# Patient Record
Sex: Female | Born: 1965
Health system: Southern US, Community
[De-identification: ages and names within clinical notes are randomized; demographics above are authoritative.]

## PROBLEM LIST (undated history)

## (undated) DIAGNOSIS — K909 Intestinal malabsorption, unspecified: Secondary | ICD-10-CM

## (undated) DIAGNOSIS — J189 Pneumonia, unspecified organism: Secondary | ICD-10-CM

## (undated) DIAGNOSIS — Z923 Personal history of irradiation: Secondary | ICD-10-CM

## (undated) DIAGNOSIS — K219 Gastro-esophageal reflux disease without esophagitis: Secondary | ICD-10-CM

## (undated) DIAGNOSIS — N83209 Unspecified ovarian cyst, unspecified side: Secondary | ICD-10-CM

## (undated) DIAGNOSIS — I10 Essential (primary) hypertension: Secondary | ICD-10-CM

## (undated) DIAGNOSIS — C169 Malignant neoplasm of stomach, unspecified: Secondary | ICD-10-CM

## (undated) DIAGNOSIS — Z7969 Long term (current) use of other immunomodulators and immunosuppressants: Secondary | ICD-10-CM

## (undated) DIAGNOSIS — Z8719 Personal history of other diseases of the digestive system: Secondary | ICD-10-CM

## (undated) DIAGNOSIS — Z79899 Other long term (current) drug therapy: Secondary | ICD-10-CM

## (undated) DIAGNOSIS — Z972 Presence of dental prosthetic device (complete) (partial): Secondary | ICD-10-CM

## (undated) DIAGNOSIS — D500 Iron deficiency anemia secondary to blood loss (chronic): Secondary | ICD-10-CM

## (undated) DIAGNOSIS — K2270 Barrett's esophagus without dysplasia: Secondary | ICD-10-CM

## (undated) DIAGNOSIS — F419 Anxiety disorder, unspecified: Secondary | ICD-10-CM

## (undated) DIAGNOSIS — M940 Chondrocostal junction syndrome [Tietze]: Secondary | ICD-10-CM

## (undated) DIAGNOSIS — A159 Respiratory tuberculosis unspecified: Secondary | ICD-10-CM

## (undated) DIAGNOSIS — Z809 Family history of malignant neoplasm, unspecified: Secondary | ICD-10-CM

## (undated) DIAGNOSIS — R011 Cardiac murmur, unspecified: Secondary | ICD-10-CM

## (undated) DIAGNOSIS — R51 Headache: Secondary | ICD-10-CM

## (undated) DIAGNOSIS — I2699 Other pulmonary embolism without acute cor pulmonale: Secondary | ICD-10-CM

## (undated) DIAGNOSIS — M1990 Unspecified osteoarthritis, unspecified site: Secondary | ICD-10-CM

## (undated) HISTORY — PX: GASTROSTOMY: SHX151

## (undated) HISTORY — DX: Unspecified ovarian cyst, unspecified side: N83.209

## (undated) HISTORY — DX: Family history of malignant neoplasm, unspecified: Z80.9

## (undated) HISTORY — DX: Chondrocostal junction syndrome (tietze): M94.0

## (undated) HISTORY — PX: TONSILLECTOMY: SUR1361

## (undated) HISTORY — DX: Barrett's esophagus without dysplasia: K22.70

## (undated) HISTORY — PX: UPPER GASTROINTESTINAL ENDOSCOPY: SHX188

## (undated) HISTORY — PX: ABDOMINAL HYSTERECTOMY: SHX81

## (undated) HISTORY — DX: Essential (primary) hypertension: I10

## (undated) HISTORY — PX: OOPHORECTOMY: SHX86

## (undated) HISTORY — DX: Gastro-esophageal reflux disease without esophagitis: K21.9

## (undated) HISTORY — DX: Headache: R51

## (undated) HISTORY — PX: TUBAL LIGATION: SHX77

## (undated) HISTORY — DX: Anxiety disorder, unspecified: F41.9

## (undated) HISTORY — DX: Intestinal malabsorption, unspecified: K90.9

## (undated) HISTORY — DX: Iron deficiency anemia secondary to blood loss (chronic): D50.0

---

## 1998-04-23 ENCOUNTER — Emergency Department (HOSPITAL_COMMUNITY): Admission: EM | Admit: 1998-04-23 | Discharge: 1998-04-23 | Payer: Self-pay | Admitting: Emergency Medicine

## 1998-05-18 ENCOUNTER — Ambulatory Visit: Admission: RE | Admit: 1998-05-18 | Discharge: 1998-05-18 | Payer: Self-pay | Admitting: *Deleted

## 1998-05-18 ENCOUNTER — Encounter: Payer: Self-pay | Admitting: *Deleted

## 2009-02-21 DIAGNOSIS — R51 Headache: Secondary | ICD-10-CM

## 2009-02-21 HISTORY — DX: Headache: R51

## 2011-12-27 ENCOUNTER — Encounter: Payer: Self-pay | Admitting: Cardiology

## 2012-01-20 ENCOUNTER — Encounter: Payer: Self-pay | Admitting: *Deleted

## 2012-01-23 ENCOUNTER — Encounter: Payer: Self-pay | Admitting: Cardiology

## 2012-01-23 ENCOUNTER — Ambulatory Visit (INDEPENDENT_AMBULATORY_CARE_PROVIDER_SITE_OTHER): Payer: Self-pay | Admitting: Cardiology

## 2012-01-23 VITALS — BP 118/72 | HR 66 | Ht 60.0 in | Wt 161.0 lb

## 2012-01-23 DIAGNOSIS — R011 Cardiac murmur, unspecified: Secondary | ICD-10-CM

## 2012-01-23 DIAGNOSIS — I10 Essential (primary) hypertension: Secondary | ICD-10-CM

## 2012-01-23 DIAGNOSIS — R079 Chest pain, unspecified: Secondary | ICD-10-CM

## 2012-01-23 NOTE — Assessment & Plan Note (Signed)
Symptoms atypical and not consistent with cardiac pain. No further ischemia evaluation. We will obtain records from [facility] concerning recent admission and stress test. Symptoms are most consistent with costochondritis. I have recommended a short course of anti-inflammatory and followup with her primary care.

## 2012-01-23 NOTE — Patient Instructions (Addendum)
Your physician recommends that you schedule a follow-up appointment in: AS NEEDED PENDING TEST RESULTS  Your physician has requested that you have an echocardiogram. Echocardiography is a painless test that uses sound waves to create images of your heart. It provides your doctor with information about the size and shape of your heart and how well your heart's chambers and valves are working. This procedure takes approximately one hour. There are no restrictions for this procedure.    

## 2012-01-23 NOTE — Assessment & Plan Note (Signed)
Blood pressure controlled. Continue present medications. 

## 2012-01-23 NOTE — Assessment & Plan Note (Signed)
Probable flow murmur. Schedule echo.

## 2012-01-23 NOTE — Progress Notes (Signed)
HPI: 46 year old female with no prior cardiac history for evaluation of chest pain. Patient has had occasional chest pain since 1999. Previous episodes were attributed to anxiety by her report. In October she was admitted to [facility] with chest pain. Patient apparently ruled out and had a negative stress test but records are not available. Her pain is in the left upper chest with radiation to her axillary area bilaterally. It increases when she wears a tight fitting bra. It improves with pain medications. He can last an hour at a time. It is not related to position or exertion. She has some dyspnea on exertion. Because of the above we were asked to evaluate.  Current Outpatient Prescriptions  Medication Sig Dispense Refill  . acetaminophen (TYLENOL) 325 MG tablet Take 650 mg by mouth every 6 (six) hours as needed.      Marland Kitchen aspirin 81 MG tablet Take 81 mg by mouth daily.      . carvedilol (COREG) 6.25 MG tablet Take 6.25 mg by mouth 2 (two) times daily with a meal.      . naproxen (NAPROSYN) 500 MG tablet Take 500 mg by mouth 2 (two) times daily with a meal.      . omeprazole (PRILOSEC) 40 MG capsule Take 40 mg by mouth daily.      . SUMAtriptan Succinate (SUMAVEL DOSEPRO) 6 MG/0.5ML DEVI Inject into the skin.        No Known Allergies  Past Medical History  Diagnosis Date  . Headache 2011    Migraines  . Esophageal reflux     On omeprazole  . Costochondritis   . Hypertension     started around 2011    Past Surgical History  Procedure Date  . Oophorectomy     Around 1985 left ovary removal  . Tonsillectomy     Around 1994  . Tubal ligation     History   Social History  . Marital Status: Married    Spouse Name: N/A    Number of Children: 2  . Years of Education: N/A   Occupational History  .  [employer]   Social History Main Topics  . Smoking status: Former Games developer  . Smokeless tobacco: Not on file  . Alcohol Use: Yes     Comment: Occasional  .  Drug Use: Not on file  . Sexually Active: Not on file   Other Topics Concern  . Not on file   Social History Narrative  . No narrative on file    Family History  Problem Relation Age of Onset  . Hypertension Mother   . Diabetes Mellitus II Mother   . Hypertension Sister   . Hypertension Brother   . Heart failure Maternal Grandmother   . Hypertension Maternal Grandfather     ROS: no fevers or chills, productive cough, hemoptysis, dysphasia, odynophagia, melena, hematochezia, dysuria, hematuria, rash, seizure activity, orthopnea, PND, pedal edema, claudication. Remaining systems are negative.  Physical Exam:   Blood pressure 118/72, pulse 66, height 5' (1.524 m), weight 161 lb (73.029 kg), SpO2 99.00%.  General:  Well developed/well nourished in NAD Skin warm/dry Patient not depressed No peripheral clubbing Back-normal HEENT-normal/normal eyelids Neck supple/normal carotid upstroke bilaterally; no bruits; no JVD; no thyromegaly chest - CTA/ normal expansion CV - RRR/normal S1 and S2; no rubs or gallops;  PMI nondisplaced, 1/6 systolic ejection murmur Abdomen -NT/ND, no HSM, no mass, + bowel sounds, no bruit 2+ femoral pulses, no bruits Ext-no edema, chords, 2+  DP Neuro-grossly nonfocal  ECG sinus rhythm at a rate of 64. Left ventricular hypertrophy. No ST changes.

## 2012-02-01 ENCOUNTER — Ambulatory Visit (HOSPITAL_COMMUNITY): Payer: No Typology Code available for payment source | Attending: Cardiovascular Disease | Admitting: Radiology

## 2012-02-01 DIAGNOSIS — I10 Essential (primary) hypertension: Secondary | ICD-10-CM | POA: Insufficient documentation

## 2012-02-01 DIAGNOSIS — I059 Rheumatic mitral valve disease, unspecified: Secondary | ICD-10-CM | POA: Insufficient documentation

## 2012-02-01 DIAGNOSIS — R0989 Other specified symptoms and signs involving the circulatory and respiratory systems: Secondary | ICD-10-CM | POA: Insufficient documentation

## 2012-02-01 DIAGNOSIS — R011 Cardiac murmur, unspecified: Secondary | ICD-10-CM

## 2012-02-01 DIAGNOSIS — R0609 Other forms of dyspnea: Secondary | ICD-10-CM | POA: Insufficient documentation

## 2012-02-01 DIAGNOSIS — I079 Rheumatic tricuspid valve disease, unspecified: Secondary | ICD-10-CM | POA: Insufficient documentation

## 2012-02-01 NOTE — Progress Notes (Signed)
Echocardiogram performed.  

## 2012-05-31 ENCOUNTER — Encounter: Payer: Self-pay | Admitting: Obstetrics and Gynecology

## 2012-05-31 ENCOUNTER — Ambulatory Visit (INDEPENDENT_AMBULATORY_CARE_PROVIDER_SITE_OTHER): Payer: No Typology Code available for payment source | Admitting: Obstetrics and Gynecology

## 2012-05-31 VITALS — BP 124/85 | HR 78 | Temp 99.5°F | Ht 60.0 in | Wt 159.7 lb

## 2012-05-31 DIAGNOSIS — N83202 Unspecified ovarian cyst, left side: Secondary | ICD-10-CM

## 2012-05-31 DIAGNOSIS — N83209 Unspecified ovarian cyst, unspecified side: Secondary | ICD-10-CM

## 2012-05-31 NOTE — Progress Notes (Signed)
Patient ID: Paula [name], female   DOB: [birth year], 47 y.o.   MRN: [id number] 47 yo G3P2012 with LMP 05/28/2012 [facility] for evaluation of 55 mm left ovarian cystic lesion seen on CT scan in March 2014. Patient reports that at the time of CT she was having generalized pain. She is currently not complaining of any pain other than cramping pain associated with her current cycle. Patient would like this cyst to be evaluated in order to rule out cancer.   Past Medical History  Diagnosis Date  . Headache 2011    Migraines  . Esophageal reflux     On omeprazole  . Costochondritis   . Hypertension     started around 2011  . Ovarian cyst    Past Surgical History  Procedure Laterality Date  . Oophorectomy      Around 1985 left ovary removal  . Tonsillectomy      Around 1994  . Tubal ligation     Family History  Problem Relation Age of Onset  . Hypertension Mother   . Diabetes Mellitus II Mother   . Hypertension Sister   . Hypertension Brother   . Heart failure Maternal Grandmother   . Hypertension Maternal Grandfather    History  Substance Use Topics  . Smoking status: Former Games developer  . Smokeless tobacco: Never Used     Comment: Stopped 2004  . Alcohol Use: Yes     Comment: Occasional red wine    GENERAL: Well-developed, well-nourished female in no acute distress.  ABDOMEN: Soft, nontender, nondistended. No organomegaly. PELVIC: Declined by patient as she is on her cycle EXTREMITIES: No cyanosis, clubbing, or edema, 2+ distal pulses.  A/P 47 yo with left ovarian cyst - Will order pelvic ultrasound for better evaluation of adnexa - patient will be contacted with any abnormal results  - Follow up in 2 weeks for further management if indicated

## 2012-05-31 NOTE — Patient Instructions (Signed)
Ovarian Cyst An ovarian cyst is a sac filled with fluid or blood. This sac is attached to the ovary. Some cysts go away on their own. Other cysts need treatment.  HOME CARE   Only take medicine as told by your doctor.  Follow up with your doctor as told. GET HELP RIGHT AWAY IF:   You develop sudden pain.  Your belly (abdomen) becomes large or puffy (swollen).  You have a hard time peeing (totally emptying your bladder).  You feel sick most of the time.  You have a temperature by mouth above 100.4 F (38 C), not controlled by medicine.  Your periods are late, not regular, or painful.  Your belly or pelvic pain does not go away.  You have pressure on your bladder.  You have pain during sex.  You feel fullness, pressure, or discomfort in your belly.  You lose weight for no reason. MAKE SURE YOU:   Understand these instructions.  Will watch your condition.  Will get help right away if you are not doing well or get worse. Document Released: 07/27/2007 Document Revised: 05/02/2011 Document Reviewed: 01/09/2009 [facility] Patient Information 2013 [place], [place].

## 2012-06-05 ENCOUNTER — Ambulatory Visit (HOSPITAL_COMMUNITY)
Admission: RE | Admit: 2012-06-05 | Discharge: 2012-06-05 | Disposition: A | Discharge status: Home / Self Care | Payer: No Typology Code available for payment source | Source: Ambulatory Visit | Attending: Obstetrics and Gynecology | Admitting: Obstetrics and Gynecology

## 2012-06-05 DIAGNOSIS — N83202 Unspecified ovarian cyst, left side: Secondary | ICD-10-CM

## 2012-06-05 DIAGNOSIS — N9489 Other specified conditions associated with female genital organs and menstrual cycle: Secondary | ICD-10-CM | POA: Insufficient documentation

## 2012-06-05 DIAGNOSIS — N854 Malposition of uterus: Secondary | ICD-10-CM | POA: Insufficient documentation

## 2012-06-05 DIAGNOSIS — D251 Intramural leiomyoma of uterus: Secondary | ICD-10-CM | POA: Insufficient documentation

## 2012-06-06 ENCOUNTER — Telehealth: Payer: Self-pay | Admitting: Obstetrics and Gynecology

## 2012-06-06 NOTE — Telephone Encounter (Addendum)
Message copied by Toula Moos on Wed Jun 06, 2012 12:47 PM   ------ Called patient; no answer; left message to call clinic back.       Message from: CONSTANT, PEGGY      Created: Tue Jun 05, 2012  3:12 PM       Please inform patient of ultrasound result which does not demonstrate the presence of a cyst. Her uterus is of normal size with a small 2 cm fibroid for which no intervention is necessary.            Peggy ------

## 2012-06-06 NOTE — Telephone Encounter (Signed)
Patient returned call. Pt informed of results.

## 2013-01-24 ENCOUNTER — Ambulatory Visit: Payer: Self-pay | Admitting: Cardiology

## 2013-02-15 ENCOUNTER — Encounter: Payer: Self-pay | Admitting: Cardiology

## 2013-02-15 ENCOUNTER — Encounter (INDEPENDENT_AMBULATORY_CARE_PROVIDER_SITE_OTHER): Payer: Self-pay

## 2013-02-15 ENCOUNTER — Ambulatory Visit (INDEPENDENT_AMBULATORY_CARE_PROVIDER_SITE_OTHER): Payer: No Typology Code available for payment source | Admitting: Cardiology

## 2013-02-15 VITALS — BP 122/72 | HR 68 | Ht 60.0 in | Wt 168.0 lb

## 2013-02-15 DIAGNOSIS — I10 Essential (primary) hypertension: Secondary | ICD-10-CM

## 2013-02-15 DIAGNOSIS — I059 Rheumatic mitral valve disease, unspecified: Secondary | ICD-10-CM

## 2013-02-15 DIAGNOSIS — R079 Chest pain, unspecified: Secondary | ICD-10-CM

## 2013-02-15 DIAGNOSIS — I340 Nonrheumatic mitral (valve) insufficiency: Secondary | ICD-10-CM | POA: Insufficient documentation

## 2013-02-15 DIAGNOSIS — R011 Cardiac murmur, unspecified: Secondary | ICD-10-CM

## 2013-02-15 NOTE — Assessment & Plan Note (Signed)
Plan repeat echocardiogram. 

## 2013-02-15 NOTE — Assessment & Plan Note (Signed)
No further symptoms. Previous nuclear study negative. 

## 2013-02-15 NOTE — Assessment & Plan Note (Signed)
Blood pressure controlled. Continue present medications. 

## 2013-02-15 NOTE — Patient Instructions (Signed)

## 2013-02-15 NOTE — Progress Notes (Signed)
      HPI: FU chest pain. Patient has had occasional chest pain since 1999. Previous episodes were attributed to anxiety by her report. Nuclear study in October 2013 showed an ejection fraction of 74% and normal perfusion. Echocardiogram in December of 2013 showed normal LV function and moderate mitral regurgitation. Since I last saw her the patient denies any dyspnea on exertion, orthopnea, PND, pedal edema, palpitations, syncope or chest pain.    Current Outpatient Prescriptions  Medication Sig Dispense Refill  . aspirin 81 MG tablet Take 81 mg by mouth daily.      . carvedilol (COREG) 6.25 MG tablet Take 6.25 mg by mouth 2 (two) times daily with a meal.      . omeprazole (PRILOSEC) 40 MG capsule Take 40 mg by mouth daily.      . SUMAtriptan Succinate (SUMAVEL DOSEPRO) 6 MG/0.5ML DEVI Inject into the skin.       No current facility-administered medications for this visit.     Past Medical History  Diagnosis Date  . Headache(784.0) 2011    Migraines  . Esophageal reflux     On omeprazole  . Costochondritis   . Hypertension     started around 2011  . Ovarian cyst     Past Surgical History  Procedure Laterality Date  . Oophorectomy      Around 1985 left ovary removal  . Tonsillectomy      Around 1994  . Tubal ligation      History   Social History  . Marital Status: Married    Spouse Name: N/A    Number of Children: 2  . Years of Education: N/A   Occupational History  .  [employer]   Social History Main Topics  . Smoking status: Former Games developer  . Smokeless tobacco: Never Used     Comment: Stopped 2004  . Alcohol Use: Yes     Comment: Occasional red wine  . Drug Use: No  . Sexual Activity: Yes    Birth Control/ Protection: Surgical   Other Topics Concern  . Not on file   Social History Narrative  . No narrative on file    ROS: no fevers or chills, productive cough, hemoptysis, dysphasia, odynophagia, melena, hematochezia, dysuria, hematuria,  rash, seizure activity, orthopnea, PND, pedal edema, claudication. Remaining systems are negative.  Physical Exam: Well-developed well-nourished in no acute distress.  Skin is warm and dry.  HEENT is normal.  Neck is supple.  Chest is clear to auscultation with normal expansion.  Cardiovascular exam is regular rate and rhythm.  Abdominal exam nontender or distended. No masses palpated. Extremities show no edema. neuro grossly intact  ECG sinus rhythm at a rate of 68. Nonspecific ST changes.

## 2013-02-21 DIAGNOSIS — I2699 Other pulmonary embolism without acute cor pulmonale: Secondary | ICD-10-CM

## 2013-02-21 HISTORY — DX: Other pulmonary embolism without acute cor pulmonale: I26.99

## 2013-03-05 ENCOUNTER — Encounter: Payer: Self-pay | Admitting: Cardiovascular Disease

## 2013-03-05 ENCOUNTER — Ambulatory Visit (HOSPITAL_COMMUNITY): Payer: No Typology Code available for payment source | Attending: Cardiology | Admitting: Radiology

## 2013-03-05 DIAGNOSIS — I340 Nonrheumatic mitral (valve) insufficiency: Secondary | ICD-10-CM

## 2013-03-05 DIAGNOSIS — E669 Obesity, unspecified: Secondary | ICD-10-CM | POA: Insufficient documentation

## 2013-03-05 DIAGNOSIS — Z6833 Body mass index (BMI) 33.0-33.9, adult: Secondary | ICD-10-CM | POA: Insufficient documentation

## 2013-03-05 DIAGNOSIS — I059 Rheumatic mitral valve disease, unspecified: Secondary | ICD-10-CM | POA: Insufficient documentation

## 2013-03-05 DIAGNOSIS — I079 Rheumatic tricuspid valve disease, unspecified: Secondary | ICD-10-CM | POA: Insufficient documentation

## 2013-03-05 DIAGNOSIS — R011 Cardiac murmur, unspecified: Secondary | ICD-10-CM

## 2013-03-05 DIAGNOSIS — I10 Essential (primary) hypertension: Secondary | ICD-10-CM | POA: Insufficient documentation

## 2013-03-05 DIAGNOSIS — Z87891 Personal history of nicotine dependence: Secondary | ICD-10-CM | POA: Insufficient documentation

## 2013-03-05 DIAGNOSIS — R079 Chest pain, unspecified: Secondary | ICD-10-CM

## 2013-03-05 NOTE — Progress Notes (Signed)
Echocardiogram performed.  

## 2013-07-23 ENCOUNTER — Emergency Department (HOSPITAL_BASED_OUTPATIENT_CLINIC_OR_DEPARTMENT_OTHER)
Admission: EM | Admit: 2013-07-23 | Discharge: 2013-07-24 | Disposition: A | Discharge status: Home / Self Care | Payer: No Typology Code available for payment source | Attending: Emergency Medicine | Admitting: Emergency Medicine

## 2013-07-23 ENCOUNTER — Emergency Department (HOSPITAL_BASED_OUTPATIENT_CLINIC_OR_DEPARTMENT_OTHER): Payer: No Typology Code available for payment source

## 2013-07-23 ENCOUNTER — Encounter (HOSPITAL_BASED_OUTPATIENT_CLINIC_OR_DEPARTMENT_OTHER): Payer: Self-pay | Admitting: Emergency Medicine

## 2013-07-23 DIAGNOSIS — I10 Essential (primary) hypertension: Secondary | ICD-10-CM | POA: Diagnosis not present

## 2013-07-23 DIAGNOSIS — M25559 Pain in unspecified hip: Secondary | ICD-10-CM | POA: Insufficient documentation

## 2013-07-23 DIAGNOSIS — G8911 Acute pain due to trauma: Secondary | ICD-10-CM | POA: Diagnosis not present

## 2013-07-23 DIAGNOSIS — Z8742 Personal history of other diseases of the female genital tract: Secondary | ICD-10-CM | POA: Insufficient documentation

## 2013-07-23 DIAGNOSIS — Z87891 Personal history of nicotine dependence: Secondary | ICD-10-CM | POA: Diagnosis not present

## 2013-07-23 DIAGNOSIS — M79609 Pain in unspecified limb: Secondary | ICD-10-CM | POA: Diagnosis present

## 2013-07-23 DIAGNOSIS — G43909 Migraine, unspecified, not intractable, without status migrainosus: Secondary | ICD-10-CM | POA: Insufficient documentation

## 2013-07-23 DIAGNOSIS — K219 Gastro-esophageal reflux disease without esophagitis: Secondary | ICD-10-CM | POA: Insufficient documentation

## 2013-07-23 DIAGNOSIS — M545 Low back pain, unspecified: Secondary | ICD-10-CM | POA: Diagnosis not present

## 2013-07-23 DIAGNOSIS — M25469 Effusion, unspecified knee: Secondary | ICD-10-CM | POA: Diagnosis not present

## 2013-07-23 DIAGNOSIS — M25569 Pain in unspecified knee: Secondary | ICD-10-CM | POA: Insufficient documentation

## 2013-07-23 DIAGNOSIS — Z7982 Long term (current) use of aspirin: Secondary | ICD-10-CM | POA: Diagnosis not present

## 2013-07-23 DIAGNOSIS — M546 Pain in thoracic spine: Secondary | ICD-10-CM | POA: Diagnosis not present

## 2013-07-23 DIAGNOSIS — M25562 Pain in left knee: Secondary | ICD-10-CM

## 2013-07-23 DIAGNOSIS — M25462 Effusion, left knee: Secondary | ICD-10-CM

## 2013-07-23 MED ORDER — HYDROCODONE-ACETAMINOPHEN 5-325 MG PO TABS
2.0000 | ORAL_TABLET | Freq: Once | ORAL | Status: AC
Start: 2013-07-23 — End: 2013-07-23
  Administered 2013-07-23: 2 via ORAL
  Filled 2013-07-23: qty 2

## 2013-07-23 MED ORDER — IBUPROFEN 800 MG PO TABS
800.0000 mg | ORAL_TABLET | Freq: Once | ORAL | Status: AC
  Administered 2013-07-23: 800 mg via ORAL
  Filled 2013-07-23: qty 1

## 2013-07-23 MED ORDER — HYDROCODONE-ACETAMINOPHEN 5-325 MG PO TABS: 1.0000 | ORAL_TABLET | ORAL | Status: DC | PRN

## 2013-07-23 NOTE — Discharge Instructions (Signed)
Take ibuprofen every 6 hours as needed for pain and use ice 3-4 times a day. Elevate your legs regularly. See a physician if you develop pain posteriorly in the back of your calf other than where your lacerations were, shortness of breath or worsening symptoms. For severe pain take norco or vicodin however realize they have the potential for addiction and it can make you sleepy and has tylenol in it.  No operating machinery while taking.  If you were given medicines take as directed.  If you are on coumadin or contraceptives realize their levels and effectiveness is altered by many different medicines.  If you have any reaction (rash, tongues swelling, other) to the medicines stop taking and see a physician.   Please follow up as directed and return to the ER or see a physician for new or worsening symptoms.  Thank you. Filed Vitals:   07/23/13 2153  BP: 168/91  Pulse: 89  Temp: 98.8 F (37.1 C)  TempSrc: Oral  Resp: 20  Height: 5' (1.524 m)  Weight: 175 lb (79.379 kg)  SpO2: 100%
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
## 2013-07-23 NOTE — ED Notes (Signed)
MD at bedside. 
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
## 2013-07-23 NOTE — ED Notes (Signed)
Notes from [facility] ed received given to MD

## 2013-07-23 NOTE — ED Notes (Signed)
Patient transported to CT 
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
## 2013-07-25 ENCOUNTER — Encounter: Payer: Self-pay | Admitting: Family Medicine

## 2013-07-25 ENCOUNTER — Ambulatory Visit (INDEPENDENT_AMBULATORY_CARE_PROVIDER_SITE_OTHER): Payer: No Typology Code available for payment source | Admitting: Family Medicine

## 2013-07-25 VITALS — BP 145/83 | HR 75 | Ht 61.0 in | Wt 170.0 lb

## 2013-07-25 DIAGNOSIS — M545 Low back pain, unspecified: Secondary | ICD-10-CM

## 2013-07-25 DIAGNOSIS — S8990XA Unspecified injury of unspecified lower leg, initial encounter: Secondary | ICD-10-CM

## 2013-07-25 DIAGNOSIS — S99919A Unspecified injury of unspecified ankle, initial encounter: Secondary | ICD-10-CM

## 2013-07-25 DIAGNOSIS — S99929A Unspecified injury of unspecified foot, initial encounter: Secondary | ICD-10-CM

## 2013-07-25 DIAGNOSIS — S8992XA Unspecified injury of left lower leg, initial encounter: Secondary | ICD-10-CM

## 2013-07-25 MED ORDER — CYCLOBENZAPRINE HCL 10 MG PO TABS: 10.0000 mg | ORAL_TABLET | Freq: Three times a day (TID) | ORAL | Status: DC | PRN

## 2013-07-25 MED ORDER — IBUPROFEN 600 MG PO TABS: 600.0000 mg | ORAL_TABLET | Freq: Three times a day (TID) | ORAL | Status: DC | PRN

## 2013-07-25 MED ORDER — HYDROCODONE-ACETAMINOPHEN 5-325 MG PO TABS: 1.0000 | ORAL_TABLET | Freq: Four times a day (QID) | ORAL | Status: DC | PRN

## 2013-07-25 NOTE — Patient Instructions (Signed)
Take ibuprofen 600mg  three times a day regularly with food for pain and inflammation. Ice knee 15 minutes at a time 3-4 times a day. Prop leg up above level of your heart as much as possible. When you can start using ACE wrap for compression. Crutches as needed. Norco as needed for severe pain (no driving on this). Flexeril as needed for muscle spasms (this can make you drowsy - if so, don't drive on this medication either). Follow up with me in 2 weeks for reevaluation.

## 2013-07-30 ENCOUNTER — Encounter: Payer: Self-pay | Admitting: Family Medicine

## 2013-07-30 DIAGNOSIS — S8992XA Unspecified injury of left lower leg, initial encounter: Secondary | ICD-10-CM | POA: Insufficient documentation

## 2013-07-30 DIAGNOSIS — M545 Low back pain, unspecified: Secondary | ICD-10-CM | POA: Insufficient documentation

## 2013-07-30 NOTE — Assessment & Plan Note (Signed)
2/2 lumbar strain.  Norco, flexeril, ibuprofen, ice or heat.  Anticipate starting physical therapy for this at follow-up.  F/u in 2 weeks.
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
## 2013-07-30 NOTE — Assessment & Plan Note (Signed)
imaging including radiographs and CT negative.  Advised we start with conservative treatment, repeat evaluation in 2 weeks.  Icing, elevation, ACE wrap when pain starts to resolve.  Crutches.  Norco, flexeril as needed.

## 2013-07-31 ENCOUNTER — Emergency Department (HOSPITAL_BASED_OUTPATIENT_CLINIC_OR_DEPARTMENT_OTHER)
Admission: EM | Admit: 2013-07-31 | Discharge: 2013-07-31 | Disposition: A | Discharge status: Home / Self Care | Payer: No Typology Code available for payment source | Attending: Emergency Medicine | Admitting: Emergency Medicine

## 2013-07-31 ENCOUNTER — Encounter (HOSPITAL_BASED_OUTPATIENT_CLINIC_OR_DEPARTMENT_OTHER): Payer: Self-pay | Admitting: Emergency Medicine

## 2013-07-31 DIAGNOSIS — Z8742 Personal history of other diseases of the female genital tract: Secondary | ICD-10-CM | POA: Insufficient documentation

## 2013-07-31 DIAGNOSIS — Z87891 Personal history of nicotine dependence: Secondary | ICD-10-CM | POA: Diagnosis not present

## 2013-07-31 DIAGNOSIS — M940 Chondrocostal junction syndrome [Tietze]: Secondary | ICD-10-CM | POA: Diagnosis not present

## 2013-07-31 DIAGNOSIS — IMO0001 Reserved for inherently not codable concepts without codable children: Secondary | ICD-10-CM | POA: Diagnosis not present

## 2013-07-31 DIAGNOSIS — M25469 Effusion, unspecified knee: Secondary | ICD-10-CM | POA: Insufficient documentation

## 2013-07-31 DIAGNOSIS — Z7982 Long term (current) use of aspirin: Secondary | ICD-10-CM | POA: Insufficient documentation

## 2013-07-31 DIAGNOSIS — Z4802 Encounter for removal of sutures: Secondary | ICD-10-CM | POA: Diagnosis not present

## 2013-07-31 DIAGNOSIS — I10 Essential (primary) hypertension: Secondary | ICD-10-CM | POA: Insufficient documentation

## 2013-07-31 DIAGNOSIS — Z79899 Other long term (current) drug therapy: Secondary | ICD-10-CM | POA: Diagnosis not present

## 2013-07-31 DIAGNOSIS — K219 Gastro-esophageal reflux disease without esophagitis: Secondary | ICD-10-CM | POA: Diagnosis not present

## 2013-07-31 DIAGNOSIS — G43909 Migraine, unspecified, not intractable, without status migrainosus: Secondary | ICD-10-CM | POA: Diagnosis not present

## 2013-07-31 LAB — CBG MONITORING, ED: Glucose-Capillary: 356 mg/dL — ABNORMAL HIGH (ref 70–99)

## 2013-07-31 MED ORDER — LIDOCAINE-EPINEPHRINE-TETRACAINE (LET) SOLUTION
3.0000 mL | Freq: Once | NASAL | Status: AC
  Administered 2013-07-31: 3 mL via TOPICAL
  Filled 2013-07-31: qty 3

## 2013-07-31 NOTE — ED Provider Notes (Signed)
CSN: [id number]     Arrival date & time 07/31/13  1949 History   First MD Initiated Contact with Patient 07/31/13 1956     Chief Complaint  Patient presents with  . Suture / Staple Removal     (Consider location/radiation/quality/duration/timing/severity/associated sxs/prior Treatment) Patient is a 48 y.o. female presenting with suture removal. The history is provided by the patient. No language interpreter was used.  Suture / Staple Removal This is a new problem. The current episode started today. The problem occurs constantly. The problem has been unchanged. Associated symptoms include joint swelling and myalgias. Nothing aggravates the symptoms. She has tried nothing for the symptoms. The treatment provided moderate relief.   Pt seen by Dr. Barbaraann Barthel for knee swelling.  Pt has had sutures for 14 days.   Pt request suture removal. Past Medical History  Diagnosis Date  . Headache(784.0) 2011    Migraines  . Esophageal reflux     On omeprazole  . Costochondritis   . Hypertension     started around 2011  . Ovarian cyst    Past Surgical History  Procedure Laterality Date  . Oophorectomy      Around 1985 left ovary removal  . Tonsillectomy      Around 1994  . Tubal ligation     Family History  Problem Relation Age of Onset  . Hypertension Mother   . Diabetes Mellitus II Mother   . Hypertension Sister   . Hypertension Brother   . Heart failure Maternal Grandmother   . Hypertension Maternal Grandfather    History  Substance Use Topics  . Smoking status: Former Research scientist (life sciences)  . Smokeless tobacco: Never Used     Comment: Stopped 2004  . Alcohol Use: Yes     Comment: occ   OB History   Grav Para Term Preterm Abortions TAB SAB Ect Mult Living                 Review of Systems  Musculoskeletal: Positive for joint swelling and myalgias.  Skin: Positive for wound.  All other systems reviewed and are negative.     Allergies  Review of patient's allergies indicates no known  allergies.  Home Medications   Prior to Admission medications   Medication Sig Start Date End Date Taking? Authorizing Provider  aspirin 81 MG tablet Take 81 mg by mouth daily.    Historical Provider, MD  carvedilol (COREG) 6.25 MG tablet Take 6.25 mg by mouth 2 (two) times daily with a meal.    Historical Provider, MD  cephALEXin (KEFLEX) 500 MG capsule  07/19/13   Historical Provider, MD  cyclobenzaprine (FLEXERIL) 10 MG tablet Take 1 tablet (10 mg total) by mouth every 8 (eight) hours as needed for muscle spasms. 07/25/13   Dene Gentry, MD  HYDROcodone-acetaminophen (NORCO) 5-325 MG per tablet Take 1 tablet by mouth every 6 (six) hours as needed. 07/25/13   Dene Gentry, MD  ibuprofen (ADVIL,MOTRIN) 600 MG tablet Take 1 tablet (600 mg total) by mouth every 8 (eight) hours as needed. 07/25/13   Dene Gentry, MD  omeprazole (PRILOSEC) 40 MG capsule Take 40 mg by mouth daily.    Historical Provider, MD  SUMAtriptan Succinate (SUMAVEL DOSEPRO) 6 MG/0.5ML DEVI Inject into the skin.    Historical Provider, MD   LMP 07/15/2013 Physical Exam  Nursing note and vitals reviewed. Constitutional: She is oriented to person, place, and time. She appears well-developed and well-nourished.  Musculoskeletal:  Laceration left  lower  leg, well healed,  No infection,   Effusion left knee  Neurological: She is alert and oriented to person, place, and time. She has normal reflexes.  Skin: Skin is warm.  Psychiatric: She has a normal mood and affect.    ED Course  Procedures (including critical care time) Labs Review Labs Reviewed  CBG MONITORING, ED - Abnormal; Notable for the following:    Glucose-Capillary 356 (*)    All other components within normal limits    Imaging Review No results found.   EKG Interpretation None      MDM   Final diagnoses:  Visit for suture removal    Sutures difficult to remove,  Let to area,  Sutures removed with 11 blade    Fransico Meadow,  PA-C 07/31/13 2136  [place], PA-C 07/31/13 2136
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
## 2013-07-31 NOTE — ED Notes (Signed)
Pt here for  Suture removal

## 2013-07-31 NOTE — Discharge Instructions (Signed)
Suture Removal, Care After Refer to this sheet in the next few weeks. These instructions provide you with information on caring for yourself after your procedure. Your health care provider may also give you more specific instructions. Your treatment has been planned according to current medical practices, but problems sometimes occur. Call your health care provider if you have any problems or questions after your procedure. WHAT TO EXPECT AFTER THE PROCEDURE After your stitches (sutures) are removed, it is typical to have the following:  Some discomfort and swelling in the wound area.  Slight redness in the area. HOME CARE INSTRUCTIONS   If you have skin adhesive strips over the wound area, do not take the strips off. They will fall off on their own in a few days. If the strips remain in place after 14 days, you may remove them.  Change any bandages (dressings) at least once a day or as directed by your health care provider. If the bandage sticks, soak it off with warm, soapy water.  Apply cream or ointment only as directed by your health care provider. If using cream or ointment, wash the area with soap and water 2 times a day to remove all the cream or ointment. Rinse off the soap and pat the area dry with a clean towel.  Keep the wound area dry and clean. If the bandage becomes wet or dirty, or if it develops a bad smell, change it as soon as possible.  Continue to protect the wound from injury.  Use sunscreen when out in the sun. New scars become sunburned easily. SEEK MEDICAL CARE IF:  You have increasing redness, swelling, or pain in the wound.  You see pus coming from the wound.  You have a fever.  You notice a bad smell coming from the wound or dressing.  Your wound breaks open (edges not staying together). Document Released: 11/02/2000 Document Revised: 11/28/2012 Document Reviewed: 09/19/2012 Exit[employer] Patient Information 2014 [employer].  

## 2013-08-01 NOTE — ED Provider Notes (Signed)
Medical screening examination/treatment/procedure(s) were performed by non-physician practitioner and as supervising physician I was immediately available for consultation/collaboration.   EKG Interpretation None        Osvaldo Shipper, MD 08/01/13 0045

## 2013-08-08 ENCOUNTER — Ambulatory Visit (INDEPENDENT_AMBULATORY_CARE_PROVIDER_SITE_OTHER): Payer: No Typology Code available for payment source | Admitting: Family Medicine

## 2013-08-08 ENCOUNTER — Encounter: Payer: Self-pay | Admitting: Family Medicine

## 2013-08-08 VITALS — BP 135/82 | HR 73 | Ht 60.0 in | Wt 158.0 lb

## 2013-08-08 DIAGNOSIS — S8992XD Unspecified injury of left lower leg, subsequent encounter: Secondary | ICD-10-CM

## 2013-08-08 DIAGNOSIS — M545 Low back pain, unspecified: Secondary | ICD-10-CM

## 2013-08-08 DIAGNOSIS — S8012XD Contusion of left lower leg, subsequent encounter: Secondary | ICD-10-CM

## 2013-08-08 DIAGNOSIS — S99919A Unspecified injury of unspecified ankle, initial encounter: Secondary | ICD-10-CM

## 2013-08-08 DIAGNOSIS — S8010XA Contusion of unspecified lower leg, initial encounter: Secondary | ICD-10-CM

## 2013-08-08 DIAGNOSIS — S8990XA Unspecified injury of unspecified lower leg, initial encounter: Secondary | ICD-10-CM

## 2013-08-08 DIAGNOSIS — Z5189 Encounter for other specified aftercare: Secondary | ICD-10-CM

## 2013-08-08 DIAGNOSIS — S99929A Unspecified injury of unspecified foot, initial encounter: Secondary | ICD-10-CM

## 2013-08-08 NOTE — Patient Instructions (Signed)
Take ibuprofen 600mg  three times a day regularly with food for pain and inflammation. Ice knee 15 minutes at a time 3-4 times a day. ACE wrap for compression as much as possible. Prop leg up above level of your heart as much as possible. Crutches as needed. Norco as needed for severe pain (no driving on this). Start physical therapy in 5-7 days to help regain motion and strength in this knee. Follow up with me in 4 weeks for reevaluation.

## 2013-08-12 ENCOUNTER — Encounter: Payer: Self-pay | Admitting: Family Medicine

## 2013-08-12 NOTE — Assessment & Plan Note (Signed)
2/2 lumbar strain.  Improved.  Norco, flexeril, ibuprofen, ice or heat.  Start physical therapy.  F/u in 4 weeks for both issues.

## 2013-08-12 NOTE — Assessment & Plan Note (Signed)
imaging including radiographs and CT negative.  Ultrasound showed she still has large hematoma and most of her pain and tenderness is here.  Now that she is more than a week out we discussed possibility of draining this (less likely to bleed further and have this recollect fully at this point) - she wants to go ahead with this.  Continue with icing, elevation.  ACE wrap for compression following today's aspiration.  Norco as needed.  Start physical therapy in 1 week to regain motion and strength.  After informed written consent patient was lying supine on exam table.  Left lower leg over hematoma prepped with alcohol swab.  30mL marcaine used for local anesthesia.  Then with ultrasound guidance using an 18g needle on two 60cc syringes, 97 mL of sanguinous fluid was aspirated from hematoma.  Patient tolerated procedure well without immediate complications.  Symptomatically improved following procedure.
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
## 2013-08-15 ENCOUNTER — Encounter: Payer: Self-pay | Admitting: Family Medicine

## 2013-08-15 ENCOUNTER — Ambulatory Visit (INDEPENDENT_AMBULATORY_CARE_PROVIDER_SITE_OTHER): Payer: No Typology Code available for payment source | Admitting: Family Medicine

## 2013-08-15 VITALS — BP 138/84 | HR 80 | Ht 60.0 in | Wt 157.8 lb

## 2013-08-15 DIAGNOSIS — M545 Low back pain, unspecified: Secondary | ICD-10-CM

## 2013-08-15 DIAGNOSIS — F4310 Post-traumatic stress disorder, unspecified: Secondary | ICD-10-CM

## 2013-08-15 DIAGNOSIS — S8992XD Unspecified injury of left lower leg, subsequent encounter: Secondary | ICD-10-CM

## 2013-08-15 DIAGNOSIS — Z5189 Encounter for other specified aftercare: Secondary | ICD-10-CM

## 2013-08-15 DIAGNOSIS — S99919A Unspecified injury of unspecified ankle, initial encounter: Secondary | ICD-10-CM

## 2013-08-15 DIAGNOSIS — S99929A Unspecified injury of unspecified foot, initial encounter: Secondary | ICD-10-CM

## 2013-08-15 DIAGNOSIS — S8990XA Unspecified injury of unspecified lower leg, initial encounter: Secondary | ICD-10-CM

## 2013-08-15 MED ORDER — FLUOXETINE HCL 20 MG PO CAPS: 20.0000 mg | ORAL_CAPSULE | Freq: Every day | ORAL | Status: DC

## 2013-08-15 NOTE — Patient Instructions (Signed)
Take prozac 20mg  daily. If you have any thoughts of suicide, call me immediately (this is unlikely). Take ibuprofen 600mg  three times a day regularly with food for pain and inflammation. Ice knee 15 minutes at a time 3-4 times a day. ACE wrap for compression as much as possible. Prop leg up above level of your heart as much as possible. Norco as needed for severe pain (no driving on this). Start physical therapy. Follow up with me in 4-6 weeks for reevaluation.

## 2013-08-19 ENCOUNTER — Encounter: Payer: Self-pay | Admitting: Family Medicine

## 2013-08-19 DIAGNOSIS — F4310 Post-traumatic stress disorder, unspecified: Secondary | ICD-10-CM | POA: Insufficient documentation
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
## 2013-08-19 NOTE — Assessment & Plan Note (Signed)
imaging including radiographs and CT negative.  Unfortunately most of hematoma recurred.  She can't tolerate compression yet to use ACE wrap, sleeve.  Advised I wouldn't repeat drainage of this and a cortisone injection would not help.  Continue with icing, elevation.  Norco as needed.  Start physical therapy.  MRI is a consideration if she continues to struggle though most of her pain is below the level of the knee joint.  F/u in 4-6 weeks.

## 2013-08-19 NOTE — Assessment & Plan Note (Signed)
2/2 lumbar strain.  Improved.  Norco, flexeril, ibuprofen, ice or heat.  Start physical therapy.

## 2013-08-19 NOTE — Assessment & Plan Note (Signed)
continue seeing psychologist.  Will add prozac daily.  Discussed risks of suicidal ideation - denies this.

## 2013-08-26 ENCOUNTER — Ambulatory Visit: Payer: No Typology Code available for payment source | Attending: Family Medicine | Admitting: Physical Therapy

## 2013-08-26 DIAGNOSIS — M25569 Pain in unspecified knee: Secondary | ICD-10-CM | POA: Insufficient documentation

## 2013-08-26 DIAGNOSIS — R011 Cardiac murmur, unspecified: Secondary | ICD-10-CM | POA: Insufficient documentation

## 2013-08-26 DIAGNOSIS — M545 Low back pain, unspecified: Secondary | ICD-10-CM | POA: Insufficient documentation

## 2013-08-26 DIAGNOSIS — IMO0001 Reserved for inherently not codable concepts without codable children: Secondary | ICD-10-CM | POA: Insufficient documentation

## 2013-08-26 DIAGNOSIS — F4310 Post-traumatic stress disorder, unspecified: Secondary | ICD-10-CM | POA: Insufficient documentation

## 2013-08-26 DIAGNOSIS — I10 Essential (primary) hypertension: Secondary | ICD-10-CM | POA: Insufficient documentation

## 2013-09-03 ENCOUNTER — Ambulatory Visit: Payer: No Typology Code available for payment source | Admitting: Rehabilitation

## 2013-09-03 DIAGNOSIS — M545 Low back pain, unspecified: Secondary | ICD-10-CM | POA: Diagnosis not present

## 2013-09-03 DIAGNOSIS — I10 Essential (primary) hypertension: Secondary | ICD-10-CM | POA: Diagnosis not present

## 2013-09-03 DIAGNOSIS — F4310 Post-traumatic stress disorder, unspecified: Secondary | ICD-10-CM | POA: Diagnosis not present

## 2013-09-03 DIAGNOSIS — R011 Cardiac murmur, unspecified: Secondary | ICD-10-CM | POA: Diagnosis not present

## 2013-09-03 DIAGNOSIS — M25569 Pain in unspecified knee: Secondary | ICD-10-CM | POA: Diagnosis not present

## 2013-09-03 DIAGNOSIS — IMO0001 Reserved for inherently not codable concepts without codable children: Secondary | ICD-10-CM | POA: Diagnosis not present

## 2013-09-05 ENCOUNTER — Ambulatory Visit: Payer: Self-pay | Admitting: Family Medicine

## 2013-09-09 ENCOUNTER — Ambulatory Visit: Payer: No Typology Code available for payment source | Admitting: Rehabilitation

## 2013-09-09 DIAGNOSIS — IMO0001 Reserved for inherently not codable concepts without codable children: Secondary | ICD-10-CM | POA: Diagnosis not present

## 2013-09-11 ENCOUNTER — Encounter (HOSPITAL_BASED_OUTPATIENT_CLINIC_OR_DEPARTMENT_OTHER): Payer: Self-pay | Admitting: Emergency Medicine

## 2013-09-11 ENCOUNTER — Emergency Department (HOSPITAL_BASED_OUTPATIENT_CLINIC_OR_DEPARTMENT_OTHER): Payer: No Typology Code available for payment source

## 2013-09-11 ENCOUNTER — Emergency Department (HOSPITAL_BASED_OUTPATIENT_CLINIC_OR_DEPARTMENT_OTHER)
Admission: EM | Admit: 2013-09-11 | Discharge: 2013-09-11 | Disposition: A | Discharge status: Home / Self Care | Payer: No Typology Code available for payment source | Attending: Emergency Medicine | Admitting: Emergency Medicine

## 2013-09-11 DIAGNOSIS — R112 Nausea with vomiting, unspecified: Secondary | ICD-10-CM | POA: Insufficient documentation

## 2013-09-11 DIAGNOSIS — Z79899 Other long term (current) drug therapy: Secondary | ICD-10-CM | POA: Insufficient documentation

## 2013-09-11 DIAGNOSIS — Z3202 Encounter for pregnancy test, result negative: Secondary | ICD-10-CM | POA: Insufficient documentation

## 2013-09-11 DIAGNOSIS — M940 Chondrocostal junction syndrome [Tietze]: Secondary | ICD-10-CM | POA: Insufficient documentation

## 2013-09-11 DIAGNOSIS — Z8742 Personal history of other diseases of the female genital tract: Secondary | ICD-10-CM | POA: Insufficient documentation

## 2013-09-11 DIAGNOSIS — Z87891 Personal history of nicotine dependence: Secondary | ICD-10-CM | POA: Insufficient documentation

## 2013-09-11 DIAGNOSIS — I10 Essential (primary) hypertension: Secondary | ICD-10-CM | POA: Insufficient documentation

## 2013-09-11 DIAGNOSIS — Z7982 Long term (current) use of aspirin: Secondary | ICD-10-CM | POA: Insufficient documentation

## 2013-09-11 DIAGNOSIS — R109 Unspecified abdominal pain: Secondary | ICD-10-CM | POA: Insufficient documentation

## 2013-09-11 DIAGNOSIS — K219 Gastro-esophageal reflux disease without esophagitis: Secondary | ICD-10-CM | POA: Insufficient documentation

## 2013-09-11 LAB — COMPREHENSIVE METABOLIC PANEL
ALK PHOS: 73 U/L (ref 39–117)
ALT: 8 U/L (ref 0–35)
ANION GAP: 13 (ref 5–15)
AST: 15 U/L (ref 0–37)
Albumin: 4.4 g/dL (ref 3.5–5.2)
BILIRUBIN TOTAL: 0.4 mg/dL (ref 0.3–1.2)
BUN: 13 mg/dL (ref 6–23)
CHLORIDE: 99 meq/L (ref 96–112)
CO2: 28 meq/L (ref 19–32)
CREATININE: 1 mg/dL (ref 0.50–1.10)
Calcium: 9.9 mg/dL (ref 8.4–10.5)
GFR calc Af Amer: 77 mL/min — ABNORMAL LOW (ref >90)
GFR, EST NON AFRICAN AMERICAN: 66 mL/min — AB (ref >90)
Glucose, Bld: 113 mg/dL — ABNORMAL HIGH (ref 70–99)
Potassium: 3.8 mEq/L (ref 3.7–5.3)
Sodium: 140 mEq/L (ref 137–147)
Total Protein: 8.3 g/dL (ref 6.0–8.3)

## 2013-09-11 LAB — CBC
HEMATOCRIT: 37.1 % (ref 36.0–46.0)
Hemoglobin: 11.9 g/dL — ABNORMAL LOW (ref 12.0–15.0)
MCH: 26.6 pg (ref 26.0–34.0)
MCHC: 32.1 g/dL (ref 30.0–36.0)
MCV: 82.8 fL (ref 78.0–100.0)
Platelets: 376 10*3/uL (ref 150–400)
RBC: 4.48 MIL/uL (ref 3.87–5.11)
RDW: 15 % (ref 11.5–15.5)
WBC: 7.1 10*3/uL (ref 4.0–10.5)

## 2013-09-11 LAB — PREGNANCY, URINE: Preg Test, Ur: NEGATIVE

## 2013-09-11 LAB — LIPASE, BLOOD: LIPASE: 39 U/L (ref 11–59)

## 2013-09-11 LAB — I-STAT CG4 LACTIC ACID, ED: Lactic Acid, Venous: 0.95 mmol/L (ref 0.5–2.2)

## 2013-09-11 MED ORDER — SODIUM CHLORIDE 0.9 % IV BOLUS (SEPSIS)
1000.0000 mL | Freq: Once | INTRAVENOUS | Status: AC
  Administered 2013-09-11: 1000 mL via INTRAVENOUS

## 2013-09-11 MED ORDER — IOHEXOL 300 MG/ML  SOLN
50.0000 mL | Freq: Once | INTRAMUSCULAR | Status: AC | PRN
  Administered 2013-09-11: 50 mL via ORAL

## 2013-09-11 MED ORDER — IOHEXOL 300 MG/ML  SOLN
100.0000 mL | Freq: Once | INTRAMUSCULAR | Status: AC | PRN
  Administered 2013-09-11: 100 mL via INTRAVENOUS

## 2013-09-11 MED ORDER — ONDANSETRON HCL 4 MG/2ML IJ SOLN
4.0000 mg | Freq: Once | INTRAMUSCULAR | Status: AC
  Administered 2013-09-11: 4 mg via INTRAVENOUS
  Filled 2013-09-11: qty 2

## 2013-09-11 MED ORDER — ONDANSETRON 4 MG PO TBDP: 4.0000 mg | ORAL_TABLET | Freq: Three times a day (TID) | ORAL | Status: DC | PRN

## 2013-09-11 MED ORDER — HYDROMORPHONE HCL PF 1 MG/ML IJ SOLN
1.0000 mg | Freq: Once | INTRAMUSCULAR | Status: AC
Start: 2013-09-11 — End: 2013-09-11
  Administered 2013-09-11: 1 mg via INTRAVENOUS
  Filled 2013-09-11: qty 1

## 2013-09-11 NOTE — ED Notes (Signed)
Pt c/o vomiting x 2 days with Cp that started x 4 hrs ago

## 2013-09-11 NOTE — ED Notes (Signed)
Patient transported to CT 

## 2013-09-11 NOTE — ED Notes (Signed)
MD at bedside. 

## 2013-09-11 NOTE — Discharge Instructions (Signed)

## 2013-09-11 NOTE — ED Provider Notes (Signed)
CSN: [id number]     Arrival date & time 09/11/13  1823 History   First MD Initiated Contact with Patient 09/11/13 1832     Chief Complaint  Patient presents with  . Emesis     (Consider location/radiation/quality/duration/timing/severity/associated sxs/prior Treatment) Patient is a 48 y.o. female presenting with vomiting.  Emesis Severity:  Moderate Duration:  4 hours Timing:  Constant Quality:  Stomach contents Able to tolerate:  Liquids and solids Progression:  Worsening Chronicity:  Recurrent Recent urination:  Normal Context: not post-tussive and not self-induced   Relieved by:  None tried Worsened by:  Nothing tried Ineffective treatments:  None tried Associated symptoms: abdominal pain   Associated symptoms: no chills, no diarrhea, no fever and no headaches     Past Medical History  Diagnosis Date  . Headache(784.0) 2011    Migraines  . Esophageal reflux     On omeprazole  . Costochondritis   . Hypertension     started around 2011  . Ovarian cyst    Past Surgical History  Procedure Laterality Date  . Oophorectomy      Around 1985 left ovary removal  . Tonsillectomy      Around 1994  . Tubal ligation     Family History  Problem Relation Age of Onset  . Hypertension Mother   . Diabetes Mellitus II Mother   . Hypertension Sister   . Hypertension Brother   . Heart failure Maternal Grandmother   . Hypertension Maternal Grandfather    History  Substance Use Topics  . Smoking status: Former Research scientist (life sciences)  . Smokeless tobacco: Never Used     Comment: Stopped 2004  . Alcohol Use: Yes     Comment: occ   OB History   Grav Para Term Preterm Abortions TAB SAB Ect Mult Living                 Review of Systems  Constitutional: Negative for fever and chills.  Cardiovascular: Positive for chest pain.  Gastrointestinal: Positive for vomiting and abdominal pain. Negative for diarrhea and constipation.  Neurological: Negative for syncope, light-headedness and  headaches.  All other systems reviewed and are negative.     Allergies  Review of patient's allergies indicates no known allergies.  Home Medications   Prior to Admission medications   Medication Sig Start Date End Date Taking? Authorizing Provider  aspirin 81 MG tablet Take 81 mg by mouth daily.    Historical Provider, MD  carvedilol (COREG) 6.25 MG tablet Take 6.25 mg by mouth 2 (two) times daily with a meal.    Historical Provider, MD  cephALEXin (KEFLEX) 500 MG capsule  07/19/13   Historical Provider, MD  cyclobenzaprine (FLEXERIL) 10 MG tablet Take 1 tablet (10 mg total) by mouth every 8 (eight) hours as needed for muscle spasms. 07/25/13   Dene Gentry, MD  FLUoxetine (PROZAC) 20 MG capsule Take 1 capsule (20 mg total) by mouth daily. 08/15/13   Dene Gentry, MD  HYDROcodone-acetaminophen (NORCO) 5-325 MG per tablet Take 1 tablet by mouth every 6 (six) hours as needed. 07/25/13   Dene Gentry, MD  ibuprofen (ADVIL,MOTRIN) 600 MG tablet Take 1 tablet (600 mg total) by mouth every 8 (eight) hours as needed. 07/25/13   Dene Gentry, MD  omeprazole (PRILOSEC) 40 MG capsule Take 40 mg by mouth daily.    Historical Provider, MD  ondansetron (ZOFRAN ODT) 4 MG disintegrating tablet Take 1 tablet (4 mg total) by mouth every 8 (  eight) hours as needed for nausea or vomiting. 09/11/13   Cordelia Poche, MD  SUMAtriptan Succinate (SUMAVEL DOSEPRO) 6 MG/0.5ML DEVI Inject into the skin.    Historical Provider, MD   BP 134/76  Pulse 76  Temp(Src) 98.9 F (37.2 C) (Oral)  Resp 16  Ht 5' (1.524 m)  Wt 150 lb (68.04 kg)  BMI 29.30 kg/m2  SpO2 99%  LMP 09/10/2013  Physical Exam  Constitutional: She is oriented to person, place, and time. She appears well-developed and well-nourished.  HENT:  Head: Normocephalic and atraumatic.  Eyes: Conjunctivae are normal. Pupils are equal, round, and reactive to light.  Cardiovascular: Normal rate, regular rhythm and normal pulses.   No murmur  heard. Pulmonary/Chest: Effort normal. She has no decreased breath sounds. She has no wheezes.  Abdominal: Soft. Normal appearance and bowel sounds are normal. There is generalized tenderness. There is no rigidity, no rebound, no guarding and no CVA tenderness.  Neurological: She is alert and oriented to person, place, and time.  Skin: Skin is warm and dry.    ED Course  Procedures (including critical care time) Labs Review Labs Reviewed  CBC - Abnormal; Notable for the following:    Hemoglobin 11.9 (*)    All other components within normal limits  COMPREHENSIVE METABOLIC PANEL - Abnormal; Notable for the following:    Glucose, Bld 113 (*)    GFR calc non Af Amer 66 (*)    GFR calc Af Amer 77 (*)    All other components within normal limits  PREGNANCY, URINE  LIPASE, BLOOD  I-STAT CG4 LACTIC ACID, ED    Imaging Review Ct Abdomen Pelvis W Contrast  09/11/2013   CLINICAL DATA:  Nausea and vomiting.  Abdominal pain.  EXAM: CT ABDOMEN AND PELVIS WITH CONTRAST  TECHNIQUE: Multidetector CT imaging of the abdomen and pelvis was performed using the standard protocol following bolus administration of intravenous contrast.  CONTRAST:  50mL OMNIPAQUE IOHEXOL 300 MG/ML SOLN, 164mL OMNIPAQUE IOHEXOL 300 MG/ML SOLN  COMPARISON:  None.  FINDINGS: Bones: No aggressive osseous lesions. Lumbosacral transitional vertebra.  Lung Bases: Scarring is present in the RIGHT lower lobe extending to the lateral pleural surface. This extends to the RIGHT middle lobe as well. No pulmonary mass lesion.  Liver:  Normal.  Spleen:  Normal.  Gallbladder:  Normal.  No calcified gallstones.  Common bile duct:  Normal.  Pancreas:  Normal.  Adrenal glands:  Normal bilaterally.  Kidneys: Normal enhancement. There is early excretion of contrast from both kidneys. Tiny subcentimeter RIGHT interpolar low-density renal lesion likely represents a cyst. Both ureters appear within normal limits. Normal delayed excretion of contrast.   Stomach:  Normal.  Small bowel: Normal. No mesenteric adenopathy. No bowel obstruction.  Colon: Metallic density is present throughout the colon, compatible with ingested bismuth salts. No colonic obstruction or inflammatory changes.  Pelvic Genitourinary: Uterus appears within normal limits. Ovaries normal. Bladder collapsed.  Vasculature: Normal.  Body Wall: Normal.  IMPRESSION: No acute abnormality. RIGHT middle and RIGHT lower lobe pulmonary parenchymal scarring.   Electronically Signed   By: Dereck Ligas M.D.   On: 09/11/2013 20:53     EKG Interpretation   Date/Time:  Wednesday September 11 2013 21:24:03 EDT Ventricular Rate:  69 PR Interval:  130 QRS Duration: 84 QT Interval:  386 QTC Calculation: 413 R Axis:   40 Text Interpretation:  Normal sinus rhythm Minimal voltage criteria for  LVH, may be normal variant Borderline ECG No prior Confirmed by  Mingo Amber   MD, Benjie Karvonen [phone]) on 09/11/2013 9:40:56 PM      MDM   Final diagnoses:  Nausea and vomiting, vomiting of unspecified type  Costochondritis   Patient's nausea and vomiting improved with zofran IV. Patient given fluids and dilaudid for abdominal and chest pain. EKG with NSR. Chest pain reproducible and patient has history of costochondritis. Do not suspect ACS. Abdominal CT unremarkable for intraabdominal process. Patient okay to go home with zofran. Patient understands and agrees with plan. Stable for discharge home.  Cordelia Poche, MD 09/12/13 [phone]

## 2013-09-12 ENCOUNTER — Emergency Department (HOSPITAL_BASED_OUTPATIENT_CLINIC_OR_DEPARTMENT_OTHER)
Admission: EM | Admit: 2013-09-12 | Discharge: 2013-09-12 | Disposition: A | Discharge status: Home / Self Care | Payer: No Typology Code available for payment source | Attending: Emergency Medicine | Admitting: Emergency Medicine

## 2013-09-12 ENCOUNTER — Encounter (HOSPITAL_BASED_OUTPATIENT_CLINIC_OR_DEPARTMENT_OTHER): Payer: Self-pay | Admitting: Emergency Medicine

## 2013-09-12 ENCOUNTER — Emergency Department (HOSPITAL_BASED_OUTPATIENT_CLINIC_OR_DEPARTMENT_OTHER): Payer: No Typology Code available for payment source

## 2013-09-12 ENCOUNTER — Ambulatory Visit: Payer: No Typology Code available for payment source | Admitting: Rehabilitation

## 2013-09-12 DIAGNOSIS — K219 Gastro-esophageal reflux disease without esophagitis: Secondary | ICD-10-CM | POA: Insufficient documentation

## 2013-09-12 DIAGNOSIS — R197 Diarrhea, unspecified: Secondary | ICD-10-CM | POA: Insufficient documentation

## 2013-09-12 DIAGNOSIS — R0789 Other chest pain: Secondary | ICD-10-CM | POA: Insufficient documentation

## 2013-09-12 DIAGNOSIS — Z7982 Long term (current) use of aspirin: Secondary | ICD-10-CM | POA: Insufficient documentation

## 2013-09-12 DIAGNOSIS — I10 Essential (primary) hypertension: Secondary | ICD-10-CM | POA: Insufficient documentation

## 2013-09-12 DIAGNOSIS — R1084 Generalized abdominal pain: Secondary | ICD-10-CM | POA: Insufficient documentation

## 2013-09-12 DIAGNOSIS — Z87891 Personal history of nicotine dependence: Secondary | ICD-10-CM | POA: Insufficient documentation

## 2013-09-12 DIAGNOSIS — R112 Nausea with vomiting, unspecified: Secondary | ICD-10-CM | POA: Insufficient documentation

## 2013-09-12 DIAGNOSIS — Z79899 Other long term (current) drug therapy: Secondary | ICD-10-CM | POA: Insufficient documentation

## 2013-09-12 DIAGNOSIS — Z8742 Personal history of other diseases of the female genital tract: Secondary | ICD-10-CM | POA: Insufficient documentation

## 2013-09-12 LAB — URINALYSIS, ROUTINE W REFLEX MICROSCOPIC
Bilirubin Urine: NEGATIVE
Glucose, UA: NEGATIVE mg/dL
Ketones, ur: 40 mg/dL — AB
LEUKOCYTES UA: NEGATIVE
Nitrite: NEGATIVE
PROTEIN: 30 mg/dL — AB
Specific Gravity, Urine: 1.024 (ref 1.005–1.030)
UROBILINOGEN UA: 0.2 mg/dL (ref 0.0–1.0)
pH: 7 (ref 5.0–8.0)

## 2013-09-12 LAB — CBC WITH DIFFERENTIAL/PLATELET
Basophils Absolute: 0 10*3/uL (ref 0.0–0.1)
Basophils Relative: 0 % (ref 0–1)
EOS ABS: 0 10*3/uL (ref 0.0–0.7)
EOS PCT: 0 % (ref 0–5)
HCT: 34.7 % — ABNORMAL LOW (ref 36.0–46.0)
Hemoglobin: 11.1 g/dL — ABNORMAL LOW (ref 12.0–15.0)
LYMPHS ABS: 1.5 10*3/uL (ref 0.7–4.0)
Lymphocytes Relative: 22 % (ref 12–46)
MCH: 26.6 pg (ref 26.0–34.0)
MCHC: 32 g/dL (ref 30.0–36.0)
MCV: 83.2 fL (ref 78.0–100.0)
Monocytes Absolute: 0.4 10*3/uL (ref 0.1–1.0)
Monocytes Relative: 6 % (ref 3–12)
Neutro Abs: 4.9 10*3/uL (ref 1.7–7.7)
Neutrophils Relative %: 71 % (ref 43–77)
PLATELETS: 370 10*3/uL (ref 150–400)
RBC: 4.17 MIL/uL (ref 3.87–5.11)
RDW: 15 % (ref 11.5–15.5)
WBC: 6.9 10*3/uL (ref 4.0–10.5)

## 2013-09-12 LAB — COMPREHENSIVE METABOLIC PANEL
ALT: 9 U/L (ref 0–35)
ANION GAP: 13 (ref 5–15)
AST: 15 U/L (ref 0–37)
Albumin: 4 g/dL (ref 3.5–5.2)
Alkaline Phosphatase: 63 U/L (ref 39–117)
BUN: 10 mg/dL (ref 6–23)
CO2: 27 mEq/L (ref 19–32)
Calcium: 9.1 mg/dL (ref 8.4–10.5)
Chloride: 101 mEq/L (ref 96–112)
Creatinine, Ser: 1 mg/dL (ref 0.50–1.10)
GFR calc non Af Amer: 66 mL/min — ABNORMAL LOW (ref >90)
GFR, EST AFRICAN AMERICAN: 77 mL/min — AB (ref >90)
GLUCOSE: 131 mg/dL — AB (ref 70–99)
Potassium: 3.9 mEq/L (ref 3.7–5.3)
SODIUM: 141 meq/L (ref 137–147)
TOTAL PROTEIN: 7.4 g/dL (ref 6.0–8.3)
Total Bilirubin: 0.4 mg/dL (ref 0.3–1.2)

## 2013-09-12 LAB — URINE MICROSCOPIC-ADD ON

## 2013-09-12 LAB — TROPONIN I: Troponin I: <0.3 ng/mL (ref <0.30)

## 2013-09-12 MED ORDER — HYDROMORPHONE HCL PF 1 MG/ML IJ SOLN
1.0000 mg | Freq: Once | INTRAMUSCULAR | Status: AC
  Administered 2013-09-12: 1 mg via INTRAVENOUS
  Filled 2013-09-12: qty 1

## 2013-09-12 MED ORDER — ONDANSETRON HCL 4 MG/2ML IJ SOLN
4.0000 mg | Freq: Once | INTRAMUSCULAR | Status: AC
  Administered 2013-09-12: 4 mg via INTRAVENOUS
  Filled 2013-09-12: qty 2

## 2013-09-12 MED ORDER — SODIUM CHLORIDE 0.9 % IV BOLUS (SEPSIS)
1000.0000 mL | INTRAVENOUS | Status: AC
  Administered 2013-09-12: 1000 mL via INTRAVENOUS

## 2013-09-12 MED ORDER — CEPHALEXIN 500 MG PO CAPS: 500.0000 mg | ORAL_CAPSULE | Freq: Four times a day (QID) | ORAL | Status: DC

## 2013-09-12 MED ORDER — OXYCODONE-ACETAMINOPHEN 5-325 MG PO TABS: 1.0000 | ORAL_TABLET | Freq: Four times a day (QID) | ORAL | Status: DC | PRN

## 2013-09-12 NOTE — Discharge Instructions (Signed)
Abdominal Pain, Women °Abdominal (stomach, pelvic, or belly) pain can be caused by many things. It is important to tell your doctor: °· The location of the pain. °· Does it come and go or is it present all the time? °· Are there things that start the pain (eating certain foods, exercise)? °· Are there other symptoms associated with the pain (fever, nausea, vomiting, diarrhea)? °All of this is helpful to know when trying to find the cause of the pain. °CAUSES  °· Stomach: virus or bacteria infection, or ulcer. °· Intestine: appendicitis (inflamed appendix), regional ileitis (Crohn's disease), ulcerative colitis (inflamed colon), irritable bowel syndrome, diverticulitis (inflamed diverticulum of the colon), or cancer of the stomach or intestine. °· Gallbladder disease or stones in the gallbladder. °· Kidney disease, kidney stones, or infection. °· Pancreas infection or cancer. °· Fibromyalgia (pain disorder). °· Diseases of the female organs: °¨ Uterus: fibroid (non-cancerous) tumors or infection. °¨ Fallopian tubes: infection or tubal pregnancy. °¨ Ovary: cysts or tumors. °¨ Pelvic adhesions (scar tissue). °¨ Endometriosis (uterus lining tissue growing in the pelvis and on the pelvic organs). °¨ Pelvic congestion syndrome (female organs filling up with blood just before the menstrual period). °¨ Pain with the menstrual period. °¨ Pain with ovulation (producing an egg). °¨ Pain with an IUD (intrauterine device, birth control) in the uterus. °¨ Cancer of the female organs. °· Functional pain (pain not caused by a disease, may improve without treatment). °· Psychological pain. °· Depression. °DIAGNOSIS  °Your doctor will decide the seriousness of your pain by doing an examination. °· Blood tests. °· X-rays. °· Ultrasound. °· CT scan (computed tomography, special type of X-ray). °· MRI (magnetic resonance imaging). °· Cultures, for infection. °· Barium enema (dye inserted in the large intestine, to better view it with  X-rays). °· Colonoscopy (looking in intestine with a lighted tube). °· Laparoscopy (minor surgery, looking in abdomen with a lighted tube). °· Major abdominal exploratory surgery (looking in abdomen with a large incision). °TREATMENT  °The treatment will depend on the cause of the pain.  °· Many cases can be observed and treated at home. °· Over-the-counter medicines recommended by your caregiver. °· Prescription medicine. °· Antibiotics, for infection. °· Birth control pills, for painful periods or for ovulation pain. °· Hormone treatment, for endometriosis. °· Nerve blocking injections. °· Physical therapy. °· Antidepressants. °· Counseling with a psychologist or psychiatrist. °· Minor or major surgery. °HOME CARE INSTRUCTIONS  °· Do not take laxatives, unless directed by your caregiver. °· Take over-the-counter pain medicine only if ordered by your caregiver. Do not take aspirin because it can cause an upset stomach or bleeding. °· Try a clear liquid diet (broth or water) as ordered by your caregiver. Slowly move to a bland diet, as tolerated, if the pain is related to the stomach or intestine. °· Have a thermometer and take your temperature several times a day, and record it. °· Bed rest and sleep, if it helps the pain. °· Avoid sexual intercourse, if it causes pain. °· Avoid stressful situations. °· Keep your follow-up appointments and tests, as your caregiver orders. °· If the pain does not go away with medicine or surgery, you may try: °¨ Acupuncture. °¨ Relaxation exercises (yoga, meditation). °¨ Group therapy. °¨ Counseling. °SEEK MEDICAL CARE IF:  °· You notice certain foods cause stomach pain. °· Your home care treatment is not helping your pain. °· You need stronger pain medicine. °· You want your IUD removed. °· You feel faint or   lightheaded. °· You develop nausea and vomiting. °· You develop a rash. °· You are having side effects or an allergy to your medicine. °SEEK IMMEDIATE MEDICAL CARE IF:  °· Your  pain does not go away or gets worse. °· You have a fever. °· Your pain is felt only in portions of the abdomen. The right side could possibly be appendicitis. The left lower portion of the abdomen could be colitis or diverticulitis. °· You are passing blood in your stools (bright red or black tarry stools, with or without vomiting). °· You have blood in your urine. °· You develop chills, with or without a fever. °· You pass out. °MAKE SURE YOU:  °· Understand these instructions. °· Will watch your condition. °· Will get help right away if you are not doing well or get worse. °Document Released: 12/05/2006 Document Revised: 06/24/2013 Document Reviewed: 12/25/2008 °[employer]® Patient Information ©2015 [employer]. This information is not intended to replace advice given to you by your health care provider. Make sure you discuss any questions you have with your health care provider. ° °

## 2013-09-12 NOTE — ED Provider Notes (Signed)
CSN: [id number]     Arrival date & time 09/12/13  1416 History   First MD Initiated Contact with Patient 09/12/13 1451     Chief Complaint  Patient presents with  . Abdominal Pain     (Consider location/radiation/quality/duration/timing/severity/associated sxs/prior Treatment) Patient is a 48 y.o. female presenting with abdominal pain. The history is provided by the patient.  Abdominal Pain Pain location:  Generalized Pain quality: aching   Pain radiates to:  Does not radiate Pain severity:  Moderate Onset quality:  Gradual Duration:  2 days Timing:  Constant Progression:  Worsening Chronicity:  New Context comment:  After eating doritos last night the N/V came back Relieved by:  Nothing Worsened by:  Nothing tried Ineffective treatments: zofran at home. Associated symptoms: diarrhea, nausea and vomiting   Associated symptoms: no chest pain, no cough, no dysuria, no fatigue, no fever, no hematuria and no shortness of breath     Past Medical History  Diagnosis Date  . Headache(784.0) 2011    Migraines  . Esophageal reflux     On omeprazole  . Costochondritis   . Hypertension     started around 2011  . Ovarian cyst    Past Surgical History  Procedure Laterality Date  . Oophorectomy      Around 1985 left ovary removal  . Tonsillectomy      Around 1994  . Tubal ligation     Family History  Problem Relation Age of Onset  . Hypertension Mother   . Diabetes Mellitus II Mother   . Hypertension Sister   . Hypertension Brother   . Heart failure Maternal Grandmother   . Hypertension Maternal Grandfather    History  Substance Use Topics  . Smoking status: Former Research scientist (life sciences)  . Smokeless tobacco: Never Used     Comment: Stopped 2004  . Alcohol Use: Yes     Comment: occ   OB History   Grav Para Term Preterm Abortions TAB SAB Ect Mult Living                 Review of Systems  Constitutional: Negative for fever and fatigue.  HENT: Negative for congestion and  drooling.   Eyes: Negative for pain.  Respiratory: Positive for chest tightness. Negative for cough and shortness of breath.   Cardiovascular: Negative for chest pain.  Gastrointestinal: Positive for nausea, vomiting, abdominal pain and diarrhea.  Genitourinary: Negative for dysuria and hematuria.  Musculoskeletal: Negative for back pain, gait problem and neck pain.  Skin: Negative for color change.  Neurological: Negative for dizziness and headaches.  Hematological: Negative for adenopathy.  Psychiatric/Behavioral: Negative for behavioral problems.  All other systems reviewed and are negative.     Allergies  Review of patient's allergies indicates no known allergies.  Home Medications   Prior to Admission medications   Medication Sig Start Date End Date Taking? Authorizing Provider  aspirin 81 MG tablet Take 81 mg by mouth daily.    Historical Provider, MD  carvedilol (COREG) 6.25 MG tablet Take 6.25 mg by mouth 2 (two) times daily with a meal.    Historical Provider, MD  cephALEXin (KEFLEX) 500 MG capsule  07/19/13   Historical Provider, MD  cyclobenzaprine (FLEXERIL) 10 MG tablet Take 1 tablet (10 mg total) by mouth every 8 (eight) hours as needed for muscle spasms. 07/25/13   Dene Gentry, MD  FLUoxetine (PROZAC) 20 MG capsule Take 1 capsule (20 mg total) by mouth daily. 08/15/13   Dene Gentry, MD  HYDROcodone-acetaminophen (NORCO) 5-325 MG per tablet Take 1 tablet by mouth every 6 (six) hours as needed. 07/25/13   Dene Gentry, MD  ibuprofen (ADVIL,MOTRIN) 600 MG tablet Take 1 tablet (600 mg total) by mouth every 8 (eight) hours as needed. 07/25/13   Dene Gentry, MD  omeprazole (PRILOSEC) 40 MG capsule Take 40 mg by mouth daily.    Historical Provider, MD  ondansetron (ZOFRAN ODT) 4 MG disintegrating tablet Take 1 tablet (4 mg total) by mouth every 8 (eight) hours as needed for nausea or vomiting. 09/11/13   Cordelia Poche, MD  SUMAtriptan Succinate (SUMAVEL DOSEPRO) 6  MG/0.5ML DEVI Inject into the skin.    Historical Provider, MD   BP 176/108  Pulse 94  Temp(Src) 98.3 F (36.8 C) (Oral)  Resp 22  SpO2 100%  LMP 09/10/2013 Physical Exam  Nursing note and vitals reviewed. Constitutional: She is oriented to person, place, and time. She appears well-developed and well-nourished.  HENT:  Head: Normocephalic.  Mouth/Throat: Oropharynx is clear and moist. No oropharyngeal exudate.  Eyes: Conjunctivae and EOM are normal. Pupils are equal, round, and reactive to light.  Neck: Normal range of motion. Neck supple.  Cardiovascular: Normal rate, regular rhythm, normal heart sounds and intact distal pulses.  Exam reveals no gallop and no friction rub.   No murmur heard. Pulmonary/Chest: Effort normal and breath sounds normal. No respiratory distress. She has no wheezes. She exhibits tenderness (Anterior chest wall pain with palpation bilaterally.).  Abdominal: Soft. Bowel sounds are normal. There is tenderness (nonspecific diffuse tenderness to palpation of the abdomen.). There is no rebound and no guarding.  Musculoskeletal: Normal range of motion. She exhibits no edema and no tenderness.  Neurological: She is alert and oriented to person, place, and time.  Skin: Skin is warm and dry.  Psychiatric: She has a normal mood and affect. Her behavior is normal.    ED Course  Procedures (including critical care time) Labs Review Labs Reviewed  CBC WITH DIFFERENTIAL  COMPREHENSIVE METABOLIC PANEL  TROPONIN I  URINALYSIS, ROUTINE W REFLEX MICROSCOPIC    Imaging Review Ct Abdomen Pelvis W Contrast  09/11/2013   CLINICAL DATA:  Nausea and vomiting.  Abdominal pain.  EXAM: CT ABDOMEN AND PELVIS WITH CONTRAST  TECHNIQUE: Multidetector CT imaging of the abdomen and pelvis was performed using the standard protocol following bolus administration of intravenous contrast.  CONTRAST:  95mL OMNIPAQUE IOHEXOL 300 MG/ML SOLN, 165mL OMNIPAQUE IOHEXOL 300 MG/ML SOLN   COMPARISON:  None.  FINDINGS: Bones: No aggressive osseous lesions. Lumbosacral transitional vertebra.  Lung Bases: Scarring is present in the RIGHT lower lobe extending to the lateral pleural surface. This extends to the RIGHT middle lobe as well. No pulmonary mass lesion.  Liver:  Normal.  Spleen:  Normal.  Gallbladder:  Normal.  No calcified gallstones.  Common bile duct:  Normal.  Pancreas:  Normal.  Adrenal glands:  Normal bilaterally.  Kidneys: Normal enhancement. There is early excretion of contrast from both kidneys. Tiny subcentimeter RIGHT interpolar low-density renal lesion likely represents a cyst. Both ureters appear within normal limits. Normal delayed excretion of contrast.  Stomach:  Normal.  Small bowel: Normal. No mesenteric adenopathy. No bowel obstruction.  Colon: Metallic density is present throughout the colon, compatible with ingested bismuth salts. No colonic obstruction or inflammatory changes.  Pelvic Genitourinary: Uterus appears within normal limits. Ovaries normal. Bladder collapsed.  Vasculature: Normal.  Body Wall: Normal.  IMPRESSION: No acute abnormality. RIGHT middle and RIGHT lower lobe  pulmonary parenchymal scarring.   Electronically Signed   By: Dereck Ligas M.D.   On: 09/11/2013 20:53     EKG Interpretation None      MDM   Final diagnoses:  Nausea and vomiting, vomiting of unspecified type  Abdominal pain, generalized  Other chest pain    3:04 PM 48 y.o. female who presents with nausea, vomiting, abdominal pain, and chest pain. She states that her symptoms started 2 days ago. She states that she has had intermittent chest tightness lasting for minutes at a time and relieved by emesis since then. She was seen here yesterday and had noncontributory labwork, negative upreg, noncontributory CT of the abdomen and pelvis. She states that she ate some degree of his last night and her symptoms returned several hours later in the early morning. She now notes ongoing  nausea, vomiting, abdominal/chest pain. Will get screening labwork and imaging. Will treat symptomatically.  6:41 PM: Pt feeling better, tolerating po. Bact in UA, doubt UTI as she has no urinary sx, but given vomiting and abd discomfort w/ cover w/ keflex. Will send urine culture. Likely a viral syndrome. Pt is low risk for MACE per HEART score, cp likely related to vomiting. I have discussed the diagnosis/risks/treatment options with the patient and believe the pt to be eligible for discharge home to follow-up with pcp as needed. We also discussed returning to the ED immediately if new or worsening sx occur. We discussed the sx which are most concerning (e.g., worsening pain, not tolerating po) that necessitate immediate return. Medications administered to the patient during their visit and any new prescriptions provided to the patient are listed below.  Medications given during this visit Medications  sodium chloride 0.9 % bolus 1,000 mL (1,000 mLs Intravenous New Bag/Given 09/12/13 1531)  ondansetron (ZOFRAN) injection 4 mg (4 mg Intravenous Given 09/12/13 1531)  HYDROmorphone (DILAUDID) injection 1 mg (1 mg Intravenous Given 09/12/13 1531)  HYDROmorphone (DILAUDID) injection 1 mg (1 mg Intravenous Given 09/12/13 1823)  ondansetron (ZOFRAN) injection 4 mg (4 mg Intravenous Given 09/12/13 1823)    New Prescriptions   CEPHALEXIN (KEFLEX) 500 MG CAPSULE    Take 1 capsule (500 mg total) by mouth 4 (four) times daily.   OXYCODONE-ACETAMINOPHEN (PERCOCET) 5-325 MG PER TABLET    Take 1 tablet by mouth every 6 (six) hours as needed for moderate pain.      Blanchard Kelch, MD 09/12/13 [phone]

## 2013-09-12 NOTE — ED Notes (Signed)
Pt c/o abd pain, CP, vomiting-seen last night for same-states she has taken zofran w/o relief

## 2013-09-13 ENCOUNTER — Emergency Department (HOSPITAL_BASED_OUTPATIENT_CLINIC_OR_DEPARTMENT_OTHER)
Admission: EM | Admit: 2013-09-13 | Discharge: 2013-09-13 | Disposition: A | Discharge status: Home / Self Care | Payer: No Typology Code available for payment source | Attending: Emergency Medicine | Admitting: Emergency Medicine

## 2013-09-13 ENCOUNTER — Encounter (HOSPITAL_BASED_OUTPATIENT_CLINIC_OR_DEPARTMENT_OTHER): Payer: Self-pay | Admitting: Emergency Medicine

## 2013-09-13 ENCOUNTER — Emergency Department (HOSPITAL_BASED_OUTPATIENT_CLINIC_OR_DEPARTMENT_OTHER): Payer: No Typology Code available for payment source

## 2013-09-13 DIAGNOSIS — Z9851 Tubal ligation status: Secondary | ICD-10-CM | POA: Insufficient documentation

## 2013-09-13 DIAGNOSIS — R1084 Generalized abdominal pain: Secondary | ICD-10-CM | POA: Insufficient documentation

## 2013-09-13 DIAGNOSIS — I10 Essential (primary) hypertension: Secondary | ICD-10-CM | POA: Insufficient documentation

## 2013-09-13 DIAGNOSIS — Z8742 Personal history of other diseases of the female genital tract: Secondary | ICD-10-CM | POA: Insufficient documentation

## 2013-09-13 DIAGNOSIS — Z792 Long term (current) use of antibiotics: Secondary | ICD-10-CM | POA: Insufficient documentation

## 2013-09-13 DIAGNOSIS — Z7982 Long term (current) use of aspirin: Secondary | ICD-10-CM | POA: Insufficient documentation

## 2013-09-13 DIAGNOSIS — Z8739 Personal history of other diseases of the musculoskeletal system and connective tissue: Secondary | ICD-10-CM | POA: Insufficient documentation

## 2013-09-13 DIAGNOSIS — R1110 Vomiting, unspecified: Secondary | ICD-10-CM

## 2013-09-13 DIAGNOSIS — J189 Pneumonia, unspecified organism: Secondary | ICD-10-CM

## 2013-09-13 DIAGNOSIS — Z87891 Personal history of nicotine dependence: Secondary | ICD-10-CM | POA: Insufficient documentation

## 2013-09-13 DIAGNOSIS — Z79899 Other long term (current) drug therapy: Secondary | ICD-10-CM | POA: Insufficient documentation

## 2013-09-13 DIAGNOSIS — M546 Pain in thoracic spine: Secondary | ICD-10-CM | POA: Insufficient documentation

## 2013-09-13 DIAGNOSIS — R1013 Epigastric pain: Secondary | ICD-10-CM | POA: Insufficient documentation

## 2013-09-13 DIAGNOSIS — J159 Unspecified bacterial pneumonia: Secondary | ICD-10-CM | POA: Insufficient documentation

## 2013-09-13 DIAGNOSIS — R112 Nausea with vomiting, unspecified: Secondary | ICD-10-CM | POA: Insufficient documentation

## 2013-09-13 DIAGNOSIS — K219 Gastro-esophageal reflux disease without esophagitis: Secondary | ICD-10-CM | POA: Insufficient documentation

## 2013-09-13 DIAGNOSIS — Z9889 Other specified postprocedural states: Secondary | ICD-10-CM | POA: Insufficient documentation

## 2013-09-13 DIAGNOSIS — R072 Precordial pain: Secondary | ICD-10-CM | POA: Insufficient documentation

## 2013-09-13 DIAGNOSIS — R071 Chest pain on breathing: Secondary | ICD-10-CM | POA: Insufficient documentation

## 2013-09-13 LAB — COMPREHENSIVE METABOLIC PANEL
ALT: 10 U/L (ref 0–35)
AST: 18 U/L (ref 0–37)
Albumin: 4.5 g/dL (ref 3.5–5.2)
Alkaline Phosphatase: 68 U/L (ref 39–117)
Anion gap: 17 — ABNORMAL HIGH (ref 5–15)
BILIRUBIN TOTAL: 0.5 mg/dL (ref 0.3–1.2)
BUN: 9 mg/dL (ref 6–23)
CO2: 24 meq/L (ref 19–32)
CREATININE: 0.9 mg/dL (ref 0.50–1.10)
Calcium: 9.8 mg/dL (ref 8.4–10.5)
Chloride: 99 mEq/L (ref 96–112)
GFR calc Af Amer: 88 mL/min — ABNORMAL LOW (ref >90)
GFR calc non Af Amer: 76 mL/min — ABNORMAL LOW (ref >90)
Glucose, Bld: 140 mg/dL — ABNORMAL HIGH (ref 70–99)
Potassium: 3.8 mEq/L (ref 3.7–5.3)
Sodium: 140 mEq/L (ref 137–147)
Total Protein: 8.3 g/dL (ref 6.0–8.3)

## 2013-09-13 LAB — URINE CULTURE
CULTURE: NO GROWTH
Colony Count: NO GROWTH

## 2013-09-13 LAB — CBC WITH DIFFERENTIAL/PLATELET
BASOS ABS: 0 10*3/uL (ref 0.0–0.1)
BASOS PCT: 0 % (ref 0–1)
EOS PCT: 0 % (ref 0–5)
Eosinophils Absolute: 0 10*3/uL (ref 0.0–0.7)
HCT: 34.9 % — ABNORMAL LOW (ref 36.0–46.0)
Hemoglobin: 11.2 g/dL — ABNORMAL LOW (ref 12.0–15.0)
Lymphocytes Relative: 13 % (ref 12–46)
Lymphs Abs: 1 10*3/uL (ref 0.7–4.0)
MCH: 26.3 pg (ref 26.0–34.0)
MCHC: 32.1 g/dL (ref 30.0–36.0)
MCV: 81.9 fL (ref 78.0–100.0)
Monocytes Absolute: 0.3 10*3/uL (ref 0.1–1.0)
Monocytes Relative: 3 % (ref 3–12)
NEUTROS ABS: 6.3 10*3/uL (ref 1.7–7.7)
Neutrophils Relative %: 83 % — ABNORMAL HIGH (ref 43–77)
PLATELETS: 353 10*3/uL (ref 150–400)
RBC: 4.26 MIL/uL (ref 3.87–5.11)
RDW: 14.7 % (ref 11.5–15.5)
WBC: 7.5 10*3/uL (ref 4.0–10.5)

## 2013-09-13 LAB — LIPASE, BLOOD: Lipase: 21 U/L (ref 11–59)

## 2013-09-13 LAB — URINALYSIS, ROUTINE W REFLEX MICROSCOPIC
Bilirubin Urine: NEGATIVE
GLUCOSE, UA: NEGATIVE mg/dL
HGB URINE DIPSTICK: NEGATIVE
KETONES UR: 40 mg/dL — AB
Leukocytes, UA: NEGATIVE
Nitrite: NEGATIVE
PROTEIN: NEGATIVE mg/dL
Specific Gravity, Urine: 1.041 — ABNORMAL HIGH (ref 1.005–1.030)
UROBILINOGEN UA: 0.2 mg/dL (ref 0.0–1.0)
pH: 8 (ref 5.0–8.0)

## 2013-09-13 LAB — D-DIMER, QUANTITATIVE: D-Dimer, Quant: 0.97 ug/mL-FEU — ABNORMAL HIGH (ref 0.00–0.48)

## 2013-09-13 LAB — TROPONIN I: Troponin I: <0.3 ng/mL (ref <0.30)

## 2013-09-13 MED ORDER — OMEPRAZOLE 20 MG PO CPDR: 20.0000 mg | DELAYED_RELEASE_CAPSULE | Freq: Two times a day (BID) | ORAL | Status: DC

## 2013-09-13 MED ORDER — ONDANSETRON HCL 4 MG/2ML IJ SOLN
4.0000 mg | Freq: Once | INTRAMUSCULAR | Status: AC
  Administered 2013-09-13: 4 mg via INTRAVENOUS
  Filled 2013-09-13: qty 2

## 2013-09-13 MED ORDER — PROMETHAZINE HCL 25 MG RE SUPP: 25.0000 mg | Freq: Four times a day (QID) | RECTAL | Status: DC | PRN

## 2013-09-13 MED ORDER — HYDROMORPHONE HCL PF 1 MG/ML IJ SOLN
1.0000 mg | Freq: Once | INTRAMUSCULAR | Status: AC
  Administered 2013-09-13: 1 mg via INTRAVENOUS
  Filled 2013-09-13: qty 1

## 2013-09-13 MED ORDER — PANTOPRAZOLE SODIUM 40 MG PO TBEC
40.0000 mg | DELAYED_RELEASE_TABLET | Freq: Every day | ORAL | Status: DC
  Administered 2013-09-13: 40 mg via ORAL
  Filled 2013-09-13: qty 1

## 2013-09-13 MED ORDER — OXYCODONE-ACETAMINOPHEN 5-325 MG PO TABS
2.0000 | ORAL_TABLET | Freq: Once | ORAL | Status: AC
  Administered 2013-09-13: 2 via ORAL
  Filled 2013-09-13: qty 2

## 2013-09-13 MED ORDER — IOHEXOL 350 MG/ML SOLN
100.0000 mL | Freq: Once | INTRAVENOUS | Status: AC | PRN
  Administered 2013-09-13: 100 mL via INTRAVENOUS

## 2013-09-13 MED ORDER — AZITHROMYCIN 250 MG PO TABS: 250.0000 mg | ORAL_TABLET | Freq: Every day | ORAL | Status: DC

## 2013-09-13 NOTE — ED Provider Notes (Signed)
CSN: [id number]     Arrival date & time 09/13/13  1209 History   First MD Initiated Contact with Patient 09/13/13 1239     Chief Complaint  Patient presents with  . Chest Pain     (Consider location/radiation/quality/duration/timing/severity/associated sxs/prior Treatment) Patient is a 48 y.o. female presenting with chest pain.  Chest Pain Pain location:  Substernal area Pain quality: sharp   Pain radiates to:  Does not radiate Pain severity:  Severe Onset quality:  Gradual Duration:  3 days Timing:  Constant Progression:  Worsening Chronicity:  New Context comment:  Worse with vomiting Relieved by: improved with medications given in ED.  No relief from zofran PO at home.  "I can't keep it down" Associated symptoms: abdominal pain, back pain, cough, nausea, shortness of breath and vomiting   Associated symptoms: no fever (+sweats and chills)     Past Medical History  Diagnosis Date  . Headache(784.0) 2011    Migraines  . Esophageal reflux     On omeprazole  . Costochondritis   . Hypertension     started around 2011  . Ovarian cyst    Past Surgical History  Procedure Laterality Date  . Oophorectomy      Around 1985 left ovary removal  . Tonsillectomy      Around 1994  . Tubal ligation     Family History  Problem Relation Age of Onset  . Hypertension Mother   . Diabetes Mellitus II Mother   . Hypertension Sister   . Hypertension Brother   . Heart failure Maternal Grandmother   . Hypertension Maternal Grandfather    History  Substance Use Topics  . Smoking status: Former Research scientist (life sciences)  . Smokeless tobacco: Never Used     Comment: Stopped 2004  . Alcohol Use: Yes     Comment: occ   OB History   Grav Para Term Preterm Abortions TAB SAB Ect Mult Living                 Review of Systems  Constitutional: Negative for fever (+sweats and chills).  Respiratory: Positive for cough and shortness of breath.   Cardiovascular: Positive for chest pain.   Gastrointestinal: Positive for nausea, vomiting and abdominal pain.  Musculoskeletal: Positive for back pain.  All other systems reviewed and are negative.     Allergies  Review of patient's allergies indicates no known allergies.  Home Medications   Prior to Admission medications   Medication Sig Start Date End Date Taking? Authorizing Provider  aspirin 81 MG tablet Take 81 mg by mouth daily.   Yes Historical Provider, MD  carvedilol (COREG) 6.25 MG tablet Take 6.25 mg by mouth 2 (two) times daily with a meal.   Yes Historical Provider, MD  cephALEXin (KEFLEX) 500 MG capsule  07/19/13  Yes Historical Provider, MD  cephALEXin (KEFLEX) 500 MG capsule Take 1 capsule (500 mg total) by mouth 4 (four) times daily. 09/12/13  Yes Blanchard Kelch, MD  cyclobenzaprine (FLEXERIL) 10 MG tablet Take 1 tablet (10 mg total) by mouth every 8 (eight) hours as needed for muscle spasms. 07/25/13  Yes Dene Gentry, MD  FLUoxetine (PROZAC) 20 MG capsule Take 1 capsule (20 mg total) by mouth daily. 08/15/13  Yes Dene Gentry, MD  HYDROcodone-acetaminophen (NORCO) 5-325 MG per tablet Take 1 tablet by mouth every 6 (six) hours as needed. 07/25/13  Yes Dene Gentry, MD  ibuprofen (ADVIL,MOTRIN) 600 MG tablet Take 1 tablet (600 mg total) by  mouth every 8 (eight) hours as needed. 07/25/13  Yes Dene Gentry, MD  omeprazole (PRILOSEC) 40 MG capsule Take 40 mg by mouth daily.   Yes Historical Provider, MD  ondansetron (ZOFRAN ODT) 4 MG disintegrating tablet Take 1 tablet (4 mg total) by mouth every 8 (eight) hours as needed for nausea or vomiting. 09/11/13  Yes Cordelia Poche, MD  oxyCODONE-acetaminophen (PERCOCET) 5-325 MG per tablet Take 1 tablet by mouth every 6 (six) hours as needed for moderate pain. 09/12/13  Yes Blanchard Kelch, MD  SUMAtriptan Succinate (SUMAVEL DOSEPRO) 6 MG/0.5ML DEVI Inject into the skin.   Yes Historical Provider, MD   BP 150/50  Pulse 81  Temp(Src) 98.7 F (37.1 C) (Oral)  Resp  19  Ht 5' (1.524 m)  Wt 150 lb (68.04 kg)  BMI 29.30 kg/m2  SpO2 99%  LMP 09/10/2013 Physical Exam  Nursing note and vitals reviewed. Constitutional: She is oriented to person, place, and time. She appears well-developed and well-nourished.  Non-toxic appearance. She appears ill. No distress.  HENT:  Head: Normocephalic and atraumatic.  Mouth/Throat: Oropharynx is clear and moist.  Eyes: Conjunctivae are normal. Pupils are equal, round, and reactive to light. No scleral icterus.  Neck: Neck supple.  Cardiovascular: Normal rate, regular rhythm, normal heart sounds and intact distal pulses.   No murmur heard. Pulmonary/Chest: Effort normal and breath sounds normal. No stridor. No respiratory distress. She has no wheezes. She has no rales.  Chest wall TTP diffusely  Abdominal: Soft. Bowel sounds are normal. She exhibits no distension. There is generalized tenderness. There is no rigidity, no rebound and no guarding.  Musculoskeletal: Normal range of motion.  TTP of bilateral flanks and mid back  Neurological: She is alert and oriented to person, place, and time.  Skin: Skin is warm. No rash noted.  Psychiatric: She has a normal mood and affect. Her behavior is normal.    ED Course  Procedures (including critical care time) Labs Review Labs Reviewed  CBC WITH DIFFERENTIAL - Abnormal; Notable for the following:    Hemoglobin 11.2 (*)    HCT 34.9 (*)    Neutrophils Relative % 83 (*)    All other components within normal limits  COMPREHENSIVE METABOLIC PANEL - Abnormal; Notable for the following:    Glucose, Bld 140 (*)    GFR calc non Af Amer 76 (*)    GFR calc Af Amer 88 (*)    Anion gap 17 (*)    All other components within normal limits  D-DIMER, QUANTITATIVE - Abnormal; Notable for the following:    D-Dimer, Quant 0.97 (*)    All other components within normal limits  LIPASE, BLOOD  TROPONIN I  URINALYSIS, ROUTINE W REFLEX MICROSCOPIC    Imaging Review Ct Abdomen  Pelvis Wo Contrast  09/13/2013   CLINICAL DATA:  Flank pain.  EXAM: CT ABDOMEN AND PELVIS WITHOUT CONTRAST  TECHNIQUE: Multidetector CT imaging of the abdomen and pelvis was performed following the standard protocol without IV contrast.  COMPARISON:  CT 09/11/2013.  FINDINGS: Liver normal. Spleen normal. Pancreas normal. No biliary distention. Contrast is noted within the gallbladder, most likely from recent contrast study. No gallbladder distention. No pericholecystic fluid collection.  Adrenals are normal. Kidneys are normal. No hydronephrosis or obstructing ureteral stone. Bladder is nondistended. Uterus and adnexa unremarkable. Bilateral tubal ligations. No free pelvic fluid.  No evidence of adenopathy.  Abdominal aorta normal in caliber.  Appendix normal. No evidence of bowel obstruction. No free  air. No mesenteric mass. No bowel herniation.  Mild infiltrate right lower lobe suggesting pneumonia. Mild cardiomegaly. No acute bony abnormality.  IMPRESSION: 1. Again noted is a mild infiltrate right lower lobe consistent pneumonia. 2. No acute intra abdominal abnormality identified.   Electronically Signed   By: Marcello Moores  Register   On: 09/13/2013 14:37   Dg Chest 2 View  09/12/2013   CLINICAL DATA:  chest pain  EXAM: CHEST  2 VIEW  COMPARISON:  None.  FINDINGS: The cardiac and mediastinal silhouettes are stable in size and contour, and remain within normal limits.  The lungs are normally inflated. No airspace consolidation, pleural effusion, or pulmonary edema is identified. There is no pneumothorax.  No acute osseous abnormality identified.  IMPRESSION: No active cardiopulmonary disease.   Electronically Signed   By: Jeannine Boga M.D.   On: 09/12/2013 15:51   Ct Angio Chest Pe W/cm &/or Wo Cm  09/13/2013   CLINICAL DATA:  48 year old female with chest pain shortness of Breath. Initial encounter.  EXAM: CT ANGIOGRAPHY CHEST WITH CONTRAST  TECHNIQUE: Multidetector CT imaging of the chest was performed  using the standard protocol during bolus administration of intravenous contrast. Multiplanar CT image reconstructions and MIPs were obtained to evaluate the vascular anatomy.  CONTRAST:  158mL OMNIPAQUE IOHEXOL 350 MG/ML SOLN  COMPARISON:  Chest radiographs 09/12/2013.  FINDINGS: Good contrast bolus timing in the pulmonary arterial tree.  No focal filling defect identified in the pulmonary arterial tree to suggest the presence of acute pulmonary embolism.  Notably, the right lower lobe pulmonary artery is diminutive compared to that on the left, and there is prominence of right phrenic arteries subjacent to the right hemidiaphragm. These may give collateral flow to the right lower lobe pleura.  Negative visualized aorta. Bovine arch configuration. No pericardial effusion. No pleural effusion. No mediastinal or hilar lymphadenopathy. No axillary lymphadenopathy. Negative thoracic inlet.  Major airways are patent. There is chronic scarring in the right lower lobe, suspicious for sequelae of a process such as previous cavitary or necrotizing pneumonia. Elsewhere the right lung is clear. The left lung is clear. No pleural effusion.  Prominence of the right phrenic artery as described above. Otherwise negative visualized liver, spleen, adrenal glands, left renal upper pole, and gastric fundus.  No acute osseous abnormality identified.  Review of the MIP images confirms the above findings.  IMPRESSION: 1.  No evidence of acute pulmonary embolus. 2. No acute findings identified in the chest. 3. Chronic changes to the right lower lobe with scarring which may explain diminutive right lower lobe pulmonary artery on the basis of chronic hypoxic vaso constriction. There are right phrenic artery collaterals, possibly to the pleura.   Electronically Signed   By: Lars Pinks M.D.   On: 09/13/2013 14:51   Ct Abdomen Pelvis W Contrast  09/11/2013   CLINICAL DATA:  Nausea and vomiting.  Abdominal pain.  EXAM: CT ABDOMEN AND PELVIS  WITH CONTRAST  TECHNIQUE: Multidetector CT imaging of the abdomen and pelvis was performed using the standard protocol following bolus administration of intravenous contrast.  CONTRAST:  75mL OMNIPAQUE IOHEXOL 300 MG/ML SOLN, 160mL OMNIPAQUE IOHEXOL 300 MG/ML SOLN  COMPARISON:  None.  FINDINGS: Bones: No aggressive osseous lesions. Lumbosacral transitional vertebra.  Lung Bases: Scarring is present in the RIGHT lower lobe extending to the lateral pleural surface. This extends to the RIGHT middle lobe as well. No pulmonary mass lesion.  Liver:  Normal.  Spleen:  Normal.  Gallbladder:  Normal.  No calcified gallstones.  Common bile duct:  Normal.  Pancreas:  Normal.  Adrenal glands:  Normal bilaterally.  Kidneys: Normal enhancement. There is early excretion of contrast from both kidneys. Tiny subcentimeter RIGHT interpolar low-density renal lesion likely represents a cyst. Both ureters appear within normal limits. Normal delayed excretion of contrast.  Stomach:  Normal.  Small bowel: Normal. No mesenteric adenopathy. No bowel obstruction.  Colon: Metallic density is present throughout the colon, compatible with ingested bismuth salts. No colonic obstruction or inflammatory changes.  Pelvic Genitourinary: Uterus appears within normal limits. Ovaries normal. Bladder collapsed.  Vasculature: Normal.  Body Wall: Normal.  IMPRESSION: No acute abnormality. RIGHT middle and RIGHT lower lobe pulmonary parenchymal scarring.   Electronically Signed   By: Dereck Ligas M.D.   On: 09/11/2013 20:53  All radiology studies independently viewed by me.      EKG Interpretation None      MDM   Final diagnoses:  Intractable vomiting with nausea, vomiting of unspecified type  Epigastric pain  CAP (community acquired pneumonia)    48 yo female presenting with multiple complaints including chest pain, vomiting, and back pain.  She has been evaluated in the ED twice in the past two days for similar symptoms without  definitive cause identified.  She is a poor historian, making evaluation difficult.  Review of systems was pan-positive.    On prior visits, she had unremarkable labs, unremarkable CT abd/pel with contrast.  Both days, she improved with IV medications.    On arrival, she was ill appearing and nauseated appearing.  She appeared uncomfortable and continually changed positions while in bed.  I suspected that she may have a kidney stone (her prior CT performed with contrast, limiting sensitivity, and she had large blood in prior UA).  Given her poor appearance, plan to recheck labs and perform additional tests.  Given IV dilaudid and IV zofran for symptom control.    CT questions possible pneumonia.  On recheck, pt appeared much better and reported feeling better.  She does endorse cough with purulent sputum, but describes it as occurred mostly when vomiting.  She hasn't had fevers, but has had severe sweats and chills. No leukocytosis.  We discussed antibiotic therapy versus supportive management, and she preferred antibiotics.  Will treat with azithromycin.  I suspect that her symptoms are most likely GI related, however.  Will treat with omeprazole.  Will prescribed phenergan suppositories as her nausea has been uncontrolled at home with PO zofran.  Will refer to GI.  Given return precautions.   Houston Siren III, MD 09/13/13 [phone]

## 2013-09-13 NOTE — ED Notes (Signed)
Patient transported to CT 

## 2013-09-13 NOTE — Discharge Instructions (Signed)
Gastritis, Adult °Gastritis is soreness and swelling (inflammation) of the lining of the stomach. Gastritis can develop as a sudden onset (acute) or long-term (chronic) condition. If gastritis is not treated, it can lead to stomach bleeding and ulcers. °CAUSES  °Gastritis occurs when the stomach lining is weak or damaged. Digestive juices from the stomach then inflame the weakened stomach lining. The stomach lining may be weak or damaged due to viral or bacterial infections. One common bacterial infection is the Helicobacter pylori infection. Gastritis can also result from excessive alcohol consumption, taking certain medicines, or having too much acid in the stomach.  °SYMPTOMS  °In some cases, there are no symptoms. When symptoms are present, they may include: °· Pain or a burning sensation in the upper abdomen. °· Nausea. °· Vomiting. °· An uncomfortable feeling of fullness after eating. °DIAGNOSIS  °Your caregiver may suspect you have gastritis based on your symptoms and a physical exam. To determine the cause of your gastritis, your caregiver may perform the following: °· Blood or stool tests to check for the H pylori bacterium. °· Gastroscopy. A thin, flexible tube (endoscope) is passed down the esophagus and into the stomach. The endoscope has a light and camera on the end. Your caregiver uses the endoscope to view the inside of the stomach. °· Taking a tissue sample (biopsy) from the stomach to examine under a microscope. °TREATMENT  °Depending on the cause of your gastritis, medicines may be prescribed. If you have a bacterial infection, such as an H pylori infection, antibiotics may be given. If your gastritis is caused by too much acid in the stomach, H2 blockers or antacids may be given. Your caregiver may recommend that you stop taking aspirin, ibuprofen, or other nonsteroidal anti-inflammatory drugs (NSAIDs). °HOME CARE INSTRUCTIONS °· Only take over-the-counter or prescription medicines as directed by  your caregiver. °· If you were given antibiotic medicines, take them as directed. Finish them even if you start to feel better. °· Drink enough fluids to keep your urine clear or pale yellow. °· Avoid foods and drinks that make your symptoms worse, such as: °¨ Caffeine or alcoholic drinks. °¨ Chocolate. °¨ Peppermint or mint flavorings. °¨ Garlic and onions. °¨ Spicy foods. °¨ Citrus fruits, such as oranges, lemons, or limes. °¨ Tomato-based foods such as sauce, chili, salsa, and pizza. °¨ Fried and fatty foods. °· Eat small, frequent meals instead of large meals. °SEEK IMMEDIATE MEDICAL CARE IF:  °· You have black or dark red stools. °· You vomit blood or material that looks like coffee grounds. °· You are unable to keep fluids down. °· Your abdominal pain gets worse. °· You have a fever. °· You do not feel better after 1 week. °· You have any other questions or concerns. °MAKE SURE YOU: °· Understand these instructions. °· Will watch your condition. °· Will get help right away if you are not doing well or get worse. °Document Released: 02/01/2001 Document Revised: 08/09/2011 Document Reviewed: 03/23/2011 °[employer]® Patient Information ©2015 [employer]. This information is not intended to replace advice given to you by your health care provider. Make sure you discuss any questions you have with your health care provider. ° °Nausea and Vomiting °Nausea is a sick feeling that often comes before throwing up (vomiting). Vomiting is a reflex where stomach contents come out of your mouth. Vomiting can cause severe loss of body fluids (dehydration). Children and elderly adults can become dehydrated quickly, especially if they also have diarrhea. Nausea and vomiting are   symptoms of a condition or disease. It is important to find the cause of your symptoms. CAUSES   Direct irritation of the stomach lining. This irritation can result from increased acid production (gastroesophageal reflux disease), infection, food  poisoning, taking certain medicines (such as nonsteroidal anti-inflammatory drugs), alcohol use, or tobacco use.  Signals from the brain.These signals could be caused by a headache, heat exposure, an inner ear disturbance, increased pressure in the brain from injury, infection, a tumor, or a concussion, pain, emotional stimulus, or metabolic problems.  An obstruction in the gastrointestinal tract (bowel obstruction).  Illnesses such as diabetes, hepatitis, gallbladder problems, appendicitis, kidney problems, cancer, sepsis, atypical symptoms of a heart attack, or eating disorders.  Medical treatments such as chemotherapy and radiation.  Receiving medicine that makes you sleep (general anesthetic) during surgery. DIAGNOSIS Your caregiver may ask for tests to be done if the problems do not improve after a few days. Tests may also be done if symptoms are severe or if the reason for the nausea and vomiting is not clear. Tests may include:  Urine tests.  Blood tests.  Stool tests.  Cultures (to look for evidence of infection).  X-rays or other imaging studies. Test results can help your caregiver make decisions about treatment or the need for additional tests. TREATMENT You need to stay well hydrated. Drink frequently but in small amounts.You may wish to drink water, sports drinks, clear broth, or eat frozen ice pops or gelatin dessert to help stay hydrated.When you eat, eating slowly may help prevent nausea.There are also some antinausea medicines that may help prevent nausea. HOME CARE INSTRUCTIONS   Take all medicine as directed by your caregiver.  If you do not have an appetite, do not force yourself to eat. However, you must continue to drink fluids.  If you have an appetite, eat a normal diet unless your caregiver tells you differently.  Eat a variety of complex carbohydrates (rice, wheat, potatoes, bread), lean meats, yogurt, fruits, and vegetables.  Avoid high-fat foods  because they are more difficult to digest.  Drink enough water and fluids to keep your urine clear or pale yellow.  If you are dehydrated, ask your caregiver for specific rehydration instructions. Signs of dehydration may include:  Severe thirst.  Dry lips and mouth.  Dizziness.  Dark urine.  Decreasing urine frequency and amount.  Confusion.  Rapid breathing or pulse. SEEK IMMEDIATE MEDICAL CARE IF:   You have blood or brown flecks (like coffee grounds) in your vomit.  You have black or bloody stools.  You have a severe headache or stiff neck.  You are confused.  You have severe abdominal pain.  You have chest pain or trouble breathing.  You do not urinate at least once every 8 hours.  You develop cold or clammy skin.  You continue to vomit for longer than 24 to 48 hours.  You have a fever. MAKE SURE YOU:   Understand these instructions.  Will watch your condition.  Will get help right away if you are not doing well or get worse. Document Released: 02/07/2005 Document Revised: 05/02/2011 Document Reviewed: 07/07/2010 [facility] Patient Information 2015 [place], [place]. This information is not intended to replace advice given to you by your health care provider. Make sure you discuss any questions you have with your health care provider.  Pneumonia Pneumonia is an infection of the lungs.  CAUSES Pneumonia may be caused by bacteria or a virus. Usually, these infections are caused by breathing infectious particles  into the lungs (respiratory tract). SIGNS AND SYMPTOMS   Cough.  Fever.  Chest pain.  Increased rate of breathing.  Wheezing.  Mucus production. DIAGNOSIS  If you have the common symptoms of pneumonia, your health care provider will typically confirm the diagnosis with a chest X-ray. The X-ray will show an abnormality in the lung (pulmonary infiltrate) if you have pneumonia. Other tests of your blood, urine, or sputum may be done to find the  specific cause of your pneumonia. Your health care provider may also do tests (blood gases or pulse oximetry) to see how well your lungs are working. TREATMENT  Some forms of pneumonia may be spread to other people when you cough or sneeze. You may be asked to wear a mask before and during your exam. Pneumonia that is caused by bacteria is treated with antibiotic medicine. Pneumonia that is caused by the influenza virus may be treated with an antiviral medicine. Most other viral infections must run their course. These infections will not respond to antibiotics.  HOME CARE INSTRUCTIONS   Cough suppressants may be used if you are losing too much rest. However, coughing protects you by clearing your lungs. You should avoid using cough suppressants if you can.  Your health care provider may have prescribed medicine if he or she thinks your pneumonia is caused by bacteria or influenza. Finish your medicine even if you start to feel better.  Your health care provider may also prescribe an expectorant. This loosens the mucus to be coughed up.  Take medicines only as directed by your health care provider.  Do not smoke. Smoking is a common cause of bronchitis and can contribute to pneumonia. If you are a smoker and continue to smoke, your cough may last several weeks after your pneumonia has cleared.  A cold steam vaporizer or humidifier in your room or home may help loosen mucus.  Coughing is often worse at night. Sleeping in a semi-upright position in a recliner or using a couple pillows under your head will help with this.  Get rest as you feel it is needed. Your body will usually let you know when you need to rest. PREVENTION A pneumococcal shot (vaccine) is available to prevent a common bacterial cause of pneumonia. This is usually suggested for:  People over 58 years old.  Patients on chemotherapy.  People with chronic lung problems, such as bronchitis or emphysema.  People with immune  system problems. If you are over 65 or have a high risk condition, you may receive the pneumococcal vaccine if you have not received it before. In some countries, a routine influenza vaccine is also recommended. This vaccine can help prevent some cases of pneumonia.You may be offered the influenza vaccine as part of your care. If you smoke, it is time to quit. You may receive instructions on how to stop smoking. Your health care provider can provide medicines and counseling to help you quit. SEEK MEDICAL CARE IF: You have a fever. SEEK IMMEDIATE MEDICAL CARE IF:   Your illness becomes worse. This is especially true if you are elderly or weakened from any other disease.  You cannot control your cough with suppressants and are losing sleep.  You begin coughing up blood.  You develop pain which is getting worse or is uncontrolled with medicines.  Any of the symptoms which initially brought you in for treatment are getting worse rather than better.  You develop shortness of breath or chest pain. MAKE SURE YOU:  Understand these instructions.  Will watch your condition.  Will get help right away if you are not doing well or get worse. Document Released: 02/07/2005 Document Revised: 06/24/2013 Document Reviewed: 04/29/2010 [facility] Patient Information 2015 [place], [place]. This information is not intended to replace advice given to you by your health care provider. Make sure you discuss any questions you have with your health care provider.

## 2013-09-13 NOTE — ED Notes (Signed)
Pt reports chest pain since 7/20 - reports being seen at ED for the past 3 days for same s/s - c/o nausea, chest pain that worsened this morning. EKG obtained.

## 2013-09-13 NOTE — ED Notes (Signed)
Pt refused to take meds until she had crackers and sprite.  Now tolerating sprite and crackers

## 2013-09-13 NOTE — ED Provider Notes (Signed)
I saw and evaluated the patient, reviewed the resident's note and I agree with the findings and plan.   EKG Interpretation   Date/Time:  Wednesday September 11 2013 21:24:03 EDT Ventricular Rate:  69 PR Interval:  130 QRS Duration: 84 QT Interval:  386 QTC Calculation: 413 R Axis:   40 Text Interpretation:  Normal sinus rhythm Minimal voltage criteria for  LVH, may be normal variant Borderline ECG No prior Confirmed by Mingo Amber   MD, [place] ([id number]) on 09/11/2013 9:40:56 PM      Patient here with N/V, acute onset. Belly exam with generalized pain. No subcutaneous emphysema in her chest, lungs clear. EKG ok. CT ok. Labs ok. Feeling much better, stable for discharge.  Osvaldo Shipper, MD 09/13/13 2126

## 2013-09-14 ENCOUNTER — Inpatient Hospital Stay (HOSPITAL_COMMUNITY)
Admission: EM | Admit: 2013-09-14 | Discharge: 2013-10-13 | DRG: 326 | Disposition: A | Discharge status: Home Health | Payer: No Typology Code available for payment source | Attending: Internal Medicine | Admitting: Internal Medicine

## 2013-09-14 ENCOUNTER — Encounter (HOSPITAL_COMMUNITY): Payer: Self-pay | Admitting: Emergency Medicine

## 2013-09-14 DIAGNOSIS — S065X0A Traumatic subdural hemorrhage without loss of consciousness, initial encounter: Secondary | ICD-10-CM | POA: Diagnosis present

## 2013-09-14 DIAGNOSIS — R0789 Other chest pain: Secondary | ICD-10-CM | POA: Diagnosis not present

## 2013-09-14 DIAGNOSIS — F4310 Post-traumatic stress disorder, unspecified: Secondary | ICD-10-CM

## 2013-09-14 DIAGNOSIS — Z8249 Family history of ischemic heart disease and other diseases of the circulatory system: Secondary | ICD-10-CM

## 2013-09-14 DIAGNOSIS — I498 Other specified cardiac arrhythmias: Secondary | ICD-10-CM | POA: Diagnosis present

## 2013-09-14 DIAGNOSIS — C169 Malignant neoplasm of stomach, unspecified: Secondary | ICD-10-CM | POA: Diagnosis present

## 2013-09-14 DIAGNOSIS — K929 Disease of digestive system, unspecified: Secondary | ICD-10-CM | POA: Diagnosis not present

## 2013-09-14 DIAGNOSIS — Z9119 Patient's noncompliance with other medical treatment and regimen: Secondary | ICD-10-CM

## 2013-09-14 DIAGNOSIS — K567 Ileus, unspecified: Secondary | ICD-10-CM

## 2013-09-14 DIAGNOSIS — K259 Gastric ulcer, unspecified as acute or chronic, without hemorrhage or perforation: Secondary | ICD-10-CM | POA: Diagnosis present

## 2013-09-14 DIAGNOSIS — K253 Acute gastric ulcer without hemorrhage or perforation: Secondary | ICD-10-CM

## 2013-09-14 DIAGNOSIS — R112 Nausea with vomiting, unspecified: Secondary | ICD-10-CM

## 2013-09-14 DIAGNOSIS — R1115 Cyclical vomiting syndrome unrelated to migraine: Secondary | ICD-10-CM | POA: Diagnosis present

## 2013-09-14 DIAGNOSIS — Z91199 Patient's noncompliance with other medical treatment and regimen due to unspecified reason: Secondary | ICD-10-CM

## 2013-09-14 DIAGNOSIS — G43909 Migraine, unspecified, not intractable, without status migrainosus: Secondary | ICD-10-CM | POA: Diagnosis present

## 2013-09-14 DIAGNOSIS — K9189 Other postprocedural complications and disorders of digestive system: Secondary | ICD-10-CM

## 2013-09-14 DIAGNOSIS — Z87891 Personal history of nicotine dependence: Secondary | ICD-10-CM

## 2013-09-14 DIAGNOSIS — C772 Secondary and unspecified malignant neoplasm of intra-abdominal lymph nodes: Secondary | ICD-10-CM | POA: Diagnosis present

## 2013-09-14 DIAGNOSIS — K560 Paralytic ileus: Secondary | ICD-10-CM | POA: Diagnosis not present

## 2013-09-14 DIAGNOSIS — Z833 Family history of diabetes mellitus: Secondary | ICD-10-CM

## 2013-09-14 DIAGNOSIS — T7492XA Unspecified child maltreatment, confirmed, initial encounter: Secondary | ICD-10-CM | POA: Diagnosis present

## 2013-09-14 DIAGNOSIS — Z800 Family history of malignant neoplasm of digestive organs: Secondary | ICD-10-CM

## 2013-09-14 DIAGNOSIS — T7491XA Unspecified adult maltreatment, confirmed, initial encounter: Secondary | ICD-10-CM | POA: Diagnosis present

## 2013-09-14 DIAGNOSIS — R32 Unspecified urinary incontinence: Secondary | ICD-10-CM | POA: Diagnosis present

## 2013-09-14 DIAGNOSIS — E440 Moderate protein-calorie malnutrition: Secondary | ICD-10-CM | POA: Diagnosis present

## 2013-09-14 DIAGNOSIS — R1110 Vomiting, unspecified: Secondary | ICD-10-CM | POA: Diagnosis present

## 2013-09-14 DIAGNOSIS — Z803 Family history of malignant neoplasm of breast: Secondary | ICD-10-CM

## 2013-09-14 DIAGNOSIS — R079 Chest pain, unspecified: Secondary | ICD-10-CM | POA: Diagnosis present

## 2013-09-14 DIAGNOSIS — M545 Low back pain, unspecified: Secondary | ICD-10-CM | POA: Diagnosis present

## 2013-09-14 DIAGNOSIS — IMO0002 Reserved for concepts with insufficient information to code with codable children: Secondary | ICD-10-CM | POA: Diagnosis present

## 2013-09-14 DIAGNOSIS — D649 Anemia, unspecified: Secondary | ICD-10-CM | POA: Diagnosis present

## 2013-09-14 DIAGNOSIS — G43819 Other migraine, intractable, without status migrainosus: Secondary | ICD-10-CM

## 2013-09-14 DIAGNOSIS — E876 Hypokalemia: Secondary | ICD-10-CM | POA: Diagnosis not present

## 2013-09-14 DIAGNOSIS — Z7982 Long term (current) use of aspirin: Secondary | ICD-10-CM

## 2013-09-14 DIAGNOSIS — I609 Nontraumatic subarachnoid hemorrhage, unspecified: Secondary | ICD-10-CM

## 2013-09-14 DIAGNOSIS — M549 Dorsalgia, unspecified: Secondary | ICD-10-CM | POA: Diagnosis present

## 2013-09-14 DIAGNOSIS — C163 Malignant neoplasm of pyloric antrum: Principal | ICD-10-CM | POA: Diagnosis present

## 2013-09-14 DIAGNOSIS — Z79899 Other long term (current) drug therapy: Secondary | ICD-10-CM

## 2013-09-14 DIAGNOSIS — I10 Essential (primary) hypertension: Secondary | ICD-10-CM | POA: Diagnosis present

## 2013-09-14 DIAGNOSIS — Z6825 Body mass index (BMI) 25.0-25.9, adult: Secondary | ICD-10-CM

## 2013-09-14 DIAGNOSIS — K219 Gastro-esophageal reflux disease without esophagitis: Secondary | ICD-10-CM | POA: Diagnosis present

## 2013-09-14 LAB — CBC
HEMATOCRIT: 36.6 % (ref 36.0–46.0)
Hemoglobin: 11.6 g/dL — ABNORMAL LOW (ref 12.0–15.0)
MCH: 26 pg (ref 26.0–34.0)
MCHC: 31.7 g/dL (ref 30.0–36.0)
MCV: 82.1 fL (ref 78.0–100.0)
Platelets: 395 10*3/uL (ref 150–400)
RBC: 4.46 MIL/uL (ref 3.87–5.11)
RDW: 14.8 % (ref 11.5–15.5)
WBC: 10 10*3/uL (ref 4.0–10.5)

## 2013-09-14 LAB — COMPREHENSIVE METABOLIC PANEL
ALT: 10 U/L (ref 0–35)
ANION GAP: 16 — AB (ref 5–15)
AST: 18 U/L (ref 0–37)
Albumin: 4.5 g/dL (ref 3.5–5.2)
Alkaline Phosphatase: 70 U/L (ref 39–117)
BUN: 11 mg/dL (ref 6–23)
CO2: 25 meq/L (ref 19–32)
CREATININE: 1 mg/dL (ref 0.50–1.10)
Calcium: 9.4 mg/dL (ref 8.4–10.5)
Chloride: 96 mEq/L (ref 96–112)
GFR calc Af Amer: 77 mL/min — ABNORMAL LOW (ref >90)
GFR, EST NON AFRICAN AMERICAN: 66 mL/min — AB (ref >90)
Glucose, Bld: 138 mg/dL — ABNORMAL HIGH (ref 70–99)
Potassium: 3.6 mEq/L — ABNORMAL LOW (ref 3.7–5.3)
Sodium: 137 mEq/L (ref 137–147)
Total Bilirubin: 0.4 mg/dL (ref 0.3–1.2)
Total Protein: 8.4 g/dL — ABNORMAL HIGH (ref 6.0–8.3)

## 2013-09-14 LAB — I-STAT CG4 LACTIC ACID, ED: LACTIC ACID, VENOUS: 2.63 mmol/L — AB (ref 0.5–2.2)

## 2013-09-14 LAB — TROPONIN I: Troponin I: <0.3 ng/mL (ref <0.30)

## 2013-09-14 LAB — LIPASE, BLOOD: Lipase: 26 U/L (ref 11–59)

## 2013-09-14 MED ORDER — KETOROLAC TROMETHAMINE 30 MG/ML IJ SOLN
30.0000 mg | Freq: Three times a day (TID) | INTRAMUSCULAR | Status: DC
  Administered 2013-09-15 (×2): 30 mg via INTRAVENOUS
  Filled 2013-09-14 (×3): qty 1

## 2013-09-14 MED ORDER — ASPIRIN 325 MG PO TABS
325.0000 mg | ORAL_TABLET | Freq: Once | ORAL | Status: AC
  Administered 2013-09-14: 325 mg via ORAL
  Filled 2013-09-14: qty 1

## 2013-09-14 MED ORDER — OXYCODONE HCL 5 MG PO TABS
5.0000 mg | ORAL_TABLET | ORAL | Status: DC | PRN
  Administered 2013-09-15 – 2013-09-23 (×14): 5 mg via ORAL
  Filled 2013-09-14 (×14): qty 1

## 2013-09-14 MED ORDER — ALBUTEROL SULFATE (2.5 MG/3ML) 0.083% IN NEBU: 2.5000 mg | INHALATION_SOLUTION | RESPIRATORY_TRACT | Status: DC | PRN

## 2013-09-14 MED ORDER — ONDANSETRON HCL 4 MG/2ML IJ SOLN
4.0000 mg | Freq: Once | INTRAMUSCULAR | Status: AC
  Administered 2013-09-14: 4 mg via INTRAVENOUS
  Filled 2013-09-14: qty 2

## 2013-09-14 MED ORDER — MORPHINE SULFATE 2 MG/ML IJ SOLN
2.0000 mg | INTRAMUSCULAR | Status: DC | PRN
  Administered 2013-09-15 – 2013-09-19 (×14): 2 mg via INTRAVENOUS
  Filled 2013-09-14 (×16): qty 1

## 2013-09-14 MED ORDER — SODIUM CHLORIDE 0.9 % IV BOLUS (SEPSIS)
1000.0000 mL | Freq: Once | INTRAVENOUS | Status: AC
  Administered 2013-09-14: 1000 mL via INTRAVENOUS

## 2013-09-14 MED ORDER — ALPRAZOLAM 0.25 MG PO TABS
0.2500 mg | ORAL_TABLET | Freq: Three times a day (TID) | ORAL | Status: DC | PRN
  Administered 2013-09-15 – 2013-10-09 (×5): 0.25 mg via ORAL
  Filled 2013-09-14 (×7): qty 1

## 2013-09-14 MED ORDER — PROMETHAZINE HCL 25 MG/ML IJ SOLN
25.0000 mg | Freq: Four times a day (QID) | INTRAMUSCULAR | Status: DC | PRN
  Administered 2013-09-15: 25 mg via INTRAVENOUS
  Filled 2013-09-14: qty 1

## 2013-09-14 MED ORDER — CARVEDILOL 6.25 MG PO TABS
6.2500 mg | ORAL_TABLET | Freq: Two times a day (BID) | ORAL | Status: DC
  Administered 2013-09-15 – 2013-09-16 (×3): 6.25 mg via ORAL
  Filled 2013-09-14 (×5): qty 1

## 2013-09-14 MED ORDER — ACETAMINOPHEN 325 MG PO TABS
650.0000 mg | ORAL_TABLET | Freq: Four times a day (QID) | ORAL | Status: DC | PRN
  Administered 2013-09-15 – 2013-09-25 (×2): 650 mg via ORAL
  Filled 2013-09-14 (×2): qty 2

## 2013-09-14 MED ORDER — HYDROMORPHONE HCL PF 1 MG/ML IJ SOLN
1.0000 mg | Freq: Once | INTRAMUSCULAR | Status: AC
  Administered 2013-09-14: 1 mg via INTRAVENOUS
  Filled 2013-09-14: qty 1

## 2013-09-14 MED ORDER — ONDANSETRON HCL 4 MG/2ML IJ SOLN
4.0000 mg | Freq: Four times a day (QID) | INTRAMUSCULAR | Status: DC
  Administered 2013-09-15 – 2013-09-16 (×7): 4 mg via INTRAVENOUS
  Filled 2013-09-14 (×7): qty 2

## 2013-09-14 MED ORDER — NITROGLYCERIN 2 % TD OINT
1.0000 [in_us] | TOPICAL_OINTMENT | Freq: Once | TRANSDERMAL | Status: AC
  Administered 2013-09-14: 1 [in_us] via TOPICAL
  Filled 2013-09-14: qty 30

## 2013-09-14 MED ORDER — ENOXAPARIN SODIUM 40 MG/0.4ML ~~LOC~~ SOLN
40.0000 mg | Freq: Every day | SUBCUTANEOUS | Status: DC
  Administered 2013-09-15 (×2): 40 mg via SUBCUTANEOUS
  Filled 2013-09-14 (×3): qty 0.4

## 2013-09-14 MED ORDER — PANTOPRAZOLE SODIUM 40 MG IV SOLR
40.0000 mg | Freq: Two times a day (BID) | INTRAVENOUS | Status: DC
  Administered 2013-09-15 – 2013-10-01 (×34): 40 mg via INTRAVENOUS
  Filled 2013-09-14 (×36): qty 40

## 2013-09-14 MED ORDER — NITROGLYCERIN 0.4 MG SL SUBL
0.4000 mg | SUBLINGUAL_TABLET | SUBLINGUAL | Status: AC | PRN
  Administered 2013-09-14 (×3): 0.4 mg via SUBLINGUAL
  Filled 2013-09-14: qty 1

## 2013-09-14 MED ORDER — ACETAMINOPHEN 650 MG RE SUPP: 650.0000 mg | Freq: Four times a day (QID) | RECTAL | Status: DC | PRN

## 2013-09-14 MED ORDER — GUAIFENESIN-DM 100-10 MG/5ML PO SYRP: 5.0000 mL | ORAL_SOLUTION | ORAL | Status: DC | PRN

## 2013-09-14 MED ORDER — ASPIRIN EC 325 MG PO TBEC
325.0000 mg | DELAYED_RELEASE_TABLET | Freq: Every day | ORAL | Status: DC
  Administered 2013-09-15: 325 mg via ORAL
  Filled 2013-09-14 (×2): qty 1

## 2013-09-14 MED ORDER — SODIUM CHLORIDE 0.9 % IV SOLN
INTRAVENOUS | Status: DC
  Administered 2013-09-14 – 2013-09-20 (×15): via INTRAVENOUS

## 2013-09-14 NOTE — ED Notes (Signed)
Patient rated chest pain (2)

## 2013-09-14 NOTE — ED Provider Notes (Signed)
CSN: [id number]     Arrival date & time 09/14/13  2021 History   First MD Initiated Contact with Patient 09/14/13 2037     Chief Complaint  Patient presents with  . Emesis     (Consider location/radiation/quality/duration/timing/severity/associated sxs/prior Treatment) Patient is a 48 y.o. female presenting with vomiting. The history is provided by the patient.  Emesis Severity:  Moderate Duration:  4 days Timing:  Constant Quality:  Stomach contents Progression:  Worsening Chronicity:  Recurrent Recent urination:  Normal Context: not post-tussive   Relieved by:  Nothing Worsened by:  Nothing tried Associated symptoms: no abdominal pain, no chills, no fever, no headaches and no URI     Past Medical History  Diagnosis Date  . Headache(784.0) 2011    Migraines  . Esophageal reflux     On omeprazole  . Costochondritis   . Hypertension     started around 2011  . Ovarian cyst    Past Surgical History  Procedure Laterality Date  . Oophorectomy      Around 1985 left ovary removal  . Tonsillectomy      Around 1994  . Tubal ligation     Family History  Problem Relation Age of Onset  . Hypertension Mother   . Diabetes Mellitus II Mother   . Hypertension Sister   . Hypertension Brother   . Heart failure Maternal Grandmother   . Hypertension Maternal Grandfather    History  Substance Use Topics  . Smoking status: Former Research scientist (life sciences)  . Smokeless tobacco: Never Used     Comment: Stopped 2004  . Alcohol Use: Yes     Comment: occ   OB History   Grav Para Term Preterm Abortions TAB SAB Ect Mult Living                 Review of Systems  Constitutional: Negative for fever and chills.  Respiratory: Negative for cough and shortness of breath.   Cardiovascular: Negative for chest pain and leg swelling.  Gastrointestinal: Negative for vomiting and abdominal pain.  Neurological: Negative for headaches.  All other systems reviewed and are negative.     Allergies   Review of patient's allergies indicates no known allergies.  Home Medications   Prior to Admission medications   Medication Sig Start Date End Date Taking? Authorizing Provider  aspirin 81 MG tablet Take 81 mg by mouth daily.    Historical Provider, MD  azithromycin (ZITHROMAX) 250 MG tablet Take 1 tablet (250 mg total) by mouth daily. Take first 2 tablets together, then 1 every day until finished. 09/13/13   Houston Siren III, MD  carvedilol (COREG) 6.25 MG tablet Take 6.25 mg by mouth 2 (two) times daily with a meal.    Historical Provider, MD  cephALEXin (KEFLEX) 500 MG capsule  07/19/13   Historical Provider, MD  cephALEXin (KEFLEX) 500 MG capsule Take 1 capsule (500 mg total) by mouth 4 (four) times daily. 09/12/13   Blanchard Kelch, MD  cyclobenzaprine (FLEXERIL) 10 MG tablet Take 1 tablet (10 mg total) by mouth every 8 (eight) hours as needed for muscle spasms. 07/25/13   Dene Gentry, MD  FLUoxetine (PROZAC) 20 MG capsule Take 1 capsule (20 mg total) by mouth daily. 08/15/13   Dene Gentry, MD  HYDROcodone-acetaminophen (NORCO) 5-325 MG per tablet Take 1 tablet by mouth every 6 (six) hours as needed. 07/25/13   Dene Gentry, MD  ibuprofen (ADVIL,MOTRIN) 600 MG tablet Take 1 tablet (600 mg  total) by mouth every 8 (eight) hours as needed. 07/25/13   Dene Gentry, MD  omeprazole (PRILOSEC) 20 MG capsule Take 1 capsule (20 mg total) by mouth 2 (two) times daily. 09/13/13   Houston Siren III, MD  ondansetron (ZOFRAN ODT) 4 MG disintegrating tablet Take 1 tablet (4 mg total) by mouth every 8 (eight) hours as needed for nausea or vomiting. 09/11/13   Cordelia Poche, MD  oxyCODONE-acetaminophen (PERCOCET) 5-325 MG per tablet Take 1 tablet by mouth every 6 (six) hours as needed for moderate pain. 09/12/13   Blanchard Kelch, MD  promethazine (PHENERGAN) 25 MG suppository Place 1 suppository (25 mg total) rectally every 6 (six) hours as needed for nausea or vomiting. 09/13/13   Houston Siren III, MD  SUMAtriptan Succinate (SUMAVEL DOSEPRO) 6 MG/0.5ML DEVI Inject into the skin.    Historical Provider, MD   BP 159/88  Pulse 77  Temp(Src) 98.9 F (37.2 C) (Oral)  Resp 20  Wt 155 lb (70.308 kg)  SpO2 98%  LMP 09/10/2013 Physical Exam  Nursing note and vitals reviewed. Constitutional: She is oriented to person, place, and time. She appears well-developed and well-nourished. No distress.  HENT:  Head: Normocephalic and atraumatic.  Mouth/Throat: Oropharynx is clear and moist.  Eyes: EOM are normal. Pupils are equal, round, and reactive to light.  Neck: Normal range of motion. Neck supple.  Cardiovascular: Normal rate and regular rhythm.  Exam reveals no friction rub.   No murmur heard. Pulmonary/Chest: Effort normal and breath sounds normal. No respiratory distress. She has no wheezes. She has no rales.  Abdominal: Soft. She exhibits no distension. There is tenderness (epigastric pain). There is no rebound.  Musculoskeletal: Normal range of motion. She exhibits no edema.  Neurological: She is alert and oriented to person, place, and time.  Skin: No rash noted. She is not diaphoretic.    ED Course  Procedures (including critical care time) Labs Review Labs Reviewed  CBC  COMPREHENSIVE METABOLIC PANEL  LIPASE, BLOOD  TROPONIN I  I-STAT CG4 LACTIC ACID, ED    Imaging Review Ct Abdomen Pelvis Wo Contrast  09/13/2013   CLINICAL DATA:  Flank pain.  EXAM: CT ABDOMEN AND PELVIS WITHOUT CONTRAST  TECHNIQUE: Multidetector CT imaging of the abdomen and pelvis was performed following the standard protocol without IV contrast.  COMPARISON:  CT 09/11/2013.  FINDINGS: Liver normal. Spleen normal. Pancreas normal. No biliary distention. Contrast is noted within the gallbladder, most likely from recent contrast study. No gallbladder distention. No pericholecystic fluid collection.  Adrenals are normal. Kidneys are normal. No hydronephrosis or obstructing ureteral stone.  Bladder is nondistended. Uterus and adnexa unremarkable. Bilateral tubal ligations. No free pelvic fluid.  No evidence of adenopathy.  Abdominal aorta normal in caliber.  Appendix normal. No evidence of bowel obstruction. No free air. No mesenteric mass. No bowel herniation.  Mild infiltrate right lower lobe suggesting pneumonia. Mild cardiomegaly. No acute bony abnormality.  IMPRESSION: 1. Again noted is a mild infiltrate right lower lobe consistent pneumonia. 2. No acute intra abdominal abnormality identified.   Electronically Signed   By: Marcello Moores  Register   On: 09/13/2013 14:37   Ct Angio Chest Pe W/cm &/or Wo Cm  09/13/2013   CLINICAL DATA:  48 year old female with chest pain shortness of Breath. Initial encounter.  EXAM: CT ANGIOGRAPHY CHEST WITH CONTRAST  TECHNIQUE: Multidetector CT imaging of the chest was performed using the standard protocol during bolus administration of intravenous contrast. Multiplanar CT image  reconstructions and MIPs were obtained to evaluate the vascular anatomy.  CONTRAST:  173mL OMNIPAQUE IOHEXOL 350 MG/ML SOLN  COMPARISON:  Chest radiographs 09/12/2013.  FINDINGS: Good contrast bolus timing in the pulmonary arterial tree.  No focal filling defect identified in the pulmonary arterial tree to suggest the presence of acute pulmonary embolism.  Notably, the right lower lobe pulmonary artery is diminutive compared to that on the left, and there is prominence of right phrenic arteries subjacent to the right hemidiaphragm. These may give collateral flow to the right lower lobe pleura.  Negative visualized aorta. Bovine arch configuration. No pericardial effusion. No pleural effusion. No mediastinal or hilar lymphadenopathy. No axillary lymphadenopathy. Negative thoracic inlet.  Major airways are patent. There is chronic scarring in the right lower lobe, suspicious for sequelae of a process such as previous cavitary or necrotizing pneumonia. Elsewhere the right lung is clear. The left  lung is clear. No pleural effusion.  Prominence of the right phrenic artery as described above. Otherwise negative visualized liver, spleen, adrenal glands, left renal upper pole, and gastric fundus.  No acute osseous abnormality identified.  Review of the MIP images confirms the above findings.  IMPRESSION: 1.  No evidence of acute pulmonary embolus. 2. No acute findings identified in the chest. 3. Chronic changes to the right lower lobe with scarring which may explain diminutive right lower lobe pulmonary artery on the basis of chronic hypoxic vaso constriction. There are right phrenic artery collaterals, possibly to the pleura.   Electronically Signed   By: Lars Pinks M.D.   On: 09/13/2013 14:51     EKG Interpretation   Date/Time:  Saturday September 14 2013 21:01:21 EDT Ventricular Rate:  97 PR Interval:  122 QRS Duration: 75 QT Interval:  358 QTC Calculation: 455 R Axis:   64 Text Interpretation:  Sinus rhythm Probable LVH with secondary repol abnrm  Similar to prior Confirmed by Mingo Amber  MD, Benna Arno ([id number]) on 09/14/2013  9:10:49 PM      MDM   Final diagnoses:  Other chest pain  Non-intractable vomiting with nausea, vomiting of unspecified type    48 year old female presents with fourth visit E. date ED in 4 days for vomiting. She's had CT scan of her abdomen and pelvis, CT PE study, multiple laboratory workups or vomiting. She seems to get her symptom control in the ED but then will return home and continue vomiting. She has moderately relief with Phenergan suppositories but continues to vomit. She is also having some chest heaviness for the past few days. I saw her on her initial visit, she had a normal CT scan, EKG, labs at that time and was feeling better and ready to go home after medicines. Here epigastric pain, belly otherwise unremarkable. Lungs clear. EKG similar to prior. Will check labs. Plan for admission for continued ED visits. CP improving with NTG and Dilaudid. Admitted by Dr.  Sloan Leiter.   Osvaldo Shipper, MD 09/14/13 2216
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
## 2013-09-14 NOTE — ED Notes (Signed)
Patient wanted a password so that sons can call and get information on her when they are not here at the hospital. Patient says it is ok for them to get information on her. The password is Garret Reddish)

## 2013-09-14 NOTE — ED Notes (Signed)
Patient is rating chest pain (3)

## 2013-09-14 NOTE — ED Notes (Signed)
Patient is still having chest pain rating (3)

## 2013-09-14 NOTE — ED Notes (Signed)
Requested patient to urinate. 

## 2013-09-14 NOTE — ED Notes (Signed)
Pt c/o emesis x 4 days with chest discomfort. Pt has had work ups x 3 days at [facility], pt refused to go [facility] today. Pt requesting shot for pain in triage.

## 2013-09-14 NOTE — ED Notes (Signed)
Patient is currently laying back relaxing with eyes close.

## 2013-09-14 NOTE — ED Notes (Signed)
Patient chest pain is rated at 3 per patient

## 2013-09-15 ENCOUNTER — Observation Stay (HOSPITAL_COMMUNITY): Payer: No Typology Code available for payment source

## 2013-09-15 DIAGNOSIS — E876 Hypokalemia: Secondary | ICD-10-CM | POA: Diagnosis not present

## 2013-09-15 DIAGNOSIS — M545 Low back pain, unspecified: Secondary | ICD-10-CM | POA: Diagnosis present

## 2013-09-15 DIAGNOSIS — Z6825 Body mass index (BMI) 25.0-25.9, adult: Secondary | ICD-10-CM | POA: Diagnosis not present

## 2013-09-15 DIAGNOSIS — I498 Other specified cardiac arrhythmias: Secondary | ICD-10-CM | POA: Diagnosis present

## 2013-09-15 DIAGNOSIS — R32 Unspecified urinary incontinence: Secondary | ICD-10-CM | POA: Diagnosis present

## 2013-09-15 DIAGNOSIS — K219 Gastro-esophageal reflux disease without esophagitis: Secondary | ICD-10-CM | POA: Diagnosis present

## 2013-09-15 DIAGNOSIS — C772 Secondary and unspecified malignant neoplasm of intra-abdominal lymph nodes: Secondary | ICD-10-CM | POA: Diagnosis present

## 2013-09-15 DIAGNOSIS — G43909 Migraine, unspecified, not intractable, without status migrainosus: Secondary | ICD-10-CM | POA: Diagnosis present

## 2013-09-15 DIAGNOSIS — K560 Paralytic ileus: Secondary | ICD-10-CM | POA: Diagnosis not present

## 2013-09-15 DIAGNOSIS — Z91199 Patient's noncompliance with other medical treatment and regimen due to unspecified reason: Secondary | ICD-10-CM | POA: Diagnosis not present

## 2013-09-15 DIAGNOSIS — Z803 Family history of malignant neoplasm of breast: Secondary | ICD-10-CM | POA: Diagnosis not present

## 2013-09-15 DIAGNOSIS — K259 Gastric ulcer, unspecified as acute or chronic, without hemorrhage or perforation: Secondary | ICD-10-CM | POA: Diagnosis present

## 2013-09-15 DIAGNOSIS — Z800 Family history of malignant neoplasm of digestive organs: Secondary | ICD-10-CM | POA: Diagnosis not present

## 2013-09-15 DIAGNOSIS — Z833 Family history of diabetes mellitus: Secondary | ICD-10-CM | POA: Diagnosis not present

## 2013-09-15 DIAGNOSIS — Z7982 Long term (current) use of aspirin: Secondary | ICD-10-CM | POA: Diagnosis not present

## 2013-09-15 DIAGNOSIS — Z79899 Other long term (current) drug therapy: Secondary | ICD-10-CM | POA: Diagnosis not present

## 2013-09-15 DIAGNOSIS — S065X0A Traumatic subdural hemorrhage without loss of consciousness, initial encounter: Secondary | ICD-10-CM | POA: Diagnosis present

## 2013-09-15 DIAGNOSIS — I10 Essential (primary) hypertension: Secondary | ICD-10-CM | POA: Diagnosis present

## 2013-09-15 DIAGNOSIS — D649 Anemia, unspecified: Secondary | ICD-10-CM | POA: Diagnosis present

## 2013-09-15 DIAGNOSIS — Z8249 Family history of ischemic heart disease and other diseases of the circulatory system: Secondary | ICD-10-CM | POA: Diagnosis not present

## 2013-09-15 DIAGNOSIS — IMO0002 Reserved for concepts with insufficient information to code with codable children: Secondary | ICD-10-CM | POA: Diagnosis present

## 2013-09-15 DIAGNOSIS — C163 Malignant neoplasm of pyloric antrum: Secondary | ICD-10-CM | POA: Diagnosis present

## 2013-09-15 DIAGNOSIS — E440 Moderate protein-calorie malnutrition: Secondary | ICD-10-CM | POA: Diagnosis present

## 2013-09-15 DIAGNOSIS — R1115 Cyclical vomiting syndrome unrelated to migraine: Secondary | ICD-10-CM | POA: Diagnosis present

## 2013-09-15 DIAGNOSIS — R0789 Other chest pain: Secondary | ICD-10-CM | POA: Diagnosis present

## 2013-09-15 DIAGNOSIS — Z87891 Personal history of nicotine dependence: Secondary | ICD-10-CM | POA: Diagnosis not present

## 2013-09-15 DIAGNOSIS — K929 Disease of digestive system, unspecified: Secondary | ICD-10-CM | POA: Diagnosis not present

## 2013-09-15 LAB — RAPID URINE DRUG SCREEN, HOSP PERFORMED
AMPHETAMINES: NOT DETECTED
Barbiturates: NOT DETECTED
Benzodiazepines: NOT DETECTED
Cocaine: NOT DETECTED
OPIATES: POSITIVE — AB
TETRAHYDROCANNABINOL: NOT DETECTED

## 2013-09-15 LAB — BASIC METABOLIC PANEL
ANION GAP: 13 (ref 5–15)
BUN: 10 mg/dL (ref 6–23)
CALCIUM: 8.4 mg/dL (ref 8.4–10.5)
CHLORIDE: 101 meq/L (ref 96–112)
CO2: 23 meq/L (ref 19–32)
Creatinine, Ser: 0.89 mg/dL (ref 0.50–1.10)
GFR calc Af Amer: 89 mL/min — ABNORMAL LOW (ref >90)
GFR calc non Af Amer: 77 mL/min — ABNORMAL LOW (ref >90)
GLUCOSE: 130 mg/dL — AB (ref 70–99)
POTASSIUM: 3.9 meq/L (ref 3.7–5.3)
Sodium: 137 mEq/L (ref 137–147)

## 2013-09-15 LAB — URINALYSIS, ROUTINE W REFLEX MICROSCOPIC
BILIRUBIN URINE: NEGATIVE
GLUCOSE, UA: NEGATIVE mg/dL
Hgb urine dipstick: NEGATIVE
Ketones, ur: >80 mg/dL — AB
Leukocytes, UA: NEGATIVE
Nitrite: NEGATIVE
PROTEIN: NEGATIVE mg/dL
Specific Gravity, Urine: 1.022 (ref 1.005–1.030)
Urobilinogen, UA: 1 mg/dL (ref 0.0–1.0)
pH: 6.5 (ref 5.0–8.0)

## 2013-09-15 LAB — GLUCOSE, CAPILLARY: Glucose-Capillary: 135 mg/dL — ABNORMAL HIGH (ref 70–99)

## 2013-09-15 LAB — PREGNANCY, URINE: Preg Test, Ur: NEGATIVE

## 2013-09-15 LAB — CBC
HEMATOCRIT: 30.9 % — AB (ref 36.0–46.0)
HEMOGLOBIN: 9.9 g/dL — AB (ref 12.0–15.0)
MCH: 26.2 pg (ref 26.0–34.0)
MCHC: 32 g/dL (ref 30.0–36.0)
MCV: 81.7 fL (ref 78.0–100.0)
Platelets: 328 10*3/uL (ref 150–400)
RBC: 3.78 MIL/uL — AB (ref 3.87–5.11)
RDW: 14.8 % (ref 11.5–15.5)
WBC: 7.7 10*3/uL (ref 4.0–10.5)

## 2013-09-15 LAB — TROPONIN I
Troponin I: <0.3 ng/mL (ref <0.30)
Troponin I: <0.3 ng/mL (ref <0.30)

## 2013-09-15 MED ORDER — PROMETHAZINE HCL 25 MG/ML IJ SOLN
12.5000 mg | Freq: Four times a day (QID) | INTRAMUSCULAR | Status: DC | PRN
  Administered 2013-09-15 – 2013-09-16 (×2): 12.5 mg via INTRAVENOUS
  Filled 2013-09-15 (×2): qty 1

## 2013-09-15 MED ORDER — PROMETHAZINE HCL 25 MG/ML IJ SOLN: 12.5000 mg | Freq: Four times a day (QID) | INTRAMUSCULAR | Status: DC | PRN

## 2013-09-15 NOTE — Progress Notes (Signed)
Patient c/o heaviness in the chest and non-radiating chest pain 9/10. EKG obtained with results NSR. Troponin drawn x 3 and normal. Morphine and xanax given at 0218 and call placed to on call NP and made aware.  Patient currently denies any chest pain/heaviness. Will continue to monitor.

## 2013-09-15 NOTE — Consult Note (Signed)
Subjective:   HPI  The patient is a 48 year old female who we are asked to see in regards to persistent vomiting over the past 5 days. The patient states that she has been having intermittent vomiting for the past year but 5 days ago it became much worse. She presented to the hospital where she was admitted after repeatedly coming to the emergency room for ongoing vomiting several times over the past few days. She recalls having a ulcer when she was 48 years old. An abdominal ultrasound was done which showed sludge in the gallbladder. She does have some upper abdominal discomfort. She takes omeprazole for reflux. She thinks she might have vomited a little blood some of the times that she vomited. Denies melena. Labs were reviewed and she is not pregnant. LFTs normal. Lipase normal. She is not uremic.  Review of Systems She has had some chest discomfort at times. CT scan was done to evaluate this nothing serious was found.  Past Medical History  Diagnosis Date  . Headache(784.0) 2011    Migraines  . Esophageal reflux     On omeprazole  . Costochondritis   . Hypertension     started around 2011  . Ovarian cyst    Past Surgical History  Procedure Laterality Date  . Oophorectomy      Around 1985 left ovary removal  . Tonsillectomy      Around 1994  . Tubal ligation     History   Social History  . Marital Status: Married    Spouse Name: N/A    Number of Children: 2  . Years of Education: N/A   Occupational History  .  [place]   Social History Main Topics  . Smoking status: Former Research scientist (life sciences)  . Smokeless tobacco: Never Used     Comment: Stopped 2004  . Alcohol Use: Yes     Comment: occ  . Drug Use: No  . Sexual Activity: Not on file   Other Topics Concern  . Not on file   Social History Narrative  . No narrative on file   family history includes Diabetes Mellitus II in her mother; Heart failure in her maternal grandmother; Hypertension in her brother, maternal  grandfather, mother, and sister. Current facility-administered medications:0.9 %  sodium chloride infusion, , Intravenous, Continuous, Jonetta Osgood, MD, Last Rate: 125 mL/hr at 09/15/13 1027;  acetaminophen (TYLENOL) suppository 650 mg, 650 mg, Rectal, Q6H PRN, Jonetta Osgood, MD;  acetaminophen (TYLENOL) tablet 650 mg, 650 mg, Oral, Q6H PRN, Jonetta Osgood, MD, 650 mg at 09/15/13 1024 albuterol (PROVENTIL) (2.5 MG/3ML) 0.083% nebulizer solution 2.5 mg, 2.5 mg, Nebulization, Q2H PRN, Jonetta Osgood, MD;  ALPRAZolam Duanne Moron) tablet 0.25 mg, 0.25 mg, Oral, TID PRN, Jonetta Osgood, MD, 0.25 mg at 09/15/13 0218;  carvedilol (COREG) tablet 6.25 mg, 6.25 mg, Oral, BID WC, Jonetta Osgood, MD, 6.25 mg at 09/15/13 0742;  enoxaparin (LOVENOX) injection 40 mg, 40 mg, Subcutaneous, QHS, Jonetta Osgood, MD, 40 mg at 09/15/13 0004 guaiFENesin-dextromethorphan (ROBITUSSIN DM) 100-10 MG/5ML syrup 5 mL, 5 mL, Oral, Q4H PRN, Jonetta Osgood, MD;  morphine 2 MG/ML injection 2 mg, 2 mg, Intravenous, Q4H PRN, Jonetta Osgood, MD, 2 mg at 09/15/13 1438;  ondansetron (ZOFRAN) injection 4 mg, 4 mg, Intravenous, 4 times per day, Jonetta Osgood, MD, 4 mg at 09/15/13 1247;  oxyCODONE (Oxy IR/ROXICODONE) immediate release tablet 5 mg, 5 mg, Oral, Q4H PRN, Jonetta Osgood, MD pantoprazole (PROTONIX) injection  40 mg, 40 mg, Intravenous, Q12H, Jonetta Osgood, MD, 40 mg at 09/15/13 1024;  promethazine (PHENERGAN) injection 12.5 mg, 12.5 mg, Intravenous, Q6H PRN, Belkys A Regalado, MD No Known Allergies   Objective:     BP 148/73  Pulse 71  Temp(Src) 98.5 F (36.9 C) (Oral)  Resp 20  Ht 5\' 5"  (1.651 m)  Wt 69.627 kg (153 lb 8 oz)  BMI 25.54 kg/m2  SpO2 93%  LMP 09/10/2013  She is nonicteric  Heart regular rhythm no murmurs  Lungs clear  Abdomen: Bowel sounds present, soft, there is some tenderness in the epigastrium and right upper quadrant but no rebound or guarding  Laboratory No  components found with this basename: d1      Assessment:     Vomiting etiology unclear. Rule out peptic ulcer disease rule out biliary dyskinesia rule out gastric motility disorder as possibilities.      Plan:     PPI therapy. We will plan EGD to look for evidence of peptic ulcer. If this is unrevealing we will plan HIDA scan with ejection fraction. Lab Results  Component Value Date   HGB 9.9* 09/15/2013   HGB 11.6* 09/14/2013   HGB 11.2* 09/13/2013   HCT 30.9* 09/15/2013   HCT 36.6 09/14/2013   HCT 34.9* 09/13/2013   ALKPHOS 70 09/14/2013   ALKPHOS 68 09/13/2013   ALKPHOS 63 09/12/2013   AST 18 09/14/2013   AST 18 09/13/2013   AST 15 09/12/2013   ALT 10 09/14/2013   ALT 10 09/13/2013   ALT 9 09/12/2013

## 2013-09-15 NOTE — Progress Notes (Signed)
[facility] PROGRESS NOTE  Paula [name] ZOX:[id number] DOB: 12/02/1965 DOA: 09/14/2013 PCP: Arnoldo Morale, MD  Assessment/Plan: Chest pain  - Clearly atypical, easily reproducible. Suspect musculoskeletal etiology from persistent vomiting and retching, vs esophagitis, PUD.  -Continue with protonix.  - Has had a recent echocardiogram, suspect does not need any further workup at this time. For now, follow clinical course.  -Anemia, follow trend.   . Intractable Vomiting  -IV fluids.  - CT scan of the abdomen is negative for acute abnormalities, she denies any marijuana use. Abdomen is very soft on exam.  -RUQ [place] pending, depending on results will consult GI for evaluation for endoscopy.   . Essential hypertension  - Continue Coreg   . Migraine headache  - headaches better.    Code Status: full code.  Family Communication: care discussed with Patient.  Disposition Plan: Remain inpatient.    Consultants:  none  Procedures:  RUQ [place];   Antibiotics:  none  HPI/Subjective: Relates chest pain on and off, nausea and vomiting.  Pain worse with food.   Objective: Filed Vitals:   09/15/13 0742  BP: 148/73  Pulse: 71  Temp:   Resp:     Intake/Output Summary (Last 24 hours) at 09/15/13 0853 Last data filed at 09/15/13 0600  Gross per 24 hour  Intake 1783.33 ml  Output    200 ml  Net 1583.33 ml   Filed Weights   09/14/13 2023 09/14/13 2342  Weight: 70.308 kg (155 lb) 69.627 kg (153 lb 8 oz)    Exam:   General:  No distress, sleepy.   Cardiovascular: S 1, S 2 RRR  Respiratory: CTA  Abdomen: epigastric tenderness, RUQ tenderness.   Musculoskeletal: no edema.   Data Reviewed: Basic Metabolic Panel:  Recent Labs Lab 09/11/13 1905 09/12/13 1600 09/13/13 1245 09/14/13 2103 09/15/13 0700  NA 140 141 140 137 137  K 3.8 3.9 3.8 3.6* 3.9  CL 99 101 99 96 101  CO2 28 27 24 25 23   GLUCOSE 113* 131* 140* 138* 130*  BUN 13 10 9 11 10    CREATININE 1.00 1.00 0.90 1.00 0.89  CALCIUM 9.9 9.1 9.8 9.4 8.4   Liver Function Tests:  Recent Labs Lab 09/11/13 1905 09/12/13 1600 09/13/13 1245 09/14/13 2103  AST 15 15 18 18   ALT 8 9 10 10   ALKPHOS 73 63 68 70  BILITOT 0.4 0.4 0.5 0.4  PROT 8.3 7.4 8.3 8.4*  ALBUMIN 4.4 4.0 4.5 4.5    Recent Labs Lab 09/11/13 1905 09/13/13 1245 09/14/13 2103  LIPASE 39 21 26   No results found for this basename: AMMONIA,  in the last 168 hours CBC:  Recent Labs Lab 09/11/13 1905 09/12/13 1525 09/13/13 1245 09/14/13 2103 09/15/13 0700  WBC 7.1 6.9 7.5 10.0 7.7  NEUTROABS  --  4.9 6.3  --   --   HGB 11.9* 11.1* 11.2* 11.6* 9.9*  HCT 37.1 34.7* 34.9* 36.6 30.9*  MCV 82.8 83.2 81.9 82.1 81.7  PLT 376 370 353 395 328   Cardiac Enzymes:  Recent Labs Lab 09/12/13 1600 09/13/13 1245 09/14/13 2103 09/15/13 0050 09/15/13 0700  TROPONINI <0.30 <0.30 <0.30 <0.30 <0.30   BNP (last 3 results) No results found for this basename: PROBNP,  in the last 8760 hours CBG: No results found for this basename: GLUCAP,  in the last 168 hours  Recent Results (from the past 240 hour(s))  URINE CULTURE     Status: None   Collection Time  09/12/13  4:45 PM      Result Value Ref Range Status   Specimen Description URINE, CLEAN CATCH   Final   Special Requests NONE   Final   Culture  Setup Time     Final   Value: 09/13/2013 00:33     Performed at [facility]     Final   Value: NO GROWTH     Performed at [facility]   Culture     Final   Value: NO GROWTH     Performed at [facility]   Report Status 09/13/2013 FINAL   Final     Studies: Ct Abdomen Pelvis Wo Contrast  09/13/2013   CLINICAL DATA:  Flank pain.  EXAM: CT ABDOMEN AND PELVIS WITHOUT CONTRAST  TECHNIQUE: Multidetector CT imaging of the abdomen and pelvis was performed following the standard protocol without IV contrast.  COMPARISON:  CT 09/11/2013.  FINDINGS: Liver normal.  Spleen normal. Pancreas normal. No biliary distention. Contrast is noted within the gallbladder, most likely from recent contrast study. No gallbladder distention. No pericholecystic fluid collection.  Adrenals are normal. Kidneys are normal. No hydronephrosis or obstructing ureteral stone. Bladder is nondistended. Uterus and adnexa unremarkable. Bilateral tubal ligations. No free pelvic fluid.  No evidence of adenopathy.  Abdominal aorta normal in caliber.  Appendix normal. No evidence of bowel obstruction. No free air. No mesenteric mass. No bowel herniation.  Mild infiltrate right lower lobe suggesting pneumonia. Mild cardiomegaly. No acute bony abnormality.  IMPRESSION: 1. Again noted is a mild infiltrate right lower lobe consistent pneumonia. 2. No acute intra abdominal abnormality identified.   Electronically Signed   By: Marcello Moores  Register   On: 09/13/2013 14:37   Ct Angio Chest Pe W/cm &/or Wo Cm  09/13/2013   CLINICAL DATA:  48 year old female with chest pain shortness of Breath. Initial encounter.  EXAM: CT ANGIOGRAPHY CHEST WITH CONTRAST  TECHNIQUE: Multidetector CT imaging of the chest was performed using the standard protocol during bolus administration of intravenous contrast. Multiplanar CT image reconstructions and MIPs were obtained to evaluate the vascular anatomy.  CONTRAST:  12mL OMNIPAQUE IOHEXOL 350 MG/ML SOLN  COMPARISON:  Chest radiographs 09/12/2013.  FINDINGS: Good contrast bolus timing in the pulmonary arterial tree.  No focal filling defect identified in the pulmonary arterial tree to suggest the presence of acute pulmonary embolism.  Notably, the right lower lobe pulmonary artery is diminutive compared to that on the left, and there is prominence of right phrenic arteries subjacent to the right hemidiaphragm. These may give collateral flow to the right lower lobe pleura.  Negative visualized aorta. Bovine arch configuration. No pericardial effusion. No pleural effusion. No mediastinal  or hilar lymphadenopathy. No axillary lymphadenopathy. Negative thoracic inlet.  Major airways are patent. There is chronic scarring in the right lower lobe, suspicious for sequelae of a process such as previous cavitary or necrotizing pneumonia. Elsewhere the right lung is clear. The left lung is clear. No pleural effusion.  Prominence of the right phrenic artery as described above. Otherwise negative visualized liver, spleen, adrenal glands, left renal upper pole, and gastric fundus.  No acute osseous abnormality identified.  Review of the MIP images confirms the above findings.  IMPRESSION: 1.  No evidence of acute pulmonary embolus. 2. No acute findings identified in the chest. 3. Chronic changes to the right lower lobe with scarring which may explain diminutive right lower lobe pulmonary artery on the basis of chronic hypoxic vaso  constriction. There are right phrenic artery collaterals, possibly to the pleura.   Electronically Signed   By: Lars Pinks M.D.   On: 09/13/2013 14:51    Scheduled Meds: . aspirin EC  325 mg Oral Daily  . carvedilol  6.25 mg Oral BID WC  . enoxaparin (LOVENOX) injection  40 mg Subcutaneous QHS  . ketorolac  30 mg Intravenous 3 times per day  . ondansetron (ZOFRAN) IV  4 mg Intravenous 4 times per day  . pantoprazole (PROTONIX) IV  40 mg Intravenous Q12H   Continuous Infusions: . sodium chloride 125 mL/hr at 09/14/13 2344    Principal Problem:   Chest pain Active Problems:   Essential hypertension   Migraine headache   Intractable vomiting    Time spent: 35 minutes.     Niel Hummer A  [facility] Pager [phone]. If 7PM-7AM, please contact night-coverage at [email or web address], password [facility] 09/15/2013, 8:53 AM  LOS: 1 day

## 2013-09-16 ENCOUNTER — Encounter (HOSPITAL_COMMUNITY): Payer: Self-pay

## 2013-09-16 ENCOUNTER — Encounter (HOSPITAL_COMMUNITY)
Admission: EM | Disposition: A | Discharge status: Home Health | Payer: Self-pay | Source: Home / Self Care | Attending: Internal Medicine

## 2013-09-16 DIAGNOSIS — K259 Gastric ulcer, unspecified as acute or chronic, without hemorrhage or perforation: Secondary | ICD-10-CM | POA: Diagnosis present

## 2013-09-16 HISTORY — PX: ESOPHAGOGASTRODUODENOSCOPY: SHX5428

## 2013-09-16 SURGERY — EGD (ESOPHAGOGASTRODUODENOSCOPY)
Anesthesia: Moderate Sedation

## 2013-09-16 MED ORDER — PROMETHAZINE HCL 25 MG/ML IJ SOLN
25.0000 mg | Freq: Four times a day (QID) | INTRAMUSCULAR | Status: DC | PRN
  Administered 2013-09-16: 12.5 mg via INTRAVENOUS
  Administered 2013-09-17 – 2013-10-01 (×17): 25 mg via INTRAVENOUS
  Filled 2013-09-16 (×18): qty 1

## 2013-09-16 MED ORDER — MIDAZOLAM HCL 10 MG/2ML IJ SOLN
INTRAMUSCULAR | Status: AC
  Filled 2013-09-16: qty 4

## 2013-09-16 MED ORDER — ONDANSETRON HCL 4 MG/2ML IJ SOLN: 4.0000 mg | Freq: Four times a day (QID) | INTRAMUSCULAR | Status: DC | PRN

## 2013-09-16 MED ORDER — ENSURE COMPLETE PO LIQD: 237.0000 mL | Freq: Two times a day (BID) | ORAL | Status: DC | PRN

## 2013-09-16 MED ORDER — ONDANSETRON HCL 4 MG/2ML IJ SOLN
INTRAMUSCULAR | Status: AC
  Filled 2013-09-16: qty 2

## 2013-09-16 MED ORDER — SODIUM CHLORIDE 0.9 % IV SOLN
INTRAVENOUS | Status: DC
  Administered 2013-09-16: 10:00:00 via INTRAVENOUS

## 2013-09-16 MED ORDER — FENTANYL CITRATE 0.05 MG/ML IJ SOLN
INTRAMUSCULAR | Status: DC | PRN
  Administered 2013-09-16 (×2): 25 ug via INTRAVENOUS

## 2013-09-16 MED ORDER — MORPHINE SULFATE 2 MG/ML IJ SOLN
2.0000 mg | Freq: Once | INTRAMUSCULAR | Status: AC
  Administered 2013-09-16: 2 mg via INTRAVENOUS
  Filled 2013-09-16: qty 1

## 2013-09-16 MED ORDER — FENTANYL CITRATE 0.05 MG/ML IJ SOLN
INTRAMUSCULAR | Status: AC
  Filled 2013-09-16: qty 4

## 2013-09-16 MED ORDER — METOPROLOL TARTRATE 1 MG/ML IV SOLN
5.0000 mg | Freq: Two times a day (BID) | INTRAVENOUS | Status: AC
  Administered 2013-09-16 – 2013-09-20 (×9): 5 mg via INTRAVENOUS
  Filled 2013-09-16 (×10): qty 5

## 2013-09-16 MED ORDER — DIPHENHYDRAMINE HCL 50 MG/ML IJ SOLN
INTRAMUSCULAR | Status: AC
Start: 2013-09-16 — End: 2013-09-16
  Filled 2013-09-16: qty 1

## 2013-09-16 MED ORDER — MIDAZOLAM HCL 10 MG/2ML IJ SOLN: INTRAMUSCULAR | Status: DC | PRN

## 2013-09-16 MED ORDER — ONDANSETRON HCL 4 MG/2ML IJ SOLN
INTRAMUSCULAR | Status: DC | PRN
Start: 2013-09-16 — End: 2013-09-16
  Administered 2013-09-16: 4 mg via INTRAVENOUS

## 2013-09-16 MED ORDER — BUTAMBEN-TETRACAINE-BENZOCAINE 2-2-14 % EX AERO
INHALATION_SPRAY | CUTANEOUS | Status: DC | PRN
  Administered 2013-09-16: 1 via TOPICAL

## 2013-09-16 MED ORDER — ONDANSETRON HCL 4 MG/2ML IJ SOLN
4.0000 mg | INTRAMUSCULAR | Status: DC | PRN
  Administered 2013-09-16 – 2013-09-19 (×6): 4 mg via INTRAVENOUS
  Filled 2013-09-16 (×5): qty 2

## 2013-09-16 NOTE — Progress Notes (Signed)
Patient continuing to have chest pain and nausea and vomiting despite medication. Schorr, NP notified. Additional morphine 2 mg ordered for patient and zofran 4 mg IV changed to Q4. While rounding on patient, saw that she had fallen asleep before additional medicine given. Will continue to monitor patient and medicate and needed per order.

## 2013-09-16 NOTE — Care Management Note (Addendum)
    Page 1 of 2   10/08/2013     12:46:01 PM CARE MANAGEMENT NOTE 10/08/2013  Patient:  Paula [name], Paula [name]   Account Number:  [id number]  Date Initiated:  09/16/2013  Documentation initiated by:  Dessa Phi  Subjective/Objective Assessment:   48 Y/O F ADMITTED W/VHEST PAIN,N/V.     Action/Plan:   FROM HOME.   Anticipated DC Date:  09/18/2013   Anticipated DC Plan:  [place]  CM consult      Choice offered to / List presented to:  C-1 Patient   DME arranged  [place] arranged  HH-1 RN  [place].   Status of service:  Completed, signed off Medicare Important Message given?   (If response is "NO", the following Medicare IM given date fields will be blank) Date Medicare IM given:   Medicare IM given by:   Date Additional Medicare IM given:   Additional Medicare IM given by:    Discharge Disposition:  [place]  Per UR Regulation:  Reviewed for med. necessity/level of care/duration of stay  If discussed at [place] of Stay Meetings, dates discussed:    Comments:  10/08/13 12:30 CM met with pt in room to verify address and contact number.  Pt states she will be going to [address] [place] [address] and the best number is [phone].  CM called [facility] rep Kristen to add for HHPT/OT/RN/aide/SW.  CM called [facility] DME rep, Lucretia with address change.  Jeanella Anton will make arrangements with pt for DME delivery.  Request made of discharging MD to please note probable cause of emesis as this pt is high risk of returning; pt has been counseled as to the mechanics of her eating and small amounts, more frequently was encouraged. HHSW and HHRN in place to provide support and resources for pt post discharge.  No other CM needs were communicated. Mariane Masters, BSN, [facility] [phone].   10/04/13 10:15 CM  met with pt in room who has been refusing PT/OT eval; CM explained the importance in having that eval to see what disciplines she will need to go home and discharge is planned for this weekend.  Pt agrees to work with PT/OT today; confirms she wants [facility] to render ordered services and requests [name] 3n1, HHPT/OT/aide.  CM requests orders from MD for DME and HH. CM notified RN pt willing to work with PT/OT eval.  Waiting for orders.  Mariane Masters, BSN, [facility] [phone].  09/23/13 MMcGibboney, RN, BSN Pt selected [facility] for [facility] and DME.  Referral given to in house rep.   09/16/13 KATHY MAHABIR RN,BSN NCM [phone] EGD TODAY.

## 2013-09-16 NOTE — Progress Notes (Signed)
[facility] PROGRESS NOTE  Paula [name] AVW:[id number] DOB: Sep 28, 1965 DOA: 09/14/2013 PCP: Arnoldo Morale, MD  Assessment/Plan:  Antral Ulcer: continue with PPI, PRN zofran.   Chest pain  - related to antral Ulcer.  -Continue with protonix.  - Has had a recent echocardiogram, suspect does not need any further workup at this time. For now, follow clinical course.  -Anemia, follow trend.   . Intractable Vomiting likely related to Antral Ulcers.  -IV fluids.  - CT scan of the abdomen is negative for acute abnormalities, she denies any marijuana use. Abdomen is very soft on exam.  -RUQ US gallbladder sludge.  -Endoscopy: 3 ulcers present in the antrum of the stomach.  -Continue with Protonix.   . Essential hypertension  - Continue Coreg   . Migraine headache  - headaches better.    Code Status: full code.  Family Communication: care discussed with Patient.  Disposition Plan: Remain inpatient.    Consultants:  GI  Procedures:  RUQ [place]; gallbladder sludge.   Antibiotics:  none  HPI/Subjective: Abdominal pain better. Complaining back pain since she has been vomiting.  No more vomiting.   Objective: Filed Vitals:   09/16/13 1313  BP: 120/64  Pulse: 66  Temp: 98.1 F (36.7 C)  Resp: 16    Intake/Output Summary (Last 24 hours) at 09/16/13 1509 Last data filed at 09/16/13 1430  Gross per 24 hour  Intake   2115 ml  Output   2175 ml  Net    -60 ml   Filed Weights   09/14/13 2023 09/14/13 2342  Weight: 70.308 kg (155 lb) 69.627 kg (153 lb 8 oz)    Exam:   General:  No distress, sleepy.   Cardiovascular: S 1, S 2 RRR  Respiratory: CTA  Abdomen: mild tenderness, no rigidity.   Musculoskeletal: no edema. Mid back  Tenderness on palpation.   Data Reviewed: Basic Metabolic Panel:  Recent Labs Lab 09/11/13 1905 09/12/13 1600 09/13/13 1245 09/14/13 2103 09/15/13 0700  NA 140 141 140 137 137  K 3.8 3.9 3.8 3.6* 3.9  CL 99 101  99 96 101  CO2 28 27 24 25 23   GLUCOSE 113* 131* 140* 138* 130*  BUN 13 10 9 11 10   CREATININE 1.00 1.00 0.90 1.00 0.89  CALCIUM 9.9 9.1 9.8 9.4 8.4   Liver Function Tests:  Recent Labs Lab 09/11/13 1905 09/12/13 1600 09/13/13 1245 09/14/13 2103  AST 15 15 18 18   ALT 8 9 10 10   ALKPHOS 73 63 68 70  BILITOT 0.4 0.4 0.5 0.4  PROT 8.3 7.4 8.3 8.4*  ALBUMIN 4.4 4.0 4.5 4.5    Recent Labs Lab 09/11/13 1905 09/13/13 1245 09/14/13 2103  LIPASE 39 21 26   No results found for this basename: AMMONIA,  in the last 168 hours CBC:  Recent Labs Lab 09/11/13 1905 09/12/13 1525 09/13/13 1245 09/14/13 2103 09/15/13 0700  WBC 7.1 6.9 7.5 10.0 7.7  NEUTROABS  --  4.9 6.3  --   --   HGB 11.9* 11.1* 11.2* 11.6* 9.9*  HCT 37.1 34.7* 34.9* 36.6 30.9*  MCV 82.8 83.2 81.9 82.1 81.7  PLT 376 370 353 395 328   Cardiac Enzymes:  Recent Labs Lab 09/13/13 1245 09/14/13 2103 09/15/13 0050 09/15/13 0700 09/15/13 1146  TROPONINI <0.30 <0.30 <0.30 <0.30 <0.30   BNP (last 3 results) No results found for this basename: PROBNP,  in the last 8760 hours CBG:  Recent Labs Lab 09/15/13 [place]  135*    Recent Results (from the past 240 hour(s))  URINE CULTURE     Status: None   Collection Time    09/12/13  4:45 PM      Result Value Ref Range Status   Specimen Description URINE, CLEAN CATCH   Final   Special Requests NONE   Final   Culture  Setup Time     Final   Value: 09/13/2013 00:33     Performed at [place]     Final   Value: NO GROWTH     Performed at [facility]   Culture     Final   Value: NO GROWTH     Performed at [facility]   Report Status 09/13/2013 FINAL   Final     Studies: US Abdomen Complete  09/15/2013   CLINICAL DATA:  Abdominal pain and vomiting  EXAM: ULTRASOUND ABDOMEN COMPLETE  COMPARISON:  September 13, 2013.  FINDINGS: Gallbladder:  No gallstones or wall thickening visualized. There is no  pericholecystic fluid. There is sludge in the gallbladder.  No sonographic Murphy sign noted.  Common bile duct:  Diameter: 2 mm. There is no intrahepatic, common hepatic, or common bile duct dilatation.  Liver:  No focal lesion identified. Within normal limits in parenchymal echogenicity.  IVC:  No abnormality visualized.  Pancreas:  No mass or inflammatory focus.  Spleen:  Size and appearance within normal limits.  Right Kidney:  Length: 8.9 cm. Echogenicity within normal limits. No mass or hydronephrosis visualized.  Left Kidney:  Length: 9.1 cm. Echogenicity within normal limits. No mass or hydronephrosis visualized.  Abdominal aorta:  No aneurysm visualized.  Other findings:  No demonstrable ascites.  IMPRESSION: There is sludge in the gallbladder but no gallstones. There is no gallbladder wall thickening or pericholecystic fluid. Kidneys are borderline small bilaterally but otherwise appear normal. Study otherwise unremarkable.   Electronically Signed   By: Lowella Grip M.D.   On: 09/15/2013 13:25    Scheduled Meds: . carvedilol  6.25 mg Oral BID WC  . enoxaparin (LOVENOX) injection  40 mg Subcutaneous QHS  . ondansetron (ZOFRAN) IV  4 mg Intravenous 4 times per day  . pantoprazole (PROTONIX) IV  40 mg Intravenous Q12H   Continuous Infusions: . sodium chloride 125 mL/hr at 09/16/13 6283    Principal Problem:   Chest pain Active Problems:   Essential hypertension   Migraine headache   Intractable vomiting    Time spent: 35 minutes.     Niel Hummer A  [facility] Pager [phone]. If 7PM-7AM, please contact night-coverage at [email or web address], password [facility] 09/16/2013, 3:09 PM  LOS: 2 days

## 2013-09-16 NOTE — Progress Notes (Signed)
INITIAL NUTRITION ASSESSMENT  DOCUMENTATION CODES Per approved criteria  -Not Applicable   INTERVENTION: -Diet advancement per MD -Recommend Ensure Complete PRN -Will continue to monitor  NUTRITION DIAGNOSIS: Inadequate oral intake related to nausea/vomiting as evidenced by PO <75%.   Goal: Pt to meet >/= 90% of their estimated nutrition needs    Monitor:  Diet order, GI profile, labs, weights, total protein/energy intake  Reason for Assessment: MST  48 y.o. female  Admitting Dx: Chest pain  ASSESSMENT: Paula [name] is a 48 y.o. female with a Past Medical History of hypertension, migraine headaches, gastroesophageal reflux disease who presents today with the above noted complaint. The patient, since this past Tuesday, patient has had numerous episodes of intractable vomiting. Vomitus is mostly bilious and clear, without any blood. This has been associated with diffuse abdominal pain, bilateral chest pain and intermittent headaches- Vomiting, abdominal pain, bilateral chest pain, headache-for the past 4-5 days  -Pt s/p EGD during time of RD assessment, noted feeling weak and lethargic. Confirmed poor PO intake and vomiting pta; however was unable to obtain further food/nutrition hx as pt requested to rest at this time -Lunch tray w/0% PO intake in pt's room -MD noted confirmed pt with five day period of vomiting and abd pain -EGD indicates pt with three antral ulcers -Possible weight loss per previous medical records of 3-7 lbs (2.5-4.5% body weight, non-severe for time frame) -Will monitor PO intake and recommend Ensure to be available at pt's request for additional nutrient replenishment. Vomiting and abd pain will likely improve with the healing of ulcers.  Height: Ht Readings from Last 1 Encounters:  09/14/13 5\' 5"  (1.651 m)    Weight: Wt Readings from Last 1 Encounters:  09/14/13 153 lb 8 oz (69.627 kg)    Ideal Body Weight: 125 lbs  % Ideal Body Weight:  122%  Wt Readings from Last 10 Encounters:  09/14/13 153 lb 8 oz (69.627 kg)  09/14/13 153 lb 8 oz (69.627 kg)  09/13/13 150 lb (68.04 kg)  09/11/13 150 lb (68.04 kg)  08/15/13 157 lb 12.8 oz (71.578 kg)  08/08/13 158 lb (71.668 kg)  07/25/13 170 lb (77.111 kg)  07/23/13 175 lb (79.379 kg)  02/15/13 168 lb (76.204 kg)  05/31/12 159 lb 11.2 oz (72.439 kg)    Usual Body Weight: approximately 155-160 lbs per previous medical records  % Usual Body Weight: 97%  BMI:  Body mass index is 25.54 kg/(m^2).  Estimated Nutritional Needs: Kcal: 1856-3149  Protein: 70-85 gram Fluid: >/= 1800 ml/daily  Skin: WDL  Diet Order: Full Liquid  EDUCATION NEEDS: -Education not appropriate at this time   Intake/Output Summary (Last 24 hours) at 09/16/13 1519 Last data filed at 09/16/13 1430  Gross per 24 hour  Intake   2115 ml  Output   2175 ml  Net    -60 ml    Last BM: 7/25   Labs:   Recent Labs Lab 09/13/13 1245 09/14/13 2103 09/15/13 0700  NA 140 137 137  K 3.8 3.6* 3.9  CL 99 96 101  CO2 24 25 23   BUN 9 11 10   CREATININE 0.90 1.00 0.89  CALCIUM 9.8 9.4 8.4  GLUCOSE 140* 138* 130*    CBG (last 3)   Recent Labs  09/15/13 0937  GLUCAP 135*    Scheduled Meds: . carvedilol  6.25 mg Oral BID WC  . enoxaparin (LOVENOX) injection  40 mg Subcutaneous QHS  . ondansetron (ZOFRAN) IV  4 mg Intravenous  4 times per day  . pantoprazole (PROTONIX) IV  40 mg Intravenous Q12H    Continuous Infusions: . sodium chloride 125 mL/hr at 09/16/13 9935    Past Medical History  Diagnosis Date  . Headache(784.0) 2011    Migraines  . Esophageal reflux     On omeprazole  . Costochondritis   . Hypertension     started around 2011  . Ovarian cyst     Past Surgical History  Procedure Laterality Date  . Oophorectomy      Around 1985 left ovary removal  . Tonsillectomy      Around 1994  . Tubal ligation      [place] Clinical  Dietitian [phone]

## 2013-09-16 NOTE — Op Note (Signed)
[facility] [place], [address]   ENDOSCOPY PROCEDURE REPORT  PATIENT: Paula [name], Paula [name]  MR#: [id number] BIRTHDATE: May 24, 1965 , 47  yrs. old GENDER: Female ENDOSCOPIST: Acquanetta Sit, MD REFERRED BY: PROCEDURE DATE:  09/16/2013 PROCEDURE:   EGD with biopsy ASA CLASS: 2 INDICATIONS: vomiting MEDICATIONS: fentanyl 75 mcg IV, Versed 8 mg IV, Zofran 4 mg IV for nausea TOPICAL ANESTHETIC: Cetacaine spray  DESCRIPTION OF PROCEDURE:   After the risks benefits and alternatives of the procedure were thoroughly explained, informed consent was obtained.  The Pentax Gastroscope [id number]  endoscope was introduced through the mouth and advanced to the second portion of the duodenum      , limited by Without limitations.   The instrument was slowly withdrawn as the mucosa was fully examined.      FINDINGS:  Esophagus: Normal  Stomach: 3 ulcers present in the antrum of the stomach see image 003. Biopsies obtained. Ulcer is ranged in size from 5-7 mm. No visible vessel or active bleeding.  Duodenum: Normal  COMPLICATIONS:none  ENDOSCOPIC IMPRESSION:antral ulcers   RECOMMENDATIONS: PPI therapy. Anti-emetics when necessary      _______________________________ Carrie Mew, MD 09/16/2013 11:46 AM

## 2013-09-16 NOTE — Progress Notes (Signed)
Patient having increased nausea and vomiting this afternoon after being placed on full liquid diet. Patient received zofran and phenergan with no relief. Patient also started experiencing chest pain and some tachycardia while vomiting. Dr. Tyrell Antonio made aware. EKG done, additional phenergan given per order, and morphine given to patient. Patient made NPO.

## 2013-09-17 ENCOUNTER — Encounter (HOSPITAL_COMMUNITY): Payer: Self-pay | Admitting: Gastroenterology

## 2013-09-17 DIAGNOSIS — K253 Acute gastric ulcer without hemorrhage or perforation: Secondary | ICD-10-CM

## 2013-09-17 LAB — CBC
HCT: 37.1 % (ref 36.0–46.0)
Hemoglobin: 11.8 g/dL — ABNORMAL LOW (ref 12.0–15.0)
MCH: 26 pg (ref 26.0–34.0)
MCHC: 31.8 g/dL (ref 30.0–36.0)
MCV: 81.7 fL (ref 78.0–100.0)
Platelets: 239 10*3/uL (ref 150–400)
RBC: 4.54 MIL/uL (ref 3.87–5.11)
RDW: 14.6 % (ref 11.5–15.5)
WBC: 8.3 10*3/uL (ref 4.0–10.5)

## 2013-09-17 LAB — BASIC METABOLIC PANEL
Anion gap: 18 — ABNORMAL HIGH (ref 5–15)
BUN: 7 mg/dL (ref 6–23)
CALCIUM: 8.9 mg/dL (ref 8.4–10.5)
CO2: 22 meq/L (ref 19–32)
CREATININE: 0.7 mg/dL (ref 0.50–1.10)
Chloride: 95 mEq/L — ABNORMAL LOW (ref 96–112)
GFR calc Af Amer: >90 mL/min (ref >90)
Glucose, Bld: 100 mg/dL — ABNORMAL HIGH (ref 70–99)
Potassium: 3.4 mEq/L — ABNORMAL LOW (ref 3.7–5.3)
SODIUM: 135 meq/L — AB (ref 137–147)

## 2013-09-17 MED ORDER — POTASSIUM CHLORIDE 10 MEQ/100ML IV SOLN
10.0000 meq | INTRAVENOUS | Status: AC
  Administered 2013-09-17 (×3): 10 meq via INTRAVENOUS
  Filled 2013-09-17 (×3): qty 100

## 2013-09-17 NOTE — Progress Notes (Signed)
[facility] PROGRESS NOTE  Paula [name] YSA:[id number] DOB: [birth year] DOA: 09/14/2013 PCP: Arnoldo Morale, MD  Assessment/Plan:  1-Antral Ulcer: continue with PPI, PRN Zofran, phenergan. 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 Start clear diet.   2-Chest pain  - related to antral Ulcer.  -Continue with protonix.  - Has had a recent echocardiogram.  -Anemia, follow trend.  -Ct angio 7-24 no evidence of PE.   3- Intractable Vomiting likely related to Antral Ulcers.  -IV fluids.  - CT scan of the abdomen is negative for acute abnormalities, she denies any marijuana use. Abdomen is very soft on exam.  -RUQ US gallbladder sludge.  -Endoscopy: 3 ulcers present in the antrum of the stomach.  -Continue with Protonix.   4-Essential hypertension  - coreg change to IV metoprolol.   5-Migraine headache  - headaches better.   6-Back pain: could be MSK form vomiting.     Code Status: full code.  Family Communication: care discussed with Patient.  Disposition Plan: Remain inpatient.    Consultants:  GI  Procedures:  RUQ [place]; gallbladder sludge.   Antibiotics:  none  HPI/Subjective: Was vomiting last night. Still with mild abdominal pain chest pain. Worry about pathology result.   Objective: Filed Vitals:   09/17/13 1453  BP: 154/76  Pulse: 87  Temp: 98.2 F (36.8 C)  Resp: 15    Intake/Output Summary (Last 24 hours) at 09/17/13 1647 Last data filed at 09/17/13 1300  Gross per 24 hour  Intake 414.17 ml  Output   1250 ml  Net -835.83 ml   Filed Weights   09/14/13 2023 09/14/13 2342  Weight: 70.308 kg (155 lb) 69.627 kg (153 lb 8 oz)    Exam:   General:  No distress, alert.   Cardiovascular: S 1, S 2 RRR  Respiratory: CTA  Abdomen: mild tenderness, no rigidity.   Musculoskeletal: no edema. Mid back  Tenderness on palpation.   Data Reviewed: Basic Metabolic Panel:  Recent Labs Lab 09/12/13 1600 09/13/13 1245  09/14/13 2103 09/15/13 0700 09/17/13 0512  NA 141 140 137 137 135*  K 3.9 3.8 3.6* 3.9 3.4*  CL 101 99 96 101 95*  CO2 27 24 25 23 22   GLUCOSE 131* 140* 138* 130* 100*  BUN 10 9 11 10 7   CREATININE 1.00 0.90 1.00 0.89 0.70  CALCIUM 9.1 9.8 9.4 8.4 8.9   Liver Function Tests:  Recent Labs Lab 09/11/13 1905 09/12/13 1600 09/13/13 1245 09/14/13 2103  AST 15 15 18 18   ALT 8 9 10 10   ALKPHOS 73 63 68 70  BILITOT 0.4 0.4 0.5 0.4  PROT 8.3 7.4 8.3 8.4*  ALBUMIN 4.4 4.0 4.5 4.5    Recent Labs Lab 09/11/13 1905 09/13/13 1245 09/14/13 2103  LIPASE 39 21 26   No results found for this basename: AMMONIA,  in the last 168 hours CBC:  Recent Labs Lab 09/11/13 1905 09/12/13 1525 09/13/13 1245 09/14/13 2103 09/15/13 0700 09/17/13 0512  WBC 7.1 6.9 7.5 10.0 7.7 8.3  NEUTROABS  --  4.9 6.3  --   --   --   HGB 11.9* 11.1* 11.2* 11.6* 9.9* 11.8*  HCT 37.1 34.7* 34.9* 36.6 30.9* 37.1  MCV 82.8 83.2 81.9 82.1 81.7 81.7  PLT 376 370 353 395 328 239   Cardiac Enzymes:  Recent Labs Lab 09/13/13 1245 09/14/13 2103 09/15/13 0050 09/15/13 0700 09/15/13 1146  TROPONINI <0.30 <0.30 <0.30 <0.30 <0.30   BNP (last 3 results)  No results found for this basename: PROBNP,  in the last 8760 hours CBG:  Recent Labs Lab 09/15/13 0937  GLUCAP 135*    Recent Results (from the past 240 hour(s))  URINE CULTURE     Status: None   Collection Time    09/12/13  4:45 PM      Result Value Ref Range Status   Specimen Description URINE, CLEAN CATCH   Final   Special Requests NONE   Final   Culture  Setup Time     Final   Value: 09/13/2013 00:33     Performed at [facility]     Final   Value: NO GROWTH     Performed at [facility]   Culture     Final   Value: NO GROWTH     Performed at [facility]   Report Status 09/13/2013 FINAL   Final     Studies: No results found.  Scheduled Meds: . metoprolol  5 mg Intravenous Q12H  .  pantoprazole (PROTONIX) IV  40 mg Intravenous Q12H   Continuous Infusions: . sodium chloride 125 mL/hr at 09/17/13 1829    Principal Problem:   Chest pain Active Problems:   Essential hypertension   Migraine headache   Intractable vomiting   Antral ulcer    Time spent: 35 minutes.     Niel Hummer A  [facility] Pager [phone]. If 7PM-7AM, please contact night-coverage at [email or web address], password [email or web address] 09/17/2013, 4:47 PM  LOS: 3 days

## 2013-09-17 NOTE — Progress Notes (Signed)
[facility] Progress Note  Subjective: She is still intermittently having nausea and vomiting. Dr. Truman Hayward from pathology called me and told me that the biopsies from the gastric ulcers show signet cells and she is concerned that this is malignancy. She is doing some additional stains and will have the final report tomorrow. I informed the patient about this.  Objective: Vital signs in last 24 hours: Temp:  [98.2 F (36.8 C)-98.6 F (37 C)] 98.2 F (36.8 C) (07/28 0519) Pulse Rate:  [77-89] 89 (07/28 0519) Resp:  [16] 16 (07/28 0519) BP: (162-175)/(74-95) 175/95 mmHg (07/28 0519) SpO2:  [100 %] 100 % (07/28 0519) Weight change:    PE:  She is in no distress  Abdomen is soft  Lab Results: Results for orders placed during the hospital encounter of 09/14/13 (from the past 24 hour(s))  CBC     Status: Abnormal   Collection Time    09/17/13  5:12 AM      Result Value Ref Range   WBC 8.3  4.0 - 10.5 K/uL   RBC 4.54  3.87 - 5.11 MIL/uL   Hemoglobin 11.8 (*) 12.0 - 15.0 g/dL   HCT 37.1  36.0 - 46.0 %   MCV 81.7  78.0 - 100.0 fL   MCH 26.0  26.0 - 34.0 pg   MCHC 31.8  30.0 - 36.0 g/dL   RDW 14.6  11.5 - 15.5 %   Platelets 239  150 - 400 K/uL  BASIC METABOLIC PANEL     Status: Abnormal   Collection Time    09/17/13  5:12 AM      Result Value Ref Range   Sodium 135 (*) 137 - 147 mEq/L   Potassium 3.4 (*) 3.7 - 5.3 mEq/L   Chloride 95 (*) 96 - 112 mEq/L   CO2 22  19 - 32 mEq/L   Glucose, Bld 100 (*) 70 - 99 mg/dL   BUN 7  6 - 23 mg/dL   Creatinine, Ser 0.70  0.50 - 1.10 mg/dL   Calcium 8.9  8.4 - 10.5 mg/dL   GFR calc non Af Amer >90  >90 mL/min   GFR calc Af Amer >90  >90 mL/min   Anion gap 18 (*) 5 - 15    Studies/Results: No results found.    Assessment: Antral ulcers. Awaiting pathology for confirmation of malignancy  Plan: Continue PPI therapy and supportive care with anti-emetics for nausea and vomiting. Await pathology report.    Wonda Horner 09/17/2013, 1:52 PM

## 2013-09-18 DIAGNOSIS — C169 Malignant neoplasm of stomach, unspecified: Secondary | ICD-10-CM

## 2013-09-18 DIAGNOSIS — K259 Gastric ulcer, unspecified as acute or chronic, without hemorrhage or perforation: Secondary | ICD-10-CM

## 2013-09-18 DIAGNOSIS — C163 Malignant neoplasm of pyloric antrum: Principal | ICD-10-CM

## 2013-09-18 DIAGNOSIS — J189 Pneumonia, unspecified organism: Secondary | ICD-10-CM

## 2013-09-18 DIAGNOSIS — R1110 Vomiting, unspecified: Secondary | ICD-10-CM

## 2013-09-18 LAB — BASIC METABOLIC PANEL
Anion gap: 18 — ABNORMAL HIGH (ref 5–15)
BUN: 8 mg/dL (ref 6–23)
CO2: 23 mEq/L (ref 19–32)
CREATININE: 0.74 mg/dL (ref 0.50–1.10)
Calcium: 9.1 mg/dL (ref 8.4–10.5)
Chloride: 92 mEq/L — ABNORMAL LOW (ref 96–112)
GFR calc Af Amer: >90 mL/min (ref >90)
GFR calc non Af Amer: >90 mL/min (ref >90)
GLUCOSE: 91 mg/dL (ref 70–99)
POTASSIUM: 3.6 meq/L — AB (ref 3.7–5.3)
Sodium: 133 mEq/L — ABNORMAL LOW (ref 137–147)

## 2013-09-18 LAB — LACTATE DEHYDROGENASE: LDH: 172 U/L (ref 94–250)

## 2013-09-18 MED ORDER — HYDRALAZINE HCL 20 MG/ML IJ SOLN
10.0000 mg | Freq: Three times a day (TID) | INTRAMUSCULAR | Status: DC | PRN
  Administered 2013-09-18: 10 mg via INTRAVENOUS
  Filled 2013-09-18: qty 1

## 2013-09-18 MED ORDER — METOCLOPRAMIDE HCL 5 MG/ML IJ SOLN
5.0000 mg | Freq: Three times a day (TID) | INTRAMUSCULAR | Status: DC | PRN
  Administered 2013-09-27 – 2013-09-30 (×8): 5 mg via INTRAVENOUS
  Filled 2013-09-18 (×8): qty 2

## 2013-09-18 MED ORDER — METOPROLOL TARTRATE 1 MG/ML IV SOLN
5.0000 mg | Freq: Four times a day (QID) | INTRAVENOUS | Status: DC | PRN
  Filled 2013-09-18: qty 5

## 2013-09-18 MED ORDER — ENOXAPARIN SODIUM 40 MG/0.4ML ~~LOC~~ SOLN
40.0000 mg | SUBCUTANEOUS | Status: DC
  Administered 2013-09-18: 40 mg via SUBCUTANEOUS
  Filled 2013-09-18 (×2): qty 0.4

## 2013-09-18 NOTE — Consult Note (Signed)
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  Will plan diagnostic laparoscopy and distal gastrectomy tentatively for Friday.  She may need a feeding tube.    Will further discuss with patient tomorrow.

## 2013-09-18 NOTE — Progress Notes (Signed)
Pt HR sustaining 150s. MD notified. Will continue to monitor.

## 2013-09-18 NOTE — Consult Note (Signed)
Reason for Consult: gastric adenocarcinoma  Referring Physician: Dr. Niel Hummer   HPI: Paula [name] is a 48 year old female with a history of GERD, remove gastric ulcers who was admitted on 09/13/13 with ongoing nausea, vomiting and epigastric abdominal pain.  Duration of symptoms is 1 year.  She reports she has been seen by PCP and ED several times and diagnosed with costochondritis.  Her symptoms were intermittent, but now persistent.   She denies any weight loss in recent months.  She has been able to tolerate oral intake and has had a good appetite with respect to the intermittent episodes she would have.  She denies melena or hematochezia.  Location of pain is in the epigastric region and the chest without radiation. No modifying factors.  Aggravated by palpation.  No alleviating factors.  She reports her entire abdomen is sore now due to ongoing vomiting.  She denies tobacco use.  She reports drinking 1 bottle of wine in the month of may and notes increased stressors at home.  Her family history includes an uncle who died in his 84-60s with gastric cancer.    A CTA of chest was negative for PE.  Abdominal US negative for cholecystitis.  She had a EGD by Dr. Penelope Coop which revealed ulcers which were biopsied.  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  We have therefore been asked to evaluate the patient for surgery.     Past Medical History  Diagnosis Date  . Headache(784.0) 2011    Migraines  . Esophageal reflux     On omeprazole  . Costochondritis   . Hypertension     started around 2011  . Ovarian cyst     Past Surgical History  Procedure Laterality Date  . Oophorectomy      Around 1985 left ovary removal  . Tonsillectomy      Around 1994  . Tubal  ligation    . Esophagogastroduodenoscopy N/A 09/16/2013    Procedure: ESOPHAGOGASTRODUODENOSCOPY (EGD);  Surgeon: Wonda Horner, MD;  Location: [facility];  Service: Endoscopy;  Laterality: N/A;    Family History  Problem Relation Age of Onset  . Hypertension Mother   . Diabetes Mellitus II Mother   . Hypertension Sister   . Hypertension Brother   . Heart failure Maternal Grandmother   . Hypertension Maternal Grandfather     Social History:  reports that she has quit smoking. She has never used smokeless tobacco. She reports that she drinks alcohol. She reports that she does not use illicit drugs.  Allergies: No Known Allergies  Medications:  Scheduled Meds: . enoxaparin (LOVENOX) injection  40 mg Subcutaneous Q24H  . metoprolol  5 mg Intravenous Q12H  . pantoprazole (PROTONIX) IV  40 mg Intravenous Q12H   Continuous Infusions: . sodium chloride 125 mL/hr at 09/18/13 0605   PRN Meds:.acetaminophen, acetaminophen, albuterol, ALPRAZolam, feeding supplement (ENSURE COMPLETE), guaiFENesin-dextromethorphan, hydrALAZINE, metoCLOPramide (REGLAN) injection, morphine injection, ondansetron (ZOFRAN) IV, oxyCODONE, promethazine   Results for orders placed during the hospital encounter of 09/14/13 (from the past 48 hour(s))  CBC     Status: Abnormal   Collection Time    09/17/13  5:12 AM      Result Value Ref Range   WBC 8.3  4.0 - 10.5 K/uL   Comment: REPEATED  TO VERIFY   RBC 4.54  3.87 - 5.11 MIL/uL   Hemoglobin 11.8 (*) 12.0 - 15.0 g/dL   HCT 37.1  36.0 - 46.0 %   MCV 81.7  78.0 - 100.0 fL   MCH 26.0  26.0 - 34.0 pg   MCHC 31.8  30.0 - 36.0 g/dL   RDW 14.6  11.5 - 15.5 %   Platelets 239  150 - 400 K/uL   Comment: DELTA CHECK NOTED     SPECIMEN CHECKED FOR CLOTS     REPEATED TO VERIFY     PLATELET COUNT CONFIRMED BY SMEAR  BASIC METABOLIC PANEL     Status: Abnormal   Collection Time    09/17/13  5:12 AM      Result Value Ref Range   Sodium 135 (*) 137 - 147 mEq/L    Potassium 3.4 (*) 3.7 - 5.3 mEq/L   Chloride 95 (*) 96 - 112 mEq/L   CO2 22  19 - 32 mEq/L   Glucose, Bld 100 (*) 70 - 99 mg/dL   BUN 7  6 - 23 mg/dL   Creatinine, Ser 0.70  0.50 - 1.10 mg/dL   Calcium 8.9  8.4 - 10.5 mg/dL   GFR calc non Af Amer >90  >90 mL/min   GFR calc Af Amer >90  >90 mL/min   Comment: (NOTE)     The eGFR has been calculated using the CKD EPI equation.     This calculation has not been validated in all clinical situations.     eGFR's persistently <90 mL/min signify possible Chronic Kidney     Disease.   Anion gap 18 (*) 5 - 15  LACTATE DEHYDROGENASE     Status: None   Collection Time    09/18/13  2:30 PM      Result Value Ref Range   LDH 172  94 - 250 U/L    No results found.  Review of Systems  All other systems reviewed and are negative.  Blood pressure 174/98, pulse 101, temperature 98.2 F (36.8 C), temperature source Oral, resp. rate 14, height 5' 5"  (1.651 m), weight 156 lb 1.4 oz (70.8 kg), last menstrual period 09/10/2013, SpO2 100.00%. Physical Exam  Constitutional: She appears well-developed and well-nourished. No distress.  HENT:  Head: Normocephalic and atraumatic.  Neck: Normal range of motion. Neck supple.  Cardiovascular: Normal rate, regular rhythm, normal heart sounds and intact distal pulses.  Exam reveals no gallop and no friction rub.   No murmur heard. Respiratory: Effort normal and breath sounds normal.  GI: Soft. Bowel sounds are normal. She exhibits no distension and no mass. There is no rebound and no guarding.  Mild diffuse tenderness without peritoneal signs  Lymphadenopathy:    She has no cervical adenopathy.  Skin: Skin is warm and dry. No rash noted. She is not diaphoretic. No erythema. No pallor.  Psychiatric: She has a normal mood and affect. Her behavior is normal. Judgment and thought content normal.    Assessment/Plan: Intractable vomiting  Gastric adenocarcinoma  we will clarify with oncology whether the patient  needs to have the genetic testing done.  If this is desired, then we would wait for the results before we proceed with surgery because if genetic testing is positive then the surgical recommendations change to a total gastrectomy.  Given her nausea and vomiting Im not sure the patient would be able to holding off on surgery.  Will check her pre-albumin.  CEA is pending.  We may need to initiate TPN if she is malnourished.  We discussed this in depth with the patient.  She verbalizes understanding.  Her son wishes to speak with [place] tomorrow and we will try to coordinate a time tomorrow.  Thank you for the consult.  Will follow.     Esti Demello ANP-BC 09/18/2013, 3:38 PM

## 2013-09-18 NOTE — Progress Notes (Addendum)
[facility] PROGRESS NOTE  Paula [name] FVC:[id number] DOB: 25-Oct-1965 DOA: 09/14/2013 PCP: Arnoldo Morale, MD  Assessment/Plan:  1-Poorly differentiated Adenocarcinoma Gastric. Antral Ulcer: continue with PPI, PRN Zofran, phenergan. 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 Oncology and surgery consulted.  Check HIV in am.  CEA pending.   2-Chest pain:  - related to antral Ulcer.  -Continue with protonix.  - Has had a recent echocardiogram.  -Anemia, follow trend.  -Ct angio 7-24 no evidence of PE.   3- Intractable Vomiting likely related to Antral Ulcers.  -IV fluids.  - CT scan of the abdomen is negative for acute abnormalities, she denies any marijuana use. Abdomen is very soft on exam.  -RUQ US gallbladder sludge.  -Endoscopy: 3 ulcers present in the antrum of the stomach.  -Continue with Protonix.  -Continue with phenergan and Zofran. Will order Reglan PRN.   4-Essential hypertension  - coreg change to IV metoprolol.   5-Migraine headache  - headaches better.  -Will get MRI brain due to recent diagnosis of gastric cancer and headaches to rule out metastasis diseases.   6-Back pain: could be MSK form vomiting. If persist might need further evaluation, imagine.   7-DVT prophylaxis; start Lovenox in setting of malignancy. Monitor for bleeding due to antral ulcer.   8-Hypokalemia; B-met pending.   Code Status: full code.  Family Communication: care discussed with Patient.  Disposition Plan: Remain inpatient.    Consultants:  GI  Oncology  Surgery.   Procedures:  RUQ [place]; gallbladder sludge.   Antibiotics:  none  HPI/Subjective: Still vomiting, last episode at 5 am. I inform her results of biopsy.  family at bedside. Support and plan of care was explained to the patient and family.   Objective: Filed Vitals:   09/18/13 0518  BP: 152/91  Pulse: 108  Temp: 98.8 F (37.1 C)  Resp: 18     Intake/Output Summary (Last 24 hours) at 09/18/13 1139 Last data filed at 09/18/13 0900  Gross per 24 hour  Intake    510 ml  Output   1350 ml  Net   -840 ml   Filed Weights   09/14/13 2023 09/14/13 2342 09/18/13 0518  Weight: 70.308 kg (155 lb) 69.627 kg (153 lb 8 oz) 70.8 kg (156 lb 1.4 oz)    Exam:   General:  Crying, anxious. Mild distress.   Cardiovascular: S 1, S 2 RRR  Respiratory: CTA  Abdomen: mild tenderness, no rigidity.   Musculoskeletal: no edema. Mid back  Tenderness on palpation.   Data Reviewed: Basic Metabolic Panel:  Recent Labs Lab 09/12/13 1600 09/13/13 1245 09/14/13 2103 09/15/13 0700 09/17/13 0512  NA 141 140 137 137 135*  K 3.9 3.8 3.6* 3.9 3.4*  CL 101 99 96 101 95*  CO2 _0 GLUCOSE 131* 140* 138* 130* 100*  BUN _1 CREATININE 1.00 0.90 1.00 0.89 0.70  CALCIUM 9.1 9.8 9.4 8.4 8.9   Liver Function Tests:  Recent Labs Lab 09/11/13 1905 09/12/13 1600 09/13/13 1245 09/14/13 2103  AST _2 ALT _3 ALKPHOS 73 63 68 70  BILITOT 0.4 0.4 0.5 0.4  PROT 8.3 7.4 8.3 8.4*  ALBUMIN 4.4 4.0 4.5 4.5    Recent Labs Lab 09/11/13 1905 09/13/13 1245 09/14/13 2103  LIPASE 39 21 26   No results found for this basename: AMMONIA,  in the last 168 hours CBC:  Recent Labs Lab 09/11/13 1905 09/12/13 1525 09/13/13 1245 09/14/13 2103 09/15/13 0700 09/17/13 0512  WBC 7.1 6.9 7.5 10.0 7.7 8.3  NEUTROABS  --  4.9 6.3  --   --   --   HGB 11.9* 11.1* 11.2* 11.6* 9.9* 11.8*  HCT 37.1 34.7* 34.9* 36.6 30.9* 37.1  MCV 82.8 83.2 81.9 82.1 81.7 81.7  PLT 376 370 353 395 328 239   Cardiac Enzymes:  Recent Labs Lab 09/13/13 1245 09/14/13 2103 09/15/13 0050 09/15/13 0700 09/15/13 1146  TROPONINI <0.30 <0.30 <0.30 <0.30 <0.30   BNP (last 3 results) No results found for this basename: PROBNP,  in the last 8760 hours CBG:  Recent Labs Lab 09/15/13 0937  GLUCAP 135*    Recent Results (from  the past 240 hour(s))  URINE CULTURE     Status: None   Collection Time    09/12/13  4:45 PM      Result Value Ref Range Status   Specimen Description URINE, CLEAN CATCH   Final   Special Requests NONE   Final   Culture  Setup Time     Final   Value: 09/13/2013 00:33     Performed at [facility]   Colony Count     Final   Value: NO GROWTH     Performed at [facility]   Culture     Final   Value: NO GROWTH     Performed at [facility]   Report Status 09/13/2013 FINAL   Final     Studies: No results found.  Scheduled Meds: . metoprolol  5 mg Intravenous Q12H  . pantoprazole (PROTONIX) IV  40 mg Intravenous Q12H   Continuous Infusions: . sodium chloride 125 mL/hr at 09/18/13 0605    Principal Problem:   Chest pain Active Problems:   Essential hypertension   Migraine headache   Intractable vomiting   Antral ulcer    Time spent: 30 minutes.     ,  A  [facility] Pager [phone]. If 7PM-7AM, please contact night-coverage at [email or web address], password [email or web address] 09/18/2013, 11:39 AM  LOS: 4 days             

## 2013-09-18 NOTE — Consult Note (Signed)
[name]  [phone]   HEMATOLOGY ONCOLOGY CONSULTATION   Paula [name]  DOB: Aug 19, 1965  MR#: [id number]  CSN#: [id number]    Requesting Physician: [facility]  Primary MD: Arnoldo Morale, MD  Reason for Consult: Gastric Cancer  History of present illness:   [name] is a pleasant 48 year old woman with a history of GERD and remote gastric ulcers, admitted on 7/24 with progressive nausea, abdominal pain, bilateral chest wall pain, and migraine headaches, complicated with non-bloody, bilious intractable vomiting on the day of presentation. These symptoms were initially present over a period of one year, but they were intermittent and self resolving. Last event prior to this admission was in May of 2015. She has been seen at the ED 4 times consecutively without clear explanation except for possible costochondritis, since 7/18. As symptoms were persistent, she was admitted for further workup.   A CT angio performed prior to admission on 7/24 was negative for pulmonary emboli, but possible R LL pneumonia. A CT of the abdomen and pelvis with contrast on 7/25 was negative for acute abnormalities as well. Abdominal ultrasound was done which showed sludge in the gallbladder. As symptoms were not resolved, GI evaluation was obtained on 7/26 (Dr. Penelope Coop) EGD was performed on 7/27 to evaluate for ulcers. 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   Positive family history of gastric cancer in paternal uncle. Denies risk factors for HIV or hepatitis. Has been consuming ETOH over the last 2 weeks, at 1 bottle of wine a day due to stress. About 3 lb weight loss since June 2015. Decreased appetite/  early satiation due to recent symptoms. Denies increased abdominal girth. Denies blood in the stools. No hematemesis.   We were kindly requested to evaluate  the patient with recommendations.    Past medical history:      Past Medical History  Diagnosis Date  . Headache(784.0) 2011    Migraines  . Esophageal reflux     On omeprazole  . Costochondritis   . Hypertension     started around 2011  . Ovarian cyst     Past surgical history:      Past Surgical History  Procedure Laterality Date  . Oophorectomy      Around 1985 left ovary removal  . Tonsillectomy      Around 1994  . Tubal ligation    . Esophagogastroduodenoscopy N/A 09/16/2013    Procedure: ESOPHAGOGASTRODUODENOSCOPY (EGD);  Surgeon: Wonda Horner, MD;  Location: [facility];  Service: Endoscopy;  Laterality: N/A;    Medications:  Prior to Admission:  Prescriptions prior to admission  Medication Sig Dispense Refill  . aspirin 81 MG tablet Take 81 mg by mouth daily.      . bisacodyl (DULCOLAX) 5 MG EC tablet Take 5 mg by mouth daily as needed for moderate constipation.      . carvedilol (COREG) 6.25 MG tablet Take 6.25 mg by mouth 2 (two) times daily with a meal.      . FLUoxetine (PROZAC) 20 MG capsule Take 1 capsule (20 mg total) by mouth daily.  30 capsule  3  . omeprazole (PRILOSEC) 20 MG capsule Take 1 capsule (20 mg total) by mouth 2 (two) times daily.  [place]  capsule  0  . ondansetron (ZOFRAN ODT) 4 MG disintegrating tablet Take 1 tablet (4 mg total) by mouth every 8 (eight) hours as needed for nausea or vomiting.  20 tablet  0  . oxyCODONE-acetaminophen (PERCOCET) 5-325 MG per tablet Take 1 tablet by mouth every 6 (six) hours as needed for moderate pain.  15 tablet  0  . promethazine (PHENERGAN) 25 MG suppository Place 1 suppository (25 mg total) rectally every 6 (six) hours as needed for nausea or vomiting.  12 each  0  . azithromycin (ZITHROMAX) 250 MG tablet Take 1 tablet (250 mg total) by mouth daily. Take  first 2 tablets together, then 1 every day until finished.  6 tablet  0    JJH:ERDEYCXKGYJEH, acetaminophen, albuterol, ALPRAZolam, feeding supplement (ENSURE COMPLETE), guaiFENesin-dextromethorphan, morphine injection, ondansetron (ZOFRAN) IV, oxyCODONE, promethazine  Allergies: No Known Allergies  Family history:  Remarkable for one uncle with stomach cancer, another with "some form of cancer" and one aunt with breast cancer.    Family History  Problem Relation Age of Onset  . Hypertension Mother   . Diabetes Mellitus II Mother   . Hypertension Sister   . Hypertension Brother   . Heart failure Maternal Grandmother   . Hypertension Maternal Grandfather     Social History:  reports that she has quit smoking. She has never used smokeless tobacco. She reports that she drinks a bottle a day of wine for the last 2 weeks, prior to that, she drank about 1-2 beers daily. She reports that she does not use illicit drugs. Patient has some psychosocial issues involving her marriage, husband imprisoned (please refer to progress notes from 6/22, primary MD).Patient has 2 children in good health.      ROS: Constitutional: Denies fevers, chills or abnormal night sweats Eyes: Denies blurriness of vision, double vision or watery eyes Ears, nose, mouth, throat, and face: Denies mucositis or sore throat Respiratory: Denies cough, dyspnea or wheezes Cardiovascular: Denies palpitation, cardiac chest discomfort or lower extremity swelling Gastrointestinal:  See HPI Skin: Denies abnormal skin rashes Lymphatics: Denies new lymphadenopathy or easy bruising Neurological:Denies numbness, tingling or new weaknesses Musculoskeletal: recent costochondritis manifested as chest wall pain Behavioral/Psych: Mood is stable, no new changes. Patient has a history of depression All other systems were reviewed with the patient and are negative.   Physical Exam    ECOG PERFORMANCE STATUS: 1  Filed Vitals:    09/18/13 0518  BP: 152/91  Pulse: 108  Temp: 98.8 F (37.1 C)  Resp: 18   Filed Weights   09/14/13 2023 09/14/13 2342 09/18/13 0518  Weight: 155 lb (70.308 kg) 153 lb 8 oz (69.627 kg) 156 lb 1.4 oz (70.8 kg)    GENERAL:alert,no distress and comfortable SKIN: skin color, texture, turgor are normal, no rashes or significant lesions EYES: normal, conjunctiva are pink and non-injected, sclera clear OROPHARYNX:no exudate, no erythema and lips, buccal mucosa, and tongue normal  NECK: supple, thyroid normal size, non-tender, without nodularity LYMPH:  no palpable lymphadenopathy in the cervical, axillary or inguinal LUNGS: clear to auscultation and percussion with normal breathing effort HEART: regular rate & rhythm ,1/6 systolic murmur and no lower extremity edema ABDOMEN:abdomen soft, mildly tender and normal bowel sounds Musculoskeletal:no cyanosis of digits and no clubbing. Mild chest wall and back tenderness to palpation PSYCH: alert & oriented x 3 with fluent speech NEURO: no focal motor/sensory deficits   Lab results:       CBC  Recent Labs Lab 09/11/13 1905 09/12/13  1525 09/13/13 1245 09/14/13 2103 09/15/13 0700 09/17/13 0512  WBC 7.1 6.9 7.5 10.0 7.7 8.3  HGB 11.9* 11.1* 11.2* 11.6* 9.9* 11.8*  HCT 37.1 34.7* 34.9* 36.6 30.9* 37.1  PLT 376 370 353 395 328 239  MCV 82.8 83.2 81.9 82.1 81.7 81.7  MCH 26.6 26.6 26.3 26.0 26.2 26.0  MCHC 32.1 32.0 32.1 31.7 32.0 31.8  RDW 15.0 15.0 14.7 14.8 14.8 14.6  LYMPHSABS  --  1.5 1.0  --   --   --   MONOABS  --  0.4 0.3  --   --   --   EOSABS  --  0.0 0.0  --   --   --   BASOSABS  --  0.0 0.0  --   --   --       Chemistries   Recent Labs Lab 09/12/13 1600 09/13/13 1245 09/14/13 2103 09/15/13 0700 09/17/13 0512  NA 141 140 137 137 135*  K 3.9 3.8 3.6* 3.9 3.4*  CL 101 99 96 101 95*  CO2 27 24 25 23 22   GLUCOSE 131* 140* 138* 130* 100*  BUN 10 9 11 10 7   CREATININE 1.00 0.90 1.00 0.89 0.70  CALCIUM 9.1 9.8 9.4  8.4 8.9     Studies:      Ct Abdomen Pelvis Wo Contrast  09/13/2013    COMPARISON:  CT 09/11/2013.  FINDINGS: Liver normal. Spleen normal. Pancreas normal. No biliary distention. Contrast is noted within the gallbladder, most likely from recent contrast study. No gallbladder distention. No pericholecystic fluid collection.  Adrenals are normal. Kidneys are normal. No hydronephrosis or obstructing ureteral stone. Bladder is nondistended. Uterus and adnexa unremarkable. Bilateral tubal ligations. No free pelvic fluid.  No evidence of adenopathy.  Abdominal aorta normal in caliber.  Appendix normal. No evidence of bowel obstruction. No free air. No mesenteric mass. No bowel herniation.  Mild infiltrate right lower lobe suggesting pneumonia. Mild cardiomegaly. No acute bony abnormality.  IMPRESSION: 1. Again noted is a mild infiltrate right lower lobe consistent pneumonia. 2. No acute intra abdominal abnormality identified.   Electronically Signed   By: Marcello Moores  Register   On: 09/13/2013 14:37   Dg Chest 2 View  09/12/2013    Comparison: none remain within normal limits.  The lungs are normally inflated. No airspace consolidation, pleural effusion, or pulmonary edema is identified. There is no pneumothorax.  No acute osseous abnormality identified.  IMPRESSION: No active cardiopulmonary disease.   Electronically Signed   By: Jeannine Boga M.D.   On: 09/12/2013 15:51   Ct Angio Chest Pe W/cm &/or Wo Cm  09/13/2013      COMPARISON:  Chest radiographs 09/12/2013.  FINDINGS: Good contrast bolus timing in the pulmonary arterial tree.  No focal filling defect identified in the pulmonary arterial tree to suggest the presence of acute pulmonary embolism.  Notably, the right lower lobe pulmonary artery is diminutive compared to that on the left, and there is prominence of right phrenic arteries subjacent to the right hemidiaphragm. These may give collateral flow to the right lower lobe pleura.  Negative  visualized aorta. Bovine arch configuration. No pericardial effusion. No pleural effusion. No mediastinal or hilar lymphadenopathy. No axillary lymphadenopathy. Negative thoracic inlet.  Major airways are patent. There is chronic scarring in the right lower lobe, suspicious for sequelae of a process such as previous cavitary or necrotizing pneumonia. Elsewhere the right lung is clear. The left lung is clear. No pleural effusion.  Prominence of  the right phrenic artery as described above. Otherwise negative visualized liver, spleen, adrenal glands, left renal upper pole, and gastric fundus.  No acute osseous abnormality identified.  Review of the MIP images confirms the above findings.  IMPRESSION: 1.  No evidence of acute pulmonary embolus. 2. No acute findings identified in the chest. 3. Chronic changes to the right lower lobe with scarring which may explain diminutive right lower lobe pulmonary artery on the basis of chronic hypoxic vaso constriction. There are right phrenic artery collaterals, possibly to the pleura.   Electronically Signed   By: Lars Pinks M.D.   On: 09/13/2013 14:51   US Abdomen Complete  09/15/2013     COMPARISON:  September 13, 2013.  FINDINGS: Gallbladder:  No gallstones or wall thickening visualized. There is no pericholecystic fluid. There is sludge in the gallbladder.  No sonographic Murphy sign noted.  Common bile duct:  Diameter: 2 mm. There is no intrahepatic, common hepatic, or common bile duct dilatation.  Liver:  No focal lesion identified. Within normal limits in parenchymal echogenicity.  IVC:  No abnormality visualized.  Pancreas:  No mass or inflammatory focus.  Spleen:  Size and appearance within normal limits.  Right Kidney:  Length: 8.9 cm. Echogenicity within normal limits. No mass or hydronephrosis visualized.  Left Kidney:  Length: 9.1 cm. Echogenicity within normal limits. No mass or hydronephrosis visualized.  Abdominal aorta:  No aneurysm visualized.  Other findings:  No  demonstrable ascites.  IMPRESSION: There is sludge in the gallbladder but no gallstones. There is no gallbladder wall thickening or pericholecystic fluid. Kidneys are borderline small bilaterally but otherwise appear normal. Study otherwise unremarkable.   Electronically Signed   By: Lowella Grip M.D.   On: 09/15/2013 13:25   Ct Abdomen Pelvis W Contrast  09/11/2013    COMPARISON:  None.  FINDINGS: Bones: No aggressive osseous lesions. Lumbosacral transitional vertebra.  Lung Bases: Scarring is present in the RIGHT lower lobe extending to the lateral pleural surface. This extends to the RIGHT middle lobe as well. No pulmonary mass lesion.  Liver:  Normal.  Spleen:  Normal.  Gallbladder:  Normal.  No calcified gallstones.  Common bile duct:  Normal.  Pancreas:  Normal.  Adrenal glands:  Normal bilaterally.  Kidneys: Normal enhancement. There is early excretion of contrast from both kidneys. Tiny subcentimeter RIGHT interpolar low-density renal lesion likely represents a cyst. Both ureters appear within normal limits. Normal delayed excretion of contrast.  Stomach:  Normal.  Small bowel: Normal. No mesenteric adenopathy. No bowel obstruction.  Colon: Metallic density is present throughout the colon, compatible with ingested bismuth salts. No colonic obstruction or inflammatory changes.  Pelvic Genitourinary: Uterus appears within normal limits. Ovaries normal. Bladder collapsed.  Vasculature: Normal.  Body Wall: Normal.  IMPRESSION: No acute abnormality. RIGHT middle and RIGHT lower lobe pulmonary parenchymal scarring.   Electronically Signed   By: Dereck Ligas M.D.   On: 09/11/2013 20:53    Diagnosis Stomach, biopsy, antral ulcer [id number] - POORLY DIFFERENTIATED ADENOCARCINOMA WITH SIGNET RING CELL FEATURES (DIFFUSE TYPE). Microscopic Comment Immunostains were performed and the tumor cells are strongly positive for CK7, CK20 and CDX 2, negative for ER with appropriate control. Her 2 neu study will  be performed and an addendum report will follow.  Aldona Bar MD Pathologist, Electronic Signature (Case signed 09/18/2013) Specimen Gross and Clinical Information   Assessmnent/Plan:47 y.o. African American female with   1. Gastric Adenocarcinoma She was admitted with intractable nausea and  vomiting.  Patient has a remote history of H. Pylori (age 58) CT of the abdomen and pelvis were essentially unremarkable. 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 Her 2 neu study performed, with results pending. If HER positive, targeted therapy may be considered. Patient may carry familial malignancy (one uncle with gastric cancer) Genetic testing will be needed. Patient has 2 children who may benefit from testing as well Check CEA, LDH.  Consider PET scan to rule out occult disease (bone, liver) Surgical evaluation to be considered if local disease proven, along with chemotherapy post op. She may need Oncology follow up at the [facility] for [place].  2. Chest pain In the setting of pneumonia and antral ulcer On Protonix 2D echo unremarkable  3. Intractable vomiting Secondary to #1 and gallbladder sludge seen in ultrasound On Protonix and antiemetics, IVF Better controlled. Consider antisecretory meds as salivary secretions are increasing in the setting of gastric malignancy.   4. Full Code  Other medical issues as per admitting team.   Rondel Jumbo, PA-C 09/18/2013  ADDENDUM: Hematology/Oncology Attending: The patient is seen and examined. I agree with the above note. This is a very pleasant 48 years old [place] American female with no significant past medical history except for hypertension and GERD. She also has a history of  alcohol abuse. For the last few months the patient has been complaining of progressive nausea as well as abdominal pain. This was intermittent initially but was getting worse in the last few weeks. She was seen at the emergency department several times previously. She was admitted on 09/13/2013 with shortness of breath as well as nausea and vomiting and abdominal pain. CT angiogram of the chest performed on 09/13/2013 was negative for pulmonary embolism but there was questionable right lower lobe pneumonia. She also had CT scan and ultrasound of the abdomen and pelvis that showed no significant abnormality except for slight and the gallbladder area. The patient was seen by Dr. Penelope Coop, gastroenterology on 09/15/2013 and on 09/16/2013 she underwent upper endoscopy which showed gastric ulcers present in the antrum of the stomach ranging in size from 5-7 mm with no visible vessels or active bleeding. 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 Immunostains were performed and the tumor cells are strongly positive for CK7, CK20 and CDX 2, negative for ER with appropriate control. Her 2 neu study will be performed.  I was asked to see the patient today for evaluation and to give recommendation regarding treatment of her condition. When seen today she continues to complain of increasing fatigue and weakness as well as abdominal pain. She has a family history of malignancy in several members including an uncle and a niece with stomach cancer.  Assessment and plan: This is a very pleasant 48 years old [place] American female recently diagnosed with a stage IA (T1, N0, M0) gastric poorly differentiated adenocarcinoma.  The recent CT scan of the chest, abdomen and pelvis showed no evidence for metastatic disease. I had a lengthy discussion with the patient today about her current disease status and  treatment options. I strongly recommended for the patient to get a surgical evaluation for resection. She will be seen later today by Dr. Barry Dienes for  consideration of distal gastric resection and laparoscopic evaluation to rule out any metastatic disease. If the final stage of her disease is consistent with a stage IA after surgical resection, the patient would continue on routine surveillance and close monitoring. There would be no role for adjuvant chemotherapy unless she has a higher disease stage. The patient has family members with gastric cancer and I'm not sure if her disease is hereditary but genetic counseling to rule out [facility] 1 gene mutation may be helpful to evaluate the risk of her children to develop gastric cancer as people with mutation have 80% chance of developing gastric cancer in their lifetime. I will arrange for the patient a followup appointment with me at the [facility] after her surgical resection for close monitoring of her disease. Thank you so much for allowing me to participate in the care of [name]. Please call if you have any questions.

## 2013-09-18 NOTE — Progress Notes (Signed)
Patient is aware of diagnosis of adenocarcinoma of the stomach. Oncology and surgical consult placed. Still with nausea and vomiting. Continue antiemetics. Continue PPI.

## 2013-09-19 ENCOUNTER — Ambulatory Visit: Payer: Self-pay | Admitting: Family Medicine

## 2013-09-19 ENCOUNTER — Inpatient Hospital Stay (HOSPITAL_COMMUNITY): Payer: No Typology Code available for payment source

## 2013-09-19 DIAGNOSIS — E440 Moderate protein-calorie malnutrition: Secondary | ICD-10-CM | POA: Diagnosis present

## 2013-09-19 DIAGNOSIS — C169 Malignant neoplasm of stomach, unspecified: Secondary | ICD-10-CM

## 2013-09-19 DIAGNOSIS — E46 Unspecified protein-calorie malnutrition: Secondary | ICD-10-CM

## 2013-09-19 DIAGNOSIS — I609 Nontraumatic subarachnoid hemorrhage, unspecified: Secondary | ICD-10-CM

## 2013-09-19 DIAGNOSIS — R079 Chest pain, unspecified: Secondary | ICD-10-CM

## 2013-09-19 LAB — BASIC METABOLIC PANEL
Anion gap: 16 — ABNORMAL HIGH (ref 5–15)
BUN: 8 mg/dL (ref 6–23)
CO2: 23 meq/L (ref 19–32)
Calcium: 8.5 mg/dL (ref 8.4–10.5)
Chloride: 94 mEq/L — ABNORMAL LOW (ref 96–112)
Creatinine, Ser: 0.65 mg/dL (ref 0.50–1.10)
GFR calc Af Amer: >90 mL/min (ref >90)
GFR calc non Af Amer: >90 mL/min (ref >90)
GLUCOSE: 86 mg/dL (ref 70–99)
POTASSIUM: 2.8 meq/L — AB (ref 3.7–5.3)
SODIUM: 133 meq/L — AB (ref 137–147)

## 2013-09-19 LAB — CBC
HEMATOCRIT: 36.1 % (ref 36.0–46.0)
HEMOGLOBIN: 11.8 g/dL — AB (ref 12.0–15.0)
MCH: 26.1 pg (ref 26.0–34.0)
MCHC: 32.7 g/dL (ref 30.0–36.0)
MCV: 79.9 fL (ref 78.0–100.0)
Platelets: 367 10*3/uL (ref 150–400)
RBC: 4.52 MIL/uL (ref 3.87–5.11)
RDW: 14.4 % (ref 11.5–15.5)
WBC: 8.2 10*3/uL (ref 4.0–10.5)

## 2013-09-19 LAB — HIV ANTIBODY (ROUTINE TESTING W REFLEX): HIV: NONREACTIVE

## 2013-09-19 LAB — CEA: CEA: 0.6 ng/mL (ref 0.0–5.0)

## 2013-09-19 LAB — PREALBUMIN: Prealbumin: 17.3 mg/dL — ABNORMAL LOW (ref 17.0–34.0)

## 2013-09-19 MED ORDER — SODIUM CHLORIDE 0.9 % IJ SOLN
10.0000 mL | INTRAMUSCULAR | Status: DC | PRN
  Administered 2013-09-19 – 2013-09-29 (×8): 10 mL
  Administered 2013-09-30: 30 mL
  Administered 2013-10-02: 10 mL

## 2013-09-19 MED ORDER — GADOBENATE DIMEGLUMINE 529 MG/ML IV SOLN
14.0000 mL | Freq: Once | INTRAVENOUS | Status: AC | PRN
  Administered 2013-09-19: 14 mL via INTRAVENOUS

## 2013-09-19 MED ORDER — TRACE MINERALS CR-CU-F-FE-I-MN-MO-SE-ZN IV SOLN
INTRAVENOUS | Status: AC
  Administered 2013-09-19: 20:00:00 via INTRAVENOUS
  Filled 2013-09-19: qty 1000

## 2013-09-19 MED ORDER — FAT EMULSION 20 % IV EMUL
250.0000 mL | INTRAVENOUS | Status: AC
  Administered 2013-09-19: 250 mL via INTRAVENOUS
  Filled 2013-09-19: qty 250

## 2013-09-19 MED ORDER — DEXTROSE 5 % IV SOLN
2.0000 g | INTRAVENOUS | Status: AC
  Administered 2013-09-20: 2 g via INTRAVENOUS
  Filled 2013-09-19: qty 2

## 2013-09-19 MED ORDER — HYDROMORPHONE HCL PF 1 MG/ML IJ SOLN
0.5000 mg | INTRAMUSCULAR | Status: DC | PRN
  Administered 2013-09-19 – 2013-09-21 (×4): 1 mg via INTRAVENOUS
  Filled 2013-09-19 (×5): qty 1

## 2013-09-19 MED ORDER — INSULIN ASPART 100 UNIT/ML ~~LOC~~ SOLN
0.0000 [IU] | Freq: Four times a day (QID) | SUBCUTANEOUS | Status: DC
  Administered 2013-09-20: 2 [IU] via SUBCUTANEOUS
  Administered 2013-09-20 – 2013-09-23 (×8): 1 [IU] via SUBCUTANEOUS

## 2013-09-19 MED ORDER — POTASSIUM CHLORIDE 10 MEQ/100ML IV SOLN
10.0000 meq | INTRAVENOUS | Status: AC
  Administered 2013-09-19 (×4): 10 meq via INTRAVENOUS
  Filled 2013-09-19 (×4): qty 100

## 2013-09-19 NOTE — Progress Notes (Signed)
Had discussion about surgery with patient and son about surgery.  Used diagrams about anatomy.
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
## 2013-09-19 NOTE — Progress Notes (Signed)
CRITICAL VALUE ALERT  Critical value received:  Potassium 2.8  Date of notification:  09/19/13  Time of notification:  0513  Critical value read back:Yes.    Nurse who received alert:  Sherryl Manges RN  MD notified (1st page):  K.Schorr NP  Time of first page:  0544  MD notified (2nd page):  Time of second page:  Responding MD:    Time MD responded:

## 2013-09-19 NOTE — Progress Notes (Signed)
PARENTERAL NUTRITION CONSULT NOTE - INITIAL  Pharmacy Consult for TNA Indication: massive bowel resection  No Known Allergies  Patient Measurements: Height: 5\' 5"  (165.1 cm) Weight: 156 lb 1.4 oz (70.8 kg) IBW/kg (Calculated) : 57 Adjusted Body Weight: 62 3-7 lb weight loss per RD notes  Vital Signs: Temp: 98.1 F (36.7 C) (07/30 0433) Temp src: Oral (07/30 0433) BP: 157/72 mmHg (07/30 0433) Pulse Rate: 87 (07/30 0433) Intake/Output from previous day: 07/29 0701 - 07/30 0700 In: 1774.6 [P.O.:360; I.V.:1414.6] Out: -  Intake/Output from this shift:    Labs:  Recent Labs  09/17/13 0512 09/19/13 0416  WBC 8.3 8.2  HGB 11.8* 11.8*  HCT 37.1 36.1  PLT 239 367     Recent Labs  09/17/13 0512 09/18/13 1430 09/19/13 0416  NA 135* 133* 133*  K 3.4* 3.6* 2.8*  CL 95* 92* 94*  CO2 22 23 23   GLUCOSE 100* 91 86  BUN 7 8 8   CREATININE 0.70 0.74 0.65  CALCIUM 8.9 9.1 8.5  PREALBUMIN  --  17.3*  --    Estimated Creatinine Clearance: 86.7 ml/min (by C-G formula based on Cr of 0.65).   No results found for this basename: GLUCAP,  in the last 72 hours  Medical History: Past Medical History  Diagnosis Date  . Headache(784.0) 2011    Migraines  . Esophageal reflux     On omeprazole  . Costochondritis   . Hypertension     started around 2011  . Ovarian cyst     Medications:  Scheduled:  . [START ON 09/20/2013] cefOXitin  2 g Intravenous On Call to OR  . enoxaparin (LOVENOX) injection  40 mg Subcutaneous Q24H  . metoprolol  5 mg Intravenous Q12H  . pantoprazole (PROTONIX) IV  40 mg Intravenous Q12H   Infusions:  . sodium chloride 125 mL/hr at 09/19/13 5631    Insulin Requirements in the past 24 hours:  n/a  Current Nutrition:  NPO starting midnight 7/31 in prep for surgery  Assessment: 48 yo female with newly diagnosed poorly differentiated gastric adenocarcinoma with plan per [facility] of massive bowel resection scheduled for 7/31. To place PICC line and go  ahead and start TNA per pharmacy dosing this evening if possible.  Lytes: Na low but appears stable, K 2.8 - replacement IV orders already ordered this AM Renal: stable, CrCl 87 ml/min Hepatic: pending CBGs: blood glucoses stable TGs: to draw in am Prealb: to draw in am IV access: to place PICC 7/30? TNA Day #: 1   Nutritional Goals:   Per RD assessment, 1750-1950 kCal,  70-85grams of protein per day  Clinimix 5/15 at goal rate of 70 ml/hr + lipid to provide 84g protein and 1672 kcal/day  Plan: after PICC line placed and at 1800 1) Start Clinimix E 5/15 at rate of 40 ml/hr and Lipids at rate of 10 ml/hr 2) Start q6h CBG/SSI after TNA started 3) Mon/Thurs TNA labs 4) Hold off on reducing IVFs since patient getting surgery tomorrow - follow up tomorrow   Adrian Saran, PharmD, BCPS Pager [phone] 09/19/2013 12:11 PM
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
## 2013-09-19 NOTE — Progress Notes (Signed)
Peripherally Inserted Central Catheter/Midline Placement  The IV Nurse has discussed with the patient and/or persons authorized to consent for the patient, the purpose of this procedure and the potential benefits and risks involved with this procedure.  The benefits include less needle sticks, lab draws from the catheter and patient may be discharged home with the catheter.  Risks include, but not limited to, infection, bleeding, blood clot (thrombus formation), and puncture of an artery; nerve damage and irregular heat beat.  Alternatives to this procedure were also discussed.  PICC/Midline Placement Documentation        Darlyn Read 09/19/2013, 6:27 PM
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
## 2013-09-19 NOTE — Progress Notes (Signed)
Patient ID: Paula [name], female   DOB: Jun 16, 1965, 48 y.o.   MRN: [id number]  Subjective: Spitting up, but taking in clears.  States her family DOES NOT have a history of gastric cancer instead it was prostate and lung cancer.   Objective:  Vital signs:  Filed Vitals:   09/18/13 1804 09/18/13 2159 09/19/13 0129 09/19/13 0433  BP: 166/97 172/76 157/75 157/72  Pulse: 98 95 84 87  Temp:  98.5 F (36.9 C)  98.1 F (36.7 C)  TempSrc:  Oral  Oral  Resp:    20  Height:      Weight:      SpO2:  95%  100%    Last BM Date: 09/14/13  Intake/Output   Yesterday:  07/29 0701 - 07/30 0700 In: 1774.6 [P.O.:360; I.V.:1414.6] Out: -  This shift:    I/O last 3 completed shifts: In: 1914.6 [P.O.:500; I.V.:1414.6] Out: 450 [Urine:450]    Physical Exam: General: Pt awake/alert/oriented x4 in no acute distress Chest: cta.  No chest wall pain w good excursion CV:  Pulses intact.  Regular rhythm MS: Normal AROM mjr joints.  No obvious deformity Abdomen: Soft.  Nondistended. ttp to epigastric region.   No evidence of peritonitis.  No incarcerated hernias. Ext:  SCDs BLE.  No mjr edema.  No cyanosis Skin: No petechiae / purpura   Problem List:   Principal Problem:   Chest pain Active Problems:   Essential hypertension   Migraine headache   Intractable vomiting   Antral ulcer   Gastric carcinoma    Results:   Labs: Results for orders placed during the hospital encounter of 09/14/13 (from the past 48 hour(s))  CEA     Status: None   Collection Time    09/18/13  2:30 PM      Result Value Ref Range   CEA 0.6  0.0 - 5.0 ng/mL   Comment: Performed at [place] DEHYDROGENASE     Status: None   Collection Time    09/18/13  2:30 PM      Result Value Ref Range   LDH 172  94 - 250 U/L  BASIC METABOLIC PANEL     Status: Abnormal   Collection Time    09/18/13  2:30 PM      Result Value Ref Range   Sodium 133 (*) 137 - 147 mEq/L   Potassium 3.6 (*)  3.7 - 5.3 mEq/L   Chloride 92 (*) 96 - 112 mEq/L   CO2 23  19 - 32 mEq/L   Glucose, Bld 91  70 - 99 mg/dL   BUN 8  6 - 23 mg/dL   Creatinine, Ser 0.74  0.50 - 1.10 mg/dL   Calcium 9.1  8.4 - 10.5 mg/dL   GFR calc non Af Amer >90  >90 mL/min   GFR calc Af Amer >90  >90 mL/min   Comment: (NOTE)     The eGFR has been calculated using the CKD EPI equation.     This calculation has not been validated in all clinical situations.     eGFR's persistently <90 mL/min signify possible Chronic Kidney     Disease.   Anion gap 18 (*) 5 - 15  PREALBUMIN     Status: Abnormal   Collection Time    09/18/13  2:30 PM      Result Value Ref Range   Prealbumin 17.3 (*) 17.0 - 34.0 mg/dL   Comment: Performed at [facility]  BASIC METABOLIC PANEL     Status: Abnormal   Collection Time    09/19/13  4:16 AM      Result Value Ref Range   Sodium 133 (*) 137 - 147 mEq/L   Potassium 2.8 (*) 3.7 - 5.3 mEq/L   Comment: CRITICAL RESULT CALLED TO, READ BACK BY AND VERIFIED WITH:     SPOKE WITH MBURU,J RN [phone] [id number] COV[place],N     DELTA CHECK NOTED     REPEATED TO VERIFY   Chloride 94 (*) 96 - 112 mEq/L   CO2 23  19 - 32 mEq/L   Glucose, Bld 86  70 - 99 mg/dL   BUN 8  6 - 23 mg/dL   Creatinine, Ser 0.65  0.50 - 1.10 mg/dL   Calcium 8.5  8.4 - 10.5 mg/dL   GFR calc non Af Amer >90  >90 mL/min   GFR calc Af Amer >90  >90 mL/min   Comment: (NOTE)     The eGFR has been calculated using the CKD EPI equation.     This calculation has not been validated in all clinical situations.     eGFR's persistently <90 mL/min signify possible Chronic Kidney     Disease.   Anion gap 16 (*) 5 - 15  CBC     Status: Abnormal   Collection Time    09/19/13  4:16 AM      Result Value Ref Range   WBC 8.2  4.0 - 10.5 K/uL   RBC 4.52  3.87 - 5.11 MIL/uL   Hemoglobin 11.8 (*) 12.0 - 15.0 g/dL   HCT 36.1  36.0 - 46.0 %   MCV 79.9  78.0 - 100.0 fL   MCH 26.1  26.0 - 34.0 pg   MCHC 32.7  30.0 - 36.0 g/dL   RDW 14.4   11.5 - 15.5 %   Platelets 367  150 - 400 K/uL   Comment: REPEATED TO VERIFY     DELTA CHECK NOTED    Imaging / Studies: No results found.  Scheduled Meds: . [START ON 09/20/2013] cefOXitin  2 g Intravenous On Call to OR  . enoxaparin (LOVENOX) injection  40 mg Subcutaneous Q24H  . metoprolol  5 mg Intravenous Q12H  . pantoprazole (PROTONIX) IV  40 mg Intravenous Q12H   Continuous Infusions: . sodium chloride 125 mL/hr at 09/19/13 0628   PRN Meds:.acetaminophen, acetaminophen, albuterol, ALPRAZolam, feeding supplement (ENSURE COMPLETE), guaiFENesin-dextromethorphan, hydrALAZINE, metoCLOPramide (REGLAN) injection, metoprolol, morphine injection, ondansetron (ZOFRAN) IV, oxyCODONE, promethazine   Antibiotics: Anti-infectives   None      Assessment/Plan Intractable vomiting  EGD-antral ulcers---Dr. Penelope Coop Gastric adenocarcinoma  -we will proceed with a diagnostic laparoscopy and distal gastrectomy and possible feeding tube placement on Friday -NPO after midnight -Cefoxitin on call to OR  -May give today's dose of lovenox, hold tomorrow -start TPN   Erby Pian, [facility] Surgery Pager [phone] Office [phone]  09/19/2013 11:21 AM

## 2013-09-20 ENCOUNTER — Inpatient Hospital Stay (HOSPITAL_COMMUNITY): Payer: No Typology Code available for payment source | Admitting: Anesthesiology

## 2013-09-20 ENCOUNTER — Encounter (HOSPITAL_COMMUNITY): Payer: Self-pay | Admitting: Certified Registered"

## 2013-09-20 ENCOUNTER — Encounter (HOSPITAL_COMMUNITY): Payer: No Typology Code available for payment source | Admitting: Anesthesiology

## 2013-09-20 ENCOUNTER — Encounter (HOSPITAL_COMMUNITY)
Admission: EM | Disposition: A | Discharge status: Home Health | Payer: Self-pay | Source: Home / Self Care | Attending: Internal Medicine

## 2013-09-20 HISTORY — PX: LAPAROSCOPY: SHX197

## 2013-09-20 LAB — COMPREHENSIVE METABOLIC PANEL
ALBUMIN: 3.3 g/dL — AB (ref 3.5–5.2)
ALT: 8 U/L (ref 0–35)
AST: 10 U/L (ref 0–37)
Alkaline Phosphatase: 51 U/L (ref 39–117)
Anion gap: 11 (ref 5–15)
BUN: 8 mg/dL (ref 6–23)
CALCIUM: 8.3 mg/dL — AB (ref 8.4–10.5)
CO2: 26 mEq/L (ref 19–32)
Chloride: 104 mEq/L (ref 96–112)
Creatinine, Ser: 0.68 mg/dL (ref 0.50–1.10)
GFR calc non Af Amer: >90 mL/min (ref >90)
GLUCOSE: 139 mg/dL — AB (ref 70–99)
Potassium: 3.1 mEq/L — ABNORMAL LOW (ref 3.7–5.3)
SODIUM: 141 meq/L (ref 137–147)
Total Bilirubin: 0.7 mg/dL (ref 0.3–1.2)
Total Protein: 6.1 g/dL (ref 6.0–8.3)

## 2013-09-20 LAB — GLUCOSE, CAPILLARY
GLUCOSE-CAPILLARY: 116 mg/dL — AB (ref 70–99)
Glucose-Capillary: 126 mg/dL — ABNORMAL HIGH (ref 70–99)
Glucose-Capillary: 122 mg/dL — ABNORMAL HIGH (ref 70–99)
Glucose-Capillary: 196 mg/dL — ABNORMAL HIGH (ref 70–99)

## 2013-09-20 LAB — DIFFERENTIAL
BASOS PCT: 0 % (ref 0–1)
Basophils Absolute: 0 10*3/uL (ref 0.0–0.1)
EOS ABS: 0.1 10*3/uL (ref 0.0–0.7)
Eosinophils Relative: 1 % (ref 0–5)
Lymphocytes Relative: 33 % (ref 12–46)
Lymphs Abs: 2.6 10*3/uL (ref 0.7–4.0)
Monocytes Absolute: 0.8 10*3/uL (ref 0.1–1.0)
Monocytes Relative: 10 % (ref 3–12)
NEUTROS PCT: 56 % (ref 43–77)
Neutro Abs: 4.4 10*3/uL (ref 1.7–7.7)

## 2013-09-20 LAB — PHOSPHORUS: Phosphorus: 2.2 mg/dL — ABNORMAL LOW (ref 2.3–4.6)

## 2013-09-20 LAB — CBC
HCT: 31.3 % — ABNORMAL LOW (ref 36.0–46.0)
Hemoglobin: 10.1 g/dL — ABNORMAL LOW (ref 12.0–15.0)
MCH: 26.4 pg (ref 26.0–34.0)
MCHC: 32.3 g/dL (ref 30.0–36.0)
MCV: 81.9 fL (ref 78.0–100.0)
Platelets: 289 10*3/uL (ref 150–400)
RBC: 3.82 MIL/uL — ABNORMAL LOW (ref 3.87–5.11)
RDW: 14.6 % (ref 11.5–15.5)
WBC: 7.9 10*3/uL (ref 4.0–10.5)

## 2013-09-20 LAB — SURGICAL PCR SCREEN
MRSA, PCR: NEGATIVE
STAPHYLOCOCCUS AUREUS: NEGATIVE

## 2013-09-20 LAB — TRIGLYCERIDES: TRIGLYCERIDES: 101 mg/dL (ref <150)

## 2013-09-20 LAB — PREALBUMIN: Prealbumin: 12.7 mg/dL — ABNORMAL LOW (ref 17.0–34.0)

## 2013-09-20 LAB — MAGNESIUM: Magnesium: 1.9 mg/dL (ref 1.5–2.5)

## 2013-09-20 SURGERY — LAPAROSCOPY, DIAGNOSTIC
Anesthesia: General

## 2013-09-20 MED ORDER — POTASSIUM CHLORIDE 10 MEQ/100ML IV SOLN
10.0000 meq | INTRAVENOUS | Status: AC
  Administered 2013-09-20 (×4): 10 meq via INTRAVENOUS
  Filled 2013-09-20 (×4): qty 100

## 2013-09-20 MED ORDER — SUCCINYLCHOLINE CHLORIDE 20 MG/ML IJ SOLN
INTRAMUSCULAR | Status: DC | PRN
  Administered 2013-09-20: 100 mg via INTRAVENOUS

## 2013-09-20 MED ORDER — PROMETHAZINE HCL 25 MG/ML IJ SOLN
INTRAMUSCULAR | Status: AC
Start: 2013-09-20 — End: 2013-09-21
  Filled 2013-09-20: qty 1

## 2013-09-20 MED ORDER — HYDROMORPHONE HCL PF 1 MG/ML IJ SOLN
0.2500 mg | INTRAMUSCULAR | Status: DC | PRN
  Administered 2013-09-20 (×4): 0.5 mg via INTRAVENOUS

## 2013-09-20 MED ORDER — METOCLOPRAMIDE HCL 5 MG/ML IJ SOLN
INTRAMUSCULAR | Status: AC
  Filled 2013-09-20: qty 2

## 2013-09-20 MED ORDER — DEXTROSE 5 % IV SOLN
1.0000 g | Freq: Four times a day (QID) | INTRAVENOUS | Status: AC
  Administered 2013-09-20 – 2013-09-21 (×3): 1 g via INTRAVENOUS
  Filled 2013-09-20 (×3): qty 1

## 2013-09-20 MED ORDER — LACTATED RINGERS IV SOLN
INTRAVENOUS | Status: DC
  Administered 2013-09-20 (×2): via INTRAVENOUS
  Administered 2013-09-20: 1000 mL via INTRAVENOUS

## 2013-09-20 MED ORDER — DEXAMETHASONE SODIUM PHOSPHATE 10 MG/ML IJ SOLN
INTRAMUSCULAR | Status: AC
  Filled 2013-09-20: qty 1

## 2013-09-20 MED ORDER — HYDROMORPHONE HCL PF 1 MG/ML IJ SOLN
INTRAMUSCULAR | Status: DC | PRN
  Administered 2013-09-20 (×2): 1 mg via INTRAVENOUS

## 2013-09-20 MED ORDER — DEXTROSE 5 % IV SOLN
INTRAVENOUS | Status: AC
  Filled 2013-09-20: qty 2

## 2013-09-20 MED ORDER — TRACE MINERALS CR-CU-F-FE-I-MN-MO-SE-ZN IV SOLN
INTRAVENOUS | Status: AC
  Administered 2013-09-20: 20:00:00 via INTRAVENOUS
  Filled 2013-09-20: qty 2000

## 2013-09-20 MED ORDER — NALOXONE HCL 0.4 MG/ML IJ SOLN: 0.4000 mg | INTRAMUSCULAR | Status: DC | PRN

## 2013-09-20 MED ORDER — ROCURONIUM BROMIDE 100 MG/10ML IV SOLN
INTRAVENOUS | Status: AC
  Filled 2013-09-20: qty 1

## 2013-09-20 MED ORDER — ONDANSETRON HCL 4 MG/2ML IJ SOLN
INTRAMUSCULAR | Status: DC | PRN
  Administered 2013-09-20: 4 mg via INTRAVENOUS

## 2013-09-20 MED ORDER — DEXAMETHASONE SODIUM PHOSPHATE 10 MG/ML IJ SOLN
INTRAMUSCULAR | Status: DC | PRN
  Administered 2013-09-20: 10 mg via INTRAVENOUS

## 2013-09-20 MED ORDER — ONDANSETRON HCL 4 MG/2ML IJ SOLN: 4.0000 mg | Freq: Four times a day (QID) | INTRAMUSCULAR | Status: DC | PRN

## 2013-09-20 MED ORDER — DIPHENHYDRAMINE HCL 12.5 MG/5ML PO ELIX: 12.5000 mg | ORAL_SOLUTION | Freq: Four times a day (QID) | ORAL | Status: DC | PRN

## 2013-09-20 MED ORDER — HYDROMORPHONE HCL PF 2 MG/ML IJ SOLN
INTRAMUSCULAR | Status: AC
  Filled 2013-09-20: qty 1

## 2013-09-20 MED ORDER — ONDANSETRON HCL 4 MG PO TABS: 4.0000 mg | ORAL_TABLET | Freq: Four times a day (QID) | ORAL | Status: DC | PRN

## 2013-09-20 MED ORDER — HYDROMORPHONE HCL PF 1 MG/ML IJ SOLN
INTRAMUSCULAR | Status: AC
  Filled 2013-09-20: qty 1

## 2013-09-20 MED ORDER — ROCURONIUM BROMIDE 100 MG/10ML IV SOLN
INTRAVENOUS | Status: DC | PRN
  Administered 2013-09-20: 30 mg via INTRAVENOUS
  Administered 2013-09-20 (×3): 10 mg via INTRAVENOUS

## 2013-09-20 MED ORDER — DIPHENHYDRAMINE HCL 50 MG/ML IJ SOLN: 12.5000 mg | Freq: Four times a day (QID) | INTRAMUSCULAR | Status: DC | PRN

## 2013-09-20 MED ORDER — PROPOFOL 10 MG/ML IV BOLUS
INTRAVENOUS | Status: AC
  Filled 2013-09-20: qty 20

## 2013-09-20 MED ORDER — MORPHINE SULFATE (PF) 1 MG/ML IV SOLN
INTRAVENOUS | Status: AC
  Filled 2013-09-20: qty 25

## 2013-09-20 MED ORDER — GLYCOPYRROLATE 0.2 MG/ML IJ SOLN
INTRAMUSCULAR | Status: DC | PRN
  Administered 2013-09-20: 0.4 mg via INTRAVENOUS

## 2013-09-20 MED ORDER — LIDOCAINE HCL (CARDIAC) 20 MG/ML IV SOLN
INTRAVENOUS | Status: DC | PRN
  Administered 2013-09-20: 100 mg via INTRAVENOUS

## 2013-09-20 MED ORDER — SODIUM CHLORIDE 0.9 % IJ SOLN: 9.0000 mL | INTRAMUSCULAR | Status: DC | PRN

## 2013-09-20 MED ORDER — NEOSTIGMINE METHYLSULFATE 10 MG/10ML IV SOLN
INTRAVENOUS | Status: DC | PRN
  Administered 2013-09-20: 3 mg via INTRAVENOUS

## 2013-09-20 MED ORDER — FENTANYL CITRATE 0.05 MG/ML IJ SOLN
INTRAMUSCULAR | Status: DC | PRN
  Administered 2013-09-20: 100 ug via INTRAVENOUS
  Administered 2013-09-20 (×3): 50 ug via INTRAVENOUS

## 2013-09-20 MED ORDER — STERILE WATER FOR IRRIGATION IR SOLN
Status: DC | PRN
  Administered 2013-09-20: 1500 mL

## 2013-09-20 MED ORDER — FENTANYL CITRATE 0.05 MG/ML IJ SOLN
INTRAMUSCULAR | Status: AC
  Filled 2013-09-20: qty 5

## 2013-09-20 MED ORDER — PROPOFOL 10 MG/ML IV BOLUS
INTRAVENOUS | Status: DC | PRN
  Administered 2013-09-20: 200 mg via INTRAVENOUS

## 2013-09-20 MED ORDER — SODIUM CHLORIDE 0.9 % IV SOLN
INTRAVENOUS | Status: AC
  Administered 2013-09-20: 19:00:00 via INTRAVENOUS

## 2013-09-20 MED ORDER — GLYCOPYRROLATE 0.2 MG/ML IJ SOLN
INTRAMUSCULAR | Status: AC
  Filled 2013-09-20: qty 2

## 2013-09-20 MED ORDER — BUPIVACAINE 0.25 % ON-Q PUMP DUAL CATH 300 ML
300.0000 mL | INJECTION | Status: DC
  Filled 2013-09-20: qty 300

## 2013-09-20 MED ORDER — ONDANSETRON HCL 4 MG/2ML IJ SOLN
INTRAMUSCULAR | Status: AC
  Filled 2013-09-20: qty 2

## 2013-09-20 MED ORDER — CARVEDILOL 6.25 MG PO TABS
6.2500 mg | ORAL_TABLET | Freq: Two times a day (BID) | ORAL | Status: DC
  Administered 2013-09-21 – 2013-09-23 (×5): 6.25 mg via ORAL
  Filled 2013-09-20 (×7): qty 1

## 2013-09-20 MED ORDER — ENOXAPARIN SODIUM 40 MG/0.4ML ~~LOC~~ SOLN
40.0000 mg | SUBCUTANEOUS | Status: DC
  Administered 2013-09-21 – 2013-10-11 (×17): 40 mg via SUBCUTANEOUS
  Filled 2013-09-20 (×23): qty 0.4

## 2013-09-20 MED ORDER — FAT EMULSION 20 % IV EMUL
250.0000 mL | INTRAVENOUS | Status: AC
Start: 2013-09-20 — End: 2013-09-21
  Administered 2013-09-20: 250 mL via INTRAVENOUS
  Filled 2013-09-20: qty 250

## 2013-09-20 MED ORDER — MIDAZOLAM HCL 2 MG/2ML IJ SOLN
INTRAMUSCULAR | Status: AC
  Filled 2013-09-20: qty 2

## 2013-09-20 MED ORDER — INSULIN ASPART 100 UNIT/ML ~~LOC~~ SOLN
SUBCUTANEOUS | Status: AC
Start: 2013-09-20 — End: 2013-09-21
  Filled 2013-09-20: qty 1

## 2013-09-20 MED ORDER — PROMETHAZINE HCL 25 MG/ML IJ SOLN
6.2500 mg | INTRAMUSCULAR | Status: DC | PRN
  Administered 2013-09-20: 6.25 mg via INTRAVENOUS

## 2013-09-20 MED ORDER — BUPIVACAINE 0.25 % ON-Q PUMP DUAL CATH 300 ML
INJECTION | Status: DC | PRN
  Administered 2013-09-20: 300 mL

## 2013-09-20 MED ORDER — 0.9 % SODIUM CHLORIDE (POUR BTL) OPTIME
TOPICAL | Status: DC | PRN
  Administered 2013-09-20: 1000 mL

## 2013-09-20 MED ORDER — MIDAZOLAM HCL 5 MG/5ML IJ SOLN
INTRAMUSCULAR | Status: DC | PRN
  Administered 2013-09-20: 2 mg via INTRAVENOUS

## 2013-09-20 MED ORDER — BUPIVACAINE ON-Q PAIN PUMP (FOR ORDER SET NO CHG)
INJECTION | Status: AC
  Filled 2013-09-20: qty 1

## 2013-09-20 MED ORDER — LIDOCAINE HCL (CARDIAC) 20 MG/ML IV SOLN
INTRAVENOUS | Status: AC
  Filled 2013-09-20: qty 5

## 2013-09-20 MED ORDER — METOCLOPRAMIDE HCL 5 MG/ML IJ SOLN
INTRAMUSCULAR | Status: DC | PRN
  Administered 2013-09-20: 10 mg via INTRAVENOUS

## 2013-09-20 MED ORDER — ONDANSETRON HCL 4 MG/2ML IJ SOLN
4.0000 mg | Freq: Four times a day (QID) | INTRAMUSCULAR | Status: DC | PRN
  Administered 2013-09-20 – 2013-09-21 (×2): 4 mg via INTRAVENOUS
  Filled 2013-09-20 (×2): qty 2

## 2013-09-20 MED ORDER — PHENYLEPHRINE HCL 10 MG/ML IJ SOLN
INTRAMUSCULAR | Status: DC | PRN
  Administered 2013-09-20 (×5): 80 ug via INTRAVENOUS

## 2013-09-20 MED ORDER — MORPHINE SULFATE (PF) 1 MG/ML IV SOLN
INTRAVENOUS | Status: DC
  Administered 2013-09-20: 17:00:00 via INTRAVENOUS
  Administered 2013-09-20: 16.09 mg via INTRAVENOUS
  Administered 2013-09-20: 1.5 mg via INTRAVENOUS

## 2013-09-20 SURGICAL SUPPLY — 56 items
BENZOIN TINCTURE PRP APPL 2/3 (GAUZE/BANDAGES/DRESSINGS) IMPLANT
CANISTER SUCTION 2500CC (MISCELLANEOUS) ×2 IMPLANT
CATH KIT ON-Q SILVERSOAK 5IN (CATHETERS) ×4 IMPLANT
CLIP TI MEDIUM 6 (CLIP) ×2 IMPLANT
DECANTER SPIKE VIAL GLASS SM (MISCELLANEOUS) IMPLANT
DERMABOND ADVANCED (GAUZE/BANDAGES/DRESSINGS)
DERMABOND ADVANCED .7 DNX12 (GAUZE/BANDAGES/DRESSINGS) IMPLANT
DRAIN CHANNEL RND F F (WOUND CARE) ×2 IMPLANT
DRAPE LAPAROSCOPIC ABDOMINAL (DRAPES) ×2 IMPLANT
DRAPE TOWEL STR TPT 18X26 WHT (DRAPES) ×4 IMPLANT
DRSG TELFA 4X10 ISLAND STR (GAUZE/BANDAGES/DRESSINGS) ×2 IMPLANT
ELECT REM PT RETURN 9FT ADLT (ELECTROSURGICAL) ×2
ELECTRODE REM PT RTRN 9FT ADLT (ELECTROSURGICAL) ×1 IMPLANT
EVACUATOR SILICONE 100CC (DRAIN) ×2 IMPLANT
GLOVE BIO SURGEON STRL SZ 6 (GLOVE) ×2 IMPLANT
GLOVE INDICATOR 6.5 STRL GRN (GLOVE) ×2 IMPLANT
GOWN STRL REIN 2XL LVL4 (GOWN DISPOSABLE) ×2 IMPLANT
GOWN STRL REUS W/ TWL XL LVL3 (GOWN DISPOSABLE) ×3 IMPLANT
GOWN STRL REUS W/TWL 2XL LVL3 (GOWN DISPOSABLE) ×2 IMPLANT
GOWN STRL REUS W/TWL XL LVL3 (GOWN DISPOSABLE) ×3
KIT BASIN OR (CUSTOM PROCEDURE TRAY) ×2 IMPLANT
PLUG CATH AND CAP STER (CATHETERS) ×2 IMPLANT
RELOAD PROXIMATE 75MM BLUE (ENDOMECHANICALS) ×6 IMPLANT
SET IRRIG TUBING LAPAROSCOPIC (IRRIGATION / IRRIGATOR) ×2 IMPLANT
SHEARS FOC LG CVD HARMONIC 17C (MISCELLANEOUS) ×2 IMPLANT
SHEARS HARMONIC ACE PLUS 36CM (ENDOMECHANICALS) IMPLANT
SOLUTION ANTI FOG 6CC (MISCELLANEOUS) ×2 IMPLANT
SPONGE DRAIN TRACH 4X4 STRL 2S (GAUZE/BANDAGES/DRESSINGS) ×2 IMPLANT
SPONGE LAP 18X18 X RAY DECT (DISPOSABLE) ×2 IMPLANT
STAPLER GUN LINEAR PROX 60 (STAPLE) ×2 IMPLANT
STAPLER PROXIMATE 75MM BLUE (STAPLE) ×2 IMPLANT
STAPLER VISISTAT 35W (STAPLE) ×2 IMPLANT
STRIP CLOSURE SKIN 1/2X4 (GAUZE/BANDAGES/DRESSINGS) IMPLANT
SUT ETHILON 2 0 PS N (SUTURE) ×6 IMPLANT
SUT PDS AB 1 TP1 96 (SUTURE) ×4 IMPLANT
SUT PDS AB 3-0 SH 27 (SUTURE) ×6 IMPLANT
SUT SILK 2 0 (SUTURE) ×1
SUT SILK 2 0 SH CR/8 (SUTURE) ×4 IMPLANT
SUT SILK 2-0 18XBRD TIE 12 (SUTURE) ×1 IMPLANT
SUT SILK 3 0 (SUTURE) ×1
SUT SILK 3 0 SH CR/8 (SUTURE) ×2 IMPLANT
SUT SILK 3-0 18XBRD TIE 12 (SUTURE) ×1 IMPLANT
SUT VIC AB 4-0 PS2 27 (SUTURE) ×2 IMPLANT
SYR BULB IRRIGATION 50ML (SYRINGE) ×2 IMPLANT
TOWEL OR 17X26 10 PK STRL BLUE (TOWEL DISPOSABLE) ×6 IMPLANT
TRAY FOLEY CATH 14FRSI W/METER (CATHETERS) ×2 IMPLANT
TRAY LAP CHOLE (CUSTOM PROCEDURE TRAY) ×2 IMPLANT
TROCAR BLADELESS OPT 5 75 (ENDOMECHANICALS) ×2 IMPLANT
TROCAR SLEEVE XCEL 5X75 (ENDOMECHANICALS) ×2 IMPLANT
TROCAR XCEL BLUNT TIP 100MML (ENDOMECHANICALS) IMPLANT
TROCAR XCEL NON-BLD 11X100MML (ENDOMECHANICALS) IMPLANT
TROCAR XCEL UNIV SLVE 11M 100M (ENDOMECHANICALS) IMPLANT
TUBE MOSS GAS 18FR (TUBING) ×4 IMPLANT
TUBING INSUFFLATION 10FT LAP (TUBING) ×2 IMPLANT
TUNNELER SHEATH ON-Q 16GX12 DP (PAIN MANAGEMENT) ×2 IMPLANT
WATER STERILE IRR 1500ML POUR (IV SOLUTION) ×2 IMPLANT

## 2013-09-20 NOTE — Op Note (Signed)
PRE-OPERATIVE DIAGNOSIS: Gastric adenocarcinoma  POST-OPERATIVE DIAGNOSIS:  Same  PROCEDURE:  Procedure(s): Diagnostic laparoscopy, distal gastrectomy with billroth II anastamosis and feeding gastrojejunostomy tube  SURGEON:  Surgeon(s): [name], MD  Assistant:  Alvira Monday, RNFA  ANESTHESIA:   general  DRAINS: (45 Fr) Blake drain(s) in the RUQ and a gastrojejunostomy tube.     LOCAL MEDICATIONS USED:  BUPIVICAINE   SPECIMEN:  Source of Specimen:  antrum, omentum, perigastric nodes, additional body of stomach  DISPOSITION OF SPECIMEN:  PATHOLOGY  COUNTS:  YES  DICTATION: .Dragon Dictation  PLAN OF CARE: Admit to inpatient   PATIENT DISPOSITION:  PACU - hemodynamically stable.   EBL: 100 mL  FINDINGS: no evidence of metastatic disease, small area of thickening in the antrum  PROCEDURE:    Patient was taken to the operating room where she was placed supine on the operating room table. General anesthesia was induced.  The patient's abdomen was prepped and draped in sterile fashion.  The timeout was performed according to surgical safety check list. When all was correct, we continued.    A 5 mm subcostal incision was placed on the left.  A 5 mm Optiview trocar was placed under direct visualization.  Pneumoperitoneum was achieved to a pressure of 15 mm Hg.  An additional trocar was placed in the midline.  No evidence of metastatic disease was found.  A midline incision was made with a #10 blade.  The subcutaneous tissues were divided with the cautery.  The fascia was divided with the cautery as well.  The falciform was taken down with the harmonic scalpel.  The bookwalter retractor was set up for assistance with visualization.    The omentum was taken off the colon.  The lesser sac was opened.   There was no palpable mass at the antrum.  The stomach was mobilized off the head of the pancreas.  The omentum was taken off to the splenic flexure and some of the short  gastrics were taken down with the harmonic scalpel.  The stomach was divided with the GIA at the antrum.  The harmonic was used to divide the perigastric soft tissue.  The duodenum was divided with the TA 60.  The lymph nodes were skeletonized off the gastric artery, the common hepatic artery, and the porta hepatis.  A partial omentectomy was performed. The stomach was then further mobilized and a moss GJ tube was pulled through the abdominal wall with a tonsil.  Two pursestring sutures were placed into the stomach.  The gastrotomy was made with the cautery and the tube inserted into the stomach.  The balloon was inflated in the stomach and the pursestring sutures were tied down.    A stapled retrocolic gastrojejunostomy was created with a GIA 75.  The GJ tube was pulled into the efferent limb.  The defect was closed with two 2-0 PDS sutures.  The apex of the GJ was reapproximated with 2-0 silks.  The stomach was pexed to the abdominal wall with 2-0 silk sutures.  A 19 Fr/ Blake drain was placed into the RUQ near the duodenal stump.  The OnQ tunnelers were placed into the pre peritoneal space.    The abdomen was irrigated.  The fascia was closed with running #1 looped PDS suture x2.  The skin was irrigated and closed with staples.  The catheters were advanced through the tunnelers and the tunnelers were removed. The patient was awakened from anesthesia and taken to the PACU in stable condition.  Needle, sponge, and instrument counts were correct x 2.

## 2013-09-20 NOTE — Anesthesia Preprocedure Evaluation (Addendum)
Anesthesia Evaluation  Patient identified by MRN, date of birth, ID band Patient awake    Reviewed: Allergy & Precautions, H&P , NPO status , Patient's Chart, lab work & pertinent test results, reviewed documented beta blocker date and time   Airway Mallampati: III TM Distance: >3 FB Neck ROM: Full    Dental  (+) Teeth Intact, Dental Advisory Given   Pulmonary former smoker,  breath sounds clear to auscultation        Cardiovascular hypertension, + Valvular Problems/Murmurs MR Rhythm:Regular Rate:Normal + Systolic murmurs    Neuro/Psych  Headaches, PSYCHIATRIC DISORDERS    GI/Hepatic Neg liver ROS, PUD, GERD-  ,Gastric cancer   Endo/Other    Renal/GU negative Renal ROS     Musculoskeletal negative musculoskeletal ROS (+)   Abdominal   Peds  Hematology  (+) anemia ,   Anesthesia Other Findings   Reproductive/Obstetrics negative OB ROS                          Anesthesia Physical Anesthesia Plan  ASA: III  Anesthesia Plan: General   Post-op Pain Management:    Induction: Intravenous  Airway Management Planned: Oral ETT  Additional Equipment: None  Intra-op Plan:   Post-operative Plan: Extubation in OR  Informed Consent: I have reviewed the patients History and Physical, chart, labs and discussed the procedure including the risks, benefits and alternatives for the proposed anesthesia with the patient or authorized representative who has indicated his/her understanding and acceptance.     Plan Discussed with: CRNA and Surgeon  Anesthesia Plan Comments:         Anesthesia Quick Evaluation

## 2013-09-20 NOTE — Progress Notes (Signed)
Agree with above. Surgery today.

## 2013-09-20 NOTE — Transfer of Care (Signed)
Immediate Anesthesia Transfer of Care Note  Patient: Paula [name]  Procedure(s) Performed: Procedure(s): DIAGNOSTIC LAPAROSCOPY,DISTAL GASTRECTOMY AND FEEDING GASTROJEJUNOSTOMY (N/A)  Patient Location: PACU  Anesthesia Type:General  Level of Consciousness: sedated  Airway & Oxygen Therapy: Patient Spontanous Breathing and Patient connected to face mask oxygen  Post-op Assessment: Report given to PACU RN and Post -op Vital signs reviewed and stable  Post vital signs: Reviewed and stable  Complications: No apparent anesthesia complications

## 2013-09-20 NOTE — Progress Notes (Signed)
PARENTERAL NUTRITION CONSULT NOTE - Follow Up  Pharmacy Consult for TNA Indication: for massive bowel resection 7/31  No Known Allergies  Patient Measurements: Height: 5\' 5"  (165.1 cm) Weight: 156 lb 1.4 oz (70.8 kg) IBW/kg (Calculated) : 57 Adjusted Body Weight: 62 3-7 lb weight loss per RD notes  Vital Signs: Temp: 97.8 F (36.6 C) (07/31 0634) Temp src: Oral (07/31 0634) BP: 111/70 mmHg (07/31 0634) Pulse Rate: 68 (07/31 0634) Intake/Output from previous day: 07/30 0701 - 07/31 0700 In: 3597.5 [P.O.:460; I.V.:3000; TPN:137.5] Out: -  Intake/Output from this shift: Total I/O In: 200 [IV Piggyback:200] Out: -   Labs:  Recent Labs  09/19/13 0416 09/20/13 0450  WBC 8.2 7.9  HGB 11.8* 10.1*  HCT 36.1 31.3*  PLT 367 289     Recent Labs  09/18/13 1430 09/19/13 0416 09/20/13 0450  NA 133* 133* 141  K 3.6* 2.8* 3.1*  CL 92* 94* 104  CO2 23 23 26   GLUCOSE 91 86 139*  BUN 8 8 8   CREATININE 0.74 0.65 0.68  CALCIUM 9.1 8.5 8.3*  MG  --   --  1.9  PHOS  --   --  2.2*  PROT  --   --  6.1  ALBUMIN  --   --  3.3*  AST  --   --  10  ALT  --   --  8  ALKPHOS  --   --  51  BILITOT  --   --  0.7  PREALBUMIN 17.3*  --   --   TRIG  --   --  101   Estimated Creatinine Clearance: 86.7 ml/min (by C-G formula based on Cr of 0.68).    Recent Labs  09/20/13 0018 09/20/13 0609  GLUCAP 116* 126*    Medical History: Past Medical History  Diagnosis Date  . Headache(784.0) 2011    Migraines  . Esophageal reflux     On omeprazole  . Costochondritis   . Hypertension     started around 2011  . Ovarian cyst     Medications:  Scheduled:  . cefOXitin  2 g Intravenous On Call to OR  . insulin aspart  0-9 Units Subcutaneous 4 times per day  . metoprolol  5 mg Intravenous Q12H  . pantoprazole (PROTONIX) IV  40 mg Intravenous Q12H  . potassium chloride  10 mEq Intravenous Q1 Hr x 4   Infusions:  . sodium chloride 125 mL/hr at 09/20/13 0607  . Marland KitchenTPN  (CLINIMIX-E) Adult 40 mL/hr at 09/19/13 2015   And  . fat emulsion 250 mL (09/19/13 2015)    Insulin Requirements in the past 24 hours:  1 unit  Current Nutrition:  NPO starting midnight 7/31 in prep for surgery  Assessment: 48 yo female with newly diagnosed poorly differentiated gastric adenocarcinoma with plan per [facility] of massive bowel resection scheduled for 7/31. To place PICC line and go ahead and start TNA per pharmacy dosing this evening if possible.  Lytes: K 3.1 - replacement IV orders already ordered this AM per Md. Phos 2.2 - just below normal limits Renal: stable, CrCl 87 ml/min Hepatic: WNL CBGs: stable TGs: 101 (7/31) Prealb: pending IV access: PICC placed 7/30 TNA Day #: 2  Nutritional Goals:   Per RD assessment, 1800-2000 kCal,  85-95 grams of protein per day  Clinimix 5/15 at goal rate of 80 ml/hr + lipid to provide 96g protein and 1843 kcal/day  Plan: at 1800: 1) Increase Clinimix E 5/15 to  rate of 60 ml/hr and continue Lipids at rate of 10 ml/hr 2) Continue q6h CBG/SS 3) Mon/Thurs TNA labs 4) Decrease IV fluids to 55 ml/hr when new TNA starts at 6pm 5) Phos just below normal limits - will recheck tomorrow AM   Adrian Saran, PharmD, BCPS Pager [phone] 09/20/2013 10:38 AM

## 2013-09-20 NOTE — Progress Notes (Signed)
Patient ID: Paula [name], female   DOB: 1965/11/08, 48 y.o.   MRN: [id number]  Subjective: Had a BM.  No vomiting. Afebrile.  VSS.  Objective:  Vital signs:  Filed Vitals:   09/19/13 0433 09/19/13 1325 09/19/13 2136 09/20/13 0634  BP: 157/72 146/80 138/77 111/70  Pulse: 87 69 101 68  Temp: 98.1 F (36.7 C) 97.6 F (36.4 C) 98.3 F (36.8 C) 97.8 F (36.6 C)  TempSrc: Oral Oral Oral Oral  Resp: _0 Height:      Weight:      SpO2: 100% 100% 100% 100%    Last BM Date: 09/14/13  Intake/Output   Yesterday:  07/30 0701 - 07/31 0700 In: 3597.5 [P.O.:460; I.V.:3000; TPN:137.5] Out: -  This shift:  Total I/O In: 200 [IV Piggyback:200] Out: -   Physical Exam: General: Pt awake/alert/oriented x4 in no acute distress Chest: cta.   No chest wall pain w good excursion CV:  Pulses intact.  Regular rhythm Abdomen: Soft.  Nondistended.  Non tender.  No evidence of peritonitis.  No incarcerated hernias. Ext:  SCDs BLE.  No mjr edema.  No cyanosis Skin: No petechiae / purpura   Problem List:   Principal Problem:   Chest pain Active Problems:   Essential hypertension   Migraine headache   Intractable vomiting   Antral ulcer   Gastric carcinoma   Malnutrition of moderate degree    Results:   Labs: Results for orders placed during the hospital encounter of 09/14/13 (from the past 48 hour(s))  CEA     Status: None   Collection Time    09/18/13  2:30 PM      Result Value Ref Range   CEA 0.6  0.0 - 5.0 ng/mL   Comment: Performed at [facility]     Status: None   Collection Time    09/18/13  2:30 PM      Result Value Ref Range   LDH 172  94 - 250 U/L  BASIC METABOLIC PANEL     Status: Abnormal   Collection Time    09/18/13  2:30 PM      Result Value Ref Range   Sodium 133 (*) 137 - 147 mEq/L   Potassium 3.6 (*) 3.7 - 5.3 mEq/L   Chloride 92 (*) 96 - 112 mEq/L   CO2 23  19 - 32 mEq/L   Glucose, Bld 91  70 - 99  mg/dL   BUN 8  6 - 23 mg/dL   Creatinine, Ser 0.74  0.50 - 1.10 mg/dL   Calcium 9.1  8.4 - 10.5 mg/dL   GFR calc non Af Amer >90  >90 mL/min   GFR calc Af Amer >90  >90 mL/min   Comment: (NOTE)     The eGFR has been calculated using the CKD EPI equation.     This calculation has not been validated in all clinical situations.     eGFR's persistently <90 mL/min signify possible Chronic Kidney     Disease.   Anion gap 18 (*) 5 - 15  PREALBUMIN     Status: Abnormal   Collection Time    09/18/13  2:30 PM      Result Value Ref Range   Prealbumin 17.3 (*) 17.0 - 34.0 mg/dL   Comment: Performed at [facility]     Status: Abnormal   Collection Time    09/19/13  4:16 AM  Result Value Ref Range   Sodium 133 (*) 137 - 147 mEq/L   Potassium 2.8 (*) 3.7 - 5.3 mEq/L   Comment: CRITICAL RESULT CALLED TO, READ BACK BY AND VERIFIED WITH:     SPOKE WITH MBURU,J RN [phone] [id number] COVINGTON,N     DELTA CHECK NOTED     REPEATED TO VERIFY   Chloride 94 (*) 96 - 112 mEq/L   CO2 23  19 - 32 mEq/L   Glucose, Bld 86  70 - 99 mg/dL   BUN 8  6 - 23 mg/dL   Creatinine, Ser 0.65  0.50 - 1.10 mg/dL   Calcium 8.5  8.4 - 10.5 mg/dL   GFR calc non Af Amer >90  >90 mL/min   GFR calc Af Amer >90  >90 mL/min   Comment: (NOTE)     The eGFR has been calculated using the CKD EPI equation.     This calculation has not been validated in all clinical situations.     eGFR's persistently <90 mL/min signify possible Chronic Kidney     Disease.   Anion gap 16 (*) 5 - 15  HIV ANTIBODY (ROUTINE TESTING)     Status: None   Collection Time    09/19/13  4:16 AM      Result Value Ref Range   HIV 1&2 Ab, 4th Generation NONREACTIVE  NONREACTIVE   Comment: (NOTE)     A NONREACTIVE HIV Ag/Ab result does not exclude HIV infection since     the time frame for seroconversion is variable. If acute HIV infection     is suspected, a HIV-1 RNA Qualitative TMA test is recommended.     HIV-1/2  Antibody Diff         Not indicated.     HIV-1 RNA, Qual TMA           Not indicated.     PLEASE NOTE: This information has been disclosed to you from records     whose confidentiality may be protected by state law. If your state     requires such protection, then the state law prohibits you from making     any further disclosure of the information without the specific written     consent of the person to whom it pertains, or as otherwise permitted     by law. A general authorization for the release of medical or other     information is NOT sufficient for this purpose.     The performance of this assay has not been clinically validated in     patients less than 22 years old.     Performed at [facility]  CBC     Status: Abnormal   Collection Time    09/19/13  4:16 AM      Result Value Ref Range   WBC 8.2  4.0 - 10.5 K/uL   RBC 4.52  3.87 - 5.11 MIL/uL   Hemoglobin 11.8 (*) 12.0 - 15.0 g/dL   HCT 36.1  36.0 - 46.0 %   MCV 79.9  78.0 - 100.0 fL   MCH 26.1  26.0 - 34.0 pg   MCHC 32.7  30.0 - 36.0 g/dL   RDW 14.4  11.5 - 15.5 %   Platelets 367  150 - 400 K/uL   Comment: REPEATED TO VERIFY     DELTA CHECK NOTED  GLUCOSE, CAPILLARY     Status: Abnormal   Collection Time    09/20/13 12:18 AM  Result Value Ref Range   Glucose-Capillary 116 (*) 70 - 99 mg/dL   Comment 1 Documented in Chart     Comment 2 Notify RN    SURGICAL PCR SCREEN     Status: None   Collection Time    09/20/13  3:04 AM      Result Value Ref Range   MRSA, PCR NEGATIVE  NEGATIVE   Staphylococcus aureus NEGATIVE  NEGATIVE   Comment:            The Xpert SA Assay (FDA     approved for NASAL specimens     in patients over 52 years of age),     is one component of     a comprehensive surveillance     program.  Test performance has     been validated by [employer] for patients greater     than or equal to 44 year old.     It is not intended     to diagnose infection nor to     guide or  monitor treatment.  COMPREHENSIVE METABOLIC PANEL     Status: Abnormal   Collection Time    09/20/13  4:50 AM      Result Value Ref Range   Sodium 141  137 - 147 mEq/L   Comment: DELTA CHECK NOTED   Potassium 3.1 (*) 3.7 - 5.3 mEq/L   Chloride 104  96 - 112 mEq/L   Comment: DELTA CHECK NOTED   CO2 26  19 - 32 mEq/L   Glucose, Bld 139 (*) 70 - 99 mg/dL   BUN 8  6 - 23 mg/dL   Creatinine, Ser 0.68  0.50 - 1.10 mg/dL   Calcium 8.3 (*) 8.4 - 10.5 mg/dL   Total Protein 6.1  6.0 - 8.3 g/dL   Albumin 3.3 (*) 3.5 - 5.2 g/dL   AST 10  0 - 37 U/L   ALT 8  0 - 35 U/L   Alkaline Phosphatase 51  39 - 117 U/L   Total Bilirubin 0.7  0.3 - 1.2 mg/dL   GFR calc non Af Amer >90  >90 mL/min   GFR calc Af Amer >90  >90 mL/min   Comment: (NOTE)     The eGFR has been calculated using the CKD EPI equation.     This calculation has not been validated in all clinical situations.     eGFR's persistently <90 mL/min signify possible Chronic Kidney     Disease.   Anion gap 11  5 - 15  MAGNESIUM     Status: None   Collection Time    09/20/13  4:50 AM      Result Value Ref Range   Magnesium 1.9  1.5 - 2.5 mg/dL  PHOSPHORUS     Status: Abnormal   Collection Time    09/20/13  4:50 AM      Result Value Ref Range   Phosphorus 2.2 (*) 2.3 - 4.6 mg/dL  TRIGLYCERIDES     Status: None   Collection Time    09/20/13  4:50 AM      Result Value Ref Range   Triglycerides 101  <150 mg/dL   Comment: Performed at [facility]  CBC     Status: Abnormal   Collection Time    09/20/13  4:50 AM      Result Value Ref Range   WBC 7.9  4.0 - 10.5 K/uL   RBC 3.82 (*)  3.87 - 5.11 MIL/uL   Hemoglobin 10.1 (*) 12.0 - 15.0 g/dL   HCT 31.3 (*) 36.0 - 46.0 %   MCV 81.9  78.0 - 100.0 fL   MCH 26.4  26.0 - 34.0 pg   MCHC 32.3  30.0 - 36.0 g/dL   RDW 14.6  11.5 - 15.5 %   Platelets 289  150 - 400 K/uL  DIFFERENTIAL     Status: None   Collection Time    09/20/13  4:50 AM      Result Value Ref Range   Neutrophils  Relative % 56  43 - 77 %   Neutro Abs 4.4  1.7 - 7.7 K/uL   Lymphocytes Relative 33  12 - 46 %   Lymphs Abs 2.6  0.7 - 4.0 K/uL   Monocytes Relative 10  3 - 12 %   Monocytes Absolute 0.8  0.1 - 1.0 K/uL   Eosinophils Relative 1  0 - 5 %   Eosinophils Absolute 0.1  0.0 - 0.7 K/uL   Basophils Relative 0  0 - 1 %   Basophils Absolute 0.0  0.0 - 0.1 K/uL  GLUCOSE, CAPILLARY     Status: Abnormal   Collection Time    09/20/13  6:09 AM      Result Value Ref Range   Glucose-Capillary 126 (*) 70 - 99 mg/dL    Imaging / Studies: Mr [name] Contrast  September 29, 2013   CLINICAL DATA:  Headaches. Newly diagnosed gastric cancer. Head injury on 07/18/2013.  EXAM: MRI HEAD WITHOUT AND WITH CONTRAST  TECHNIQUE: Multiplanar, multiecho pulse sequences of the brain and surrounding structures were obtained without and with intravenous contrast.  CONTRAST:  44m MULTIHANCE GADOBENATE DIMEGLUMINE 529 MG/ML IV SOLN  COMPARISON:  None.  FINDINGS: There is a 6 mm heterogeneously T1 hyperintense subdural hematoma along the left cerebral convexity. There is mild, diffuse dural enhancement in the left cerebral hemisphere. No right-sided subdural collection or dural thickening/ enhancement is identified.  There is no acute infarct, mass, or midline shift. Brain parenchyma is normal in signal without abnormal enhancement. Ventricles are normal for age.  Orbits are unremarkable. Major intracranial vascular flow voids are preserved.  IMPRESSION: 1. Small subdural hematoma along the left cerebral convexity without significant mass effect. Smooth dural enhancement throughout the left cerebral hemisphere is most likely reactive, without masslike enhancement seen to suggest metastatic disease. This is most likely related to patient's recent trauma. 2. No evidence of parenchymal brain metastases. Critical Value/emergent results were called by telephone at the time of interpretation on 72015/08/09at 2:57 pm to Dr. VDillard Essex who verbally  acknowledged these results.   Electronically Signed   By: ALogan Bores  On: 0Aug 09, 201514:57    Scheduled Meds: . cefOXitin  2 g Intravenous On Call to OR  . insulin aspart  0-9 Units Subcutaneous 4 times per day  . metoprolol  5 mg Intravenous Q12H  . pantoprazole (PROTONIX) IV  40 mg Intravenous Q12H  . potassium chloride  10 mEq Intravenous Q1 Hr x 4   Continuous Infusions: . sodium chloride 125 mL/hr at 09/20/13 0607  . .Marland KitchenPN (CLINIMIX-E) Adult 40 mL/hr at 008-09-152015   And  . fat emulsion 250 mL (02015-08-092015)   PRN Meds:.acetaminophen, acetaminophen, albuterol, ALPRAZolam, guaiFENesin-dextromethorphan, hydrALAZINE, HYDROmorphone (DILAUDID) injection, metoCLOPramide (REGLAN) injection, metoprolol, ondansetron (ZOFRAN) IV, oxyCODONE, promethazine, sodium chloride   Antibiotics: Anti-infectives   Start     Dose/Rate Route Frequency Ordered Stop  09/20/13 0600  cefOXitin (MEFOXIN) 2 g in dextrose 5 % 50 mL IVPB    Comments:  Pharmacy may adjust dosing strength, interval, or rate of medication as needed for optimal therapy for the patient Send with patient on call to the OR.  Anesthesia to complete antibiotic administration <32mn [facility]   2 g 100 mL/hr over 30 Minutes Intravenous On call to O.R. 09/19/13 1119 09/21/13 0559      Assessment/Plan  Intractable vomiting  EGD-antral ulcers---Dr. GPenelope Coop Gastric adenocarcinoma  -To OR today at 12:45 for diagnostic laparoscopy and distal gastrectomy and possible feeding tube placement    Consent is signed, pt NPO, lovenox on hold.  K supplemented -cefoxitin on call to OR -continue with TPN EErby Pian [facility] Pager [phone] [phone] 09/20/2013 9:45 AM

## 2013-09-20 NOTE — Anesthesia Postprocedure Evaluation (Signed)
  Anesthesia Post-op Note  Patient: Paula [name]  Procedure(s) Performed: Procedure(s) (LRB): DIAGNOSTIC LAPAROSCOPY,DISTAL GASTRECTOMY AND FEEDING GASTROJEJUNOSTOMY (N/A)  Patient Location: PACU  Anesthesia Type: General  Level of Consciousness: awake and alert   Airway and Oxygen Therapy: Patient Spontanous Breathing  Post-op Pain: mild  Post-op Assessment: Post-op Vital signs reviewed, Patient's Cardiovascular Status Stable, Respiratory Function Stable, Patent Airway and No signs of Nausea or vomiting  Last Vitals:  Filed Vitals:   09/20/13 1649  BP:   Pulse:   Temp:   Resp: 18    Post-op Vital Signs: stable   Complications: No apparent anesthesia complications

## 2013-09-20 NOTE — Progress Notes (Signed)
[facility] PROGRESS NOTE  Edna A Smith-Golden YYT:[id number] DOB: 08-Dec-1965 DOA: 09/14/2013 PCP: Arnoldo Morale, MD  Assessment/Plan:  1-Poorly differentiated Adenocarcinoma Gastric. Antral Ulcer: continue with PPI, PRN Zofran, phenergan. 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 Oncology and surgery consulted.  -HIV non reactive. CEA 0.6.  -diagnostic laparoscopy and distal gastrectomy and possible feeding tube placement planned for today 7/31 per surgery -appreciate Onc input, Dr [name] to arrange for the patient a followup appointment with him at the [facility] after surgical resection for close monitoring of her disease  2-Chest pain:  - related to antral Ulcer.  -Continue with protonix.  - Has had a recent echocardiogram.  -Anemia, follow trend.  -Ct angio 7-24 no evidence of PE.   3- Intractable Vomiting likely related to Antral Ulcers.  -IV fluids.  - CT scan of the abdomen is negative for acute abnormalities, she denies any marijuana use. Abdomen is very soft on exam.  -RUQ US gallbladder sludge.  -Endoscopy: 3 ulcers present in the antrum of the stomach.  -Continue with Protonix.  -Continue with phenergan and Zofran. continue Reglan PRN.   4-Essential hypertension  - coreg change to IV metoprolol.   5-Migraine headache/Small resolving subdural Hematoma -MRI with 56mm subdural, but NEG for Mets - lovenox dc'ed on 7/30 -follow and discuss with neuro for further recs - headaches better.    6-Back pain: could be MSK form vomiting. If persist might need further evaluation, imagine.   7-DVT prophylaxis; Will d/c Lovenox given the subdural, change to SCDs for DVT prophylaxis   8-Hypokalemia;  -Again replete K. and follow  Code Status: full code.  Family Communication: care discussed with Patient.  Disposition Plan: Pending clinical course t.    Consultants:  GI  Oncology  Surgery.   Procedures:  RUQ  [place]; gallbladder sludge.   Antibiotics:  none  HPI/Subjective: staes she had difficulty sleeping last p.m., denies nausea vomiting.awaiting surgery today. Objective: Filed Vitals:   09/20/13 0634  BP: 111/70  Pulse: 68  Temp: 97.8 F (36.6 C)  Resp: 20    Intake/Output Summary (Last 24 hours) at 09/20/13 0911 Last data filed at 09/20/13 0600  Gross per 24 hour  Intake 3597.5 ml  Output      0 ml  Net 3597.5 ml   Filed Weights   09/14/13 2023 09/14/13 2342 09/18/13 0518  Weight: 70.308 kg (155 lb) 69.627 kg (153 lb 8 oz) 70.8 kg (156 lb 1.4 oz)    Exam:   General:  Alert and oriented x3, in no apparent distress   Cardiovascular: S 1, S 2 RRR  Respiratory: CTA  Abdomen: mild tenderness, no rigidity.   Musculoskeletal: no edema. No cyanosis   Data Reviewed: Basic Metabolic Panel:  Recent Labs Lab 09/15/13 0700 09/17/13 0512 09/18/13 1430 09/19/13 0416 09/20/13 0450  NA 137 135* 133* 133* 141  K 3.9 3.4* 3.6* 2.8* 3.1*  CL 101 95* 92* 94* 104  CO2 23 22 23 23 26   GLUCOSE 130* 100* 91 86 139*  BUN 10 7 8 8 8   CREATININE 0.89 0.70 0.74 0.65 0.68  CALCIUM 8.4 8.9 9.1 8.5 8.3*  MG  --   --   --   --  1.9  PHOS  --   --   --   --  2.2*   Liver Function Tests:  Recent Labs Lab 09/13/13 1245 09/14/13 2103 09/20/13 0450  AST 18 18 10   ALT 10 10 8   ALKPHOS  68 70 51  BILITOT 0.5 0.4 0.7  PROT 8.3 8.4* 6.1  ALBUMIN 4.5 4.5 3.3*    Recent Labs Lab 09/13/13 1245 09/14/13 2103  LIPASE 21 26   No results found for this basename: AMMONIA,  in the last 168 hours CBC:  Recent Labs Lab 09/13/13 1245 09/14/13 2103 09/15/13 0700 09/17/13 0512 Sep 30, 2013 0416 09/20/13 0450  WBC 7.5 10.0 7.7 8.3 8.2 7.9  NEUTROABS 6.3  --   --   --   --  4.4  HGB 11.2* 11.6* 9.9* 11.8* 11.8* 10.1*  HCT 34.9* 36.6 30.9* 37.1 36.1 31.3*  MCV 81.9 82.1 81.7 81.7 79.9 81.9  PLT 353 395 328 239 367 289   Cardiac Enzymes:  Recent Labs Lab 09/13/13 1245  09/14/13 2103 09/15/13 0050 09/15/13 0700 09/15/13 1146  TROPONINI <0.30 <0.30 <0.30 <0.30 <0.30   BNP (last 3 results) No results found for this basename: PROBNP,  in the last 8760 hours CBG:  Recent Labs Lab 09/15/13 0937 09/20/13 0018 09/20/13 0609  GLUCAP 135* 116* 126*    Recent Results (from the past 240 hour(s))  URINE CULTURE     Status: None   Collection Time    09/12/13  4:45 PM      Result Value Ref Range Status   Specimen Description URINE, CLEAN CATCH   Final   Special Requests NONE   Final   Culture  Setup Time     Final   Value: 09/13/2013 00:33     Performed at [facility]     Final   Value: NO GROWTH     Performed at [facility]   Culture     Final   Value: NO GROWTH     Performed at [facility]   Report Status 09/13/2013 FINAL   Final  SURGICAL PCR SCREEN     Status: None   Collection Time    09/20/13  3:04 AM      Result Value Ref Range Status   MRSA, PCR NEGATIVE  NEGATIVE Final   Staphylococcus aureus NEGATIVE  NEGATIVE Final   Comment:            The Xpert SA Assay (FDA     approved for NASAL specimens     in patients over 47 years of age),     is one component of     a comprehensive surveillance     program.  Test performance has     been validated by [employer] for patients greater     than or equal to 6 year old.     It is not intended     to diagnose infection nor to     guide or monitor treatment.     Studies: Mr [name] Contrast  09-30-13   CLINICAL DATA:  Headaches. Newly diagnosed gastric cancer. Head injury on 07/18/2013.  EXAM: MRI HEAD WITHOUT AND WITH CONTRAST  TECHNIQUE: Multiplanar, multiecho pulse sequences of the brain and surrounding structures were obtained without and with intravenous contrast.  CONTRAST:  57mL MULTIHANCE GADOBENATE DIMEGLUMINE 529 MG/ML IV SOLN  COMPARISON:  None.  FINDINGS: There is a 6 mm heterogeneously T1 hyperintense subdural hematoma along  the left cerebral convexity. There is mild, diffuse dural enhancement in the left cerebral hemisphere. No right-sided subdural collection or dural thickening/ enhancement is identified.  There is no acute infarct, mass, or midline shift. Brain parenchyma is normal  in signal without abnormal enhancement. Ventricles are normal for age.  Orbits are unremarkable. Major intracranial vascular flow voids are preserved.  IMPRESSION: 1. Small subdural hematoma along the left cerebral convexity without significant mass effect. Smooth dural enhancement throughout the left cerebral hemisphere is most likely reactive, without masslike enhancement seen to suggest metastatic disease. This is most likely related to patient's recent trauma. 2. No evidence of parenchymal brain metastases. Critical Value/emergent results were called by telephone at the time of interpretation on 09/19/2013 at 2:57 pm to Dr. Dillard Essex, who verbally acknowledged these results.   Electronically Signed   By: Logan Bores   On: 09/19/2013 14:57    Scheduled Meds: . cefOXitin  2 g Intravenous On Call to OR  . insulin aspart  0-9 Units Subcutaneous 4 times per day  . metoprolol  5 mg Intravenous Q12H  . pantoprazole (PROTONIX) IV  40 mg Intravenous Q12H  . potassium chloride  10 mEq Intravenous Q1 Hr x 4   Continuous Infusions: . sodium chloride 125 mL/hr at 09/20/13 0607  . Marland KitchenTPN (CLINIMIX-E) Adult 40 mL/hr at 09/19/13 2015   And  . fat emulsion 250 mL (09/19/13 2015)    Principal Problem:   Chest pain Active Problems:   Essential hypertension   Migraine headache   Intractable vomiting   Antral ulcer   Gastric carcinoma   Malnutrition of moderate degree    Time spent: 35 minutes.     [facility] Pager [phone]. If 7PM-7AM, please contact night-coverage at [email or web address], password [facility] 09/20/2013, 9:11 AM  LOS: 6 days

## 2013-09-20 NOTE — Progress Notes (Signed)
Rec'd pt from PACU condition stable. Midline surgical dressing intact with shadowing. JP intact, feeding tube in place. Pt lethargic but arousable  Explained to family not to push PCA button for patient. Explained use of PCA to pt again as well. Verbalized understanding. Pt positioned in bed resting quietly with eyes closed.  Cont with plan of care

## 2013-09-21 LAB — CBC
HCT: 34.2 % — ABNORMAL LOW (ref 36.0–46.0)
Hemoglobin: 10.7 g/dL — ABNORMAL LOW (ref 12.0–15.0)
MCH: 26.2 pg (ref 26.0–34.0)
MCHC: 31.3 g/dL (ref 30.0–36.0)
MCV: 83.8 fL (ref 78.0–100.0)
Platelets: 281 10*3/uL (ref 150–400)
RBC: 4.08 MIL/uL (ref 3.87–5.11)
RDW: 15 % (ref 11.5–15.5)
WBC: 14.2 10*3/uL — AB (ref 4.0–10.5)

## 2013-09-21 LAB — GLUCOSE, CAPILLARY
GLUCOSE-CAPILLARY: 218 mg/dL — AB (ref 70–99)
Glucose-Capillary: 145 mg/dL — ABNORMAL HIGH (ref 70–99)
Glucose-Capillary: 133 mg/dL — ABNORMAL HIGH (ref 70–99)
Glucose-Capillary: 120 mg/dL — ABNORMAL HIGH (ref 70–99)

## 2013-09-21 LAB — BASIC METABOLIC PANEL
Anion gap: 8 (ref 5–15)
BUN: 10 mg/dL (ref 6–23)
CO2: 29 meq/L (ref 19–32)
CREATININE: 0.64 mg/dL (ref 0.50–1.10)
Calcium: 8.6 mg/dL (ref 8.4–10.5)
Chloride: 100 mEq/L (ref 96–112)
GFR calc Af Amer: >90 mL/min (ref >90)
GLUCOSE: 158 mg/dL — AB (ref 70–99)
Potassium: 4 mEq/L (ref 3.7–5.3)
SODIUM: 137 meq/L (ref 137–147)

## 2013-09-21 LAB — MAGNESIUM: Magnesium: 1.8 mg/dL (ref 1.5–2.5)

## 2013-09-21 LAB — PHOSPHORUS: Phosphorus: 2.8 mg/dL (ref 2.3–4.6)

## 2013-09-21 MED ORDER — FAT EMULSION 20 % IV EMUL
250.0000 mL | INTRAVENOUS | Status: AC
  Administered 2013-09-21: 250 mL via INTRAVENOUS
  Filled 2013-09-21: qty 250

## 2013-09-21 MED ORDER — TRACE MINERALS CR-CU-F-FE-I-MN-MO-SE-ZN IV SOLN
INTRAVENOUS | Status: AC
  Administered 2013-09-21: 18:00:00 via INTRAVENOUS
  Filled 2013-09-21: qty 2000

## 2013-09-21 MED ORDER — SODIUM CHLORIDE 0.9 % IJ SOLN: 9.0000 mL | INTRAMUSCULAR | Status: DC | PRN

## 2013-09-21 MED ORDER — SODIUM CHLORIDE 0.9 % IV SOLN
INTRAVENOUS | Status: AC
  Administered 2013-09-21 – 2013-10-08 (×12): via INTRAVENOUS
  Administered 2013-10-09: 1000 mL via INTRAVENOUS
  Administered 2013-10-10 – 2013-10-11 (×2): via INTRAVENOUS

## 2013-09-21 MED ORDER — VITAMINS A & D EX OINT
TOPICAL_OINTMENT | CUTANEOUS | Status: AC
  Administered 2013-09-21: 14:00:00
  Filled 2013-09-21: qty 5

## 2013-09-21 MED ORDER — DIPHENHYDRAMINE HCL 50 MG/ML IJ SOLN: 12.5000 mg | Freq: Four times a day (QID) | INTRAMUSCULAR | Status: DC | PRN

## 2013-09-21 MED ORDER — ONDANSETRON HCL 4 MG/2ML IJ SOLN
4.0000 mg | Freq: Four times a day (QID) | INTRAMUSCULAR | Status: DC | PRN
  Administered 2013-09-21: 4 mg via INTRAVENOUS
  Filled 2013-09-21: qty 2

## 2013-09-21 MED ORDER — ONDANSETRON HCL 4 MG/2ML IJ SOLN: 4.0000 mg | Freq: Four times a day (QID) | INTRAMUSCULAR | Status: DC | PRN

## 2013-09-21 MED ORDER — DIPHENHYDRAMINE HCL 12.5 MG/5ML PO ELIX: 12.5000 mg | ORAL_SOLUTION | Freq: Four times a day (QID) | ORAL | Status: DC | PRN

## 2013-09-21 MED ORDER — VITAMINS A & D EX OINT
TOPICAL_OINTMENT | CUTANEOUS | Status: AC
  Administered 2013-09-21: 02:00:00
  Filled 2013-09-21: qty 5

## 2013-09-21 MED ORDER — HYDROMORPHONE 0.3 MG/ML IV SOLN
INTRAVENOUS | Status: DC
  Administered 2013-09-21: 1.2 mg via INTRAVENOUS
  Administered 2013-09-21: 1.5 mg via INTRAVENOUS
  Administered 2013-09-21: 2.1 mg via INTRAVENOUS
  Administered 2013-09-21 – 2013-09-22 (×2): via INTRAVENOUS
  Administered 2013-09-22: 2.5 mg via INTRAVENOUS
  Administered 2013-09-22: 3.35 mg via INTRAVENOUS
  Administered 2013-09-22 (×2): 2.1 mg via INTRAVENOUS
  Administered 2013-09-22: 1.08 mg via INTRAVENOUS
  Administered 2013-09-22: 13:00:00 via INTRAVENOUS
  Administered 2013-09-23: 3.18 mg via INTRAVENOUS
  Administered 2013-09-23: via INTRAVENOUS
  Administered 2013-09-23: 2.7 mg via INTRAVENOUS
  Administered 2013-09-23: 0.9 mg via INTRAVENOUS
  Filled 2013-09-21 (×4): qty 25

## 2013-09-21 MED ORDER — HYDROXYZINE HCL 25 MG PO TABS
25.0000 mg | ORAL_TABLET | Freq: Four times a day (QID) | ORAL | Status: DC | PRN
  Administered 2013-09-21 – 2013-09-23 (×2): 25 mg via ORAL
  Filled 2013-09-21 (×2): qty 1

## 2013-09-21 MED ORDER — HYDROMORPHONE 0.3 MG/ML IV SOLN: INTRAVENOUS | Status: DC

## 2013-09-21 MED ORDER — DIPHENHYDRAMINE HCL 12.5 MG/5ML PO ELIX
12.5000 mg | ORAL_SOLUTION | Freq: Four times a day (QID) | ORAL | Status: DC | PRN
  Administered 2013-09-22 (×3): 12.5 mg via ORAL
  Filled 2013-09-21 (×3): qty 5

## 2013-09-21 MED ORDER — NALOXONE HCL 0.4 MG/ML IJ SOLN: 0.4000 mg | INTRAMUSCULAR | Status: DC | PRN

## 2013-09-21 NOTE — Progress Notes (Signed)
Pt stood to side of bed to help with bath, HR up to 140s, quickly back to normal with rest.

## 2013-09-21 NOTE — Progress Notes (Signed)
PARENTERAL NUTRITION CONSULT NOTE - Follow Up  Pharmacy Consult for TNA Indication:  Adenocarcinoma -gastric, unable to tolerate PO intake  No Known Allergies  Patient Measurements: Height: 5\' 5"  (165.1 cm) Weight: 156 lb 1.4 oz (70.8 kg) IBW/kg (Calculated) : 57 Adjusted Body Weight: 62 3-7 lb weight loss per RD notes  Vital Signs: Temp: 98 F (36.7 C) (08/01 2878) Temp src: Oral (08/01 0633) BP: 145/93 mmHg (08/01 6767) Pulse Rate: 97 (08/01 0633) Intake/Output from previous day: 07/31 0701 - 08/01 0700 In: 2350 [I.V.:2000; IV Piggyback:350] Out: 2950 [Urine:2600; Drains:150; Blood:200] Intake/Output from this shift: Total I/O In: -  Out: 610 [Urine:600; Drains:10]  Labs:  Recent Labs  09/19/13 0416 09/20/13 0450 09/21/13 0500  WBC 8.2 7.9 14.2*  HGB 11.8* 10.1* 10.7*  HCT 36.1 31.3* 34.2*  PLT 367 289 281     Recent Labs  09/18/13 1430 09/19/13 0416 09/20/13 0450 09/21/13 0500  NA 133* 133* 141 137  K 3.6* 2.8* 3.1* 4.0  CL 92* 94* 104 100  CO2 23 23 26 29   GLUCOSE 91 86 139* 158*  BUN 8 8 8 10   CREATININE 0.74 0.65 0.68 0.64  CALCIUM 9.1 8.5 8.3* 8.6  MG  --   --  1.9 1.8  PHOS  --   --  2.2* 2.8  PROT  --   --  6.1  --   ALBUMIN  --   --  3.3*  --   AST  --   --  10  --   ALT  --   --  8  --   ALKPHOS  --   --  51  --   BILITOT  --   --  0.7  --   PREALBUMIN 17.3*  --  12.7*  --   TRIG  --   --  101  --    Estimated Creatinine Clearance: 86.7 ml/min (by C-G formula based on Cr of 0.64).    Recent Labs  09/20/13 1620 09/21/13 0053 09/21/13 0521  GLUCAP 196* 218* 145*    Medical History: Past Medical History  Diagnosis Date  . Headache(784.0) 2011    Migraines  . Esophageal reflux     On omeprazole  . Costochondritis   . Hypertension     started around 2011  . Ovarian cyst     Medications:  Scheduled:  . carvedilol  6.25 mg Oral BID WC  . enoxaparin (LOVENOX) injection  40 mg Subcutaneous Q24H  . HYDROmorphone PCA 0.3  mg/mL   Intravenous 6 times per day  . insulin aspart  0-9 Units Subcutaneous 4 times per day  . pantoprazole (PROTONIX) IV  40 mg Intravenous Q12H   Infusions:  . sodium chloride 55 mL/hr at 09/20/13 1908  . bupivacaine ON-Q pain pump    . Marland KitchenTPN (CLINIMIX-E) Adult 60 mL/hr at 09/20/13 2027   And  . fat emulsion 250 mL (09/20/13 2027)    Insulin Requirements in the past 24 hours:  4 unit, sens SSI  Current Nutrition:  NPO  Nutritional Goals:   Per RD assessment, 1800-2000 kCal,  85-95 grams of protein per day  Clinimix 5/15 at goal rate of 80 ml/hr + lipid to provide 96g protein and 1843 kcal/day  IVF:  NS @ 55 ml/hr  Lines:  PICC placed 7/30  Assessment: 48 yo female with newly diagnosed poorly differentiated gastric adenocarcinoma with plan per [facility] for massive bowel resection scheduled for 7/31. To place PICC line and go  ahead and start TNA per pharmacy dosing this evening if possible.  8/1: D#3 TNA.  POD1 for distal gastrectomy, feeding GJT placement.  Ileus continues post-op.   Lytes: WNL, Phos and Mg also WNL.  Renal: stable, CrCl 87 ml/min Hepatic: WNL CBGs: 122-196, received 1 dose dexamethasone 10mg  7/31 TGs: 101 (7/31) Prealb: 12.7 (7/31)  Plan:  At 1800 tonight: Increase Clinimix E5/15 to goal rate of 80 ml/hr and continue lipids at rate of 10 ml/hr Continue q6h CBG/sensitive SSI Mon/Thurs TNA labs Decrease IV fluids to 35 ml/hr at 6pm  Ralene Bathe, PharmD, BCPS 09/21/2013, 11:17 AM  Pager: [phone]
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
## 2013-09-21 NOTE — Progress Notes (Signed)
I stopped the morphine PCA since patient complained that it is not doing anything for her pain. She prefers the dilaudid IV bolus which gives good effect on her surgical pain.

## 2013-09-21 NOTE — Progress Notes (Signed)
Patient ID: Paula [name], female   DOB: 01-Dec-1965, 48 y.o.   MRN: [id number] [facility] Progress Note:   1 Day Post-Op  Subjective: Mental status is clear Objective: Vital signs in last 24 hours: Temp:  [97.6 F (36.4 C)-99 F (37.2 C)] 98 F (36.7 C) (08/01 5993) Pulse Rate:  [82-102] 97 (08/01 0633) Resp:  [6-19] 17 (08/01 0633) BP: (134-172)/(84-94) 145/93 mmHg (08/01 0633) SpO2:  [92 %-100 %] 100 % (08/01 5701)  Intake/Output from previous day: 07/31 0701 - 08/01 0700 In: 2350 [I.V.:2000; IV Piggyback:350] Out: 2950 [Urine:2600; Drains:150; Blood:200] Intake/Output this shift: Total I/O In: -  Out: 60 [Urine:600; Drains:10]  Physical Exam: Work of breathing is not labored at rest;  Incision covered.  Foley in place; pt not out of bed yet.    Lab Results:  Results for orders placed during the hospital encounter of 09/14/13 (from the past 48 hour(s))  GLUCOSE, CAPILLARY     Status: Abnormal   Collection Time    09/20/13 12:18 AM      Result Value Ref Range   Glucose-Capillary 116 (*) 70 - 99 mg/dL   Comment 1 Documented in Chart     Comment 2 Notify RN    SURGICAL PCR SCREEN     Status: None   Collection Time    09/20/13  3:04 AM      Result Value Ref Range   MRSA, PCR NEGATIVE  NEGATIVE   Staphylococcus aureus NEGATIVE  NEGATIVE   Comment:            The Xpert SA Assay (FDA     approved for NASAL specimens     in patients over 31 years of age),     is one component of     a comprehensive surveillance     program.  Test performance has     been validated by [employer] for patients greater     than or equal to 80 year old.     It is not intended     to diagnose infection nor to     guide or monitor treatment.  COMPREHENSIVE METABOLIC PANEL     Status: Abnormal   Collection Time    09/20/13  4:50 AM      Result Value Ref Range   Sodium 141  137 - 147 mEq/L   Comment: DELTA CHECK NOTED   Potassium 3.1 (*) 3.7 - 5.3 mEq/L    Chloride 104  96 - 112 mEq/L   Comment: DELTA CHECK NOTED   CO2 26  19 - 32 mEq/L   Glucose, Bld 139 (*) 70 - 99 mg/dL   BUN 8  6 - 23 mg/dL   Creatinine, Ser 0.68  0.50 - 1.10 mg/dL   Calcium 8.3 (*) 8.4 - 10.5 mg/dL   Total Protein 6.1  6.0 - 8.3 g/dL   Albumin 3.3 (*) 3.5 - 5.2 g/dL   AST 10  0 - 37 U/L   ALT 8  0 - 35 U/L   Alkaline Phosphatase 51  39 - 117 U/L   Total Bilirubin 0.7  0.3 - 1.2 mg/dL   GFR calc non Af Amer >90  >90 mL/min   GFR calc Af Amer >90  >90 mL/min   Comment: (NOTE)     The eGFR has been calculated using the CKD EPI equation.     This calculation has not been validated in all clinical situations.  eGFR's persistently <90 mL/min signify possible Chronic Kidney     Disease.   Anion gap 11  5 - 15  PREALBUMIN     Status: Abnormal   Collection Time    09/20/13  4:50 AM      Result Value Ref Range   Prealbumin 12.7 (*) 17.0 - 34.0 mg/dL   Comment: Performed at [facility]     Status: None   Collection Time    09/20/13  4:50 AM      Result Value Ref Range   Magnesium 1.9  1.5 - 2.5 mg/dL  PHOSPHORUS     Status: Abnormal   Collection Time    09/20/13  4:50 AM      Result Value Ref Range   Phosphorus 2.2 (*) 2.3 - 4.6 mg/dL  TRIGLYCERIDES     Status: None   Collection Time    09/20/13  4:50 AM      Result Value Ref Range   Triglycerides 101  <150 mg/dL   Comment: Performed at [facility]  CBC     Status: Abnormal   Collection Time    09/20/13  4:50 AM      Result Value Ref Range   WBC 7.9  4.0 - 10.5 K/uL   RBC 3.82 (*) 3.87 - 5.11 MIL/uL   Hemoglobin 10.1 (*) 12.0 - 15.0 g/dL   HCT 31.3 (*) 36.0 - 46.0 %   MCV 81.9  78.0 - 100.0 fL   MCH 26.4  26.0 - 34.0 pg   MCHC 32.3  30.0 - 36.0 g/dL   RDW 14.6  11.5 - 15.5 %   Platelets 289  150 - 400 K/uL  DIFFERENTIAL     Status: None   Collection Time    09/20/13  4:50 AM      Result Value Ref Range   Neutrophils Relative % 56  43 - 77 %   Neutro Abs 4.4  1.7 -  7.7 K/uL   Lymphocytes Relative 33  12 - 46 %   Lymphs Abs 2.6  0.7 - 4.0 K/uL   Monocytes Relative 10  3 - 12 %   Monocytes Absolute 0.8  0.1 - 1.0 K/uL   Eosinophils Relative 1  0 - 5 %   Eosinophils Absolute 0.1  0.0 - 0.7 K/uL   Basophils Relative 0  0 - 1 %   Basophils Absolute 0.0  0.0 - 0.1 K/uL  GLUCOSE, CAPILLARY     Status: Abnormal   Collection Time    09/20/13  6:09 AM      Result Value Ref Range   Glucose-Capillary 126 (*) 70 - 99 mg/dL  GLUCOSE, CAPILLARY     Status: Abnormal   Collection Time    09/20/13 11:29 AM      Result Value Ref Range   Glucose-Capillary 122 (*) 70 - 99 mg/dL  GLUCOSE, CAPILLARY     Status: Abnormal   Collection Time    09/20/13  4:20 PM      Result Value Ref Range   Glucose-Capillary 196 (*) 70 - 99 mg/dL   Comment 1 Documented in Chart     Comment 2 Notify RN    GLUCOSE, CAPILLARY     Status: Abnormal   Collection Time    09/21/13 12:53 AM      Result Value Ref Range   Glucose-Capillary 218 (*) 70 - 99 mg/dL  BASIC METABOLIC PANEL     Status: Abnormal  Collection Time    09/21/13  5:00 AM      Result Value Ref Range   Sodium 137  137 - 147 mEq/L   Potassium 4.0  3.7 - 5.3 mEq/L   Comment: REPEATED TO VERIFY     DELTA CHECK NOTED     NO VISIBLE HEMOLYSIS     HEMOLYSIS AT THIS LEVEL MAY AFFECT RESULT   Chloride 100  96 - 112 mEq/L   CO2 29  19 - 32 mEq/L   Glucose, Bld 158 (*) 70 - 99 mg/dL   BUN 10  6 - 23 mg/dL   Creatinine, Ser 0.64  0.50 - 1.10 mg/dL   Calcium 8.6  8.4 - 10.5 mg/dL   GFR calc non Af Amer >90  >90 mL/min   GFR calc Af Amer >90  >90 mL/min   Comment: (NOTE)     The eGFR has been calculated using the CKD EPI equation.     This calculation has not been validated in all clinical situations.     eGFR's persistently <90 mL/min signify possible Chronic Kidney     Disease.   Anion gap 8  5 - 15  PHOSPHORUS     Status: None   Collection Time    09/21/13  5:00 AM      Result Value Ref Range   Phosphorus 2.8   2.3 - 4.6 mg/dL  MAGNESIUM     Status: None   Collection Time    09/21/13  5:00 AM      Result Value Ref Range   Magnesium 1.8  1.5 - 2.5 mg/dL  CBC     Status: Abnormal   Collection Time    09/21/13  5:00 AM      Result Value Ref Range   WBC 14.2 (*) 4.0 - 10.5 K/uL   RBC 4.08  3.87 - 5.11 MIL/uL   Hemoglobin 10.7 (*) 12.0 - 15.0 g/dL   HCT 34.2 (*) 36.0 - 46.0 %   MCV 83.8  78.0 - 100.0 fL   MCH 26.2  26.0 - 34.0 pg   MCHC 31.3  30.0 - 36.0 g/dL   RDW 15.0  11.5 - 15.5 %   Platelets 281  150 - 400 K/uL  GLUCOSE, CAPILLARY     Status: Abnormal   Collection Time    09/21/13  5:21 AM      Result Value Ref Range   Glucose-Capillary 145 (*) 70 - 99 mg/dL    Radiology/Results: Mr [name] Contrast  09/19/2013   CLINICAL DATA:  Headaches. Newly diagnosed gastric cancer. Head injury on 07/18/2013.  EXAM: MRI HEAD WITHOUT AND WITH CONTRAST  TECHNIQUE: Multiplanar, multiecho pulse sequences of the brain and surrounding structures were obtained without and with intravenous contrast.  CONTRAST:  40m MULTIHANCE GADOBENATE DIMEGLUMINE 529 MG/ML IV SOLN  COMPARISON:  None.  FINDINGS: There is a 6 mm heterogeneously T1 hyperintense subdural hematoma along the left cerebral convexity. There is mild, diffuse dural enhancement in the left cerebral hemisphere. No right-sided subdural collection or dural thickening/ enhancement is identified.  There is no acute infarct, mass, or midline shift. Brain parenchyma is normal in signal without abnormal enhancement. Ventricles are normal for age.  Orbits are unremarkable. Major intracranial vascular flow voids are preserved.  IMPRESSION: 1. Small subdural hematoma along the left cerebral convexity without significant mass effect. Smooth dural enhancement throughout the left cerebral hemisphere is most likely reactive, without masslike enhancement seen to suggest metastatic disease. This  is most likely related to patient's recent trauma. 2. No evidence of  parenchymal brain metastases. Critical Value/emergent results were called by telephone at the time of interpretation on 09/19/2013 at 2:57 pm to Dr. Dillard Essex, who verbally acknowledged these results.   Electronically Signed   By: Logan Bores   On: 09/19/2013 14:57    Anti-infectives: Anti-infectives   Start     Dose/Rate Route Frequency Ordered Stop   09/20/13 1915  cefOXitin (MEFOXIN) 1 g in dextrose 5 % 50 mL IVPB     1 g 100 mL/hr over 30 Minutes Intravenous Every 6 hours 09/20/13 1903 09/21/13 0703   09/20/13 0600  [MAR Hold]  cefOXitin (MEFOXIN) 2 g in dextrose 5 % 50 mL IVPB     (On MAR Hold since 09/20/13 1215)  Comments:  Pharmacy may adjust dosing strength, interval, or rate of medication as needed for optimal therapy for the patient Send with patient on call to the OR.  Anesthesia to complete antibiotic administration <10mn [facility]   2 g 100 mL/hr over 30 Minutes Intravenous On call to O.R. 09/19/13 1119 09/20/13 1315      Assessment/Plan: Problem List: Patient Active Problem List   Diagnosis Date Noted  . Malnutrition of moderate degree 09/19/2013  . 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 Antral ulcer 09/16/2013  . Migraine headache 09/14/2013  . Intractable vomiting 09/14/2013  . PTSD (post-traumatic stress disorder) 08/19/2013  . Left knee injury 07/30/2013  . Low back pain 07/30/2013  . Mitral regurgitation 02/15/2013  . Chest pain 01/23/2012  . Essential hypertension 01/23/2012  . Murmur 01/23/2012    Requested dilaudid PCA for pain control.  Still wilth ileus from surgery.   1 Day Post-Op    LOS: 7 days   Matt B. MHassell Done MD, FAvera [facility] Hospital[facility], [facility] [phone] [phone] 09/21/2013 9:06 AM

## 2013-09-22 LAB — CBC
HEMATOCRIT: 28.8 % — AB (ref 36.0–46.0)
Hemoglobin: 9 g/dL — ABNORMAL LOW (ref 12.0–15.0)
MCH: 26.1 pg (ref 26.0–34.0)
MCHC: 31.3 g/dL (ref 30.0–36.0)
MCV: 83.5 fL (ref 78.0–100.0)
Platelets: 230 10*3/uL (ref 150–400)
RBC: 3.45 MIL/uL — ABNORMAL LOW (ref 3.87–5.11)
RDW: 15.2 % (ref 11.5–15.5)
WBC: 12.1 10*3/uL — ABNORMAL HIGH (ref 4.0–10.5)

## 2013-09-22 LAB — BASIC METABOLIC PANEL
Anion gap: 8 (ref 5–15)
BUN: 13 mg/dL (ref 6–23)
CO2: 32 mEq/L (ref 19–32)
CREATININE: 0.65 mg/dL (ref 0.50–1.10)
Calcium: 8.2 mg/dL — ABNORMAL LOW (ref 8.4–10.5)
Chloride: 96 mEq/L (ref 96–112)
GFR calc Af Amer: >90 mL/min (ref >90)
Glucose, Bld: 136 mg/dL — ABNORMAL HIGH (ref 70–99)
POTASSIUM: 3.4 meq/L — AB (ref 3.7–5.3)
Sodium: 136 mEq/L — ABNORMAL LOW (ref 137–147)

## 2013-09-22 LAB — GLUCOSE, CAPILLARY
GLUCOSE-CAPILLARY: 103 mg/dL — AB (ref 70–99)
GLUCOSE-CAPILLARY: 88 mg/dL (ref 70–99)
Glucose-Capillary: 144 mg/dL — ABNORMAL HIGH (ref 70–99)
Glucose-Capillary: 126 mg/dL — ABNORMAL HIGH (ref 70–99)

## 2013-09-22 MED ORDER — TRACE MINERALS CR-CU-F-FE-I-MN-MO-SE-ZN IV SOLN
INTRAVENOUS | Status: AC
  Administered 2013-09-22: 18:00:00 via INTRAVENOUS
  Filled 2013-09-22: qty 2000

## 2013-09-22 MED ORDER — POTASSIUM CHLORIDE CRYS ER 20 MEQ PO TBCR
40.0000 meq | EXTENDED_RELEASE_TABLET | Freq: Once | ORAL | Status: AC
  Administered 2013-09-22: 40 meq via ORAL
  Filled 2013-09-22 (×2): qty 2

## 2013-09-22 MED ORDER — FAT EMULSION 20 % IV EMUL
250.0000 mL | INTRAVENOUS | Status: AC
  Administered 2013-09-22: 250 mL via INTRAVENOUS
  Filled 2013-09-22: qty 250

## 2013-09-22 MED ORDER — ADULT MULTIVITAMIN W/MINERALS CH
1.0000 | ORAL_TABLET | Freq: Every day | ORAL | Status: DC
  Administered 2013-09-23 – 2013-10-01 (×6): 1 via ORAL
  Filled 2013-09-22 (×9): qty 1

## 2013-09-22 NOTE — Progress Notes (Signed)
PARENTERAL NUTRITION CONSULT NOTE - Follow Up  Pharmacy Consult for TNA Indication:  Adenocarcinoma -gastric, unable to tolerate PO intake  No Known Allergies  Patient Measurements: Height: 5\' 5"  (165.1 cm) Weight: 156 lb 1.4 oz (70.8 kg) IBW/kg (Calculated) : 57 Adjusted Body Weight: 62 3-7 lb weight loss per RD notes  Vital Signs: Temp: 98.9 F (37.2 C) (08/02 0710) Temp src: Oral (08/02 0710) BP: 114/72 mmHg (08/02 0710) Pulse Rate: 108 (08/02 0710) Intake/Output from previous day: 08/01 0701 - 08/02 0700 In: 2864.1 [I.V.:1019.1; TPN:1845] Out: 1880 [Urine:1800; Drains:80] Intake/Output from this shift: Total I/O In: -  Out: 300 [Urine:300]  Labs:  Recent Labs  09/20/13 0450 09/21/13 0500 09/22/13 0345  WBC 7.9 14.2* 12.1*  HGB 10.1* 10.7* 9.0*  HCT 31.3* 34.2* 28.8*  PLT 289 281 230     Recent Labs  09/20/13 0450 09/21/13 0500 09/22/13 0345  NA 141 137 136*  K 3.1* 4.0 3.4*  CL 104 100 96  CO2 26 29 32  GLUCOSE 139* 158* 136*  BUN 8 10 13   CREATININE 0.68 0.64 0.65  CALCIUM 8.3* 8.6 8.2*  MG 1.9 1.8  --   PHOS 2.2* 2.8  --   PROT 6.1  --   --   ALBUMIN 3.3*  --   --   AST 10  --   --   ALT 8  --   --   ALKPHOS 51  --   --   BILITOT 0.7  --   --   PREALBUMIN 12.7*  --   --   TRIG 101  --   --    Estimated Creatinine Clearance: 86.7 ml/min (by C-G formula based on Cr of 0.65).    Recent Labs  09/21/13 1819 09/22/13 0004 09/22/13 0649  GLUCAP 120* 103* 144*    Medical History: Past Medical History  Diagnosis Date  . Headache(784.0) 2011    Migraines  . Esophageal reflux     On omeprazole  . Costochondritis   . Hypertension     started around 2011  . Ovarian cyst     Medications:  Scheduled:  . carvedilol  6.25 mg Oral BID WC  . enoxaparin (LOVENOX) injection  40 mg Subcutaneous Q24H  . HYDROmorphone PCA 0.3 mg/mL   Intravenous 6 times per day  . insulin aspart  0-9 Units Subcutaneous 4 times per day  . pantoprazole  (PROTONIX) IV  40 mg Intravenous Q12H   Infusions:  . sodium chloride 35 mL/hr at 09/21/13 1825  . bupivacaine ON-Q pain pump    . Marland KitchenTPN (CLINIMIX-E) Adult 80 mL/hr at 09/21/13 1733   And  . fat emulsion 250 mL (09/21/13 1734)    Insulin Requirements in the past 24 hours:  2 unit, sens SSI  Current Nutrition:  NPO  Nutritional Goals:   Per RD assessment, 1800-2000 kCal,  85-95 grams of protein per day  Clinimix 5/15 at goal rate of 80 ml/hr + lipid to provide 96g protein and 1843 kcal/day  IVF:  NS @ 35 ml/hr  Lines:  PICC placed 7/30  Assessment: 48 yo female with newly diagnosed poorly differentiated gastric adenocarcinoma with plan per [facility] for massive bowel resection scheduled for 7/31. To place PICC line and go ahead and start TNA per pharmacy dosing this evening if possible.  8/2: D#4 TNA.  POD2 for distal gastrectomy, feeding GJT placement.  Ileus continues post-op.   Lytes: K and Na slightly low, Phos and Mg WNL (8/1). Unable  to adjust Na in premix clinimix.   Renal: stable, CrCl 87 ml/min Hepatic: WNL CBGs: 103-144 TGs: 101 (7/31) Prealb: 12.7 (7/31)  Plan:  At 1800 tonight: Continue Clinimix E5/15 at goal rate of 80 ml/hr and continue lipids at rate of 10 ml/hr with trace elements daily Change MVI to PO daily (first dose 8/3) Continue q6h CBG/sensitive SSI  Mon/Thurs TNA labs Continue IV fluids at 35 ml/hr Replace potassium with 64mEq PO x 1 F/u daily  Ralene Bathe, PharmD, BCPS 09/22/2013, 8:26 AM  Pager: [phone]

## 2013-09-22 NOTE — Progress Notes (Signed)
Patient ID: Paula [name], female   DOB: 02-21-1966, 48 y.o.   MRN: [id number] [facility] Progress Note:   2 Days Post-Op  Subjective: Mental status is clear.  Up in bathroom cleaning and grooming.   Objective: Vital signs in last 24 hours: Temp:  [97.7 F (36.5 C)-98.9 F (37.2 C)] 98.9 F (37.2 C) (08/02 0710) Pulse Rate:  [92-108] 108 (08/02 0710) Resp:  [11-21] 16 (08/02 0753) BP: (114-140)/(72-82) 114/72 mmHg (08/02 0710) SpO2:  [100 %] 100 % (08/02 0753)  Intake/Output from previous day: 08/01 0701 - 08/02 0700 In: 2864.1 [I.V.:1019.1; TPN:1845] Out: 1880 [Urine:1800; Drains:80] Intake/Output this shift: Total I/O In: -  Out: 300 [Urine:300]  Physical Exam: Work of breathing is normal.  VSS  Minimal pain  Lab Results:  Results for orders placed during the hospital encounter of 09/14/13 (from the past 48 hour(s))  GLUCOSE, CAPILLARY     Status: Abnormal   Collection Time    09/20/13 11:29 AM      Result Value Ref Range   Glucose-Capillary 122 (*) 70 - 99 mg/dL  GLUCOSE, CAPILLARY     Status: Abnormal   Collection Time    09/20/13  4:20 PM      Result Value Ref Range   Glucose-Capillary 196 (*) 70 - 99 mg/dL   Comment 1 Documented in Chart     Comment 2 Notify RN    GLUCOSE, CAPILLARY     Status: Abnormal   Collection Time    09/21/13 12:53 AM      Result Value Ref Range   Glucose-Capillary 218 (*) 70 - 99 mg/dL  BASIC METABOLIC PANEL     Status: Abnormal   Collection Time    09/21/13  5:00 AM      Result Value Ref Range   Sodium 137  137 - 147 mEq/L   Potassium 4.0  3.7 - 5.3 mEq/L   Comment: REPEATED TO VERIFY     DELTA CHECK NOTED     NO VISIBLE HEMOLYSIS     HEMOLYSIS AT THIS LEVEL MAY AFFECT RESULT   Chloride 100  96 - 112 mEq/L   CO2 29  19 - 32 mEq/L   Glucose, Bld 158 (*) 70 - 99 mg/dL   BUN 10  6 - 23 mg/dL   Creatinine, Ser 0.64  0.50 - 1.10 mg/dL   Calcium 8.6  8.4 - 10.5 mg/dL   GFR calc non Af Amer >90  >90 mL/min   GFR calc Af Amer >90  >90 mL/min   Comment: (NOTE)     The eGFR has been calculated using the CKD EPI equation.     This calculation has not been validated in all clinical situations.     eGFR's persistently <90 mL/min signify possible Chronic Kidney     Disease.   Anion gap 8  5 - 15  PHOSPHORUS     Status: None   Collection Time    09/21/13  5:00 AM      Result Value Ref Range   Phosphorus 2.8  2.3 - 4.6 mg/dL  MAGNESIUM     Status: None   Collection Time    09/21/13  5:00 AM      Result Value Ref Range   Magnesium 1.8  1.5 - 2.5 mg/dL  CBC     Status: Abnormal   Collection Time    09/21/13  5:00 AM      Result Value Ref Range   WBC 14.2 (*)  4.0 - 10.5 K/uL   RBC 4.08  3.87 - 5.11 MIL/uL   Hemoglobin 10.7 (*) 12.0 - 15.0 g/dL   HCT 34.2 (*) 36.0 - 46.0 %   MCV 83.8  78.0 - 100.0 fL   MCH 26.2  26.0 - 34.0 pg   MCHC 31.3  30.0 - 36.0 g/dL   RDW 15.0  11.5 - 15.5 %   Platelets 281  150 - 400 K/uL  GLUCOSE, CAPILLARY     Status: Abnormal   Collection Time    09/21/13  5:21 AM      Result Value Ref Range   Glucose-Capillary 145 (*) 70 - 99 mg/dL  GLUCOSE, CAPILLARY     Status: Abnormal   Collection Time    09/21/13 11:50 AM      Result Value Ref Range   Glucose-Capillary 133 (*) 70 - 99 mg/dL  GLUCOSE, CAPILLARY     Status: Abnormal   Collection Time    09/21/13  6:19 PM      Result Value Ref Range   Glucose-Capillary 120 (*) 70 - 99 mg/dL  GLUCOSE, CAPILLARY     Status: Abnormal   Collection Time    09/22/13 12:04 AM      Result Value Ref Range   Glucose-Capillary 103 (*) 70 - 99 mg/dL   Comment 1 Documented in Chart     Comment 2 Notify RN    CBC     Status: Abnormal   Collection Time    09/22/13  3:45 AM      Result Value Ref Range   WBC 12.1 (*) 4.0 - 10.5 K/uL   RBC 3.45 (*) 3.87 - 5.11 MIL/uL   Hemoglobin 9.0 (*) 12.0 - 15.0 g/dL   HCT 28.8 (*) 36.0 - 46.0 %   MCV 83.5  78.0 - 100.0 fL   MCH 26.1  26.0 - 34.0 pg   MCHC 31.3  30.0 - 36.0 g/dL   RDW  15.2  11.5 - 15.5 %   Platelets 230  150 - 400 K/uL  BASIC METABOLIC PANEL     Status: Abnormal   Collection Time    09/22/13  3:45 AM      Result Value Ref Range   Sodium 136 (*) 137 - 147 mEq/L   Potassium 3.4 (*) 3.7 - 5.3 mEq/L   Chloride 96  96 - 112 mEq/L   CO2 32  19 - 32 mEq/L   Glucose, Bld 136 (*) 70 - 99 mg/dL   BUN 13  6 - 23 mg/dL   Creatinine, Ser 0.65  0.50 - 1.10 mg/dL   Calcium 8.2 (*) 8.4 - 10.5 mg/dL   GFR calc non Af Amer >90  >90 mL/min   GFR calc Af Amer >90  >90 mL/min   Comment: (NOTE)     The eGFR has been calculated using the CKD EPI equation.     This calculation has not been validated in all clinical situations.     eGFR's persistently <90 mL/min signify possible Chronic Kidney     Disease.   Anion gap 8  5 - 15  GLUCOSE, CAPILLARY     Status: Abnormal   Collection Time    09/22/13  6:49 AM      Result Value Ref Range   Glucose-Capillary 144 (*) 70 - 99 mg/dL   Comment 1 Documented in Chart     Comment 2 Notify RN      Radiology/Results: No results found.  Anti-infectives:  Anti-infectives   Start     Dose/Rate Route Frequency Ordered Stop   09/20/13 1915  cefOXitin (MEFOXIN) 1 g in dextrose 5 % 50 mL IVPB     1 g 100 mL/hr over 30 Minutes Intravenous Every 6 hours 09/20/13 1903 09/21/13 0703   09/20/13 0600  [MAR Hold]  cefOXitin (MEFOXIN) 2 g in dextrose 5 % 50 mL IVPB     (On MAR Hold since 09/20/13 1215)  Comments:  Pharmacy may adjust dosing strength, interval, or rate of medication as needed for optimal therapy for the patient Send with patient on call to the OR.  Anesthesia to complete antibiotic administration <69mn [facility]   2 g 100 mL/hr over 30 Minutes Intravenous On call to O.R. 09/19/13 1119 09/20/13 1315      Assessment/Plan: Problem List: Patient Active Problem List   Diagnosis Date Noted  . Malnutrition of moderate degree 09/19/2013  . 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 Antral ulcer 09/16/2013  . Migraine headache 09/14/2013  . Intractable vomiting 09/14/2013  . PTSD (post-traumatic stress disorder) 08/19/2013  . Left knee injury 07/30/2013  . Low back pain 07/30/2013  . Mitral regurgitation 02/15/2013  . Chest pain 01/23/2012  . Essential hypertension 01/23/2012  . Murmur 01/23/2012    Will begin clear liquids pO.  Path pending.  2 Days Post-Op    LOS: 8 days   Matt B. MHassell Done MD, FSouthwest General [facility] Center[facility], P.[facility] [phone] [phone] 09/22/2013 9:29 AM
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
## 2013-09-22 NOTE — Evaluation (Addendum)
Physical Therapy Evaluation Patient Details Name: Paula [name] MRN: [id number] DOB: 1965-09-22 Today's Date: 09/22/2013   History of Present Illness       48 yo female adm 09/14/13 with Gastric carcinoma, s/p Distal gastrectomy 09/20/13; PMHx: costochondritis; -- MRI shows resolving SDH; CT neg for PE, small infiltrate positive for R sided pna          Clinical Impression  Pt will benefit from PT to address deficits below; HR better controlled  Today (as compared to yesterday with RN-with pt standing); HR 112-118 during PT eval;     Follow Up Recommendations Home health PT    Equipment Recommendations  Rolling walker with 5" wheels    Recommendations for Other Services       Precautions / Restrictions Precautions Precautions: Fall Precaution Comments: JP drain, pain pump      Mobility  Bed Mobility Overal bed mobility: Needs Assistance;+ 2 for safety/equipment Bed Mobility: Supine to Sit;Sit to Supine     Supine to sit: HOB elevated;Min assist Sit to supine: Min assist;Mod assist   General bed mobility comments: incr time and cues for technique, educated in rolling but felt unable to do so at this time due pain  Transfers Overall transfer level: Needs assistance Equipment used: Rolling walker (2 wheeled);None Transfers: Sit to/from Stand Sit to Stand: Min assist         General transfer comment: cues for hand placement and overall safety  Ambulation/Gait Ambulation/Gait assistance: +2 safety/equipment Ambulation Distance (Feet): 90 Feet Assistive device: Rolling walker (2 wheeled);None;1 person hand held assist Gait Pattern/deviations: Step-to pattern;Step-through pattern;Trunk flexed Gait velocity: decr Gait velocity interpretation: Below normal speed for age/gender General Gait Details: cues for posture, breathing and to keep RW safe distance from self; 4 standing rests due to pain and fatigue  Stairs            Wheelchair Mobility     Modified Rankin (Stroke Patients Only)       Balance Overall balance assessment: Needs assistance Sitting-balance support: No upper extremity supported;Feet supported Sitting balance-Leahy Scale: Fair     Standing balance support: Bilateral upper extremity supported;No upper extremity supported Standing balance-Leahy Scale: Fair                               Pertinent Vitals/Pain C/o abd pain not rated --using PCA    Home Living Family/patient expects to be discharged to:: Private residence Living Arrangements: Alone Available Help at Discharge: Family;Available 24 hours/day Type of Home: House           Additional Comments: may go to her mother's house, pt is unsure about her D/C plan at this time    Prior Function Level of Independence: Independent               Hand Dominance        Extremity/Trunk Assessment   Upper Extremity Assessment: Generalized weakness           Lower Extremity Assessment: Generalized weakness         Communication   Communication: No difficulties  Cognition Arousal/Alertness: Awake/alert Behavior During Therapy: WFL for tasks assessed/performed Overall Cognitive Status: Within Functional Limits for tasks assessed                      General Comments      Exercises        Assessment/Plan    PT  Assessment Patient needs continued PT services  PT Diagnosis Difficulty walking   PT Problem List Decreased strength;Decreased range of motion;Decreased activity tolerance;Decreased balance;Decreased mobility;Decreased knowledge of use of DME  PT Treatment Interventions DME instruction;Gait training;Functional mobility training;Therapeutic activities;Therapeutic exercise;Patient/family education   PT Goals (Current goals can be found in the Care Plan section)      Frequency Min 3X/week   Barriers to discharge        Co-evaluation               End of Session   Activity Tolerance:  Patient tolerated treatment well Patient left: with call bell/phone within reach;in bed Nurse Communication: Mobility status         Time: 1245-8099 PT Time Calculation (min): 30 min   Charges:   PT Evaluation $Initial PT Evaluation Tier I: 1 Procedure PT Treatments $Gait Training: 23-37 mins   PT G Codes:          Anda Sobotta 09-25-2013, 1:39 PM

## 2013-09-23 ENCOUNTER — Encounter (HOSPITAL_COMMUNITY): Payer: Self-pay | Admitting: General Surgery

## 2013-09-23 ENCOUNTER — Other Ambulatory Visit: Payer: Self-pay | Admitting: Urology

## 2013-09-23 DIAGNOSIS — K929 Disease of digestive system, unspecified: Secondary | ICD-10-CM

## 2013-09-23 DIAGNOSIS — K560 Paralytic ileus: Secondary | ICD-10-CM

## 2013-09-23 LAB — CBC
HCT: 27.9 % — ABNORMAL LOW (ref 36.0–46.0)
HEMOGLOBIN: 8.4 g/dL — AB (ref 12.0–15.0)
MCH: 26.8 pg (ref 26.0–34.0)
MCHC: 30.1 g/dL (ref 30.0–36.0)
MCV: 88.9 fL (ref 78.0–100.0)
Platelets: 200 10*3/uL (ref 150–400)
RBC: 3.14 MIL/uL — ABNORMAL LOW (ref 3.87–5.11)
RDW: 15.9 % — AB (ref 11.5–15.5)
WBC: 10.3 10*3/uL (ref 4.0–10.5)

## 2013-09-23 LAB — MAGNESIUM
MAGNESIUM: UNDETERMINED mg/dL (ref 1.5–2.5)
Magnesium: 1.8 mg/dL (ref 1.5–2.5)

## 2013-09-23 LAB — PREALBUMIN: PREALBUMIN: 9.3 mg/dL — AB (ref 17.0–34.0)

## 2013-09-23 LAB — GLUCOSE, CAPILLARY
Glucose-Capillary: 110 mg/dL — ABNORMAL HIGH (ref 70–99)
Glucose-Capillary: 116 mg/dL — ABNORMAL HIGH (ref 70–99)
Glucose-Capillary: 117 mg/dL — ABNORMAL HIGH (ref 70–99)
Glucose-Capillary: 136 mg/dL — ABNORMAL HIGH (ref 70–99)
Glucose-Capillary: 142 mg/dL — ABNORMAL HIGH (ref 70–99)

## 2013-09-23 LAB — DIFFERENTIAL
BASOS PCT: 0 % (ref 0–1)
Basophils Absolute: 0 10*3/uL (ref 0.0–0.1)
Eosinophils Absolute: 0.2 10*3/uL (ref 0.0–0.7)
Eosinophils Relative: 2 % (ref 0–5)
Lymphocytes Relative: 17 % (ref 12–46)
Lymphs Abs: 1.7 10*3/uL (ref 0.7–4.0)
Monocytes Absolute: 0.9 10*3/uL (ref 0.1–1.0)
Monocytes Relative: 9 % (ref 3–12)
NEUTROS ABS: 7.5 10*3/uL (ref 1.7–7.7)
NEUTROS PCT: 72 % (ref 43–77)

## 2013-09-23 LAB — TRIGLYCERIDES
TRIGLYCERIDES: UNDETERMINED mg/dL (ref <150)
Triglycerides: 69 mg/dL (ref <150)

## 2013-09-23 LAB — BASIC METABOLIC PANEL
Anion gap: 8 (ref 5–15)
BUN: 15 mg/dL (ref 6–23)
CO2: 31 mEq/L (ref 19–32)
CREATININE: 0.7 mg/dL (ref 0.50–1.10)
Calcium: 8.5 mg/dL (ref 8.4–10.5)
Chloride: 95 mEq/L — ABNORMAL LOW (ref 96–112)
GFR calc Af Amer: >90 mL/min (ref >90)
Glucose, Bld: 140 mg/dL — ABNORMAL HIGH (ref 70–99)
Potassium: 3.9 mEq/L (ref 3.7–5.3)
Sodium: 134 mEq/L — ABNORMAL LOW (ref 137–147)

## 2013-09-23 LAB — PHOSPHORUS
PHOSPHORUS: UNDETERMINED mg/dL (ref 2.3–4.6)
Phosphorus: 3.3 mg/dL (ref 2.3–4.6)

## 2013-09-23 MED ORDER — TRACE MINERALS CR-CU-F-FE-I-MN-MO-SE-ZN IV SOLN
INTRAVENOUS | Status: AC
  Administered 2013-09-23: 17:00:00 via INTRAVENOUS
  Filled 2013-09-23: qty 2000

## 2013-09-23 MED ORDER — BOOST / RESOURCE BREEZE PO LIQD
1.0000 | ORAL | Status: DC
  Administered 2013-09-24 – 2013-09-28 (×4): 1 via ORAL
  Administered 2013-10-01: 10:00:00 via ORAL
  Administered 2013-10-02 – 2013-10-03 (×2): 1 via ORAL

## 2013-09-23 MED ORDER — HYDROMORPHONE HCL PF 2 MG/ML IJ SOLN
3.0000 mg | INTRAMUSCULAR | Status: DC | PRN
  Administered 2013-09-23 – 2013-09-24 (×3): 3 mg via INTRAVENOUS
  Administered 2013-09-24: 3.5 mg via INTRAVENOUS
  Administered 2013-09-24 – 2013-09-28 (×17): 3 mg via INTRAVENOUS
  Filled 2013-09-23 (×19): qty 2

## 2013-09-23 MED ORDER — OXYCODONE HCL 5 MG PO TABS
5.0000 mg | ORAL_TABLET | ORAL | Status: DC | PRN
  Administered 2013-09-23 – 2013-09-25 (×8): 15 mg via ORAL
  Filled 2013-09-23 (×8): qty 3

## 2013-09-23 MED ORDER — METOPROLOL TARTRATE 12.5 MG HALF TABLET
12.5000 mg | ORAL_TABLET | Freq: Two times a day (BID) | ORAL | Status: DC
  Administered 2013-09-23 – 2013-09-24 (×3): 12.5 mg via ORAL
  Filled 2013-09-23 (×4): qty 1

## 2013-09-23 MED ORDER — HYDROMORPHONE HCL PF 2 MG/ML IJ SOLN: 3.0000 mg | INTRAMUSCULAR | Status: DC | PRN

## 2013-09-23 MED ORDER — FAT EMULSION 20 % IV EMUL
250.0000 mL | INTRAVENOUS | Status: AC
  Administered 2013-09-23: 250 mL via INTRAVENOUS
  Filled 2013-09-23: qty 250

## 2013-09-23 NOTE — Progress Notes (Signed)
PARENTERAL NUTRITION CONSULT NOTE - Follow Up  Pharmacy Consult for TNA Indication:  Adenocarcinoma -gastric, unable to tolerate PO intake  No Known Allergies  Patient Measurements: Height: 5\' 5"  (165.1 cm) Weight: 156 lb 1.4 oz (70.8 kg) IBW/kg (Calculated) : 57 Adjusted Body Weight: 62 3-7 lb weight loss per RD notes  Vital Signs: Temp: 98.3 F (36.8 C) (08/03 0531) Temp src: Oral (08/03 0531) BP: 103/64 mmHg (08/03 0531) Pulse Rate: 110 (08/03 0531) Intake/Output from previous day: 08/02 0701 - 08/03 0700 In: 3200 [P.O.:240; I.V.:840; TPN:2120] Out: 1575 [Urine:1550; Drains:25] Intake/Output from this shift: Total I/O In: -  Out: 40 [Drains:40]  Labs:  Recent Labs  09/21/13 0500 09/22/13 0345 09/23/13 0424  WBC 14.2* 12.1* 10.3  HGB 10.7* 9.0* 8.4*  HCT 34.2* 28.8* 27.9*  PLT 281 230 200     Recent Labs  09/21/13 0500 09/22/13 0345 09/23/13 0424 09/23/13 0600 09/23/13 0835  NA 137 136*  --  134*  --   K 4.0 3.4*  --  3.9  --   CL 100 96  --  95*  --   CO2 29 32  --  31  --   GLUCOSE 158* 136*  --  140*  --   BUN 10 13  --  15  --   CREATININE 0.64 0.65  --  0.70  --   CALCIUM 8.6 8.2*  --  8.5  --   MG 1.8  --  SPECIMEN CONTAMINATED, UNABLE TO PERFORM TEST(S).  --  1.8  PHOS 2.8  --  SPECIMEN CONTAMINATED, UNABLE TO PERFORM TEST(S).  --  3.3  TRIG  --   --  SPECIMEN CONTAMINATED, UNABLE TO PERFORM TEST(S).  --   --    Estimated Creatinine Clearance: 86.7 ml/min (by C-G formula based on Cr of 0.7).    Recent Labs  09/22/13 1715 09/23/13 0027 09/23/13 0527  GLUCAP 88 110* 136*    Medical History: Past Medical History  Diagnosis Date  . Headache(784.0) 2011    Migraines  . Esophageal reflux     On omeprazole  . Costochondritis   . Hypertension     started around 2011  . Ovarian cyst     Medications:  Scheduled:  . carvedilol  6.25 mg Oral BID WC  . enoxaparin (LOVENOX) injection  40 mg Subcutaneous Q24H  . HYDROmorphone PCA 0.3  mg/mL   Intravenous 6 times per day  . insulin aspart  0-9 Units Subcutaneous 4 times per day  . multivitamin with minerals  1 tablet Oral Daily  . pantoprazole (PROTONIX) IV  40 mg Intravenous Q12H   Infusions:  . sodium chloride 35 mL/hr at 09/21/13 1825  . bupivacaine ON-Q pain pump    . Marland KitchenTPN (CLINIMIX-E) Adult 80 mL/hr at 09/22/13 1732   And  . fat emulsion 250 mL (09/22/13 1733)    Insulin Requirements in the past 24 hours:  2 unit, sens SSI  Current Nutrition:  NPO  Nutritional Goals:   Per RD assessment, 1800-2000 kCal,  85-95 grams of protein per day  Clinimix 5/15 at goal rate of 80 ml/hr + lipid to provide 96g protein and 1843 kcal/day  IVF:  NS @ 35 ml/hr  Lines:  PICC placed 7/30  Assessment: 48 yo female with newly diagnosed poorly differentiated gastric adenocarcinoma with plan per [facility] for massive bowel resection scheduled for 7/31. To place PICC line and go ahead and start TNA per pharmacy dosing this evening if possible.  8/3: D#5 TNA.  POD3 for distal gastrectomy, feeding GJT placement.  Ileus continues post-op. Per [facility], plan continue TNA, clear liquid diet and await bowel function, cont JP drain  Lytes: Na slightly low but everything else WNL , phos was high with early AM labs so repeated lab and was WNL on second draw Renal: stable, CrCl 87 ml/min Hepatic: WNL CBGs: stable < 150 TGs: 101 (7/31), contaminated specimen 8/3 - will reorder for 8/4 Prealb: 12.7 (7/31), not drawn - will reorder  Plan:  At 1800 tonight: 1) Continue Clinimix E5/15 at goal rate of 80 ml/hr and continue lipids at rate of 10 ml/hr with trace elements daily 2) Receiving MVI as PO daily (first dose 8/3) - tolerated first dose well 3) Change CBG/sensitive SSI to with meals secondary to stable CBGs 4) Mon/Thurs TNA labs 5) Continue IV fluids at 35 ml/hr   Adrian Saran, PharmD, BCPS Pager [phone] 09/23/2013 10:39 AM
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
## 2013-09-23 NOTE — Evaluation (Signed)
Occupational Therapy Evaluation Patient Details Name: Paula [name] MRN: [id number] DOB: [birth year] Today's Date: 09/23/2013    History of Present Illness 48 yo female adm  09/14/13 with Gastric carcinoma, s/p Distal gastrectomy 09/20/13; PMHx: costochondritis;         -- MRI shows resolving SDH; CT neg for PE, small infiltrate positive for R sided pna   Clinical Impression   Pt states she was fatigued after up to bathroom to perform grooming at the sink. Helped into recliner to sit up. Educated on DME options for toileting. She will benefit from continued OT services to increase activity tolerance and improve independence with self care tasks.     Follow Up Recommendations  Home health OT;Supervision/Assistance - 24 hour    Equipment Recommendations  3 in 1 bedside comode    Recommendations for Other Services       Precautions / Restrictions Precautions Precautions: Fall Precaution Comments: JP drain Restrictions Weight Bearing Restrictions: No      Mobility Bed Mobility Overal bed mobility: Needs Assistance Bed Mobility: Supine to Sit     Supine to sit: HOB elevated;Min guard     General bed mobility comments: increased time.  pt sitting up elevated HOB  Transfers Overall transfer level: Needs assistance Equipment used: Rolling walker (2 wheeled) (second time standing) Transfers: Sit to/from Stand Sit to Stand: Min assist         General transfer comment: min assist with and without walker use. min to steady and verbal cues for hand placement.    Balance                                            ADL Overall ADL's : Needs assistance/impaired Eating/Feeding:  (clear liquid diet)   Grooming: Oral care;Standing;Minimal assistance   Upper Body Bathing: Minimal assitance;Sitting   Lower Body Bathing: Minimal assistance;Sit to/from stand   Upper Body Dressing : Minimal assistance;Sitting   Lower Body Dressing: Minimal  assistance;Sit to/from stand   Toilet Transfer: Minimal assistance;Ambulation;RW Toilet Transfer Details (indicate cue type and reason): used IV pole into bathroom and walker out of bathroom as pt unsteady. Toileting- Clothing Manipulation and Hygiene: Minimal assistance;Sit to/from stand         General ADL Comments: Discussed crossing LEs up for LB ADL and pt able to do so well. Also discussed 3in1 to raise commode up and have armrests to help with transitions and pt would like a 3in1. Pt sleepy toward end of session and nursing made aware.      Vision                     Perception     Praxis      Pertinent Vitals/Pain 7/10 at start of session; abdomen; down to 5/10 at end of session. Reposition.     Hand Dominance     Extremity/Trunk Assessment Upper Extremity Assessment Upper Extremity Assessment: Generalized weakness           Communication Communication Communication: No difficulties   Cognition Arousal/Alertness: Awake/alert (sleep toward the end of session) Behavior During Therapy: WFL for tasks assessed/performed Overall Cognitive Status: Within Functional Limits for tasks assessed                     General Comments       Exercises  Shoulder Instructions      Home Living Family/patient expects to be discharged to:: Private residence Living Arrangements: Alone Available Help at Discharge: Family;Available 24 hours/day Type of Home: House             Bathroom Shower/Tub: Teacher, early years/pre: Standard         Additional Comments: may go to her mother's house, pt is unsure about her D/C plan at this time      Prior Functioning/Environment Level of Independence: Independent             OT Diagnosis: Generalized weakness   OT Problem List: Decreased strength;Decreased knowledge of use of DME or AE   OT Treatment/Interventions: Self-care/ADL training;Patient/family education;Therapeutic  activities;DME and/or AE instruction    OT Goals(Current goals can be found in the care plan section) Acute Rehab OT Goals Patient Stated Goal: to be independent and do for herself.  OT Goal Formulation: With patient Time For Goal Achievement: 10/07/13 Potential to Achieve Goals: Good  OT Frequency: Min 2X/week   Barriers to D/C:            Co-evaluation              End of Session Equipment Utilized During Treatment: Rolling walker  Activity Tolerance: Patient limited by fatigue Patient left: in chair;with call bell/phone within reach   Time: 1053-1128 OT Time Calculation (min): 35 min Charges:  OT General Charges $OT Visit: 1 Procedure OT Evaluation $Initial OT Evaluation Tier I: 1 Procedure OT Treatments $Self Care/Home Management : 8-22 mins $Therapeutic Activity: 8-22 mins G-Codes:    Jules Schick [phone] 09/23/2013, 11:38 AM

## 2013-09-23 NOTE — Progress Notes (Signed)
CARE MANAGEMENT NOTE 09/23/2013  Patient:  Paula [name], Paula [name]   Account Number:  [id number]  Date Initiated:  09/16/2013  Documentation initiated by:  Dessa Phi  Subjective/Objective Assessment:   48 Y/O F ADMITTED W/VHEST PAIN,N/V.     Action/Plan:   FROM HOME.   Anticipated DC Date:  09/18/2013   Anticipated DC Plan:  [facility]  CM consult      Choice offered to / List presented to:  C-1 Patient   DME arranged  [place] arranged  HH-1 RN  [place].   Status of service:  In process, will continue to follow Medicare Important Message given?   (If response is "NO", the following Medicare IM given date fields will be blank) Date Medicare IM given:   Medicare IM given by:   Date Additional Medicare IM given:   Additional Medicare IM given by:    Discharge Disposition:    Per UR Regulation:  Reviewed for med. necessity/level of care/duration of stay  If discussed at [place] of Stay Meetings, dates discussed:    Comments:  09/23/13 MMcGibboney, RN, BSN Pt selected [facility] for [facility] and DME.  Referral given to in house rep.   09/16/13 KATHY MAHABIR RN,BSN NCM [phone] EGD TODAY.

## 2013-09-23 NOTE — Progress Notes (Signed)
Patient ID: Paula [name], female   DOB: [birth year], 48 y.o.   MRN: [id number]   Subjective: No flatus.  No BM.  Intermittent nausea.  C/o abd and low back pain.  Ambulated with therapies yesterday.  Had lots of questions, all answered.    Objective:  Vital signs:  Filed Vitals:   09/22/13 2223 09/23/13 0002 09/23/13 0400 09/23/13 0531  BP: 106/67   103/64  Pulse: 107   110  Temp: 99.3 F (37.4 C)   98.3 F (36.8 C)  TempSrc: Oral   Oral  Resp: _0 Height:      Weight:      SpO2: 99% 100% 100% 100%    Last BM Date: 09/19/13  Intake/Output   Yesterday:  08/02 0701 - 08/03 0700 In: 3200 [P.O.:240; I.V.:840; TPN:2120] Out: 0867 [Urine:1550; Drains:25] This shift:  Total I/O In: -  Out: 34 [Drains:40]  Bowel function:  Flatus: n  BM: n  Drain: 83m/24 yest, 424mthis AM  Physical Exam: General: Pt awake/alert/oriented x4 in no acute distress Abdomen: Soft.  Nondistended. Appropriately tender.  Midline incision-staples in place, edges approximated, no erythema or drainage.  RUQ with serosanguinous output.  GJ tube site without erythema.   No evidence of peritonitis.  No incarcerated hernias.   Problem List:   Principal Problem:   Gastric carcinoma s/p Distal gastrectomy/GJ ([facility]th II) 09/20/2013 Active Problems:   Chest pain   Essential hypertension   Migraine headache   Intractable vomiting   Antral ulcer   Malnutrition of moderate degree    Results:   Labs: Results for orders placed during the hospital encounter of 09/14/13 (from the past 48 hour(s))  GLUCOSE, CAPILLARY     Status: Abnormal   Collection Time    09/21/13 11:50 AM      Result Value Ref Range   Glucose-Capillary 133 (*) 70 - 99 mg/dL  GLUCOSE, CAPILLARY     Status: Abnormal   Collection Time    09/21/13  6:19 PM      Result Value Ref Range   Glucose-Capillary 120 (*) 70 - 99 mg/dL  GLUCOSE, CAPILLARY     Status: Abnormal   Collection Time    09/22/13 12:04 AM       Result Value Ref Range   Glucose-Capillary 103 (*) 70 - 99 mg/dL   Comment 1 Documented in Chart     Comment 2 Notify RN    CBC     Status: Abnormal   Collection Time    09/22/13  3:45 AM      Result Value Ref Range   WBC 12.1 (*) 4.0 - 10.5 K/uL   RBC 3.45 (*) 3.87 - 5.11 MIL/uL   Hemoglobin 9.0 (*) 12.0 - 15.0 g/dL   HCT 28.8 (*) 36.0 - 46.0 %   MCV 83.5  78.0 - 100.0 fL   MCH 26.1  26.0 - 34.0 pg   MCHC 31.3  30.0 - 36.0 g/dL   RDW 15.2  11.5 - 15.5 %   Platelets 230  150 - 400 K/uL  BASIC METABOLIC PANEL     Status: Abnormal   Collection Time    09/22/13  3:45 AM      Result Value Ref Range   Sodium 136 (*) 137 - 147 mEq/L   Potassium 3.4 (*) 3.7 - 5.3 mEq/L   Chloride 96  96 - 112 mEq/L   CO2 32  19 - 32 mEq/L   Glucose, Bld  136 (*) 70 - 99 mg/dL   BUN 13  6 - 23 mg/dL   Creatinine, Ser 0.65  0.50 - 1.10 mg/dL   Calcium 8.2 (*) 8.4 - 10.5 mg/dL   GFR calc non Af Amer >90  >90 mL/min   GFR calc Af Amer >90  >90 mL/min   Comment: (NOTE)     The eGFR has been calculated using the CKD EPI equation.     This calculation has not been validated in all clinical situations.     eGFR's persistently <90 mL/min signify possible Chronic Kidney     Disease.   Anion gap 8  5 - 15  GLUCOSE, CAPILLARY     Status: Abnormal   Collection Time    09/22/13  6:49 AM      Result Value Ref Range   Glucose-Capillary 144 (*) 70 - 99 mg/dL   Comment 1 Documented in Chart     Comment 2 Notify RN    GLUCOSE, CAPILLARY     Status: Abnormal   Collection Time    09/22/13 11:54 AM      Result Value Ref Range   Glucose-Capillary 126 (*) 70 - 99 mg/dL  GLUCOSE, CAPILLARY     Status: None   Collection Time    09/22/13  5:15 PM      Result Value Ref Range   Glucose-Capillary 88  70 - 99 mg/dL  GLUCOSE, CAPILLARY     Status: Abnormal   Collection Time    09/23/13 12:27 AM      Result Value Ref Range   Glucose-Capillary 110 (*) 70 - 99 mg/dL  MAGNESIUM     Status: None   Collection Time     09/23/13  4:24 AM      Result Value Ref Range   Magnesium 2.1  1.5 - 2.5 mg/dL  PHOSPHORUS     Status: Abnormal   Collection Time    09/23/13  4:24 AM      Result Value Ref Range   Phosphorus 6.2 (*) 2.3 - 4.6 mg/dL  CBC     Status: Abnormal   Collection Time    09/23/13  4:24 AM      Result Value Ref Range   WBC 10.3  4.0 - 10.5 K/uL   RBC 3.14 (*) 3.87 - 5.11 MIL/uL   Hemoglobin 8.4 (*) 12.0 - 15.0 g/dL   HCT 27.9 (*) 36.0 - 46.0 %   MCV 88.9  78.0 - 100.0 fL   MCH 26.8  26.0 - 34.0 pg   MCHC 30.1  30.0 - 36.0 g/dL   RDW 15.9 (*) 11.5 - 15.5 %   Platelets 200  150 - 400 K/uL  DIFFERENTIAL     Status: None   Collection Time    09/23/13  4:24 AM      Result Value Ref Range   Neutrophils Relative % 72  43 - 77 %   Neutro Abs 7.5  1.7 - 7.7 K/uL   Lymphocytes Relative 17  12 - 46 %   Lymphs Abs 1.7  0.7 - 4.0 K/uL   Monocytes Relative 9  3 - 12 %   Monocytes Absolute 0.9  0.1 - 1.0 K/uL   Eosinophils Relative 2  0 - 5 %   Eosinophils Absolute 0.2  0.0 - 0.7 K/uL   Basophils Relative 0  0 - 1 %   Basophils Absolute 0.0  0.0 - 0.1 K/uL  TRIGLYCERIDES     Status:  Abnormal   Collection Time    09/23/13  4:24 AM      Result Value Ref Range   Triglycerides 382 (*) <150 mg/dL   Comment: Performed at [facility], CAPILLARY     Status: Abnormal   Collection Time    09/23/13  5:27 AM      Result Value Ref Range   Glucose-Capillary 136 (*) 70 - 99 mg/dL  BASIC METABOLIC PANEL     Status: Abnormal   Collection Time    09/23/13  6:00 AM      Result Value Ref Range   Sodium 134 (*) 137 - 147 mEq/L   Potassium 3.9  3.7 - 5.3 mEq/L   Chloride 95 (*) 96 - 112 mEq/L   CO2 31  19 - 32 mEq/L   Glucose, Bld 140 (*) 70 - 99 mg/dL   BUN 15  6 - 23 mg/dL   Creatinine, Ser 0.70  0.50 - 1.10 mg/dL   Calcium 8.5  8.4 - 10.5 mg/dL   GFR calc non Af Amer >90  >90 mL/min   GFR calc Af Amer >90  >90 mL/min   Comment: (NOTE)     The eGFR has been calculated using the  CKD EPI equation.     This calculation has not been validated in all clinical situations.     eGFR's persistently <90 mL/min signify possible Chronic Kidney     Disease.   Anion gap 8  5 - 15    Imaging / Studies: No results found.  Scheduled Meds: . carvedilol  6.25 mg Oral BID WC  . enoxaparin (LOVENOX) injection  40 mg Subcutaneous Q24H  . HYDROmorphone PCA 0.3 mg/mL   Intravenous 6 times per day  . insulin aspart  0-9 Units Subcutaneous 4 times per day  . multivitamin with minerals  1 tablet Oral Daily  . pantoprazole (PROTONIX) IV  40 mg Intravenous Q12H   Continuous Infusions: . sodium chloride 35 mL/hr at 09/21/13 1825  . bupivacaine ON-Q pain pump    . Marland KitchenTPN (CLINIMIX-E) Adult 80 mL/hr at 09/22/13 1732   And  . fat emulsion 250 mL (09/22/13 1733)   PRN Meds:.acetaminophen, acetaminophen, albuterol, ALPRAZolam, diphenhydrAMINE, diphenhydrAMINE, guaiFENesin-dextromethorphan, hydrALAZINE, hydrOXYzine, metoCLOPramide (REGLAN) injection, metoprolol, naloxone, ondansetron (ZOFRAN) IV, ondansetron, oxyCODONE, promethazine, sodium chloride, sodium chloride   Antibiotics: Anti-infectives   Start     Dose/Rate Route Frequency Ordered Stop   09/20/13 1915  cefOXitin (MEFOXIN) 1 g in dextrose 5 % 50 mL IVPB     1 g 100 mL/hr over 30 Minutes Intravenous Every 6 hours 09/20/13 1903 09/21/13 0703   09/20/13 0600  [MAR Hold]  cefOXitin (MEFOXIN) 2 g in dextrose 5 % 50 mL IVPB     (On MAR Hold since 09/20/13 1215)  Comments:  Pharmacy may adjust dosing strength, interval, or rate of medication as needed for optimal therapy for the patient Send with patient on call to the OR.  Anesthesia to complete antibiotic administration <83mn [facility]   2 g 100 mL/hr over 30 Minutes Intravenous On call to O.R. 09/19/13 1119 09/20/13 1315      Assessment/Plan Gastric adenocarcinoma S/p diagnostic laparoscopy, distal gastrectomy with BII anastamosis and feeding GJ  tube---Dr. BBarry Dienes7/31/15 POD#3 -continue with clears and await bowel function -continue with TPN -will remove ON-Q this evening -increase oxycodone, will try to wean off dilaudid PCA, likely tomorrow since ON-Q is being removed -mobilize to help recovery -  GJ tube clamp -continue JP drain  Erby Pian, [facility] Pager [phone] Office [phone]  09/23/2013 8:53 AM
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
## 2013-09-23 NOTE — Progress Notes (Signed)
[facility] is providing the following services: RW and Hospital Bed  If patient discharges after hours, please call [phone].   Linward Headland 09/23/2013, 12:51 PM

## 2013-09-23 NOTE — Progress Notes (Signed)
PT Cancellation Note  Patient Details Name: Paula [name] MRN: [id number] DOB: 1965-03-16   Cancelled Treatment:    Reason Eval/Treat Not Completed: Pain limiting ability to participate Pt reporting pain and being very emotional, RN just in to medicate pt.  Will check back as schedule permits.   Dmonte Maher,KATHrine E 09/23/2013, 1:52 PM Carmelia Bake, PT, DPT 09/23/2013 Pager: [phone]

## 2013-09-23 NOTE — Progress Notes (Signed)
CRITICAL VALUE ALERT  Critical value received:  Glucose 820  Date of notification:  09/23/2013   Time of notification:  0520  Critical value read back:Yes.    Nurse who received alert:  Sabino Gasser, RN  MD notified (1st page):  Fredirick Maudlin, NP   Time of first page:  0530  MD notified (2nd page):  Time of second page:  Responding MD:  Fredirick Maudlin  Time MD responded:  0532  T. Rogue Bussing made aware rechecked with our glucometers with result of 136. New orders to redraw.

## 2013-09-24 DIAGNOSIS — E440 Moderate protein-calorie malnutrition: Secondary | ICD-10-CM

## 2013-09-24 DIAGNOSIS — R300 Dysuria: Secondary | ICD-10-CM

## 2013-09-24 LAB — URINALYSIS, ROUTINE W REFLEX MICROSCOPIC
BILIRUBIN URINE: NEGATIVE
GLUCOSE, UA: NEGATIVE mg/dL
Hgb urine dipstick: NEGATIVE
Ketones, ur: NEGATIVE mg/dL
Leukocytes, UA: NEGATIVE
Nitrite: NEGATIVE
PH: 5.5 (ref 5.0–8.0)
Protein, ur: NEGATIVE mg/dL
SPECIFIC GRAVITY, URINE: 1.012 (ref 1.005–1.030)
Urobilinogen, UA: 0.2 mg/dL (ref 0.0–1.0)

## 2013-09-24 LAB — BASIC METABOLIC PANEL
Anion gap: 5 (ref 5–15)
BUN: 13 mg/dL (ref 6–23)
CHLORIDE: 99 meq/L (ref 96–112)
CO2: 31 mEq/L (ref 19–32)
Calcium: 8.3 mg/dL — ABNORMAL LOW (ref 8.4–10.5)
Creatinine, Ser: 0.64 mg/dL (ref 0.50–1.10)
Glucose, Bld: 144 mg/dL — ABNORMAL HIGH (ref 70–99)
POTASSIUM: 4.4 meq/L (ref 3.7–5.3)
Sodium: 135 mEq/L — ABNORMAL LOW (ref 137–147)

## 2013-09-24 LAB — TRIGLYCERIDES: Triglycerides: 75 mg/dL (ref <150)

## 2013-09-24 LAB — GLUCOSE, CAPILLARY
GLUCOSE-CAPILLARY: 149 mg/dL — AB (ref 70–99)
Glucose-Capillary: 113 mg/dL — ABNORMAL HIGH (ref 70–99)
Glucose-Capillary: 145 mg/dL — ABNORMAL HIGH (ref 70–99)

## 2013-09-24 LAB — PREALBUMIN: Prealbumin: 8.8 mg/dL — ABNORMAL LOW (ref 17.0–34.0)

## 2013-09-24 LAB — PHOSPHORUS: PHOSPHORUS: 3.1 mg/dL (ref 2.3–4.6)

## 2013-09-24 LAB — MAGNESIUM: MAGNESIUM: 1.8 mg/dL (ref 1.5–2.5)

## 2013-09-24 MED ORDER — METHOCARBAMOL 500 MG PO TABS: 500.0000 mg | ORAL_TABLET | Freq: Three times a day (TID) | ORAL | Status: DC | PRN

## 2013-09-24 MED ORDER — TRACE MINERALS CR-CU-F-FE-I-MN-MO-SE-ZN IV SOLN
INTRAVENOUS | Status: AC
  Administered 2013-09-24: 17:00:00 via INTRAVENOUS
  Filled 2013-09-24: qty 2000

## 2013-09-24 MED ORDER — INSULIN ASPART 100 UNIT/ML ~~LOC~~ SOLN
0.0000 [IU] | Freq: Three times a day (TID) | SUBCUTANEOUS | Status: DC
  Administered 2013-09-24 (×2): 1 [IU] via SUBCUTANEOUS
  Administered 2013-09-25: 2 [IU] via SUBCUTANEOUS
  Administered 2013-09-25 – 2013-09-26 (×3): 1 [IU] via SUBCUTANEOUS
  Administered 2013-09-27: 2 [IU] via SUBCUTANEOUS
  Administered 2013-09-27 – 2013-09-28 (×3): 1 [IU] via SUBCUTANEOUS
  Administered 2013-09-29 (×3): 2 [IU] via SUBCUTANEOUS
  Administered 2013-09-30 – 2013-10-03 (×7): 1 [IU] via SUBCUTANEOUS

## 2013-09-24 MED ORDER — FAT EMULSION 20 % IV EMUL
250.0000 mL | INTRAVENOUS | Status: AC
  Administered 2013-09-24: 250 mL via INTRAVENOUS
  Filled 2013-09-24: qty 250

## 2013-09-24 MED ORDER — DIPHENHYDRAMINE HCL 25 MG PO CAPS
25.0000 mg | ORAL_CAPSULE | ORAL | Status: DC | PRN
  Administered 2013-09-24 – 2013-09-25 (×2): 25 mg via ORAL
  Filled 2013-09-24 (×2): qty 1

## 2013-09-24 MED ORDER — METOPROLOL TARTRATE 25 MG PO TABS
25.0000 mg | ORAL_TABLET | Freq: Two times a day (BID) | ORAL | Status: DC
  Administered 2013-09-24 – 2013-09-26 (×5): 25 mg via ORAL
  Filled 2013-09-24 (×7): qty 1

## 2013-09-24 NOTE — Progress Notes (Signed)
Physical Therapy Treatment Patient Details Name: Paula [name] MRN: [id number] DOB: 07-Apr-1965 Today's Date: 09/24/2013    History of Present Illness 48 yo female adm  09/14/13 with Gastric carcinoma, s/p Distal gastrectomy 09/20/13; PMHx: costochondritis;         -- MRI shows resolving SDH; CT neg for PE, small infiltrate positive for R sided pna    PT Comments    Assisted pt out of recliner to amb in hallway using RW.  Pt unable to stand erect 2nd ABD pain.  Assisted back to bed with increased time for comfort.    Follow Up Recommendations  Home health PT     Equipment Recommendations       Recommendations for Other Services       Precautions / Restrictions Precautions Precautions: Fall Precaution Comments: JP drain/liquid diet Restrictions Weight Bearing Restrictions: No    Mobility  Bed Mobility Overal bed mobility: Needs Assistance Bed Mobility: Sit to Supine       Sit to supine: Min assist;Mod assist   General bed mobility comments: assist to support B LE up onto bed and increased time to position to comfort  Transfers Overall transfer level: Needs assistance Equipment used: Rolling walker (2 wheeled) Transfers: Sit to/from Stand Sit to Stand: Min guard         General transfer comment: minguard for safety due to multiple lines/tubes  Ambulation/Gait Ambulation/Gait assistance: Min guard Ambulation Distance (Feet): 110 Feet Assistive device: Rolling walker (2 wheeled);None Gait Pattern/deviations: Step-to pattern;Step-through pattern;Trunk flexed Gait velocity: decreased   General Gait Details: 2 sitting rest breaks.  MAX c/o ABD "tightness" and "discomfort"   Stairs            Wheelchair Mobility    Modified Rankin (Stroke Patients Only)       Balance                                    Cognition Arousal/Alertness: Awake/alert Behavior During Therapy: WFL for tasks assessed/performed Overall Cognitive  Status: Within Functional Limits for tasks assessed                      Exercises      General Comments General comments (skin integrity, edema, etc.): min guard assist to pull up mesh underwear.      Pertinent Vitals/Pain C/o 5/10 ABD pain    Home Living                      Prior Function            PT Goals (current goals can now be found in the care plan section) Progress towards PT goals: Progressing toward goals    Frequency  Min 3X/week    PT Plan      Co-evaluation             End of Session Equipment Utilized During Treatment: Gait belt Activity Tolerance: Patient tolerated treatment well Patient left: in bed;with call bell/phone within reach;with family/visitor present     Time: 9470-9628 PT Time Calculation (min): 27 min  Charges:  $Gait Training: 8-22 mins $Therapeutic Activity: 8-22 mins                    G Codes:      Rica Koyanagi  PTA [facility]  Acute  Rehab Pager      [phone]

## 2013-09-24 NOTE — Progress Notes (Signed)
Patient ID: Paula [name], female   DOB: 02-Mar-1965, 48 y.o.   MRN: [id number]            [address] [place], [place] [address]       Phone: [phone] FAX: [phone]  Subjective: No flatus.  Still with intermittent nausea with clears, but no vomiting.  GJ tube clamped.  Walking in room, but not much more activity.  She complains of low back pain and urinary incontinence.  She has been afebrile.  VSS.    Objective:  Vital signs:  Filed Vitals:   09/23/13 1120 09/23/13 1500 09/23/13 2038 09/24/13 0538  BP:  109/71 129/74 141/81  Pulse: 125 109 123 129  Temp:  98.2 F (36.8 C) 98.7 F (37.1 C) 97.7 F (36.5 C)  TempSrc:  Oral Oral Oral  Resp:  _0 Height:      Weight:      SpO2: 98% 100% 100% 96%    Last BM Date: 09/19/13  Intake/Output   Yesterday:  08/03 0701 - 08/04 0700 In: 1165 [P.O.:360; I.V.:805] Out: 1595 [Urine:1500; Drains:95] This shift: I/O last 3 completed shifts: In: 13 [P.O.:360; I.V.:1260] Out: 2345 [Urine:2250; Drains:95]    Physical Exam: General: Pt awake/alert/oriented x4 in no acute distress Abdomen: Soft.  Nondistended. +bs.  appropriately tender.  RUQ drain with serosanguinous output.  ON-Q pump still in place.  Midline wound is c/d/i.  No evidence of peritonitis.  No incarcerated hernias.    Problem List:   Principal Problem:   Gastric carcinoma s/p Distal gastrectomy/GJ ([facility] II) 09/20/2013 Active Problems:   Chest pain   Essential hypertension   Migraine headache   Intractable vomiting   Antral ulcer   Malnutrition of moderate degree    Results:   Labs: Results for orders placed during the hospital encounter of 09/14/13 (from the past 48 hour(s))  GLUCOSE, CAPILLARY     Status: Abnormal   Collection Time    09/22/13 11:54 AM      Result Value Ref Range   Glucose-Capillary 126 (*) 70 - 99 mg/dL  GLUCOSE, CAPILLARY     Status: None    Collection Time    09/22/13  5:15 PM      Result Value Ref Range   Glucose-Capillary 88  70 - 99 mg/dL  GLUCOSE, CAPILLARY     Status: Abnormal   Collection Time    09/23/13 12:27 AM      Result Value Ref Range   Glucose-Capillary 110 (*) 70 - 99 mg/dL  MAGNESIUM     Status: None   Collection Time    09/23/13  4:24 AM      Result Value Ref Range   Magnesium SPECIMEN CONTAMINATED, UNABLE TO PERFORM TEST(S).  1.5 - 2.5 mg/dL   Comment: CORRECTED RESULTS CALLED TO:     PHILLIPS,C. RN AT 1696 09/23/13 BARFIELD,T     PLEASE DISREGARD ALL RESULTS     SEE RECOLLECT [id number]     CORRECTED ON 08/03 AT 1001: PREVIOUSLY REPORTED AS 2.1  PHOSPHORUS     Status: None   Collection Time    09/23/13  4:24 AM      Result Value Ref Range   Phosphorus SPECIMEN CONTAMINATED, UNABLE TO PERFORM TEST(S).  2.3 - 4.6 mg/dL   Comment: CORRECTED RESULTS CALLED TO:     PHILLIPS,C. RN AT 0175 09/23/13 BARFIELD,T  PLEASE DISREGARD ALL RESULTS     SEE RECOLLECT [id number]     CORRECTED ON 08/03 AT 1001: PREVIOUSLY REPORTED AS 6.2  CBC     Status: Abnormal   Collection Time    09/23/13  4:24 AM      Result Value Ref Range   WBC 10.3  4.0 - 10.5 K/uL   RBC 3.14 (*) 3.87 - 5.11 MIL/uL   Hemoglobin 8.4 (*) 12.0 - 15.0 g/dL   HCT 27.9 (*) 36.0 - 46.0 %   MCV 88.9  78.0 - 100.0 fL   MCH 26.8  26.0 - 34.0 pg   MCHC 30.1  30.0 - 36.0 g/dL   RDW 15.9 (*) 11.5 - 15.5 %   Platelets 200  150 - 400 K/uL  DIFFERENTIAL     Status: None   Collection Time    09/23/13  4:24 AM      Result Value Ref Range   Neutrophils Relative % 72  43 - 77 %   Neutro Abs 7.5  1.7 - 7.7 K/uL   Lymphocytes Relative 17  12 - 46 %   Lymphs Abs 1.7  0.7 - 4.0 K/uL   Monocytes Relative 9  3 - 12 %   Monocytes Absolute 0.9  0.1 - 1.0 K/uL   Eosinophils Relative 2  0 - 5 %   Eosinophils Absolute 0.2  0.0 - 0.7 K/uL   Basophils Relative 0  0 - 1 %   Basophils Absolute 0.0  0.0 - 0.1 K/uL  TRIGLYCERIDES     Status: None   Collection  Time    09/23/13  4:24 AM      Result Value Ref Range   Triglycerides SPECIMEN CONTAMINATED, UNABLE TO PERFORM TEST(S).  <150 mg/dL   Comment: CORRECTED RESULTS CALLED TO:     PHILLIPS,C. RN AT 0956 09/23/13 BARFIELD,T     PLEASE DISREGARD ALL RESULTS     CORRECTED ON 08/03 AT 0957: PREVIOUSLY REPORTED AS 382  GLUCOSE, CAPILLARY     Status: Abnormal   Collection Time    09/23/13  5:27 AM      Result Value Ref Range   Glucose-Capillary 136 (*) 70 - 99 mg/dL  BASIC METABOLIC PANEL     Status: Abnormal   Collection Time    09/23/13  6:00 AM      Result Value Ref Range   Sodium 134 (*) 137 - 147 mEq/L   Potassium 3.9  3.7 - 5.3 mEq/L   Chloride 95 (*) 96 - 112 mEq/L   CO2 31  19 - 32 mEq/L   Glucose, Bld 140 (*) 70 - 99 mg/dL   BUN 15  6 - 23 mg/dL   Creatinine, Ser 0.70  0.50 - 1.10 mg/dL   Calcium 8.5  8.4 - 10.5 mg/dL   GFR calc non Af Amer >90  >90 mL/min   GFR calc Af Amer >90  >90 mL/min   Comment: (NOTE)     The eGFR has been calculated using the CKD EPI equation.     This calculation has not been validated in all clinical situations.     eGFR's persistently <90 mL/min signify possible Chronic Kidney     Disease.   Anion gap 8  5 - 15  PHOSPHORUS     Status: None   Collection Time    09/23/13  8:35 AM      Result Value Ref Range   Phosphorus 3.3  2.3 - 4.6 mg/dL  Comment: RESULTS VERIFIED VIA RECOLLECT  MAGNESIUM     Status: None   Collection Time    09/23/13  8:35 AM      Result Value Ref Range   Magnesium 1.8  1.5 - 2.5 mg/dL   Comment: RESULTS VERIFIED VIA RECOLLECT  TRIGLYCERIDES     Status: None   Collection Time    09/23/13  8:35 AM      Result Value Ref Range   Triglycerides 69  <150 mg/dL   Comment: Performed at [facility]     Status: Abnormal   Collection Time    09/23/13 10:54 AM      Result Value Ref Range   Prealbumin 9.3 (*) 17.0 - 34.0 mg/dL   Comment: Performed at [place], CAPILLARY     Status:  Abnormal   Collection Time    09/23/13 12:11 PM      Result Value Ref Range   Glucose-Capillary 116 (*) 70 - 99 mg/dL  GLUCOSE, CAPILLARY     Status: Abnormal   Collection Time    09/23/13  6:33 PM      Result Value Ref Range   Glucose-Capillary 117 (*) 70 - 99 mg/dL  GLUCOSE, CAPILLARY     Status: Abnormal   Collection Time    09/23/13 11:43 PM      Result Value Ref Range   Glucose-Capillary 142 (*) 70 - 99 mg/dL  BASIC METABOLIC PANEL     Status: Abnormal   Collection Time    09/24/13  4:30 AM      Result Value Ref Range   Sodium 135 (*) 137 - 147 mEq/L   Potassium 4.4  3.7 - 5.3 mEq/L   Chloride 99  96 - 112 mEq/L   CO2 31  19 - 32 mEq/L   Glucose, Bld 144 (*) 70 - 99 mg/dL   BUN 13  6 - 23 mg/dL   Creatinine, Ser 0.64  0.50 - 1.10 mg/dL   Calcium 8.3 (*) 8.4 - 10.5 mg/dL   GFR calc non Af Amer >90  >90 mL/min   GFR calc Af Amer >90  >90 mL/min   Comment: (NOTE)     The eGFR has been calculated using the CKD EPI equation.     This calculation has not been validated in all clinical situations.     eGFR's persistently <90 mL/min signify possible Chronic Kidney     Disease.   Anion gap 5  5 - 15  PHOSPHORUS     Status: None   Collection Time    09/24/13  4:30 AM      Result Value Ref Range   Phosphorus 3.1  2.3 - 4.6 mg/dL  MAGNESIUM     Status: None   Collection Time    09/24/13  4:30 AM      Result Value Ref Range   Magnesium 1.8  1.5 - 2.5 mg/dL  GLUCOSE, CAPILLARY     Status: Abnormal   Collection Time    09/24/13  5:36 AM      Result Value Ref Range   Glucose-Capillary 113 (*) 70 - 99 mg/dL    Imaging / Studies: No results found.  Scheduled Meds: . enoxaparin (LOVENOX) injection  40 mg Subcutaneous Q24H  . feeding supplement (RESOURCE BREEZE)  1 Container Oral Q24H  . insulin aspart  0-9 Units Subcutaneous TID WC  . metoprolol tartrate  12.5 mg Oral BID  . multivitamin with minerals  1 tablet Oral Daily  . pantoprazole (PROTONIX) IV  40 mg Intravenous  Q12H   Continuous Infusions: . sodium chloride 35 mL/hr at 09/24/13 0218  . [place] KitchenTPN (CLINIMIX-E) Adult 80 mL/hr at 09/23/13 1722   And  . fat emulsion 250 mL (09/23/13 1721)  . [place] KitchenTPN (CLINIMIX-E) Adult     And  . fat emulsion     PRN Meds:.acetaminophen, acetaminophen, albuterol, ALPRAZolam, guaiFENesin-dextromethorphan, hydrALAZINE, HYDROmorphone (DILAUDID) injection, hydrOXYzine, metoCLOPramide (REGLAN) injection, metoprolol, ondansetron, oxyCODONE, promethazine, sodium chloride   Antibiotics: Anti-infectives   Start     Dose/Rate Route Frequency Ordered Stop   09/20/13 1915  cefOXitin (MEFOXIN) 1 g in dextrose 5 % 50 mL IVPB     1 g 100 mL/hr over 30 Minutes Intravenous Every 6 hours 09/20/13 1903 09/21/13 0703   09/20/13 0600  [MAR Hold]  cefOXitin (MEFOXIN) 2 g in dextrose 5 % 50 mL IVPB     (On MAR Hold since 09/20/13 1215)  Comments:  Pharmacy may adjust dosing strength, interval, or rate of medication as needed for optimal therapy for the patient Send with patient on call to the OR.  Anest[facility] to complete antibiotic administration <[facility]mn [facility]   2 g 100 mL/hr over 30 Minutes Intravenous On call to O.R. 09/19/13 1119 09/20/13 1315       Assessment/Plan  Gastric adenocarcinoma  S/p diagnostic laparoscopy, distal gastrectomy with BII anastamosis and feeding GJ tube---Dr. BBarry Dienes7/31/15  POD#4  -continue with clears and await bowel function  -continue with TPN  -will remove ON-Q -c/w oxycodone, may need increased range -mobilize to help recovery  -GJ tube clamp  -continue JP drain -check UA  EErby Pian AWashington [facility] IncSurgery Pager [phone] [phone] 09/24/2013 11:32 AM
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
## 2013-09-24 NOTE — Progress Notes (Signed)
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 Educated on Secondary school teacher use for activities PRN.    Follow Up Recommendations  Home health OT;Supervision/Assistance - 24 hour    Equipment Recommendations  3 in 1 bedside comode    Recommendations for Other Services      Precautions / Restrictions Precautions Precautions: Fall Precaution Comments: JP drain Restrictions Weight Bearing Restrictions: No       Mobility Bed Mobility               General bed mobility comments: in bathroom washing on 3in1. Nursing tech assisted pt to bathroom.  Transfers Overall transfer level: Needs assistance Equipment used: Rolling walker (2 wheeled) Transfers: Sit to/from Stand Sit to Stand: Min guard         General transfer comment: verbal cues for hand placement.    Balance                                   ADL                       Lower Body Dressing: Min guard;Sit to/from stand   Toilet Transfer: Minimal assistance;Comfort height toilet;Grab bars Toilet Transfer Details (indicate cue type and reason): pt stood from 3in1 at sink where she was bathing and  pivot around to comfort height commode with min assist. Pt states she feels 3in1 will be helpful to use armrests and not sit as low.  Toileting- Water quality scientist and Hygiene: Min guard;Sit to/from stand         General ADL Comments: Educated on reacher option for LB dressing though pt can cross Les up today to don mesh underwear and socks today well. Educated on how she can use  reacher to retrieve objects or adjust covers, etc. And explained where to obtain a reacher and coverage. Pt states she will get family to obtain one in gift shop. She used walker to transfer out of bathroom to recliner.       Vision                     Perception     Praxis      Cognition   Behavior During Therapy: WFL for tasks assessed/performed Overall Cognitive Status: Within Functional Limits for tasks assessed                       Extremity/Trunk Assessment               Exercises     Shoulder Instructions       General Comments      Pertinent Vitals/ Pain       310 abdominal; reposition, rest  Home Living  Prior Functioning/Environment              Frequency Min 2X/week     Progress Toward Goals  OT Goals(current goals can now be found in the care plan section)  Progress towards OT goals: Progressing toward goals     Plan Discharge plan remains appropriate    Co-evaluation                 End of Session Equipment Utilized During Treatment: Rolling walker   Activity Tolerance Patient tolerated treatment well   Patient Left in chair;with call bell/phone within reach   Nurse Communication          Time: [id number] OT Time Calculation (min): 32 min  Charges: OT General Charges $OT Visit: 1 Procedure OT Treatments $Self Care/Home Management : 8-22 mins $Therapeutic Activity: 8-22 mins  Jules Schick [phone] 09/24/2013, 10:18 AM

## 2013-09-24 NOTE — Progress Notes (Signed)
PARENTERAL NUTRITION CONSULT NOTE - Follow Up  Pharmacy Consult for TNA Indication:  Adenocarcinoma -gastric, unable to tolerate PO intake  No Known Allergies  Patient Measurements: Height: 5\' 5"  (165.1 cm) Weight: 156 lb 1.4 oz (70.8 kg) IBW/kg (Calculated) : 57 Adjusted Body Weight: 62 3-7 lb weight loss per RD notes  Vital Signs: Temp: 97.7 F (36.5 C) (08/04 0538) Temp src: Oral (08/04 0538) BP: 141/81 mmHg (08/04 0538) Pulse Rate: 129 (08/04 0538) Intake/Output from previous day: 08/03 0701 - 08/04 0700 In: 1165 [P.O.:360; I.V.:805] Out: 1595 [Urine:1500; Drains:95] Intake/Output from this shift:    Labs:  Recent Labs  09/22/13 0345 09/23/13 0424  WBC 12.1* 10.3  HGB 9.0* 8.4*  HCT 28.8* 27.9*  PLT 230 200     Recent Labs  09/22/13 0345 09/23/13 0424 09/23/13 0600 09/23/13 0835 09/23/13 1054 09/24/13 0430  NA 136*  --  134*  --   --  135*  K 3.4*  --  3.9  --   --  4.4  CL 96  --  95*  --   --  99  CO2 32  --  31  --   --  31  GLUCOSE 136*  --  140*  --   --  144*  BUN 13  --  15  --   --  13  CREATININE 0.65  --  0.70  --   --  0.64  CALCIUM 8.2*  --  8.5  --   --  8.3*  MG  --  SPECIMEN CONTAMINATED, UNABLE TO PERFORM TEST(S).  --  1.8  --  1.8  PHOS  --  SPECIMEN CONTAMINATED, UNABLE TO PERFORM TEST(S).  --  3.3  --  3.1  PREALBUMIN  --   --   --   --  9.3*  --   TRIG  --  SPECIMEN CONTAMINATED, UNABLE TO PERFORM TEST(S).  --  69  --  75   Estimated Creatinine Clearance: 86.7 ml/min (by C-G formula based on Cr of 0.64).    Recent Labs  09/23/13 1833 09/23/13 2343 09/24/13 0536  GLUCAP 117* 142* 113*    Medical History: Past Medical History  Diagnosis Date  . Headache(784.0) 2011    Migraines  . Esophageal reflux     On omeprazole  . Costochondritis   . Hypertension     started around 2011  . Ovarian cyst     Medications:  Scheduled:  . enoxaparin (LOVENOX) injection  40 mg Subcutaneous Q24H  . feeding supplement (RESOURCE  BREEZE)  1 Container Oral Q24H  . insulin aspart  0-9 Units Subcutaneous 4 times per day  . metoprolol tartrate  12.5 mg Oral BID  . multivitamin with minerals  1 tablet Oral Daily  . pantoprazole (PROTONIX) IV  40 mg Intravenous Q12H   Infusions:  . sodium chloride 35 mL/hr at 09/24/13 0218  . Marland KitchenTPN (CLINIMIX-E) Adult 80 mL/hr at 09/23/13 1722   And  . fat emulsion 250 mL (09/23/13 1721)    Insulin Requirements in the past 24 hours:  1 unit, sens SSI  Current Nutrition:  CL - not much intake  Nutritional Goals:   Per RD assessment, 1800-2000 kCal,  85-95 grams of protein per day  Clinimix 5/15 at goal rate of 80 ml/hr + lipid to provide 96g protein and 1843 kcal/day  IVF:  NS @ 35 ml/hr  Lines:  PICC placed 7/30  Assessment: 48 yo female with newly diagnosed poorly differentiated gastric  adenocarcinoma with plan per CCS for massive bowel resection scheduled for 7/31. To place PICC line and go ahead and start TNA per pharmacy dosing this evening if possible.  8/4: D#6 TNA.  POD4 for distal gastrectomy, feeding GJT placement.  Ileus continues post-op. Per [facility]CS, plan continue TNA, clear liquid diet and await bowel function, cont JP drain  Lytes: Na slightly low but everything else WNL , phos was high with early AM labs so repeated lab and was WNL on second draw Renal: stable, CrCl 87 ml/min Hepatic: WNL CBGs: stable < 150 TGs: 101 (7/31), 75 (8/4) Prealb: 12.7 (7/31), pending 8/4  Plan:  At 1800 tonight: 1) Continue Clinimix E5/15 at goal rate of 80 ml/hr and continue lipids at rate of 10 ml/hr with trace elements daily 2) Receiving MVI as PO daily (first dose 8/3) - tolerated first dose well 3) Mon/Thurs TNA labs 4) Continue IV fluids at 35 ml/hr 5) Await bowel function return   Adrian Saran, PharmD, BCPS Pager [phone] 09/24/2013 10:36 AM

## 2013-09-25 DIAGNOSIS — F4310 Post-traumatic stress disorder, unspecified: Secondary | ICD-10-CM

## 2013-09-25 LAB — GLUCOSE, CAPILLARY
GLUCOSE-CAPILLARY: 154 mg/dL — AB (ref 70–99)
GLUCOSE-CAPILLARY: 109 mg/dL — AB (ref 70–99)
Glucose-Capillary: 133 mg/dL — ABNORMAL HIGH (ref 70–99)

## 2013-09-25 LAB — CBC
HCT: 25.1 % — ABNORMAL LOW (ref 36.0–46.0)
HEMOGLOBIN: 7.8 g/dL — AB (ref 12.0–15.0)
MCH: 26.2 pg (ref 26.0–34.0)
MCHC: 31.1 g/dL (ref 30.0–36.0)
MCV: 84.2 fL (ref 78.0–100.0)
Platelets: 253 10*3/uL (ref 150–400)
RBC: 2.98 MIL/uL — AB (ref 3.87–5.11)
RDW: 15.1 % (ref 11.5–15.5)
WBC: 7.3 10*3/uL (ref 4.0–10.5)

## 2013-09-25 MED ORDER — OXYCODONE HCL 5 MG PO TABS
10.0000 mg | ORAL_TABLET | ORAL | Status: DC | PRN
  Administered 2013-09-25 – 2013-09-26 (×4): 20 mg via ORAL
  Administered 2013-09-29: 10 mg via ORAL
  Filled 2013-09-25 (×2): qty 4
  Filled 2013-09-25: qty 2
  Filled 2013-09-25 (×2): qty 4

## 2013-09-25 MED ORDER — TRACE MINERALS CR-CU-F-FE-I-MN-MO-SE-ZN IV SOLN
INTRAVENOUS | Status: AC
  Administered 2013-09-25: 18:00:00 via INTRAVENOUS
  Filled 2013-09-25: qty 2000

## 2013-09-25 MED ORDER — MAGNESIUM SULFATE 40 MG/ML IJ SOLN
2.0000 g | Freq: Once | INTRAMUSCULAR | Status: AC
  Administered 2013-09-25: 2 g via INTRAVENOUS
  Filled 2013-09-25: qty 50

## 2013-09-25 MED ORDER — FAT EMULSION 20 % IV EMUL
250.0000 mL | INTRAVENOUS | Status: AC
  Administered 2013-09-25: 250 mL via INTRAVENOUS
  Filled 2013-09-25: qty 250

## 2013-09-25 MED ORDER — TRAMADOL HCL 50 MG PO TABS
50.0000 mg | ORAL_TABLET | Freq: Four times a day (QID) | ORAL | Status: DC
  Administered 2013-09-25 – 2013-10-09 (×27): 50 mg via ORAL
  Filled 2013-09-25 (×30): qty 1

## 2013-09-25 NOTE — Progress Notes (Signed)
PRN benedryl given per patient request for itching.
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
## 2013-09-25 NOTE — Progress Notes (Signed)
PARENTERAL NUTRITION CONSULT NOTE - Follow Up  Pharmacy Consult for TNA Indication:  Adenocarcinoma -gastric, unable to tolerate PO intake  No Known Allergies  Patient Measurements: Height: 5\' 5"  (165.1 cm) Weight: 156 lb 1.4 oz (70.8 kg) IBW/kg (Calculated) : 57 Adjusted Body Weight: 62 3-7 lb weight loss per RD notes   Vital Signs: Temp: 98.1 F (36.7 C) (08/05 0629) Temp src: Oral (08/05 0629) BP: 97/65 mmHg (08/05 0629) Pulse Rate: 116 (08/05 0629) Intake/Output from previous day: 08/04 0701 - 08/05 0700 In: 3080.2 [P.O.:1650; I.V.:728.2; TPN:640] Out: 750 [Urine:750] Intake/Output from this shift: Total I/O In: -  Out: 800 [Urine:800]  Labs:  Recent Labs  09/23/13 0424 09/25/13 0410  WBC 10.3 7.3  HGB 8.4* 7.8*  HCT 27.9* 25.1*  PLT 200 253     Recent Labs  09/23/13 0424 09/23/13 0600 09/23/13 0835 09/23/13 1054 09/24/13 0430  NA  --  134*  --   --  135*  K  --  3.9  --   --  4.4  CL  --  95*  --   --  99  CO2  --  31  --   --  31  GLUCOSE  --  140*  --   --  144*  BUN  --  15  --   --  13  CREATININE  --  0.70  --   --  0.64  CALCIUM  --  8.5  --   --  8.3*  MG SPECIMEN CONTAMINATED, UNABLE TO PERFORM TEST(S).  --  1.8  --  1.8  PHOS SPECIMEN CONTAMINATED, UNABLE TO PERFORM TEST(S).  --  3.3  --  3.1  PREALBUMIN  --   --   --  9.3* 8.8*  TRIG SPECIMEN CONTAMINATED, UNABLE TO PERFORM TEST(S).  --  69  --  75   Estimated Creatinine Clearance: 86.7 ml/min (by C-G formula based on Cr of 0.64).    Recent Labs  09/24/13 1151 09/24/13 1716 09/25/13 0748  GLUCAP 149* 145* 154*    Medical History: Past Medical History  Diagnosis Date  . Headache(784.0) 2011    Migraines  . Esophageal reflux     On omeprazole  . Costochondritis   . Hypertension     started around 2011  . Ovarian cyst     Medications:  Scheduled:  . enoxaparin (LOVENOX) injection  40 mg Subcutaneous Q24H  . feeding supplement (RESOURCE BREEZE)  1 Container Oral Q24H   . insulin aspart  0-9 Units Subcutaneous TID WC  . metoprolol tartrate  25 mg Oral BID  . multivitamin with minerals  1 tablet Oral Daily  . pantoprazole (PROTONIX) IV  40 mg Intravenous Q12H  . traMADol  50 mg Oral 4 times per day   Infusions:  . sodium chloride 35 mL/hr at 09/24/13 0218  . Marland KitchenTPN (CLINIMIX-E) Adult 80 mL/hr at 09/24/13 1719   And  . fat emulsion 80 kcal (09/25/13 0600)    Insulin Requirements in the past 24 hours:  4 unit, sens SSI  Current Nutrition:  CL - intake improving  Nutritional Goals:   Per RD assessment, 1800-2000 kCal,  85-95 grams of protein per day  Clinimix 5/15 at goal rate of 80 ml/hr + lipid to provide 96g protein and 1843 kcal/day  IVF:  NS @ 35 ml/hr  Lines:  PICC placed 7/30  Assessment: 48 yo female with newly diagnosed poorly differentiated gastric adenocarcinoma, s/p distal gastrectomy/GJ 7/31.  8/5: D#7 TNA.  POD4 for distal gastrectomy, feeding GJT placement.  Ileus continues post-op. Per CCS, plan continue TNA, clear liquid diet and await bowel function, cont JP drain  Lytes: Na slightly low but everything else WNL(8/4), no new labs today Renal: stable, CrCl 87 ml/min Hepatic: WNL (8/4) CBGs: stable < 150 except one reading TGs: 101 (7/31), 75 (8/4) Prealb: 12.7 (7/31), 9.3 (8/3), 8.8 (8/4)  Plan:  At 1800 tonight: 1) Continue Clinimix E5/15 at goal rate of 80 ml/hr and continue lipids at rate of 10 ml/hr with trace elements daily 2) Receiving MVI as PO daily 3) Mon/Thurs TNA labs 4) Continue IV fluids at 35 ml/hr 5) Await bowel function return 6) Bmet in am   Kizzie Furnish, PharmD Pager: [phone] 09/25/2013 9:54 AM

## 2013-09-25 NOTE — Progress Notes (Signed)
Patient ID: Paula [name], female   DOB: 08-May-1965, 48 y.o.   MRN: [id number]     [facility]      [address] [place], [place] [address]    Phone: [phone] FAX: [phone]     Subjective: Pain remains an issue.  Unfortunately, she only walked once yesterday.  No flatus yet.  Still with "queeziness" feelnig.   Objective:  Vital signs:  Filed Vitals:   09/24/13 0538 09/24/13 1908 09/24/13 2127 09/25/13 0629  BP: 141/81 129/80 137/81 97/65  Pulse: 129 113 116 116  Temp: 97.7 F (36.5 C) 98.2 F (36.8 C) 98.4 F (36.9 C) 98.1 F (36.7 C)  TempSrc: Oral Oral Oral Oral  Resp: _0 Height:      Weight:      SpO2: 96% 92% 92% 99%    Last BM Date: 09/19/13  Intake/Output   Yesterday:  08/04 0701 - 08/05 0700 In: 3080.2 [P.O.:1650; I.V.:728.2; TPN:640] Out: 750 [Urine:750] This shift:  Total I/O In: -  Out: 800 [Urine:800]  Bowel function:  Flatus: none  BM: no  Drain: RUQ--no output recorded yesterday, approximately 25cc of serosanguinous output in bulb now.    Physical Exam:  General: Pt awake/alert/oriented x4 in no acute distress  CV: s1s2 RRR, tachy.  No edema.  +2 DP Abdomen: Soft. Nondistended. +bs. appropriately tender. RUQ drain with serosanguinous output. Midline wound is c/d/i. No evidence of peritonitis. No incarcerated hernias.   Problem List:   Principal Problem:   Gastric carcinoma s/p Distal gastrectomy/GJ (Billroth II) 09/20/2013 Active Problems:   Chest pain   Essential hypertension   Migraine headache   Intractable vomiting   Antral ulcer   Malnutrition of moderate degree    Results:   Labs: Results for orders placed during the hospital encounter of 09/14/13 (from the past 48 hour(s))  PHOSPHORUS     Status: None   Collection Time    09/23/13  8:35 AM      Result Value Ref Range   Phosphorus 3.3  2.3 - 4.6 mg/dL   Comment: RESULTS VERIFIED VIA RECOLLECT   MAGNESIUM     Status: None   Collection Time    09/23/13  8:35 AM      Result Value Ref Range   Magnesium 1.8  1.5 - 2.5 mg/dL   Comment: RESULTS VERIFIED VIA RECOLLECT  TRIGLYCERIDES     Status: None   Collection Time    09/23/13  8:35 AM      Result Value Ref Range   Triglycerides 69  <150 mg/dL   Comment: Performed at [facility]     Status: Abnormal   Collection Time    09/23/13 10:54 AM      Result Value Ref Range   Prealbumin 9.3 (*) 17.0 - 34.0 mg/dL   Comment: Performed at [place], CAPILLARY     Status: Abnormal   Collection Time    09/23/13 12:11 PM      Result Value Ref Range   Glucose-Capillary 116 (*) 70 - 99 mg/dL  GLUCOSE, CAPILLARY     Status: Abnormal   Collection Time    09/23/13  6:33 PM      Result Value Ref Range   Glucose-Capillary 117 (*) 70 - 99 mg/dL  GLUCOSE, CAPILLARY     Status: Abnormal   Collection Time    09/23/13 11:43 PM  Result Value Ref Range   Glucose-Capillary 142 (*) 70 - 99 mg/dL  TRIGLYCERIDES     Status: None   Collection Time    09/24/13  4:30 AM      Result Value Ref Range   Triglycerides 75  <150 mg/dL   Comment: Performed at [facility] PANEL     Status: Abnormal   Collection Time    09/24/13  4:30 AM      Result Value Ref Range   Sodium 135 (*) 137 - 147 mEq/L   Potassium 4.4  3.7 - 5.3 mEq/L   Chloride 99  96 - 112 mEq/L   CO2 31  19 - 32 mEq/L   Glucose, Bld 144 (*) 70 - 99 mg/dL   BUN 13  6 - 23 mg/dL   Creatinine, Ser 0.64  0.50 - 1.10 mg/dL   Calcium 8.3 (*) 8.4 - 10.5 mg/dL   GFR calc non Af Amer >90  >90 mL/min   GFR calc Af Amer >90  >90 mL/min   Comment: (NOTE)     The eGFR has been calculated using the CKD EPI equation.     This calculation has not been validated in all clinical situations.     eGFR's persistently <90 mL/min signify possible Chronic Kidney     Disease.   Anion gap 5  5 - 15  PHOSPHORUS     Status: None   Collection  Time    09/24/13  4:30 AM      Result Value Ref Range   Phosphorus 3.1  2.3 - 4.6 mg/dL  MAGNESIUM     Status: None   Collection Time    09/24/13  4:30 AM      Result Value Ref Range   Magnesium 1.8  1.5 - 2.5 mg/dL  PREALBUMIN     Status: Abnormal   Collection Time    09/24/13  4:30 AM      Result Value Ref Range   Prealbumin 8.8 (*) 17.0 - 34.0 mg/dL   Comment: Performed at [place], CAPILLARY     Status: Abnormal   Collection Time    09/24/13  5:36 AM      Result Value Ref Range   Glucose-Capillary 113 (*) 70 - 99 mg/dL  URINALYSIS, ROUTINE W REFLEX MICROSCOPIC     Status: None   Collection Time    09/24/13 10:30 AM      Result Value Ref Range   Color, Urine YELLOW  YELLOW   APPearance CLEAR  CLEAR   Specific Gravity, Urine 1.012  1.005 - 1.030   pH 5.5  5.0 - 8.0   Glucose, UA NEGATIVE  NEGATIVE mg/dL   Hgb urine dipstick NEGATIVE  NEGATIVE   Bilirubin Urine NEGATIVE  NEGATIVE   Ketones, ur NEGATIVE  NEGATIVE mg/dL   Protein, ur NEGATIVE  NEGATIVE mg/dL   Urobilinogen, UA 0.2  0.0 - 1.0 mg/dL   Nitrite NEGATIVE  NEGATIVE   Leukocytes, UA NEGATIVE  NEGATIVE   Comment: MICROSCOPIC NOT DONE ON URINES WITH NEGATIVE PROTEIN, BLOOD, LEUKOCYTES, NITRITE, OR GLUCOSE <1000 mg/dL.  GLUCOSE, CAPILLARY     Status: Abnormal   Collection Time    09/24/13 11:51 AM      Result Value Ref Range   Glucose-Capillary 149 (*) 70 - 99 mg/dL  GLUCOSE, CAPILLARY     Status: Abnormal   Collection Time    09/24/13  5:16 PM  Result Value Ref Range   Glucose-Capillary 145 (*) 70 - 99 mg/dL  CBC     Status: Abnormal   Collection Time    09/25/13  4:10 AM      Result Value Ref Range   WBC 7.3  4.0 - 10.5 K/uL   RBC 2.98 (*) 3.87 - 5.11 MIL/uL   Hemoglobin 7.8 (*) 12.0 - 15.0 g/dL   HCT 25.1 (*) 36.0 - 46.0 %   MCV 84.2  78.0 - 100.0 fL   MCH 26.2  26.0 - 34.0 pg   MCHC 31.1  30.0 - 36.0 g/dL   RDW 15.1  11.5 - 15.5 %   Platelets 253  150 - 400 K/uL  GLUCOSE,  CAPILLARY     Status: Abnormal   Collection Time    09/25/13  7:48 AM      Result Value Ref Range   Glucose-Capillary 154 (*) 70 - 99 mg/dL    Imaging / Studies: No results found.  Medications / Allergies:  Scheduled Meds: . enoxaparin (LOVENOX) injection  40 mg Subcutaneous Q24H  . feeding supplement (RESOURCE BREEZE)  1 Container Oral Q24H  . insulin aspart  0-9 Units Subcutaneous TID WC  . metoprolol tartrate  25 mg Oral BID  . multivitamin with minerals  1 tablet Oral Daily  . pantoprazole (PROTONIX) IV  40 mg Intravenous Q12H  . traMADol  50 mg Oral 4 times per day   Continuous Infusions: . sodium chloride 35 mL/hr at 09/24/13 0218  . Marland KitchenTPN (CLINIMIX-E) Adult 80 mL/hr at 09/24/13 1719   And  . fat emulsion 80 kcal (09/25/13 0600)   PRN Meds:.acetaminophen, acetaminophen, albuterol, ALPRAZolam, diphenhydrAMINE, guaiFENesin-dextromethorphan, hydrALAZINE, HYDROmorphone (DILAUDID) injection, methocarbamol, metoCLOPramide (REGLAN) injection, metoprolol, ondansetron, oxyCODONE, promethazine, sodium chloride  Antibiotics: Anti-infectives   Start     Dose/Rate Route Frequency Ordered Stop   09/20/13 1915  cefOXitin (MEFOXIN) 1 g in dextrose 5 % 50 mL IVPB     1 g 100 mL/hr over 30 Minutes Intravenous Every 6 hours 09/20/13 1903 09/21/13 0703   09/20/13 0600  [MAR Hold]  cefOXitin (MEFOXIN) 2 g in dextrose 5 % 50 mL IVPB     (On MAR Hold since 09/20/13 1215)  Comments:  Pharmacy may adjust dosing strength, interval, or rate of medication as needed for optimal therapy for the patient Send with patient on call to the OR.  Anesthesia to complete antibiotic administration <3mn prior [facility]   2 g 100 mL/hr over 30 Minutes Intravenous On call to O.R. 09/19/13 1119 09/20/13 1315       Assessment/Plan  Gastric adenocarcinoma  S/p diagnostic laparoscopy, distal gastrectomy with BII anastamosis and feeding GJ tube---Dr. BBarry Dienes7/31/15  POD#5  -continue with  clears and await bowel function  -continue with TPN  -c/w oxycodone, may need increased range  -mobilize to help recovery!!  -GJ tube clamp  -continue JP drain  -robaxin, add tramadol scheduled to help with pain.  Discussed goals of pain meds. -final pathology will be resulted tomorrow morning per lab   EErby Pian ASac[facility] CenterSurgery Pager [phone] [phone] 09/25/2013 8:17 AM

## 2013-09-26 ENCOUNTER — Inpatient Hospital Stay (HOSPITAL_COMMUNITY): Payer: No Typology Code available for payment source

## 2013-09-26 LAB — COMPREHENSIVE METABOLIC PANEL
ALT: 23 U/L (ref 0–35)
ANION GAP: 6 (ref 5–15)
AST: 19 U/L (ref 0–37)
Albumin: 2.7 g/dL — ABNORMAL LOW (ref 3.5–5.2)
Alkaline Phosphatase: 74 U/L (ref 39–117)
BUN: 14 mg/dL (ref 6–23)
CO2: 32 meq/L (ref 19–32)
CREATININE: 0.73 mg/dL (ref 0.50–1.10)
Calcium: 8.8 mg/dL (ref 8.4–10.5)
Chloride: 97 mEq/L (ref 96–112)
GFR calc Af Amer: >90 mL/min (ref >90)
Glucose, Bld: 128 mg/dL — ABNORMAL HIGH (ref 70–99)
Potassium: 4.6 mEq/L (ref 3.7–5.3)
Sodium: 135 mEq/L — ABNORMAL LOW (ref 137–147)
Total Bilirubin: <0.2 mg/dL — ABNORMAL LOW (ref 0.3–1.2)
Total Protein: 6.2 g/dL (ref 6.0–8.3)

## 2013-09-26 LAB — GLUCOSE, CAPILLARY
GLUCOSE-CAPILLARY: 129 mg/dL — AB (ref 70–99)
Glucose-Capillary: 126 mg/dL — ABNORMAL HIGH (ref 70–99)
Glucose-Capillary: 114 mg/dL — ABNORMAL HIGH (ref 70–99)

## 2013-09-26 LAB — CBC
HCT: 25.1 % — ABNORMAL LOW (ref 36.0–46.0)
Hemoglobin: 7.9 g/dL — ABNORMAL LOW (ref 12.0–15.0)
MCH: 26.5 pg (ref 26.0–34.0)
MCHC: 31.5 g/dL (ref 30.0–36.0)
MCV: 84.2 fL (ref 78.0–100.0)
PLATELETS: 281 10*3/uL (ref 150–400)
RBC: 2.98 MIL/uL — ABNORMAL LOW (ref 3.87–5.11)
RDW: 15.1 % (ref 11.5–15.5)
WBC: 7.8 10*3/uL (ref 4.0–10.5)

## 2013-09-26 LAB — PHOSPHORUS: Phosphorus: 4.2 mg/dL (ref 2.3–4.6)

## 2013-09-26 LAB — MAGNESIUM: MAGNESIUM: 2.2 mg/dL (ref 1.5–2.5)

## 2013-09-26 MED ORDER — FAT EMULSION 20 % IV EMUL
250.0000 mL | INTRAVENOUS | Status: AC
  Administered 2013-09-26: 250 mL via INTRAVENOUS
  Filled 2013-09-26: qty 250

## 2013-09-26 MED ORDER — POLYETHYLENE GLYCOL 3350 17 G PO PACK
17.0000 g | PACK | Freq: Every day | ORAL | Status: DC
  Administered 2013-09-26 – 2013-10-01 (×5): 17 g via ORAL
  Filled 2013-09-26 (×6): qty 1

## 2013-09-26 MED ORDER — CLINIMIX E/DEXTROSE (5/15) 5 % IV SOLN
INTRAVENOUS | Status: AC
  Administered 2013-09-26: 18:00:00 via INTRAVENOUS
  Filled 2013-09-26: qty 2000

## 2013-09-26 NOTE — Progress Notes (Signed)
UR COMPLETED  

## 2013-09-26 NOTE — Progress Notes (Signed)
I saw the patient briefly this evening and discussed with her the final pathology report. I explained to her that there is evidence of metastases to the lymph nodes and likely she has at least Stage III gastric cancer. She would definitely need discussion of Adjuvant treatment including Chemotherapy and XRT. I will arrange for her Follow up appointment with me at the [facility] 1-2 weeks after discharge for more detailed discussion of her treatment.

## 2013-09-26 NOTE — Progress Notes (Signed)
Was called into patient room, and noted patient very upset and talking with family on phone. She had just gotten word that her brother was killed in a motorcycle accident.  Patient given emotional support and called a chaplain to come in and speak with her. Will monitor patient and support her in what ever means necessary.

## 2013-09-26 NOTE — Progress Notes (Signed)
PARENTERAL NUTRITION CONSULT NOTE - Follow Up  Pharmacy Consult for TNA Indication:  Adenocarcinoma -gastric, unable to tolerate PO intake  No Known Allergies  Patient Measurements: Height: 5\' 5"  (165.1 cm) Weight: 156 lb 1.4 oz (70.8 kg) IBW/kg (Calculated) : 57 Adjusted Body Weight: 62 3-7 lb weight loss per RD notes   Vital Signs: Temp: 97.7 F (36.5 C) (08/06 0616) Temp src: Oral (08/06 0616) BP: 107/72 mmHg (08/06 0616) Pulse Rate: 110 (08/06 0616) Intake/Output from previous day: 08/05 0701 - 08/06 0700 In: 2330 [P.O.:900; I.V.:840; TPN:560] Out: 2550 [Urine:2550] Intake/Output from this shift: Total I/O In: 240 [P.O.:240] Out: 320 [Urine:300; Drains:20]  Labs:  Recent Labs  09/25/13 0410 09/26/13 0350  WBC 7.3 7.8  HGB 7.8* 7.9*  HCT 25.1* 25.1*  PLT 253 281     Recent Labs  09/23/13 1054 09/24/13 0430 09/26/13 0350  NA  --  135* 135*  K  --  4.4 4.6  CL  --  99 97  CO2  --  31 32  GLUCOSE  --  144* 128*  BUN  --  13 14  CREATININE  --  0.64 0.73  CALCIUM  --  8.3* 8.8  MG  --  1.8 2.2  PHOS  --  3.1 4.2  PROT  --   --  6.2  ALBUMIN  --   --  2.7*  AST  --   --  19  ALT  --   --  23  ALKPHOS  --   --  74  BILITOT  --   --  <0.2*  PREALBUMIN 9.3* 8.8*  --   TRIG  --  75  --    Estimated Creatinine Clearance: 86.7 ml/min (by C-G formula based on Cr of 0.73).    Recent Labs  09/25/13 1128 09/25/13 1649 09/26/13 0755  GLUCAP 133* 109* 129*    Medical History: Past Medical History  Diagnosis Date  . Headache(784.0) 2011    Migraines  . Esophageal reflux     On omeprazole  . Costochondritis   . Hypertension     started around 2011  . Ovarian cyst     Medications:  Scheduled:  . enoxaparin (LOVENOX) injection  40 mg Subcutaneous Q24H  . feeding supplement (RESOURCE BREEZE)  1 Container Oral Q24H  . insulin aspart  0-9 Units Subcutaneous TID WC  . metoprolol tartrate  25 mg Oral BID  . multivitamin with minerals  1 tablet  Oral Daily  . pantoprazole (PROTONIX) IV  40 mg Intravenous Q12H  . traMADol  50 mg Oral 4 times per day   Infusions:  . sodium chloride 35 mL/hr at 09/26/13 0609  . Marland KitchenTPN (CLINIMIX-E) Adult 80 mL/hr at 09/25/13 1738   And  . fat emulsion 250 mL (09/25/13 1739)    Insulin Requirements in the past 24 hours:  1 unit, sens SSI  Current Nutrition:  CL - poor intake  Nutritional Goals:   Per RD assessment, 1800-2000 kCal,  85-95 grams of protein per day  Clinimix 5/15 at goal rate of 80 ml/hr + lipid to provide 96g protein and 1843 kcal/day  IVF:  NS @ 35 ml/hr  Lines:  PICC placed 7/30  Assessment: 48 yo female with newly diagnosed poorly differentiated gastric adenocarcinoma, s/p distal gastrectomy/GJ 7/31.  8/6: D#8 TNA.  POD4 for distal gastrectomy, feeding GJT placement.  Ileus continues post-op. Per CCS, plan continue TNA, clear liquid diet and await bowel function, cont JP drain  Lytes: Na slightly low all other lytes WNL Renal: stable, CrCl 87 ml/min Hepatic: WNL CBGs: stable < 150  TGs: 101 (7/31), 75 (8/4) Prealb: 12.7 (7/31), 9.3 (8/3), 8.8 (8/4)  Plan:  At 1800 tonight: 1) Continue Clinimix E5/15 at goal rate of 80 ml/hr and continue lipids at rate of 10 ml/hr with trace elements daily 2) Receiving MVI and trace elements as PO daily 3) Mon/Thurs TNA labs 4) Continue IV fluids at 35 ml/hr 5) Await bowel function return   Kizzie Furnish, PharmD Pager: [phone] 09/26/2013 9:59 AM

## 2013-09-26 NOTE — Progress Notes (Signed)
Physical Therapy Treatment Patient Details Name: Paula [name] MRN: [id number] DOB: 09-Apr-1965 Today's Date: 09/26/2013    History of Present Illness 48 yo female adm  09/14/13 with Gastric carcinoma, s/p Distal gastrectomy 09/20/13; PMHx: costochondritis;         -- MRI shows resolving SDH; CT neg for PE, small infiltrate positive for R sided pna    PT Comments    Assited pt OOB to amb to BR then in hallway.  Increased c/o ABD pain this afternoon.  Emotionally upset as she just found out her brother was killed while ride his motorcycle in [place].  Pt agreed to walk as she did not want to be left alone.  Chaplain arrived just I we were finishing. Positioned in recliner.  Follow Up Recommendations  Home health PT     Equipment Recommendations  Rolling walker with 5" wheels    Recommendations for Other Services       Precautions / Restrictions Precautions Precautions: Fall Precaution Comments: JP drain/liquid diet Restrictions Weight Bearing Restrictions: No    Mobility  Bed Mobility Overal bed mobility: Needs Assistance       Supine to sit: HOB elevated;Min guard     General bed mobility comments: increasesd time due to ABD discomfort     Transfers Overall transfer level: Needs assistance Equipment used: Rolling walker (2 wheeled) Transfers: Sit to/from Stand Sit to Stand: Min guard;Supervision         General transfer comment: minguard for safety due to multiple lines/tubes plus increased time.  Ambulation/Gait Ambulation/Gait assistance: Min guard;Min assist Ambulation Distance (Feet): 450 Feet Assistive device: Rolling walker (2 wheeled);None Gait Pattern/deviations: Step-to pattern Gait velocity: decreased   General Gait Details: one sitting rest break.  Pt tolerated amb full unit with increased time.     Stairs            Wheelchair Mobility    Modified Rankin (Stroke Patients Only)       Balance                                    Cognition                            Exercises      General Comments        Pertinent Vitals/Pain      Home Living                      Prior Function            PT Goals (current goals can now be found in the care plan section) Progress towards PT goals: Progressing toward goals    Frequency  Min 3X/week    PT Plan      Co-evaluation             End of Session Equipment Utilized During Treatment: Gait belt Activity Tolerance: Patient limited by fatigue Patient left: in chair;with call bell/phone within reach     Time: 1430-1455 PT Time Calculation (min): 25 min  Charges:  $Gait Training: 8-22 mins $Therapeutic Activity: 8-22 mins                    G Codes:      Rica Koyanagi  PTA [facility]  Acute  Rehab Pager      [phone]

## 2013-09-26 NOTE — Progress Notes (Signed)
Was paged to pt room. Pt's brother was killed in motorcycle accident today. Also learned 20 years ago today another brother of pt was also killed in motorcycle accident, still another brother has a birthday on today. Pt said she feels a heaviness; pt said she is at a loss for words; pt said she didn't know her brother's condition spiritually but didn't know what he may have prayed this morning. Pt also shared her recent diagnosis with stomach cancer. Pt continued to say how she will miss her brother even though she knows God will take care of her. We talked about grief and it is a step in life that we can't skip when there is a life. We talked about the journey of beginning to live without her brother's physical presence. We closed our visit with prayer. Pt presented strong faith and reflected her life and health journey, even though she was obviously tired, she seemed to find comfort as she reflected. Ernest Haber Chaplain  09/26/13 1500  Clinical Encounter Type  Visited With Patient

## 2013-09-26 NOTE — Progress Notes (Signed)
6 Days Post-Op  Subjective: No flatus, No stool, she is walking and would really like to move on.  Asking about path and plans after that.  Objective: Vital signs in last 24 hours: Temp:  [97.7 F (36.5 C)-98.3 F (36.8 C)] 97.7 F (36.5 C) (08/06 0616) Pulse Rate:  [106-116] 110 (08/06 0616) Resp:  [16-20] 16 (08/06 0616) BP: (107-127)/(72-82) 107/72 mmHg (08/06 0616) SpO2:  [97 %-100 %] 97 % (08/06 0616) Last BM Date: 09/19/13  900 PO recorded yesterday Diet:  Clears TNA Afebrile, tachycardic  HR in 100's Labs OK, anemia stable  Intake/Output from previous day: 08/05 0701 - 08/06 0700 In: 2330 [P.O.:900; I.V.:840; TPN:560] Out: 2550 [Urine:2550] Intake/Output this shift: Total I/O In: 240 [P.O.:240] Out: 320 [Urine:300; Drains:20]  General appearance: alert, cooperative and no distress Resp: clear to auscultation bilaterally GI: tender, mildly distended, it hurts for her to move around.  Sites and drains OK  Drainage from JP is serous.  No bowel sounds.  Lab Results:   Recent Labs  09/25/13 0410 09/26/13 0350  WBC 7.3 7.8  HGB 7.8* 7.9*  HCT 25.1* 25.1*  PLT 253 281    BMET  Recent Labs  09/24/13 0430 09/26/13 0350  NA 135* 135*  K 4.4 4.6  CL 99 97  CO2 31 32  GLUCOSE 144* 128*  BUN 13 14  CREATININE 0.64 0.73  CALCIUM 8.3* 8.8   PT/INR No results found for this basename: LABPROT, INR,  in the last 72 hours   Recent Labs Lab 09/20/13 0450 09/26/13 0350  AST 10 19  ALT 8 23  ALKPHOS 51 74  BILITOT 0.7 <0.2*  PROT 6.1 6.2  ALBUMIN 3.3* 2.7*     Lipase     Component Value Date/Time   LIPASE 26 09/14/2013 2103   Prior to Admission medications   Medication Sig Start Date End Date Taking? Authorizing Provider  aspirin 81 MG tablet Take 81 mg by mouth daily.   Yes Historical Provider, MD  bisacodyl (DULCOLAX) 5 MG EC tablet Take 5 mg by mouth daily as needed for moderate constipation.   Yes Historical Provider, MD  carvedilol (COREG)  6.25 MG tablet Take 6.25 mg by mouth 2 (two) times daily with a meal.   Yes Historical Provider, MD  FLUoxetine (PROZAC) 20 MG capsule Take 1 capsule (20 mg total) by mouth daily. 08/15/13  Yes Dene Gentry, MD  omeprazole (PRILOSEC) 20 MG capsule Take 1 capsule (20 mg total) by mouth 2 (two) times daily. 09/13/13  Yes Houston Siren III, MD  ondansetron (ZOFRAN ODT) 4 MG disintegrating tablet Take 1 tablet (4 mg total) by mouth every 8 (eight) hours as needed for nausea or vomiting. 09/11/13  Yes Cordelia Poche, MD  oxyCODONE-acetaminophen (PERCOCET) 5-325 MG per tablet Take 1 tablet by mouth every 6 (six) hours as needed for moderate pain. 09/12/13  Yes Pamella Pert, MD  promethazine (PHENERGAN) 25 MG suppository Place 1 suppository (25 mg total) rectally every 6 (six) hours as needed for nausea or vomiting. 09/13/13  Yes Houston Siren III, MD  azithromycin (ZITHROMAX) 250 MG tablet Take 1 tablet (250 mg total) by mouth daily. Take first 2 tablets together, then 1 every day until finished. 09/13/13   Houston Siren III, MD      No results found. . sodium chloride 35 mL/hr at 09/26/13 0609  . Marland KitchenTPN (CLINIMIX-E) Adult 80 mL/hr at 09/25/13 1738   And  . fat emulsion 250 mL (  09/25/13 1739)  . Marland KitchenTPN (CLINIMIX-E) Adult     And  . fat emulsion     Medications: . enoxaparin (LOVENOX) injection  40 mg Subcutaneous Q24H  . feeding supplement (RESOURCE BREEZE)  1 Container Oral Q24H  . insulin aspart  0-9 Units Subcutaneous TID WC  . metoprolol tartrate  25 mg Oral BID  . multivitamin with minerals  1 tablet Oral Daily  . pantoprazole (PROTONIX) IV  40 mg Intravenous Q12H  . traMADol  50 mg Oral 4 times per day  1. 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 - TUMOR INVADES LAMINA PROPRIA. - RESECTION MARGINS NEGATIVE FOR TUMOR. - METASTATIC CARCINOMA IN THREE OF FIVE LYMPH NODES (3/5). - CHRONIC ACTIVE GASTRITIS WITH  HELICOBACTER PYLORI. - FRAGMENT OF BENIGN PANCREATIC TISSUE. - SEE ONCOLOGY TABLE. 2. Lymph node, biopsy, peri-gastric - METASTATIC CARCINOMA IN ONE OF SIX LYMPH NODES (1/6). - LIPOGRANULOMATOUS LYMPHADENOPATHY. - SEE COMMENT. 3. Omentum, resection for tumor - FAT NECROSIS. - NO MALIGNANCY IDENTIFIED. - SEE COMMENT. pT1a, pN2, pMX  Assessment/Plan 1.  Gastric Carcinoma;  S/p Diagnostic laparoscopy, distal gastrectomy with billroth II anastamosis and feeding gastrojejunostomy tube, [name], MD, 09/20/13. 2.  Hypertension/tachycardia 3.  Hx of migraine/resolving subdural hematoma 4.  Back pain 5.  Incontinence 6.  PCM 7.  Anemia    Plan:  She is very anxious to know the next step.  She has no bowel sounds, but is doing ok with clears.  I told her I would let her have some apple sauce. It really hurts her to move, although she is moving. I am going to add some toradol and encouraged her to continue walking.  No constipation issues before surgery, so I think she just has not opened up yet. I sent path to Dr. [name] and ask him to try seeing her while she is here to help her understand the next step after surgery and recovery.    LOS: 12 days    Moiz Ryant 09/26/2013
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
## 2013-09-27 LAB — GLUCOSE, CAPILLARY
GLUCOSE-CAPILLARY: 165 mg/dL — AB (ref 70–99)
GLUCOSE-CAPILLARY: 109 mg/dL — AB (ref 70–99)
Glucose-Capillary: 124 mg/dL — ABNORMAL HIGH (ref 70–99)

## 2013-09-27 LAB — CREATININE, SERUM
Creatinine, Ser: 0.72 mg/dL (ref 0.50–1.10)
GFR calc Af Amer: >90 mL/min (ref >90)
GFR calc non Af Amer: >90 mL/min (ref >90)

## 2013-09-27 MED ORDER — FAT EMULSION 20 % IV EMUL
250.0000 mL | INTRAVENOUS | Status: AC
  Administered 2013-09-27: 250 mL via INTRAVENOUS
  Filled 2013-09-27: qty 250

## 2013-09-27 MED ORDER — CLINIMIX E/DEXTROSE (5/15) 5 % IV SOLN
INTRAVENOUS | Status: AC
  Administered 2013-09-27: 18:00:00 via INTRAVENOUS
  Filled 2013-09-27: qty 2000

## 2013-09-27 MED ORDER — METOPROLOL TARTRATE 50 MG PO TABS
50.0000 mg | ORAL_TABLET | Freq: Two times a day (BID) | ORAL | Status: DC
  Administered 2013-09-27 – 2013-10-09 (×21): 50 mg via ORAL
  Filled 2013-09-27 (×29): qty 1
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
## 2013-09-27 NOTE — Progress Notes (Signed)
Pt had a room filled with guest today. Will return later for a visit. Ernest Haber Chaplain  09/27/13 1300  Clinical Encounter Type  Visited With Patient

## 2013-09-27 NOTE — Progress Notes (Signed)
PARENTERAL NUTRITION CONSULT NOTE - Follow Up  Pharmacy Consult for TNA Indication:  Adenocarcinoma -gastric, unable to tolerate PO intake  No Known Allergies  Patient Measurements: Height: 5\' 5"  (165.1 cm) Weight: 156 lb 1.4 oz (70.8 kg) IBW/kg (Calculated) : 57 Adjusted Body Weight: 62 3-7 lb weight loss per RD notes   Vital Signs: Temp: 99.2 F (37.3 C) (08/07 0626) Temp src: Oral (08/07 0626) BP: 138/92 mmHg (08/07 0626) Pulse Rate: 125 (08/07 0626) Intake/Output from previous day: 08/06 0701 - 08/07 0700 In: 1355 [P.O.:480; I.V.:875] Out: 985 [Urine:950; Drains:35] Intake/Output from this shift: Total I/O In: -  Out: 5 [Drains:5]  Labs:  Recent Labs  09/25/13 0410 09/26/13 0350  WBC 7.3 7.8  HGB 7.8* 7.9*  HCT 25.1* 25.1*  PLT 253 281     Recent Labs  09/26/13 0350 09/27/13 0550  NA 135*  --   K 4.6  --   CL 97  --   CO2 32  --   GLUCOSE 128*  --   BUN 14  --   CREATININE 0.73 0.72  CALCIUM 8.8  --   MG 2.2  --   PHOS 4.2  --   PROT 6.2  --   ALBUMIN 2.7*  --   AST 19  --   ALT 23  --   ALKPHOS 74  --   BILITOT <0.2*  --    Estimated Creatinine Clearance: 86.7 ml/min (by C-G formula based on Cr of 0.72).    Recent Labs  09/26/13 1157 09/26/13 1657 09/27/13 0749  GLUCAP 126* 114* 165*    Medical History: Past Medical History  Diagnosis Date  . Headache(784.0) 2011    Migraines  . Esophageal reflux     On omeprazole  . Costochondritis   . Hypertension     started around 2011  . Ovarian cyst     Medications:  Scheduled:  . enoxaparin (LOVENOX) injection  40 mg Subcutaneous Q24H  . feeding supplement (RESOURCE BREEZE)  1 Container Oral Q24H  . insulin aspart  0-9 Units Subcutaneous TID WC  . metoprolol tartrate  25 mg Oral BID  . multivitamin with minerals  1 tablet Oral Daily  . pantoprazole (PROTONIX) IV  40 mg Intravenous Q12H  . polyethylene glycol  17 g Oral Daily  . traMADol  50 mg Oral 4 times per day    Infusions:  . sodium chloride 35 mL/hr at 09/26/13 0609  . Marland KitchenTPN (CLINIMIX-E) Adult 80 mL/hr at 09/26/13 1742   And  . fat emulsion 250 mL (09/26/13 1742)    Insulin Requirements in the past 24 hours:  3 unit, sens SSI  Current Nutrition:  CL - intake starting to improve  Nutritional Goals:   Per RD assessment, 1800-2000 kCal,  85-95 grams of protein per day  Clinimix 5/15 at goal rate of 80 ml/hr + lipid to provide 96g protein and 1843 kcal/day  IVF:  NS @ 35 ml/hr  Lines:  PICC placed 7/30  Assessment: 48 yo female with newly diagnosed poorly differentiated gastric adenocarcinoma, s/p distal gastrectomy/GJ 7/31.  8/7: D#9 TNA.  POD4 for distal gastrectomy, feeding GJT placement.  Ileus continues post-op. Per CCS, plan continue TNA, clear liquid diet and await bowel function, cont JP drain   Lytes: Na slightly low all other lytes WNL (8/6) no new labs today  Renal: stable, CrCl 87 ml/min (8/6)  Hepatic: WNL (8/6)  CBGs: stable < 150 except for one reading  TGs: 101 (7/31),  75 (8/4)  Prealb: 12.7 (7/31), 9.3 (8/3), 8.8 (8/4)  Plan:  At 1800 tonight: 1) Continue Clinimix E5/15 at goal rate of 80 ml/hr and continue lipids at rate of 10 ml/hr with trace elements daily 2) Receiving MVI and trace elements as PO daily 3) Mon/Thurs TNA labs 4) Continue IV fluids at 35 ml/hr 5) Await bowel function return   Kizzie Furnish, PharmD Pager: [phone] 09/27/2013 9:57 AM
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
## 2013-09-27 NOTE — Progress Notes (Signed)
Occupational Therapy Treatment Patient Details Name: Paula [name] MRN: [id number] DOB: 29-May-1965 Today's Date: 09/27/2013    History of present illness 48 yo female adm  09/14/13 with Gastric carcinoma, s/p Distal gastrectomy 09/20/13; PMHx: costochondritis;         -- MRI shows resolving SDH; CT neg for PE, small infiltrate positive for R sided pna   OT comments  Pt limited by pain and nausea this session. Nursing made aware. Pt only able to use walker to transfer into bathroom with much effort and nausea persisting. Pt states she didn't think she could stand and groom at the sink currently and declined toileting needs currently. Returned to recliner per pt request. Will continue to follow for acute OT.   Follow Up Recommendations  Home health OT;Supervision/Assistance - 24 hour    Equipment Recommendations  3 in 1 bedside comode    Recommendations for Other Services      Precautions / Restrictions Precautions Precautions: Fall Precaution Comments: JP drain/liquid diet Restrictions Weight Bearing Restrictions: No       Mobility Bed Mobility                  Transfers Overall transfer level: Needs assistance Equipment used: Rolling walker (2 wheeled) Transfers: Sit to/from Stand Sit to Stand: Min guard         General transfer comment: min guard assist for safety and verbal cues for hand placement. Pt with nausea and pain 8/10 this session    Balance                                   ADL                           Toilet Transfer: Minimal assistance;Ambulation;RW             General ADL Comments: Pt agreeable to OT but states she doesnt feel well today and reports nausea. She agreed to get up to the bathroom with walker and attempt grooming or toileting. She needed frequent verbal cues to stay close to her walker as she tended to push it foo far in front of her and had to stop several times and prop on walker due to  nausea. Once in bathroom, pt states she needed to come back to chair and wasnt able to attempt grooming or toileting practice. Nursing made aware of pain level at 8/10 also. Min assist for steadying with functional transfers.       Vision                     Perception     Praxis      Cognition   Behavior During Therapy: WFL for tasks assessed/performed Overall Cognitive Status: Within Functional Limits for tasks assessed                       Extremity/Trunk Assessment               Exercises     Shoulder Instructions       General Comments      Pertinent Vitals/ Pain       Pain Assessment: 0-10 Pain Score: 8  Pain Intervention(s): Patient requesting pain meds-RN notified;Repositioned  Home Living  Prior Functioning/Environment              Frequency Min 2X/week     Progress Toward Goals  OT Goals(current goals can now be found in the care plan section)  Progress towards OT goals: Not progressing toward goals - comment (limited by pain and nausa this session)     Plan Discharge plan remains appropriate    Co-evaluation                 End of Session Equipment Utilized During Treatment: Rolling walker   Activity Tolerance Patient limited by pain;Other (comment) (nausea)   Patient Left in chair;with call bell/phone within reach;with nursing/sitter in room   Nurse Communication          Time: 1140-1156 OT Time Calculation (min): 16 min  Charges: OT General Charges $OT Visit: 1 Procedure OT Treatments $Therapeutic Activity: 8-22 mins  Jules Schick [phone] 09/27/2013, 12:12 PM

## 2013-09-28 LAB — GLUCOSE, CAPILLARY
Glucose-Capillary: 122 mg/dL — ABNORMAL HIGH (ref 70–99)
Glucose-Capillary: 128 mg/dL — ABNORMAL HIGH (ref 70–99)
Glucose-Capillary: 89 mg/dL (ref 70–99)

## 2013-09-28 MED ORDER — FAT EMULSION 20 % IV EMUL
250.0000 mL | INTRAVENOUS | Status: AC
  Administered 2013-09-28: 250 mL via INTRAVENOUS
  Filled 2013-09-28: qty 250

## 2013-09-28 MED ORDER — MAGNESIUM HYDROXIDE 400 MG/5ML PO SUSP
30.0000 mL | Freq: Once | ORAL | Status: AC
  Administered 2013-09-28: 30 mL via ORAL
  Filled 2013-09-28: qty 30

## 2013-09-28 MED ORDER — HYDROMORPHONE HCL PF 1 MG/ML IJ SOLN
1.0000 mg | INTRAMUSCULAR | Status: DC | PRN
  Administered 2013-09-28 – 2013-09-30 (×9): 1 mg via INTRAVENOUS
  Filled 2013-09-28 (×9): qty 1

## 2013-09-28 MED ORDER — CLINIMIX E/DEXTROSE (5/15) 5 % IV SOLN
INTRAVENOUS | Status: AC
  Administered 2013-09-28: 18:00:00 via INTRAVENOUS
  Filled 2013-09-28: qty 2000

## 2013-09-28 NOTE — Progress Notes (Signed)
OT Cancellation Note  Patient Details Name: MELAINIE KRINSKY MRN: [id number] DOB: 1965-04-13   Cancelled Treatment:    Reason Eval/Treat Not Completed: Other (comment)  Pt up in bathroom, ambulating at supervision level and completing grooming at sink at this level.  ADL was already done.  Will check another day.  Briona Korpela 09/28/2013, 2:44 PM Lesle Chris, OTR/L [phone] 09/28/2013

## 2013-09-28 NOTE — Progress Notes (Signed)
8 Days Post-Op  Subjective: Tolerating clear liquids so advanced to full liquid diet.  Passing gas.  No BM.  Objective: Vital signs in last 24 hours: Temp:  [97.7 F (36.5 C)-98.3 F (36.8 C)] 98.2 F (36.8 C) (08/08 0539) Pulse Rate:  [100-116] 100 (08/08 0539) Resp:  [16-18] 16 (08/07 2117) BP: (120-139)/(78-83) 120/78 mmHg (08/08 0539) SpO2:  [100 %] 100 % (08/08 0539) Weight:  [158 lb 14.4 oz (72.077 kg)] 158 lb 14.4 oz (72.077 kg) (08/07 1442) Last BM Date: 09/19/13  Intake/Output from previous day: 08/07 0701 - 08/08 0700 In: 2190 [P.O.:360; I.V.:840; TPN:990] Out: 24 [Urine:2; Drains:22] Intake/Output this shift:    PE: General- In NAD Abdomen-soft, incision clean and intact, serous drain output, jejunostomy tube on left, few bowel sounds  Lab Results:   Recent Labs  09/26/13 0350  WBC 7.8  HGB 7.9*  HCT 25.1*  PLT 281   BMET  Recent Labs  09/26/13 0350 09/27/13 0550  NA 135*  --   K 4.6  --   CL 97  --   CO2 32  --   GLUCOSE 128*  --   BUN 14  --   CREATININE 0.73 0.72  CALCIUM 8.8  --    PT/INR No results found for this basename: LABPROT, INR,  in the last 72 hours Comprehensive Metabolic Panel:    Component Value Date/Time   NA 135* 09/26/2013 0350   NA 135* 09/24/2013 0430   K 4.6 09/26/2013 0350   K 4.4 09/24/2013 0430   CL 97 09/26/2013 0350   CL 99 09/24/2013 0430   CO2 32 09/26/2013 0350   CO2 31 09/24/2013 0430   BUN 14 09/26/2013 0350   BUN 13 09/24/2013 0430   CREATININE 0.72 09/27/2013 0550   CREATININE 0.73 09/26/2013 0350   GLUCOSE 128* 09/26/2013 0350   GLUCOSE 144* 09/24/2013 0430   CALCIUM 8.8 09/26/2013 0350   CALCIUM 8.3* 09/24/2013 0430   AST 19 09/26/2013 0350   AST 10 09/20/2013 0450   ALT 23 09/26/2013 0350   ALT 8 09/20/2013 0450   ALKPHOS 74 09/26/2013 0350   ALKPHOS 51 09/20/2013 0450   BILITOT <0.2* 09/26/2013 0350   BILITOT 0.7 09/20/2013 0450   PROT 6.2 09/26/2013 0350   PROT 6.1 09/20/2013 0450   ALBUMIN 2.7* 09/26/2013 0350   ALBUMIN 3.3*  09/20/2013 0450     Studies/Results: Dg Abd 1 View  09/26/2013   CLINICAL DATA:  Postop ileus.  EXAM: ABDOMEN - 1 VIEW  COMPARISON:  No prior.  FINDINGS: Soft tissue structures are unremarkable. Surgical tubing noted over the right abdomen. Surgical staples and clips noted in the abdomen. Gas pattern is nonspecific. No free air. No acute bony abnormality. Lung bases are clear.  IMPRESSION: Postsurgical changes.  No evidence of bowel distention.   Electronically Signed   By: Marcello Moores  Register   On: 09/26/2013 20:28    Anti-infectives: Anti-infectives   Start     Dose/Rate Route Frequency Ordered Stop   09/20/13 1915  cefOXitin (MEFOXIN) 1 g in dextrose 5 % 50 mL IVPB     1 g 100 mL/hr over 30 Minutes Intravenous Every 6 hours 09/20/13 1903 09/21/13 0703   09/20/13 0600  [MAR Hold]  cefOXitin (MEFOXIN) 2 g in dextrose 5 % 50 mL IVPB     (On MAR Hold since 09/20/13 1215)  Comments:  Pharmacy may adjust dosing strength, interval, or rate of medication as needed for optimal therapy for the  patient Send with patient on call to the OR.  Anesthesia to complete antibiotic administration <81min prior to incision per [facility].   2 g 100 mL/hr over 30 Minutes Intravenous On call to O.R. 09/19/13 1119 09/20/13 1315      Assessment 1. Gastric Carcinoma; S/p Diagnostic laparoscopy, distal gastrectomy with billroth II anastamosis and feeding gastrojejunostomy tube, [name], MD, 09/20/13-some delayed gastric emptying postop   LOS: 14 days   Plan: Try full liquids today.  Remove drain when she is on a solid diet.    Zabrina Brotherton J 09/28/2013

## 2013-09-28 NOTE — Progress Notes (Addendum)
PARENTERAL NUTRITION CONSULT NOTE - Follow Up  Pharmacy Consult for TNA Indication:  Adenocarcinoma -gastric, unable to tolerate PO intake  No Known Allergies  Patient Measurements: Height: 5\' 5"  (165.1 cm) Weight: 158 lb 14.4 oz (72.077 kg) IBW/kg (Calculated) : 57 Adjusted Body Weight: 62 3-7 lb weight loss per RD notes   Vital Signs: Temp: 98.2 F (36.8 C) (08/08 0539) Temp src: Oral (08/08 0539) BP: 120/78 mmHg (08/08 0539) Pulse Rate: 100 (08/08 0539) Intake/Output from previous day: 08/07 0701 - 08/08 0700 In: 2190 [P.O.:360; I.V.:840; TPN:990] Out: 24 [Urine:2; Drains:22] Intake/Output from this shift:    Labs:  Recent Labs  09/26/13 0350  WBC 7.8  HGB 7.9*  HCT 25.1*  PLT 281     Recent Labs  09/26/13 0350 09/27/13 0550  NA 135*  --   K 4.6  --   CL 97  --   CO2 32  --   GLUCOSE 128*  --   BUN 14  --   CREATININE 0.73 0.72  CALCIUM 8.8  --   MG 2.2  --   PHOS 4.2  --   PROT 6.2  --   ALBUMIN 2.7*  --   AST 19  --   ALT 23  --   ALKPHOS 74  --   BILITOT <0.2*  --    Estimated Creatinine Clearance: 87.4 ml/min (by C-G formula based on Cr of 0.72).    Recent Labs  09/27/13 1158 09/27/13 1635 09/28/13 0811  GLUCAP 124* 109* 122*    Medical History: Past Medical History  Diagnosis Date  . Headache(784.0) 2011    Migraines  . Esophageal reflux     On omeprazole  . Costochondritis   . Hypertension     started around 2011  . Ovarian cyst     Medications:  Scheduled:  . enoxaparin (LOVENOX) injection  40 mg Subcutaneous Q24H  . feeding supplement (RESOURCE BREEZE)  1 Container Oral Q24H  . insulin aspart  0-9 Units Subcutaneous TID WC  . metoprolol tartrate  50 mg Oral BID  . multivitamin with minerals  1 tablet Oral Daily  . pantoprazole (PROTONIX) IV  40 mg Intravenous Q12H  . polyethylene glycol  17 g Oral Daily  . traMADol  50 mg Oral 4 times per day   Infusions:  . sodium chloride 35 mL/hr at 09/27/13 1335  . Marland KitchenTPN  (CLINIMIX-E) Adult 80 mL/hr at 09/27/13 1756   And  . fat emulsion 250 mL (09/27/13 1758)    Insulin Requirements in the past 24 hours:  3 unit, sens SSI  Current Nutrition:  FL diet - advanced 8/8 - follow up toleration  Nutritional Goals:   Per RD assessment, 1800-2000 kCal,  85-95 grams of protein per day  Clinimix 5/15 at goal rate of 80 ml/hr + lipid to provide 96g protein and 1843 kcal/day  IVF:  NS @ 35 ml/hr  Lines:  PICC placed 7/30  Assessment: 48 yo female with newly diagnosed poorly differentiated gastric adenocarcinoma, s/p distal gastrectomy/GJ 7/31.  8/8: D#10 TNA.  POD8 for distal gastrectomy, feeding GJT placement.  Patient passing flatus now and tolerating CL diet so [facility] has advanced diet to [facility] today.    Lytes: Na slightly low all other lytes WNL (8/6) no new labs today  Renal: stable, CrCl 87 ml/min (8/6)  Hepatic: WNL (8/6)  CBGs: stable < 150 except for one reading  TGs: 101 (7/31), 75 (8/4)  Prealb: 12.7 (7/31), 9.3 (8/3), 8.8 (  8/4)  Plan:  At 1800 tonight: 1) Continue Clinimix E5/15 at goal rate of 80 ml/hr and continue lipids at rate of 10 ml/hr with trace elements daily 2) Receiving MVI and trace elements as PO daily 3) Mon/Thurs TNA labs 4) Continue IV fluids at 35 ml/hr 5) Await bowel function return 6) If tolerates FL diet starting today, hope to wean TNA soon in next 1-2 days   Adrian Saran, PharmD, BCPS Pager [phone] 09/28/2013 8:55 AM
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
## 2013-09-29 DIAGNOSIS — R112 Nausea with vomiting, unspecified: Secondary | ICD-10-CM

## 2013-09-29 LAB — BASIC METABOLIC PANEL
ANION GAP: 11 (ref 5–15)
BUN: 16 mg/dL (ref 6–23)
CO2: 26 mEq/L (ref 19–32)
Calcium: 8.8 mg/dL (ref 8.4–10.5)
Chloride: 93 mEq/L — ABNORMAL LOW (ref 96–112)
Creatinine, Ser: 0.65 mg/dL (ref 0.50–1.10)
GFR calc non Af Amer: >90 mL/min (ref >90)
Glucose, Bld: 192 mg/dL — ABNORMAL HIGH (ref 70–99)
POTASSIUM: 4.6 meq/L (ref 3.7–5.3)
SODIUM: 130 meq/L — AB (ref 137–147)

## 2013-09-29 LAB — CBC
HCT: 27.3 % — ABNORMAL LOW (ref 36.0–46.0)
Hemoglobin: 8.9 g/dL — ABNORMAL LOW (ref 12.0–15.0)
MCH: 26.6 pg (ref 26.0–34.0)
MCHC: 32.6 g/dL (ref 30.0–36.0)
MCV: 81.5 fL (ref 78.0–100.0)
PLATELETS: 461 10*3/uL — AB (ref 150–400)
RBC: 3.35 MIL/uL — AB (ref 3.87–5.11)
RDW: 14.7 % (ref 11.5–15.5)
WBC: 12.6 10*3/uL — AB (ref 4.0–10.5)

## 2013-09-29 LAB — GLUCOSE, CAPILLARY
GLUCOSE-CAPILLARY: 165 mg/dL — AB (ref 70–99)
Glucose-Capillary: 154 mg/dL — ABNORMAL HIGH (ref 70–99)
Glucose-Capillary: 170 mg/dL — ABNORMAL HIGH (ref 70–99)
Glucose-Capillary: 156 mg/dL — ABNORMAL HIGH (ref 70–99)

## 2013-09-29 MED ORDER — BISACODYL 10 MG RE SUPP
10.0000 mg | Freq: Once | RECTAL | Status: AC
  Administered 2013-09-29: 10 mg via RECTAL
  Filled 2013-09-29: qty 1

## 2013-09-29 MED ORDER — CLINIMIX E/DEXTROSE (5/15) 5 % IV SOLN
INTRAVENOUS | Status: AC
  Administered 2013-09-29: 18:00:00 via INTRAVENOUS
  Filled 2013-09-29: qty 2000

## 2013-09-29 MED ORDER — FAT EMULSION 20 % IV EMUL
250.0000 mL | INTRAVENOUS | Status: AC
  Administered 2013-09-29: 250 mL via INTRAVENOUS
  Filled 2013-09-29: qty 250

## 2013-09-29 NOTE — Progress Notes (Signed)
Occupational Therapy Treatment Patient Details Name: Paula [name] MRN: [id number] DOB: 1966-02-11 Today's Date: 09/29/2013    History of present illness 48 yo female adm  09/14/13 with Gastric carcinoma, s/p Distal gastrectomy 09/20/13; PMHx: costochondritis;         -- MRI shows resolving SDH; CT neg for PE, small infiltrate positive for R sided pna   OT comments  Pt not feeling well this day  Follow Up Recommendations  Home health OT;Supervision/Assistance - 24 hour    Equipment Recommendations  3 in 1 bedside comode       Precautions / Restrictions Precautions Precautions: Fall Precaution Comments: JP drain/liquid diet Restrictions Weight Bearing Restrictions: No       Mobility Bed Mobility Overal bed mobility: Needs Assistance       Supine to sit: HOB elevated;Min guard Sit to supine: Min guard   General bed mobility comments: increasesd time due to ABD discomfort     Transfers Overall transfer level: Needs assistance Equipment used: 1 person hand held assist Transfers: Sit to/from Stand Sit to Stand: Min guard         General transfer comment: minguard for safety due to multiple lines/tubes plus increased time.    Balance                                   ADL                       Lower Body Dressing: Moderate assistance;Sit to/from stand   Toilet Transfer: Minimal Patent examiner Details (indicate cue type and reason): pt with pain and nausea this day Toileting- Water quality scientist and Hygiene: Min guard;Sit to/from stand         General ADL Comments: Pt not feeling well this day but did want to get to [facility] and change her depends. OT called for meds for pt and pt ask OT to stay with her until RN came.                  Cognition   Behavior During Therapy: WFL for tasks assessed/performed Overall Cognitive Status: Within Functional Limits for tasks assessed                                General Comments  nausea and pain limiting this patient    Pertinent Vitals/ Pain       Pain Score: 7  Pain Descriptors / Indicators: Constant Pain Intervention(s): Repositioned;Patient requesting pain meds-RN notified         Frequency       Progress Toward Goals  OT Goals(current goals can now be found in the care plan section)  Progress towards OT goals: Not progressing toward goals - comment     Plan Discharge plan remains appropriate    Co-evaluation                 End of Session     Activity Tolerance Patient limited by pain   Patient Left in bed;with call bell/phone within reach;with nursing/sitter in room   Nurse Communication          Time: 0950-1020 OT Time Calculation (min): 30 min  Charges: OT General Charges $OT Visit: 1 Procedure OT Treatments $Self Care/Home Management : 23-37 mins  Timara Loma D 09/29/2013, 11:24 AM

## 2013-09-29 NOTE — Progress Notes (Signed)
Patient refuses to take anything by mouth only IV medication. None of her PO med was taken but continue to ask for IV dilaudid.

## 2013-09-29 NOTE — Progress Notes (Signed)
Pt with vomiting episode- brown with blood tinged. Pt very concerned. Brien Mates notified. New order receive to place pt NPO for now.  Will continue to monitor. Wendee Beavers [place]
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
## 2013-09-29 NOTE — Progress Notes (Addendum)
9 Days Post-Op  Subjective: Vomited after taking Milk of Magnesia.  Passing gas but no BM.  Voiding a lot.  Objective: Vital signs in last 24 hours: Temp:  [97.7 F (36.5 C)-98.3 F (36.8 C)] 98.3 F (36.8 C) (08/09 0645) Pulse Rate:  [84-105] 105 (08/09 0645) Resp:  [20] 20 (08/09 0645) BP: (114-153)/(68-87) 153/83 mmHg (08/09 0645) SpO2:  [100 %] 100 % (08/09 0645) Last BM Date: 09/19/13  Intake/Output from previous day: 08/08 0701 - 08/09 0700 In: 865 [P.O.:480; I.V.:385] Out: 23 [Drains:23] Intake/Output this shift:    PE: General- In NAD Abdomen-soft, incision clean and intact, serous drain output, jejunostomy tube on left, few bowel sounds  Lab Results:   Recent Labs  09/29/13 0500  WBC 12.6*  HGB 8.9*  HCT 27.3*  PLT 461*   BMET  Recent Labs  09/27/13 0550 09/29/13 0500  NA  --  130*  K  --  4.6  CL  --  93*  CO2  --  26  GLUCOSE  --  192*  BUN  --  16  CREATININE 0.72 0.65  CALCIUM  --  8.8   PT/INR No results found for this basename: LABPROT, INR,  in the last 72 hours Comprehensive Metabolic Panel:    Component Value Date/Time   NA 130* 09/29/2013 0500   NA 135* 09/26/2013 0350   K 4.6 09/29/2013 0500   K 4.6 09/26/2013 0350   CL 93* 09/29/2013 0500   CL 97 09/26/2013 0350   CO2 26 09/29/2013 0500   CO2 32 09/26/2013 0350   BUN 16 09/29/2013 0500   BUN 14 09/26/2013 0350   CREATININE 0.65 09/29/2013 0500   CREATININE 0.72 09/27/2013 0550   GLUCOSE 192* 09/29/2013 0500   GLUCOSE 128* 09/26/2013 0350   CALCIUM 8.8 09/29/2013 0500   CALCIUM 8.8 09/26/2013 0350   AST 19 09/26/2013 0350   AST 10 09/20/2013 0450   ALT 23 09/26/2013 0350   ALT 8 09/20/2013 0450   ALKPHOS 74 09/26/2013 0350   ALKPHOS 51 09/20/2013 0450   BILITOT <0.2* 09/26/2013 0350   BILITOT 0.7 09/20/2013 0450   PROT 6.2 09/26/2013 0350   PROT 6.1 09/20/2013 0450   ALBUMIN 2.7* 09/26/2013 0350   ALBUMIN 3.3* 09/20/2013 0450     Studies/Results: No results found.  Anti-infectives: Anti-infectives   Start     Dose/Rate Route Frequency Ordered Stop   09/20/13 1915  cefOXitin (MEFOXIN) 1 g in dextrose 5 % 50 mL IVPB     1 g 100 mL/hr over 30 Minutes Intravenous Every 6 hours 09/20/13 1903 09/21/13 0703   09/20/13 0600  [MAR Hold]  cefOXitin (MEFOXIN) 2 g in dextrose 5 % 50 mL IVPB     (On MAR Hold since 09/20/13 1215)  Comments:  Pharmacy may adjust dosing strength, interval, or rate of medication as needed for optimal therapy for the patient Send with patient on call to the OR.  Anesthesia to complete antibiotic administration <39min prior to incision per [facility].   2 g 100 mL/hr over 30 Minutes Intravenous On call to O.R. 09/19/13 1119 09/20/13 1315      Assessment 1. Gastric Carcinoma; S/p Diagnostic laparoscopy, distal gastrectomy with billroth II anastamosis and feeding gastrojejunostomy tube, [name], MD, 09/20/13- delayed gastric emptying postop; vomited after milk of magnesia   LOS: 15 days   Plan: Continue full liquids today.  If she continues to have oral intake problems, may need to start jejunostomy tube feedings.  Dulcolax suppository.    Sandee Bernath J 09/29/2013

## 2013-09-29 NOTE — Progress Notes (Addendum)
PARENTERAL NUTRITION CONSULT NOTE - Follow Up  Pharmacy Consult for TNA Indication:  Adenocarcinoma -gastric, unable to tolerate PO intake  No Known Allergies  Patient Measurements: Height: 5\' 5"  (165.1 cm) Weight: 158 lb 14.4 oz (72.077 kg) IBW/kg (Calculated) : 57 Adjusted Body Weight: 62 3-7 lb weight loss per RD notes   Vital Signs: Temp: 98.3 F (36.8 C) (08/09 0645) Temp src: Oral (08/09 0645) BP: 153/83 mmHg (08/09 0645) Pulse Rate: 105 (08/09 0645) Intake/Output from previous day: 08/08 0701 - 08/09 0700 In: 2400 [P.O.:480; I.V.:840; TPN:1080] Out: 323 [Urine:300; Drains:23] Intake/Output from this shift:    Labs:  Recent Labs  09/29/13 0500  WBC 12.6*  HGB 8.9*  HCT 27.3*  PLT 461*     Recent Labs  09/27/13 0550 09/29/13 0500  NA  --  130*  K  --  4.6  CL  --  93*  CO2  --  26  GLUCOSE  --  192*  BUN  --  16  CREATININE 0.72 0.65  CALCIUM  --  8.8   Estimated Creatinine Clearance: 87.4 ml/min (by C-G formula based on Cr of 0.65).    Recent Labs  09/28/13 0811 09/28/13 1238 09/28/13 1822  GLUCAP 122* 128* 89    Medical History: Past Medical History  Diagnosis Date  . Headache(784.0) 2011    Migraines  . Esophageal reflux     On omeprazole  . Costochondritis   . Hypertension     started around 2011  . Ovarian cyst     Medications:  Scheduled:  . bisacodyl  10 mg Rectal Once  . enoxaparin (LOVENOX) injection  40 mg Subcutaneous Q24H  . feeding supplement (RESOURCE BREEZE)  1 Container Oral Q24H  . insulin aspart  0-9 Units Subcutaneous TID WC  . metoprolol tartrate  50 mg Oral BID  . multivitamin with minerals  1 tablet Oral Daily  . pantoprazole (PROTONIX) IV  40 mg Intravenous Q12H  . polyethylene glycol  17 g Oral Daily  . traMADol  50 mg Oral 4 times per day   Infusions:  . sodium chloride 35 mL/hr at 09/28/13 1829  . Marland KitchenTPN (CLINIMIX-E) Adult 80 mL/hr at 09/28/13 1800   And  . fat emulsion 250 mL (09/28/13 1800)     Insulin Requirements in the past 24 hours:  2 unit, sens SSI  Current Nutrition:  FL diet - advanced 8/8 - follow up toleration  Nutritional Goals:   Per RD assessment, 1800-2000 kCal,  85-95 grams of protein per day  Clinimix 5/15 at goal rate of 80 ml/hr + lipid to provide 96g protein and 1843 kcal/day  IVF:  NS @ 35 ml/hr  Lines:  PICC placed 7/30  Assessment: 48 yo female with newly diagnosed poorly differentiated gastric adenocarcinoma, s/p distal gastrectomy/GJ 7/31.  8/9: D#11 TNA.  POD9 for distal gastrectomy, feeding GJT placement. Patient having difficulty with oral intake so far, may need to start TFs if continues per [facility] notes. Plan continue full liquid diet today,    Lytes: Na slightly low all other lytes WNL - Md to address hyponatremia as cannot adjust electrolytes in TNA bag  Renal: stable, CrCl 87 ml/min  Hepatic: WNL (8/6)  CBGs: stable < 150 except for one reading  TGs: 101 (7/31), 75 (8/4)  Prealb: 12.7 (7/31), 9.3 (8/3), 8.8 (8/4)  Plan:  At 1800 tonight: 1) Continue Clinimix E5/15 at goal rate of 80 ml/hr and continue lipids at rate of 10 ml/hr with trace  elements daily 2) Receiving MVI and trace elements as PO daily 3) Mon/Thurs TNA labs 4) Continue IV fluids at 35 ml/hr 5) Await bowel function return 6) If tolerates FL diet, hope to wean TNA soon in next 1-2 days   Adrian Saran, PharmD, BCPS Pager [phone] 09/29/2013 8:20 AM

## 2013-09-30 ENCOUNTER — Inpatient Hospital Stay (HOSPITAL_COMMUNITY): Payer: No Typology Code available for payment source

## 2013-09-30 LAB — CBC
HCT: 27.3 % — ABNORMAL LOW (ref 36.0–46.0)
Hemoglobin: 8.9 g/dL — ABNORMAL LOW (ref 12.0–15.0)
MCH: 26.3 pg (ref 26.0–34.0)
MCHC: 32.6 g/dL (ref 30.0–36.0)
MCV: 80.8 fL (ref 78.0–100.0)
Platelets: 455 10*3/uL — ABNORMAL HIGH (ref 150–400)
RBC: 3.38 MIL/uL — ABNORMAL LOW (ref 3.87–5.11)
RDW: 14.8 % (ref 11.5–15.5)
WBC: 12.4 10*3/uL — ABNORMAL HIGH (ref 4.0–10.5)

## 2013-09-30 LAB — DIFFERENTIAL
Basophils Absolute: 0 10*3/uL (ref 0.0–0.1)
Basophils Relative: 0 % (ref 0–1)
Eosinophils Absolute: 0.1 10*3/uL (ref 0.0–0.7)
Eosinophils Relative: 1 % (ref 0–5)
Lymphocytes Relative: 12 % (ref 12–46)
Lymphs Abs: 1.4 10*3/uL (ref 0.7–4.0)
Monocytes Absolute: 1.3 10*3/uL — ABNORMAL HIGH (ref 0.1–1.0)
Monocytes Relative: 10 % (ref 3–12)
Neutro Abs: 9.6 10*3/uL — ABNORMAL HIGH (ref 1.7–7.7)
Neutrophils Relative %: 77 % (ref 43–77)

## 2013-09-30 LAB — GLUCOSE, CAPILLARY
GLUCOSE-CAPILLARY: 127 mg/dL — AB (ref 70–99)
Glucose-Capillary: 141 mg/dL — ABNORMAL HIGH (ref 70–99)
Glucose-Capillary: 141 mg/dL — ABNORMAL HIGH (ref 70–99)

## 2013-09-30 LAB — COMPREHENSIVE METABOLIC PANEL
ALBUMIN: 2.8 g/dL — AB (ref 3.5–5.2)
ALT: 21 U/L (ref 0–35)
ANION GAP: 11 (ref 5–15)
AST: 17 U/L (ref 0–37)
Alkaline Phosphatase: 99 U/L (ref 39–117)
BUN: 14 mg/dL (ref 6–23)
CHLORIDE: 97 meq/L (ref 96–112)
CO2: 28 mEq/L (ref 19–32)
CREATININE: 0.77 mg/dL (ref 0.50–1.10)
Calcium: 9.2 mg/dL (ref 8.4–10.5)
GFR calc Af Amer: >90 mL/min (ref >90)
GFR calc non Af Amer: >90 mL/min (ref >90)
Glucose, Bld: 158 mg/dL — ABNORMAL HIGH (ref 70–99)
Potassium: 4.6 mEq/L (ref 3.7–5.3)
Sodium: 136 mEq/L — ABNORMAL LOW (ref 137–147)
TOTAL PROTEIN: 6.7 g/dL (ref 6.0–8.3)

## 2013-09-30 LAB — MAGNESIUM: MAGNESIUM: 1.8 mg/dL (ref 1.5–2.5)

## 2013-09-30 LAB — PHOSPHORUS: Phosphorus: 3.7 mg/dL (ref 2.3–4.6)

## 2013-09-30 LAB — TRIGLYCERIDES: TRIGLYCERIDES: 59 mg/dL (ref <150)

## 2013-09-30 LAB — PREALBUMIN: Prealbumin: 14.5 mg/dL — ABNORMAL LOW (ref 17.0–34.0)

## 2013-09-30 MED ORDER — FAT EMULSION 20 % IV EMUL
250.0000 mL | INTRAVENOUS | Status: AC
  Administered 2013-09-30: 250 mL via INTRAVENOUS
  Filled 2013-09-30: qty 250

## 2013-09-30 MED ORDER — HYDROMORPHONE HCL PF 1 MG/ML IJ SOLN
1.0000 mg | INTRAMUSCULAR | Status: DC | PRN
  Administered 2013-09-30: 1 mg via INTRAVENOUS
  Filled 2013-09-30: qty 1

## 2013-09-30 MED ORDER — ALTEPLASE 2 MG IJ SOLR
2.0000 mg | Freq: Once | INTRAMUSCULAR | Status: AC
  Administered 2013-09-30: 2 mg
  Filled 2013-09-30: qty 2

## 2013-09-30 MED ORDER — TRACE MINERALS CR-CU-F-FE-I-MN-MO-SE-ZN IV SOLN
INTRAVENOUS | Status: AC
  Administered 2013-09-30: 18:00:00 via INTRAVENOUS
  Filled 2013-09-30: qty 2000

## 2013-09-30 MED ORDER — HYDROMORPHONE HCL PF 1 MG/ML IJ SOLN
1.0000 mg | INTRAMUSCULAR | Status: DC | PRN
  Administered 2013-09-30 – 2013-10-01 (×7): 2 mg via INTRAVENOUS
  Filled 2013-09-30 (×7): qty 2
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
## 2013-09-30 NOTE — Progress Notes (Signed)
PARENTERAL NUTRITION CONSULT NOTE - Follow Up  Pharmacy Consult for TNA Indication: Gastric adenocarcinoma, unable to tolerate PO intake  No Known Allergies  Patient Measurements: Height: 5\' 5"  (165.1 cm) Weight: 158 lb 14.4 oz (72.077 kg) IBW/kg (Calculated) : 57 Adjusted Body Weight: 62 3-7 lb weight loss per RD notes   Vital Signs: Temp: 98.6 F (37 C) (08/09 2216) Temp src: Oral (08/09 2216) BP: 157/87 mmHg (08/10 0120) Pulse Rate: 105 (08/10 0120) Intake/Output from previous day: 08/09 0701 - 08/10 0700 In: 470 [P.O.:20; I.V.:420; TPN:20] Out: 405 [Urine:400; Drains:5] Intake/Output from this shift:    Labs:  Recent Labs  09/29/13 0500 09/30/13 0515  WBC 12.6* 12.4*  HGB 8.9* 8.9*  HCT 27.3* 27.3*  PLT 461* 455*     Recent Labs  09/29/13 0500 09/30/13 0515  NA 130* 136*  K 4.6 4.6  CL 93* 97  CO2 26 28  GLUCOSE 192* 158*  BUN 16 14  CREATININE 0.65 0.77  CALCIUM 8.8 9.2  MG  --  1.8  PHOS  --  3.7  PROT  --  6.7  ALBUMIN  --  2.8*  AST  --  17  ALT  --  21  ALKPHOS  --  99  BILITOT  --  <0.2*   Estimated Creatinine Clearance: 87.4 ml/min (by C-G formula based on Cr of 0.77).    Recent Labs  09/29/13 1200 09/29/13 1659 09/29/13 2250  GLUCAP 154* 170* 156*    Medications:  Scheduled:  . enoxaparin (LOVENOX) injection  40 mg Subcutaneous Q24H  . feeding supplement (RESOURCE BREEZE)  1 Container Oral Q24H  . insulin aspart  0-9 Units Subcutaneous TID WC  . metoprolol tartrate  50 mg Oral BID  . multivitamin with minerals  1 tablet Oral Daily  . pantoprazole (PROTONIX) IV  40 mg Intravenous Q12H  . polyethylene glycol  17 g Oral Daily  . traMADol  50 mg Oral 4 times per day   Infusions:  . sodium chloride 35 mL/hr at 09/29/13 2313  . Marland KitchenTPN (CLINIMIX-E) Adult 80 mL/hr at 09/29/13 1734   And  . fat emulsion 250 mL (09/29/13 1734)    Insulin Requirements in the past 24 hours:  6 units, sens SSI  Current Nutrition:  Changed back  to NPO 8/9 due to vomiting  Nutritional Goals:   Per RD assessment, 1800-2000 kCal,  85-95 grams of protein per day  Clinimix 5/15 at goal rate of 80 ml/hr + lipid to provide 96g protein and 1843 kcal/day  IVF:  NS @ 35 ml/hr  Lines:  PICC placed 7/30  Assessment: 48 yo female with newly diagnosed poorly differentiated gastric adenocarcinoma, s/p distal gastrectomy/GJ 7/31.  8/10: D#12 TNA.  POD10 for distal gastrectomy, feeding GJT placement. Patient having difficulty with oral intake so far, may need to start J-tube feedings if continues per [facility] notes. Diet changed back to NPO for continued vomiting and pt refusal to take anything PO.   Lytes: Na low but improved. All other lytes WNL.  Renal: stable, CrCl 87 ml/min  Hepatic: WNL   CBGs: now slightly above goal of less than 150   TGs: 101 (7/31), 75 (8/4), pending today  Prealb: 12.7 (7/31), 9.3 (8/3), 8.8 (8/4), pending today  Plan:   Continue Clinimix E5/15 at goal rate of 80 ml/hr   continue lipids at rate of 10 ml/hr   Since now NPO, will add MVI and trace elements back to TNA daily  Mon/Thurs TNA  labs  Continue IV fluids at 35 ml/hr  F/u for ability to restart diet   Peggyann Juba, PharmD, BCPS Pager: [phone]  09/30/2013 7:27 AM

## 2013-09-30 NOTE — Progress Notes (Signed)
Patient much better today, up ambulated in the hall about 3times, pain medication helping alitte more today thank yesterday per patient, vomited once this AM, she was able to take some of her PO meds and liq diet. Will  Continue to F/U with plan of care.

## 2013-09-30 NOTE — Progress Notes (Signed)
General Surgery Note  LOS: 16 days  POD -  10 Days Post-Op  Assessment/Plan: 1.  DIAGNOSTIC LAPAROSCOPY, DISTAL GASTRECTOMY AND FEEDING GASTROJEJUNOSTOMY - 09/20/2013 - [facility]. [facility]  Path - Signet ring adenoca, 4/11 nodes positive  Nauseated/vomited over the weekend.  Will try to restart the clear liquids.  Pain control is an issue.  I increased her dilaudid for now.  2.  DVT prophylaxis - Lovenox 3.  Anemia, post op  Hgb - 8.9 - 09/30/2013 4.  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  She has been sick since then.  She had a BM yesterday. Objective:   Filed Vitals:   09/30/13 0738  BP: 138/80  Pulse: 108  Temp: 98 F (36.7 C)  Resp: 20     Intake/Output from previous day:  08/09 0701 - 08/10 0700 In: 1845 [P.O.:20; I.V.:805; TPN:1010] Out: 405 [Urine:400; Drains:5]  Intake/Output this shift:      Physical Exam:   General: WN AA F who is alert and oriented.    HEENT: Normal. Pupils equal. .   Lungs: Clear   Abdomen: Few BS.     Wound: Incision looks good.  LUQ Moss tube, clamped.  RUQ JP drain - only 5 cc.     Lab Results:    Recent Labs  09/29/13 0500 09/30/13 0515  WBC 12.6* 12.4*  HGB 8.9* 8.9*  HCT 27.3* 27.3*  PLT 461* 455*    BMET   Recent Labs  09/29/13 0500 09/30/13 0515  NA 130* 136*  K 4.6 4.6  CL 93* 97  CO2 26 28  GLUCOSE 192* 158*  BUN 16 14  CREATININE 0.65 0.77  CALCIUM 8.8 9.2    PT/INR  No results found for this basename: LABPROT, INR,  in the last 72 hours  ABG  No results found for this basename: PHART, PCO2, PO2, HCO3,  in the last 72 hours   Studies/Results:  Dg Chest Port 1 View  09/30/2013   CLINICAL  DATA:  PICC placement.  EXAM: PORTABLE CHEST - 1 VIEW  COMPARISON:  Chest radiograph performed 09/12/2013, and CTA of the chest performed 09/13/2013  FINDINGS: The lungs are well-aerated. Minimal right basilar atelectasis is noted. There is no evidence of pleural effusion or pneumothorax.  The cardiomediastinal silhouette is borderline enlarged. No acute osseous abnormalities are seen. A right PICC is noted ending about the distal SVC.  IMPRESSION: 1. Right PICC noted ending about the distal SVC. 2. Borderline cardiomegaly; minimal right basilar atelectasis noted.   Electronically Signed   By: Garald Balding M.D.   On: 09/30/2013 06:09     Anti-infectives:   Anti-infectives   Start     Dose/Rate Route Frequency Ordered Stop   09/20/13 1915  cefOXitin (MEFOXIN) 1 g in dextrose 5 % 50 mL IVPB     1 g 100 mL/hr over 30 Minutes Intravenous Every 6 hours 09/20/13 1903 09/21/13 0703   09/20/13 0600  [MAR Hold]  cefOXitin (MEFOXIN) 2 g in dextrose 5 % 50 mL IVPB     (On MAR Hold since 09/20/13 1215)  Comments:  Pharmacy may adjust dosing strength, interval, or rate  of medication as needed for optimal therapy for the patient Send with patient on call to the OR.  Anesthesia to complete antibiotic administration <23min prior to incision per [facility].   2 g 100 mL/hr over 30 Minutes Intravenous On call to O.R. 09/19/13 1119 09/20/13 1315      Alphonsa Overall, MD, FACS Pager: [facility] Office: [phone] 09/30/2013

## 2013-09-30 NOTE — Progress Notes (Signed)
PT Cancellation Note  Patient Details Name: Paula [name] MRN: [id number] DOB: 07/16/1965   Cancelled Treatment:    Reason Eval/Treat Not Completed: Other (comment) (pt stated she's already walked today. Will follow. )   Philomena Doheny 09/30/2013, 2:16 PM [phone]

## 2013-09-30 NOTE — Progress Notes (Signed)
Occupational Therapy Treatment Patient Details Name: ADASIA HOAR MRN: [id number] DOB: 05/08/1965 Today's Date: 09/30/2013    History of present illness 48 yo female adm  09/14/13 with Gastric carcinoma, s/p Distal gastrectomy 09/20/13; PMHx: costochondritis;         -- MRI shows resolving SDH; CT neg for PE, small infiltrate positive for R sided pna   OT comments  Pt tolerated increased activity this day   Follow Up Recommendations  Home health OT;Supervision/Assistance - 24 hour    Equipment Recommendations  3 in 1 bedside comode       Precautions / Restrictions Precautions Precautions: Fall Restrictions Weight Bearing Restrictions: No       Mobility Bed Mobility                  Transfers   Equipment used: Rolling walker (2 wheeled) Transfers: Sit to/from Stand Sit to Stand: Min guard         General transfer comment: minguard for safety due to multiple lines/tubes plus increased time.        ADL       Grooming: Oral care;Standing;Min guard                   Toilet Transfer: Min guard;RW   Toileting- Clothing Manipulation and Hygiene: Min guard;Sit to/from stand         General ADL Comments: Pt doing better this day. overall min guard with increased time with mobility                Cognition   Behavior During Therapy: WFL for tasks assessed/performed Overall Cognitive Status: Within Functional Limits for tasks assessed                               General Comments      Pertinent Vitals/ Pain       Pain Score: 8  Pain Location: abdomen Pain Intervention(s): Repositioned;Patient requesting pain meds-RN notified         Frequency Min 2X/week     Progress Toward Goals  OT Goals(current goals can now be found in the care plan section)  Progress towards OT goals: Progressing toward goals     Plan Discharge plan remains appropriate    Co-evaluation                 End of Session  Equipment Utilized During Treatment: Rolling walker   Activity Tolerance Patient tolerated treatment well   Patient Left in chair;with call bell/phone within reach;with family/visitor present   Nurse Communication Mobility status        Time: 0947-0962 OT Time Calculation (min): 24 min  Charges: OT General Charges $OT Visit: 1 Procedure OT Treatments $Self Care/Home Management : 23-37 mins  Ailene Royal, Thereasa Parkin 09/30/2013, 1:45 PM

## 2013-10-01 LAB — GLUCOSE, CAPILLARY
GLUCOSE-CAPILLARY: 135 mg/dL — AB (ref 70–99)
Glucose-Capillary: 137 mg/dL — ABNORMAL HIGH (ref 70–99)
Glucose-Capillary: 124 mg/dL — ABNORMAL HIGH (ref 70–99)

## 2013-10-01 MED ORDER — HYDROMORPHONE HCL PF 1 MG/ML IJ SOLN
1.0000 mg | INTRAMUSCULAR | Status: DC | PRN
  Administered 2013-10-01: 1 mg via INTRAVENOUS
  Filled 2013-10-01 (×2): qty 1

## 2013-10-01 MED ORDER — OXYCODONE HCL 5 MG PO TABS
5.0000 mg | ORAL_TABLET | ORAL | Status: DC | PRN
  Administered 2013-10-01 – 2013-10-04 (×2): 10 mg via ORAL
  Administered 2013-10-07 – 2013-10-09 (×4): 15 mg via ORAL
  Filled 2013-10-01 (×2): qty 2
  Filled 2013-10-01 (×6): qty 3

## 2013-10-01 MED ORDER — ONDANSETRON 4 MG PO TBDP
4.0000 mg | ORAL_TABLET | Freq: Three times a day (TID) | ORAL | Status: DC | PRN
  Filled 2013-10-01: qty 2

## 2013-10-01 MED ORDER — PROMETHAZINE HCL 25 MG/ML IJ SOLN: 6.2500 mg | Freq: Four times a day (QID) | INTRAMUSCULAR | Status: DC | PRN

## 2013-10-01 MED ORDER — PANTOPRAZOLE SODIUM 40 MG PO TBEC
40.0000 mg | DELAYED_RELEASE_TABLET | Freq: Two times a day (BID) | ORAL | Status: DC
  Administered 2013-10-01: 40 mg via ORAL
  Filled 2013-10-01 (×4): qty 1

## 2013-10-01 MED ORDER — FAT EMULSION 20 % IV EMUL
250.0000 mL | INTRAVENOUS | Status: AC
  Administered 2013-10-01: 250 mL via INTRAVENOUS
  Filled 2013-10-01: qty 250

## 2013-10-01 MED ORDER — ADULT MULTIVITAMIN W/MINERALS CH
1.0000 | ORAL_TABLET | Freq: Every day | ORAL | Status: DC
  Administered 2013-10-02: 1 via ORAL
  Filled 2013-10-01 (×2): qty 1

## 2013-10-01 MED ORDER — TRACE MINERALS CR-CU-F-FE-I-MN-MO-SE-ZN IV SOLN
INTRAVENOUS | Status: AC
  Administered 2013-10-01: 17:00:00 via INTRAVENOUS
  Filled 2013-10-01: qty 1000

## 2013-10-01 MED ORDER — PROMETHAZINE HCL 25 MG/ML IJ SOLN
25.0000 mg | Freq: Four times a day (QID) | INTRAMUSCULAR | Status: DC | PRN
  Administered 2013-10-01 – 2013-10-13 (×30): 25 mg via INTRAVENOUS
  Filled 2013-10-01 (×32): qty 1

## 2013-10-01 MED ORDER — HYDROMORPHONE HCL PF 1 MG/ML IJ SOLN
1.0000 mg | INTRAMUSCULAR | Status: DC | PRN
  Administered 2013-10-01 – 2013-10-04 (×18): 2 mg via INTRAVENOUS
  Filled 2013-10-01 (×19): qty 2

## 2013-10-01 NOTE — Progress Notes (Signed)
Pt has been refusing to take any PO pain medication. She is asking to have dilaudid change to 2mg   Every 2 hrs. I explained to pt that she needs to start taking by mouth pain med since we are trying to get her ready for discharge. She is upset and states she only wants IV dilaudid.   MD notified and will continue to encourage pt to take PO pain medication.Marland Kitchen

## 2013-10-01 NOTE — Progress Notes (Signed)
Physical Therapy Treatment Patient Details Name: Paula [name] MRN: [id number] DOB: 1965/10/27 Today's Date: 10/01/2013    History of Present Illness 48 yo female adm  09/14/13 with Gastric carcinoma, s/p Distal gastrectomy 09/20/13; PMHx: costochondritis;         -- MRI shows resolving SDH; CT neg for PE, small infiltrate positive for R sided pna    PT Comments    Progressing with mobility. Encouraged pt to walk with nursing/family as tolerated  Follow Up Recommendations  Home health PT     Equipment Recommendations  Rolling walker with 5" wheels    Recommendations for Other Services       Precautions / Restrictions Precautions Precautions: Fall Precaution Comments: JP drain/liquid diet Restrictions Weight Bearing Restrictions: No    Mobility  Bed Mobility                  Transfers Overall transfer level: Needs assistance Equipment used: Rolling walker (2 wheeled) Transfers: Sit to/from Stand Sit to Stand: Supervision         General transfer comment: Increased time.   Ambulation/Gait Ambulation/Gait assistance: Min guard Ambulation Distance (Feet): 500 Feet Assistive device: Rolling walker (2 wheeled) Gait Pattern/deviations: Step-through pattern;Trunk flexed;Decreased stride length     General Gait Details: close guard for safety. 2 brief standing rest breaks taken.    Stairs            Wheelchair Mobility    Modified Rankin (Stroke Patients Only)       Balance                                    Cognition Arousal/Alertness: Awake/alert Behavior During Therapy: WFL for tasks assessed/performed Overall Cognitive Status: Within Functional Limits for tasks assessed                      Exercises      General Comments        Pertinent Vitals/Pain Pain Assessment: 0-10 Pain Score: 4  Pain Location: abdomen Pain Intervention(s): Repositioned    Home Living                       Prior Function            PT Goals (current goals can now be found in the care plan section) Progress towards PT goals: Progressing toward goals    Frequency  Min 3X/week    PT Plan Current plan remains appropriate    Co-evaluation             End of Session   Activity Tolerance: Patient tolerated treatment well Patient left: in chair;with call bell/phone within reach     Time: 4034-7425 PT Time Calculation (min): 18 min  Charges:  $Gait Training: 8-22 mins                    G Codes:      Weston Anna, MPT Pager: [phone]

## 2013-10-01 NOTE — Progress Notes (Signed)
PARENTERAL NUTRITION CONSULT NOTE - Follow Up  Pharmacy Consult for TNA Indication: Gastric adenocarcinoma, unable to tolerate PO intake  No Known Allergies  Patient Measurements: Height: 5\' 5"  (165.1 cm) Weight: 158 lb 14.4 oz (72.077 kg) IBW/kg (Calculated) : 57 Adjusted Body Weight: 62 3-7 lb weight loss per RD notes   Vital Signs: Temp: 98.6 F (37 C) (08/11 8315) Temp src: Oral (08/11 1761) BP: 123/72 mmHg (08/11 6073) Pulse Rate: 107 (08/11 0632) Intake/Output from previous day: 08/10 0701 - 08/11 0700 In: 1950.8 [P.O.:150; I.V.:740.8; TPN:1060] Out: -  Intake/Output from this shift:    Labs:  Recent Labs  09/29/13 0500 09/30/13 0515  WBC 12.6* 12.4*  HGB 8.9* 8.9*  HCT 27.3* 27.3*  PLT 461* 455*     Recent Labs  09/29/13 0500 09/30/13 0510 09/30/13 0515  NA 130*  --  136*  K 4.6  --  4.6  CL 93*  --  97  CO2 26  --  28  GLUCOSE 192*  --  158*  BUN 16  --  14  CREATININE 0.65  --  0.77  CALCIUM 8.8  --  9.2  MG  --   --  1.8  PHOS  --   --  3.7  PROT  --   --  6.7  ALBUMIN  --   --  2.8*  AST  --   --  17  ALT  --   --  21  ALKPHOS  --   --  99  BILITOT  --   --  <0.2*  PREALBUMIN  --   --  14.5*  TRIG  --  59  --    Estimated Creatinine Clearance: 87.4 ml/min (by C-G formula based on Cr of 0.77).    Recent Labs  09/30/13 1144 09/30/13 1658 10/01/13 0733  GLUCAP 141* 127* 135*    Medications:  Scheduled:  . enoxaparin (LOVENOX) injection  40 mg Subcutaneous Q24H  . feeding supplement (RESOURCE BREEZE)  1 Container Oral Q24H  . insulin aspart  0-9 Units Subcutaneous TID WC  . metoprolol tartrate  50 mg Oral BID  . multivitamin with minerals  1 tablet Oral Daily  . pantoprazole (PROTONIX) IV  40 mg Intravenous Q12H  . polyethylene glycol  17 g Oral Daily  . traMADol  50 mg Oral 4 times per day   Infusions:  . sodium chloride 35 mL/hr at 10/01/13 0606  . Marland KitchenTPN (CLINIMIX-E) Adult 80 mL/hr at 09/30/13 1802   And  . fat emulsion  250 mL (09/30/13 1802)    Insulin Requirements in the past 24 hours:  4 units, sens SSI  Current Nutrition:  Changed back to NPO 8/9 due to vomiting, now back to CLD Advance to full liquids 8/11  Nutritional Goals:   Per RD assessment, 1800-2000 kCal,  85-95 grams of protein per day  Clinimix 5/15 at goal rate of 80 ml/hr + lipid to provide 96g protein and 1843 kcal/day  IVF:  NS @ 35 ml/hr  Lines:  PICC placed 7/30  Assessment: 48 yo female with newly diagnosed poorly differentiated gastric adenocarcinoma, s/p distal gastrectomy/GJ 7/31.  8/11: D#13 TNA.  POD11 for distal gastrectomy, feeding GJT placement. Patient having difficulty with oral intake so far, may need to start J-tube feedings if continues per CCS notes. Diet changed back to NPO for continued vomiting and pt refusal to take anything PO, but then advanced back to clears - tolerating some per RN. CCS advancing to  full liquids. Per discussion with CCS PA, begin weaning TNA today, if tolerates full liquids, will plan stopping in next 24-48 hrs.   Lytes: Na low but improved. All other lytes WNL.  Renal: stable, CrCl 87 ml/min  Hepatic: WNL   CBGs: now slightly above goal of less than 150   TGs: 101 (7/31), 75 (8/4), 59 (8/10)  Prealb: 12.7 (7/31), 9.3 (8/3), 8.8 (8/4), 14.5 (810)  Plan:   Decrease Clinimix E5/15 to 40 ml/hr   Decrease lipids to 5 ml/hr   Since not tolerating PO meds per RN, will continue MVI and trace elements in TNA daily  Mon/Thurs TNA labs  Continue IV fluids at 35 ml/hr  F/u tolerance of full liquids, wean and dc TNA when appropriate   Peggyann Juba, PharmD, BCPS Pager: [phone]  10/01/2013 10:10 AM
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
## 2013-10-01 NOTE — Progress Notes (Signed)
Pt took PO protonix per MD orders and began having chest pressure r/t her vomiting. Pt vomited about 200 ml of green emesis. Surgery MD contacted and verbal order given to connect pt's gastrostomy tube to gravity drainage to help with nausea. Gastrostomy tube connected to gravity drainage. At this time only about 37ml has drained.  Pt had already stopped vomiting at this time. Will continue to monitor.   Othella Boyer [facility] 10/01/2013 6:20 PM

## 2013-10-01 NOTE — Progress Notes (Signed)
Assumed care of pt at this time. Pt stable. Will continue to monitor.   Paula [name] [facility]

## 2013-10-01 NOTE — Progress Notes (Signed)
Walking up the hall with PT

## 2013-10-01 NOTE — Progress Notes (Signed)
11 Days Post-Op  Subjective: She looks and feels better than yesterday.  She seems to be tolerating PO's better.    Objective: Vital signs in last 24 hours: Temp:  [97.4 F (36.3 C)-98.6 F (37 C)] 98.6 F (37 C) (08/11 9753) Pulse Rate:  [100-107] 107 (08/11 0632) Resp:  [18-20] 18 (08/11 0051) BP: (120-143)/(72-85) 123/72 mmHg (08/11 0632) SpO2:  [100 %] 100 % (08/11 1021) Last BM Date: 09/29/13 1 episode of emesis yesterday,  No BM recorded yesterday. Nothing from the drain. We flushed the jejunostomy side of the Moss tube.   Clear diet. Intake/Output from previous day: 08/10 0701 - 08/11 0700 In: 1950.8 [P.O.:150; I.V.:740.8; TPN:1060] Out: -  Intake/Output this shift:    General appearance: alert, cooperative, no distress and very anxious GI: soft sore, + BS, no distension.  we pulled staples and drain.  Moss Tube site looks fine  Lab Results:   Recent Labs  09/29/13 0500 09/30/13 0515  WBC 12.6* 12.4*  HGB 8.9* 8.9*  HCT 27.3* 27.3*  PLT 461* 455*    BMET  Recent Labs  09/29/13 0500 09/30/13 0515  NA 130* 136*  K 4.6 4.6  CL 93* 97  CO2 26 28  GLUCOSE 192* 158*  BUN 16 14  CREATININE 0.65 0.77  CALCIUM 8.8 9.2   PT/INR No results found for this basename: LABPROT, INR,  in the last 72 hours   Recent Labs Lab 09/26/13 0350 09/30/13 0515  AST 19 17  ALT 23 21  ALKPHOS 74 99  BILITOT <0.2* <0.2*  PROT 6.2 6.7  ALBUMIN 2.7* 2.8*     Lipase     Component Value Date/Time   LIPASE 26 09/14/2013 2103     Studies/Results: Dg Chest Port 1 View  09/30/2013   CLINICAL DATA:  PICC placement.  EXAM: PORTABLE CHEST - 1 VIEW  COMPARISON:  Chest radiograph performed 09/12/2013, and CTA of the chest performed 09/13/2013  FINDINGS: The lungs are well-aerated. Minimal right basilar atelectasis is noted. There is no evidence of pleural effusion or pneumothorax.  The cardiomediastinal silhouette is borderline enlarged. No acute osseous abnormalities are  seen. A right PICC is noted ending about the distal SVC.  IMPRESSION: 1. Right PICC noted ending about the distal SVC. 2. Borderline cardiomegaly; minimal right basilar atelectasis noted.   Electronically Signed   By: Garald Balding M.D.   On: 09/30/2013 06:09    Medications: . enoxaparin (LOVENOX) injection  40 mg Subcutaneous Q24H  . feeding supplement (RESOURCE BREEZE)  1 Container Oral Q24H  . insulin aspart  0-9 Units Subcutaneous TID WC  . metoprolol tartrate  50 mg Oral BID  . multivitamin with minerals  1 tablet Oral Daily  . pantoprazole (PROTONIX) IV  40 mg Intravenous Q12H  . polyethylene glycol  17 g Oral Daily  . traMADol  50 mg Oral 4 times per day    Assessment/Plan 1. Gastric Carcinoma; S/p Diagnostic laparoscopy, distal gastrectomy with billroth II anastamosis and feeding gastrojejunostomy tube, [name], MD, 09/20/13.  Hx of intractable vomiting for 6 months prior to admission 2. Hypertension/tachycardia  3. Hx of migraine/resolving subdural hematoma  4. Back pain  5. Incontinence  6. PCM  7. Anemia  8. Post op ileus   Plan:  We gave her the option of draining her via the gastrotomy side and starting tube feedings; or starting full liquids.  She wants to try full liquids.  We will start weaning her TNA, and just see  how she does.  If she does well with full liquids she may be able to go home on just full liquids if she can maintain enough volume.   Staples and jp drain was removed today.  I am going to try to transition her over to PO Zofran, PO Protonix, and decrease her narcotics.  i have also stopped Reglan and Miralax  LOS: 17 days   JENNINGS,WILLARD 10/01/2013  Agree with above. I spoke to Dr. Barry Dienes about her findings.  Her tumor was not obstructing, but she had chronic nausea for almost a year pre op. She looks better today than yesterday.  Will try to advance her diet.  With her pre op history of chronic nausea (for more than 6 months), she may be very  slow to recover "normal" stomach function, if she recovers it all.  Alphonsa Overall, MD, [facility] Pager: [phone] Office phone:  [phone]

## 2013-10-02 ENCOUNTER — Inpatient Hospital Stay (HOSPITAL_COMMUNITY): Payer: No Typology Code available for payment source

## 2013-10-02 LAB — COMPREHENSIVE METABOLIC PANEL
ALT: 111 U/L — AB (ref 0–35)
ANION GAP: 11 (ref 5–15)
AST: 64 U/L — ABNORMAL HIGH (ref 0–37)
Albumin: 3 g/dL — ABNORMAL LOW (ref 3.5–5.2)
Alkaline Phosphatase: 156 U/L — ABNORMAL HIGH (ref 39–117)
BUN: 15 mg/dL (ref 6–23)
CALCIUM: 9.3 mg/dL (ref 8.4–10.5)
CO2: 27 meq/L (ref 19–32)
Chloride: 97 mEq/L (ref 96–112)
Creatinine, Ser: 0.86 mg/dL (ref 0.50–1.10)
GFR calc Af Amer: >90 mL/min (ref >90)
GFR, EST NON AFRICAN AMERICAN: 80 mL/min — AB (ref >90)
GLUCOSE: 114 mg/dL — AB (ref 70–99)
Potassium: 4.3 mEq/L (ref 3.7–5.3)
SODIUM: 135 meq/L — AB (ref 137–147)
Total Bilirubin: 0.4 mg/dL (ref 0.3–1.2)
Total Protein: 7 g/dL (ref 6.0–8.3)

## 2013-10-02 LAB — GLUCOSE, CAPILLARY
GLUCOSE-CAPILLARY: 105 mg/dL — AB (ref 70–99)
GLUCOSE-CAPILLARY: 107 mg/dL — AB (ref 70–99)
Glucose-Capillary: 131 mg/dL — ABNORMAL HIGH (ref 70–99)
Glucose-Capillary: 106 mg/dL — ABNORMAL HIGH (ref 70–99)

## 2013-10-02 MED ORDER — FAT EMULSION 20 % IV EMUL
250.0000 mL | INTRAVENOUS | Status: AC
  Administered 2013-10-02: 250 mL via INTRAVENOUS
  Filled 2013-10-02: qty 250

## 2013-10-02 MED ORDER — TRACE MINERALS CR-CU-F-FE-I-MN-MO-SE-ZN IV SOLN
INTRAVENOUS | Status: AC
  Administered 2013-10-02: 18:00:00 via INTRAVENOUS
  Filled 2013-10-02: qty 1000

## 2013-10-02 MED ORDER — PANTOPRAZOLE SODIUM 40 MG IV SOLR
40.0000 mg | Freq: Two times a day (BID) | INTRAVENOUS | Status: DC
  Administered 2013-10-02 – 2013-10-03 (×4): 40 mg via INTRAVENOUS
  Filled 2013-10-02 (×6): qty 40

## 2013-10-02 MED ORDER — METOCLOPRAMIDE HCL 5 MG/ML IJ SOLN
5.0000 mg | Freq: Three times a day (TID) | INTRAMUSCULAR | Status: DC
  Administered 2013-10-02 – 2013-10-04 (×8): 5 mg via INTRAVENOUS
  Filled 2013-10-02 (×2): qty 2
  Filled 2013-10-02 (×4): qty 1
  Filled 2013-10-02 (×2): qty 2
  Filled 2013-10-02: qty 1

## 2013-10-02 MED ORDER — IOHEXOL 300 MG/ML  SOLN
150.0000 mL | Freq: Once | INTRAMUSCULAR | Status: AC | PRN
  Administered 2013-10-02: 150 mL via ORAL

## 2013-10-02 MED ORDER — OSMOLITE 1.2 CAL PO LIQD: 1000.0000 mL | ORAL | Status: DC

## 2013-10-02 NOTE — Progress Notes (Signed)
12 Days Post-Op  Subjective: She is very anxious.  Still upset about a change in her pain medication.  Anxious over taking PO pain medicines;  She was afraid we were making her go cold [place].  Said she did not eat at all yesterday.  When she vomited she was placed on straight drain and she said it made the gastrotomy site very painful, and she points to the LUQ.  She is very anxious about even moving in bed because of the pain with movement.  We also left some staples in the lower abdomen yesterday, and I have removed those.  Her drain site and her gastrostomy site both are fine, she still has some drainage at the drain site where we took out the drain yesterday.  Objective: Vital signs in last 24 hours: Temp:  [97.5 F (36.4 C)-98.9 F (37.2 C)] 97.5 F (36.4 C) (08/12 0542) Pulse Rate:  [98-122] 98 (08/12 0542) Resp:  [16-18] 18 (08/12 0542) BP: (120-147)/(73-82) 120/75 mmHg (08/12 0542) SpO2:  [100 %] 100 % (08/12 0542) Last BM Date: 09/29/13 20 PO recorded Pt vomited and she was placed on gravity drain to gastrostomy tube.  It drained 100 ml, pt complained and ask that it be clamped because it hurt her side. Intake/Output from previous day: 08/11 0701 - 08/12 0700 In: 20 [P.O.:20] Out: 800 [Urine:700; Drains:100] Intake/Output this shift:    General appearance: alert, cooperative and VERY ANXIOUS, she was even upset with flattening the bed some so I could see her abdomen. Resp: clear to auscultation bilaterally and decreased in the bases some. GI: she is tender all over, she compains of pain from the weight of the foley tubing for straight drainage of her gastrostomy. Tender at the incision sites.  Wounds are all healing nicely.  I think we have all the staples out.  She reports some wet flatus yesterday, no BM,  she has very hypoactive bowel sounds.  Lab Results:   Recent Labs  09/30/13 0515  WBC 12.4*  HGB 8.9*  HCT 27.3*  PLT 455*    BMET  Recent Labs  09/30/13 0515  10/02/13 0415  NA 136* 135*  K 4.6 4.3  CL 97 97  CO2 28 27  GLUCOSE 158* 114*  BUN 14 15  CREATININE 0.77 0.86  CALCIUM 9.2 9.3   PT/INR No results found for this basename: LABPROT, INR,  in the last 72 hours   Recent Labs Lab 09/26/13 0350 09/30/13 0515 10/02/13 0415  AST 19 17 64*  ALT 23 21 111*  ALKPHOS 74 99 156*  BILITOT <0.2* <0.2* 0.4  PROT 6.2 6.7 7.0  ALBUMIN 2.7* 2.8* 3.0*     Lipase     Component Value Date/Time   LIPASE 26 09/14/2013 2103     Studies/Results: No results found.  Medications: . enoxaparin (LOVENOX) injection  40 mg Subcutaneous Q24H  . feeding supplement (RESOURCE BREEZE)  1 Container Oral Q24H  . insulin aspart  0-9 Units Subcutaneous TID WC  . metoprolol tartrate  50 mg Oral BID  . multivitamin with minerals  1 tablet Oral Q supper  . pantoprazole  40 mg Oral BID AC  . traMADol  50 mg Oral 4 times per day    Assessment/Plan 1. Gastric Carcinoma; S/p Diagnostic laparoscopy, distal gastrectomy with billroth II anastamosis and feeding gastrojejunostomy tube, [name], MD, 09/20/13.  Hx of intractable vomiting for 6 months prior to admission  2. Hypertension/tachycardia  3. Hx of migraine/resolving subdural hematoma  4. Back pain  5. Incontinence  6. PCM  7. Anemia  8. Post op ileus 9. Ongoing post op nausea and vomiting 10.  Pain control   Plan:  I have discussed this with Dr. Lucia Gaskins.  We are going to get an UGI this AM.  I am going to leave her pain medicines as they are.  She is very anxious about any decrease.  We are going to continue the Reglan for motility, go back to IV Protonix.  I will not decrease her TNA further pending the study.   We may have to start using the  [place], but I will wait to see the study before asking nutrition to evaluate for TF.  TNA labs are pending for tomorrow.     LOS: 18 days    JENNINGS,WILLARD 10/02/2013  Agree with above. Her UGI looked okay - the stomach is emptying. The plan is to  get her on enteral feedings (oral alone or supplemented with j tube) so we can stop her TPN, then switch to oral pain meds, then possibly home. Her mother and cousin are in the room.  Her cousin has had breast cancer.  Alphonsa Overall, MD, [facility] Pager: [phone] Office phone:  [phone]
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
## 2013-10-02 NOTE — Progress Notes (Signed)
OT Cancellation Note  Patient Details Name: MELVINIA ASHBY MRN: [id number] DOB: January 31, 1966   Cancelled Treatment:    Reason Eval/Treat Not Completed: Patient at procedure or test/ unavailable. Pt getting ready to go to a test. Will try back another time.  Jules Schick [phone] 10/02/2013, 9:57 AM

## 2013-10-02 NOTE — Progress Notes (Signed)
Patient transferred to 1343. No distress noted, alert and oriented, denies any pain/distress. Surgical incision clean/dry/intact, no signs of infection noted, no pressure ulcer noted. Reported off to Elsie-RN.
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
## 2013-10-02 NOTE — Progress Notes (Signed)
Gastrostomy tube drained 100 ml bile colored output to gravity drain. Patient has been complaining about a lot of pain above the gastrotomy site. Patient requested for me to clamped it, complained that the she feels like the drainage bag is pulling the tube giving her discomfort and pain.

## 2013-10-02 NOTE — Progress Notes (Signed)
PARENTERAL NUTRITION CONSULT NOTE - Follow Up  Pharmacy Consult for TNA Indication: Gastric adenocarcinoma, unable to tolerate PO intake  No Known Allergies  Patient Measurements: Height: 5' 5" (165.1 cm) Weight: 158 lb 14.4 oz (72.077 kg) IBW/kg (Calculated) : 57 Adjusted Body Weight: 62 3-7 lb weight loss per RD notes   Vital Signs: Temp: 97.5 F (36.4 C) (08/12 0542) Temp src: Oral (08/12 0542) BP: 114/72 mmHg (08/12 0929) Pulse Rate: 90 (08/12 0929) Intake/Output from previous day: 08/11 0701 - 08/12 0700 In: 20 [P.O.:20] Out: 800 [Urine:700; Drains:100] Intake/Output from this shift:    Labs:  Recent Labs  09/30/13 0515  WBC 12.4*  HGB 8.9*  HCT 27.3*  PLT 455*     Recent Labs  09/30/13 0510 09/30/13 0515 10/02/13 0415  NA  --  136* 135*  K  --  4.6 4.3  CL  --  97 97  CO2  --  28 27  GLUCOSE  --  158* 114*  BUN  --  14 15  CREATININE  --  0.77 0.86  CALCIUM  --  9.2 9.3  MG  --  1.8  --   PHOS  --  3.7  --   PROT  --  6.7 7.0  ALBUMIN  --  2.8* 3.0*  AST  --  17 64*  ALT  --  21 111*  ALKPHOS  --  99 156*  BILITOT  --  <0.2* 0.4  PREALBUMIN  --  14.5*  --   TRIG 59  --   --    Estimated Creatinine Clearance: 81.3 ml/min (by C-G formula based on Cr of 0.86).    Recent Labs  10/01/13 1127 10/01/13 1603 10/02/13 0744  GLUCAP 137* 124* 131*    Medications:  Scheduled:  . enoxaparin (LOVENOX) injection  40 mg Subcutaneous Q24H  . feeding supplement (RESOURCE BREEZE)  1 Container Oral Q24H  . insulin aspart  0-9 Units Subcutaneous TID WC  . metoCLOPramide (REGLAN) injection  5 mg Intravenous 3 times per day  . metoprolol tartrate  50 mg Oral BID  . multivitamin with minerals  1 tablet Oral Q supper  . pantoprazole (PROTONIX) IV  40 mg Intravenous Q12H  . traMADol  50 mg Oral 4 times per day   Infusions:  . sodium chloride 35 mL/hr at 10/02/13 0931  . Marland KitchenTPN (CLINIMIX-E) Adult 40 mL/hr at 10/01/13 1718   And  . fat emulsion 250 mL  (10/01/13 1718)    Insulin Requirements in the past 24 hours:  3 units, sens SSI  Current Nutrition:  Changed back to NPO 8/9 due to vomiting, now back to CLD Advance to full liquids 8/11  Nutritional Goals:   Per RD assessment, 1800-2000 kCal,  85-95 grams of protein per day  Clinimix 5/15 at goal rate of 80 ml/hr + lipid to provide 96g protein and 1843 kcal/day  IVF:  NS @ 35 ml/hr  Lines:  PICC placed 7/30  Assessment: 48 yo female with newly diagnosed poorly differentiated gastric adenocarcinoma, s/p distal gastrectomy/GJ 7/31.  8/12: D#14 TNA.  POD12 for distal gastrectomy, feeding GJT placement. Patient having difficulty with oral intake so far, may need to start J-tube feedings if continues per CCS notes. Diet changed back to NPO for continued vomiting and pt refusal to take anything PO, but then advanced back to clears - tolerating some per RN. CCS advancing to full liquids. TNA was weaned to half of goal calorie rate yesterday to assess  diet advancement. Pt experienced recurrent N/V overnight and placed on gravity drain to gastrostomy tube, then pt complained of pain and tube clamped.  Noted plan for UGI study, continuing reglan, and changing protonix to IV.  No further decrease in TNA pending results of study.    Lytes: Na low but improved. All other lytes WNL.  Renal: stable, CrCl 87 ml/min  Hepatic: Increase in AST/ALT and alk phos   CBGs: at goal <150   TGs: 101 (7/31), 75 (8/4), 59 (8/10)  Prealb: 12.7 (7/31), 9.3 (8/3), 8.8 (8/4), 14.5 (810)  Plan:   Continue Clinimix E5/15 at 40 ml/hr and lipids at 5 ml/hr   Since not tolerating PO meds per RN, will continue MVI and trace elements in TNA daily  Mon/Thurs TNA labs  Continue IV fluids at 35 ml/hr  F/u results of UGI study, tolerance of full liquids, wean and dc TNA when appropriate  Ralene Bathe, PharmD, BCPS 10/02/2013, 11:11 AM  Pager: [phone]

## 2013-10-03 LAB — COMPREHENSIVE METABOLIC PANEL
ALT: 98 U/L — AB (ref 0–35)
AST: 48 U/L — AB (ref 0–37)
Albumin: 2.6 g/dL — ABNORMAL LOW (ref 3.5–5.2)
Alkaline Phosphatase: 168 U/L — ABNORMAL HIGH (ref 39–117)
Anion gap: 9 (ref 5–15)
BUN: 14 mg/dL (ref 6–23)
CHLORIDE: 98 meq/L (ref 96–112)
CO2: 29 mEq/L (ref 19–32)
Calcium: 9 mg/dL (ref 8.4–10.5)
Creatinine, Ser: 0.84 mg/dL (ref 0.50–1.10)
GFR calc Af Amer: >90 mL/min (ref >90)
GFR, EST NON AFRICAN AMERICAN: 82 mL/min — AB (ref >90)
GLUCOSE: 135 mg/dL — AB (ref 70–99)
Potassium: 4.5 mEq/L (ref 3.7–5.3)
Sodium: 136 mEq/L — ABNORMAL LOW (ref 137–147)
Total Bilirubin: 0.4 mg/dL (ref 0.3–1.2)
Total Protein: 6.3 g/dL (ref 6.0–8.3)

## 2013-10-03 LAB — GLUCOSE, CAPILLARY
Glucose-Capillary: 124 mg/dL — ABNORMAL HIGH (ref 70–99)
Glucose-Capillary: 111 mg/dL — ABNORMAL HIGH (ref 70–99)
Glucose-Capillary: 111 mg/dL — ABNORMAL HIGH (ref 70–99)
Glucose-Capillary: 112 mg/dL — ABNORMAL HIGH (ref 70–99)

## 2013-10-03 LAB — PHOSPHORUS: Phosphorus: 4.4 mg/dL (ref 2.3–4.6)

## 2013-10-03 LAB — MAGNESIUM: Magnesium: 1.8 mg/dL (ref 1.5–2.5)

## 2013-10-03 MED ORDER — TRACE MINERALS CR-CU-F-FE-I-MN-MO-SE-ZN IV SOLN
INTRAVENOUS | Status: AC
  Administered 2013-10-03: 18:00:00 via INTRAVENOUS
  Filled 2013-10-03: qty 1000

## 2013-10-03 MED ORDER — OSMOLITE 1.2 CAL PO LIQD
1000.0000 mL | ORAL | Status: DC
  Administered 2013-10-03: 1000 mL
  Filled 2013-10-03: qty 1000

## 2013-10-03 MED ORDER — VITAMINS A & D EX OINT
TOPICAL_OINTMENT | CUTANEOUS | Status: AC
  Administered 2013-10-03: 09:00:00
  Filled 2013-10-03: qty 5

## 2013-10-03 MED ORDER — FAT EMULSION 20 % IV EMUL
250.0000 mL | INTRAVENOUS | Status: AC
  Administered 2013-10-03: 250 mL via INTRAVENOUS
  Filled 2013-10-03: qty 250
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
## 2013-10-03 NOTE — Progress Notes (Signed)
OT Cancellation Note  Patient Details Name: Paula [name] MRN: [id number] DOB: [birth year]   Cancelled Treatment:    Reason Eval/Treat Not Completed: Other (comment) Checked on pt and nutritionist reports issue with tube feeding connection. Will try back tomorrow.  Jules Schick [phone] 10/03/2013, 3:06 PM

## 2013-10-03 NOTE — Progress Notes (Addendum)
Patient ID: Paula [name], female   DOB: 1965/06/26, 48 y.o.   MRN: [id number] 13 Days Post-Op  Subjective: Patient feels okay today. She complains of some chronic left sided/left flank chest and abdominal pain. She states this has been present for the last year. She also complains of some "knots" on her left forearm. Otherwise she is tolerating clear liquids with no emesis and minimal nausea.  Objective: Vital signs in last 24 hours: Temp:  [97.7 F (36.5 C)-98.5 F (36.9 C)] 98.5 F (36.9 C) (08/13 0523) Pulse Rate:  [88-99] 96 (08/13 0523) Resp:  [16-18] 16 (08/13 0523) BP: (103-114)/(61-72) 103/61 mmHg (08/13 0523) SpO2:  [100 %] 100 % (08/13 0523) Weight:  [155 lb 6.8 oz (70.5 kg)] 155 lb 6.8 oz (70.5 kg) (08/12 1239) Last BM Date: 09/29/13  Intake/Output from previous day: 08/12 0701 - 08/13 0700 In: 22808 [P.O.:220; I.V.:1699.1; WCH:85277.8] Out: 1250 [Urine:1250] Intake/Output this shift:    PE: Abd: Soft, appropriately tender, positive bowel sounds, nondistended, incision is well-healed. GJ tube is in place.  Lab Results:  No results found for this basename: WBC, HGB, HCT, PLT,  in the last 72 hours BMET  Recent Labs  10/02/13 0415 10/03/13 0500  NA 135* 136*  K 4.3 4.5  CL 97 98  CO2 27 29  GLUCOSE 114* 135*  BUN 15 14  CREATININE 0.86 0.84  CALCIUM 9.3 9.0   PT/INR No results found for this basename: LABPROT, INR,  in the last 72 hours CMP     Component Value Date/Time   NA 136* 10/03/2013 0500   K 4.5 10/03/2013 0500   CL 98 10/03/2013 0500   CO2 29 10/03/2013 0500   GLUCOSE 135* 10/03/2013 0500   BUN 14 10/03/2013 0500   CREATININE 0.84 10/03/2013 0500   CALCIUM 9.0 10/03/2013 0500   PROT 6.3 10/03/2013 0500   ALBUMIN 2.6* 10/03/2013 0500   AST 48* 10/03/2013 0500   ALT 98* 10/03/2013 0500   ALKPHOS 168* 10/03/2013 0500   BILITOT 0.4 10/03/2013 0500   GFRNONAA 82* 10/03/2013 0500   GFRAA >90 10/03/2013 0500   Lipase     Component Value  Date/Time   LIPASE 26 09/14/2013 2103       Studies/Results: Dg Ugi W/water Sol Cm  10/02/2013   CLINICAL DATA:  Status post Billroth 2 for gastric carcinoma 2 weeks ago. Persistent nausea and vomiting.  EXAM: WATER SOLUBLE UPPER GI SERIES  TECHNIQUE: Single-column upper GI series was performed using water soluble contrast. Preprocedure scout film was also performed.  CONTRAST:  124mL OMNIPAQUE IOHEXOL 300 MG/ML  SOLN  COMPARISON:  09/26/2013 acute abdomen series.  CT of 09/13/2013.  FLUOROSCOPY TIME:  2 min and 45 seconds  FINDINGS: Preprocedure scout film demonstrates a left-sided gastrojejunostomy tube. No free intraperitoneal air. Non-obstructive bowel gas pattern.  Focused single-contrast exam, with the patient nearly upright (unable to be positioned supine secondary to discomfort). Normal postoperative appearance of the residual stomach. Contrast traverses the gastrojejunostomy site, without extravasation or obstruction. Normal caliber of proximal small bowel loops.  IMPRESSION: Expected appearance after gastrojejunostomy, without perforation or bowel obstruction.   Electronically Signed   By: Abigail Miyamoto M.D.   On: 10/02/2013 11:46    Anti-infectives: Anti-infectives   Start     Dose/Rate Route Frequency Ordered Stop   09/20/13 1915  cefOXitin (MEFOXIN) 1 g in dextrose 5 % 50 mL IVPB     1 g 100 mL/hr over 30 Minutes Intravenous Every 6 hours  09/20/13 1903 09/21/13 0703   09/20/13 0600  [MAR Hold]  cefOXitin (MEFOXIN) 2 g in dextrose 5 % 50 mL IVPB     (On MAR Hold since 09/20/13 1215)  Comments:  Pharmacy may adjust dosing strength, interval, or rate of medication as needed for optimal therapy for the patient Send with patient on call to the OR.  Anesthesia to complete antibiotic administration <46min prior to incision per [facility].   2 g 100 mL/hr over 30 Minutes Intravenous On call to O.R. 09/19/13 1119 09/20/13 1315       Assessment/Plan  1. POD 13,  Gastric Carcinoma; S/p  Diagnostic laparoscopy, distal gastrectomy with billroth II anastamosis and feeding gastrojejunostomy tube, [name], MD - 09/20/13.   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 Hypertension/tachycardia  3. Hx of migraine/resolving subdural hematoma  4. Back pain  5. Incontinence  6. PCM, TNA 7. Anemia  8. Ongoing post op nausea and vomiting  9. Pain control   Plan: 1. I will initiate Osmolite tube feeds today at extracorporeal 10 cc an hour. She is doing well with her clear liquids. Therefore I'll increase her to full liquids. I do not want to increase her tube feeds significantly as we are increasing her diet as well today. I do not want to overwhelmed her gut. We will cut her TNA in half today. 2. Continue IV pain medicine today. If the patient begins to tolerate her tube feeds and full liquids, then hopefully we can start initiating oral medications tomorrow in preparation for discharge home soon hopefully.  LOS: 19 days    OSBORNE,KELLY E 10/03/2013, 7:52 AM Pager: [phone]  Agree with above. They were having some trouble with the adapter to the J tube, but I think that I got it fixed. She has a foul smell to her breath - this is probably in part from the chronic regurgitation.  Alphonsa Overall, MD, [facility] Pager: [phone] Office phone:  [phone]

## 2013-10-03 NOTE — Progress Notes (Signed)
MD came to assess pt. He hooked pt. J-tube to continuous tube feedings.  MD stated to place tape around site around connection to ensure hold.  RN started continuous feedings per order.  Will continue to monitor.
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
## 2013-10-03 NOTE — Progress Notes (Signed)
PARENTERAL NUTRITION CONSULT NOTE - Follow Up  Pharmacy Consult for TNA Indication: Gastric adenocarcinoma, s/p distal gastrectomy with Billroth II anastomosis and feeding gastrojejunostomy tube placement   No Known Allergies  Patient Measurements: Height: 5' (152.4 cm) Weight: 155 lb 6.8 oz (70.5 kg) IBW/kg (Calculated) : 45.5 Adjusted Body Weight: 62 3-7 lb weight loss per RD notes   Vital Signs: Temp: 98.5 F (36.9 C) (08/13 0523) Temp src: Oral (08/13 0523) BP: 103/61 mmHg (08/13 0523) Pulse Rate: 96 (08/13 0523) Intake/Output from previous day: 08/12 0701 - 08/13 0700 In: 22808 [P.O.:220; I.V.:1699.1; WVP:71062.6] Out: 1250 [Urine:1250] (Recorded intake for 8/12 appears inaccurate)  Intake/Output from this shift:    Labs: No results found for this basename: WBC, HGB, HCT, PLT, APTT, INR,  in the last 72 hours   Recent Labs  10/02/13 0415 10/03/13 0500  NA 135* 136*  K 4.3 4.5  CL 97 98  CO2 27 29  GLUCOSE 114* 135*  BUN 15 14  CREATININE 0.86 0.84  CALCIUM 9.3 9.0  MG  --  1.8  PHOS  --  4.4  PROT 7.0 6.3  ALBUMIN 3.0* 2.6*  AST 64* 48*  ALT 111* 98*  ALKPHOS 156* 168*  BILITOT 0.4 0.4  Corrected calcium: 10.1 Estimated Creatinine Clearance: 73.3 ml/min (by C-G formula based on Cr of 0.84).    Recent Labs  10/02/13 1728 10/02/13 2211 10/03/13 0804  GLUCAP 106* 107* 124*    Medications:  Scheduled:  . enoxaparin (LOVENOX) injection  40 mg Subcutaneous Q24H  . feeding supplement (RESOURCE BREEZE)  1 Container Oral Q24H  . insulin aspart  0-9 Units Subcutaneous TID WC  . metoCLOPramide (REGLAN) injection  5 mg Intravenous 3 times per day  . metoprolol tartrate  50 mg Oral BID  . multivitamin with minerals  1 tablet Oral Q supper  . pantoprazole (PROTONIX) IV  40 mg Intravenous Q12H  . traMADol  50 mg Oral 4 times per day  . [COMPLETED] vitamin A & D       Infusions:  . sodium chloride 35 mL/hr at 10/03/13 0604  . Marland KitchenTPN (CLINIMIX-E) Adult  40 mL/hr at 10/03/13 0522   And  . fat emulsion 250 mL (10/03/13 0522)  . feeding supplement (OSMOLITE 1.2 CAL)      Insulin Requirements in the past 24 hours:  2 units Novolog; on sensitive-scale SSI TID   Current Nutrition:  Clinimix-E 5/15 at 40 mL/hr Lipids 20% at 5 mL/hr Beginning Osmolite 1.2 at 10 mL/hr this AM via feeding tube Advancing from clear liquids to full liquids this AM  Nutritional Goals:   Per RD assessment, 1800-2000 kCal,  85-95 grams of protein per day.    Osmolite 1.2 at goal rate of 65 mL/hr would provide 86g protein and 1872 kcal/day  Clinimix 5/15 at goal rate of 80 ml/hr + lipids 20% at 10 mL/hr would provide 96g protein and 1843 kcal/day   IVF:  NS @ 35 ml/hr  Lines:  PICC placed 7/30  Assessment: 48 yo female with newly diagnosed poorly differentiated gastric adenocarcinoma, s/p distal gastrectomy with feeding gastrojejunostomy placement 7/31.  TNA was started on 7/30 for nutritional support until able to use enteral / oral routes.  8/13:  D#15 TNA.   POD #13   Tolerated clear liquid diet yesterday  Labs:  Lytes: Na low but improved. All other lytes WNL.  Renal: SCr WNL, stable  Hepatic: Alk phos remains elevated but <2x ULN.  Transaminases improving. Bilirubin  remains WNL.  CBGs: at goal <150   TG: remains WNL (last checked 8/10)  Prealb: improving on last check.  Trend: 12.7 (7/31), 9.3 (8/3), 8.8 (8/4), 14.5 (8/10)  Orders received from surgery to reduce Clinimix rate by 1/2 again today, begin Osmolite at fixed low rate, and advance diet to full liquids.  Plan:   When next bag begins at tonight at 1800, reduce Clinimix to 20 mL/hr per surgery.  Continue lipids at 5 mL/hr.  Begin Osmolite 1.2 mL/hr at fixed rate of 10 mL/hr per surgery.  Full liquid diet per surgery.  Continue MVI and trace elements in TNA for today; hope to convert back to PO tomorrow.  F/U on tolerance of diet and enteral feedings.   Await further  updates from surgery on potential timing of TNA discontinuation.  Clayburn Pert, PharmD, BCPS Pager: [phone] 10/03/2013  9:08 AM

## 2013-10-03 NOTE — Progress Notes (Signed)
Tube feedings were ordered per surgical PA. PA stated that only the J-tube port labeled feedings should be used to administer tube feedings.  RN was unable to locate adapter that was of the right size so that tube feedings could be connected.  PA was notified and stated that tube feedings could be held until tomorrow when she could come back and assess.  Pt. Informed will continue to monitor.

## 2013-10-03 NOTE — Progress Notes (Signed)
PT Cancellation Note  Patient Details Name: Paula [name] MRN: [id number] DOB: 10/19/1965   Cancelled Treatment:    Reason Eval/Treat Not Completed: 2 attempts to work with pt on today. Pt (on 1st attempt) and nutritionist (on 2nd attempt) both report issue with tube feeding connection. Pt requested PT check back after issue has been resolved. Will check back on tomorrow. Thanks.    Weston Anna, MPT Pager: [phone]

## 2013-10-04 LAB — GLUCOSE, CAPILLARY: GLUCOSE-CAPILLARY: 126 mg/dL — AB (ref 70–99)

## 2013-10-04 LAB — CREATININE, SERUM
Creatinine, Ser: 0.88 mg/dL (ref 0.50–1.10)
GFR, EST AFRICAN AMERICAN: 90 mL/min — AB (ref >90)
GFR, EST NON AFRICAN AMERICAN: 78 mL/min — AB (ref >90)

## 2013-10-04 MED ORDER — METOCLOPRAMIDE HCL 5 MG PO TABS
5.0000 mg | ORAL_TABLET | Freq: Three times a day (TID) | ORAL | Status: DC
  Administered 2013-10-04 – 2013-10-09 (×10): 5 mg via ORAL
  Filled 2013-10-04 (×17): qty 1

## 2013-10-04 MED ORDER — ONDANSETRON HCL 4 MG/2ML IJ SOLN
4.0000 mg | Freq: Four times a day (QID) | INTRAMUSCULAR | Status: DC | PRN
  Administered 2013-10-04 – 2013-10-11 (×8): 4 mg via INTRAVENOUS
  Filled 2013-10-04 (×8): qty 2

## 2013-10-04 MED ORDER — ACETAMINOPHEN 500 MG PO TABS
1000.0000 mg | ORAL_TABLET | Freq: Four times a day (QID) | ORAL | Status: DC | PRN
  Administered 2013-10-06: 1000 mg via ORAL
  Filled 2013-10-04: qty 2

## 2013-10-04 MED ORDER — ENSURE COMPLETE PO LIQD
237.0000 mL | Freq: Two times a day (BID) | ORAL | Status: DC
  Administered 2013-10-04 – 2013-10-06 (×3): 237 mL via ORAL

## 2013-10-04 MED ORDER — ADULT MULTIVITAMIN W/MINERALS CH
1.0000 | ORAL_TABLET | ORAL | Status: DC
  Administered 2013-10-06 – 2013-10-08 (×3): 1 via ORAL
  Filled 2013-10-04 (×8): qty 1

## 2013-10-04 MED ORDER — BOOST / RESOURCE BREEZE PO LIQD
1.0000 | Freq: Every day | ORAL | Status: DC
  Administered 2013-10-04 – 2013-10-06 (×2): 1 via ORAL

## 2013-10-04 MED ORDER — HYDROMORPHONE HCL PF 1 MG/ML IJ SOLN
0.5000 mg | INTRAMUSCULAR | Status: DC | PRN
  Administered 2013-10-04 – 2013-10-05 (×5): 1 mg via INTRAVENOUS
  Filled 2013-10-04 (×5): qty 1

## 2013-10-04 MED ORDER — PANTOPRAZOLE SODIUM 40 MG PO TBEC
40.0000 mg | DELAYED_RELEASE_TABLET | Freq: Two times a day (BID) | ORAL | Status: DC
  Administered 2013-10-04 (×2): 40 mg via ORAL
  Filled 2013-10-04 (×3): qty 1

## 2013-10-04 MED ORDER — ACETAMINOPHEN 650 MG RE SUPP: 650.0000 mg | Freq: Four times a day (QID) | RECTAL | Status: DC | PRN

## 2013-10-04 MED ORDER — HYDROMORPHONE HCL PF 1 MG/ML IJ SOLN: 0.5000 mg | INTRAMUSCULAR | Status: DC | PRN

## 2013-10-04 NOTE — Progress Notes (Signed)
Pt. Called RN into room. Pt. State that she was feeling full and like she was going to vomit.  Pt. Requested that tube feedings be stopped. RN stopped tube feedings and disconnected pt. From feeding pump.  Surgical PA was paged for update.  Pt. Vomited 150cc's of green vomit.  Will continue to monitor.
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
## 2013-10-04 NOTE — Progress Notes (Signed)
PARENTERAL NUTRITION CONSULT NOTE - Follow Up  Pharmacy Consult for TNA Indication: Gastric adenocarcinoma, s/p distal gastrectomy with Billroth II anastomosis and feeding gastrojejunostomy tube placement   No Known Allergies  Patient Measurements: Height: 5' (152.4 cm) Weight: 155 lb 6.8 oz (70.5 kg) IBW/kg (Calculated) : 45.5 Adjusted Body Weight: 62 3-7 lb weight loss per RD notes   Vital Signs: Temp: 97.9 F (36.6 C) (08/14 0512) Temp src: Oral (08/14 0512) BP: 122/74 mmHg (08/14 0512) Pulse Rate: 100 (08/14 0512) Intake/Output from previous day: 08/13 0701 - 08/14 0700 In: -  Out: 1100 [Urine:1100]   Intake/Output from this shift:    Labs: No results found for this basename: WBC, HGB, HCT, PLT, APTT, INR,  in the last 72 hours   Recent Labs  10/02/13 0415 10/03/13 0500 10/04/13 0530  NA 135* 136*  --   K 4.3 4.5  --   CL 97 98  --   CO2 27 29  --   GLUCOSE 114* 135*  --   BUN 15 14  --   CREATININE 0.86 0.84 0.88  CALCIUM 9.3 9.0  --   MG  --  1.8  --   PHOS  --  4.4  --   PROT 7.0 6.3  --   ALBUMIN 3.0* 2.6*  --   AST 64* 48*  --   ALT 111* 98*  --   ALKPHOS 156* 168*  --   BILITOT 0.4 0.4  --    Estimated Creatinine Clearance: 70 ml/min (by C-G formula based on Cr of 0.88).    Recent Labs  10/03/13 1645 10/03/13 2002 10/04/13 0734  GLUCAP 111* 112* 126*    Medications:  Scheduled:  . enoxaparin (LOVENOX) injection  40 mg Subcutaneous Q24H  . feeding supplement (ENSURE COMPLETE)  237 mL Oral BID BM  . metoCLOPramide (REGLAN) injection  5 mg Intravenous 3 times per day  . metoprolol tartrate  50 mg Oral BID  . multivitamin with minerals  1 tablet Oral Q24H  . pantoprazole  40 mg Oral BID  . traMADol  50 mg Oral 4 times per day   Infusions:  . sodium chloride 35 mL/hr at 10/04/13 0717  . Marland KitchenTPN (CLINIMIX-E) Adult 20 mL/hr at 10/03/13 1747   And  . fat emulsion 250 mL (10/03/13 1746)  . feeding supplement (OSMOLITE 1.2 CAL) 1,000 mL  (10/03/13 1726)   Assessment: 48 yo female with newly diagnosed poorly differentiated gastric adenocarcinoma, s/p distal gastrectomy with feeding gastrojejunostomy placement 7/31.  TNA was started on 7/30 for nutritional support until able to use enteral / oral routes.  Nutritional Goals:   Per RD assessment, 1800-2000 kCal,  85-95 grams of protein per day.    Osmolite 1.2 at goal rate of 65 mL/hr would provide 86g protein and 1872 kcal/day  Clinimix 5/15 at goal rate of 80 ml/hr + lipids 20% at 10 mL/hr would provide 96g protein and 1843 kcal/day    8/14:  D#16 TNA.   TNA Access: PICC (placed 7/30) POD #14    Current Nutrition:  Clinimix-E 5/15 at 20 mL/hr Lipids 20% at 5 mL/hr Osmolite 1.2 at 10 mL/hr via J-tube Full Liquids  IVF:  NS @ 35 ml/hr   Insulin Requirements in the past 24 hours:  1 unit Novolog; on sensitive-scale SSI TID   Labs (last serum chemistries checked 8/13):  Lytes: Na low but improved. All other lytes WNL.  Renal: SCr WNL, stable  Hepatic: Alk phos remains elevated  but <2x ULN.  Transaminases improving. Bilirubin remains WNL.  CBGs: at goal <150   TG: remains WNL (last checked 8/10)  Prealb: improving on last check.  Trend: 12.7 (7/31), 9.3 (8/3), 8.8 (8/4), 14.5 (8/10)  Tolerating Osmolite 1.2 at fixed low rate and trying full liquid diet.  Per surgery, hoping patient will tolerate PO feedings and not need continued tube feeding. Orders received from surgery to stop TPN and lipids after current bag (tonight at 1800).  Plan:   Stop TPN and lipids at 1800 tonight (after current bag).  Change multivitamins and trace elements (minerals) to PO route.  DC TPN labs, CBG monitoring, sliding scale insulin coverage.  Pharmacy will sign off; please reconsult if further input is needed.  Thank you,  Clayburn Pert, PharmD, BCPS Pager: [phone] 10/04/2013  8:52 AM
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
## 2013-10-04 NOTE — Progress Notes (Signed)
PT Cancellation Note  _X_Treatment cancelled today due to medical issues with patient which prohibited therapy.......... Pt not feeling well and had just vomited.  ___ Treatment cancelled today due to patient receiving procedure or test   ___ Treatment cancelled today due to patient's refusal to participate   ___ Treatment cancelled today due to  Rica Koyanagi  PTA [facility]  Acute  Rehab Pager      [phone]

## 2013-10-04 NOTE — Progress Notes (Signed)
Pt was lying in bed when I arrived. She said she was tired. Had brief visit w/prayer. She was very Patent attorney. Ernest Haber Chaplain  10/04/13 1500  Clinical Encounter Type  Visited With Patient

## 2013-10-04 NOTE — Progress Notes (Addendum)
Patient ID: Paula [name], female   DOB: 17-Sep-1965, 48 y.o.   MRN: [id number] 14 Days Post-Op  Subjective: Pt feels ok today.  Got a little full last night, but no increase in nausea.  Objective: Vital signs in last 24 hours: Temp:  [97.9 F (36.6 C)-98.5 F (36.9 C)] 97.9 F (36.6 C) (08/14 0512) Pulse Rate:  [87-100] 100 (08/14 0512) Resp:  [16-18] 16 (08/14 0512) BP: (100-131)/(60-76) 122/74 mmHg (08/14 0512) SpO2:  [100 %] 100 % (08/14 0512) Last BM Date: 10/03/13  Intake/Output from previous day: 08/13 0701 - 08/14 0700 In: -  Out: 1100 [Urine:1100] Intake/Output this shift:    PE: Abd: soft, GJ tube in place with 10cc/hr of TF present.  Wound is healing well  Lab Results:  No results found for this basename: WBC, HGB, HCT, PLT,  in the last 72 hours BMET  Recent Labs  10/02/13 0415 10/03/13 0500 10/04/13 0530  NA 135* 136*  --   K 4.3 4.5  --   CL 97 98  --   CO2 27 29  --   GLUCOSE 114* 135*  --   BUN 15 14  --   CREATININE 0.86 0.84 0.88  CALCIUM 9.3 9.0  --    PT/INR No results found for this basename: LABPROT, INR,  in the last 72 hours CMP     Component Value Date/Time   NA 136* 10/03/2013 0500   K 4.5 10/03/2013 0500   CL 98 10/03/2013 0500   CO2 29 10/03/2013 0500   GLUCOSE 135* 10/03/2013 0500   BUN 14 10/03/2013 0500   CREATININE 0.88 10/04/2013 0530   CALCIUM 9.0 10/03/2013 0500   PROT 6.3 10/03/2013 0500   ALBUMIN 2.6* 10/03/2013 0500   AST 48* 10/03/2013 0500   ALT 98* 10/03/2013 0500   ALKPHOS 168* 10/03/2013 0500   BILITOT 0.4 10/03/2013 0500   GFRNONAA 78* 10/04/2013 0530   GFRAA 90* 10/04/2013 0530   Lipase     Component Value Date/Time   LIPASE 26 09/14/2013 2103    Studies/Results: Dg Ugi W/water Sol Cm  10/02/2013   CLINICAL DATA:  Status post Billroth 2 for gastric carcinoma 2 weeks ago. Persistent nausea and vomiting.  EXAM: WATER SOLUBLE UPPER GI SERIES  TECHNIQUE: Single-column upper GI series was performed using water  soluble contrast. Preprocedure scout film was also performed.  CONTRAST:  149mL OMNIPAQUE IOHEXOL 300 MG/ML  SOLN  COMPARISON:  09/26/2013 acute abdomen series.  CT of 09/13/2013.  FLUOROSCOPY TIME:  2 min and 45 seconds  FINDINGS: Preprocedure scout film demonstrates a left-sided gastrojejunostomy tube. No free intraperitoneal air. Non-obstructive bowel gas pattern.  Focused single-contrast exam, with the patient nearly upright (unable to be positioned supine secondary to discomfort). Normal postoperative appearance of the residual stomach. Contrast traverses the gastrojejunostomy site, without extravasation or obstruction. Normal caliber of proximal small bowel loops.  IMPRESSION: Expected appearance after gastrojejunostomy, without perforation or bowel obstruction.   Electronically Signed   By: Abigail Miyamoto M.D.   On: 10/02/2013 11:46    Anti-infectives: Anti-infectives   Start     Dose/Rate Route Frequency Ordered Stop   09/20/13 1915  cefOXitin (MEFOXIN) 1 g in dextrose 5 % 50 mL IVPB     1 g 100 mL/hr over 30 Minutes Intravenous Every 6 hours 09/20/13 1903 09/21/13 0703   09/20/13 0600  [MAR Hold]  cefOXitin (MEFOXIN) 2 g in dextrose 5 % 50 mL IVPB     (On MAR  Hold since 09/20/13 1215)  Comments:  Pharmacy may adjust dosing strength, interval, or rate of medication as needed for optimal therapy for the patient Send with patient on call to the OR.  Anesthesia to complete antibiotic administration <81min prior to incision per [facility].   2 g 100 mL/hr over 30 Minutes Intravenous On call to O.R. 09/19/13 1119 09/20/13 1315     Assessment/Plan 1. POD 14, Gastric Carcinoma; S/p Diagnostic laparoscopy, distal gastrectomy with billroth II anastamosis and feeding gastrojejunostomy tube, [name], MD - 09/20/13.   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 Hypertension/tachycardia  3. Hx of  migraine/resolving subdural hematoma  4. Back pain  5. Incontinence  6. PCM, TNA  7. Anemia  8. Ongoing post op nausea and vomiting  9. Pain control  Plan: 1. Will continue on full liquids today and as well as TF at 10cc/hr and see if she can continue to tolerate this well.  Will then make a decision, maybe tomorrow, about whether to keep her on fulls for a week and then advance on her own or to go ahead and advance her to solid food tomorrow. (hopefully, she will not need to go home with TFs if she is able to take an oral diet) 2. Pain control d/w primary team.  Will try oxycodone 5-15mg  q 4hrs prn pain with prn tylenol.  I have decreased her dilaudid to 0.5-1mg  q 3 hrs for breakthrough pain only.  She understands and is agreeable to give this a try. 3. Mobilize 4. Dc TNA today 5. Add Ensure for additional caloric support.  LOS: 20 days   OSBORNE,KELLY E 10/04/2013, 8:28 AM Pager: [phone]  Agree with above. Primary issue to get all her nutrition enterally.  Will be off TPN today and hope to stop IVF tomorrow.  Then see about going home. For now on significant narcotics. Will have to deal with pain issues later.  Alphonsa Overall, MD, [facility] Surgery Pager: [phone] Office phone:  [phone]

## 2013-10-05 MED ORDER — HYDROMORPHONE HCL PF 1 MG/ML IJ SOLN
1.0000 mg | INTRAMUSCULAR | Status: DC | PRN
  Administered 2013-10-05: 2 mg via INTRAVENOUS
  Administered 2013-10-05: 1 mg via INTRAVENOUS
  Administered 2013-10-05 – 2013-10-07 (×8): 2 mg via INTRAVENOUS
  Filled 2013-10-05 (×6): qty 2
  Filled 2013-10-05: qty 1
  Filled 2013-10-05 (×3): qty 2

## 2013-10-05 MED ORDER — PANTOPRAZOLE SODIUM 40 MG IV SOLR
40.0000 mg | Freq: Two times a day (BID) | INTRAVENOUS | Status: DC
  Administered 2013-10-05 – 2013-10-07 (×5): 40 mg via INTRAVENOUS
  Filled 2013-10-05 (×6): qty 40
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
## 2013-10-05 NOTE — Progress Notes (Signed)
Patient ID: Paula [name], female   DOB: 01-14-1966, 48 y.o.   MRN: [id number]  [facility] Progress Note:   15 Days Post-Op  Subjective: Mental status is clear.  Not feeling well after night of nausea and vomitingr Objective: Vital signs in last 24 hours: Temp:  [97.6 F (36.4 C)-99 F (37.2 C)] 97.6 F (36.4 C) (08/15 1448) Pulse Rate:  [99-113] 99 (08/15 1448) Resp:  [14-16] 16 (08/15 1448) BP: (113-143)/(68-86) 113/69 mmHg (08/15 1448) SpO2:  [100 %] 100 % (08/15 1448)  Intake/Output from previous day:   Intake/Output this shift: Total I/O In: 60 [P.O.:60] Out: 400 [Urine:400]  Physical Exam: Work of breathing is not labored.  G tube to suction.    Lab Results:  Results for orders placed during the hospital encounter of 09/14/13 (from the past 48 hour(s))  GLUCOSE, CAPILLARY     Status: Abnormal   Collection Time    10/03/13  4:45 PM      Result Value Ref Range   Glucose-Capillary 111 (*) 70 - 99 mg/dL   Comment 1 Documented in Chart     Comment 2 Notify RN    GLUCOSE, CAPILLARY     Status: Abnormal   Collection Time    10/03/13  8:02 PM      Result Value Ref Range   Glucose-Capillary 112 (*) 70 - 99 mg/dL   Comment 1 Notify RN    CREATININE, SERUM     Status: Abnormal   Collection Time    10/04/13  5:30 AM      Result Value Ref Range   Creatinine, Ser 0.88  0.50 - 1.10 mg/dL   GFR calc non Af Amer 78 (*) >90 mL/min   GFR calc Af Amer 90 (*) >90 mL/min   Comment: (NOTE)     The eGFR has been calculated using the CKD EPI equation.     This calculation has not been validated in all clinical situations.     eGFR's persistently <90 mL/min signify possible Chronic Kidney     Disease.  GLUCOSE, CAPILLARY     Status: Abnormal   Collection Time    10/04/13  7:34 AM      Result Value Ref Range   Glucose-Capillary 126 (*) 70 - 99 mg/dL   Comment 1 Documented in Chart     Comment 2 Notify RN      Radiology/Results: No results  found.  Anti-infectives: Anti-infectives   Start     Dose/Rate Route Frequency Ordered Stop   09/20/13 1915  cefOXitin (MEFOXIN) 1 g in dextrose 5 % 50 mL IVPB     1 g 100 mL/hr over 30 Minutes Intravenous Every 6 hours 09/20/13 1903 09/21/13 0703   09/20/13 0600  [MAR Hold]  cefOXitin (MEFOXIN) 2 g in dextrose 5 % 50 mL IVPB     (On MAR Hold since 09/20/13 1215)  Comments:  Pharmacy may adjust dosing strength, interval, or rate of medication as needed for optimal therapy for the patient Send with patient on call to the OR.  Anesthesia to complete antibiotic administration <53mn [facility]   2 g 100 mL/hr over 30 Minutes Intravenous On call to O.R. 09/19/13 1119 09/20/13 1315      Assessment/Plan: Problem List: Patient Active Problem List   Diagnosis Date Noted  . Malnutrition of moderate degree 09/19/2013  . 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 Antral ulcer 09/16/2013  . Migraine  headache 09/14/2013  . Intractable vomiting 09/14/2013  . PTSD (post-traumatic stress disorder) 08/19/2013  . Left knee injury 07/30/2013  . Low back pain 07/30/2013  . Mitral regurgitation 02/15/2013  . Chest pain 01/23/2012  . Essential hypertension 01/23/2012  . Murmur 01/23/2012    See how she does with clear liquids.  There does not appear to be a mechanical reason for this outlet obstruction. 15 Days Post-Op    LOS: 21 days   Matt B. Hassell Done, MD, [facility] [phone] beeper [phone]  10/05/2013 3:39 PM

## 2013-10-05 NOTE — Progress Notes (Signed)
OT Cancellation Note  Patient Details Name: Paula [name] MRN: [id number] DOB: 12-Jul-1965   Cancelled Treatment:    Reason Eval/Treat Not Completed: Patient declined, had N/V last night, c/o stomach pain  Chrishana Spargur A 10/05/2013, 12:54 PM

## 2013-10-06 NOTE — Progress Notes (Signed)
Patient ID: Paula [name], female   DOB: Oct 28, 1965, 48 y.o.   MRN: [id number] [facility] Surgery Progress Note:   16 Days Post-Op  Subjective: Mental status is clear.  Feeling better than yesterday.   Objective: Vital signs in last 24 hours: Temp:  [97.6 F (36.4 C)-98.3 F (36.8 C)] 98.1 F (36.7 C) (08/16 0517) Pulse Rate:  [88-99] 97 (08/16 0517) Resp:  [16] 16 (08/16 0517) BP: (105-119)/(61-69) 119/61 mmHg (08/16 0517) SpO2:  [99 %-100 %] 99 % (08/16 0517)  Intake/Output from previous day: 08/15 0701 - 08/16 0700 In: 2248.6 [P.O.:120; I.V.:2128.6] Out: 750 [Urine:750] Intake/Output this shift:    Physical Exam: Work of breathing is normal.  Less nauseated today.  She would like to expand diet and possibly go home tomorrow.    Lab Results:  No results found for this or any previous visit (from the past 48 hour(s)).  Radiology/Results: No results found.  Anti-infectives: Anti-infectives   Start     Dose/Rate Route Frequency Ordered Stop   09/20/13 1915  cefOXitin (MEFOXIN) 1 g in dextrose 5 % 50 mL IVPB     1 g 100 mL/hr over 30 Minutes Intravenous Every 6 hours 09/20/13 1903 09/21/13 0703   09/20/13 0600  [MAR Hold]  cefOXitin (MEFOXIN) 2 g in dextrose 5 % 50 mL IVPB     (On MAR Hold since 09/20/13 1215)  Comments:  Pharmacy may adjust dosing strength, interval, or rate of medication as needed for optimal therapy for the patient Send with patient on call to the OR.  Anesthesia to complete antibiotic administration <68min prior to incision per [facility].   2 g 100 mL/hr over 30 Minutes Intravenous On call to O.R. 09/19/13 1119 09/20/13 1315      Assessment/Plan: Problem List: Patient Active Problem List   Diagnosis Date Noted  . Malnutrition of moderate degree 09/19/2013  . 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 Antral ulcer 09/16/2013  . Migraine headache 09/14/2013  . Intractable vomiting 09/14/2013   . PTSD (post-traumatic stress disorder) 08/19/2013  . Left knee injury 07/30/2013  . Low back pain 07/30/2013  . Mitral regurgitation 02/15/2013  . Chest pain 01/23/2012  . Essential hypertension 01/23/2012  . Murmur 01/23/2012    Hopeful discharge tomorrow.  16 Days Post-Op    LOS: 22 days   Matt B. Hassell Done, MD, [facility] [phone] beeper [phone]  10/06/2013 8:09 AM
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
## 2013-10-06 NOTE — Progress Notes (Signed)
The patient requested I unhook her from the suction connected to her G-tube. She stated there was not much drainage, and wanted to be unhooked. I instructed patient to let me know if she felt any nausea or began vomiting, and then I would need to reconnect her. She was agreeable to this plan.

## 2013-10-07 LAB — PREALBUMIN: Prealbumin: 15.3 mg/dL — ABNORMAL LOW (ref 17.0–34.0)

## 2013-10-07 MED ORDER — PANTOPRAZOLE SODIUM 40 MG PO TBEC
40.0000 mg | DELAYED_RELEASE_TABLET | Freq: Two times a day (BID) | ORAL | Status: DC
  Administered 2013-10-07 – 2013-10-09 (×5): 40 mg via ORAL
  Filled 2013-10-07 (×10): qty 1

## 2013-10-07 MED ORDER — HYDROMORPHONE HCL PF 1 MG/ML IJ SOLN
1.0000 mg | INTRAMUSCULAR | Status: DC | PRN
  Administered 2013-10-07 – 2013-10-09 (×9): 1 mg via INTRAVENOUS
  Filled 2013-10-07 (×9): qty 1

## 2013-10-07 MED ORDER — ENSURE COMPLETE PO LIQD
237.0000 mL | Freq: Three times a day (TID) | ORAL | Status: DC
  Administered 2013-10-07 – 2013-10-08 (×2): 237 mL via ORAL
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
## 2013-10-07 NOTE — Progress Notes (Signed)
[place] is providing the following services: Wheelchair and commode  If patient discharges after hours, please call [phone].   Linward Headland 10/07/2013, 12:42 PM

## 2013-10-07 NOTE — Progress Notes (Signed)
17 Days Post-Op  Subjective: Still on clear liquid diet and tolerating it.  Has not had tube feeds since Saturday.  Objective: Vital signs in last 24 hours: Temp:  [98 F (36.7 C)-98.2 F (36.8 C)] 98.2 F (36.8 C) (08/17 0530) Pulse Rate:  [88-99] 91 (08/17 0530) Resp:  [16-18] 18 (08/17 0530) BP: (102-121)/(61-69) 102/61 mmHg (08/17 0530) SpO2:  [99 %-100 %] 100 % (08/17 0530) Last BM Date: 10/06/13  Intake/Output from previous day: 08/16 0701 - 08/17 0700 In: 180 [P.O.:180] Out: 1000 [Urine:1000] Intake/Output this shift:    PE: General- In NAD Abdomen-soft, incision clean and intact, G-J tube in LUQ  Lab Results:  No results found for this basename: WBC, HGB, HCT, PLT,  in the last 72 hours BMET No results found for this basename: NA, K, CL, CO2, GLUCOSE, BUN, CREATININE, CALCIUM,  in the last 72 hours PT/INR No results found for this basename: LABPROT, INR,  in the last 72 hours Comprehensive Metabolic Panel:    Component Value Date/Time   NA 136* 10/03/2013 0500   NA 135* 10/02/2013 0415   K 4.5 10/03/2013 0500   K 4.3 10/02/2013 0415   CL 98 10/03/2013 0500   CL 97 10/02/2013 0415   CO2 29 10/03/2013 0500   CO2 27 10/02/2013 0415   BUN 14 10/03/2013 0500   BUN 15 10/02/2013 0415   CREATININE 0.88 10/04/2013 0530   CREATININE 0.84 10/03/2013 0500   GLUCOSE 135* 10/03/2013 0500   GLUCOSE 114* 10/02/2013 0415   CALCIUM 9.0 10/03/2013 0500   CALCIUM 9.3 10/02/2013 0415   AST 48* 10/03/2013 0500   AST 64* 10/02/2013 0415   ALT 98* 10/03/2013 0500   ALT 111* 10/02/2013 0415   ALKPHOS 168* 10/03/2013 0500   ALKPHOS 156* 10/02/2013 0415   BILITOT 0.4 10/03/2013 0500   BILITOT 0.4 10/02/2013 0415   PROT 6.3 10/03/2013 0500   PROT 7.0 10/02/2013 0415   ALBUMIN 2.6* 10/03/2013 0500   ALBUMIN 3.0* 10/02/2013 0415     Studies/Results: No results found.  Anti-infectives: Anti-infectives   Start     Dose/Rate Route Frequency Ordered Stop   09/20/13 1915  cefOXitin (MEFOXIN) 1 g  in dextrose 5 % 50 mL IVPB     1 g 100 mL/hr over 30 Minutes Intravenous Every 6 hours 09/20/13 1903 09/21/13 0703   09/20/13 0600  [MAR Hold]  cefOXitin (MEFOXIN) 2 g in dextrose 5 % 50 mL IVPB     (On MAR Hold since 09/20/13 1215)  Comments:  Pharmacy may adjust dosing strength, interval, or rate of medication as needed for optimal therapy for the patient Send with patient on call to the OR.  Anesthesia to complete antibiotic administration <80min prior to incision per [facility].   2 g 100 mL/hr over 30 Minutes Intravenous On call to O.R. 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    LOS: 23 days   Plan: Advance to full liquids and Ensure.  If this is tolerated, she may be discharged.  Follow up with Dr. Barry Dienes in 2 weeks.  She has requested a bedside commode and wheelchair.   Krishika Bugge J 10/07/2013

## 2013-10-07 NOTE — Progress Notes (Addendum)
Occupational Therapy Treatment Patient Details Name: Paula [name] MRN: [id number] DOB: Jul 03, 1965 Today's Date: 10/07/2013    History of present illness 48 yo female adm  09/14/13 with Gastric carcinoma, s/p Distal gastrectomy 09/20/13; PMHx: costochondritis;         -- MRI shows resolving SDH; CT neg for PE, small infiltrate positive for R sided pna   OT comments  Pt up the the bathroom to perform sponge bath and dress. She does fatigue after routine and needs rest breaks to complete tasks safely. Discussed energy conservation strategies and a reacher to make tasks safer and easier. Revised goals-see updated goals section. Pt reported some soreness at feeding tube site but didn't rate level.   Follow Up Recommendations  Home health OT;Supervision/Assistance - 24 hour    Equipment Recommendations  3 in 1 bedside comode    Recommendations for Other Services      Precautions / Restrictions Precautions Precautions: Fall Precaution Comments: JP drain/liquid diet Restrictions Weight Bearing Restrictions: No       Mobility Bed Mobility         Supine to sit: Modified independent (Device/Increase time);HOB elevated        Transfers Overall transfer level: Needs assistance   Transfers: Sit to/from Stand Sit to Stand: Modified independent (Device/Increase time)              Balance                                   ADL       Grooming: Wash/dry face;Supervision/safety;Standing                   Toilet Transfer: Min guard;Ambulation Toilet Transfer Details (indicate cue type and reason): pushed IV pole           General ADL Comments: Pt performed sponge bath and dress (mesh underwear and socks) part of the time in standing and part of the time sitting on 3in1 at sink. Overall at supervision level and cues for safety. Discussed energy conservation and pacing herself as she felt fatigued after performing sponge bath and dress  routine and managing own towels, and placing items on shelves etc after bath. She attempted to squat and pick up an object and reported feeling dizzy needing min guard assist to stand and regain standing balance. Assisted back to recliner with pt pushing iv pole with min guard assist and pt reporting fatigue. Discused further using reacher to help retrieve objects to avoid the bending and stooping over right now for safety and also breaking all tasks into multiple small subtasks with rest breaks as needed. Pt verbalized understanding. Pt able to cross LEs up to wash legs seated on commode chair.       Vision                     Perception     Praxis      Cognition   Behavior During Therapy: [facility] for tasks assessed/performed Overall Cognitive Status: Within Functional Limits for tasks assessed                       Extremity/Trunk Assessment               Exercises     Shoulder Instructions       General Comments      Pertinent Vitals/ Pain  Home Living                                          Prior Functioning/Environment              Frequency Min 2X/week     Progress Toward Goals  OT Goals(current goals can now be found in the care plan section)  Progress towards OT goals:  (updated goals)  Acute Rehab OT Goals Patient Stated Goal: to be independent and do for herself.  OT Goal Formulation: With patient Time For Goal Achievement: 10/21/13 Potential to Achieve Goals: Good  Plan Discharge plan remains appropriate    Co-evaluation                 End of Session     Activity Tolerance Patient limited by fatigue   Patient Left in chair;with call bell/phone within reach   Nurse Communication          Time: 6144-3154 OT Time Calculation (min): 45 min  Charges: OT General Charges $OT Visit: 1 Procedure OT Treatments $Self Care/Home Management : 23-37 mins $Therapeutic Activity: 8-22  mins  Jules Schick [phone] 10/07/2013, 11:36 AM

## 2013-10-07 NOTE — Progress Notes (Signed)
Physical Therapy Treatment Patient Details Name: Paula [name] MRN: [id number] DOB: Mar 01, 1965 Today's Date: 10/07/2013    History of Present Illness 48 yo female adm  09/14/13 with Gastric carcinoma, s/p Distal gastrectomy 09/20/13; PMHx: costochondritis;         -- MRI shows resolving SDH; CT neg for PE, small infiltrate positive for R sided pna    PT Comments    Pt c/o mild nausea and increased ABD pain after amb.  Pt tolerated amb full unit.  C/O forward flex posture is increasing her back pain.    Follow Up Recommendations  Home health PT     Equipment Recommendations  Rolling walker with 5" wheels    Recommendations for Other Services       Precautions / Restrictions Precautions Precautions: Fall    Mobility  Bed Mobility Overal bed mobility: Modified Independent             General bed mobility comments: increased time  Transfers Overall transfer level: Modified independent Equipment used: None Transfers: Sit to/from Stand Sit to Stand: Modified independent (Device/Increase time)         General transfer comment: Increased time. Good safety cognition.   Ambulation/Gait Ambulation/Gait assistance: Min guard Ambulation Distance (Feet): 500 Feet Assistive device: Rolling walker (2 wheeled) Gait Pattern/deviations: Step-through pattern;Decreased stride length Gait velocity: decreased   General Gait Details: close guard for safety. 2 brief standing rest breaks taken.    Stairs            Wheelchair Mobility    Modified Rankin (Stroke Patients Only)       Balance                                    Cognition                            Exercises      General Comments        Pertinent Vitals/Pain      Home Living                      Prior Function            PT Goals (current goals can now be found in the care plan section) Progress towards PT goals: Progressing toward goals     Frequency  Min 3X/week    PT Plan      Co-evaluation             End of Session Equipment Utilized During Treatment: Gait belt Activity Tolerance: Patient tolerated treatment well Patient left: in bed;with call bell/phone within reach     Time: 1539-1610 PT Time Calculation (min): 31 min  Charges:  $Gait Training: 8-22 mins $Therapeutic Activity: 8-22 mins                    G Codes:      Rica Koyanagi  PTA [facility]  Acute  Rehab Pager      [phone]

## 2013-10-08 MED ORDER — ENSURE COMPLETE PO LIQD: 237.0000 mL | Freq: Three times a day (TID) | ORAL | Status: DC | PRN

## 2013-10-08 MED ORDER — UNJURY CHOCOLATE CLASSIC POWDER
1.0000 | Freq: Two times a day (BID) | ORAL | Status: DC
  Administered 2013-10-08 – 2013-10-09 (×2): 1 via ORAL
  Filled 2013-10-08 (×11): qty 27

## 2013-10-08 NOTE — Progress Notes (Signed)
Patient ID: Paula [name], female   DOB: [birth year], 48 y.o.   MRN: [id number] 18 Days Post-Op  Subjective: Patient had some nausea overnight. Small amount of emesis, but feels okay today. As wanted to know if she can still go home.  Objective: Vital signs in last 24 hours: Temp:  [97.5 F (36.4 C)-98.1 F (36.7 C)] 97.9 F (36.6 C) (08/18 0547) Pulse Rate:  [83-99] 99 (08/18 0547) Resp:  [18] 18 (08/18 0547) BP: (117-124)/(69-83) 124/69 mmHg (08/18 0547) SpO2:  [100 %] 100 % (08/18 0547) Last BM Date: 10/06/13  Intake/Output from previous day: 08/17 0701 - 08/18 0700 In: 960 [P.O.:960] Out: -  Intake/Output this shift:    PE: Abd: Soft, appropriately tender, nondistended, incisions are well-healed, GJ tube is in place.  Lab Results:  No results found for this basename: WBC, HGB, HCT, PLT,  in the last 72 hours BMET No results found for this basename: NA, K, CL, CO2, GLUCOSE, BUN, CREATININE, CALCIUM,  in the last 72 hours PT/INR No results found for this basename: LABPROT, INR,  in the last 72 hours CMP     Component Value Date/Time   NA 136* 10/03/2013 0500   K 4.5 10/03/2013 0500   CL 98 10/03/2013 0500   CO2 29 10/03/2013 0500   GLUCOSE 135* 10/03/2013 0500   BUN 14 10/03/2013 0500   CREATININE 0.88 10/04/2013 0530   CALCIUM 9.0 10/03/2013 0500   PROT 6.3 10/03/2013 0500   ALBUMIN 2.6* 10/03/2013 0500   AST 48* 10/03/2013 0500   ALT 98* 10/03/2013 0500   ALKPHOS 168* 10/03/2013 0500   BILITOT 0.4 10/03/2013 0500   GFRNONAA 78* 10/04/2013 0530   GFRAA 90* 10/04/2013 0530   Lipase     Component Value Date/Time   LIPASE 26 09/14/2013 2103       Studies/Results: No results found.  Anti-infectives: Anti-infectives   Start     Dose/Rate Route Frequency Ordered Stop   09/20/13 1915  cefOXitin (MEFOXIN) 1 g in dextrose 5 % 50 mL IVPB     1 g 100 mL/hr over 30 Minutes Intravenous Every 6 hours 09/20/13 1903 09/21/13 0703   09/20/13 0600  [MAR Hold]  cefOXitin  (MEFOXIN) 2 g in dextrose 5 % 50 mL IVPB     (On MAR Hold since 09/20/13 1215)  Comments:  Pharmacy may adjust dosing strength, interval, or rate of medication as needed for optimal therapy for the patient Send with patient on call to the OR.  Anesthesia to complete antibiotic administration <51min prior to incision per [facility].   2 g 100 mL/hr over 30 Minutes Intravenous On call to O.R. 09/19/13 1119 09/20/13 1315       Assessment/Plan   1. POD 17, Gastric Carcinoma; S/p Diagnostic laparoscopy, distal gastrectomy with billroth II anastamosis and feeding gastrojejunostomy tube, [name], MD - 09/20/13.  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 Hypertension/tachycardia  3. Hx of migraine/resolving subdural hematoma  4. Back pain  5. Incontinence  6. PCM, TNA  7. Anemia  8. Ongoing post op nausea and vomiting  9. Pain control  Plan: 1. Patient is off of tube feeds and TNA. She is trying to maintain her nutrition orally. She is tolerating her diet occasionally feels full. She fills that her biggest issue is that her stomach feels full but her mind keeps telling her to eat.  This is  what makes her have some emesis sometimes. We discussed what she goes home, she needs to have about 5 small meals a day. This will hopefully help her not eat big boluses with food at one time. We will have a dietitian see her today to discuss this as well as a clear/full/dysphasia 1-type diet with the patient. She struggled with nausea and emesis prior to surgery. She will likely continue to struggle with this especially as long as she is taking as much narcotics as she is. The patient would still like to go home today. I think if she can control her cravings and truly stop eating when her stomach tells her she is full, she will probably do okay at home. She should followup with Dr. Barry Dienes in the office in 2-3 weeks.  LOS:  24 days    Aviela Blundell E 10/08/2013, 7:53 AM Pager: [phone]

## 2013-10-08 NOTE — Progress Notes (Signed)
Pt was sitting up when I arrived. She told she would not get to go home today; perhaps tomorrow. Pt informed me her brother will be funeralized today at 12:30. As part of her process of grieving she showed me pictures that were sent to her via txt of his remains. We talked a little about family. Although she could not be there she had sent flowers and seemed to have peace in all that she could do. Had prayer w/pt after lunch tray arrived. She encouraged future visits.  Ernest Haber Chaplain  10/08/13 1200  Clinical Encounter Type  Visited With Patient

## 2013-10-08 NOTE — Progress Notes (Signed)
Pt. Called RN into room. Pt. Stated she was hurting and felt as if she would vomit.  Pt. Requested pain and nausea medication.  When RN returned to room pt. Began vomiting green vomit. In total pt. Vomited 150cc's total. Pt. Was given pain and nausea medication.  Will continue to monitor.

## 2013-10-08 NOTE — Progress Notes (Signed)
Patient seen and examined.  Still having problems with nausea and some vomiting.  Will give her one more day.  Option after that would be discharge with cyclic TPN at night until her oral intake is adequate.
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
## 2013-10-09 ENCOUNTER — Inpatient Hospital Stay (HOSPITAL_COMMUNITY): Payer: No Typology Code available for payment source

## 2013-10-09 LAB — TROPONIN I: Troponin I: <0.3 ng/mL (ref <0.30)

## 2013-10-09 MED ORDER — HYDROMORPHONE HCL PF 1 MG/ML IJ SOLN
1.0000 mg | Freq: Once | INTRAMUSCULAR | Status: AC
  Administered 2013-10-09: 1 mg via INTRAVENOUS
  Filled 2013-10-09: qty 1

## 2013-10-09 MED ORDER — MORPHINE SULFATE 2 MG/ML IJ SOLN
2.0000 mg | Freq: Once | INTRAMUSCULAR | Status: AC
  Administered 2013-10-09: 2 mg via INTRAVENOUS
  Filled 2013-10-09: qty 1

## 2013-10-09 MED ORDER — METOCLOPRAMIDE HCL 10 MG PO TABS
10.0000 mg | ORAL_TABLET | Freq: Three times a day (TID) | ORAL | Status: DC
  Administered 2013-10-09: 10 mg via ORAL
  Filled 2013-10-09 (×8): qty 1

## 2013-10-09 MED ORDER — FENTANYL 12 MCG/HR TD PT72
12.5000 ug | MEDICATED_PATCH | TRANSDERMAL | Status: DC
  Administered 2013-10-09: 12.5 ug via TRANSDERMAL
  Filled 2013-10-09: qty 1

## 2013-10-09 NOTE — Progress Notes (Signed)
N/V resolved after PRN Phenergan.

## 2013-10-09 NOTE — Progress Notes (Signed)
Aprox 250ml liq green emesis N/V this am. PRN Phenergan iv admin as ordered.

## 2013-10-09 NOTE — Progress Notes (Signed)
Occupational Therapy Treatment Patient Details Name: Paula [name] MRN: [id number] DOB: November 02, 1965 Today's Date: 10/09/2013    History of present illness 48 yo female adm  09/14/13 with Gastric carcinoma, s/p Distal gastrectomy 09/20/13; PMHx: costochondritis;         -- MRI shows resolving SDH; CT neg for PE, small infiltrate positive for R sided pna   OT comments  Focus of session today was instruction in energy conservation and pt self monitoring pain and fatigue during gathering of items for discharge home. Detailed energy conservation handout provided. Discussed importance of limiting visitors at home and using her support system particularly for IADL and errands.   Follow Up Recommendations  Home health OT;Supervision/Assistance - 24 hour    Equipment Recommendations  3 in 1 bedside comode    Recommendations for Other Services      Precautions / Restrictions         Mobility Bed Mobility Overal bed mobility: Modified Independent                Transfers Overall transfer level: Modified independent     Sit to Stand: Modified independent (Device/Increase time)              Balance                                   ADL Overall ADL's : Needs assistance/impaired     Grooming: Wash/dry hands;Supervision/safety;Standing                   Toilet Transfer: Supervision/safety;Ambulation Toilet Transfer Details (indicate cue type and reason): pushed IV pole Toileting- Clothing Manipulation and Hygiene: Modified independent;Sit to/from stand       Functional mobility during ADLs: Supervision/safety (pushing IV pole within her room) General ADL Comments: Pt gathered items around room in preparation for impending discharge today with supervision and use of IV pole for safety. Educated pt in energy conservation strategies including pacing, planning, sitting when possible and avoiding prolonged standing, asking family and friends for  assistance with housekeeping and errands. Gave pt energy conservation handout.      Vision                     Perception     Praxis      Cognition   Behavior During Therapy: WFL for tasks assessed/performed Overall Cognitive Status: Within Functional Limits for tasks assessed                       Extremity/Trunk Assessment               Exercises     Shoulder Instructions       General Comments      Pertinent Vitals/ Pain       Pain Assessment: 0-10 Pain Score:  (did not rate) Pain Location: back, lower abdoment Pain Descriptors / Indicators: Aching Pain Intervention(s): Repositioned;Premedicated before session  Home Living                                          Prior Functioning/Environment              Frequency Min 2X/week     Progress Toward Goals  OT Goals(current goals can now be found in the care plan  section)  Progress towards OT goals: Progressing toward goals  Acute Rehab OT Goals Patient Stated Goal: to be independent and do for herself.   Plan Discharge plan remains appropriate    Co-evaluation                 End of Session     Activity Tolerance Patient limited by fatigue   Patient Left in bed;with call bell/phone within reach   Nurse Communication          Time: 1300-1325 OT Time Calculation (min): 25 min  Charges: OT General Charges $OT Visit: 1 Procedure OT Treatments $Self Care/Home Management : 23-37 mins  Malka So 10/09/2013, 1:32 PM [phone]
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
## 2013-10-09 NOTE — Progress Notes (Signed)
Patient ID: Paula [name], female   DOB: 06-14-1965, 48 y.o.   MRN: [id number] 19 Days Post-Op  Subjective: Pt feels well today.  No further nausea or emesis yesterday.  Objective: Vital signs in last 24 hours: Temp:  [98.3 F (36.8 C)-98.4 F (36.9 C)] 98.3 F (36.8 C) (08/19 0606) Pulse Rate:  [84-96] 91 (08/19 0606) Resp:  [16-18] 16 (08/19 0606) BP: (101-114)/(58-67) 114/64 mmHg (08/19 0606) SpO2:  [99 %-100 %] 99 % (08/19 0606) Last BM Date: 10/06/13  Intake/Output from previous day: 08/18 0701 - 08/19 0700 In: 1391.7 [P.O.:960; I.V.:431.7] Out: 150 [Emesis/NG output:150] Intake/Output this shift:    PE: Abd: soft, appropriately tender, incision is well healed.  GJ tube in place.  Lab Results:  No results found for this basename: WBC, HGB, HCT, PLT,  in the last 72 hours BMET No results found for this basename: NA, K, CL, CO2, GLUCOSE, BUN, CREATININE, CALCIUM,  in the last 72 hours PT/INR No results found for this basename: LABPROT, INR,  in the last 72 hours CMP     Component Value Date/Time   NA 136* 10/03/2013 0500   K 4.5 10/03/2013 0500   CL 98 10/03/2013 0500   CO2 29 10/03/2013 0500   GLUCOSE 135* 10/03/2013 0500   BUN 14 10/03/2013 0500   CREATININE 0.88 10/04/2013 0530   CALCIUM 9.0 10/03/2013 0500   PROT 6.3 10/03/2013 0500   ALBUMIN 2.6* 10/03/2013 0500   AST 48* 10/03/2013 0500   ALT 98* 10/03/2013 0500   ALKPHOS 168* 10/03/2013 0500   BILITOT 0.4 10/03/2013 0500   GFRNONAA 78* 10/04/2013 0530   GFRAA 90* 10/04/2013 0530   Lipase     Component Value Date/Time   LIPASE 26 09/14/2013 2103       Studies/Results: No results found.  Anti-infectives: Anti-infectives   Start     Dose/Rate Route Frequency Ordered Stop   09/20/13 1915  cefOXitin (MEFOXIN) 1 g in dextrose 5 % 50 mL IVPB     1 g 100 mL/hr over 30 Minutes Intravenous Every 6 hours 09/20/13 1903 09/21/13 0703   09/20/13 0600  [MAR Hold]  cefOXitin (MEFOXIN) 2 g in dextrose 5 % 50 mL  IVPB     (On MAR Hold since 09/20/13 1215)  Comments:  Pharmacy may adjust dosing strength, interval, or rate of medication as needed for optimal therapy for the patient Send with patient on call to the OR.  Anesthesia to complete antibiotic administration <35min prior to incision per [facility].   2 g 100 mL/hr over 30 Minutes Intravenous On call to O.R. 09/19/13 1119 09/20/13 1315       Assessment/Plan  1. POD 18, Gastric Carcinoma; S/p Diagnostic laparoscopy, distal gastrectomy with billroth II anastamosis and feeding gastrojejunostomy tube, [name], MD - 09/20/13.  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 Hypertension/tachycardia  3. Hx of migraine/resolving subdural hematoma  4. Back pain  5. Incontinence  6. PCM, TNA  7. Anemia  8. Ongoing post op nausea and vomiting  9. Pain control   Plan: 1. Patient had no further emesis yesterday.  She is stable to go home today with no further assistance with feeding such as TNA or tube feeds.  She needs to follow up with Dr. Barry Dienes in 2 weeks after discharge.  All of her discharge instructions are in the DC section of Epic.  LOS: 25  days    Malaysia Crance E 10/09/2013, 8:09 AM Pager: [phone]

## 2013-10-09 NOTE — Progress Notes (Signed)
Patient seen and examined.  Agree with PA's note.  

## 2013-10-09 NOTE — Progress Notes (Signed)
PT Cancellation Note  Patient Details Name: Paula [name] MRN: [id number] DOB: 12/05/1965   Cancelled Treatment:    Reason Eval/Treat Not Completed: Fatigue/lethargy limiting ability to participate (recently medicated, also having nausea. C/O L knee pain from an injury.)   Claretha Cooper 10/09/2013, 4:09 PM

## 2013-10-09 NOTE — Plan of Care (Signed)
Problem: Phase III Progression Outcomes Goal: Activity at appropriate level-compared to baseline (UP IN CHAIR FOR HEMODIALYSIS)  Outcome: Progressing Patient requires assistance with ADL's, able to ambulate to the BR with standby assist.

## 2013-10-10 DIAGNOSIS — M545 Low back pain, unspecified: Secondary | ICD-10-CM

## 2013-10-10 DIAGNOSIS — M549 Dorsalgia, unspecified: Secondary | ICD-10-CM | POA: Diagnosis present

## 2013-10-10 MED ORDER — HYDROMORPHONE HCL PF 1 MG/ML IJ SOLN
1.0000 mg | Freq: Once | INTRAMUSCULAR | Status: AC
  Administered 2013-10-10: 1 mg via INTRAVENOUS
  Filled 2013-10-10: qty 1

## 2013-10-10 MED ORDER — BISACODYL 10 MG RE SUPP
10.0000 mg | Freq: Once | RECTAL | Status: AC
  Administered 2013-10-10: 10 mg via RECTAL
  Filled 2013-10-10: qty 1

## 2013-10-10 MED ORDER — OSMOLITE 1.5 CAL PO LIQD
1000.0000 mL | ORAL | Status: DC
  Administered 2013-10-10: 1000 mL
  Filled 2013-10-10: qty 1000

## 2013-10-10 MED ORDER — HYDROMORPHONE HCL PF 1 MG/ML IJ SOLN
0.5000 mg | Freq: Once | INTRAMUSCULAR | Status: AC
  Administered 2013-10-10: 1 mg via INTRAVENOUS
  Filled 2013-10-10: qty 1

## 2013-10-10 MED ORDER — KETOROLAC TROMETHAMINE 15 MG/ML IJ SOLN
INTRAMUSCULAR | Status: AC
  Filled 2013-10-10: qty 2

## 2013-10-10 MED ORDER — KETOROLAC TROMETHAMINE 30 MG/ML IJ SOLN
30.0000 mg | Freq: Four times a day (QID) | INTRAMUSCULAR | Status: DC | PRN
  Administered 2013-10-10 – 2013-10-11 (×2): 30 mg via INTRAVENOUS
  Filled 2013-10-10: qty 1

## 2013-10-10 MED ORDER — HYDROMORPHONE HCL 4 MG PO TABS
4.0000 mg | ORAL_TABLET | ORAL | Status: DC | PRN
  Administered 2013-10-10: 4 mg via ORAL
  Filled 2013-10-10 (×2): qty 1

## 2013-10-10 MED ORDER — LORAZEPAM 2 MG/ML IJ SOLN
0.5000 mg | Freq: Once | INTRAMUSCULAR | Status: AC
  Administered 2013-10-10: 0.5 mg via INTRAVENOUS
  Filled 2013-10-10: qty 1
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
## 2013-10-10 NOTE — Progress Notes (Signed)
Patient ID: Paula [name], female   DOB: 1965-11-05, 48 y.o.   MRN: [id number] 20 Days Post-Op  Subjective: Pt conts to have nausea and emesis.  Complains of severe back pain  Objective: Vital signs in last 24 hours: Temp:  [98.1 F (36.7 C)-98.7 F (37.1 C)] 98.1 F (36.7 C) (08/20 0620) Pulse Rate:  [98-109] 98 (08/20 0620) Resp:  [16-18] 16 (08/20 0620) BP: (106-122)/(68-77) 122/76 mmHg (08/20 0620) SpO2:  [96 %-100 %] 98 % (08/20 0620) Last BM Date: 10/06/13  Intake/Output from previous day: 08/19 0701 - 08/20 0700 In: 1960 [P.O.:480; I.V.:1480] Out: -  Intake/Output this shift:    PE: Abd: soft, GJ tube in place.  Incision healing well.  ND  Lab Results:  No results found for this basename: WBC, HGB, HCT, PLT,  in the last 72 hours BMET No results found for this basename: NA, K, CL, CO2, GLUCOSE, BUN, CREATININE, CALCIUM,  in the last 72 hours PT/INR No results found for this basename: LABPROT, INR,  in the last 72 hours CMP     Component Value Date/Time   NA 136* 10/03/2013 0500   K 4.5 10/03/2013 0500   CL 98 10/03/2013 0500   CO2 29 10/03/2013 0500   GLUCOSE 135* 10/03/2013 0500   BUN 14 10/03/2013 0500   CREATININE 0.88 10/04/2013 0530   CALCIUM 9.0 10/03/2013 0500   PROT 6.3 10/03/2013 0500   ALBUMIN 2.6* 10/03/2013 0500   AST 48* 10/03/2013 0500   ALT 98* 10/03/2013 0500   ALKPHOS 168* 10/03/2013 0500   BILITOT 0.4 10/03/2013 0500   GFRNONAA 78* 10/04/2013 0530   GFRAA 90* 10/04/2013 0530   Lipase     Component Value Date/Time   LIPASE 26 09/14/2013 2103       Studies/Results: Dg Abd 1 View  10/09/2013   CLINICAL DATA:  Fecal impaction.  EXAM: ABDOMEN - 1 VIEW  COMPARISON:  September 26, 2013.  FINDINGS: Residual contrast is noted throughout the colon. There is no evidence of bowel obstruction or ileus. Gastrostomy tube is noted in the left upper quadrant. Phleboliths are noted in the pelvis.  IMPRESSION: No evidence of bowel obstruction or ileus.  Residual contrast is noted throughout the colon.   Electronically Signed   By: Sabino Dick M.D.   On: 10/09/2013 15:24    Anti-infectives: Anti-infectives   Start     Dose/Rate Route Frequency Ordered Stop   09/20/13 1915  cefOXitin (MEFOXIN) 1 g in dextrose 5 % 50 mL IVPB     1 g 100 mL/hr over 30 Minutes Intravenous Every 6 hours 09/20/13 1903 09/21/13 0703   09/20/13 0600  [MAR Hold]  cefOXitin (MEFOXIN) 2 g in dextrose 5 % 50 mL IVPB     (On MAR Hold since 09/20/13 1215)  Comments:  Pharmacy may adjust dosing strength, interval, or rate of medication as needed for optimal therapy for the patient Send with patient on call to the OR.  Anesthesia to complete antibiotic administration <34min prior to incision per [facility].   2 g 100 mL/hr over 30 Minutes Intravenous On call to O.R. 09/19/13 1119 09/20/13 1315       Assessment/Plan 1. POD 19, Gastric Carcinoma; S/p Diagnostic laparoscopy, distal gastrectomy with billroth II anastamosis and feeding gastrojejunostomy tube, [name], MD - 09/20/13.  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 Hypertension/tachycardia  3. Hx of migraine/resolving  subdural hematoma  4. Back pain, severe, unknown etiology  5. Incontinence  6. PCM 7. Anemia  8. Ongoing post op nausea and vomiting  9. Pain control     Plan: 1. Given persistent nausea and emesis, we agree with NPO status.  We will start the patient on enteral tube feeds and see how she does.  If she can not tolerate this, then she will be transitioned back to TNA and try to get it cycled so she can go home. 2. After d/w Dr. Zella Richer, I have spoken with Dr. Rockne Menghini about my concern for metastatic lesion causing the patient's severe back pain.  Dr. Rockne Menghini has contacted Dr. [name] who has recommended a bone scan. 3. Will follow and make adjustments to nutritional support based off of how she responds.  I am  concerned about her symptoms given her very aggressive form of cancer.  LOS: 26 days    Treylen Gibbs E 10/10/2013, 1:31 PM Pager: [phone]
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
## 2013-10-10 NOTE — Progress Notes (Signed)
Patient was complaining of chest pressure. Stated it was a 8 out of 10 and that this has been something she has been dealing with on and off. NP notified and new orders given for a troponin, EKG and one time dose of morphine. EKG was done. It showed ST, HR 103. Vital signs normal. NP was notified about the results of the EKG. Morphine was given to patient and lab got blood work. Patient stated morphine does not help her, only Dilaudid. Will continue to monitor patient.

## 2013-10-10 NOTE — Progress Notes (Signed)
Occupational Therapy Treatment Patient Details Name: Paula [name] MRN: [id number] DOB: 1965-04-28 Today's Date: 10/10/2013    History of present illness 48 yo female adm  09/14/13 with Gastric carcinoma, s/p Distal gastrectomy 09/20/13; PMHx: costochondritis;         -- MRI shows resolving SDH; CT neg for PE, small infiltrate positive for R sided pna   OT comments  Pt overall functioning at supervision level with basic ADL. Educated and reinforced energy conservation and AE use for several sessions and pt states she has no further questions. She did vomit at the end of the session several times. Nursing made aware. HHOT to follow for home safety and reinforce energy conservation in her home environment.   Follow Up Recommendations  Supervision/Assistance - 24 hour;Home health OT    Equipment Recommendations  3 in 1 bedside comode    Recommendations for Other Services      Precautions / Restrictions Precautions Precaution Comments: liquid diet Restrictions Weight Bearing Restrictions: No       Mobility Bed Mobility               General bed mobility comments: OOB on the way to bathroom when OT arrived.  Transfers                      Balance                                   ADL                                         General ADL Comments: Pt up to the bathroom when OT arrived. Pt back out of the bathroom to the chair pushing IV pole with supervision. She became nauseous once seated in the chair and started to vomit. Nursing notified. Before nausea, discussed energy conservation ideas with pt and she was able to verbalize the need to use the reacher, ask for assistance with household tasks (IADL) and  listen to how her body feels. She is overall functioning at supervision level for basic ADL. Energy conservation handout reviewed again with pt and emphasized more the need to take rest breaks, sit when possible to perform  tasks, break up tasks into sub tasks. Pt agreeable that OT no longer needed and she has supervision at d/c. Will sign off.       Vision                     Perception     Praxis      Cognition   Behavior During Therapy: WFL for tasks assessed/performed Overall Cognitive Status: Within Functional Limits for tasks assessed                       Extremity/Trunk Assessment               Exercises     Shoulder Instructions       General Comments      Pertinent Vitals/ Pain       Pain Assessment: No/denies pain  Home Living  Prior Functioning/Environment              Frequency       Progress Toward Goals  OT Goals(current goals can now be found in the care plan section)  Progress towards OT goals: Goals met/education completed, patient discharged from [facility] All goals met and education completed, patient discharged from OT services    Co-evaluation                 End of Session     Activity Tolerance Other (comment) (nausea)   Patient Left in chair;with call bell/phone within reach   Nurse Communication          Time: 4129-0475 OT Time Calculation (min): 10 min  Charges: OT General Charges $OT Visit: 1 Procedure OT Treatments $Self Care/Home Management : 8-22 mins  Jules Schick [phone] 10/10/2013, 9:20 AM

## 2013-10-10 NOTE — Progress Notes (Signed)
Agree.  Unfortunately, nausea and vomiting persistent.  Will try to get her to goal tube feeds and then home on them.  If unsuccessful, may need home TPN.

## 2013-10-10 NOTE — Progress Notes (Signed)
Pt. Called RN into room. Pt. Stated she had a very full feeling and thought she might throw up. Pt. Began vomiting in front of RN. Pt. Was given IV nausea medications.  Pt. Requested that tube feedings be stopped. RN stopped tube feedings.  Will continue to monitor.

## 2013-10-10 NOTE — Progress Notes (Signed)
PT Cancellation Note  ___Treatment cancelled today due to medical issues with patient which prohibited therapy  ___ Treatment cancelled today due to patient receiving procedure or test   ___ Treatment cancelled today due to patient's refusal to participate   _X_ Treatment cancelled today due to pt's request "I had a really bad morning and now they are going to hook up that feeding tube".  Will continue to check back for mobility.  Rica Koyanagi  PTA [facility]  Acute  Rehab Pager      [phone]

## 2013-10-11 ENCOUNTER — Inpatient Hospital Stay (HOSPITAL_COMMUNITY): Payer: No Typology Code available for payment source

## 2013-10-11 DIAGNOSIS — E46 Unspecified protein-calorie malnutrition: Secondary | ICD-10-CM

## 2013-10-11 LAB — CBC
HCT: 25.5 % — ABNORMAL LOW (ref 36.0–46.0)
Hemoglobin: 8 g/dL — ABNORMAL LOW (ref 12.0–15.0)
MCH: 25.7 pg — ABNORMAL LOW (ref 26.0–34.0)
MCHC: 31.4 g/dL (ref 30.0–36.0)
MCV: 82 fL (ref 78.0–100.0)
PLATELETS: 377 10*3/uL (ref 150–400)
RBC: 3.11 MIL/uL — AB (ref 3.87–5.11)
RDW: 15.1 % (ref 11.5–15.5)
WBC: 6.9 10*3/uL (ref 4.0–10.5)

## 2013-10-11 LAB — COMPREHENSIVE METABOLIC PANEL
ALT: 48 U/L — ABNORMAL HIGH (ref 0–35)
ANION GAP: 16 — AB (ref 5–15)
AST: 18 U/L (ref 0–37)
Albumin: 2.9 g/dL — ABNORMAL LOW (ref 3.5–5.2)
Alkaline Phosphatase: 140 U/L — ABNORMAL HIGH (ref 39–117)
BUN: 6 mg/dL (ref 6–23)
CALCIUM: 8.5 mg/dL (ref 8.4–10.5)
CO2: 21 meq/L (ref 19–32)
CREATININE: 0.77 mg/dL (ref 0.50–1.10)
Chloride: 99 mEq/L (ref 96–112)
Glucose, Bld: 83 mg/dL (ref 70–99)
Potassium: 3.9 mEq/L (ref 3.7–5.3)
SODIUM: 136 meq/L — AB (ref 137–147)
Total Bilirubin: 0.3 mg/dL (ref 0.3–1.2)
Total Protein: 6.5 g/dL (ref 6.0–8.3)

## 2013-10-11 LAB — MAGNESIUM: MAGNESIUM: 1.6 mg/dL (ref 1.5–2.5)

## 2013-10-11 LAB — GLUCOSE, CAPILLARY: Glucose-Capillary: 121 mg/dL — ABNORMAL HIGH (ref 70–99)

## 2013-10-11 LAB — PHOSPHORUS: PHOSPHORUS: 3.2 mg/dL (ref 2.3–4.6)

## 2013-10-11 MED ORDER — SODIUM CHLORIDE 0.9 % IJ SOLN: 10.0000 mL | Freq: Two times a day (BID) | INTRAMUSCULAR | Status: DC

## 2013-10-11 MED ORDER — INSULIN ASPART 100 UNIT/ML ~~LOC~~ SOLN
0.0000 [IU] | Freq: Four times a day (QID) | SUBCUTANEOUS | Status: AC
  Administered 2013-10-12: 2 [IU] via SUBCUTANEOUS
  Administered 2013-10-12 (×2): 1 [IU] via SUBCUTANEOUS

## 2013-10-11 MED ORDER — MAGNESIUM SULFATE IN D5W 10-5 MG/ML-% IV SOLN
1.0000 g | Freq: Once | INTRAVENOUS | Status: AC
  Administered 2013-10-11: 1 g via INTRAVENOUS
  Filled 2013-10-11: qty 100

## 2013-10-11 MED ORDER — GADOBENATE DIMEGLUMINE 529 MG/ML IV SOLN
14.0000 mL | Freq: Once | INTRAVENOUS | Status: AC
  Administered 2013-10-11: 14 mL via INTRAVENOUS

## 2013-10-11 MED ORDER — HYDROMORPHONE HCL PF 1 MG/ML IJ SOLN
1.0000 mg | INTRAMUSCULAR | Status: DC | PRN
  Administered 2013-10-11 – 2013-10-12 (×6): 1 mg via INTRAVENOUS
  Filled 2013-10-11 (×7): qty 1

## 2013-10-11 MED ORDER — METOCLOPRAMIDE HCL 5 MG/ML IJ SOLN
5.0000 mg | Freq: Four times a day (QID) | INTRAMUSCULAR | Status: DC
  Administered 2013-10-11 – 2013-10-13 (×8): 5 mg via INTRAVENOUS
  Filled 2013-10-11 (×2): qty 2
  Filled 2013-10-11 (×8): qty 1
  Filled 2013-10-11: qty 2

## 2013-10-11 MED ORDER — INSULIN ASPART 100 UNIT/ML ~~LOC~~ SOLN: 0.0000 [IU] | SUBCUTANEOUS | Status: DC

## 2013-10-11 MED ORDER — PANTOPRAZOLE SODIUM 40 MG IV SOLR
40.0000 mg | Freq: Every day | INTRAVENOUS | Status: DC
Start: 2013-10-11 — End: 2013-10-13
  Administered 2013-10-11 – 2013-10-13 (×3): 40 mg via INTRAVENOUS
  Filled 2013-10-11 (×3): qty 40

## 2013-10-11 MED ORDER — IOHEXOL 300 MG/ML  SOLN
50.0000 mL | Freq: Once | INTRAMUSCULAR | Status: AC | PRN
  Administered 2013-10-11: 20 mL

## 2013-10-11 MED ORDER — METOPROLOL TARTRATE 1 MG/ML IV SOLN
5.0000 mg | Freq: Four times a day (QID) | INTRAVENOUS | Status: DC
  Administered 2013-10-11 – 2013-10-13 (×8): 5 mg via INTRAVENOUS
  Filled 2013-10-11 (×11): qty 5

## 2013-10-11 MED ORDER — IOHEXOL 300 MG/ML  SOLN: 50.0000 mL | Freq: Once | INTRAMUSCULAR | Status: DC | PRN

## 2013-10-11 MED ORDER — TECHNETIUM TC 99M MEDRONATE IV KIT
24.5000 | PACK | Freq: Once | INTRAVENOUS | Status: AC | PRN
  Administered 2013-10-11: 24.5 via INTRAVENOUS

## 2013-10-11 MED ORDER — SODIUM CHLORIDE 0.9 % IV SOLN
INTRAVENOUS | Status: AC
  Administered 2013-10-11 – 2013-10-12 (×2): via INTRAVENOUS

## 2013-10-11 MED ORDER — LORAZEPAM 2 MG/ML IJ SOLN
1.0000 mg | Freq: Four times a day (QID) | INTRAMUSCULAR | Status: DC | PRN
  Administered 2013-10-11 – 2013-10-13 (×4): 1 mg via INTRAVENOUS
  Filled 2013-10-11 (×4): qty 1

## 2013-10-11 MED ORDER — HYDROMORPHONE HCL PF 1 MG/ML IJ SOLN
1.0000 mg | Freq: Once | INTRAMUSCULAR | Status: AC
Start: 2013-10-11 — End: 2013-10-11
  Administered 2013-10-11: 1 mg via INTRAVENOUS
  Filled 2013-10-11: qty 1

## 2013-10-11 MED ORDER — TRACE MINERALS CR-CU-F-FE-I-MN-MO-SE-ZN IV SOLN
INTRAVENOUS | Status: AC
  Administered 2013-10-11: 18:00:00 via INTRAVENOUS
  Filled 2013-10-11: qty 1000

## 2013-10-11 MED ORDER — FAT EMULSION 20 % IV EMUL
250.0000 mL | INTRAVENOUS | Status: AC
  Administered 2013-10-11: 250 mL via INTRAVENOUS
  Filled 2013-10-11: qty 250

## 2013-10-11 NOTE — Progress Notes (Signed)
Pt actively vomiting dark green emesis. Pt again requesting IV pain medicine. Dr. Ernestina Patches on call paged and orders received for a one time dose of IV dilaudid and to attach pt's G tube to gravity drainage. This RN to complete orders and continue to monitor. Noreene Larsson RN, BSN
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
## 2013-10-11 NOTE — Progress Notes (Signed)
Patient with active vomiting this am.  Unable to tolerate any po meds.  Patient with complaint of pain and requesting IV Dilaudid.  Dr. Rockne Menghini notified and orders placed.  Zandra Abts [facility]  10/11/2013

## 2013-10-11 NOTE — Progress Notes (Signed)
Increased nausea and vomiting after j-tube feeds.  Will do a contrast study through j-tube to make sure it is properly positioned.

## 2013-10-11 NOTE — Progress Notes (Signed)
Patient ID: Paula [name], female   DOB: 01/31/1966, 48 y.o.   MRN: [id number] 21 Days Post-Op  Subjective: Pt states she got full with the TFs and continued to have nausea and emesis.  Refuses to take anything but IV pain meds as she states the oral make her nauseated and throw up and therefore don't work  Objective: Vital signs in last 24 hours: Temp:  [98.6 F (37 C)-99.1 F (37.3 C)] 98.6 F (37 C) (08/21 7035) Pulse Rate:  [78-120] 102 (08/21 0614) Resp:  [18] 18 (08/21 0614) BP: (110-144)/(63-84) 113/63 mmHg (08/21 0614) SpO2:  [98 %-100 %] 100 % (08/21 0614) Last BM Date: 10/06/13  Intake/Output from previous day: 08/20 0701 - 08/21 0700 In: 2068.5 [I.V.:2068.5] Out: -  Intake/Output this shift:    PE: Abd: soft, GJ tube in place with g portion to gravity.  Lab Results:   Recent Labs  10/11/13 0451  WBC 6.9  HGB 8.0*  HCT 25.5*  PLT 377   BMET  Recent Labs  10/11/13 0451  NA 136*  K 3.9  CL 99  CO2 21  GLUCOSE 83  BUN 6  CREATININE 0.77  CALCIUM 8.5   PT/INR No results found for this basename: LABPROT, INR,  in the last 72 hours CMP     Component Value Date/Time   NA 136* 10/11/2013 0451   K 3.9 10/11/2013 0451   CL 99 10/11/2013 0451   CO2 21 10/11/2013 0451   GLUCOSE 83 10/11/2013 0451   BUN 6 10/11/2013 0451   CREATININE 0.77 10/11/2013 0451   CALCIUM 8.5 10/11/2013 0451   PROT 6.5 10/11/2013 0451   ALBUMIN 2.9* 10/11/2013 0451   AST 18 10/11/2013 0451   ALT 48* 10/11/2013 0451   ALKPHOS 140* 10/11/2013 0451   BILITOT 0.3 10/11/2013 0451   GFRNONAA >90 10/11/2013 0451   GFRAA >90 10/11/2013 0451   Lipase     Component Value Date/Time   LIPASE 26 09/14/2013 2103       Studies/Results: Dg Abd 1 View  10/09/2013   CLINICAL DATA:  Fecal impaction.  EXAM: ABDOMEN - 1 VIEW  COMPARISON:  September 26, 2013.  FINDINGS: Residual contrast is noted throughout the colon. There is no evidence of bowel obstruction or ileus. Gastrostomy tube is noted  in the left upper quadrant. Phleboliths are noted in the pelvis.  IMPRESSION: No evidence of bowel obstruction or ileus. Residual contrast is noted throughout the colon.   Electronically Signed   By: Sabino Dick M.D.   On: 10/09/2013 15:24    Anti-infectives: Anti-infectives   Start     Dose/Rate Route Frequency Ordered Stop   09/20/13 1915  cefOXitin (MEFOXIN) 1 g in dextrose 5 % 50 mL IVPB     1 g 100 mL/hr over 30 Minutes Intravenous Every 6 hours 09/20/13 1903 09/21/13 0703   09/20/13 0600  [MAR Hold]  cefOXitin (MEFOXIN) 2 g in dextrose 5 % 50 mL IVPB     (On MAR Hold since 09/20/13 1215)  Comments:  Pharmacy may adjust dosing strength, interval, or rate of medication as needed for optimal therapy for the patient Send with patient on call to the OR.  Anesthesia to complete antibiotic administration <35min prior to incision per [facility].   2 g 100 mL/hr over 30 Minutes Intravenous On call to O.R. 09/19/13 1119 09/20/13 1315       Assessment/Plan   1. POD 20, Gastric Carcinoma; S/p Diagnostic laparoscopy, distal gastrectomy with  billroth II anastamosis and feeding gastrojejunostomy tube, [name], MD - 09/20/13.  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 Hypertension/tachycardia  3. Hx of migraine/resolving subdural hematoma  4. Back pain, severe, unknown etiology  5. Incontinence  6. PCM  7. Anemia  8. Ongoing post op nausea and vomiting  9. Pain control   Plan: 1. Will DC TFs and start TNA and get them to begin cycling it at night when able. 2. For full body bone scan to determine if she has any mets in her bones given severe back pain 3. g tube to gravity right now.  Patient still complaining of nausea despite this being to gravity.  Would continue NPO for now.  For now I suspect pain meds will have to be given IV, despite our attempts at trying to convert her to oral pain meds.   LOS: 27 days    Paula [name] E 10/11/2013, 8:03 AM Pager: [phone]

## 2013-10-11 NOTE — Progress Notes (Signed)
Pt actively vomiting, not responding to phenergan or zofran. Pt requesting pain medication, last PO dilaudid taken at 1412. On call Dr. Ernestina Patches paged and orders received for a one time dose of IV ativan and IV dilaudid. This RN to continue to monitor.  2140, pt resting comfortably. Noreene Larsson RN, BSN
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
## 2013-10-11 NOTE — Progress Notes (Signed)
PARENTERAL NUTRITION CONSULT NOTE - INITIAL consult  Pharmacy Consult for TNA Indication: Gastric adenocarcinoma, s/p distal gastrectomy with Billroth II anastomosis and feeding gastrojejunostomy tube placement , intractable N/V  No Known Allergies  Patient Measurements: Height: 5' (152.4 cm) Weight: 155 lb 6.8 oz (70.5 kg) IBW/kg (Calculated) : 45.5 Adjusted Body Weight: 62  Vital Signs: Temp: 98.6 F (37 C) (08/21 0614) Temp src: Oral (08/21 0614) BP: 113/63 mmHg (08/21 0614) Pulse Rate: 102 (08/21 0614) Intake/Output from previous day: 08/20 0701 - 08/21 0700 In: 2068.5 [I.V.:2068.5] Out: -    Intake/Output from this shift:    Labs:  Recent Labs  10/11/13 0451  WBC 6.9  HGB 8.0*  HCT 25.5*  PLT 377     Recent Labs  10/11/13 0451 10/11/13 0541  NA 136*  --   K 3.9  --   CL 99  --   CO2 21  --   GLUCOSE 83  --   BUN 6  --   CREATININE 0.77  --   CALCIUM 8.5  --   MG  --  1.6  PHOS  --  3.2  PROT 6.5  --   ALBUMIN 2.9*  --   AST 18  --   ALT 48*  --   ALKPHOS 140*  --   BILITOT 0.3  --    Estimated Creatinine Clearance: 77 ml/min (by C-G formula based on Cr of 0.77).   No results found for this basename: GLUCAP,  in the last 72 hours  Medications:  Scheduled:  . enoxaparin (LOVENOX) injection  40 mg Subcutaneous Q24H  . metoCLOPramide  10 mg Oral TID AC  . metoprolol tartrate  50 mg Oral BID  . multivitamin with minerals  1 tablet Oral Q24H  . pantoprazole  40 mg Oral BID  . protein supplement  1 packet Oral BID BM  . traMADol  50 mg Oral 4 times per day   Infusions:  . sodium chloride 100 mL/hr at 10/10/13 2349   Assessment: 48 yo female with newly diagnosed poorly differentiated gastric adenocarcinoma, s/p distal gastrectomy with feeding gastrojejunostomy placement 7/31.  TNA was started on 7/30 for nutritional support until able to use enteral / oral routes, subsequently discontinued on 8/14 with toleration of TFs and trying PO diet.  Patient immediately experienced continued nausea and emesis with POs, TFs were tried again 8/20 and patient was not able to tolerate. Now Pharmacy is consulted to restart TNA to guaranteed nutrition and hydration with hopes to transition to cyclic TNA at night as soon as possible.  TNA will be initiated slowly and increased to goal rate as quickly as possible based on patient's clinical status. Once TNA is running at goal rate, and patient is stable, then Pharmacy can transition to cyclic schedule. Note- this may take several days.  Nutritional Goals:   Per RD assessment, 1800-2000 kCal,  85-95 grams of protein per day.    Osmolite 1.2 at goal rate of 65 mL/hr would provide 86g protein and 1872 kcal/day  Clinimix 5/15 at goal rate of 80 ml/hr + lipids 20% at 10 mL/hr would provide 96g protein and 1843 kcal/day   8/21:  D#1 TNA restart.   TNA Access: PICC (placed 7/30) POD #20    Current Nutrition:  NPO  IVF:  NS @ 100 ml/hr   Insulin Requirements in the past 24 hours:  No insulin on board   Labs (last serum chemistries checked 8/13):  Lytes: Na low but improved/stable.  All other lytes WNL. Mag decreased to 1.6  Renal: SCr WNL, stable  Hepatic: Alk phos remains elevated but <2x ULN.  Transaminases improving. Bilirubin remains WNL.  CBGs: not checked since 8/14  TG: remains WNL (last checked 8/10)  Prealb: improving on last check.  Trend: 12.7 (7/31), 9.3 (8/3), 8.8 (8/4), 14.5 (8/10), 15.3 (8/16)   Plan:   At 1800 start Clinimix E 5/15 at rate of 40 ml/hr and 20% lipid emulsion at rate of 10 ml/hr  Will eventually transition to cyclic administration as soon as able  Start q6h CBG/SSI after TNA initiated  TNA to contain MVI and trace elements daily  Discontinue PO MVI  Reduce IVF to 60 ml/hr to account for fluid in TNA  TNA lab panel in AM and qMon/Thurs  Magnesium sulfate 1g IV x1  Pharmacy will follow up daily  Thank you for the consult.  Currie Paris, PharmD, BCPS Pager: [phone] Pharmacy: [phone] 10/11/2013 10:54 AM

## 2013-10-11 NOTE — Progress Notes (Signed)
Pt was sitting up in bed when I arrived w/several visitors bedside. Her voice seemed stronger today. Ernest Haber Chaplain  10/11/13 1400  Clinical Encounter Type  Visited With Patient and family together

## 2013-10-12 LAB — COMPREHENSIVE METABOLIC PANEL
ALT: 35 U/L (ref 0–35)
ANION GAP: 10 (ref 5–15)
AST: 13 U/L (ref 0–37)
Albumin: 2.7 g/dL — ABNORMAL LOW (ref 3.5–5.2)
Alkaline Phosphatase: 121 U/L — ABNORMAL HIGH (ref 39–117)
BUN: 5 mg/dL — AB (ref 6–23)
CALCIUM: 8.6 mg/dL (ref 8.4–10.5)
CO2: 25 mEq/L (ref 19–32)
CREATININE: 0.73 mg/dL (ref 0.50–1.10)
Chloride: 101 mEq/L (ref 96–112)
GFR calc non Af Amer: >90 mL/min (ref >90)
GLUCOSE: 147 mg/dL — AB (ref 70–99)
Potassium: 3.9 mEq/L (ref 3.7–5.3)
Sodium: 136 mEq/L — ABNORMAL LOW (ref 137–147)
TOTAL PROTEIN: 6 g/dL (ref 6.0–8.3)
Total Bilirubin: <0.2 mg/dL — ABNORMAL LOW (ref 0.3–1.2)

## 2013-10-12 LAB — GLUCOSE, CAPILLARY
GLUCOSE-CAPILLARY: 139 mg/dL — AB (ref 70–99)
Glucose-Capillary: 136 mg/dL — ABNORMAL HIGH (ref 70–99)
Glucose-Capillary: 156 mg/dL — ABNORMAL HIGH (ref 70–99)
Glucose-Capillary: 140 mg/dL — ABNORMAL HIGH (ref 70–99)

## 2013-10-12 LAB — DIFFERENTIAL
BASOS ABS: 0 10*3/uL (ref 0.0–0.1)
Basophils Relative: 0 % (ref 0–1)
Eosinophils Absolute: 0.3 10*3/uL (ref 0.0–0.7)
Eosinophils Relative: 6 % — ABNORMAL HIGH (ref 0–5)
Lymphocytes Relative: 29 % (ref 12–46)
Lymphs Abs: 1.6 10*3/uL (ref 0.7–4.0)
Monocytes Absolute: 0.5 10*3/uL (ref 0.1–1.0)
Monocytes Relative: 9 % (ref 3–12)
Neutro Abs: 3.2 10*3/uL (ref 1.7–7.7)
Neutrophils Relative %: 56 % (ref 43–77)

## 2013-10-12 LAB — CBC
HCT: 23.8 % — ABNORMAL LOW (ref 36.0–46.0)
Hemoglobin: 7.6 g/dL — ABNORMAL LOW (ref 12.0–15.0)
MCH: 25.6 pg — ABNORMAL LOW (ref 26.0–34.0)
MCHC: 31.9 g/dL (ref 30.0–36.0)
MCV: 80.1 fL (ref 78.0–100.0)
PLATELETS: 340 10*3/uL (ref 150–400)
RBC: 2.97 MIL/uL — ABNORMAL LOW (ref 3.87–5.11)
RDW: 14.8 % (ref 11.5–15.5)
WBC: 5.7 10*3/uL (ref 4.0–10.5)

## 2013-10-12 LAB — PREALBUMIN: Prealbumin: 11.3 mg/dL — ABNORMAL LOW (ref 17.0–34.0)

## 2013-10-12 LAB — PHOSPHORUS: Phosphorus: 2.8 mg/dL (ref 2.3–4.6)

## 2013-10-12 LAB — TRIGLYCERIDES: Triglycerides: 129 mg/dL (ref <150)

## 2013-10-12 LAB — MAGNESIUM: MAGNESIUM: 1.5 mg/dL (ref 1.5–2.5)

## 2013-10-12 MED ORDER — INSULIN ASPART 100 UNIT/ML ~~LOC~~ SOLN: 0.0000 [IU] | Freq: Four times a day (QID) | SUBCUTANEOUS | Status: DC

## 2013-10-12 MED ORDER — INSULIN ASPART 100 UNIT/ML ~~LOC~~ SOLN
0.0000 [IU] | SUBCUTANEOUS | Status: DC
  Administered 2013-10-12 – 2013-10-13 (×2): 2 [IU] via SUBCUTANEOUS
  Administered 2013-10-13: 3 [IU] via SUBCUTANEOUS
  Administered 2013-10-13 (×2): 2 [IU] via SUBCUTANEOUS

## 2013-10-12 MED ORDER — HYDROMORPHONE HCL PF 1 MG/ML IJ SOLN
0.5000 mg | INTRAMUSCULAR | Status: DC | PRN
  Administered 2013-10-12 – 2013-10-13 (×6): 0.5 mg via INTRAVENOUS
  Filled 2013-10-12 (×6): qty 1

## 2013-10-12 MED ORDER — FAT EMULSION 20 % IV EMUL
250.0000 mL | INTRAVENOUS | Status: DC
  Administered 2013-10-12: 250 mL via INTRAVENOUS
  Filled 2013-10-12: qty 250

## 2013-10-12 MED ORDER — SODIUM CHLORIDE 0.9 % IV SOLN
INTRAVENOUS | Status: DC
  Administered 2013-10-12: 18:00:00 via INTRAVENOUS

## 2013-10-12 MED ORDER — TRACE MINERALS CR-CU-F-FE-I-MN-MO-SE-ZN IV SOLN
INTRAVENOUS | Status: DC
  Administered 2013-10-12: 18:00:00 via INTRAVENOUS
  Filled 2013-10-12: qty 2000
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
## 2013-10-12 NOTE — Progress Notes (Signed)
10/12/2013 1100 NCM spoke to son, Keturah Barre # [phone]. States they have adequate caregivers at home to assist pt once dc. Son states they have received DME from [facility]. NCM contacted [facility] and they can do soc 10/13/2013 for [facility] RN to set up TPN. Jonnie Finner RN CCM Case Mgmt phone [phone]

## 2013-10-12 NOTE — Progress Notes (Signed)
22 Days Post-Op  Subjective: No significant changes.  On TPN.  Objective: Vital signs in last 24 hours: Temp:  [97.8 F (36.6 C)-98.6 F (37 C)] 98.6 F (37 C) (08/22 0544) Pulse Rate:  [92-107] 97 (08/22 0544) Resp:  [18] 18 (08/22 0544) BP: (101-122)/(66-78) 119/66 mmHg (08/22 0544) SpO2:  [100 %] 100 % (08/22 0544) Last BM Date: 10/11/13  Intake/Output from previous day: 08/21 0701 - 08/22 0700 In: 1980 [I.V.:1980] Out: -  Intake/Output this shift:    PE: General- In NAD Abdomen-soft,g/j-tube in  Lab Results:   Recent Labs  10/11/13 0451 10/12/13 0550  WBC 6.9 5.7  HGB 8.0* 7.6*  HCT 25.5* 23.8*  PLT 377 340   BMET  Recent Labs  10/11/13 0451 10/12/13 0550  NA 136* 136*  K 3.9 3.9  CL 99 101  CO2 21 25  GLUCOSE 83 147*  BUN 6 5*  CREATININE 0.77 0.73  CALCIUM 8.5 8.6   PT/INR No results found for this basename: LABPROT, INR,  in the last 72 hours Comprehensive Metabolic Panel:    Component Value Date/Time   NA 136* 10/12/2013 0550   NA 136* 10/11/2013 0451   K 3.9 10/12/2013 0550   K 3.9 10/11/2013 0451   CL 101 10/12/2013 0550   CL 99 10/11/2013 0451   CO2 25 10/12/2013 0550   CO2 21 10/11/2013 0451   BUN 5* 10/12/2013 0550   BUN 6 10/11/2013 0451   CREATININE 0.73 10/12/2013 0550   CREATININE 0.77 10/11/2013 0451   GLUCOSE 147* 10/12/2013 0550   GLUCOSE 83 10/11/2013 0451   CALCIUM 8.6 10/12/2013 0550   CALCIUM 8.5 10/11/2013 0451   AST 13 10/12/2013 0550   AST 18 10/11/2013 0451   ALT 35 10/12/2013 0550   ALT 48* 10/11/2013 0451   ALKPHOS 121* 10/12/2013 0550   ALKPHOS 140* 10/11/2013 0451   BILITOT <0.2* 10/12/2013 0550   BILITOT 0.3 10/11/2013 0451   PROT 6.0 10/12/2013 0550   PROT 6.5 10/11/2013 0451   ALBUMIN 2.7* 10/12/2013 0550   ALBUMIN 2.9* 10/11/2013 0451     Studies/Results: Dg Abd 1 View  10/11/2013   CLINICAL DATA:  Intolerance of jejunostomy tube feedings.  EXAM: ABDOMEN - 1 VIEW  COMPARISON:  Abdomen series 8/19 2015.  FINDINGS:  Surgical sutures left upper quadrant. Small amount of residual contrast in the colon from prior studies. Jejunostomy tube was injected with nonionic contrast. Jejunostomy tube is widely patent. Proximal small bowel is nondilated.  IMPRESSION: 1. Patent jejunostomy tube.  Proximal small bowel nondilated. 2. No acute abnormality.   Electronically Signed   By: Marcello Moores  Register   On: 10/11/2013 12:11   Mr Lumbar Spine W Wo Contrast  10/11/2013   CLINICAL DATA:  Gastric carcinoma.  Abnormal bone scan at L1.  EXAM: MRI LUMBAR SPINE WITHOUT AND WITH CONTRAST  TECHNIQUE: Multiplanar and multiecho pulse sequences of the lumbar spine were obtained without and with intravenous contrast.  CONTRAST:  15 mL MultiHance IV  COMPARISON:  Bone scan 10/11/2013  FINDINGS: Transitional anatomy.  L5 is partially incorporated into the sacrum.  Negative for fracture or mass lesion. Vertebral body signal is normal with particular attention L1. No evidence of bone marrow edema or mass. No enhancing lesions are seen postcontrast infusion. Etiology of the uptake on the bone scan is unclear.  Negative for fracture. Conus medullaris is normal. No significant degenerative change in the lumbar spine. No disc protrusion or spinal stenosis.  IMPRESSION: Negative for  fracture or metastatic disease. Bone marrow signal is normal. No explanation for the bone scan finding at L1.   Electronically Signed   By: Franchot Gallo M.D.   On: 10/11/2013 19:56   Nm Bone Scan Whole Body  10/11/2013   CLINICAL DATA:  Back pain.  Signet cell gastric carcinoma.  EXAM: NUCLEAR MEDICINE WHOLE BODY BONE SCAN  TECHNIQUE: Whole body anterior and posterior images were obtained approximately 3 hours after intravenous injection of radiopharmaceutical.  RADIOPHARMACEUTICALS:  24.5 mCi Technetium-99 MDP  COMPARISON:  CT chest, abdomen, and pelvis 09/13/2013  FINDINGS: There is a small focus of increased radiotracer uptake in the spine slightly left of midline at  approximately L1, best seen on the anterior images. Uptake throughout the remainder of the axial skeleton is symmetric. Uptake involving the left great toe is likely degenerative. Normal uptake is seen in the kidneys with excretion in the bladder.  IMPRESSION: Focal uptake in the spine at approximately L1, with considerations including degenerative change as well as metastasis. Further evaluation with MRI may be helpful.   Electronically Signed   By: Logan Bores   On: 10/11/2013 11:42    Anti-infectives: Anti-infectives   Start     Dose/Rate Route Frequency Ordered Stop   09/20/13 1915  cefOXitin (MEFOXIN) 1 g in dextrose 5 % 50 mL IVPB     1 g 100 mL/hr over 30 Minutes Intravenous Every 6 hours 09/20/13 1903 09/21/13 0703   09/20/13 0600  [MAR Hold]  cefOXitin (MEFOXIN) 2 g in dextrose 5 % 50 mL IVPB     (On MAR Hold since 09/20/13 1215)  Comments:  Pharmacy may adjust dosing strength, interval, or rate of medication as needed for optimal therapy for the patient Send with patient on call to the OR.  Anesthesia to complete antibiotic administration <64min prior to incision per [facility].   2 g 100 mL/hr over 30 Minutes Intravenous On call to O.R. 09/19/13 1119 09/20/13 1315      Assessment Principal Problem:   Gastric carcinoma s/p Distal gastrectomy/GJ (Billroth II) 09/20/2013-persistent nausea and oral intake intolerance continue; j-tube in good position.   LOS: 28 days   Plan: Home cyclic TPN and some liquids as tolerated.  Follow up with Dr. Barry Dienes in 2 weeks.   Paula [name] Paula [name] 10/12/2013

## 2013-10-12 NOTE — Progress Notes (Addendum)
PARENTERAL NUTRITION CONSULT NOTE - Follow up  Pharmacy Consult for TNA Indication: Gastric adenocarcinoma, s/p distal gastrectomy with Billroth II anastomosis and feeding gastrojejunostomy tube placement , intractable N/V  No Known Allergies  Patient Measurements: Height: 5' (152.4 cm) Weight: 155 lb 6.8 oz (70.5 kg) IBW/kg (Calculated) : 45.5 Adjusted Body Weight: 62  Vital Signs: Temp: 98.6 F (37 C) (08/22 0544) Temp src: Oral (08/22 0544) BP: 119/66 mmHg (08/22 0544) Pulse Rate: 97 (08/22 0544) Intake/Output from previous day: 08/21 0701 - 08/22 0700 In: 1980 [I.V.:1980] Out: -    Intake/Output from this shift:    Labs:  Recent Labs  10/11/13 0451 10/12/13 0550  WBC 6.9 5.7  HGB 8.0* 7.6*  HCT 25.5* 23.8*  PLT 377 340     Recent Labs  10/11/13 0451 10/11/13 0541 10/12/13 0550  NA 136*  --  136*  K 3.9  --  3.9  CL 99  --  101  CO2 21  --  25  GLUCOSE 83  --  147*  BUN 6  --  5*  CREATININE 0.77  --  0.73  CALCIUM 8.5  --  8.6  MG  --  1.6 1.5  PHOS  --  3.2 2.8  PROT 6.5  --  6.0  ALBUMIN 2.9*  --  2.7*  AST 18  --  13  ALT 48*  --  35  ALKPHOS 140*  --  121*  BILITOT 0.3  --  <0.2*  TRIG  --   --  129   Estimated Creatinine Clearance: 77 ml/min (by C-G formula based on Cr of 0.73).    Recent Labs  10/11/13 2345 10/12/13 0618  GLUCAP 121* 136*    Medications:  Scheduled:  . enoxaparin (LOVENOX) injection  40 mg Subcutaneous Q24H  . insulin aspart  0-9 Units Subcutaneous 4 times per day  . metoCLOPramide (REGLAN) injection  5 mg Intravenous 4 times per day  . metoprolol  5 mg Intravenous 4 times per day  . pantoprazole (PROTONIX) IV  40 mg Intravenous Daily  . protein supplement  1 packet Oral BID BM  . sodium chloride  10 mL Intravenous Q12H   Infusions:  . sodium chloride 60 mL/hr at 10/12/13 0005  . Marland KitchenTPN (CLINIMIX-E) Adult 40 mL/hr at 10/11/13 1738   And  . fat emulsion 250 mL (10/11/13 1737)   ASSESSMENT: 48 yo female  with newly diagnosed poorly differentiated gastric adenocarcinoma, s/p distal gastrectomy with feeding gastrojejunostomy placement 7/31.  TNA was started on 7/30 for nutritional support until able to use enteral / oral routes, subsequently discontinued on 8/14 with toleration of TFs and trying PO diet. Patient immediately experienced continued nausea and emesis with POs, TFs were tried again 8/20 and patient was not able to tolerate. Now Pharmacy is consulted to restart TNA to guaranteed nutrition and hydration with hopes to transition to cyclic TNA at night as soon as possible. TNA will be initiated slowly and increased to goal rate as quickly as possible based on patient's clinical status. Once TNA is running at goal rate, and patient is stable, then Pharmacy can transition to cyclic schedule. Note- this may take several days.  Significant events/clinical course: 8/21: TNA restarted. Mag bolus 1g.   Today 8/22:  CBGs - 121-147. 2 units SSI so far this bag.  Lytes - Na remains low but stable. All other lytes WNL. CorrCa 9.6.  Renal - SCr WNL, stable  Hepatic - Alk phos remains mildly elevated.  Transaminases and bilirubin wnl.  TG - 129  Prealb - pending. 15.3(8/16)  IVF: NS at 73m/hr  Current nutrition: NPO Unjury BID - not tolerating Clinimix E5/15 at 40 ml/hr + lipids 20% at 10 mL/hr   Nutritional Goals:  Per RD assessment, 1800-2000 kCal,  85-95 grams of protein per day.   Osmolite 1.2 at goal rate of 65 mL/hr would provide 86g protein and 1872 kcal/day Clinimix E5/15 at goal rate of 80 ml/hr + lipids 20% at 10 mL/hr would provide 96g protein and 1843 kcal/day  TPN access: PICC 7/30 TPN restarted: 8/21  PLAN:   Increase Clinimix E 5/15 to 681mhr and continue 20% lipid emulsion at 1043mr.  Will eventually transition to cyclic administration as soon as able.  TNA to contain MVI and trace elements daily.  Reduce IVF to 36m70m to account for fluid in TNA.  Increase  SSI to Moderate. Cont q6h CBGs.  TNA lab panels on Mondays & Thursdays.  F/u daily.  Tom Romeo RabonarmD, pager [phone]/2015,10:01 AM.

## 2013-10-13 ENCOUNTER — Inpatient Hospital Stay (HOSPITAL_COMMUNITY)
Admission: EM | Admit: 2013-10-13 | Discharge: 2013-10-17 | DRG: 392 | Disposition: A | Discharge status: Home Health | Payer: No Typology Code available for payment source | Attending: Internal Medicine | Admitting: Internal Medicine

## 2013-10-13 ENCOUNTER — Encounter (HOSPITAL_COMMUNITY): Payer: Self-pay | Admitting: Emergency Medicine

## 2013-10-13 ENCOUNTER — Emergency Department (HOSPITAL_COMMUNITY): Payer: No Typology Code available for payment source

## 2013-10-13 DIAGNOSIS — G43909 Migraine, unspecified, not intractable, without status migrainosus: Secondary | ICD-10-CM | POA: Diagnosis present

## 2013-10-13 DIAGNOSIS — Z6828 Body mass index (BMI) 28.0-28.9, adult: Secondary | ICD-10-CM

## 2013-10-13 DIAGNOSIS — K5900 Constipation, unspecified: Secondary | ICD-10-CM | POA: Diagnosis not present

## 2013-10-13 DIAGNOSIS — I340 Nonrheumatic mitral (valve) insufficiency: Secondary | ICD-10-CM

## 2013-10-13 DIAGNOSIS — E440 Moderate protein-calorie malnutrition: Secondary | ICD-10-CM | POA: Diagnosis present

## 2013-10-13 DIAGNOSIS — M546 Pain in thoracic spine: Secondary | ICD-10-CM

## 2013-10-13 DIAGNOSIS — R531 Weakness: Secondary | ICD-10-CM

## 2013-10-13 DIAGNOSIS — M545 Low back pain: Secondary | ICD-10-CM

## 2013-10-13 DIAGNOSIS — F4310 Post-traumatic stress disorder, unspecified: Secondary | ICD-10-CM

## 2013-10-13 DIAGNOSIS — M5489 Other dorsalgia: Secondary | ICD-10-CM

## 2013-10-13 DIAGNOSIS — M549 Dorsalgia, unspecified: Secondary | ICD-10-CM | POA: Diagnosis present

## 2013-10-13 DIAGNOSIS — K257 Chronic gastric ulcer without hemorrhage or perforation: Secondary | ICD-10-CM

## 2013-10-13 DIAGNOSIS — K219 Gastro-esophageal reflux disease without esophagitis: Secondary | ICD-10-CM | POA: Diagnosis present

## 2013-10-13 DIAGNOSIS — I10 Essential (primary) hypertension: Secondary | ICD-10-CM | POA: Diagnosis present

## 2013-10-13 DIAGNOSIS — F411 Generalized anxiety disorder: Secondary | ICD-10-CM | POA: Diagnosis present

## 2013-10-13 DIAGNOSIS — K259 Gastric ulcer, unspecified as acute or chronic, without hemorrhage or perforation: Secondary | ICD-10-CM | POA: Diagnosis present

## 2013-10-13 DIAGNOSIS — C169 Malignant neoplasm of stomach, unspecified: Secondary | ICD-10-CM | POA: Diagnosis present

## 2013-10-13 DIAGNOSIS — R1115 Cyclical vomiting syndrome unrelated to migraine: Secondary | ICD-10-CM | POA: Diagnosis not present

## 2013-10-13 DIAGNOSIS — F329 Major depressive disorder, single episode, unspecified: Secondary | ICD-10-CM | POA: Diagnosis present

## 2013-10-13 DIAGNOSIS — Z515 Encounter for palliative care: Secondary | ICD-10-CM

## 2013-10-13 DIAGNOSIS — F3289 Other specified depressive episodes: Secondary | ICD-10-CM | POA: Diagnosis present

## 2013-10-13 DIAGNOSIS — R112 Nausea with vomiting, unspecified: Secondary | ICD-10-CM

## 2013-10-13 DIAGNOSIS — R1110 Vomiting, unspecified: Secondary | ICD-10-CM | POA: Diagnosis present

## 2013-10-13 DIAGNOSIS — R0789 Other chest pain: Secondary | ICD-10-CM

## 2013-10-13 DIAGNOSIS — Z931 Gastrostomy status: Secondary | ICD-10-CM | POA: Diagnosis not present

## 2013-10-13 DIAGNOSIS — R011 Cardiac murmur, unspecified: Secondary | ICD-10-CM

## 2013-10-13 LAB — CBC WITH DIFFERENTIAL/PLATELET
BASOS ABS: 0 10*3/uL (ref 0.0–0.1)
Basophils Relative: 0 % (ref 0–1)
Eosinophils Absolute: 0.1 10*3/uL (ref 0.0–0.7)
Eosinophils Relative: 1 % (ref 0–5)
HCT: 31.3 % — ABNORMAL LOW (ref 36.0–46.0)
Hemoglobin: 10.1 g/dL — ABNORMAL LOW (ref 12.0–15.0)
Lymphocytes Relative: 18 % (ref 12–46)
Lymphs Abs: 1.4 10*3/uL (ref 0.7–4.0)
MCH: 25.8 pg — AB (ref 26.0–34.0)
MCHC: 32.3 g/dL (ref 30.0–36.0)
MCV: 80.1 fL (ref 78.0–100.0)
Monocytes Absolute: 0.5 10*3/uL (ref 0.1–1.0)
Monocytes Relative: 7 % (ref 3–12)
NEUTROS ABS: 5.7 10*3/uL (ref 1.7–7.7)
Neutrophils Relative %: 74 % (ref 43–77)
Platelets: 447 10*3/uL — ABNORMAL HIGH (ref 150–400)
RBC: 3.91 MIL/uL (ref 3.87–5.11)
RDW: 14.8 % (ref 11.5–15.5)
WBC: 7.7 10*3/uL (ref 4.0–10.5)

## 2013-10-13 LAB — GLUCOSE, CAPILLARY
GLUCOSE-CAPILLARY: 132 mg/dL — AB (ref 70–99)
Glucose-Capillary: 141 mg/dL — ABNORMAL HIGH (ref 70–99)
Glucose-Capillary: 143 mg/dL — ABNORMAL HIGH (ref 70–99)
Glucose-Capillary: 163 mg/dL — ABNORMAL HIGH (ref 70–99)

## 2013-10-13 LAB — COMPREHENSIVE METABOLIC PANEL
ALBUMIN: 3.6 g/dL (ref 3.5–5.2)
ALT: 70 U/L — ABNORMAL HIGH (ref 0–35)
ANION GAP: 14 (ref 5–15)
AST: 52 U/L — AB (ref 0–37)
Alkaline Phosphatase: 161 U/L — ABNORMAL HIGH (ref 39–117)
BILIRUBIN TOTAL: 0.4 mg/dL (ref 0.3–1.2)
BUN: 8 mg/dL (ref 6–23)
CALCIUM: 9.5 mg/dL (ref 8.4–10.5)
CHLORIDE: 96 meq/L (ref 96–112)
CO2: 23 mEq/L (ref 19–32)
Creatinine, Ser: 0.65 mg/dL (ref 0.50–1.10)
GFR calc Af Amer: >90 mL/min (ref >90)
GFR calc non Af Amer: >90 mL/min (ref >90)
Glucose, Bld: 113 mg/dL — ABNORMAL HIGH (ref 70–99)
Potassium: 3.8 mEq/L (ref 3.7–5.3)
Sodium: 133 mEq/L — ABNORMAL LOW (ref 137–147)
Total Protein: 7.8 g/dL (ref 6.0–8.3)

## 2013-10-13 LAB — LIPASE, BLOOD: LIPASE: 54 U/L (ref 11–59)

## 2013-10-13 LAB — BASIC METABOLIC PANEL
ANION GAP: 10 (ref 5–15)
BUN: 9 mg/dL (ref 6–23)
CO2: 27 mEq/L (ref 19–32)
CREATININE: 0.72 mg/dL (ref 0.50–1.10)
Calcium: 8.6 mg/dL (ref 8.4–10.5)
Chloride: 101 mEq/L (ref 96–112)
GFR calc non Af Amer: >90 mL/min (ref >90)
Glucose, Bld: 114 mg/dL — ABNORMAL HIGH (ref 70–99)
Potassium: 3.5 mEq/L — ABNORMAL LOW (ref 3.7–5.3)
Sodium: 138 mEq/L (ref 137–147)

## 2013-10-13 LAB — I-STAT TROPONIN, ED: TROPONIN I, POC: 0 ng/mL (ref 0.00–0.08)

## 2013-10-13 MED ORDER — HYDROMORPHONE HCL PF 1 MG/ML IJ SOLN
1.0000 mg | Freq: Once | INTRAMUSCULAR | Status: AC
  Administered 2013-10-13: 1 mg via INTRAVENOUS
  Filled 2013-10-13: qty 1

## 2013-10-13 MED ORDER — POLYETHYLENE GLYCOL 3350 17 G PO PACK
17.0000 g | PACK | Freq: Every day | ORAL | Status: DC | PRN
  Filled 2013-10-13: qty 1

## 2013-10-13 MED ORDER — PANTOPRAZOLE SODIUM 40 MG PO TBEC
40.0000 mg | DELAYED_RELEASE_TABLET | Freq: Every day | ORAL | Status: DC
Start: 2013-10-14 — End: 2013-10-17
  Administered 2013-10-15: 40 mg via ORAL
  Filled 2013-10-13 (×4): qty 1

## 2013-10-13 MED ORDER — HYDROMORPHONE HCL PF 1 MG/ML IJ SOLN
1.0000 mg | INTRAMUSCULAR | Status: DC | PRN
  Administered 2013-10-14 (×3): 1 mg via INTRAVENOUS
  Filled 2013-10-13 (×3): qty 1

## 2013-10-13 MED ORDER — DOCUSATE SODIUM 100 MG PO CAPS
100.0000 mg | ORAL_CAPSULE | Freq: Two times a day (BID) | ORAL | Status: DC
  Administered 2013-10-14 – 2013-10-15 (×2): 100 mg via ORAL
  Filled 2013-10-13 (×7): qty 1

## 2013-10-13 MED ORDER — POTASSIUM CHLORIDE 10 MEQ/50ML IV SOLN
10.0000 meq | INTRAVENOUS | Status: AC
  Administered 2013-10-13 (×3): 10 meq via INTRAVENOUS
  Filled 2013-10-13 (×3): qty 50

## 2013-10-13 MED ORDER — PROMETHAZINE HCL 25 MG RE SUPP: 25.0000 mg | Freq: Four times a day (QID) | RECTAL | Status: DC | PRN

## 2013-10-13 MED ORDER — FOLIC ACID 1 MG PO TABS
1.0000 mg | ORAL_TABLET | Freq: Every day | ORAL | Status: DC
  Administered 2013-10-15: 1 mg via ORAL
  Filled 2013-10-13 (×4): qty 1

## 2013-10-13 MED ORDER — METOCLOPRAMIDE HCL 5 MG PO TABS
5.0000 mg | ORAL_TABLET | Freq: Three times a day (TID) | ORAL | Status: DC
  Filled 2013-10-13 (×5): qty 1

## 2013-10-13 MED ORDER — SODIUM CHLORIDE 0.9 % IV SOLN: INTRAVENOUS | Status: DC

## 2013-10-13 MED ORDER — FENTANYL 25 MCG/HR TD PT72
25.0000 ug | MEDICATED_PATCH | TRANSDERMAL | Status: DC
  Administered 2013-10-16: 25 ug via TRANSDERMAL
  Filled 2013-10-13 (×2): qty 1

## 2013-10-13 MED ORDER — ONDANSETRON HCL 4 MG/2ML IJ SOLN
4.0000 mg | Freq: Once | INTRAMUSCULAR | Status: AC
  Administered 2013-10-13: 4 mg via INTRAVENOUS
  Filled 2013-10-13: qty 2

## 2013-10-13 MED ORDER — FLUOXETINE HCL 20 MG PO CAPS
20.0000 mg | ORAL_CAPSULE | Freq: Every day | ORAL | Status: DC
  Filled 2013-10-13 (×4): qty 1

## 2013-10-13 MED ORDER — TRACE MINERALS CR-CU-F-FE-I-MN-MO-SE-ZN IV SOLN
INTRAVENOUS | Status: DC
  Filled 2013-10-13: qty 2000

## 2013-10-13 MED ORDER — OXYCODONE HCL 5 MG PO TABS
5.0000 mg | ORAL_TABLET | ORAL | Status: DC | PRN
  Administered 2013-10-15: 5 mg via ORAL
  Filled 2013-10-13: qty 1

## 2013-10-13 MED ORDER — FAT EMULSION 20 % IV EMUL
250.0000 mL | INTRAVENOUS | Status: DC
  Filled 2013-10-13: qty 250

## 2013-10-13 MED ORDER — ADULT MULTIVITAMIN W/MINERALS CH
1.0000 | ORAL_TABLET | Freq: Every day | ORAL | Status: DC
  Administered 2013-10-15: 1 via ORAL
  Filled 2013-10-13 (×4): qty 1

## 2013-10-13 MED ORDER — DEXTROSE-NACL 5-0.9 % IV SOLN
INTRAVENOUS | Status: DC
  Administered 2013-10-14 – 2013-10-16 (×4): via INTRAVENOUS

## 2013-10-13 MED ORDER — ONDANSETRON HCL 4 MG PO TABS: 4.0000 mg | ORAL_TABLET | Freq: Four times a day (QID) | ORAL | Status: DC | PRN

## 2013-10-13 MED ORDER — MORPHINE SULFATE (CONCENTRATE) 10 MG /0.5 ML PO SOLN
10.0000 mg | ORAL | Status: DC | PRN
  Administered 2013-10-13: 10 mg via ORAL
  Filled 2013-10-13: qty 0.5

## 2013-10-13 MED ORDER — VITAMIN B-1 100 MG PO TABS
100.0000 mg | ORAL_TABLET | Freq: Every day | ORAL | Status: DC
  Administered 2013-10-15: 100 mg via ORAL
  Filled 2013-10-13 (×4): qty 1

## 2013-10-13 MED ORDER — CARVEDILOL 6.25 MG PO TABS
6.2500 mg | ORAL_TABLET | Freq: Two times a day (BID) | ORAL | Status: DC
  Administered 2013-10-15: 6.25 mg via ORAL
  Filled 2013-10-13 (×9): qty 1

## 2013-10-13 MED ORDER — SODIUM CHLORIDE 0.9 % IV BOLUS (SEPSIS)
1000.0000 mL | Freq: Once | INTRAVENOUS | Status: AC
  Administered 2013-10-13: 1000 mL via INTRAVENOUS

## 2013-10-13 MED ORDER — FENTANYL 25 MCG/HR TD PT72: 25.0000 ug | MEDICATED_PATCH | TRANSDERMAL | Status: DC

## 2013-10-13 MED ORDER — MORPHINE SULFATE (CONCENTRATE) 10 MG /0.5 ML PO SOLN: 10.0000 mg | ORAL | Status: DC | PRN

## 2013-10-13 MED ORDER — HEPARIN SODIUM (PORCINE) 5000 UNIT/ML IJ SOLN
5000.0000 [IU] | Freq: Three times a day (TID) | INTRAMUSCULAR | Status: DC
  Administered 2013-10-14 – 2013-10-16 (×6): 5000 [IU] via SUBCUTANEOUS
  Filled 2013-10-13 (×11): qty 1

## 2013-10-13 MED ORDER — ZOLPIDEM TARTRATE 5 MG PO TABS: 5.0000 mg | ORAL_TABLET | Freq: Every evening | ORAL | Status: DC | PRN

## 2013-10-13 MED ORDER — ONDANSETRON 4 MG PO TBDP: 4.0000 mg | ORAL_TABLET | Freq: Three times a day (TID) | ORAL | Status: DC | PRN

## 2013-10-13 MED ORDER — FENTANYL 25 MCG/HR TD PT72
25.0000 ug | MEDICATED_PATCH | TRANSDERMAL | Status: DC
  Administered 2013-10-13: 25 ug via TRANSDERMAL
  Filled 2013-10-13: qty 1

## 2013-10-13 MED ORDER — METOCLOPRAMIDE HCL 5 MG PO TABS: 5.0000 mg | ORAL_TABLET | Freq: Three times a day (TID) | ORAL | Status: DC

## 2013-10-13 MED ORDER — ONDANSETRON HCL 4 MG/2ML IJ SOLN
4.0000 mg | Freq: Four times a day (QID) | INTRAMUSCULAR | Status: DC | PRN
  Administered 2013-10-14 – 2013-10-16 (×5): 4 mg via INTRAVENOUS
  Filled 2013-10-13 (×5): qty 2

## 2013-10-13 MED ORDER — MORPHINE SULFATE (CONCENTRATE) 10 MG /0.5 ML PO SOLN
10.0000 mg | ORAL | Status: DC | PRN
  Administered 2013-10-14: 10 mg via ORAL
  Filled 2013-10-13: qty 0.5

## 2013-10-13 NOTE — Progress Notes (Addendum)
PARENTERAL NUTRITION CONSULT NOTE - Follow up  Pharmacy Consult for TNA Indication: Gastric adenocarcinoma, s/p distal gastrectomy with Billroth II anastomosis and feeding gastrojejunostomy tube placement , intractable N/V  No Known Allergies  Patient Measurements: Height: 5' (152.4 cm) Weight: 155 lb 6.8 oz (70.5 kg) IBW/kg (Calculated) : 45.5 Adjusted Body Weight: 62  Vital Signs: Temp: 98.7 F (37.1 C) (08/23 0528) Temp src: Oral (08/23 0528) BP: 105/63 mmHg (08/23 0528) Pulse Rate: 95 (08/23 0528) Intake/Output from previous day: 08/22 0701 - 08/23 0700 In: 480 [I.V.:480] Out: -    Intake/Output from this shift:    Labs:  Recent Labs  10/11/13 0451 10/12/13 0550  WBC 6.9 5.7  HGB 8.0* 7.6*  HCT 25.5* 23.8*  PLT 377 340     Recent Labs  10/11/13 0451 10/11/13 0541 10/12/13 0550 10/13/13 0600  NA 136*  --  136* 138  K 3.9  --  3.9 3.5*  CL 99  --  101 101  CO2 21  --  25 27  GLUCOSE 83  --  147* 114*  BUN 6  --  5* 9  CREATININE 0.77  --  0.73 0.72  CALCIUM 8.5  --  8.6 8.6  MG  --  1.6 1.5  --   PHOS  --  3.2 2.8  --   PROT 6.5  --  6.0  --   ALBUMIN 2.9*  --  2.7*  --   AST 18  --  13  --   ALT 48*  --  35  --   ALKPHOS 140*  --  121*  --   BILITOT 0.3  --  <0.2*  --   PREALBUMIN  --   --  11.3*  --   TRIG  --   --  129  --    Estimated Creatinine Clearance: 77 ml/min (by C-G formula based on Cr of 0.72).    Recent Labs  10/13/13 0017 10/13/13 0407 10/13/13 0738  GLUCAP 143* 141* 132*   Insulin Requirements:   8 units SSI first 12hrs on current bag.  Current Nutrition:   NPO Unjury BID - not tolerating Clinimix E5/15 at 93m/hr + lipids 20% at 197mhr   IVF: NS at 4061mr  Central access: PICC 7/30   TPN start date: 8/21  ASSESSMENT                                                                                                          HPI: [age] female with newly diagnosed poorly differentiated gastric adenocarcinoma, s/p  distal gastrectomy with feeding gastrojejunostomy placement 7/31.  TNA was started on 7/30 for nutritional support until able to use enteral / oral routes, subsequently discontinued on 8/14 with toleration of TFs and trying PO diet. Patient immediately experienced continued nausea and emesis with POs, TFs were tried again 8/20 and patient was not able to tolerate. Now Pharmacy is consulted to restart TNA to guaranteed nutrition and hydration with hopes to transition to cyclic TNA at night as soon as possible. TNA will  be initiated slowly and increased to goal rate as quickly as possible based on patient's clinical status. Once TNA is running at goal rate, and patient is stable, then Pharmacy can transition to cyclic schedule. Note- this may take several days.  Significant events:     8/21: TNA restarted. Mag bolus 1g.  8/22: TNA increase to 46m/hr + 121mhr. SSI increased to moderate. CBGs increased to q4h.  Today:    CBGs - 114-143(Goal <150).   Lytes - Na low but improved. K decreased, likely from increased insulin admin. CorrCa 9.6.  Renal - SCr WNL, stable  Hepatic - Alk phos remains mildly elevated.  Transaminases and bilirubin wnl.  TG - 129  Prealb - 15.3 -> 11.3  NUTRITIONAL GOALS                                                                                             Per RD assessment, 1800-2000 kCal,  85-95 grams of protein per day.   Osmolite 1.2 at goal rate of 65 mL/hr would provide 86g protein and 1872 kcal/day Clinimix E5/15 at goal rate of 80 ml/hr + lipids 20% at 10 mL/hr would provide 96g protein and 1843 kcal/day  PLAN                                                                                                                          With next bag increase Clinimix E 5/15 to 8066mr and continue 20% lipid emulsion at 85m23m.  Recommend start cycling TNA 8/24 if labs/glucose ok.  TNA to contain MVI and trace elements daily.  Reduce IVF to 20ml13mto account  for fluid in TNA.  Give KCl 30meq88mtoday.  Cont Moderate SSI. CBGs q4h per MD.  TNA lab panels on Mondays & Thursdays.  F/u daily.  Tom PiRomeo RabonmD, pager [phone]/2015,1:45 PM.

## 2013-10-13 NOTE — ED Notes (Signed)
Pt arrived to the ED with a compliant of abdominal pain with associated emesis and back and chest pain.  Pt states she was discharged today from oncology where she spent 28 days after surgery discovered cancer.  Pt has has continuous emesis for an hour.

## 2013-10-13 NOTE — H&P (Signed)
[facility] History and Physical  Dalyce A [name] PYK:[id number] DOB: 1965/07/22 DOA: 10/13/2013  Referring physician: Quincy Carnes, PA PCP: Arnoldo Morale, MD   Chief Complaint: Vomiting  HPI: Paula [name] is a 48 y.o. female with recent diagnosis of gastric carcinoma presents with intractable vomiting. Patient was discharged today from the hospital after a month long stay. She has been admitted on July 25th for chest and abdominal pain. Patient was evaluated by GI and had an EGD which revealed presence of 3 antral ulcers. Pathology however revealed poorly differentiated adenocarcinoma and the patient underwent distal gastrectomy with a Billroth II anastomosis. Patient had ongoing issues with vomiting and therefore her hospital stay was prolonged. Patient states that she went home today and came back after only an hour of being home. She states that she had been having pain in her abdomen still. She denies having hematemesis and she states there was no hemoptysis. Patient states she has not had any bloody stools. She feels weak and has been tired. She has no shortness of breath noted. She states that she has been having excessive dry heaves.   Review of Systems:  Complete 12 point ROS is performed and unremarkable other than what is noted in HPI  Past Medical History  Diagnosis Date  . Headache(784.0) 2011    Migraines  . Esophageal reflux     On omeprazole  . Costochondritis   . Hypertension     started around 2011  . Ovarian cyst    Past Surgical History  Procedure Laterality Date  . Oophorectomy      Around 1985 left ovary removal  . Tonsillectomy      Around 1994  . Tubal ligation    . Esophagogastroduodenoscopy N/A 09/16/2013    Procedure: ESOPHAGOGASTRODUODENOSCOPY (EGD);  Surgeon: Wonda Horner, MD;  Location: [facility];  Service: Endoscopy;  Laterality: N/A;  . Laparoscopy N/A 09/20/2013    Procedure: DIAGNOSTIC LAPAROSCOPY,DISTAL GASTRECTOMY AND  FEEDING GASTROJEJUNOSTOMY;  Surgeon: [name], MD;  Location: [facility];  Service: General;  Laterality: N/A;   Social History:  reports that she has quit smoking. She has never used smokeless tobacco. She reports that she drinks alcohol. She reports that she does not use illicit drugs.  No Known Allergies  Family History  Problem Relation Age of Onset  . Hypertension Mother   . Diabetes Mellitus II Mother   . Hypertension Sister   . Hypertension Brother   . Heart failure Maternal Grandmother   . Hypertension Maternal Grandfather      Prior to Admission medications   Medication Sig Start Date End Date Taking? Authorizing Provider  carvedilol (COREG) 6.25 MG tablet Take 6.25 mg by mouth 2 (two) times daily with a meal.   Yes Historical Provider, MD  FLUoxetine (PROZAC) 20 MG capsule Take 1 capsule (20 mg total) by mouth daily. 08/15/13  Yes Dene Gentry, MD  omeprazole (PRILOSEC) 20 MG capsule Take 1 capsule (20 mg total) by mouth 2 (two) times daily. 09/13/13  Yes Houston Siren III, MD  fentaNYL (DURAGESIC - DOSED MCG/HR) 25 MCG/HR patch Place 1 patch (25 mcg total) onto the skin every 3 (three) days. 10/13/13   Venetia Maxon Rama, MD  metoCLOPramide (REGLAN) 5 MG tablet Take 1 tablet (5 mg total) by mouth 4 (four) times daily -  before meals and at bedtime. 10/13/13   Venetia Maxon Rama, MD  Morphine Sulfate (MORPHINE CONCENTRATE) 10 mg / 0.5 ml concentrated solution Take 0.5 mLs (  10 mg total) by mouth every 3 (three) hours as needed for severe pain. 10/13/13   Venetia Maxon Rama, MD  ondansetron (ZOFRAN ODT) 4 MG disintegrating tablet Take 1 tablet (4 mg total) by mouth every 8 (eight) hours as needed for nausea or vomiting. 10/13/13   Venetia Maxon Rama, MD  promethazine (PHENERGAN) 25 MG suppository Place 1 suppository (25 mg total) rectally every 6 (six) hours as needed for nausea, vomiting or refractory nausea / vomiting. 10/13/13   Venetia Maxon Rama, MD   Physical Exam: Filed Vitals:    10/13/13 1915  BP: 152/96  Pulse: 120  Temp: 99.8 F (37.7 C)  TempSrc: Oral  Resp: 18  SpO2: 99%    Wt Readings from Last 3 Encounters:  10/02/13 70.5 kg (155 lb 6.8 oz)  10/02/13 70.5 kg (155 lb 6.8 oz)  10/02/13 70.5 kg (155 lb 6.8 oz)    General:  Appears calm and comfortable Eyes: PERRL, normal lids ENT: grossly normal hearing Neck: no LAD, massesy Cardiovascular: RRR, no m/r/g. No LE edema. Telemetry: SR, tachycardia Respiratory: CTA bilaterally, no w/r/r. Normal respiratory effort. Abdomen: soft, ++tenderness diffusely Skin: no rash or induration Musculoskeletal: grossly normal tone BUE/BLE Psychiatric: grossly normal mood and affect, speech fluent and appropriate Neurologic: grossly non-focal.          Labs on Admission:  Basic Metabolic Panel:  Recent Labs Lab 10/11/13 0451 10/11/13 0541 10/12/13 0550 10/13/13 0600 10/13/13 2029  NA 136*  --  136* 138 133*  K 3.9  --  3.9 3.5* 3.8  CL 99  --  101 101 96  CO2 21  --  25 27 23   GLUCOSE 83  --  147* 114* 113*  BUN 6  --  5* 9 8  CREATININE 0.77  --  0.73 0.72 0.65  CALCIUM 8.5  --  8.6 8.6 9.5  MG  --  1.6 1.5  --   --   PHOS  --  3.2 2.8  --   --    Liver Function Tests:  Recent Labs Lab 10/11/13 0451 10/12/13 0550 10/13/13 2029  AST 18 13 52*  ALT 48* 35 70*  ALKPHOS 140* 121* 161*  BILITOT 0.3 <0.2* 0.4  PROT 6.5 6.0 7.8  ALBUMIN 2.9* 2.7* 3.6    Recent Labs Lab 10/13/13 2029  LIPASE 54   No results found for this basename: AMMONIA,  in the last 168 hours CBC:  Recent Labs Lab 10/11/13 0451 10/12/13 0550 10/13/13 2029  WBC 6.9 5.7 7.7  NEUTROABS  --  3.2 5.7  HGB 8.0* 7.6* 10.1*  HCT 25.5* 23.8* 31.3*  MCV 82.0 80.1 80.1  PLT 377 340 447*   Cardiac Enzymes:  Recent Labs Lab 10/09/13 2125  TROPONINI <0.30    BNP (last 3 results) No results found for this basename: PROBNP,  in the last 8760 hours CBG:  Recent Labs Lab 10/12/13 2023 10/13/13 0017  10/13/13 0407 10/13/13 0738 10/13/13 1135  GLUCAP 140* 143* 141* 132* 163*    Radiological Exams on Admission: Dg Abd Acute W/chest  10/13/2013   CLINICAL DATA:  Abdominal pain and vomiting. Discharged today from a 30 day hospital stay.  EXAM: ACUTE ABDOMEN SERIES (ABDOMEN 2 VIEW & CHEST 1 VIEW)  COMPARISON:  10/11/2013  FINDINGS: Right PICC catheter with tip over the cavoatrial junction. Slightly shallow inspiration. Fibrosis or atelectasis in the right lung base. No focal consolidation. No blunting of costophrenic angles. Normal heart size and pulmonary vascularity.  Postoperative changes in the left upper quadrant with gastrostomy tube present. Residual contrast material in the colon without distention. No small bowel distention. No free intra-abdominal air. No abnormal air-fluid levels. No radiopaque stones. Visualized bones appear intact.  IMPRESSION: Fibrosis or atelectasis in the right lung base. Nonobstructive bowel gas pattern. No free air. Residual contrast material in the colon.   Electronically Signed   By: Lucienne Capers M.D.   On: 10/13/2013 21:15     Assessment/Plan Active Problems:   Essential hypertension   Intractable vomiting   Gastric carcinoma s/p Distal gastrectomy/GJ (Billroth II) 09/20/2013   Malnutrition of moderate degree   1. Intractable vomiting -will be admitted for hydration -will give antiemetics as tolerated -advance diet as tolerated -will continue with reglan  2. Gastric Carcinoma -has not received any chemo as of yet -oncology consultation  3. Malnutrition -will try to advance diet as tolerated -have started on IVF -will get dietary involvement as needed  4. Hypertension -will continue with home medications  5. Headaches -she currently states that she has no headaches     Code Status: Full Code (must indicate code status--if unknown or must be presumed, indicate so) DVT Prophylaxis:Heparin Family Communication: Daughter in law  (indicate person spoken with, if applicable, with phone number if by telephone) Disposition Plan: Home (indicate anticipated LOS)  Time spent: 83min  Antha Niday A [facility] Pager [phone]  **Disclaimer: This note may have been dictated with voice recognition software. Similar sounding words can inadvertently be transcribed and this note may contain transcription errors which may not have been corrected upon publication of note.**

## 2013-10-13 NOTE — Progress Notes (Signed)
Patient's PICC line was flushed and capped before she was discharged. Patient felt nauseas at time of discharge and was dry heaving. Patient refused nausea medication (zofran). Patient stated she would take phenergan suppository once she got home. Educated patient on options of staying another night. Patient insisted she wanted to go home (she was alert and oriented x4). I reviewed discharge packet with patient, her son, and his significant other. They verbalized understanding of patient's medical care options, medication regimen, follow up appointments, and discharge instructions. Patient left with her belongings and prescriptions in hand.  I communicated throughout shift with home health nurse. Hulan Amato RN was aware that patient was being discharged and estimated time of discharge. I called home health to inform them when patient officially left hospital.

## 2013-10-13 NOTE — Progress Notes (Signed)
PT Cancellation Note  ___Treatment cancelled today due to medical issues with patient which prohibited therapy  ___ Treatment cancelled today due to patient receiving procedure or test   ___ Treatment cancelled today due to patient's refusal to participate   _X_ Treatment cancelled today due to pt request to rest. Pt plans to D/C to home today by car and wants to save her energy for that and shared she is "nervous about going home".  Rica Koyanagi  PTA [facility]  Acute  Rehab Pager      [phone]
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
## 2013-10-13 NOTE — ED Provider Notes (Signed)
CSN: [id number]     Arrival date & time 10/13/13  1905 History   First MD Initiated Contact with Patient 10/13/13 1943     Chief Complaint  Patient presents with  . Abdominal Pain     (Consider location/radiation/quality/duration/timing/severity/associated sxs/prior Treatment) Patient is a 48 y.o. female presenting with abdominal pain. The history is provided by the patient and medical records.  Abdominal Pain Associated symptoms: nausea and vomiting    This is a 48-year-old female with past medical history significant for hypertension, ovarian cyst, GERD, recently diagnosed stage III gastric cancer, presenting to the ED for abdominal pain, nausea, and vomiting. Patient was just discharged from the hospital earlier this morning after a 28 day stay.  She underwent EGD on 09/16/13 were 3 antral ulcers were discovered. 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 Patient then underwent a diagnostic laparoscopy with distal gastrectomy/Billroth II anastomosis and feeding G-tube on 09/20/13. Surgical margins were clear and 4/11 lymph nodes were positive.  She had some complications with postsurgical ileus and persistent nausea and vomiting. Daughter states she was discharged home this morning but was still having sx at time of discharge.  On review of medical notes, it was offered to delay d/c until sx fully resolved, however patient decided to go home.  Daughter states her home health nurse met them when she arrived home and was attempting to show family members have to give her pain medication through her PICC line and performs tube feedings, however she began vomiting again with severe pain.  No hematemesis.  No fevers today.  Per family patient has been unable to tube feedings today.  Past Medical History  Diagnosis Date  . Headache(784.0) 2011    Migraines  . Esophageal reflux     On omeprazole  . Costochondritis   . Hypertension     started around 2011   . Ovarian cyst    Past Surgical History  Procedure Laterality Date  . Oophorectomy      Around 1985 left ovary removal  . Tonsillectomy      Around 1994  . Tubal ligation    . Esophagogastroduodenoscopy N/A 09/16/2013    Procedure: ESOPHAGOGASTRODUODENOSCOPY (EGD);  Surgeon: Salem F Ganem, MD;  Location: [facility] ENDOSCOPY;  Service: Endoscopy;  Laterality: N/A;  . Laparoscopy N/A 09/20/2013    Procedure: DIAGNOSTIC LAPAROSCOPY,DISTAL GASTRECTOMY AND FEEDING GASTROJEJUNOSTOMY;  Surgeon: Faera Byerly, MD;  Location: [facility] ORS;  Service: General;  Laterality: N/A;   Family History  Problem Relation Age of Onset  . Hypertension Mother   . Diabetes Mellitus II Mother   . Hypertension Sister   . Hypertension Brother   . Heart failure Maternal Grandmother   . Hypertension Maternal Grandfather    History  Substance Use Topics  . Smoking status: Former Smoker  . Smokeless tobacco: Never Used     Comment: Stopped 2004  . Alcohol Use: Yes     Comment: occ   OB History   Grav Para Term Preterm Abortions TAB SAB Ect Mult Living                 Review of Systems  Gastrointestinal: Positive for nausea, vomiting and abdominal pain.  All other systems reviewed and are negative.     Allergies  Review of patient's allergies indicates no known allergies.  Home Medications   Prior to Admission medications   Medication Sig Start Date End Date Taking? Authorizing Provider  carvedilol (COREG) 6.25 MG tablet Take 6.25 mg   by mouth 2 (two) times daily with a meal.    Historical Provider, MD  fentaNYL (DURAGESIC - DOSED MCG/HR) 25 MCG/HR patch Place 1 patch (25 mcg total) onto the skin every 3 (three) days. 10/13/13   Christina P Rama, MD  FLUoxetine (PROZAC) 20 MG capsule Take 1 capsule (20 mg total) by mouth daily. 08/15/13   Shane R Hudnall, MD  metoCLOPramide (REGLAN) 5 MG tablet Take 1 tablet (5 mg total) by mouth 4 (four) times daily -  before meals and at bedtime. 10/13/13   Christina P Rama, MD   Morphine Sulfate (MORPHINE CONCENTRATE) 10 mg / 0.5 ml concentrated solution Take 0.5 mLs (10 mg total) by mouth every 3 (three) hours as needed for severe pain. 10/13/13   Christina P Rama, MD  omeprazole (PRILOSEC) 20 MG capsule Take 1 capsule (20 mg total) by mouth 2 (two) times daily. 09/13/13   John David Wofford III, MD  ondansetron (ZOFRAN ODT) 4 MG disintegrating tablet Take 1 tablet (4 mg total) by mouth every 8 (eight) hours as needed for nausea or vomiting. 10/13/13   Christina P Rama, MD  promethazine (PHENERGAN) 25 MG suppository Place 1 suppository (25 mg total) rectally every 6 (six) hours as needed for nausea, vomiting or refractory nausea / vomiting. 10/13/13   Christina P Rama, MD   BP 152/96  Pulse 120  Temp(Src) 99.8 F (37.7 C) (Oral)  Resp 18  SpO2 99%  LMP 09/12/2013  Physical Exam  Nursing note and vitals reviewed. Constitutional: She is oriented to person, place, and time. She appears well-developed and well-nourished. No distress.  Rocking back and forth in bed, moaning  HENT:  Head: Normocephalic and atraumatic.  Mouth/Throat: Oropharynx is clear and moist.  Dry mucous membranes  Eyes: Conjunctivae and EOM are normal. Pupils are equal, round, and reactive to light.  Neck: Normal range of motion. Neck supple.  Cardiovascular: Regular rhythm and normal heart sounds.  Tachycardia present.   Pulmonary/Chest: Effort normal and breath sounds normal. No respiratory distress. She has no wheezes.  Abdominal: Soft. Bowel sounds are normal. There is tenderness. There is guarding.  Abdomen soft, non-distended, generalized tenderness PEG tube in place  Musculoskeletal: Normal range of motion. She exhibits no edema.  PICC line RUE, site appears clean  Neurological: She is alert and oriented to person, place, and time.  Skin: Skin is warm and dry. She is not diaphoretic.  Psychiatric: She has a normal mood and affect.    ED Course  Procedures (including critical care  time) Labs Review Labs Reviewed  CBC WITH DIFFERENTIAL - Abnormal; Notable for the following:    Hemoglobin 10.1 (*)    HCT 31.3 (*)    MCH 25.8 (*)    Platelets 447 (*)    All other components within normal limits  COMPREHENSIVE METABOLIC PANEL - Abnormal; Notable for the following:    Sodium 133 (*)    Glucose, Bld 113 (*)    AST 52 (*)    ALT 70 (*)    Alkaline Phosphatase 161 (*)    All other components within normal limits  LIPASE, BLOOD  I-STAT TROPOININ, ED    Imaging Review Dg Abd Acute W/chest  10/13/2013   CLINICAL DATA:  Abdominal pain and vomiting. Discharged today from a 30 day hospital stay.  EXAM: ACUTE ABDOMEN SERIES (ABDOMEN 2 VIEW & CHEST 1 VIEW)  COMPARISON:  10/11/2013  FINDINGS: Right PICC catheter with tip over the cavoatrial junction. Slightly shallow inspiration. Fibrosis   or atelectasis in the right lung base. No focal consolidation. No blunting of costophrenic angles. Normal heart size and pulmonary vascularity.  Postoperative changes in the left upper quadrant with gastrostomy tube present. Residual contrast material in the colon without distention. No small bowel distention. No free intra-abdominal air. No abnormal air-fluid levels. No radiopaque stones. Visualized bones appear intact.  IMPRESSION: Fibrosis or atelectasis in the right lung base. Nonobstructive bowel gas pattern. No free air. Residual contrast material in the colon.   Electronically Signed   By: William  Stevens M.D.   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 She was released from the hospital just a few hours ago. On exam, she does appear uncomfortable and is moaning loudly in pain. She is actively dry heaving and appears dehydrated.  Will obtain basic labs and acute abdominal series as patient did have post-op ileus while in hospital, recent films 2 days were normal.  IVFB, dilaudid, and zofran given. Currently tachycardic, rate 122.  Lab work is reassuring.  Acute abdominal series without evidence of bowel obstruction or perforation. After medication and fluids, patient's is feeling better however she has a persistent low-grade tachycardia at rate of 107.  Additional fluids ordered.  Patient states she continues to feel weak and is hesitant to return home.  Will re-admit to hospitalist service for further sx control.   M , PA-C 10/14/13 0002 

## 2013-10-13 NOTE — Progress Notes (Signed)
CARE MANAGEMENT NOTE 10/13/2013  Patient:  Paula [name], Paula [name]   Account Number:  [id number]  Date Initiated:  09/16/2013  Documentation initiated by:  Dessa Phi  Subjective/Objective Assessment:   48 Y/O F ADMITTED W/VHEST PAIN,N/V.     Action/Plan:   FROM HOME.   Anticipated DC Date:  09/18/2013   Anticipated DC Plan:  [place]  CM consult      Choice offered to / List presented to:  C-1 Patient   DME arranged  [facility] arranged  HH-1 RN  [place] OT      [place].   Status of service:  Completed, signed off Medicare Important Message given?   (If response is "NO", the following Medicare IM given date fields will be blank) Date Medicare IM given:   Medicare IM given by:   Date Additional Medicare IM given:   Additional Medicare IM given by:    Discharge Disposition:  [place]  Per UR Regulation:  Reviewed for med. necessity/level of care/duration of stay  If discussed at [place] of Stay Meetings, dates discussed:    Comments:  10/13/2013 1255 NCM spoke to pt and she is agreeable to dc today. Gave permission to speak to sons, Barbee Shropshire # [phone]. Spoke to son, Keturah Barre with updates. Jonnie Finner RN CCM Case Mgmt phone [phone]  10/13/2013 1000 NCM spoke to [facility] RN scheduled to come today to set up TPN. Jonnie Finner RN CCM Case Mgmt phone [phone]  10/12/2013 1100 NCM spoke to son, Keturah Barre # [phone]. States they have adequate caregivers at home to assist pt once dc. Son states they have received DME from [facility]. NCM contacted [facility] and they can do soc 10/13/2013 for [facility] RN to set up TPN. Jonnie Finner RN CCM Case Mgmt phone [phone]  10/08/13 12:30 CM met with pt in room to verify address and contact number.  Pt states she will be going  to [address] [place] [address] and the best number is [phone].  CM called [facility] rep Kristen to add for HHPT/OT/RN/aide/SW.  CM called [facility] DME rep, Lucretia with address change.  Jeanella Anton will make arrangements with pt for DME delivery.  Request made of discharging MD to please note probable cause of emesis as this pt is high risk of returning; pt has been counseled as to the mechanics of her eating and small amounts, more frequently was encouraged. HHSW and HHRN in place to provide support and resources for pt post discharge.  No other CM needs were communicated. Mariane Masters, BSN, Jearld Lesch [phone].   10/04/13 10:15 CM met with pt in room who has been refusing PT/OT eval; CM explained the importance in having that eval to see what disciplines she will need to go home and discharge is planned for this weekend.  Pt agrees to work with PT/OT today; confirms she wants [facility] to render ordered services and requests [name], HHPT/OT/aide.  CM requests orders from MD for DME and HH. CM notified RN pt willing to work with PT/OT eval.  Waiting for orders.  Mariane Masters, BSN, Jearld Lesch [phone].  09/23/13 MMcGibboney, RN, BSN Pt selected [facility] for [facility] and DME.  Referral given to in house  rep.   09/16/13 KATHY MAHABIR RN,BSN NCM [phone] EGD TODAY.   

## 2013-10-13 NOTE — Discharge Instructions (Signed)
Diet Following Bariatric Surgery The bariatric diet is designed to provide fluids and nourishment while promoting weight loss and healing after bariatric surgery. The diet is divided into 3 stages. Progress to each stage of the diet with your health care provider's approval.  WHAT DO I NEED TO KNOW ABOUT MY DIET FOLLOWING BARIATRIC SURGERY? Your surgeon may have individual guidelines for you about specific foods or the progression of your diet. Follow your surgeon's guidelines. You will follow these general guidelines during all stages of your diet:  Eat at set times.  Allow 30-45 minutes for each meal.  Take small bites. Chew your food until it is almost a liquid before swallowing it. Try setting down your utensils between bites to help yourself eat slower or make an "eat slowly" reminder sign.  Do not drink liquids for 30 minutes before meals and for 30 minutes after meals.  Drink between meals.  Stop eating when you are full. If you feel tightness or pressure in your chest, that means you are full. Wait 30 minutes before you try to eat again.  Take a chewable multivitamin daily in addition to other supplements as directed by your health care provider.  Sip at least 48-64 oz of liquid, preferably water, each day.  Stay away from concentrated sweets with more than 10 g of sugar per serving.  Protein is a very important part of the diet. Have protein with every meal when possible. Try eating your protein food first.  Full Liquid Diet A full liquid diet may be used:   To help you transition from a clear liquid diet to a soft diet.   When your body is healing and can only tolerate foods that are easy to digest.  Before or after certain a procedure, test, or surgery (such as stomach or intestinal surgeries).   If you have trouble swallowing or chewing.  A full liquid diet includes fluids and foods that are liquid or will become liquid at room temperature. The full liquid diet gives  you the proteins, fluids, salts, and minerals that you need for energy. If you continue this diet for more than 72 hours, talk to your health care provider about how many calories you need to consume. If you continue the diet for more than 5 days, talk to your health care provider about taking a multivitamin or a nutritional supplement. WHAT DO I NEED TO KNOW ABOUT A FULL LIQUID DIET?  You may have any liquid.  You may have any food that becomes a liquid at room temperature. The food is considered a liquid if it can be poured off a spoon at room temperature.  Drink one serving of citrus or vitamin C-enriched fruit juice daily. WHAT FOODS CAN I EAT? Grains Any grain food that can be pureed in soup (such as crackers, pasta, and rice). Hot cereal (such as farina or oatmeal) that has been blended. Talk to your health care provider or dietitian about these foods. Vegetables Pulp-free tomato or vegetable juice. Vegetables pureed in soup.  Fruits Fruit juice, including nectars and juices with pulp. Meats and Other Protein Sources Eggs in custard, eggnog mix, and eggs used in ice cream or pudding. Strained meats, like in baby food, may be allowed. Consult your health care provider.  Dairy Milk and milk-based beverages, including milk shakes and instant breakfast mixes. Smooth yogurt. Pureed cottage cheese. Avoid these foods if they are not well tolerated. Beverages All beverages, including liquid nutritional supplements. Ask your health care provider  if you can have carbonated beverages. They may not be well tolerated. Condiments Iodized salt, pepper, spices, and flavorings. Cocoa powder. Vinegar, ketchup, yellow mustard, smooth sauces (such as hollandaise, cheese sauce, or white sauce), and soy sauce. Sweets and Desserts Custard, smooth pudding. Flavored gelatin. Tapioca, junket. Plain ice cream, sherbet, fruit ices. Frozen ice pops, frozen fudge pops, pudding pops, and other frozen bars with  cream. Syrups, including chocolate syrup. Sugar, honey, jelly.  Fats and Oils Margarine, butter, cream, sour cream, and oils. Other Broth and cream soups. Strained, broth-based soups. The items listed above may not be a complete list of recommended foods or beverages. Contact your dietitian for more options.  WHAT FOODS CAN I NOT EAT? Grains All breads. Grains are not allowed unless they are pureed into soup. Vegetables Vegetables are not allowed unless they are juiced, or cooked and pureed into soup. Fruits Fruits are not allowed unless they are juiced. Meats and Other Protein Sources Any meat or fish. Cooked or raw eggs. Nut butters.  Dairy Cheese.  Condiments Stone ground mustards. Fats and Oils Fats that are coarse or chunky. Sweets and Desserts Ice cream or other frozen desserts that have any solids in them or on top, such as nuts, chocolate chips, and pieces of cookies. Cakes. Cookies. Candy. Others Soups with chunks or pieces in them. The items listed above may not be a complete list of foods and beverages to avoid. Contact your dietitian for more information. Document Released: 02/07/2005 Document Revised: 02/12/2013 Document Reviewed: 12/13/2012 [facility] Patient Information 2015 [place], [place]. This information is not intended to replace advice given to you by your health care provider. Make sure you discuss any questions you have with your health care provider.  Dysphagia Level 1 Diet, Pureed The dysphasia level 1 diet includes foods that are completely pureed and smooth. The foods have a pudding-like texture, such as the texture of pureed pancakes, mashed potatoes, and yogurt. The diet does not include foods with lumps or coarse textures. Liquids should be smooth and may either be thin, nectar-thick, honey-like, or spoon-thick. This diet is helpful for people with moderate to severe swallowing problems. It reduces the risk of food getting caught in the windpipe, trachea, or  lungs. You may need help or supervision during meals while following this diet. WHAT DO I NEED TO KNOW ABOUT THIS DIET? Foods  You may eat foods that are soft and have a pudding-like texture. If a food does not have this texture, you may be able to eat the food after:  Pureeing it. This can be done with a blender or whisk.  Moistening it with liquid. For example, you may have bread if you soak it in milk or syrup.  Avoid foods that are hard, dry, sticky, chunky, lumpy, or stringy. Also avoid foods with nuts, seeds, raisins, skins, and pulp.  Do not eat foods that you have to chew. If you have to chew the food, then you cannot eat it.  Eat a variety of foods to get all the nutrients you need. Liquids  You may drink liquids that are smooth. Your health care provider will tell you if you should drink thin or thickened liquids.  To thicken a liquid, use a food and beverage thickener or a thickening food. Thickened liquids are usually a "pudding-like" consistency.  Thin liquids include fruit juices, milk, coffee, tea, yogurts, shakes, and similar foods that melt to thin liquid at room temperature.  Avoid liquids with seeds, pulp, or chunks. See  your dietitian or health care provider regularly for help with your dietary changes. WHAT FOODS CAN I EAT? Grains Store-bought soft breads, pancakes, and [place] toast that have a smooth, moist texture and do not have nuts or seeds (you will need to moisten the food with liquid). Cooked cereals that have a pudding-like consistency, such as cream of wheat or farina (no oatmeal). Pureed, well-cooked pasta, rice, and plain bread stuffing. Vegetables Pureed vegetables. Soft avocado. Smooth tomato paste or sauce. Strained or pureed soups (these may need to be thickened as directed). Mashed or pureed potatoes without skin (can be seasoned with butter, smooth gravy, margarine, or sour cream). Fruits Pureed fruits such as melons and apples without seeds or  pulp. Mashed bananas. Smooth tomato paste or sauce. Fruit juices without pulp or seeds. Strained or pureed soups. Meat and Other Protein Sources Pureed meat. Smooth pate or liverwurst. Smooth souffles. Pureed beans (such as lentils). Pureed eggs. Dairy Yogurt. Smooth cheese sauces. Milk (may need to be thickened). Nutritional dairy drinks or shakes. Ask your health care provider whether you can have ice cream. Condiments Finely ground salt, pepper, and other ground spices. Sweets/Desserts Smooth puddings and custards. Pureed desserts. Souffles. Whipped topping. Ask your health care provider whether you can have frozen desserts. Fats and Oils Butter. Margarine. Smooth and strained gravy. Sour cream. Mayonnaise. Cream cheese. Whipped topping. Smooth sauces (such as white sauce, cheese sauce, or hollandaise sauce). The items listed above may not be a complete list of recommended foods or beverages. Contact your dietitian for more options. WHAT FOODS ARE NOT RECOMMENDED? Grains Oatmeal. Dry cereals. Hard breads. Vegetables Whole vegetables. Stringy vegetables (such as celery). Thin tomato sauce. Fruits Whole fresh, frozen, canned, or dried fruits that have not been pureed. Stringy fruits (such as pineapple). Meat and Other Protein Sources Whole or ground meat, fish, or poultry. Dried or cooked lentils or legumes that have been cooked but not mashed or pureed. Non-pureed eggs. Nuts and seeds. Peanut butter. Dairy Non-pureed cheese. Dairy products with lumps or chunks. Ask your health care provider whether you can have ice cream. Condiments Coarse or seeded herbs and spices. Sweets/Desserts [place] preserves. Jams with seeds. Solid desserts. Sticky, chewy sweets (such as licorice and caramel). Ask your health care provider whether you can have frozen desserts. Fats and Oils Sauces of fats with lumps or chunks. The items listed above may not be a complete list of foods and beverages to avoid.  Contact your dietitian for more information. Document Released: 02/07/2005 Document Revised: 06/24/2013 Document Reviewed: 01/21/2013 [facility] Patient Information 2015 [place], [place]. This information is not intended to replace advice given to you by your health care provider. Make sure you discuss any questions you have with your health care provider.  [facility], [place] [phone]  OPEN ABDOMINAL SURGERY: POST OP INSTRUCTIONS  Always review your discharge instruction sheet given to you by the facility where your surgery was performed.  IF YOU HAVE DISABILITY OR FAMILY LEAVE FORMS, YOU MUST BRING THEM TO THE OFFICE FOR PROCESSING.  PLEASE DO NOT GIVE THEM TO YOUR DOCTOR.  1. A prescription for pain medication may be given to you upon discharge.  Take your pain medication as prescribed, if needed.  If narcotic pain medicine is not needed, then you may take acetaminophen (Tylenol) or ibuprofen (Advil) as needed. 2. Take your usually prescribed medications unless otherwise directed. 3. If you need a refill on your pain medication, please contact your pharmacy.  They will contact our office to request authorization.  Prescriptions will not be filled after 5pm or on week-ends. 4. You should follow a light diet the first few days after arrival home, such as soup and crackers, pudding, etc.unless your doctor has advised otherwise. A high-fiber, low fat diet can be resumed as tolerated.   Be sure to include lots of fluids daily. Most patients will experience some swelling and bruising on the chest and neck area.  Ice packs will help.  Swelling and bruising can take several days to resolve 5. Most patients will experience some swelling and bruising in the area of the incision. Ice pack will help. Swelling and bruising can take several days to resolve..  6. It is common to experience some constipation if taking pain medication after surgery.  Increasing fluid intake and taking a stool  softener will usually help or prevent this problem from occurring.  A mild laxative (Milk of Magnesia or Miralax) should be taken according to package directions if there are no bowel movements after 48 hours. 7.  You may have steri-strips (small skin tapes) in place directly over the incision.  These strips should be left on the skin for 7-10 days.  If your surgeon used skin glue on the incision, you may shower in 24 hours.  The glue will flake off over the next 2-3 weeks.  Any sutures or staples will be removed at the office during your follow-up visit. You may find that a light gauze bandage over your incision may keep your staples from being rubbed or pulled. You may shower and replace the bandage daily. 8. ACTIVITIES:  You may resume regular (light) daily activities beginning the next day--such as daily self-care, walking, climbing stairs--gradually increasing activities as tolerated.  You may have sexual intercourse when it is comfortable.  Refrain from any heavy lifting or straining until approved by your doctor. a. You may drive when you no longer are taking prescription pain medication, you can comfortably wear a seatbelt, and you can safely maneuver your car and apply brakes b. Return to Work: ___________________________________ 40. You should see your doctor in the office for a follow-up appointment approximately two weeks after your surgery.  Make sure that you call for this appointment within a day or two after you arrive home to insure a convenient appointment time. OTHER INSTRUCTIONS:  _____________________________________________________________ _____________________________________________________________  WHEN TO CALL YOUR DOCTOR: 1. Fever over 101.0 2. Inability to urinate 3. Nausea and/or vomiting 4. Extreme swelling or bruising 5. Continued bleeding from incision. 6. Increased pain, redness, or drainage from the incision. 7. Difficulty swallowing or breathing 8. Muscle cramping  or spasms. 9. Numbness or tingling in hands or feet or around lips.  The clinic staff is available to answer your questions during regular business hours.  Please dont hesitate to call and ask to speak to one of the nurses if you have concerns.  For further questions, please visit [email or web address]   Cyclic Vomiting Syndrome Cyclic vomiting syndrome is a benign condition in which patients experience bouts or cycles of severe nausea and vomiting that last for hours or even days. The bouts of nausea and vomiting alternate with longer periods of no symptoms and generally good health. Cyclic vomiting syndrome occurs mostly in children, but can affect adults. CAUSES  [facility] has no known cause. Each episode is typically similar to the previous ones. The episodes tend to:   Start at about the same time of day.  Last the same length  of time.  Present the same symptoms at the same level of intensity. Cyclic vomiting syndrome can begin at any age in children and adults. Cyclic vomiting syndrome usually starts between the ages of 3 and 7 years. In adults, episodes tend to occur less often than they do in children, but they last longer. Furthermore, the events or situations that trigger episodes in adults cannot always be pinpointed as easily as they can in children. There are 4 phases of cyclic vomiting syndrome: 1. Prodrome. The prodrome phase signals that an episode of nausea and vomiting is about to begin. This phase can last from just a few minutes to several hours. This phase is often marked by belly (abdominal) pain. Sometimes taking medicine early in the prodrome phase can stop an episode in progress. However, sometimes there is no warning. A person may simply wake up in the middle of the night or early morning and begin vomiting. 2. Episode. The episode phase consists of:  Severe vomiting.  Nausea.  Gagging (retching). 3. Recovery. The recovery phase begins when the nausea and  vomiting stop. Healthy color, appetite, and energy return. 4. Symptom-free interval. The symptom-free interval phase is the period between episodes when no symptoms are present. TRIGGERS Episodes can be triggered by an infection or event. Examples of triggers include:  Infections.  Colds, allergies, sinus problems, and the flu.  Eating certain foods such as chocolate or cheese.  Foods with monosodium glutamate (MSG) or preservatives.  Fast foods.  Pre-packaged foods.  Foods with low nutritional value (junk foods).  Overeating.  Eating just before going to bed.  Hot weather.  Dehydration.  Not enough sleep or poor sleep quality.  Physical exhaustion.  Menstruation.  Motion sickness.  Emotional stress (school or home difficulties).  Excitement or stress. SYMPTOMS  The main symptoms of cyclic vomiting syndrome are:  Severe vomiting.  Nausea.  Gagging (retching). Episodes usually begin at night or the first thing in the morning. Episodes may include vomiting or retching up to 5 or 6 times an hour during the worst of the episode. Episodes usually last anywhere from 1 to 4 days. Episodes can last for up to 10 days. Other symptoms include:  Paleness.  Exhaustion.  Listlessness.  Abdominal pain.  Loose stools or diarrhea. Sometimes the nausea and vomiting are so severe that a person appears to be almost unconscious. Sensitivity to light, headache, fever, dizziness, may also accompany an episode. In addition, the vomiting may cause drooling and excessive thirst. Drinking water usually leads to more vomiting, though the water can dilute the acid in the vomit, making the episode a little less painful. Continuous vomiting can lead to dehydration, which means that the body has lost excessive water and salts. DIAGNOSIS  Cyclic vomiting syndrome is hard to diagnose because there are no clear tests to identify it. A caregiver must diagnose cyclic vomiting syndrome by  looking at symptoms and medical history. A caregiver must exclude more common diseases or disorders that can also cause nausea and vomiting. Also, diagnosis takes time because caregivers need to identify a pattern or cycle to the vomiting. TREATMENT  Cyclic vomiting syndrome cannot be cured. Treatment varies, but people with cyclic vomiting syndrome should get plenty of rest and sleep and take medications that prevent, stop, or lessen the vomiting episodes and other symptoms. People whose episodes are frequent and long-lasting may be treated during the symptom-free intervals in an effort to prevent or ease future episodes. The symptom-free phase is a good  time to eliminate anything known to trigger an episode. For example, if episodes are brought on by stress or excitement, this period is the time to find ways to reduce stress and stay calm. If sinus problems or allergies cause episodes, those conditions should be treated. The triggers listed above should be avoided or prevented. Because of the similarities between migraine and cyclic vomiting syndrome, caregivers treat some people with severe cyclic vomiting syndrome with drugs that are also used for migraine headaches. The drugs are designed to:  Prevent episodes.  Reduce their frequency.  Lessen their severity. HOME CARE INSTRUCTIONS Once a vomiting episode begins, treatment is supportive. It helps to stay in bed and sleep in a dark, quiet room. Severe nausea and vomiting may require hospitalization and intravenous (IV) fluids to prevent dehydration. Relaxing medications (sedatives) may help if the nausea continues. Sometimes, during the prodrome phase, it is possible to stop an episode from happening altogether. Only take over-the-counter or prescription medicines for pain, discomfort or fever as directed by your caregiver. Do not give aspirin to children. During the recovery phase, drinking water and replacing lost electrolytes (salts in the blood)  are very important. Electrolytes are salts that the body needs to function well and stay healthy. Symptoms during the recovery phase can vary. Some people find that their appetites return to normal immediately, while others need to begin by drinking clear liquids and then move slowly to solid food. RELATED COMPLICATIONS The severe vomiting that defines cyclic vomiting syndrome is a risk factor for several complications:  Dehydration--Vomiting causes the body to lose water quickly.  Electrolyte imbalance--Vomiting also causes the body to lose the important salts it needs to keep working properly.  Peptic esophagitis--The tube that connects the mouth to the stomach (esophagus) becomes injured from the stomach acid that comes up with the vomit.  Hematemesis--The esophagus becomes irritated and bleeds, so blood mixes with the vomit.  Mallory-Weiss tear--The lower end of the esophagus may tear open or the stomach may bruise from vomiting or retching.  Tooth decay--The acid in the vomit can hurt the teeth by corroding the tooth enamel. SEEK MEDICAL CARE IF: You have questions or problems. Document Released: 04/18/2001 Document Revised: 05/02/2011 Document Reviewed: 05/17/2010 [facility] Patient Information 2015 [place], [place]. This information is not intended to replace advice given to you by your health care provider. Make sure you discuss any questions you have with your health care provider.  Gastric Cancer Gastric cancer is a tumor which starts as a growth in your stomach. Cancer is a group of many related diseases that begin in cells, the building blocks of the body. Normally, cells grow and divide to produce more cells only when the body needs them. Sometimes, cells keep dividing when new cells are not needed. These extra cells may form a mass of tissue called a growth or tumor. Tumors can be either benign (not cancerous) or malignant (cancerous). Cancer can begin in any organ or tissue of the  body. The original tumor (where the tumor started out) is called the primary cancer and is usually named for where it begins.  Several types of cancer can occur in the stomach. Adenocarcinoma is the most common, accounting for about 95% of gastric tumors. Other cancer types include carcinoid tumors, lymphoma, or gastrointestinal stromal cell tumors (GISTs).  CAUSES  Though the exact cause of gastric cancer is not known, there are several known risk factors:  Age over 16.  Female sex.  Race: more common in [place],  Pacific Islander, Hispanic, and African American people.  Diet high in smoked, salted, or pickled foods.  Tobacco and alcohol use.  History of stomach surgery, chronic gastritis, gastric polyps, or pernicious anemia.  Stomach infection with H. pylori bacteria (which also increases risk for ulcers).  Genetic factors including family history and blood type A. Note that very few people with risk factors actually develop gastric cancer. SYMPTOMS   Pain.  Loss of appetite.  Problems swallowing.  Nausea and vomiting.  Vomiting blood.  Abdominal pain.  Excessive gas or belching.  Weight loss.  General health problems. DIAGNOSIS  Your caregiver may suspect gastric cancer based on your symptoms and your physical exam. Further testing can diagnose gastric cancer. This may include looking for blood in your stool. Gastroscopy (looking at your stomach through an instrument like a thin flexible telescope; also called endoscopy) may also be done. Biopsies can be done if an abnormal growth is found. This is the removal of a small piece of tissue from your stomach if your caregiver notices abnormalities or growths there. The biopsy is looked at under a microscope by a specialist who can tell if cancer is present. If cancer is confirmed, other tests may be needed to see if the cancer has spread beyond the stomach. TREATMENT   Surgical removal of the stomach (gastrectomy) is the only  curative treatment. Sometimes only part of the stomach needs to be removed, depending on location of the cancer. Gastrectomy can be done if the cancer is found before it has spread beyond the stomach.  Radiation therapy and chemotherapy may be helpful. Chemotherapy and radiation therapy given after surgery may improve cure rates or make you feel better.  Antibiotics are sometimes used for H. pylori infection.  Advanced techniques to remove or destroy cancer without surgery are being researched.  Feeding tubes or bypass surgery may help if food becomes blocked. The possibility of curing your gastric cancer depends on the type of tumor, the location of the tumor, and whether the tumor has spread beyond the stomach. If your cancer cannot be cured, treatment may slow the progression of the disease. Treatments are also available to address pain or other symptoms of cancer. HOME CARE INSTRUCTIONS   Your caregiver may prescribe a specific type of diet. If you have had surgery, then a dietitian may help you with an eating plan. Avoid red meats, processed meats, and salty, smoked, or pickled foods.  Take any prescribed medications as directed. Do not use more pain medication than directed.  You do not need to limit your activity unless instructed by your caregiver.  Avoid alcohol and tobacco use.  Keep appointments for tests with your caregiver or specialists. SEEK MEDICAL CARE IF:   You have problems eating.  You have problems tolerating your medications.  You continue to lose weight despite your treatments. SEEK IMMEDIATE MEDICAL CARE IF:   You have uncontrolled nausea, vomiting, or diarrhea.  You have vomiting with blood or coffee-grounds-type material.  You have had chemotherapy and you have a fever.  You have uncontrolled pain. Document Released: 11/12/2003 Document Revised: 06/24/2013 Document Reviewed: 02/19/2008 [facility] Patient Information 2015 [place], [place]. This information  is not intended to replace advice given to you by your health care provider. Make sure you discuss any questions you have with your health care provider.

## 2013-10-14 DIAGNOSIS — C169 Malignant neoplasm of stomach, unspecified: Secondary | ICD-10-CM

## 2013-10-14 DIAGNOSIS — I10 Essential (primary) hypertension: Secondary | ICD-10-CM

## 2013-10-14 DIAGNOSIS — K257 Chronic gastric ulcer without hemorrhage or perforation: Secondary | ICD-10-CM

## 2013-10-14 LAB — COMPREHENSIVE METABOLIC PANEL
ALT: 58 U/L — ABNORMAL HIGH (ref 0–35)
AST: 41 U/L — AB (ref 0–37)
Albumin: 2.7 g/dL — ABNORMAL LOW (ref 3.5–5.2)
Alkaline Phosphatase: 123 U/L — ABNORMAL HIGH (ref 39–117)
Anion gap: 10 (ref 5–15)
BUN: 7 mg/dL (ref 6–23)
CALCIUM: 8.6 mg/dL (ref 8.4–10.5)
CO2: 25 mEq/L (ref 19–32)
Chloride: 102 mEq/L (ref 96–112)
Creatinine, Ser: 0.72 mg/dL (ref 0.50–1.10)
GFR calc non Af Amer: >90 mL/min (ref >90)
GLUCOSE: 110 mg/dL — AB (ref 70–99)
Potassium: 3.7 mEq/L (ref 3.7–5.3)
Sodium: 137 mEq/L (ref 137–147)
TOTAL PROTEIN: 6 g/dL (ref 6.0–8.3)
Total Bilirubin: 0.3 mg/dL (ref 0.3–1.2)

## 2013-10-14 LAB — CBC
HCT: 25 % — ABNORMAL LOW (ref 36.0–46.0)
HEMOGLOBIN: 8.1 g/dL — AB (ref 12.0–15.0)
MCH: 25.7 pg — AB (ref 26.0–34.0)
MCHC: 32.4 g/dL (ref 30.0–36.0)
MCV: 79.4 fL (ref 78.0–100.0)
Platelets: 317 10*3/uL (ref 150–400)
RBC: 3.15 MIL/uL — AB (ref 3.87–5.11)
RDW: 15 % (ref 11.5–15.5)
WBC: 6.3 10*3/uL (ref 4.0–10.5)

## 2013-10-14 MED ORDER — UNJURY CHOCOLATE CLASSIC POWDER
1.0000 | Freq: Two times a day (BID) | ORAL | Status: DC
  Administered 2013-10-14 – 2013-10-16 (×4): 1 via ORAL
  Filled 2013-10-14 (×8): qty 27

## 2013-10-14 MED ORDER — METOCLOPRAMIDE HCL 5 MG/ML IJ SOLN
5.0000 mg | Freq: Three times a day (TID) | INTRAMUSCULAR | Status: DC
  Administered 2013-10-14 – 2013-10-17 (×8): 5 mg via INTRAVENOUS
  Filled 2013-10-14 (×10): qty 1

## 2013-10-14 MED ORDER — PANTOPRAZOLE SODIUM 40 MG IV SOLR
40.0000 mg | Freq: Once | INTRAVENOUS | Status: AC
  Administered 2013-10-14: 40 mg via INTRAVENOUS
  Filled 2013-10-14: qty 40

## 2013-10-14 MED ORDER — LORAZEPAM 1 MG PO TABS: 1.0000 mg | ORAL_TABLET | Freq: Once | ORAL | Status: DC

## 2013-10-14 NOTE — Progress Notes (Signed)
[facility]  Patient Status: Active pt for [facility] prior to this readmission.  Pt was admitted to [facility] home infusion/home care services on 10/13/13 upon DC home from [facility] is providing the following services: HHRN and [facility] for home TPN.  [facility] hospital team will follow pt and support DC home when deemed appropriate.  If patient discharges after hours, please call [phone].   Larry Sierras 10/14/2013, 2:02 PM

## 2013-10-14 NOTE — Progress Notes (Signed)
Patient ID: Paula [name], female   DOB: 1965-09-29, 48 y.o.   MRN: [id number] [facility] PROGRESS NOTE  Paula [name] INO:[id number] DOB: 01-Jan-1966 DOA: 11-05-13 PCP: Arnoldo Morale, MD  Brief narrative: 48 y.o. female with recent diagnosis of poorly differentiated gastric adenocarcinoma, prolonged hospital course due to this recently diagnosed gastric cancer who presented  to [facility] 11/05/2013 with abdominal pain, nausea and vomiting. She was on TNA at home but on admission TNA not started. She requested to regular diet today which she tolerated well.  Assessment/Plan:    Principal problem:  Abdominal pain, nausea and vomiting in the setting of poorly differentiated gastric adenocarcinoma and antral ulcer  No evidence of obstruction on abdominal x ray.  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 Recent procedures: laparoscopy and distal gastrectomy done on 09/20/13 Pt has PICC line which was placed for TNA nutritional support but pt wanted to try regular diet this am. She tolerated it well. We will continue Reglan 5 mg IV TID AC, protonix 40 mg PO daily. Continue pain management efforts with fentanyl patch 25 mcg q 72 hours, dilaudid 1 mg every 4 hours IV PRN severe pain and oxycodone 5 mg PO PRN moderate pain.  Active problems  Essential hypertension and tachycardia   Continue coreg 6.25 mg PO BID. Anemia of chronic disease  Secondary to gastric cancer  Hemoglobin drop noted from 10.1 to 8.1.  No signs of bleed.  No current indications for transfusion.  Protein calorie malnutrition moderate  TNA not resumed as she seems to tolerate regular diet. Depression  Continue Prozac 20 mg daily DVT Prophylaxis   Heparin sub Q while pt is in hospital.  Code Status: Full.  Family Communication:  plan of care  discussed with the patient Disposition Plan: Home when stable.    IV Access:   PICC line  Procedures and diagnostic studies:    Dg Abd Acute W/chest Nov 05, 2013    Fibrosis or atelectasis in the right lung base. Nonobstructive bowel gas pattern. No free air. Residual contrast material in the colon.    Medical Consultants:   None  Other Consultants:   None  Anti-Infectives:   None    Leisa Lenz, MD  [facility] Pager [phone]  If 7PM-7AM, please contact night-coverage [email or web address] Password [facility] 10/14/2013, 6:34 PM   LOS: 1 day    HPI/Subjective: No acute overnight events.  Objective: Filed Vitals:   10/14/13 0012 10/14/13 0013 10/14/13 0541 10/14/13 1538  BP: 116/79 110/78 102/56 117/67  Pulse:  115 99 89  Temp:   98.7 F (37.1 C) 98.6 F (37 C)  TempSrc:   Oral Oral  Resp:   16 17  Height:  5' (1.524 m)    Weight:  66.3 kg (146 lb 2.6 oz)    SpO2:   99% 100%    Intake/Output Summary (Last 24 hours) at 10/14/13 1834 Last data filed at 10/14/13 1539  Gross per 24 hour  Intake    635 ml  Output      0 ml  Net    635 ml    Exam:   General:  Pt is alert, follows commands appropriately, not in acute distress  Cardiovascular: Regular rate and rhythm, S1/S2, no murmurs  Respiratory: Clear to auscultation bilaterally, no wheezing, no crackles, no rhonchi  Abdomen: Soft, tender in mid abdomen, non  distended, bowel sounds present, (+) PEG in place  Extremities: No edema, pulses DP and PT palpable bilaterally  Neuro: Grossly nonfocal  Data Reviewed: Basic Metabolic Panel:  Recent Labs Lab 10/11/13 0451 10/11/13 0541 10/12/13 0550 10/13/13 0600 10/13/13 2029 10/14/13 0600  NA 136*  --  136* 138 133* 137  K 3.9  --  3.9 3.5* 3.8 3.7  CL 99  --  101 101 96 102  CO2 21  --  25 27 23 25   GLUCOSE 83  --  147* 114* 113* 110*  BUN 6  --  5* 9 8 7   CREATININE 0.77  --  0.73 0.72 0.65 0.72  CALCIUM 8.5  --  8.6 8.6 9.5 8.6  MG  --  1.6 1.5  --    --   --   PHOS  --  3.2 2.8  --   --   --    Liver Function Tests:  Recent Labs Lab 10/11/13 0451 10/12/13 0550 10/13/13 2029 10/14/13 0600  AST 18 13 52* 41*  ALT 48* 35 70* 58*  ALKPHOS 140* 121* 161* 123*  BILITOT 0.3 <0.2* 0.4 0.3  PROT 6.5 6.0 7.8 6.0  ALBUMIN 2.9* 2.7* 3.6 2.7*    Recent Labs Lab 10/13/13 2029  LIPASE 54   No results found for this basename: AMMONIA,  in the last 168 hours CBC:  Recent Labs Lab 10/11/13 0451 10/12/13 0550 10/13/13 2029 10/14/13 0600  WBC 6.9 5.7 7.7 6.3  NEUTROABS  --  3.2 5.7  --   HGB 8.0* 7.6* 10.1* 8.1*  HCT 25.5* 23.8* 31.3* 25.0*  MCV 82.0 80.1 80.1 79.4  PLT 377 340 447* 317   Cardiac Enzymes:  Recent Labs Lab 10/09/13 2125  TROPONINI <0.30   BNP: No components found with this basename: POCBNP,  CBG:  Recent Labs Lab 10/12/13 2023 10/13/13 0017 10/13/13 0407 10/13/13 0738 10/13/13 1135  GLUCAP 140* 143* 141* 132* 163*    No results found for this or any previous visit (from the past 240 hour(s)).   Scheduled Meds: . carvedilol  6.25 mg Oral BID WC  . docusate sodium  100 mg Oral BID  . fentaNYL  25 mcg Transdermal Q72H  . FLUoxetine  20 mg Oral Daily  . folic acid  1 mg Oral Daily  . heparin  5,000 Units Subcutaneous 3 times per day  . metoCLOPramide (REGLAN) injection  5 mg Intravenous TID AC  . multivitamin with minerals  1 tablet Oral Daily  . pantoprazole  40 mg Oral Daily  . protein supplement  1 packet Oral BID  . thiamine  100 mg Oral Daily   Continuous Infusions: . dextrose 5 % and 0.9% NaCl 50 mL/hr at 10/14/13 0030

## 2013-10-14 NOTE — Progress Notes (Signed)
INITIAL NUTRITION ASSESSMENT  DOCUMENTATION CODES Per approved criteria  -Non-severe (moderate) malnutrition in the context of chronic illness  Pt meets criteria for moderate MALNUTRITION in the context of chronic illness as evidenced by 6% body weight loss in one month, PO intake < 75% for one month.   INTERVENTION: -Recommend Unjury Chocolate protein supplement BID  -Encouraged intake of small frequent meals -Will continue to monitor  NUTRITION DIAGNOSIS: Inadequate oral intake related to nausea/early satiety as evidenced by PO intake < 75%   Goal: Pt to meet >/= 90% of their estimated nutrition needs    Monitor:  Total protein/energy intake, labs, weights, GI profile, nutrition support needs  Reason for Assessment: MST  48 y.o. female  Admitting Dx: <principal problem not specified>  ASSESSMENT: Paula [name] is a 48 y.o. female with recent diagnosis of gastric carcinoma presents with intractable vomiting. Patient was discharged today from the hospital after a month long stay. She has been admitted on July 25th for chest and abdominal pain. Patient was evaluated by GI and had an EGD which revealed presence of 3 antral ulcers. Pathology however revealed poorly differentiated adenocarcinoma and the patient underwent distal gastrectomy with a Billroth II anastomosis. Patient had ongoing issues with vomiting and therefore her hospital stay was prolonged. Patient states that she went home today and came back after only an hour of being home.  -Pt familiar to RD from previous admit. Prior to d/c, pt has been receiving TPN d/t continued poor PO intake and intolerance to tube feeding.  -On 8/22, pt received Clinimix 5/15 at 60 ml/hr with goal of 80 ml/hr to modify to a cycling regimen on 8/24 as tolerated -Had been consuming small amounts of Full liquid diet, < 50% d/t ongoing nausea, vomiting, and feelings of early satiety. Had been educated on 5 meals/snacks daily,soft high  protein/kcal foods, and ways to relieve early satiety -Pt endorsed weight loss, had lost 10-15 lbs prior to admit in 08/2013, and has lost an additional 5 lbs during one month hospitalization (some of which may be attributed to fluid fluctuations as pt was receiving TPN for over 2 weeks of hospitalization) -Per discussion with pharmacy, MD holding TPN initiation and advanced to regular diet. Will monitor PO intake and recommend TF initiation via Jtube as warranted  Height: Ht Readings from Last 1 Encounters:  10/14/13 5' (1.524 m)    Weight: Wt Readings from Last 1 Encounters:  10/14/13 146 lb 2.6 oz (66.3 kg)    Ideal Body Weight: 100 lbs  % Ideal Body Weight: 146%  Wt Readings from Last 10 Encounters:  10/14/13 146 lb 2.6 oz (66.3 kg)  10/02/13 155 lb 6.8 oz (70.5 kg)  10/02/13 155 lb 6.8 oz (70.5 kg)  10/02/13 155 lb 6.8 oz (70.5 kg)  09/13/13 150 lb (68.04 kg)  09/11/13 150 lb (68.04 kg)  08/15/13 157 lb 12.8 oz (71.578 kg)  08/08/13 158 lb (71.668 kg)  07/25/13 170 lb (77.111 kg)  07/23/13 175 lb (79.379 kg)    Usual Body Weight: 165-170 lbs  % Usual Body Weight: 88%  BMI:  Body mass index is 28.55 kg/(m^2). Overweight  Estimated Nutritional Needs: Kcal: 1800-2000 Protein: 85-95 gram Fluid: >/=1800 ml/daily  Skin: WDL  Diet Order: General  EDUCATION NEEDS: -Education needs addressed   Intake/Output Summary (Last 24 hours) at 10/14/13 1139 Last data filed at 10/14/13 0600  Gross per 24 hour  Intake    275 ml  Output  0 ml  Net    275 ml    Last BM: 8/22   Labs:   Recent Labs Lab 10/11/13 0541 10/12/13 0550 10/13/13 0600 10/13/13 2029 10/14/13 0600  NA  --  136* 138 133* 137  K  --  3.9 3.5* 3.8 3.7  CL  --  101 101 96 102  CO2  --  25 27 23 25   BUN  --  5* 9 8 7   CREATININE  --  0.73 0.72 0.65 0.72  CALCIUM  --  8.6 8.6 9.5 8.6  MG 1.6 1.5  --   --   --   PHOS 3.2 2.8  --   --   --   GLUCOSE  --  147* 114* 113* 110*    CBG (last  3)   Recent Labs  10/13/13 0407 10/13/13 0738 10/13/13 1135  GLUCAP 141* 132* 163*    Scheduled Meds: . carvedilol  6.25 mg Oral BID WC  . docusate sodium  100 mg Oral BID  . fentaNYL  25 mcg Transdermal Q72H  . FLUoxetine  20 mg Oral Daily  . folic acid  1 mg Oral Daily  . heparin  5,000 Units Subcutaneous 3 times per day  . metoCLOPramide  5 mg Oral TID AC & HS  . multivitamin with minerals  1 tablet Oral Daily  . pantoprazole  40 mg Oral Daily  . protein supplement  1 packet Oral BID  . thiamine  100 mg Oral Daily    Continuous Infusions: . dextrose 5 % and 0.9% NaCl 50 mL/hr at 10/14/13 0030    Past Medical History  Diagnosis Date  . Headache(784.0) 2011    Migraines  . Esophageal reflux     On omeprazole  . Costochondritis   . Hypertension     started around 2011  . Ovarian cyst     Past Surgical History  Procedure Laterality Date  . Oophorectomy      Around 1985 left ovary removal  . Tonsillectomy      Around 1994  . Tubal ligation    . Esophagogastroduodenoscopy N/A 09/16/2013    Procedure: ESOPHAGOGASTRODUODENOSCOPY (EGD);  Surgeon: Wonda Horner, MD;  Location: [facility];  Service: Endoscopy;  Laterality: N/A;  . Laparoscopy N/A 09/20/2013    Procedure: DIAGNOSTIC LAPAROSCOPY,DISTAL GASTRECTOMY AND FEEDING GASTROJEJUNOSTOMY;  Surgeon: [name], MD;  Location: [facility];  Service: General;  Laterality: N/A;    Atlee Abide MS RD LDN Clinical Dietitian [phone]

## 2013-10-14 NOTE — Care Management Note (Signed)
    Page 1 of 1   10/14/2013     3:48:11 PM CARE MANAGEMENT NOTE 10/14/2013  Patient:  Paula [name], Paula [name]   Account Number:  [id number]  Date Initiated:  10/14/2013  Documentation initiated by:  [facility]  Subjective/Objective Assessment:   [place].READMIT 7/25-8/23/15     Action/Plan:   FROM HOME.ACTIVE [name] HHRN-TPN.   Anticipated DC Date:  10/17/2013   Anticipated DC Plan:  [place]  CM consult      [facility] Choice  Resumption Of Svcs/PTA Provider   Choice offered to / List presented to:             Status of service:  In process, will continue to follow Medicare Important Message given?   (If response is "NO", the following Medicare IM given date fields will be blank) Date Medicare IM given:   Medicare IM given by:   Date Additional Medicare IM given:   Additional Medicare IM given by:    Discharge Disposition:    Per UR Regulation:  Reviewed for med. necessity/level of care/duration of stay  If discussed at [place] of Stay Meetings, dates discussed:    Comments:  10/14/13 Latif Nazareno RN,BSN NCM [place] OF HHRN-TPN MANAGEMENT IF NEEDED @ D/C.AWAIT FINAL HHRN ORDER.

## 2013-10-15 DIAGNOSIS — E440 Moderate protein-calorie malnutrition: Secondary | ICD-10-CM

## 2013-10-15 LAB — CBC
HEMATOCRIT: 27 % — AB (ref 36.0–46.0)
Hemoglobin: 8.4 g/dL — ABNORMAL LOW (ref 12.0–15.0)
MCH: 25.4 pg — ABNORMAL LOW (ref 26.0–34.0)
MCHC: 31.1 g/dL (ref 30.0–36.0)
MCV: 81.6 fL (ref 78.0–100.0)
Platelets: 319 10*3/uL (ref 150–400)
RBC: 3.31 MIL/uL — AB (ref 3.87–5.11)
RDW: 15.1 % (ref 11.5–15.5)
WBC: 4.2 10*3/uL (ref 4.0–10.5)

## 2013-10-15 LAB — GLUCOSE, CAPILLARY: Glucose-Capillary: 125 mg/dL — ABNORMAL HIGH (ref 70–99)

## 2013-10-15 MED ORDER — HYDROMORPHONE HCL PF 1 MG/ML IJ SOLN
1.0000 mg | Freq: Once | INTRAMUSCULAR | Status: AC
  Administered 2013-10-15: 1 mg via INTRAVENOUS
  Filled 2013-10-15: qty 1

## 2013-10-15 MED ORDER — MORPHINE SULFATE (CONCENTRATE) 10 MG /0.5 ML PO SOLN
10.0000 mg | ORAL | Status: DC | PRN
  Administered 2013-10-15: 10 mg via ORAL
  Filled 2013-10-15: qty 0.5

## 2013-10-15 NOTE — Progress Notes (Signed)
Progress Note   Paula [name] TTS:[id number] DOB: 24-Sep-1965 DOA: 10/13/2013 PCP: Arnoldo Morale, MD   Brief Narrative:   Paula [name] is an 48 y.o. female with a PMH of hypertension, GERD, and migraine headaches who was originally admitted on 09/14/13 with a chief complaint of chest/abdominal pain accompanied by vomiting. Initial workup in the ED revealed a possible right lower lobe pneumonia. GI consultation was requested on 09/15/13 secondary to persistent vomiting. She underwent EGD on 09/16/13 were 3 antral ulcers were discovered. 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 Oncology was subsequently consulted with recommendations for surgical resection followed by possible chemotherapy. Patient subsequently underwent a diagnostic laparoscopy with distal gastrectomy/Billroth II anastomosis and feeding G-tube on 09/20/13. Surgical margins were clear and 4/11 lymph nodes were positive. Her hospital course was complicated by persistent postoperative nausea/vomiting, but she was discharged home 10/13/13 on bowel rest, TNA. She returned that evening with a chief complaint of ongoing vomiting and abdominal pain.  Assessment/Plan:   Principal problem: Persistent vomiting in a patient with newly diagnosed poorly differentiated Adenocarcinoma Gastric. Antral Ulcer   No evidence of obstruction on abdominal films.  Diet advanced to regular foods which she has tolerated.  Active problems Essential hypertension and tachycardia   Continue Coreg.   Protein calorie malnutrition moderate  TNA discontinued on admission.  DVT Prophylaxis  Continue heparin.  Code Status: Full. Family Communication: Updated son, [name] at bedside. Disposition Plan: Home when stable.   IV Access:    PICC   Procedures and diagnostic studies:   Dg Abd Acute W/chest 10/13/2013: Fibrosis or atelectasis in the right lung base. Nonobstructive bowel gas  pattern. No free air. Residual contrast material in the colon.   Medical Consultants:    None   Other Consultants:    Dietician  Anti-Infectives:    None.  Subjective:   Paula [name] tells me she has not had any further vomiting. She's been using her pain medications regularly, but does not feel well enough to go home today.  Objective:    Filed Vitals:   10/14/13 1538 10/14/13 2123 10/14/13 2308 10/15/13 0617  BP: 117/67 129/83 127/75 97/55  Pulse: 89 93 100 103  Temp: 98.6 F (37 C) 97.5 F (36.4 C) 98.7 F (37.1 C) 98.5 F (36.9 C)  TempSrc: Oral Oral Oral Oral  Resp: 17 16 18 16   Height:      Weight:      SpO2: 100% 98% 100% 100%    Intake/Output Summary (Last 24 hours) at 10/15/13 0923 Last data filed at 10/15/13 0617  Gross per 24 hour  Intake    840 ml  Output    600 ml  Net    240 ml    Exam: Gen: NAD Cardiovascular:  Tachy/regular, No M/R/G Respiratory:  Lungs CTAB Gastrointestinal:  Abdomen soft,mildly tender, +GJ tube, + BS Extremities:  No C/E/C   Data Reviewed:    Labs: Basic Metabolic Panel:  Recent Labs Lab 10/11/13 0451 10/11/13 0541 10/12/13 0550 10/13/13 0600 10/13/13 2029 10/14/13 0600  NA 136*  --  136* 138 133* 137  K 3.9  --  3.9 3.5* 3.8 3.7  CL 99  --  101 101 96 102  CO2 21  --  25 27 23 25   GLUCOSE 83  --  147* 114* 113* 110*  BUN 6  --  5* 9 8 7   CREATININE 0.77  --  0.73 0.72 0.65 0.72  CALCIUM 8.5  --  8.6 8.6 9.5 8.6  MG  --  1.6 1.5  --   --   --   PHOS  --  3.2 2.8  --   --   --    GFR Estimated Creatinine Clearance: 74.6 ml/min (by C-G formula based on Cr of 0.72). Liver Function Tests:  Recent Labs Lab 10/11/13 0451 10/12/13 0550 10/13/13 2029 10/14/13 0600  AST 18 13 52* 41*  ALT 48* 35 70* 58*  ALKPHOS 140* 121* 161* 123*  BILITOT 0.3 <0.2* 0.4 0.3  PROT 6.5 6.0 7.8 6.0  ALBUMIN 2.9* 2.7* 3.6 2.7*   CBG:  Recent Labs Lab 10/13/13 0017 10/13/13 0407 10/13/13 0738  10/13/13 1135 10/15/13 0817  GLUCAP 143* 141* 132* 163* 125*     Medications:   . carvedilol  6.25 mg Oral BID WC  . docusate sodium  100 mg Oral BID  . fentaNYL  25 mcg Transdermal Q72H  . FLUoxetine  20 mg Oral Daily  . folic acid  1 mg Oral Daily  . heparin  5,000 Units Subcutaneous 3 times per day  . LORazepam  1 mg Oral Once  . metoCLOPramide (REGLAN) injection  5 mg Intravenous TID AC  . multivitamin with minerals  1 tablet Oral Daily  . pantoprazole  40 mg Oral Daily  . protein supplement  1 packet Oral BID  . thiamine  100 mg Oral Daily   Continuous Infusions: . dextrose 5 % and 0.9% NaCl 50 mL/hr at 10/14/13 0030    Time spent: 25 minutes.   LOS: 2 days   Denver Harder  [facility] Pager [phone]. If unable to reach me by pager, please call my cell phone at [phone].  *Please refer to [email or web address], password [email or web address] to get updated schedule on who will round on this patient, as hospitalists switch teams weekly. If 7PM-7AM, please contact night-coverage at [email or web address], password [email or web address] for any overnight needs.  10/15/2013, 8:33 AM

## 2013-10-15 NOTE — Progress Notes (Signed)
Pt was lying in bed when I arrived. She said she felt nauseous and felt like she had stayed up too long with her visitors. Visit was very short as pt appeared tired and sleepy. She asked for a return visit tomorrow. Will refer her to chaplain for visit on Wednesday. Ernest Haber Chaplain  10/15/13 1300  Clinical Encounter Type  Visited With Patient

## 2013-10-15 NOTE — Progress Notes (Signed)
Stopped by pt rm. Pt sitting up w/company. Will ck on pt ltr as time permits. Ernest Haber Chaplain  10/13/13 2300  Clinical Encounter Type  Visited With Patient and family together

## 2013-10-16 ENCOUNTER — Other Ambulatory Visit: Payer: Self-pay | Admitting: Internal Medicine

## 2013-10-16 DIAGNOSIS — C169 Malignant neoplasm of stomach, unspecified: Secondary | ICD-10-CM

## 2013-10-16 DIAGNOSIS — M549 Dorsalgia, unspecified: Secondary | ICD-10-CM

## 2013-10-16 DIAGNOSIS — K259 Gastric ulcer, unspecified as acute or chronic, without hemorrhage or perforation: Secondary | ICD-10-CM

## 2013-10-16 LAB — CBC
HEMATOCRIT: 25.2 % — AB (ref 36.0–46.0)
HEMOGLOBIN: 8.1 g/dL — AB (ref 12.0–15.0)
MCH: 25.5 pg — ABNORMAL LOW (ref 26.0–34.0)
MCHC: 32.1 g/dL (ref 30.0–36.0)
MCV: 79.2 fL (ref 78.0–100.0)
Platelets: 299 10*3/uL (ref 150–400)
RBC: 3.18 MIL/uL — AB (ref 3.87–5.11)
RDW: 14.9 % (ref 11.5–15.5)
WBC: 4.4 10*3/uL (ref 4.0–10.5)

## 2013-10-16 LAB — GLUCOSE, CAPILLARY: Glucose-Capillary: 131 mg/dL — ABNORMAL HIGH (ref 70–99)

## 2013-10-16 MED ORDER — SENNOSIDES 8.8 MG/5ML PO SYRP
5.0000 mL | ORAL_SOLUTION | Freq: Every day | ORAL | Status: DC
  Administered 2013-10-16: 5 mL
  Filled 2013-10-16 (×2): qty 5

## 2013-10-16 MED ORDER — OXYCODONE HCL 5 MG/5ML PO SOLN
5.0000 mg | ORAL | Status: DC | PRN
  Administered 2013-10-16 (×2): 5 mg
  Filled 2013-10-16 (×2): qty 5

## 2013-10-16 MED ORDER — LIDOCAINE 5 % EX PTCH
1.0000 | MEDICATED_PATCH | CUTANEOUS | Status: DC
  Administered 2013-10-16: 1 via TRANSDERMAL
  Filled 2013-10-16 (×2): qty 1

## 2013-10-16 MED ORDER — HYDROMORPHONE HCL PF 1 MG/ML IJ SOLN
1.0000 mg | Freq: Once | INTRAMUSCULAR | Status: AC
  Administered 2013-10-16: 1 mg via INTRAVENOUS
  Filled 2013-10-16: qty 1

## 2013-10-16 MED ORDER — HYDROMORPHONE HCL PF 1 MG/ML IJ SOLN
0.5000 mg | Freq: Once | INTRAMUSCULAR | Status: AC
  Administered 2013-10-16: 0.5 mg via INTRAVENOUS

## 2013-10-16 MED ORDER — HYDROMORPHONE HCL PF 1 MG/ML IJ SOLN
INTRAMUSCULAR | Status: AC
  Filled 2013-10-16: qty 1

## 2013-10-16 NOTE — Consult Note (Signed)
Patient Paula [name]      DOB: 18-Jun-1965      JSH:[id number]     Consult Note from the Palliative Medicine Team at [place] Requested by: Dr. Charlies [name]     PCP: Paula Morale, MD Reason for Consultation: Pain control     Phone Number:[phone]  Assessment of patients Current state: 48 yr old african [place] female diagnosed with adenocarcinoma after being evaluated for multiple ulcers in her stomach.  Patient under went resections J/G tube placment.  She has had trouble with nausea and vomiting but has been able to to eat some small frequent meals .  She reports that strong flavors increase her anusea and so her pain meds have not been working well. She reports the pain to be constant in her lower back. MrI did not show met .  Patient very anxious, very fastidious with cleanliness and body odors.  We talked about her stressors and the feelings that are coming into play . We agreed to try to use her G tube to decrease the bad flavors that stimulate nausea and vomiting. Reinforeced frequent bland soft meals.  She felt she had to clear her plate. She was talking about getting her J/G tube removed , I asked her to consider keeping that to support her for a little while longer.      Goals of Care: 1.  Code Status: Full code ( not reviewed)   2. Scope of Treatment: Patient seeking full curative treatments at this time.  Has not been able to follow up with Dr. Earlie [name] due to being in the hospital for last 30 days. She did share that she does not want to be a burden to her boys and so she has told them that she would be open to facility care if she needed that kind of support. Ideally, she is hoping to back to her home with family help.   4. Disposition: home with home health.   3. Symptom Management:   1. Anxiety/Agitation: patient would likely benefit from psycho social support from in the [place]. Don't think that she needs additional prn meds at this.  Continue her prozac.  2. Pain: continue fentanyl at 25 mcg/hr but add oxyir 5 mg q 3 hrs via the tube 3. Bowel Regimen: change to senna syrup via tube 4. Nausea/Vomiting: continue reglan, consider switching phenergan to syrup and can use zofran oDT  4. 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  She had her first child at 28 and another son later on . She supported them and paid her own way doing jobs in the [employer]. She had a hard life. She is very proud to have raised them and now has her own home and is returning to school.  5. Spiritual:  Paula [name] faith is important to her Chaplain involved.        Patient Documents Completed or Given: Document Given Completed  Advanced Directives Pkt    MOST    DNR    Gone from My Sight    Hard Choices      Brief HPI: 48 yr old african [place] female with recent diagnosis of adenocarcinoma of the stomach presenting as multiple ulcerations of the stomach. Asked to help with back pain and nausea and vomiting.   ROS: lower back pain, intermittent rib pain, intermittent nausea and vomiting, obstipation    PMH:  Past Medical History  Diagnosis Date  . IFOYDXAJ(287.8) 2011  Migraines  . Esophageal reflux     On omeprazole  . Costochondritis   . Hypertension     started around 2011  . Ovarian cyst      PSH: Past Surgical History  Procedure Laterality Date  . Oophorectomy      Around 1985 left ovary removal  . Tonsillectomy      Around 1994  . Tubal ligation    . Esophagogastroduodenoscopy N/A 09/16/2013    Procedure: ESOPHAGOGASTRODUODENOSCOPY (EGD);  Surgeon: Paula Horner, MD;  Location: [facility];  Service: Endoscopy;  Laterality: N/A;  . Laparoscopy N/A 09/20/2013    Procedure: DIAGNOSTIC LAPAROSCOPY,DISTAL GASTRECTOMY AND FEEDING GASTROJEJUNOSTOMY;  Surgeon: Paula Klein, MD;  Location: [facility];  Service: General;  Laterality: N/A;   I have reviewed the [place] and SH and  If  appropriate update it with new information. No Known Allergies Scheduled Meds: . carvedilol  6.25 mg Oral BID WC  . fentaNYL  25 mcg Transdermal Q72H  . FLUoxetine  20 mg Oral Daily  . folic acid  1 mg Oral Daily  . HYDROmorphone      . LORazepam  1 mg Oral Once  . metoCLOPramide (REGLAN) injection  5 mg Intravenous TID AC  . multivitamin with minerals  1 tablet Oral Daily  . pantoprazole  40 mg Oral Daily  . protein supplement  1 packet Oral BID  . sennosides  5 mL Per Tube QHS  . thiamine  100 mg Oral Daily   Continuous Infusions: . dextrose 5 % and 0.9% NaCl 50 mL/hr at 10/16/13 0610   PRN Meds:.ondansetron (ZOFRAN) IV, ondansetron, oxyCODONE, polyethylene glycol, promethazine, zolpidem    BP 137/86  Pulse 92  Temp(Src) 98 F (36.7 C) (Oral)  Resp 16  Ht 5' (1.524 m)  Wt 66.3 kg (146 lb 2.6 oz)  BMI 28.55 kg/m2  SpO2 100%  LMP 09/12/2013   PPS: 50%   Intake/Output Summary (Last 24 hours) at 10/16/13 1759 Last data filed at 10/16/13 1430  Gross per 24 hour  Intake 2648.34 ml  Output   1250 ml  Net 1398.34 ml   LBM: PTA  Physical Exam:  General: mild distress from back pain, clear speech coherent with capacity for decision making. HEENT:  PERRL, EOMI, Anicteric, mmm Chest:   Decreased but clear, no rhonchi, rales or wheezes CVS: regular, S1, S2 Abdomen: soft, j/G tube in place Ext: trace non pitting edema, puffy lower extremities Neuro: awake, alert oriented CN II-X11 intact  Labs: CBC    Component Value Date/Time   WBC 4.4 10/16/2013 0615   RBC 3.18* 10/16/2013 0615   HGB 8.1* 10/16/2013 0615   HCT 25.2* 10/16/2013 0615   PLT 299 10/16/2013 0615   MCV 79.2 10/16/2013 0615   MCH 25.5* 10/16/2013 0615   MCHC 32.1 10/16/2013 0615   RDW 14.9 10/16/2013 0615   LYMPHSABS 1.4 10/13/2013 2029   MONOABS 0.5 10/13/2013 2029   EOSABS 0.1 10/13/2013 2029   BASOSABS 0.0 10/13/2013 2029      CMP     Component Value Date/Time   NA 137 10/14/2013 0600   K 3.7  10/14/2013 0600   CL 102 10/14/2013 0600   CO2 25 10/14/2013 0600   GLUCOSE 110* 10/14/2013 0600   BUN 7 10/14/2013 0600   CREATININE 0.72 10/14/2013 0600   CALCIUM 8.6 10/14/2013 0600   PROT 6.0 10/14/2013 0600   ALBUMIN 2.7* 10/14/2013 0600   AST 41* 10/14/2013 0600   ALT 58* 10/14/2013 0600  ALKPHOS 123* 10/14/2013 0600   BILITOT 0.3 10/14/2013 0600   GFRNONAA >90 10/14/2013 0600   GFRAA >90 10/14/2013 0600    ABd/Chest Xray Reviewed/Impressions: Fibrosis or atelectasis in the right lung base. Nonobstructive bowel  gas pattern. No free air. Residual contrast material in the colon.    MRI lumbar spine:Negative for fracture or metastatic disease. Bone marrow signal is  normal. No explanation for the bone scan finding at L1.    Time In Time Out Total Time Spent with Patient Total Overall Time  345 am 500 pm 75 min  75 min    Greater than 50%  of this time was spent counseling and coordinating care related to the above assessment and plan.  Jose Alleyne L. Lovena Le, MD MBA The Palliative Medicine Team at [facility] Phone: [phone] Pager: [phone] ( Use team phone after hours)

## 2013-10-16 NOTE — ED Provider Notes (Signed)
Medical screening examination/treatment/procedure(s) were performed by non-physician practitioner and as supervising physician I was immediately available for consultation/collaboration.   EKG Interpretation   Date/Time:  Sunday October 13 2013 20:21:01 EDT Ventricular Rate:  124 PR Interval:  105 QRS Duration: 66 QT Interval:  300 QTC Calculation: 431 R Axis:   67 Text Interpretation:  Sinus tachycardia Probable left atrial enlargement  Probable left ventricular hypertrophy Borderline T abnormalities, inferior  leads No significant change since last tracing Confirmed by Taylia Berber  MD, Hart Haas  ([id number]) on 10/13/2013 8:25:28 PM        Wandra Arthurs, MD 10/16/13 [phone]

## 2013-10-16 NOTE — Progress Notes (Signed)
Patient ID: Paula [name], female   DOB: 02/20/1966, 48 y.o.   MRN: [id number] [facility] PROGRESS NOTE  Paula [name] RFF:[id number] DOB: 1966-02-11 DOA: 11-11-13 PCP: Arnoldo Morale, MD  Brief narrative: 48 y.o. female with a PMH of hypertension, GERD, and migraine headaches who was originally admitted on 09/14/13 with a chief complaint of chest/abdominal pain accompanied by vomiting. Initial workup in the ED revealed a possible right lower lobe pneumonia. GI consultation was requested on 09/15/13 secondary to persistent vomiting. She underwent EGD on 09/16/13 were 3 antral ulcers were discovered. 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 Oncology was subsequently consulted with recommendations for surgical resection followed by possible chemotherapy. Patient subsequently underwent a diagnostic laparoscopy with distal gastrectomy/Billroth II anastomosis and feeding G-tube on 09/20/13. Surgical margins were clear and 4/11 lymph nodes were positive. Her hospital course was complicated by persistent postoperative nausea/vomiting, but she was discharged home 11/11/13 on bowel rest, TNA. She returned that evening with a chief complaint of ongoing vomiting and abdominal pain.   Assessment/Plan:   Principal problem:  Persistent vomiting in a patient with newly diagnosed poorly differentiated Adenocarcinoma Gastric. Antral Ulcer  No evidence of obstruction on abdominal films.  Diet advanced to regular foods and she has tolerated it for past 48 hours. Active problems Essential hypertension and tachycardia  Continue Coreg.  Protein calorie malnutrition moderate  TNA discontinued on admission. Pt asked to remove PEG tube. Order placed for IR  DVT Prophylaxis  Continue heparin.  Code Status: Full.  Family Communication: Updated son, Paula [name] at bedside.  Disposition Plan: Home when stable.   IV Access:   PICC Procedures and diagnostic  studies:    Dg Abd Acute W/chest 11-Nov-2013: Fibrosis or atelectasis in the right lung base. Nonobstructive bowel gas pattern. No free air. Residual contrast material in the colon.  Medical Consultants:   None Other Consultants:   Dietician Anti-Infectives:   None.   Paula Lenz, MD  [facility] Pager [phone]  If 7PM-7AM, please contact night-coverage [email or web address] Password [email or web address] 10/16/2013, 5:42 PM   LOS: 3 days    HPI/Subjective: No acute overnight events.  Objective: Filed Vitals:   10/15/13 0617 10/15/13 2228 10/16/13 0647 10/16/13 1430  BP: 97/55 129/81 116/74 137/86  Pulse: 103 102 102 92  Temp: 98.5 F (36.9 C) 98.5 F (36.9 C) 98.3 F (36.8 C) 98 F (36.7 C)  TempSrc: Oral Oral Oral Oral  Resp: 16 20 20 16   Height:      Weight:      SpO2: 100% 99% 98% 100%    Intake/Output Summary (Last 24 hours) at 10/16/13 1742 Last data filed at 10/16/13 1430  Gross per 24 hour  Intake 2648.34 ml  Output   1250 ml  Net 1398.34 ml    Exam:   General:  Pt is alert, follows commands appropriately, not in acute distress  Cardiovascular: Regular rate and rhythm, S1/S2, no murmurs  Respiratory: Clear to auscultation bilaterally, no wheezing, no crackles, no rhonchi  Abdomen: Soft, non tender, non distended, bowel sounds present; has PEG  Extremities: No edema, pulses DP and PT palpable bilaterally  Neuro: Grossly nonfocal  Data Reviewed: Basic Metabolic Panel:  Recent Labs Lab 10/11/13 0451 10/11/13 0541 10/12/13 0550 11/11/13 0600 11-11-2013 2029 10/14/13 0600  NA 136*  --  136* 138 133* 137  K 3.9  --  3.9 3.5* 3.8 3.7  CL 99  --  101 101 96 102  CO2 21  --  25 27 23 25   GLUCOSE 83  --  147* 114* 113* 110*  BUN 6  --  5* 9 8 7   CREATININE 0.77  --  0.73 0.72 0.65 0.72  CALCIUM 8.5  --  8.6 8.6 9.5 8.6  MG  --  1.6 1.5  --   --   --   PHOS  --  3.2 2.8  --   --   --    Liver Function Tests:  Recent Labs Lab 10/11/13 0451  10/12/13 0550 10/13/13 2029 10/14/13 0600  AST 18 13 52* 41*  ALT 48* 35 70* 58*  ALKPHOS 140* 121* 161* 123*  BILITOT 0.3 <0.2* 0.4 0.3  PROT 6.5 6.0 7.8 6.0  ALBUMIN 2.9* 2.7* 3.6 2.7*    Recent Labs Lab 10/13/13 2029  LIPASE 54   No results found for this basename: AMMONIA,  in the last 168 hours CBC:  Recent Labs Lab 10/12/13 0550 10/13/13 2029 10/14/13 0600 10/15/13 0625 10/16/13 0615  WBC 5.7 7.7 6.3 4.2 4.4  NEUTROABS 3.2 5.7  --   --   --   HGB 7.6* 10.1* 8.1* 8.4* 8.1*  HCT 23.8* 31.3* 25.0* 27.0* 25.2*  MCV 80.1 80.1 79.4 81.6 79.2  PLT 340 447* 317 319 299   Cardiac Enzymes:  Recent Labs Lab 10/09/13 2125  TROPONINI <0.30   BNP: No components found with this basename: POCBNP,  CBG:  Recent Labs Lab 10/13/13 0407 10/13/13 0738 10/13/13 1135 10/15/13 0817 10/16/13 0739  GLUCAP 141* 132* 163* 125* 131*    No results found for this or any previous visit (from the past 240 hour(s)).   Scheduled Meds: . carvedilol  6.25 mg Oral BID WC  . docusate sodium  100 mg Oral BID  . fentaNYL  25 mcg Transdermal Q72H  . FLUoxetine  20 mg Oral Daily  . folic acid  1 mg Oral Daily  . HYDROmorphone      . LORazepam  1 mg Oral Once  . metoCLOPramide (REGLAN) injection  5 mg Intravenous TID AC  . multivitamin with minerals  1 tablet Oral Daily  . pantoprazole  40 mg Oral Daily  . protein supplement  1 packet Oral BID  . thiamine  100 mg Oral Daily   Continuous Infusions: . dextrose 5 % and 0.9% NaCl 50 mL/hr at 10/16/13 [phone]

## 2013-10-16 NOTE — Consult Note (Signed)
Patient ZO:XWRUEAV A Smith-Golden      DOB: 1965-06-16      WUJ:[id number]  Summary of Consult; full note to follow:  Met with [place].  Reason for consult : pain management   Koraline reports that the pain in question is in her back . In general, there are times were is ok but not completely gone. She reports she does not notice a lot of difference with the fentanyl patch, and that the oral pain medications tend to make her nausea worse especially when certain flavors are introduced.  She reports it is difficult to want pain relief that she knows she can have from the IV dilaudid if she has to trade nausea for relief. We talked about trailing the more concentrated oxyir, and adding a lidocaine patch for tonight then reevaluating.  She may need an increase in her fentanyl patch but she really has not tried using the oral meds for breakthrough.  Lesion on L1 not consistent on MRI with met, but bone scan showed abnormal area.  Discussed alternative uses for J/G tube for future support.  Patient is very conscientious about bodily smells ( sick smells and being clean).  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  Back pain:  Lidocaine patch, trial oxyir concentrated solution for breakthrough vs use G portion of tube to deliver meds.  May need to increase fentanyl patch.  Added heat pack now until lidocaine can be placed.  Will order air mattress overlay.  2.  Patient very conscious of smells in the room and apologized for passing gas.  I provided her with odor elimination spray to decrease anxiety over offending guest.     3.  No bowel movement since Sunday but passing gas. She was started on colace.  Will add senna liquid in her tube.  Patient has a lot of stress over being a burden to her sons . She is trying to maintain her independence and states her favorite  time of the day when she can give herself a bath.   Time:  345 pm- 500 pm  Lorrin Nawrot L. Lovena Le, MD MBA The Palliative Medicine Team at [facility] Phone: [phone] Pager: [phone] ( Use team phone after hours)

## 2013-10-16 NOTE — Progress Notes (Signed)
Follow up with pt at chaplain referral.    Paula [name] expressed exhaustion from visitors yesterday.  Briefly discussed with chaplain dynamics of hospitalization - not being in own space, not feeling / looking like self.   Paula [name] receive a phone call during conversation that she requested to take.  Chaplain will follow up for continued assessment and support.   Paula [name], Paula [name]

## 2013-10-17 ENCOUNTER — Telehealth: Payer: Self-pay | Admitting: Internal Medicine

## 2013-10-17 ENCOUNTER — Telehealth: Payer: Self-pay | Admitting: Medical Oncology

## 2013-10-17 DIAGNOSIS — Z515 Encounter for palliative care: Secondary | ICD-10-CM

## 2013-10-17 DIAGNOSIS — M545 Low back pain, unspecified: Secondary | ICD-10-CM

## 2013-10-17 DIAGNOSIS — R0789 Other chest pain: Secondary | ICD-10-CM

## 2013-10-17 LAB — GLUCOSE, CAPILLARY: Glucose-Capillary: 117 mg/dL — ABNORMAL HIGH (ref 70–99)

## 2013-10-17 MED ORDER — PROMETHAZINE HCL 6.25 MG/5ML PO SYRP: 12.5000 mg | ORAL_SOLUTION | Freq: Four times a day (QID) | ORAL | Status: DC | PRN

## 2013-10-17 MED ORDER — HYDROMORPHONE HCL PF 1 MG/ML IJ SOLN
0.5000 mg | Freq: Once | INTRAMUSCULAR | Status: AC
  Administered 2013-10-17: 0.5 mg via INTRAVENOUS
  Filled 2013-10-17: qty 1

## 2013-10-17 MED ORDER — LIDOCAINE 5 % EX PTCH: 1.0000 | MEDICATED_PATCH | CUTANEOUS | Status: DC

## 2013-10-17 MED ORDER — OXYCODONE HCL 5 MG/5ML PO SOLN: 5.0000 mg | ORAL | Status: DC | PRN

## 2013-10-17 MED ORDER — ZOLPIDEM TARTRATE 5 MG PO TABS: 5.0000 mg | ORAL_TABLET | Freq: Every evening | ORAL | Status: DC | PRN

## 2013-10-17 MED ORDER — UNJURY CHOCOLATE CLASSIC POWDER: 1.0000 | Freq: Two times a day (BID) | ORAL | Status: DC

## 2013-10-17 MED ORDER — ADULT MULTIVITAMIN W/MINERALS CH: 1.0000 | ORAL_TABLET | Freq: Every day | ORAL | Status: DC

## 2013-10-17 MED ORDER — SENNOSIDES 8.8 MG/5ML PO SYRP: 5.0000 mL | ORAL_SOLUTION | Freq: Every day | ORAL | Status: DC

## 2013-10-17 NOTE — Care Management Note (Signed)
CARE MANAGEMENT NOTE 10/17/2013  Patient:  Paula [name], Paula [name]   Account Number:  [id number]  Date Initiated:  10/14/2013  Documentation initiated by:  [facility]  Subjective/Objective Assessment:   [place].READMIT 7/25-8/23/15     Action/Plan:   FROM HOME.ACTIVE [name]/[facility] HHRN-TPN.   Anticipated DC Date:  10/17/2013   Anticipated DC Plan:  [place]  CM consult      [facility] Choice  Resumption Of Svcs/PTA Provider   Choice offered to / List presented to:             Status of service:  In process, will continue to follow Medicare Important Message given?   (If response is "NO", the following Medicare IM given date fields will be blank) Date Medicare IM given:   Medicare IM given by:   Date Additional Medicare IM given:   Additional Medicare IM given by:    Discharge Disposition:    Per UR Regulation:  Reviewed for med. necessity/level of care/duration of stay  If discussed at [place] of Stay Meetings, dates discussed:    Comments:  10/17/13 Marney Doctor RN,NCM Pt no longer has TPN.  She has [name] peg and is taking in [name] regular diet but will keep the peg at DC.  HHRN to do one visit to reinforce peg teaching with flushing and administering meds.  Pt is in agreement with plan.  RN asked to get [facility] order.  Will await order.  No other CM needs.  10/14/13 KATHY MAHABIR RN,BSN NCM [place] READMIT,& FOLLOWING FOR RESUMPTION OF HHRN-TPN MANAGEMENT IF NEEDED @ D/C.AWAIT FINAL HHRN ORDER.

## 2013-10-17 NOTE — Progress Notes (Signed)
R PICC D/C per MD order/37cm intact/vaseline pressure dsg applied/pressure x5 min/no bleeding noted. Lorri Frederick, RN

## 2013-10-17 NOTE — Discharge Summary (Signed)
Physician Discharge Summary  Paula [name] JJO:[id number] DOB: [birth year] DOA: 10-15-2013  PCP: Arnoldo Morale, MD  Admit date: October 15, 2013 Discharge date: 10/17/2013  Recommendations for Outpatient Follow-up:  1. 3 care physician in about one week discharge to make sure symptoms are controlled. Followup with oncology per scheduled appointment.  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 female with a PMH of hypertension, GERD, and migraine headaches who was originally admitted on 09/14/13 with a chief complaint of chest/abdominal pain accompanied by vomiting. Initial workup in the ED revealed a possible right lower lobe pneumonia. GI consultation was requested on 09/15/13 secondary to persistent vomiting. She underwent EGD on 09/16/13 were 3 antral ulcers were discovered. 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 Oncology was subsequently consulted with recommendations for surgical resection followed by possible chemotherapy. Patient subsequently underwent a diagnostic laparoscopy with distal gastrectomy/Billroth II anastomosis and feeding G-tube on 09/20/13. Surgical margins were clear and 4/11 lymph nodes were positive. Her hospital course was complicated by persistent postoperative nausea/vomiting, but she was discharged home 10/15/13 on bowel rest, TNA. She returned that evening with a chief complaint of ongoing vomiting and abdominal pain.   Assessment/Plan:   Principal problem:  Persistent vomiting in a patient with newly diagnosed poorly differentiated Adenocarcinoma Gastric. Antral Ulcer  No evidence of obstruction on abdominal films.  Diet advanced to regular foods and she has  tolerated it for past 72 hours. Patient wanted to have PEG tube out but instructed by Dr. Barry Dienes to keep patent in case patient can no longer tolerate by mouth intake. Patient agreeable to leave the PEG tube in. Active problems Essential hypertension and tachycardia  Continue Coreg.  Protein calorie malnutrition moderate  TNA discontinued on admission.  Remove PICC line prior to discharge. DVT Prophylaxis  Continue heparin while patient is in a hospital.   Code Status: Full.  Family Communication: family not at the bedside this am  IV Access:   PICC Procedures and diagnostic studies:    Dg Abd Acute W/chest 10-15-2013: Fibrosis or atelectasis in the right lung base. Nonobstructive bowel gas pattern. No free air. Residual contrast material in the colon.   Medical Consultants:   None  Other Consultants:   Dietician  Anti-Infectives:   None.     SignedLeisa Lenz, MD  [facility] 10/17/2013, 10:12 AM  Pager #: [phone]   Discharge Exam: Filed Vitals:   10/17/13 0749  BP: 95/65  Pulse: 91  Temp: 97.9 F (36.6 C)  Resp: 20   Filed Vitals:   10/16/13 0647 10/16/13 1430 10/16/13 2248 10/17/13 0749  BP: 116/74 137/86 139/80 95/65  Pulse: 102 92 92 91  Temp: 98.3 F (36.8 C) 98 F (36.7 C) 98.7 F (37.1 C) 97.9 F (36.6 C)  TempSrc: Oral Oral Oral Oral  Resp: 20 16 16 20   Height:      Weight:      SpO2: 98% 100% 100% 98%    General: Pt is alert, follows commands appropriately, not in acute distress Cardiovascular: Regular rate and rhythm, S1/S2 +, no murmurs Respiratory: Clear to auscultation bilaterally, no wheezing, no crackles, no rhonchi Abdominal: Soft, non tender, non distended, bowel sounds +, no guarding; peg in place Extremities: no edema,  no cyanosis, pulses palpable bilaterally DP and PT Neuro: Grossly nonfocal  Discharge Instructions  Discharge Instructions   Call MD for:  difficulty breathing, headache or visual  disturbances    Complete by:  As directed      Call MD for:  persistant dizziness or light-headedness    Complete by:  As directed      Call MD for:  persistant nausea and vomiting    Complete by:  As directed      Call MD for:  redness, tenderness, or signs of infection (pain, swelling, redness, odor or green/yellow discharge around incision site)    Complete by:  As directed      Diet - low sodium heart healthy    Complete by:  As directed      Increase activity slowly    Complete by:  As directed             Medication List    STOP taking these medications       morphine CONCENTRATE 10 mg / 0.5 ml concentrated solution     promethazine 25 MG suppository  Commonly known as:  PHENERGAN  Replaced by:  promethazine 6.25 MG/5ML syrup      TAKE these medications       carvedilol 6.25 MG tablet  Commonly known as:  COREG  Take 6.25 mg by mouth 2 (two) times daily with a meal.     fentaNYL 25 MCG/HR patch  Commonly known as:  DURAGESIC - dosed mcg/hr  Place 1 patch (25 mcg total) onto the skin every 3 (three) days.     FLUoxetine 20 MG capsule  Commonly known as:  PROZAC  Take 1 capsule (20 mg total) by mouth daily.     lidocaine 5 %  Commonly known as:  LIDODERM  Place 1 patch onto the skin daily. Remove & Discard patch within 12 hours or as directed by MD     metoCLOPramide 5 MG tablet  Commonly known as:  REGLAN  Take 1 tablet (5 mg total) by mouth 4 (four) times daily -  before meals and at bedtime.     multivitamin with minerals Tabs tablet  Take 1 tablet by mouth daily.     omeprazole 20 MG capsule  Commonly known as:  PRILOSEC  Take 1 capsule (20 mg total) by mouth 2 (two) times daily.     ondansetron 4 MG disintegrating tablet  Commonly known as:  ZOFRAN ODT  Take 1 tablet (4 mg total) by mouth every 8 (eight) hours as needed for nausea or vomiting.     oxyCODONE 5 MG/5ML solution  Commonly known as:  ROXICODONE  Place 5 mLs (5 mg total) into feeding  tube every 3 (three) hours as needed for severe pain or breakthrough pain.     promethazine 6.25 MG/5ML syrup  Commonly known as:  PHENERGAN  Take 10 mLs (12.5 mg total) by mouth every 6 (six) hours as needed for nausea or vomiting.     protein supplement Powd  Commonly known as:  UNJURY CHOCOLATE CLASSIC  Take 27 g (1 packet total) by mouth 2 (two) times daily.     sennosides 8.8 MG/5ML syrup  Commonly known as:  SENOKOT  Place 5 mLs into feeding tube at bedtime.     zolpidem 5 MG tablet  Commonly known as:  AMBIEN  Take 1 tablet (5 mg total) by mouth at bedtime as needed for sleep.  Follow-up Information   Follow up with Arnoldo Morale, MD. Schedule an appointment as soon as possible for a visit in 1 week. (Follow up appt after recent hospitalization)    Specialty:  Family Medicine   Contact information:   [address] [place] [address] [phone]        The results of significant diagnostics from this hospitalization (including imaging, microbiology, ancillary and laboratory) are listed below for reference.    Significant Diagnostic Studies: Dg Abd 1 View  10/11/2013   CLINICAL DATA:  Intolerance of jejunostomy tube feedings.  EXAM: ABDOMEN - 1 VIEW  COMPARISON:  Abdomen series 8/19 2015.  FINDINGS: Surgical sutures left upper quadrant. Small amount of residual contrast in the colon from prior studies. Jejunostomy tube was injected with nonionic contrast. Jejunostomy tube is widely patent. Proximal small bowel is nondilated.  IMPRESSION: 1. Patent jejunostomy tube.  Proximal small bowel nondilated. 2. No acute abnormality.   Electronically Signed   By: Marcello Moores  Register   On: 10/11/2013 12:11   Dg Abd 1 View  10/09/2013   CLINICAL DATA:  Fecal impaction.  EXAM: ABDOMEN - 1 VIEW  COMPARISON:  September 26, 2013.  FINDINGS: Residual contrast is noted throughout the colon. There is no evidence of bowel obstruction or ileus. Gastrostomy tube is noted in  the left upper quadrant. Phleboliths are noted in the pelvis.  IMPRESSION: No evidence of bowel obstruction or ileus. Residual contrast is noted throughout the colon.   Electronically Signed   By: Sabino Dick M.D.   On: 10/09/2013 15:24   Dg Abd 1 View  09/26/2013   CLINICAL DATA:  Postop ileus.  EXAM: ABDOMEN - 1 VIEW  COMPARISON:  No prior.  FINDINGS: Soft tissue structures are unremarkable. Surgical tubing noted over the right abdomen. Surgical staples and clips noted in the abdomen. Gas pattern is nonspecific. No free air. No acute bony abnormality. Lung bases are clear.  IMPRESSION: Postsurgical changes.  No evidence of bowel distention.   Electronically Signed   By: Marcello Moores  Register   On: 09/26/2013 20:28   Mr Brain W Wo Contrast  09/19/2013   CLINICAL DATA:  Headaches. Newly diagnosed gastric cancer. Head injury on 07/18/2013.  EXAM: MRI HEAD WITHOUT AND WITH CONTRAST  TECHNIQUE: Multiplanar, multiecho pulse sequences of the brain and surrounding structures were obtained without and with intravenous contrast.  CONTRAST:  61mL MULTIHANCE GADOBENATE DIMEGLUMINE 529 MG/ML IV SOLN  COMPARISON:  None.  FINDINGS: There is a 6 mm heterogeneously T1 hyperintense subdural hematoma along the left cerebral convexity. There is mild, diffuse dural enhancement in the left cerebral hemisphere. No right-sided subdural collection or dural thickening/ enhancement is identified.  There is no acute infarct, mass, or midline shift. Brain parenchyma is normal in signal without abnormal enhancement. Ventricles are normal for age.  Orbits are unremarkable. Major intracranial vascular flow voids are preserved.  IMPRESSION: 1. Small subdural hematoma along the left cerebral convexity without significant mass effect. Smooth dural enhancement throughout the left cerebral hemisphere is most likely reactive, without masslike enhancement seen to suggest metastatic disease. This is most likely related to patient's recent trauma. 2. No  evidence of parenchymal brain metastases. Critical Value/emergent results were called by telephone at the time of interpretation on 09/19/2013 at 2:57 pm to Dr. Dillard Essex, who verbally acknowledged these results.   Electronically Signed   By: Logan Bores   On: 09/19/2013 14:57   Mr Lumbar Spine W Wo Contrast  10/11/2013  CLINICAL DATA:  Gastric carcinoma.  Abnormal bone scan at L1.  EXAM: MRI LUMBAR SPINE WITHOUT AND WITH CONTRAST  TECHNIQUE: Multiplanar and multiecho pulse sequences of the lumbar spine were obtained without and with intravenous contrast.  CONTRAST:  15 mL MultiHance IV  COMPARISON:  Bone scan 10/11/2013  FINDINGS: Transitional anatomy.  L5 is partially incorporated into the sacrum.  Negative for fracture or mass lesion. Vertebral body signal is normal with particular attention L1. No evidence of bone marrow edema or mass. No enhancing lesions are seen postcontrast infusion. Etiology of the uptake on the bone scan is unclear.  Negative for fracture. Conus medullaris is normal. No significant degenerative change in the lumbar spine. No disc protrusion or spinal stenosis.  IMPRESSION: Negative for fracture or metastatic disease. Bone marrow signal is normal. No explanation for the bone scan finding at L1.   Electronically Signed   By: Franchot Gallo M.D.   On: 10/11/2013 19:56   Nm Bone Scan Whole Body  10/11/2013   CLINICAL DATA:  Back pain.  Signet cell gastric carcinoma.  EXAM: NUCLEAR MEDICINE WHOLE BODY BONE SCAN  TECHNIQUE: Whole body anterior and posterior images were obtained approximately 3 hours after intravenous injection of radiopharmaceutical.  RADIOPHARMACEUTICALS:  24.5 mCi Technetium-99 MDP  COMPARISON:  CT chest, abdomen, and pelvis 09/13/2013  FINDINGS: There is a small focus of increased radiotracer uptake in the spine slightly left of midline at approximately L1, best seen on the anterior images. Uptake throughout the remainder of the axial skeleton is symmetric. Uptake  involving the left great toe is likely degenerative. Normal uptake is seen in the kidneys with excretion in the bladder.  IMPRESSION: Focal uptake in the spine at approximately L1, with considerations including degenerative change as well as metastasis. Further evaluation with MRI may be helpful.   Electronically Signed   By: Logan Bores   On: 10/11/2013 11:42   Dg Chest Port 1 View  09/30/2013   CLINICAL DATA:  PICC placement.  EXAM: PORTABLE CHEST - 1 VIEW  COMPARISON:  Chest radiograph performed 09/12/2013, and CTA of the chest performed 09/13/2013  FINDINGS: The lungs are well-aerated. Minimal right basilar atelectasis is noted. There is no evidence of pleural effusion or pneumothorax.  The cardiomediastinal silhouette is borderline enlarged. No acute osseous abnormalities are seen. A right PICC is noted ending about the distal SVC.  IMPRESSION: 1. Right PICC noted ending about the distal SVC. 2. Borderline cardiomegaly; minimal right basilar atelectasis noted.   Electronically Signed   By: Garald Balding M.D.   On: 09/30/2013 06:09   Dg Abd Acute W/chest  10/13/2013   CLINICAL DATA:  Abdominal pain and vomiting. Discharged today from a 30 day hospital stay.  EXAM: ACUTE ABDOMEN SERIES (ABDOMEN 2 VIEW & CHEST 1 VIEW)  COMPARISON:  10/11/2013  FINDINGS: Right PICC catheter with tip over the cavoatrial junction. Slightly shallow inspiration. Fibrosis or atelectasis in the right lung base. No focal consolidation. No blunting of costophrenic angles. Normal heart size and pulmonary vascularity.  Postoperative changes in the left upper quadrant with gastrostomy tube present. Residual contrast material in the colon without distention. No small bowel distention. No free intra-abdominal air. No abnormal air-fluid levels. No radiopaque stones. Visualized bones appear intact.  IMPRESSION: Fibrosis or atelectasis in the right lung base. Nonobstructive bowel gas pattern. No free air. Residual contrast material in the  colon.   Electronically Signed   By: Lucienne Capers M.D.   On: 10/13/2013 21:15  Dg Ugi W/water Sol Cm  10/02/2013   CLINICAL DATA:  Status post Billroth 2 for gastric carcinoma 2 weeks ago. Persistent nausea and vomiting.  EXAM: WATER SOLUBLE UPPER GI SERIES  TECHNIQUE: Single-column upper GI series was performed using water soluble contrast. Preprocedure scout film was also performed.  CONTRAST:  121mL OMNIPAQUE IOHEXOL 300 MG/ML  SOLN  COMPARISON:  09/26/2013 acute abdomen series.  CT of 09/13/2013.  FLUOROSCOPY TIME:  2 min and 45 seconds  FINDINGS: Preprocedure scout film demonstrates a left-sided gastrojejunostomy tube. No free intraperitoneal air. Non-obstructive bowel gas pattern.  Focused single-contrast exam, with the patient nearly upright (unable to be positioned supine secondary to discomfort). Normal postoperative appearance of the residual stomach. Contrast traverses the gastrojejunostomy site, without extravasation or obstruction. Normal caliber of proximal small bowel loops.  IMPRESSION: Expected appearance after gastrojejunostomy, without perforation or bowel obstruction.   Electronically Signed   By: Abigail Miyamoto M.D.   On: 10/02/2013 11:46    Microbiology: No results found for this or any previous visit (from the past 240 hour(s)).   Labs: Basic Metabolic Panel:  Recent Labs Lab 10/11/13 0451 10/11/13 0541 10/12/13 0550 10/13/13 0600 10/13/13 2029 10/14/13 0600  NA 136*  --  136* 138 133* 137  K 3.9  --  3.9 3.5* 3.8 3.7  CL 99  --  101 101 96 102  CO2 21  --  25 27 23 25   GLUCOSE 83  --  147* 114* 113* 110*  BUN 6  --  5* 9 8 7   CREATININE 0.77  --  0.73 0.72 0.65 0.72  CALCIUM 8.5  --  8.6 8.6 9.5 8.6  MG  --  1.6 1.5  --   --   --   PHOS  --  3.2 2.8  --   --   --    Liver Function Tests:  Recent Labs Lab 10/11/13 0451 10/12/13 0550 10/13/13 2029 10/14/13 0600  AST 18 13 52* 41*  ALT 48* 35 70* 58*  ALKPHOS 140* 121* 161* 123*  BILITOT 0.3 <0.2* 0.4  0.3  PROT 6.5 6.0 7.8 6.0  ALBUMIN 2.9* 2.7* 3.6 2.7*    Recent Labs Lab 10/13/13 2029  LIPASE 54   No results found for this basename: AMMONIA,  in the last 168 hours CBC:  Recent Labs Lab 10/12/13 0550 10/13/13 2029 10/14/13 0600 10/15/13 0625 10/16/13 0615  WBC 5.7 7.7 6.3 4.2 4.4  NEUTROABS 3.2 5.7  --   --   --   HGB 7.6* 10.1* 8.1* 8.4* 8.1*  HCT 23.8* 31.3* 25.0* 27.0* 25.2*  MCV 80.1 80.1 79.4 81.6 79.2  PLT 340 447* 317 319 299   Cardiac Enzymes: No results found for this basename: CKTOTAL, CKMB, CKMBINDEX, TROPONINI,  in the last 168 hours BNP: BNP (last 3 results) No results found for this basename: PROBNP,  in the last 8760 hours CBG:  Recent Labs Lab 10/13/13 0738 10/13/13 1135 10/15/13 0817 10/16/13 0739 10/17/13 0742  GLUCAP 132* 163* 125* 131* 117*    Time coordinating discharge: Over 30 minutes

## 2013-10-17 NOTE — Progress Notes (Signed)
Discharge instructions reviewed with patient using teach back method and patient demonstrated understanding.  Patient stable for discharge home.  Patient's assessment unchanged from this am.  Home health services arranged and [facility] RN will be out tomorrow to see patient.  Zandra Abts [facility]  10/17/2013  5:04 PM

## 2013-10-17 NOTE — Telephone Encounter (Signed)
clarified genetic counselor appt.

## 2013-10-17 NOTE — Telephone Encounter (Signed)
Spk w/pt advising of updated sch per 08/26 POF, mailed updated sch and sent pt to [facility] nurse vm questions on Genetics Counselling...KJ

## 2013-10-17 NOTE — Progress Notes (Signed)
Pt was sitting up in chair when I arrived. Pt talked a little about her cancer and treatment post discharge. She said she was hoping they would say she would not have to go through treatment. Pt seemed down, compared to previous visits, but said she was tired. Pt confirmed she is having a hard time believing. I provided presence and spt as needed. Pt wanted prayer and appreciated visits. Pls call if additional spt is needed. [place]  10/17/13 1000  Clinical Encounter Type  Visited With Patient

## 2013-10-18 NOTE — Progress Notes (Signed)
Patient OE:[id number] Smith-Golden      DOB: 03-03-1965      GNO:[id number]   Palliative Medicine Team at [facility] Progress Note    Subjective: Paula [name] states that she slept much better last night. She was able to tolerate the oxyir via her tube. She had been a little nauseated and had some vomiting earlier in the evening but none later in the evening early am.  She does not report refluxing when laying down.  We talked about possibly scheduling antinausea meds if she persists with nausea- she could use phenergan syrup via her tube. Patient expressed that she is nervous about possibly getting a porto and having to undergo chemo. She shared that she had started a notebook planning her death plans. She reported she has back off on that because she wants to "live".  I encouraged her to normalize that thought process and work on it a little at a time as we are all going to have to face death sooner or later and at least she would have this done for her sons.  She tends to focus and have anxiety over future events.  I encouraged her to get involved with social worker at the cancer center for outpatient support.  She also reports her faith and family are supporting her right now.     Filed Vitals:   10/17/13 0749  BP: 95/65  Pulse: 91  Temp: 97.9 F (36.6 C)  Resp: 20   Physical exam:  No acute distress . She reports her chronic right rib pain is acting up Lower back ok at present PERRL, EOMI anicteric MMM Chest decreased but clear CVS: regular, S1, S2 VQX:IHWT not tender or distended, PEG in place Ext: warm, trace non pitting edema  Lab Results  Component Value Date   CREATININE 0.72 10/14/2013   BUN 7 10/14/2013   NA 137 10/14/2013   K 3.7 10/14/2013   CL 102 10/14/2013   CO2 25 10/14/2013     Assessment and plan: 48 yr old african [place] female with adenocarcinoma of the stomach, s/p resection with J/G tube for supportive use, and persistent intermittent nausea/vomiting.  Readmitted for same. Patient having pain inssues in back. Reviewed scans with her and suggesting using oxyir for breakthrough in her tube since she is sensitive to tastes which make her nausea worse.  Patient feeling a little better but apprehensive about considering chemo and port.  Sad about limitations and violations of her body.  Worried aobut the future and being a burden.   1.  Full Code at his time.  We did not talk about this but the subject should be broached at some point just to start the conversation. She has already started a notebook related to what she would want towards the end of her life.  I have asked her to talk with the SW at the [facility] for support and help in these matters.  2.  Pain: continue Fentanyl patch with OxyIR via G tube q 4 prn . Can be titrated.  3.  Nausea and vomiting: would switch antinausea meds to liquid via the tube as the patient has issues with the suppository.  4.Obstipation: able to move bowels over night.  Continue Senna via tube.  Time 930- 1000 am  Paula Stauffer L. Lovena Le, MD MBA The Palliative Medicine Team at [facility] Phone: [phone] Pager: [phone] ( Use team phone after hours)

## 2013-10-19 ENCOUNTER — Emergency Department (HOSPITAL_BASED_OUTPATIENT_CLINIC_OR_DEPARTMENT_OTHER): Payer: No Typology Code available for payment source

## 2013-10-19 ENCOUNTER — Emergency Department (HOSPITAL_BASED_OUTPATIENT_CLINIC_OR_DEPARTMENT_OTHER)
Admission: EM | Admit: 2013-10-19 | Discharge: 2013-10-19 | Disposition: A | Discharge status: Home / Self Care | Payer: No Typology Code available for payment source | Attending: Emergency Medicine | Admitting: Emergency Medicine

## 2013-10-19 ENCOUNTER — Encounter (HOSPITAL_BASED_OUTPATIENT_CLINIC_OR_DEPARTMENT_OTHER): Payer: Self-pay | Admitting: Emergency Medicine

## 2013-10-19 DIAGNOSIS — Z79899 Other long term (current) drug therapy: Secondary | ICD-10-CM | POA: Diagnosis not present

## 2013-10-19 DIAGNOSIS — Z87891 Personal history of nicotine dependence: Secondary | ICD-10-CM | POA: Diagnosis not present

## 2013-10-19 DIAGNOSIS — M549 Dorsalgia, unspecified: Secondary | ICD-10-CM | POA: Insufficient documentation

## 2013-10-19 DIAGNOSIS — Z9851 Tubal ligation status: Secondary | ICD-10-CM | POA: Insufficient documentation

## 2013-10-19 DIAGNOSIS — R52 Pain, unspecified: Secondary | ICD-10-CM | POA: Diagnosis not present

## 2013-10-19 DIAGNOSIS — Z9889 Other specified postprocedural states: Secondary | ICD-10-CM | POA: Diagnosis not present

## 2013-10-19 DIAGNOSIS — Z8742 Personal history of other diseases of the female genital tract: Secondary | ICD-10-CM | POA: Diagnosis not present

## 2013-10-19 DIAGNOSIS — R1084 Generalized abdominal pain: Secondary | ICD-10-CM | POA: Diagnosis not present

## 2013-10-19 DIAGNOSIS — G43909 Migraine, unspecified, not intractable, without status migrainosus: Secondary | ICD-10-CM | POA: Insufficient documentation

## 2013-10-19 DIAGNOSIS — R112 Nausea with vomiting, unspecified: Secondary | ICD-10-CM | POA: Diagnosis present

## 2013-10-19 DIAGNOSIS — Z85028 Personal history of other malignant neoplasm of stomach: Secondary | ICD-10-CM | POA: Insufficient documentation

## 2013-10-19 DIAGNOSIS — I10 Essential (primary) hypertension: Secondary | ICD-10-CM | POA: Diagnosis not present

## 2013-10-19 DIAGNOSIS — K219 Gastro-esophageal reflux disease without esophagitis: Secondary | ICD-10-CM | POA: Diagnosis not present

## 2013-10-19 DIAGNOSIS — R109 Unspecified abdominal pain: Secondary | ICD-10-CM

## 2013-10-19 LAB — CBC WITH DIFFERENTIAL/PLATELET
BASOS ABS: 0 10*3/uL (ref 0.0–0.1)
Basophils Relative: 1 % (ref 0–1)
EOS PCT: 5 % (ref 0–5)
Eosinophils Absolute: 0.2 10*3/uL (ref 0.0–0.7)
HCT: 31.3 % — ABNORMAL LOW (ref 36.0–46.0)
Hemoglobin: 10 g/dL — ABNORMAL LOW (ref 12.0–15.0)
LYMPHS PCT: 30 % (ref 12–46)
Lymphs Abs: 1.4 10*3/uL (ref 0.7–4.0)
MCH: 25.7 pg — ABNORMAL LOW (ref 26.0–34.0)
MCHC: 31.9 g/dL (ref 30.0–36.0)
MCV: 80.5 fL (ref 78.0–100.0)
Monocytes Absolute: 0.6 10*3/uL (ref 0.1–1.0)
Monocytes Relative: 14 % — ABNORMAL HIGH (ref 3–12)
NEUTROS ABS: 2.3 10*3/uL (ref 1.7–7.7)
Neutrophils Relative %: 50 % (ref 43–77)
PLATELETS: 329 10*3/uL (ref 150–400)
RBC: 3.89 MIL/uL (ref 3.87–5.11)
RDW: 14.9 % (ref 11.5–15.5)
WBC: 4.5 10*3/uL (ref 4.0–10.5)

## 2013-10-19 LAB — COMPREHENSIVE METABOLIC PANEL
ALT: 89 U/L — AB (ref 0–35)
AST: 36 U/L (ref 0–37)
Albumin: 3.7 g/dL (ref 3.5–5.2)
Alkaline Phosphatase: 137 U/L — ABNORMAL HIGH (ref 39–117)
Anion gap: 15 (ref 5–15)
BUN: 6 mg/dL (ref 6–23)
CALCIUM: 10.1 mg/dL (ref 8.4–10.5)
CO2: 26 meq/L (ref 19–32)
Chloride: 97 mEq/L (ref 96–112)
Creatinine, Ser: 0.9 mg/dL (ref 0.50–1.10)
GFR calc Af Amer: 88 mL/min — ABNORMAL LOW (ref >90)
GFR, EST NON AFRICAN AMERICAN: 76 mL/min — AB (ref >90)
Glucose, Bld: 113 mg/dL — ABNORMAL HIGH (ref 70–99)
Potassium: 4.4 mEq/L (ref 3.7–5.3)
Sodium: 138 mEq/L (ref 137–147)
Total Bilirubin: 0.4 mg/dL (ref 0.3–1.2)
Total Protein: 7.8 g/dL (ref 6.0–8.3)

## 2013-10-19 LAB — LIPASE, BLOOD: Lipase: 50 U/L (ref 11–59)

## 2013-10-19 MED ORDER — HYDROMORPHONE HCL PF 1 MG/ML IJ SOLN
0.5000 mg | Freq: Once | INTRAMUSCULAR | Status: AC
  Administered 2013-10-19: 0.5 mg via INTRAVENOUS
  Filled 2013-10-19: qty 1

## 2013-10-19 MED ORDER — SODIUM CHLORIDE 0.9 % IV SOLN
Freq: Once | INTRAVENOUS | Status: AC
  Administered 2013-10-19: 18:00:00 via INTRAVENOUS

## 2013-10-19 MED ORDER — HYDROMORPHONE HCL PF 1 MG/ML IJ SOLN
1.0000 mg | Freq: Once | INTRAMUSCULAR | Status: AC
  Administered 2013-10-19: 1 mg via INTRAVENOUS
  Filled 2013-10-19: qty 1

## 2013-10-19 MED ORDER — ONDANSETRON HCL 4 MG/2ML IJ SOLN
4.0000 mg | Freq: Once | INTRAMUSCULAR | Status: AC
  Administered 2013-10-19: 4 mg via INTRAVENOUS
  Filled 2013-10-19: qty 2

## 2013-10-19 NOTE — ED Provider Notes (Signed)
CSN: [id number]     Arrival date & time 10/19/13  1703 History  This chart was scribed for Orpah Greek, * by Randa Evens, ED Scribe. This patient was seen in room MH03/MH03 and the patient's care was started at 6:06 PM.    Chief Complaint  Patient presents with  . Emesis   Patient is a 48 y.o. female presenting with vomiting. The history is provided by the patient. No language interpreter was used.  Emesis Associated symptoms: abdominal pain    HPI Comments: Paula [name] is a 48 y.o. female who presents to the Emergency Department complaining of vomiting onset 1 day prior. She states she has associated nausea, abdominal pain and back pain. She states she recently diagnosed with gastric cancer and presents to the ED post surgery. She states she may be having complications from the medicine she was prescribed while in the hospital.   Past Medical History  Diagnosis Date  . Headache(784.0) 2011    Migraines  . Esophageal reflux     On omeprazole  . Costochondritis   . Hypertension     started around 2011  . Ovarian cyst    Past Surgical History  Procedure Laterality Date  . Oophorectomy      Around 1985 left ovary removal  . Tonsillectomy      Around 1994  . Tubal ligation    . Esophagogastroduodenoscopy N/A 09/16/2013    Procedure: ESOPHAGOGASTRODUODENOSCOPY (EGD);  Surgeon: Wonda Horner, MD;  Location: [facility];  Service: Endoscopy;  Laterality: N/A;  . Laparoscopy N/A 09/20/2013    Procedure: DIAGNOSTIC LAPAROSCOPY,DISTAL GASTRECTOMY AND FEEDING GASTROJEJUNOSTOMY;  Surgeon: [name], MD;  Location: [facility];  Service: General;  Laterality: N/A;   Family History  Problem Relation Age of Onset  . Hypertension Mother   . Diabetes Mellitus II Mother   . Hypertension Sister   . Hypertension Brother   . Heart failure Maternal Grandmother   . Hypertension Maternal Grandfather    History  Substance Use Topics  . Smoking status: Former Research scientist (life sciences)  .  Smokeless tobacco: Never Used     Comment: Stopped 2004  . Alcohol Use: Yes     Comment: occ   OB History   Grav Para Term Preterm Abortions TAB SAB Ect Mult Living                 Review of Systems  Gastrointestinal: Positive for nausea, vomiting and abdominal pain.  Musculoskeletal: Positive for back pain.  All other systems reviewed and are negative.   Allergies  Review of patient's allergies indicates no known allergies.  Home Medications   Prior to Admission medications   Medication Sig Start Date End Date Taking? Authorizing Provider  carvedilol (COREG) 6.25 MG tablet Take 6.25 mg by mouth 2 (two) times daily with a meal.    Historical Provider, MD  fentaNYL (DURAGESIC - DOSED MCG/HR) 25 MCG/HR patch Place 1 patch (25 mcg total) onto the skin every 3 (three) days. 10/13/13   Venetia Maxon Rama, MD  FLUoxetine (PROZAC) 20 MG capsule Take 1 capsule (20 mg total) by mouth daily. 08/15/13   Dene Gentry, MD  lidocaine (LIDODERM) 5 % Place 1 patch onto the skin daily. Remove & Discard patch within 12 hours or as directed by MD 10/17/13   Robbie Lis, MD  metoCLOPramide (REGLAN) 5 MG tablet Take 1 tablet (5 mg total) by mouth 4 (four) times daily -  before meals and at bedtime. 10/13/13  Venetia Maxon Rama, MD  Multiple Vitamin (MULTIVITAMIN WITH MINERALS) TABS tablet Take 1 tablet by mouth daily. 10/17/13   Robbie Lis, MD  omeprazole (PRILOSEC) 20 MG capsule Take 1 capsule (20 mg total) by mouth 2 (two) times daily. 09/13/13   Houston Siren III, MD  ondansetron (ZOFRAN ODT) 4 MG disintegrating tablet Take 1 tablet (4 mg total) by mouth every 8 (eight) hours as needed for nausea or vomiting. 10/13/13   Venetia Maxon Rama, MD  oxyCODONE (ROXICODONE) 5 MG/5ML solution Place 5 mLs (5 mg total) into feeding tube every 3 (three) hours as needed for severe pain or breakthrough pain. 10/17/13   Robbie Lis, MD  promethazine (PHENERGAN) 6.25 MG/5ML syrup Take 10 mLs (12.5 mg total) by mouth  every 6 (six) hours as needed for nausea or vomiting. 10/17/13   Robbie Lis, MD  protein supplement (UNJURY CHOCOLATE CLASSIC) POWD Take 27 g (1 packet total) by mouth 2 (two) times daily. 10/17/13   Robbie Lis, MD  sennosides (SENOKOT) 8.8 MG/5ML syrup Place 5 mLs into feeding tube at bedtime. 10/17/13   Robbie Lis, MD  zolpidem (AMBIEN) 5 MG tablet Take 1 tablet (5 mg total) by mouth at bedtime as needed for sleep. 10/17/13   Robbie Lis, MD   Triage Vitals: BP 118/78  Pulse 111  Temp(Src) 98.2 F (36.8 C) (Oral)  Resp 18  Ht 5' (1.524 m)  Wt 125 lb (56.7 kg)  BMI 24.41 kg/m2  SpO2 100%  LMP 09/12/2013  Physical Exam  Constitutional: She is oriented to person, place, and time. She appears well-developed and well-nourished. No distress.  HENT:  Head: Normocephalic and atraumatic.  Right Ear: Hearing normal.  Left Ear: Hearing normal.  Nose: Nose normal.  Mouth/Throat: Oropharynx is clear and moist and mucous membranes are normal.  Eyes: Conjunctivae and EOM are normal. Pupils are equal, round, and reactive to light.  Neck: Normal range of motion. Neck supple.  Cardiovascular: Regular rhythm, S1 normal and S2 normal.  Exam reveals no gallop and no friction rub.   No murmur heard. Pulmonary/Chest: Effort normal and breath sounds normal. No respiratory distress. She exhibits no tenderness.  Abdominal: Soft. Normal appearance and bowel sounds are normal. There is no hepatosplenomegaly. There is generalized tenderness. There is no rebound, no guarding, no tenderness at McBurney's point and negative Murphy's sign. No hernia.  Musculoskeletal: Normal range of motion.  Neurological: She is alert and oriented to person, place, and time. She has normal strength. No cranial nerve deficit or sensory deficit. Coordination normal. GCS eye subscore is 4. GCS verbal subscore is 5. GCS motor subscore is 6.  Skin: Skin is warm, dry and intact. No rash noted. No cyanosis.  Psychiatric: She has  a normal mood and affect. Her speech is normal and behavior is normal. Thought content normal.    ED Course  Procedures (including critical care time) DIAGNOSTIC STUDIES: Oxygen Saturation is 100% on RA, normal by my interpretation.    COORDINATION OF CARE: 6:11 PM-Discussed treatment plan which includes CBC panel, CMP, UA and pain medication with pt at bedside and pt agreed to plan.     Labs Review Labs Reviewed  CBC WITH DIFFERENTIAL - Abnormal; Notable for the following:    Hemoglobin 10.0 (*)    HCT 31.3 (*)    MCH 25.7 (*)    Monocytes Relative 14 (*)    All other components within normal limits  COMPREHENSIVE METABOLIC PANEL -  Abnormal; Notable for the following:    Glucose, Bld 113 (*)    ALT 89 (*)    Alkaline Phosphatase 137 (*)    GFR calc non Af Amer 76 (*)    GFR calc Af Amer 88 (*)    All other components within normal limits  LIPASE, BLOOD  URINALYSIS, ROUTINE W REFLEX MICROSCOPIC    Imaging Review Dg Abd Acute W/chest  10/19/2013   CLINICAL DATA:  48 year old female with abdominal pain and nausea. Recent diagnosis of gastric cancer and surgery.  EXAM: ACUTE ABDOMEN SERIES (ABDOMEN 2 VIEW & CHEST 1 VIEW)  COMPARISON:  10/13/2013 and prior radiographs  FINDINGS: The cardiomediastinal silhouette is unremarkable.  Mild right basilar atelectasis/scarring again identified.  No airspace disease, pleural effusion or pneumothorax.  Surgical sutures overlying the left abdomen noted.  Jejunostomy catheter is again noted.  The bowel gas pattern is unremarkable without evidence of bowel obstruction or pneumoperitoneum.  No suspicious calcifications are identified.  The bony structures are unremarkable.  IMPRESSION: No evidence of acute abnormality.  Normal bowel gas pattern.   Electronically Signed   By: Hassan Rowan M.D.   On: 10/19/2013 19:26     EKG Interpretation None      MDM   Final diagnoses:  Abdominal pain, acute   Patient presents to the ER for evaluation of  abdominal pain. She has a very complicated recent past medical history. The patient recently was diagnosed with gastric cancer and is status post surgical resection. She is pending initiation of chemotherapy. Patient reports onset of nausea and vomiting earlier today. She has had chronic issues of nausea and vomiting, uses Zofran and Phenergan for this. It did not help today, that she was unable to do her pain medication and has had increasing pain to the course of today. Well work is normal. X-ray did not show any evidence of obstruction. Examination is mild tenderness, essentially benign. Patient feeling much better after fluids, antiemetics and pain medication. She will be discharged and will followup as needed.    I personally performed the services described in this documentation, which was scribed in my presence. The recorded information has been reviewed and is accurate.      Orpah Greek, MD 10/19/13 [phone]

## 2013-10-19 NOTE — ED Notes (Signed)
Pt c/o increased nausea since about 2pm today.  No signs of infection noted at insertion site of feeding tube.  Pt able to take fluids and food by mouth and has not vomited "much" today.  Pt c/o mid bilateral back pain and lower left abdominal pain near site of feeding tube.

## 2013-10-19 NOTE — ED Notes (Addendum)
Patient states that she just got out of [facility] with a dx of stomach cancer. She states that today she has been vomiting, took phenergen at home without relief. Also states that she is having pain in her back. Patient has a fentanyl patch placed last night. Requesting dilaudid.

## 2013-10-19 NOTE — Discharge Instructions (Signed)

## 2013-10-20 ENCOUNTER — Emergency Department (HOSPITAL_BASED_OUTPATIENT_CLINIC_OR_DEPARTMENT_OTHER): Payer: No Typology Code available for payment source

## 2013-10-20 ENCOUNTER — Emergency Department (HOSPITAL_BASED_OUTPATIENT_CLINIC_OR_DEPARTMENT_OTHER)
Admission: EM | Admit: 2013-10-20 | Discharge: 2013-10-20 | Disposition: A | Discharge status: Home / Self Care | Payer: No Typology Code available for payment source | Attending: Emergency Medicine | Admitting: Emergency Medicine

## 2013-10-20 ENCOUNTER — Encounter (HOSPITAL_BASED_OUTPATIENT_CLINIC_OR_DEPARTMENT_OTHER): Payer: Self-pay | Admitting: Emergency Medicine

## 2013-10-20 DIAGNOSIS — Z9889 Other specified postprocedural states: Secondary | ICD-10-CM | POA: Diagnosis not present

## 2013-10-20 DIAGNOSIS — Z85028 Personal history of other malignant neoplasm of stomach: Secondary | ICD-10-CM | POA: Insufficient documentation

## 2013-10-20 DIAGNOSIS — R109 Unspecified abdominal pain: Secondary | ICD-10-CM | POA: Insufficient documentation

## 2013-10-20 DIAGNOSIS — K219 Gastro-esophageal reflux disease without esophagitis: Secondary | ICD-10-CM | POA: Diagnosis not present

## 2013-10-20 DIAGNOSIS — Z79899 Other long term (current) drug therapy: Secondary | ICD-10-CM | POA: Insufficient documentation

## 2013-10-20 DIAGNOSIS — Z87891 Personal history of nicotine dependence: Secondary | ICD-10-CM | POA: Insufficient documentation

## 2013-10-20 DIAGNOSIS — Z9851 Tubal ligation status: Secondary | ICD-10-CM | POA: Diagnosis not present

## 2013-10-20 DIAGNOSIS — I10 Essential (primary) hypertension: Secondary | ICD-10-CM | POA: Diagnosis not present

## 2013-10-20 DIAGNOSIS — Z9079 Acquired absence of other genital organ(s): Secondary | ICD-10-CM | POA: Insufficient documentation

## 2013-10-20 DIAGNOSIS — Z8742 Personal history of other diseases of the female genital tract: Secondary | ICD-10-CM | POA: Insufficient documentation

## 2013-10-20 DIAGNOSIS — R1084 Generalized abdominal pain: Secondary | ICD-10-CM | POA: Insufficient documentation

## 2013-10-20 DIAGNOSIS — R112 Nausea with vomiting, unspecified: Secondary | ICD-10-CM | POA: Diagnosis not present

## 2013-10-20 LAB — COMPREHENSIVE METABOLIC PANEL
ALT: 86 U/L — ABNORMAL HIGH (ref 0–35)
AST: 33 U/L (ref 0–37)
Albumin: 4.1 g/dL (ref 3.5–5.2)
Alkaline Phosphatase: 147 U/L — ABNORMAL HIGH (ref 39–117)
Anion gap: 17 — ABNORMAL HIGH (ref 5–15)
BUN: 6 mg/dL (ref 6–23)
CO2: 26 meq/L (ref 19–32)
CREATININE: 0.9 mg/dL (ref 0.50–1.10)
Calcium: 10.4 mg/dL (ref 8.4–10.5)
Chloride: 98 mEq/L (ref 96–112)
GFR, EST AFRICAN AMERICAN: 88 mL/min — AB (ref >90)
GFR, EST NON AFRICAN AMERICAN: 76 mL/min — AB (ref >90)
GLUCOSE: 123 mg/dL — AB (ref 70–99)
Potassium: 4.3 mEq/L (ref 3.7–5.3)
Sodium: 141 mEq/L (ref 137–147)
Total Bilirubin: 0.5 mg/dL (ref 0.3–1.2)
Total Protein: 8.5 g/dL — ABNORMAL HIGH (ref 6.0–8.3)

## 2013-10-20 LAB — CBC WITH DIFFERENTIAL/PLATELET
Basophils Absolute: 0 10*3/uL (ref 0.0–0.1)
Basophils Relative: 0 % (ref 0–1)
EOS PCT: 2 % (ref 0–5)
Eosinophils Absolute: 0.1 10*3/uL (ref 0.0–0.7)
HEMATOCRIT: 33.5 % — AB (ref 36.0–46.0)
Hemoglobin: 10.7 g/dL — ABNORMAL LOW (ref 12.0–15.0)
LYMPHS ABS: 1.4 10*3/uL (ref 0.7–4.0)
LYMPHS PCT: 26 % (ref 12–46)
MCH: 25.7 pg — ABNORMAL LOW (ref 26.0–34.0)
MCHC: 31.9 g/dL (ref 30.0–36.0)
MCV: 80.5 fL (ref 78.0–100.0)
MONO ABS: 0.5 10*3/uL (ref 0.1–1.0)
Monocytes Relative: 9 % (ref 3–12)
Neutro Abs: 3.4 10*3/uL (ref 1.7–7.7)
Neutrophils Relative %: 63 % (ref 43–77)
Platelets: 353 10*3/uL (ref 150–400)
RBC: 4.16 MIL/uL (ref 3.87–5.11)
RDW: 14.9 % (ref 11.5–15.5)
WBC: 5.4 10*3/uL (ref 4.0–10.5)

## 2013-10-20 MED ORDER — HYDROMORPHONE HCL PF 1 MG/ML IJ SOLN
1.0000 mg | INTRAMUSCULAR | Status: AC | PRN
  Administered 2013-10-20 (×2): 1 mg via INTRAVENOUS
  Filled 2013-10-20 (×2): qty 1

## 2013-10-20 MED ORDER — PROMETHAZINE HCL 25 MG/ML IJ SOLN
12.5000 mg | Freq: Once | INTRAMUSCULAR | Status: AC
  Administered 2013-10-20: 12.5 mg via INTRAVENOUS
  Filled 2013-10-20: qty 1

## 2013-10-20 MED ORDER — SCOPOLAMINE 1 MG/3DAYS TD PT72: 1.0000 | MEDICATED_PATCH | TRANSDERMAL | Status: DC | PRN

## 2013-10-20 MED ORDER — SODIUM CHLORIDE 0.9 % IV BOLUS (SEPSIS)
1000.0000 mL | Freq: Once | INTRAVENOUS | Status: AC
  Administered 2013-10-20: 1000 mL via INTRAVENOUS

## 2013-10-20 MED ORDER — SODIUM CHLORIDE 0.9 % IV SOLN: Freq: Once | INTRAVENOUS | Status: DC

## 2013-10-20 MED ORDER — ONDANSETRON HCL 4 MG/2ML IJ SOLN
4.0000 mg | Freq: Once | INTRAMUSCULAR | Status: AC
  Administered 2013-10-20: 4 mg via INTRAVENOUS
  Filled 2013-10-20: qty 2

## 2013-10-20 NOTE — ED Notes (Signed)
C/o abd pain and emesis, reports feeling better when she left yesterday but having more pain after "eating something last night."

## 2013-10-20 NOTE — Discharge Instructions (Signed)

## 2013-10-21 ENCOUNTER — Other Ambulatory Visit (HOSPITAL_COMMUNITY)
Admission: RE | Admit: 2013-10-21 | Discharge: 2013-10-21 | Disposition: A | Discharge status: Home / Self Care | Payer: No Typology Code available for payment source | Source: Ambulatory Visit | Attending: Internal Medicine | Admitting: Internal Medicine

## 2013-10-21 ENCOUNTER — Encounter (HOSPITAL_COMMUNITY): Payer: Self-pay | Admitting: Emergency Medicine

## 2013-10-21 ENCOUNTER — Emergency Department (HOSPITAL_COMMUNITY): Payer: No Typology Code available for payment source

## 2013-10-21 ENCOUNTER — Emergency Department (HOSPITAL_COMMUNITY)
Admission: EM | Admit: 2013-10-21 | Discharge: 2013-10-21 | Disposition: A | Discharge status: Home / Self Care | Payer: No Typology Code available for payment source | Attending: Emergency Medicine | Admitting: Emergency Medicine

## 2013-10-21 DIAGNOSIS — C169 Malignant neoplasm of stomach, unspecified: Secondary | ICD-10-CM | POA: Diagnosis present

## 2013-10-21 DIAGNOSIS — R1010 Upper abdominal pain, unspecified: Secondary | ICD-10-CM

## 2013-10-21 DIAGNOSIS — Z8742 Personal history of other diseases of the female genital tract: Secondary | ICD-10-CM | POA: Diagnosis not present

## 2013-10-21 DIAGNOSIS — I10 Essential (primary) hypertension: Secondary | ICD-10-CM | POA: Diagnosis not present

## 2013-10-21 DIAGNOSIS — Z85028 Personal history of other malignant neoplasm of stomach: Secondary | ICD-10-CM | POA: Insufficient documentation

## 2013-10-21 DIAGNOSIS — R109 Unspecified abdominal pain: Secondary | ICD-10-CM | POA: Diagnosis not present

## 2013-10-21 DIAGNOSIS — Z9889 Other specified postprocedural states: Secondary | ICD-10-CM | POA: Diagnosis not present

## 2013-10-21 DIAGNOSIS — Z79899 Other long term (current) drug therapy: Secondary | ICD-10-CM | POA: Insufficient documentation

## 2013-10-21 DIAGNOSIS — K219 Gastro-esophageal reflux disease without esophagitis: Secondary | ICD-10-CM | POA: Diagnosis not present

## 2013-10-21 DIAGNOSIS — R112 Nausea with vomiting, unspecified: Secondary | ICD-10-CM | POA: Insufficient documentation

## 2013-10-21 DIAGNOSIS — C772 Secondary and unspecified malignant neoplasm of intra-abdominal lymph nodes: Secondary | ICD-10-CM | POA: Diagnosis not present

## 2013-10-21 DIAGNOSIS — M549 Dorsalgia, unspecified: Secondary | ICD-10-CM | POA: Insufficient documentation

## 2013-10-21 DIAGNOSIS — Z87891 Personal history of nicotine dependence: Secondary | ICD-10-CM | POA: Insufficient documentation

## 2013-10-21 HISTORY — DX: Malignant neoplasm of stomach, unspecified: C16.9

## 2013-10-21 LAB — CBC WITH DIFFERENTIAL/PLATELET
BASOS ABS: 0 10*3/uL (ref 0.0–0.1)
Basophils Relative: 0 % (ref 0–1)
EOS ABS: 0 10*3/uL (ref 0.0–0.7)
Eosinophils Relative: 0 % (ref 0–5)
HCT: 32.6 % — ABNORMAL LOW (ref 36.0–46.0)
Hemoglobin: 10.3 g/dL — ABNORMAL LOW (ref 12.0–15.0)
LYMPHS ABS: 0.8 10*3/uL (ref 0.7–4.0)
LYMPHS PCT: 19 % (ref 12–46)
MCH: 25.1 pg — ABNORMAL LOW (ref 26.0–34.0)
MCHC: 31.6 g/dL (ref 30.0–36.0)
MCV: 79.5 fL (ref 78.0–100.0)
Monocytes Absolute: 0.2 10*3/uL (ref 0.1–1.0)
Monocytes Relative: 4 % (ref 3–12)
NEUTROS PCT: 77 % (ref 43–77)
Neutro Abs: 3.4 10*3/uL (ref 1.7–7.7)
PLATELETS: 382 10*3/uL (ref 150–400)
RBC: 4.1 MIL/uL (ref 3.87–5.11)
RDW: 15 % (ref 11.5–15.5)
WBC: 4.4 10*3/uL (ref 4.0–10.5)

## 2013-10-21 LAB — URINALYSIS, ROUTINE W REFLEX MICROSCOPIC
Bilirubin Urine: NEGATIVE
Glucose, UA: NEGATIVE mg/dL
HGB URINE DIPSTICK: NEGATIVE
Ketones, ur: >80 mg/dL — AB
Leukocytes, UA: NEGATIVE
Nitrite: NEGATIVE
PROTEIN: NEGATIVE mg/dL
SPECIFIC GRAVITY, URINE: 1.019 (ref 1.005–1.030)
Urobilinogen, UA: 1 mg/dL (ref 0.0–1.0)
pH: 6 (ref 5.0–8.0)

## 2013-10-21 LAB — COMPREHENSIVE METABOLIC PANEL WITH GFR
ALT: 82 U/L — ABNORMAL HIGH (ref 0–35)
AST: 43 U/L — ABNORMAL HIGH (ref 0–37)
Albumin: 3.8 g/dL (ref 3.5–5.2)
Alkaline Phosphatase: 140 U/L — ABNORMAL HIGH (ref 39–117)
Anion gap: 16 — ABNORMAL HIGH (ref 5–15)
BUN: 6 mg/dL (ref 6–23)
CO2: 24 meq/L (ref 19–32)
Calcium: 9.5 mg/dL (ref 8.4–10.5)
Chloride: 96 meq/L (ref 96–112)
Creatinine, Ser: 0.82 mg/dL (ref 0.50–1.10)
GFR calc Af Amer: >90 mL/min (ref >90)
GFR calc non Af Amer: 85 mL/min — ABNORMAL LOW (ref >90)
Glucose, Bld: 128 mg/dL — ABNORMAL HIGH (ref 70–99)
Potassium: 4.3 meq/L (ref 3.7–5.3)
Sodium: 136 meq/L — ABNORMAL LOW (ref 137–147)
Total Bilirubin: 0.6 mg/dL (ref 0.3–1.2)
Total Protein: 8 g/dL (ref 6.0–8.3)

## 2013-10-21 LAB — TROPONIN I: Troponin I: <0.3 ng/mL (ref <0.30)

## 2013-10-21 LAB — LIPASE, BLOOD: Lipase: 35 U/L (ref 11–59)

## 2013-10-21 MED ORDER — SODIUM CHLORIDE 0.9 % IV BOLUS (SEPSIS)
1000.0000 mL | Freq: Once | INTRAVENOUS | Status: AC
  Administered 2013-10-21: 1000 mL via INTRAVENOUS

## 2013-10-21 MED ORDER — ONDANSETRON 8 MG PO TBDP: ORAL_TABLET | ORAL | Status: DC

## 2013-10-21 MED ORDER — ONDANSETRON HCL 4 MG/2ML IJ SOLN
4.0000 mg | Freq: Once | INTRAMUSCULAR | Status: AC
  Administered 2013-10-21: 4 mg via INTRAVENOUS
  Filled 2013-10-21: qty 2

## 2013-10-21 MED ORDER — HYDROMORPHONE HCL PF 1 MG/ML IJ SOLN
1.0000 mg | Freq: Once | INTRAMUSCULAR | Status: AC
  Administered 2013-10-21: 1 mg via INTRAVENOUS
  Filled 2013-10-21: qty 1

## 2013-10-21 MED ORDER — FENTANYL 50 MCG/HR TD PT72: 50.0000 ug | MEDICATED_PATCH | TRANSDERMAL | Status: DC

## 2013-10-21 NOTE — ED Notes (Signed)
Bed: WA17 Expected date:  Expected time:  Means of arrival:  Comments: EMS 

## 2013-10-21 NOTE — ED Provider Notes (Addendum)
CSN: [id number]     Arrival date & time 10/21/13  1134 History   First MD Initiated Contact with Patient 10/21/13 1200     Chief Complaint  Patient presents with  . Abdominal Pain  . Nausea     (Consider location/radiation/quality/duration/timing/severity/associated sxs/prior Treatment) HPI Comments: Patient presents with abdominal pain back pain and vomiting. 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 This was in July. She had a gastric resection and a GJ tube placed. She's had ongoing pain in her abdomen with associated vomiting. She's currently awaiting appointment with an oncologist for therapy recommendations. She's not currently receiving any chemotherapy or radiation. She states that she's had ongoing pain in her upper abdomen and back. This is unchanged and she feels like it's more sore from vomiting. She's had ongoing nausea and vomiting. She denies any fevers. She denies any urinary symptoms. She's been here twice before this week for similar symptoms.  Patient is a 48 y.o. female presenting with abdominal pain.  Abdominal Pain Associated symptoms: fatigue, nausea and vomiting   Associated symptoms: no chest pain, no chills, no cough, no diarrhea, no fever, no hematuria and no shortness of breath     Past Medical History  Diagnosis Date  . Headache(784.0) 2011    Migraines  . Esophageal reflux     On omeprazole  . Costochondritis   . Hypertension     started around 2011  . Ovarian cyst   . Stomach cancer    Past Surgical History  Procedure Laterality Date  . Oophorectomy      Around 1985 left ovary removal  . Tonsillectomy      Around 1994  . Tubal ligation    . Esophagogastroduodenoscopy N/A 09/16/2013    Procedure: ESOPHAGOGASTRODUODENOSCOPY (EGD);  Surgeon: Wonda Horner, MD;  Location: [facility];  Service: Endoscopy;  Laterality: N/A;  . Laparoscopy N/A 09/20/2013    Procedure: DIAGNOSTIC LAPAROSCOPY,DISTAL GASTRECTOMY AND FEEDING  GASTROJEJUNOSTOMY;  Surgeon: [name], MD;  Location: [facility];  Service: General;  Laterality: N/A;   Family History  Problem Relation Age of Onset  . Hypertension Mother   . Diabetes Mellitus II Mother   . Hypertension Sister   . Hypertension Brother   . Heart failure Maternal Grandmother   . Hypertension Maternal Grandfather    History  Substance Use Topics  . Smoking status: Former Research scientist (life sciences)  . Smokeless tobacco: Never Used     Comment: Stopped 2004  . Alcohol Use: Yes     Comment: occ   OB History   Grav Para Term Preterm Abortions TAB SAB Ect Mult Living                 Review of Systems  Constitutional: Positive for fatigue. Negative for fever, chills and diaphoresis.  HENT: Negative for congestion, rhinorrhea and sneezing.   Eyes: Negative.   Respiratory: Negative for cough, chest tightness and shortness of breath.   Cardiovascular: Negative for chest pain and leg swelling.  Gastrointestinal: Positive for nausea, vomiting and abdominal pain. Negative for diarrhea and blood in stool.  Genitourinary: Negative for frequency, hematuria, flank pain and difficulty urinating.  Musculoskeletal: Positive for back pain. Negative for arthralgias.  Skin: Negative for rash.  Neurological: Negative for dizziness, speech difficulty, weakness, numbness and headaches.      Allergies  Review of patient's allergies indicates no known allergies.  Home Medications   Prior to Admission medications   Medication Sig Start Date End Date Taking? Authorizing Provider  carvedilol (COREG) 6.25 MG tablet Take 6.25 mg by mouth 2 (two) times daily with a meal.   Yes Historical Provider, MD  FLUoxetine (PROZAC) 20 MG capsule Take 20 mg by mouth daily.   Yes Historical Provider, MD  lidocaine (LIDODERM) 5 % Place 1 patch onto the skin daily. Remove & Discard patch within 12 hours or as directed by MD   Yes Historical Provider, MD  metoCLOPramide (REGLAN) 5 MG tablet Take 5 mg by mouth 4 (four)  times daily.   Yes Historical Provider, MD  Multiple Vitamins-Minerals (MULTIVITAMIN & MINERAL PO) Take 1 tablet by mouth daily.   Yes Historical Provider, MD  omeprazole (PRILOSEC) 20 MG capsule Take 20 mg by mouth 2 (two) times daily before a meal.   Yes Historical Provider, MD  oxyCODONE (ROXICODONE) 5 MG/5ML solution Take 5 mg by mouth every 3 (three) hours as needed for severe pain or breakthrough pain.   Yes Historical Provider, MD  promethazine (PHENERGAN) 6.25 MG/5ML syrup Take 12.5 mg by mouth every 6 (six) hours as needed for nausea or vomiting.   Yes Historical Provider, MD  scopolamine (TRANSDERM-SCOP) 1 MG/3DAYS Place 1 patch onto the skin every 3 (three) days.   Yes Historical Provider, MD  sennosides (SENOKOT) 8.8 MG/5ML syrup Take 5 mLs by mouth at bedtime.   Yes Historical Provider, MD  zolpidem (AMBIEN) 10 MG tablet Take 10 mg by mouth at bedtime as needed for sleep.   Yes Historical Provider, MD  fentaNYL (DURAGESIC) 50 MCG/HR Place 1 patch (50 mcg total) onto the skin every 3 (three) days. 10/21/13   Malvin Johns, MD  ondansetron (ZOFRAN ODT) 8 MG disintegrating tablet 8mg  ODT q4 hours prn nausea 10/21/13   Malvin Johns, MD  protein supplement (UNJURY CHOCOLATE CLASSIC) POWD Take 27 g (1 packet total) by mouth 2 (two) times daily. 10/17/13   Robbie Lis, MD   BP 117/66  Pulse 87  Temp(Src) 97.8 F (36.6 C) (Oral)  Resp 20  SpO2 100%  LMP 09/12/2013 Physical Exam  Constitutional: She is oriented to person, place, and time. She appears well-developed and well-nourished.  HENT:  Head: Normocephalic and atraumatic.  Eyes: Pupils are equal, round, and reactive to light.  Neck: Normal range of motion. Neck supple.  Cardiovascular: Normal rate, regular rhythm and normal heart sounds.   Pulmonary/Chest: Effort normal and breath sounds normal. No respiratory distress. She has no wheezes. She has no rales. She exhibits no tenderness.  Abdominal: Soft. Bowel sounds are normal.  There is tenderness (moderate TTP across the upper abd). There is no rebound and no guarding.  Musculoskeletal: Normal range of motion. She exhibits no edema.  Lymphadenopathy:    She has no cervical adenopathy.  Neurological: She is alert and oriented to person, place, and time.  Skin: Skin is warm and dry. No rash noted.  Psychiatric: She has a normal mood and affect.    ED Course  Procedures (including critical care time) Labs Review Results for orders placed during the hospital encounter of 10/21/13  CBC WITH DIFFERENTIAL      Result Value Ref Range   WBC 4.4  4.0 - 10.5 K/uL   RBC 4.10  3.87 - 5.11 MIL/uL   Hemoglobin 10.3 (*) 12.0 - 15.0 g/dL   HCT 32.6 (*) 36.0 - 46.0 %   MCV 79.5  78.0 - 100.0 fL   MCH 25.1 (*) 26.0 - 34.0 pg   MCHC 31.6  30.0 - 36.0 g/dL  RDW 15.0  11.5 - 15.5 %   Platelets 382  150 - 400 K/uL   Neutrophils Relative % 77  43 - 77 %   Neutro Abs 3.4  1.7 - 7.7 K/uL   Lymphocytes Relative 19  12 - 46 %   Lymphs Abs 0.8  0.7 - 4.0 K/uL   Monocytes Relative 4  3 - 12 %   Monocytes Absolute 0.2  0.1 - 1.0 K/uL   Eosinophils Relative 0  0 - 5 %   Eosinophils Absolute 0.0  0.0 - 0.7 K/uL   Basophils Relative 0  0 - 1 %   Basophils Absolute 0.0  0.0 - 0.1 K/uL  COMPREHENSIVE METABOLIC PANEL      Result Value Ref Range   Sodium 136 (*) 137 - 147 mEq/L   Potassium 4.3  3.7 - 5.3 mEq/L   Chloride 96  96 - 112 mEq/L   CO2 24  19 - 32 mEq/L   Glucose, Bld 128 (*) 70 - 99 mg/dL   BUN 6  6 - 23 mg/dL   Creatinine, Ser 0.82  0.50 - 1.10 mg/dL   Calcium 9.5  8.4 - 10.5 mg/dL   Total Protein 8.0  6.0 - 8.3 g/dL   Albumin 3.8  3.5 - 5.2 g/dL   AST 43 (*) 0 - 37 U/L   ALT 82 (*) 0 - 35 U/L   Alkaline Phosphatase 140 (*) 39 - 117 U/L   Total Bilirubin 0.6  0.3 - 1.2 mg/dL   GFR calc non Af Amer 85 (*) >90 mL/min   GFR calc Af Amer >90  >90 mL/min   Anion gap 16 (*) 5 - 15  LIPASE, BLOOD      Result Value Ref Range   Lipase 35  11 - 59 U/L  URINALYSIS,  ROUTINE W REFLEX MICROSCOPIC      Result Value Ref Range   Color, Urine YELLOW  YELLOW   APPearance CLEAR  CLEAR   Specific Gravity, Urine 1.019  1.005 - 1.030   pH 6.0  5.0 - 8.0   Glucose, UA NEGATIVE  NEGATIVE mg/dL   Hgb urine dipstick NEGATIVE  NEGATIVE   Bilirubin Urine NEGATIVE  NEGATIVE   Ketones, ur >80 (*) NEGATIVE mg/dL   Protein, ur NEGATIVE  NEGATIVE mg/dL   Urobilinogen, UA 1.0  0.0 - 1.0 mg/dL   Nitrite NEGATIVE  NEGATIVE   Leukocytes, UA NEGATIVE  NEGATIVE  TROPONIN I      Result Value Ref Range   Troponin I <0.30  <0.30 ng/mL   Dg Abd 1 View  10/11/2013   CLINICAL DATA:  Intolerance of jejunostomy tube feedings.  EXAM: ABDOMEN - 1 VIEW  COMPARISON:  Abdomen series 8/19 2015.  FINDINGS: Surgical sutures left upper quadrant. Small amount of residual contrast in the colon from prior studies. Jejunostomy tube was injected with nonionic contrast. Jejunostomy tube is widely patent. Proximal small bowel is nondilated.  IMPRESSION: 1. Patent jejunostomy tube.  Proximal small bowel nondilated. 2. No acute abnormality.   Electronically Signed   By: Marcello Moores  Register   On: 10/11/2013 12:11   Dg Abd 1 View  10/09/2013   CLINICAL DATA:  Fecal impaction.  EXAM: ABDOMEN - 1 VIEW  COMPARISON:  September 26, 2013.  FINDINGS: Residual contrast is noted throughout the colon. There is no evidence of bowel obstruction or ileus. Gastrostomy tube is noted in the left upper quadrant. Phleboliths are noted in the pelvis.  IMPRESSION: No  evidence of bowel obstruction or ileus. Residual contrast is noted throughout the colon.   Electronically Signed   By: Sabino Dick M.D.   On: 10/09/2013 15:24   Dg Abd 1 View  09/26/2013   CLINICAL DATA:  Postop ileus.  EXAM: ABDOMEN - 1 VIEW  COMPARISON:  No prior.  FINDINGS: Soft tissue structures are unremarkable. Surgical tubing noted over the right abdomen. Surgical staples and clips noted in the abdomen. Gas pattern is nonspecific. No free air. No acute bony  abnormality. Lung bases are clear.  IMPRESSION: Postsurgical changes.  No evidence of bowel distention.   Electronically Signed   By: Marcello Moores  Register   On: 09/26/2013 20:28   Mr Lumbar Spine W Wo Contrast  10/11/2013   CLINICAL DATA:  Gastric carcinoma.  Abnormal bone scan at L1.  EXAM: MRI LUMBAR SPINE WITHOUT AND WITH CONTRAST  TECHNIQUE: Multiplanar and multiecho pulse sequences of the lumbar spine were obtained without and with intravenous contrast.  CONTRAST:  15 mL MultiHance IV  COMPARISON:  Bone scan 10/11/2013  FINDINGS: Transitional anatomy.  L5 is partially incorporated into the sacrum.  Negative for fracture or mass lesion. Vertebral body signal is normal with particular attention L1. No evidence of bone marrow edema or mass. No enhancing lesions are seen postcontrast infusion. Etiology of the uptake on the bone scan is unclear.  Negative for fracture. Conus medullaris is normal. No significant degenerative change in the lumbar spine. No disc protrusion or spinal stenosis.  IMPRESSION: Negative for fracture or metastatic disease. Bone marrow signal is normal. No explanation for the bone scan finding at L1.   Electronically Signed   By: Franchot Gallo M.D.   On: 10/11/2013 19:56   Nm Bone Scan Whole Body  10/11/2013   CLINICAL DATA:  Back pain.  Signet cell gastric carcinoma.  EXAM: NUCLEAR MEDICINE WHOLE BODY BONE SCAN  TECHNIQUE: Whole body anterior and posterior images were obtained approximately 3 hours after intravenous injection of radiopharmaceutical.  RADIOPHARMACEUTICALS:  24.5 mCi Technetium-99 MDP  COMPARISON:  CT chest, abdomen, and pelvis 09/13/2013  FINDINGS: There is a small focus of increased radiotracer uptake in the spine slightly left of midline at approximately L1, best seen on the anterior images. Uptake throughout the remainder of the axial skeleton is symmetric. Uptake involving the left great toe is likely degenerative. Normal uptake is seen in the kidneys with excretion in  the bladder.  IMPRESSION: Focal uptake in the spine at approximately L1, with considerations including degenerative change as well as metastasis. Further evaluation with MRI may be helpful.   Electronically Signed   By: Logan Bores   On: 10/11/2013 11:42   Dg Chest Port 1 View  09/30/2013   CLINICAL DATA:  PICC placement.  EXAM: PORTABLE CHEST - 1 VIEW  COMPARISON:  Chest radiograph performed 09/12/2013, and CTA of the chest performed 09/13/2013  FINDINGS: The lungs are well-aerated. Minimal right basilar atelectasis is noted. There is no evidence of pleural effusion or pneumothorax.  The cardiomediastinal silhouette is borderline enlarged. No acute osseous abnormalities are seen. A right PICC is noted ending about the distal SVC.  IMPRESSION: 1. Right PICC noted ending about the distal SVC. 2. Borderline cardiomegaly; minimal right basilar atelectasis noted.   Electronically Signed   By: Garald Balding M.D.   On: 09/30/2013 06:09   Dg Abd Acute W/chest  10/21/2013   CLINICAL DATA:  Gastric carcinoma. Abdominal pain. Vomiting. Cough.  EXAM: ACUTE ABDOMEN SERIES (ABDOMEN 2 VIEW &  CHEST 1 VIEW)  COMPARISON:  None.  FINDINGS: Percutaneous gastrostomy tube again seen in left upper quadrant. Surgical clips and staples noted. No evidence of dilated bowel loops or free air.  Mild elevation of right hemidiaphragm and right basilar pleural- parenchymal scarring appears stable. No evidence of acute infiltrate or edema. No evidence of pleural effusion. Heart size is within normal limits. No mass or lymphadenopathy identified.  IMPRESSION: Unremarkable bowel gas pattern.  No acute findings.  Stable right basilar pleural- parenchymal scarring. No active lung disease.   Electronically Signed   By: Earle Gell M.D.   On: 10/21/2013 14:22   Dg Abd Acute W/chest  10/20/2013   CLINICAL DATA:  Worsening abdominal pain after recent discharge. Recent diagnosis of gastric cancer.  EXAM: ACUTE ABDOMEN SERIES (ABDOMEN 2 VIEW &  CHEST 1 VIEW)  COMPARISON:  Acute abdominal series 10/19/2013  FINDINGS: Similar appearance of right basilar scarring. Otherwise, the lungs are clear. Stable cardiac and mediastinal contours. No free air. Percutaneous gastrostomy tube appears in similar position and projects over the stomach. Staple lines are noted in the left upper quadrant suggesting partial gastric resection. No evidence of ascites or bowel obstruction. No acute osseous abnormality.  IMPRESSION: 1. No acute cardiopulmonary process. 2. Stable right basilar atelectasis versus scarring. 3. Stable position of gastrostomy tube. 4. Nonobstructed bowel gas pattern. 5. No evidence of free air or ascites.   Electronically Signed   By: Jacqulynn Cadet M.D.   On: 10/20/2013 14:54   Dg Abd Acute W/chest  10/19/2013   CLINICAL DATA:  48 year old female with abdominal pain and nausea. Recent diagnosis of gastric cancer and surgery.  EXAM: ACUTE ABDOMEN SERIES (ABDOMEN 2 VIEW & CHEST 1 VIEW)  COMPARISON:  10/13/2013 and prior radiographs  FINDINGS: The cardiomediastinal silhouette is unremarkable.  Mild right basilar atelectasis/scarring again identified.  No airspace disease, pleural effusion or pneumothorax.  Surgical sutures overlying the left abdomen noted.  Jejunostomy catheter is again noted.  The bowel gas pattern is unremarkable without evidence of bowel obstruction or pneumoperitoneum.  No suspicious calcifications are identified.  The bony structures are unremarkable.  IMPRESSION: No evidence of acute abnormality.  Normal bowel gas pattern.   Electronically Signed   By: Hassan Rowan M.D.   On: 10/19/2013 19:26   Dg Abd Acute W/chest  10/13/2013   CLINICAL DATA:  Abdominal pain and vomiting. Discharged today from a 30 day hospital stay.  EXAM: ACUTE ABDOMEN SERIES (ABDOMEN 2 VIEW & CHEST 1 VIEW)  COMPARISON:  10/11/2013  FINDINGS: Right PICC catheter with tip over the cavoatrial junction. Slightly shallow inspiration. Fibrosis or atelectasis in the  right lung base. No focal consolidation. No blunting of costophrenic angles. Normal heart size and pulmonary vascularity.  Postoperative changes in the left upper quadrant with gastrostomy tube present. Residual contrast material in the colon without distention. No small bowel distention. No free intra-abdominal air. No abnormal air-fluid levels. No radiopaque stones. Visualized bones appear intact.  IMPRESSION: Fibrosis or atelectasis in the right lung base. Nonobstructive bowel gas pattern. No free air. Residual contrast material in the colon.   Electronically Signed   By: Lucienne Capers M.D.   On: 10/13/2013 21:15   Dg Ugi W/water Sol Cm  10/02/2013   CLINICAL DATA:  Status post Billroth 2 for gastric carcinoma 2 weeks ago. Persistent nausea and vomiting.  EXAM: WATER SOLUBLE UPPER GI SERIES  TECHNIQUE: Single-column upper GI series was performed using water soluble contrast. Preprocedure scout film was also  performed.  CONTRAST:  116mL OMNIPAQUE IOHEXOL 300 MG/ML  SOLN  COMPARISON:  09/26/2013 acute abdomen series.  CT of 09/13/2013.  FLUOROSCOPY TIME:  2 min and 45 seconds  FINDINGS: Preprocedure scout film demonstrates a left-sided gastrojejunostomy tube. No free intraperitoneal air. Non-obstructive bowel gas pattern.  Focused single-contrast exam, with the patient nearly upright (unable to be positioned supine secondary to discomfort). Normal postoperative appearance of the residual stomach. Contrast traverses the gastrojejunostomy site, without extravasation or obstruction. Normal caliber of proximal small bowel loops.  IMPRESSION: Expected appearance after gastrojejunostomy, without perforation or bowel obstruction.   Electronically Signed   By: Abigail Miyamoto M.D.   On: 10/02/2013 11:46      Imaging Review Dg Abd Acute W/chest  10/21/2013   CLINICAL DATA:  Gastric carcinoma. Abdominal pain. Vomiting. Cough.  EXAM: ACUTE ABDOMEN SERIES (ABDOMEN 2 VIEW & CHEST 1 VIEW)  COMPARISON:  None.  FINDINGS:  Percutaneous gastrostomy tube again seen in left upper quadrant. Surgical clips and staples noted. No evidence of dilated bowel loops or free air.  Mild elevation of right hemidiaphragm and right basilar pleural- parenchymal scarring appears stable. No evidence of acute infiltrate or edema. No evidence of pleural effusion. Heart size is within normal limits. No mass or lymphadenopathy identified.  IMPRESSION: Unremarkable bowel gas pattern.  No acute findings.  Stable right basilar pleural- parenchymal scarring. No active lung disease.   Electronically Signed   By: Earle Gell M.D.   On: 10/21/2013 14:22   Dg Abd Acute W/chest  10/20/2013   CLINICAL DATA:  Worsening abdominal pain after recent discharge. Recent diagnosis of gastric cancer.  EXAM: ACUTE ABDOMEN SERIES (ABDOMEN 2 VIEW & CHEST 1 VIEW)  COMPARISON:  Acute abdominal series 10/19/2013  FINDINGS: Similar appearance of right basilar scarring. Otherwise, the lungs are clear. Stable cardiac and mediastinal contours. No free air. Percutaneous gastrostomy tube appears in similar position and projects over the stomach. Staple lines are noted in the left upper quadrant suggesting partial gastric resection. No evidence of ascites or bowel obstruction. No acute osseous abnormality.  IMPRESSION: 1. No acute cardiopulmonary process. 2. Stable right basilar atelectasis versus scarring. 3. Stable position of gastrostomy tube. 4. Nonobstructed bowel gas pattern. 5. No evidence of free air or ascites.   Electronically Signed   By: Jacqulynn Cadet M.D.   On: 10/20/2013 14:54   Dg Abd Acute W/chest  10/19/2013   CLINICAL DATA:  48 year old female with abdominal pain and nausea. Recent diagnosis of gastric cancer and surgery.  EXAM: ACUTE ABDOMEN SERIES (ABDOMEN 2 VIEW & CHEST 1 VIEW)  COMPARISON:  10/13/2013 and prior radiographs  FINDINGS: The cardiomediastinal silhouette is unremarkable.  Mild right basilar atelectasis/scarring again identified.  No airspace  disease, pleural effusion or pneumothorax.  Surgical sutures overlying the left abdomen noted.  Jejunostomy catheter is again noted.  The bowel gas pattern is unremarkable without evidence of bowel obstruction or pneumoperitoneum.  No suspicious calcifications are identified.  The bony structures are unremarkable.  IMPRESSION: No evidence of acute abnormality.  Normal bowel gas pattern.   Electronically Signed   By: Hassan Rowan M.D.   On: 10/19/2013 19:26     EKG Interpretation   Date/Time:  Monday October 21 2013 11:47:03 EDT Ventricular Rate:  106 PR Interval:  109 QRS Duration: 69 QT Interval:  359 QTC Calculation: 477 R Axis:   61 Text Interpretation:  Sinus tachycardia Consider left ventricular  hypertrophy since last tracing no significant change Confirmed by Corneilus Heggie  MD, Threasa Beards [phone]) on 10/21/2013 4:29:00 PM      MDM   Final diagnoses:  Pain of upper abdomen  Non-intractable vomiting with nausea, vomiting of unspecified type    Patient is given a dose of Dilaudid as well as Zofran. She felt much better after this. She has a benign abdominal exam. She has a complicated history with recent diagnosis of stomach cancer. There is no evidence of obstruction on x-ray. Given her marked improvement of symptoms, I don't feel that we need to repeat a CT scan of her abdomen and pelvis at this point. Her labs are similar to her prior values. She has an appointment to followup with Dr. Inda Merlin with oncology on Friday.  She's currently on a 25 mcg fentanyl patch and oxycodone for breakthrough pain. She's had hard time getting her medications through her G-tube. She's requesting an increase her fentanyl patch. She's also using Phenergan for nausea but has not tried Zofran. I did go ahead and change her fentanyl patch to 50 mcg patch. I advised her to not use Phenergan and fentanyl together until she sees how drowsy the fentanyl patch makes her. I did give her prescription for 8 mg Zofran ODT tablets.  She is currently tolerating by mouth fluids with no vomiting. I recommend a clear liquid diet for the next 24 hours and then try to progress after that. I encouraged her to call Dr. Worthy Flank office if she has any ongoing problems or return here as needed if her symptoms worsen.    Malvin Johns, MD 10/21/13 [place], MD 10/21/13 [phone]

## 2013-10-21 NOTE — ED Notes (Signed)
Pt to radiology.

## 2013-10-21 NOTE — Discharge Instructions (Signed)

## 2013-10-21 NOTE — ED Notes (Signed)
PER EMS - pt from home with c/o abd pain and nausea x4 days, seen at [facility] yesterday for same.  10/10 abd pain.  Pt with scopolamine patch, zofran and phenergan.

## 2013-10-21 NOTE — ED Notes (Signed)
Initial Contact - pt A+Ox4, dry heaving.  Pt reports c/o 10/10 diffuse abd pain, moreso in LLQ "and around to my ribs".  Pt reports pain started "late last night", then reports was in another ER yesterday for same, and has had abd pain "for a while now".  Pt with 38mcg Fentanyl patch to R shoulder, scopolamine patch behind L ear.  Pt also reports taking zofran and phenergan and oxycodone without relief of sympt.  Pt initially denies CP/SOB, then reports "tightness in my chest".  Speaking full/clear sentences, rr even/un-lab.  Skin PWD.  Abd s/nt/nd.  PEG tube noted, pt reports taking medication through PEG tube "but then i throw everything up".  MAEI, self repositioning for comfort.  Placed to cardiac/02 monitor.  NAD.

## 2013-10-22 ENCOUNTER — Encounter (HOSPITAL_COMMUNITY): Payer: Self-pay | Admitting: Emergency Medicine

## 2013-10-22 ENCOUNTER — Inpatient Hospital Stay (HOSPITAL_COMMUNITY)
Admission: EM | Admit: 2013-10-22 | Discharge: 2013-11-05 | DRG: 882 | Disposition: A | Discharge status: Home Health | Payer: No Typology Code available for payment source | Attending: Internal Medicine | Admitting: Internal Medicine

## 2013-10-22 ENCOUNTER — Inpatient Hospital Stay (HOSPITAL_COMMUNITY): Payer: No Typology Code available for payment source

## 2013-10-22 DIAGNOSIS — R1115 Cyclical vomiting syndrome unrelated to migraine: Secondary | ICD-10-CM

## 2013-10-22 DIAGNOSIS — R404 Transient alteration of awareness: Secondary | ICD-10-CM | POA: Diagnosis not present

## 2013-10-22 DIAGNOSIS — E876 Hypokalemia: Secondary | ICD-10-CM | POA: Diagnosis present

## 2013-10-22 DIAGNOSIS — K929 Disease of digestive system, unspecified: Secondary | ICD-10-CM | POA: Diagnosis present

## 2013-10-22 DIAGNOSIS — F411 Generalized anxiety disorder: Secondary | ICD-10-CM | POA: Diagnosis present

## 2013-10-22 DIAGNOSIS — K7689 Other specified diseases of liver: Secondary | ICD-10-CM | POA: Diagnosis present

## 2013-10-22 DIAGNOSIS — K9429 Other complications of gastrostomy: Secondary | ICD-10-CM | POA: Diagnosis present

## 2013-10-22 DIAGNOSIS — D649 Anemia, unspecified: Secondary | ICD-10-CM | POA: Diagnosis present

## 2013-10-22 DIAGNOSIS — F4310 Post-traumatic stress disorder, unspecified: Secondary | ICD-10-CM

## 2013-10-22 DIAGNOSIS — K5900 Constipation, unspecified: Secondary | ICD-10-CM | POA: Diagnosis present

## 2013-10-22 DIAGNOSIS — F3341 Major depressive disorder, recurrent, in partial remission: Secondary | ICD-10-CM

## 2013-10-22 DIAGNOSIS — E860 Dehydration: Secondary | ICD-10-CM | POA: Diagnosis present

## 2013-10-22 DIAGNOSIS — K560 Paralytic ileus: Secondary | ICD-10-CM | POA: Diagnosis present

## 2013-10-22 DIAGNOSIS — F39 Unspecified mood [affective] disorder: Secondary | ICD-10-CM | POA: Diagnosis present

## 2013-10-22 DIAGNOSIS — Z833 Family history of diabetes mellitus: Secondary | ICD-10-CM | POA: Diagnosis not present

## 2013-10-22 DIAGNOSIS — I10 Essential (primary) hypertension: Secondary | ICD-10-CM | POA: Diagnosis present

## 2013-10-22 DIAGNOSIS — C163 Malignant neoplasm of pyloric antrum: Secondary | ICD-10-CM | POA: Diagnosis present

## 2013-10-22 DIAGNOSIS — F450 Somatization disorder: Principal | ICD-10-CM

## 2013-10-22 DIAGNOSIS — M545 Low back pain, unspecified: Secondary | ICD-10-CM | POA: Diagnosis present

## 2013-10-22 DIAGNOSIS — R112 Nausea with vomiting, unspecified: Secondary | ICD-10-CM | POA: Diagnosis present

## 2013-10-22 DIAGNOSIS — F05 Delirium due to known physiological condition: Secondary | ICD-10-CM

## 2013-10-22 DIAGNOSIS — N179 Acute kidney failure, unspecified: Secondary | ICD-10-CM | POA: Diagnosis present

## 2013-10-22 DIAGNOSIS — R32 Unspecified urinary incontinence: Secondary | ICD-10-CM | POA: Diagnosis present

## 2013-10-22 DIAGNOSIS — R1110 Vomiting, unspecified: Secondary | ICD-10-CM | POA: Insufficient documentation

## 2013-10-22 DIAGNOSIS — K219 Gastro-esophageal reflux disease without esophagitis: Secondary | ICD-10-CM | POA: Diagnosis present

## 2013-10-22 DIAGNOSIS — K5909 Other constipation: Secondary | ICD-10-CM | POA: Diagnosis present

## 2013-10-22 DIAGNOSIS — Z903 Acquired absence of stomach [part of]: Secondary | ICD-10-CM | POA: Diagnosis not present

## 2013-10-22 DIAGNOSIS — Z431 Encounter for attention to gastrostomy: Secondary | ICD-10-CM

## 2013-10-22 DIAGNOSIS — Y836 Removal of other organ (partial) (total) as the cause of abnormal reaction of the patient, or of later complication, without mention of misadventure at the time of the procedure: Secondary | ICD-10-CM | POA: Diagnosis present

## 2013-10-22 DIAGNOSIS — Z87891 Personal history of nicotine dependence: Secondary | ICD-10-CM | POA: Diagnosis not present

## 2013-10-22 DIAGNOSIS — Y849 Medical procedure, unspecified as the cause of abnormal reaction of the patient, or of later complication, without mention of misadventure at the time of the procedure: Secondary | ICD-10-CM | POA: Diagnosis present

## 2013-10-22 DIAGNOSIS — Z6826 Body mass index (BMI) 26.0-26.9, adult: Secondary | ICD-10-CM | POA: Diagnosis not present

## 2013-10-22 DIAGNOSIS — I959 Hypotension, unspecified: Secondary | ICD-10-CM | POA: Diagnosis not present

## 2013-10-22 DIAGNOSIS — Z8249 Family history of ischemic heart disease and other diseases of the circulatory system: Secondary | ICD-10-CM | POA: Diagnosis not present

## 2013-10-22 DIAGNOSIS — I498 Other specified cardiac arrhythmias: Secondary | ICD-10-CM | POA: Diagnosis present

## 2013-10-22 DIAGNOSIS — E43 Unspecified severe protein-calorie malnutrition: Secondary | ICD-10-CM

## 2013-10-22 DIAGNOSIS — Z79899 Other long term (current) drug therapy: Secondary | ICD-10-CM

## 2013-10-22 DIAGNOSIS — G8929 Other chronic pain: Secondary | ICD-10-CM | POA: Diagnosis present

## 2013-10-22 DIAGNOSIS — K259 Gastric ulcer, unspecified as acute or chronic, without hemorrhage or perforation: Secondary | ICD-10-CM | POA: Diagnosis present

## 2013-10-22 DIAGNOSIS — K3184 Gastroparesis: Secondary | ICD-10-CM

## 2013-10-22 DIAGNOSIS — C169 Malignant neoplasm of stomach, unspecified: Secondary | ICD-10-CM

## 2013-10-22 DIAGNOSIS — R1032 Left lower quadrant pain: Secondary | ICD-10-CM | POA: Diagnosis present

## 2013-10-22 DIAGNOSIS — M79622 Pain in left upper arm: Secondary | ICD-10-CM | POA: Diagnosis present

## 2013-10-22 DIAGNOSIS — Z515 Encounter for palliative care: Secondary | ICD-10-CM

## 2013-10-22 LAB — COMPREHENSIVE METABOLIC PANEL
ALBUMIN: 4.1 g/dL (ref 3.5–5.2)
ALT: 72 U/L — AB (ref 0–35)
ALT: 67 U/L — AB (ref 0–35)
AST: 32 U/L (ref 0–37)
AST: 26 U/L (ref 0–37)
Albumin: 3.6 g/dL (ref 3.5–5.2)
Alkaline Phosphatase: 139 U/L — ABNORMAL HIGH (ref 39–117)
Alkaline Phosphatase: 126 U/L — ABNORMAL HIGH (ref 39–117)
Anion gap: 17 — ABNORMAL HIGH (ref 5–15)
Anion gap: 15 (ref 5–15)
BUN: 6 mg/dL (ref 6–23)
BUN: 5 mg/dL — ABNORMAL LOW (ref 6–23)
CO2: 24 mEq/L (ref 19–32)
CO2: 23 mEq/L (ref 19–32)
Calcium: 9.6 mg/dL (ref 8.4–10.5)
Calcium: 9.2 mg/dL (ref 8.4–10.5)
Chloride: 95 mEq/L — ABNORMAL LOW (ref 96–112)
Chloride: 96 mEq/L (ref 96–112)
Creatinine, Ser: 0.74 mg/dL (ref 0.50–1.10)
Creatinine, Ser: 0.68 mg/dL (ref 0.50–1.10)
GFR calc Af Amer: >90 mL/min (ref >90)
GFR calc Af Amer: >90 mL/min (ref >90)
GFR calc non Af Amer: >90 mL/min (ref >90)
Glucose, Bld: 106 mg/dL — ABNORMAL HIGH (ref 70–99)
Glucose, Bld: 103 mg/dL — ABNORMAL HIGH (ref 70–99)
Potassium: 4 mEq/L (ref 3.7–5.3)
Potassium: 4.3 mEq/L (ref 3.7–5.3)
SODIUM: 136 meq/L — AB (ref 137–147)
SODIUM: 134 meq/L — AB (ref 137–147)
TOTAL PROTEIN: 8.2 g/dL (ref 6.0–8.3)
Total Bilirubin: 0.7 mg/dL (ref 0.3–1.2)
Total Bilirubin: 0.6 mg/dL (ref 0.3–1.2)
Total Protein: 7.4 g/dL (ref 6.0–8.3)

## 2013-10-22 LAB — CBC
HEMATOCRIT: 31 % — AB (ref 36.0–46.0)
Hemoglobin: 9.5 g/dL — ABNORMAL LOW (ref 12.0–15.0)
MCH: 24.9 pg — AB (ref 26.0–34.0)
MCHC: 30.6 g/dL (ref 30.0–36.0)
MCV: 81.4 fL (ref 78.0–100.0)
PLATELETS: 307 10*3/uL (ref 150–400)
RBC: 3.81 MIL/uL — AB (ref 3.87–5.11)
RDW: 15.1 % (ref 11.5–15.5)
WBC: 5.5 10*3/uL (ref 4.0–10.5)

## 2013-10-22 LAB — PHOSPHORUS: Phosphorus: 3.6 mg/dL (ref 2.3–4.6)

## 2013-10-22 LAB — MAGNESIUM: Magnesium: 1.6 mg/dL (ref 1.5–2.5)

## 2013-10-22 LAB — PREALBUMIN: Prealbumin: 17.2 mg/dL — ABNORMAL LOW (ref 17.0–34.0)

## 2013-10-22 LAB — TSH: TSH: 1.59 u[IU]/mL (ref 0.350–4.500)

## 2013-10-22 MED ORDER — PROMETHAZINE HCL 6.25 MG/5ML PO SYRP
12.5000 mg | ORAL_SOLUTION | Freq: Four times a day (QID) | ORAL | Status: DC | PRN
  Filled 2013-10-22: qty 10

## 2013-10-22 MED ORDER — HYDROMORPHONE HCL PF 1 MG/ML IJ SOLN
1.0000 mg | INTRAMUSCULAR | Status: DC | PRN
  Administered 2013-10-22 – 2013-10-23 (×6): 1 mg via INTRAVENOUS
  Filled 2013-10-22 (×6): qty 1

## 2013-10-22 MED ORDER — ACETAMINOPHEN 325 MG PO TABS: 650.0000 mg | ORAL_TABLET | Freq: Four times a day (QID) | ORAL | Status: DC | PRN

## 2013-10-22 MED ORDER — HYDROCODONE-ACETAMINOPHEN 5-325 MG PO TABS: 1.0000 | ORAL_TABLET | ORAL | Status: DC | PRN

## 2013-10-22 MED ORDER — ACETAMINOPHEN 650 MG RE SUPP: 650.0000 mg | Freq: Four times a day (QID) | RECTAL | Status: DC | PRN

## 2013-10-22 MED ORDER — ONDANSETRON HCL 4 MG/2ML IJ SOLN
4.0000 mg | Freq: Once | INTRAMUSCULAR | Status: AC
  Administered 2013-10-22: 4 mg via INTRAVENOUS
  Filled 2013-10-22: qty 2

## 2013-10-22 MED ORDER — PANTOPRAZOLE SODIUM 40 MG IV SOLR
40.0000 mg | Freq: Every day | INTRAVENOUS | Status: DC
  Administered 2013-10-22 – 2013-10-23 (×2): 40 mg via INTRAVENOUS
  Filled 2013-10-22 (×3): qty 40

## 2013-10-22 MED ORDER — SODIUM CHLORIDE 0.9 % IJ SOLN
3.0000 mL | Freq: Two times a day (BID) | INTRAMUSCULAR | Status: DC
Start: 2013-10-22 — End: 2013-11-05
  Administered 2013-10-29 – 2013-11-02 (×8): 3 mL via INTRAVENOUS

## 2013-10-22 MED ORDER — ENOXAPARIN SODIUM 40 MG/0.4ML ~~LOC~~ SOLN
40.0000 mg | SUBCUTANEOUS | Status: DC
  Administered 2013-10-22 – 2013-11-03 (×12): 40 mg via SUBCUTANEOUS
  Filled 2013-10-22 (×14): qty 0.4

## 2013-10-22 MED ORDER — SODIUM CHLORIDE 0.9 % IV SOLN
INTRAVENOUS | Status: AC
  Administered 2013-10-22 (×2): via INTRAVENOUS

## 2013-10-22 MED ORDER — HYDROMORPHONE HCL PF 1 MG/ML IJ SOLN
1.0000 mg | Freq: Once | INTRAMUSCULAR | Status: AC
  Administered 2013-10-22: 1 mg via INTRAVENOUS
  Filled 2013-10-22: qty 1

## 2013-10-22 MED ORDER — METOCLOPRAMIDE HCL 5 MG/ML IJ SOLN
5.0000 mg | Freq: Four times a day (QID) | INTRAMUSCULAR | Status: DC
  Administered 2013-10-22 – 2013-10-26 (×16): 5 mg via INTRAVENOUS
  Filled 2013-10-22 (×4): qty 1
  Filled 2013-10-22 (×2): qty 2
  Filled 2013-10-22: qty 1
  Filled 2013-10-22: qty 2
  Filled 2013-10-22 (×4): qty 1
  Filled 2013-10-22: qty 2
  Filled 2013-10-22 (×2): qty 1
  Filled 2013-10-22: qty 2
  Filled 2013-10-22: qty 1
  Filled 2013-10-22: qty 2
  Filled 2013-10-22 (×4): qty 1

## 2013-10-22 MED ORDER — SCOPOLAMINE 1 MG/3DAYS TD PT72
1.0000 | MEDICATED_PATCH | TRANSDERMAL | Status: DC
  Administered 2013-10-25 – 2013-11-03 (×4): 1.5 mg via TRANSDERMAL
  Filled 2013-10-22 (×5): qty 1

## 2013-10-22 MED ORDER — CARVEDILOL 6.25 MG PO TABS
6.2500 mg | ORAL_TABLET | Freq: Two times a day (BID) | ORAL | Status: DC
  Administered 2013-10-22 – 2013-11-05 (×24): 6.25 mg via ORAL
  Filled 2013-10-22 (×31): qty 1

## 2013-10-22 MED ORDER — OXYCODONE HCL 5 MG/5ML PO SOLN: 5.0000 mg | ORAL | Status: DC | PRN

## 2013-10-22 MED ORDER — CITALOPRAM HYDROBROMIDE 20 MG PO TABS
20.0000 mg | ORAL_TABLET | Freq: Every day | ORAL | Status: DC
  Administered 2013-10-22 – 2013-10-23 (×2): 20 mg via ORAL
  Filled 2013-10-22 (×3): qty 1

## 2013-10-22 MED ORDER — DOCUSATE SODIUM 100 MG PO CAPS
100.0000 mg | ORAL_CAPSULE | Freq: Two times a day (BID) | ORAL | Status: DC
  Administered 2013-10-22 – 2013-10-25 (×3): 100 mg via ORAL
  Filled 2013-10-22 (×10): qty 1

## 2013-10-22 MED ORDER — IOHEXOL 300 MG/ML  SOLN
50.0000 mL | Freq: Once | INTRAMUSCULAR | Status: AC | PRN
  Administered 2013-10-22: 50 mL via ORAL

## 2013-10-22 MED ORDER — SENNOSIDES 8.8 MG/5ML PO SYRP
5.0000 mL | ORAL_SOLUTION | Freq: Every day | ORAL | Status: DC
  Administered 2013-10-27: 5 mL via ORAL
  Filled 2013-10-22 (×7): qty 5

## 2013-10-22 MED ORDER — SODIUM CHLORIDE 0.9 % IV BOLUS (SEPSIS)
1000.0000 mL | Freq: Once | INTRAVENOUS | Status: AC
  Administered 2013-10-22: 1000 mL via INTRAVENOUS

## 2013-10-22 MED ORDER — ONDANSETRON HCL 4 MG PO TABS: 4.0000 mg | ORAL_TABLET | Freq: Four times a day (QID) | ORAL | Status: DC | PRN

## 2013-10-22 MED ORDER — PANTOPRAZOLE SODIUM 40 MG PO TBEC
40.0000 mg | DELAYED_RELEASE_TABLET | Freq: Every day | ORAL | Status: DC
  Administered 2013-10-22: 40 mg via ORAL
  Filled 2013-10-22 (×2): qty 1

## 2013-10-22 MED ORDER — ONDANSETRON HCL 4 MG/2ML IJ SOLN
4.0000 mg | Freq: Four times a day (QID) | INTRAMUSCULAR | Status: DC | PRN
  Administered 2013-10-22 – 2013-11-01 (×14): 4 mg via INTRAVENOUS
  Filled 2013-10-22 (×14): qty 2

## 2013-10-22 MED ORDER — ZOLPIDEM TARTRATE 10 MG PO TABS: 10.0000 mg | ORAL_TABLET | Freq: Every evening | ORAL | Status: DC | PRN

## 2013-10-22 MED ORDER — FENTANYL 50 MCG/HR TD PT72
50.0000 ug | MEDICATED_PATCH | TRANSDERMAL | Status: DC
  Administered 2013-10-25 – 2013-10-28 (×2): 50 ug via TRANSDERMAL
  Filled 2013-10-22 (×2): qty 1

## 2013-10-22 MED ORDER — ZOLPIDEM TARTRATE 5 MG PO TABS: 5.0000 mg | ORAL_TABLET | Freq: Every evening | ORAL | Status: DC | PRN

## 2013-10-22 NOTE — Progress Notes (Signed)
Patient seen and examined. Admitted after midnight secondary to intractable nausea and vomiting. Please referred to Dr. Rueben Bash [facility] for further info/details on admission. Right now no active vomiting, but still nauseated.  Plan: -patient with underlying depression, which can also be part of chronic N/V. Will start celexa -continue bowel rest -continue PRN antiemetics and schedule reglan -will follow response -spoke with Dr. Gerre Couch (GI); no further recommendations at this point; will follow patient from a distance in case that EGD might be needed down the road. -surgery consulted, for assistance with parenteral nutrition.   Barton Dubois [phone]

## 2013-10-22 NOTE — Consult Note (Signed)
Reason for Consult: Gastric cancer with ongoing intractable nausea and vomiting Referring Physician: Dr. Barton Dubois  Paula [name] is an 48 y.o. female.  HPI: 48 y/o admitted 09/14/13 with 1 year of nausea, vomiting and epigastric pain.  EGD by Dr. Penelope Coop showed 3 ulcers in the antrum of the stomach ranging from 5-7 mm with no bleeding.  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  She was taken to the OR on 09/20/13 and underwent Diagnostic laparoscopy, distal gastrectomy with billroth II anastamosis and feeding gastrojejunostomy tube, by Dr. Barry Dienes.  UGI on 10/02/13 shoed normal post gastrojejunostomy without perforation.  He diet was slowly advanced but she has ongoing issues with pain and nausea. We worked to mobilize her, and limit her narcotics. She required ongoing high levels of narcotics for pain relief.  In addition to her own health issues she lost 2 brothers to MVA'S in a very short time period, including her post surgery period.  Pathology shows a stage III gastric cancer.  Dr. [name] saw her and is going to recommend chemo and radiation therapy after her recovery.  She was placed on TNA.  We tried tube feeding, 10/04/13, she did not tolerate these much better than she did the oral liquids.  She would have some emesis, but never very much.  We went thru a series of  Clear liquids to full liquids, TNA and attempts to tube feed and drain her via her gastrojejunostomy tube. She also refused PO pain meds frequently and would only take IV pain medication.  She was ultimately sent home on TNA, morphine concentrate, reglan, prilosec, Prozac, fentanyl, for her pain and nausea 10/13/13. She was having nausea when she went home refusing Zofran and taking phenergan at home.  She was seen later that evening in the ED with nausea, dry heaves, abdominal pain and vomiting.  She was seen by Palliative during her second admittsion. She also ask that we  remove the gastrostomy tube. Palliative note stresses, she is seeking full curative treatment at this time.  She was discharged home again on 10/17/13, on phenergan, coreg, fentanyl, Prozac lido derm, Reglan, oxycodone solution,  Prilosec,sennakot and Ambien.  She was back to the ED on 8/29 with pain nausea, she was discharged home at that time.   She requested dilaudid in place of patch during that visit.   She is readmitted again on 10/22/13, with ongoing nausea, vomiting and pain, unrelieved since her surgery. Work up in the ED shows no fever, some BP elevation, labs are essentially normal.  3 way films are normal .  Dr. Dyann Kief as [place] to see.  Dr. Gerre Couch from GI says he has nothing to add.   Past Medical History  Diagnosis Date  . Headache(784.0) 2011    Migraines  . Esophageal reflux     On omeprazole  . Costochondritis   . Hypertension     started around 2011  . Ovarian cyst   . Stomach cancer     Past Surgical History  Procedure Laterality Date  . Oophorectomy      Around 1985 left ovary removal  . Tonsillectomy      Around 1994  . Tubal ligation    . Esophagogastroduodenoscopy N/A 09/16/2013    Procedure: ESOPHAGOGASTRODUODENOSCOPY (EGD);  Surgeon: Wonda Horner, MD;  Location: [facility];  Service: Endoscopy;  Laterality: N/A;  . Laparoscopy N/A 09/20/2013    Procedure: DIAGNOSTIC LAPAROSCOPY,DISTAL GASTRECTOMY AND FEEDING GASTROJEJUNOSTOMY;  Surgeon: [name],  MD;  Location: [facility];  Service: General;  Laterality: N/A;    Family History  Problem Relation Age of Onset  . Hypertension Mother   . Diabetes Mellitus II Mother   . Hypertension Sister   . Hypertension Brother   . Heart failure Maternal Grandmother   . Hypertension Maternal Grandfather     Social History:  reports that she has quit smoking. She has never used smokeless tobacco. She reports that she drinks alcohol. She reports that she does not use illicit drugs.  Allergies: No Known Allergies  Medications:   Prior to Admission:  Prescriptions prior to admission  Medication Sig Dispense Refill  . carvedilol (COREG) 6.25 MG tablet Take 6.25 mg by mouth 2 (two) times daily with a meal.      . fentaNYL (DURAGESIC - DOSED MCG/HR) 25 MCG/HR patch Place 25 mcg onto the skin every 3 (three) days.      . fentaNYL (DURAGESIC) 50 MCG/HR Place 1 patch (50 mcg total) onto the skin every 3 (three) days.  5 patch  0  . lidocaine (LIDODERM) 5 % Place 1 patch onto the skin daily. Remove & Discard patch within 12 hours or as directed by MD      . metoCLOPramide (REGLAN) 5 MG tablet Take 5 mg by mouth 4 (four) times daily.      . Multiple Vitamins-Minerals (MULTIVITAMIN & MINERAL PO) Take 1 tablet by mouth daily.      Marland Kitchen omeprazole (PRILOSEC) 20 MG capsule Take 20 mg by mouth 2 (two) times daily before a meal.      . ondansetron (ZOFRAN-ODT) 8 MG disintegrating tablet Take 8 mg by mouth every 4 (four) hours as needed for nausea or vomiting.      Marland Kitchen oxyCODONE (ROXICODONE) 5 MG/5ML solution Take 5 mg by mouth every 3 (three) hours as needed for severe pain or breakthrough pain.      . promethazine (PHENERGAN) 6.25 MG/5ML syrup Take 12.5 mg by mouth every 6 (six) hours as needed for nausea or vomiting.      Marland Kitchen scopolamine (TRANSDERM-SCOP) 1 MG/3DAYS Place 1 patch onto the skin every 3 (three) days.      . sennosides (SENOKOT) 8.8 MG/5ML syrup Take 5 mLs by mouth at bedtime.      Marland Kitchen zolpidem (AMBIEN) 10 MG tablet Take 10 mg by mouth at bedtime as needed for sleep.       Scheduled: . carvedilol  6.25 mg Oral BID WC  . citalopram  20 mg Oral Daily  . docusate sodium  100 mg Oral BID  . enoxaparin (LOVENOX) injection  40 mg Subcutaneous Q24H  . fentaNYL  50 mcg Transdermal Q72H  . metoCLOPramide (REGLAN) injection  5 mg Intravenous 4 times per day  . pantoprazole  40 mg Oral Daily  . pantoprazole (PROTONIX) IV  40 mg Intravenous QHS  . scopolamine  1 patch Transdermal Q72H  . sennosides  5 mL Oral QHS  . sodium chloride   3 mL Intravenous Q12H   Continuous: . sodium chloride 100 mL/hr at 10/22/13 1594   VOP:[id number], acetaminophen, HYDROcodone-acetaminophen, HYDROmorphone (DILAUDID) injection, ondansetron (ZOFRAN) IV, ondansetron, oxyCODONE, promethazine, zolpidem Anti-infectives   None      Results for orders placed during the hospital encounter of 10/22/13 (from the past 48 hour(s))  COMPREHENSIVE METABOLIC PANEL     Status: Abnormal   Collection Time    10/22/13  1:53 AM      Result Value Ref Range   Sodium  136 (*) 137 - 147 mEq/L   Potassium 4.0  3.7 - 5.3 mEq/L   Chloride 95 (*) 96 - 112 mEq/L   CO2 24  19 - 32 mEq/L   Glucose, Bld 106 (*) 70 - 99 mg/dL   BUN 6  6 - 23 mg/dL   Creatinine, Ser 0.74  0.50 - 1.10 mg/dL   Calcium 9.6  8.4 - 10.5 mg/dL   Total Protein 8.2  6.0 - 8.3 g/dL   Albumin 4.1  3.5 - 5.2 g/dL   AST 32  0 - 37 U/L   ALT 72 (*) 0 - 35 U/L   Alkaline Phosphatase 139 (*) 39 - 117 U/L   Total Bilirubin 0.7  0.3 - 1.2 mg/dL   GFR calc non Af Amer >90  >90 mL/min   GFR calc Af Amer >90  >90 mL/min   Comment: (NOTE)     The eGFR has been calculated using the CKD EPI equation.     This calculation has not been validated in all clinical situations.     eGFR's persistently <90 mL/min signify possible Chronic Kidney     Disease.   Anion gap 17 (*) 5 - 15  MAGNESIUM     Status: None   Collection Time    10/22/13  6:50 AM      Result Value Ref Range   Magnesium 1.6  1.5 - 2.5 mg/dL  PHOSPHORUS     Status: None   Collection Time    10/22/13  6:50 AM      Result Value Ref Range   Phosphorus 3.6  2.3 - 4.6 mg/dL  TSH     Status: None   Collection Time    10/22/13  6:50 AM      Result Value Ref Range   TSH 1.590  0.350 - 4.500 uIU/mL   Comment: Performed at [place] PANEL     Status: Abnormal   Collection Time    10/22/13  6:50 AM      Result Value Ref Range   Sodium 134 (*) 137 - 147 mEq/L   Potassium 4.3  3.7 - 5.3 mEq/L    Chloride 96  96 - 112 mEq/L   CO2 23  19 - 32 mEq/L   Glucose, Bld 103 (*) 70 - 99 mg/dL   BUN 5 (*) 6 - 23 mg/dL   Creatinine, Ser 0.68  0.50 - 1.10 mg/dL   Calcium 9.2  8.4 - 10.5 mg/dL   Total Protein 7.4  6.0 - 8.3 g/dL   Albumin 3.6  3.5 - 5.2 g/dL   AST 26  0 - 37 U/L   ALT 67 (*) 0 - 35 U/L   Alkaline Phosphatase 126 (*) 39 - 117 U/L   Total Bilirubin 0.6  0.3 - 1.2 mg/dL   GFR calc non Af Amer >90  >90 mL/min   GFR calc Af Amer >90  >90 mL/min   Comment: (NOTE)     The eGFR has been calculated using the CKD EPI equation.     This calculation has not been validated in all clinical situations.     eGFR's persistently <90 mL/min signify possible Chronic Kidney     Disease.   Anion gap 15  5 - 15  CBC     Status: Abnormal   Collection Time    10/22/13  6:50 AM      Result Value Ref Range   WBC 5.5  4.0 - 10.5 K/uL   RBC 3.81 (*) 3.87 - 5.11 MIL/uL   Hemoglobin 9.5 (*) 12.0 - 15.0 g/dL   HCT 31.0 (*) 36.0 - 46.0 %   MCV 81.4  78.0 - 100.0 fL   MCH 24.9 (*) 26.0 - 34.0 pg   MCHC 30.6  30.0 - 36.0 g/dL   RDW 15.1  11.5 - 15.5 %   Platelets 307  150 - 400 K/uL    Dg Abd Acute W/chest  10/21/2013   CLINICAL DATA:  Gastric carcinoma. Abdominal pain. Vomiting. Cough.  EXAM: ACUTE ABDOMEN SERIES (ABDOMEN 2 VIEW & CHEST 1 VIEW)  COMPARISON:  None.  FINDINGS: Percutaneous gastrostomy tube again seen in left upper quadrant. Surgical clips and staples noted. No evidence of dilated bowel loops or free air.  Mild elevation of right hemidiaphragm and right basilar pleural- parenchymal scarring appears stable. No evidence of acute infiltrate or edema. No evidence of pleural effusion. Heart size is within normal limits. No mass or lymphadenopathy identified.  IMPRESSION: Unremarkable bowel gas pattern.  No acute findings.  Stable right basilar pleural- parenchymal scarring. No active lung disease.   Electronically Signed   By: Earle Gell M.D.   On: 10/21/2013 14:22   Dg Abd Acute  W/chest  10/20/2013   CLINICAL DATA:  Worsening abdominal pain after recent discharge. Recent diagnosis of gastric cancer.  EXAM: ACUTE ABDOMEN SERIES (ABDOMEN 2 VIEW & CHEST 1 VIEW)  COMPARISON:  Acute abdominal series 10/19/2013  FINDINGS: Similar appearance of right basilar scarring. Otherwise, the lungs are clear. Stable cardiac and mediastinal contours. No free air. Percutaneous gastrostomy tube appears in similar position and projects over the stomach. Staple lines are noted in the left upper quadrant suggesting partial gastric resection. No evidence of ascites or bowel obstruction. No acute osseous abnormality.  IMPRESSION: 1. No acute cardiopulmonary process. 2. Stable right basilar atelectasis versus scarring. 3. Stable position of gastrostomy tube. 4. Nonobstructed bowel gas pattern. 5. No evidence of free air or ascites.   Electronically Signed   By: Jacqulynn Cadet M.D.   On: 10/20/2013 14:54    Review of Systems  Constitutional: Negative.   Eyes: Negative.   Respiratory: Negative.   Cardiovascular: Negative.   Gastrointestinal: Negative.   Genitourinary: Negative.   Skin: Negative.   Neurological: Negative.   Psychiatric/Behavioral: Positive for depression.   Blood pressure 155/86, pulse 102, temperature 98.9 F (37.2 C), temperature source Oral, resp. rate 15, height 5' (1.524 m), weight 67.722 kg (149 lb 4.8 oz), last menstrual period 09/12/2013, SpO2 99.00%. Physical Exam  Constitutional: She is oriented to person, place, and time. She appears well-developed. No distress.  Thin female, no distress  HENT:  Head: Normocephalic and atraumatic.  Nose: Nose normal.  Eyes: Conjunctivae and EOM are normal. Pupils are equal, round, and reactive to light. Right eye exhibits no discharge. Left eye exhibits no discharge. No scleral icterus.  Neck: Normal range of motion. Neck supple. No JVD present. No tracheal deviation present. No thyromegaly present.  Cardiovascular: Normal rate,  regular rhythm, normal heart sounds and intact distal pulses.  Exam reveals no gallop.   No murmur heard. Respiratory: Breath sounds normal. No respiratory distress. She has no wheezes. She has no rales. She exhibits no tenderness.  GI: Soft. Bowel sounds are normal. She exhibits no distension and no mass. There is no tenderness. There is no rebound and no guarding.  Abdominal incision and gastrojejunostomy tube look fine.  Musculoskeletal: She exhibits no edema.  Lymphadenopathy:    She has no cervical adenopathy.  Neurological: She is alert and oriented to person, place, and time. No cranial nerve deficit.  Skin: Skin is warm and dry. No rash noted. She is not diaphoretic. No erythema. No pallor.  Psychiatric: She has a normal mood and affect. Her behavior is normal. Judgment and thought content normal.    Assessment/Plan: 1.  Recurrent nausea/with 1 year history of nausea. 2.   Gastric Carcinoma; S/p Diagnostic laparoscopy, distal gastrectomy with billroth II anastamosis and feeding gastrojejunostomy tube, [name], MD, 09/20/13.  2. Hypertension/tachycardia  3. Hx of migraine/resolving subdural hematoma  4. Back pain  5. Incontinence  6. PCM  7. Anemia  8. Post op ileus  Plan:  I will repeat UGI last done on 8/12.  I will also ask them to inject each port of the gastrojejunostomy tube to be sure they are still in good position.  From a mechanical standpoint, draining the stomach and jejunostomy feeds are an option, but she didn't find relief when we did this last time.  If that does not work, TNA , motility agents are possible treatments. Complete discontinuation of her narcotics is also an option.   I will discuss with Dr. Harlow Asa and we will follow.  She has an appointment with Dr. [name] tomorrow. Anju Sereno 10/22/2013, 12:19 PM

## 2013-10-22 NOTE — H&P (Signed)
PCP:  Arnoldo Morale, MD    Chief Complaint:  Nausea and vomiting  HPI: Paula [name] is a 48 y.o. female   has a past medical history of Headache(784.0) (2011); Esophageal reflux; Costochondritis; Hypertension; Ovarian cyst; and Stomach cancer.   Patient has hx of Intractable nausea and vomiting for the past 1 year. She went to Endoscopy in July and was found to have 3 gastric ulcers with biopsy positive for adenocarcinoma she had diagnostic laparoscopy with distal gastrectomy/Billroth II anastomosis and feeding G-tube on 09/20/13. She suffered with  postoperative nausea/vomiting, and was discharged home 10/13/13 on bowel rest, TNA. After 4 hours at home she returned due to ongoing vomiting and abdominal pain. TNA was discontinued.  She continued to have nausea and vomiting and was given prescription for Zofran, phenergan reglan and scopolamine patch with no improvement. Patient was discharged on 8/27. Since then she have been going to ER once a day.  Patient states she cannot tolerate any PO intake anything she puts through the [place] she still vomits. She have had abdominal discomfort and back pain. Requests dilaudid. Her fentanyl patch have bee increased to 50 mcg but she states she could not get this prescriptions. Repeated KUB showig no obstruction. Electrolytes wnl.   Hospitalist was called for admission for intractable vomiting  Review of Systems:    Pertinent positives include: nausea, vomiting,  abdominal pain,   Constitutional:  No weight loss, night sweats, Fevers, chills, fatigue, weight loss  HEENT:  No headaches, Difficulty swallowing,Tooth/dental problems,Sore throat,  No sneezing, itching, ear ache, nasal congestion, post nasal drip,  Cardio-vascular:  No chest pain, Orthopnea, PND, anasarca, dizziness, palpitations.no Bilateral lower extremity swelling  GI:  No heartburn, indigestion,diarrhea, change in bowel habits, loss of appetite, melena, blood in stool,  hematemesis Resp:  no shortness of breath at rest. No dyspnea on exertion, No excess mucus, no productive cough, No non-productive cough, No coughing up of blood.No change in color of mucus.No wheezing. Skin:  no rash or lesions. No jaundice GU:  no dysuria, change in color of urine, no urgency or frequency. No straining to urinate.  No flank pain.  Musculoskeletal:  No joint pain or no joint swelling. No decreased range of motion. No back pain.  Psych:  No change in mood or affect. No depression or anxiety. No memory loss.  Neuro: no localizing neurological complaints, no tingling, no weakness, no double vision, no gait abnormality, no slurred speech, no confusion  Otherwise ROS are negative except for above, 10 systems were reviewed  Past Medical History: Past Medical History  Diagnosis Date  . Headache(784.0) 2011    Migraines  . Esophageal reflux     On omeprazole  . Costochondritis   . Hypertension     started around 2011  . Ovarian cyst   . Stomach cancer    Past Surgical History  Procedure Laterality Date  . Oophorectomy      Around 1985 left ovary removal  . Tonsillectomy      Around 1994  . Tubal ligation    . Esophagogastroduodenoscopy N/A 09/16/2013    Procedure: ESOPHAGOGASTRODUODENOSCOPY (EGD);  Surgeon: Wonda Horner, MD;  Location: [facility];  Service: Endoscopy;  Laterality: N/A;  . Laparoscopy N/A 09/20/2013    Procedure: DIAGNOSTIC LAPAROSCOPY,DISTAL GASTRECTOMY AND FEEDING GASTROJEJUNOSTOMY;  Surgeon: [name], MD;  Location: [facility];  Service: General;  Laterality: N/A;     Medications: Prior to Admission medications   Medication Sig Start Date End Date  Taking? Authorizing Provider  carvedilol (COREG) 6.25 MG tablet Take 6.25 mg by mouth 2 (two) times daily with a meal.   Yes Historical Provider, MD  fentaNYL (DURAGESIC - DOSED MCG/HR) 25 MCG/HR patch Place 25 mcg onto the skin every 3 (three) days.   Yes Historical Provider, MD  fentaNYL  (DURAGESIC) 50 MCG/HR Place 1 patch (50 mcg total) onto the skin every 3 (three) days. 10/21/13  Yes Malvin Johns, MD  lidocaine (LIDODERM) 5 % Place 1 patch onto the skin daily. Remove & Discard patch within 12 hours or as directed by MD   Yes Historical Provider, MD  metoCLOPramide (REGLAN) 5 MG tablet Take 5 mg by mouth 4 (four) times daily.   Yes Historical Provider, MD  Multiple Vitamins-Minerals (MULTIVITAMIN & MINERAL PO) Take 1 tablet by mouth daily.   Yes Historical Provider, MD  omeprazole (PRILOSEC) 20 MG capsule Take 20 mg by mouth 2 (two) times daily before a meal.   Yes Historical Provider, MD  ondansetron (ZOFRAN-ODT) 8 MG disintegrating tablet Take 8 mg by mouth every 4 (four) hours as needed for nausea or vomiting.   Yes Historical Provider, MD  oxyCODONE (ROXICODONE) 5 MG/5ML solution Take 5 mg by mouth every 3 (three) hours as needed for severe pain or breakthrough pain.   Yes Historical Provider, MD  promethazine (PHENERGAN) 6.25 MG/5ML syrup Take 12.5 mg by mouth every 6 (six) hours as needed for nausea or vomiting.   Yes Historical Provider, MD  scopolamine (TRANSDERM-SCOP) 1 MG/3DAYS Place 1 patch onto the skin every 3 (three) days.   Yes Historical Provider, MD  sennosides (SENOKOT) 8.8 MG/5ML syrup Take 5 mLs by mouth at bedtime.   Yes Historical Provider, MD  zolpidem (AMBIEN) 10 MG tablet Take 10 mg by mouth at bedtime as needed for sleep.   Yes Historical Provider, MD    Allergies:  No Known Allergies  Social History:  Ambulatory  independently   Lives at home alone      reports that she has quit smoking. She has never used smokeless tobacco. She reports that she drinks alcohol. She reports that she does not use illicit drugs.    Family History: family history includes Diabetes Mellitus II in her mother; Heart failure in her maternal grandmother; Hypertension in her brother, maternal grandfather, mother, and sister.    Physical Exam: Patient Vitals for the  past 24 hrs:  BP Pulse Resp SpO2  10/22/13 0027 144/103 mmHg 106 16 100 %    1. General:  in No Acute distress 2. Psychological: Alert and   Oriented 3. Head/ENT:     Dry Mucous Membranes                          Head Non traumatic, neck supple                          Normal  Dentition 4. SKIN: decreased Skin turgor,  Skin clean Dry and intact no rash 5. Heart: Regular rate and rhythm no Murmur, Rub or gallop 6. Lungs: Clear to auscultation bilaterally, no wheezes or crackles   7. Abdomen: Soft, non-tender, Non distended, GJ tube in place with green fluid 8. Lower extremities: no clubbing, cyanosis, or edema 9. Neurologically Grossly intact, moving all 4 extremities equally 10. MSK: Normal range of motion  body mass index is unknown because there is no weight on file.   Labs on  Admission:   Recent Labs  10/21/13 1200 10/22/13 0153  NA 136* 136*  K 4.3 4.0  CL 96 95*  CO2 24 24  GLUCOSE 128* 106*  BUN 6 6  CREATININE 0.82 0.74  CALCIUM 9.5 9.6    Recent Labs  10/21/13 1200 10/22/13 0153  AST 43* 32  ALT 82* 72*  ALKPHOS 140* 139*  BILITOT 0.6 0.7  PROT 8.0 8.2  ALBUMIN 3.8 4.1    Recent Labs  10/19/13 1820 10/21/13 1200  LIPASE 50 35    Recent Labs  10/20/13 1410 10/21/13 1200  WBC 5.4 4.4  NEUTROABS 3.4 3.4  HGB 10.7* 10.3*  HCT 33.5* 32.6*  MCV 80.5 79.5  PLT 353 382    Recent Labs  10/21/13 1200  TROPONINI <0.30   No results found for this basename: TSH, T4TOTAL, FREET3, T3FREE, THYROIDAB,  in the last 72 hours No results found for this basename: VITAMINB12, FOLATE, FERRITIN, TIBC, IRON, RETICCTPCT,  in the last 72 hours No results found for this basename: HGBA1C    The CrCl is unknown because both a height and weight (above a minimum accepted value) are required for this calculation. ABG No results found for this basename: phart, pco2, po2, hco3, tco2, acidbasedef, o2sat     Lab Results  Component Value Date   DDIMER 0.97*  09/13/2013     Other results:  I have pearsonaly reviewed this: ECG REPORT  Rate: 106  Rhythm: sinus Tachycardia ST&T Change: no ischemia   BNP (last 3 results) No results found for this basename: PROBNP,  in the last 8760 hours  There were no vitals filed for this visit.   Cultures:    Component Value Date/Time   SDES URINE, CLEAN CATCH 09/12/2013 1645   SPECREQUEST NONE 09/12/2013 1645   CULT  Value: NO GROWTH Performed at [facility] 09/12/2013 1645   REPTSTATUS 09/13/2013 FINAL 09/12/2013 1645      Radiological Exams on Admission: Dg Abd Acute W/chest  10/21/2013   CLINICAL DATA:  Gastric carcinoma. Abdominal pain. Vomiting. Cough.  EXAM: ACUTE ABDOMEN SERIES (ABDOMEN 2 VIEW & CHEST 1 VIEW)  COMPARISON:  None.  FINDINGS: Percutaneous gastrostomy tube again seen in left upper quadrant. Surgical clips and staples noted. No evidence of dilated bowel loops or free air.  Mild elevation of right hemidiaphragm and right basilar pleural- parenchymal scarring appears stable. No evidence of acute infiltrate or edema. No evidence of pleural effusion. Heart size is within normal limits. No mass or lymphadenopathy identified.  IMPRESSION: Unremarkable bowel gas pattern.  No acute findings.  Stable right basilar pleural- parenchymal scarring. No active lung disease.   Electronically Signed   By: Earle Gell M.D.   On: 10/21/2013 14:22   Dg Abd Acute W/chest  10/20/2013   CLINICAL DATA:  Worsening abdominal pain after recent discharge. Recent diagnosis of gastric cancer.  EXAM: ACUTE ABDOMEN SERIES (ABDOMEN 2 VIEW & CHEST 1 VIEW)  COMPARISON:  Acute abdominal series 10/19/2013  FINDINGS: Similar appearance of right basilar scarring. Otherwise, the lungs are clear. Stable cardiac and mediastinal contours. No free air. Percutaneous gastrostomy tube appears in similar position and projects over the stomach. Staple lines are noted in the left upper quadrant suggesting partial gastric resection.  No evidence of ascites or bowel obstruction. No acute osseous abnormality.  IMPRESSION: 1. No acute cardiopulmonary process. 2. Stable right basilar atelectasis versus scarring. 3. Stable position of gastrostomy tube. 4. Nonobstructed bowel gas pattern. 5. No evidence of  free air or ascites.   Electronically Signed   By: Jacqulynn Cadet M.D.   On: 10/20/2013 14:54    Chart has been reviewed  Assessment/Plan  48 yo Fwith xh of intractable Nausea and vomiting with recent diagnosis of gastric cancer.  Present on Admission:  . Intractable nausea and vomiting - continue zofran, can schedule  Reglan, GI consult in AM . Gastric carcinoma s/p Distal gastrectomy/GJ (Billroth II) 09/20/2013 - will need to discuss goals of care with oncology . Essential hypertension - continue coreg    Prophylaxis:  Lovenox, Protonix  CODE STATUS:  FULL CODE   Other plan as per orders.  I have spent a total of 55 min on this admission  Isla Sabree 10/22/2013, 3:31 AM  [facility]  Pager [phone]   If 7AM-7PM, please contact the day team taking care of the patient  [email or web address]  Password [email or web address]

## 2013-10-22 NOTE — Consult Note (Signed)
General Surgery [facility]  Patient seen and examined.  Discussed with Modena Jansky and with Dr. Dyann Kief.  Midline abd incision well healed.  G-tube in LUQ with clear site.  Will review UGI study.  Medical oncology to evaluate tomorrow.  Earnstine Regal, MD, [facility] Office: [phone]

## 2013-10-22 NOTE — ED Notes (Signed)
Pt states she had stomach cancer and they removed part of her stomach and she was in the hospital for 31 days  Pt states she was discharged last Thursday  Pt states she was seen here earlier for nausea and vomiting  Pt states she was discharged but not any better so she came back

## 2013-10-22 NOTE — Progress Notes (Signed)
INITIAL NUTRITION ASSESSMENT  DOCUMENTATION CODES Per approved criteria  -Severe malnutrition in the context of chronic illness  Pt meets criteria for severe MALNUTRITION in the context of chronic illness as evidenced by 11% body weight loss in < 3 months, PO intake < 75% for > one month, moderate muscle wasting and subcutaneous fat loss.   INTERVENTION: -Diet advancement per MD -Consider nutrition support as pt unlikey able to sustain nutrition via PO intake -Recommend Unjury Chocolate protein supplement TID as diet advancement tolerated -Will continue to monitor  NUTRITION DIAGNOSIS: Inadequate oral intake related to inability to eat as evidenced by NPO status.   Goal: Pt to meet >/= 90% of their estimated nutrition needs    Monitor:  Diet order, nutrition support needs/orders, total protein/energy intake, labs, weights, GI profile  Reason for Assessment: MST/Consult to assess  48 y.o. female  Admitting Dx: <principal problem not specified>  ASSESSMENT: Paula [name] is a 48 y.o. female has a past medical history of Headache(784.0) (2011); Esophageal reflux; Costochondritis; Hypertension; Ovarian cyst; and Stomach cancer. Pt was discharged on 8/27. Since then she have been going to ER once a day. Patient states she cannot tolerate any PO intake anything she puts through the [facility] [place] she still vomits. She have had abdominal discomfort and back pain. Requests dilaudid. Her fentanyl patch have bee increased to 50 mcg but she states she could not get this prescriptions  -Diet recall indicated pt consuming snack-like meals- several bites of cereal w/milk, juices, yogurt, softened proteins (chicken), and ice cream. Was not consuming Unjury supplement as pt did not know where to procure them. Reinforced [facility] outpatient to purchase them upon d/c as warranted -Educated pt on minimizing intake of high fat foods during episodes of nausea. Would also benefit from limiting high sugar foods  d/t recent gastrectomy surgery and increased risk of dumping syndrome -Endorsed ongoing weight loss of 20-30 lbs since 08/2013 d/t frequent hospitalizations and sub-optimal nutrition -Pt currently NPO and bowel rest d/t ongoing nausea/vomiting. Consider nutrition support as pt unlikley able to sustain nutritional status via PO intake. Has GJ tube, pt has trialed tube feeding during previous admits; however pt was unable to tolerate and experienced nausea and vomiting even at low rate levels -Pt with constipation; has not had BM since 8/29 Nutrition Focused Physical Exam:  Subcutaneous Fat:  Orbital Region: moderate wasting Upper Arm Region: moderate wasting Thoracic and Lumbar Region: WDL  Muscle:  Temple Region: moderate wasting Clavicle Bone Region: moderate wasting Clavicle and Acromion Bone Region: moderate wasting Scapular Bone Region: moderate wasting Dorsal Hand: WDL Patellar Region: WDL Anterior Thigh Region: edema Posterior Calf Region: edema  Edema: non pitting RLE and LLE edema     Height: Ht Readings from Last 1 Encounters:  10/22/13 5' (1.524 m)    Weight: Wt Readings from Last 1 Encounters:  10/22/13 149 lb 4.8 oz (67.722 kg)    Ideal Body Weight: 100 lbs  % Ideal Body Weight: 149%  Wt Readings from Last 10 Encounters:  10/22/13 149 lb 4.8 oz (67.722 kg)  10/20/13 138 lb (62.596 kg)  10/19/13 125 lb (56.7 kg)  10/14/13 146 lb 2.6 oz (66.3 kg)  10/02/13 155 lb 6.8 oz (70.5 kg)  10/02/13 155 lb 6.8 oz (70.5 kg)  10/02/13 155 lb 6.8 oz (70.5 kg)  09/13/13 150 lb (68.04 kg)  09/11/13 150 lb (68.04 kg)  08/15/13 157 lb 12.8 oz (71.578 kg)    Usual Body Weight: 165-170 lbs  % Usual  Body Weight: 90%  BMI:  Body mass index is 29.16 kg/(m^2).  Estimated Nutritional Needs: Kcal: 1800-2000  Protein: 85-95 gram Fluid: >/=1800 ml/daily  Skin: non pitting RLE and LLE edema  Diet Order: NPO  EDUCATION NEEDS: -Education needs  addressed   Intake/Output Summary (Last 24 hours) at 10/22/13 1100 Last data filed at 10/22/13 0700  Gross per 24 hour  Intake 103.33 ml  Output    100 ml  Net   3.33 ml    Last BM: 8/29   Labs:   Recent Labs Lab 10/21/13 1200 10/22/13 0153 10/22/13 0650  NA 136* 136* 134*  K 4.3 4.0 4.3  CL 96 95* 96  CO2 24 24 23   BUN 6 6 5*  CREATININE 0.82 0.74 0.68  CALCIUM 9.5 9.6 9.2  MG  --   --  1.6  PHOS  --   --  3.6  GLUCOSE 128* 106* 103*    CBG (last 3)  No results found for this basename: GLUCAP,  in the last 72 hours  Scheduled Meds: . carvedilol  6.25 mg Oral BID WC  . docusate sodium  100 mg Oral BID  . enoxaparin (LOVENOX) injection  40 mg Subcutaneous Q24H  . fentaNYL  50 mcg Transdermal Q72H  . metoCLOPramide (REGLAN) injection  5 mg Intravenous 4 times per day  . pantoprazole  40 mg Oral Daily  . pantoprazole (PROTONIX) IV  40 mg Intravenous QHS  . scopolamine  1 patch Transdermal Q72H  . sennosides  5 mL Oral QHS  . sodium chloride  3 mL Intravenous Q12H    Continuous Infusions: . sodium chloride 100 mL/hr at 10/22/13 2694    Past Medical History  Diagnosis Date  . Headache(784.0) 2011    Migraines  . Esophageal reflux     On omeprazole  . Costochondritis   . Hypertension     started around 2011  . Ovarian cyst   . Stomach cancer     Past Surgical History  Procedure Laterality Date  . Oophorectomy      Around 1985 left ovary removal  . Tonsillectomy      Around 1994  . Tubal ligation    . Esophagogastroduodenoscopy N/A 09/16/2013    Procedure: ESOPHAGOGASTRODUODENOSCOPY (EGD);  Surgeon: Wonda Horner, MD;  Location: [facility];  Service: Endoscopy;  Laterality: N/A;  . Laparoscopy N/A 09/20/2013    Procedure: DIAGNOSTIC LAPAROSCOPY,DISTAL GASTRECTOMY AND FEEDING GASTROJEJUNOSTOMY;  Surgeon: [name], MD;  Location: [facility];  Service: General;  Laterality: N/A;    Atlee Abide MS RD LDN Clinical Dietitian [phone]

## 2013-10-22 NOTE — ED Provider Notes (Signed)
CSN: [id number]     Arrival date & time 10/22/13  0020 History   First MD Initiated Contact with Patient 10/22/13 0040     Chief Complaint  Patient presents with  . Emesis     (Consider location/radiation/quality/duration/timing/severity/associated sxs/prior Treatment) HPI 48 year old female presents with continued nausea and vomiting since she left the ED several hours ago. She states that she had surgery at the end of last month for partial removal of her stomach a GJ tube. She was given Zofran ODT and has a scopolamine patch but is continuing to have vomiting. She states she's been taking the Zofran under tongue but is not helping. She states she is able to drink ginger ale while in the ED but when she got home at this vomited back up. She's having continued abdominal pain is not different since her surgery.  Past Medical History  Diagnosis Date  . Headache(784.0) 2011    Migraines  . Esophageal reflux     On omeprazole  . Costochondritis   . Hypertension     started around 2011  . Ovarian cyst   . Stomach cancer    Past Surgical History  Procedure Laterality Date  . Oophorectomy      Around 1985 left ovary removal  . Tonsillectomy      Around 1994  . Tubal ligation    . Esophagogastroduodenoscopy N/A 09/16/2013    Procedure: ESOPHAGOGASTRODUODENOSCOPY (EGD);  Surgeon: Wonda Horner, MD;  Location: [facility];  Service: Endoscopy;  Laterality: N/A;  . Laparoscopy N/A 09/20/2013    Procedure: DIAGNOSTIC LAPAROSCOPY,DISTAL GASTRECTOMY AND FEEDING GASTROJEJUNOSTOMY;  Surgeon: [name], MD;  Location: [facility];  Service: General;  Laterality: N/A;   Family History  Problem Relation Age of Onset  . Hypertension Mother   . Diabetes Mellitus II Mother   . Hypertension Sister   . Hypertension Brother   . Heart failure Maternal Grandmother   . Hypertension Maternal Grandfather    History  Substance Use Topics  . Smoking status: Former Research scientist (life sciences)  . Smokeless tobacco: Never  Used     Comment: Stopped 2004  . Alcohol Use: Yes     Comment: occ   OB History   Grav Para Term Preterm Abortions TAB SAB Ect Mult Living                 Review of Systems  Gastrointestinal: Positive for nausea, vomiting and abdominal pain.  Musculoskeletal: Positive for back pain.  All other systems reviewed and are negative.     Allergies  Review of patient's allergies indicates no known allergies.  Home Medications   Prior to Admission medications   Medication Sig Start Date End Date Taking? Authorizing Provider  carvedilol (COREG) 6.25 MG tablet Take 6.25 mg by mouth 2 (two) times daily with a meal.   Yes Historical Provider, MD  fentaNYL (DURAGESIC - DOSED MCG/HR) 25 MCG/HR patch Place 25 mcg onto the skin every 3 (three) days.   Yes Historical Provider, MD  fentaNYL (DURAGESIC) 50 MCG/HR Place 1 patch (50 mcg total) onto the skin every 3 (three) days. 10/21/13  Yes Malvin Johns, MD  FLUoxetine (PROZAC) 20 MG capsule Take 20 mg by mouth daily.   Yes Historical Provider, MD  lidocaine (LIDODERM) 5 % Place 1 patch onto the skin daily. Remove & Discard patch within 12 hours or as directed by MD   Yes Historical Provider, MD  metoCLOPramide (REGLAN) 5 MG tablet Take 5 mg by mouth 4 (four) times  daily.   Yes Historical Provider, MD  Multiple Vitamins-Minerals (MULTIVITAMIN & MINERAL PO) Take 1 tablet by mouth daily.   Yes Historical Provider, MD  omeprazole (PRILOSEC) 20 MG capsule Take 20 mg by mouth 2 (two) times daily before a meal.   Yes Historical Provider, MD  ondansetron (ZOFRAN-ODT) 8 MG disintegrating tablet Take 8 mg by mouth every 4 (four) hours as needed for nausea or vomiting.   Yes Historical Provider, MD  oxyCODONE (ROXICODONE) 5 MG/5ML solution Take 5 mg by mouth every 3 (three) hours as needed for severe pain or breakthrough pain.   Yes Historical Provider, MD  promethazine (PHENERGAN) 6.25 MG/5ML syrup Take 12.5 mg by mouth every 6 (six) hours as needed for  nausea or vomiting.   Yes Historical Provider, MD  scopolamine (TRANSDERM-SCOP) 1 MG/3DAYS Place 1 patch onto the skin every 3 (three) days.   Yes Historical Provider, MD  sennosides (SENOKOT) 8.8 MG/5ML syrup Take 5 mLs by mouth at bedtime.   Yes Historical Provider, MD  zolpidem (AMBIEN) 10 MG tablet Take 10 mg by mouth at bedtime as needed for sleep.   Yes Historical Provider, MD   BP 144/103  Pulse 106  Resp 16  SpO2 100%  LMP 09/12/2013 Physical Exam  Nursing note and vitals reviewed. Constitutional: She is oriented to person, place, and time. She appears well-developed and well-nourished.  HENT:  Head: Normocephalic and atraumatic.  Right Ear: External ear normal.  Left Ear: External ear normal.  Nose: Nose normal.  Eyes: Right eye exhibits no discharge. Left eye exhibits no discharge.  Cardiovascular: Normal rate, regular rhythm and normal heart sounds.   Pulmonary/Chest: Effort normal and breath sounds normal.  Abdominal: Soft. She exhibits no distension. There is tenderness.  G-J tube in place without drainage  Neurological: She is alert and oriented to person, place, and time.  Skin: Skin is warm and dry.    ED Course  Procedures (including critical care time) Labs Review Labs Reviewed  COMPREHENSIVE METABOLIC PANEL - Abnormal; Notable for the following:    Sodium 136 (*)    Chloride 95 (*)    Glucose, Bld 106 (*)    ALT 72 (*)    Alkaline Phosphatase 139 (*)    Anion gap 17 (*)    All other components within normal limits    Imaging Review Dg Abd Acute W/chest  10/21/2013   CLINICAL DATA:  Gastric carcinoma. Abdominal pain. Vomiting. Cough.  EXAM: ACUTE ABDOMEN SERIES (ABDOMEN 2 VIEW & CHEST 1 VIEW)  COMPARISON:  None.  FINDINGS: Percutaneous gastrostomy tube again seen in left upper quadrant. Surgical clips and staples noted. No evidence of dilated bowel loops or free air.  Mild elevation of right hemidiaphragm and right basilar pleural- parenchymal scarring  appears stable. No evidence of acute infiltrate or edema. No evidence of pleural effusion. Heart size is within normal limits. No mass or lymphadenopathy identified.  IMPRESSION: Unremarkable bowel gas pattern.  No acute findings.  Stable right basilar pleural- parenchymal scarring. No active lung disease.   Electronically Signed   By: Earle Gell M.D.   On: 10/21/2013 14:22   Dg Abd Acute W/chest  10/20/2013   CLINICAL DATA:  Worsening abdominal pain after recent discharge. Recent diagnosis of gastric cancer.  EXAM: ACUTE ABDOMEN SERIES (ABDOMEN 2 VIEW & CHEST 1 VIEW)  COMPARISON:  Acute abdominal series 10/19/2013  FINDINGS: Similar appearance of right basilar scarring. Otherwise, the lungs are clear. Stable cardiac and mediastinal contours. No free  air. Percutaneous gastrostomy tube appears in similar position and projects over the stomach. Staple lines are noted in the left upper quadrant suggesting partial gastric resection. No evidence of ascites or bowel obstruction. No acute osseous abnormality.  IMPRESSION: 1. No acute cardiopulmonary process. 2. Stable right basilar atelectasis versus scarring. 3. Stable position of gastrostomy tube. 4. Nonobstructed bowel gas pattern. 5. No evidence of free air or ascites.   Electronically Signed   By: Jacqulynn Cadet M.D.   On: 10/20/2013 14:54     EKG Interpretation None      MDM   Final diagnoses:  Intractable vomiting with nausea, vomiting of unspecified type    We'll admit the patient to the hospitalist for intractable nausea and vomiting. Patient's had multiple acute abdominal series in the last few days with no signs of obstruction. Enough issues further imaging at this time. Electrolytes are benign at this time.    Ephraim Hamburger, MD 10/22/13 [phone]

## 2013-10-22 NOTE — ED Notes (Signed)
Call to give report to the floor. Nurse unavailable.

## 2013-10-22 NOTE — Progress Notes (Signed)
Pt was sitting up in bed when I arrived. She said she was sleepy. She said she started throwing up again at home and went to [facility] on [address] a couple times. She said she didn't remember who brought her here. Pt said she didn't know why she keeps getting sick at home. Pt appeared to be dozing during visit. She was grateful for my cking on her. Ernest Haber Chaplain  10/22/13 1300  Clinical Encounter Type  Visited With Patient

## 2013-10-23 ENCOUNTER — Inpatient Hospital Stay (HOSPITAL_COMMUNITY): Payer: No Typology Code available for payment source

## 2013-10-23 DIAGNOSIS — E43 Unspecified severe protein-calorie malnutrition: Secondary | ICD-10-CM | POA: Diagnosis present

## 2013-10-23 DIAGNOSIS — D649 Anemia, unspecified: Secondary | ICD-10-CM

## 2013-10-23 MED ORDER — HYDROMORPHONE HCL PF 1 MG/ML IJ SOLN
0.5000 mg | Freq: Four times a day (QID) | INTRAMUSCULAR | Status: DC | PRN
  Administered 2013-10-23 – 2013-10-24 (×3): 0.5 mg via INTRAVENOUS
  Filled 2013-10-23 (×3): qty 1

## 2013-10-23 MED ORDER — PROMETHAZINE HCL 6.25 MG/5ML PO SYRP
12.5000 mg | ORAL_SOLUTION | Freq: Four times a day (QID) | ORAL | Status: DC | PRN
  Administered 2013-10-24: 12.5 mg via ORAL
  Filled 2013-10-23 (×2): qty 10

## 2013-10-23 MED ORDER — JEVITY 1.2 CAL PO LIQD: 1000.0000 mL | ORAL | Status: DC

## 2013-10-23 MED ORDER — PRO-STAT SUGAR FREE PO LIQD
30.0000 mL | Freq: Every morning | ORAL | Status: DC
Start: 2013-10-23 — End: 2013-10-24
  Administered 2013-10-23 – 2013-10-24 (×2): 30 mL
  Filled 2013-10-23 (×2): qty 30

## 2013-10-23 MED ORDER — SODIUM CHLORIDE 0.9 % IV SOLN: INTRAVENOUS | Status: AC

## 2013-10-23 MED ORDER — HYDROCODONE-ACETAMINOPHEN 5-325 MG PO TABS
1.0000 | ORAL_TABLET | Freq: Four times a day (QID) | ORAL | Status: DC | PRN
  Filled 2013-10-23: qty 1

## 2013-10-23 MED ORDER — IOHEXOL 300 MG/ML  SOLN
50.0000 mL | Freq: Once | INTRAMUSCULAR | Status: AC | PRN
  Administered 2013-10-23: 50 mL via ORAL

## 2013-10-23 MED ORDER — OSMOLITE 1.5 CAL PO LIQD
1000.0000 mL | ORAL | Status: DC
Start: 2013-10-23 — End: 2013-10-28
  Administered 2013-10-23 – 2013-10-27 (×2): 1000 mL
  Filled 2013-10-23 (×7): qty 1000

## 2013-10-23 NOTE — Progress Notes (Signed)
PROGRESS NOTE    Paula [name] ZMO:[id number] DOB: Aug 01, 1965 DOA: 10/22/2013 PCP: Arnoldo Morale, MD Oncologist: Dr. Curt Bears  HPI/Brief narrative 48 year old female initially admitted 09/14/13 with one year history of nausea, vomiting and epigastric pain. EGD by Dr. 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 09/20/13, she underwent diagnostic laparoscopy, distal gastrectomy with Billroth II anastomosis and feeding GJ tube by Dr. Barry Dienes. UGI on 10/02/13 showed normal post gastrojejunostomy without perforation. Since then she has had problems with by mouth and tube feeding and has had multiple admissions for abdominal pain, nausea and vomiting. During one such admission, palliative consulted and patient wishes to continue full curative treatment. Dr. [name] saw patient on 7/25 and advised her that she has at least stage III gastric cancer and would definitely need discussion of adjuvant treatment including chemotherapy and XRT-during outpatient followup. She was readmitted with intractable nausea and vomiting.   Assessment/Plan:  1. Intractable nausea and vomiting: Unclear etiology. General surgery following. Followup on UGI results scheduled for today. If negative, consider CT brain with and without contrast to rule out brain metastases as a cause. No acute abdominal signs. KUB-no acute findings. GJ tube position confirmed by imaging. Advised minimizing narcotics. Dr. Dyann Kief discussed with Dr. Penelope Coop, GI who has no further recommendations. Continue IV PPI and Reglan. Currently n.p.o. pending results of UGI results. IV fluids. 2. Gastric cancer, at least stage III: Discussed with Dr. [name] on 9/2 who agrees with CT head if UGI negative and recommends outpatient followup when GI issues have settled for discussion regarding further cancer treatment. 3. Chronic Anemia: no reported bleeding. FU  CBC. 4. Hypertension: Controlled. Continue carvedilol. 5. History of GERD: On IV PPI. 6. Severe malnutrition in the context of chronic illness: Await nutrition management pending GI evaluation as above.   Code Status: Full Family Communication: None at bedside Disposition Plan: Home when medically stable   Consultants:  General surgery  Procedures:  None  Antibiotics:  None   Subjective: Patient says she feels significantly better. Denies vomiting since 8/31. Mild nausea overnight. Passing flatus. No abdominal pain.  Objective: Filed Vitals:   10/22/13 1036 10/22/13 1310 10/22/13 2145 10/23/13 0528  BP: 146/87 118/92 124/79 132/70  Pulse: 77 100 71 80  Temp:  97.6 F (36.4 C) 97.4 F (36.3 C) 98.3 F (36.8 C)  TempSrc:  Oral Oral Oral  Resp:  18 18 18   Height:      Weight:      SpO2:  100% 100% 100%   No intake or output data in the 24 hours ending 10/23/13 1239 Filed Weights   10/22/13 0525  Weight: 67.722 kg (149 lb 4.8 oz)     Exam:  General exam: Pleasant young female seen ambulating comfortably in the room. Respiratory system: Clear. No increased work of breathing. Cardiovascular system: S1 & S2 heard, RRR. No JVD, murmurs, gallops, clicks or pedal edema. Gastrointestinal system: Abdomen is nondistended, soft and nontender. Normal bowel sounds heard. GJ tube site without acute findings. Central nervous system: Alert and oriented. No focal neurological deficits. Extremities: Symmetric 5 x 5 power.   Data Reviewed: Basic Metabolic Panel:  Recent Labs Lab 10/19/13 1820 10/20/13 1410 10/21/13 1200 10/22/13 0153 10/22/13 0650  NA 138 141 136* 136* 134*  K 4.4 4.3 4.3 4.0 4.3  CL 97 98 96 95* 96  CO2 26 26 24 24 23   GLUCOSE 113* 123* 128* 106* 103*  BUN 6  6 6 6  5*  CREATININE 0.90 0.90 0.82 0.74 0.68  CALCIUM 10.1 10.4 9.5 9.6 9.2  MG  --   --   --   --  1.6  PHOS  --   --   --   --  3.6   Liver Function Tests:  Recent Labs Lab  10/19/13 1820 10/20/13 1410 10/21/13 1200 10/22/13 0153 10/22/13 0650  AST 36 33 43* 32 26  ALT 89* 86* 82* 72* 67*  ALKPHOS 137* 147* 140* 139* 126*  BILITOT 0.4 0.5 0.6 0.7 0.6  PROT 7.8 8.5* 8.0 8.2 7.4  ALBUMIN 3.7 4.1 3.8 4.1 3.6    Recent Labs Lab 10/19/13 1820 10/21/13 1200  LIPASE 50 35   No results found for this basename: AMMONIA,  in the last 168 hours CBC:  Recent Labs Lab 10/19/13 1820 10/20/13 1410 10/21/13 1200 10/22/13 0650  WBC 4.5 5.4 4.4 5.5  NEUTROABS 2.3 3.4 3.4  --   HGB 10.0* 10.7* 10.3* 9.5*  HCT 31.3* 33.5* 32.6* 31.0*  MCV 80.5 80.5 79.5 81.4  PLT 329 353 382 307   Cardiac Enzymes:  Recent Labs Lab 10/21/13 1200  TROPONINI <0.30   BNP (last 3 results) No results found for this basename: PROBNP,  in the last 8760 hours CBG:  Recent Labs Lab 10/17/13 0742  GLUCAP 117*    No results found for this or any previous visit (from the past 240 hour(s)).     Studies: Dg Abd Acute W/chest  10/21/2013   CLINICAL DATA:  Gastric carcinoma. Abdominal pain. Vomiting. Cough.  EXAM: ACUTE ABDOMEN SERIES (ABDOMEN 2 VIEW & CHEST 1 VIEW)  COMPARISON:  None.  FINDINGS: Percutaneous gastrostomy tube again seen in left upper quadrant. Surgical clips and staples noted. No evidence of dilated bowel loops or free air.  Mild elevation of right hemidiaphragm and right basilar pleural- parenchymal scarring appears stable. No evidence of acute infiltrate or edema. No evidence of pleural effusion. Heart size is within normal limits. No mass or lymphadenopathy identified.  IMPRESSION: Unremarkable bowel gas pattern.  No acute findings.  Stable right basilar pleural- parenchymal scarring. No active lung disease.   Electronically Signed   By: Earle Gell M.D.   On: 10/21/2013 14:22   Dg Abd Portable 2v  10/22/2013   CLINICAL DATA:  Status post partial gastrectomy for gastric cancer with Billroth II and gastrojejunostomy  EXAM: PORTABLE ABDOMEN - 2 VIEW  COMPARISON:   Abdominal radiographs dated 10/21/2013. CT abdomen pelvis dated 09/13/2013.  FINDINGS: Patient is status post distal gastrectomy with gastrojejunostomy.  Initially, 25 mL contrast was administered via the J-tube. Contrast opacifies the proximal jejunum as well as the residual stomach.  Subsequently, 25 mL contrast was administered via the G-tube lumen. Additional contrast opacifies the residual stomach, confirming intragastric placement.  The distal tip of the J-tube is outlined on the second radiograph and likely resides in the proximal aspect of the jejunum just distal to the gastrojejunostomy.  IMPRESSION: Status post distal gastrectomy with gastrojejunostomy.  Intragastric placement of the gastrostomy tube is confirmed.  Jejunostomy tube likely resides in the proximal jejunum just distal to the gastrojejunostomy.   Electronically Signed   By: Julian Hy M.D.   On: 10/22/2013 16:08        Scheduled Meds: . carvedilol  6.25 mg Oral BID WC  . citalopram  20 mg Oral Daily  . docusate sodium  100 mg Oral BID  . enoxaparin (LOVENOX) injection  40 mg Subcutaneous  Q24H  . fentaNYL  50 mcg Transdermal Q72H  . metoCLOPramide (REGLAN) injection  5 mg Intravenous 4 times per day  . pantoprazole  40 mg Oral Daily  . pantoprazole (PROTONIX) IV  40 mg Intravenous QHS  . scopolamine  1 patch Transdermal Q72H  . sennosides  5 mL Oral QHS  . sodium chloride  3 mL Intravenous Q12H   Continuous Infusions:   Active Problems:   Essential hypertension   Intractable vomiting   Gastric carcinoma s/p Distal gastrectomy/GJ (Billroth II) 09/20/2013   Intractable nausea and vomiting   Protein-calorie malnutrition, severe    Time spent: 30 minutes.    Vernell Leep, MD, FACP, FHM. [facility] Pager [phone]  If 7PM-7AM, please contact night-coverage [email or web address] Password [email or web address] 10/23/2013, 12:39 PM    LOS: 1 day

## 2013-10-23 NOTE — Progress Notes (Signed)
Subjective: Some nausea last PM, no vomiting.  No big complaints today.  Anxious about her scheduled visit with Dr. [name].  We are going to see if he is coming here or if she can go there via wheelchair.     Objective: Vital signs in last 24 hours: Temp:  [97.4 F (36.3 C)-98.3 F (36.8 C)] 98.3 F (36.8 C) (09/02 0528) Pulse Rate:  [71-100] 80 (09/02 0528) Resp:  [18] 18 (09/02 0528) BP: (118-146)/(70-92) 132/70 mmHg (09/02 0528) SpO2:  [100 %] 100 % (09/02 0528) Last BM Date:  (PTA) Nothing recorded yesterday NPO Afebrile, VSS here in hospital, BP up earlier. Prealbumin isn't really to bad, low end of normal value Films didn't really show what we ask for GJ tube looks like it should work. i talked with radiology and they are going to do the rest of the study today. Intake/Output from previous day:   Intake/Output this shift:    General appearance: alert, cooperative and no distress GI: soft, non-tender; bowel sounds normal; no masses,  no organomegaly and gj tube in place.  Lab Results:   Recent Labs  10/21/13 1200 10/22/13 0650  WBC 4.4 5.5  HGB 10.3* 9.5*  HCT 32.6* 31.0*  PLT 382 307    BMET  Recent Labs  10/22/13 0153 10/22/13 0650  NA 136* 134*  K 4.0 4.3  CL 95* 96  CO2 24 23  GLUCOSE 106* 103*  BUN 6 5*  CREATININE 0.74 0.68  CALCIUM 9.6 9.2   PT/INR No results found for this basename: LABPROT, INR,  in the last 72 hours   Recent Labs Lab 10/19/13 1820 10/20/13 1410 10/21/13 1200 10/22/13 0153 10/22/13 0650  AST 36 33 43* 32 26  ALT 89* 86* 82* 72* 67*  ALKPHOS 137* 147* 140* 139* 126*  BILITOT 0.4 0.5 0.6 0.7 0.6  PROT 7.8 8.5* 8.0 8.2 7.4  ALBUMIN 3.7 4.1 3.8 4.1 3.6     Lipase     Component Value Date/Time   LIPASE 35 10/21/2013 1200     Studies/Results: Dg Abd Acute W/chest  10/21/2013   CLINICAL DATA:  Gastric carcinoma. Abdominal pain. Vomiting. Cough.  EXAM: ACUTE ABDOMEN SERIES (ABDOMEN 2 VIEW & CHEST 1 VIEW)   COMPARISON:  None.  FINDINGS: Percutaneous gastrostomy tube again seen in left upper quadrant. Surgical clips and staples noted. No evidence of dilated bowel loops or free air.  Mild elevation of right hemidiaphragm and right basilar pleural- parenchymal scarring appears stable. No evidence of acute infiltrate or edema. No evidence of pleural effusion. Heart size is within normal limits. No mass or lymphadenopathy identified.  IMPRESSION: Unremarkable bowel gas pattern.  No acute findings.  Stable right basilar pleural- parenchymal scarring. No active lung disease.   Electronically Signed   By: Earle Gell M.D.   On: 10/21/2013 14:22   Dg Abd Portable 2v  10/22/2013   CLINICAL DATA:  Status post partial gastrectomy for gastric cancer with Billroth II and gastrojejunostomy  EXAM: PORTABLE ABDOMEN - 2 VIEW  COMPARISON:  Abdominal radiographs dated 10/21/2013. CT abdomen pelvis dated 09/13/2013.  FINDINGS: Patient is status post distal gastrectomy with gastrojejunostomy.  Initially, 25 mL contrast was administered via the J-tube. Contrast opacifies the proximal jejunum as well as the residual stomach.  Subsequently, 25 mL contrast was administered via the G-tube lumen. Additional contrast opacifies the residual stomach, confirming intragastric placement.  The distal tip of the J-tube is outlined on the second radiograph and likely resides in  the proximal aspect of the jejunum just distal to the gastrojejunostomy.  IMPRESSION: Status post distal gastrectomy with gastrojejunostomy.  Intragastric placement of the gastrostomy tube is confirmed.  Jejunostomy tube likely resides in the proximal jejunum just distal to the gastrojejunostomy.   Electronically Signed   By: Julian Hy M.D.   On: 10/22/2013 16:08    Medications: . carvedilol  6.25 mg Oral BID WC  . citalopram  20 mg Oral Daily  . docusate sodium  100 mg Oral BID  . enoxaparin (LOVENOX) injection  40 mg Subcutaneous Q24H  . fentaNYL  50 mcg  Transdermal Q72H  . metoCLOPramide (REGLAN) injection  5 mg Intravenous 4 times per day  . pantoprazole  40 mg Oral Daily  . pantoprazole (PROTONIX) IV  40 mg Intravenous QHS  . scopolamine  1 patch Transdermal Q72H  . sennosides  5 mL Oral QHS  . sodium chloride  3 mL Intravenous Q12H    Assessment/Plan 1. Recurrent nausea/with 1 year history of nausea.  2. 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 - TUMOR INVADES LAMINA PROPRIA;  S/p Diagnostic laparoscopy, distal gastrectomy with billroth II anastamosis and feeding gastrojejunostomy tube, [name], MD, 09/20/13.  2. Hypertension/tachycardia  3. Hx of migraine/resolving subdural hematoma  4. Back pain  5. Incontinence  6. PCM  7. Anemia  8. Post op ileus   Plan:  They will do the rest of the UGI today, and we are going to try and coordinate her visit with oncology.    UGI completed and as expected post op changes as expected, some reflux, no apparent mechanical issues.  I talked with Dr.HONGALGI, he has talked to Dr. [name].  We are going to start TF and drain her stomach straight drain from the gastrostomy port.  He wants to make one change at a time.  We will see how she does with this first.  This historically, is a fairly aggressive cancer to keep this in mind.    LOS: 1 day    Paula [name] 10/23/2013

## 2013-10-23 NOTE — Progress Notes (Signed)
NUTRITION FOLLOW UP/TF CONSULT  Intervention:   -Initiate Osmolite 1.5 @ 10 ml/hr via GJtube and increase by 10 ml every 4 hours to goal rate of 55 ml/hr. 30 ml Prostat once daily.   -Tube feeding regimen provides 2080 kcal (100% of needs), 98 grams of protein, and 1008 ml of H2O. Will need additional 800 ml free water. -Diet advancement per MD -Renee Pain supplement as tolerated -Will continue to monitor  Nutrition Dx:   Inadequate oral intake related to inability to eat as evidenced by NPO status   Goal:   TF to meet >/= 90% of their estimated nutrition needs    Monitor:   TF tolerance, total protein/energy intake, labs, weights, diet order, GI profile  Assessment:   9/01: -Diet recall indicated pt consuming snack-like meals- several bites of cereal w/milk, juices, yogurt, softened proteins (chicken), and ice cream. Was not consuming Unjury supplement as pt did not know where to procure them. Reinforced [facility] outpatient to purchase them upon d/c as warranted  -Educated pt on minimizing intake of high fat foods during episodes of nausea. Would also benefit from limiting high sugar foods d/t recent gastrectomy surgery and increased risk of dumping syndrome  -Endorsed ongoing weight loss of 20-30 lbs since 08/2013 d/t frequent hospitalizations and sub-optimal nutrition  -Pt currently NPO and bowel rest d/t ongoing nausea/vomiting. Consider nutrition support as pt unlikley able to sustain nutritional status via PO intake. Has GJ tube, pt has trialed tube feeding during previous admits; however pt was unable to tolerate and experienced nausea and vomiting even at low rate levels  -Pt with constipation; has not had BM since 8/29  9/02: -UGI completed, MD noted some reflux; however no post-operative anatomic findings to explain nausea or emesis -Pt with some nausea, but no emesis since 8/31 -GJ tube placement confirmed, plan to initiate TF via GJ tube and advance PO diet as tolerated -Last BM on  8/29, passing flatus -Phos/Mg/K WNL, monitor for refeeding risk d/t prolonged sub-optimal nutrition intake and weight loss. Has hx of TF intolerance, will start at low dose and opt for higher calorically dense formula.  Height: Ht Readings from Last 1 Encounters:  10/22/13 5' (1.524 m)    Weight Status:   Wt Readings from Last 1 Encounters:  10/22/13 149 lb 4.8 oz (67.722 kg)    Re-estimated needs:  Kcal: 1800-2000  Protein: 85-95 gram  Fluid: >/=1800 ml/daily   Skin: WDL  Diet Order: NPO   Intake/Output Summary (Last 24 hours) at 10/23/13 1613 Last data filed at 10/23/13 1430  Gross per 24 hour  Intake  93.75 ml  Output      0 ml  Net  93.75 ml    Last BM: 8/29   Labs:   Recent Labs Lab 10/21/13 1200 10/22/13 0153 10/22/13 0650  NA 136* 136* 134*  K 4.3 4.0 4.3  CL 96 95* 96  CO2 24 24 23   BUN 6 6 5*  CREATININE 0.82 0.74 0.68  CALCIUM 9.5 9.6 9.2  MG  --   --  1.6  PHOS  --   --  3.6  GLUCOSE 128* 106* 103*    CBG (last 3)  No results found for this basename: GLUCAP,  in the last 72 hours  Scheduled Meds: . carvedilol  6.25 mg Oral BID WC  . docusate sodium  100 mg Oral BID  . enoxaparin (LOVENOX) injection  40 mg Subcutaneous Q24H  . fentaNYL  50 mcg Transdermal Q72H  .  metoCLOPramide (REGLAN) injection  5 mg Intravenous 4 times per day  . pantoprazole (PROTONIX) IV  40 mg Intravenous QHS  . scopolamine  1 patch Transdermal Q72H  . sennosides  5 mL Oral QHS  . sodium chloride  3 mL Intravenous Q12H    Continuous Infusions: . sodium chloride 75 mL/hr at 10/23/13 [place] LDN Clinical Dietitian [phone]

## 2013-10-23 NOTE — Progress Notes (Signed)
General Surgery [facility]  Reviewed UGI series and plain x-rays.  No post-operative anatomic findings to explain nausea or emesis.  May use G-tube for enteral feedings as needed.  Earnstine Regal, MD, [facility] Office: [phone]

## 2013-10-23 NOTE — Progress Notes (Signed)
GI Location of Tumor / Histology: Gastric Carcinoma-Poorly Dfferentiated Adenocarcinoma with Signet Ring Cells   Paula [name] presented on 09/11/13 to the ED with N/V, acute onset. Belly exam with generalized pain istory of GERD and remote gastric ulcers, admitted on 7/24 with progressive nausea, abdominal pain, bilateral chest wall pain, and migraine headaches, complicated with non-bloody, bilious intractable vomiting on the day of presentation. These symptoms were initially present over a period of one year, but they were intermittent and self resolving. Last event prior to this admission was in May of 2015. She has been seen at the ED 4 times consecutively without clear explanation except for possible costochondritis, since 7/18. As symptoms were persistent, she was admitted for further workup.   Biopsies   Diagnosis 1. 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 - TUMOR INVADES LAMINA PROPRIA. - RESECTION MARGINS NEGATIVE FOR TUMOR. - METASTATIC CARCINOMA IN THREE OF FIVE LYMPH NODES (3/5). - CHRONIC ACTIVE GASTRITIS WITH HELICOBACTER PYLORI. - FRAGMENT OF BENIGN PANCREATIC TISSUE. - SEE ONCOLOGY TABLE. 2. Lymph node, biopsy, peri-gastric - METASTATIC CARCINOMA IN ONE OF SIX LYMPH NODES (1/6). - LIPOGRANULOMATOUS LYMPHADENOPATHY. - SEE COMMENT. 3. Omentum, resection for tumor - FAT NECROSIS. - NO MALIGNANCY IDENTIFIED.   Past/Anticipated interventions by surgeon, if any: Diagnostic laparoscopy, distal gastrectomy with billroth II anastamosis and feeding gastrojejunostomy tube, [name], MD, 09/20/13.    Past/Anticipated interventions by medical oncology, if any: Dr. Curt Bears - Planned Chmotherapy  Weight changes, if any:   Bowel/Bladder complaints, if any:  Nausea / Vomiting, if any:   Pain issues, if any: Abdominal pain  SAFETY ISSUES:  Prior radiation? NO  Pacemaker/ICD?NO  Possible  current pregnancy?NO      patient on methotrexate? NO Current Complaints / other details:    She has a family history of malignancy in several members including an uncle and a niece with stomach cancer

## 2013-10-24 ENCOUNTER — Inpatient Hospital Stay (HOSPITAL_COMMUNITY): Payer: No Typology Code available for payment source

## 2013-10-24 LAB — GLUCOSE, CAPILLARY
GLUCOSE-CAPILLARY: 104 mg/dL — AB (ref 70–99)
GLUCOSE-CAPILLARY: 117 mg/dL — AB (ref 70–99)
Glucose-Capillary: 105 mg/dL — ABNORMAL HIGH (ref 70–99)
Glucose-Capillary: 105 mg/dL — ABNORMAL HIGH (ref 70–99)
Glucose-Capillary: 136 mg/dL — ABNORMAL HIGH (ref 70–99)
Glucose-Capillary: 152 mg/dL — ABNORMAL HIGH (ref 70–99)
Glucose-Capillary: 92 mg/dL (ref 70–99)

## 2013-10-24 LAB — CBC
HCT: 26.4 % — ABNORMAL LOW (ref 36.0–46.0)
HEMOGLOBIN: 8.5 g/dL — AB (ref 12.0–15.0)
MCH: 25.8 pg — ABNORMAL LOW (ref 26.0–34.0)
MCHC: 32.2 g/dL (ref 30.0–36.0)
MCV: 80 fL (ref 78.0–100.0)
Platelets: 279 10*3/uL (ref 150–400)
RBC: 3.3 MIL/uL — AB (ref 3.87–5.11)
RDW: 14.6 % (ref 11.5–15.5)
WBC: 4.4 10*3/uL (ref 4.0–10.5)

## 2013-10-24 LAB — COMPREHENSIVE METABOLIC PANEL
ALK PHOS: 97 U/L (ref 39–117)
ALT: 44 U/L — AB (ref 0–35)
ANION GAP: 12 (ref 5–15)
AST: 16 U/L (ref 0–37)
Albumin: 3 g/dL — ABNORMAL LOW (ref 3.5–5.2)
BILIRUBIN TOTAL: 0.3 mg/dL (ref 0.3–1.2)
BUN: 4 mg/dL — AB (ref 6–23)
CO2: 25 meq/L (ref 19–32)
Calcium: 8.8 mg/dL (ref 8.4–10.5)
Chloride: 100 mEq/L (ref 96–112)
Creatinine, Ser: 0.8 mg/dL (ref 0.50–1.10)
GFR calc Af Amer: >90 mL/min (ref >90)
GFR, EST NON AFRICAN AMERICAN: 87 mL/min — AB (ref >90)
GLUCOSE: 130 mg/dL — AB (ref 70–99)
POTASSIUM: 3.9 meq/L (ref 3.7–5.3)
SODIUM: 137 meq/L (ref 137–147)
TOTAL PROTEIN: 6.1 g/dL (ref 6.0–8.3)

## 2013-10-24 MED ORDER — PANTOPRAZOLE SODIUM 40 MG PO TBEC
40.0000 mg | DELAYED_RELEASE_TABLET | Freq: Two times a day (BID) | ORAL | Status: DC
  Administered 2013-10-24 – 2013-11-05 (×20): 40 mg via ORAL
  Filled 2013-10-24 (×27): qty 1

## 2013-10-24 MED ORDER — HYDROMORPHONE HCL PF 1 MG/ML IJ SOLN
0.5000 mg | INTRAMUSCULAR | Status: DC | PRN
  Administered 2013-10-24 – 2013-10-26 (×8): 0.5 mg via INTRAVENOUS
  Filled 2013-10-24 (×8): qty 1

## 2013-10-24 MED ORDER — PRO-STAT SUGAR FREE PO LIQD
30.0000 mL | Freq: Three times a day (TID) | ORAL | Status: DC
  Administered 2013-10-25: 30 mL
  Filled 2013-10-24 (×14): qty 30

## 2013-10-24 MED ORDER — PROMETHAZINE HCL 25 MG/ML IJ SOLN
12.5000 mg | Freq: Four times a day (QID) | INTRAMUSCULAR | Status: DC | PRN
  Administered 2013-10-24 – 2013-10-28 (×4): 25 mg via INTRAVENOUS
  Administered 2013-10-30 – 2013-10-31 (×2): 12.5 mg via INTRAVENOUS
  Administered 2013-10-31 – 2013-11-02 (×5): 25 mg via INTRAVENOUS
  Filled 2013-10-24 (×11): qty 1

## 2013-10-24 MED ORDER — IOHEXOL 300 MG/ML  SOLN
100.0000 mL | Freq: Once | INTRAMUSCULAR | Status: AC | PRN
  Administered 2013-10-24: 100 mL via INTRAVENOUS

## 2013-10-24 MED ORDER — SODIUM CHLORIDE 0.9 % IV SOLN
INTRAVENOUS | Status: AC
  Administered 2013-10-25: 05:00:00 via INTRAVENOUS

## 2013-10-24 MED ORDER — UNJURY CHICKEN SOUP POWDER
1.0000 | Freq: Every morning | ORAL | Status: DC
  Administered 2013-10-25: 1 via ORAL
  Filled 2013-10-24 (×4): qty 27

## 2013-10-24 NOTE — Progress Notes (Signed)
Subjective: Complaining of nausea and spitting into a green bag.  For some reason the gastrostomy port is open and she's holding it.  She says she got sick when TF went to 30.  It is off now.  Objective: Vital signs in last 24 hours: Temp:  [98.1 F (36.7 C)-98.4 F (36.9 C)] 98.4 F (36.9 C) (09/03 0414) Pulse Rate:  [66-94] 83 (09/03 0414) Resp:  [16-18] 16 (09/03 0414) BP: (131-154)/(74-84) 131/83 mmHg (09/03 0414) SpO2:  [100 %] 100 % (09/03 0414) Weight:  [68.04 kg (150 lb)] 68.04 kg (150 lb) (09/03 0414) Last BM Date: 10/19/13 2 stools recorded yesteday Afebrile, VSS H/H is down some otherwise labs are OK Intake/Output from previous day: 09/02 0701 - 09/03 0700 In: 93.8 [I.V.:93.8] Out: -  Intake/Output this shift:    General appearance: alert, mild distress and moaning GI: soft, non-tender; bowel sounds normal; no masses,  no organomegaly and TF started, gastrotomy portion just open.   UGI shows no obstruction or mechanical issues. Lab Results:   Recent Labs  10/22/13 0650 10/24/13 0508  WBC 5.5 4.4  HGB 9.5* 8.5*  HCT 31.0* 26.4*  PLT 307 279    BMET  Recent Labs  10/22/13 0650 10/24/13 0508  NA 134* 137  K 4.3 3.9  CL 96 100  CO2 23 25  GLUCOSE 103* 130*  BUN 5* 4*  CREATININE 0.68 0.80  CALCIUM 9.2 8.8   PT/INR No results found for this basename: LABPROT, INR,  in the last 72 hours   Recent Labs Lab 10/20/13 1410 10/21/13 1200 10/22/13 0153 10/22/13 0650 10/24/13 0508  AST 33 43* 32 26 16  ALT 86* 82* 72* 67* 44*  ALKPHOS 147* 140* 139* 126* 97  BILITOT 0.5 0.6 0.7 0.6 0.3  PROT 8.5* 8.0 8.2 7.4 6.1  ALBUMIN 4.1 3.8 4.1 3.6 3.0*     Lipase     Component Value Date/Time   LIPASE 35 10/21/2013 1200     Studies/Results: Dg Abd Portable 2v  10/22/2013   CLINICAL DATA:  Status post partial gastrectomy for gastric cancer with Billroth II and gastrojejunostomy  EXAM: PORTABLE ABDOMEN - 2 VIEW  COMPARISON:  Abdominal radiographs  dated 10/21/2013. CT abdomen pelvis dated 09/13/2013.  FINDINGS: Patient is status post distal gastrectomy with gastrojejunostomy.  Initially, 25 mL contrast was administered via the J-tube. Contrast opacifies the proximal jejunum as well as the residual stomach.  Subsequently, 25 mL contrast was administered via the G-tube lumen. Additional contrast opacifies the residual stomach, confirming intragastric placement.  The distal tip of the J-tube is outlined on the second radiograph and likely resides in the proximal aspect of the jejunum just distal to the gastrojejunostomy.  IMPRESSION: Status post distal gastrectomy with gastrojejunostomy.  Intragastric placement of the gastrostomy tube is confirmed.  Jejunostomy tube likely resides in the proximal jejunum just distal to the gastrojejunostomy.   Electronically Signed   By: Julian Hy M.D.   On: 10/22/2013 16:08   Dg Ugi W/water Sol Cm  10/23/2013   CLINICAL DATA:  History of gastric cancer, status post gastrojejunostomy.  EXAM: WATER SOLUBLE UPPER GI SERIES  TECHNIQUE: Single-column upper GI series was performed using water soluble contrast.  CONTRAST:  55mL OMNIPAQUE IOHEXOL 300 MG/ML  SOLN  COMPARISON:  None.  FLUOROSCOPY TIME:  2 min 5 seconds  FINDINGS: On the scout radiograph a gastrostomy tube is in place. The contrast material placed within the stomach on 10/22/2013 has progressed into the colon.  The patient ingested 50 cc of water-soluble contrast material. The esophagus is patent. There is no evidence of esophageal mass or stricture. Postoperative appearance of the stomach compatible with gastrojejunostomy. The gastro jejunal anastomosis is patent.  When the patient is in the recumbent orientation several episodes of gastroesophageal reflux noted.  IMPRESSION: 1. Patent esophagus and gastrojejunal anastomosis. 2. Reflux.   Electronically Signed   By: Kerby Moors M.D.   On: 10/23/2013 13:00    Medications: . carvedilol  6.25 mg Oral BID WC   . docusate sodium  100 mg Oral BID  . enoxaparin (LOVENOX) injection  40 mg Subcutaneous Q24H  . feeding supplement (PRO-STAT SUGAR FREE 64)  30 mL Per Tube q morning - 10a  . fentaNYL  50 mcg Transdermal Q72H  . metoCLOPramide (REGLAN) injection  5 mg Intravenous 4 times per day  . pantoprazole  40 mg Oral BID AC  . scopolamine  1 patch Transdermal Q72H  . sennosides  5 mL Oral QHS  . sodium chloride  3 mL Intravenous Q12H    Assessment/Plan 1. Recurrent nausea/with 1 year history of nausea.  2. 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  - TUMOR INVADES LAMINA PROPRIA;  S/p Diagnostic laparoscopy, distal gastrectomy with billroth II anastamosis and feeding gastrojejunostomy tube, [name], MD, 09/20/13.   2. Hypertension/tachycardia  3. Hx of migraine/resolving subdural hematoma  4. Back pain  5. Incontinence  6. PCM  7. Anemia  8. Post op ileus      Plan:  I have re attatched foley bag to the gastrostomy port.   CT of the head is pending.  Nothing surgical for [place] to do at this point  LOS: 2 days    Paula [name] 10/24/2013

## 2013-10-24 NOTE — Progress Notes (Signed)
NUTRITION FOLLOW UP  Intervention:   -Per surgery: re-start Osmolite 1.5 at 10 ml/hr and advance to 20 ml/hr. Run for 24 hours and assess tolerance before further advancement -Recommend to increase Pro-stat to TID via GJtube -TF+ supplement regimen will provide 1020 kcal, 75 gram protein, 366 ml free water -Unjury chicken broth supplement once daily to provide 100 kcal, 21 gram protein -Will continue to monitor  Nutrition Dx:   Inadequate oral intake related to inability to eat as evidenced by NPO status   Goal:   TF to meet >/= 90% of their estimated nutrition needs    Monitor:   TF tolerance, total protein/energy intake, labs, weights, diet order, GI profile  Assessment:   9/01: -Diet recall indicated pt consuming snack-like meals- several bites of cereal w/milk, juices, yogurt, softened proteins (chicken), and ice cream. Was not consuming Unjury supplement as pt did not know where to procure them. Reinforced [facility] outpatient to purchase them upon d/c as warranted  -Educated pt on minimizing intake of high fat foods during episodes of nausea. Would also benefit from limiting high sugar foods d/t recent gastrectomy surgery and increased risk of dumping syndrome  -Endorsed ongoing weight loss of 20-30 lbs since 08/2013 d/t frequent hospitalizations and sub-optimal nutrition  -Pt currently NPO and bowel rest d/t ongoing nausea/vomiting. Consider nutrition support as pt unlikley able to sustain nutritional status via PO intake. Has GJ tube, pt has trialed tube feeding during previous admits; however pt was unable to tolerate and experienced nausea and vomiting even at low rate levels  -Pt with constipation; has not had BM since 8/29  9/02: -UGI completed, MD noted some reflux; however no post-operative anatomic findings to explain nausea or emesis -Pt with some nausea, but no emesis since 8/31 -GJ tube placement confirmed, plan to initiate TF via GJ tube and advance PO diet as  tolerated -Last BM on 8/29, passing flatus -Phos/Mg/K WNL, monitor for refeeding risk d/t prolonged sub-optimal nutrition intake and weight loss. Has hx of TF intolerance, will start at low dose and opt for higher calorically dense formula.  9/03: -Osmolite 1.5 initiated at 10 ml/hr, tolerated advancement to 20 ml/hr. Pt w/feelings of nausea and emesis when TF at 30 ml/hr -Surgery recommends to run TF at 20 ml/hr for 24 hours, and reassess advancement/tolerance. Will increase Pro-Stat to TID for additional protein while TF running at low rate -Diet advanced to clear liquids. Will order Unjury Chicken Broth once daily to provide more nutrient dense option  Height: Ht Readings from Last 1 Encounters:  10/22/13 5' (1.524 m)    Weight Status:   Wt Readings from Last 1 Encounters:  10/24/13 150 lb (68.04 kg)    Re-estimated needs:  Kcal: 1800-2000  Protein: 85-95 gram  Fluid: >/=1800 ml/daily   Skin: WDL  Diet Order: Clear Liquid   Intake/Output Summary (Last 24 hours) at 10/24/13 1316 Last data filed at 10/24/13 0415  Gross per 24 hour  Intake  93.75 ml  Output      0 ml  Net  93.75 ml    Last BM: 8/29   Labs:   Recent Labs Lab 10/22/13 0153 10/22/13 0650 10/24/13 0508  NA 136* 134* 137  K 4.0 4.3 3.9  CL 95* 96 100  CO2 24 23 25   BUN 6 5* 4*  CREATININE 0.74 0.68 0.80  CALCIUM 9.6 9.2 8.8  MG  --  1.6  --   PHOS  --  3.6  --  GLUCOSE 106* 103* 130*    CBG (last 3)   Recent Labs  10/24/13 0001 10/24/13 0411 10/24/13 0824  GLUCAP 105* 136* 152*    Scheduled Meds: . carvedilol  6.25 mg Oral BID WC  . docusate sodium  100 mg Oral BID  . enoxaparin (LOVENOX) injection  40 mg Subcutaneous Q24H  . feeding supplement (PRO-STAT SUGAR FREE 64)  30 mL Per Tube q morning - 10a  . fentaNYL  50 mcg Transdermal Q72H  . metoCLOPramide (REGLAN) injection  5 mg Intravenous 4 times per day  . pantoprazole  40 mg Oral BID AC  . scopolamine  1 patch Transdermal  Q72H  . sennosides  5 mL Oral QHS  . sodium chloride  3 mL Intravenous Q12H    Continuous Infusions: . feeding supplement (OSMOLITE 1.5 CAL) 1,000 mL (10/23/13 1700)    Atlee Abide MS RD LDN Clinical Dietitian [phone]

## 2013-10-24 NOTE — Consult Note (Signed)
Subjective:   HPI The patient is a 48 year old female who was admitted to the hospital in July of this year with a one-year history of nausea vomiting and epigastric pain. 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 She underwent a diagnostic laparoscopy, and distal gastrectomy with Billroth II anastomosis and a feeding GJ tube. She had an upper GI on April 15 which showed normal post gastrojejunostomy without perforation. Since then she has had problems with oral as well as tube feedings and multiple repeat admissions for abdominal pain nausea and vomiting. We are asked to see her because of recurrent intractable vomiting and nausea when her tube feedings are turned up. There is no sign on any x-rays of obstruction. G. J-tube position is fine. When her tube feedings are turned up above 20 mL per hour which she tolerates she starts to become nauseated and vomits area at a CT scan of the head was done and did not show any evidence of metastases. Reglan has been tried but doesn't seem to help her situation.  Review of Systems Denies chest pain or shortness of breath  Past Medical History  Diagnosis Date  . Headache(784.0) 2011    Migraines  . Esophageal reflux     On omeprazole  . Costochondritis   . Hypertension     started around 2011  . Ovarian cyst   . Stomach cancer    Past Surgical History  Procedure Laterality Date  . Oophorectomy      Around 1985 left ovary removal  . Tonsillectomy      Around 1994  . Tubal ligation    . Esophagogastroduodenoscopy N/A 09/16/2013    Procedure: ESOPHAGOGASTRODUODENOSCOPY (EGD);  Surgeon: Wonda Horner, MD;  Location: [facility];  Service: Endoscopy;  Laterality: N/A;  . Laparoscopy N/A 09/20/2013    Procedure: DIAGNOSTIC LAPAROSCOPY,DISTAL GASTRECTOMY AND FEEDING GASTROJEJUNOSTOMY;  Surgeon: [name], MD;  Location: [facility];  Service: General;   Laterality: N/A;   History   Social History  . Marital Status: Married    Spouse Name: N/A    Number of Children: 2  . Years of Education: N/A   Occupational History  .  [place]   Social History Main Topics  . Smoking status: Former Research scientist (life sciences)  . Smokeless tobacco: Never Used     Comment: Stopped 2004  . Alcohol Use: Yes     Comment: occ  . Drug Use: No  . Sexual Activity: Not on file   Other Topics Concern  . Not on file   Social History Narrative  . No narrative on file   family history includes Diabetes Mellitus II in her mother; Heart failure in her maternal grandmother; Hypertension in her brother, maternal grandfather, mother, and sister. Current facility-administered medications:0.9 %  sodium chloride infusion, , Intravenous, Continuous, Modena Jansky, MD, Last Rate: 50 mL/hr at 10/24/13 1409;  acetaminophen (TYLENOL) suppository 650 mg, 650 mg, Rectal, Q6H PRN, Toy Baker, MD;  acetaminophen (TYLENOL) tablet 650 mg, 650 mg, Oral, Q6H PRN, Toy Baker, MD;  carvedilol (COREG) tablet 6.25 mg, 6.25 mg, Oral, BID WC, Toy Baker, MD, 6.25 mg at 10/24/13 0810 docusate sodium (COLACE) capsule 100 mg, 100 mg, Oral, BID, Toy Baker, MD, 100 mg at 10/24/13 0954;  enoxaparin (LOVENOX) injection 40 mg, 40 mg, Subcutaneous, Q24H, Toy Baker, MD, 40 mg at 10/24/13 0952;  feeding supplement (OSMOLITE 1.5 CAL) liquid 1,000 mL, 1,000 mL,  Per Tube, Continuous, Hazle Coca, RD, Last Rate: 10 mL/hr at 10/23/13 1700, 1,000 mL at 10/23/13 1700 feeding supplement (PRO-STAT SUGAR FREE 64) liquid 30 mL, 30 mL, Per Tube, TID PC, Hazle Coca, RD;  fentaNYL (DURAGESIC - dosed mcg/hr) patch 50 mcg, 50 mcg, Transdermal, Q72H, Toy Baker, MD;  HYDROcodone-acetaminophen (NORCO/VICODIN) 5-325 MG per tablet 1 tablet, 1 tablet, Oral, Q6H PRN, Modena Jansky, MD;  HYDROmorphone (DILAUDID) injection 0.5 mg, 0.5 mg, Intravenous, Q4H PRN,  Modena Jansky, MD, 0.5 mg at 10/24/13 1543 metoCLOPramide (REGLAN) injection 5 mg, 5 mg, Intravenous, 4 times per day, Toy Baker, MD, 5 mg at 10/24/13 1408;  ondansetron (ZOFRAN) injection 4 mg, 4 mg, Intravenous, Q6H PRN, Toy Baker, MD, 4 mg at 10/24/13 1103;  ondansetron (ZOFRAN) tablet 4 mg, 4 mg, Oral, Q6H PRN, Toy Baker, MD;  pantoprazole (PROTONIX) EC tablet 40 mg, 40 mg, Oral, BID AC, Modena Jansky, MD, 40 mg at 10/24/13 0951 promethazine (PHENERGAN) injection 12.5-25 mg, 12.5-25 mg, Intravenous, Q6H PRN, Modena Jansky, MD, 25 mg at 10/24/13 1544;  [START ON 10/25/2013] protein supplement (UNJURY CHICKEN SOUP) powder 1 packet, 1 packet, Oral, q morning - 10a, Hazle Coca, RD;  scopolamine (TRANSDERM-SCOP) 1 MG/3DAYS 1.5 mg, 1 patch, Transdermal, Q72H, Toy Baker, MD;  sennosides (SENOKOT) 8.8 MG/5ML syrup 5 mL, 5 mL, Oral, QHS, Anastassia Doutova, MD sodium chloride 0.9 % injection 3 mL, 3 mL, Intravenous, Q12H, Toy Baker, MD;  zolpidem (AMBIEN) tablet 5 mg, 5 mg, Oral, QHS PRN, Toy Baker, MD No Known Allergies   Objective:     BP 163/103  Pulse 100  Temp(Src) 98.4 F (36.9 C) (Oral)  Resp 18  Ht 5' (1.524 m)  Wt 68.04 kg (150 lb)  BMI 29.30 kg/m2  SpO2 100%  LMP 09/12/2013  She is in no distress  Heart regular rhythm no murmurs  Lungs clear  Abdomen: Bowel sounds present, soft, GJ tube in left upper quadrant of abdomen. No pain on palpation.  Laboratory No components found with this basename: d1      Assessment:     Gastric cancer status post surgery as mentioned above. Recurring nausea and vomiting when tube feedings are turned up. No evidence of obstruction. No obvious reason for nausea or vomiting.        Plan:     I have no explanation for why this patient has nausea and vomiting when her tube feedings are turned up to 30 mL per hour. It appears that she has volume sensitivity to the feedings. I  would recommend trying to use nutritional supplements that have the highest calorie count per milliliter and see how she does. If we are unable to adequately feed her through the GI tract then we will have to consider long-term TPN. Lab Results  Component Value Date   HGB 8.5* 10/24/2013   HGB 9.5* 10/22/2013   HGB 10.3* 10/21/2013   HCT 26.4* 10/24/2013   HCT 31.0* 10/22/2013   HCT 32.6* 10/21/2013   ALKPHOS 97 10/24/2013   ALKPHOS 126* 10/22/2013   ALKPHOS 139* 10/22/2013   AST 16 10/24/2013   AST 26 10/22/2013   AST 32 10/22/2013   ALT 44* 10/24/2013   ALT 67* 10/22/2013   ALT 72* 10/22/2013

## 2013-10-24 NOTE — ED Provider Notes (Signed)
CSN: [id number]     Arrival date & time 10/20/13  1307 History   First MD Initiated Contact with Patient 10/20/13 1332     Chief Complaint  Patient presents with  . Abdominal Pain      HPI   Pt presents with complaint of AP and vomiting.  Recent diagnosis, and resection of gastric cancer.  Has had multiple episodes of emesis the pain essentially since leaving the hospital. States she ate last night and has some pain and vomiting this morning. No hematemesis. No fevers or chills. Has had a bowel movement this morning. No diarrhea. Past Medical History  Diagnosis Date  . Headache(784.0) 2011    Migraines  . Esophageal reflux     On omeprazole  . Costochondritis   . Hypertension     started around 2011  . Ovarian cyst   . Stomach cancer    Past Surgical History  Procedure Laterality Date  . Oophorectomy      Around 1985 left ovary removal  . Tonsillectomy      Around 1994  . Tubal ligation    . Esophagogastroduodenoscopy N/A 09/16/2013    Procedure: ESOPHAGOGASTRODUODENOSCOPY (EGD);  Surgeon: Wonda Horner, MD;  Location: [facility];  Service: Endoscopy;  Laterality: N/A;  . Laparoscopy N/A 09/20/2013    Procedure: DIAGNOSTIC LAPAROSCOPY,DISTAL GASTRECTOMY AND FEEDING GASTROJEJUNOSTOMY;  Surgeon: [name], MD;  Location: [facility];  Service: General;  Laterality: N/A;   Family History  Problem Relation Age of Onset  . Hypertension Mother   . Diabetes Mellitus II Mother   . Hypertension Sister   . Hypertension Brother   . Heart failure Maternal Grandmother   . Hypertension Maternal Grandfather    History  Substance Use Topics  . Smoking status: Former Research scientist (life sciences)  . Smokeless tobacco: Never Used     Comment: Stopped 2004  . Alcohol Use: Yes     Comment: occ   OB History   Grav Para Term Preterm Abortions TAB SAB Ect Mult Living                 Review of Systems  Constitutional: Negative for fever, chills, diaphoresis, appetite change and fatigue.  HENT: Negative  for mouth sores, sore throat and trouble swallowing.   Eyes: Negative for visual disturbance.  Respiratory: Negative for cough, chest tightness, shortness of breath and wheezing.   Cardiovascular: Negative for chest pain.  Gastrointestinal: Positive for nausea, vomiting and abdominal pain. Negative for diarrhea and abdominal distention.  Endocrine: Negative for polydipsia, polyphagia and polyuria.  Genitourinary: Negative for dysuria, frequency and hematuria.  Musculoskeletal: Negative for gait problem.  Skin: Negative for color change, pallor and rash.  Neurological: Negative for dizziness, syncope, light-headedness and headaches.  Hematological: Does not bruise/bleed easily.  Psychiatric/Behavioral: Negative for behavioral problems and confusion.      Allergies  Review of patient's allergies indicates no known allergies.  Home Medications   Prior to Admission medications   Medication Sig Start Date End Date Taking? Authorizing Provider  carvedilol (COREG) 6.25 MG tablet Take 6.25 mg by mouth 2 (two) times daily with a meal.    Historical Provider, MD  fentaNYL (DURAGESIC - DOSED MCG/HR) 25 MCG/HR patch Place 25 mcg onto the skin every 3 (three) days.    Historical Provider, MD  fentaNYL (DURAGESIC) 50 MCG/HR Place 1 patch (50 mcg total) onto the skin every 3 (three) days. 10/21/13   Malvin Johns, MD  lidocaine (LIDODERM) 5 % Place 1 patch onto the  skin daily. Remove & Discard patch within 12 hours or as directed by MD    Historical Provider, MD  metoCLOPramide (REGLAN) 5 MG tablet Take 5 mg by mouth 4 (four) times daily.    Historical Provider, MD  Multiple Vitamins-Minerals (MULTIVITAMIN & MINERAL PO) Take 1 tablet by mouth daily.    Historical Provider, MD  omeprazole (PRILOSEC) 20 MG capsule Take 20 mg by mouth 2 (two) times daily before a meal.    Historical Provider, MD  ondansetron (ZOFRAN-ODT) 8 MG disintegrating tablet Take 8 mg by mouth every 4 (four) hours as needed for  nausea or vomiting.    Historical Provider, MD  oxyCODONE (ROXICODONE) 5 MG/5ML solution Take 5 mg by mouth every 3 (three) hours as needed for severe pain or breakthrough pain.    Historical Provider, MD  promethazine (PHENERGAN) 6.25 MG/5ML syrup Take 12.5 mg by mouth every 6 (six) hours as needed for nausea or vomiting.    Historical Provider, MD  scopolamine (TRANSDERM-SCOP) 1 MG/3DAYS Place 1 patch onto the skin every 3 (three) days.    Historical Provider, MD  sennosides (SENOKOT) 8.8 MG/5ML syrup Take 5 mLs by mouth at bedtime.    Historical Provider, MD  zolpidem (AMBIEN) 10 MG tablet Take 10 mg by mouth at bedtime as needed for sleep.    Historical Provider, MD   BP 152/84  Pulse 109  Temp(Src) 98.6 F (37 C) (Oral)  Resp 18  Ht 5' (1.524 m)  Wt 138 lb (62.596 kg)  BMI 26.95 kg/m2  SpO2 100%  LMP 09/12/2013 Physical Exam  Constitutional: She is oriented to person, place, and time. She appears well-developed and well-nourished. No distress.  HENT:  Head: Normocephalic.  Eyes: Conjunctivae are normal. Pupils are equal, round, and reactive to light. No scleral icterus.  Neck: Normal range of motion. Neck supple. No thyromegaly present.  Cardiovascular: Normal rate and regular rhythm.  Exam reveals no gallop and no friction rub.   No murmur heard. Pulmonary/Chest: Effort normal and breath sounds normal. No respiratory distress. She has no wheezes. She has no rales.  Abdominal: Soft. Bowel sounds are normal. She exhibits no distension. There is no tenderness. There is no rebound.  Well-healed incision. Normal active bowel sounds. Not distended.  Musculoskeletal: Normal range of motion.  Neurological: She is alert and oriented to person, place, and time.  Skin: Skin is warm and dry. No rash noted.  Psychiatric: She has a normal mood and affect. Her behavior is normal.    ED Course  Procedures (including critical care time) Labs Review Labs Reviewed  CBC WITH DIFFERENTIAL -  Abnormal; Notable for the following:    Hemoglobin 10.7 (*)    HCT 33.5 (*)    MCH 25.7 (*)    All other components within normal limits  COMPREHENSIVE METABOLIC PANEL - Abnormal; Notable for the following:    Glucose, Bld 123 (*)    Total Protein 8.5 (*)    ALT 86 (*)    Alkaline Phosphatase 147 (*)    GFR calc non Af Amer 76 (*)    GFR calc Af Amer 88 (*)    Anion gap 17 (*)    All other components within normal limits    Imaging Review Ct Head W Wo Contrast  10/24/2013   CLINICAL DATA:  Stomach cancer.  Intractable nausea and vomiting.  EXAM: CT HEAD WITHOUT AND WITH CONTRAST  TECHNIQUE: Contiguous axial images were obtained from the base of the skull through  the vertex without and with intravenous contrast  CONTRAST:  151mL OMNIPAQUE IOHEXOL 300 MG/ML  SOLN  COMPARISON:  MRI brain without and with contrast 09/19/2013.  FINDINGS: Previously noted left extra-axial hemorrhages near completely resolved. There is no evidence for new hemorrhage or significant mass effect. No acute cortical infarct or parenchymal hemorrhage, or mass lesion is present. The ventricles are of normal size.  The postcontrast images demonstrate no pathologic enhancement.  Paranasal sinuses and mastoid air cells are clear.  IMPRESSION: 1. Near complete resolution of left subdural hematoma without evidence for new hemorrhage. 2. No pathologic enhancement to suggest metastatic disease of the brain or meninges. Previously seen dural enhancement associated with the left-sided hemorrhage is beyond the resolution of this exam. 3. No acute intracranial abnormality.   Electronically Signed   By: Lawrence Santiago M.D.   On: 10/24/2013 12:58   Dg Duanne Limerick W/water Sol Cm  10/23/2013   CLINICAL DATA:  History of gastric cancer, status post gastrojejunostomy.  EXAM: WATER SOLUBLE UPPER GI SERIES  TECHNIQUE: Single-column upper GI series was performed using water soluble contrast.  CONTRAST:  89mL OMNIPAQUE IOHEXOL 300 MG/ML  SOLN  COMPARISON:   None.  FLUOROSCOPY TIME:  2 min 5 seconds  FINDINGS: On the scout radiograph a gastrostomy tube is in place. The contrast material placed within the stomach on 10/22/2013 has progressed into the colon.  The patient ingested 50 cc of water-soluble contrast material. The esophagus is patent. There is no evidence of esophageal mass or stricture. Postoperative appearance of the stomach compatible with gastrojejunostomy. The gastro jejunal anastomosis is patent.  When the patient is in the recumbent orientation several episodes of gastroesophageal reflux noted.  IMPRESSION: 1. Patent esophagus and gastrojejunal anastomosis. 2. Reflux.   Electronically Signed   By: Kerby Moors M.D.   On: 10/23/2013 13:00     EKG Interpretation None      MDM   Final diagnoses:  Generalized abdominal pain  Non-intractable vomiting with nausea, vomiting of unspecified type    Improved after medications and fluids. No sign of obstruction. No leukocytosis. Not acidotic. Has a strong desire to not be admitted to hospital and actually like to go home. She is routine followup with her primary care physician in her surgeon scheduled. Family is with her. They will be with her at home. Rest and return if any additional difficulties at home.    Tanna Furry, MD 10/24/13 [phone]

## 2013-10-24 NOTE — Progress Notes (Signed)
General Surgery [facility]  Patient seen and examined.  Up in room.  Pleasant.  States did well with TF's at 10-20 cc per hour, developed nausea at 30 cc per hour.  Gastric port clamped for some reason - now back on gravity drainage bag.  Going down now for CT scan of head.  Would re-start TF's at 10 cc and advance only to 20 cc per hour for next 24 hours and see how this is tolerated.  Earnstine Regal, MD, [facility] Office: [phone]

## 2013-10-24 NOTE — Progress Notes (Signed)
PROGRESS NOTE    Paula [name] WGN:[id number] DOB: March 10, 1965 DOA: 10/22/2013 PCP: Arnoldo Morale, MD Oncologist: Dr. Curt Bears  HPI/Brief narrative 48 year old female initially admitted 09/14/13 with one year history of nausea, vomiting and epigastric pain. EGD by Dr. 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 09/20/13, she underwent diagnostic laparoscopy, distal gastrectomy with Billroth II anastomosis and feeding GJ tube by Dr. Barry Dienes. UGI on 10/02/13 showed normal post gastrojejunostomy without perforation. Since then she has had problems with by mouth and tube feeding and has had multiple admissions for abdominal pain, nausea and vomiting. During one such admission, palliative consulted and patient wishes to continue full curative treatment. Dr. [name] saw patient on 7/25 and advised her that she has at least stage III gastric cancer and would definitely need discussion of adjuvant treatment including chemotherapy and XRT-during outpatient followup. She was readmitted with intractable nausea and vomiting.   Assessment/Plan:  1. Intractable nausea and vomiting: Unclear etiology. General surgery following. UGI results 9/2-normal. No acute abdominal signs. KUB-no acute findings. GJ tube position confirmed by imaging. Advised minimizing narcotics. Dr. Dyann Kief discussed with Dr. Penelope Coop, GI who has no further recommendations. Continue IV PPI and Reglan. CT head with contrast 9/3-no acute findings or brain metastases. She was started on tube feeds at 10 ml an hour and advanced to 20 mL per hour which she tolerated. However when this was increased to 30 mL an hour on 9/3, she became nauseous and for some reason gastrostomy port was opened and not connected to gravity drainage bag as advised by surgery. Tube feeds backed down to 10 mL an hour and gradually advance. Monitor. Difficult situation. 2. Gastric cancer,  at least stage III: Discussed with Dr. [name] on 9/2 who agrees with CT head if UGI negative and recommends outpatient followup when GI issues have settled for discussion regarding further cancer treatment. CT head negative for metastasis. 3. Chronic Anemia: no reported bleeding. Gradually dropping hemoglobin over the last 3 days to 8.5 today. Follow CBC in a.m. 4. Hypertension: Controlled. Continue carvedilol. 5. History of GERD: On IV PPI. 6. Severe malnutrition in the context of chronic illness: Await nutrition management pending GI evaluation as above.   Code Status: Full Family Communication: None at bedside Disposition Plan: Home when medically stable   Consultants:  General surgery  Procedures:  None  Antibiotics:  None   Subjective: Patient stated that she did not have any nausea and vomiting while she was on tube feeds at 20 mls an hour over last 24 hours but when increased to 30 mL per hour this morning, complained of nausea. Had 2 BMs yesterday. No reported pain.  Objective: Filed Vitals:   10/23/13 0528 10/23/13 1559 10/23/13 2002 10/24/13 0414  BP: 132/70 154/84 143/74 131/83  Pulse: 80 66 94 83  Temp: 98.3 F (36.8 C) 98.1 F (36.7 C) 98.3 F (36.8 C) 98.4 F (36.9 C)  TempSrc: Oral Oral Oral Oral  Resp: 18 17 18 16   Height:      Weight:    68.04 kg (150 lb)  SpO2: 100% 100% 100% 100%    Intake/Output Summary (Last 24 hours) at 10/24/13 1310 Last data filed at 10/24/13 0415  Gross per 24 hour  Intake  93.75 ml  Output      0 ml  Net  93.75 ml   Filed Weights   10/22/13 0525 10/24/13 0414  Weight: 67.722 kg (149 lb 4.8 oz)  68.04 kg (150 lb)     Exam:  General exam: Pleasant young female seen ambulating comfortably in the room. Respiratory system: Clear. No increased work of breathing. Cardiovascular system: S1 & S2 heard, RRR. No JVD, murmurs, gallops, clicks or pedal edema. Gastrointestinal system: Abdomen is nondistended, soft and  nontender. Normal bowel sounds heard. GJ tube site without acute findings. Central nervous system: Alert and oriented. No focal neurological deficits. Extremities: Symmetric 5 x 5 power.   Data Reviewed: Basic Metabolic Panel:  Recent Labs Lab 10/20/13 1410 10/21/13 1200 10/22/13 0153 10/22/13 0650 10/24/13 0508  NA 141 136* 136* 134* 137  K 4.3 4.3 4.0 4.3 3.9  CL 98 96 95* 96 100  CO2 26 24 24 23 25   GLUCOSE 123* 128* 106* 103* 130*  BUN 6 6 6  5* 4*  CREATININE 0.90 0.82 0.74 0.68 0.80  CALCIUM 10.4 9.5 9.6 9.2 8.8  MG  --   --   --  1.6  --   PHOS  --   --   --  3.6  --    Liver Function Tests:  Recent Labs Lab 10/20/13 1410 10/21/13 1200 10/22/13 0153 10/22/13 0650 10/24/13 0508  AST 33 43* 32 26 16  ALT 86* 82* 72* 67* 44*  ALKPHOS 147* 140* 139* 126* 97  BILITOT 0.5 0.6 0.7 0.6 0.3  PROT 8.5* 8.0 8.2 7.4 6.1  ALBUMIN 4.1 3.8 4.1 3.6 3.0*    Recent Labs Lab 10/19/13 1820 10/21/13 1200  LIPASE 50 35   No results found for this basename: AMMONIA,  in the last 168 hours CBC:  Recent Labs Lab 10/19/13 1820 10/20/13 1410 10/21/13 1200 10/22/13 0650 10/24/13 0508  WBC 4.5 5.4 4.4 5.5 4.4  NEUTROABS 2.3 3.4 3.4  --   --   HGB 10.0* 10.7* 10.3* 9.5* 8.5*  HCT 31.3* 33.5* 32.6* 31.0* 26.4*  MCV 80.5 80.5 79.5 81.4 80.0  PLT 329 353 382 307 279   Cardiac Enzymes:  Recent Labs Lab 10/21/13 1200  TROPONINI <0.30   BNP (last 3 results) No results found for this basename: PROBNP,  in the last 8760 hours CBG:  Recent Labs Lab 10/23/13 2001 10/24/13 0001 10/24/13 0411 10/24/13 0824  GLUCAP 104* 105* 136* 152*    No results found for this or any previous visit (from the past 240 hour(s)).     Studies: Ct Head W Wo Contrast  10/24/2013   CLINICAL DATA:  Stomach cancer.  Intractable nausea and vomiting.  EXAM: CT HEAD WITHOUT AND WITH CONTRAST  TECHNIQUE: Contiguous axial images were obtained from the base of the skull through the vertex  without and with intravenous contrast  CONTRAST:  133mL OMNIPAQUE IOHEXOL 300 MG/ML  SOLN  COMPARISON:  MRI brain without and with contrast 09/19/2013.  FINDINGS: Previously noted left extra-axial hemorrhages near completely resolved. There is no evidence for new hemorrhage or significant mass effect. No acute cortical infarct or parenchymal hemorrhage, or mass lesion is present. The ventricles are of normal size.  The postcontrast images demonstrate no pathologic enhancement.  Paranasal sinuses and mastoid air cells are clear.  IMPRESSION: 1. Near complete resolution of left subdural hematoma without evidence for new hemorrhage. 2. No pathologic enhancement to suggest metastatic disease of the brain or meninges. Previously seen dural enhancement associated with the left-sided hemorrhage is beyond the resolution of this exam. 3. No acute intracranial abnormality.   Electronically Signed   By: Lawrence Santiago M.D.   On: 10/24/2013 12:58  Dg Abd Portable 2v  10/22/2013   CLINICAL DATA:  Status post partial gastrectomy for gastric cancer with Billroth II and gastrojejunostomy  EXAM: PORTABLE ABDOMEN - 2 VIEW  COMPARISON:  Abdominal radiographs dated 10/21/2013. CT abdomen pelvis dated 09/13/2013.  FINDINGS: Patient is status post distal gastrectomy with gastrojejunostomy.  Initially, 25 mL contrast was administered via the J-tube. Contrast opacifies the proximal jejunum as well as the residual stomach.  Subsequently, 25 mL contrast was administered via the G-tube lumen. Additional contrast opacifies the residual stomach, confirming intragastric placement.  The distal tip of the J-tube is outlined on the second radiograph and likely resides in the proximal aspect of the jejunum just distal to the gastrojejunostomy.  IMPRESSION: Status post distal gastrectomy with gastrojejunostomy.  Intragastric placement of the gastrostomy tube is confirmed.  Jejunostomy tube likely resides in the proximal jejunum just distal to the  gastrojejunostomy.   Electronically Signed   By: Julian Hy M.D.   On: 10/22/2013 16:08   Dg Ugi W/water Sol Cm  10/23/2013   CLINICAL DATA:  History of gastric cancer, status post gastrojejunostomy.  EXAM: WATER SOLUBLE UPPER GI SERIES  TECHNIQUE: Single-column upper GI series was performed using water soluble contrast.  CONTRAST:  37mL OMNIPAQUE IOHEXOL 300 MG/ML  SOLN  COMPARISON:  None.  FLUOROSCOPY TIME:  2 min 5 seconds  FINDINGS: On the scout radiograph a gastrostomy tube is in place. The contrast material placed within the stomach on 10/22/2013 has progressed into the colon.  The patient ingested 50 cc of water-soluble contrast material. The esophagus is patent. There is no evidence of esophageal mass or stricture. Postoperative appearance of the stomach compatible with gastrojejunostomy. The gastro jejunal anastomosis is patent.  When the patient is in the recumbent orientation several episodes of gastroesophageal reflux noted.  IMPRESSION: 1. Patent esophagus and gastrojejunal anastomosis. 2. Reflux.   Electronically Signed   By: Kerby Moors M.D.   On: 10/23/2013 13:00        Scheduled Meds: . carvedilol  6.25 mg Oral BID WC  . docusate sodium  100 mg Oral BID  . enoxaparin (LOVENOX) injection  40 mg Subcutaneous Q24H  . feeding supplement (PRO-STAT SUGAR FREE 64)  30 mL Per Tube q morning - 10a  . fentaNYL  50 mcg Transdermal Q72H  . metoCLOPramide (REGLAN) injection  5 mg Intravenous 4 times per day  . pantoprazole  40 mg Oral BID AC  . scopolamine  1 patch Transdermal Q72H  . sennosides  5 mL Oral QHS  . sodium chloride  3 mL Intravenous Q12H   Continuous Infusions: . feeding supplement (OSMOLITE 1.5 CAL) 1,000 mL (10/23/13 1700)    Active Problems:   Essential hypertension   Gastric carcinoma s/p Distal gastrectomy/GJ (Billroth II) 09/20/2013   Intractable nausea and vomiting   Protein-calorie malnutrition, severe   Anemia, unspecified    Time spent: 30  minutes.    Vernell Leep, MD, FACP, FHM. [facility] Pager [phone]  If 7PM-7AM, please contact night-coverage [email or web address] Password [email or web address] 10/24/2013, 1:10 PM    LOS: 2 days

## 2013-10-25 ENCOUNTER — Ambulatory Visit: Payer: No Typology Code available for payment source | Admitting: Internal Medicine

## 2013-10-25 ENCOUNTER — Telehealth: Payer: Self-pay | Admitting: *Deleted

## 2013-10-25 ENCOUNTER — Ambulatory Visit
Admit: 2013-10-25 | Discharge: 2013-10-25 | Disposition: A | Discharge status: Home / Self Care | Payer: No Typology Code available for payment source | Attending: Radiation Oncology | Admitting: Radiation Oncology

## 2013-10-25 ENCOUNTER — Other Ambulatory Visit: Payer: No Typology Code available for payment source

## 2013-10-25 ENCOUNTER — Encounter: Payer: Self-pay | Admitting: Radiation Oncology

## 2013-10-25 ENCOUNTER — Ambulatory Visit: Admit: 2013-10-25 | Payer: No Typology Code available for payment source

## 2013-10-25 DIAGNOSIS — Z510 Encounter for antineoplastic radiation therapy: Secondary | ICD-10-CM | POA: Insufficient documentation

## 2013-10-25 DIAGNOSIS — Z934 Other artificial openings of gastrointestinal tract status: Secondary | ICD-10-CM | POA: Insufficient documentation

## 2013-10-25 DIAGNOSIS — C163 Malignant neoplasm of pyloric antrum: Secondary | ICD-10-CM | POA: Insufficient documentation

## 2013-10-25 DIAGNOSIS — Z903 Acquired absence of stomach [part of]: Secondary | ICD-10-CM | POA: Insufficient documentation

## 2013-10-25 DIAGNOSIS — C169 Malignant neoplasm of stomach, unspecified: Secondary | ICD-10-CM

## 2013-10-25 DIAGNOSIS — E43 Unspecified severe protein-calorie malnutrition: Secondary | ICD-10-CM

## 2013-10-25 DIAGNOSIS — Z980 Intestinal bypass and anastomosis status: Secondary | ICD-10-CM | POA: Insufficient documentation

## 2013-10-25 DIAGNOSIS — Z87891 Personal history of nicotine dependence: Secondary | ICD-10-CM | POA: Insufficient documentation

## 2013-10-25 LAB — GLUCOSE, CAPILLARY
GLUCOSE-CAPILLARY: 105 mg/dL — AB (ref 70–99)
GLUCOSE-CAPILLARY: 91 mg/dL (ref 70–99)
GLUCOSE-CAPILLARY: 95 mg/dL (ref 70–99)
Glucose-Capillary: 83 mg/dL (ref 70–99)
Glucose-Capillary: 93 mg/dL (ref 70–99)
Glucose-Capillary: 82 mg/dL (ref 70–99)

## 2013-10-25 LAB — CBC
HEMATOCRIT: 29.9 % — AB (ref 36.0–46.0)
Hemoglobin: 9.3 g/dL — ABNORMAL LOW (ref 12.0–15.0)
MCH: 25.1 pg — AB (ref 26.0–34.0)
MCHC: 31.1 g/dL (ref 30.0–36.0)
MCV: 80.8 fL (ref 78.0–100.0)
Platelets: 307 10*3/uL (ref 150–400)
RBC: 3.7 MIL/uL — ABNORMAL LOW (ref 3.87–5.11)
RDW: 14.8 % (ref 11.5–15.5)
WBC: 5.2 10*3/uL (ref 4.0–10.5)

## 2013-10-25 MED ORDER — LORAZEPAM 1 MG PO TABS
1.0000 mg | ORAL_TABLET | Freq: Three times a day (TID) | ORAL | Status: DC
  Administered 2013-10-25 – 2013-10-26 (×5): 1 mg via ORAL
  Filled 2013-10-25 (×5): qty 1

## 2013-10-25 MED ORDER — SODIUM CHLORIDE 0.9 % IV SOLN
INTRAVENOUS | Status: DC
  Administered 2013-10-26: 01:00:00 via INTRAVENOUS

## 2013-10-25 NOTE — Progress Notes (Signed)
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 female who presented with progressive nausea, abdominal pain, bilateral chest wall pain, and eventual non-bloody, bilious intractable vomiting. Initially, for one year these symptoms were waxing and waning, but because more severe and persistent with time. She was ultimately workup with with the following tests:  A CT angio performed prior to admission on 7/24 was negative for pulmonary emboli, but possible R LL pneumonia. A CT of the abdomen and pelvis with contrast on 7/25 was negative for acute abnormalities as well. Abdominal ultrasound was done which showed sludge in the gallbladder.  PET is still pending.  As symptoms were not resolved, GI evaluation was obtained on 7/26 (Dr. Penelope Coop) EGD was performed on 7/27 to evaluate for ulcers. Biopsies were obtained, with pathology report consistent with poorly differentiated adenocarcinoma with signet ring cell features.  She had been consuming 1 bottle of wine a day before diagnosis. Former smoker.  Underwent Diagnostic laparoscopy, distal gastrectomy with billroth II anastamosis and feeding gastrojejunostomy tube placement on 7-31 by Dr Barry Dienes. 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 4/11 nodes positive.  She reports 30lb weight loss since surgery although vitals imply weight is at or close to baseline. She was readmitted in the past day for recurrent nausea/vomiting. Denied  hematemesis on admission.  PREVIOUS RADIATION THERAPY: No  PAST MEDICAL HISTORY:  has a past medical history of Headache(784.0) (2011); Esophageal reflux; Costochondritis; Hypertension; Ovarian cyst; and Stomach cancer.    PAST SURGICAL HISTORY: Past Surgical History  Procedure Laterality Date  . Oophorectomy      Around 1985 left ovary removal  . Tonsillectomy      Around 1994  . Tubal ligation    . Esophagogastroduodenoscopy N/A 09/16/2013    Procedure: ESOPHAGOGASTRODUODENOSCOPY (EGD);  Surgeon: Wonda Horner, MD;  Location: [facility];  Service: Endoscopy;  Laterality: N/A;  . Laparoscopy N/A 09/20/2013    Procedure: DIAGNOSTIC LAPAROSCOPY,DISTAL GASTRECTOMY AND FEEDING GASTROJEJUNOSTOMY;  Surgeon: [name], MD;  Location: [facility];  Service: General;  Laterality: N/A;    FAMILY HISTORY: family history includes Diabetes Mellitus II in her mother; Heart failure in her maternal grandmother; Hypertension in her brother, maternal grandfather, mother, and sister.  SOCIAL HISTORY:  reports that she has quit smoking. She has never used smokeless tobacco. She reports that she drinks alcohol. She reports that she does not use illicit drugs.  ALLERGIES: Review of patient's allergies indicates no known allergies.  MEDICATIONS:  No current facility-administered medications for this encounter.   No current outpatient prescriptions on file.   Facility-Administered Medications Ordered in Other Encounters  Medication Dose Route Frequency Provider Last Rate Last Dose  . acetaminophen (TYLENOL) tablet 650 mg  650 mg Oral Q6H PRN Toy Baker, MD       Or  . acetaminophen (TYLENOL) suppository 650 mg  650 mg Rectal Q6H PRN Toy Baker, MD      . carvedilol (COREG) tablet 6.25 mg  6.25 mg Oral BID WC Toy Baker, MD   6.25  mg at 10/24/13 0810  . docusate sodium (COLACE) capsule 100 mg  100 mg Oral BID Toy Baker, MD   100 mg at 10/24/13 0954  . enoxaparin (LOVENOX)  injection 40 mg  40 mg Subcutaneous Q24H Toy Baker, MD   40 mg at 10/24/13 0952  . feeding supplement (OSMOLITE 1.5 CAL) liquid 1,000 mL  1,000 mL Per Tube Continuous Hazle Coca, RD 10 mL/hr at 10/23/13 1700 1,000 mL at 10/23/13 1700  . feeding supplement (PRO-STAT SUGAR FREE 64) liquid 30 mL  30 mL Per Tube TID PC Hazle Coca, RD      . fentaNYL ([place] - dosed mcg/hr) patch 50 mcg  50 mcg Transdermal Q72H Toy Baker, MD   50 mcg at 10/25/13 0541  . HYDROcodone-acetaminophen (NORCO/VICODIN) 5-325 MG per tablet 1 tablet  1 tablet Oral Q6H PRN Modena Jansky, MD      . HYDROmorphone (DILAUDID) injection 0.5 mg  0.5 mg Intravenous Q4H PRN Modena Jansky, MD   0.5 mg at 10/25/13 0542  . metoCLOPramide (REGLAN) injection 5 mg  5 mg Intravenous 4 times per day Toy Baker, MD   5 mg at 10/25/13 0543  . ondansetron (ZOFRAN) tablet 4 mg  4 mg Oral Q6H PRN Toy Baker, MD       Or  . ondansetron (ZOFRAN) injection 4 mg  4 mg Intravenous Q6H PRN Toy Baker, MD   4 mg at 10/24/13 2036  . pantoprazole (PROTONIX) EC tablet 40 mg  40 mg Oral BID AC Modena Jansky, MD   40 mg at 10/24/13 0951  . promethazine (PHENERGAN) injection 12.5-25 mg  12.5-25 mg Intravenous Q6H PRN Modena Jansky, MD   25 mg at 10/25/13 0113  . protein supplement (UNJURY CHICKEN SOUP) powder 1 packet  1 packet Oral q morning - 10a Hazle Coca, RD      . scopolamine (TRANSDERM-SCOP) 1 MG/3DAYS 1.5 mg  1 patch Transdermal Q72H Toy Baker, MD   1.5 mg at 10/25/13 0541  . sennosides (SENOKOT) 8.8 MG/5ML syrup 5 mL  5 mL Oral QHS Anastassia Doutova, MD      . sodium chloride 0.9 % injection 3 mL  3 mL Intravenous Q12H Toy Baker, MD      . zolpidem (AMBIEN) tablet 5 mg  5 mg Oral QHS PRN Toy Baker, MD        REVIEW OF SYSTEMS:  Notable for that above.   PHYSICAL EXAM:   Vitals - 1 value per visit 04/29/298  SYSTOLIC 923  DIASTOLIC 89    Pulse 300  Temperature 98.4  Respirations 15  Weight (lb) 149.4  Height   BMI   VISIT REPORT    General: Alert and oriented, mild distress HEENT: Head is normocephalic. Oropharynx is dry. Neck: Neck is supple, no palpable cervical or supraclavicular lymphadenopathy. Heart: Regular in rate and rhythm with no murmurs, rubs, or gallops. Chest: Clear to auscultation bilaterally, with no rhonchi, wheezes, or rales. Abdomen: Soft, nontender, nondistended, with no rigidity or guarding. Normoactive bowel sounds. Incision healed.  Feeding tube ostomy is clean. Extremities: No cyanosis or edema. Lymphatics: No concerning lymphadenopathy. Skin: No concerning lesions. Musculoskeletal: symmetric strength and muscle tone throughout. Neurologic: Cranial nerves II through XII are grossly intact. No obvious focalities. Speech is fluent.  Psychiatric: Judgment and insight are intact. Affect is blunted.  ECOG = 3  0 - Asymptomatic (Fully active, able to carry on all predisease activities without restriction)  1 -  Symptomatic but completely ambulatory (Restricted in physically strenuous activity but ambulatory and able to carry out work of a light or sedentary nature. For example, light housework, office work)  2 - Symptomatic, <50% in bed during the day (Ambulatory and capable of all self care but unable to carry out any work activities. Up and about more than 50% of waking hours)  3 - Symptomatic, >50% in bed, but not bedbound (Capable of only limited self-care, confined to bed or chair 50% or more of waking hours)  4 - Bedbound (Completely disabled. Cannot carry on any self-care. Totally confined to bed or chair)  5 - Death   Eustace Pen MM, Creech RH, Tormey DC, et al. [phone]). "Toxicity and response criteria of the [facility]". [place] Oncol. 5 (6): 649-55   LABORATORY DATA:  Lab Results  Component Value Date   WBC 5.2 10/25/2013   HGB 9.3* 10/25/2013   HCT 29.9*  10/25/2013   MCV 80.8 10/25/2013   PLT 307 10/25/2013   CMP     Component Value Date/Time   NA 137 10/24/2013 0508   K 3.9 10/24/2013 0508   CL 100 10/24/2013 0508   CO2 25 10/24/2013 0508   GLUCOSE 130* 10/24/2013 0508   BUN 4* 10/24/2013 0508   CREATININE 0.80 10/24/2013 0508   CALCIUM 8.8 10/24/2013 0508   PROT 6.1 10/24/2013 0508   ALBUMIN 3.0* 10/24/2013 0508   AST 16 10/24/2013 0508   ALT 44* 10/24/2013 0508   ALKPHOS 97 10/24/2013 0508   BILITOT 0.3 10/24/2013 0508   GFRNONAA 87* 10/24/2013 0508   GFRAA >90 10/24/2013 0508         RADIOGRAPHY: Dg Abd 1 View  10/11/2013   CLINICAL DATA:  Intolerance of jejunostomy tube feedings.  EXAM: ABDOMEN - 1 VIEW  COMPARISON:  Abdomen series 8/19 2015.  FINDINGS: Surgical sutures left upper quadrant. Small amount of residual contrast in the colon from prior studies. Jejunostomy tube was injected with nonionic contrast. Jejunostomy tube is widely patent. Proximal small bowel is nondilated.  IMPRESSION: 1. Patent jejunostomy tube.  Proximal small bowel nondilated. 2. No acute abnormality.   Electronically Signed   By: Marcello Moores  Register   On: 10/11/2013 12:11   Dg Abd 1 View  10/09/2013   CLINICAL DATA:  Fecal impaction.  EXAM: ABDOMEN - 1 VIEW  COMPARISON:  September 26, 2013.  FINDINGS: Residual contrast is noted throughout the colon. There is no evidence of bowel obstruction or ileus. Gastrostomy tube is noted in the left upper quadrant. Phleboliths are noted in the pelvis.  IMPRESSION: No evidence of bowel obstruction or ileus. Residual contrast is noted throughout the colon.   Electronically Signed   By: Sabino Dick M.D.   On: 10/09/2013 15:24   Dg Abd 1 View  09/26/2013   CLINICAL DATA:  Postop ileus.  EXAM: ABDOMEN - 1 VIEW  COMPARISON:  No prior.  FINDINGS: Soft tissue structures are unremarkable. Surgical tubing noted over the right abdomen. Surgical staples and clips noted in the abdomen. Gas pattern is nonspecific. No free air. No acute bony abnormality. Lung bases are  clear.  IMPRESSION: Postsurgical changes.  No evidence of bowel distention.   Electronically Signed   By: [place]   On: 09/26/2013 20:28   Ct Head W Wo Contrast  10/24/2013   CLINICAL DATA:  Stomach cancer.  Intractable nausea and vomiting.  EXAM: CT HEAD WITHOUT AND WITH CONTRAST  TECHNIQUE: Contiguous axial images were  obtained from the base of the skull through the vertex without and with intravenous contrast  CONTRAST:  12m OMNIPAQUE IOHEXOL 300 MG/ML  SOLN  COMPARISON:  MRI brain without and with contrast 09/19/2013.  FINDINGS: Previously noted left extra-axial hemorrhages near completely resolved. There is no evidence for new hemorrhage or significant mass effect. No acute cortical infarct or parenchymal hemorrhage, or mass lesion is present. The ventricles are of normal size.  The postcontrast images demonstrate no pathologic enhancement.  Paranasal sinuses and mastoid air cells are clear.  IMPRESSION: 1. Near complete resolution of left subdural hematoma without evidence for new hemorrhage. 2. No pathologic enhancement to suggest metastatic disease of the brain or meninges. Previously seen dural enhancement associated with the left-sided hemorrhage is beyond the resolution of this exam. 3. No acute intracranial abnormality.   Electronically Signed   By: CLawrence SantiagoM.D.   On: 10/24/2013 12:58   Mr Lumbar Spine W Wo Contrast  10/11/2013   CLINICAL DATA:  Gastric carcinoma.  Abnormal bone scan at L1.  EXAM: MRI LUMBAR SPINE WITHOUT AND WITH CONTRAST  TECHNIQUE: Multiplanar and multiecho pulse sequences of the lumbar spine were obtained without and with intravenous contrast.  CONTRAST:  15 mL MultiHance IV  COMPARISON:  Bone scan 10/11/2013  FINDINGS: Transitional anatomy.  L5 is partially incorporated into the sacrum.  Negative for fracture or mass lesion. Vertebral body signal is normal with particular attention L1. No evidence of bone marrow edema or mass. No enhancing lesions are seen  postcontrast infusion. Etiology of the uptake on the bone scan is unclear.  Negative for fracture. Conus medullaris is normal. No significant degenerative change in the lumbar spine. No disc protrusion or spinal stenosis.  IMPRESSION: Negative for fracture or metastatic disease. Bone marrow signal is normal. No explanation for the bone scan finding at L1.   Electronically Signed   By: CFranchot GalloM.D.   On: 10/11/2013 19:56   Nm Bone Scan Whole Body  10/11/2013   CLINICAL DATA:  Back pain.  Signet cell gastric carcinoma.  EXAM: NUCLEAR MEDICINE WHOLE BODY BONE SCAN  TECHNIQUE: Whole body anterior and posterior images were obtained approximately 3 hours after intravenous injection of radiopharmaceutical.  RADIOPHARMACEUTICALS:  24.5 mCi Technetium-99 MDP  COMPARISON:  CT chest, abdomen, and pelvis 09/13/2013  FINDINGS: There is a small focus of increased radiotracer uptake in the spine slightly left of midline at approximately L1, best seen on the anterior images. Uptake throughout the remainder of the axial skeleton is symmetric. Uptake involving the left great toe is likely degenerative. Normal uptake is seen in the kidneys with excretion in the bladder.  IMPRESSION: Focal uptake in the spine at approximately L1, with considerations including degenerative change as well as metastasis. Further evaluation with MRI may be helpful.   Electronically Signed   By: ALogan Bores  On: 10/11/2013 11:42   Dg Chest Port 1 View  09/30/2013   CLINICAL DATA:  PICC placement.  EXAM: PORTABLE CHEST - 1 VIEW  COMPARISON:  Chest radiograph performed 09/12/2013, and CTA of the chest performed 09/13/2013  FINDINGS: The lungs are well-aerated. Minimal right basilar atelectasis is noted. There is no evidence of pleural effusion or pneumothorax.  The cardiomediastinal silhouette is borderline enlarged. No acute osseous abnormalities are seen. A right PICC is noted ending about the distal SVC.  IMPRESSION: 1. Right PICC noted  ending about the distal SVC. 2. Borderline cardiomegaly; minimal right basilar atelectasis noted.   Electronically Signed  By: Garald Balding M.D.   On: 09/30/2013 06:09   Dg Abd Acute W/chest  10/21/2013   CLINICAL DATA:  Gastric carcinoma. Abdominal pain. Vomiting. Cough.  EXAM: ACUTE ABDOMEN SERIES (ABDOMEN 2 VIEW & CHEST 1 VIEW)  COMPARISON:  None.  FINDINGS: Percutaneous gastrostomy tube again seen in left upper quadrant. Surgical clips and staples noted. No evidence of dilated bowel loops or free air.  Mild elevation of right hemidiaphragm and right basilar pleural- parenchymal scarring appears stable. No evidence of acute infiltrate or edema. No evidence of pleural effusion. Heart size is within normal limits. No mass or lymphadenopathy identified.  IMPRESSION: Unremarkable bowel gas pattern.  No acute findings.  Stable right basilar pleural- parenchymal scarring. No active lung disease.   Electronically Signed   By: Earle Gell M.D.   On: 10/21/2013 14:22   Dg Abd Acute W/chest  10/20/2013   CLINICAL DATA:  Worsening abdominal pain after recent discharge. Recent diagnosis of gastric cancer.  EXAM: ACUTE ABDOMEN SERIES (ABDOMEN 2 VIEW & CHEST 1 VIEW)  COMPARISON:  Acute abdominal series 10/19/2013  FINDINGS: Similar appearance of right basilar scarring. Otherwise, the lungs are clear. Stable cardiac and mediastinal contours. No free air. Percutaneous gastrostomy tube appears in similar position and projects over the stomach. Staple lines are noted in the left upper quadrant suggesting partial gastric resection. No evidence of ascites or bowel obstruction. No acute osseous abnormality.  IMPRESSION: 1. No acute cardiopulmonary process. 2. Stable right basilar atelectasis versus scarring. 3. Stable position of gastrostomy tube. 4. Nonobstructed bowel gas pattern. 5. No evidence of free air or ascites.   Electronically Signed   By: Jacqulynn Cadet M.D.   On: 10/20/2013 14:54   Dg Abd Acute  W/chest  10/19/2013   CLINICAL DATA:  48 year old female with abdominal pain and nausea. Recent diagnosis of gastric cancer and surgery.  EXAM: ACUTE ABDOMEN SERIES (ABDOMEN 2 VIEW & CHEST 1 VIEW)  COMPARISON:  10/13/2013 and prior radiographs  FINDINGS: The cardiomediastinal silhouette is unremarkable.  Mild right basilar atelectasis/scarring again identified.  No airspace disease, pleural effusion or pneumothorax.  Surgical sutures overlying the left abdomen noted.  Jejunostomy catheter is again noted.  The bowel gas pattern is unremarkable without evidence of bowel obstruction or pneumoperitoneum.  No suspicious calcifications are identified.  The bony structures are unremarkable.  IMPRESSION: No evidence of acute abnormality.  Normal bowel gas pattern.   Electronically Signed   By: Hassan Rowan M.D.   On: 10/19/2013 19:26   Dg Abd Acute W/chest  10/13/2013   CLINICAL DATA:  Abdominal pain and vomiting. Discharged today from a 30 day hospital stay.  EXAM: ACUTE ABDOMEN SERIES (ABDOMEN 2 VIEW & CHEST 1 VIEW)  COMPARISON:  10/11/2013  FINDINGS: Right PICC catheter with tip over the cavoatrial junction. Slightly shallow inspiration. Fibrosis or atelectasis in the right lung base. No focal consolidation. No blunting of costophrenic angles. Normal heart size and pulmonary vascularity.  Postoperative changes in the left upper quadrant with gastrostomy tube present. Residual contrast material in the colon without distention. No small bowel distention. No free intra-abdominal air. No abnormal air-fluid levels. No radiopaque stones. Visualized bones appear intact.  IMPRESSION: Fibrosis or atelectasis in the right lung base. Nonobstructive bowel gas pattern. No free air. Residual contrast material in the colon.   Electronically Signed   By: Lucienne Capers M.D.   On: 10/13/2013 21:15   Dg Abd Portable 2v  10/22/2013   CLINICAL DATA:  Status post  partial gastrectomy for gastric cancer with Billroth II and  gastrojejunostomy  EXAM: PORTABLE ABDOMEN - 2 VIEW  COMPARISON:  Abdominal radiographs dated 10/21/2013. CT abdomen pelvis dated 09/13/2013.  FINDINGS: Patient is status post distal gastrectomy with gastrojejunostomy.  Initially, 25 mL contrast was administered via the J-tube. Contrast opacifies the proximal jejunum as well as the residual stomach.  Subsequently, 25 mL contrast was administered via the G-tube lumen. Additional contrast opacifies the residual stomach, confirming intragastric placement.  The distal tip of the J-tube is outlined on the second radiograph and likely resides in the proximal aspect of the jejunum just distal to the gastrojejunostomy.  IMPRESSION: Status post distal gastrectomy with gastrojejunostomy.  Intragastric placement of the gastrostomy tube is confirmed.  Jejunostomy tube likely resides in the proximal jejunum just distal to the gastrojejunostomy.   Electronically Signed   By: Julian Hy M.D.   On: 10/22/2013 16:08   Dg Ugi W/water Sol Cm  10/23/2013   CLINICAL DATA:  History of gastric cancer, status post gastrojejunostomy.  EXAM: WATER SOLUBLE UPPER GI SERIES  TECHNIQUE: Single-column upper GI series was performed using water soluble contrast.  CONTRAST:  48m OMNIPAQUE IOHEXOL 300 MG/ML  SOLN  COMPARISON:  None.  FLUOROSCOPY TIME:  2 min 5 seconds  FINDINGS: On the scout radiograph a gastrostomy tube is in place. The contrast material placed within the stomach on 10/22/2013 has progressed into the colon.  The patient ingested 50 cc of water-soluble contrast material. The esophagus is patent. There is no evidence of esophageal mass or stricture. Postoperative appearance of the stomach compatible with gastrojejunostomy. The gastro jejunal anastomosis is patent.  When the patient is in the recumbent orientation several episodes of gastroesophageal reflux noted.  IMPRESSION: 1. Patent esophagus and gastrojejunal anastomosis. 2. Reflux.   Electronically Signed   By: TKerby MoorsM.D.   On: 10/23/2013 13:00   Dg Ugi W/water Sol Cm  10/02/2013   CLINICAL DATA:  Status post Billroth 2 for gastric carcinoma 2 weeks ago. Persistent nausea and vomiting.  EXAM: WATER SOLUBLE UPPER GI SERIES  TECHNIQUE: Single-column upper GI series was performed using water soluble contrast. Preprocedure scout film was also performed.  CONTRAST:  1561mOMNIPAQUE IOHEXOL 300 MG/ML  SOLN  COMPARISON:  09/26/2013 acute abdomen series.  CT of 09/13/2013.  FLUOROSCOPY TIME:  2 min and 45 seconds  FINDINGS: Preprocedure scout film demonstrates a left-sided gastrojejunostomy tube. No free intraperitoneal air. Non-obstructive bowel gas pattern.  Focused single-contrast exam, with the patient nearly upright (unable to be positioned supine secondary to discomfort). Normal postoperative appearance of the residual stomach. Contrast traverses the gastrojejunostomy site, without extravasation or obstruction. Normal caliber of proximal small bowel loops.  IMPRESSION: Expected appearance after gastrojejunostomy, without perforation or bowel obstruction.   Electronically Signed   By: KyAbigail Miyamoto.D.   On: 10/02/2013 11:46   PATH: 09-20-13 surgery Diagnosis 1. 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 - TUMOR INVADES LAMINA PROPRIA. - RESECTION MARGINS NEGATIVE FOR TUMOR. - METASTATIC CARCINOMA IN THREE OF FIVE LYMPH NODES (3/5). - CHRONIC ACTIVE GASTRITIS WITH HELICOBACTER PYLORI. - FRAGMENT OF BENIGN PANCREATIC TISSUE. - SEE ONCOLOGY TABLE. 2. Lymph node, biopsy, peri-gastric - METASTATIC CARCINOMA IN ONE OF SIX LYMPH NODES (1/6). - LIPOGRANULOMATOUS LYMPHADENOPATHY. - SEE COMMENT. 3. Omentum, resection for tumor - FAT NECROSIS. - NO MALIGNANCY IDENTIFIED. - SEE COMMENT. Microscopic Comment 1. STOMACH: Specimen: Stomach. Procedure: Partial gastrectomy. 1 of 4 FINAL for  NIGEL, WESSMAN A [phone]) Microscopic  Comment(continued) Tumor Site: Gastric antrum and body. Tumor Size: Unknown. Due to the diffuse nature of the tumor and lack of gross abnormality, the exact extent of the tumor is unknown. An estimate of tumor can be provide by the number of involved slides, which is 5. Histologic Type: Adenocarcinoma with signet ring cells (diffuse type). Histologic Grade: G3, poorly differentiated. GX: Cannot be assessed G1: Well differentiated G2: Moderately differentiated G3: Poorly differentiated G4: Undifferentiated Microscopic Extent of Tumor: Tumor invades lamina propria. There are focal areas suspicious for superficial muscularis mucosa invasion, but there is no submucosal invasion, therefore stage would remain unchanged. Margins (select all that apply) All margins uninvolved by carcinoma: Distance of carcinoma from closest margin: Exact distance unable to be determined, due to lack of gross lesion. Specify margin: N/A. Treatment Effect: N/A. Lymph-Vascular Invasion: Not identified. Perineural Invasion: Not identified. Additional findings: Chronic active gastritis with Helicobacter pylori (Warthin-Starry stain is positive for H. pylori). Ancillary testing: Her2 was performed on the prior biopsy ([id number]) and was not amplified. Lymph nodes: number examined 11; number positive: 4 Pathologic Staging: pT1a, pN2, pMX Comments: Cytokeratin staining is performed on several of the primary lymph nodes and reveals an additional focus of tumor cells in one lymph node. Cytokeratin is performed on part of the proximal margin, with no tumor identified.     ---------------------- 09-16-13 BIOPSY CHROMOGENIC IN-SITU HYBRIDIZATION Results: HER-2/NEU BY CISH - NO AMPLIFICATION OF HER-2 DETECTED. RESULT RATIO OF HER2: CEP 17 SIGNALS 1.06 AVERAGE HER2 COPY NUMBER PER CELL 1.75 REFERENCE RANGE NEGATIVE HER2/Chr17 Ratio <2.0 and Average HER2 copy number <4.0 EQUIVOCAL HER2/Chr17 Ratio <2.0 and  Average HER2 copy number 4.0 and <6.0 POSITIVE HER2/Chr17 Ratio >=2.0 and/or Average HER2 copy number >=6.0 Enid Cutter MD Pathologist, Electronic Signature ( Signed 09/20/2013) FINAL DIAGNOSIS Diagnosis Stomach, biopsy, antral ulcer - POORLY DIFFERENTIATED ADENOCARCINOMA WITH SIGNET RING CELL FEATURES (DIFFUSE TYPE). Microscopic Comment Immunostains were performed and the tumor cells are strongly positive for CK7, CK20 and CDX 2, negative for ER with appropriate control. Her 2 neu study will be performed and an addendum report will follow. The case was discussed with Dr. Penelope Coop on 09/17/2013. 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 She is status post resection  approximately 5 weeks ago. The standard adjuvant therapy would be chemotherapy and about 5 weeks of radiation. Right now, her performance status is limited related to nausea and vomiting. I will allow her and the inpatient team to focus on restoring her nausea control, nutrition, and improvement of her functional status. I will schedule a followup in my Department in approximately 2 weeks to reassess whether she is ready for radiation planning. We discussed the risks benefits and side effects of radiotherapy for stomach cancer. She understands that radiation is targeted in the abdomen with the goal of local regional control. We discussed side effects which include skin irritation and fatigue as well as diarrhea and possible nausea. We discussed rare internal organ injury. I will review a consent form with her as she follows up with me in an outpatient setting. Of note, she still needs a PET scan and it appears as ordered by medical oncology. We'll need this to rule out distant metastatic disease before any treatment is started.   She appears distressed and somewhat discouraged. Emotional support  and empathy given today. I will let  the social workers in the cancer center know about this patient.   __________________________________________   Eppie Gibson, MD

## 2013-10-25 NOTE — Progress Notes (Signed)
General Surgery [facility]  Patient discussed this AM with Dr. Lindaann Pascal and Dr. Earlie Server.  Little to add from surgical standpoint at this time.  Will be available if surgical issues arise.  Call if needed.  Earnstine Regal, MD, [facility] Office: [phone]

## 2013-10-25 NOTE — Progress Notes (Signed)
PROGRESS NOTE    Paula [name] WEX:[id number] DOB: 1965/09/07 DOA: 10/22/2013 PCP: Arnoldo Morale, MD Oncologist: Dr. Curt Bears  HPI/Brief narrative 48 year old female initially admitted 09/14/13 with one year history of nausea, vomiting and epigastric pain. EGD by Dr. 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 09/20/13, she underwent diagnostic laparoscopy, distal gastrectomy with Billroth II anastomosis and feeding GJ tube by Dr. Barry Dienes. UGI on 10/02/13 showed normal post gastrojejunostomy without perforation. Since then she has had problems with by mouth and tube feeding and has had multiple admissions for abdominal pain, nausea and vomiting. During one such admission, palliative consulted and patient wishes to continue full curative treatment. Dr. [name] saw patient on 7/25 and advised her that she has at least stage III gastric cancer and would definitely need discussion of adjuvant treatment including chemotherapy and XRT-during outpatient followup. She was readmitted with intractable nausea and vomiting.   Assessment/Plan:  1. Intractable nausea and vomiting: Unclear etiology. UGI results 9/2-normal. No acute abdominal signs. KUB-no acute findings. GJ tube position confirmed by imaging. Advised minimizing narcotics. Continue IV PPI and Reglan. CT head with contrast 9/3-no acute findings or brain metastases. She was started on tube feeds at 10 ml an hour and advanced to 20 mL per hour which she tolerated but started having nausea and vomiting when it was increased to 30 mL an hour. Resume tube feeding at 10 mL per hour and advance to 20 mL per hour-request dietitian to increase calorie concentration. Discussed in detail with Dr. Harlow Asa (CCS) and Dr. [name] (oncology). No mechanical causes for her vomiting.? Psychogenic etiology. Recommendation of Ativan 1 mg 3 times a day trial and if that doesn't work  then consider Zyprexa. If patient unable to tolerate adequate tube/by mouth feeding to achieve adequate nutritional state, we'll need to go back on TNA. GI input appreciated the 2. Gastric cancer, at least stage III: Discussed with Dr. [name] on 9/2 who recommends outpatient followup when GI issues have settled for discussion regarding further cancer treatment. CT head negative for metastasis. Radiation oncology input appreciated. 3. Chronic Anemia: no reported bleeding. Hb stable. 4. Hypertension: Controlled. Continue carvedilol. 5. History of GERD: On IV PPI. 6. Severe malnutrition in the context of chronic illness: Await nutrition management pending GI evaluation as above.   Code Status: Full Family Communication: None at bedside Disposition Plan: Home when medically stable   Consultants:  General surgery  Gastroenterology  Radiation oncology  Procedures:  None  Antibiotics:  None   Subjective: Several episodes of nausea and vomiting yesterday but none since last night.  Objective: Filed Vitals:   10/24/13 2016 10/25/13 0409 10/25/13 0705 10/25/13 1324  BP: 165/91 152/89  126/75  Pulse: 111 103  78  Temp: 99.7 F (37.6 C) 98.4 F (36.9 C)  98.7 F (37.1 C)  TempSrc: Oral Oral  Oral  Resp: 16 15  16   Height:      Weight:   67.767 kg (149 lb 6.4 oz)   SpO2: 100% 100%  100%    Intake/Output Summary (Last 24 hours) at 10/25/13 1550 Last data filed at 10/25/13 0600  Gross per 24 hour  Intake  792.5 ml  Output      0 ml  Net  792.5 ml   Filed Weights   10/22/13 0525 10/24/13 0414 10/25/13 0705  Weight: 67.722 kg (149 lb 4.8 oz) 68.04 kg (150 lb) 67.767 kg (149 lb 6.4 oz)  Exam:  General exam: Pleasant young female lying comfortably in bed. Respiratory system: Clear. No increased work of breathing. Cardiovascular system: S1 & S2 heard, RRR. No JVD, murmurs, gallops, clicks or pedal edema. Gastrointestinal system: Abdomen is nondistended, soft and  nontender. Normal bowel sounds heard. GJ tube site without acute findings. Gastrostomy tube connected to drainage bag-one third full. Central nervous system: Alert and oriented. No focal neurological deficits. Extremities: Symmetric 5 x 5 power.   Data Reviewed: Basic Metabolic Panel:  Recent Labs Lab 10/20/13 1410 10/21/13 1200 10/22/13 0153 10/22/13 0650 10/24/13 0508  NA 141 136* 136* 134* 137  K 4.3 4.3 4.0 4.3 3.9  CL 98 96 95* 96 100  CO2 26 24 24 23 25   GLUCOSE 123* 128* 106* 103* 130*  BUN 6 6 6  5* 4*  CREATININE 0.90 0.82 0.74 0.68 0.80  CALCIUM 10.4 9.5 9.6 9.2 8.8  MG  --   --   --  1.6  --   PHOS  --   --   --  3.6  --    Liver Function Tests:  Recent Labs Lab 10/20/13 1410 10/21/13 1200 10/22/13 0153 10/22/13 0650 10/24/13 0508  AST 33 43* 32 26 16  ALT 86* 82* 72* 67* 44*  ALKPHOS 147* 140* 139* 126* 97  BILITOT 0.5 0.6 0.7 0.6 0.3  PROT 8.5* 8.0 8.2 7.4 6.1  ALBUMIN 4.1 3.8 4.1 3.6 3.0*    Recent Labs Lab 10/19/13 1820 10/21/13 1200  LIPASE 50 35   No results found for this basename: AMMONIA,  in the last 168 hours CBC:  Recent Labs Lab 10/19/13 1820 10/20/13 1410 10/21/13 1200 10/22/13 0650 10/24/13 0508 10/25/13 0523  WBC 4.5 5.4 4.4 5.5 4.4 5.2  NEUTROABS 2.3 3.4 3.4  --   --   --   HGB 10.0* 10.7* 10.3* 9.5* 8.5* 9.3*  HCT 31.3* 33.5* 32.6* 31.0* 26.4* 29.9*  MCV 80.5 80.5 79.5 81.4 80.0 80.8  PLT 329 353 382 307 279 307   Cardiac Enzymes:  Recent Labs Lab 10/21/13 1200  TROPONINI <0.30   BNP (last 3 results) No results found for this basename: PROBNP,  in the last 8760 hours CBG:  Recent Labs Lab 10/24/13 2014 10/24/13 2354 10/25/13 0405 10/25/13 0732 10/25/13 1158  GLUCAP 105* 92 83 91 105*    No results found for this or any previous visit (from the past 240 hour(s)).     Studies: Ct Head W Wo Contrast  10/24/2013   CLINICAL DATA:  Stomach cancer.  Intractable nausea and vomiting.  EXAM: CT HEAD WITHOUT  AND WITH CONTRAST  TECHNIQUE: Contiguous axial images were obtained from the base of the skull through the vertex without and with intravenous contrast  CONTRAST:  135mL OMNIPAQUE IOHEXOL 300 MG/ML  SOLN  COMPARISON:  MRI brain without and with contrast 09/19/2013.  FINDINGS: Previously noted left extra-axial hemorrhages near completely resolved. There is no evidence for new hemorrhage or significant mass effect. No acute cortical infarct or parenchymal hemorrhage, or mass lesion is present. The ventricles are of normal size.  The postcontrast images demonstrate no pathologic enhancement.  Paranasal sinuses and mastoid air cells are clear.  IMPRESSION: 1. Near complete resolution of left subdural hematoma without evidence for new hemorrhage. 2. No pathologic enhancement to suggest metastatic disease of the brain or meninges. Previously seen dural enhancement associated with the left-sided hemorrhage is beyond the resolution of this exam. 3. No acute intracranial abnormality.   Electronically Signed  By: Lawrence Santiago M.D.   On: 10/24/2013 12:58        Scheduled Meds: . carvedilol  6.25 mg Oral BID WC  . docusate sodium  100 mg Oral BID  . enoxaparin (LOVENOX) injection  40 mg Subcutaneous Q24H  . feeding supplement (PRO-STAT SUGAR FREE 64)  30 mL Per Tube TID PC  . fentaNYL  50 mcg Transdermal Q72H  . LORazepam  1 mg Oral TID  . metoCLOPramide (REGLAN) injection  5 mg Intravenous 4 times per day  . pantoprazole  40 mg Oral BID AC  . protein supplement  1 packet Oral q morning - 10a  . scopolamine  1 patch Transdermal Q72H  . sennosides  5 mL Oral QHS  . sodium chloride  3 mL Intravenous Q12H   Continuous Infusions: . sodium chloride 50 mL/hr at 10/25/13 1314  . feeding supplement (OSMOLITE 1.5 CAL) 1,000 mL (10/23/13 1700)    Active Problems:   Essential hypertension   Gastric carcinoma s/p Distal gastrectomy/GJ (Billroth II) 09/20/2013   Intractable nausea and vomiting    Protein-calorie malnutrition, severe   Anemia, unspecified    Time spent: 30 minutes.    Vernell Leep, MD, FACP, FHM. [facility] Pager [phone]  If 7PM-7AM, please contact night-coverage [email or web address] Password [email or web address] 10/25/2013, 3:50 PM    LOS: 3 days

## 2013-10-25 NOTE — Progress Notes (Signed)
Subjective: She looks about the same.  Gastrostomy is draining, bag is half full.  They are adjusting TF and she is taking some clear.  Objective: Vital signs in last 24 hours: Temp:  [98.4 F (36.9 C)-99.7 F (37.6 C)] 98.4 F (36.9 C) (09/04 0409) Pulse Rate:  [100-111] 103 (09/04 0409) Resp:  [15-18] 15 (09/04 0409) BP: (152-165)/(89-103) 152/89 mmHg (09/04 0409) SpO2:  [100 %] 100 % (09/04 0409) Weight:  [67.767 kg (149 lb 6.4 oz)] 67.767 kg (149 lb 6.4 oz) (09/04 0705) Last BM Date: 10/24/13 I/O=? Afebrile, VSS BP up some WBC is normal CT brain scan:  Shows nothing to suggest Metastatic disease Intake/Output from previous day: 09/03 0701 - 09/04 0700 In: 792.5 [I.V.:792.5] Out: -  Intake/Output this shift:    General appearance: alert, cooperative and no distress GI: no change GJ tube is working well.  Lab Results:   Recent Labs  10/24/13 0508 10/25/13 0523  WBC 4.4 5.2  HGB 8.5* 9.3*  HCT 26.4* 29.9*  PLT 279 307    BMET  Recent Labs  10/24/13 0508  NA 137  K 3.9  CL 100  CO2 25  GLUCOSE 130*  BUN 4*  CREATININE 0.80  CALCIUM 8.8   PT/INR No results found for this basename: LABPROT, INR,  in the last 72 hours   Recent Labs Lab 10/20/13 1410 10/21/13 1200 10/22/13 0153 10/22/13 0650 10/24/13 0508  AST 33 43* 32 26 16  ALT 86* 82* 72* 67* 44*  ALKPHOS 147* 140* 139* 126* 97  BILITOT 0.5 0.6 0.7 0.6 0.3  PROT 8.5* 8.0 8.2 7.4 6.1  ALBUMIN 4.1 3.8 4.1 3.6 3.0*     Lipase     Component Value Date/Time   LIPASE 35 10/21/2013 1200     Studies/Results: Ct Head W Wo Contrast  10/24/2013   CLINICAL DATA:  Stomach cancer.  Intractable nausea and vomiting.  EXAM: CT HEAD WITHOUT AND WITH CONTRAST  TECHNIQUE: Contiguous axial images were obtained from the base of the skull through the vertex without and with intravenous contrast  CONTRAST:  127mL OMNIPAQUE IOHEXOL 300 MG/ML  SOLN  COMPARISON:  MRI brain without and with contrast 09/19/2013.   FINDINGS: Previously noted left extra-axial hemorrhages near completely resolved. There is no evidence for new hemorrhage or significant mass effect. No acute cortical infarct or parenchymal hemorrhage, or mass lesion is present. The ventricles are of normal size.  The postcontrast images demonstrate no pathologic enhancement.  Paranasal sinuses and mastoid air cells are clear.  IMPRESSION: 1. Near complete resolution of left subdural hematoma without evidence for new hemorrhage. 2. No pathologic enhancement to suggest metastatic disease of the brain or meninges. Previously seen dural enhancement associated with the left-sided hemorrhage is beyond the resolution of this exam. 3. No acute intracranial abnormality.   Electronically Signed   By: Lawrence Santiago M.D.   On: 10/24/2013 12:58   Dg Duanne Limerick W/water Sol Cm  10/23/2013   CLINICAL DATA:  History of gastric cancer, status post gastrojejunostomy.  EXAM: WATER SOLUBLE UPPER GI SERIES  TECHNIQUE: Single-column upper GI series was performed using water soluble contrast.  CONTRAST:  33mL OMNIPAQUE IOHEXOL 300 MG/ML  SOLN  COMPARISON:  None.  FLUOROSCOPY TIME:  2 min 5 seconds  FINDINGS: On the scout radiograph a gastrostomy tube is in place. The contrast material placed within the stomach on 10/22/2013 has progressed into the colon.  The patient ingested 50 cc of water-soluble contrast material. The esophagus is  patent. There is no evidence of esophageal mass or stricture. Postoperative appearance of the stomach compatible with gastrojejunostomy. The gastro jejunal anastomosis is patent.  When the patient is in the recumbent orientation several episodes of gastroesophageal reflux noted.  IMPRESSION: 1. Patent esophagus and gastrojejunal anastomosis. 2. Reflux.   Electronically Signed   By: Kerby Moors M.D.   On: 10/23/2013 13:00    Medications: . carvedilol  6.25 mg Oral BID WC  . docusate sodium  100 mg Oral BID  . enoxaparin (LOVENOX) injection  40 mg  Subcutaneous Q24H  . feeding supplement (PRO-STAT SUGAR FREE 64)  30 mL Per Tube TID PC  . fentaNYL  50 mcg Transdermal Q72H  . metoCLOPramide (REGLAN) injection  5 mg Intravenous 4 times per day  . pantoprazole  40 mg Oral BID AC  . protein supplement  1 packet Oral q morning - 10a  . scopolamine  1 patch Transdermal Q72H  . sennosides  5 mL Oral QHS  . sodium chloride  3 mL Intravenous Q12H    Assessment/Plan 1. Recurrent nausea/with 1 year history of nausea.  2. 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  - TUMOR INVADES LAMINA PROPRIA;  S/p Diagnostic laparoscopy, distal gastrectomy with billroth II anastamosis and feeding gastrojejunostomy tube, [name], MD, 09/20/13.  2. Hypertension/tachycardia  3. Hx of migraine/resolving subdural hematoma  4. Back pain  5. Incontinence  6. PCM  7. Anemia  8. Post op ileus    Plan:  No mechanical issues at this time we will see her back in the office for follow up of her surgery.  Please call if we can help   LOS: 3 days    Paula [name] 10/25/2013

## 2013-10-25 NOTE — Telephone Encounter (Signed)
CALLED PATIENT TO INFORM OF APPT. WITH DR. Isidore Moos ON 11-06-13 - ARRIVAL TIME - 9 AM , 9:30 AM - DR. SQUIRE AND SIM @ 10 AM ON 11-06-13, SPOKE WITH PATIENT WHEN SHE WAS AN IN-PATIENT @ [facility] , ALSO MAILED AN APPT. CARD TO PATIENT'S HOME ADDRESS PER PATIENT REQUEST.

## 2013-10-25 NOTE — Progress Notes (Signed)
[facility] Progress Note  Subjective: No complaints this morning  Objective: Vital signs in last 24 hours: Temp:  [98.4 F (36.9 C)-99.7 F (37.6 C)] 98.4 F (36.9 C) (09/04 0409) Pulse Rate:  [100-111] 103 (09/04 0409) Resp:  [15-18] 15 (09/04 0409) BP: (152-165)/(89-103) 152/89 mmHg (09/04 0409) SpO2:  [100 %] 100 % (09/04 0409) Weight:  [67.767 kg (149 lb 6.4 oz)] 67.767 kg (149 lb 6.4 oz) (09/04 0705) Weight change:    PE: She is in no distress, abdomen soft nontender  Lab Results: Results for orders placed during the hospital encounter of 10/22/13 (from the past 24 hour(s))  GLUCOSE, CAPILLARY     Status: Abnormal   Collection Time    10/24/13  3:59 PM      Result Value Ref Range   Glucose-Capillary 117 (*) 70 - 99 mg/dL   Comment 1 Documented in Chart     Comment 2 Notify RN    GLUCOSE, CAPILLARY     Status: Abnormal   Collection Time    10/24/13  8:14 PM      Result Value Ref Range   Glucose-Capillary 105 (*) 70 - 99 mg/dL   Comment 1 Notify RN    GLUCOSE, CAPILLARY     Status: None   Collection Time    10/24/13 11:54 PM      Result Value Ref Range   Glucose-Capillary 92  70 - 99 mg/dL   Comment 1 Notify RN    GLUCOSE, CAPILLARY     Status: None   Collection Time    10/25/13  4:05 AM      Result Value Ref Range   Glucose-Capillary 83  70 - 99 mg/dL   Comment 1 Notify RN    CBC     Status: Abnormal   Collection Time    10/25/13  5:23 AM      Result Value Ref Range   WBC 5.2  4.0 - 10.5 K/uL   RBC 3.70 (*) 3.87 - 5.11 MIL/uL   Hemoglobin 9.3 (*) 12.0 - 15.0 g/dL   HCT 29.9 (*) 36.0 - 46.0 %   MCV 80.8  78.0 - 100.0 fL   MCH 25.1 (*) 26.0 - 34.0 pg   MCHC 31.1  30.0 - 36.0 g/dL   RDW 14.8  11.5 - 15.5 %   Platelets 307  150 - 400 K/uL  GLUCOSE, CAPILLARY     Status: None   Collection Time    10/25/13  7:32 AM      Result Value Ref Range   Glucose-Capillary 91  70 - 99 mg/dL   Comment 1 Documented in Chart     Comment 2 Notify RN       Studies/Results: Ct Head W Wo Contrast  10/24/2013   CLINICAL DATA:  Stomach cancer.  Intractable nausea and vomiting.  EXAM: CT HEAD WITHOUT AND WITH CONTRAST  TECHNIQUE: Contiguous axial images were obtained from the base of the skull through the vertex without and with intravenous contrast  CONTRAST:  1105mL OMNIPAQUE IOHEXOL 300 MG/ML  SOLN  COMPARISON:  MRI brain without and with contrast 09/19/2013.  FINDINGS: Previously noted left extra-axial hemorrhages near completely resolved. There is no evidence for new hemorrhage or significant mass effect. No acute cortical infarct or parenchymal hemorrhage, or mass lesion is present. The ventricles are of normal size.  The postcontrast images demonstrate no pathologic enhancement.  Paranasal sinuses and mastoid air cells are clear.  IMPRESSION: 1. Near complete resolution of  left subdural hematoma without evidence for new hemorrhage. 2. No pathologic enhancement to suggest metastatic disease of the brain or meninges. Previously seen dural enhancement associated with the left-sided hemorrhage is beyond the resolution of this exam. 3. No acute intracranial abnormality.   Electronically Signed   By: Lawrence Santiago M.D.   On: 10/24/2013 12:58      Assessment: History of gastric cancer status post resection. Recurring nausea and vomiting when tube feedings are increased  Plan: Continue current plan. Attempts to reinstitute tube feedings at lower volume. Patient's GI tract appears to be volume sensitive to tube feedings.    Paula [name] 10/25/2013, 9:37 AM

## 2013-10-26 LAB — GLUCOSE, CAPILLARY
Glucose-Capillary: 108 mg/dL — ABNORMAL HIGH (ref 70–99)
Glucose-Capillary: 109 mg/dL — ABNORMAL HIGH (ref 70–99)
Glucose-Capillary: 110 mg/dL — ABNORMAL HIGH (ref 70–99)
Glucose-Capillary: 113 mg/dL — ABNORMAL HIGH (ref 70–99)
Glucose-Capillary: 145 mg/dL — ABNORMAL HIGH (ref 70–99)
Glucose-Capillary: 81 mg/dL (ref 70–99)

## 2013-10-26 MED ORDER — SODIUM CHLORIDE 0.9 % IV SOLN
INTRAVENOUS | Status: DC
  Administered 2013-10-26 – 2013-10-28 (×3): via INTRAVENOUS

## 2013-10-26 MED ORDER — METOCLOPRAMIDE HCL 5 MG/ML IJ SOLN
10.0000 mg | Freq: Four times a day (QID) | INTRAMUSCULAR | Status: DC
  Administered 2013-10-26 – 2013-10-28 (×8): 10 mg via INTRAVENOUS
  Filled 2013-10-26 (×13): qty 2

## 2013-10-26 MED ORDER — POLYETHYLENE GLYCOL 3350 17 G PO PACK
17.0000 g | PACK | Freq: Two times a day (BID) | ORAL | Status: DC
  Administered 2013-10-27: 17 g via ORAL
  Filled 2013-10-26 (×23): qty 1

## 2013-10-26 MED ORDER — HYDROCODONE-ACETAMINOPHEN 5-325 MG PO TABS
0.5000 | ORAL_TABLET | Freq: Four times a day (QID) | ORAL | Status: DC | PRN
  Administered 2013-10-27: 0.5 via ORAL
  Filled 2013-10-26: qty 1

## 2013-10-26 MED ORDER — HYDROMORPHONE HCL PF 1 MG/ML IJ SOLN
0.5000 mg | Freq: Four times a day (QID) | INTRAMUSCULAR | Status: DC | PRN
  Administered 2013-10-26 – 2013-10-28 (×6): 0.5 mg via INTRAVENOUS
  Filled 2013-10-26 (×6): qty 1

## 2013-10-26 NOTE — Progress Notes (Signed)
Says she feels great today. Discussed case with Dr Lindaann Pascal. I sont have any more suggestions at this time. Will sign off. Call if needed.

## 2013-10-26 NOTE — Progress Notes (Signed)
PROGRESS NOTE    Paula [name] TKZ:[id number] DOB: 05/26/1965 DOA: 10/22/2013 PCP: Arnoldo Morale, MD Oncologist: Dr. Curt Bears  HPI/Brief narrative 48 year old female initially admitted 09/14/13 with one year history of nausea, vomiting and epigastric pain. EGD by Dr. 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 09/20/13, she underwent diagnostic laparoscopy, distal gastrectomy with Billroth II anastomosis and feeding GJ tube by Dr. Barry Dienes. UGI on 10/02/13 showed normal post gastrojejunostomy without perforation. Since then she has had problems with by mouth and tube feeding and has had multiple admissions for abdominal pain, nausea and vomiting. During one such admission, palliative consulted and patient wishes to continue full curative treatment. Dr. [name] saw patient on 7/25 and advised her that she has at least stage III gastric cancer and would definitely need discussion of adjuvant treatment including chemotherapy and XRT-during outpatient followup. She was readmitted with intractable nausea and vomiting.   Assessment/Plan:  1. Intractable nausea and vomiting: Unclear etiology. UGI results 9/2-normal. KUB-no acute findings. GJ tube position confirmed by imaging. Continue IV PPI and Reglan. CT head with contrast 9/3-no acute findings or brain metastases. She was started on tube feeds at 10 ml an hour and advanced to 20 mL per hour which she tolerated but started having nausea and vomiting when it was increased to 30 mL an hour. Resume tube feeding at 10 mL per hour and advance to 20 mL per hour-request dietitian to increase calorie concentration. No mechanical causes for her vomiting.? Psychogenic etiology. Recommendation of Ativan 1 mg 3 times a day trial and if that doesn't work then consider Zyprexa. If patient unable to tolerate adequate tube/by mouth feeding to achieve adequate nutritional state,  we'll need to go back on TNA. Jejunostomy tube blocked and leaking-surgery has recommended IR exchange tube-then resume tube feeds. Patient tolerating clears and she says that she feels much better. Advance to full liquids. Minimized opioids. Increasing Reglan and MiraLAX. 2. Gastric cancer, at least stage III: Discussed with Dr. [name] on 9/2 who recommends outpatient followup when GI issues have settled for discussion regarding further cancer treatment. CT head negative for metastasis. Radiation oncology input appreciated. 3. Chronic Anemia: no reported bleeding. Hb stable. 4. Hypertension: Controlled. Continue carvedilol. 5. History of GERD: On IV PPI. 6. Severe malnutrition in the context of chronic illness: Await nutrition management pending GI evaluation as above.   Code Status: Full Family Communication: None at bedside Disposition Plan: Home when medically stable   Consultants:  General surgery  Gastroenterology  Radiation oncology  Interventional radiology  Procedures:  None  Antibiotics:  None   Subjective: Jejunostomy tube blocked and leaking. States that she has tolerated clear liquids and even snuck in a piece of her sons [place] yesterday and nutritional drink day before and tolerated  Objective: Filed Vitals:   10/25/13 0705 10/25/13 1324 10/25/13 2000 10/26/13 0420  BP:  126/75 132/80 122/75  Pulse:  78 79 80  Temp:  98.7 F (37.1 C) 98.4 F (36.9 C) 98.1 F (36.7 C)  TempSrc:  Oral  Oral  Resp:  16 18 16   Height:      Weight: 67.767 kg (149 lb 6.4 oz)   67.994 kg (149 lb 14.4 oz)  SpO2:  100% 100% 99%    Intake/Output Summary (Last 24 hours) at 10/26/13 1117 Last data filed at 10/26/13 0745  Gross per 24 hour  Intake 898.33 ml  Output    450 ml  Net  448.33 ml   Filed Weights   10/24/13 0414 10/25/13 0705 10/26/13 0420  Weight: 68.04 kg (150 lb) 67.767 kg (149 lb 6.4 oz) 67.994 kg (149 lb 14.4 oz)     Exam:  General exam: Pleasant young  female comfortably ambulating in the hall. Respiratory system: Clear. No increased work of breathing. Cardiovascular system: S1 & S2 heard, RRR. No JVD, murmurs, gallops, clicks or pedal edema. Gastrointestinal system: Abdomen is nondistended, soft and nontender. Normal bowel sounds heard. GJ tube site without acute findings. Gastrostomy tube connected to drainage bag-one third full. Central nervous system: Alert and oriented. No focal neurological deficits. Extremities: Symmetric 5 x 5 power.   Data Reviewed: Basic Metabolic Panel:  Recent Labs Lab 10/20/13 1410 10/21/13 1200 10/22/13 0153 10/22/13 0650 10/24/13 0508  NA 141 136* 136* 134* 137  K 4.3 4.3 4.0 4.3 3.9  CL 98 96 95* 96 100  CO2 26 24 24 23 25   GLUCOSE 123* 128* 106* 103* 130*  BUN 6 6 6  5* 4*  CREATININE 0.90 0.82 0.74 0.68 0.80  CALCIUM 10.4 9.5 9.6 9.2 8.8  MG  --   --   --  1.6  --   PHOS  --   --   --  3.6  --    Liver Function Tests:  Recent Labs Lab 10/20/13 1410 10/21/13 1200 10/22/13 0153 10/22/13 0650 10/24/13 0508  AST 33 43* 32 26 16  ALT 86* 82* 72* 67* 44*  ALKPHOS 147* 140* 139* 126* 97  BILITOT 0.5 0.6 0.7 0.6 0.3  PROT 8.5* 8.0 8.2 7.4 6.1  ALBUMIN 4.1 3.8 4.1 3.6 3.0*    Recent Labs Lab 10/19/13 1820 10/21/13 1200  LIPASE 50 35   No results found for this basename: AMMONIA,  in the last 168 hours CBC:  Recent Labs Lab 10/19/13 1820 10/20/13 1410 10/21/13 1200 10/22/13 0650 10/24/13 0508 10/25/13 0523  WBC 4.5 5.4 4.4 5.5 4.4 5.2  NEUTROABS 2.3 3.4 3.4  --   --   --   HGB 10.0* 10.7* 10.3* 9.5* 8.5* 9.3*  HCT 31.3* 33.5* 32.6* 31.0* 26.4* 29.9*  MCV 80.5 80.5 79.5 81.4 80.0 80.8  PLT 329 353 382 307 279 307   Cardiac Enzymes:  Recent Labs Lab 10/21/13 1200  TROPONINI <0.30   BNP (last 3 results) No results found for this basename: PROBNP,  in the last 8760 hours CBG:  Recent Labs Lab 10/25/13 1701 10/25/13 1958 10/25/13 2345 10/26/13 0411  10/26/13 0757  GLUCAP 93 82 95 81 113*    No results found for this or any previous visit (from the past 240 hour(s)).     Studies: Ct Head W Wo Contrast  10/24/2013   CLINICAL DATA:  Stomach cancer.  Intractable nausea and vomiting.  EXAM: CT HEAD WITHOUT AND WITH CONTRAST  TECHNIQUE: Contiguous axial images were obtained from the base of the skull through the vertex without and with intravenous contrast  CONTRAST:  155mL OMNIPAQUE IOHEXOL 300 MG/ML  SOLN  COMPARISON:  MRI brain without and with contrast 09/19/2013.  FINDINGS: Previously noted left extra-axial hemorrhages near completely resolved. There is no evidence for new hemorrhage or significant mass effect. No acute cortical infarct or parenchymal hemorrhage, or mass lesion is present. The ventricles are of normal size.  The postcontrast images demonstrate no pathologic enhancement.  Paranasal sinuses and mastoid air cells are clear.  IMPRESSION: 1. Near complete resolution of left subdural hematoma without evidence for new hemorrhage. 2. No  pathologic enhancement to suggest metastatic disease of the brain or meninges. Previously seen dural enhancement associated with the left-sided hemorrhage is beyond the resolution of this exam. 3. No acute intracranial abnormality.   Electronically Signed   By: Lawrence Santiago M.D.   On: 10/24/2013 12:58        Scheduled Meds: . carvedilol  6.25 mg Oral BID WC  . enoxaparin (LOVENOX) injection  40 mg Subcutaneous Q24H  . feeding supplement (PRO-STAT SUGAR FREE 64)  30 mL Per Tube TID PC  . fentaNYL  50 mcg Transdermal Q72H  . LORazepam  1 mg Oral TID  . metoCLOPramide (REGLAN) injection  10 mg Intravenous 4 times per day  . pantoprazole  40 mg Oral BID AC  . polyethylene glycol  17 g Oral BID  . protein supplement  1 packet Oral q morning - 10a  . scopolamine  1 patch Transdermal Q72H  . sennosides  5 mL Oral QHS  . sodium chloride  3 mL Intravenous Q12H   Continuous Infusions: . sodium  chloride 50 mL/hr at 10/26/13 0104  . feeding supplement (OSMOLITE 1.5 CAL) 1,000 mL (10/23/13 1700)    Active Problems:   Essential hypertension   Gastric carcinoma s/p Distal gastrectomy/GJ (Billroth II) 09/20/2013   Intractable nausea and vomiting   Protein-calorie malnutrition, severe   Anemia, unspecified    Time spent: 30 minutes.    Vernell Leep, MD, FACP, FHM. [facility] Pager [phone]  If 7PM-7AM, please contact night-coverage [email or web address] Password [email or web address] 10/26/2013, 11:17 AM    LOS: 4 days

## 2013-10-26 NOTE — Progress Notes (Signed)
Dr. Johney Maine called and made aware that tube feeds were stopped due to clogged tube/leaking tube.  He recommended tube to be switched out via guide wire with interventional radiology.  Dr. Algis Liming paged and made aware.

## 2013-10-26 NOTE — Progress Notes (Signed)
Paula [name]  01-May-1965 [id number]  Patient Care Team: Arnoldo Morale, MD as PCP - General (Family Medicine) Curt Bears, MD as Consulting Physician (Oncology) [name], MD as Consulting Physician (Surgical Oncology)  This patient is a 48 y.o.female who calls today for surgical evaluation.   Date of procedure/visit: 09/20/2013  Surgery:  POST-OPERATIVE DIAGNOSIS: Same  PROCEDURE: Procedure(s):  Diagnostic laparoscopy, distal gastrectomy with billroth II anastamosis and feeding gastrojejunostomy tube  SURGEON: Surgeon(s):  [name], MD      Reason for call: Clogged/leaking GJ tube  Patient status post distal gastrectomy with Billroth II anastomosis.  Had feeding gastrojejunostomy tube placed her stomach.  Patient still has with prolonged gastric ileus and nausea and vomiting.  Has been on TNA.  Restarting jejunal tube feeds.  Jejunal limb of gastrojejunostomy clotted.  In unclogging it, the tube now leaks.  Patient still dependent on tube feeds.  Because it is more than 4 weeks out from surgery, it is safe to have the gastrojejunal tube replaced percutaneously.  We will ask radiology to do this under fluoroscopy.  Hopefully pass the jejunal tip down more distally so patient did have distal jejunal tube feeds until gastric ileus resolves.  Increase Reglan for nausea & ileus.  Try to advance by mouth gradually.  More aggressive bowel regimen with MiraLAX.  Type discussed with the patient's nurse, Judson Roch.  Discussed with Dr. Algis Liming with primary medicine team.  Patient Active Problem List   Diagnosis Date Noted  . Cancer of antrum of stomach 10/25/2013  . Protein-calorie malnutrition, severe 10/23/2013  . Anemia, unspecified 10/23/2013  . Intractable nausea and vomiting 10/22/2013  . Back pain 10/10/2013  . Malnutrition of moderate degree 09/19/2013  . 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 Antral ulcer 09/16/2013   . Migraine headache 09/14/2013  . PTSD (post-traumatic stress disorder) 08/19/2013  . Left knee injury 07/30/2013  . Low back pain 07/30/2013  . Mitral regurgitation 02/15/2013  . Chest pain 01/23/2012  . Essential hypertension 01/23/2012  . Murmur 01/23/2012    Past Medical History  Diagnosis Date  . Headache(784.0) 2011    Migraines  . Esophageal reflux     On omeprazole  . Costochondritis   . Hypertension     started around 2011  . Ovarian cyst   . Stomach cancer     Past Surgical History  Procedure Laterality Date  . Oophorectomy      Around 1985 left ovary removal  . Tonsillectomy      Around 1994  . Tubal ligation    . Esophagogastroduodenoscopy N/A 09/16/2013    Procedure: ESOPHAGOGASTRODUODENOSCOPY (EGD);  Surgeon: Wonda Horner, MD;  Location: [facility];  Service: Endoscopy;  Laterality: N/A;  . Laparoscopy N/A 09/20/2013    Procedure: DIAGNOSTIC LAPAROSCOPY,DISTAL GASTRECTOMY AND FEEDING GASTROJEJUNOSTOMY;  Surgeon: [name], MD;  Location: [facility];  Service: General;  Laterality: N/A;    History   Social History  . Marital Status: Married    Spouse Name: N/A    Number of Children: 2  . Years of Education: N/A   Occupational History  .  [employer]   Social History Main Topics  . Smoking status: Former Research scientist (life sciences)  . Smokeless tobacco: Never Used     Comment: Stopped 2004  . Alcohol Use: Yes     Comment: occ  . Drug Use: No  . Sexual Activity: Not on file   Other Topics Concern  . Not on file  Social History Narrative  . No narrative on file    Family History  Problem Relation Age of Onset  . Hypertension Mother   . Diabetes Mellitus II Mother   . Hypertension Sister   . Hypertension Brother   . Heart failure Maternal Grandmother   . Hypertension Maternal Grandfather     Current Facility-Administered Medications  Medication Dose Route Frequency Provider Last Rate Last Dose  . 0.9 %  sodium chloride infusion   Intravenous  Continuous Modena Jansky, MD 50 mL/hr at 10/26/13 0104    . acetaminophen (TYLENOL) tablet 650 mg  650 mg Oral Q6H PRN Toy Baker, MD       Or  . acetaminophen (TYLENOL) suppository 650 mg  650 mg Rectal Q6H PRN Toy Baker, MD      . carvedilol (COREG) tablet 6.25 mg  6.25 mg Oral BID WC Toy Baker, MD   6.25 mg at 10/25/13 1848  . enoxaparin (LOVENOX) injection 40 mg  40 mg Subcutaneous Q24H Toy Baker, MD   40 mg at 10/25/13 1108  . feeding supplement (OSMOLITE 1.5 CAL) liquid 1,000 mL  1,000 mL Per Tube Continuous Hazle Coca, RD 10 mL/hr at 10/23/13 1700 1,000 mL at 10/23/13 1700  . feeding supplement (PRO-STAT SUGAR FREE 64) liquid 30 mL  30 mL Per Tube TID PC Hazle Coca, RD   30 mL at 10/25/13 1109  . fentaNYL (DURAGESIC - dosed mcg/hr) patch 50 mcg  50 mcg Transdermal Q72H Toy Baker, MD   50 mcg at 10/25/13 0541  . HYDROcodone-acetaminophen (NORCO/VICODIN) 5-325 MG per tablet 1 tablet  1 tablet Oral Q6H PRN Modena Jansky, MD      . HYDROmorphone (DILAUDID) injection 0.5 mg  0.5 mg Intravenous Q4H PRN Modena Jansky, MD   0.5 mg at 10/26/13 0105  . LORazepam (ATIVAN) tablet 1 mg  1 mg Oral TID Modena Jansky, MD   1 mg at 10/26/13 1020  . metoCLOPramide (REGLAN) injection 10 mg  10 mg Intravenous 4 times per day Michael Boston, MD      . ondansetron [facility]) tablet 4 mg  4 mg Oral Q6H PRN Toy Baker, MD       Or  . ondansetron (ZOFRAN) injection 4 mg  4 mg Intravenous Q6H PRN Toy Baker, MD   4 mg at 10/25/13 1856  . pantoprazole (PROTONIX) EC tablet 40 mg  40 mg Oral BID AC Modena Jansky, MD   40 mg at 10/25/13 1848  . polyethylene glycol (MIRALAX / GLYCOLAX) packet 17 g  17 g Oral BID Michael Boston, MD      . promethazine (PHENERGAN) injection 12.5-25 mg  12.5-25 mg Intravenous Q6H PRN Modena Jansky, MD   25 mg at 10/25/13 0113  . protein supplement (UNJURY CHICKEN SOUP) powder 1 packet  1 packet Oral q  morning - 10a Hazle Coca, RD   1 packet at 10/25/13 1110  . scopolamine (TRANSDERM-SCOP) 1 MG/3DAYS 1.5 mg  1 patch Transdermal Q72H Toy Baker, MD   1.5 mg at 10/25/13 0541  . sennosides (SENOKOT) 8.8 MG/5ML syrup 5 mL  5 mL Oral QHS Anastassia Doutova, MD      . sodium chloride 0.9 % injection 3 mL  3 mL Intravenous Q12H Toy Baker, MD      . zolpidem (AMBIEN) tablet 5 mg  5 mg Oral QHS PRN Toy Baker, MD         No Known Allergies  BP 122/75  Pulse 80  Temp(Src) 98.1 F (36.7 C) (Oral)  Resp 16  Ht 5' (1.524 m)  Wt 149 lb 14.4 oz (67.994 kg)  BMI 29.28 kg/m2  SpO2 99%  LMP 09/12/2013  Dg Abd 1 View  10/11/2013   CLINICAL DATA:  Intolerance of jejunostomy tube feedings.  EXAM: ABDOMEN - 1 VIEW  COMPARISON:  Abdomen series 8/19 2015.  FINDINGS: Surgical sutures left upper quadrant. Small amount of residual contrast in the colon from prior studies. Jejunostomy tube was injected with nonionic contrast. Jejunostomy tube is widely patent. Proximal small bowel is nondilated.  IMPRESSION: 1. Patent jejunostomy tube.  Proximal small bowel nondilated. 2. No acute abnormality.   Electronically Signed   By: Marcello Moores  Register   On: 10/11/2013 12:11   Dg Abd 1 View  10/09/2013   CLINICAL DATA:  Fecal impaction.  EXAM: ABDOMEN - 1 VIEW  COMPARISON:  September 26, 2013.  FINDINGS: Residual contrast is noted throughout the colon. There is no evidence of bowel obstruction or ileus. Gastrostomy tube is noted in the left upper quadrant. Phleboliths are noted in the pelvis.  IMPRESSION: No evidence of bowel obstruction or ileus. Residual contrast is noted throughout the colon.   Electronically Signed   By: Sabino Dick M.D.   On: 10/09/2013 15:24   Dg Abd 1 View  09/26/2013   CLINICAL DATA:  Postop ileus.  EXAM: ABDOMEN - 1 VIEW  COMPARISON:  No prior.  FINDINGS: Soft tissue structures are unremarkable. Surgical tubing noted over the right abdomen. Surgical staples and clips noted  in the abdomen. Gas pattern is nonspecific. No free air. No acute bony abnormality. Lung bases are clear.  IMPRESSION: Postsurgical changes.  No evidence of bowel distention.   Electronically Signed   By: [place]   On: 09/26/2013 20:28   Ct Head W Wo Contrast  10/24/2013   CLINICAL DATA:  Stomach cancer.  Intractable nausea and vomiting.  EXAM: CT HEAD WITHOUT AND WITH CONTRAST  TECHNIQUE: Contiguous axial images were obtained from the base of the skull through the vertex without and with intravenous contrast  CONTRAST:  141mL OMNIPAQUE IOHEXOL 300 MG/ML  SOLN  COMPARISON:  MRI brain without and with contrast 09/19/2013.  FINDINGS: Previously noted left extra-axial hemorrhages near completely resolved. There is no evidence for new hemorrhage or significant mass effect. No acute cortical infarct or parenchymal hemorrhage, or mass lesion is present. The ventricles are of normal size.  The postcontrast images demonstrate no pathologic enhancement.  Paranasal sinuses and mastoid air cells are clear.  IMPRESSION: 1. Near complete resolution of left subdural hematoma without evidence for new hemorrhage. 2. No pathologic enhancement to suggest metastatic disease of the brain or meninges. Previously seen dural enhancement associated with the left-sided hemorrhage is beyond the resolution of this exam. 3. No acute intracranial abnormality.   Electronically Signed   By: Lawrence Santiago M.D.   On: 10/24/2013 12:58   Mr Lumbar Spine W Wo Contrast  10/11/2013   CLINICAL DATA:  Gastric carcinoma.  Abnormal bone scan at L1.  EXAM: MRI LUMBAR SPINE WITHOUT AND WITH CONTRAST  TECHNIQUE: Multiplanar and multiecho pulse sequences of the lumbar spine were obtained without and with intravenous contrast.  CONTRAST:  15 mL MultiHance IV  COMPARISON:  Bone scan 10/11/2013  FINDINGS: Transitional anatomy.  L5 is partially incorporated into the sacrum.  Negative for fracture or mass lesion. Vertebral body signal is normal with  particular attention L1. No evidence of  bone marrow edema or mass. No enhancing lesions are seen postcontrast infusion. Etiology of the uptake on the bone scan is unclear.  Negative for fracture. Conus medullaris is normal. No significant degenerative change in the lumbar spine. No disc protrusion or spinal stenosis.  IMPRESSION: Negative for fracture or metastatic disease. Bone marrow signal is normal. No explanation for the bone scan finding at L1.   Electronically Signed   By: Franchot Gallo M.D.   On: 10/11/2013 19:56   Nm Bone Scan Whole Body  10/11/2013   CLINICAL DATA:  Back pain.  Signet cell gastric carcinoma.  EXAM: NUCLEAR MEDICINE WHOLE BODY BONE SCAN  TECHNIQUE: Whole body anterior and posterior images were obtained approximately 3 hours after intravenous injection of radiopharmaceutical.  RADIOPHARMACEUTICALS:  24.5 mCi Technetium-99 MDP  COMPARISON:  CT chest, abdomen, and pelvis 09/13/2013  FINDINGS: There is a small focus of increased radiotracer uptake in the spine slightly left of midline at approximately L1, best seen on the anterior images. Uptake throughout the remainder of the axial skeleton is symmetric. Uptake involving the left great toe is likely degenerative. Normal uptake is seen in the kidneys with excretion in the bladder.  IMPRESSION: Focal uptake in the spine at approximately L1, with considerations including degenerative change as well as metastasis. Further evaluation with MRI may be helpful.   Electronically Signed   By: Logan Bores   On: 10/11/2013 11:42   Dg Chest Port 1 View  09/30/2013   CLINICAL DATA:  PICC placement.  EXAM: PORTABLE CHEST - 1 VIEW  COMPARISON:  Chest radiograph performed 09/12/2013, and CTA of the chest performed 09/13/2013  FINDINGS: The lungs are well-aerated. Minimal right basilar atelectasis is noted. There is no evidence of pleural effusion or pneumothorax.  The cardiomediastinal silhouette is borderline enlarged. No acute osseous abnormalities  are seen. A right PICC is noted ending about the distal SVC.  IMPRESSION: 1. Right PICC noted ending about the distal SVC. 2. Borderline cardiomegaly; minimal right basilar atelectasis noted.   Electronically Signed   By: Garald Balding M.D.   On: 09/30/2013 06:09   Dg Abd Acute W/chest  10/21/2013   CLINICAL DATA:  Gastric carcinoma. Abdominal pain. Vomiting. Cough.  EXAM: ACUTE ABDOMEN SERIES (ABDOMEN 2 VIEW & CHEST 1 VIEW)  COMPARISON:  None.  FINDINGS: Percutaneous gastrostomy tube again seen in left upper quadrant. Surgical clips and staples noted. No evidence of dilated bowel loops or free air.  Mild elevation of right hemidiaphragm and right basilar pleural- parenchymal scarring appears stable. No evidence of acute infiltrate or edema. No evidence of pleural effusion. Heart size is within normal limits. No mass or lymphadenopathy identified.  IMPRESSION: Unremarkable bowel gas pattern.  No acute findings.  Stable right basilar pleural- parenchymal scarring. No active lung disease.   Electronically Signed   By: Earle Gell M.D.   On: 10/21/2013 14:22   Dg Abd Acute W/chest  10/20/2013   CLINICAL DATA:  Worsening abdominal pain after recent discharge. Recent diagnosis of gastric cancer.  EXAM: ACUTE ABDOMEN SERIES (ABDOMEN 2 VIEW & CHEST 1 VIEW)  COMPARISON:  Acute abdominal series 10/19/2013  FINDINGS: Similar appearance of right basilar scarring. Otherwise, the lungs are clear. Stable cardiac and mediastinal contours. No free air. Percutaneous gastrostomy tube appears in similar position and projects over the stomach. Staple lines are noted in the left upper quadrant suggesting partial gastric resection. No evidence of ascites or bowel obstruction. No acute osseous abnormality.  IMPRESSION: 1. No acute  cardiopulmonary process. 2. Stable right basilar atelectasis versus scarring. 3. Stable position of gastrostomy tube. 4. Nonobstructed bowel gas pattern. 5. No evidence of free air or ascites.    Electronically Signed   By: Jacqulynn Cadet M.D.   On: 10/20/2013 14:54   Dg Abd Acute W/chest  10/19/2013   CLINICAL DATA:  48 year old female with abdominal pain and nausea. Recent diagnosis of gastric cancer and surgery.  EXAM: ACUTE ABDOMEN SERIES (ABDOMEN 2 VIEW & CHEST 1 VIEW)  COMPARISON:  10/13/2013 and prior radiographs  FINDINGS: The cardiomediastinal silhouette is unremarkable.  Mild right basilar atelectasis/scarring again identified.  No airspace disease, pleural effusion or pneumothorax.  Surgical sutures overlying the left abdomen noted.  Jejunostomy catheter is again noted.  The bowel gas pattern is unremarkable without evidence of bowel obstruction or pneumoperitoneum.  No suspicious calcifications are identified.  The bony structures are unremarkable.  IMPRESSION: No evidence of acute abnormality.  Normal bowel gas pattern.   Electronically Signed   By: Hassan Rowan M.D.   On: 10/19/2013 19:26   Dg Abd Acute W/chest  10/13/2013   CLINICAL DATA:  Abdominal pain and vomiting. Discharged today from a 30 day hospital stay.  EXAM: ACUTE ABDOMEN SERIES (ABDOMEN 2 VIEW & CHEST 1 VIEW)  COMPARISON:  10/11/2013  FINDINGS: Right PICC catheter with tip over the cavoatrial junction. Slightly shallow inspiration. Fibrosis or atelectasis in the right lung base. No focal consolidation. No blunting of costophrenic angles. Normal heart size and pulmonary vascularity.  Postoperative changes in the left upper quadrant with gastrostomy tube present. Residual contrast material in the colon without distention. No small bowel distention. No free intra-abdominal air. No abnormal air-fluid levels. No radiopaque stones. Visualized bones appear intact.  IMPRESSION: Fibrosis or atelectasis in the right lung base. Nonobstructive bowel gas pattern. No free air. Residual contrast material in the colon.   Electronically Signed   By: Lucienne Capers M.D.   On: 10/13/2013 21:15   Dg Abd Portable 2v  10/22/2013   CLINICAL  DATA:  Status post partial gastrectomy for gastric cancer with Billroth II and gastrojejunostomy  EXAM: PORTABLE ABDOMEN - 2 VIEW  COMPARISON:  Abdominal radiographs dated 10/21/2013. CT abdomen pelvis dated 09/13/2013.  FINDINGS: Patient is status post distal gastrectomy with gastrojejunostomy.  Initially, 25 mL contrast was administered via the J-tube. Contrast opacifies the proximal jejunum as well as the residual stomach.  Subsequently, 25 mL contrast was administered via the G-tube lumen. Additional contrast opacifies the residual stomach, confirming intragastric placement.  The distal tip of the J-tube is outlined on the second radiograph and likely resides in the proximal aspect of the jejunum just distal to the gastrojejunostomy.  IMPRESSION: Status post distal gastrectomy with gastrojejunostomy.  Intragastric placement of the gastrostomy tube is confirmed.  Jejunostomy tube likely resides in the proximal jejunum just distal to the gastrojejunostomy.   Electronically Signed   By: Julian Hy M.D.   On: 10/22/2013 16:08   Dg Ugi W/water Sol Cm  10/23/2013   CLINICAL DATA:  History of gastric cancer, status post gastrojejunostomy.  EXAM: WATER SOLUBLE UPPER GI SERIES  TECHNIQUE: Single-column upper GI series was performed using water soluble contrast.  CONTRAST:  9mL OMNIPAQUE IOHEXOL 300 MG/ML  SOLN  COMPARISON:  None.  FLUOROSCOPY TIME:  2 min 5 seconds  FINDINGS: On the scout radiograph a gastrostomy tube is in place. The contrast material placed within the stomach on 10/22/2013 has progressed into the colon.  The patient ingested 50  cc of water-soluble contrast material. The esophagus is patent. There is no evidence of esophageal mass or stricture. Postoperative appearance of the stomach compatible with gastrojejunostomy. The gastro jejunal anastomosis is patent.  When the patient is in the recumbent orientation several episodes of gastroesophageal reflux noted.  IMPRESSION: 1. Patent esophagus  and gastrojejunal anastomosis. 2. Reflux.   Electronically Signed   By: Kerby Moors M.D.   On: 10/23/2013 13:00   Dg Ugi W/water Sol Cm  10/02/2013   CLINICAL DATA:  Status post Billroth 2 for gastric carcinoma 2 weeks ago. Persistent nausea and vomiting.  EXAM: WATER SOLUBLE UPPER GI SERIES  TECHNIQUE: Single-column upper GI series was performed using water soluble contrast. Preprocedure scout film was also performed.  CONTRAST:  179mL OMNIPAQUE IOHEXOL 300 MG/ML  SOLN  COMPARISON:  09/26/2013 acute abdomen series.  CT of 09/13/2013.  FLUOROSCOPY TIME:  2 min and 45 seconds  FINDINGS: Preprocedure scout film demonstrates a left-sided gastrojejunostomy tube. No free intraperitoneal air. Non-obstructive bowel gas pattern.  Focused single-contrast exam, with the patient nearly upright (unable to be positioned supine secondary to discomfort). Normal postoperative appearance of the residual stomach. Contrast traverses the gastrojejunostomy site, without extravasation or obstruction. Normal caliber of proximal small bowel loops.  IMPRESSION: Expected appearance after gastrojejunostomy, without perforation or bowel obstruction.   Electronically Signed   By: Abigail Miyamoto M.D.   On: 10/02/2013 11:46    Note: This dictation was prepared with Dragon/digital dictation along with [employer]. Any transcriptional errors that result from this process are unintentional.

## 2013-10-27 ENCOUNTER — Inpatient Hospital Stay (HOSPITAL_COMMUNITY): Payer: No Typology Code available for payment source

## 2013-10-27 LAB — GLUCOSE, CAPILLARY
GLUCOSE-CAPILLARY: 102 mg/dL — AB (ref 70–99)
GLUCOSE-CAPILLARY: 101 mg/dL — AB (ref 70–99)
Glucose-Capillary: 106 mg/dL — ABNORMAL HIGH (ref 70–99)
Glucose-Capillary: 109 mg/dL — ABNORMAL HIGH (ref 70–99)
Glucose-Capillary: 105 mg/dL — ABNORMAL HIGH (ref 70–99)

## 2013-10-27 MED ORDER — IOHEXOL 300 MG/ML  SOLN
10.0000 mL | Freq: Once | INTRAMUSCULAR | Status: AC | PRN
  Administered 2013-10-27: 10 mL

## 2013-10-27 MED ORDER — LIDOCAINE VISCOUS 2 % MT SOLN
OROMUCOSAL | Status: AC
  Administered 2013-10-27: 10 mL
  Filled 2013-10-27: qty 15

## 2013-10-27 NOTE — Progress Notes (Signed)
PROGRESS NOTE    Paula [name] QAS:[id number] DOB: Apr 16, 1965 DOA: 10/22/2013 PCP: Arnoldo Morale, MD Oncologist: Dr. Curt Bears  HPI/Brief narrative 48 year old female initially admitted 09/14/13 with one year history of nausea, vomiting and epigastric pain. EGD by Dr. 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 09/20/13, she underwent diagnostic laparoscopy, distal gastrectomy with Billroth II anastomosis and feeding GJ tube by Dr. Barry Dienes. UGI on 10/02/13 showed normal post gastrojejunostomy without perforation. Since then she has had problems with by mouth and tube feeding and has had multiple admissions for abdominal pain, nausea and vomiting. During one such admission, palliative consulted and patient wishes to continue full curative treatment. Dr. [name] saw patient on 7/25 and advised her that she has at least stage III gastric cancer and would definitely need discussion of adjuvant treatment including chemotherapy and XRT-during outpatient followup. She was readmitted with intractable nausea and vomiting. Despite extensive workup, she is still unable to keep significant amount by mouth or via tube feeds.   Assessment/Plan:  1. Intractable nausea and vomiting: Unclear etiology. UGI results 9/2-normal. KUB-no acute findings. GJ tube position confirmed by imaging. Continue IV PPI and Reglan. CT head with contrast 9/3-no acute findings or brain metastases. She was started on tube feeds at 10 ml an hour and advanced to 20 mL per hour which she tolerated but started having nausea and vomiting when it was increased to 30 mL an hour. Resume tube feeding at 10 mL per hour and advance to 20 mL per hour-request dietitian to increase calorie concentration. No mechanical causes for her vomiting.? Psychogenic etiology. Recommendation of Ativan 1 mg 3 times a day trial and if that doesn't work then consider Zyprexa.  If patient unable to tolerate adequate tube/by mouth feeding to achieve adequate nutritional state, we'll need to go back on TNA. Jejunostomy tube blocked and leaking- IR tube exchange pending- nurses will call IR. Increased Reglan and MiraLAX. Patient apparently tolerated only small amounts of full liquids and? Vomiting x2 yesterday. 2. Gastric cancer, at least stage III: Discussed with Dr. [name] on 9/2 who recommends outpatient followup when GI issues have settled for discussion regarding further cancer treatment. CT head negative for metastasis. Radiation oncology input appreciated. 3. Chronic Anemia: no reported bleeding. Hb stable. 4. Hypertension: Controlled. Continue carvedilol. 5. History of GERD: On IV PPI. 6. Severe malnutrition in the context of chronic illness: Await nutrition management pending GI evaluation as above. 7. ?? Confusion: As per nursing report on 9/6, patient apparently confused overnight but no details available and my nurse's left for the day. No documentation. Patient states that she might have been confused because she had "dozed off". Seems somnolent but coherent this morning. Monitor closely.   Code Status: Full Family Communication: None at bedside Disposition Plan: Home when medically stable   Consultants:  General surgery  Gastroenterology  Radiation oncology  Interventional radiology  Procedures:  None  Antibiotics:  None   Subjective: Tolerated small amounts of liquids yesterday and states she vomited small amounts x2 between meals yesterday. No abdominal pain. As per nursing, confusion overnight but no further details available.  Objective: Filed Vitals:   10/26/13 0420 10/26/13 1353 10/26/13 2005 10/27/13 0520  BP: 122/75 111/70 113/71 110/68  Pulse: 80 89 84 90  Temp: 98.1 F (36.7 C) 98.4 F (36.9 C) 97.9 F (36.6 C) 98.9 F (37.2 C)  TempSrc: Oral Oral Oral Axillary  Resp: 16 16 16  16  Height:      Weight: 67.994 kg (149 lb  14.4 oz)     SpO2: 99% 99% 99% 100%    Intake/Output Summary (Last 24 hours) at 10/27/13 0745 Last data filed at 10/26/13 2200  Gross per 24 hour  Intake   1210 ml  Output      0 ml  Net   1210 ml   Filed Weights   10/24/13 0414 10/25/13 0705 10/26/13 0420  Weight: 68.04 kg (150 lb) 67.767 kg (149 lb 6.4 oz) 67.994 kg (149 lb 14.4 oz)     Exam:  General exam: Pleasant young female comfo lying in bed Respiratory system: Clear. No increased work of breathing. Cardiovascular system: S1 & S2 heard, RRR. No JVD, murmurs, gallops, clicks or pedal edema. Gastrointestinal system: Abdomen is nondistended, soft and nontender. Normal bowel sounds heard. GJ tube site without acute findings. Gastrostomy tube connected to drainage bag-one third full. Central nervous system: Somnolent but easily arousable and oriented x3. No focal neurological deficits. Extremities: Symmetric 5 x 5 power.   Data Reviewed: Basic Metabolic Panel:  Recent Labs Lab 10/20/13 1410 10/21/13 1200 10/22/13 0153 10/22/13 0650 10/24/13 0508  NA 141 136* 136* 134* 137  K 4.3 4.3 4.0 4.3 3.9  CL 98 96 95* 96 100  CO2 26 24 24 23 25   GLUCOSE 123* 128* 106* 103* 130*  BUN 6 6 6  5* 4*  CREATININE 0.90 0.82 0.74 0.68 0.80  CALCIUM 10.4 9.5 9.6 9.2 8.8  MG  --   --   --  1.6  --   PHOS  --   --   --  3.6  --    Liver Function Tests:  Recent Labs Lab 10/20/13 1410 10/21/13 1200 10/22/13 0153 10/22/13 0650 10/24/13 0508  AST 33 43* 32 26 16  ALT 86* 82* 72* 67* 44*  ALKPHOS 147* 140* 139* 126* 97  BILITOT 0.5 0.6 0.7 0.6 0.3  PROT 8.5* 8.0 8.2 7.4 6.1  ALBUMIN 4.1 3.8 4.1 3.6 3.0*    Recent Labs Lab 10/21/13 1200  LIPASE 35   No results found for this basename: AMMONIA,  in the last 168 hours CBC:  Recent Labs Lab 10/20/13 1410 10/21/13 1200 10/22/13 0650 10/24/13 0508 10/25/13 0523  WBC 5.4 4.4 5.5 4.4 5.2  NEUTROABS 3.4 3.4  --   --   --   HGB 10.7* 10.3* 9.5* 8.5* 9.3*  HCT 33.5*  32.6* 31.0* 26.4* 29.9*  MCV 80.5 79.5 81.4 80.0 80.8  PLT 353 382 307 279 307   Cardiac Enzymes:  Recent Labs Lab 10/21/13 1200  TROPONINI <0.30   BNP (last 3 results) No results found for this basename: PROBNP,  in the last 8760 hours CBG:  Recent Labs Lab 10/26/13 1234 10/26/13 1635 10/26/13 2000 10/26/13 2358 10/27/13 0409  GLUCAP 145* 108* 109* 110* 106*    No results found for this or any previous visit (from the past 240 hour(s)).     Studies: No results found.      Scheduled Meds: . carvedilol  6.25 mg Oral BID WC  . enoxaparin (LOVENOX) injection  40 mg Subcutaneous Q24H  . feeding supplement (PRO-STAT SUGAR FREE 64)  30 mL Per Tube TID PC  . fentaNYL  50 mcg Transdermal Q72H  . LORazepam  1 mg Oral TID  . metoCLOPramide (REGLAN) injection  10 mg Intravenous 4 times per day  . pantoprazole  40 mg Oral BID AC  . polyethylene glycol  17 g Oral BID  . protein supplement  1 packet Oral q morning - 10a  . scopolamine  1 patch Transdermal Q72H  . sennosides  5 mL Oral QHS  . sodium chloride  3 mL Intravenous Q12H   Continuous Infusions: . sodium chloride 50 mL/hr at 10/26/13 2139  . feeding supplement (OSMOLITE 1.5 CAL) 1,000 mL (10/23/13 1700)    Active Problems:   Essential hypertension   Gastric carcinoma s/p Distal gastrectomy/GJ (Billroth II) 09/20/2013   Intractable nausea and vomiting   Protein-calorie malnutrition, severe   Anemia, unspecified    Time spent: 30 minutes.    Vernell Leep, MD, FACP, FHM. [facility] Pager [phone]  If 7PM-7AM, please contact night-coverage [email or web address] Password [email or web address] 10/27/2013, 7:45 AM    LOS: 5 days

## 2013-10-27 NOTE — Procedures (Signed)
GJ tube exchange 18 Fr Tip in jejunum No comp

## 2013-10-28 DIAGNOSIS — K5909 Other constipation: Secondary | ICD-10-CM | POA: Diagnosis present

## 2013-10-28 DIAGNOSIS — F05 Delirium due to known physiological condition: Secondary | ICD-10-CM

## 2013-10-28 DIAGNOSIS — K3184 Gastroparesis: Secondary | ICD-10-CM | POA: Diagnosis present

## 2013-10-28 DIAGNOSIS — F450 Somatization disorder: Principal | ICD-10-CM

## 2013-10-28 DIAGNOSIS — F39 Unspecified mood [affective] disorder: Secondary | ICD-10-CM

## 2013-10-28 DIAGNOSIS — M79622 Pain in left upper arm: Secondary | ICD-10-CM | POA: Diagnosis present

## 2013-10-28 LAB — COMPREHENSIVE METABOLIC PANEL
ALBUMIN: 2.8 g/dL — AB (ref 3.5–5.2)
ALK PHOS: 87 U/L (ref 39–117)
ALT: 45 U/L — AB (ref 0–35)
AST: 27 U/L (ref 0–37)
Anion gap: 11 (ref 5–15)
BUN: 3 mg/dL — ABNORMAL LOW (ref 6–23)
CALCIUM: 8.5 mg/dL (ref 8.4–10.5)
CO2: 29 mEq/L (ref 19–32)
Chloride: 99 mEq/L (ref 96–112)
Creatinine, Ser: 0.74 mg/dL (ref 0.50–1.10)
GFR calc Af Amer: >90 mL/min (ref >90)
GFR calc non Af Amer: >90 mL/min (ref >90)
Glucose, Bld: 128 mg/dL — ABNORMAL HIGH (ref 70–99)
POTASSIUM: 3.4 meq/L — AB (ref 3.7–5.3)
SODIUM: 139 meq/L (ref 137–147)
TOTAL PROTEIN: 5.8 g/dL — AB (ref 6.0–8.3)
Total Bilirubin: 0.7 mg/dL (ref 0.3–1.2)

## 2013-10-28 LAB — GLUCOSE, CAPILLARY
GLUCOSE-CAPILLARY: 114 mg/dL — AB (ref 70–99)
GLUCOSE-CAPILLARY: 116 mg/dL — AB (ref 70–99)
GLUCOSE-CAPILLARY: 127 mg/dL — AB (ref 70–99)
Glucose-Capillary: 117 mg/dL — ABNORMAL HIGH (ref 70–99)
Glucose-Capillary: 123 mg/dL — ABNORMAL HIGH (ref 70–99)
Glucose-Capillary: 138 mg/dL — ABNORMAL HIGH (ref 70–99)

## 2013-10-28 LAB — AMMONIA: Ammonia: 23 umol/L (ref 11–60)

## 2013-10-28 LAB — CBC
HEMATOCRIT: 27.6 % — AB (ref 36.0–46.0)
Hemoglobin: 8.7 g/dL — ABNORMAL LOW (ref 12.0–15.0)
MCH: 25.4 pg — AB (ref 26.0–34.0)
MCHC: 31.5 g/dL (ref 30.0–36.0)
MCV: 80.5 fL (ref 78.0–100.0)
Platelets: 261 10*3/uL (ref 150–400)
RBC: 3.43 MIL/uL — AB (ref 3.87–5.11)
RDW: 15.2 % (ref 11.5–15.5)
WBC: 5.3 10*3/uL (ref 4.0–10.5)

## 2013-10-28 LAB — MAGNESIUM: MAGNESIUM: 1.5 mg/dL (ref 1.5–2.5)

## 2013-10-28 MED ORDER — POTASSIUM CHLORIDE 20 MEQ/15ML (10%) PO LIQD
40.0000 meq | Freq: Once | ORAL | Status: AC
  Administered 2013-10-28: 40 meq via ORAL
  Filled 2013-10-28: qty 30

## 2013-10-28 MED ORDER — OLANZAPINE 5 MG PO TBDP
5.0000 mg | ORAL_TABLET | Freq: Every day | ORAL | Status: DC
  Administered 2013-10-28 – 2013-11-04 (×7): 5 mg via ORAL
  Filled 2013-10-28 (×9): qty 1

## 2013-10-28 MED ORDER — POTASSIUM CL IN DEXTROSE 5% 20 MEQ/L IV SOLN
20.0000 meq | INTRAVENOUS | Status: DC
  Administered 2013-10-28: 20 meq via INTRAVENOUS
  Filled 2013-10-28 (×2): qty 1000

## 2013-10-28 MED ORDER — METOCLOPRAMIDE HCL 10 MG PO TABS
10.0000 mg | ORAL_TABLET | Freq: Three times a day (TID) | ORAL | Status: DC
  Administered 2013-10-28 – 2013-11-05 (×24): 10 mg via ORAL
  Filled 2013-10-28 (×37): qty 1

## 2013-10-28 MED ORDER — HYDROCODONE-ACETAMINOPHEN 5-325 MG PO TABS
0.5000 | ORAL_TABLET | Freq: Three times a day (TID) | ORAL | Status: DC | PRN
  Administered 2013-10-29 – 2013-10-30 (×2): 0.5 via ORAL
  Filled 2013-10-28 (×2): qty 1

## 2013-10-28 MED ORDER — ALUM & MAG HYDROXIDE-SIMETH 200-200-20 MG/5ML PO SUSP: 30.0000 mL | Freq: Four times a day (QID) | ORAL | Status: DC | PRN

## 2013-10-28 MED ORDER — OSMOLITE 1.5 CAL PO LIQD
1000.0000 mL | ORAL | Status: DC
  Filled 2013-10-28 (×3): qty 1000

## 2013-10-28 MED ORDER — ENSURE COMPLETE PO LIQD
237.0000 mL | Freq: Two times a day (BID) | ORAL | Status: DC
  Administered 2013-10-28 – 2013-10-30 (×3): 237 mL via ORAL

## 2013-10-28 MED ORDER — FLUOXETINE HCL 10 MG PO CAPS
10.0000 mg | ORAL_CAPSULE | Freq: Every day | ORAL | Status: DC
  Administered 2013-10-29 – 2013-11-04 (×4): 10 mg via ORAL
  Filled 2013-10-28 (×9): qty 1

## 2013-10-28 MED ORDER — LIP MEDEX EX OINT
1.0000 | TOPICAL_OINTMENT | Freq: Two times a day (BID) | CUTANEOUS | Status: DC
Start: 2013-10-28 — End: 2013-11-05
  Administered 2013-10-28 – 2013-11-04 (×16): 1 via TOPICAL
  Filled 2013-10-28 (×3): qty 7

## 2013-10-28 MED ORDER — HYDROMORPHONE HCL PF 1 MG/ML IJ SOLN
0.5000 mg | Freq: Three times a day (TID) | INTRAMUSCULAR | Status: DC | PRN
  Administered 2013-10-28 – 2013-10-30 (×4): 0.5 mg via INTRAVENOUS
  Filled 2013-10-28 (×5): qty 1

## 2013-10-28 MED ORDER — HYDROMORPHONE HCL PF 1 MG/ML IJ SOLN
0.5000 mg | INTRAMUSCULAR | Status: AC | PRN
  Administered 2013-10-28 – 2013-10-29 (×2): 0.5 mg via INTRAVENOUS
  Filled 2013-10-28: qty 1

## 2013-10-28 MED ORDER — OSMOLITE 1.5 CAL PO LIQD
1000.0000 mL | ORAL | Status: DC
  Filled 2013-10-28: qty 1000

## 2013-10-28 MED ORDER — MINERAL OIL PO OIL
960.0000 mL | TOPICAL_OIL | Freq: Once | ORAL | Status: AC
Start: 2013-10-28 — End: 2013-10-28
  Administered 2013-10-28: 960 mL via RECTAL
  Filled 2013-10-28: qty 240

## 2013-10-28 MED ORDER — LORAZEPAM 0.5 MG PO TABS
0.5000 mg | ORAL_TABLET | Freq: Three times a day (TID) | ORAL | Status: DC
  Administered 2013-10-28 – 2013-11-01 (×13): 0.5 mg via ORAL
  Filled 2013-10-28 (×14): qty 1

## 2013-10-28 MED ORDER — MAGIC MOUTHWASH
15.0000 mL | Freq: Four times a day (QID) | ORAL | Status: DC | PRN
  Filled 2013-10-28: qty 15

## 2013-10-28 MED ORDER — METOCLOPRAMIDE HCL 10 MG PO TABS: 10.0000 mg | ORAL_TABLET | Freq: Three times a day (TID) | ORAL | Status: DC

## 2013-10-28 MED ORDER — POTASSIUM CHLORIDE 10 MEQ/100ML IV SOLN
10.0000 meq | INTRAVENOUS | Status: DC
  Administered 2013-10-28: 10 meq via INTRAVENOUS
  Filled 2013-10-28 (×4): qty 100

## 2013-10-28 MED ORDER — NALOXONE HCL 0.4 MG/ML IJ SOLN: 0.4000 mg | INTRAMUSCULAR | Status: DC | PRN

## 2013-10-28 MED ORDER — LACTATED RINGERS IV BOLUS (SEPSIS): 1000.0000 mL | Freq: Three times a day (TID) | INTRAVENOUS | Status: AC | PRN

## 2013-10-28 NOTE — Progress Notes (Signed)
Patient complained of pain to left side, asking for iv dilaudid. Dilaudid was changed to every 8 hours today due to patient being lethargic/sedated. Patient much more alert compared with past two nights. On call MD paged and made aware of above. New order received for 2 doses of .5 mg IV dilaudid every 4 hours & narcan for over sedation. Patient aware of plan & will discuss with MD in the am about increasing the frequency of her iv dilaudid. Will monitor patient closely tonight for over sedation.

## 2013-10-28 NOTE — Progress Notes (Signed)
[facility]  [address] [place], [place] [address] Phone: [phone] FAX: [phone]    Paula [name] [id number] Dec 06, 1965  CARE TEAM:  PCP: Arnoldo Morale, MD  Outpatient Care Team: Patient Care Team: Arnoldo Morale, MD as PCP - General (Family Medicine) Curt Bears, MD as Consulting Physician (Oncology) [name], MD as Consulting Physician (Surgical Oncology)  Inpatient Treatment Team: Treatment Team: Attending Provider: Modena Jansky, MD; Technician: Ileene Rubens, NT; Rounding Team: Garner Gavel, MD; Technician: Lily Peer, NT; Registered Nurse: Suzan Slick, RN; Consulting Physician: Wonda Horner, MD; Consulting Physician: Nolon Nations, MD; Consulting Physician: [name], MD; Registered Nurse: Gwyndolyn Kaufman, RN; Registered Nurse: Betti Cruz, RN; Registered Nurse: Wilfred Curtis, RN   Subjective:  C/o sore swollen left arm, esp at old IV sites Less nausea but G-tube to gravity At 75mL/hr TFs  Objective:  Vital signs:  Filed Vitals:   10/26/13 2005 10/27/13 0520 10/27/13 1500 10/28/13 0420  BP: 113/71 110/68 115/71 115/67  Pulse: 84 90 91 99  Temp: 97.9 F (36.6 C) 98.9 F (37.2 C) 98.3 F (36.8 C) 98.9 F (37.2 C)  TempSrc: Oral Axillary Oral Oral  Resp: 16 16 16 18   Height:      Weight:    141 lb 6.4 oz (64.139 kg)  SpO2: 99% 100% 100% 100%    Last BM Date: 10/24/13  Intake/Output   Yesterday:  09/06 0701 - 09/07 0700 In: 1960 [P.O.:240; I.V.:1250; NG/GT:110] Out: 1150 [Drains:150] This shift:     Bowel function:  Flatus: y  BM: n  Drain: bilious in gravity bag G tube  Physical Exam:  General: Pt awake/alert/oriented x4 in no acute distress Eyes: PERRL, normal EOM.  Sclera clear.  No icterus Neuro: CN II-XII intact w/o focal sensory/motor deficits. Lymph: No head/neck/groin lymphadenopathy Psych:  No delerium/psychosis/paranoia HENT:  Normocephalic, Mucus membranes moist.  No thrush Neck: Supple, No tracheal deviation Chest: No chest wall pain w good excursion CV:  Pulses intact.  Regular rhythm MS: Normal AROM mjr joints.  No obvious deformity Abdomen: Soft.  Nondistended.  Mildly tender.  No evidence of peritonitis.  No incarcerated hernias. Ext:  SCDs BLE.  No mjr edema.  No cyanosis Skin: No petechiae / purpura   Problem List:   Active Problems:   Essential hypertension   Gastric carcinoma s/p Distal gastrectomy/GJ (Billroth II) 09/20/2013   Intractable nausea and vomiting   Protein-calorie malnutrition, severe   Anemia, unspecified   Pain of left upper arm   Gastric atony   Constipation, chronic   Assessment  Paula [name]  48 y.o. female       DATE OF SURGERY: 09/14/2013  PRE-OPERATIVE DIAGNOSIS: Gastric adenocarcinoma  POST-OPERATIVE DIAGNOSIS: Same  PROCEDURE: Procedure(s):  Diagnostic laparoscopy, distal gastrectomy with billroth II anastamosis and feeding gastrojejunostomy tube  SURGEON: Surgeon(s):  [name], MD        Persistent gastric ileus s/p partial gastrectomy  Plan:  -Clamp G tube trial -pureed diet for now -try reglan PO -inc TFs slowly -enemas to clear out barium from colon - needs bowel regimen -VTE prophylaxis- SCDs, etc -mobilize as tolerated to help recovery  Paula [name], M.D., F.A.C.S. Gastrointestinal and Minimally Invasive Surgery [facility] [address] [place], [place] [address] [phone] Main / Paging   10/28/2013   Results:   Labs: Results for orders placed during the hospital encounter of 10/22/13 (from  the past 48 hour(s))  GLUCOSE, CAPILLARY     Status: Abnormal   Collection Time    10/26/13 12:34 PM      Result Value Ref Range   Glucose-Capillary 145 (*) 70 - 99 mg/dL  GLUCOSE, CAPILLARY     Status: Abnormal   Collection Time    10/26/13  4:35 PM      Result Value Ref Range    Glucose-Capillary 108 (*) 70 - 99 mg/dL  GLUCOSE, CAPILLARY     Status: Abnormal   Collection Time    10/26/13  8:00 PM      Result Value Ref Range   Glucose-Capillary 109 (*) 70 - 99 mg/dL   Comment 1 Notify RN    GLUCOSE, CAPILLARY     Status: Abnormal   Collection Time    10/26/13 11:58 PM      Result Value Ref Range   Glucose-Capillary 110 (*) 70 - 99 mg/dL   Comment 1 Notify RN    GLUCOSE, CAPILLARY     Status: Abnormal   Collection Time    10/27/13  4:09 AM      Result Value Ref Range   Glucose-Capillary 106 (*) 70 - 99 mg/dL   Comment 1 Notify RN    GLUCOSE, CAPILLARY     Status: Abnormal   Collection Time    10/27/13  7:54 AM      Result Value Ref Range   Glucose-Capillary 102 (*) 70 - 99 mg/dL  GLUCOSE, CAPILLARY     Status: Abnormal   Collection Time    10/27/13 12:01 PM      Result Value Ref Range   Glucose-Capillary 101 (*) 70 - 99 mg/dL  GLUCOSE, CAPILLARY     Status: Abnormal   Collection Time    10/27/13  4:51 PM      Result Value Ref Range   Glucose-Capillary 109 (*) 70 - 99 mg/dL  GLUCOSE, CAPILLARY     Status: Abnormal   Collection Time    10/27/13  8:14 PM      Result Value Ref Range   Glucose-Capillary 105 (*) 70 - 99 mg/dL   Comment 1 Notify RN    GLUCOSE, CAPILLARY     Status: Abnormal   Collection Time    10/28/13 12:01 AM      Result Value Ref Range   Glucose-Capillary 116 (*) 70 - 99 mg/dL   Comment 1 Notify RN    GLUCOSE, CAPILLARY     Status: Abnormal   Collection Time    10/28/13  7:41 AM      Result Value Ref Range   Glucose-Capillary 123 (*) 70 - 99 mg/dL    Imaging / Studies: No results found.  Medications / Allergies: per chart  Antibiotics: Anti-infectives   None       Note: Portions of this report may have been transcribed using voice recognition software. Every effort was made to ensure accuracy; however, inadvertent computerized transcription errors may be present.   Any transcriptional errors that result from this  process are unintentional.

## 2013-10-28 NOTE — Progress Notes (Signed)
Per MD orders Pt. Was ordered 4 runs of IV potassium. Pt. Received 1/2 of the first IVPB and called RN into room and stated that the infusion was burning too bad and she wished to have order changed to PO potassium. MD was notified and new orders were placed for a one time dose of liquid potassium. Will continue to monitor.

## 2013-10-28 NOTE — Progress Notes (Signed)
I have asked patient several times to increase her osmolite to 78ml/hr to reach her goal rate. She has declined each time, stating she will agree to increase it in the am. She has been disoriented to time tonight, complaining of pain at her G-J tube site. She has also increased weakness, repeats questions, and needed to be redirected several times when being assisted to the bed side commode. Her speech is mumbled at times, and difficult to understand. Will check patient frequently during the night.

## 2013-10-28 NOTE — Progress Notes (Signed)
PROGRESS NOTE    Paula [name] NGE:[id number] DOB: 05-Jun-1965 DOA: 10/22/2013 PCP: Arnoldo Morale, MD Oncologist: Dr. Curt Bears  HPI/Brief narrative 48 year old female initially admitted 09/14/13 with one year history of nausea, vomiting and epigastric pain. EGD by Dr. 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 09/20/13, she underwent diagnostic laparoscopy, distal gastrectomy with Billroth II anastomosis and feeding GJ tube by Dr. Barry Dienes. UGI on 10/02/13 showed normal post gastrojejunostomy without perforation. Since then she has had problems with by mouth and tube feeding and has had multiple admissions for abdominal pain, nausea and vomiting. During one such admission, palliative consulted and patient wishes to continue full curative treatment. Dr. [name] saw patient on 7/25 and advised her that she has at least stage III gastric cancer and would definitely need discussion of adjuvant treatment including chemotherapy and XRT-during outpatient followup. She was readmitted with intractable nausea and vomiting. Despite extensive workup, she is still unable to consistently keep significant amount by mouth or via tube feeds. CCS following. Psychiatry consulted to R/O Psychogenic causes for her N&V.   Assessment/Plan:  1. Intractable nausea and vomiting: Unclear etiology. No mechanical causes for her symptoms based on extensive testing-UGI also negative. CT head with contrast showed no brain metastasis. Has been unable to consistently tolerate either by mouth or tube feeding. She had gone up to tube feed of 20 mL per hour a few days back and attempts to increase it to 30 mL per hour caused vomiting. GI was consulted and states that her stomach is probably volume sensitive & had no further recommendations. GJ tube was changed on 9/6 secondary to leak. Ativan was started but dose will be reduced secondary to  intermittent somnolence. Psychiatry consulted to rule out psychogenic etiology and have started her on Prozac and Zyprexa. Surgery continues to follow and have maximized Reglan, clamping G-tube daily, continue J-tube feeding and bowel regimen for constipation. Continue PPI. If patient unable to tolerate adequate tube/by mouth feeding to achieve adequate nutritional state, we'll need to go back on TNA.  2. Gastric cancer, at least stage III: Discussed with Dr. [name] on 9/2 who recommends outpatient followup when GI issues have settled for discussion regarding further cancer treatment. CT head negative for metastasis. Radiation oncology input appreciated. 3. Chronic Anemia: no reported bleeding. Hb stable. 4. Hypertension: Controlled. Continue carvedilol. 5. History of GERD: Continue PPI. 6. Severe malnutrition in the context of chronic illness: Await nutrition management pending GI evaluation as above. 7. Confusion: As per nursing, patient has been intermittently confused and somnolent at times over the last 24-36 hours. She did not receive any doses of Ativan yesterday. Minimize sedative medications-narcotics and Ativan. Requested psychiatric consultation. Monitor closely. Etiology unclear-ammonia normal. No clinical focus of infection. No significant metabolic abnormality. No focal deficits. 8. Hypokalemia: Replace and follow. Check magnesium  Code Status: Full Family Communication: None at bedside Disposition Plan: Home when medically stable   Consultants:  General surgery  Gastroenterology  Radiation oncology  Interventional radiology  Psychiatry  Procedures:  GJ tube was changed by IR on 9/6  Antibiotics:  None   Subjective: Patient had some nausea but denied vomiting over last 24 hours. Stated that she tolerated small amount of by mouth intake and tube feeding at 10 mL per hour. Flatus +. No BM. As per nursing, intermittent confusion over last 24-36 hours. Mild soreness at  feeding tube site.  Objective: Filed Vitals:   10/26/13 2005 10/27/13  0520 10/27/13 1500 10/28/13 0420  BP: 113/71 110/68 115/71 115/67  Pulse: 84 90 91 99  Temp: 97.9 F (36.6 C) 98.9 F (37.2 C) 98.3 F (36.8 C) 98.9 F (37.2 C)  TempSrc: Oral Axillary Oral Oral  Resp: 16 16 16 18   Height:      Weight:    64.139 kg (141 lb 6.4 oz)  SpO2: 99% 100% 100% 100%    Intake/Output Summary (Last 24 hours) at 10/28/13 1404 Last data filed at 10/28/13 1114  Gross per 24 hour  Intake   1435 ml  Output   2650 ml  Net  -1215 ml   Filed Weights   10/25/13 0705 10/26/13 0420 10/28/13 0420  Weight: 67.767 kg (149 lb 6.4 oz) 67.994 kg (149 lb 14.4 oz) 64.139 kg (141 lb 6.4 oz)     Exam:  General exam: Pleasant young female lying comfortably in bed. Respiratory system: Clear. No increased work of breathing. Cardiovascular system: S1 & S2 heard, RRR. No JVD, murmurs, gallops, clicks or pedal edema. Gastrointestinal system: Abdomen is nondistended, soft and nontender. Normal bowel sounds heard. GJ tube site without acute findings.  Central nervous system: Somnolent but easily arousable and oriented x3. No focal neurological deficits. Extremities: Symmetric 5 x 5 power.   Data Reviewed: Basic Metabolic Panel:  Recent Labs Lab 10/22/13 0153 10/22/13 0650 10/24/13 0508 10/28/13 0835  NA 136* 134* 137 139  K 4.0 4.3 3.9 3.4*  CL 95* 96 100 99  CO2 24 23 25 29   GLUCOSE 106* 103* 130* 128*  BUN 6 5* 4* 3*  CREATININE 0.74 0.68 0.80 0.74  CALCIUM 9.6 9.2 8.8 8.5  MG  --  1.6  --   --   PHOS  --  3.6  --   --    Liver Function Tests:  Recent Labs Lab 10/22/13 0153 10/22/13 0650 10/24/13 0508 10/28/13 0835  AST 32 26 16 27   ALT 72* 67* 44* 45*  ALKPHOS 139* 126* 97 87  BILITOT 0.7 0.6 0.3 0.7  PROT 8.2 7.4 6.1 5.8*  ALBUMIN 4.1 3.6 3.0* 2.8*   No results found for this basename: LIPASE, AMYLASE,  in the last 168 hours  Recent Labs Lab 10/28/13 0835  AMMONIA 23    CBC:  Recent Labs Lab 10/22/13 0650 10/24/13 0508 10/25/13 0523 10/28/13 0835  WBC 5.5 4.4 5.2 5.3  HGB 9.5* 8.5* 9.3* 8.7*  HCT 31.0* 26.4* 29.9* 27.6*  MCV 81.4 80.0 80.8 80.5  PLT 307 279 307 261   Cardiac Enzymes: No results found for this basename: CKTOTAL, CKMB, CKMBINDEX, TROPONINI,  in the last 168 hours BNP (last 3 results) No results found for this basename: PROBNP,  in the last 8760 hours CBG:  Recent Labs Lab 10/27/13 1651 10/27/13 2014 10/28/13 0001 10/28/13 0741 10/28/13 1220  GLUCAP 109* 105* 116* 123* 117*    No results found for this or any previous visit (from the past 240 hour(s)).     Studies: No results found.      Scheduled Meds: . carvedilol  6.25 mg Oral BID WC  . enoxaparin (LOVENOX) injection  40 mg Subcutaneous Q24H  . feeding supplement (ENSURE COMPLETE)  237 mL Oral BID BM  . FLUoxetine  10 mg Oral Daily  . lip balm  1 application Topical BID  . LORazepam  0.5 mg Oral TID  . metoCLOPramide  10 mg Oral TID AC & HS  . OLANZapine zydis  5 mg Oral QHS  .  pantoprazole  40 mg Oral BID AC  . polyethylene glycol  17 g Oral BID  . potassium chloride  10 mEq Intravenous Q1 Hr x 4  . scopolamine  1 patch Transdermal Q72H  . sodium chloride  3 mL Intravenous Q12H   Continuous Infusions: . sodium chloride 10 mL/hr at 10/28/13 1129  . feeding supplement (OSMOLITE 1.5 CAL) 1,000 mL (10/28/13 1015)    Active Problems:   Essential hypertension   Gastric carcinoma s/p Distal gastrectomy/GJ (Billroth II) 09/20/2013   Intractable nausea and vomiting   Protein-calorie malnutrition, severe   Anemia, unspecified   Pain of left upper arm   Gastric atony   Constipation, chronic    Time spent: 30 minutes.    Vernell Leep, MD, FACP, FHM. [facility] Pager [phone]  If 7PM-7AM, please contact night-coverage [email or web address] Password [email or web address] 10/28/2013, 2:04 PM    LOS: 6 days

## 2013-10-28 NOTE — Progress Notes (Signed)
Pt J-tube became clogged.  J-tube and G-tube have been flushed per orders as scheduled.  IR was notified but no one was available in the hospital due to the holiday. MD was notified of the situation. Will continue to monitor.
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
## 2013-10-28 NOTE — Progress Notes (Signed)
NUTRITION FOLLOW UP  Intervention:   -Per surgery: Osmolite 1.5 increased to 25 ml/hr per j port. Run for 24 hours and assess tolerance before further advancement This provides:  900 kcal, 38 gm protein, 492 ml free water daily.   -Pureed diet per MD -Chocolate Ensure Complete po over ice per patient preference (350 kcal, 13 gm protein per 8 oz container) -D/C Unjury as patient has not been taking consider adding back depending on patient preference. -Calorie count (48hour) -RD to follow  Nutrition Dx:   Inadequate oral intake related to inability to eat as evidenced by NPO status   Goal:   TF to meet >/= 90% of their estimated nutrition needs    Monitor:   TF tolerance, total protein/energy intake, labs, weights, diet order, GI profile  Assessment:   9/01: -Diet recall indicated pt consuming snack-like meals- several bites of cereal w/milk, juices, yogurt, softened proteins (chicken), and ice cream. Was not consuming Unjury supplement as pt did not know where to procure them. Reinforced [facility] outpatient to purchase them upon d/c as warranted  -Educated pt on minimizing intake of high fat foods during episodes of nausea. Would also benefit from limiting high sugar foods d/t recent gastrectomy surgery and increased risk of dumping syndrome  -Endorsed ongoing weight loss of 20-30 lbs since 08/2013 d/t frequent hospitalizations and sub-optimal nutrition  -Pt currently NPO and bowel rest d/t ongoing nausea/vomiting. Consider nutrition support as pt unlikley able to sustain nutritional status via PO intake. Has GJ tube, pt has trialed tube feeding during previous admits; however pt was unable to tolerate and experienced nausea and vomiting even at low rate levels  -Pt with constipation; has not had BM since 8/29  9/02: -UGI completed, MD noted some reflux; however no post-operative anatomic findings to explain nausea or emesis -Pt with some nausea, but no emesis since 8/31 -GJ tube placement  confirmed, plan to initiate TF via GJ tube and advance PO diet as tolerated -Last BM on 8/29, passing flatus -Phos/Mg/K WNL, monitor for refeeding risk d/t prolonged sub-optimal nutrition intake and weight loss. Has hx of TF intolerance, will start at low dose and opt for higher calorically dense formula.  9/03: -Osmolite 1.5 initiated at 10 ml/hr, tolerated advancement to 20 ml/hr. Pt w/feelings of nausea and emesis when TF at 30 ml/hr -Surgery recommends to run TF at 20 ml/hr for 24 hours, and reassess advancement/tolerance. Will increase Pro-Stat to TID for additional protein while TF running at low rate -Diet advanced to clear liquids. Will order Unjury Chicken Broth once daily to provide more nutrient dense option  9/7: -G-J tube replaced as tube was clogged, orders for nothing but feeding to be put through j-port. -Osmolite 1.5 increased to 25 ml/hr per J-port. -Patient reports no nausea but G port to gravity. -Ate 1/4 grits and 1/8 applesauce this am. -Has not been getting Unjury and wants Ensure over ice which she likes. -Gastric cancer at least stage III noted. -Calorie count started per MD -Patient taking meds po or per g-tube.  Plus po food.  Are these being absorbed with G tube to gravity drain with high output?  Height: Ht Readings from Last 1 Encounters:  10/22/13 5' (1.524 m)    Weight Status:   Wt Readings from Last 1 Encounters:  10/28/13 141 lb 6.4 oz (64.139 kg)    Re-estimated needs:  Kcal: 1800-2000  Protein: 85-95 gram  Fluid: >/=1800 ml/daily   Skin: WDL  Diet Order: Dysphagia 1 thin  Intake/Output Summary (Last 24 hours) at 10/28/13 1234 Last data filed at 10/28/13 1114  Gross per 24 hour  Intake   1435 ml  Output   2650 ml  Net  -1215 ml    Last BM: 8/29   Labs:   Recent Labs Lab 10/22/13 0650 10/24/13 0508 10/28/13 0835  NA 134* 137 139  K 4.3 3.9 3.4*  CL 96 100 99  CO2 23 25 29   BUN 5* 4* 3*  CREATININE 0.68 0.80 0.74   CALCIUM 9.2 8.8 8.5  MG 1.6  --   --   PHOS 3.6  --   --   GLUCOSE 103* 130* 128*    CBG (last 3)   Recent Labs  10/28/13 0001 10/28/13 0741 10/28/13 1220  GLUCAP 116* 123* 117*    Scheduled Meds: . carvedilol  6.25 mg Oral BID WC  . enoxaparin (LOVENOX) injection  40 mg Subcutaneous Q24H  . feeding supplement (ENSURE COMPLETE)  237 mL Oral BID BM  . FLUoxetine  10 mg Oral Daily  . lip balm  1 application Topical BID  . LORazepam  0.5 mg Oral TID  . metoCLOPramide  10 mg Oral TID AC & HS  . OLANZapine zydis  5 mg Oral QHS  . pantoprazole  40 mg Oral BID AC  . polyethylene glycol  17 g Oral BID  . protein supplement  1 packet Oral q morning - 10a  . scopolamine  1 patch Transdermal Q72H  . sodium chloride  3 mL Intravenous Q12H    Continuous Infusions: . sodium chloride 10 mL/hr at 10/28/13 1129  . feeding supplement (OSMOLITE 1.5 CAL) 1,000 mL (10/28/13 1015)    Antonieta Iba, RD, LDN Clinical Inpatient Dietitian Pager:  [phone] Weekend and after hours pager:  [phone]
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
## 2013-10-29 LAB — GLUCOSE, CAPILLARY
GLUCOSE-CAPILLARY: 138 mg/dL — AB (ref 70–99)
Glucose-Capillary: 138 mg/dL — ABNORMAL HIGH (ref 70–99)
Glucose-Capillary: 131 mg/dL — ABNORMAL HIGH (ref 70–99)

## 2013-10-29 LAB — BASIC METABOLIC PANEL
ANION GAP: 9 (ref 5–15)
BUN: 3 mg/dL — ABNORMAL LOW (ref 6–23)
CO2: 28 mEq/L (ref 19–32)
Calcium: 8.7 mg/dL (ref 8.4–10.5)
Chloride: 101 mEq/L (ref 96–112)
Creatinine, Ser: 0.78 mg/dL (ref 0.50–1.10)
GFR calc Af Amer: >90 mL/min (ref >90)
Glucose, Bld: 132 mg/dL — ABNORMAL HIGH (ref 70–99)
Potassium: 3.5 mEq/L — ABNORMAL LOW (ref 3.7–5.3)
SODIUM: 138 meq/L (ref 137–147)

## 2013-10-29 MED ORDER — POTASSIUM CHLORIDE 20 MEQ/15ML (10%) PO LIQD
40.0000 meq | Freq: Once | ORAL | Status: AC
  Administered 2013-10-29: 40 meq via ORAL
  Filled 2013-10-29: qty 30

## 2013-10-29 NOTE — Progress Notes (Signed)
Patient ID: Paula [name], female   DOB: 02-14-1966, 48 y.o.   MRN: [id number]    Subjective: States she had a much better day yesterday.  Tolerated soft diet better (though g tube on drainage but pt reports not much drainage).  Pain is better  Objective: Vital signs in last 24 hours: Temp:  [97.8 F (36.6 C)-98.9 F (37.2 C)] 97.8 F (36.6 C) (09/08 0604) Pulse Rate:  [88-92] 88 (09/08 0604) Resp:  [16] 16 (09/08 0604) BP: (113-117)/(70-78) 115/70 mmHg (09/08 0604) SpO2:  [96 %-100 %] 100 % (09/08 0604) Last BM Date: 10/28/13  Intake/Output from previous day: 09/07 0701 - 09/08 0700 In: 660.8 [P.O.:360; I.V.:300.8] Out: 675 [Drains:675] Intake/Output this shift:    General appearance: alert, cooperative and no distress GI: normal findings: soft, non-tender Incision/Wound:G-J tube site clean  Lab Results:   Recent Labs  10/28/13 0835  WBC 5.3  HGB 8.7*  HCT 27.6*  PLT 261   BMET  Recent Labs  10/28/13 0835 10/29/13 0350  NA 139 138  K 3.4* 3.5*  CL 99 101  CO2 29 28  GLUCOSE 128* 132*  BUN 3* 3*  CREATININE 0.74 0.78  CALCIUM 8.5 8.7     Studies/Results: Ir Replc Gastro/colonic Tube Percut W/fluoro  10/29/2013   CLINICAL DATA:  Occluded gastrojejunostomy tube  EXAM: GASTROSTOMY CATHETER REPLACEMENT  FLUOROSCOPY TIME:  42 seconds.  MEDICATIONS AND MEDICAL HISTORY: None  ANESTHESIA/SEDATION: Moderate sedation time: 9 minutes  CONTRAST:  10 cc Omnipaque 300  PROCEDURE: The existing gastrojejunostomy tube was exchanged over a stiff glidewire for a new 18 French gastrojejunostomy tube. The balloon was insufflated with 10 cc saline. Contrast was injected.  FINDINGS: The jejunostomy tube portion traverses through a Billroth anastomosis and the tip is in the jejunum.  COMPLICATIONS: None  IMPRESSION: Successful gastrojejunostomy tube exchange.   Electronically Signed   By: Maryclare Bean M.D.   On: 10/29/2013 08:41    Anti-infectives: Anti-infectives   None       Assessment/Plan: S/P gastrectomy with BII.  Persistent gastric atony/poor emptying.  UGI neg on 9/2 Seems a little better again today.  Try clamping g tube and continue diet.     LOS: 7 days    Allora Bains T 10/29/2013

## 2013-10-29 NOTE — Progress Notes (Signed)
Calorie Count Note  48 hour calorie count ordered.  Diet: Dys1/Puree Supplements: Ensure Complete once daily Tube Feeding: Osmolite 1.5 at 25 ml/hr - held  Estimated needs:  Kcal: 1800-2000  Protein: 85-95 gram  Fluid: >/=1800 ml/daily  Breakfast: 35 kcal, 1 gram protein Lunch: 0 Dinner: 180 kcal, 7 gram protein Supplements: 350 kcal, 13 gram protein  Total intake: 565 kcal (31% of minimum estimated needs)  21 gram protein (25% of minimum estimated needs)  TF at goal rate (Osmolite 1.5 at 25 ml/hr) + PO intake at current amount would provide 1465 kcal (81% est kcal needs), 59 gram protein (69% est protein needs)  Nutrition Dx: Inadequate oral intake related to inability to eat as evidenced by NPO status ; now r/t nausea/abd pain as evidenced by PO intake >< 75%  Goal: TF + Pt to meet >/= 90% of their estimated nutrition needs; progressing  Assessment: -RN reported clogged GJtube on 9/07, TF was held. GI evaluated tube on 9/08, noted no obstruction and recommended to resume TF -RN plan to resume TF per pt tolerance; recommended Osmolite 1.5 at 25 ml/hr to provide 900 kcal, 38 gm protein, 492 ml free water daily  Intervention:  -Continue 48 hour Calorie Count -Restart Osmolite 1.5 at 25 ml/hr via Jtube as tolerated -Ensure Complete once daily -Provided pt with personalized menu to assist with meal ordering -Encourage high kcal/protein foods   Atlee Abide MS RD LDN Clinical Dietitian [phone]

## 2013-10-29 NOTE — Progress Notes (Signed)
IR asked to eval GJ tube for possible clogging again. Was just exchanged on 9/6 with fluoro guidance/contrast injection confirmation. Pt reports pain at site with manipulation of tube.  BP 115/70  Pulse 88  Temp(Src) 97.8 F (36.6 C) (Oral)  Resp 16  Ht 5' (1.524 m)  Wt 141 lb 6.4 oz (64.139 kg)  BMI 27.62 kg/m2  SpO2 100%  LMP 09/12/2013 Tube site looks good.  No leak, no swelling. Pain with manipulation. Both G-limb and J-limb flushed easily with NS. 3 cc removed from retention balloon, now with only 7 cc in. Some improvement in pain.  No apparent obstruction/clog, can resume TF. If persistent pain/issues, will need to bring back down for injection/fluoro eval.   Ascencion Dike PA-C Interventional Radiology 10/29/2013 10:41 AM

## 2013-10-29 NOTE — Progress Notes (Signed)
Clinical Social Work  CSW went to follow up with patient. Patient was walking in the hallways but reports that she would prefer to go and sit in her room now to rest. Patient and CSW spoke about her emotional wellbeing. Patient told CSW that she is able to eat soft foods and is starting to feel better. Patient reports that she is hopeful to go home soon if she does not get sick again. Patient reports she has been compliant with medications that psychiatrist has prescribed but feels that the Zoloft is too strong and makes her feel drowsy and confused. Patient reports she took Zyprexa last night and it helped her sleep and she feels that medication will be beneficial.  Patient spoke with CSW about her family and reports her children have been checking in on her and told CSW about her grandchildren. Patient reports she wants to get better so that she can get back to her family. Patient feels she could start working again and going back to school in order to get on schedule as well.  CSW will continue to follow for support during hospital stay.  [place], [place] [phone]

## 2013-10-29 NOTE — Progress Notes (Signed)
PROGRESS NOTE    Paula [name] RDE:[id number] DOB: [birth year] DOA: 10/22/2013 PCP: Arnoldo Morale, MD Oncologist: Dr. Curt Bears  HPI/Brief narrative 48 year old female initially admitted 09/14/13 with one year history of nausea, vomiting and epigastric pain. EGD by Dr. 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 09/20/13, she underwent diagnostic laparoscopy, distal gastrectomy with Billroth II anastomosis and feeding GJ tube by Dr. Barry Dienes. UGI on 10/02/13 showed normal post gastrojejunostomy without perforation. Since then she has had problems with by mouth and tube feeding and has had multiple admissions for abdominal pain, nausea and vomiting. During one such admission, palliative consulted and patient wishes to continue full curative treatment. Dr. [name] saw patient on 7/25 and advised her that she has at least stage III gastric cancer and would definitely need discussion of adjuvant treatment including chemotherapy and XRT-during outpatient followup. She was readmitted with intractable nausea and vomiting. Despite extensive workup, she is still unable to consistently keep significant amount by mouth or via tube feeds. CCS following. Psychiatry consulted to R/O Psychogenic causes for her N&V. finally starting to tolerate oral intake better-if continues, possible discharge in the next 2-3 days.   Assessment/Plan:  1. Intractable nausea and vomiting: Unclear etiology. No mechanical causes for her symptoms based on extensive testing-UGI also negative. CT head with contrast showed no brain metastasis. Has been unable to consistently tolerate either by mouth or tube feeding. She had gone up to tube feed of 20 mL per hour a few days back and attempts to increase it to 30 mL per hour caused vomiting. GI was consulted and states that her stomach is probably volume sensitive & had no further recommendations. GJ  tube was changed on 9/6 secondary to leak. Ativan was started but dose will be reduced secondary to intermittent somnolence. Psychiatry consulted to rule out psychogenic etiology and have started her on Prozac and Zyprexa. Surgery continues to follow and have maximized Reglan, clamping G-tube daily, continue J-tube feeding and bowel regimen for constipation. Continue PPI. If patient unable to tolerate adequate tube/by mouth feeding to achieve adequate nutritional state, we'll need to go back on TNA. Patient seems to be tolerating soft food-continue to monitor. If able to tolerate by mouth consistently, may be able to DC home in a couple of days. 2. Gastric cancer, at least stage III: Discussed with Dr. [name] on 9/2 who recommends outpatient followup when GI issues have settled for discussion regarding further cancer treatment. CT head negative for metastasis. Radiation oncology input appreciated. 3. Chronic Anemia: no reported bleeding. Hb stable. 4. Hypertension: Controlled. Continue carvedilol. 5. History of GERD: Continue PPI. 6. Severe malnutrition in the context of chronic illness: Await nutrition management pending GI evaluation as above. 7. Confusion: As per nursing, patient has been intermittently confused and somnolent at times over the last 24-36 hours. She did not receive any doses of Ativan yesterday. Minimize sedative medications-narcotics and Ativan. Requested psychiatric consultation. Monitor closely. Etiology unclear-ammonia normal. No clinical focus of infection. No significant metabolic abnormality. No focal deficits. Seems to have resolved. 8. Hypokalemia: Replace and follow. Magnesium: 1.5.  Code Status: Full Family Communication: None at bedside Disposition Plan: Home when medically stable   Consultants:  General surgery  Gastroenterology  Radiation oncology  Interventional radiology  Psychiatry  Procedures:  GJ tube was changed by IR on 9/6  Antibiotics:  None     Subjective: Tolerating soft diet by mouth. Less drainage through gastrostomy tube. J-tube  block this morning. Issues with pain at tube site last night and was requesting increasing pain medications.  Objective: Filed Vitals:   10/28/13 1427 10/28/13 2329 10/29/13 0604 10/29/13 1457  BP: 113/72 117/78 115/70 106/74  Pulse: 91 92 88 95  Temp: 97.9 F (36.6 C) 98.9 F (37.2 C) 97.8 F (36.6 C) 97.3 F (36.3 C)  TempSrc: Oral Oral Oral Oral  Resp: 16 16 16 16   Height:      Weight:      SpO2: 96% 100% 100% 100%    Intake/Output Summary (Last 24 hours) at 10/29/13 1613 Last data filed at 10/29/13 0615  Gross per 24 hour  Intake 540.83 ml  Output     75 ml  Net 465.83 ml   Filed Weights   10/25/13 0705 10/26/13 0420 10/28/13 0420  Weight: 67.767 kg (149 lb 6.4 oz) 67.994 kg (149 lb 14.4 oz) 64.139 kg (141 lb 6.4 oz)     Exam:  General exam: Pleasant young female lying comfortably in bed. Respiratory system: Clear. No increased work of breathing. Cardiovascular system: S1 & S2 heard, RRR. No JVD, murmurs, gallops, clicks or pedal edema. Gastrointestinal system: Abdomen is nondistended, soft and nontender. Normal bowel sounds heard. GJ tube site without acute findings.  Central nervous system: Alert and oriented x3. No focal neurological deficits. Extremities: Symmetric 5 x 5 power.   Data Reviewed: Basic Metabolic Panel:  Recent Labs Lab 10/24/13 0508 10/28/13 0835 10/28/13 0840 10/29/13 0350  NA 137 139  --  138  K 3.9 3.4*  --  3.5*  CL 100 99  --  101  CO2 25 29  --  28  GLUCOSE 130* 128*  --  132*  BUN 4* 3*  --  3*  CREATININE 0.80 0.74  --  0.78  CALCIUM 8.8 8.5  --  8.7  MG  --   --  1.5  --    Liver Function Tests:  Recent Labs Lab 10/24/13 0508 10/28/13 0835  AST 16 27  ALT 44* 45*  ALKPHOS 97 87  BILITOT 0.3 0.7  PROT 6.1 5.8*  ALBUMIN 3.0* 2.8*   No results found for this basename: LIPASE, AMYLASE,  in the last 168 hours  Recent  Labs Lab 10/28/13 0835  AMMONIA 23   CBC:  Recent Labs Lab 10/24/13 0508 10/25/13 0523 10/28/13 0835  WBC 4.4 5.2 5.3  HGB 8.5* 9.3* 8.7*  HCT 26.4* 29.9* 27.6*  MCV 80.0 80.8 80.5  PLT 279 307 261   Cardiac Enzymes: No results found for this basename: CKTOTAL, CKMB, CKMBINDEX, TROPONINI,  in the last 168 hours BNP (last 3 results) No results found for this basename: PROBNP,  in the last 8760 hours CBG:  Recent Labs Lab 10/28/13 2022 10/28/13 2346 10/29/13 0607 10/29/13 0748 10/29/13 1226  GLUCAP 127* 138* 138* 131* 138*    No results found for this or any previous visit (from the past 240 hour(s)).     Studies: No results found.      Scheduled Meds: . carvedilol  6.25 mg Oral BID WC  . enoxaparin (LOVENOX) injection  40 mg Subcutaneous Q24H  . feeding supplement (ENSURE COMPLETE)  237 mL Oral BID BM  . FLUoxetine  10 mg Oral Daily  . lip balm  1 application Topical BID  . LORazepam  0.5 mg Oral TID  . metoCLOPramide  10 mg Oral TID AC & HS  . OLANZapine zydis  5 mg Oral QHS  . pantoprazole  40 mg Oral BID AC  . polyethylene glycol  17 g Oral BID  . scopolamine  1 patch Transdermal Q72H  . sodium chloride  3 mL Intravenous Q12H   Continuous Infusions: . sodium chloride Stopped (10/28/13 1528)  . dextrose 5 % with KCl 20 mEq / L 20 mEq (10/28/13 1528)  . feeding supplement (OSMOLITE 1.5 CAL) Stopped (10/28/13 1433)    Active Problems:   Essential hypertension   Gastric carcinoma s/p Distal gastrectomy/GJ (Billroth II) 09/20/2013   Intractable nausea and vomiting   Protein-calorie malnutrition, severe   Anemia, unspecified   Pain of left upper arm   Gastric atony   Constipation, chronic    Time spent: 30 minutes.    Vernell Leep, MD, FACP, FHM. [facility] Pager [phone]  If 7PM-7AM, please contact night-coverage [email or web address] Password [email or web address] 10/29/2013, 4:13 PM    LOS: 7 days

## 2013-10-30 DIAGNOSIS — K3184 Gastroparesis: Secondary | ICD-10-CM

## 2013-10-30 LAB — GLUCOSE, CAPILLARY
GLUCOSE-CAPILLARY: 109 mg/dL — AB (ref 70–99)
GLUCOSE-CAPILLARY: 113 mg/dL — AB (ref 70–99)
Glucose-Capillary: 105 mg/dL — ABNORMAL HIGH (ref 70–99)
Glucose-Capillary: 105 mg/dL — ABNORMAL HIGH (ref 70–99)
Glucose-Capillary: 111 mg/dL — ABNORMAL HIGH (ref 70–99)
Glucose-Capillary: 112 mg/dL — ABNORMAL HIGH (ref 70–99)
Glucose-Capillary: 113 mg/dL — ABNORMAL HIGH (ref 70–99)
Glucose-Capillary: 121 mg/dL — ABNORMAL HIGH (ref 70–99)
Glucose-Capillary: 139 mg/dL — ABNORMAL HIGH (ref 70–99)
Glucose-Capillary: 96 mg/dL (ref 70–99)

## 2013-10-30 LAB — BASIC METABOLIC PANEL
Anion gap: 8 (ref 5–15)
BUN: 3 mg/dL — ABNORMAL LOW (ref 6–23)
CALCIUM: 8.9 mg/dL (ref 8.4–10.5)
CO2: 30 mEq/L (ref 19–32)
Chloride: 100 mEq/L (ref 96–112)
Creatinine, Ser: 0.83 mg/dL (ref 0.50–1.10)
GFR, EST NON AFRICAN AMERICAN: 83 mL/min — AB (ref >90)
Glucose, Bld: 108 mg/dL — ABNORMAL HIGH (ref 70–99)
Potassium: 3.7 mEq/L (ref 3.7–5.3)
SODIUM: 138 meq/L (ref 137–147)

## 2013-10-30 LAB — CBC
HCT: 26.4 % — ABNORMAL LOW (ref 36.0–46.0)
Hemoglobin: 8.3 g/dL — ABNORMAL LOW (ref 12.0–15.0)
MCH: 24.9 pg — ABNORMAL LOW (ref 26.0–34.0)
MCHC: 31.4 g/dL (ref 30.0–36.0)
MCV: 79.3 fL (ref 78.0–100.0)
PLATELETS: 263 10*3/uL (ref 150–400)
RBC: 3.33 MIL/uL — ABNORMAL LOW (ref 3.87–5.11)
RDW: 15.3 % (ref 11.5–15.5)
WBC: 4 10*3/uL (ref 4.0–10.5)

## 2013-10-30 LAB — MAGNESIUM: MAGNESIUM: 1.6 mg/dL (ref 1.5–2.5)

## 2013-10-30 MED ORDER — KETOROLAC TROMETHAMINE 30 MG/ML IJ SOLN: 30.0000 mg | Freq: Four times a day (QID) | INTRAMUSCULAR | Status: DC | PRN

## 2013-10-30 MED ORDER — HYDROCODONE-ACETAMINOPHEN 5-325 MG PO TABS
1.0000 | ORAL_TABLET | Freq: Three times a day (TID) | ORAL | Status: DC | PRN
  Administered 2013-10-30 – 2013-10-31 (×3): 2 via ORAL
  Filled 2013-10-30 (×3): qty 2

## 2013-10-30 MED ORDER — KETOROLAC TROMETHAMINE 30 MG/ML IJ SOLN
30.0000 mg | Freq: Four times a day (QID) | INTRAMUSCULAR | Status: DC | PRN
  Administered 2013-10-30: 30 mg via INTRAVENOUS
  Filled 2013-10-30: qty 1

## 2013-10-30 MED ORDER — HYDROMORPHONE HCL PF 1 MG/ML IJ SOLN
0.5000 mg | INTRAMUSCULAR | Status: DC | PRN
  Administered 2013-10-30: 0.5 mg via INTRAVENOUS
  Filled 2013-10-30: qty 1

## 2013-10-30 MED ORDER — HYDROMORPHONE HCL PF 1 MG/ML IJ SOLN
0.5000 mg | INTRAMUSCULAR | Status: DC | PRN
  Administered 2013-10-30 – 2013-11-01 (×8): 0.5 mg via INTRAVENOUS
  Filled 2013-10-30 (×9): qty 1

## 2013-10-30 NOTE — Progress Notes (Addendum)
Progress Note   Paula [name] CHE:[id number] DOB: 06-Jan-1966 DOA: 10/22/2013 PCP: Arnoldo Morale, MD   Brief Narrative:   Paula [name] is an 48 y.o. female initially admitted 09/14/13 with one year history of nausea, vomiting and epigastric pain. EGD by Dr. 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 On 09/20/13, she underwent diagnostic laparoscopy, distal gastrectomy with Billroth II anastomosis and feeding GJ tube by Dr. Barry Dienes. UGI on 10/02/13 showed normal post gastrojejunostomy without perforation. Since then she has had problems with by mouth and tube feeding and has had multiple admissions for abdominal pain, nausea and vomiting. During one such admission, palliative consulted and patient wished to continue full curative treatment. Dr. [name] saw patient on 09/14/13 and advised her that she has at least stage III gastric cancer and would definitely need discussion of adjuvant treatment including chemotherapy and XRT-during outpatient followup. She was readmitted 10/22/13 with intractable nausea and vomiting. Despite extensive workup, she is still unable to consistently keep significant amount by mouth or via tube feeds. CCS following. 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   No mechanical causes for her symptoms based on extensive testing-UGI also negative.   Has been unable to consistently tolerate either by mouth or tube feeding.    GI was consulted and states that her stomach is probably volume sensitive & had no further recommendations. GJ tube was changed on 10/27/13 secondary to leak.   Psychiatry consulted to evaluate for psychogenic etiology and she was started on Prozac and Zyprexa.    Surgery continues to follow and have maximized Reglan, clamping G-tube daily, continue J-tube feeding and bowel regimen for constipation.   Continue PPI. If patient unable to tolerate adequate tube/by mouth feeding to achieve adequate nutritional state, she will need to go back on TNA. Patient seems to be tolerating soft food-continue to monitor.  Active Problems: Gastric cancer, at least stage III  Discussed with Dr. [name] on 10/23/13 who recommends outpatient followup when GI issues have settled for discussion regarding further cancer treatment.   CT head negative for metastasis.   Radiation oncology input appreciated, 5 weeks of radiation planned once symptoms adequately controlled.   Chronic Anemia  No reported bleeding. Hb stable.   Hypertension  Controlled. Continue carvedilol.   History of GERD  Continue PPI.   Severe malnutrition in the context of chronic illness  Seen by dietitian 10/29/13 with recommendations to resume tube feedings at 25 mL/hour as tolerated.   Confusion  As per nursing, patient has been intermittently confused and somnolent at times.  No evidence of brain metastasis on head CT.  Minimize sedative medications-narcotics and Ativan.   Hypokalemia  Replaced.  DVT Prophylaxis  Continue Lovenox.  Code Status: Full. Family Communication: No family at the bedside. Disposition Plan: Home when stable.   IV Access:    Peripheral IV   Procedures and diagnostic studies:    GJ tube was changed by IR on 10/27/13.   Medical Consultants:    Dr. Excell Seltzer, General surgery   Dr. Anson Fret, Gastroenterology   Dr. Eppie Gibson, Radiation oncology   Interventional radiology   Dr. Zorita Pang, Psychiatry    Other Consultants:    Dietitian   Anti-Infectives:    None.  Subjective:   Paula [name] tells me she  is having level 8/10 abdominal pain and is requesting higher dose Dilaudid-HP for pain control.  She tells me Toradol does not work well for her and seems to be resistant to alternative pain management strategies. She is spitting up saliva. No frank vomiting. Seems sedated. Passing flatus.  Objective:    Filed Vitals:   10/29/13 0604 10/29/13 1457 10/29/13 2134 10/30/13 0611  BP: 115/70 106/74 108/70 110/60  Pulse: 88 95 77 85  Temp: 97.8 F (36.6 C) 97.3 F (36.3 C) 98 F (36.7 C) 97.6 F (36.4 C)  TempSrc: Oral Oral Oral Oral  Resp: 16 16 16 16   Height:      Weight:      SpO2: 100% 100% 100% 100%    Intake/Output Summary (Last 24 hours) at 10/30/13 0813 Last data filed at 10/29/13 1457  Gross per 24 hour  Intake    120 ml  Output      0 ml  Net    120 ml    Exam: Gen:  Sedated Cardiovascular:  RRR, No M/R/G Respiratory:  Lungs CTAB Gastrointestinal:  Abdomen soft, tender with positive bowel sounds, J-tube intact Extremities:  No C/E/C   Data Reviewed:    Labs: Basic Metabolic Panel:  Recent Labs Lab 10/24/13 0508 10/28/13 0835 10/28/13 0840 10/29/13 0350 10/30/13 0400  NA 137 139  --  138 138  K 3.9 3.4*  --  3.5* 3.7  CL 100 99  --  101 100  CO2 25 29  --  28 30  GLUCOSE 130* 128*  --  132* 108*  BUN 4* 3*  --  3* 3*  CREATININE 0.80 0.74  --  0.78 0.83  CALCIUM 8.8 8.5  --  8.7 8.9  MG  --   --  1.5  --  1.6   GFR Estimated Creatinine Clearance: 70.7 ml/min (by C-G formula based on Cr of 0.83). Liver Function Tests:  Recent Labs Lab 10/24/13 0508 10/28/13 0835  AST 16 27  ALT 44* 45*  ALKPHOS 97 87  BILITOT 0.3 0.7  PROT 6.1 5.8*  ALBUMIN 3.0* 2.8*    Recent Labs Lab 10/28/13 0835  AMMONIA 23   CBC:  Recent Labs Lab 10/24/13 0508 10/25/13 0523 10/28/13 0835 10/30/13 0400  WBC 4.4 5.2 5.3 4.0  HGB 8.5* 9.3* 8.7* 8.3*  HCT 26.4* 29.9* 27.6* 26.4*  MCV 80.0 80.8 80.5 79.3  PLT 279 307 261 263   CBG:  Recent Labs Lab 10/29/13 1648 10/29/13 2010 10/29/13 2348 10/30/13 0410 10/30/13 0754  GLUCAP 139* 109* 111*  105* 96   Microbiology No results found for this or any previous visit (from the past 240 hour(s)).   Medications:   . carvedilol  6.25 mg Oral BID WC  . enoxaparin (LOVENOX) injection  40 mg Subcutaneous Q24H  . feeding supplement (ENSURE COMPLETE)  237 mL Oral BID BM  . FLUoxetine  10 mg Oral Daily  . lip balm  1 application Topical BID  . LORazepam  0.5 mg Oral TID  . metoCLOPramide  10 mg Oral TID AC & HS  . OLANZapine zydis  5 mg Oral QHS  . pantoprazole  40 mg Oral BID AC  . polyethylene glycol  17 g Oral BID  . scopolamine  1 patch Transdermal Q72H  . sodium chloride  3 mL Intravenous Q12H   Continuous Infusions: . sodium chloride Stopped (10/28/13 1528)  . feeding supplement (OSMOLITE 1.5 CAL) Stopped (10/28/13 1433)    Time spent:  25 minutes.   LOS: 8 days   [facility] Pager [phone]. If unable to reach me by pager, please call my cell phone at [phone].  *Please refer to [email or web address], password [email or web address] to get updated schedule on who will round on this patient, as hospitalists switch teams weekly. If 7PM-7AM, please contact night-coverage at [email or web address], password [email or web address] for any overnight needs.  10/30/2013, 8:13 AM

## 2013-10-30 NOTE — Progress Notes (Signed)
Patient interviewed and examined, agree with PA note above.  Edward Jolly MD, FACS  10/30/2013 5:24 PM

## 2013-10-30 NOTE — Progress Notes (Signed)
Met with patient per RN request. Patient is a Company secretary. When chaplain offered support, patient requested prayer. Prayed per her request. Patient was in a lot of pain at time of visit. Notified RN.   Epifania Gore, PhD, [place]

## 2013-10-30 NOTE — Progress Notes (Signed)
Drainage bag attached to g-tube. Paula [name]

## 2013-10-30 NOTE — Progress Notes (Signed)
Calorie Count Note  48 hour calorie count ordered- Day 2  Diet: Dys1/Puree Supplements: Ensure Complete BID Tube Feeding: Osmolite 1.5 at 25 ml/hr - D/C'd   Estimated needs:  Kcal: 1800-2000  Protein: 85-95 gram  Fluid: >/=1800 ml/daily   Breakfast: 0% Lunch: 53 kcal, 2 gram protein Dinner: 0% Supplements: Refused  Total intake: 53 kcal (3% of minimum estimated needs)  2gram protein (2% of minimum estimated needs)  Nutrition Dx: Inadequate oral intake related to inability to eat as evidenced by NPO status ; now r/t nausea/abd pain as evidenced by PO intake < 75%  Goal: TF + Pt to meet >/= 90% of their estimated nutrition needs; unment  Assessment: -Per RN, pt refusing to re-start TF  -Pt with decreasing appetite, only taking 1-2 bites of meals. Appears increasingly lethargic and somnolent, which likely also impacting appetite -Pt willing to consume Ensure during RD follow up, offered additional snacks or supplements, which pt declined. Was eager for solid foods. Consider advancing diet to Dys2 to possibly increase appetite -Consider TPN a nutrition support option as pt continues to have sub-optimal nutrition via PO and intolerance of TF   Intervention: -Consider advancing diet to Dys2 to improve appetite/PO options -Continue Ensure BID -Consider TPN as pt continues to have sub-optimal nutrition via PO and intolerance of TF    Atlee Abide MS RD LDN Clinical Dietitian [phone]

## 2013-10-30 NOTE — Progress Notes (Signed)
[facility] Progress Note     Subjective: Pt appears stress/depressed.  C/o intermittent nausea with trying to take her anti-nausea pill today.  Tolerated her D1 diet without N/V.  Ambulating OOB.  Having flatus.  Last BM on 10/28/13.  Objective: Vital signs in last 24 hours: Temp:  [97.3 F (36.3 C)-98.3 F (36.8 C)] 98.3 F (36.8 C) (09/09 1437) Pulse Rate:  [77-107] 107 (09/09 1437) Resp:  [16] 16 (09/09 1437) BP: (106-148)/(60-90) 148/90 mmHg (09/09 1437) SpO2:  [100 %] 100 % (09/09 1437) Weight:  [140 lb 11.2 oz (63.821 kg)] 140 lb 11.2 oz (63.821 kg) (09/09 0930) Last BM Date: 10/28/13  Intake/Output from previous day: 09/08 0701 - 09/09 0700 In: 120 [P.O.:120] Out: -  Intake/Output this shift: Total I/O In: 120 [P.O.:120] Out: -   PE: Gen:  Alert, NAD, depressed mood Abd: Soft, NT/ND, +BS, no HSM, incisions C/D/I, GJ tube in place to gravity, GJ site clean   Lab Results:   Recent Labs  10/28/13 0835 10/30/13 0400  WBC 5.3 4.0  HGB 8.7* 8.3*  HCT 27.6* 26.4*  PLT 261 263   BMET  Recent Labs  10/29/13 0350 10/30/13 0400  NA 138 138  K 3.5* 3.7  CL 101 100  CO2 28 30  GLUCOSE 132* 108*  BUN 3* 3*  CREATININE 0.78 0.83  CALCIUM 8.7 8.9   PT/INR No results found for this basename: LABPROT, INR,  in the last 72 hours CMP     Component Value Date/Time   NA 138 10/30/2013 0400   K 3.7 10/30/2013 0400   CL 100 10/30/2013 0400   CO2 30 10/30/2013 0400   GLUCOSE 108* 10/30/2013 0400   BUN 3* 10/30/2013 0400   CREATININE 0.83 10/30/2013 0400   CALCIUM 8.9 10/30/2013 0400   PROT 5.8* 10/28/2013 0835   ALBUMIN 2.8* 10/28/2013 0835   AST 27 10/28/2013 0835   ALT 45* 10/28/2013 0835   ALKPHOS 87 10/28/2013 0835   BILITOT 0.7 10/28/2013 0835   GFRNONAA 83* 10/30/2013 0400   GFRAA >90 10/30/2013 0400   Lipase     Component Value Date/Time   LIPASE 35 10/21/2013 1200       Studies/Results: No results found.  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  UGI neg on 9/2.  No evidence of gastric emptying problems.  Pt refuses supplementation per dietitian and not getting sufficient PO.  She is refusing tube feeding.  She did eat some today but per RD's estimation is <75% 2.  Seems a little better again today from nausea perspective, but still c/o intermittent nausea and pain.  Would prefer to get IV dilaudid more often and a higher dosage.  I've told her we need to back off the IV pain meds and try orals to reduce impact on gastric emptying.  Hopefully psych meds will help her long term as it seems she has a diagnosis of somatization disorder.   3.  Try clamping g tube and continue diet, will attempt to advance diet to D2 and see if patient tolerates this and hopefully this will give her more energy.   4.  Increase PO pain meds and decrease IV pain meds to try to reduce impact on gut 5.  Continue antiemetics, ice, heat therapy 6.  Think she can be  discharged soon since no evidence of gastric emptying.  Her mental health issues are definitely affecting her medical status and ability to heal after surgery.      LOS: 8 days    Coralie Keens 10/30/2013, 2:48 PM Pager: [phone]

## 2013-10-31 DIAGNOSIS — IMO0002 Reserved for concepts with insufficient information to code with codable children: Secondary | ICD-10-CM

## 2013-10-31 DIAGNOSIS — M545 Low back pain, unspecified: Secondary | ICD-10-CM

## 2013-10-31 DIAGNOSIS — G8929 Other chronic pain: Secondary | ICD-10-CM

## 2013-10-31 LAB — GLUCOSE, CAPILLARY
GLUCOSE-CAPILLARY: 119 mg/dL — AB (ref 70–99)
GLUCOSE-CAPILLARY: 107 mg/dL — AB (ref 70–99)
Glucose-Capillary: 107 mg/dL — ABNORMAL HIGH (ref 70–99)
Glucose-Capillary: 112 mg/dL — ABNORMAL HIGH (ref 70–99)
Glucose-Capillary: 119 mg/dL — ABNORMAL HIGH (ref 70–99)
Glucose-Capillary: 121 mg/dL — ABNORMAL HIGH (ref 70–99)

## 2013-10-31 MED ORDER — HYDRALAZINE HCL 20 MG/ML IJ SOLN
5.0000 mg | Freq: Four times a day (QID) | INTRAMUSCULAR | Status: DC | PRN
  Administered 2013-10-31 – 2013-11-01 (×3): 5 mg via INTRAVENOUS
  Filled 2013-10-31 (×4): qty 1

## 2013-10-31 MED ORDER — OSMOLITE 1.5 CAL PO LIQD
1000.0000 mL | ORAL | Status: DC
  Filled 2013-10-31 (×5): qty 1000

## 2013-10-31 NOTE — Progress Notes (Signed)
Clinical Social Work  CSW met with patient at bedside. Patient minimally engaged and avoids eye contact. Patient reports that she is tired and is just trying to get some rest. CSW inquired about her mood and any depression. Patient reported that her friend passed away of cancer and she is worried that she is going to die in the hospital. CSW validated patient's feelings and acknowledged why patient would feel worried about her own health after learning the news of her friend. Patient reports that she wants to get better but does not ever want to have a hospital death. Per chart review, PMT has been ordered. CSW encouraged patient to discuss her feelings during [place] meeting and that she would have time to talk about her goals in her care.   CSW will continue to follow.  [place], [place] [phone]

## 2013-10-31 NOTE — Progress Notes (Signed)
Progress Note   Paula [name] HQI:[id number] DOB: 1966/01/27 DOA: 10/22/2013 PCP: Arnoldo Morale, MD   Brief Narrative:   Paula [name] is an 48 y.o. female initially admitted 09/14/13 with one year history of nausea, vomiting and epigastric pain. EGD by Dr. 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 On 09/20/13, she underwent diagnostic laparoscopy, distal gastrectomy with Billroth II anastomosis and feeding GJ tube by Dr. Barry Dienes. UGI on 10/02/13 showed normal post gastrojejunostomy without perforation. Since then she has had problems with by mouth and tube feeding and has had multiple admissions for abdominal pain, nausea and vomiting. During one such admission, palliative consulted and patient wished to continue full curative treatment. Dr. [name] saw patient on 09/14/13 and advised her that she has at least stage III gastric cancer and would definitely need discussion of adjuvant treatment including chemotherapy and XRT-during outpatient followup. She was readmitted 10/22/13 with intractable nausea and vomiting. Despite extensive workup, she is still unable to consistently keep significant amount by mouth or via tube feeds. CCS following. 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   No mechanical causes for her symptoms based on extensive testing-UGI also negative.   Has been unable to consistently tolerate either by mouth or tube feeding.    GI was consulted and states that her stomach is probably volume sensitive & had no further recommendations. GJ tube was changed on 10/27/13 secondary to leak.   Psychiatry consulted to evaluate for psychogenic etiology and she was started on Prozac and Zyprexa.    Surgery continues to follow and have maximized Reglan, clamping G-tube daily, continue J-tube feeding and bowel regimen for constipation. Patient has been refusing her tube feeds however. Diet advanced dysphasia to 10/30/13.  Continue PPI. If patient unable to tolerate adequate tube/by mouth feeding to achieve adequate nutritional state, she will need to go back on TNA. Patient seems to be tolerating soft food-continue to monitor.  At this point, the patient is going to continue to deteriorate, and lon  Active Problems: Chronic back pain  The patient's ongoing back pain and need for IV Dilaudid-HP for pain control is puzzling. An MRI of her lumbar spine done 10/11/13 was negative for fracture or metastatic disease with normal bone marrow signal.  She has had frequent rehospitalizations for management of this pain, but is now willing to speak with the palliative care team for pain management. If we do not get her pain adequately controlled, I fear she will continue to return to the hospital for pain relief.  Gastric cancer, at least stage III  Discussed with Dr. [name] on 10/23/13 who recommends outpatient followup when GI issues have settled for discussion regarding further cancer treatment.   CT head negative for metastasis.   Radiation oncology input appreciated, 5 weeks of radiation planned once symptoms adequately controlled.   Chronic Anemia  No reported bleeding. Hb stable.   Hypertension  Controlled. Continue carvedilol.   History of GERD  Continue PPI.   Severe malnutrition in the context of chronic illness  Seen by dietitian 10/29/13 with recommendations to resume tube feedings at 25 mL/hour as tolerated. Has been refusing.  Weight down 14 pounds since 10/22/13.  Confusion  As per nursing, patient has been intermittently confused and somnolent at times.  No evidence of brain metastasis  on head CT.  Minimize sedative medications-narcotics and Ativan. Ongoing sedation  likely from initiation of therapy with Zyprexa.  Hypokalemia  Replaced.  DVT Prophylaxis  Continue Lovenox.  Code Status: Full. Family Communication: No family at the bedside. Disposition Plan: Home when stable.   IV Access:    Peripheral IV   Procedures and diagnostic studies:    GJ tube was changed by IR on 10/27/13.   Medical Consultants:    Dr. Excell Seltzer, General surgery   Dr. Anson Fret, Gastroenterology   Dr. Eppie Gibson, Radiation oncology   Interventional radiology   Dr. Zorita Pang, Psychiatry  Palliative care    Other Consultants:    Dietitian   Anti-Infectives:    None.  Subjective:   Paula [name] continues to report severe back pain. Feels discouraged but tells me she has not "given up ". Although she denies that she is depressed, she clearly has a very depressed affect and talks about the recent death of a friend who also died of cancer.  Objective:    Filed Vitals:   10/30/13 1437 10/30/13 2011 10/31/13 0400 10/31/13 0426  BP: 148/90 173/98 111/64   Pulse: 107 111 105   Temp: 98.3 F (36.8 C) 98.8 F (37.1 C) 98.8 F (37.1 C)   TempSrc: Oral Oral Oral   Resp: 16 18 16    Height:      Weight:    61.508 kg (135 lb 9.6 oz)  SpO2: 100% 100% 100%     Intake/Output Summary (Last 24 hours) at 10/31/13 0755 Last data filed at 10/31/13 0413  Gross per 24 hour  Intake    260 ml  Output    250 ml  Net     10 ml    Exam: Gen:  Sedated Psych: Depressed affect Cardiovascular:  RRR, No M/R/G Respiratory:  Lungs CTAB Gastrointestinal:  Abdomen soft, tender with positive bowel sounds, J-tube intact Extremities:  No C/E/C   Data Reviewed:    Labs: Basic Metabolic Panel:  Recent Labs Lab 10/28/13 0835 10/28/13 0840 10/29/13 0350 10/30/13 0400  NA 139  --  138 138  K 3.4*  --  3.5* 3.7  CL 99  --  101 100  CO2 29  --  28 30  GLUCOSE 128*  --  132* 108*  BUN 3*  --  3* 3*  CREATININE 0.74  --   0.78 0.83  CALCIUM 8.5  --  8.7 8.9  MG  --  1.5  --  1.6   GFR Estimated Creatinine Clearance: 69.4 ml/min (by C-G formula based on Cr of 0.83). Liver Function Tests:  Recent Labs Lab 10/28/13 0835  AST 27  ALT 45*  ALKPHOS 87  BILITOT 0.7  PROT 5.8*  ALBUMIN 2.8*    Recent Labs Lab 10/28/13 0835  AMMONIA 23   CBC:  Recent Labs Lab 10/25/13 0523 10/28/13 0835 10/30/13 0400  WBC 5.2 5.3 4.0  HGB 9.3* 8.7* 8.3*  HCT 29.9* 27.6* 26.4*  MCV 80.8 80.5 79.3  PLT 307 261 263   CBG:  Recent Labs Lab 10/30/13 1224 10/30/13 1621 10/30/13 2008 10/30/13 2356 10/31/13 0403  GLUCAP 112* 105* 113* 121* 112*   Microbiology No results found for this or any previous visit (from the past 240 hour(s)).   Medications:   . carvedilol  6.25 mg Oral BID WC  . enoxaparin (LOVENOX) injection  40 mg Subcutaneous Q24H  . feeding supplement (ENSURE COMPLETE)  237 mL Oral BID BM  .  FLUoxetine  10 mg Oral Daily  . lip balm  1 application Topical BID  . LORazepam  0.5 mg Oral TID  . metoCLOPramide  10 mg Oral TID AC & HS  . OLANZapine zydis  5 mg Oral QHS  . pantoprazole  40 mg Oral BID AC  . polyethylene glycol  17 g Oral BID  . scopolamine  1 patch Transdermal Q72H  . sodium chloride  3 mL Intravenous Q12H   Continuous Infusions: . sodium chloride Stopped (10/28/13 1528)  . feeding supplement (OSMOLITE 1.5 CAL) Stopped (10/28/13 1433)    Time spent: 25 minutes.   LOS: 9 days   [place] [facility] Pager [phone]. If unable to reach me by pager, please call my cell phone at [phone].  *Please refer to [email or web address], password [email or web address] to get updated schedule on who will round on this patient, as hospitalists switch teams weekly. If 7PM-7AM, please contact night-coverage at [email or web address], password [email or web address] for any overnight needs.  10/31/2013, 7:55 AM

## 2013-10-31 NOTE — Progress Notes (Signed)
Patient offered to start tube feedings multiple times today and said "not right now". Patient was instructed to let nursing know when she was ready to initiate her tube feedings. Patient verbalized understanding. Will continue to monitor.

## 2013-10-31 NOTE — Progress Notes (Signed)
Thank you for consulting the Palliative Medicine Team at [facility] to meet your patient's and family's needs.   The reason that you asked Paula [name] to see your patient is  For Clarification of GOC and options  We have scheduled your patient for a meeting: 11-01-13 at 58  Mother and two sons will be present  Wadie Lessen NP  Palliative Medicine Team Team Phone # [phone] Pager [phone]  Discussed with Dr Rockne Menghini

## 2013-10-31 NOTE — Progress Notes (Signed)
Chaplain Follow up.  Paula Paula [name] was very sleepy and desired a spiritual care visit at another time. She did request prayer and the chaplain responded to this request.   Please page a chaplain when Paula [name] desires a spiritual care follow-up/  Sallee Lange. Lynell Kussman, [place]

## 2013-10-31 NOTE — Progress Notes (Signed)
Clinical Social Work  CSW spoke with bedside RN prior to meeting with patient. RN reports that patient's friend recently passed away from cancer and patient has been tearful. CSW went to room to meet with patient. Patient laying in bed and avoided eye contact. Patient reports she needs to rest but reported CSW could come back later this afternoon. CSW will continue to follow.  Paula [name], [place] [phone]

## 2013-11-01 ENCOUNTER — Inpatient Hospital Stay (HOSPITAL_COMMUNITY): Payer: No Typology Code available for payment source

## 2013-11-01 ENCOUNTER — Encounter: Payer: Self-pay | Admitting: Specialist

## 2013-11-01 DIAGNOSIS — Z515 Encounter for palliative care: Secondary | ICD-10-CM

## 2013-11-01 LAB — GLUCOSE, CAPILLARY
GLUCOSE-CAPILLARY: 137 mg/dL — AB (ref 70–99)
GLUCOSE-CAPILLARY: 108 mg/dL — AB (ref 70–99)
Glucose-Capillary: 124 mg/dL — ABNORMAL HIGH (ref 70–99)
Glucose-Capillary: 138 mg/dL — ABNORMAL HIGH (ref 70–99)
Glucose-Capillary: 110 mg/dL — ABNORMAL HIGH (ref 70–99)

## 2013-11-01 MED ORDER — CLONIDINE HCL 0.2 MG/24HR TD PTWK
0.2000 mg | MEDICATED_PATCH | TRANSDERMAL | Status: DC
  Administered 2013-11-01: 0.2 mg via TRANSDERMAL
  Filled 2013-11-01: qty 1

## 2013-11-01 MED ORDER — HYDROMORPHONE HCL PF 1 MG/ML IJ SOLN
1.0000 mg | INTRAMUSCULAR | Status: DC | PRN
  Administered 2013-11-01 – 2013-11-04 (×9): 1 mg via INTRAVENOUS
  Filled 2013-11-01 (×10): qty 1

## 2013-11-01 MED ORDER — IOHEXOL 300 MG/ML  SOLN
50.0000 mL | INTRAMUSCULAR | Status: AC
  Administered 2013-11-01: 25 mL via ORAL

## 2013-11-01 MED ORDER — IOHEXOL 300 MG/ML  SOLN
100.0000 mL | Freq: Once | INTRAMUSCULAR | Status: AC | PRN
  Administered 2013-11-01: 100 mL via INTRAVENOUS

## 2013-11-01 MED ORDER — HYDROMORPHONE HCL 2 MG PO TABS
2.0000 mg | ORAL_TABLET | ORAL | Status: DC | PRN
  Administered 2013-11-01 (×2): 2 mg via ORAL
  Filled 2013-11-01 (×2): qty 1

## 2013-11-01 MED ORDER — HYDROMORPHONE HCL PF 1 MG/ML IJ SOLN
1.0000 mg | INTRAMUSCULAR | Status: AC
  Administered 2013-11-01: 1 mg via INTRAVENOUS
  Filled 2013-11-01: qty 1

## 2013-11-01 NOTE — Plan of Care (Signed)
Problem: Discharge Progression Outcomes Goal: Tolerating diet Outcome: Not Met (add Reason) Patient admitted with intractable nausea and vomiting,anti emetics given.

## 2013-11-01 NOTE — Progress Notes (Signed)
Progress Note   Paula [name] MPN:[id number] DOB: 02-07-1966 DOA: 10/22/2013 PCP: Arnoldo Morale, MD   Brief Narrative:   Paula [name] is an 48 y.o. female initially admitted 09/14/13 with one year history of nausea, vomiting and epigastric pain. EGD by Dr. 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 On 09/20/13, she underwent diagnostic laparoscopy, distal gastrectomy with Billroth II anastomosis and feeding GJ tube by Dr. Barry Dienes. UGI on 10/02/13 showed normal post gastrojejunostomy without perforation. Since then she has had problems with by mouth and tube feeding and has had multiple admissions for abdominal pain, nausea and vomiting. During one such admission, palliative consulted and patient wished to continue full curative treatment. Dr. [name] saw patient on 09/14/13 and advised her that she has at least stage III gastric cancer and would definitely need discussion of adjuvant treatment including chemotherapy and XRT-during outpatient followup. She was readmitted 10/22/13 with intractable nausea and vomiting. Despite extensive workup, she is still unable to consistently keep significant amount by mouth or via tube feeds. CCS following. 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  Assessment/Plan:   Principal Problem: Intractable nausea and vomiting, multifactorial, with gastric atony and possible somatization disorder contributory  Psychogenic etiology possibly contributory, but given ongoing symptoms of intolerance to enteral feeding, will obtain CT of the abdomen and pelvis.   No mechanical causes for her symptoms based on extensive testing in the past-UGI -10/02/13.   Has been unable to consistently tolerate either by mouth or tube feeding since surgery 09/20/13.    GI was consulted and felt that her stomach was probably volume sensitive &  had no further recommendations. GJ tube was changed on 10/27/13 secondary to leak.   Psychiatry consulted to evaluate for psychogenic etiology and she was started on Prozac and Zyprexa to address symptoms of depression.   Surgery continues to follow and have maximized Reglan, clamping G-tube daily, continue J-tube feeding and bowel regimen for constipation. Patient has been refusing her tube feeds however. Diet advanced dysphasia to 10/30/13, but she continues to suffer with nausea/vomiting.  Continue PPI. Start TNA given ongoing intolerance to enteral feeding.  Palliative care consultation scheduled for today to address symptom management/goals of care.  Active Problems: Chronic back pain  The patient's ongoing back pain and need for IV Dilaudid-HP for pain control is puzzling. An MRI of her lumbar spine done 10/11/13 was negative for fracture or metastatic disease with normal bone marrow signal.  She has had frequent rehospitalizations for management of this pain, but is now willing to speak with the palliative care team for pain management. If we do not get her pain adequately controlled, I fear she will continue to return to the hospital for pain relief.  Unable to tolerate switch to oral Dilaudid-HP for pain control.  Gastric cancer, at least stage III  Discussed with Dr. [name] on 10/23/13 who recommends outpatient followup when GI issues have settled for discussion regarding further cancer treatment.   CT head negative for metastasis.   Radiation oncology input appreciated, 5 weeks of radiation planned once symptoms adequately controlled.   Repeat CT scan of abdomen and pelvis today.  Chronic Anemia  No reported bleeding. Hb stable.   Hypertension  Controlled. Continue carvedilol.   History of GERD  Continue PPI.   Severe malnutrition in the context of chronic illness  Seen by dietitian 10/29/13 with recommendations to resume tube feedings  at 25 mL/hour as tolerated. Has  been refusing.  Weight down 14 pounds since 10/22/13.  Confusion  As per nursing, patient has been intermittently confused and somnolent at times.  No evidence of brain metastasis on head CT.  Minimize sedative medications-narcotics and Ativan. Ongoing sedation likely from initiation of therapy with Zyprexa.  Hypokalemia  Replaced.  DVT Prophylaxis  Continue Lovenox.  Code Status: Full. Family Communication: Extended family updated at the bedside in consultation with Irena Reichmann, NP of palliative care. Disposition Plan: Home when stable.   IV Access:    Peripheral IV   Procedures and diagnostic studies:    GJ tube was changed by IR on 10/27/13.   Medical Consultants:    Dr. Excell Seltzer, General surgery   Dr. Anson Fret, Gastroenterology   Dr. Eppie Gibson, Radiation oncology   Interventional radiology   Dr. Zorita Pang, Psychiatry  Irena Reichmann, NP, Palliative care    Other Consultants:    Dietitian   Anti-Infectives:    None.  Subjective:   Paula [name] continues to report severe back pain and abdominal pain.  Has had bilious vomiting today.    Objective:    Filed Vitals:   10/31/13 1330 10/31/13 2010 10/31/13 2224 11/01/13 0352  BP: 155/91 169/105 155/92 170/104  Pulse: 106 114 112 126  Temp: 98.1 F (36.7 C) 98.7 F (37.1 C)  98.5 F (36.9 C)  TempSrc: Oral Oral  Oral  Resp: 20 19    Height:      Weight:    61.553 kg (135 lb 11.2 oz)  SpO2: 100% 100%  100%    Intake/Output Summary (Last 24 hours) at 11/01/13 0724 Last data filed at 11/01/13 0353  Gross per 24 hour  Intake     50 ml  Output    100 ml  Net    -50 ml    Exam: Gen:  NAD Psych: Depressed affect Cardiovascular:  RRR, No M/R/G Respiratory:  Lungs CTAB Gastrointestinal:  Abdomen soft, tender with positive bowel sounds, J-tube draining bilious colored fluid Extremities:  No C/E/C   Data Reviewed:    Labs: Basic Metabolic  Panel:  Recent Labs Lab 10/28/13 0835 10/28/13 0840 10/29/13 0350 10/30/13 0400  NA 139  --  138 138  K 3.4*  --  3.5* 3.7  CL 99  --  101 100  CO2 29  --  28 30  GLUCOSE 128*  --  132* 108*  BUN 3*  --  3* 3*  CREATININE 0.74  --  0.78 0.83  CALCIUM 8.5  --  8.7 8.9  MG  --  1.5  --  1.6   GFR Estimated Creatinine Clearance: 69.4 ml/min (by C-G formula based on Cr of 0.83). Liver Function Tests:  Recent Labs Lab 10/28/13 0835  AST 27  ALT 45*  ALKPHOS 87  BILITOT 0.7  PROT 5.8*  ALBUMIN 2.8*    Recent Labs Lab 10/28/13 0835  AMMONIA 23   CBC:  Recent Labs Lab 10/28/13 0835 10/30/13 0400  WBC 5.3 4.0  HGB 8.7* 8.3*  HCT 27.6* 26.4*  MCV 80.5 79.3  PLT 261 263   CBG:  Recent Labs Lab 10/31/13 1200 10/31/13 1624 10/31/13 2007 10/31/13 2352 11/01/13 0347  GLUCAP 121* 119* 107* 107* 124*   Microbiology No results found for this or any previous visit (from the past 240 hour(s)).   Medications:   . carvedilol  6.25 mg Oral BID WC  . enoxaparin (LOVENOX) injection  40 mg Subcutaneous Q24H  . feeding supplement (ENSURE COMPLETE)  237 mL Oral BID BM  . feeding supplement (OSMOLITE 1.5 CAL)  1,000 mL Per Tube Q24H  . FLUoxetine  10 mg Oral Daily  . lip balm  1 application Topical BID  . LORazepam  0.5 mg Oral TID  . metoCLOPramide  10 mg Oral TID AC & HS  . OLANZapine zydis  5 mg Oral QHS  . pantoprazole  40 mg Oral BID AC  . polyethylene glycol  17 g Oral BID  . scopolamine  1 patch Transdermal Q72H  . sodium chloride  3 mL Intravenous Q12H   Continuous Infusions: . sodium chloride Stopped (10/28/13 1528)    Time spent: 35 minutes with > 50% of time discussing current diagnostic test results, clinical impression and plan of care with the patient and her family.   LOS: 10 days   [place] [facility] Pager [phone]. If unable to reach me by pager, please call my cell phone at [phone].  *Please refer to [email or web address],  password [email or web address] to get updated schedule on who will round on this patient, as hospitalists switch teams weekly. If 7PM-7AM, please contact night-coverage at [email or web address], password [email or web address] for any overnight needs.  11/01/2013, 7:24 AM

## 2013-11-01 NOTE — Progress Notes (Signed)
Follow up on patient from other day. Many family members were also in the room at the time of the visit. When offered support, patient asked for prayer. Gathered the family and prayed with them.  Doctor and NP entered the room. Went and brought patient a prayer shawl. Will continue to follow.  Epifania Gore, PhD, [place]

## 2013-11-01 NOTE — Progress Notes (Signed)
Patient interviewed and examined, agree with PA note above. I doubt a surgical problem with recent negative upper GI series. CT scan has been ordered to evaluate persistent vomiting. Will followup tomorrow.  Edward Jolly MD, FACS  11/01/2013 7:16 PM

## 2013-11-01 NOTE — Consult Note (Signed)
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 Since then continued nausea and vomiting, poor po tolerance, frequent hospitalizations, severe pain and missed appointments with oncology.  Psychiatry initiated meds for depression and anxiety.  Patient expresses frustration with continued pain, nausea and vomiting and feeling " no one is doing anything to figure this out, they think  Its all in my head".  Family at bedside verbalizing that they are considering  transfer to another hospital for second opinion.  They do not believe this is an emotional response  During this visit I assisted patient to the bathroom whee she knelt on the floor by toilet and vomited a large about of bilious liquid.  She is weak and c/o severe abdominal pain which radiates to her back  Her family express great concern telling me this is not a "weak woman" and this is totally out of character for her and they believe "there is definitely something wrong, she does not typically give into pain"   Consult is for review of medical treatment options, clarification of goals of care, advancd directives and HPOA, disposition and options, and symptom recommendation.  This NP Wadie Lessen reviewed medical records, received report from team, assessed the patient and then meet at the patient's bedside along with her two sons, mother, several cousins and brother to discuss diagnosis prognosis, [place], EOL wishes disposition and options.  A detailed discussion was had  today regarding advanced directives.  Concepts specific to code status, artifical feeding and hydration, continued IV antibiotics and rehospitalization was had.  The difference between a aggressive medical intervention path  and a palliative comfort care path for this patient at this time was had.  Values and goals of care important to patient and family were attempted to be elicited.  Concept of  Palliative Care and its role in holistic care was discussed  Questions and concerns addressed. Family encouraged to call with questions or concerns.  PMT will continue to support holistically.   Goals of Care: 1.  Code Status: Full code   2. Scope of Treatment: 1. Vital Signs: per unit 2. Respiratory/Oxygen: as needed 3. Nutritional Support/Tube Feeds: Open to use of TPN  4. Antibiotics: yes 5. Blood Products: yes 6. IVF: yes, open to PICC placement 7. Labs:yes,  Attending to repeat CT abd today 8. Consults: Desires consult with oncology.  Due to severity of symptoms patietn has been unable to make OP oncology appointments   3. Disposition:   Home when medically stable,  Hopeful for treatment to extend quality life.   4. Symptom Management:   1. Anxiety/Agitation: Ativan  2. Pain: Dilaudid 1 mg IV every 3 hrs prn                 -discussed role of stress and anxiety on pain threshold               -discussed atrategies of deep breathing and prayer and meditation for pain control                 -  utilize K-pad 3. Nausea/Vomiting: Zofran 4 mg IV every 6 hrs 4. Depression: Prozac and Zyprexa per psychiatry   5. Psychosocial:  Emotional support offered to patient and her family.  All feel frustration with a devastating diagnosis and trying to navigate the healthcare system  6. Spiritual: [place] involved in care      Brief HPI:47 y/o admitted 09/14/13 with 1 year of nausea, vomiting and epigastric pain. EGD  showed 3 ulcers in the antrum of the stomach,  Biopsy's were consistend with  poorly differentiated Adenocarcinoma with Signet ring features. She was taken to the OR on 09/20/13 and underwent Diagnostic laparoscopy, distal gastrectomy with billroth II anastamosis and feeding gastrojejunostomy tube, by Dr. Barry Dienes. UGI on 10/02/13 shoed normal post gastrojejunostomy without perforation. Her diet was slowly advanced but she has ongoing issues with pain and nausea  .Pathology shows a stage III gastric cancer. Dr. [name] saw her and is going to recommend chemo and radiation therapy after her recovery.   Poor success to  series of trial of  Clear liquids to full liquids, She was seen later that evening in the ED with nausea, dry heaves, abdominal pain and vomiting. She is seeking full curative treatment at this time. She was discharged home again on 10/17/13, on phenergan, coreg, fentanyl, Prozac lido derm, Reglan, oxycodone solution, Prilosec,sennakot and Ambien. She was back to the ED on 8/29 with pain nausea, she was discharged home at that time. She requested dilaudid in place of patch during that visit. She is readmitted again on 10/22/13, with ongoing nausea, vomiting and pain, unrelieved since her surgery.   ROS: intermittant severe LLQ abdominal pain radiating to lower back   PMH:  Past Medical History  Diagnosis Date  . Headache(784.0) 2011    Migraines  . Esophageal reflux     On omeprazole  . Costochondritis   . Hypertension     started around 2011  . Ovarian cyst   . Stomach cancer      PSH: Past Surgical History  Procedure Laterality Date  . Oophorectomy      Around 1985 left ovary removal  . Tonsillectomy      Around 1994  . Tubal ligation    . Esophagogastroduodenoscopy N/A 09/16/2013    Procedure: ESOPHAGOGASTRODUODENOSCOPY (EGD);  Surgeon: Wonda Horner, MD;  Location: [facility];  Service: Endoscopy;  Laterality: N/A;  . Laparoscopy N/A 09/20/2013    Procedure: DIAGNOSTIC LAPAROSCOPY,DISTAL GASTRECTOMY AND FEEDING GASTROJEJUNOSTOMY;  Surgeon: [name], MD;  Location: [facility];  Service: General;  Laterality: N/A;   I have reviewed the [place] and SH and  If appropriate update it with new information. No Known Allergies Scheduled Meds: . carvedilol  6.25 mg Oral BID WC  . cloNIDine  0.2 mg Transdermal Weekly  . enoxaparin (LOVENOX) injection  40 mg Subcutaneous Q24H  . feeding supplement (ENSURE COMPLETE)  237 mL Oral BID BM  . feeding supplement (OSMOLITE 1.5 CAL)  1,000 mL Per Tube Q24H  . FLUoxetine  10 mg Oral Daily  . lip balm  1 application Topical BID  . LORazepam  0.5 mg Oral TID  . metoCLOPramide  10 mg Oral TID AC & HS  . OLANZapine zydis  5 mg Oral QHS  . pantoprazole  40 mg Oral BID AC  . polyethylene glycol  17 g Oral BID  . scopolamine  1 patch Transdermal Q72H  . sodium chloride  3 mL Intravenous Q12H   Continuous Infusions: . sodium chloride Stopped (10/28/13  1528)   PRN Meds:.acetaminophen, acetaminophen, alum & mag hydroxide-simeth, hydrALAZINE, HYDROmorphone (DILAUDID) injection, ketorolac, magic mouthwash, naLOXone (NARCAN)  injection, ondansetron (ZOFRAN) IV, ondansetron, promethazine    BP 153/92  Pulse 108  Temp(Src) 98.5 F (36.9 C) (Oral)  Resp 19  Ht 5' (1.524 m)  Wt 61.553 kg (135 lb 11.2 oz)  BMI 26.50 kg/m2  SpO2 100%  LMP 09/12/2013   PPS: 40 %   Intake/Output Summary (Last 24 hours) at 11/01/13 1437 Last data filed at 11/01/13 1014  Gross per 24 hour  Intake     83 ml  Output    100 ml  Net    -17 ml   LBM:  patient tells me last BM two days ago and is passing a lot of gas   Physical Exam:  General: ill appearing HEENT:  Dry buccal membranes Chest: decreased in bases CTA CVS: tachycardic Abdomen: noted G-tube Dressing C/D/I,  Ext: no edema Neuro: lethargic, alert and orieted  Labs: CBC    Component Value Date/Time   WBC 4.0 10/30/2013 0400   RBC 3.33* 10/30/2013 0400   HGB 8.3* 10/30/2013 0400   HCT 26.4* 10/30/2013 0400   PLT 263 10/30/2013 0400   MCV 79.3 10/30/2013 0400    MCH 24.9* 10/30/2013 0400   MCHC 31.4 10/30/2013 0400   RDW 15.3 10/30/2013 0400   LYMPHSABS 0.8 10/21/2013 1200   MONOABS 0.2 10/21/2013 1200   EOSABS 0.0 10/21/2013 1200   BASOSABS 0.0 10/21/2013 1200    BMET    Component Value Date/Time   NA 138 10/30/2013 0400   K 3.7 10/30/2013 0400   CL 100 10/30/2013 0400   CO2 30 10/30/2013 0400   GLUCOSE 108* 10/30/2013 0400   BUN 3* 10/30/2013 0400   CREATININE 0.83 10/30/2013 0400   CALCIUM 8.9 10/30/2013 0400   GFRNONAA 83* 10/30/2013 0400   GFRAA >90 10/30/2013 0400    CMP     Component Value Date/Time   NA 138 10/30/2013 0400   K 3.7 10/30/2013 0400   CL 100 10/30/2013 0400   CO2 30 10/30/2013 0400   GLUCOSE 108* 10/30/2013 0400   BUN 3* 10/30/2013 0400   CREATININE 0.83 10/30/2013 0400   CALCIUM 8.9 10/30/2013 0400   PROT 5.8* 10/28/2013 0835   ALBUMIN 2.8* 10/28/2013 0835   AST 27 10/28/2013 0835   ALT 45* 10/28/2013 0835   ALKPHOS 87 10/28/2013 0835   BILITOT 0.7 10/28/2013 0835   GFRNONAA 83* 10/30/2013 0400   GFRAA >90 10/30/2013 0400    Time In Time Out Total Time Spent with Patient Total Overall Time  1230 1445 120 min 135 min    Greater than 50%  of this time was spent counseling and coordinating care related to the above assessment and plan.   Wadie Lessen NP  Palliative Medicine Team Team Phone # [phone] Pager [phone]  Discussed with Dr Rockne Menghini

## 2013-11-01 NOTE — Progress Notes (Signed)
[facility] Progress Note     Subjective: Pt says she's doing better, but then starts crying and saying she keeps getting nauseated and vomiting.  She asks me what its from and why she isn't getting better.  She asks if she has a kink in her bowels.  I tell here everything from the surgery is doing well.  She knows she's stressed from her medical problems and being stuck in the hospital and she's stressed from feeling bad.  We discussed that she has to work with the medical team because she's not getting enough nutrition and she's refusing our help including TF, TPN, and supplements.  We discuss how the psych meds will take time to work 4-6 weeks, but that she should continue these in hopes to help diminish her symptoms.  Objective: Vital signs in last 24 hours: Temp:  [98.1 F (36.7 C)-98.7 F (37.1 C)] 98.5 F (36.9 C) (09/11 0352) Pulse Rate:  [106-126] 112 (09/11 0812) Resp:  [19-20] 19 (09/10 2010) BP: (155-181)/(89-105) 175/92 mmHg (09/11 0812) SpO2:  [100 %] 100 % (09/11 0352) Weight:  [135 lb 11.2 oz (61.553 kg)] 135 lb 11.2 oz (61.553 kg) (09/11 0352) Last BM Date: 10/28/13  Intake/Output from previous day: 09/10 0701 - 09/11 0700 In: 50 [P.O.:30] Out: 100 [Drains:100] Intake/Output this shift:    PE: Gen:  Alert, NAD, pleasant Abd: Soft, NT/ND, +BS, no HSM, abdominal scars noted and well healed, GJ tube in place, clean   Lab Results:   Recent Labs  10/30/13 0400  WBC 4.0  HGB 8.3*  HCT 26.4*  PLT 263   BMET  Recent Labs  10/30/13 0400  NA 138  K 3.7  CL 100  CO2 30  GLUCOSE 108*  BUN 3*  CREATININE 0.83  CALCIUM 8.9   PT/INR No results found for this basename: LABPROT, INR,  in the last 72 hours CMP     Component Value Date/Time   NA 138 10/30/2013 0400   K 3.7 10/30/2013 0400   CL 100 10/30/2013 0400   CO2 30 10/30/2013 0400   GLUCOSE 108* 10/30/2013 0400   BUN 3* 10/30/2013 0400   CREATININE 0.83 10/30/2013 0400   CALCIUM 8.9 10/30/2013  0400   PROT 5.8* 10/28/2013 0835   ALBUMIN 2.8* 10/28/2013 0835   AST 27 10/28/2013 0835   ALT 45* 10/28/2013 0835   ALKPHOS 87 10/28/2013 0835   BILITOT 0.7 10/28/2013 0835   GFRNONAA 83* 10/30/2013 0400   GFRAA >90 10/30/2013 0400   Lipase     Component Value Date/Time   LIPASE 35 10/21/2013 1200       Studies/Results: No results found.  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 UGI neg on 9/2. No evidence of gastric emptying problems. Pt refuses supplementation and not getting sufficient PO. She is refusing tube feeding on and off. She is tolerating some D2 diet, but still having vomiting. 2. Waxes and wanes, still c/o intermittent nausea, vomiting, and pain.  Hopefully psych meds will help her long term as it seems she has a diagnosis of somatization disorder.  Discussed with her the impact of stress on her medical conditions and how this can be impacting her N/V and worsened pain symptoms.    3. Advance diet as tolerated, can re-attempt TF if  she is agreeable.  Goals of care meeting today with palliative. 4. Limit narcotics unless this become a goal of palliative. 5. Continue antiemetics, ice, heat therapy  6. Think she can be discharged soon since no evidence of gastric emptying. Her mental health issues are definitely affecting her medical status.  No other surgical recommendations as she seems to be doing well from this perspective.   7. Flush gastrojejunostomy tube (BOTH Gastric & jejunal ports) with 30 mL of warm tap water air every 6 hours to prevent the tube from being clogged.  No medicines through a jejunostomy port.  If need to give medicines through the tube because the patient cannot or refuses to take it orally, provide in elixir form or crushed through gastric port. 8. We will see her again on  Monday if she's still here, but okay to d/c from our perspective    LOS: 10 days    [place], [facility] 11/01/2013, 9:16 AM Pager: [phone]

## 2013-11-02 LAB — DIFFERENTIAL
BASOS PCT: 0 % (ref 0–1)
Basophils Absolute: 0 10*3/uL (ref 0.0–0.1)
EOS PCT: 1 % (ref 0–5)
Eosinophils Absolute: 0.1 10*3/uL (ref 0.0–0.7)
Lymphocytes Relative: 24 % (ref 12–46)
Lymphs Abs: 1.3 10*3/uL (ref 0.7–4.0)
MONO ABS: 0.8 10*3/uL (ref 0.1–1.0)
MONOS PCT: 15 % — AB (ref 3–12)
Neutro Abs: 3.2 10*3/uL (ref 1.7–7.7)
Neutrophils Relative %: 60 % (ref 43–77)

## 2013-11-02 LAB — COMPREHENSIVE METABOLIC PANEL
ALK PHOS: 94 U/L (ref 39–117)
ALT: 54 U/L — AB (ref 0–35)
AST: 15 U/L (ref 0–37)
Albumin: 3.6 g/dL (ref 3.5–5.2)
Anion gap: 16 — ABNORMAL HIGH (ref 5–15)
BILIRUBIN TOTAL: 0.6 mg/dL (ref 0.3–1.2)
BUN: 19 mg/dL (ref 6–23)
CHLORIDE: 93 meq/L — AB (ref 96–112)
CO2: 25 mEq/L (ref 19–32)
Calcium: 9.7 mg/dL (ref 8.4–10.5)
Creatinine, Ser: 1.06 mg/dL (ref 0.50–1.10)
GFR calc Af Amer: 72 mL/min — ABNORMAL LOW (ref >90)
GFR, EST NON AFRICAN AMERICAN: 62 mL/min — AB (ref >90)
GLUCOSE: 97 mg/dL (ref 70–99)
POTASSIUM: 3.6 meq/L — AB (ref 3.7–5.3)
SODIUM: 134 meq/L — AB (ref 137–147)
Total Protein: 7.3 g/dL (ref 6.0–8.3)

## 2013-11-02 LAB — CBC
HEMATOCRIT: 33.6 % — AB (ref 36.0–46.0)
HEMOGLOBIN: 10.6 g/dL — AB (ref 12.0–15.0)
MCH: 24.8 pg — AB (ref 26.0–34.0)
MCHC: 31.5 g/dL (ref 30.0–36.0)
MCV: 78.5 fL (ref 78.0–100.0)
Platelets: 352 10*3/uL (ref 150–400)
RBC: 4.28 MIL/uL (ref 3.87–5.11)
RDW: 15.2 % (ref 11.5–15.5)
WBC: 5.5 10*3/uL (ref 4.0–10.5)

## 2013-11-02 LAB — GLUCOSE, CAPILLARY
GLUCOSE-CAPILLARY: 99 mg/dL (ref 70–99)
Glucose-Capillary: 105 mg/dL — ABNORMAL HIGH (ref 70–99)
Glucose-Capillary: 81 mg/dL (ref 70–99)
Glucose-Capillary: 89 mg/dL (ref 70–99)
Glucose-Capillary: 93 mg/dL (ref 70–99)
Glucose-Capillary: 95 mg/dL (ref 70–99)

## 2013-11-02 LAB — MAGNESIUM: Magnesium: 2 mg/dL (ref 1.5–2.5)

## 2013-11-02 LAB — PHOSPHORUS: PHOSPHORUS: 4.3 mg/dL (ref 2.3–4.6)

## 2013-11-02 MED ORDER — INSULIN ASPART 100 UNIT/ML ~~LOC~~ SOLN: 0.0000 [IU] | Freq: Four times a day (QID) | SUBCUTANEOUS | Status: DC

## 2013-11-02 MED ORDER — LORAZEPAM 2 MG/ML IJ SOLN
0.5000 mg | Freq: Three times a day (TID) | INTRAMUSCULAR | Status: DC
  Administered 2013-11-02 – 2013-11-03 (×4): 0.5 mg via INTRAVENOUS
  Filled 2013-11-02 (×4): qty 1

## 2013-11-02 NOTE — Progress Notes (Signed)
Peripherally Inserted Central Catheter/Midline Placement  The IV Nurse has discussed with the patient and/or persons authorized to consent for the patient, the purpose of this procedure and the potential benefits and risks involved with this procedure.  The benefits include less needle sticks, lab draws from the catheter and patient may be discharged home with the catheter.  Risks include, but not limited to, infection, bleeding, blood clot (thrombus formation), and puncture of an artery; nerve damage and irregular heat beat.  Alternatives to this procedure were also discussed.    Unable to thread guidewire past the axilla region.  Able to access vein x3 with great blood return, but unable to thread basilic or brachial guidewire.  Cephalic vein >44%- did not attempt to stick.  Dr. Rockne Menghini notified by staff RN.   PICC/Midline Placement Documentation        Paula [name] 11/02/2013, 6:23 PM

## 2013-11-02 NOTE — Progress Notes (Signed)
[facility] Progress Note     Subjective: Pt says she's doing better  Objective: Vital signs in last 24 hours: Temp:  [97.7 F (36.5 C)-98.6 F (37 C)] 97.9 F (36.6 C) (09/12 0447) Pulse Rate:  [98-115] 103 (09/12 0447) Resp:  [16-18] 16 (09/12 0447) BP: (100-181)/(60-102) 100/60 mmHg (09/12 0447) SpO2:  [100 %] 100 % (09/12 0447) Weight:  [131 lb 8 oz (59.648 kg)] 131 lb 8 oz (59.648 kg) (09/12 0643) Last BM Date: 10/28/13  Intake/Output from previous day: 09/11 0701 - 09/12 0700 In: 63 [I.V.:3] Out: 125 [Drains:125] Intake/Output this shift:    PE: Gen:  Alert, NAD, pleasant Abd: Soft, NT/ND, +BS, no HSM, abdominal scars noted and well healed, GJ tube in place, clean   Lab Results:  No results found for this basename: WBC, HGB, HCT, PLT,  in the last 72 hours BMET No results found for this basename: NA, K, CL, CO2, GLUCOSE, BUN, CREATININE, CALCIUM,  in the last 72 hours PT/INR No results found for this basename: LABPROT, INR,  in the last 72 hours CMP     Component Value Date/Time   NA 138 10/30/2013 0400   K 3.7 10/30/2013 0400   CL 100 10/30/2013 0400   CO2 30 10/30/2013 0400   GLUCOSE 108* 10/30/2013 0400   BUN 3* 10/30/2013 0400   CREATININE 0.83 10/30/2013 0400   CALCIUM 8.9 10/30/2013 0400   PROT 5.8* 10/28/2013 0835   ALBUMIN 2.8* 10/28/2013 0835   AST 27 10/28/2013 0835   ALT 45* 10/28/2013 0835   ALKPHOS 87 10/28/2013 0835   BILITOT 0.7 10/28/2013 0835   GFRNONAA 83* 10/30/2013 0400   GFRAA >90 10/30/2013 0400   Lipase     Component Value Date/Time   LIPASE 35 10/21/2013 1200       Studies/Results: Ct Abdomen Pelvis W Contrast  11/01/2013   CLINICAL DATA:  Nausea and vomiting. Prior gastric dissection for gastric cancer.  EXAM: CT ABDOMEN AND PELVIS WITH CONTRAST  TECHNIQUE: Multidetector CT imaging of the abdomen and pelvis was performed using the standard protocol following bolus administration of intravenous contrast.  CONTRAST:  1 OMNIPAQUE IOHEXOL  300 MG/ML SOLN, 159mL OMNIPAQUE IOHEXOL 300 MG/ML SOLN  COMPARISON:  Multiple exams, including 09/13/2013  FINDINGS: Lower chest: Stable interstitial thickening, mild volume loss, and potentially bronchiectasis in the lateral basal segment right lower lobe.  Hepatobiliary: 1.1 x 1.0 cm hypodense lesion likely with internal complexity or septation, segment 2 of the liver, image 11 series 2. Contracted gallbladder.  Spleen: Intact  Pancreas: Intact  Stomach/Bowel: Gastrostomy tube noted. There appears to be a GJ extension. The stomach is partially resected. The gastric section is new compared to 7/20 03/2013. Stranding in the surrounding omentum and subxiphoid region from prior laparotomy. I do not observe an abscess around the operative site. No locally recurrent malignancy is readily apparent. Appendix normal. Orally administered contrast extends through to the rectum.  Adrenals/urinary tract: Stable 5 mm hypodense lesion medially in the right kidney upper pole, likely a cyst but technically too small to characterize. Otherwise negative.  Vascular/Lymphatic: Unremarkable  Reproductive: Uterine fibroids.  Ovaries unremarkable.  Muskuloskeletal: Transitional lumbosacral vertebra probably represents L5.  Other: None  IMPRESSION: 1. Postoperative findings from recent partial gastrectomy. 2. 1.0 x 1.1 cm hypodense lesion in segment 2 of the liver. This lesion is not entirely simple in appears to have internal septation. An arterial phase enhancing 9 x 8 mm lesion is seen in this location on  the CT chest from 09/13/2013, but there is no visible lesion differentiable from the hepatic parenchyma on the portal venous and delayed phase images of the CT abdomen from 09/11/2013. Differential diagnostic considerations include hypervascular metastatic lesion, focal nodular hyperplasia, and a small hepatic adenoma. Imaging workup options include hepatic protocol MRI and nuclear medicine PET-CT. 3. Ancillary findings include stable  abnormal interstitial thickening in the right lower lobe and uterine fibroids.   Electronically Signed   By: Sherryl Barters M.D.   On: 11/01/2013 17:42    Anti-infectives: Anti-infectives   None       Assessment/Plan Gastric Adenocarcionoma Stage III S/P gastrectomy with BII - Dr. 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 UGI neg on 9/2. No evidence of gastric emptying problems. CT shows no abscess and contrast passes through to rectum showing no anatomic or physiologic cause for her vomiting.  Pt refuses supplementation and not getting sufficient PO. She is refusing tube feeding on and off. She is tolerating some D2 diet, but still having vomiting.    2 Advance diet as tolerated, can re-attempt TF if she is agreeable.  Goals of care meeting today with palliative. 3 Limit narcotics unless this become a goal of palliative. 4 Continue antiemetics, ice, heat therapy  6. Se can be discharged soon since no evidence of delayed gastric emptying or obstruction. Her mental health issues are definitely affecting her medical status.  No other surgical recommendations as she seems to be doing well from this perspective.   7. Flush gastrojejunostomy tube (BOTH Gastric & jejunal ports) with 30 mL of warm tap water air every 6 hours to prevent the tube from being clogged.  No medicines through a jejunostomy port.  If need to give medicines through the tube because the patient cannot or refuses to take it orally, provide in elixir form or crushed through gastric port. 8. We will see her again on Monday if she's still here, but okay to d/c from our perspective    LOS: 11 days    Rosario Adie, MD  [facility]

## 2013-11-02 NOTE — Progress Notes (Signed)
Progress Note   Paula [name] MBT:[id number] DOB: 05/15/1965 DOA: 10/22/2013 PCP: Arnoldo Morale, MD   Brief Narrative:   Paula [name] is an 48 y.o. female initially admitted 09/14/13 with one year history of nausea, vomiting and epigastric pain. EGD by Dr. 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 On 09/20/13, she underwent diagnostic laparoscopy, distal gastrectomy with Billroth II anastomosis and feeding GJ tube by Dr. Barry Dienes. UGI on 10/02/13 showed normal post gastrojejunostomy without perforation. Since then she has had problems with by mouth and tube feeding and has had multiple admissions for abdominal pain, nausea and vomiting. During one such admission, palliative consulted and patient wished to continue full curative treatment. Dr. [name] saw patient on 09/14/13 and advised her that she has at least stage III gastric cancer and would definitely need discussion of adjuvant treatment including chemotherapy and XRT-during outpatient followup. She was readmitted 10/22/13 with intractable nausea and vomiting. Despite extensive workup, she is still unable to consistently keep significant amount by mouth or via tube feeds. CCS following. 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   No mechanical causes for her symptoms based on extensive testing in the past-UGI -10/02/13. Repeat CT scan done 11/01/13 shows no evidence of obstruction or other complications at surgical site, and contrast extends through to the rectum.  Has been unable to consistently tolerate either by mouth or tube feeding since surgery 09/20/13.      GI was consulted and felt that her stomach was probably volume sensitive & had no further recommendations. GJ tube was changed on 10/27/13 secondary to leak.   Psychiatry consulted to evaluate for psychogenic etiology and she was started on Prozac and Zyprexa to address symptoms of depression.   Surgery continues to follow and have maximized Reglan, clamping G-tube daily, continue J-tube feeding and bowel regimen for constipation. Patient has been refusing her tube feeds however. Diet advanced dysphasia to 10/30/13, but she continues to suffer with nausea/vomiting.  Continue PPI. Start TNA given ongoing intolerance to enteral feeding.  Palliative care consultation scheduled for today to address symptom management/goals of care.  Active Problems: Chronic back pain  The patient's ongoing back pain and need for IV Dilaudid-HP for pain control is puzzling. An MRI of her lumbar spine done 10/11/13 was negative for fracture or metastatic disease with normal bone marrow signal.  She has had frequent rehospitalizations for management of this pain, but is now willing to speak with the palliative care team for pain management. If we do not get her pain adequately controlled, I fear she will continue to return to the hospital for pain relief.  Unable to tolerate switch to oral Dilaudid-HP for pain control. We'll need to discuss with palliative care and ongoing pain management strategy, possibly a home infusion pump.  Gastric cancer, at least stage III  Discussed with Dr. [name] on 10/23/13 who recommends outpatient followup when GI issues have settled for discussion regarding further cancer treatment.   CT head negative for metastasis.   Radiation oncology input appreciated, 5 weeks of radiation planned once symptoms adequately controlled.   Repeat CT scan of abdomen and pelvis 11/01/13 shows no obvious evidence of metastatic disease, and no abnormalities that would explain her symptoms,  although there  was a hypodense lesion in the liver.  Will touch base with Dr. [name] on 11/04/13.  Chronic Anemia  No reported bleeding. Hb stable.   Hypertension  Controlled. Continue carvedilol.   History of GERD  Continue PPI.   Severe malnutrition in the context of chronic illness  Seen by dietitian 10/29/13 with recommendations to resume tube feedings at 25 mL/hour as tolerated. Has been refusing.  Weight down 14 pounds since 10/22/13.  Confusion  As per nursing, patient has been intermittently confused and somnolent at times.  No evidence of brain metastasis on head CT.  Minimize sedative medications-narcotics and Ativan. Ongoing sedation likely from initiation of therapy with Zyprexa.  Hypokalemia  Replaced.  DVT Prophylaxis  Continue Lovenox.  Code Status: Full. Family Communication: Extended family updated at the bedside in consultation with Irena Reichmann, NP of palliative care. Disposition Plan: Home when stable.   IV Access:    Peripheral IV   Procedures and diagnostic studies:    GJ tube was changed by IR on 10/27/13.   Medical Consultants:    Dr. Excell Seltzer, General surgery   Dr. Anson Fret, Gastroenterology   Dr. Eppie Gibson, Radiation oncology   Interventional radiology   Dr. Zorita Pang, Psychiatry  Irena Reichmann, NP, Palliative care    Other Consultants:    Dietitian   Anti-Infectives:    None.  Subjective:   Paula [name] continues to report nausea and abdominal pain.  Her G-tube is currently clamped, asked the nurses to clamping overnight because it kept becoming dislodged and soiling the bed.   Objective:    Filed Vitals:   11/01/13 1816 11/01/13 2043 11/02/13 0447 11/02/13 0643  BP: 100/60 125/77 100/60   Pulse: 98 98 103   Temp:  97.7 F (36.5 C) 97.9 F (36.6 C)   TempSrc:  Axillary Axillary   Resp:  16 16   Height:      Weight:    59.648 kg (131 lb 8 oz)  SpO2:  100% 100%     Intake/Output  Summary (Last 24 hours) at 11/02/13 0817 Last data filed at 11/01/13 2044  Gross per 24 hour  Intake     63 ml  Output    125 ml  Net    -62 ml    Exam: Gen:  NAD Psych: Depressed affect Cardiovascular:  RRR, No M/R/G Respiratory:  Lungs CTAB Gastrointestinal:  Abdomen soft, tender with positive bowel sounds Extremities:  No C/E/C   Data Reviewed:    Labs: Basic Metabolic Panel:  Recent Labs Lab 10/28/13 0835 10/28/13 0840 10/29/13 0350 10/30/13 0400  NA 139  --  138 138  K 3.4*  --  3.5* 3.7  CL 99  --  101 100  CO2 29  --  28 30  GLUCOSE 128*  --  132* 108*  BUN 3*  --  3* 3*  CREATININE 0.74  --  0.78 0.83  CALCIUM 8.5  --  8.7 8.9  MG  --  1.5  --  1.6   GFR Estimated Creatinine Clearance: 68.3 ml/min (by C-G formula based on Cr of 0.83). Liver Function Tests:  Recent Labs Lab 10/28/13 0835  AST 27  ALT 45*  ALKPHOS 87  BILITOT 0.7  PROT 5.8*  ALBUMIN 2.8*    Recent Labs Lab 10/28/13 0835  AMMONIA 23   CBC:  Recent Labs Lab 10/28/13 0835 10/30/13 0400  WBC 5.3 4.0  HGB 8.7* 8.3*  HCT 27.6* 26.4*  MCV 80.5 79.3  PLT 261 263   CBG:  Recent Labs Lab 11/01/13 1711 11/01/13 2018 11/01/13 2353 11/02/13 0445 11/02/13 0752  GLUCAP 110* 108* 99 93 89   Microbiology No results found for this or any previous visit (from the past 240 hour(s)).   Medications:   . carvedilol  6.25 mg Oral BID WC  . cloNIDine  0.2 mg Transdermal Weekly  . enoxaparin (LOVENOX) injection  40 mg Subcutaneous Q24H  . feeding supplement (ENSURE COMPLETE)  237 mL Oral BID BM  . feeding supplement (OSMOLITE 1.5 CAL)  1,000 mL Per Tube Q24H  . FLUoxetine  10 mg Oral Daily  . lip balm  1 application Topical BID  . LORazepam  0.5 mg Oral TID  . metoCLOPramide  10 mg Oral TID AC & HS  . OLANZapine zydis  5 mg Oral QHS  . pantoprazole  40 mg Oral BID AC  . polyethylene glycol  17 g Oral BID  . scopolamine  1 patch Transdermal Q72H  . sodium chloride  3 mL  Intravenous Q12H   Continuous Infusions: . sodium chloride Stopped (10/28/13 1528)    Time spent: 35 minutes with > 50% of time discussing current diagnostic test results, clinical impression and plan of care with the patient and her family.   LOS: 11 days   [facility] Pager [phone]. If unable to reach me by pager, please call my cell phone at [phone].  *Please refer to [email or web address], password [email or web address] to get updated schedule on who will round on this patient, as hospitalists switch teams weekly. If 7PM-7AM, please contact night-coverage at [email or web address], password [email or web address] for any overnight needs.  11/02/2013, 8:17 AM

## 2013-11-02 NOTE — Progress Notes (Signed)
PARENTERAL NUTRITION CONSULT NOTE - INITIAL  Pharmacy Consult for TNA Indication: intolerance to enteral feedings  No Known Allergies  Patient Measurements: Height: 5' (152.4 cm) Weight: 131 lb 8 oz (59.648 kg) IBW/kg (Calculated) : 45.5  Vital Signs: Temp: 97.9 F (36.6 C) (09/12 0447) Temp src: Axillary (09/12 0447) BP: 100/60 mmHg (09/12 0447) Pulse Rate: 103 (09/12 0447) Intake/Output from previous day: 09/11 0701 - 09/12 0700 In: 63 [I.V.:3] Out: 125 [Drains:125] Intake/Output from this shift: Total I/O In: 3 [I.V.:3] Out: -   Labs: No results found for this basename: WBC, HGB, HCT, PLT, APTT, INR,  in the last 72 hours  No results found for this basename: NA, K, CL, CO2, GLUCOSE, BUN, CREATININE, LABCREA, CREAT24HRUR, CALCIUM, MG, PHOS, PROT, ALBUMIN, AST, ALT, ALKPHOS, BILITOT, BILIDIR, IBILI, PREALBUMIN, TRIG, CHOLHDL, CHOL,  in the last 72 hours Estimated Creatinine Clearance: 68.3 ml/min (by C-G formula based on Cr of 0.83).    Recent Labs  11/01/13 2353 11/02/13 0445 11/02/13 0752  GLUCAP 99 93 89    Medical History: Past Medical History  Diagnosis Date  . Headache(784.0) 2011    Migraines  . Esophageal reflux     On omeprazole  . Costochondritis   . Hypertension     started around 2011  . Ovarian cyst   . Stomach cancer     Medications:  Scheduled:  . carvedilol  6.25 mg Oral BID WC  . cloNIDine  0.2 mg Transdermal Weekly  . enoxaparin (LOVENOX) injection  40 mg Subcutaneous Q24H  . feeding supplement (ENSURE COMPLETE)  237 mL Oral BID BM  . feeding supplement (OSMOLITE 1.5 CAL)  1,000 mL Per Tube Q24H  . FLUoxetine  10 mg Oral Daily  . lip balm  1 application Topical BID  . LORazepam  0.5 mg Oral TID  . metoCLOPramide  10 mg Oral TID AC & HS  . OLANZapine zydis  5 mg Oral QHS  . pantoprazole  40 mg Oral BID AC  . polyethylene glycol  17 g Oral BID  . scopolamine  1 patch Transdermal Q72H  . sodium chloride  3 mL Intravenous Q12H    Infusions:  . sodium chloride Stopped (10/28/13 1528)    Insulin Requirements in the past 24 hours:  No insulin ordered  Current Nutrition:  Dys 2 diet- minimal intake TFs ordered but not started Ensure complete ordered, last charted 9/9  IVF: NS turned to OFF on 9/7  Assessment: 48 yo F admitted 09/14/13 with hx N/V/epigastric pain. Diagnosed with at least stage III gastric cancer, with discussion needed regarding adjuvant chemo/XRT as outpatient follow-up. She was readmitted 10/22/13 with intractable N/V. Per MD note, has been unable to consistently tolerate either PO or TF since surgery 09/20/13. GJ tube changed 9/6 due to leak. Surgery is following. Patient continued to refuse her tube feeds. Weight is down 14 pounds since 10/22/13. Diet was advanced to dysphagia diet on 9/9 but she continues to have N/V.   Pharmacy consulted to start TNA on 9/11 given ongoing intolerance to enteral feeding. RN called pharmacy to inform us PICC line was not placed. Plans for placement 9/12 in the afternoon.   Labs: last labs checked 9/9 Electrolytes: WNL Renal Function: WNL Hepatic Function: ALT slightly elevated on 9/7, other LFTs WNL Pre-Albumin: 17.2 (9/1) Triglycerides: 129 (8/22) CBGs: all values < 150   TNA day#: 1 TNA access: PICC (to be placed today, 9/12)   Nutritional Goals:  Kcal: 1800-2000  Protein: 85-95 gram  Fluid: >/=1800 ml/d  Clinimix E5/15 at goal rate of 80 ml/hr + lipids 20% at 10 mL/hr would provide 96g protein and 1843 kcal/day   Plan: at 1800 if PICC is placed - start Clinimix E5/15 at 40 ml/hr - start 20% fat emulsion at 10 ml/hr - TNA to contain MVI and trace elements daily - start sensitive SSI and CBG checks q6h - TNA lab panel today, in AM, and qMondays & Thursdays  - patient at high risk for refeeding syndrome with prolonged decreased PO intake - follow- up insertion of PICC - pharmacy will follow-up daily  Thank you for the consult.  Currie Paris, PharmD, BCPS Pager: [phone] Pharmacy: [phone] 11/02/2013 11:26 AM

## 2013-11-03 DIAGNOSIS — N179 Acute kidney failure, unspecified: Secondary | ICD-10-CM

## 2013-11-03 LAB — COMPREHENSIVE METABOLIC PANEL
ALK PHOS: 92 U/L (ref 39–117)
ALT: 44 U/L — ABNORMAL HIGH (ref 0–35)
AST: 16 U/L (ref 0–37)
Albumin: 3.5 g/dL (ref 3.5–5.2)
Anion gap: 15 (ref 5–15)
BUN: 23 mg/dL (ref 6–23)
CO2: 25 mEq/L (ref 19–32)
Calcium: 9.4 mg/dL (ref 8.4–10.5)
Chloride: 92 mEq/L — ABNORMAL LOW (ref 96–112)
Creatinine, Ser: 1.25 mg/dL — ABNORMAL HIGH (ref 0.50–1.10)
GFR calc non Af Amer: 51 mL/min — ABNORMAL LOW (ref >90)
GFR, EST AFRICAN AMERICAN: 59 mL/min — AB (ref >90)
GLUCOSE: 104 mg/dL — AB (ref 70–99)
POTASSIUM: 3.4 meq/L — AB (ref 3.7–5.3)
Sodium: 132 mEq/L — ABNORMAL LOW (ref 137–147)
TOTAL PROTEIN: 7 g/dL (ref 6.0–8.3)
Total Bilirubin: 0.7 mg/dL (ref 0.3–1.2)

## 2013-11-03 LAB — GLUCOSE, CAPILLARY
GLUCOSE-CAPILLARY: 107 mg/dL — AB (ref 70–99)
GLUCOSE-CAPILLARY: 98 mg/dL (ref 70–99)
Glucose-Capillary: 104 mg/dL — ABNORMAL HIGH (ref 70–99)
Glucose-Capillary: 114 mg/dL — ABNORMAL HIGH (ref 70–99)

## 2013-11-03 LAB — MAGNESIUM: Magnesium: 2 mg/dL (ref 1.5–2.5)

## 2013-11-03 LAB — TRIGLYCERIDES: Triglycerides: 121 mg/dL (ref <150)

## 2013-11-03 LAB — PHOSPHORUS: Phosphorus: 5.2 mg/dL — ABNORMAL HIGH (ref 2.3–4.6)

## 2013-11-03 LAB — PREALBUMIN: Prealbumin: 16.5 mg/dL — ABNORMAL LOW (ref 17.0–34.0)

## 2013-11-03 MED ORDER — SODIUM CHLORIDE 0.9 % IV BOLUS (SEPSIS)
500.0000 mL | Freq: Once | INTRAVENOUS | Status: AC
  Administered 2013-11-03: 500 mL via INTRAVENOUS

## 2013-11-03 MED ORDER — POTASSIUM CHLORIDE IN NACL 40-0.9 MEQ/L-% IV SOLN
INTRAVENOUS | Status: DC
  Administered 2013-11-03 – 2013-11-04 (×2): 75 mL/h via INTRAVENOUS
  Filled 2013-11-03 (×3): qty 1000

## 2013-11-03 MED ORDER — MENTHOL 3 MG MT LOZG
1.0000 | LOZENGE | OROMUCOSAL | Status: DC | PRN
  Filled 2013-11-03: qty 9

## 2013-11-03 MED ORDER — POTASSIUM CHLORIDE 10 MEQ/100ML IV SOLN
10.0000 meq | INTRAVENOUS | Status: AC
  Administered 2013-11-03 (×2): 10 meq via INTRAVENOUS
  Filled 2013-11-03 (×4): qty 100

## 2013-11-03 NOTE — Progress Notes (Signed)
Progress Note   Paula [name] HKV:[id number] DOB: 04-30-1965 DOA: 10/22/2013 PCP: Arnoldo Morale, MD   Brief Narrative:   Paula [name] is an 48 y.o. female initially admitted 09/14/13 with one year history of nausea, vomiting and epigastric pain. EGD by Dr. 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 On 09/20/13, she underwent diagnostic laparoscopy, distal gastrectomy with Billroth II anastomosis and feeding GJ tube by Dr. Barry Dienes. UGI on 10/02/13 showed normal post gastrojejunostomy without perforation. Since then she has had problems with by mouth and tube feeding and has had multiple admissions for abdominal pain, nausea and vomiting. During one such admission, palliative consulted and patient wished to continue full curative treatment. Dr. [name] saw patient on 09/14/13 and advised her that she has at least stage III gastric cancer and would definitely need discussion of adjuvant treatment including chemotherapy and XRT-during outpatient followup. She was readmitted 10/22/13 with intractable nausea and vomiting. Despite extensive workup, she is still unable to consistently keep significant amount by mouth or via tube feeds. CCS following. 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   No mechanical causes for her symptoms based on extensive testing in the past-UGI -10/02/13. Repeat CT scan done 11/01/13 shows no evidence of obstruction or other complications at surgical site, and contrast extends through to the rectum.  Has been unable to consistently tolerate either by mouth or tube feeding since surgery 09/20/13.      GI was consulted and felt that her stomach was probably volume sensitive & had no further recommendations. GJ tube was changed on 10/27/13 secondary to leak. J-tube now not flushing. IR consultation for evaluation.  Psychiatry consulted to evaluate for psychogenic etiology and she was started on Prozac and Zyprexa to address symptoms of depression.   Surgery continues to follow and have maximized Reglan, clamping G-tube daily, continue J-tube feeding and bowel regimen for constipation. Patient has been refusing her tube feeds however. Diet advanced dysphasia to 10/30/13, but she continues to suffer with nausea/vomiting.  Continue PPI. IV team unable to place PICC line.  IR to place.  Start TNA when PICC placed given ongoing intolerance to enteral feeding.  Seen by palliative care team for goals of care/symptom management.  Active Problems: Acute kidney injury  Creatinine bump noted 11/03/13. Likely secondary to dehydration. Increase IV fluids to 75 cc per hour.  Chronic back pain  The patient's ongoing back pain and need for IV Dilaudid-HP for pain control is puzzling. An MRI of her lumbar spine done 10/11/13 was negative for fracture or metastatic disease with normal bone marrow signal.  She has had frequent rehospitalizations for management of this pain, but is now willing to speak with the palliative care team for pain management. If we do not get her pain adequately controlled, I fear she will continue to return to the hospital for pain relief.  Unable to tolerate switch to oral Dilaudid-HP for pain control. We'll need to discuss with palliative care and ongoing pain management strategy, possibly a home infusion pump.  Gastric cancer, at least stage III  Discussed with Dr. [name] on 10/23/13 who recommends outpatient followup when GI issues have settled for discussion regarding further cancer treatment.   CT head negative for  metastasis.   Radiation oncology input appreciated, 5 weeks  of radiation planned once symptoms adequately controlled.   Repeat CT scan of abdomen and pelvis 11/01/13 shows no obvious evidence of metastatic disease, and no abnormalities that would explain her symptoms, although there was a hypodense lesion in the liver.  Will touch base with Dr. [name] on 11/04/13.  Chronic Anemia  No reported bleeding. Hb stable.   Hypertension  Controlled. Continue carvedilol.   History of GERD  Continue PPI.   Severe malnutrition in the context of chronic illness  Seen by dietitian 10/29/13 with recommendations to resume tube feedings at 25 mL/hour as tolerated. Unable to tolerate.  Weight down 14 pounds since 10/22/13.  Confusion  As per nursing, patient has been intermittently confused and somnolent at times.  No evidence of brain metastasis on head CT.  Minimize sedative medications-narcotics and Ativan. Ongoing sedation likely from initiation of therapy with Zyprexa.  Hypokalemia  We'll give 4 runs of potassium today.  DVT Prophylaxis  Continue Lovenox.  Code Status: Full. Family Communication: No family at the bedside today. Disposition Plan: Home when stable.   IV Access:    Peripheral IV   Procedures and diagnostic studies:    GJ tube was changed by IR on 10/27/13.   Medical Consultants:    Dr. Excell Seltzer, General surgery   Dr. Anson Fret, Gastroenterology   Dr. Eppie Gibson, Radiation oncology   Interventional radiology   Dr. Zorita Pang, Psychiatry  Irena Reichmann, NP, Palliative care    Other Consultants:    Dietitian   Anti-Infectives:    None.  Subjective:   Paula [name] continues to report nausea and abdominal pain/back pain.  Has been using approximately 2 doses of IV Dilaudid-HP for pain control/shift. GJ tube currently hooked up to drainage with bile in the drainage bag. Told her she had new blurry vision, but knowledge is that this has been long-standing and related to poor  eyesight when I spoke to her about this. Received a 500 cc bolus of normal saline overnight secondary to hypotension.  Objective:    Filed Vitals:   11/02/13 0643 11/02/13 1332 11/02/13 2120 11/03/13 0544  BP:  109/95 102/63 84/56  Pulse:  103 112 94  Temp:  98.8 F (37.1 C) 98.1 F (36.7 C) 98 F (36.7 C)  TempSrc:  Oral Oral Oral  Resp:  16 16 16   Height:      Weight: 59.648 kg (131 lb 8 oz)     SpO2:  100% 100% 99%    Intake/Output Summary (Last 24 hours) at 11/03/13 0750 Last data filed at 11/02/13 2151  Gross per 24 hour  Intake      3 ml  Output    400 ml  Net   -397 ml    Exam: Gen:  NAD Psych: Depressed affect Cardiovascular:  RRR, No M/R/G Respiratory:  Lungs CTAB Gastrointestinal:  Abdomen soft, tender with positive bowel sounds Extremities:  No C/E/C   Data Reviewed:    Labs: Basic Metabolic Panel:  Recent Labs Lab 10/28/13 0835 10/28/13 0840 10/29/13 0350 10/30/13 0400 11/02/13 1141 11/03/13 0433  NA 139  --  138 138 134* 132*  K 3.4*  --  3.5* 3.7 3.6* 3.4*  CL 99  --  101 100 93* 92*  CO2 29  --  28 30 25 25   GLUCOSE 128*  --  132* 108* 97 104*  BUN 3*  --  3* 3* 19 23  CREATININE 0.74  --  0.78 0.83 1.06 1.25*  CALCIUM 8.5  --  8.7 8.9 9.7 9.4  MG  --  1.5  --  1.6 2.0 2.0  PHOS  --   --   --   --  4.3 5.2*   GFR Estimated Creatinine Clearance: 45.4 ml/min (by C-G formula based on Cr of 1.25). Liver Function Tests:  Recent Labs Lab 10/28/13 0835 11/02/13 1141 11/03/13 0433  AST 27 15 16   ALT 45* 54* 44*  ALKPHOS 87 94 92  BILITOT 0.7 0.6 0.7  PROT 5.8* 7.3 7.0  ALBUMIN 2.8* 3.6 3.5    Recent Labs Lab 10/28/13 0835  AMMONIA 23   CBC:  Recent Labs Lab 10/28/13 0835 10/30/13 0400 11/02/13 1141  WBC 5.3 4.0 5.5  NEUTROABS  --   --  3.2  HGB 8.7* 8.3* 10.6*  HCT 27.6* 26.4* 33.6*  MCV 80.5 79.3 78.5  PLT 261 263 352   CBG:  Recent Labs Lab 11/02/13 0752 11/02/13 1222 11/02/13 1839 11/02/13 2337  11/03/13 0546  GLUCAP 89 95 81 105* 107*   Microbiology No results found for this or any previous visit (from the past 240 hour(s)).   Medications:   . carvedilol  6.25 mg Oral BID WC  . cloNIDine  0.2 mg Transdermal Weekly  . enoxaparin (LOVENOX) injection  40 mg Subcutaneous Q24H  . feeding supplement (ENSURE COMPLETE)  237 mL Oral BID BM  . feeding supplement (OSMOLITE 1.5 CAL)  1,000 mL Per Tube Q24H  . FLUoxetine  10 mg Oral Daily  . insulin aspart  0-9 Units Subcutaneous 4 times per day  . lip balm  1 application Topical BID  . LORazepam  0.5 mg Intravenous TID  . metoCLOPramide  10 mg Oral TID AC & HS  . OLANZapine zydis  5 mg Oral QHS  . pantoprazole  40 mg Oral BID AC  . polyethylene glycol  17 g Oral BID  . scopolamine  1 patch Transdermal Q72H  . sodium chloride  500 mL Intravenous Once  . sodium chloride  3 mL Intravenous Q12H   Continuous Infusions: . sodium chloride Stopped (10/28/13 1528)    Time spent: 25 minutes.   LOS: 12 days   [facility] Pager [phone]. If unable to reach me by pager, please call my cell phone at [phone].  *Please refer to [email or web address], password [email or web address] to get updated schedule on who will round on this patient, as hospitalists switch teams weekly. If 7PM-7AM, please contact night-coverage at [email or web address], password [email or web address] for any overnight needs.  11/03/2013, 7:50 AM

## 2013-11-03 NOTE — Progress Notes (Signed)
PARENTERAL NUTRITION CONSULT NOTE - Follow up  Pharmacy Consult for TNA Indication: intolerance to enteral feedings  No Known Allergies  Patient Measurements: Height: 5' (152.4 cm) Weight: 131 lb 8 oz (59.648 kg) IBW/kg (Calculated) : 45.5  Vital Signs: Temp: 98 F (36.7 C) (09/13 0544) Temp src: Oral (09/13 0544) BP: 84/56 mmHg (09/13 0544) Pulse Rate: 94 (09/13 0544) Intake/Output from previous day: 09/12 0701 - 09/13 0700 In: 3 [I.V.:3] Out: 400 [Drains:400] Intake/Output from this shift:    Labs:  Recent Labs  11/02/13 1141  WBC 5.5  HGB 10.6*  HCT 33.6*  PLT 352     Recent Labs  11/02/13 1141 11/03/13 0433  NA 134* 132*  K 3.6* 3.4*  CL 93* 92*  CO2 25 25  GLUCOSE 97 104*  BUN 19 23  CREATININE 1.06 1.25*  CALCIUM 9.7 9.4  MG 2.0 2.0  PHOS 4.3 5.2*  PROT 7.3 7.0  ALBUMIN 3.6 3.5  AST 15 16  ALT 54* 44*  ALKPHOS 94 92  BILITOT 0.6 0.7  TRIG  --  121   Estimated Creatinine Clearance: 45.4 ml/min (by C-G formula based on Cr of 1.25).    Recent Labs  11/02/13 1839 11/02/13 2337 11/03/13 0546  GLUCAP 81 105* 107*    Medical History: Past Medical History  Diagnosis Date  . Headache(784.0) 2011    Migraines  . Esophageal reflux     On omeprazole  . Costochondritis   . Hypertension     started around 2011  . Ovarian cyst   . Stomach cancer     Medications:  Scheduled:  . carvedilol  6.25 mg Oral BID WC  . cloNIDine  0.2 mg Transdermal Weekly  . enoxaparin (LOVENOX) injection  40 mg Subcutaneous Q24H  . feeding supplement (ENSURE COMPLETE)  237 mL Oral BID BM  . feeding supplement (OSMOLITE 1.5 CAL)  1,000 mL Per Tube Q24H  . FLUoxetine  10 mg Oral Daily  . insulin aspart  0-9 Units Subcutaneous 4 times per day  . lip balm  1 application Topical BID  . LORazepam  0.5 mg Intravenous TID  . metoCLOPramide  10 mg Oral TID AC & HS  . OLANZapine zydis  5 mg Oral QHS  . pantoprazole  40 mg Oral BID AC  . polyethylene glycol  17 g  Oral BID  . potassium chloride  10 mEq Intravenous Q1 Hr x 4  . scopolamine  1 patch Transdermal Q72H  . sodium chloride  3 mL Intravenous Q12H   Infusions:  . sodium chloride 75 mL/hr at 11/03/13 0754    Insulin Requirements in the past 24 hours:  0 units of sensitive SSI used/24h  Current Nutrition:  Dys 2 diet- minimal intake TFs ordered 9/10 but not started Ensure complete ordered, last charted 9/9  IVF: NS @ 75 ml/hr started this AM  Assessment: 48 yo F admitted 09/14/13 with hx N/V/epigastric pain. Diagnosed with at least stage III gastric cancer, with discussion needed regarding adjuvant chemo/XRT as outpatient follow-up. She was readmitted 10/22/13 with intractable N/V. Per MD note, has been unable to consistently tolerate either PO or TF since surgery 09/20/13. GJ tube changed 9/6 due to leak. Surgery is following. Patient continued to refuse her tube feeds. Weight is down 14 pounds since 10/22/13. Diet was advanced to dysphagia diet on 9/9 but she continues to have N/V.   Significant events:  9/11: Pharmacy consulted to start TNA, PICC line was not placed.  9/12:  PICC placement unsuccessful, TNA not started 9/13: Plans for PICC placement in IR   Labs:  Electrolytes: Na low at 132 (unable to adjust in premixed TNA), K low at 3.4 (noted replacement by MD), Phos increased to 5.2, all others WNL Renal Function: SCr increased to 1.25, CrCl 45 ml/min Hepatic Function: now WNL Pre-Albumin: 17.2 (9/1) Triglycerides: 121 (9/13) CBGs: all values < 150   TNA day#: 1 TNA access: PICC (to be placed today, 9/13)   Nutritional Goals:  Kcal: 1800-2000  Protein: 85-95 gram  Fluid: >/=1800 ml/d  Clinimix E5/15 at goal rate of 80 ml/hr + lipids 20% at 10 mL/hr would provide 96g protein and 1843 kcal/day   Plan: at 1800 if PICC is placed - due to increased Phos: start Clinimix 5/15 at 40 ml/hr (without electrolytes) - start 20% fat emulsion at 10 ml/hr - TNA to contain MVI and  trace elements daily - continue sensitive SSI and CBG checks q6h - TNA lab panel qMondays & Thursdays  - patient at high risk for refeeding syndrome with prolonged decreased PO intake - continue NS @ 75 ml/hr per MD - follow- up insertion of PICC - pharmacy will follow-up daily  Thank you for the consult.  Currie Paris, PharmD, BCPS Pager: [phone] Pharmacy: [phone] 11/03/2013 11:32 AM

## 2013-11-03 NOTE — Progress Notes (Signed)
Flushed patient's g port of JG tube with 38mL of warm water.

## 2013-11-04 ENCOUNTER — Telehealth: Payer: Self-pay | Admitting: Internal Medicine

## 2013-11-04 ENCOUNTER — Inpatient Hospital Stay (HOSPITAL_COMMUNITY): Payer: No Typology Code available for payment source

## 2013-11-04 DIAGNOSIS — R112 Nausea with vomiting, unspecified: Secondary | ICD-10-CM | POA: Insufficient documentation

## 2013-11-04 DIAGNOSIS — R1032 Left lower quadrant pain: Secondary | ICD-10-CM

## 2013-11-04 DIAGNOSIS — Z515 Encounter for palliative care: Secondary | ICD-10-CM

## 2013-11-04 LAB — COMPREHENSIVE METABOLIC PANEL
ALT: 77 U/L — AB (ref 0–35)
ANION GAP: 11 (ref 5–15)
AST: 65 U/L — ABNORMAL HIGH (ref 0–37)
Albumin: 2.9 g/dL — ABNORMAL LOW (ref 3.5–5.2)
Alkaline Phosphatase: 85 U/L (ref 39–117)
BUN: 11 mg/dL (ref 6–23)
CALCIUM: 8.7 mg/dL (ref 8.4–10.5)
CO2: 23 mEq/L (ref 19–32)
CREATININE: 0.79 mg/dL (ref 0.50–1.10)
Chloride: 100 mEq/L (ref 96–112)
GLUCOSE: 103 mg/dL — AB (ref 70–99)
Potassium: 4 mEq/L (ref 3.7–5.3)
SODIUM: 134 meq/L — AB (ref 137–147)
Total Bilirubin: 0.8 mg/dL (ref 0.3–1.2)
Total Protein: 5.8 g/dL — ABNORMAL LOW (ref 6.0–8.3)

## 2013-11-04 LAB — GLUCOSE, CAPILLARY
Glucose-Capillary: 98 mg/dL (ref 70–99)
Glucose-Capillary: 123 mg/dL — ABNORMAL HIGH (ref 70–99)

## 2013-11-04 LAB — DIFFERENTIAL
BASOS PCT: 0 % (ref 0–1)
Basophils Absolute: 0 10*3/uL (ref 0.0–0.1)
Eosinophils Absolute: 0.1 10*3/uL (ref 0.0–0.7)
Eosinophils Relative: 3 % (ref 0–5)
Lymphocytes Relative: 50 % — ABNORMAL HIGH (ref 12–46)
Lymphs Abs: 2.3 10*3/uL (ref 0.7–4.0)
MONO ABS: 0.5 10*3/uL (ref 0.1–1.0)
Monocytes Relative: 12 % (ref 3–12)
NEUTROS ABS: 1.7 10*3/uL (ref 1.7–7.7)
Neutrophils Relative %: 36 % — ABNORMAL LOW (ref 43–77)

## 2013-11-04 LAB — PREALBUMIN: Prealbumin: 15.2 mg/dL — ABNORMAL LOW (ref 17.0–34.0)

## 2013-11-04 LAB — TRIGLYCERIDES: TRIGLYCERIDES: 88 mg/dL (ref <150)

## 2013-11-04 LAB — MAGNESIUM: Magnesium: 1.9 mg/dL (ref 1.5–2.5)

## 2013-11-04 LAB — PHOSPHORUS: PHOSPHORUS: 3.6 mg/dL (ref 2.3–4.6)

## 2013-11-04 MED ORDER — ENSURE COMPLETE PO LIQD: 237.0000 mL | Freq: Two times a day (BID) | ORAL | Status: DC | PRN

## 2013-11-04 MED ORDER — HYDROMORPHONE HCL PF 1 MG/ML IJ SOLN: 1.0000 mg | INTRAMUSCULAR | Status: DC | PRN

## 2013-11-04 MED ORDER — HYDROMORPHONE HCL 1 MG/ML PO LIQD
1.0000 mg | ORAL | Status: DC | PRN
  Administered 2013-11-04 – 2013-11-05 (×3): 1 mg
  Filled 2013-11-04 (×3): qty 1

## 2013-11-04 MED ORDER — LORAZEPAM 1 MG PO TABS
1.0000 mg | ORAL_TABLET | Freq: Three times a day (TID) | ORAL | Status: DC
  Administered 2013-11-04 – 2013-11-05 (×4): 1 mg via ORAL
  Filled 2013-11-04 (×4): qty 1

## 2013-11-04 MED ORDER — OSMOLITE 1.2 CAL PO LIQD: 1000.0000 mL | ORAL | Status: DC

## 2013-11-04 NOTE — Progress Notes (Signed)
I have seen and examined the pt and agree with PA-Osborne's progress note.  

## 2013-11-04 NOTE — Progress Notes (Signed)
Patient ID: Paula [name], female   DOB: 05/24/1965, 48 y.o.   MRN: [id number]    Subjective: Pt not tolerating TFs, refusing them.  Just wants to stay on TNA.  Eating some diet, but g-tube to gravity.  Patient wants to go home.  Objective: Vital signs in last 24 hours: Temp:  [97.9 F (36.6 C)-98.6 F (37 C)] 98.5 F (36.9 C) (09/14 0537) Pulse Rate:  [88-90] 90 (09/14 0537) Resp:  [16] 16 (09/14 0537) BP: (89-104)/(59-62) 94/60 mmHg (09/14 0537) SpO2:  [98 %-100 %] 98 % (09/14 0537) Weight:  [136 lb 6.4 oz (61.871 kg)] 136 lb 6.4 oz (61.871 kg) (09/14 0537) Last BM Date: 10/28/13  Intake/Output from previous day: 09/13 0701 - 09/14 0700 In: 866.3 [P.O.:240; I.V.:626.3] Out: 100 [Drains:100] Intake/Output this shift:    PE: Abd: soft, GJ in place, g-tube to gravity.  Lab Results:   Recent Labs  11/02/13 1141 11/04/13 0410  WBC 5.5 4.6  HGB 10.6* 8.5*  HCT 33.6* 26.3*  PLT 352 282   BMET  Recent Labs  11/03/13 0433 11/04/13 0410  NA 132* 134*  K 3.4* 4.0  CL 92* 100  CO2 25 23  GLUCOSE 104* 103*  BUN 23 11  CREATININE 1.25* 0.79  CALCIUM 9.4 8.7   PT/INR No results found for this basename: LABPROT, INR,  in the last 72 hours CMP     Component Value Date/Time   NA 134* 11/04/2013 0410   K 4.0 11/04/2013 0410   CL 100 11/04/2013 0410   CO2 23 11/04/2013 0410   GLUCOSE 103* 11/04/2013 0410   BUN 11 11/04/2013 0410   CREATININE 0.79 11/04/2013 0410   CALCIUM 8.7 11/04/2013 0410   PROT 5.8* 11/04/2013 0410   ALBUMIN 2.9* 11/04/2013 0410   AST 65* 11/04/2013 0410   ALT 77* 11/04/2013 0410   ALKPHOS 85 11/04/2013 0410   BILITOT 0.8 11/04/2013 0410   GFRNONAA >90 11/04/2013 0410   GFRAA >90 11/04/2013 0410   Lipase     Component Value Date/Time   LIPASE 35 10/21/2013 1200       Studies/Results: No results found.  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 1. UGI neg on 9/2. No evidence of gastric emptying problems. Pt refuses TFs.  Will be transitioned just to TNA after PICC placement today. 2. Continue antiemetics, ice, heat therapy  3. Think she can be discharged soon since no evidence of gastric emptying. Her mental health issues are definitely affecting her medical status. No other surgical recommendations as she seems to be doing well from this perspective.  4. Flush gastrojejunostomy tube (BOTH Gastric & jejunal ports) with 30 mL of warm tap water air every 6 hours to prevent the tube from being clogged. No medicines through a jejunostomy port. If need to give medicines through the tube because the patient cannot or refuses to take it orally, provide in elixir form or crushed through gastric port.  5. She may follow up with Dr. Barry Dienes as an outpatient.  We will sign off.     LOS: 13 days    Kathelene Rumberger E 11/04/2013, 9:04 AM Pager: [phone]

## 2013-11-04 NOTE — Telephone Encounter (Signed)
Mary cld to get pt in sooner, r/s from 09/04 to 09/16 [facility] confirmed labs/ov .Marland KitchenMarland KitchenMarland KitchenMarland KitchenKJ

## 2013-11-04 NOTE — Progress Notes (Signed)
NUTRITION FOLLOW UP/TPN CONSULT  Intervention:   -TPN management per pharmacy; monitor refeeding labs and replete as warranted - pt poses high risk d/t hx of sub-optimal nutrition intake and significant weight loss -Encourage PO intake as tolerated -Discontinued TF, and modified supplement to PRN -RD to follow  Nutrition Dx:   Inadequate oral intake related to inability to eat as evidenced by NPO status   Goal:   TPN to meet >/= 90% of their estimated nutrition needs    Monitor:   TPN tolerance, total protein/energy intake, labs, weights, diet order, GI profile  Assessment:   9/01: -Diet recall indicated pt consuming snack-like meals- several bites of cereal w/milk, juices, yogurt, softened proteins (chicken), and ice cream. Was not consuming Unjury supplement as pt did not know where to procure them. Reinforced [facility] outpatient to purchase them upon d/c as warranted  -Educated pt on minimizing intake of high fat foods during episodes of nausea. Would also benefit from limiting high sugar foods d/t recent gastrectomy surgery and increased risk of dumping syndrome  -Endorsed ongoing weight loss of 20-30 lbs since 08/2013 d/t frequent hospitalizations and sub-optimal nutrition  -Pt currently NPO and bowel rest d/t ongoing nausea/vomiting. Consider nutrition support as pt unlikley able to sustain nutritional status via PO intake. Has GJ tube, pt has trialed tube feeding during previous admits; however pt was unable to tolerate and experienced nausea and vomiting even at low rate levels  -Pt with constipation; has not had BM since 8/29  9/02: -UGI completed, MD noted some reflux; however no post-operative anatomic findings to explain nausea or emesis -Pt with some nausea, but no emesis since 8/31 -GJ tube placement confirmed, plan to initiate TF via GJ tube and advance PO diet as tolerated -Last BM on 8/29, passing flatus -Phos/Mg/K WNL, monitor for refeeding risk d/t prolonged sub-optimal  nutrition intake and weight loss. Has hx of TF intolerance, will start at low dose and opt for higher calorically dense formula.  9/03: -Osmolite 1.5 initiated at 10 ml/hr, tolerated advancement to 20 ml/hr. Pt w/feelings of nausea and emesis when TF at 30 ml/hr -Surgery recommends to run TF at 20 ml/hr for 24 hours, and reassess advancement/tolerance. Will increase Pro-Stat to TID for additional protein while TF running at low rate -Diet advanced to clear liquids. Will order Unjury Chicken Broth once daily to provide more nutrient dense option  9/7: -G-J tube replaced as tube was clogged, orders for nothing but feeding to be put through j-port. -Osmolite 1.5 increased to 25 ml/hr per J-port. -Patient reports no nausea but G port to gravity. -Ate 1/4 grits and 1/8 applesauce this am. -Has not been getting Unjury and wants Ensure over ice which she likes. -Gastric cancer at least stage III noted. -Calorie count started per MD -Patient taking meds po or per g-tube.  Plus po food.  Are these being absorbed with G tube to gravity drain with high output?  9/14: -Pt underwent 48 hour calorie count 9/07-9/09; which indicated pt consuming < 50% estimated nutrition needs. PO intake + tube feeds at goal rate also unable to meet > 90% estimated nutrition needs. -Pt refusing tube feeds and Ensure Complete supplement. Will discontinue TF order (recommendations available to re-implement as needed) and modify supplement to PRN as tolerated. Pt resting and lethargic during time of RD follow up. -Continues to have nausea and vomiting. Gtube open drain with 100 ml output. Receiving Reglan -Recommend diet be upgraded to Dys2 to provide pt additional PO options.  Diet advanced to Dys2 on 9/09. Current PO intake < 25%. -Weight continues to decline, has lost approximately 5 lbs in one week (3.5% body weight loss, severe for time frame) -PICC line to be placed to initiate TPN d/t continued intolerance of TF and  sub-optimal PO intake. Monitor refeeding labs and replete as warranted -Per pharmacy note, plant to initiate Clinimix 5/15 at 40 ml/hr with goal of Clinimix E5/15 at 80 ml/hr with lipids 20% at 10 ml/hr to provide 1843 kcal (100% est kcal needs), and 96 gram protein (100% est protein needs) -Phos was elevated on 9/13; however is now WNL and Clinimix E5/15 formula would likely be appropriate to initiate -Phos/Mg/K WNL -CBGs at goal of < 150 mg/dl   Height: Ht Readings from Last 1 Encounters:  10/22/13 5' (1.524 m)    Weight Status:   Wt Readings from Last 1 Encounters:  11/04/13 136 lb 6.4 oz (61.871 kg)  10/28/12 141 lbs  Re-estimated needs:  Kcal: 1800-2000  Protein: 85-95 gram  Fluid: >/=1800 ml/daily   Skin: WDL, non-pitting RLE and LLE edema  Diet Order: Dysphagia 2 thin   Intake/Output Summary (Last 24 hours) at 11/04/13 0923 Last data filed at 11/04/13 0200  Gross per 24 hour  Intake 866.25 ml  Output    100 ml  Net 766.25 ml    Last BM: 9/07   Labs:   Recent Labs Lab 11/02/13 1141 11/03/13 0433 11/04/13 0410  NA 134* 132* 134*  K 3.6* 3.4* 4.0  CL 93* 92* 100  CO2 25 25 23   BUN 19 23 11   CREATININE 1.06 1.25* 0.79  CALCIUM 9.7 9.4 8.7  MG 2.0 2.0 1.9  PHOS 4.3 5.2* 3.6  GLUCOSE 97 104* 103*    CBG (last 3)   Recent Labs  11/03/13 1813 11/03/13 2355 11/04/13 0546  GLUCAP 98 114* 98    Scheduled Meds: . carvedilol  6.25 mg Oral BID WC  . cloNIDine  0.2 mg Transdermal Weekly  . enoxaparin (LOVENOX) injection  40 mg Subcutaneous Q24H  . feeding supplement (ENSURE COMPLETE)  237 mL Oral BID BM  . feeding supplement (OSMOLITE 1.5 CAL)  1,000 mL Per Tube Q24H  . FLUoxetine  10 mg Oral Daily  . insulin aspart  0-9 Units Subcutaneous 4 times per day  . lip balm  1 application Topical BID  . LORazepam  0.5 mg Intravenous TID  . metoCLOPramide  10 mg Oral TID AC & HS  . OLANZapine zydis  5 mg Oral QHS  . pantoprazole  40 mg Oral BID AC  .  polyethylene glycol  17 g Oral BID  . scopolamine  1 patch Transdermal Q72H  . sodium chloride  3 mL Intravenous Q12H    Continuous Infusions: . 0.9 % NaCl with KCl 40 mEq / L 75 mL/hr (11/04/13 0733)    Antonieta Iba, RD, LDN Clinical Inpatient Dietitian Pager:  [phone] Weekend and after hours pager:  [phone]

## 2013-11-04 NOTE — Progress Notes (Signed)
Clinical Social Work  CSW went to room to meet with patient. Patient sitting up in bed and texting on her phone when [place] arrived. Patient reports she is feeling better and hopeful to DC in the next day or two. Patient joked with CSW that everyone would want to come and visit her at home and that she needed to clean her house.  CSW inquired about patient's emotional status as well. Patient reports she is feeling better and family came to visit this weekend. Patient reports her friend's funeral was this weekend and she was sad that she had to miss it. Patient asked her niece to go to the funeral and give her friend's family her condolences. Patient reports she knows her friend is now resting comfortably.   Patient reports she is motivated to get back home and start working and going back to school. Patient reports she is feeling less depressed and has more energy. Patient thanked CSW for visit and agreeable for CSW to continue to support throughout hospital stay.  [place], [place] [phone]

## 2013-11-04 NOTE — Progress Notes (Signed)
Progress Note   Paula [name] ZOX:[id number] DOB: 1965/09/03 DOA: 10/22/2013 PCP: Arnoldo Morale, MD   Brief Narrative:   Paula [name] is an 48 y.o. female initially admitted 09/14/13 with one year history of nausea, vomiting and epigastric pain. EGD by Dr. 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 On 09/20/13, she underwent diagnostic laparoscopy, distal gastrectomy with Billroth II anastomosis and feeding GJ tube by Dr. Barry Dienes. UGI on 10/02/13 showed normal post gastrojejunostomy without perforation. Since then she has had problems with by mouth and tube feeding and has had multiple admissions for abdominal pain, nausea and vomiting. During one such admission, palliative consulted and patient wished to continue full curative treatment. Dr. [name] saw patient on 09/14/13 and advised her that she has at least stage III gastric cancer and would definitely need discussion of adjuvant treatment including chemotherapy and XRT-during outpatient followup. She was readmitted 10/22/13 with intractable nausea and vomiting. Despite extensive workup, she is still unable to consistently keep significant amount by mouth or via tube feeds. CCS following. 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   No mechanical causes for her symptoms based on extensive testing in the past-UGI -10/02/13. Repeat CT scan done 11/01/13 shows no evidence of obstruction or other complications at surgical site, and contrast extends through to the rectum.  Has been unable to consistently tolerate either by mouth or tube feeding since surgery 09/20/13.      GI was consulted and felt that her stomach was probably volume sensitive & had no further recommendations. GJ tube was changed on 10/27/13 secondary to leak. J-tube now not flushing. IR consultation for evaluation.  Psychiatry consulted to evaluate for psychogenic etiology and she was started on Prozac and Zyprexa to address symptoms of depression.   Surgery continues to follow and have maximized Reglan, clamping G-tube daily, continue J-tube feeding and bowel regimen for constipation. Patient has been refusing her tube feeds however. Diet advanced dysphasia to 10/30/13.    Continue PPI. Initially planned to have PICC line placed and TNA started, but IV team could not place line, and the patient's nausea/vomiting seems to have subsided over the past few days, advance diet and monitor for tolerance.  Seen by palliative care team for goals of care/symptom management.  Active Problems: Acute kidney injury  Creatinine bump noted 11/03/13. Likely secondary to dehydration. Resolved with increasing her IV fluids.  Chronic back pain  The patient's ongoing back pain and need for IV Dilaudid-HP for pain control is puzzling. An MRI of her lumbar spine done 10/11/13 was negative for fracture or metastatic disease with normal bone marrow signal.  She has had frequent rehospitalizations for management of this pain, but is now willing to speak with the palliative care team for pain management. If we do not get her pain adequately controlled, I fear she will continue to return to the hospital for pain relief.  Unable to tolerate switch to oral Dilaudid-HP for pain control. We'll need to discuss with palliative care and ongoing pain management strategy, possibly a home infusion pump.  Gastric cancer, at least stage III  Discussed with Dr. [name] on 10/23/13 who recommends outpatient followup when GI issues have settled for discussion regarding further cancer treatment.  CT head negative for metastasis.    Radiation oncology input appreciated, 5 weeks of radiation planned once symptoms adequately controlled.   Repeat CT scan of abdomen and pelvis 11/01/13 shows no obvious evidence of metastatic disease, and no abnormalities that would explain her symptoms, although there was a hypodense lesion in the liver.  Outpatient followup with oncology recommended.  Chronic Anemia  No reported bleeding. Hb stable, but trending down over time. No current indication for transfusion.  Hypertension  Clonidine patch added for improved control. Continue carvedilol.   History of GERD  Continue PPI.   Severe malnutrition in the context of chronic illness  Seen by dietitian 10/29/13 with recommendations to resume tube feedings at 25 mL/hour as tolerated. Unable to tolerate. We'll try to reintroduce tube feedings at 10 cc per hour.  Weight down 14 pounds since 10/22/13.  Confusion  As per nursing, patient has been intermittently confused and somnolent at times.  No evidence of brain metastasis on head CT.  Minimize sedative medications-narcotics and Ativan. Ongoing sedation likely from initiation of therapy with Zyprexa.  Hypokalemia  Replaced.  DVT Prophylaxis  Continue Lovenox.  Code Status: Full. Family Communication: No family at the bedside today. Disposition Plan: Home when stable.   IV Access:    Peripheral IV   Procedures and diagnostic studies:    GJ tube was changed by IR on 10/27/13.   Medical Consultants:    Dr. Excell Seltzer, General surgery   Dr. Anson Fret, Gastroenterology   Dr. Eppie Gibson, Radiation oncology   Interventional radiology   Dr. Zorita Pang, Psychiatry  Irena Reichmann, NP, Palliative care    Other Consultants:    Dietitian   Anti-Infectives:    None.  Subjective:   Paula [name] says her nausea and pain seemed to be a little bit better today. No vomiting over the past 24 hours. She has lost her IV site and has some  pain and "knots "in her arm (although these are not palpable).  Objective:    Filed Vitals:   11/03/13 1500 11/03/13 2059 11/03/13 2100 11/04/13 0537  BP: 93/59 89/62 104/62 94/60  Pulse: 88 89  90  Temp: 98.6 F (37 C) 97.9 F (36.6 C)  98.5 F (36.9 C)  TempSrc: Oral Oral  Oral  Resp: 16 16  16   Height:      Weight:    61.871 kg (136 lb 6.4 oz)  SpO2: 100% 100%  98%    Intake/Output Summary (Last 24 hours) at 11/04/13 0800 Last data filed at 11/04/13 0200  Gross per 24 hour  Intake 866.25 ml  Output    100 ml  Net 766.25 ml    Exam: Gen:  NAD Psych: Depressed affect Cardiovascular:  RRR, No M/R/G Respiratory:  Lungs CTAB Gastrointestinal:  Abdomen soft, minimally tender with positive bowel sounds Extremities:  No C/E/C   Data Reviewed:    Labs: Basic Metabolic Panel:  Recent Labs Lab 10/28/13 0840 10/29/13 0350 10/30/13 0400 11/02/13 1141 11/03/13 0433 11/04/13 0410  NA  --  138 138 134* 132* 134*  K  --  3.5* 3.7 3.6* 3.4* 4.0  CL  --  101 100 93* 92* 100  CO2  --  28 30 25 25 23   GLUCOSE  --  132* 108* 97 104* 103*  BUN  --  3* 3* 19 23 11   CREATININE  --  0.78 0.83 1.06 1.25* 0.79  CALCIUM  --  8.7 8.9 9.7 9.4 8.7  MG  1.5  --  1.6 2.0 2.0 1.9  PHOS  --   --   --  4.3 5.2* 3.6   GFR Estimated Creatinine Clearance: 72.3 ml/min (by C-G formula based on Cr of 0.79). Liver Function Tests:  Recent Labs Lab 10/28/13 0835 11/02/13 1141 11/03/13 0433 11/04/13 0410  AST 27 15 16  65*  ALT 45* 54* 44* 77*  ALKPHOS 87 94 92 85  BILITOT 0.7 0.6 0.7 0.8  PROT 5.8* 7.3 7.0 5.8*  ALBUMIN 2.8* 3.6 3.5 2.9*    Recent Labs Lab 10/28/13 0835  AMMONIA 23   CBC:  Recent Labs Lab 10/28/13 0835 10/30/13 0400 11/02/13 1141 11/04/13 0410  WBC 5.3 4.0 5.5 4.6  NEUTROABS  --   --  3.2 1.7  HGB 8.7* 8.3* 10.6* 8.5*  HCT 27.6* 26.4* 33.6* 26.3*  MCV 80.5 79.3 78.5 77.1*  PLT 261 263 352 282   CBG:  Recent Labs Lab 11/03/13 0546  11/03/13 1258 11/03/13 1813 11/03/13 2355 11/04/13 0546  GLUCAP 107* 104* 98 114* 98   Microbiology No results found for this or any previous visit (from the past 240 hour(s)).   Medications:   . carvedilol  6.25 mg Oral BID WC  . cloNIDine  0.2 mg Transdermal Weekly  . enoxaparin (LOVENOX) injection  40 mg Subcutaneous Q24H  . feeding supplement (ENSURE COMPLETE)  237 mL Oral BID BM  . feeding supplement (OSMOLITE 1.5 CAL)  1,000 mL Per Tube Q24H  . FLUoxetine  10 mg Oral Daily  . insulin aspart  0-9 Units Subcutaneous 4 times per day  . lip balm  1 application Topical BID  . LORazepam  0.5 mg Intravenous TID  . metoCLOPramide  10 mg Oral TID AC & HS  . OLANZapine zydis  5 mg Oral QHS  . pantoprazole  40 mg Oral BID AC  . polyethylene glycol  17 g Oral BID  . scopolamine  1 patch Transdermal Q72H  . sodium chloride  3 mL Intravenous Q12H   Continuous Infusions: . 0.9 % NaCl with KCl 40 mEq / L 75 mL/hr (11/04/13 0733)    Time spent: 25 minutes.   LOS: 13 days   [facility] Pager [phone]. If unable to reach me by pager, please call my cell phone at [phone].  *Please refer to [email or web address], password [email or web address] to get updated schedule on who will round on this patient, as hospitalists switch teams weekly. If 7PM-7AM, please contact night-coverage at [email or web address], password [email or web address] for any overnight needs.  11/04/2013, 8:00 AM

## 2013-11-05 ENCOUNTER — Encounter (HOSPITAL_COMMUNITY): Payer: Self-pay

## 2013-11-05 DIAGNOSIS — F450 Somatization disorder: Principal | ICD-10-CM | POA: Diagnosis present

## 2013-11-05 LAB — BASIC METABOLIC PANEL
ANION GAP: 10 (ref 5–15)
BUN: 5 mg/dL — AB (ref 6–23)
CO2: 26 mEq/L (ref 19–32)
Calcium: 9.2 mg/dL (ref 8.4–10.5)
Chloride: 102 mEq/L (ref 96–112)
Creatinine, Ser: 0.81 mg/dL (ref 0.50–1.10)
GFR calc non Af Amer: 86 mL/min — ABNORMAL LOW (ref >90)
Glucose, Bld: 112 mg/dL — ABNORMAL HIGH (ref 70–99)
POTASSIUM: 3.9 meq/L (ref 3.7–5.3)
SODIUM: 138 meq/L (ref 137–147)

## 2013-11-05 LAB — CBC
HEMATOCRIT: 26.3 % — AB (ref 36.0–46.0)
Hemoglobin: 8.5 g/dL — ABNORMAL LOW (ref 12.0–15.0)
MCH: 24.9 pg — ABNORMAL LOW (ref 26.0–34.0)
MCHC: 32.3 g/dL (ref 30.0–36.0)
MCV: 77.1 fL — ABNORMAL LOW (ref 78.0–100.0)
PLATELETS: 282 10*3/uL (ref 150–400)
RBC: 3.41 MIL/uL — ABNORMAL LOW (ref 3.87–5.11)
RDW: 14.9 % (ref 11.5–15.5)
WBC: 4.6 10*3/uL (ref 4.0–10.5)

## 2013-11-05 LAB — PHOSPHORUS: PHOSPHORUS: 3.1 mg/dL (ref 2.3–4.6)

## 2013-11-05 LAB — MAGNESIUM: MAGNESIUM: 1.9 mg/dL (ref 1.5–2.5)

## 2013-11-05 MED ORDER — PANTOPRAZOLE SODIUM 40 MG PO TBEC: 40.0000 mg | DELAYED_RELEASE_TABLET | Freq: Two times a day (BID) | ORAL | Status: DC

## 2013-11-05 MED ORDER — METOCLOPRAMIDE HCL 10 MG PO TABS: 10.0000 mg | ORAL_TABLET | Freq: Four times a day (QID) | ORAL | Status: DC

## 2013-11-05 MED ORDER — FLUOXETINE HCL 10 MG PO CAPS: 10.0000 mg | ORAL_CAPSULE | Freq: Every day | ORAL | Status: DC

## 2013-11-05 MED ORDER — CLONIDINE HCL 0.2 MG/24HR TD PTWK: 0.2000 mg | MEDICATED_PATCH | TRANSDERMAL | Status: DC

## 2013-11-05 MED ORDER — ENSURE COMPLETE PO LIQD: 237.0000 mL | Freq: Three times a day (TID) | ORAL | Status: DC

## 2013-11-05 MED ORDER — OLANZAPINE 5 MG PO TBDP: 5.0000 mg | ORAL_TABLET | Freq: Every day | ORAL | Status: DC

## 2013-11-05 MED ORDER — LORAZEPAM 1 MG PO TABS: 1.0000 mg | ORAL_TABLET | Freq: Three times a day (TID) | ORAL | Status: DC

## 2013-11-05 MED ORDER — POLYETHYLENE GLYCOL 3350 17 G PO PACK: 17.0000 g | PACK | Freq: Two times a day (BID) | ORAL | Status: DC

## 2013-11-05 MED ORDER — ENSURE COMPLETE PO LIQD: 237.0000 mL | Freq: Two times a day (BID) | ORAL | Status: DC

## 2013-11-05 MED ORDER — HYDROMORPHONE HCL 1 MG/ML PO LIQD: 1.0000 mg | ORAL | Status: DC | PRN

## 2013-11-05 NOTE — Discharge Summary (Signed)
Physician Discharge Summary  Paula [name] LZJ:[id number] DOB: [birth year] DOA: 10/22/2013  PCP: Arnoldo Morale, MD  Admit date: 10/22/2013 Discharge date: 11/05/2013   Recommendations for Outpatient Follow-Up:   1. Home health RN set up, patient at high risk for re-hospitalization.   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  Diet recommendation:  Regular as tolerated with supplemental tube feeds or Ensure supplements orally as tolerated.   History of Present Illness:   Paula [name] is an 48 y.o. female initially admitted 09/14/13 with one year history of nausea, vomiting and epigastric pain. EGD by Dr. 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 On 09/20/13, she underwent diagnostic laparoscopy, distal gastrectomy with Billroth II anastomosis and feeding GJ tube by Dr. Barry Dienes. UGI on 10/02/13 showed normal post gastrojejunostomy without perforation. Since then she has had problems with by mouth and tube feeding and has had multiple admissions for abdominal pain, nausea and vomiting. During one such admission, palliative consulted and patient wished to continue full curative treatment. Dr. [name] saw patient on 09/14/13 and advised her that she has at least stage III gastric cancer and would definitely need discussion  of adjuvant treatment including chemotherapy and XRT-during outpatient followup. She was readmitted 10/22/13 with intractable nausea and vomiting. Despite extensive workup, she is still unable to consistently keep significant amount by mouth or via tube feeds. CCS following. Psychiatry consulted to treat probable somatization disorder, with initiation of fluoxetine and Zyprexa.   Hospital Course by Problem:   Principal Problem:  Intractable nausea and vomiting, multifactorial, with gastric atony and possible somatization disorder contributory  Psychogenic etiology probably contributes to the cause of her ongoing symptoms.  No mechanical causes for her symptoms based on extensive testing in the past-UGI -10/02/13. Repeat CT scan done 11/01/13 shows no evidence of obstruction or other complications at surgical site, and contrast extends through to the rectum.  Has been unable to consistently tolerate either by mouth or tube feeding since surgery 09/20/13.  GI was consulted and felt that her stomach was probably volume sensitive & had no further recommendations. GJ tube was changed on 10/27/13 secondary to leak. J-tube evaluated by IR secondary to it not flushing. Flushes with a 10 cc syringe.  Psychiatry consulted to evaluate for psychogenic etiology and she was started on Prozac and Zyprexa to address symptoms of depression.  Surgery continues to follow and have maximized Reglan, clamping G-tube daily, continue J-tube feeding and bowel regimen for constipation.  Continue PPI. Initially planned to have PICC line placed and TNA started, but IV team could not place line, and the patient's nausea/vomiting seems to have subsided over the past few days, diet advanced tolerated prior to discharge. Has continued to refuse supplemental tube feeds.  Seen by palliative care team for for assistance with goals of care/symptom  management.  Active Problems:  Acute kidney injury  Creatinine bump noted 11/03/13. Likely  secondary to dehydration. Resolved with increasing her IV fluids.  Chronic back pain  The patient's ongoing back pain and need for IV Dilaudid-HP for pain control is puzzling. An MRI of her lumbar spine done 10/11/13 was negative for fracture or metastatic disease with normal bone marrow signal.  She has had frequent rehospitalizations for management of this pain, but is now willing to speak with the palliative care team for pain management. If we do not get her pain adequately controlled, I fear she will continue to return to the hospital for pain relief.  Has tolerated pain management with liquid Dilaudid-HP prior to discharge.  Gastric cancer, at least stage III  Discussed with Dr. [name] on 10/23/13 who recommends outpatient followup when GI issues have settled for discussion regarding further cancer treatment.  CT head negative for metastasis.  Radiation oncology input appreciated, 5 weeks of radiation planned once symptoms adequately controlled.  Repeat CT scan of abdomen and pelvis 11/01/13 shows no obvious evidence of metastatic disease, and no abnormalities that would explain her symptoms, although there was a hypodense lesion in the liver.  Outpatient followup with oncology and radiation oncology scheduled for 11/06/13.  Chronic Anemia  No reported bleeding. Hb stable, but trending down over time. No current indication for transfusion.  Hypertension  Clonidine patch added for improved control. Continue carvedilol.   History of GERD  Continue PPI.   Severe malnutrition in the context of chronic illness  Seen by dietitian 10/29/13 with recommendations to resume tube feedings at 25 mL/hour as tolerated. Has refused attempts to reintroduce tube feeds.   Weight down 14 pounds since 10/22/13.  Confusion  As per nursing, patient has been intermittently confused and somnolent at times. No evidence of brain metastasis on head CT.  Minimize sedative medications-narcotics and Ativan. Ongoing  sedation likely from initiation of therapy with Zyprexa.  Hypokalemia  Replaced.   Medical Consultants:    Dr. Excell Seltzer, General surgery   Dr. Anson Fret, Gastroenterology   Dr. Eppie Gibson, Radiation oncology   Interventional radiology   Dr. Zorita Pang, Psychiatry   Irena Reichmann, NP, Palliative care  Discharge Exam:   Filed Vitals:   11/05/13 0400  BP: 92/59  Pulse: 94  Temp: 98.1 F (36.7 C)  Resp: 16   Filed Vitals:   11/04/13 1442 11/04/13 2122 11/05/13 0355 11/05/13 0400  BP: 89/58 106/64  92/59  Pulse: 93 88  94  Temp: 97.7 F (36.5 C) 97.9 F (36.6 C)  98.1 F (36.7 C)  TempSrc: Oral Oral  Oral  Resp: 16 18  16   Height:      Weight:   61.553 kg (135 lb 11.2 oz)   SpO2: 100% 100%  100%    Gen:  NAD, chronically ill-appearing Cardiovascular:  RRR, No M/R/G Respiratory: Lungs CTAB Gastrointestinal: Abdomen soft, NT/ND with normal active bowel sounds, GJ tube intact left lower quadrant. Extremities: No C/E/C   The results of significant diagnostics from this hospitalization (including imaging, microbiology, ancillary and laboratory) are listed below for reference.     Procedures and Diagnostic Studies:   GJ tube was changed by IR on 10/27/13.  Ct Abdomen Pelvis W Contrast 11/01/2013: 1. Postoperative findings from recent partial gastrectomy. 2. 1.0 x 1.1 cm hypodense lesion in segment 2 of the liver. This lesion is not entirely simple in appears to have internal septation. An arterial phase enhancing 9 x 8 mm  lesion is seen in this location on the CT chest from 09/13/2013, but there is no visible lesion differentiable from the hepatic parenchyma on the portal venous and delayed phase images of the CT abdomen from 09/11/2013. Differential diagnostic considerations include hypervascular metastatic lesion, focal nodular hyperplasia, and a small hepatic adenoma. Imaging workup options include hepatic protocol MRI and nuclear medicine PET-CT. 3.  Ancillary findings include stable abnormal interstitial thickening in the right lower lobe and uterine fibroids.     Ir Margarita Grizzle Obstruc Mat Any Colon Tube W/fluoro 11/04/2013: Bedside on clogging of gastrojejunostomy tube. The catheter flushes well with a 10 cc syringe, though there is some resistance with a large (30 cc catheter tip) syringe. At this time the gastrojejunostomy tube is operable, and the team will attempt feeding through the current tube.  PLAN: The care team will attempt to use the indwelling tube for subsequent feeds. If there are other troubles encounter with the gastrojejunostomy tube the team will Re contact interventional radiology for further evaluation and potential exchange of the indwelling tube.   Labs:   Basic Metabolic Panel:  Recent Labs Lab 10/30/13 0400 11/02/13 1141 11/03/13 0433 11/04/13 0410 11/05/13 0410  NA 138 134* 132* 134* 138  K 3.7 3.6* 3.4* 4.0 3.9  CL 100 93* 92* 100 102  CO2 30 25 25 23 26   GLUCOSE 108* 97 104* 103* 112*  BUN 3* 19 23 11  5*  CREATININE 0.83 1.06 1.25* 0.79 0.81  CALCIUM 8.9 9.7 9.4 8.7 9.2  MG 1.6 2.0 2.0 1.9 1.9  PHOS  --  4.3 5.2* 3.6 3.1   GFR Estimated Creatinine Clearance: 71.1 ml/min (by C-G formula based on Cr of 0.81). Liver Function Tests:  Recent Labs Lab 11/02/13 1141 11/03/13 0433 11/04/13 0410  AST 15 16 65*  ALT 54* 44* 77*  ALKPHOS 94 92 85  BILITOT 0.6 0.7 0.8  PROT 7.3 7.0 5.8*  ALBUMIN 3.6 3.5 2.9*   CBC:  Recent Labs Lab 10/30/13 0400 11/02/13 1141 11/04/13 0410  WBC 4.0 5.5 4.6  NEUTROABS  --  3.2 1.7  HGB 8.3* 10.6* 8.5*  HCT 26.4* 33.6* 26.3*  MCV 79.3 78.5 77.1*  PLT 263 352 282   CBG:  Recent Labs Lab 11/03/13 1258 11/03/13 1813 11/03/13 2355 11/04/13 0546 11/04/13 1922  GLUCAP 104* 98 114* 98 123*   Lipid Profile  Recent Labs  11/03/13 0433 11/04/13 0410  TRIG 121 88     Discharge Instructions:   Discharge Instructions   Call MD for:  extreme fatigue     Complete by:  As directed      Call MD for:  persistant nausea and vomiting    Complete by:  As directed      Call MD for:  severe uncontrolled pain    Complete by:  As directed      Diet general    Complete by:  As directed      Discharge instructions    Complete by:  As directed   You were cared for by Dr. Jacquelynn Cree  (a hospitalist) during your hospital stay. If you have any questions about your discharge medications or the care you received while you were in the hospital after you are discharged, you can call the unit and ask to speak with the hospitalist on call if the hospitalist that took care of you is not available. Once you are discharged, your primary care physician will handle any further medical issues. Please note  that NO REFILLS for any discharge medications will be authorized once you are discharged, as it is imperative that you return to your primary care physician (or establish a relationship with a primary care physician if you do not have one) for your aftercare needs so that they can reassess your need for medications and monitor your lab values.  Any outstanding tests can be reviewed by your PCP at your follow up visit.  It is also important to review any medicine changes with your PCP.  Please bring these d/c instructions with you to your next visit so your physician can review these changes with you.  If you do not have a primary care physician, you can call [phone] for a physician referral.  It is highly recommended that you obtain a PCP for hospital follow up.     Face-to-face encounter (required for Medicare/Medicaid patients)    Complete by:  As directed   I RAMA,CHRISTINA certify that this patient is under my care and that I, or a nurse practitioner or physician's assistant working with me, had a face-to-face encounter that meets the physician face-to-face encounter requirements with this patient on 11/05/2013. The encounter with the patient was in whole, or in part  for the following medical condition(s) which is the primary reason for home health care (List medical condition): Gastric CA s/p GJ tube placement. Skilled RN to assess nutritional status and weight stability, provide ongoing education about disease and symptom management.  High risk for re-hospitalization.  The encounter with the patient was in whole, or in part, for the following medical condition, which is the primary reason for home health care:  Gastric CA with indwelling GJ tube that needs Skilled RN  I certify that, based on my findings, the following services are medically necessary home health services:  Nursing  My clinical findings support the need for the above services:  Unable to leave home safely without assistance and/or assistive device  Further, I certify that my clinical findings support that this patient is homebound due to:  Unable to leave home safely without assistance  Reason for Medically Necessary Home Health Services:   Skilled Nursing- Changes in Medication/Medication Management Skilled Nursing- Skilled Assessment/Observation Skilled Nursing- Teaching of Disease Process/Symptom Management Skilled Nursing- Post-Surgical Wound Assessment and Care       Home Health    Complete by:  As directed   To provide the following care/treatments:  RN     Increase activity slowly    Complete by:  As directed             Medication List    STOP taking these medications       fentaNYL 25 MCG/HR patch  Commonly known as:  DURAGESIC - dosed mcg/hr     fentaNYL 50 MCG/HR  Commonly known as:  DURAGESIC     lidocaine 5 %  Commonly known as:  LIDODERM     oxyCODONE 5 MG/5ML solution  Commonly known as:  ROXICODONE      TAKE these medications       carvedilol 6.25 MG tablet  Commonly known as:  COREG  Take 6.25 mg by mouth 2 (two) times daily with a meal.     cloNIDine 0.2 mg/24hr patch  Commonly known as:  CATAPRES - Dosed in mg/24 hr  Place 1 patch (0.2 mg total)  onto the skin once a week.     feeding supplement (ENSURE COMPLETE) Liqd  Take 237 mLs by mouth 3 (three) times  daily between meals.     FLUoxetine 10 MG capsule  Commonly known as:  PROZAC  Take 1 capsule (10 mg total) by mouth daily.     HYDROmorphone HCl 1 MG/ML Liqd  Commonly known as:  DILAUDID  Place 1 mL (1 mg total) into feeding tube every 2 (two) hours as needed for severe pain.     LORazepam 1 MG tablet  Commonly known as:  ATIVAN  Take 1 tablet (1 mg total) by mouth 3 (three) times daily.     metoCLOPramide 10 MG tablet  Commonly known as:  REGLAN  Take 1 tablet (10 mg total) by mouth 4 (four) times daily.     MULTIVITAMIN & MINERAL PO  Take 1 tablet by mouth daily.     OLANZapine zydis 5 MG disintegrating tablet  Commonly known as:  ZYPREXA  Take 1 tablet (5 mg total) by mouth at bedtime.     omeprazole 20 MG capsule  Commonly known as:  PRILOSEC  Take 20 mg by mouth 2 (two) times daily before a meal.     ondansetron 8 MG disintegrating tablet  Commonly known as:  ZOFRAN-ODT  Take 8 mg by mouth every 4 (four) hours as needed for nausea or vomiting.     pantoprazole 40 MG tablet  Commonly known as:  PROTONIX  Take 1 tablet (40 mg total) by mouth 2 (two) times daily before a meal.     polyethylene glycol packet  Commonly known as:  MIRALAX / GLYCOLAX  Take 17 g by mouth 2 (two) times daily.     promethazine 6.25 MG/5ML syrup  Commonly known as:  PHENERGAN  Take 12.5 mg by mouth every 6 (six) hours as needed for nausea or vomiting.     scopolamine 1 MG/3DAYS  Commonly known as:  TRANSDERM-SCOP  Place 1 patch onto the skin every 3 (three) days.     sennosides 8.8 MG/5ML syrup  Commonly known as:  SENOKOT  Take 5 mLs by mouth at bedtime.     zolpidem 10 MG tablet  Commonly known as:  AMBIEN  Take 10 mg by mouth at bedtime as needed for sleep.           Follow-up Information   Follow up with [facility], MD. Schedule an appointment as soon as  possible for a visit in 2 weeks.   Specialty:  General Surgery   Contact information:   [address] [place] [address] [phone]       Follow up with Eilleen Kempf., MD. (At your appt 11/06/13)    Specialty:  Oncology   Contact information:   [place] [address] [phone]       Follow up with Acquanetta Belling, MD. (At your appt scheduled for 11/06/13)    Specialty:  Radiation Oncology   Contact information:   [place]. [place] [address] [phone]        Time coordinating discharge: 35 minutes.  Signed:  RAMA,CHRISTINA  Pager [phone] [facility] 11/05/2013, 3:19 PM

## 2013-11-05 NOTE — Discharge Instructions (Signed)
Flush gastrojejunostomy tube (BOTH Gastric & jejunal ports) with 30 mL of warm tap water air every 6 hours to prevent the tube from being clogged. No medicines through a jejunostomy port. If need to give medicines through the tube because the patient cannot or refuses to take it orally, provide in elixir form or crushed through gastric port.   Antrectomy, Partial, or Subtotal Gastrectomy An antrectomy, partial, or subtotal gastrectomy is the surgical removal of part of the stomach. It is performed to treat cancer of the stomach, ulcer disease, obstruction, or injury (trauma). LET YOUR CAREGIVER KNOW ABOUT:   Allergies to food or medicine.  Medicines taken, including vitamins, herbs, eyedrops, over-the-counter medicines, and creams.  Use of steroids (by mouth or creams).  Previous problems with anesthetics or numbing medicines.  History of bleeding problems or blood clots.  Previous surgery.  Other health problems, including diabetes and kidney problems.  Possibility of pregnancy, if this applies. RISKS AND COMPLICATIONS  You will be monitored closely for complications during surgery and recovery. Many complications can be treated. Complications may include:  Bleeding.  Infection.  Reaction to anesthesia.  Damage to other organs or tissue.  Hernia.  Blood clot. BEFORE THE PROCEDURE  Before your procedure, you may have:  A physical exam, blood tests, stool test, X-rays, and other procedures.  Chemotherapy or radiation therapy.  Your caregiver review with you the procedure, the anesthesia being used, and what to expect after the procedure. You may be asked to:  Stop taking certain medicines for several days prior to your procedure, such as blood thinners (including aspirin).  Take certain medicines, such as antibiotics or stool softeners.  Follow a special diet for several days prior to the procedure.  Avoid eating and drinking after midnight the night before the  procedure. This will help you to avoid complications from the anesthesia.  Take an antibacterial shower the night before or the morning of the procedure. Arrange for a ride home after surgery and to ask someone to help you with activities during recovery. PROCEDURE   You will be given a medicine to make you sleep (general anesthesia) during the procedure.  The procedure takes a few to several hours to complete. You will not feel any pain.  Antrectomy, partial, or subtotal gastrectomy can be performed as an open procedure or a laparoscopic procedure.  During an open procedure, the surgeon will make a cut (incision) in the abdominal wall to access the stomach.  During a laparoscopic procedure, small incisions are made in the abdominal wall. Small, lighted tubes (laparoscopes) with instruments are inserted into the surgical site.  Sometimes, a combination procedure is performed using both of these techniques.  The surgeon will remove a part of the stomach and connect the remaining stomach to the small intestine. Depending on your condition, other parts (spleen, pancreas, lymph nodes) may be removed during surgery as well. AFTER THE PROCEDURE   You will be monitored closely in a recovery room.  You will need to stay in the hospital for up to a week or longer, depending on your condition.  You will be given medicine for pain and nutrition through an intravenous (IV) access.  If you have a tube in the nose (nasogastric, NG tube) to remove fluids, it will be removed after 2 to 3 days. MAKE SURE YOU:   Understand these instructions.  Will watch your condition.  Will get help right away if you are not doing well or get worse. Document Released: 05/04/2009  Document Revised: 05/02/2011 Document Reviewed: 05/04/2009 [facility] Patient Information 2015 [place], [place]. This information is not intended to replace advice given to you by your health care provider. Make sure you discuss any  questions you have with your health care provider.

## 2013-11-05 NOTE — Care Management Note (Signed)
CARE MANAGEMENT NOTE 11/05/2013  Patient:  Paula [name], Paula [name]   Account Number:  [id number]  Date Initiated:  11/05/2013  Documentation initiated by:  Leafy Kindle  Subjective/Objective Assessment:   admitted with intractabel N&V.  Hx of gastric CA     Action/Plan:   from home alone.  Plan to home with [facility] follow up   Anticipated DC Date:  11/05/2013   Anticipated DC Plan:  [place]  CM consult      Choice offered to / List presented to:  C-1 Patient        [place] arranged  HH-1 RN      [place].   Status of service:  Completed, signed off Medicare Important Message given?   (If response is "NO", the following Medicare IM given date fields will be blank) Date Medicare IM given:   Medicare IM given by:   Date Additional Medicare IM given:   Additional Medicare IM given by:    Discharge Disposition:    Per UR Regulation:  Reviewed for med. necessity/level of care/duration of stay  If discussed at Start of Stay Meetings, dates discussed:    Comments:  11/05/13 Marney Doctor RN,BSN,NCM Pt from home alone with multiple admissions for N&V.  HHRN ordered to follow up with pt with her tube feedings.  Pt has used [facility] in the past and would like to use them again. [facility] rep called with order for [facility].

## 2013-11-05 NOTE — Progress Notes (Addendum)
NUTRITION FOLLOW UP  Intervention:   -As warranted, initiate Osmolite 1.2 at 10 ml/hr for 24 hours via [place] tube per MD order. This will provide 288 kcal (16% est kcal needs), and 13 gram protein (15% est protein needs) and 197 ml free water -Modified Ensure Complete po BID, each supplement provides 350 kcal and 13 grams of protein -Provided pt with nutrition supplement samples and coupons -Encourage PO intake as tolerated -RD to follow  Nutrition Dx:   Inadequate oral intake related to inability to eat as evidenced by NPO status; ongoing, now r/t nausea/vomiting and evidenced by decreased PO intake   Goal:   TPN to meet >/= 90% of their estimated nutrition needs; TPN d/c'd   New Goal: TF + PO intake to meet >/= 90% of their estimated nutrition needs    Monitor:   TF tolerance, total protein/energy intake, labs, weights, diet order, GI profile  Assessment:   9/01: -Diet recall indicated pt consuming snack-like meals- several bites of cereal w/milk, juices, yogurt, softened proteins (chicken), and ice cream. Was not consuming Unjury supplement as pt did not know where to procure them. Reinforced [facility] outpatient to purchase them upon d/c as warranted  -Educated pt on minimizing intake of high fat foods during episodes of nausea. Would also benefit from limiting high sugar foods d/t recent gastrectomy surgery and increased risk of dumping syndrome  -Endorsed ongoing weight loss of 20-30 lbs since 08/2013 d/t frequent hospitalizations and sub-optimal nutrition  -Pt currently NPO and bowel rest d/t ongoing nausea/vomiting. Consider nutrition support as pt unlikley able to sustain nutritional status via PO intake. Has GJ tube, pt has trialed tube feeding during previous admits; however pt was unable to tolerate and experienced nausea and vomiting even at low rate levels  -Pt with constipation; has not had BM since 8/29  9/02: -UGI completed, MD noted some reflux; however no post-operative  anatomic findings to explain nausea or emesis -Pt with some nausea, but no emesis since 8/31 -GJ tube placement confirmed, plan to initiate TF via GJ tube and advance PO diet as tolerated -Last BM on 8/29, passing flatus -Phos/Mg/K WNL, monitor for refeeding risk d/t prolonged sub-optimal nutrition intake and weight loss. Has hx of TF intolerance, will start at low dose and opt for higher calorically dense formula.  9/03: -Osmolite 1.5 initiated at 10 ml/hr, tolerated advancement to 20 ml/hr. Pt w/feelings of nausea and emesis when TF at 30 ml/hr -Surgery recommends to run TF at 20 ml/hr for 24 hours, and reassess advancement/tolerance. Will increase Pro-Stat to TID for additional protein while TF running at low rate -Diet advanced to clear liquids. Will order Unjury Chicken Broth once daily to provide more nutrient dense option  9/7: -G-J tube replaced as tube was clogged, orders for nothing but feeding to be put through j-port. -Osmolite 1.5 increased to 25 ml/hr per J-port. -Patient reports no nausea but G port to gravity. -Ate 1/4 grits and 1/8 applesauce this am. -Has not been getting Unjury and wants Ensure over ice which she likes. -Gastric cancer at least stage III noted. -Calorie count started per MD -Patient taking meds po or per g-tube.  Plus po food.  Are these being absorbed with G tube to gravity drain with high output?  9/14: -Pt underwent 48 hour calorie count 9/07-9/09; which indicated pt consuming < 50% estimated nutrition needs. PO intake + tube feeds at goal rate also unable to meet > 90% estimated nutrition needs. -Pt refusing tube feeds and  Ensure Complete supplement. Will discontinue TF order (recommendations available to re-implement as needed) and modify supplement to PRN as tolerated. Pt resting and lethargic during time of RD follow up. -Continues to have nausea and vomiting. Gtube open drain with 100 ml output. Receiving Reglan -Recommend diet be upgraded to Dys2  to provide pt additional PO options. Diet advanced to Dys2 on 9/09. Current PO intake < 25%. -Weight continues to decline, has lost approximately 5 lbs in one week (3.5% body weight loss, severe for time frame) -PICC line to be placed to initiate TPN d/t continued intolerance of TF and sub-optimal PO intake. Monitor refeeding labs and replete as warranted -Per pharmacy note, plant to initiate Clinimix 5/15 at 40 ml/hr with goal of Clinimix E5/15 at 80 ml/hr with lipids 20% at 10 ml/hr to provide 1843 kcal (100% est kcal needs), and 96 gram protein (100% est protein needs) -Phos was elevated on 9/13; however is now WNL and Clinimix E5/15 formula would likely be appropriate to initiate -Phos/Mg/K WNL -CBGs at goal of < 150 mg/dl   9/15: -TPN d/c'd as pt's nausea/vomiting improving. Diet advanced to Dys3 on 9/14.  -Pt reporting tolerating diet yesterday, PO intake 20-100%. Per discussion with NT, pt had not yet ordered breakfast during time of follow up as pt had been resting. NT noted she would encourage PO intake as tolerate and remind pt to order meals -Requesting for Ensure to be set on regular schedule. Provided pt with supplement and gave pt multiple nutrition supplement coupons to be used upon d/c to assist in supplement regimen compliance -MD ordered Osmolite 1.2 at 10 ml/hr for 24 hours to be initiated via [place] tube. Pt currently refusing TF until she has discussion with palliative care team -Phos/Mg/K WNL -CBG's < 150 mg/dl   Height: Ht Readings from Last 1 Encounters:  10/22/13 5' (1.524 m)    Weight Status:   Wt Readings from Last 1 Encounters:  11/05/13 135 lb 11.2 oz (61.553 kg)  10/28/12 141 lbs  Re-estimated needs:  Kcal: 1800-2000  Protein: 85-95 gram  Fluid: >/=1800 ml/daily   Skin: WDL   Diet Order: Dysphagia 3/thin   Intake/Output Summary (Last 24 hours) at 11/05/13 0939 Last data filed at 11/05/13 0500  Gross per 24 hour  Intake    480 ml  Output    400 ml   Net     80 ml    Last BM: 9/07   Labs:   Recent Labs Lab 11/03/13 0433 11/04/13 0410 11/05/13 0410  NA 132* 134* 138  K 3.4* 4.0 3.9  CL 92* 100 102  CO2 25 23 26   BUN 23 11 5*  CREATININE 1.25* 0.79 0.81  CALCIUM 9.4 8.7 9.2  MG 2.0 1.9 1.9  PHOS 5.2* 3.6 3.1  GLUCOSE 104* 103* 112*    CBG (last 3)   Recent Labs  11/03/13 2355 11/04/13 0546 11/04/13 1922  GLUCAP 114* 98 123*    Scheduled Meds: . carvedilol  6.25 mg Oral BID WC  . cloNIDine  0.2 mg Transdermal Weekly  . feeding supplement (ENSURE COMPLETE)  237 mL Oral BID BM  . feeding supplement (OSMOLITE 1.2 CAL)  1,000 mL Per Tube Q24H  . FLUoxetine  10 mg Oral Daily  . lip balm  1 application Topical BID  . LORazepam  1 mg Oral TID  . metoCLOPramide  10 mg Oral TID AC & HS  . OLANZapine zydis  5 mg Oral QHS  . pantoprazole  40 mg Oral BID AC  . polyethylene glycol  17 g Oral BID  . scopolamine  1 patch Transdermal Q72H  . sodium chloride  3 mL Intravenous Q12H    Continuous Infusions:   Atlee Abide MS RD LDN Clinical Dietitian [phone]

## 2013-11-06 ENCOUNTER — Ambulatory Visit (HOSPITAL_BASED_OUTPATIENT_CLINIC_OR_DEPARTMENT_OTHER): Payer: No Typology Code available for payment source | Admitting: Internal Medicine

## 2013-11-06 ENCOUNTER — Ambulatory Visit: Payer: No Typology Code available for payment source

## 2013-11-06 ENCOUNTER — Encounter: Payer: Self-pay | Admitting: Radiation Oncology

## 2013-11-06 ENCOUNTER — Ambulatory Visit: Payer: No Typology Code available for payment source | Admitting: Radiation Oncology

## 2013-11-06 ENCOUNTER — Other Ambulatory Visit: Payer: Self-pay

## 2013-11-06 ENCOUNTER — Ambulatory Visit
Admission: RE | Admit: 2013-11-06 | Discharge: 2013-11-06 | Disposition: A | Discharge status: Home / Self Care | Payer: No Typology Code available for payment source | Source: Ambulatory Visit | Attending: Radiation Oncology | Admitting: Radiation Oncology

## 2013-11-06 ENCOUNTER — Other Ambulatory Visit (HOSPITAL_BASED_OUTPATIENT_CLINIC_OR_DEPARTMENT_OTHER): Payer: No Typology Code available for payment source

## 2013-11-06 ENCOUNTER — Encounter: Payer: Self-pay | Admitting: Internal Medicine

## 2013-11-06 ENCOUNTER — Ambulatory Visit
Admission: RE | Admit: 2013-11-06 | Payer: No Typology Code available for payment source | Source: Ambulatory Visit | Attending: Radiation Oncology | Admitting: Radiation Oncology

## 2013-11-06 VITALS — BP 122/78 | HR 81 | Temp 98.4°F | Resp 18 | Ht 60.0 in | Wt 137.3 lb

## 2013-11-06 VITALS — BP 96/69 | HR 105 | Temp 98.5°F | Ht 60.0 in | Wt 136.6 lb

## 2013-11-06 DIAGNOSIS — C772 Secondary and unspecified malignant neoplasm of intra-abdominal lymph nodes: Secondary | ICD-10-CM

## 2013-11-06 DIAGNOSIS — Z903 Acquired absence of stomach [part of]: Secondary | ICD-10-CM | POA: Diagnosis not present

## 2013-11-06 DIAGNOSIS — Z87891 Personal history of nicotine dependence: Secondary | ICD-10-CM | POA: Diagnosis not present

## 2013-11-06 DIAGNOSIS — Z510 Encounter for antineoplastic radiation therapy: Secondary | ICD-10-CM | POA: Diagnosis present

## 2013-11-06 DIAGNOSIS — I10 Essential (primary) hypertension: Secondary | ICD-10-CM

## 2013-11-06 DIAGNOSIS — Z934 Other artificial openings of gastrointestinal tract status: Secondary | ICD-10-CM | POA: Diagnosis not present

## 2013-11-06 DIAGNOSIS — R112 Nausea with vomiting, unspecified: Secondary | ICD-10-CM

## 2013-11-06 DIAGNOSIS — C163 Malignant neoplasm of pyloric antrum: Secondary | ICD-10-CM

## 2013-11-06 DIAGNOSIS — C169 Malignant neoplasm of stomach, unspecified: Secondary | ICD-10-CM

## 2013-11-06 DIAGNOSIS — Z980 Intestinal bypass and anastomosis status: Secondary | ICD-10-CM | POA: Diagnosis not present

## 2013-11-06 LAB — CBC WITH DIFFERENTIAL/PLATELET
BASO%: 0.6 % (ref 0.0–2.0)
Basophils Absolute: 0 10*3/uL (ref 0.0–0.1)
EOS%: 3.1 % (ref 0.0–7.0)
Eosinophils Absolute: 0.1 10*3/uL (ref 0.0–0.5)
HEMATOCRIT: 29.6 % — AB (ref 34.8–46.6)
HGB: 9 g/dL — ABNORMAL LOW (ref 11.6–15.9)
LYMPH%: 34.3 % (ref 14.0–49.7)
MCH: 24.4 pg — ABNORMAL LOW (ref 25.1–34.0)
MCHC: 30.4 g/dL — AB (ref 31.5–36.0)
MCV: 80.2 fL (ref 79.5–101.0)
MONO#: 0.5 10*3/uL (ref 0.1–0.9)
MONO%: 10.7 % (ref 0.0–14.0)
NEUT#: 2.4 10*3/uL (ref 1.5–6.5)
NEUT%: 51.3 % (ref 38.4–76.8)
PLATELETS: 320 10*3/uL (ref 145–400)
RBC: 3.69 10*6/uL — ABNORMAL LOW (ref 3.70–5.45)
RDW: 16 % — ABNORMAL HIGH (ref 11.2–14.5)
WBC: 4.7 10*3/uL (ref 3.9–10.3)
lymph#: 1.6 10*3/uL (ref 0.9–3.3)

## 2013-11-06 LAB — COMPREHENSIVE METABOLIC PANEL (CC13)
ALT: 154 U/L — ABNORMAL HIGH (ref 0–55)
ANION GAP: 9 meq/L (ref 3–11)
AST: 73 U/L — ABNORMAL HIGH (ref 5–34)
Albumin: 3.4 g/dL — ABNORMAL LOW (ref 3.5–5.0)
Alkaline Phosphatase: 115 U/L (ref 40–150)
BUN: <3 mg/dL — ABNORMAL LOW (ref 7.0–26.0)
CO2: 25 meq/L (ref 22–29)
CREATININE: 0.8 mg/dL (ref 0.6–1.1)
Calcium: 8.8 mg/dL (ref 8.4–10.4)
Chloride: 104 mEq/L (ref 98–109)
Glucose: 122 mg/dl (ref 70–140)
Potassium: 3.5 mEq/L (ref 3.5–5.1)
Sodium: 139 mEq/L (ref 136–145)
Total Bilirubin: 0.48 mg/dL (ref 0.20–1.20)
Total Protein: 6.7 g/dL (ref 6.4–8.3)

## 2013-11-06 MED ORDER — HYDROMORPHONE HCL 1 MG/ML PO LIQD
1.0000 mg | Freq: Once | ORAL | Status: AC
  Administered 2013-11-06: 1 mg via ORAL
  Filled 2013-11-06 (×2): qty 1

## 2013-11-06 MED ORDER — HYDROMORPHONE HCL 1 MG/ML PO LIQD: 1.0000 mg | Freq: Once | ORAL | Status: DC

## 2013-11-06 NOTE — Progress Notes (Signed)
Administered 1 mg Dilaudid liquid via PEG tube as ordered by Dr. Isidore Moos. Flushed PEG tube with 10 cc of luke warm water. Patient tolerated this well. Patient reports abdominal pain 5 on a scale of 0-10. Patient reports her son plans to drive her to her next appointment.

## 2013-11-07 ENCOUNTER — Encounter: Payer: Self-pay | Admitting: Radiation Oncology

## 2013-11-07 ENCOUNTER — Telehealth: Payer: Self-pay | Admitting: Internal Medicine

## 2013-11-07 DIAGNOSIS — C163 Malignant neoplasm of pyloric antrum: Secondary | ICD-10-CM | POA: Insufficient documentation

## 2013-11-07 LAB — CEA: CEA: 0.5 ng/mL (ref 0.0–5.0)

## 2013-11-07 NOTE — Telephone Encounter (Signed)
s.w. pt and advised on nxt week appts....will contact pt with port appt once i get call back...i will also contact pt about time of 9.29 appt once Dr. Tyrell Antonio responds to e-mail.

## 2013-11-07 NOTE — Progress Notes (Signed)
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  She was discharged yesterday from the hospital. Her nausea is under better control, now. She is taking dilaudid for pain via feeding tube and requests some today as she left her suspension at home.      She is eating some as well; may get a hot dog before her appt w/ medical oncology this PM. She feels cold and is wrapped in blankets.  Afebrile                       ALLERGIES:  has No Known Allergies.  Meds: Current Outpatient Prescriptions  Medication Sig Dispense Refill  . carvedilol (COREG) 6.25 MG tablet Take 6.25 mg by mouth 2 (two) times daily with a meal.      . cloNIDine (CATAPRES - DOSED IN MG/24 HR) 0.2 mg/24hr patch Place 1 patch (0.2 mg total) onto the skin once a week.  10 patch  12  . feeding supplement, ENSURE COMPLETE, (ENSURE COMPLETE) LIQD Take 237 mLs by mouth 3 (three) times daily between meals.  20 Bottle  3  . HYDROmorphone HCl (DILAUDID) 1 MG/ML LIQD Place 1 mL (1 mg total) into feeding tube every 2 (two) hours as needed for severe pain.  30 mL  0  . LORazepam (ATIVAN) 1 MG tablet Take 1 tablet (1 mg total) by mouth 3 (three) times daily.  30 tablet  0  . metoCLOPramide (REGLAN) 10 MG tablet Take 1 tablet (10 mg total) by mouth 4 (four) times daily.  120 tablet  3  . Multiple Vitamins-Minerals (MULTIVITAMIN & MINERAL PO) Take 1 tablet by mouth daily.      Marland Kitchen OLANZapine zydis (ZYPREXA) 5 MG disintegrating tablet Take 1 tablet (5 mg total) by mouth at bedtime.  30 tablet  3  . ondansetron (ZOFRAN-ODT) 8 MG disintegrating tablet Take 8 mg by  mouth every 4 (four) hours as needed for nausea or vomiting.      . pantoprazole (PROTONIX) 40 MG tablet Take 1 tablet (40 mg total) by mouth 2 (two) times daily before a meal.  60 tablet  3  . polyethylene glycol (MIRALAX / GLYCOLAX) packet Take 17 g by mouth 2 (two) times daily.  14 each  0  . scopolamine (TRANSDERM-SCOP) 1 MG/3DAYS Place 1 patch onto the skin every 3 (three) days.      . fentaNYL (DURAGESIC - DOSED MCG/HR) 25 MCG/HR patch       . FLUoxetine (PROZAC) 10 MG capsule Take 1 capsule (10 mg total) by mouth daily.  30 capsule  3  . lidocaine (LIDODERM) 5 %        No current facility-administered medications for this encounter.    Physical Findings: The patient is in no acute distress. Patient is alert and oriented.  height is 5' (1.524 m) and weight is 136 lb 9.6 oz (61.961 kg). Her temperature is 98.5 F (36.9 C). Her blood pressure is 96/69 and her pulse is 105. Marland Kitchen    General: Alert and oriented, in no acute distress HEENT: Head is normocephalic. Extraocular movements are intact.  Neck: Neck is supple, no palpable cervical or supraclavicular lymphadenopathy. Heart: Regular in rate and rhythm with no murmurs, rubs, or gallops. Chest: Clear to auscultation bilaterally, with no rhonchi, wheezes, or rales. Abdomen: Soft, slightly tender, nondistended, with no rigidity or guarding. Extremities: No cyanosis or edema.    Lab Findings: Lab Results  Component Value Date   WBC 4.7 11/06/2013   HGB 9.0* 11/06/2013   HCT 29.6* 11/06/2013   MCV 80.2 11/06/2013   PLT 320 11/06/2013    Radiographic Findings: See consult note. PET pending.   Impression/Plan:  It was a pleasure seeing her again today. We discussed the importance of nutrition in allowing her to tolerate adjuvant therapy.  We discussed getting a PET for staging.  I ordered this to be done ASAP and it will be done on 9-19.  Assuming she does not have STAGE IV disease, radiotherapy will be warranted for locoregional  control.    We discussed the risks, benefits, and side effects of radiotherapy. Side effects may include but not necessarily be limited to: nausea, fatigue, diarrhea, skin irritation, internal organ injury.  I will use techniques and technology to minimize the risks of side effects.  IMRT may be helpful for this, ie to limit dose to her kidneys, liver, bowel, cord.  No guarantees of treatment were given. A consent form was signed and placed in the patient's medical record. Simulation will be scheduled once her PET results are available. I will call her with the results.   Referral to social work.  F/u with nutrition as scheduled.  Dilaudid 41mL given today for pain.  _____________________________________   Eppie Gibson, MD

## 2013-11-08 NOTE — Progress Notes (Signed)
[place] [phone]   [phone]  OFFICE PROGRESS NOTE  Arnoldo Morale, MD [address] [place] [address]  DIAGNOSIS: Stage IIA (T1a, N2, M0) gastric adenocarcinoma diagnosed in July of 2015  PRIOR [place] post diagnostic laparoscopy, distal gastrectomy with billroth II anastamosis and feeding gastrojejunostomy tube under the care of Dr. Barry Dienes on 09/20/2013.   CURRENT THERAPY: adjuvant systemic chemotherapy with 5-FU 600 mg/M2 with leucovorin on days 1, 8, 15, 22, 29 and 36 every 8 weeks. First dose 11/19/2013.  INTERVAL HISTORY: Paula [name] 48 y.o. female returns to the clinic today for hospital followup visit. The patient was diagnosed with gastric adenocarcinoma in July of 2015 and it was felt initially to be early stage disease. She underwent surgical resection with distal gastrectomy with Billroth II anastomosis and feeding gastrojejunostomy tube under the care of Dr. Barry Dienes. The final pathology (Accession: [id number]) showed poorly differentiated adenocarcinoma with signet ring cells (diffuse type), the tumor invades the lamina propria. There was evidence for metastatic carcinoma in 3 of 5 lymph nodes in addition to metastatic carcinoma in one of 6 perigastric lymph nodes. Her postoperative course was complicated with persistent and intractable nausea and vomiting. She was readmitted to the hospital several times during the last few weeks. She was discharged from the hospital yesterday. Her current nausea medications include Ativan, Phenergan, Zofran as well as Zyprexa.  The patient is feeling a little bit better today and she is here for evaluation and discussion of her adjuvant treatment options. The tissue block were sent for molecular studies by [facility] and it showed no evidence for [facility] 1 or HER-2.  The patient lost a lot of weight during the last few weeks. Her nausea and vomiting are better controlled  today. She denied having any significant chest pain, shortness breath, cough or hemoptysis. She is scheduled to have a PET scan this weekend.  MEDICAL HISTORY: Past Medical History  Diagnosis Date  . Headache(784.0) 2011    Migraines  . Esophageal reflux     On omeprazole  . Costochondritis   . Hypertension     started around 2011  . Ovarian cyst   . Stomach cancer     ALLERGIES:  has No Known Allergies.  MEDICATIONS:  Current Outpatient Prescriptions  Medication Sig Dispense Refill  . carvedilol (COREG) 6.25 MG tablet Take 6.25 mg by mouth 2 (two) times daily with a meal.      . cloNIDine (CATAPRES - DOSED IN MG/24 HR) 0.2 mg/24hr patch Place 1 patch (0.2 mg total) onto the skin once a week.  10 patch  12  . feeding supplement, ENSURE COMPLETE, (ENSURE COMPLETE) LIQD Take 237 mLs by mouth 3 (three) times daily between meals.  20 Bottle  3  . fentaNYL (DURAGESIC - DOSED MCG/HR) 25 MCG/HR patch       . FLUoxetine (PROZAC) 10 MG capsule Take 1 capsule (10 mg total) by mouth daily.  30 capsule  3  . HYDROmorphone HCl (DILAUDID) 1 MG/ML LIQD Place 1 mL (1 mg total) into feeding tube every 2 (two) hours as needed for severe pain.  30 mL  0  . lidocaine (LIDODERM) 5 %       . LORazepam (ATIVAN) 1 MG tablet Take 1 tablet (1 mg total) by mouth 3 (three) times daily.  30 tablet  0  . metoCLOPramide (REGLAN) 10 MG tablet Take 1 tablet (10 mg total) by mouth 4 (  four) times daily.  120 tablet  3  . Multiple Vitamins-Minerals (MULTIVITAMIN & MINERAL PO) Take 1 tablet by mouth daily.      Marland Kitchen OLANZapine zydis (ZYPREXA) 5 MG disintegrating tablet Take 1 tablet (5 mg total) by mouth at bedtime.  30 tablet  3  . ondansetron (ZOFRAN-ODT) 8 MG disintegrating tablet Take 8 mg by mouth every 4 (four) hours as needed for nausea or vomiting.      . pantoprazole (PROTONIX) 40 MG tablet Take 1 tablet (40 mg total) by mouth 2 (two) times daily before a meal.  60 tablet  3  . polyethylene glycol (MIRALAX /  GLYCOLAX) packet Take 17 g by mouth 2 (two) times daily.  14 each  0  . scopolamine (TRANSDERM-SCOP) 1 MG/3DAYS Place 1 patch onto the skin every 3 (three) days.       No current facility-administered medications for this visit.    SURGICAL HISTORY:  Past Surgical History  Procedure Laterality Date  . Oophorectomy      Around 1985 left ovary removal  . Tonsillectomy      Around 1994  . Tubal ligation    . Esophagogastroduodenoscopy N/A 09/16/2013    Procedure: ESOPHAGOGASTRODUODENOSCOPY (EGD);  Surgeon: Wonda Horner, MD;  Location: [facility];  Service: Endoscopy;  Laterality: N/A;  . Laparoscopy N/A 09/20/2013    Procedure: DIAGNOSTIC LAPAROSCOPY,DISTAL GASTRECTOMY AND FEEDING GASTROJEJUNOSTOMY;  Surgeon: [name], MD;  Location: [facility];  Service: General;  Laterality: N/A;    REVIEW OF SYSTEMS:  Constitutional: positive for anorexia, fatigue and weight loss Eyes: negative Ears, nose, mouth, throat, and face: negative Respiratory: negative Cardiovascular: negative Gastrointestinal: positive for nausea Genitourinary:negative Integument/breast: negative Hematologic/lymphatic: negative Musculoskeletal:negative Neurological: negative Behavioral/Psych: negative Endocrine: negative Allergic/Immunologic: negative   PHYSICAL EXAMINATION: General appearance: alert, cooperative, fatigued and no distress Head: Normocephalic, without obvious abnormality, atraumatic Neck: no adenopathy, no JVD, supple, symmetrical, trachea midline and thyroid not enlarged, symmetric, no tenderness/mass/nodules Lymph nodes: Cervical, supraclavicular, and axillary nodes normal. Resp: clear to auscultation bilaterally Back: symmetric, no curvature. ROM normal. No CVA tenderness. Cardio: regular rate and rhythm, S1, S2 normal, no murmur, click, rub or gallop GI: soft, non-tender; bowel sounds normal; no masses,  no organomegaly Extremities: extremities normal, atraumatic, no cyanosis or  edema Neurologic: Alert and oriented X 3, normal strength and tone. Normal symmetric reflexes. Normal coordination and gait  ECOG PERFORMANCE STATUS: 1 - Symptomatic but completely ambulatory  Blood pressure 122/78, pulse 81, temperature 98.4 F (36.9 C), temperature source Oral, resp. rate 18, height 5' (1.524 m), weight 137 lb 4.8 oz (62.279 kg), last menstrual period 09/12/2013.  LABORATORY DATA: Lab Results  Component Value Date   WBC 4.7 11/06/2013   HGB 9.0* 11/06/2013   HCT 29.6* 11/06/2013   MCV 80.2 11/06/2013   PLT 320 11/06/2013      Chemistry      Component Value Date/Time   NA 139 11/06/2013 1312   NA 138 11/05/2013 0410   K 3.5 11/06/2013 1312   K 3.9 11/05/2013 0410   CL 102 11/05/2013 0410   CO2 25 11/06/2013 1312   CO2 26 11/05/2013 0410   BUN <3.0* 11/06/2013 1312   BUN 5* 11/05/2013 0410   CREATININE 0.8 11/06/2013 1312   CREATININE 0.81 11/05/2013 0410      Component Value Date/Time   CALCIUM 8.8 11/06/2013 1312   CALCIUM 9.2 11/05/2013 0410   ALKPHOS 115 11/06/2013 1312   ALKPHOS 85 11/04/2013 0410   AST 73*  11/06/2013 1312   AST 65* 11/04/2013 0410   ALT 154* 11/06/2013 1312   ALT 77* 11/04/2013 0410   BILITOT 0.48 11/06/2013 1312   BILITOT 0.8 11/04/2013 0410       RADIOGRAPHIC STUDIES: Dg Abd 1 View  10/11/2013   CLINICAL DATA:  Intolerance of jejunostomy tube feedings.  EXAM: ABDOMEN - 1 VIEW  COMPARISON:  Abdomen series 8/19 2015.  FINDINGS: Surgical sutures left upper quadrant. Small amount of residual contrast in the colon from prior studies. Jejunostomy tube was injected with nonionic contrast. Jejunostomy tube is widely patent. Proximal small bowel is nondilated.  IMPRESSION: 1. Patent jejunostomy tube.  Proximal small bowel nondilated. 2. No acute abnormality.   Electronically Signed   By: [place]   On: 10/11/2013 12:11   Ct Head W Wo Contrast  10/24/2013   CLINICAL DATA:  Stomach cancer.  Intractable nausea and vomiting.  EXAM: CT HEAD WITHOUT AND  WITH CONTRAST  TECHNIQUE: Contiguous axial images were obtained from the base of the skull through the vertex without and with intravenous contrast  CONTRAST:  127m OMNIPAQUE IOHEXOL 300 MG/ML  SOLN  COMPARISON:  MRI brain without and with contrast 09/19/2013.  FINDINGS: Previously noted left extra-axial hemorrhages near completely resolved. There is no evidence for new hemorrhage or significant mass effect. No acute cortical infarct or parenchymal hemorrhage, or mass lesion is present. The ventricles are of normal size.  The postcontrast images demonstrate no pathologic enhancement.  Paranasal sinuses and mastoid air cells are clear.  IMPRESSION: 1. Near complete resolution of left subdural hematoma without evidence for new hemorrhage. 2. No pathologic enhancement to suggest metastatic disease of the brain or meninges. Previously seen dural enhancement associated with the left-sided hemorrhage is beyond the resolution of this exam. 3. No acute intracranial abnormality.   Electronically Signed   By: CLawrence SantiagoM.D.   On: 10/24/2013 12:58   Mr Lumbar Spine W Wo Contrast  10/11/2013   CLINICAL DATA:  Gastric carcinoma.  Abnormal bone scan at L1.  EXAM: MRI LUMBAR SPINE WITHOUT AND WITH CONTRAST  TECHNIQUE: Multiplanar and multiecho pulse sequences of the lumbar spine were obtained without and with intravenous contrast.  CONTRAST:  15 mL MultiHance IV  COMPARISON:  Bone scan 10/11/2013  FINDINGS: Transitional anatomy.  L5 is partially incorporated into the sacrum.  Negative for fracture or mass lesion. Vertebral body signal is normal with particular attention L1. No evidence of bone marrow edema or mass. No enhancing lesions are seen postcontrast infusion. Etiology of the uptake on the bone scan is unclear.  Negative for fracture. Conus medullaris is normal. No significant degenerative change in the lumbar spine. No disc protrusion or spinal stenosis.  IMPRESSION: Negative for fracture or metastatic disease.  Bone marrow signal is normal. No explanation for the bone scan finding at L1.   Electronically Signed   By: CFranchot GalloM.D.   On: 10/11/2013 19:56   Nm Bone Scan Whole Body  10/11/2013   CLINICAL DATA:  Back pain.  Signet cell gastric carcinoma.  EXAM: NUCLEAR MEDICINE WHOLE BODY BONE SCAN  TECHNIQUE: Whole body anterior and posterior images were obtained approximately 3 hours after intravenous injection of radiopharmaceutical.  RADIOPHARMACEUTICALS:  24.5 mCi Technetium-99 MDP  COMPARISON:  CT chest, abdomen, and pelvis 09/13/2013  FINDINGS: There is a small focus of increased radiotracer uptake in the spine slightly left of midline at approximately L1, best seen on the anterior images. Uptake throughout the remainder of the axial  skeleton is symmetric. Uptake involving the left great toe is likely degenerative. Normal uptake is seen in the kidneys with excretion in the bladder.  IMPRESSION: Focal uptake in the spine at approximately L1, with considerations including degenerative change as well as metastasis. Further evaluation with MRI may be helpful.   Electronically Signed   By: Logan Bores   On: 10/11/2013 11:42   Ct Abdomen Pelvis W Contrast  11/01/2013   CLINICAL DATA:  Nausea and vomiting. Prior gastric dissection for gastric cancer.  EXAM: CT ABDOMEN AND PELVIS WITH CONTRAST  TECHNIQUE: Multidetector CT imaging of the abdomen and pelvis was performed using the standard protocol following bolus administration of intravenous contrast.  CONTRAST:  1 OMNIPAQUE IOHEXOL 300 MG/ML SOLN, 194m OMNIPAQUE IOHEXOL 300 MG/ML SOLN  COMPARISON:  Multiple exams, including 09/13/2013  FINDINGS: Lower chest: Stable interstitial thickening, mild volume loss, and potentially bronchiectasis in the lateral basal segment right lower lobe.  Hepatobiliary: 1.1 x 1.0 cm hypodense lesion likely with internal complexity or septation, segment 2 of the liver, image 11 series 2. Contracted gallbladder.  Spleen: Intact   Pancreas: Intact  Stomach/Bowel: Gastrostomy tube noted. There appears to be a GJ extension. The stomach is partially resected. The gastric section is new compared to 7/20 03/2013. Stranding in the surrounding omentum and subxiphoid region from prior laparotomy. I do not observe an abscess around the operative site. No locally recurrent malignancy is readily apparent. Appendix normal. Orally administered contrast extends through to the rectum.  Adrenals/urinary tract: Stable 5 mm hypodense lesion medially in the right kidney upper pole, likely a cyst but technically too small to characterize. Otherwise negative.  Vascular/Lymphatic: Unremarkable  Reproductive: Uterine fibroids.  Ovaries unremarkable.  Muskuloskeletal: Transitional lumbosacral vertebra probably represents L5.  Other: None  IMPRESSION: 1. Postoperative findings from recent partial gastrectomy. 2. 1.0 x 1.1 cm hypodense lesion in segment 2 of the liver. This lesion is not entirely simple in appears to have internal septation. An arterial phase enhancing 9 x 8 mm lesion is seen in this location on the CT chest from 09/13/2013, but there is no visible lesion differentiable from the hepatic parenchyma on the portal venous and delayed phase images of the CT abdomen from 09/11/2013. Differential diagnostic considerations include hypervascular metastatic lesion, focal nodular hyperplasia, and a small hepatic adenoma. Imaging workup options include hepatic protocol MRI and nuclear medicine PET-CT. 3. Ancillary findings include stable abnormal interstitial thickening in the right lower lobe and uterine fibroids.   Electronically Signed   By: WSherryl BartersM.D.   On: 11/01/2013 17:42   Ir Replc Gastro/colonic Tube Percut W/fluoro  10/29/2013   CLINICAL DATA:  Occluded gastrojejunostomy tube  EXAM: GASTROSTOMY CATHETER REPLACEMENT  FLUOROSCOPY TIME:  42 seconds.  MEDICATIONS AND MEDICAL HISTORY: None  ANESTHESIA/SEDATION: Moderate sedation time: 9 minutes   CONTRAST:  10 cc Omnipaque 300  PROCEDURE: The existing gastrojejunostomy tube was exchanged over a stiff glidewire for a new 18 French gastrojejunostomy tube. The balloon was insufflated with 10 cc saline. Contrast was injected.  FINDINGS: The jejunostomy tube portion traverses through a Billroth anastomosis and the tip is in the jejunum.  COMPLICATIONS: None  IMPRESSION: Successful gastrojejunostomy tube exchange.   Electronically Signed   By: AMaryclare BeanM.D.   On: 10/29/2013 08:41   Ir Mech Remov Obstruc Mat Any Colon Tube W/fluoro  11/04/2013   INDICATION: Clogged gastrojejunostomy tube  EXAM: Bedside unclogging of gastrojejunostomy tube.  MEDICATIONS: None  ANESTHESIA/SEDATION: None  CONTRAST:  None  COMPLICATIONS: None immediate.  PROCEDURE: Informed consent was obtained from the patient following an explanation of the procedure, risks, benefits and alternatives. The patient understands, agrees and consents for the procedure. All questions were addressed. A time out was performed prior to the initiation of the procedure.  And intervention radiology team was brought to the patient's room at the bedside. A 3 cc syringe was attached to the hub of the gastrojejunostomy tube and infusion of saline met no resistance during the infusion. Subsequently a 10 cc syringe was attached to the hub of the gastrojejunostomy tube with no resistance upon infusion of saline.  There was slight resistance with infusion of saline with a 30 cc catheter tip syringe.  The catheter was removed and a stiff Amplatz wire was used to cannulate the gastrojejunostomy tube. A pipe cleaner technique was used to clear the tube within a material. Again no resistance was met with 10 cc infusion through a 10 cc syringe. Mild resistance was encountered with infusion of saline through the 30 cc catheter tip syringe, however, the catheter was flushing.  The patient tolerated the procedure well. No complications. No blood loss.  IMPRESSION: Bedside  on clogging of gastrojejunostomy tube. The catheter flushes well with a 10 cc syringe, though there is some resistance with a large (30 cc catheter tip) syringe. At this time the gastrojejunostomy tube is operable, and the team will attempt feeding through the current tube.  PLAN: The care team will attempt to use the indwelling tube for subsequent feeds. If there are other troubles encounter with the gastrojejunostomy tube the team will Re contact interventional radiology for further evaluation and potential exchange of the indwelling tube.  Signed,  Dulcy Fanny. Earleen Newport, DO  [facility]  [facility]   Electronically Signed   By: Corrie Mckusick O.D.   On: 11/04/2013 15:46   Dg Abd Acute W/chest  10/21/2013   CLINICAL DATA:  Gastric carcinoma. Abdominal pain. Vomiting. Cough.  EXAM: ACUTE ABDOMEN SERIES (ABDOMEN 2 VIEW & CHEST 1 VIEW)  COMPARISON:  None.  FINDINGS: Percutaneous gastrostomy tube again seen in left upper quadrant. Surgical clips and staples noted. No evidence of dilated bowel loops or free air.  Mild elevation of right hemidiaphragm and right basilar pleural- parenchymal scarring appears stable. No evidence of acute infiltrate or edema. No evidence of pleural effusion. Heart size is within normal limits. No mass or lymphadenopathy identified.  IMPRESSION: Unremarkable bowel gas pattern.  No acute findings.  Stable right basilar pleural- parenchymal scarring. No active lung disease.   Electronically Signed   By: Earle Gell M.D.   On: 10/21/2013 14:22   Dg Abd Acute W/chest  10/20/2013   CLINICAL DATA:  Worsening abdominal pain after recent discharge. Recent diagnosis of gastric cancer.  EXAM: ACUTE ABDOMEN SERIES (ABDOMEN 2 VIEW & CHEST 1 VIEW)  COMPARISON:  Acute abdominal series 10/19/2013  FINDINGS: Similar appearance of right basilar scarring. Otherwise, the lungs are clear. Stable cardiac and mediastinal contours. No free air. Percutaneous gastrostomy  tube appears in similar position and projects over the stomach. Staple lines are noted in the left upper quadrant suggesting partial gastric resection. No evidence of ascites or bowel obstruction. No acute osseous abnormality.  IMPRESSION: 1. No acute cardiopulmonary process. 2. Stable right basilar atelectasis versus scarring. 3. Stable position of gastrostomy tube. 4. Nonobstructed bowel gas pattern. 5. No evidence of free air or ascites.   Electronically Signed   By: Dellis Filbert.D.  On: 10/20/2013 14:54   Dg Abd Acute W/chest  10/19/2013   CLINICAL DATA:  48 year old female with abdominal pain and nausea. Recent diagnosis of gastric cancer and surgery.  EXAM: ACUTE ABDOMEN SERIES (ABDOMEN 2 VIEW & CHEST 1 VIEW)  COMPARISON:  10/13/2013 and prior radiographs  FINDINGS: The cardiomediastinal silhouette is unremarkable.  Mild right basilar atelectasis/scarring again identified.  No airspace disease, pleural effusion or pneumothorax.  Surgical sutures overlying the left abdomen noted.  Jejunostomy catheter is again noted.  The bowel gas pattern is unremarkable without evidence of bowel obstruction or pneumoperitoneum.  No suspicious calcifications are identified.  The bony structures are unremarkable.  IMPRESSION: No evidence of acute abnormality.  Normal bowel gas pattern.   Electronically Signed   By: Hassan Rowan M.D.   On: 10/19/2013 19:26   Dg Abd Acute W/chest  10/13/2013   CLINICAL DATA:  Abdominal pain and vomiting. Discharged today from a 30 day hospital stay.  EXAM: ACUTE ABDOMEN SERIES (ABDOMEN 2 VIEW & CHEST 1 VIEW)  COMPARISON:  10/11/2013  FINDINGS: Right PICC catheter with tip over the cavoatrial junction. Slightly shallow inspiration. Fibrosis or atelectasis in the right lung base. No focal consolidation. No blunting of costophrenic angles. Normal heart size and pulmonary vascularity.  Postoperative changes in the left upper quadrant with gastrostomy tube present. Residual contrast material  in the colon without distention. No small bowel distention. No free intra-abdominal air. No abnormal air-fluid levels. No radiopaque stones. Visualized bones appear intact.  IMPRESSION: Fibrosis or atelectasis in the right lung base. Nonobstructive bowel gas pattern. No free air. Residual contrast material in the colon.   Electronically Signed   By: Lucienne Capers M.D.   On: 10/13/2013 21:15   Dg Abd Portable 2v  10/22/2013   CLINICAL DATA:  Status post partial gastrectomy for gastric cancer with Billroth II and gastrojejunostomy  EXAM: PORTABLE ABDOMEN - 2 VIEW  COMPARISON:  Abdominal radiographs dated 10/21/2013. CT abdomen pelvis dated 09/13/2013.  FINDINGS: Patient is status post distal gastrectomy with gastrojejunostomy.  Initially, 25 mL contrast was administered via the J-tube. Contrast opacifies the proximal jejunum as well as the residual stomach.  Subsequently, 25 mL contrast was administered via the G-tube lumen. Additional contrast opacifies the residual stomach, confirming intragastric placement.  The distal tip of the J-tube is outlined on the second radiograph and likely resides in the proximal aspect of the jejunum just distal to the gastrojejunostomy.  IMPRESSION: Status post distal gastrectomy with gastrojejunostomy.  Intragastric placement of the gastrostomy tube is confirmed.  Jejunostomy tube likely resides in the proximal jejunum just distal to the gastrojejunostomy.   Electronically Signed   By: Julian Hy M.D.   On: 10/22/2013 16:08   Dg Ugi W/water Sol Cm  10/23/2013   CLINICAL DATA:  History of gastric cancer, status post gastrojejunostomy.  EXAM: WATER SOLUBLE UPPER GI SERIES  TECHNIQUE: Single-column upper GI series was performed using water soluble contrast.  CONTRAST:  53m OMNIPAQUE IOHEXOL 300 MG/ML  SOLN  COMPARISON:  None.  FLUOROSCOPY TIME:  2 min 5 seconds  FINDINGS: On the scout radiograph a gastrostomy tube is in place. The contrast material placed within the  stomach on 10/22/2013 has progressed into the colon.  The patient ingested 50 cc of water-soluble contrast material. The esophagus is patent. There is no evidence of esophageal mass or stricture. Postoperative appearance of the stomach compatible with gastrojejunostomy. The gastro jejunal anastomosis is patent.  When the patient is in the recumbent orientation  several episodes of gastroesophageal reflux noted.  IMPRESSION: 1. Patent esophagus and gastrojejunal anastomosis. 2. Reflux.   Electronically Signed   By: Kerby Moors M.D.   On: 10/23/2013 13:00    ASSESSMENT AND PLAN: this is a very pleasant 48 years old [place] American female recently diagnosed with a stage II a gastric adenocarcinoma status post resection with lymph node dissection. Her postoperative course was complicated with intractable nausea and vomiting which is currently better controlled. I recommended for the patient to have the PET scan performed as scheduled in the next few days to rule out any metastatic disease. If there is no evidence for metastatic disease, I would consider the patient for a course of adjuvant chemotherapy in the form of weekly 5-FU and leucovorin for 2 cycles followed by concurrent chemoradiation with Xeloda followed by 2 more cycles of systemic chemotherapy with 5-FU and leucovorin. I discussed with the patient adverse effect of the chemotherapy including but not limited to alopecia, myelosuppression, nausea and vomiting, peripheral neuropathy, liver or renal dysfunction. I will refer the patient to Dr. Barry Dienes for consideration of Port-A-Cath placement. She is expected to start the first cycle of her treatment on 11/19/2013. She would come back for follow up visit at that time. The patient was seen recently by Dr. Isidore Moos for discussion of the radiotherapy options. She was advised to call immediately if she has any concerning symptoms in the interval.  The patient voices understanding of current disease  status and treatment options and is in agreement with the current care plan.  All questions were answered. The patient knows to call the clinic with any problems, questions or concerns. We can certainly see the patient much sooner if necessary.  I spent 20 minutes counseling the patient face to face. The total time spent in the appointment was 30 minutes.  Disclaimer: This note was dictated with voice recognition software. Similar sounding words can inadvertently be transcribed and may not be corrected upon review.

## 2013-11-09 ENCOUNTER — Ambulatory Visit (HOSPITAL_COMMUNITY)
Admission: RE | Admit: 2013-11-09 | Discharge: 2013-11-09 | Disposition: A | Discharge status: Home / Self Care | Payer: No Typology Code available for payment source | Source: Ambulatory Visit | Attending: Radiation Oncology | Admitting: Radiation Oncology

## 2013-11-09 ENCOUNTER — Encounter (HOSPITAL_COMMUNITY): Payer: Self-pay

## 2013-11-09 DIAGNOSIS — C169 Malignant neoplasm of stomach, unspecified: Secondary | ICD-10-CM | POA: Diagnosis not present

## 2013-11-09 LAB — GLUCOSE, CAPILLARY: GLUCOSE-CAPILLARY: 95 mg/dL (ref 70–99)

## 2013-11-09 MED ORDER — FLUDEOXYGLUCOSE F - 18 (FDG) INJECTION: 7.6000 | Freq: Once | INTRAVENOUS | Status: AC | PRN

## 2013-11-11 ENCOUNTER — Encounter (HOSPITAL_BASED_OUTPATIENT_CLINIC_OR_DEPARTMENT_OTHER): Payer: Self-pay | Admitting: Emergency Medicine

## 2013-11-11 ENCOUNTER — Telehealth: Payer: Self-pay | Admitting: Internal Medicine

## 2013-11-11 ENCOUNTER — Emergency Department (HOSPITAL_BASED_OUTPATIENT_CLINIC_OR_DEPARTMENT_OTHER)
Admission: EM | Admit: 2013-11-11 | Discharge: 2013-11-11 | Disposition: A | Discharge status: Home / Self Care | Payer: No Typology Code available for payment source | Attending: Emergency Medicine | Admitting: Emergency Medicine

## 2013-11-11 DIAGNOSIS — K219 Gastro-esophageal reflux disease without esophagitis: Secondary | ICD-10-CM | POA: Insufficient documentation

## 2013-11-11 DIAGNOSIS — Z8742 Personal history of other diseases of the female genital tract: Secondary | ICD-10-CM | POA: Diagnosis not present

## 2013-11-11 DIAGNOSIS — Z9889 Other specified postprocedural states: Secondary | ICD-10-CM | POA: Diagnosis not present

## 2013-11-11 DIAGNOSIS — C169 Malignant neoplasm of stomach, unspecified: Secondary | ICD-10-CM | POA: Diagnosis not present

## 2013-11-11 DIAGNOSIS — R079 Chest pain, unspecified: Secondary | ICD-10-CM | POA: Insufficient documentation

## 2013-11-11 DIAGNOSIS — R109 Unspecified abdominal pain: Secondary | ICD-10-CM | POA: Insufficient documentation

## 2013-11-11 DIAGNOSIS — R011 Cardiac murmur, unspecified: Secondary | ICD-10-CM | POA: Diagnosis not present

## 2013-11-11 DIAGNOSIS — Z9079 Acquired absence of other genital organ(s): Secondary | ICD-10-CM | POA: Insufficient documentation

## 2013-11-11 DIAGNOSIS — I10 Essential (primary) hypertension: Secondary | ICD-10-CM | POA: Insufficient documentation

## 2013-11-11 DIAGNOSIS — Z9851 Tubal ligation status: Secondary | ICD-10-CM | POA: Diagnosis not present

## 2013-11-11 DIAGNOSIS — Z79899 Other long term (current) drug therapy: Secondary | ICD-10-CM | POA: Insufficient documentation

## 2013-11-11 LAB — COMPREHENSIVE METABOLIC PANEL
ALBUMIN: 3.5 g/dL (ref 3.5–5.2)
ALT: 75 U/L — AB (ref 0–35)
AST: 27 U/L (ref 0–37)
Alkaline Phosphatase: 109 U/L (ref 39–117)
Anion gap: 11 (ref 5–15)
BUN: 4 mg/dL — ABNORMAL LOW (ref 6–23)
CO2: 27 meq/L (ref 19–32)
CREATININE: 0.8 mg/dL (ref 0.50–1.10)
Calcium: 9.4 mg/dL (ref 8.4–10.5)
Chloride: 101 mEq/L (ref 96–112)
GFR calc Af Amer: >90 mL/min (ref >90)
GFR, EST NON AFRICAN AMERICAN: 87 mL/min — AB (ref >90)
Glucose, Bld: 123 mg/dL — ABNORMAL HIGH (ref 70–99)
POTASSIUM: 4.4 meq/L (ref 3.7–5.3)
Sodium: 139 mEq/L (ref 137–147)
Total Bilirubin: 0.4 mg/dL (ref 0.3–1.2)
Total Protein: 7.2 g/dL (ref 6.0–8.3)

## 2013-11-11 LAB — TROPONIN I

## 2013-11-11 LAB — CBC
HEMATOCRIT: 29.9 % — AB (ref 36.0–46.0)
Hemoglobin: 9.5 g/dL — ABNORMAL LOW (ref 12.0–15.0)
MCH: 25.2 pg — ABNORMAL LOW (ref 26.0–34.0)
MCHC: 31.8 g/dL (ref 30.0–36.0)
MCV: 79.3 fL (ref 78.0–100.0)
Platelets: 423 10*3/uL — ABNORMAL HIGH (ref 150–400)
RBC: 3.77 MIL/uL — AB (ref 3.87–5.11)
RDW: 16 % — ABNORMAL HIGH (ref 11.5–15.5)
WBC: 6.8 10*3/uL (ref 4.0–10.5)

## 2013-11-11 MED ORDER — ONDANSETRON HCL 4 MG/2ML IJ SOLN
INTRAMUSCULAR | Status: AC
  Filled 2013-11-11: qty 2

## 2013-11-11 MED ORDER — HYDROMORPHONE HCL 1 MG/ML IJ SOLN
1.0000 mg | Freq: Once | INTRAMUSCULAR | Status: AC
  Administered 2013-11-11: 1 mg via INTRAVENOUS
  Filled 2013-11-11: qty 1

## 2013-11-11 MED ORDER — ONDANSETRON HCL 4 MG/2ML IJ SOLN
4.0000 mg | Freq: Once | INTRAMUSCULAR | Status: AC
  Administered 2013-11-11: 4 mg via INTRAVENOUS

## 2013-11-11 NOTE — ED Notes (Signed)
Peg tube flusched with out difficulty no leaking around insertion site, 250 green bile with drawn from tube

## 2013-11-11 NOTE — ED Provider Notes (Signed)
CSN: [id number]     Arrival date & time 11/11/13  1346 History   First MD Initiated Contact with Patient 11/11/13 1406     Chief Complaint  Patient presents with  . Chest Pain     (Consider location/radiation/quality/duration/timing/severity/associated sxs/prior Treatment) Patient is a 48 y.o. female presenting with chest pain. The history is provided by the patient. No language interpreter was used.  Chest Pain Pain location:  Epigastric Pain quality: aching   Pain radiates to:  Does not radiate Pain radiates to the back: no   Pain severity:  Severe Timing:  Constant Progression:  Worsening Chronicity:  New Relieved by:  Nothing Worsened by:  Nothing tried Ineffective treatments:  None tried Associated symptoms: abdominal pain   Risk factors: no high cholesterol   Pt initially told nurse her peg tube is stopped up and she could not take her pain medication.  Pt reports she is out of dilaudid and out of fentynl patches.  Pt reports this is her usual pain  Past Medical History  Diagnosis Date  . Headache(784.0) 2011    Migraines  . Esophageal reflux     On omeprazole  . Costochondritis   . Hypertension     started around 2011  . Ovarian cyst   . Stomach cancer    Past Surgical History  Procedure Laterality Date  . Oophorectomy      Around 1985 left ovary removal  . Tonsillectomy      Around 1994  . Tubal ligation    . Esophagogastroduodenoscopy N/A 09/16/2013    Procedure: ESOPHAGOGASTRODUODENOSCOPY (EGD);  Surgeon: Wonda Horner, MD;  Location: [facility];  Service: Endoscopy;  Laterality: N/A;  . Laparoscopy N/A 09/20/2013    Procedure: DIAGNOSTIC LAPAROSCOPY,DISTAL GASTRECTOMY AND FEEDING GASTROJEJUNOSTOMY;  Surgeon: [name], MD;  Location: [facility];  Service: General;  Laterality: N/A;   Family History  Problem Relation Age of Onset  . Hypertension Mother   . Diabetes Mellitus II Mother   . Hypertension Sister   . Hypertension Brother   . Heart failure  Maternal Grandmother   . Hypertension Maternal Grandfather    History  Substance Use Topics  . Smoking status: Former Research scientist (life sciences)  . Smokeless tobacco: Never Used     Comment: Stopped 2004  . Alcohol Use: No   OB History   Grav Para Term Preterm Abortions TAB SAB Ect Mult Living                 Review of Systems  Cardiovascular: Positive for chest pain.  Gastrointestinal: Positive for abdominal pain.  All other systems reviewed and are negative.     Allergies  Review of patient's allergies indicates no known allergies.  Home Medications   Prior to Admission medications   Medication Sig Start Date End Date Taking? Authorizing Provider  HYDROmorphone HCl (DILAUDID) 1 MG/ML LIQD Place 1 mL (1 mg total) into feeding tube every 2 (two) hours as needed for severe pain. 11/05/13  Yes Venetia Maxon Rama, MD  carvedilol (COREG) 6.25 MG tablet Take 6.25 mg by mouth 2 (two) times daily with a meal.    Historical Provider, MD  cloNIDine (CATAPRES - DOSED IN MG/24 HR) 0.2 mg/24hr patch Place 1 patch (0.2 mg total) onto the skin once a week. 11/05/13   Venetia Maxon Rama, MD  feeding supplement, ENSURE COMPLETE, (ENSURE COMPLETE) LIQD Take 237 mLs by mouth 3 (three) times daily between meals. 11/05/13   Venetia Maxon Rama, MD  fentaNYL ([place] -  DOSED MCG/HR) 25 MCG/HR patch  10/18/13   Historical Provider, MD  FLUoxetine (PROZAC) 10 MG capsule Take 1 capsule (10 mg total) by mouth daily. 11/05/13   Venetia Maxon Rama, MD  lidocaine (LIDODERM) 5 %  10/17/13   Historical Provider, MD  LORazepam (ATIVAN) 1 MG tablet Take 1 tablet (1 mg total) by mouth 3 (three) times daily. 11/05/13   Venetia Maxon Rama, MD  metoCLOPramide (REGLAN) 10 MG tablet Take 1 tablet (10 mg total) by mouth 4 (four) times daily. 11/05/13   Venetia Maxon Rama, MD  Multiple Vitamins-Minerals (MULTIVITAMIN & MINERAL PO) Take 1 tablet by mouth daily.    Historical Provider, MD  OLANZapine zydis (ZYPREXA) 5 MG disintegrating tablet Take 1 tablet  (5 mg total) by mouth at bedtime. 11/05/13   Christina P Rama, MD  ondansetron (ZOFRAN-ODT) 8 MG disintegrating tablet Take 8 mg by mouth every 4 (four) hours as needed for nausea or vomiting.    Historical Provider, MD  pantoprazole (PROTONIX) 40 MG tablet Take 1 tablet (40 mg total) by mouth 2 (two) times daily before a meal. 11/05/13   Christina P Rama, MD  polyethylene glycol (MIRALAX / GLYCOLAX) packet Take 17 g by mouth 2 (two) times daily. 11/05/13   Venetia Maxon Rama, MD  scopolamine (TRANSDERM-SCOP) 1 MG/3DAYS Place 1 patch onto the skin every 3 (three) days.    Historical Provider, MD   BP 152/90  Pulse 108  Temp(Src) 98 F (36.7 C) (Oral)  Resp 18  Ht 5\' 2"  (1.575 m)  Wt 128 lb (58.06 kg)  BMI 23.41 kg/m2  SpO2 99%  LMP 07/13/2013 Physical Exam  Nursing note and vitals reviewed. Constitutional: She is oriented to person, place, and time. She appears well-developed and well-nourished.  HENT:  Head: Normocephalic.  Eyes: Pupils are equal, round, and reactive to light.  Cardiovascular: Normal rate.   Pulmonary/Chest: Effort normal.  Abdominal: Soft.  Musculoskeletal: Normal range of motion.  Neurological: She is alert and oriented to person, place, and time. She has normal reflexes.  Skin: Skin is warm.  Psychiatric: She has a normal mood and affect.    ED Course  Procedures (including critical care time) Labs Review Labs Reviewed  CBC - Abnormal; Notable for the following:    RBC 3.77 (*)    Hemoglobin 9.5 (*)    HCT 29.9 (*)    MCH 25.2 (*)    RDW 16.0 (*)    Platelets 423 (*)    All other components within normal limits  COMPREHENSIVE METABOLIC PANEL - Abnormal; Notable for the following:    Glucose, Bld 123 (*)    BUN 4 (*)    ALT 75 (*)    GFR calc non Af Amer 87 (*)    All other components within normal limits    Imaging Review No results found.   EKG Interpretation None      MDM   Final diagnoses:  Gastric cancer    Iv pain medications.    Pt advised to discuss pain medications with her MD tomorrow    Fransico Meadow, PA-C 11/11/13 1508

## 2013-11-11 NOTE — ED Notes (Signed)
Pt reports she is out of fentanyl patches, MD made aware

## 2013-11-11 NOTE — ED Notes (Signed)
Family at bedside. 

## 2013-11-11 NOTE — ED Notes (Signed)
Pt states " why didn't she given me a whole dose of dilaudid" i informed her she received 1 mg

## 2013-11-11 NOTE — Discharge Instructions (Signed)
Gastric Cancer Gastric cancer is a tumor which starts as a growth in your stomach. Cancer is a group of many related diseases that begin in cells, the building blocks of the body. Normally, cells grow and divide to produce more cells only when the body needs them. Sometimes, cells keep dividing when new cells are not needed. These extra cells may form a mass of tissue called a growth or tumor. Tumors can be either benign (not cancerous) or malignant (cancerous). Cancer can begin in any organ or tissue of the body. The original tumor (where the tumor started out) is called the primary cancer and is usually named for where it begins.  Several types of cancer can occur in the stomach. Adenocarcinoma is the most common, accounting for about 95% of gastric tumors. Other cancer types include carcinoid tumors, lymphoma, or gastrointestinal stromal cell tumors (GISTs).  CAUSES  Though the exact cause of gastric cancer is not known, there are several known risk factors:  Age over 57.  Female sex.  Race: more common in Asian, Pacific Islander, Hispanic, and African American people.  Diet high in smoked, salted, or pickled foods.  Tobacco and alcohol use.  History of stomach surgery, chronic gastritis, gastric polyps, or pernicious anemia.  Stomach infection with H. pylori bacteria (which also increases risk for ulcers).  Genetic factors including family history and blood type A. Note that very few people with risk factors actually develop gastric cancer. SYMPTOMS   Pain.  Loss of appetite.  Problems swallowing.  Nausea and vomiting.  Vomiting blood.  Abdominal pain.  Excessive gas or belching.  Weight loss.  General health problems. DIAGNOSIS  Your caregiver may suspect gastric cancer based on your symptoms and your physical exam. Further testing can diagnose gastric cancer. This may include looking for blood in your stool. Gastroscopy (looking at your stomach through an instrument like  a thin flexible telescope; also called endoscopy) may also be done. Biopsies can be done if an abnormal growth is found. This is the removal of a small piece of tissue from your stomach if your caregiver notices abnormalities or growths there. The biopsy is looked at under a microscope by a specialist who can tell if cancer is present. If cancer is confirmed, other tests may be needed to see if the cancer has spread beyond the stomach. TREATMENT   Surgical removal of the stomach (gastrectomy) is the only curative treatment. Sometimes only part of the stomach needs to be removed, depending on location of the cancer. Gastrectomy can be done if the cancer is found before it has spread beyond the stomach.  Radiation therapy and chemotherapy may be helpful. Chemotherapy and radiation therapy given after surgery may improve cure rates or make you feel better.  Antibiotics are sometimes used for H. pylori infection.  Advanced techniques to remove or destroy cancer without surgery are being researched.  Feeding tubes or bypass surgery may help if food becomes blocked. The possibility of curing your gastric cancer depends on the type of tumor, the location of the tumor, and whether the tumor has spread beyond the stomach. If your cancer cannot be cured, treatment may slow the progression of the disease. Treatments are also available to address pain or other symptoms of cancer. HOME CARE INSTRUCTIONS   Your caregiver may prescribe a specific type of diet. If you have had surgery, then a dietitian may help you with an eating plan. Avoid red meats, processed meats, and salty, smoked, or pickled foods.  Take any prescribed medications as directed. Do not use more pain medication than directed.  You do not need to limit your activity unless instructed by your caregiver.  Avoid alcohol and tobacco use.  Keep appointments for tests with your caregiver or specialists. SEEK MEDICAL CARE IF:   You have  problems eating.  You have problems tolerating your medications.  You continue to lose weight despite your treatments. SEEK IMMEDIATE MEDICAL CARE IF:   You have uncontrolled nausea, vomiting, or diarrhea.  You have vomiting with blood or coffee-grounds-type material.  You have had chemotherapy and you have a fever.  You have uncontrolled pain. Document Released: 11/12/2003 Document Revised: 06/24/2013 Document Reviewed: 02/19/2008 [facility] Patient Information 2015 [place], [place]. This information is not intended to replace advice given to you by your health care provider. Make sure you discuss any questions you have with your health care provider.

## 2013-11-11 NOTE — ED Notes (Signed)
Chest pain that started at 1030am today.  Vomiting and dizziness.

## 2013-11-11 NOTE — Telephone Encounter (Signed)
added pt appts per MD staff msg to add on nxt visit...done....pt will get new sched 9.23

## 2013-11-11 NOTE — ED Provider Notes (Signed)
History/physical exam/procedure(s) were performed by non-physician practitioner and as supervising physician I was immediately available for consultation/collaboration. I have reviewed all notes and am in agreement with care and plan.   Shaune Pollack, MD 11/11/13 [phone]

## 2013-11-11 NOTE — ED Notes (Signed)
pa at bedside. 

## 2013-11-12 ENCOUNTER — Other Ambulatory Visit: Payer: Self-pay | Admitting: *Deleted

## 2013-11-12 ENCOUNTER — Ambulatory Visit: Payer: No Typology Code available for payment source | Admitting: Internal Medicine

## 2013-11-12 ENCOUNTER — Other Ambulatory Visit: Payer: No Typology Code available for payment source

## 2013-11-12 ENCOUNTER — Telehealth: Payer: Self-pay | Admitting: *Deleted

## 2013-11-12 DIAGNOSIS — C169 Malignant neoplasm of stomach, unspecified: Secondary | ICD-10-CM

## 2013-11-12 MED ORDER — HYDROMORPHONE HCL 1 MG/ML PO LIQD: 1.0000 mg | ORAL | Status: DC | PRN

## 2013-11-12 MED ORDER — FENTANYL 25 MCG/HR TD PT72: 25.0000 ug | MEDICATED_PATCH | TRANSDERMAL | Status: DC

## 2013-11-12 NOTE — Telephone Encounter (Signed)
Pt called stated she needs to havea  Refill on her pain medication.  ASked her what medication she is taking.  She states she ran out of her fentanyl patch last week but the 69mcg is not strong enough.  She did not call the office for a refill when she ran out.  She has dilaudid 27ml every 2 hours prn via PEG tube.  She states she is taking it 1x daily.  Informed her that if she has been taking her dilaudid only 60ml daily then she would not be out yet (63ml given on 9/15).  Pt states "well I may have taken more then what was prescribed all at one time".  Asked her how much she would take at a time.  She states "I don't remember".  Per Dr Vista Mink, okay to give her a refill for now of the fentanyl 54mcg and dilaidud 6ml and to take as prescribed.  Will reassess her on 10/6.  Pt verbalized understanding.

## 2013-11-13 ENCOUNTER — Other Ambulatory Visit: Payer: Self-pay | Admitting: *Deleted

## 2013-11-13 ENCOUNTER — Encounter: Payer: Self-pay | Admitting: *Deleted

## 2013-11-13 ENCOUNTER — Other Ambulatory Visit: Payer: No Typology Code available for payment source

## 2013-11-13 DIAGNOSIS — C169 Malignant neoplasm of stomach, unspecified: Secondary | ICD-10-CM

## 2013-11-13 MED ORDER — LIDOCAINE-PRILOCAINE 2.5-2.5 % EX CREA: 1.0000 "application " | TOPICAL_CREAM | CUTANEOUS | Status: DC | PRN

## 2013-11-13 NOTE — Progress Notes (Signed)
[facility] Psychosocial Distress Screening Clinical Social Work  Clinical Social Work was referred by distress screening protocol.  The patient scored a 5 on the Psychosocial Distress Thermometer which indicates moderate distress. Clinical Social Worker met with patient after chemo education class to assess for distress and other psychosocial needs. [name] shared concerns regarding applying for food stamps, disability, and completing advance directives.  CSW and patient briefly discussed emotional causes of distress including adjustment to illness.  The patient shared she is well supported by friends and family.  ONCBCN DISTRESS SCREENING 11/06/2013  Screening Type Initial Screening  Mark the number that describes how much distress you have been experiencing in the past week 5  Family Problem type Other (comment)  Emotional problem type Adjusting to illness  Spiritual/Religous concerns type Loss of sense of purpose  Physical Problem type Pain;Loss of appetitie;Sexual problems  Referral to clinical social work Yes    Clinical Social Worker follow up needed: Yes.    If yes, follow up plan:  CSW scheduled patient to meet in [place] office tomorrow 11/14/13 at 0930.  Polo Riley, MSW, LCSW, OSW-C Clinical Social Worker [facility] [phone]

## 2013-11-14 ENCOUNTER — Encounter: Payer: Self-pay | Admitting: Genetic Counselor

## 2013-11-14 ENCOUNTER — Other Ambulatory Visit: Payer: No Typology Code available for payment source

## 2013-11-14 ENCOUNTER — Ambulatory Visit (HOSPITAL_BASED_OUTPATIENT_CLINIC_OR_DEPARTMENT_OTHER): Payer: No Typology Code available for payment source | Admitting: Genetic Counselor

## 2013-11-14 DIAGNOSIS — C163 Malignant neoplasm of pyloric antrum: Secondary | ICD-10-CM

## 2013-11-14 DIAGNOSIS — Z809 Family history of malignant neoplasm, unspecified: Secondary | ICD-10-CM | POA: Insufficient documentation

## 2013-11-14 DIAGNOSIS — C169 Malignant neoplasm of stomach, unspecified: Secondary | ICD-10-CM

## 2013-11-14 NOTE — Progress Notes (Signed)
Patient Name: Paula [name] Patient Age: 48 y.o. Encounter Date: 11/14/2013  Referring Physician: Curt Bears, MD  Primary Care Provider: Arnoldo Morale, MD    Paula [name], a 48 y.o. female, is being seen at the [facility] due to a personal and family history of cancer. She presents to clinic today to discuss the possibility of a hereditary predisposition to cancer and discuss whether genetic testing is warranted.  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 She is s/p extensive surgery and is receiving chemotherapy.   She reports having a yearly mammogram, clinical breast exam and gynecologic exam.   Past Medical History  Diagnosis Date  . Headache(784.0) 2011    Migraines  . Esophageal reflux     On omeprazole  . Costochondritis   . Hypertension     started around 2011  . Ovarian cyst   . Stomach cancer   . Family history of cancer     Past Surgical History  Procedure Laterality Date  . Oophorectomy      Around 1985 left ovary removal  . Tonsillectomy      Around 1994  . Tubal ligation    . Esophagogastroduodenoscopy N/A 09/16/2013    Procedure: ESOPHAGOGASTRODUODENOSCOPY (EGD);  Surgeon: Wonda Horner, MD;  Location: [facility];  Service: Endoscopy;  Laterality: N/A;  . Laparoscopy N/A 09/20/2013    Procedure: DIAGNOSTIC LAPAROSCOPY,DISTAL GASTRECTOMY AND FEEDING GASTROJEJUNOSTOMY;  Surgeon: [name], MD;  Location: [facility];  Service: General;  Laterality: N/A;    History   Social History  . Marital Status: Married    Spouse Name: N/A    Number of Children: 2  . Years of Education: N/A   Occupational History  .  [employer]   Social History Main Topics  . Smoking status: Former Research scientist (life sciences)  . Smokeless tobacco: Never Used     Comment: Stopped 2004  . Alcohol Use: No  . Drug Use: No  . Sexual Activity: Not on  file   Other Topics Concern  . Not on file   Social History Narrative  . No narrative on file     FAMILY HISTORY:   During the visit, a 4-generation pedigree was obtained. Family tree will be sent for scanning and will be in EPIC under the Media tab.  Significant diagnoses include the following:  Family History  Problem Relation Age of Onset  . Hypertension Mother   . Diabetes Mellitus II Mother   . Hypertension Sister   . Hypertension Brother   . Heart failure Maternal Grandmother   . Hypertension Maternal Grandfather   . Prostate cancer Paternal Uncle     living at 41  . Gastric cancer Cousin 42    pat first cousin; daughter of pat uncle living with prostate ca   . Gastric cancer Cousin     mat first cousin; daughter of mat aunt who died in 18s due to heart issues  . Prostate cancer Paternal Uncle     deceased  . Prostate cancer Paternal Uncle     deceased  . Lung cancer Paternal Uncle     smoker; deceased    Additionally, [name] has two sons (age 39 and 56) and a full sister (age 89). She has a paternal half-sister (age 75). Her mother (age 76) and her 5 maternal uncles and 2 maternal aunts are all cancer-free. One maternal aunt who  died in her 51s unrelated to cancer, had a daughter who had gastric cancer diagnosed in her 49s. Both maternal grandparents died in their 43s, cancer-free.  [name] father died in an accident at age 42. He had 8 brothers and 5 sisters; cancers are noted above. Other than the one paternal cousin with gastric cancer, there are no other cousins with any cancers in her large family. Her paternal grandmother died in her 59s during childbirth and her paternal grandfather died in his 5s, cancer-free.  [name] ancestry is African American. There is no known Jewish ancestry and no consanguinity.  ASSESSMENT AND PLAN: Paula [name] is a 48 y.o. female with a personal and family history of gastric cancer. While she was  diagnosed at a young age, this history is not highly suggestive of a hereditary predisposition to cancer since the family history is in 3rd-degree relatives (cousins) on opposite sides of the family. However, genetic testing is indicated to rule out a mutation, especially in the CDH1 gene. We reviewed the characteristics, features and inheritance patterns of hereditary cancer syndromes. We also discussed genetic testing, including the appropriate family members to test, the process of testing, insurance coverage and implications of results.   Paula [name] wished to pursue genetic testing and a blood sample will be sent to [facility] for analysis of the 17 genes on the [employer] gene panel. We discussed the implications of a positive, negative and/ or Variant of Uncertain Significance (VUS) result. Results should be available in approximately 4-5 weeks, at which point we will contact her and address implications for her as well as address genetic testing for at-risk family members, if needed.    We encouraged [name] to remain in contact with Cancer Genetics annually so that we can update the family history and inform her of any changes in cancer genetics and testing that may be of benefit for this family. Ms.  Paula [name] questions were answered to her satisfaction today.   Thank you for the referral and allowing [place] to share in the care of your patient.   The patient was seen for a total of 30 minutes, greater than 50% of which was spent face-to-face counseling. This patient was discussed with the overseeing provider who agrees with the above.   Steele Berg, MS, [place] Certified Genetic Counseor phone: [phone] [email or web address]

## 2013-11-15 ENCOUNTER — Encounter (HOSPITAL_COMMUNITY): Payer: Self-pay | Admitting: *Deleted

## 2013-11-15 ENCOUNTER — Other Ambulatory Visit (INDEPENDENT_AMBULATORY_CARE_PROVIDER_SITE_OTHER): Payer: Self-pay | Admitting: General Surgery

## 2013-11-18 ENCOUNTER — Ambulatory Visit (HOSPITAL_COMMUNITY)
Admission: RE | Admit: 2013-11-18 | Discharge: 2013-11-18 | Disposition: A | Discharge status: Home / Self Care | Payer: No Typology Code available for payment source | Source: Ambulatory Visit | Attending: General Surgery | Admitting: General Surgery

## 2013-11-18 ENCOUNTER — Encounter (HOSPITAL_COMMUNITY): Payer: No Typology Code available for payment source | Admitting: Certified Registered"

## 2013-11-18 ENCOUNTER — Ambulatory Visit (HOSPITAL_COMMUNITY): Payer: No Typology Code available for payment source | Admitting: Certified Registered"

## 2013-11-18 ENCOUNTER — Encounter (HOSPITAL_COMMUNITY)
Admission: RE | Disposition: A | Discharge status: Home / Self Care | Payer: Self-pay | Source: Ambulatory Visit | Attending: General Surgery

## 2013-11-18 ENCOUNTER — Ambulatory Visit
Admission: RE | Admit: 2013-11-18 | Payer: No Typology Code available for payment source | Source: Ambulatory Visit | Admitting: Radiation Oncology

## 2013-11-18 ENCOUNTER — Ambulatory Visit (HOSPITAL_COMMUNITY): Payer: No Typology Code available for payment source

## 2013-11-18 ENCOUNTER — Telehealth: Payer: Self-pay | Admitting: *Deleted

## 2013-11-18 ENCOUNTER — Telehealth: Payer: Self-pay

## 2013-11-18 ENCOUNTER — Encounter (HOSPITAL_COMMUNITY): Payer: Self-pay | Admitting: *Deleted

## 2013-11-18 ENCOUNTER — Ambulatory Visit: Admission: RE | Admit: 2013-11-18 | Payer: No Typology Code available for payment source | Source: Ambulatory Visit

## 2013-11-18 DIAGNOSIS — Z903 Acquired absence of stomach [part of]: Secondary | ICD-10-CM | POA: Insufficient documentation

## 2013-11-18 DIAGNOSIS — C169 Malignant neoplasm of stomach, unspecified: Secondary | ICD-10-CM

## 2013-11-18 HISTORY — DX: Cardiac murmur, unspecified: R01.1

## 2013-11-18 HISTORY — PX: PORTACATH PLACEMENT: SHX2246

## 2013-11-18 LAB — BASIC METABOLIC PANEL
Anion gap: 11 (ref 5–15)
BUN: 8 mg/dL (ref 6–23)
CO2: 27 mEq/L (ref 19–32)
CREATININE: 0.93 mg/dL (ref 0.50–1.10)
Calcium: 9.4 mg/dL (ref 8.4–10.5)
Chloride: 100 mEq/L (ref 96–112)
GFR calc Af Amer: 84 mL/min — ABNORMAL LOW (ref >90)
GFR, EST NON AFRICAN AMERICAN: 73 mL/min — AB (ref >90)
Glucose, Bld: 120 mg/dL — ABNORMAL HIGH (ref 70–99)
Potassium: 4.1 mEq/L (ref 3.7–5.3)
Sodium: 138 mEq/L (ref 137–147)

## 2013-11-18 LAB — CBC
HEMATOCRIT: 31.5 % — AB (ref 36.0–46.0)
Hemoglobin: 9.7 g/dL — ABNORMAL LOW (ref 12.0–15.0)
MCH: 24.6 pg — ABNORMAL LOW (ref 26.0–34.0)
MCHC: 30.8 g/dL (ref 30.0–36.0)
MCV: 79.7 fL (ref 78.0–100.0)
Platelets: 349 10*3/uL (ref 150–400)
RBC: 3.95 MIL/uL (ref 3.87–5.11)
RDW: 16.1 % — AB (ref 11.5–15.5)
WBC: 5.7 10*3/uL (ref 4.0–10.5)

## 2013-11-18 LAB — HCG, SERUM, QUALITATIVE: Preg, Serum: NEGATIVE

## 2013-11-18 SURGERY — INSERTION, TUNNELED CENTRAL VENOUS DEVICE, WITH PORT
Anesthesia: General | Laterality: Left

## 2013-11-18 MED ORDER — PROPOFOL 10 MG/ML IV BOLUS
INTRAVENOUS | Status: AC
  Filled 2013-11-18: qty 20

## 2013-11-18 MED ORDER — DEXAMETHASONE SODIUM PHOSPHATE 10 MG/ML IJ SOLN
INTRAMUSCULAR | Status: DC | PRN
  Administered 2013-11-18: 10 mg via INTRAVENOUS

## 2013-11-18 MED ORDER — 0.9 % SODIUM CHLORIDE (POUR BTL) OPTIME
TOPICAL | Status: DC | PRN
  Administered 2013-11-18: 1000 mL

## 2013-11-18 MED ORDER — LACTATED RINGERS IV SOLN: INTRAVENOUS | Status: DC

## 2013-11-18 MED ORDER — CARVEDILOL 6.25 MG PO TABS
6.2500 mg | ORAL_TABLET | Freq: Two times a day (BID) | ORAL | Status: DC
  Administered 2013-11-18: 6.25 mg via ORAL
  Filled 2013-11-18 (×2): qty 1

## 2013-11-18 MED ORDER — LIDOCAINE HCL 1 % IJ SOLN
INTRAMUSCULAR | Status: AC
  Filled 2013-11-18: qty 20

## 2013-11-18 MED ORDER — FENTANYL CITRATE 0.05 MG/ML IJ SOLN
INTRAMUSCULAR | Status: AC
  Filled 2013-11-18: qty 2

## 2013-11-18 MED ORDER — ONDANSETRON HCL 4 MG/2ML IJ SOLN
INTRAMUSCULAR | Status: DC | PRN
  Administered 2013-11-18: 4 mg via INTRAVENOUS

## 2013-11-18 MED ORDER — FENTANYL CITRATE 0.05 MG/ML IJ SOLN
25.0000 ug | INTRAMUSCULAR | Status: DC | PRN
  Administered 2013-11-18 (×2): 50 ug via INTRAVENOUS

## 2013-11-18 MED ORDER — HEPARIN SOD (PORK) LOCK FLUSH 100 UNIT/ML IV SOLN
INTRAVENOUS | Status: AC
  Filled 2013-11-18: qty 5

## 2013-11-18 MED ORDER — BUPIVACAINE-EPINEPHRINE 0.25% -1:200000 IJ SOLN
INTRAMUSCULAR | Status: DC | PRN
  Administered 2013-11-18: 30 mL

## 2013-11-18 MED ORDER — LIDOCAINE HCL (CARDIAC) 20 MG/ML IV SOLN
INTRAVENOUS | Status: AC
  Filled 2013-11-18: qty 5

## 2013-11-18 MED ORDER — LIDOCAINE HCL (CARDIAC) 20 MG/ML IV SOLN
INTRAVENOUS | Status: DC | PRN
  Administered 2013-11-18: 50 mg via INTRAVENOUS

## 2013-11-18 MED ORDER — DEXAMETHASONE SODIUM PHOSPHATE 10 MG/ML IJ SOLN
INTRAMUSCULAR | Status: AC
  Filled 2013-11-18: qty 1

## 2013-11-18 MED ORDER — LACTATED RINGERS IV SOLN
INTRAVENOUS | Status: DC | PRN
  Administered 2013-11-18: 07:00:00 via INTRAVENOUS

## 2013-11-18 MED ORDER — ONDANSETRON HCL 4 MG/2ML IJ SOLN
INTRAMUSCULAR | Status: AC
  Filled 2013-11-18: qty 2

## 2013-11-18 MED ORDER — FENTANYL CITRATE 0.05 MG/ML IJ SOLN
INTRAMUSCULAR | Status: DC | PRN
  Administered 2013-11-18 (×2): 50 ug via INTRAVENOUS

## 2013-11-18 MED ORDER — PROMETHAZINE HCL 25 MG/ML IJ SOLN: 6.2500 mg | INTRAMUSCULAR | Status: DC | PRN

## 2013-11-18 MED ORDER — HYDROMORPHONE HCL 1 MG/ML PO LIQD: 1.0000 mg | ORAL | Status: DC | PRN

## 2013-11-18 MED ORDER — CEFAZOLIN SODIUM-DEXTROSE 2-3 GM-% IV SOLR
2.0000 g | INTRAVENOUS | Status: AC
  Administered 2013-11-18: 2 g via INTRAVENOUS

## 2013-11-18 MED ORDER — HEPARIN SOD (PORK) LOCK FLUSH 100 UNIT/ML IV SOLN
INTRAVENOUS | Status: DC | PRN
  Administered 2013-11-18: 500 [IU] via INTRAVENOUS

## 2013-11-18 MED ORDER — PROPOFOL 10 MG/ML IV BOLUS
INTRAVENOUS | Status: DC | PRN
  Administered 2013-11-18: 150 mg via INTRAVENOUS

## 2013-11-18 MED ORDER — MIDAZOLAM HCL 2 MG/2ML IJ SOLN
INTRAMUSCULAR | Status: AC
  Filled 2013-11-18: qty 2

## 2013-11-18 MED ORDER — BUPIVACAINE-EPINEPHRINE (PF) 0.25% -1:200000 IJ SOLN
INTRAMUSCULAR | Status: AC
  Filled 2013-11-18: qty 30

## 2013-11-18 MED ORDER — SODIUM CHLORIDE 0.9 % IR SOLN
Freq: Once | Status: AC
  Administered 2013-11-18: 09:00:00
  Filled 2013-11-18: qty 1.2

## 2013-11-18 MED ORDER — LIDOCAINE HCL 1 % IJ SOLN
INTRAMUSCULAR | Status: DC | PRN
  Administered 2013-11-18: 20 mL

## 2013-11-18 MED ORDER — MIDAZOLAM HCL 5 MG/5ML IJ SOLN
INTRAMUSCULAR | Status: DC | PRN
  Administered 2013-11-18: 2 mg via INTRAVENOUS

## 2013-11-18 SURGICAL SUPPLY — 35 items
BAG DECANTER FOR FLEXI CONT (MISCELLANEOUS) ×2 IMPLANT
BLADE HEX COATED 2.75 (ELECTRODE) ×2 IMPLANT
BLADE SURG 15 STRL LF DISP TIS (BLADE) ×1 IMPLANT
BLADE SURG 15 STRL SS (BLADE) ×1
BLADE SURG SZ11 CARB STEEL (BLADE) ×2 IMPLANT
CHLORAPREP W/TINT 26ML (MISCELLANEOUS) ×2 IMPLANT
DECANTER SPIKE VIAL GLASS SM (MISCELLANEOUS) ×2 IMPLANT
DERMABOND ADVANCED (GAUZE/BANDAGES/DRESSINGS) ×1
DERMABOND ADVANCED .7 DNX12 (GAUZE/BANDAGES/DRESSINGS) ×1 IMPLANT
DRAPE C-ARM 42X120 X-RAY (DRAPES) ×2 IMPLANT
DRAPE LAPAROTOMY TRNSV 102X78 (DRAPE) ×2 IMPLANT
DRAPE UTILITY XL STRL (DRAPES) ×2 IMPLANT
DRSG TEGADERM 4X4.75 (GAUZE/BANDAGES/DRESSINGS) ×2 IMPLANT
ELECT REM PT RETURN 9FT ADLT (ELECTROSURGICAL) ×2
ELECTRODE REM PT RTRN 9FT ADLT (ELECTROSURGICAL) ×1 IMPLANT
GAUZE SPONGE 4X4 12PLY STRL (GAUZE/BANDAGES/DRESSINGS) ×2 IMPLANT
GAUZE SPONGE 4X4 16PLY XRAY LF (GAUZE/BANDAGES/DRESSINGS) IMPLANT
GLOVE BIO SURGEON STRL SZ 6 (GLOVE) ×2 IMPLANT
GLOVE INDICATOR 6.5 STRL GRN (GLOVE) ×2 IMPLANT
GOWN STRL REUS W/TWL 2XL LVL3 (GOWN DISPOSABLE) ×2 IMPLANT
GOWN STRL REUS W/TWL XL LVL3 (GOWN DISPOSABLE) ×2 IMPLANT
KIT BASIN OR (CUSTOM PROCEDURE TRAY) ×2 IMPLANT
KIT PORT POWER 8FR ISP CVUE (Catheter) ×2 IMPLANT
NEEDLE HYPO 22GX1.5 SAFETY (NEEDLE) ×2 IMPLANT
PACK BASIC VI WITH GOWN DISP (CUSTOM PROCEDURE TRAY) ×2 IMPLANT
PENCIL BUTTON HOLSTER BLD 10FT (ELECTRODE) ×2 IMPLANT
SUT MNCRL AB 4-0 PS2 18 (SUTURE) ×2 IMPLANT
SUT PROLENE 2 0 SH DA (SUTURE) ×4 IMPLANT
SUT VIC AB 3-0 SH 27 (SUTURE) ×1
SUT VIC AB 3-0 SH 27X BRD (SUTURE) ×1 IMPLANT
SYR CONTROL 10ML LL (SYRINGE) ×2 IMPLANT
SYRINGE 10CC LL (SYRINGE) ×2 IMPLANT
TOWEL OR 17X26 10 PK STRL BLUE (TOWEL DISPOSABLE) ×2 IMPLANT
TOWEL OR NON WOVEN STRL DISP B (DISPOSABLE) ×2 IMPLANT
YANKAUER SUCT BULB TIP 10FT TU (MISCELLANEOUS) IMPLANT

## 2013-11-18 NOTE — Telephone Encounter (Signed)
CALLED PATIENT TO ASK ABOUT COMING FOR TODAY'S APPT., LVM FOR A RETURN CALL

## 2013-11-18 NOTE — Transfer of Care (Signed)
Immediate Anesthesia Transfer of Care Note  Patient: Paula [name]  Procedure(s) Performed: Procedure(s) (LRB): INSERTION PORT-A-CATH (Left)  Patient Location: PACU  Anesthesia Type: General  Level of Consciousness: sedated, patient cooperative and responds to stimulation  Airway & Oxygen Therapy: Patient Spontanous Breathing and Patient connected to face mask oxgen  Post-op Assessment: Report given to PACU RN and Post -op Vital signs reviewed and stable  Post vital signs: Reviewed and stable  Complications: No apparent anesthesia complications

## 2013-11-18 NOTE — H&P (View-Only) (Signed)
Patient interviewed and examined, agree with PA note above. I doubt a surgical problem with recent negative upper GI series. CT scan has been ordered to evaluate persistent vomiting. Will followup tomorrow.  Edward Jolly MD, FACS  11/01/2013 7:16 PM

## 2013-11-18 NOTE — Anesthesia Preprocedure Evaluation (Signed)
Anesthesia Evaluation  Patient identified by MRN, date of birth, ID band Patient awake  General Assessment Comment:Gastric carcinoma s/p Distal gastrectomy/GJ ([place] II) 09/20/2013    Malnutrition of moderate degree    Reviewed: Allergy & Precautions, H&P , NPO status , Patient's Chart, lab work & pertinent test results  Airway Mallampati: II TM Distance: >3 FB Neck ROM: Full    Dental no notable dental hx.    Pulmonary neg pulmonary ROS, former smoker,  breath sounds clear to auscultation  Pulmonary exam normal       Cardiovascular hypertension, Rhythm:Regular Rate:Normal     Neuro/Psych negative neurological ROS  negative psych ROS   GI/Hepatic Neg liver ROS, PUD, GERD-  ,  Endo/Other  negative endocrine ROS  Renal/GU negative Renal ROS  negative genitourinary   Musculoskeletal negative musculoskeletal ROS (+)   Abdominal   Peds negative pediatric ROS (+)  Hematology  (+) anemia ,   Anesthesia Other Findings   Reproductive/Obstetrics negative OB ROS                           Anesthesia Physical Anesthesia Plan  ASA: III  Anesthesia Plan: General   Post-op Pain Management:    Induction: Intravenous  Airway Management Planned: LMA  Additional Equipment:   Intra-op Plan:   Post-operative Plan:   Informed Consent: I have reviewed the patients History and Physical, chart, labs and discussed the procedure including the risks, benefits and alternatives for the proposed anesthesia with the patient or authorized representative who has indicated his/her understanding and acceptance.   Dental advisory given  Plan Discussed with: CRNA and Surgeon  Anesthesia Plan Comments:         Anesthesia Quick Evaluation

## 2013-11-18 NOTE — Op Note (Signed)
PREOPERATIVE DIAGNOSIS:  Gastric cancer     POSTOPERATIVE DIAGNOSIS:  Same     PROCEDURE: left subclavian port placement, Bard ClearVue  Power Port, MRI safe, 8-French.      SURGEON:  [name], MD      ANESTHESIA:  General   FINDINGS:  Good venous return, easy flush, and tip of the catheter and   SVC 20 cm.      SPECIMEN:  None.      ESTIMATED BLOOD LOSS:  Minimal.      COMPLICATIONS:  None known.      PROCEDURE:  Pt was identified in the holding area and taken to   the operating room, where patient was placed supine on the operating room   table.  General LMA anesthesia was induced.  Patient's arms were tucked and the upper   chest and neck were prepped and draped in sterile fashion.  Time-out was   performed according to the surgical safety check list.  When all was   correct, we continued.   Local anesthetic was administered over this   area at the angle of the clavicle.  The vein was accessed with 2 passes of the needle. There was good venous return and the wire passed easily with no ectopy.   Fluoroscopy was used to confirm that the wire was in the vena cava.      The patient was placed back level and the area for the pocket was anethetized   with local anesthetic.  A 3-cm transverse incision was made with a #15   blade.  Cautery was used to divide the subcutaneous tissues down to the   pectoralis muscle.  An [employer] retractor was used to elevate the skin   while a pocket was created on top of the pectoralis fascia.  The port   was placed into the pocket to confirm that it was of adequate size.  The   catheter was preattached to the port.  The port was then secured to the   pectoralis fascia with four 2-0 Prolene sutures.  These were clamped and   not tied down yet.    The catheter was tunneled through to the wire exit   site.  The catheter was placed along the wire to determine what length it should be to be in the SVC.  The catheter was cut at 20 cm.  The tunneler  sheath and dilator were passed over the wire and the dilator and wire were removed.  The catheter was advanced through the tunneler sheath and the tunneler sheath was pulled away.  Care was taken to keep the catheter in the tunneler sheath as this occurred. This was advanced and the tunneler sheath was removed.  There was good venous   return and easy flush of the catheter.  The Prolene sutures were tied   down to the pectoral fascia.  The skin was reapproximated using 3-0   Vicryl interrupted deep dermal sutures.    Fluoroscopy was used to re-confirm good position of the catheter.  The skin   was then closed using 4-0 Monocryl in a subcuticular fashion.  The port was flushed with concentrated heparin flush as well.  The wounds were then cleaned, dried, and dressed with Dermabond.  The patient was awakened from anesthesia and taken to the PACU in stable condition.  Needle, sponge, and instrument counts were correct.               [name], MD

## 2013-11-18 NOTE — H&P (View-Only) (Signed)
[facility] Progress Note     Subjective: Pt says she's doing better, but then starts crying and saying she keeps getting nauseated and vomiting.  She asks me what its from and why she isn't getting better.  She asks if she has a kink in her bowels.  I tell here everything from the surgery is doing well.  She knows she's stressed from her medical problems and being stuck in the hospital and she's stressed from feeling bad.  We discussed that she has to work with the medical team because she's not getting enough nutrition and she's refusing our help including TF, TPN, and supplements.  We discuss how the psych meds will take time to work 4-6 weeks, but that she should continue these in hopes to help diminish her symptoms.  Objective: Vital signs in last 24 hours: Temp:  [98.1 F (36.7 C)-98.7 F (37.1 C)] 98.5 F (36.9 C) (09/11 0352) Pulse Rate:  [106-126] 112 (09/11 0812) Resp:  [19-20] 19 (09/10 2010) BP: (155-181)/(89-105) 175/92 mmHg (09/11 0812) SpO2:  [100 %] 100 % (09/11 0352) Weight:  [135 lb 11.2 oz (61.553 kg)] 135 lb 11.2 oz (61.553 kg) (09/11 0352) Last BM Date: 10/28/13  Intake/Output from previous day: 09/10 0701 - 09/11 0700 In: 50 [P.O.:30] Out: 100 [Drains:100] Intake/Output this shift:    PE: Gen:  Alert, NAD, pleasant Abd: Soft, NT/ND, +BS, no HSM, abdominal scars noted and well healed, GJ tube in place, clean   Lab Results:   Recent Labs  10/30/13 0400  WBC 4.0  HGB 8.3*  HCT 26.4*  PLT 263   BMET  Recent Labs  10/30/13 0400  NA 138  K 3.7  CL 100  CO2 30  GLUCOSE 108*  BUN 3*  CREATININE 0.83  CALCIUM 8.9   PT/INR No results found for this basename: LABPROT, INR,  in the last 72 hours CMP     Component Value Date/Time   NA 138 10/30/2013 0400   K 3.7 10/30/2013 0400   CL 100 10/30/2013 0400   CO2 30 10/30/2013 0400   GLUCOSE 108* 10/30/2013 0400   BUN 3* 10/30/2013 0400   CREATININE 0.83 10/30/2013 0400   CALCIUM 8.9 10/30/2013  0400   PROT 5.8* 10/28/2013 0835   ALBUMIN 2.8* 10/28/2013 0835   AST 27 10/28/2013 0835   ALT 45* 10/28/2013 0835   ALKPHOS 87 10/28/2013 0835   BILITOT 0.7 10/28/2013 0835   GFRNONAA 83* 10/30/2013 0400   GFRAA >90 10/30/2013 0400   Lipase     Component Value Date/Time   LIPASE 35 10/21/2013 1200       Studies/Results: No results found.  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 UGI neg on 9/2. No evidence of gastric emptying problems. Pt refuses supplementation and not getting sufficient PO. She is refusing tube feeding on and off. She is tolerating some D2 diet, but still having vomiting. 2. Waxes and wanes, still c/o intermittent nausea, vomiting, and pain.  Hopefully psych meds will help her long term as it seems she has a diagnosis of somatization disorder.  Discussed with her the impact of stress on her medical conditions and how this can be impacting her N/V and worsened pain symptoms.    3. Advance diet as tolerated, can re-attempt TF if  she is agreeable.  Goals of care meeting today with palliative. 4. Limit narcotics unless this become a goal of palliative. 5. Continue antiemetics, ice, heat therapy  6. Think she can be discharged soon since no evidence of gastric emptying. Her mental health issues are definitely affecting her medical status.  No other surgical recommendations as she seems to be doing well from this perspective.   7. Flush gastrojejunostomy tube (BOTH Gastric & jejunal ports) with 30 mL of warm tap water air every 6 hours to prevent the tube from being clogged.  No medicines through a jejunostomy port.  If need to give medicines through the tube because the patient cannot or refuses to take it orally, provide in elixir form or crushed through gastric port. 8. We will see her again on  Monday if she's still here, but okay to d/c from our perspective    LOS: 10 days    DORT, [facility] 11/01/2013, 9:16 AM Pager: [phone]

## 2013-11-18 NOTE — Anesthesia Postprocedure Evaluation (Signed)
  Anesthesia Post-op Note  Patient: Paula [name]  Procedure(s) Performed: Procedure(s) (LRB): INSERTION PORT-A-CATH (Left)  Patient Location: PACU  Anesthesia Type: General  Level of Consciousness: awake and alert   Airway and Oxygen Therapy: Patient Spontanous Breathing  Post-op Pain: mild  Post-op Assessment: Post-op Vital signs reviewed, Patient's Cardiovascular Status Stable, Respiratory Function Stable, Patent Airway and No signs of Nausea or vomiting  Last Vitals:  Filed Vitals:   11/18/13 0930  BP: 115/84  Pulse: 94  Temp:   Resp: 14    Post-op Vital Signs: stable   Complications: No apparent anesthesia complications

## 2013-11-18 NOTE — Interval H&P Note (Signed)
History and Physical Interval Note:  11/18/2013 8:02 AM  Paula [name]  has presented today for surgery, with the diagnosis of gastric cancer  The various methods of treatment have been discussed with the patient and family. After consideration of risks, benefits and other options for treatment, the patient has consented to  Procedure(s): INSERTION PORT-A-CATH (N/A) as a surgical intervention .  The patient's history has been reviewed, patient examined, no change in status, stable for surgery.  I have reviewed the patient's chart and labs.  Questions were answered to the patient's satisfaction.     [name]

## 2013-11-18 NOTE — Discharge Instructions (Signed)
[facility] °Office Phone Number [phone] ° ° POST OP INSTRUCTIONS ° °Always review your discharge instruction sheet given to you by the facility where your surgery was performed. ° °IF YOU HAVE DISABILITY OR FAMILY LEAVE FORMS, YOU MUST BRING THEM TO THE OFFICE FOR PROCESSING.  DO NOT GIVE THEM TO YOUR DOCTOR. ° °1. A prescription for pain medication may be given to you upon discharge.  Take your pain medication as prescribed, if needed.  If narcotic pain medicine is not needed, then you may take acetaminophen (Tylenol) or ibuprofen (Advil) as needed. °2. Take your usually prescribed medications unless otherwise directed °3. If you need a refill on your pain medication, please contact your pharmacy.  They will contact our office to request authorization.  Prescriptions will not be filled after 5pm or on week-ends. °4. You should eat very light the first 24 hours after surgery, such as soup, crackers, pudding, etc.  Resume your normal diet the day after surgery °5. It is common to experience some constipation if taking pain medication after surgery.  Increasing fluid intake and taking a stool softener will usually help or prevent this problem from occurring.  A mild laxative (Milk of Magnesia or Miralax) should be taken according to package directions if there are no bowel movements after 48 hours. °6. You may shower in 48 hours.  The surgical glue will flake off in 2-3 weeks.   °7. ACTIVITIES:  No strenuous activity or heavy lifting for 1 week.   °a. You may drive when you no longer are taking prescription pain medication, you can comfortably wear a seatbelt, and you can safely maneuver your car and apply brakes. °b. RETURN TO WORK:  __________to be determined._______________ °You should see your doctor in the office for a follow-up appointment approximately three-four weeks after your surgery.   ° °WHEN TO CALL YOUR DOCTOR: °1. Fever over 101.0 °2. Nausea and/or vomiting. °3. Extreme swelling  or bruising. °4. Continued bleeding from incision. °5. Increased pain, redness, or drainage from the incision. ° °The clinic staff is available to answer your questions during regular business hours.  Please don’t hesitate to call and ask to speak to one of the nurses for clinical concerns.  If you have a medical emergency, go to the nearest emergency room or call 911.  A surgeon from [facility] is always on call at the hospital. ° °For further questions, please visit [email or web address]  ° °

## 2013-11-18 NOTE — Telephone Encounter (Signed)
Ct sim appointment rescheduled for Wednesday 11/20/13.arrive at 2:15 pm for port access for contrast.May eat light meal but nothing by mouth after 11:00am.Patient knows keep appointment for lab and chemotherapy on 11/19/13 as to get in one treatment prior to  Starting radiation.

## 2013-11-19 ENCOUNTER — Ambulatory Visit: Payer: No Typology Code available for payment source

## 2013-11-19 ENCOUNTER — Telehealth: Payer: Self-pay | Admitting: *Deleted

## 2013-11-19 ENCOUNTER — Other Ambulatory Visit (HOSPITAL_BASED_OUTPATIENT_CLINIC_OR_DEPARTMENT_OTHER): Payer: No Typology Code available for payment source

## 2013-11-19 ENCOUNTER — Other Ambulatory Visit: Payer: Self-pay

## 2013-11-19 ENCOUNTER — Other Ambulatory Visit: Payer: Self-pay | Admitting: Medical Oncology

## 2013-11-19 ENCOUNTER — Ambulatory Visit (HOSPITAL_BASED_OUTPATIENT_CLINIC_OR_DEPARTMENT_OTHER): Payer: No Typology Code available for payment source

## 2013-11-19 ENCOUNTER — Encounter (HOSPITAL_COMMUNITY): Payer: Self-pay | Admitting: General Surgery

## 2013-11-19 DIAGNOSIS — C169 Malignant neoplasm of stomach, unspecified: Secondary | ICD-10-CM

## 2013-11-19 DIAGNOSIS — C163 Malignant neoplasm of pyloric antrum: Secondary | ICD-10-CM

## 2013-11-19 DIAGNOSIS — R112 Nausea with vomiting, unspecified: Secondary | ICD-10-CM

## 2013-11-19 DIAGNOSIS — Z5111 Encounter for antineoplastic chemotherapy: Secondary | ICD-10-CM

## 2013-11-19 LAB — CBC WITH DIFFERENTIAL/PLATELET
BASO%: 0.1 % (ref 0.0–2.0)
Basophils Absolute: 0 10*3/uL (ref 0.0–0.1)
EOS ABS: 0 10*3/uL (ref 0.0–0.5)
EOS%: 0.5 % (ref 0.0–7.0)
HCT: 27.9 % — ABNORMAL LOW (ref 34.8–46.6)
HGB: 8.5 g/dL — ABNORMAL LOW (ref 11.6–15.9)
LYMPH#: 2.8 10*3/uL (ref 0.9–3.3)
LYMPH%: 35.1 % (ref 14.0–49.7)
MCH: 24.1 pg — ABNORMAL LOW (ref 25.1–34.0)
MCHC: 30.5 g/dL — AB (ref 31.5–36.0)
MCV: 79.3 fL — ABNORMAL LOW (ref 79.5–101.0)
MONO#: 0.8 10*3/uL (ref 0.1–0.9)
MONO%: 10 % (ref 0.0–14.0)
NEUT%: 54.3 % (ref 38.4–76.8)
NEUTROS ABS: 4.3 10*3/uL (ref 1.5–6.5)
PLATELETS: 280 10*3/uL (ref 145–400)
RBC: 3.52 10*6/uL — AB (ref 3.70–5.45)
RDW: 16.3 % — ABNORMAL HIGH (ref 11.2–14.5)
WBC: 7.9 10*3/uL (ref 3.9–10.3)

## 2013-11-19 LAB — COMPREHENSIVE METABOLIC PANEL (CC13)
ALT: 25 U/L (ref 0–55)
AST: 15 U/L (ref 5–34)
Albumin: 3.3 g/dL — ABNORMAL LOW (ref 3.5–5.0)
Alkaline Phosphatase: 84 U/L (ref 40–150)
Anion Gap: 8 mEq/L (ref 3–11)
BUN: 12.1 mg/dL (ref 7.0–26.0)
CALCIUM: 9.6 mg/dL (ref 8.4–10.4)
CO2: 27 meq/L (ref 22–29)
Chloride: 105 mEq/L (ref 98–109)
Creatinine: 1 mg/dL (ref 0.6–1.1)
Glucose: 102 mg/dl (ref 70–140)
Potassium: 3.8 mEq/L (ref 3.5–5.1)
SODIUM: 140 meq/L (ref 136–145)
TOTAL PROTEIN: 6.5 g/dL (ref 6.4–8.3)
Total Bilirubin: 0.55 mg/dL (ref 0.20–1.20)

## 2013-11-19 MED ORDER — SODIUM CHLORIDE 0.9 % IJ SOLN
10.0000 mL | INTRAMUSCULAR | Status: DC | PRN
  Filled 2013-11-19: qty 10

## 2013-11-19 MED ORDER — LORAZEPAM 1 MG PO TABS: 1.0000 mg | ORAL_TABLET | Freq: Three times a day (TID) | ORAL | Status: DC

## 2013-11-19 MED ORDER — ONDANSETRON 8 MG PO TBDP: 8.0000 mg | ORAL_TABLET | Freq: Three times a day (TID) | ORAL | Status: DC | PRN

## 2013-11-19 MED ORDER — HEPARIN SOD (PORK) LOCK FLUSH 100 UNIT/ML IV SOLN
500.0000 [IU] | Freq: Once | INTRAVENOUS | Status: DC | PRN
  Filled 2013-11-19: qty 5

## 2013-11-19 MED ORDER — SODIUM CHLORIDE 0.9 % IJ SOLN
10.0000 mL | INTRAMUSCULAR | Status: DC | PRN
  Administered 2013-11-19: 10 mL via INTRAVENOUS
  Filled 2013-11-19: qty 10

## 2013-11-19 MED ORDER — SODIUM CHLORIDE 0.9 % IV SOLN
Freq: Once | INTRAVENOUS | Status: AC
  Administered 2013-11-19: 10:00:00 via INTRAVENOUS

## 2013-11-19 MED ORDER — LORAZEPAM 2 MG/ML IJ SOLN
INTRAMUSCULAR | Status: AC
  Filled 2013-11-19: qty 1

## 2013-11-19 MED ORDER — DEXTROSE 5 % IV SOLN
500.0000 mg/m2 | Freq: Once | INTRAVENOUS | Status: AC
  Administered 2013-11-19: 810 mg via INTRAVENOUS
  Filled 2013-11-19: qty 40.5

## 2013-11-19 MED ORDER — FLUOROURACIL CHEMO INJECTION 2.5 GM/50ML
600.0000 mg/m2 | Freq: Once | INTRAVENOUS | Status: AC
  Administered 2013-11-19: 950 mg via INTRAVENOUS
  Filled 2013-11-19: qty 19

## 2013-11-19 NOTE — Patient Instructions (Signed)

## 2013-11-19 NOTE — Telephone Encounter (Signed)
PT.'S MOBILE VOICE MAIL WAS FULL. UNABLE TO LEAVE A MESSAGE.

## 2013-11-20 ENCOUNTER — Ambulatory Visit
Admission: RE | Admit: 2013-11-20 | Discharge: 2013-11-20 | Disposition: A | Discharge status: Home / Self Care | Payer: No Typology Code available for payment source | Source: Ambulatory Visit | Attending: Radiation Oncology | Admitting: Radiation Oncology

## 2013-11-20 ENCOUNTER — Other Ambulatory Visit: Payer: Self-pay | Admitting: Radiation Oncology

## 2013-11-20 VITALS — BP 112/76 | HR 91 | Temp 98.2°F | Ht 62.0 in

## 2013-11-20 DIAGNOSIS — Z510 Encounter for antineoplastic radiation therapy: Secondary | ICD-10-CM | POA: Diagnosis not present

## 2013-11-20 DIAGNOSIS — C169 Malignant neoplasm of stomach, unspecified: Secondary | ICD-10-CM

## 2013-11-20 DIAGNOSIS — C163 Malignant neoplasm of pyloric antrum: Secondary | ICD-10-CM

## 2013-11-20 MED ORDER — HEPARIN SOD (PORK) LOCK FLUSH 100 UNIT/ML IV SOLN
500.0000 [IU] | Freq: Once | INTRAVENOUS | Status: AC
  Administered 2013-11-20: 500 [IU] via INTRAVENOUS

## 2013-11-20 MED ORDER — SODIUM CHLORIDE 0.9 % IJ SOLN
10.0000 mL | Freq: Once | INTRAMUSCULAR | Status: AC
  Administered 2013-11-20: 10 mL via INTRAVENOUS

## 2013-11-20 NOTE — Progress Notes (Addendum)
Paula [name] here today for simulation.  She has a new porta-cath in the upper left chest region.  Arrived with EMLA on site. 1st access was unsuccessful with no blood return as note by this RN.  2nd attempt successful by Joaquim Lai, RN.  Brisk blood return noted and flushed without any resistance.  Paula [name] does not have diabetes and her BUN 12.1 and Creat 1.0 collected on 11/19/13.   No prior contrast reaction.  Escorted to simulation at 3:05pm

## 2013-11-20 NOTE — Progress Notes (Signed)
De accessed left subclavian port cath following flush of saline and heparin per protocol. Needle intact upon removal. Applied a bandaid to old access site. Patient tolerated well.

## 2013-11-20 NOTE — Progress Notes (Signed)
  Radiation Oncology         [phone] ________________________________  Name: Paula [name] MRN: [id number]  Date: 11/20/2013  DOB: 1965/08/31  SIMULATION AND TREATMENT PLANNING NOTE and SPECIAL TREATMENT PROCEDURE  Outpatient  DIAGNOSIS:  151.2 (ICD-9) and C16.3 (ICD-10) Carcinoma of the antrum of the stomach  NARRATIVE:  The patient was brought to the [facility].  Identity was confirmed.  All relevant records and images related to the planned course of therapy were reviewed.  The patient freely provided informed written consent to proceed with treatment after reviewing the details related to the planned course of therapy. The consent form was witnessed and verified by the simulation staff.    Then, the patient was set-up in a stable reproducible  supine position for radiation therapy.  CT images were obtained.  Surface markings were placed.  The CT images were loaded into the planning software.    TREATMENT PLANNING NOTE: Treatment planning then occurred.  The radiation prescription was entered and confirmed.    A total of 5 medically necessary complex treatment devices were fabricated and supervised by me (4 fields with MLCs to block kidneys, liver, cord, heart, and a vaclock for her legs to immobilize). I have requested : 3D Simulation  I have requested a DVH of the following structures: kidneys, liver, cord, heart.   The patient will receive 45 Gy in 25 fractions to the gastric region and regional nodes.  -----------------------------------  Eppie Gibson, MD

## 2013-11-23 ENCOUNTER — Encounter (HOSPITAL_COMMUNITY): Payer: Self-pay | Admitting: Emergency Medicine

## 2013-11-23 ENCOUNTER — Emergency Department (HOSPITAL_COMMUNITY)
Admission: EM | Admit: 2013-11-23 | Discharge: 2013-11-24 | Disposition: A | Discharge status: Home / Self Care | Payer: No Typology Code available for payment source | Attending: Emergency Medicine | Admitting: Emergency Medicine

## 2013-11-23 DIAGNOSIS — K219 Gastro-esophageal reflux disease without esophagitis: Secondary | ICD-10-CM | POA: Insufficient documentation

## 2013-11-23 DIAGNOSIS — Z8742 Personal history of other diseases of the female genital tract: Secondary | ICD-10-CM | POA: Diagnosis not present

## 2013-11-23 DIAGNOSIS — Z85028 Personal history of other malignant neoplasm of stomach: Secondary | ICD-10-CM | POA: Insufficient documentation

## 2013-11-23 DIAGNOSIS — L259 Unspecified contact dermatitis, unspecified cause: Secondary | ICD-10-CM | POA: Insufficient documentation

## 2013-11-23 DIAGNOSIS — R011 Cardiac murmur, unspecified: Secondary | ICD-10-CM | POA: Diagnosis not present

## 2013-11-23 DIAGNOSIS — Z8739 Personal history of other diseases of the musculoskeletal system and connective tissue: Secondary | ICD-10-CM | POA: Insufficient documentation

## 2013-11-23 DIAGNOSIS — I10 Essential (primary) hypertension: Secondary | ICD-10-CM | POA: Insufficient documentation

## 2013-11-23 DIAGNOSIS — Z79899 Other long term (current) drug therapy: Secondary | ICD-10-CM | POA: Diagnosis not present

## 2013-11-23 DIAGNOSIS — R21 Rash and other nonspecific skin eruption: Secondary | ICD-10-CM | POA: Diagnosis present

## 2013-11-23 MED ORDER — HYDROMORPHONE HCL 1 MG/ML IJ SOLN
1.0000 mg | Freq: Once | INTRAMUSCULAR | Status: AC
  Administered 2013-11-24: 1 mg via INTRAVENOUS
  Filled 2013-11-23: qty 1
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
## 2013-11-23 NOTE — ED Provider Notes (Signed)
CSN: [id number]     Arrival date & time 11/23/13  2250 History   First MD Initiated Contact with Patient 11/23/13 2343     Chief Complaint  Patient presents with  . Burn  . Vascular Access Problem     (Consider location/radiation/quality/duration/timing/severity/associated sxs/prior Treatment) Patient is a 48 y.o. female presenting with rash. The history is provided by the patient.  Rash Location:  Torso Torso rash location:  L chest and R chest Quality: not draining, not painful, not red and not swelling   Severity:  Mild Onset quality:  Sudden Timing:  Constant Progression:  Unchanged Chronicity:  New (following port insertion and chemo and rads simulation) Context: not animal contact   Relieved by:  Nothing Worsened by:  Nothing tried Ineffective treatments:  None tried Associated symptoms: no abdominal pain, no fever, no shortness of breath, no throat swelling and no tongue swelling     Past Medical History  Diagnosis Date  . Headache(784.0) 2011    Migraines  . Esophageal reflux     On omeprazole  . Costochondritis   . Hypertension     started around 2011  . Ovarian cyst   . Stomach cancer   . Family history of cancer   . Heart murmur    Past Surgical History  Procedure Laterality Date  . Oophorectomy      Around 1985 left ovary removal  . Tonsillectomy      Around 1994  . Tubal ligation    . Esophagogastroduodenoscopy N/A 09/16/2013    Procedure: ESOPHAGOGASTRODUODENOSCOPY (EGD);  Surgeon: Wonda Horner, MD;  Location: [facility];  Service: Endoscopy;  Laterality: N/A;  . Laparoscopy N/A 09/20/2013    Procedure: DIAGNOSTIC LAPAROSCOPY,DISTAL GASTRECTOMY AND FEEDING GASTROJEJUNOSTOMY;  Surgeon: [name], MD;  Location: [facility];  Service: General;  Laterality: N/A;  . Portacath placement Left 11/18/2013    Procedure: INSERTION PORT-A-CATH;  Surgeon: [name], MD;  Location: [facility];  Service: General;  Laterality: Left;   Family History  Problem  Relation Age of Onset  . Hypertension Mother   . Diabetes Mellitus II Mother   . Hypertension Sister   . Hypertension Brother   . Heart failure Maternal Grandmother   . Hypertension Maternal Grandfather   . Prostate cancer Paternal Uncle     living at 56  . Gastric cancer Cousin 21    pat first cousin; daughter of pat uncle living with prostate ca   . Gastric cancer Cousin     mat first cousin; daughter of mat aunt who died in 52s due to heart issues  . Prostate cancer Paternal Uncle     deceased  . Prostate cancer Paternal Uncle     deceased  . Lung cancer Paternal Uncle     smoker; deceased   History  Substance Use Topics  . Smoking status: Former Research scientist (life sciences)  . Smokeless tobacco: Never Used     Comment: Stopped 2004  . Alcohol Use: No   OB History   Grav Para Term Preterm Abortions TAB SAB Ect Mult Living                 Review of Systems  Constitutional: Negative for fever.  Respiratory: Negative for shortness of breath.   Gastrointestinal: Negative for abdominal pain.  Skin: Positive for rash.  All other systems reviewed and are negative.     Allergies  Review of patient's allergies indicates no known allergies.  Home Medications   Prior to Admission medications  Medication Sig Start Date End Date Taking? Authorizing Provider  carvedilol (COREG) 6.25 MG tablet Take 6.25 mg by mouth 2 (two) times daily with a meal.   Yes Historical Provider, MD  fentaNYL (DURAGESIC - DOSED MCG/HR) 25 MCG/HR patch Place 1 patch (25 mcg total) onto the skin every 3 (three) days. 11/12/13  Yes Curt Bears, MD  HYDROmorphone HCl (DILAUDID) 1 MG/ML LIQD Place 1 mL (1 mg total) into feeding tube every 2 (two) hours as needed for severe pain. 11/18/13  Yes [name], MD  lidocaine (LIDODERM) 5 % Place 1 patch onto the skin daily as needed. pain 10/17/13  Yes Historical Provider, MD  LORazepam (ATIVAN) 1 MG tablet Take 1 mg by mouth 3 (three) times daily.   Yes Historical Provider, MD   metoCLOPramide (REGLAN) 10 MG tablet Take 1 tablet (10 mg total) by mouth 4 (four) times daily. 11/05/13  Yes Venetia Maxon Rama, MD  Multiple Vitamins-Minerals (MULTIVITAMIN & MINERAL PO) Take 1 tablet by mouth daily.   Yes Historical Provider, MD  Nutritional Supplements (ENSURE COMPLETE PO) Take 237 mLs by mouth daily.   Yes Historical Provider, MD  OLANZapine zydis (ZYPREXA) 5 MG disintegrating tablet Take 1 tablet (5 mg total) by mouth at bedtime. 11/05/13  Yes Venetia Maxon Rama, MD  ondansetron (ZOFRAN-ODT) 8 MG disintegrating tablet Take 1 tablet (8 mg total) by mouth every 8 (eight) hours as needed for nausea or vomiting. 11/19/13  Yes Curt Bears, MD  pantoprazole (PROTONIX) 40 MG tablet Take 1 tablet (40 mg total) by mouth 2 (two) times daily before a meal. 11/05/13  Yes Venetia Maxon Rama, MD  lidocaine-prilocaine (EMLA) cream Apply 1 application topically as needed. Apply to port 1 hour before chemo 11/13/13   Curt Bears, MD   BP 124/85  Pulse 108  Temp(Src) 97.3 F (36.3 C) (Oral)  Resp 16  Ht 5\' 2"  (1.575 m)  Wt 130 lb (58.968 kg)  BMI 23.77 kg/m2  SpO2 98%  LMP 08/21/2013 Physical Exam  Constitutional: She is oriented to person, place, and time. She appears well-developed and well-nourished. No distress.  HENT:  Head: Normocephalic and atraumatic.  Mouth/Throat: Oropharynx is clear and moist.  Eyes: EOM are normal. Pupils are equal, round, and reactive to light.  Neck: Normal range of motion. Neck supple.  Cardiovascular: Normal rate and regular rhythm.   Pulmonary/Chest: Effort normal and breath sounds normal. She has no wheezes. She has no rales.  Abdominal: Soft. Bowel sounds are normal. There is no tenderness. There is no rebound and no guarding.  Musculoskeletal: Normal range of motion.  Neurological: She is alert and oriented to person, place, and time.  Skin: Skin is warm and dry.  Brownish lines on the RUE that appear to be in the configuration of tape and then  a lesion across upper chest with similar appears.  Port insertion is clean and dry and intact  Psychiatric: She has a normal mood and affect.    ED Course  Procedures (including critical care time) Labs Review Labs Reviewed  CBC WITH DIFFERENTIAL  I-STAT CHEM 8, ED    Imaging Review No results found.   EKG Interpretation None      MDM   Final diagnoses:  None    320 Case d/w Dr. Benay Spice he too thought it was tape dermatitis.    Do not use tape.  Incision for port is C/D/I.  Follow up with Dr. Barry Dienes     Roselind Klus Alfonso Patten, MD 11/24/13  0338 

## 2013-11-24 ENCOUNTER — Emergency Department (HOSPITAL_COMMUNITY): Payer: No Typology Code available for payment source

## 2013-11-24 ENCOUNTER — Encounter (HOSPITAL_COMMUNITY): Payer: Self-pay | Admitting: Emergency Medicine

## 2013-11-24 LAB — CBC WITH DIFFERENTIAL/PLATELET
BASOS PCT: 0 % (ref 0–1)
Basophils Absolute: 0 10*3/uL (ref 0.0–0.1)
Eosinophils Absolute: 0.1 10*3/uL (ref 0.0–0.7)
Eosinophils Relative: 2 % (ref 0–5)
HEMATOCRIT: 29.9 % — AB (ref 36.0–46.0)
Hemoglobin: 9.5 g/dL — ABNORMAL LOW (ref 12.0–15.0)
Lymphocytes Relative: 44 % (ref 12–46)
Lymphs Abs: 2.8 10*3/uL (ref 0.7–4.0)
MCH: 24.9 pg — ABNORMAL LOW (ref 26.0–34.0)
MCHC: 31.8 g/dL (ref 30.0–36.0)
MCV: 78.5 fL (ref 78.0–100.0)
MONO ABS: 0.1 10*3/uL (ref 0.1–1.0)
Monocytes Relative: 2 % — ABNORMAL LOW (ref 3–12)
Neutro Abs: 3.3 10*3/uL (ref 1.7–7.7)
Neutrophils Relative %: 52 % (ref 43–77)
Platelets: 246 10*3/uL (ref 150–400)
RBC: 3.81 MIL/uL — ABNORMAL LOW (ref 3.87–5.11)
RDW: 16.1 % — AB (ref 11.5–15.5)
WBC: 6.4 10*3/uL (ref 4.0–10.5)

## 2013-11-24 LAB — I-STAT CHEM 8, ED
BUN: 7 mg/dL (ref 6–23)
CALCIUM ION: 1.15 mmol/L (ref 1.12–1.23)
Chloride: 100 mEq/L (ref 96–112)
Creatinine, Ser: 0.8 mg/dL (ref 0.50–1.10)
Glucose, Bld: 104 mg/dL — ABNORMAL HIGH (ref 70–99)
HEMATOCRIT: 33 % — AB (ref 36.0–46.0)
Hemoglobin: 11.2 g/dL — ABNORMAL LOW (ref 12.0–15.0)
Potassium: 4.4 mEq/L (ref 3.7–5.3)
SODIUM: 137 meq/L (ref 137–147)
TCO2: 28 mmol/L (ref 0–100)

## 2013-11-24 NOTE — ED Notes (Signed)
Pt refused to sign discharge signature

## 2013-11-24 NOTE — ED Notes (Signed)
Dr Randal Buba at bedside speaking with pt

## 2013-11-24 NOTE — ED Notes (Signed)
Attempted to discharge pt but she wanted to speak with dr. Delilah Shan writer took over assignment at 330.

## 2013-11-25 ENCOUNTER — Other Ambulatory Visit: Payer: Self-pay | Admitting: *Deleted

## 2013-11-26 ENCOUNTER — Encounter: Payer: Self-pay | Admitting: Physician Assistant

## 2013-11-26 ENCOUNTER — Ambulatory Visit (HOSPITAL_BASED_OUTPATIENT_CLINIC_OR_DEPARTMENT_OTHER): Payer: No Typology Code available for payment source | Admitting: Physician Assistant

## 2013-11-26 ENCOUNTER — Ambulatory Visit: Payer: No Typology Code available for payment source | Admitting: Nutrition

## 2013-11-26 ENCOUNTER — Telehealth: Payer: Self-pay | Admitting: *Deleted

## 2013-11-26 ENCOUNTER — Telehealth: Payer: Self-pay | Admitting: Internal Medicine

## 2013-11-26 ENCOUNTER — Ambulatory Visit (HOSPITAL_BASED_OUTPATIENT_CLINIC_OR_DEPARTMENT_OTHER): Payer: No Typology Code available for payment source

## 2013-11-26 ENCOUNTER — Other Ambulatory Visit (HOSPITAL_BASED_OUTPATIENT_CLINIC_OR_DEPARTMENT_OTHER): Payer: No Typology Code available for payment source

## 2013-11-26 ENCOUNTER — Other Ambulatory Visit: Payer: Self-pay | Admitting: Radiation Oncology

## 2013-11-26 VITALS — BP 124/85 | HR 117 | Temp 98.6°F | Resp 19 | Ht 62.0 in | Wt 127.8 lb

## 2013-11-26 DIAGNOSIS — Z5111 Encounter for antineoplastic chemotherapy: Secondary | ICD-10-CM

## 2013-11-26 DIAGNOSIS — C169 Malignant neoplasm of stomach, unspecified: Secondary | ICD-10-CM

## 2013-11-26 DIAGNOSIS — C779 Secondary and unspecified malignant neoplasm of lymph node, unspecified: Secondary | ICD-10-CM

## 2013-11-26 DIAGNOSIS — R110 Nausea: Secondary | ICD-10-CM

## 2013-11-26 LAB — CBC WITH DIFFERENTIAL/PLATELET
BASO%: 0.5 % (ref 0.0–2.0)
Basophils Absolute: 0 10*3/uL (ref 0.0–0.1)
EOS ABS: 0.1 10*3/uL (ref 0.0–0.5)
EOS%: 2.8 % (ref 0.0–7.0)
HCT: 30.3 % — ABNORMAL LOW (ref 34.8–46.6)
HGB: 9.3 g/dL — ABNORMAL LOW (ref 11.6–15.9)
LYMPH%: 36.6 % (ref 14.0–49.7)
MCH: 24.1 pg — ABNORMAL LOW (ref 25.1–34.0)
MCHC: 30.5 g/dL — ABNORMAL LOW (ref 31.5–36.0)
MCV: 79 fL — ABNORMAL LOW (ref 79.5–101.0)
MONO#: 0.2 10*3/uL (ref 0.1–0.9)
MONO%: 3.7 % (ref 0.0–14.0)
NEUT%: 56.4 % (ref 38.4–76.8)
NEUTROS ABS: 2.9 10*3/uL (ref 1.5–6.5)
Platelets: 242 10*3/uL (ref 145–400)
RBC: 3.84 10*6/uL (ref 3.70–5.45)
RDW: 16.7 % — ABNORMAL HIGH (ref 11.2–14.5)
WBC: 5.2 10*3/uL (ref 3.9–10.3)
lymph#: 1.9 10*3/uL (ref 0.9–3.3)

## 2013-11-26 LAB — COMPREHENSIVE METABOLIC PANEL (CC13)
ALT: 16 U/L (ref 0–55)
ANION GAP: 7 meq/L (ref 3–11)
AST: 13 U/L (ref 5–34)
Albumin: 3.6 g/dL (ref 3.5–5.0)
Alkaline Phosphatase: 76 U/L (ref 40–150)
BUN: 7.2 mg/dL (ref 7.0–26.0)
CALCIUM: 9.4 mg/dL (ref 8.4–10.4)
CHLORIDE: 105 meq/L (ref 98–109)
CO2: 29 meq/L (ref 22–29)
Creatinine: 0.9 mg/dL (ref 0.6–1.1)
GLUCOSE: 125 mg/dL (ref 70–140)
Potassium: 3.7 mEq/L (ref 3.5–5.1)
SODIUM: 140 meq/L (ref 136–145)
TOTAL PROTEIN: 7 g/dL (ref 6.4–8.3)
Total Bilirubin: 0.53 mg/dL (ref 0.20–1.20)

## 2013-11-26 MED ORDER — FLUOROURACIL CHEMO INJECTION 2.5 GM/50ML
600.0000 mg/m2 | Freq: Once | INTRAVENOUS | Status: AC
  Administered 2013-11-26: 950 mg via INTRAVENOUS
  Filled 2013-11-26: qty 19

## 2013-11-26 MED ORDER — SODIUM CHLORIDE 0.9 % IV SOLN
Freq: Once | INTRAVENOUS | Status: AC
  Administered 2013-11-26: 12:00:00 via INTRAVENOUS

## 2013-11-26 MED ORDER — PROCHLORPERAZINE MALEATE 10 MG PO TABS
ORAL_TABLET | ORAL | Status: AC
  Filled 2013-11-26: qty 1

## 2013-11-26 MED ORDER — HEPARIN SOD (PORK) LOCK FLUSH 100 UNIT/ML IV SOLN
500.0000 [IU] | Freq: Once | INTRAVENOUS | Status: AC | PRN
  Administered 2013-11-26: 500 [IU]
  Filled 2013-11-26: qty 5

## 2013-11-26 MED ORDER — SODIUM CHLORIDE 0.9 % IJ SOLN
10.0000 mL | INTRAMUSCULAR | Status: DC | PRN
  Administered 2013-11-26: 10 mL
  Filled 2013-11-26: qty 10

## 2013-11-26 MED ORDER — PROCHLORPERAZINE MALEATE 10 MG PO TABS
10.0000 mg | ORAL_TABLET | Freq: Once | ORAL | Status: AC
  Administered 2013-11-26: 10 mg via ORAL

## 2013-11-26 MED ORDER — LEUCOVORIN CALCIUM INJECTION 350 MG
500.0000 mg/m2 | Freq: Once | INTRAVENOUS | Status: AC
  Administered 2013-11-26: 810 mg via INTRAVENOUS
  Filled 2013-11-26: qty 40.5

## 2013-11-26 NOTE — Telephone Encounter (Signed)
CALLED PATIENT TO INFORM OF FU ON 01-31-14 @ 10:40 AM, NO ANSWER MAILED APPT. CARD

## 2013-11-26 NOTE — Telephone Encounter (Signed)
gv and printed appt sched and avs for pt for OCT. °

## 2013-11-26 NOTE — Progress Notes (Addendum)
[facility] [phone]   [phone]  SHARED VISIT PROGRESS NOTE  Arnoldo Morale, MD [address] [place] [address]  DIAGNOSIS: Stage IIA (T1a, N2, M0) gastric adenocarcinoma diagnosed in July of 2015  PRIOR [place] post diagnostic laparoscopy, distal gastrectomy with billroth II anastamosis and feeding gastrojejunostomy tube under the care of Dr. Barry Dienes on 09/20/2013.   CURRENT THERAPY: adjuvant systemic chemotherapy with 5-FU 600 mg/M2 with leucovorin on days 1, 8, 15, 22, 29 and 36 every 8 weeks. First dose 11/19/2013.  INTERVAL HISTORY: Paula [name] 48 y.o. female returns to the clinic today for a symptom management visit. She began adjuvant systemic chemotherapy with 5-FU and leucovorin status post 1 week of therapy. The patient was diagnosed with gastric adenocarcinoma in July of 2015 and it was felt initially to be early stage disease. She underwent surgical resection with distal gastrectomy with Billroth II anastomosis and feeding gastrojejunostomy tube under the care of Dr. Barry Dienes. 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 There was evidence for metastatic carcinoma in 3 of 5 lymph nodes in addition to metastatic carcinoma in one of 6 perigastric lymph nodes. Her postoperative course was complicated with persistent and intractable nausea and vomiting.  Her current nausea medications include Ativan, Phenergan, Zofran as well as Zyprexa. She continues to have problems with nausea and reports that she gets full quickly. When she gets full, she feels sick and will drain her feeding tube. She is currently improving her by mouth intake only able to take her Dilaudid via her feeding tube she reports to significant episodes of vomiting, one occurring Sunday morning around 6 AM and today she was coming to  her office visit. She feels cold but has had no frank fever or chills. She denies diarrhea or constipation. Currently she is having a bowel movement every other day. The patient is feeling a little bit better today and prevents to proceed with day 8 cycle #1. Globally her nausea and vomiting are better controlled.  She denied having any significant chest pain, shortness breath, cough or hemoptysis.   MEDICAL HISTORY: Past Medical History  Diagnosis Date  . Headache(784.0) 2011    Migraines  . Esophageal reflux     On omeprazole  . Costochondritis   . Hypertension     started around 2011  . Ovarian cyst   . Stomach cancer   . Family history of cancer   . Heart murmur     ALLERGIES:  has No Known Allergies.  MEDICATIONS:  Current Outpatient Prescriptions  Medication Sig Dispense Refill  . carvedilol (COREG) 6.25 MG tablet Take 6.25 mg by mouth 2 (two) times daily with a meal.      . fentaNYL (DURAGESIC - DOSED MCG/HR) 25 MCG/HR patch Place 1 patch (25 mcg total) onto the skin every 3 (three) days.  5 patch  0  . HYDROmorphone HCl (DILAUDID) 1 MG/ML LIQD Place 1 mL (1 mg total) into feeding tube every 2 (two) hours as needed for severe pain.  100 mL  0  . lidocaine (LIDODERM) 5 % Place 1 patch onto the skin daily as needed. pain      . lidocaine-prilocaine (EMLA) cream Apply 1 application topically as needed. Apply to port 1 hour before chemo  30 g  0  . LORazepam (ATIVAN) 1 MG tablet Take 1 mg by mouth 3 (  three) times daily.      . metoCLOPramide (REGLAN) 10 MG tablet Take 1 tablet (10 mg total) by mouth 4 (four) times daily.  120 tablet  3  . Multiple Vitamins-Minerals (MULTIVITAMIN & MINERAL PO) Take 1 tablet by mouth daily.      . Nutritional Supplements (ENSURE COMPLETE PO) Take 237 mLs by mouth daily.      Marland Kitchen OLANZapine zydis (ZYPREXA) 5 MG disintegrating tablet Take 1 tablet (5 mg total) by mouth at bedtime.  30 tablet  3  . ondansetron (ZOFRAN-ODT) 8 MG disintegrating tablet  Take 1 tablet (8 mg total) by mouth every 8 (eight) hours as needed for nausea or vomiting.  20 tablet  0  . pantoprazole (PROTONIX) 40 MG tablet Take 1 tablet (40 mg total) by mouth 2 (two) times daily before a meal.  60 tablet  3   No current facility-administered medications for this visit.   Facility-Administered Medications Ordered in Other Visits  Medication Dose Route Frequency Provider Last Rate Last Dose  . sodium chloride 0.9 % injection 10 mL  10 mL Intracatheter PRN Curt Bears, MD   10 mL at 11/26/13 1601    SURGICAL HISTORY:  Past Surgical History  Procedure Laterality Date  . Oophorectomy      Around 1985 left ovary removal  . Tonsillectomy      Around 1994  . Tubal ligation    . Esophagogastroduodenoscopy N/A 09/16/2013    Procedure: ESOPHAGOGASTRODUODENOSCOPY (EGD);  Surgeon: Wonda Horner, MD;  Location: [facility];  Service: Endoscopy;  Laterality: N/A;  . Laparoscopy N/A 09/20/2013    Procedure: DIAGNOSTIC LAPAROSCOPY,DISTAL GASTRECTOMY AND FEEDING GASTROJEJUNOSTOMY;  Surgeon: [name], MD;  Location: [facility];  Service: General;  Laterality: N/A;  . Portacath placement Left 11/18/2013    Procedure: INSERTION PORT-A-CATH;  Surgeon: [name], MD;  Location: [facility];  Service: General;  Laterality: Left;    REVIEW OF SYSTEMS:  Constitutional: positive for fatigue Eyes: negative Ears, nose, mouth, throat, and face: negative Respiratory: negative Cardiovascular: negative Gastrointestinal: positive for nausea Genitourinary:negative Integument/breast: negative Hematologic/lymphatic: negative Musculoskeletal:negative Neurological: negative Behavioral/Psych: negative Endocrine: negative Allergic/Immunologic: negative   PHYSICAL EXAMINATION: General appearance: alert, cooperative, fatigued and no distress Head: Normocephalic, without obvious abnormality, atraumatic Neck: no adenopathy, no JVD, supple, symmetrical, trachea midline and thyroid not enlarged,  symmetric, no tenderness/mass/nodules Lymph nodes: Cervical, supraclavicular, and axillary nodes normal. Resp: clear to auscultation bilaterally Back: symmetric, no curvature. ROM normal. No CVA tenderness. Cardio: regular rate and rhythm, S1, S2 normal, no murmur, click, rub or gallop GI: soft, non-tender; bowel sounds normal; no masses,  no organomegaly and Jejunostomy feeding tube in place, no evidence of infection Extremities: extremities normal, atraumatic, no cyanosis or edema Neurologic: Alert and oriented X 3, normal strength and tone. Normal symmetric reflexes. Normal coordination and gait  ECOG PERFORMANCE STATUS: 1 - Symptomatic but completely ambulatory  Blood pressure 124/85, pulse 117, temperature 98.6 F (37 C), temperature source Oral, resp. rate 19, height _0  (1.575 m), weight 127 lb 12.8 oz (57.97 kg), last menstrual period 08/21/2013, SpO2 100.00%.  LABORATORY DATA: Lab Results  Component Value Date   WBC 5.2 11/26/2013   HGB 9.3* 11/26/2013   HCT 30.3* 11/26/2013   MCV 79.0* 11/26/2013   PLT 242 11/26/2013      Chemistry      Component Value Date/Time   NA 140 11/26/2013 0941   NA 137 11/24/2013 0104   K 3.7 11/26/2013 0941   K 4.4  11/24/2013 0104   CL 100 11/24/2013 0104   CO2 29 11/26/2013 0941   CO2 27 11/18/2013 0525   BUN 7.2 11/26/2013 0941   BUN 7 11/24/2013 0104   CREATININE 0.9 11/26/2013 0941   CREATININE 0.80 11/24/2013 0104      Component Value Date/Time   CALCIUM 9.4 11/26/2013 0941   CALCIUM 9.4 11/18/2013 0525   ALKPHOS 76 11/26/2013 0941   ALKPHOS 109 11/11/2013 1415   AST 13 11/26/2013 0941   AST 27 11/11/2013 1415   ALT 16 11/26/2013 0941   ALT 75* 11/11/2013 1415   BILITOT 0.53 11/26/2013 0941   BILITOT 0.4 11/11/2013 1415       RADIOGRAPHIC STUDIES: Dg Abd 1 View  10/11/2013   CLINICAL DATA:  Intolerance of jejunostomy tube feedings.  EXAM: ABDOMEN - 1 VIEW  COMPARISON:  Abdomen series 8/19 2015.  FINDINGS: Surgical sutures left upper quadrant.  Small amount of residual contrast in the colon from prior studies. Jejunostomy tube was injected with nonionic contrast. Jejunostomy tube is widely patent. Proximal small bowel is nondilated.  IMPRESSION: 1. Patent jejunostomy tube.  Proximal small bowel nondilated. 2. No acute abnormality.   Electronically Signed   By: [place]   On: 10/11/2013 12:11   Ct Head W Wo Contrast  10/24/2013   CLINICAL DATA:  Stomach cancer.  Intractable nausea and vomiting.  EXAM: CT HEAD WITHOUT AND WITH CONTRAST  TECHNIQUE: Contiguous axial images were obtained from the base of the skull through the vertex without and with intravenous contrast  CONTRAST:  158m OMNIPAQUE IOHEXOL 300 MG/ML  SOLN  COMPARISON:  MRI brain without and with contrast 09/19/2013.  FINDINGS: Previously noted left extra-axial hemorrhages near completely resolved. There is no evidence for new hemorrhage or significant mass effect. No acute cortical infarct or parenchymal hemorrhage, or mass lesion is present. The ventricles are of normal size.  The postcontrast images demonstrate no pathologic enhancement.  Paranasal sinuses and mastoid air cells are clear.  IMPRESSION: 1. Near complete resolution of left subdural hematoma without evidence for new hemorrhage. 2. No pathologic enhancement to suggest metastatic disease of the brain or meninges. Previously seen dural enhancement associated with the left-sided hemorrhage is beyond the resolution of this exam. 3. No acute intracranial abnormality.   Electronically Signed   By: CLawrence SantiagoM.D.   On: 10/24/2013 12:58   Mr Lumbar Spine W Wo Contrast  10/11/2013   CLINICAL DATA:  Gastric carcinoma.  Abnormal bone scan at L1.  EXAM: MRI LUMBAR SPINE WITHOUT AND WITH CONTRAST  TECHNIQUE: Multiplanar and multiecho pulse sequences of the lumbar spine were obtained without and with intravenous contrast.  CONTRAST:  15 mL MultiHance IV  COMPARISON:  Bone scan 10/11/2013  FINDINGS: Transitional anatomy.  L5 is  partially incorporated into the sacrum.  Negative for fracture or mass lesion. Vertebral body signal is normal with particular attention L1. No evidence of bone marrow edema or mass. No enhancing lesions are seen postcontrast infusion. Etiology of the uptake on the bone scan is unclear.  Negative for fracture. Conus medullaris is normal. No significant degenerative change in the lumbar spine. No disc protrusion or spinal stenosis.  IMPRESSION: Negative for fracture or metastatic disease. Bone marrow signal is normal. No explanation for the bone scan finding at L1.   Electronically Signed   By: CFranchot GalloM.D.   On: 10/11/2013 19:56   Nm Bone Scan Whole Body  10/11/2013   CLINICAL DATA:  Back pain.  Signet cell gastric carcinoma.  EXAM: NUCLEAR MEDICINE WHOLE BODY BONE SCAN  TECHNIQUE: Whole body anterior and posterior images were obtained approximately 3 hours after intravenous injection of radiopharmaceutical.  RADIOPHARMACEUTICALS:  24.5 mCi Technetium-99 MDP  COMPARISON:  CT chest, abdomen, and pelvis 09/13/2013  FINDINGS: There is a small focus of increased radiotracer uptake in the spine slightly left of midline at approximately L1, best seen on the anterior images. Uptake throughout the remainder of the axial skeleton is symmetric. Uptake involving the left great toe is likely degenerative. Normal uptake is seen in the kidneys with excretion in the bladder.  IMPRESSION: Focal uptake in the spine at approximately L1, with considerations including degenerative change as well as metastasis. Further evaluation with MRI may be helpful.   Electronically Signed   By: Logan Bores   On: 10/11/2013 11:42   Ct Abdomen Pelvis W Contrast  11/01/2013   CLINICAL DATA:  Nausea and vomiting. Prior gastric dissection for gastric cancer.  EXAM: CT ABDOMEN AND PELVIS WITH CONTRAST  TECHNIQUE: Multidetector CT imaging of the abdomen and pelvis was performed using the standard protocol following bolus administration of  intravenous contrast.  CONTRAST:  1 OMNIPAQUE IOHEXOL 300 MG/ML SOLN, 13m OMNIPAQUE IOHEXOL 300 MG/ML SOLN  COMPARISON:  Multiple exams, including 09/13/2013  FINDINGS: Lower chest: Stable interstitial thickening, mild volume loss, and potentially bronchiectasis in the lateral basal segment right lower lobe.  Hepatobiliary: 1.1 x 1.0 cm hypodense lesion likely with internal complexity or septation, segment 2 of the liver, image 11 series 2. Contracted gallbladder.  Spleen: Intact  Pancreas: Intact  Stomach/Bowel: Gastrostomy tube noted. There appears to be a GJ extension. The stomach is partially resected. The gastric section is new compared to 7/20 03/2013. Stranding in the surrounding omentum and subxiphoid region from prior laparotomy. I do not observe an abscess around the operative site. No locally recurrent malignancy is readily apparent. Appendix normal. Orally administered contrast extends through to the rectum.  Adrenals/urinary tract: Stable 5 mm hypodense lesion medially in the right kidney upper pole, likely a cyst but technically too small to characterize. Otherwise negative.  Vascular/Lymphatic: Unremarkable  Reproductive: Uterine fibroids.  Ovaries unremarkable.  Muskuloskeletal: Transitional lumbosacral vertebra probably represents L5.  Other: None  IMPRESSION: 1. Postoperative findings from recent partial gastrectomy. 2. 1.0 x 1.1 cm hypodense lesion in segment 2 of the liver. This lesion is not entirely simple in appears to have internal septation. An arterial phase enhancing 9 x 8 mm lesion is seen in this location on the CT chest from 09/13/2013, but there is no visible lesion differentiable from the hepatic parenchyma on the portal venous and delayed phase images of the CT abdomen from 09/11/2013. Differential diagnostic considerations include hypervascular metastatic lesion, focal nodular hyperplasia, and a small hepatic adenoma. Imaging workup options include hepatic protocol MRI and nuclear  medicine PET-CT. 3. Ancillary findings include stable abnormal interstitial thickening in the right lower lobe and uterine fibroids.   Electronically Signed   By: WSherryl BartersM.D.   On: 11/01/2013 17:42   Ir Replc Gastro/colonic Tube Percut W/fluoro  10/29/2013   CLINICAL DATA:  Occluded gastrojejunostomy tube  EXAM: GASTROSTOMY CATHETER REPLACEMENT  FLUOROSCOPY TIME:  42 seconds.  MEDICATIONS AND MEDICAL HISTORY: None  ANESTHESIA/SEDATION: Moderate sedation time: 9 minutes  CONTRAST:  10 cc Omnipaque 300  PROCEDURE: The existing gastrojejunostomy tube was exchanged over a stiff glidewire for a new 18 French gastrojejunostomy tube. The balloon was insufflated with 10 cc saline. Contrast was  injected.  FINDINGS: The jejunostomy tube portion traverses through a Billroth anastomosis and the tip is in the jejunum.  COMPLICATIONS: None  IMPRESSION: Successful gastrojejunostomy tube exchange.   Electronically Signed   By: Maryclare Bean M.D.   On: 10/29/2013 08:41   Ir Mech Remov Obstruc Mat Any Colon Tube W/fluoro  11/04/2013   INDICATION: Clogged gastrojejunostomy tube  EXAM: Bedside unclogging of gastrojejunostomy tube.  MEDICATIONS: None  ANESTHESIA/SEDATION: None  CONTRAST:  None  COMPLICATIONS: None immediate.  PROCEDURE: Informed consent was obtained from the patient following an explanation of the procedure, risks, benefits and alternatives. The patient understands, agrees and consents for the procedure. All questions were addressed. A time out was performed prior to the initiation of the procedure.  And intervention radiology team was brought to the patient's room at the bedside. A 3 cc syringe was attached to the hub of the gastrojejunostomy tube and infusion of saline met no resistance during the infusion. Subsequently a 10 cc syringe was attached to the hub of the gastrojejunostomy tube with no resistance upon infusion of saline.  There was slight resistance with infusion of saline with a 30 cc catheter  tip syringe.  The catheter was removed and a stiff Amplatz wire was used to cannulate the gastrojejunostomy tube. A pipe cleaner technique was used to clear the tube within a material. Again no resistance was met with 10 cc infusion through a 10 cc syringe. Mild resistance was encountered with infusion of saline through the 30 cc catheter tip syringe, however, the catheter was flushing.  The patient tolerated the procedure well. No complications. No blood loss.  IMPRESSION: Bedside on clogging of gastrojejunostomy tube. The catheter flushes well with a 10 cc syringe, though there is some resistance with a large (30 cc catheter tip) syringe. At this time the gastrojejunostomy tube is operable, and the team will attempt feeding through the current tube.  PLAN: The care team will attempt to use the indwelling tube for subsequent feeds. If there are other troubles encounter with the gastrojejunostomy tube the team will Re contact interventional radiology for further evaluation and potential exchange of the indwelling tube.  Signed,  Dulcy Fanny. Earleen Newport, DO  [facility]  [facility]   Electronically Signed   By: Corrie Mckusick O.D.   On: 11/04/2013 15:46   Dg Abd Acute W/chest  10/21/2013   CLINICAL DATA:  Gastric carcinoma. Abdominal pain. Vomiting. Cough.  EXAM: ACUTE ABDOMEN SERIES (ABDOMEN 2 VIEW & CHEST 1 VIEW)  COMPARISON:  None.  FINDINGS: Percutaneous gastrostomy tube again seen in left upper quadrant. Surgical clips and staples noted. No evidence of dilated bowel loops or free air.  Mild elevation of right hemidiaphragm and right basilar pleural- parenchymal scarring appears stable. No evidence of acute infiltrate or edema. No evidence of pleural effusion. Heart size is within normal limits. No mass or lymphadenopathy identified.  IMPRESSION: Unremarkable bowel gas pattern.  No acute findings.  Stable right basilar pleural- parenchymal scarring. No active lung  disease.   Electronically Signed   By: Earle Gell M.D.   On: 10/21/2013 14:22   Dg Abd Acute W/chest  10/20/2013   CLINICAL DATA:  Worsening abdominal pain after recent discharge. Recent diagnosis of gastric cancer.  EXAM: ACUTE ABDOMEN SERIES (ABDOMEN 2 VIEW & CHEST 1 VIEW)  COMPARISON:  Acute abdominal series 10/19/2013  FINDINGS: Similar appearance of right basilar scarring. Otherwise, the lungs are clear. Stable cardiac and mediastinal contours. No free air.  Percutaneous gastrostomy tube appears in similar position and projects over the stomach. Staple lines are noted in the left upper quadrant suggesting partial gastric resection. No evidence of ascites or bowel obstruction. No acute osseous abnormality.  IMPRESSION: 1. No acute cardiopulmonary process. 2. Stable right basilar atelectasis versus scarring. 3. Stable position of gastrostomy tube. 4. Nonobstructed bowel gas pattern. 5. No evidence of free air or ascites.   Electronically Signed   By: Jacqulynn Cadet M.D.   On: 10/20/2013 14:54   Dg Abd Acute W/chest  10/19/2013   CLINICAL DATA:  48 year old female with abdominal pain and nausea. Recent diagnosis of gastric cancer and surgery.  EXAM: ACUTE ABDOMEN SERIES (ABDOMEN 2 VIEW & CHEST 1 VIEW)  COMPARISON:  10/13/2013 and prior radiographs  FINDINGS: The cardiomediastinal silhouette is unremarkable.  Mild right basilar atelectasis/scarring again identified.  No airspace disease, pleural effusion or pneumothorax.  Surgical sutures overlying the left abdomen noted.  Jejunostomy catheter is again noted.  The bowel gas pattern is unremarkable without evidence of bowel obstruction or pneumoperitoneum.  No suspicious calcifications are identified.  The bony structures are unremarkable.  IMPRESSION: No evidence of acute abnormality.  Normal bowel gas pattern.   Electronically Signed   By: Hassan Rowan M.D.   On: 10/19/2013 19:26   Dg Abd Acute W/chest  10/13/2013   CLINICAL DATA:  Abdominal pain and  vomiting. Discharged today from a 30 day hospital stay.  EXAM: ACUTE ABDOMEN SERIES (ABDOMEN 2 VIEW & CHEST 1 VIEW)  COMPARISON:  10/11/2013  FINDINGS: Right PICC catheter with tip over the cavoatrial junction. Slightly shallow inspiration. Fibrosis or atelectasis in the right lung base. No focal consolidation. No blunting of costophrenic angles. Normal heart size and pulmonary vascularity.  Postoperative changes in the left upper quadrant with gastrostomy tube present. Residual contrast material in the colon without distention. No small bowel distention. No free intra-abdominal air. No abnormal air-fluid levels. No radiopaque stones. Visualized bones appear intact.  IMPRESSION: Fibrosis or atelectasis in the right lung base. Nonobstructive bowel gas pattern. No free air. Residual contrast material in the colon.   Electronically Signed   By: Lucienne Capers M.D.   On: 10/13/2013 21:15   Dg Abd Portable 2v  10/22/2013   CLINICAL DATA:  Status post partial gastrectomy for gastric cancer with Billroth II and gastrojejunostomy  EXAM: PORTABLE ABDOMEN - 2 VIEW  COMPARISON:  Abdominal radiographs dated 10/21/2013. CT abdomen pelvis dated 09/13/2013.  FINDINGS: Patient is status post distal gastrectomy with gastrojejunostomy.  Initially, 25 mL contrast was administered via the J-tube. Contrast opacifies the proximal jejunum as well as the residual stomach.  Subsequently, 25 mL contrast was administered via the G-tube lumen. Additional contrast opacifies the residual stomach, confirming intragastric placement.  The distal tip of the J-tube is outlined on the second radiograph and likely resides in the proximal aspect of the jejunum just distal to the gastrojejunostomy.  IMPRESSION: Status post distal gastrectomy with gastrojejunostomy.  Intragastric placement of the gastrostomy tube is confirmed.  Jejunostomy tube likely resides in the proximal jejunum just distal to the gastrojejunostomy.   Electronically Signed   By:  Julian Hy M.D.   On: 10/22/2013 16:08   Dg Ugi W/water Sol Cm  10/23/2013   CLINICAL DATA:  History of gastric cancer, status post gastrojejunostomy.  EXAM: WATER SOLUBLE UPPER GI SERIES  TECHNIQUE: Single-column upper GI series was performed using water soluble contrast.  CONTRAST:  58m OMNIPAQUE IOHEXOL 300 MG/ML  SOLN  COMPARISON:  None.  FLUOROSCOPY TIME:  2 min 5 seconds  FINDINGS: On the scout radiograph a gastrostomy tube is in place. The contrast material placed within the stomach on 10/22/2013 has progressed into the colon.  The patient ingested 50 cc of water-soluble contrast material. The esophagus is patent. There is no evidence of esophageal mass or stricture. Postoperative appearance of the stomach compatible with gastrojejunostomy. The gastro jejunal anastomosis is patent.  When the patient is in the recumbent orientation several episodes of gastroesophageal reflux noted.  IMPRESSION: 1. Patent esophagus and gastrojejunal anastomosis. 2. Reflux.   Electronically Signed   By: Kerby Moors M.D.   On: 10/23/2013 13:00    ASSESSMENT AND PLAN: this is a very pleasant 48 years old [place] American female recently diagnosed with a stage II a gastric adenocarcinoma status post resection with lymph node dissection. Her postoperative course was complicated with intractable nausea and vomiting which is currently better controlled. PET scan performed 11/11/2013 reveal no evidence for residual or recurrent hypermetabolic tumor and no evidence for distant metastatic disease. Patient was discussed with him and also seen by Dr. [name]. She will proceed with day 8 of cycle #1 of her adjuvant systemic chemotherapy with 5-FU 600 mg/M2 with leucovorin on days 1, 8, 15, 22, 29 and 36 every 8 weeksI recommended for the patient to have the PET scan performed as scheduled in the next few days to rule out any metastatic disease. We will plan to give her 5 weekly treatments with one week off and then an  additional 5 weekly treatments followed by restaging Ct scans. She is status post Port a Cath placement under the care of Dr. Barry Dienes. She was advised to call immediately if she has any concerning symptoms in the interval.  The patient voices understanding of current disease status and treatment options and is in agreement with the current care plan.  All questions were answered. The patient knows to call the clinic with any problems, questions or concerns. We can certainly see the patient much sooner if necessary.  Carlton Adam PA-C  ADDENDUM: Hematology/Oncology Attending:  I had a face to face encounter with the patient. I recommended her care plan. 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 The patient was started adjuvant chemotherapy with weekly 5-FU and leucovorin status post 1 dose. She tolerated the first week of her treatment fairly well with no significant adverse effects except for the persistent nausea. She is currently on several by emetics including Zofran ODT, Zyprexa and metoclopramide as well as Ativan. I recommended for her to proceed with week #2 of the first cycle as scheduled. We will delay the course of concurrent chemoradiation until the patient received at least 2 cycles of adjuvant chemotherapy which may take up to 3 months of treatment. I discussed the recommendation with Dr. Isidore Moos. The patient was advised to call immediately if she has any concerning symptoms in the interval.  Disclaimer: This note was dictated with voice recognition software. Similar sounding words can inadvertently be transcribed and may not be corrected upon review. Eilleen Kempf., MD 11/29/2013

## 2013-11-26 NOTE — Patient Instructions (Signed)
[facility] Discharge Instructions for Patients Receiving Chemotherapy  Today you received the following chemotherapy agents :  Leucovorin, Fluorouracil.  To help prevent nausea and vomiting after your treatment, we encourage you to take your nausea medication as prescribed by your physician.   If you develop nausea and vomiting that is not controlled by your nausea medication, call the clinic.   BELOW ARE SYMPTOMS THAT SHOULD BE REPORTED IMMEDIATELY:  *FEVER GREATER THAN 100.5 F  *CHILLS WITH OR WITHOUT FEVER  NAUSEA AND VOMITING THAT IS NOT CONTROLLED WITH YOUR NAUSEA MEDICATION  *UNUSUAL SHORTNESS OF BREATH  *UNUSUAL BRUISING OR BLEEDING  TENDERNESS IN MOUTH AND THROAT WITH OR WITHOUT PRESENCE OF ULCERS  *URINARY PROBLEMS  *BOWEL PROBLEMS  UNUSUAL RASH Items with * indicate a potential emergency and should be followed up as soon as possible.  Feel free to call the clinic you have any questions or concerns. The clinic phone number is [phone].

## 2013-11-26 NOTE — Progress Notes (Signed)
48 year old female diagnosed with gastric cancer status post gastrectomy 09/20/2013.  Past medical history includes essential hypertension, anemia, chronic constipation, and acute kidney injury.  Medications include Prozac, Ativan, Reglan, multivitamin, Zofran, Protonix, and MiraLax.  Labs include BUN<3.0, albumin 3.4.  Height: 60 inches. Weight: 127.8 pounds October 6. Usual body weight: 175 pounds June 2015. BMI: 23.37.  Patient meets criteria for severe protein calorie malnutrition context of chronic illness as evidenced by 27% weight loss in greater than 3 months and less than 75% energy intake for greater than one month.  Patient also had severe depletion of both body fat and muscle mass on physical exam.  Patient reports that she eats very small amounts.  She states that she has been draining her gastric tube when she gets overly full.  Patient reports she has not used Jejunostomy feeding tube since she left the hospital.  Patient does not want to use jejunostomy feeding tube at this time.  She would like assistance with increasing her intake by mouth.  Patient has only been flushing feeding tube every other day.  She states she feels terrible when she flushes it.  Nutrition diagnosis: Inadequate oral intake related to gastric cancer and associated treatments as evidenced by 27% weight loss over 3 months.  Intervention:  Patient educated to consume high protein, high calorie foods 3 times a day with meals. Fact sheets provided on ways to increase calories and protein. Recommended patient consume 4 ounces Ensure Plus 3 times a day between meals. Also recommended patient consume Ensure Plus when taking medications. Provided samples of Ensure Plus and boost breeze for patient. Educated patient on flushing feeding tube daily.  Patient able to demonstrate proper flushing using gravity. Questions were answered.  Teach back method used.  Contact information was given.  Monitoring,  evaluation, goals: Patient will tolerate increased oral intake to minimize further weight loss and avoid dumping syndrome.  Next visit: Tuesday, October 13, during chemotherapy.  **Disclaimer: This note was dictated with voice recognition software. Similar sounding words can inadvertently be transcribed and this note may contain transcription errors which may not have been corrected upon publication of note.**

## 2013-11-27 ENCOUNTER — Telehealth: Payer: Self-pay

## 2013-11-27 ENCOUNTER — Ambulatory Visit
Admission: RE | Admit: 2013-11-27 | Payer: No Typology Code available for payment source | Source: Ambulatory Visit | Admitting: Radiation Oncology

## 2013-11-27 NOTE — Telephone Encounter (Signed)
Called and left message for Paula [name] to call pt, pt had some questions for her, unspecified.

## 2013-11-28 ENCOUNTER — Ambulatory Visit: Payer: No Typology Code available for payment source

## 2013-11-29 ENCOUNTER — Ambulatory Visit: Payer: No Typology Code available for payment source

## 2013-11-29 NOTE — Patient Instructions (Signed)
Continue labs and chemotherapy as scheduled  Follow up in 2 weeks 

## 2013-12-02 ENCOUNTER — Other Ambulatory Visit: Payer: Self-pay | Admitting: *Deleted

## 2013-12-02 ENCOUNTER — Ambulatory Visit: Payer: No Typology Code available for payment source

## 2013-12-02 DIAGNOSIS — C169 Malignant neoplasm of stomach, unspecified: Secondary | ICD-10-CM

## 2013-12-02 MED ORDER — FENTANYL 25 MCG/HR TD PT72: 25.0000 ug | MEDICATED_PATCH | TRANSDERMAL | Status: DC

## 2013-12-02 MED ORDER — HYDROMORPHONE HCL 1 MG/ML PO LIQD: 1.0000 mg | ORAL | Status: DC | PRN

## 2013-12-03 ENCOUNTER — Other Ambulatory Visit (HOSPITAL_BASED_OUTPATIENT_CLINIC_OR_DEPARTMENT_OTHER): Payer: No Typology Code available for payment source

## 2013-12-03 ENCOUNTER — Ambulatory Visit (HOSPITAL_BASED_OUTPATIENT_CLINIC_OR_DEPARTMENT_OTHER): Payer: No Typology Code available for payment source | Admitting: Nurse Practitioner

## 2013-12-03 ENCOUNTER — Ambulatory Visit: Payer: No Typology Code available for payment source | Admitting: Nutrition

## 2013-12-03 ENCOUNTER — Ambulatory Visit (HOSPITAL_BASED_OUTPATIENT_CLINIC_OR_DEPARTMENT_OTHER): Payer: No Typology Code available for payment source

## 2013-12-03 ENCOUNTER — Telehealth: Payer: Self-pay | Admitting: Medical Oncology

## 2013-12-03 ENCOUNTER — Telehealth: Payer: Self-pay | Admitting: *Deleted

## 2013-12-03 ENCOUNTER — Other Ambulatory Visit: Payer: Self-pay | Admitting: Nurse Practitioner

## 2013-12-03 ENCOUNTER — Ambulatory Visit: Payer: No Typology Code available for payment source

## 2013-12-03 VITALS — BP 106/80 | HR 97 | Temp 98.0°F | Resp 16

## 2013-12-03 DIAGNOSIS — K9423 Gastrostomy malfunction: Secondary | ICD-10-CM

## 2013-12-03 DIAGNOSIS — C169 Malignant neoplasm of stomach, unspecified: Secondary | ICD-10-CM

## 2013-12-03 DIAGNOSIS — G8929 Other chronic pain: Secondary | ICD-10-CM

## 2013-12-03 DIAGNOSIS — M545 Low back pain: Secondary | ICD-10-CM

## 2013-12-03 DIAGNOSIS — M549 Dorsalgia, unspecified: Secondary | ICD-10-CM

## 2013-12-03 DIAGNOSIS — Z5111 Encounter for antineoplastic chemotherapy: Secondary | ICD-10-CM

## 2013-12-03 DIAGNOSIS — C779 Secondary and unspecified malignant neoplasm of lymph node, unspecified: Secondary | ICD-10-CM

## 2013-12-03 DIAGNOSIS — R109 Unspecified abdominal pain: Secondary | ICD-10-CM

## 2013-12-03 DIAGNOSIS — T8131XA Disruption of external operation (surgical) wound, not elsewhere classified, initial encounter: Secondary | ICD-10-CM

## 2013-12-03 DIAGNOSIS — R634 Abnormal weight loss: Secondary | ICD-10-CM

## 2013-12-03 LAB — CBC WITH DIFFERENTIAL/PLATELET
BASO%: 0.4 % (ref 0.0–2.0)
Basophils Absolute: 0 10*3/uL (ref 0.0–0.1)
EOS%: 8 % — AB (ref 0.0–7.0)
Eosinophils Absolute: 0.3 10*3/uL (ref 0.0–0.5)
HEMATOCRIT: 27.5 % — AB (ref 34.8–46.6)
HGB: 8.5 g/dL — ABNORMAL LOW (ref 11.6–15.9)
LYMPH#: 1.4 10*3/uL (ref 0.9–3.3)
LYMPH%: 44.5 % (ref 14.0–49.7)
MCH: 24.3 pg — ABNORMAL LOW (ref 25.1–34.0)
MCHC: 31.1 g/dL — AB (ref 31.5–36.0)
MCV: 78.3 fL — ABNORMAL LOW (ref 79.5–101.0)
MONO#: 0 10*3/uL — ABNORMAL LOW (ref 0.1–0.9)
MONO%: 0.9 % (ref 0.0–14.0)
NEUT#: 1.5 10*3/uL (ref 1.5–6.5)
NEUT%: 46.2 % (ref 38.4–76.8)
Platelets: 199 10*3/uL (ref 145–400)
RBC: 3.51 10*6/uL — AB (ref 3.70–5.45)
RDW: 16.8 % — AB (ref 11.2–14.5)
WBC: 3.2 10*3/uL — ABNORMAL LOW (ref 3.9–10.3)

## 2013-12-03 LAB — COMPREHENSIVE METABOLIC PANEL (CC13)
ALT: 18 U/L (ref 0–55)
AST: 15 U/L (ref 5–34)
Albumin: 3.4 g/dL — ABNORMAL LOW (ref 3.5–5.0)
Alkaline Phosphatase: 67 U/L (ref 40–150)
Anion Gap: 7 mEq/L (ref 3–11)
BUN: 6.3 mg/dL — ABNORMAL LOW (ref 7.0–26.0)
CHLORIDE: 107 meq/L (ref 98–109)
CO2: 29 mEq/L (ref 22–29)
CREATININE: 0.9 mg/dL (ref 0.6–1.1)
Calcium: 9.2 mg/dL (ref 8.4–10.4)
Glucose: 113 mg/dl (ref 70–140)
Potassium: 3.7 mEq/L (ref 3.5–5.1)
SODIUM: 142 meq/L (ref 136–145)
Total Bilirubin: 0.57 mg/dL (ref 0.20–1.20)
Total Protein: 6.4 g/dL (ref 6.4–8.3)

## 2013-12-03 MED ORDER — SODIUM CHLORIDE 0.9 % IV SOLN
Freq: Once | INTRAVENOUS | Status: AC
  Administered 2013-12-03: 14:00:00 via INTRAVENOUS

## 2013-12-03 MED ORDER — FLUOROURACIL CHEMO INJECTION 2.5 GM/50ML
600.0000 mg/m2 | Freq: Once | INTRAVENOUS | Status: AC
  Administered 2013-12-03: 950 mg via INTRAVENOUS
  Filled 2013-12-03: qty 19

## 2013-12-03 MED ORDER — PROCHLORPERAZINE MALEATE 10 MG PO TABS
ORAL_TABLET | ORAL | Status: AC
  Filled 2013-12-03: qty 1

## 2013-12-03 MED ORDER — LEUCOVORIN CALCIUM INJECTION 350 MG
500.0000 mg/m2 | Freq: Once | INTRAMUSCULAR | Status: AC
  Administered 2013-12-03: 810 mg via INTRAVENOUS
  Filled 2013-12-03: qty 40.5

## 2013-12-03 MED ORDER — HYDROMORPHONE HCL 4 MG/ML IJ SOLN
1.0000 mg | INTRAMUSCULAR | Status: AC
Start: 2013-12-03 — End: 2013-12-03
  Administered 2013-12-03: 1 mg via INTRAVENOUS

## 2013-12-03 MED ORDER — SODIUM CHLORIDE 0.9 % IJ SOLN
10.0000 mL | INTRAMUSCULAR | Status: DC | PRN
  Administered 2013-12-03: 10 mL
  Filled 2013-12-03: qty 10

## 2013-12-03 MED ORDER — HYDROMORPHONE HCL 4 MG/ML IJ SOLN
INTRAMUSCULAR | Status: AC
  Filled 2013-12-03: qty 1

## 2013-12-03 MED ORDER — HEPARIN SOD (PORK) LOCK FLUSH 100 UNIT/ML IV SOLN
500.0000 [IU] | Freq: Once | INTRAVENOUS | Status: AC | PRN
  Administered 2013-12-03: 500 [IU]
  Filled 2013-12-03: qty 5

## 2013-12-03 MED ORDER — LIDOCAINE-PRILOCAINE 2.5-2.5 % EX CREA
TOPICAL_CREAM | CUTANEOUS | Status: AC
  Filled 2013-12-03: qty 5

## 2013-12-03 MED ORDER — PROCHLORPERAZINE MALEATE 10 MG PO TABS
10.0000 mg | ORAL_TABLET | Freq: Once | ORAL | Status: AC
  Administered 2013-12-03: 10 mg via ORAL

## 2013-12-03 NOTE — Progress Notes (Signed)
Nutrition followup completed with patient in chemotherapy.  There is no new weight available to monitor since last visit.  Patient states she is trying to eat every few hours.  She tolerates foods like tomato noodle soup, grits, eggs, bacon, and yogurt.  She has tolerated 4 ounces of Ensure Plus 3 times a day.  Patient continues to complain of fullness.  Patient would like to have feeding tube evaluated.  Patient is not using Jejunostomy for feeding. She states it has green liquid coming out of it.  RN has arranged for patient to see provider.  Diagnosis: Inadequate oral intake improved.  Intervention: Patient to continue small, frequent, high-calorie, high-protein meals. Educated patient to attempt to increase Ensure Plus 6 ounces 3 times a day. Patient also enjoys boost breeze and appears to tolerate. Once again educated patient on eating status post gastrectomy. Questions were answered.  Teach back method used.  Monitoring, evaluation, goals: Patient will work to increase oral intake to minimize weight loss.  Next visit: Tuesday, October 20, during chemotherapy.   **Disclaimer: This note was dictated with voice recognition software. Similar sounding words can inadvertently be transcribed and this note may contain transcription errors which may not have been corrected upon publication of note.**

## 2013-12-03 NOTE — Telephone Encounter (Signed)
On 12-03-13 Paula M. Came down got medical records from me .

## 2013-12-03 NOTE — Progress Notes (Signed)
Pain 9/10 in stomach and back.  She has dilaudid 1mg /ml at home that she states "I only take it a few times each day".  Pt also has fentanyl patch 51mcg.  Advised that she can take the dilaudid up to every 4 hours as needed.  Requesting dilaudid while in the infusion room.    Pt states that she does not want her PEG tube anymore, there is green discharge at the site and she states it is painful.  Advised she contact Dr Barry Dienes.  Asked Selena Lesser to come and evaluate patient PEG tube and pain issues   Per Selena Lesser, okay to give 1mg  dilaudid for 9/10 pain in stomach and abdomen.  Given at 1358.  Reassessed pt at 1445, pt states pain is a 3/10.    Per [employer], wound culture obtained at the PEG tube site.     When port deaccessed, noticed incision site had a place on the left that appeared dehisced.  Informed Selena Lesser, NP, she came to bedside to evaluate patient.  Recommended to place bandaid on site and change BID and prn.  Cyndee will contact Dr Chrisandra Carota office regarding incision site and PEG tube.  2x2 dressing placed over site.

## 2013-12-03 NOTE — Telephone Encounter (Signed)
Pt running late, advised to come in for labs/treatment. Informed she will receive treatment if labs are within normal. Pt verbalizes understanding.

## 2013-12-03 NOTE — Patient Instructions (Signed)
[facility] Discharge Instructions for Patients Receiving Chemotherapy  Today you received the following chemotherapy agents 5fu, leucovorin  To help prevent nausea and vomiting after your treatment, we encourage you to take your nausea medication as needed   If you develop nausea and vomiting that is not controlled by your nausea medication, call the clinic.   BELOW ARE SYMPTOMS THAT SHOULD BE REPORTED IMMEDIATELY:  *FEVER GREATER THAN 100.5 F  *CHILLS WITH OR WITHOUT FEVER  NAUSEA AND VOMITING THAT IS NOT CONTROLLED WITH YOUR NAUSEA MEDICATION  *UNUSUAL SHORTNESS OF BREATH  *UNUSUAL BRUISING OR BLEEDING  TENDERNESS IN MOUTH AND THROAT WITH OR WITHOUT PRESENCE OF ULCERS  *URINARY PROBLEMS  *BOWEL PROBLEMS  UNUSUAL RASH Items with * indicate a potential emergency and should be followed up as soon as possible.  Feel free to call the clinic you have any questions or concerns. The clinic phone number is [phone].    

## 2013-12-04 ENCOUNTER — Encounter: Payer: Self-pay | Admitting: Nurse Practitioner

## 2013-12-04 ENCOUNTER — Telehealth: Payer: Self-pay | Admitting: *Deleted

## 2013-12-04 ENCOUNTER — Ambulatory Visit: Payer: No Typology Code available for payment source

## 2013-12-04 DIAGNOSIS — T8131XA Disruption of external operation (surgical) wound, not elsewhere classified, initial encounter: Secondary | ICD-10-CM | POA: Insufficient documentation

## 2013-12-04 DIAGNOSIS — K9423 Gastrostomy malfunction: Secondary | ICD-10-CM | POA: Insufficient documentation

## 2013-12-04 DIAGNOSIS — R634 Abnormal weight loss: Secondary | ICD-10-CM | POA: Insufficient documentation

## 2013-12-04 NOTE — Telephone Encounter (Signed)
Per Selena Lesser, NP - pt needs to see Dr. Barry Dienes regarding PAC site and PEG tube. Got patient appt Thursday, 12/05/13 at 3:45 to see Dr. Barry Dienes. Called patient and let her know. Gave her office address and telephone number for [facility]. Patient agreeable to appt tomorrow.

## 2013-12-04 NOTE — Assessment & Plan Note (Signed)
Patient's left upper chest Port-A-Cath incision site with one pinpoint area of dehiscent.  Patient states that there is occasional clear, serous fluid draining from the site.  No evidence of erythema, edema, warmth, or red streaks.  Area is nontender to palpation.  Advised patient to keep area covered with a gauze pad; and to follow up with Dr. Barry Dienes general surgeon.  Advised patient that I would call Dr. Marlowe Aschoff office to attempt to obtain a follow up appointment for her followup/evaluation of both of Port-A-Cath and PEG tube.

## 2013-12-04 NOTE — Assessment & Plan Note (Signed)
Patient does suffer with chronic low back and abdominal pain.  I she is currently on a fentanyl patch at 25 mcg; and takes Dilaudid elixir per her PEG tube for breakthrough pain.  Patient denied need for refill of these medications at this time.  I she was given Dilaudid IV for breakthrough pain while at the [place] today.

## 2013-12-04 NOTE — Assessment & Plan Note (Signed)
Patient's PEG tube does have some trace purulent drainage around the insertion site; but no erythema, edema, warmth, or red streaks noted at site.  There is no foul order to this discharge.  Did obtain a wound culture of the discharge; and will await results.  Patient is quite adamant that she would like to have the PEG tube removed; since she is only using it for medication administration.  She states she is comfortable taking the Dilaudid tablets orally in the future.  Advised patient that she should followup with Dr. Barry Dienes in regards to possible PEG tube removal.

## 2013-12-04 NOTE — Assessment & Plan Note (Signed)
Patient received cycle 1, day 15 of her [facility] 5-FU/leucovorin chemotherapy regimen on 12/03/2013.  She is scheduled to receive cycle 1, day 22 of the same regimen on 12/10/2013.

## 2013-12-04 NOTE — Assessment & Plan Note (Signed)
Patient continues with some anorexia and weight loss.  She states that she is able to take all of her nutrition up her mouth now; and only uses her feeding tube for her Dilaudid elixir.  She met with a nutritionist earlier today.

## 2013-12-04 NOTE — Progress Notes (Signed)
SYMPTOM MANAGEMENT CLINIC   HPI: Paula [name] 48 y.o. female diagnosed with gastric carcinoma.  Currently undergoing [facility] 5-FU/leucovorin chemotherapy regimen.  Patient presented today to receive cycle 1, day 15 of her chemotherapy.  She continues to complain of some chronic back and abdominal discomfort.  She states she uses fentanyl patch at 25 mcg; and Dilaudid elixir per PEG tube for breakthrough pain.  She is requesting some Dilaudid why she is here at the [facility] for breakthrough pain as well.  She continues to have little appetite; but is trying to eat multiple small meals throughout the day.  She has plans to meet with the nutritionist today as well.  I she denies any worsening symptoms of either fatigue, nausea/vomiting, or diarrhea.  She denies any recent fevers or chills.  I she is does complain of some purulent drainage at her PEG tube insertion site.  She states she has to clean IV insertion site of several times through the day; and often changes her shirt up to 2 times per day due to the drainage.  She also states that the PEG tube drainage from the insertion site has a foul odor.  Also, the [facility] infusion area nurse noted the patient does have a very small, pinpoint area of dehiscence to the left upper chest Port-A-Cath site.   HPI  CURRENT THERAPY: Upcoming Treatment Dates - gastric [place] 5FU / Leucovorin D [phone] q56d Days with orders from any treatment category:  12/10/2013      SCHEDULING COMMUNICATION      prochlorperazine (COMPAZINE) tablet 10 mg      leucovorin 810 mg in dextrose 5 % 250 mL infusion      fluorouracil (ADRUCIL) chemo injection 950 mg      sodium chloride 0.9 % injection 10 mL      heparin lock flush 100 unit/mL      heparin lock flush 100 unit/mL      alteplase (CATHFLO ACTIVASE) injection 2 mg      sodium chloride 0.9 % injection 3 mL      Cold Pack 1 packet      0.9 %  sodium chloride infusion    TREATMENT CONDITIONS 12/17/2013      SCHEDULING COMMUNICATION      prochlorperazine (COMPAZINE) tablet 10 mg      leucovorin 810 mg in dextrose 5 % 250 mL infusion      fluorouracil (ADRUCIL) chemo injection 950 mg      sodium chloride 0.9 % injection 10 mL      heparin lock flush 100 unit/mL      heparin lock flush 100 unit/mL      alteplase (CATHFLO ACTIVASE) injection 2 mg      sodium chloride 0.9 % injection 3 mL      Cold Pack 1 packet      0.9 %  sodium chloride infusion      TREATMENT CONDITIONS 12/24/2013      SCHEDULING COMMUNICATION      prochlorperazine (COMPAZINE) tablet 10 mg      leucovorin 810 mg in dextrose 5 % 250 mL infusion      fluorouracil (ADRUCIL) chemo injection 950 mg      sodium chloride 0.9 % injection 10 mL      heparin lock flush 100 unit/mL      heparin lock flush 100 unit/mL      alteplase (CATHFLO ACTIVASE) injection 2 mg  sodium chloride 0.9 % injection 3 mL      Cold Pack 1 packet      0.9 %  sodium chloride infusion      TREATMENT CONDITIONS    ROS  Past Medical History  Diagnosis Date  . Headache(784.0) 2011    Migraines  . Esophageal reflux     On omeprazole  . Costochondritis   . Hypertension     started around 2011  . Ovarian cyst   . Stomach cancer   . Family history of cancer   . Heart murmur     Past Surgical History  Procedure Laterality Date  . Oophorectomy      Around 1985 left ovary removal  . Tonsillectomy      Around 1994  . Tubal ligation    . Esophagogastroduodenoscopy N/A 09/16/2013    Procedure: ESOPHAGOGASTRODUODENOSCOPY (EGD);  Surgeon: Wonda Horner, MD;  Location: [facility];  Service: Endoscopy;  Laterality: N/A;  . Laparoscopy N/A 09/20/2013    Procedure: DIAGNOSTIC LAPAROSCOPY,DISTAL GASTRECTOMY AND FEEDING GASTROJEJUNOSTOMY;  Surgeon: [name], MD;  Location: [facility];  Service: General;  Laterality: N/A;  . Portacath placement Left 11/18/2013    Procedure: INSERTION PORT-A-CATH;  Surgeon:  [name], MD;  Location: [facility];  Service: General;  Laterality: Left;    has Chest pain; Essential hypertension; Murmur; Mitral regurgitation; Left knee injury; Low back pain; PTSD (post-traumatic stress disorder); Migraine headache; Antral ulcer; Gastric carcinoma s/p Distal gastrectomy/GJ (Billroth II) 09/20/2013; Malnutrition of moderate degree; Back pain; Intractable nausea and vomiting; Protein-calorie malnutrition, severe; Anemia, unspecified; Pain of left upper arm; Gastric atony; Constipation, chronic; Chronic low back pain; Acute kidney injury; Palliative care encounter; Nausea & vomiting; Abdominal pain, left lower quadrant; Somatization disorder; Cancer of antrum of stomach; Family history of cancer; Weight loss; PEG tube malfunction; and Dehiscence of incision on her problem list.     has No Known Allergies.    Medication List       This list is accurate as of: 12/03/13 11:59 PM.  Always use your most recent med list.               carvedilol 6.25 MG tablet  Commonly known as:  COREG  Take 6.25 mg by mouth 2 (two) times daily with a meal.     ENSURE COMPLETE PO  Take 237 mLs by mouth daily.     fentaNYL 25 MCG/HR patch  Commonly known as:  DURAGESIC - dosed mcg/hr  Place 1 patch (25 mcg total) onto the skin every 3 (three) days.     HYDROmorphone HCl 1 MG/ML Liqd  Commonly known as:  DILAUDID  Place 1 mL (1 mg total) into feeding tube every 4 (four) hours as needed for severe pain.     lidocaine 5 %  Commonly known as:  LIDODERM  Place 1 patch onto the skin daily as needed. pain     lidocaine-prilocaine cream  Commonly known as:  EMLA  Apply 1 application topically as needed. Apply to port 1 hour before chemo     LORazepam 1 MG tablet  Commonly known as:  ATIVAN  Take 1 mg by mouth 3 (three) times daily.     metoCLOPramide 10 MG tablet  Commonly known as:  REGLAN  Take 1 tablet (10 mg total) by mouth 4 (four) times daily.     MULTIVITAMIN & MINERAL PO    Take 1 tablet by mouth daily.     OLANZapine zydis 5 MG  disintegrating tablet  Commonly known as:  ZYPREXA  Take 1 tablet (5 mg total) by mouth at bedtime.     ondansetron 8 MG disintegrating tablet  Commonly known as:  ZOFRAN-ODT  Take 1 tablet (8 mg total) by mouth every 8 (eight) hours as needed for nausea or vomiting.     pantoprazole 40 MG tablet  Commonly known as:  PROTONIX  Take 1 tablet (40 mg total) by mouth 2 (two) times daily before a meal.         PHYSICAL EXAMINATION  Blood pressure , last menstrual period 08/21/2013.  Physical Exam  Nursing note and vitals reviewed. Constitutional: She is oriented to person, place, and time. Vital signs are normal. She appears unhealthy.  HENT:  Head: Normocephalic.  Mouth/Throat: Oropharynx is clear and moist.  Eyes: Conjunctivae and EOM are normal. Pupils are equal, round, and reactive to light. No scleral icterus.  Neck: Normal range of motion. Neck supple. No JVD present. No tracheal deviation present. No thyromegaly present.  Cardiovascular: Normal rate, regular rhythm, normal heart sounds and intact distal pulses.   Pulmonary/Chest: Effort normal and breath sounds normal. No respiratory distress. She has no wheezes.  Abdominal: Soft. Bowel sounds are normal. She exhibits no distension. There is no tenderness. There is no rebound.  PEG tube insertion site with trace purulent drainage noted.  Discharge has no pallor.  PEG tube appears intact.  Skin surrounding PEG tube insertion site with no evidence of action.  Musculoskeletal: Normal range of motion. She exhibits no edema and no tenderness.  Lymphadenopathy:    She has no cervical adenopathy.  Neurological: She is alert and oriented to person, place, and time. Gait normal.  Skin: Skin is warm and dry. No rash noted. No erythema.  Left upper chest Port-A-Cath site with pinpoint and dehiscence noted.  No evidence of infection.  Psychiatric: Affect normal.    LABORATORY  DATA:. Appointment on 12/03/2013  Component Date Value Ref Range Status  . WBC 12/03/2013 3.2* 3.9 - 10.3 10e3/uL Final  . NEUT# 12/03/2013 1.5  1.5 - 6.5 10e3/uL Final  . HGB 12/03/2013 8.5* 11.6 - 15.9 g/dL Final  . HCT 12/03/2013 27.5* 34.8 - 46.6 % Final  . Platelets 12/03/2013 199  145 - 400 10e3/uL Final  . MCV 12/03/2013 78.3* 79.5 - 101.0 fL Final  . MCH 12/03/2013 24.3* 25.1 - 34.0 pg Final  . MCHC 12/03/2013 31.1* 31.5 - 36.0 g/dL Final  . RBC 12/03/2013 3.51* 3.70 - 5.45 10e6/uL Final  . RDW 12/03/2013 16.8* 11.2 - 14.5 % Final  . lymph# 12/03/2013 1.4  0.9 - 3.3 10e3/uL Final  . MONO# 12/03/2013 0.0* 0.1 - 0.9 10e3/uL Final  . Eosinophils Absolute 12/03/2013 0.3  0.0 - 0.5 10e3/uL Final  . Basophils Absolute 12/03/2013 0.0  0.0 - 0.1 10e3/uL Final  . NEUT% 12/03/2013 46.2  38.4 - 76.8 % Final  . LYMPH% 12/03/2013 44.5  14.0 - 49.7 % Final  . MONO% 12/03/2013 0.9  0.0 - 14.0 % Final  . EOS% 12/03/2013 8.0* 0.0 - 7.0 % Final  . BASO% 12/03/2013 0.4  0.0 - 2.0 % Final  . Sodium 12/03/2013 142  136 - 145 mEq/L Final  . Potassium 12/03/2013 3.7  3.5 - 5.1 mEq/L Final  . Chloride 12/03/2013 107  98 - 109 mEq/L Final  . CO2 12/03/2013 29  22 - 29 mEq/L Final  . Glucose 12/03/2013 113  70 - 140 mg/dl Final  . BUN 12/03/2013 6.3*  7.0 - 26.0 mg/dL Final  . Creatinine 12/03/2013 0.9  0.6 - 1.1 mg/dL Final  . Total Bilirubin 12/03/2013 0.57  0.20 - 1.20 mg/dL Final  . Alkaline Phosphatase 12/03/2013 67  40 - 150 U/L Final  . AST 12/03/2013 15  5 - 34 U/L Final  . ALT 12/03/2013 18  0 - 55 U/L Final  . Total Protein 12/03/2013 6.4  6.4 - 8.3 g/dL Final  . Albumin 12/03/2013 3.4* 3.5 - 5.0 g/dL Final  . Calcium 12/03/2013 9.2  8.4 - 10.4 mg/dL Final  . Anion Gap 12/03/2013 7  3 - 11 mEq/L Final     RADIOGRAPHIC STUDIES: No results found.  ASSESSMENT/PLAN:    Gastric carcinoma s/p Distal gastrectomy/GJ (Billroth II) 09/20/2013  Assessment & Plan Patient received cycle 1,  day 15 of her [facility] 5-FU/leucovorin chemotherapy regimen on 12/03/2013.  She is scheduled to receive cycle 1, day 22 of the same regimen on 12/10/2013.   Chronic low back pain  Assessment & Plan Patient does suffer with chronic low back and abdominal pain.  I she is currently on a fentanyl patch at 25 mcg; and takes Dilaudid elixir per her PEG tube for breakthrough pain.  Patient denied need for refill of these medications at this time.  I she was given Dilaudid IV for breakthrough pain while at the [place] today.   Weight loss  Assessment & Plan Patient continues with some anorexia and weight loss.  She states that she is able to take all of her nutrition up her mouth now; and only uses her feeding tube for her Dilaudid elixir.  She met with a nutritionist earlier today.   PEG tube malfunction  Assessment & Plan Patient's PEG tube does have some trace purulent drainage around the insertion site; but no erythema, edema, warmth, or red streaks noted at site.  There is no foul order to this discharge.  Did obtain a wound culture of the discharge; and will await results.  Patient is quite adamant that she would like to have the PEG tube removed; since she is only using it for medication administration.  She states she is comfortable taking the Dilaudid tablets orally in the future.  Advised patient that she should followup with Dr. Barry Dienes in regards to possible PEG tube removal.   Dehiscence of incision  Assessment & Plan Patient's left upper chest Port-A-Cath incision site with one pinpoint area of dehiscent.  Patient states that there is occasional clear, serous fluid draining from the site.  No evidence of erythema, edema, warmth, or red streaks.  Area is nontender to palpation.  Advised patient to keep area covered with a gauze pad; and to follow up with Dr. Barry Dienes general surgeon.  Advised patient that I would call Dr. Marlowe Aschoff office to attempt to obtain a follow up  appointment for her followup/evaluation of both of Port-A-Cath and PEG tube.   Patient stated understanding of all instructions; and was in agreement with this plan of care. The patient knows to call the clinic with any problems, questions or concerns.   Review/collaboration with Dr. [name] regarding all aspects of patient's visit today.   Total time spent with patient was 25 minutes;  with greater than 80 percent of that time spent in face to face counseling regarding her symptoms, and coordination of care and follow up.  Disclaimer: This note was dictated with voice recognition software. Similar sounding words can inadvertently be transcribed and may not be corrected upon review.  Drue Second, NP 12/04/2013

## 2013-12-05 ENCOUNTER — Encounter: Payer: Self-pay | Admitting: *Deleted

## 2013-12-05 ENCOUNTER — Ambulatory Visit: Payer: No Typology Code available for payment source

## 2013-12-05 NOTE — Progress Notes (Signed)
[place] Social Work  Clinical Social Work was referred by Therapist, sports to review and complete healthcare advance directives.  Clinical Social Worker met with patient originally in [place] office and completed documents in infusion room.    Patient also completed healthcare living will.    Clinical Social Worker notarized documents and made copies for patient/family. Clinical Social Worker will send documents to medical records to be scanned into patient's chart. Clinical Social Worker encouraged patient/family to contact with any additional questions or concerns.  CSW also discussed SSDI application process and encouraged patient to call with any questions.  Polo Riley, MSW, [place] Worker [facility] [phone]

## 2013-12-06 ENCOUNTER — Ambulatory Visit: Payer: No Typology Code available for payment source

## 2013-12-06 LAB — WOUND CULTURE

## 2013-12-09 ENCOUNTER — Ambulatory Visit: Payer: No Typology Code available for payment source

## 2013-12-10 ENCOUNTER — Ambulatory Visit (HOSPITAL_BASED_OUTPATIENT_CLINIC_OR_DEPARTMENT_OTHER): Payer: No Typology Code available for payment source | Admitting: Physician Assistant

## 2013-12-10 ENCOUNTER — Other Ambulatory Visit: Payer: Self-pay

## 2013-12-10 ENCOUNTER — Other Ambulatory Visit: Payer: Self-pay | Admitting: Physician Assistant

## 2013-12-10 ENCOUNTER — Other Ambulatory Visit (HOSPITAL_BASED_OUTPATIENT_CLINIC_OR_DEPARTMENT_OTHER): Payer: No Typology Code available for payment source

## 2013-12-10 ENCOUNTER — Ambulatory Visit: Payer: No Typology Code available for payment source | Admitting: Nutrition

## 2013-12-10 ENCOUNTER — Telehealth: Payer: Self-pay | Admitting: *Deleted

## 2013-12-10 ENCOUNTER — Ambulatory Visit: Payer: Self-pay

## 2013-12-10 ENCOUNTER — Telehealth: Payer: Self-pay | Admitting: Internal Medicine

## 2013-12-10 ENCOUNTER — Ambulatory Visit: Payer: No Typology Code available for payment source

## 2013-12-10 ENCOUNTER — Ambulatory Visit: Payer: Self-pay | Admitting: Physician Assistant

## 2013-12-10 ENCOUNTER — Ambulatory Visit (HOSPITAL_BASED_OUTPATIENT_CLINIC_OR_DEPARTMENT_OTHER): Payer: No Typology Code available for payment source

## 2013-12-10 DIAGNOSIS — Z931 Gastrostomy status: Secondary | ICD-10-CM

## 2013-12-10 DIAGNOSIS — D701 Agranulocytosis secondary to cancer chemotherapy: Secondary | ICD-10-CM

## 2013-12-10 DIAGNOSIS — Z452 Encounter for adjustment and management of vascular access device: Secondary | ICD-10-CM

## 2013-12-10 DIAGNOSIS — C779 Secondary and unspecified malignant neoplasm of lymph node, unspecified: Secondary | ICD-10-CM

## 2013-12-10 DIAGNOSIS — C169 Malignant neoplasm of stomach, unspecified: Secondary | ICD-10-CM

## 2013-12-10 DIAGNOSIS — Z5189 Encounter for other specified aftercare: Secondary | ICD-10-CM

## 2013-12-10 LAB — COMPREHENSIVE METABOLIC PANEL (CC13)
ALK PHOS: 54 U/L (ref 40–150)
ALT: 12 U/L (ref 0–55)
AST: 16 U/L (ref 5–34)
Albumin: 3.2 g/dL — ABNORMAL LOW (ref 3.5–5.0)
Anion Gap: 8 mEq/L (ref 3–11)
BILIRUBIN TOTAL: 1.29 mg/dL — AB (ref 0.20–1.20)
BUN: 13.4 mg/dL (ref 7.0–26.0)
CO2: 28 mEq/L (ref 22–29)
CREATININE: 0.9 mg/dL (ref 0.6–1.1)
Calcium: 8.8 mg/dL (ref 8.4–10.4)
Chloride: 102 mEq/L (ref 98–109)
Glucose: 134 mg/dl (ref 70–140)
Potassium: 4.3 mEq/L (ref 3.5–5.1)
Sodium: 138 mEq/L (ref 136–145)
Total Protein: 6.1 g/dL — ABNORMAL LOW (ref 6.4–8.3)

## 2013-12-10 LAB — CBC WITH DIFFERENTIAL/PLATELET
BASO%: 0 % (ref 0.0–2.0)
Basophils Absolute: 0 10*3/uL (ref 0.0–0.1)
EOS%: 4.9 % (ref 0.0–7.0)
Eosinophils Absolute: 0.1 10*3/uL (ref 0.0–0.5)
HEMATOCRIT: 26.5 % — AB (ref 34.8–46.6)
HGB: 8.4 g/dL — ABNORMAL LOW (ref 11.6–15.9)
LYMPH%: 78.9 % — AB (ref 14.0–49.7)
MCH: 24.4 pg — AB (ref 25.1–34.0)
MCHC: 31.7 g/dL (ref 31.5–36.0)
MCV: 77 fL — ABNORMAL LOW (ref 79.5–101.0)
MONO#: 0 10*3/uL — AB (ref 0.1–0.9)
MONO%: 2.4 % (ref 0.0–14.0)
NEUT#: 0.2 10*3/uL — CL (ref 1.5–6.5)
NEUT%: 13.8 % — AB (ref 38.4–76.8)
NRBC: 0 % (ref 0–0)
PLATELETS: 142 10*3/uL — AB (ref 145–400)
RBC: 3.44 10*6/uL — ABNORMAL LOW (ref 3.70–5.45)
RDW: 16.9 % — ABNORMAL HIGH (ref 11.2–14.5)
WBC: 1.2 10*3/uL — AB (ref 3.9–10.3)
lymph#: 1 10*3/uL (ref 0.9–3.3)

## 2013-12-10 MED ORDER — SODIUM CHLORIDE 0.9 % IJ SOLN
10.0000 mL | INTRAMUSCULAR | Status: DC | PRN
  Administered 2013-12-10: 10 mL via INTRAVENOUS
  Filled 2013-12-10: qty 10

## 2013-12-10 MED ORDER — HEPARIN SOD (PORK) LOCK FLUSH 100 UNIT/ML IV SOLN
500.0000 [IU] | Freq: Once | INTRAVENOUS | Status: AC
  Administered 2013-12-10: 500 [IU] via INTRAVENOUS
  Filled 2013-12-10: qty 5

## 2013-12-10 MED ORDER — ALTEPLASE 2 MG IJ SOLR
2.0000 mg | Freq: Once | INTRAMUSCULAR | Status: AC | PRN
  Administered 2013-12-10: 2 mg
  Filled 2013-12-10: qty 2

## 2013-12-10 MED ORDER — TBO-FILGRASTIM 300 MCG/0.5ML ~~LOC~~ SOSY
300.0000 ug | PREFILLED_SYRINGE | Freq: Once | SUBCUTANEOUS | Status: AC
  Administered 2013-12-10: 300 ug via SUBCUTANEOUS
  Filled 2013-12-10: qty 0.5

## 2013-12-10 NOTE — Progress Notes (Signed)
Nutrition followup completed with patient.  Last weight documented on October 6 was 127.8 pounds.  Patient reports she tolerates Ensure Plus without difficulty.  She continues to complain about feeding tube and odor.  This was evaluated last week however, I let nursing know this continues to be a complaint.  Diagnosis: Inadequate oral intake appears improved.  Intervention: Patient to continue small, frequent, high-calorie, high-protein meals. Educated patient to increase Ensure Plus 3 times a day between meals. Stressed the importance of small meals throughout the day.   Questions answered.  Teach back method used.  Monitoring, evaluation, goals: Patient will continue to work to increase oral intake.  Cannot evaluate weight changes at this time.  Next visit: Tuesday, October 27, during chemotherapy.  **Disclaimer: This note was dictated with voice recognition software. Similar sounding words can inadvertently be transcribed and this note may contain transcription errors which may not have been corrected upon publication of note.**

## 2013-12-10 NOTE — Progress Notes (Signed)
Pt not to be treated today. LPN escorted pt to med onc for visit with Adrena PA

## 2013-12-10 NOTE — Telephone Encounter (Signed)
Pt confirmed labs/ov per 10/20 POF, sent msg to move chemo up closer to MD visit, gave pt AVS.... KJ

## 2013-12-10 NOTE — Telephone Encounter (Signed)
I jave adjusted 10/27 appt

## 2013-12-11 ENCOUNTER — Ambulatory Visit (HOSPITAL_BASED_OUTPATIENT_CLINIC_OR_DEPARTMENT_OTHER): Payer: No Typology Code available for payment source

## 2013-12-11 ENCOUNTER — Other Ambulatory Visit: Payer: Self-pay | Admitting: Physician Assistant

## 2013-12-11 ENCOUNTER — Ambulatory Visit: Payer: No Typology Code available for payment source

## 2013-12-11 DIAGNOSIS — C779 Secondary and unspecified malignant neoplasm of lymph node, unspecified: Secondary | ICD-10-CM

## 2013-12-11 DIAGNOSIS — D701 Agranulocytosis secondary to cancer chemotherapy: Secondary | ICD-10-CM

## 2013-12-11 DIAGNOSIS — C169 Malignant neoplasm of stomach, unspecified: Secondary | ICD-10-CM

## 2013-12-11 DIAGNOSIS — T451X5A Adverse effect of antineoplastic and immunosuppressive drugs, initial encounter: Secondary | ICD-10-CM

## 2013-12-11 DIAGNOSIS — Z5189 Encounter for other specified aftercare: Secondary | ICD-10-CM

## 2013-12-11 MED ORDER — TBO-FILGRASTIM 300 MCG/0.5ML ~~LOC~~ SOSY
300.0000 ug | PREFILLED_SYRINGE | Freq: Once | SUBCUTANEOUS | Status: DC
  Administered 2013-12-11: 300 ug via SUBCUTANEOUS
  Filled 2013-12-11: qty 0.5
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
## 2013-12-11 NOTE — Patient Instructions (Signed)
Your white count is 2 proceed with treatment today as scheduled You'll receive granix injections for the next 3 days to help increase your white cell count Follow up in 1 weeks

## 2013-12-11 NOTE — Patient Instructions (Signed)

## 2013-12-12 ENCOUNTER — Emergency Department (HOSPITAL_COMMUNITY): Payer: No Typology Code available for payment source

## 2013-12-12 ENCOUNTER — Ambulatory Visit: Payer: No Typology Code available for payment source

## 2013-12-12 ENCOUNTER — Inpatient Hospital Stay (HOSPITAL_COMMUNITY)
Admission: EM | Admit: 2013-12-12 | Discharge: 2013-12-14 | DRG: 640 | Disposition: A | Discharge status: Home / Self Care | Payer: No Typology Code available for payment source | Attending: Internal Medicine | Admitting: Internal Medicine

## 2013-12-12 ENCOUNTER — Encounter (HOSPITAL_COMMUNITY): Payer: Self-pay | Admitting: Emergency Medicine

## 2013-12-12 ENCOUNTER — Ambulatory Visit: Payer: Self-pay

## 2013-12-12 DIAGNOSIS — Z23 Encounter for immunization: Secondary | ICD-10-CM

## 2013-12-12 DIAGNOSIS — C163 Malignant neoplasm of pyloric antrum: Secondary | ICD-10-CM

## 2013-12-12 DIAGNOSIS — R634 Abnormal weight loss: Secondary | ICD-10-CM

## 2013-12-12 DIAGNOSIS — Z789 Other specified health status: Secondary | ICD-10-CM

## 2013-12-12 DIAGNOSIS — I10 Essential (primary) hypertension: Secondary | ICD-10-CM

## 2013-12-12 DIAGNOSIS — R011 Cardiac murmur, unspecified: Secondary | ICD-10-CM

## 2013-12-12 DIAGNOSIS — R1032 Left lower quadrant pain: Secondary | ICD-10-CM

## 2013-12-12 DIAGNOSIS — K3184 Gastroparesis: Secondary | ICD-10-CM

## 2013-12-12 DIAGNOSIS — R1110 Vomiting, unspecified: Secondary | ICD-10-CM

## 2013-12-12 DIAGNOSIS — D61810 Antineoplastic chemotherapy induced pancytopenia: Secondary | ICD-10-CM | POA: Diagnosis present

## 2013-12-12 DIAGNOSIS — K219 Gastro-esophageal reflux disease without esophagitis: Secondary | ICD-10-CM | POA: Diagnosis present

## 2013-12-12 DIAGNOSIS — C169 Malignant neoplasm of stomach, unspecified: Secondary | ICD-10-CM

## 2013-12-12 DIAGNOSIS — E43 Unspecified severe protein-calorie malnutrition: Secondary | ICD-10-CM | POA: Diagnosis not present

## 2013-12-12 DIAGNOSIS — F450 Somatization disorder: Secondary | ICD-10-CM

## 2013-12-12 DIAGNOSIS — R112 Nausea with vomiting, unspecified: Secondary | ICD-10-CM | POA: Diagnosis not present

## 2013-12-12 DIAGNOSIS — D649 Anemia, unspecified: Secondary | ICD-10-CM

## 2013-12-12 DIAGNOSIS — K259 Gastric ulcer, unspecified as acute or chronic, without hemorrhage or perforation: Secondary | ICD-10-CM

## 2013-12-12 DIAGNOSIS — Z809 Family history of malignant neoplasm, unspecified: Secondary | ICD-10-CM

## 2013-12-12 DIAGNOSIS — K5909 Other constipation: Secondary | ICD-10-CM

## 2013-12-12 DIAGNOSIS — F4310 Post-traumatic stress disorder, unspecified: Secondary | ICD-10-CM

## 2013-12-12 DIAGNOSIS — M545 Low back pain, unspecified: Secondary | ICD-10-CM

## 2013-12-12 DIAGNOSIS — D61818 Other pancytopenia: Secondary | ICD-10-CM

## 2013-12-12 DIAGNOSIS — D709 Neutropenia, unspecified: Secondary | ICD-10-CM

## 2013-12-12 DIAGNOSIS — Z515 Encounter for palliative care: Secondary | ICD-10-CM

## 2013-12-12 DIAGNOSIS — Y833 Surgical operation with formation of external stoma as the cause of abnormal reaction of the patient, or of later complication, without mention of misadventure at the time of the procedure: Secondary | ICD-10-CM | POA: Diagnosis present

## 2013-12-12 DIAGNOSIS — N179 Acute kidney failure, unspecified: Secondary | ICD-10-CM

## 2013-12-12 DIAGNOSIS — S8992XD Unspecified injury of left lower leg, subsequent encounter: Secondary | ICD-10-CM

## 2013-12-12 DIAGNOSIS — Z79899 Other long term (current) drug therapy: Secondary | ICD-10-CM

## 2013-12-12 DIAGNOSIS — Z87891 Personal history of nicotine dependence: Secondary | ICD-10-CM

## 2013-12-12 DIAGNOSIS — Z6823 Body mass index (BMI) 23.0-23.9, adult: Secondary | ICD-10-CM

## 2013-12-12 DIAGNOSIS — K9423 Gastrostomy malfunction: Secondary | ICD-10-CM

## 2013-12-12 DIAGNOSIS — M79622 Pain in left upper arm: Secondary | ICD-10-CM

## 2013-12-12 DIAGNOSIS — E440 Moderate protein-calorie malnutrition: Secondary | ICD-10-CM

## 2013-12-12 DIAGNOSIS — T8131XD Disruption of external operation (surgical) wound, not elsewhere classified, subsequent encounter: Secondary | ICD-10-CM

## 2013-12-12 DIAGNOSIS — G8929 Other chronic pain: Secondary | ICD-10-CM

## 2013-12-12 DIAGNOSIS — E871 Hypo-osmolality and hyponatremia: Secondary | ICD-10-CM

## 2013-12-12 DIAGNOSIS — I340 Nonrheumatic mitral (valve) insufficiency: Secondary | ICD-10-CM

## 2013-12-12 DIAGNOSIS — R109 Unspecified abdominal pain: Secondary | ICD-10-CM

## 2013-12-12 DIAGNOSIS — Z934 Other artificial openings of gastrointestinal tract status: Secondary | ICD-10-CM

## 2013-12-12 DIAGNOSIS — E876 Hypokalemia: Secondary | ICD-10-CM

## 2013-12-12 LAB — BASIC METABOLIC PANEL
ANION GAP: 12 (ref 5–15)
BUN: 11 mg/dL (ref 6–23)
CALCIUM: 7.6 mg/dL — AB (ref 8.4–10.5)
CO2: 24 mEq/L (ref 19–32)
Chloride: 85 mEq/L — ABNORMAL LOW (ref 96–112)
Creatinine, Ser: 0.53 mg/dL (ref 0.50–1.10)
GFR calc non Af Amer: >90 mL/min (ref >90)
Glucose, Bld: 114 mg/dL — ABNORMAL HIGH (ref 70–99)
Potassium: 3.8 mEq/L (ref 3.7–5.3)
SODIUM: 121 meq/L — AB (ref 137–147)

## 2013-12-12 LAB — URINALYSIS, ROUTINE W REFLEX MICROSCOPIC
Glucose, UA: NEGATIVE mg/dL
Ketones, ur: 40 mg/dL — AB
Leukocytes, UA: NEGATIVE
Nitrite: NEGATIVE
PH: 6 (ref 5.0–8.0)
Protein, ur: 30 mg/dL — AB
Specific Gravity, Urine: 1.035 — ABNORMAL HIGH (ref 1.005–1.030)
UROBILINOGEN UA: 0.2 mg/dL (ref 0.0–1.0)

## 2013-12-12 LAB — CBC WITH DIFFERENTIAL/PLATELET
BASOS ABS: 0 10*3/uL (ref 0.0–0.1)
BASOS PCT: 0 % (ref 0–1)
EOS ABS: 0.1 10*3/uL (ref 0.0–0.7)
Eosinophils Relative: 5 % (ref 0–5)
HEMATOCRIT: 21 % — AB (ref 36.0–46.0)
HEMOGLOBIN: 7.1 g/dL — AB (ref 12.0–15.0)
LYMPHS ABS: 0.9 10*3/uL (ref 0.7–4.0)
LYMPHS PCT: 75 % — AB (ref 12–46)
MCH: 25.4 pg — ABNORMAL LOW (ref 26.0–34.0)
MCHC: 33.8 g/dL (ref 30.0–36.0)
MCV: 75 fL — AB (ref 78.0–100.0)
MONO ABS: 0.2 10*3/uL (ref 0.1–1.0)
MONOS PCT: 16 % — AB (ref 3–12)
Neutro Abs: 0.1 10*3/uL — ABNORMAL LOW (ref 1.7–7.7)
Neutrophils Relative %: 5 % — ABNORMAL LOW (ref 43–77)
Platelets: 120 10*3/uL — ABNORMAL LOW (ref 150–400)
RBC: 2.8 MIL/uL — AB (ref 3.87–5.11)
RDW: 16.6 % — ABNORMAL HIGH (ref 11.5–15.5)
WBC: 1.2 10*3/uL — AB (ref 4.0–10.5)

## 2013-12-12 LAB — POC URINE PREG, ED
PREG TEST UR: NEGATIVE
Preg Test, Ur: NEGATIVE

## 2013-12-12 LAB — I-STAT CHEM 8, ED
BUN: 11 mg/dL (ref 6–23)
Calcium, Ion: 1.15 mmol/L (ref 1.12–1.23)
Chloride: 99 mEq/L (ref 96–112)
Creatinine, Ser: 0.5 mg/dL (ref 0.50–1.10)
GLUCOSE: 119 mg/dL — AB (ref 70–99)
HCT: 23 % — ABNORMAL LOW (ref 36.0–46.0)
HEMOGLOBIN: 7.8 g/dL — AB (ref 12.0–15.0)
POTASSIUM: 3.5 meq/L — AB (ref 3.7–5.3)
Sodium: 136 mEq/L — ABNORMAL LOW (ref 137–147)
TCO2: 26 mmol/L (ref 0–100)

## 2013-12-12 LAB — URINE MICROSCOPIC-ADD ON

## 2013-12-12 LAB — ABO/RH: ABO/RH(D): B POS

## 2013-12-12 LAB — POC OCCULT BLOOD, ED: Fecal Occult Bld: NEGATIVE

## 2013-12-12 MED ORDER — HYDROMORPHONE HCL 1 MG/ML IJ SOLN
1.0000 mg | INTRAMUSCULAR | Status: DC | PRN
  Administered 2013-12-13 (×4): 1 mg via INTRAVENOUS
  Filled 2013-12-12 (×4): qty 1

## 2013-12-12 MED ORDER — PANTOPRAZOLE SODIUM 40 MG PO TBEC
40.0000 mg | DELAYED_RELEASE_TABLET | Freq: Two times a day (BID) | ORAL | Status: DC
  Administered 2013-12-13 – 2013-12-14 (×3): 40 mg via ORAL
  Filled 2013-12-12 (×4): qty 1

## 2013-12-12 MED ORDER — LORAZEPAM 1 MG PO TABS
1.0000 mg | ORAL_TABLET | Freq: Three times a day (TID) | ORAL | Status: DC
  Administered 2013-12-12 – 2013-12-13 (×2): 1 mg via ORAL
  Filled 2013-12-12 (×3): qty 1

## 2013-12-12 MED ORDER — HYDROMORPHONE HCL 1 MG/ML IJ SOLN
1.0000 mg | Freq: Once | INTRAMUSCULAR | Status: DC
  Filled 2013-12-12: qty 1

## 2013-12-12 MED ORDER — HYDROMORPHONE HCL 1 MG/ML IJ SOLN
1.0000 mg | Freq: Once | INTRAMUSCULAR | Status: AC
  Administered 2013-12-12: 1 mg via INTRAVENOUS

## 2013-12-12 MED ORDER — SODIUM CHLORIDE 0.9 % IV BOLUS (SEPSIS)
1000.0000 mL | Freq: Once | INTRAVENOUS | Status: AC
  Administered 2013-12-12: 1000 mL via INTRAVENOUS

## 2013-12-12 MED ORDER — HYDROMORPHONE HCL 1 MG/ML PO LIQD
1.0000 mg | ORAL | Status: DC | PRN
  Filled 2013-12-12: qty 1

## 2013-12-12 MED ORDER — FENTANYL 25 MCG/HR TD PT72
25.0000 ug | MEDICATED_PATCH | TRANSDERMAL | Status: DC
  Administered 2013-12-12: 25 ug via TRANSDERMAL
  Filled 2013-12-12: qty 1

## 2013-12-12 MED ORDER — ONDANSETRON HCL 4 MG/2ML IJ SOLN: 4.0000 mg | Freq: Four times a day (QID) | INTRAMUSCULAR | Status: DC | PRN

## 2013-12-12 MED ORDER — ONDANSETRON HCL 4 MG/2ML IJ SOLN
4.0000 mg | Freq: Once | INTRAMUSCULAR | Status: AC
  Administered 2013-12-12: 4 mg via INTRAVENOUS

## 2013-12-12 MED ORDER — ONDANSETRON HCL 4 MG/2ML IJ SOLN
4.0000 mg | Freq: Once | INTRAMUSCULAR | Status: DC
  Filled 2013-12-12: qty 2

## 2013-12-12 MED ORDER — ONDANSETRON HCL 4 MG PO TABS: 4.0000 mg | ORAL_TABLET | Freq: Four times a day (QID) | ORAL | Status: DC | PRN

## 2013-12-12 MED ORDER — IOHEXOL 300 MG/ML  SOLN
100.0000 mL | Freq: Once | INTRAMUSCULAR | Status: AC | PRN
  Administered 2013-12-12: 100 mL via INTRAVENOUS

## 2013-12-12 MED ORDER — POTASSIUM CHLORIDE IN NACL 20-0.9 MEQ/L-% IV SOLN
INTRAVENOUS | Status: AC
  Administered 2013-12-12: 22:00:00 via INTRAVENOUS
  Filled 2013-12-12: qty 1000

## 2013-12-12 MED ORDER — ONDANSETRON 8 MG PO TBDP: 8.0000 mg | ORAL_TABLET | Freq: Three times a day (TID) | ORAL | Status: DC | PRN

## 2013-12-12 MED ORDER — ONDANSETRON 4 MG PO TBDP: 4.0000 mg | ORAL_TABLET | Freq: Once | ORAL | Status: DC

## 2013-12-12 MED ORDER — HYDROMORPHONE HCL 1 MG/ML IJ SOLN
1.0000 mg | Freq: Once | INTRAMUSCULAR | Status: DC
Start: 2013-12-12 — End: 2013-12-12

## 2013-12-12 MED ORDER — OLANZAPINE 5 MG PO TBDP
5.0000 mg | ORAL_TABLET | Freq: Every day | ORAL | Status: DC
  Administered 2013-12-12 – 2013-12-13 (×2): 5 mg via ORAL
  Filled 2013-12-12 (×3): qty 1

## 2013-12-12 MED ORDER — METOCLOPRAMIDE HCL 10 MG PO TABS
10.0000 mg | ORAL_TABLET | Freq: Four times a day (QID) | ORAL | Status: DC
  Administered 2013-12-12 – 2013-12-14 (×7): 10 mg via ORAL
  Filled 2013-12-12 (×9): qty 1

## 2013-12-12 MED ORDER — IOHEXOL 300 MG/ML  SOLN
50.0000 mL | Freq: Once | INTRAMUSCULAR | Status: AC | PRN
  Administered 2013-12-12: 50 mL

## 2013-12-12 MED ORDER — HYDROMORPHONE HCL 1 MG/ML IJ SOLN
1.0000 mg | Freq: Once | INTRAMUSCULAR | Status: AC
  Administered 2013-12-12: 1 mg via INTRAVENOUS
  Filled 2013-12-12: qty 1

## 2013-12-12 MED ORDER — CARVEDILOL 6.25 MG PO TABS
6.2500 mg | ORAL_TABLET | Freq: Two times a day (BID) | ORAL | Status: DC
  Administered 2013-12-13 – 2013-12-14 (×3): 6.25 mg via ORAL
  Filled 2013-12-12 (×5): qty 1

## 2013-12-12 NOTE — ED Notes (Signed)
Tried for UA, mainly stool

## 2013-12-12 NOTE — ED Notes (Signed)
Awaiting iv team to access port

## 2013-12-12 NOTE — ED Notes (Signed)
Pt sees dr. Earlie Server here at [facility] for stomach ca; pt is currently doing chemotherapy, last tx was Tuesday this week

## 2013-12-12 NOTE — ED Notes (Signed)
Pt states that she has stomach cancer.  States that her feeding tube started leaking and smelling one week ago.  Called her doctor and told there that she was coming to the ER.

## 2013-12-12 NOTE — ED Provider Notes (Signed)
Discussed case with Harvie Heck, PA-C. Transfer of care from Harvie Heck, PA-C at change in shift.   Paula [name] is a 48 y/o F with PMhx of reflux, HTN, ovarian cysts, headache, gastric carcinoma with gastrectomy and J-peg tube placement 09/20/2013 presenting to the ED with leakage from her tube and abdominal pain that has been ongoing for approximately one week. Patient reports that the discomfort is localized to the left upper quadrant where the tube is located-reported that she's noticed a thick yellow drainage that is malodorous. Stated that she's been feeling nauseous but at least 3-4 episodes of emesis-NB/NB per day. Reported that the pain in the left upper quadrant is an aching, shooting, stabbing pain that is continuous. Stated that she had at least 3 episodes of diarrhea today. Stated that she feels mildly faint. Reported that she is undergoing chemotherapy, stated that she has had 3 sessions of chemotherapy, 2 more to go. Reported that her last chemotherapy session was Tuesday. Denied fever, chest pain, shortness of breath, difficulty breathing, hematochezia, melena. PCP Dr. Jarold Song Oncologist Dr. Inda Merlin Surgeon Dr. Channing Mutters  Alert and oriented. GCS 15. Heart rate mildly tachycardia upon auscultation. Radial pulses 2+ bilaterally. Port identified on the left side of the chest negative surrounding erythema, inflammation, drainage. Lungs clear to auscultation to upper and lower lobes. Negative abdominal distention. BS normoactive in all 4 quadrants. PEG tube identified left upper quadrant with minimal surrounding thick yellow discharge noted that is malodorous-tenderness upon palpation surrounding the PEG tube. Generalized discomfort upon palpation to the abdomen.  Results for orders placed during the hospital encounter of 12/12/13  CBC WITH DIFFERENTIAL      Result Value Ref Range   WBC 1.2 (*) 4.0 - 10.5 K/uL   RBC 2.80 (*) 3.87 - 5.11 MIL/uL   Hemoglobin 7.1 (*) 12.0 - 15.0 g/dL    HCT 21.0 (*) 36.0 - 46.0 %   MCV 75.0 (*) 78.0 - 100.0 fL   MCH 25.4 (*) 26.0 - 34.0 pg   MCHC 33.8  30.0 - 36.0 g/dL   RDW 16.6 (*) 11.5 - 15.5 %   Platelets 120 (*) 150 - 400 K/uL   Neutrophils Relative % 5 (*) 43 - 77 %   Neutro Abs 0.1 (*) 1.7 - 7.7 K/uL   Lymphocytes Relative 75 (*) 12 - 46 %   Lymphs Abs 0.9  0.7 - 4.0 K/uL   Monocytes Relative 16 (*) 3 - 12 %   Monocytes Absolute 0.2  0.1 - 1.0 K/uL   Eosinophils Relative 5  0 - 5 %   Eosinophils Absolute 0.1  0.0 - 0.7 K/uL   Basophils Relative 0  0 - 1 %   Basophils Absolute 0.0  0.0 - 0.1 K/uL   RBC Morphology POLYCHROMASIA PRESENT    BASIC METABOLIC PANEL      Result Value Ref Range   Sodium 121 (*) 137 - 147 mEq/L   Potassium 3.8  3.7 - 5.3 mEq/L   Chloride 85 (*) 96 - 112 mEq/L   CO2 24  19 - 32 mEq/L   Glucose, Bld 114 (*) 70 - 99 mg/dL   BUN 11  6 - 23 mg/dL   Creatinine, Ser 0.53  0.50 - 1.10 mg/dL   Calcium 7.6 (*) 8.4 - 10.5 mg/dL   GFR calc non Af Amer >90  >90 mL/min   GFR calc Af Amer >90  >90 mL/min   Anion gap 12  5 - 15  URINALYSIS, ROUTINE  W REFLEX MICROSCOPIC      Result Value Ref Range   Color, Urine AMBER (*) YELLOW   APPearance CLOUDY (*) CLEAR   Specific Gravity, Urine 1.035 (*) 1.005 - 1.030   pH 6.0  5.0 - 8.0   Glucose, UA NEGATIVE  NEGATIVE mg/dL   Hgb urine dipstick SMALL (*) NEGATIVE   Bilirubin Urine SMALL (*) NEGATIVE   Ketones, ur 40 (*) NEGATIVE mg/dL   Protein, ur 30 (*) NEGATIVE mg/dL   Urobilinogen, UA 0.2  0.0 - 1.0 mg/dL   Nitrite NEGATIVE  NEGATIVE   Leukocytes, UA NEGATIVE  NEGATIVE  URINE MICROSCOPIC-ADD ON      Result Value Ref Range   Squamous Epithelial / LPF RARE  RARE   WBC, UA 0-2  <3 WBC/hpf   RBC / HPF 3-6  <3 RBC/hpf   Bacteria, UA RARE  RARE   Casts HYALINE CASTS (*) NEGATIVE   Urine-Other MUCOUS PRESENT    POC URINE PREG, ED      Result Value Ref Range   Preg Test, Ur NEGATIVE  NEGATIVE  POC OCCULT BLOOD, ED      Result Value Ref Range   Fecal  Occult Bld NEGATIVE  NEGATIVE  I-STAT CHEM 8, ED      Result Value Ref Range   Sodium 136 (*) 137 - 147 mEq/L   Potassium 3.5 (*) 3.7 - 5.3 mEq/L   Chloride 99  96 - 112 mEq/L   BUN 11  6 - 23 mg/dL   Creatinine, Ser 0.50  0.50 - 1.10 mg/dL   Glucose, Bld 119 (*) 70 - 99 mg/dL   Calcium, Ion 1.15  1.12 - 1.23 mmol/L   TCO2 26  0 - 100 mmol/L   Hemoglobin 7.8 (*) 12.0 - 15.0 g/dL   HCT 23.0 (*) 36.0 - 46.0 %  POC URINE PREG, ED      Result Value Ref Range   Preg Test, Ur NEGATIVE  NEGATIVE  TYPE AND SCREEN      Result Value Ref Range   ABO/RH(D) B POS     Antibody Screen PENDING     Sample Expiration 12/15/2013     Dg Abd 1 View  12/12/2013   CLINICAL DATA:  Leaking gastrostomy tube.  EXAM: ABDOMEN - 1 VIEW  COMPARISON:  PET scan of November 09, 2013.  FINDINGS: The gastrostomy tube appears to have its distal tip within the small bowel. Normal contrast filling of small bowel is noted. No extravasation or leakage is noted.  IMPRESSION: Distal tip of gastrostomy tube appears to be well positioned within small bowel. No extravasation or leakage is noted.   Electronically Signed   By: Sabino Dick M.D.   On: 12/12/2013 14:32   Ct Abdomen Pelvis W Contrast  12/12/2013   CLINICAL DATA:  Pain and drainage associated with a feeding tube placed 1 month ago. Foul smell from the tube for the past week. Status post distal gastrectomy and gastrojejunostomy for gastric cancer.  EXAM: CT ABDOMEN AND PELVIS WITH CONTRAST  TECHNIQUE: Multidetector CT imaging of the abdomen and pelvis was performed using the standard protocol following bolus administration of intravenous contrast.  CONTRAST:  165mL OMNIPAQUE IOHEXOL 300 MG/ML  SOLN  COMPARISON:  PET-CT dated 11/09/2013, single view abdomen obtained earlier today and abdomen and pelvis CT dated 11/01/2013.  FINDINGS: Again demonstrated is a left anterior percutaneous gastrojejunostomy tube with the tubing coiled within the gastric remnant and tip of the  tube in the  proximal jejunum. A common duct stent is unchanged. No biliary ductal dilatation. The 1.1 x 1.0 cm rounded low-density mass seen in the lateral segment of the left lobe of the liver on 11/01/2013 currently measures 0.9 x 0.9 cm on image number 11 of series 2. On the delayed images through the kidneys, this is smaller with peripheral contrast enhancement isodense to the liver.  No fluid collections or free peritoneal air. The spleen, pancreas, gallbladder, adrenal glands, left kidney, urinary bladder, uterus and left ovary are unremarkable. The right ovary is not visualized. The patient has had an oophorectomy by history. Stable small right renal probable cyst. No intestinal abnormalities or enlarged lymph nodes. Normal appearing appendix. Unremarkable bones. Clear lung bases.  IMPRESSION: 1. The percutaneous gastrojejunostomy tube is unchanged with no fluid collections or free peritoneal air. 2. Stable biliary stent. 3. Stable lateral segment left lobe liver mass with no hypermetabolic activity on the recent PET-CT. This was not present on 09/11/2013, raising the possibility of an interval low metabolic activity metastasis. This could be further evaluated with pre and postcontrast magnetic resonance imaging of the liver if clinically indicated.   Electronically Signed   By: Enrique Sack M.D.   On: 12/12/2013 20:00   CBC noted white blood cell count of 1.2. Hemoglobin 7.1, hematocrit 21.0-when compared to previous labs patient's hemoglobin was approximately 8.4 two days ago. Initial BMP noted hyponatremia of 121, repeat chem 8 identified sodium of 136. UA Urine pregnancy negative. Fecal occult negative. Abdominal plain film noted distal tip of the gastrostomy tube to be well-positioned within the small bowel-no leakage noted. CT abdomen and pelvis with contrast noted percutaneous gastrojejunostomy tube in place with no fluid collections or free peritoneal air. Stable biliary stent. Possible mets to the  liver secondary to new liver lesions when compared to 09/11/2013.  Patient to be admitted to the hospital regarding neutropenia, hyponatremia. Drop in Hgb noted - 8.4 to 7.1 within 2 days - discussed case with attending physician, Dr. Rosalyn Gess who does not recommend transfusion at this time. Negative findings of abscess or infection noted on CT scan of the abdomen. Drainage noted from around the tube. Hyponatremia has improved after fluids from 121 to 136.  8:22 PM This provider spoke with Dr. Loralee Pacas, Hospitalist, discussed case, labs, imaging, vitals in great detail. Patient to be admitted regarding neutropenia, decreased Hgb, hyponatremia, and n/v. Patient agreed to plan of admission. As per admitting physician, recommended observation MedSurg. Patient stable for transfer.   Medications  sodium chloride 0.9 % bolus 1,000 mL (0 mLs Intravenous Stopped 12/12/13 1804)  iohexol (OMNIPAQUE) 300 MG/ML solution 50 mL (50 mLs Other Contrast Given 12/12/13 1420)  HYDROmorphone (DILAUDID) injection 1 mg (1 mg Intravenous Given 12/12/13 1533)  ondansetron (ZOFRAN) injection 4 mg (4 mg Intravenous Given 12/12/13 1533)  sodium chloride 0.9 % bolus 1,000 mL (1,000 mLs Intravenous New Bag/Given 12/12/13 1807)  HYDROmorphone (DILAUDID) injection 1 mg (1 mg Intravenous Given 12/12/13 1914)  iohexol (OMNIPAQUE) 300 MG/ML solution 100 mL (100 mLs Intravenous Contrast Given 12/12/13 1927)   Filed Vitals:   12/12/13 1139 12/12/13 1530 12/12/13 1537  BP: 117/75 130/83   Pulse: 108 101   Temp: 98.1 F (36.7 C)    TempSrc: Oral    Resp: 18  16  SpO2: 100% 100%    Diagnoses that have been ruled out:  None  Diagnoses that are still under consideration:  None  Final diagnoses:  Encounter for gastrojejunal (GJ) tube placement  Hyponatremia  Neutropenia  Intractable vomiting with nausea, vomiting of unspecified type  Anemia, unspecified anemia type  Gastric carcinoma     Jamse Mead, PA-C 12/12/13  2040  Wirt Hemmerich, PA-C 12/12/13 2100

## 2013-12-12 NOTE — ED Notes (Signed)
Pt c/o feeding tube leaking at the bottom of the tube area; tube was placed about a month ago by dr. Barry Dienes here at [facility]; pt sts pain, leaking and a foul smell from the tube x1 week, pt denies fever, pt sts she vomited last pm

## 2013-12-12 NOTE — H&P (Signed)
PCP:   Arnoldo Morale, MD   Chief Complaint:  Peg leaking  HPI: 48 yo female h/o gastric cancer s/p peg placement last month comes in with one week of leaking around the tube and pain around the site.  She has also had n/v reported as intractable by the ED however pt does not endorse this at this point.  No diarrhea.  Pt is hungry and wants to eat.  She is taking food by mouth.  Peg was placed for malnourishment and for supplementation.  She is undergoing chemo for her cancer.  Her na and k were low, this is improved with 2 Liters of ivf in the ED.  We are asked to admit pt for intractable n/v and electrolyte abnormalities.  Review of Systems:  Positive and negative as per HPI otherwise all other systems are negative  Past Medical History: Past Medical History  Diagnosis Date  . Headache(784.0) 2011    Migraines  . Esophageal reflux     On omeprazole  . Costochondritis   . Hypertension     started around 2011  . Ovarian cyst   . Stomach cancer   . Family history of cancer   . Heart murmur    Past Surgical History  Procedure Laterality Date  . Oophorectomy      Around 1985 left ovary removal  . Tonsillectomy      Around 1994  . Tubal ligation    . Esophagogastroduodenoscopy N/A 09/16/2013    Procedure: ESOPHAGOGASTRODUODENOSCOPY (EGD);  Surgeon: Wonda Horner, MD;  Location: [facility];  Service: Endoscopy;  Laterality: N/A;  . Laparoscopy N/A 09/20/2013    Procedure: DIAGNOSTIC LAPAROSCOPY,DISTAL GASTRECTOMY AND FEEDING GASTROJEJUNOSTOMY;  Surgeon: [name], MD;  Location: [facility];  Service: General;  Laterality: N/A;  . Portacath placement Left 11/18/2013    Procedure: INSERTION PORT-A-CATH;  Surgeon: [name], MD;  Location: [facility];  Service: General;  Laterality: Left;    Medications: Prior to Admission medications   Medication Sig Start Date End Date Taking? Authorizing Provider  carvedilol (COREG) 6.25 MG tablet Take 6.25 mg by mouth 2 (two) times daily with a  meal.   Yes Historical Provider, MD  fentaNYL (DURAGESIC - DOSED MCG/HR) 25 MCG/HR patch Place 1 patch (25 mcg total) onto the skin every 3 (three) days. 12/02/13  Yes Curt Bears, MD  HYDROmorphone HCl (DILAUDID) 1 MG/ML LIQD Place 1 mL (1 mg total) into feeding tube every 4 (four) hours as needed for severe pain. 12/02/13  Yes Curt Bears, MD  lidocaine-prilocaine (EMLA) cream Apply 1 application topically as needed. Apply to port 1 hour before chemo 11/13/13  Yes Curt Bears, MD  LORazepam (ATIVAN) 1 MG tablet Take 1 mg by mouth 3 (three) times daily.   Yes Historical Provider, MD  metoCLOPramide (REGLAN) 10 MG tablet Take 1 tablet (10 mg total) by mouth 4 (four) times daily. 11/05/13  Yes Venetia Maxon Rama, MD  Multiple Vitamins-Minerals (MULTIVITAMIN & MINERAL PO) Take 1 tablet by mouth daily.   Yes Historical Provider, MD  OLANZapine zydis (ZYPREXA) 5 MG disintegrating tablet Take 1 tablet (5 mg total) by mouth at bedtime. 11/05/13  Yes Venetia Maxon Rama, MD  ondansetron (ZOFRAN-ODT) 8 MG disintegrating tablet Take 1 tablet (8 mg total) by mouth every 8 (eight) hours as needed for nausea or vomiting. 11/19/13  Yes Curt Bears, MD  pantoprazole (PROTONIX) 40 MG tablet Take 1 tablet (40 mg total) by mouth 2 (two) times daily before a meal. 11/05/13  Yes Venetia Maxon Rama, MD  Nutritional Supplements (ENSURE COMPLETE PO) Take 237 mLs by mouth daily. Takes a little at a time, in order to keep it down.    Historical Provider, MD    Allergies:  No Known Allergies  Social History:  reports that she has quit smoking. She has never used smokeless tobacco. She reports that she does not drink alcohol or use illicit drugs.  Family History: Family History  Problem Relation Age of Onset  . Hypertension Mother   . Diabetes Mellitus II Mother   . Hypertension Sister   . Hypertension Brother   . Heart failure Maternal Grandmother   . Hypertension Maternal Grandfather   . Prostate cancer  Paternal Uncle     living at 53  . Gastric cancer Cousin 54    pat first cousin; daughter of pat uncle living with prostate ca   . Gastric cancer Cousin     mat first cousin; daughter of mat aunt who died in 57s due to heart issues  . Prostate cancer Paternal Uncle     deceased  . Prostate cancer Paternal Uncle     deceased  . Lung cancer Paternal Uncle     smoker; deceased    Physical Exam: Filed Vitals:   12/12/13 1139 12/12/13 1530 12/12/13 1537  BP: 117/75 130/83   Pulse: 108 101   Temp: 98.1 F (36.7 C)    TempSrc: Oral    Resp: 18  16  SpO2: 100% 100%    General appearance: alert, cooperative and no distress Head: Normocephalic, without obvious abnormality, atraumatic Eyes: negative Nose: Nares normal. Septum midline. Mucosa normal. No drainage or sinus tenderness. Neck: no JVD and supple, symmetrical, trachea midline Lungs: clear to auscultation bilaterally Heart: regular rate and rhythm, S1, S2 normal, no murmur, click, rub or gallop Abdomen: soft, non-tender; bowel sounds normal; no masses,  no organomegaly peg with thick drainage around site, no erythema Extremities: extremities normal, atraumatic, no cyanosis or edema Pulses: 2+ and symmetric Skin: Skin color, texture, turgor normal. No rashes or lesions Neurologic: Grossly normal    Labs on Admission:   Recent Labs  12/10/13 1059 12/12/13 1522 12/12/13 1824  NA 138 121* 136*  K 4.3 3.8 3.5*  CL  --  85* 99  CO2 28 24  --   GLUCOSE 134 114* 119*  BUN 13.4 11 11   CREATININE 0.9 0.53 0.50  CALCIUM 8.8 7.6*  --     Recent Labs  12/10/13 1059  AST 16  ALT 12  ALKPHOS 54  BILITOT 1.29*  PROT 6.1*  ALBUMIN 3.2*    Recent Labs  12/10/13 1059 12/12/13 1522 12/12/13 1824  WBC 1.2* 1.2*  --   NEUTROABS 0.2* 0.1*  --   HGB 8.4* 7.1* 7.8*  HCT 26.5* 21.0* 23.0*  MCV 77.0* 75.0*  --   PLT 142* 120*  --    Radiological Exams on Admission: Dg Abd 1 View  12/12/2013   CLINICAL DATA:   Leaking gastrostomy tube.  EXAM: ABDOMEN - 1 VIEW  COMPARISON:  PET scan of November 09, 2013.  FINDINGS: The gastrostomy tube appears to have its distal tip within the small bowel. Normal contrast filling of small bowel is noted. No extravasation or leakage is noted.  IMPRESSION: Distal tip of gastrostomy tube appears to be well positioned within small bowel. No extravasation or leakage is noted.   Electronically Signed   By: Sabino Dick M.D.   On: 12/12/2013 14:32  Ct Abdomen Pelvis W Contrast  12/12/2013   CLINICAL DATA:  Pain and drainage associated with a feeding tube placed 1 month ago. Foul smell from the tube for the past week. Status post distal gastrectomy and gastrojejunostomy for gastric cancer.  EXAM: CT ABDOMEN AND PELVIS WITH CONTRAST  TECHNIQUE: Multidetector CT imaging of the abdomen and pelvis was performed using the standard protocol following bolus administration of intravenous contrast.  CONTRAST:  177mL OMNIPAQUE IOHEXOL 300 MG/ML  SOLN  COMPARISON:  PET-CT dated 11/09/2013, single view abdomen obtained earlier today and abdomen and pelvis CT dated 11/01/2013.  FINDINGS: Again demonstrated is a left anterior percutaneous gastrojejunostomy tube with the tubing coiled within the gastric remnant and tip of the tube in the proximal jejunum. A common duct stent is unchanged. No biliary ductal dilatation. The 1.1 x 1.0 cm rounded low-density mass seen in the lateral segment of the left lobe of the liver on 11/01/2013 currently measures 0.9 x 0.9 cm on image number 11 of series 2. On the delayed images through the kidneys, this is smaller with peripheral contrast enhancement isodense to the liver.  No fluid collections or free peritoneal air. The spleen, pancreas, gallbladder, adrenal glands, left kidney, urinary bladder, uterus and left ovary are unremarkable. The right ovary is not visualized. The patient has had an oophorectomy by history. Stable small right renal probable cyst. No  intestinal abnormalities or enlarged lymph nodes. Normal appearing appendix. Unremarkable bones. Clear lung bases.  IMPRESSION: 1. The percutaneous gastrojejunostomy tube is unchanged with no fluid collections or free peritoneal air. 2. Stable biliary stent. 3. Stable lateral segment left lobe liver mass with no hypermetabolic activity on the recent PET-CT. This was not present on 09/11/2013, raising the possibility of an interval low metabolic activity metastasis. This could be further evaluated with pre and postcontrast magnetic resonance imaging of the liver if clinically indicated.   Electronically Signed   By: Enrique Sack M.D.   On: 12/12/2013 20:00   Assessment/Plan  48 yo female with h/o gastric cancer with leaking PEG tube and n/v  Principal Problem:   Intractable nausea and vomiting-  This seems to be improved and pt wishes to eat.  abd exam is benign.  Let have full liq diet then npo after midnight until peg has been evaluated.  Active Problems:  Stable unless o/w noted   PTSD (post-traumatic stress disorder)   Gastric carcinoma s/p Distal gastrectomy/GJ ([place]roth II) 09/20/2013   Protein-calorie malnutrition, severe   PEG tube malfunction- Will need to call GI in am concerning her peg tube.   Hyponatremia-  Improved already, cont ivf overnight   Hypokalemia-  Improved already, cont ivf overnight  Full code.  obs on medical.    Cesar Alf A 12/12/2013, 8:25 PM

## 2013-12-12 NOTE — ED Notes (Signed)
MD at bedside. 

## 2013-12-12 NOTE — ED Notes (Signed)
Pt in xray

## 2013-12-12 NOTE — ED Provider Notes (Signed)
CSN: [id number]     Arrival date & time 12/12/13  1117 History   First MD Initiated Contact with Patient 12/12/13 1243     Chief Complaint  Patient presents with  . Feeding tube leaking      (Consider location/radiation/quality/duration/timing/severity/associated sxs/prior Treatment) HPI Comments: The patient is a 48 year old female past history of stomach cancer, currently on chemotherapy, presenting to the emergency room with chief complaint of feeding tube problem. The patient reports increase in purulent discharge and pain at the insertion site for one week. Patient reports worsening pain in malodorous discharge. Patient reports diarrhea, loose stool, nonbloody, over the past 2 days. She reports one episode of vomiting yesterday. Patient denies fever, chills. Patient was able to flush the G-tube being yesterday without issue. G J.-tube placed 09/20/2013 by Dr. [name]. Patient denies melanotic stools, frank gross red blood, hematuria, hematemesis.  The history is provided by the patient. No language interpreter was used.    Past Medical History  Diagnosis Date  . Headache(784.0) 2011    Migraines  . Esophageal reflux     On omeprazole  . Costochondritis   . Hypertension     started around 2011  . Ovarian cyst   . Stomach cancer   . Family history of cancer   . Heart murmur    Past Surgical History  Procedure Laterality Date  . Oophorectomy      Around 1985 left ovary removal  . Tonsillectomy      Around 1994  . Tubal ligation    . Esophagogastroduodenoscopy N/A 09/16/2013    Procedure: ESOPHAGOGASTRODUODENOSCOPY (EGD);  Surgeon: Wonda Horner, MD;  Location: [facility];  Service: Endoscopy;  Laterality: N/A;  . Laparoscopy N/A 09/20/2013    Procedure: DIAGNOSTIC LAPAROSCOPY,DISTAL GASTRECTOMY AND FEEDING GASTROJEJUNOSTOMY;  Surgeon: [name], MD;  Location: [facility];  Service: General;  Laterality: N/A;  . Portacath placement Left 11/18/2013    Procedure: INSERTION  PORT-A-CATH;  Surgeon: [name], MD;  Location: [facility];  Service: General;  Laterality: Left;   Family History  Problem Relation Age of Onset  . Hypertension Mother   . Diabetes Mellitus II Mother   . Hypertension Sister   . Hypertension Brother   . Heart failure Maternal Grandmother   . Hypertension Maternal Grandfather   . Prostate cancer Paternal Uncle     living at 60  . Gastric cancer Cousin 107    pat first cousin; daughter of pat uncle living with prostate ca   . Gastric cancer Cousin     mat first cousin; daughter of mat aunt who died in 38s due to heart issues  . Prostate cancer Paternal Uncle     deceased  . Prostate cancer Paternal Uncle     deceased  . Lung cancer Paternal Uncle     smoker; deceased   History  Substance Use Topics  . Smoking status: Former Research scientist (life sciences)  . Smokeless tobacco: Never Used     Comment: Stopped 2004  . Alcohol Use: No   OB History   Grav Para Term Preterm Abortions TAB SAB Ect Mult Living                 Review of Systems  Constitutional: Positive for fatigue. Negative for fever and chills.  Gastrointestinal: Positive for nausea, vomiting, abdominal pain and diarrhea. Negative for constipation, blood in stool and anal bleeding.  Genitourinary: Negative for dysuria.      Allergies  Review of patient's allergies indicates no known  allergies.  Home Medications   Prior to Admission medications   Medication Sig Start Date End Date Taking? Authorizing Provider  carvedilol (COREG) 6.25 MG tablet Take 6.25 mg by mouth 2 (two) times daily with a meal.    Historical Provider, MD  fentaNYL (DURAGESIC - DOSED MCG/HR) 25 MCG/HR patch Place 1 patch (25 mcg total) onto the skin every 3 (three) days. 12/02/13   Curt Bears, MD  HYDROmorphone HCl (DILAUDID) 1 MG/ML LIQD Place 1 mL (1 mg total) into feeding tube every 4 (four) hours as needed for severe pain. 12/02/13   Curt Bears, MD  lidocaine (LIDODERM) 5 % Place 1 patch onto the  skin daily as needed. pain 10/17/13   Historical Provider, MD  lidocaine-prilocaine (EMLA) cream Apply 1 application topically as needed. Apply to port 1 hour before chemo 11/13/13   Curt Bears, MD  LORazepam (ATIVAN) 1 MG tablet Take 1 mg by mouth 3 (three) times daily.    Historical Provider, MD  metoCLOPramide (REGLAN) 10 MG tablet Take 1 tablet (10 mg total) by mouth 4 (four) times daily. 11/05/13   Venetia Maxon Rama, MD  Multiple Vitamins-Minerals (MULTIVITAMIN & MINERAL PO) Take 1 tablet by mouth daily.    Historical Provider, MD  Nutritional Supplements (ENSURE COMPLETE PO) Take 237 mLs by mouth daily.    Historical Provider, MD  OLANZapine zydis (ZYPREXA) 5 MG disintegrating tablet Take 1 tablet (5 mg total) by mouth at bedtime. 11/05/13   Venetia Maxon Rama, MD  ondansetron (ZOFRAN-ODT) 8 MG disintegrating tablet Take 1 tablet (8 mg total) by mouth every 8 (eight) hours as needed for nausea or vomiting. 11/19/13   Curt Bears, MD  pantoprazole (PROTONIX) 40 MG tablet Take 1 tablet (40 mg total) by mouth 2 (two) times daily before a meal. 11/05/13   Christina P Rama, MD   BP 117/75  Pulse 108  Temp(Src) 98.1 F (36.7 C) (Oral)  Resp 18  SpO2 100%  LMP 08/21/2013 Physical Exam  Nursing note and vitals reviewed. Constitutional: She is oriented to person, place, and time. She appears well-developed and well-nourished. No distress.  HENT:  Head: Normocephalic and atraumatic.  Eyes: No scleral icterus.  Neck: Neck supple.  Cardiovascular: Regular rhythm.  Tachycardia present.   Pulmonary/Chest: No respiratory distress. She has no decreased breath sounds. She has no wheezes. She has no rhonchi. She has no rales.  Left Upper chest wall: Port site, healing scar, Non-tender.  Abdominal: Soft. There is generalized tenderness. There is guarding. There is no rebound.  Left abdomen: feeding tub in place. Minimal purulent discharge, Gastric tube with brownish liquid in tube, no blood, abdomen  severely tenderness to palpation with voluntary guarding.  Genitourinary:  Minimal tan stool in rectal vault, no gross blood. Chaperone present.   Neurological: She is alert and oriented to person, place, and time.  Skin: Skin is warm and dry. No rash noted. She is not diaphoretic.  Psychiatric: She has a normal mood and affect. Her behavior is normal.    ED Course  Procedures (including critical care time) Labs Review Labs Reviewed - No data to display .edthisImaging Review No results found.     DG Abd 1 View (Final result)  Result time: 12/12/13 14:32:51    Final result by Rad Results In Interface (12/12/13 14:32:51)    Narrative:   CLINICAL DATA: Leaking gastrostomy tube.  EXAM: ABDOMEN - 1 VIEW  COMPARISON: PET scan of November 09, 2013.  FINDINGS: The gastrostomy tube appears to  have its distal tip within the small bowel. Normal contrast filling of small bowel is noted. No extravasation or leakage is noted.  IMPRESSION: Distal tip of gastrostomy tube appears to be well positioned within small bowel. No extravasation or leakage is noted.   Electronically Signed By: Sabino Dick M.D. On: 12/12/2013 14:32           EKG Interpretation None      MDM   Final diagnoses:  Encounter for gastrojejunal (GJ) tube placement  Hyponatremia  Neutropenia  Intractable vomiting with nausea, vomiting of unspecified type  Anemia, unspecified anemia type  Gastric carcinoma   Patient with known history of gastric cancer presents with generalized abdominal discomfort out of proportion on exam and complaints of drainage from [place] tube. Labs ordered, pain medication ordered, x-ray to evaluate tube placement ordered. X-ray shows correct positioning of gastric tube, no leakage. IV team paged, to access port, delayed care for 2.5 hours due to waiting on IV team. Dr. Wilson Singer also evaluated the patient, advised CT abdomen pelvis. Reevaluation patient reports episode of diarrhea in  ED, no frank blood. Computer system down, unexpectedly, for a prolonged period of time, unable to access lab results, records or studies. Na 121, fluids given. Repeat ordered. CBC shows WBC 1.2, anemia with a Hbg 7.1, downtrending from 8.42 days ago, Hemoccult negative denies hematemesis, hematuria, gross red blood, melanotic stool. 1750 Pt care assumed by [place] at shift change, awaiting repeat Na check, CT,  pre-transfusion labs. Plan to admit for hyponatremia,anemia, abdominal pain.  Harvie Heck, PA-C 12/14/13 [phone]

## 2013-12-12 NOTE — ED Notes (Signed)
Attempted to call report, RN unavailable at this time. Floor to call back. 

## 2013-12-12 NOTE — ED Notes (Signed)
Paged iv team for port access

## 2013-12-13 ENCOUNTER — Observation Stay (HOSPITAL_COMMUNITY): Payer: No Typology Code available for payment source

## 2013-12-13 ENCOUNTER — Ambulatory Visit: Payer: No Typology Code available for payment source

## 2013-12-13 ENCOUNTER — Encounter (HOSPITAL_COMMUNITY): Payer: Self-pay | Admitting: *Deleted

## 2013-12-13 DIAGNOSIS — Z79899 Other long term (current) drug therapy: Secondary | ICD-10-CM | POA: Diagnosis not present

## 2013-12-13 DIAGNOSIS — D649 Anemia, unspecified: Secondary | ICD-10-CM

## 2013-12-13 DIAGNOSIS — R1110 Vomiting, unspecified: Secondary | ICD-10-CM

## 2013-12-13 DIAGNOSIS — E43 Unspecified severe protein-calorie malnutrition: Secondary | ICD-10-CM | POA: Diagnosis present

## 2013-12-13 DIAGNOSIS — E871 Hypo-osmolality and hyponatremia: Secondary | ICD-10-CM

## 2013-12-13 DIAGNOSIS — Y833 Surgical operation with formation of external stoma as the cause of abnormal reaction of the patient, or of later complication, without mention of misadventure at the time of the procedure: Secondary | ICD-10-CM | POA: Diagnosis present

## 2013-12-13 DIAGNOSIS — D61818 Other pancytopenia: Secondary | ICD-10-CM | POA: Diagnosis present

## 2013-12-13 DIAGNOSIS — K219 Gastro-esophageal reflux disease without esophagitis: Secondary | ICD-10-CM | POA: Diagnosis present

## 2013-12-13 DIAGNOSIS — Z87898 Personal history of other specified conditions: Secondary | ICD-10-CM

## 2013-12-13 DIAGNOSIS — C169 Malignant neoplasm of stomach, unspecified: Secondary | ICD-10-CM | POA: Diagnosis present

## 2013-12-13 DIAGNOSIS — K9423 Gastrostomy malfunction: Secondary | ICD-10-CM | POA: Diagnosis present

## 2013-12-13 DIAGNOSIS — Z23 Encounter for immunization: Secondary | ICD-10-CM | POA: Diagnosis not present

## 2013-12-13 DIAGNOSIS — R112 Nausea with vomiting, unspecified: Secondary | ICD-10-CM | POA: Diagnosis present

## 2013-12-13 DIAGNOSIS — F4310 Post-traumatic stress disorder, unspecified: Secondary | ICD-10-CM | POA: Diagnosis present

## 2013-12-13 DIAGNOSIS — I10 Essential (primary) hypertension: Secondary | ICD-10-CM | POA: Diagnosis present

## 2013-12-13 DIAGNOSIS — Z87891 Personal history of nicotine dependence: Secondary | ICD-10-CM | POA: Diagnosis not present

## 2013-12-13 DIAGNOSIS — E876 Hypokalemia: Secondary | ICD-10-CM | POA: Diagnosis present

## 2013-12-13 DIAGNOSIS — D61810 Antineoplastic chemotherapy induced pancytopenia: Secondary | ICD-10-CM | POA: Diagnosis present

## 2013-12-13 DIAGNOSIS — Z6823 Body mass index (BMI) 23.0-23.9, adult: Secondary | ICD-10-CM | POA: Diagnosis not present

## 2013-12-13 LAB — CBC
HEMATOCRIT: 19.9 % — AB (ref 36.0–46.0)
HEMOGLOBIN: 6.5 g/dL — AB (ref 12.0–15.0)
MCH: 25.2 pg — ABNORMAL LOW (ref 26.0–34.0)
MCHC: 32.7 g/dL (ref 30.0–36.0)
MCV: 77.1 fL — AB (ref 78.0–100.0)
Platelets: 103 10*3/uL — ABNORMAL LOW (ref 150–400)
RBC: 2.58 MIL/uL — ABNORMAL LOW (ref 3.87–5.11)
RDW: 16.9 % — ABNORMAL HIGH (ref 11.5–15.5)
WBC: 1.4 10*3/uL — AB (ref 4.0–10.5)

## 2013-12-13 LAB — BASIC METABOLIC PANEL
Anion gap: 11 (ref 5–15)
BUN: 9 mg/dL (ref 6–23)
CO2: 23 mEq/L (ref 19–32)
CREATININE: 0.73 mg/dL (ref 0.50–1.10)
Calcium: 7.9 mg/dL — ABNORMAL LOW (ref 8.4–10.5)
Chloride: 103 mEq/L (ref 96–112)
GFR calc Af Amer: >90 mL/min (ref >90)
GFR calc non Af Amer: >90 mL/min (ref >90)
Glucose, Bld: 106 mg/dL — ABNORMAL HIGH (ref 70–99)
Potassium: 3.9 mEq/L (ref 3.7–5.3)
Sodium: 137 mEq/L (ref 137–147)

## 2013-12-13 LAB — PROTIME-INR
INR: 1.68 — ABNORMAL HIGH (ref 0.00–1.49)
Prothrombin Time: 19.9 seconds — ABNORMAL HIGH (ref 11.6–15.2)

## 2013-12-13 LAB — PREPARE RBC (CROSSMATCH)

## 2013-12-13 MED ORDER — SODIUM CHLORIDE 0.9 % IV SOLN
INTRAVENOUS | Status: DC
  Administered 2013-12-14: via INTRAVENOUS

## 2013-12-13 MED ORDER — CETYLPYRIDINIUM CHLORIDE 0.05 % MT LIQD
7.0000 mL | Freq: Two times a day (BID) | OROMUCOSAL | Status: DC
  Administered 2013-12-13 – 2013-12-14 (×3): 7 mL via OROMUCOSAL

## 2013-12-13 MED ORDER — HEPARIN SOD (PORK) LOCK FLUSH 100 UNIT/ML IV SOLN
500.0000 [IU] | INTRAVENOUS | Status: AC | PRN
  Administered 2013-12-14: 500 [IU]
  Filled 2013-12-13: qty 5

## 2013-12-13 MED ORDER — ACETAMINOPHEN 325 MG PO TABS
650.0000 mg | ORAL_TABLET | Freq: Four times a day (QID) | ORAL | Status: DC | PRN
  Administered 2013-12-13 – 2013-12-14 (×3): 650 mg via ORAL
  Filled 2013-12-13 (×3): qty 2

## 2013-12-13 MED ORDER — ENSURE COMPLETE PO LIQD
120.0000 mL | Freq: Four times a day (QID) | ORAL | Status: DC
  Administered 2013-12-13 – 2013-12-14 (×4): 120 mL via ORAL

## 2013-12-13 MED ORDER — HYDROMORPHONE HCL 1 MG/ML PO LIQD
1.0000 mg | ORAL | Status: DC | PRN
  Administered 2013-12-14: 1 mg
  Administered 2013-12-14: 0.5 mg
  Filled 2013-12-13 (×2): qty 1

## 2013-12-13 MED ORDER — SODIUM CHLORIDE 0.9 % IV SOLN
Freq: Once | INTRAVENOUS | Status: AC
  Administered 2013-12-13: 10:00:00 via INTRAVENOUS

## 2013-12-13 MED ORDER — IOHEXOL 300 MG/ML  SOLN
50.0000 mL | Freq: Once | INTRAMUSCULAR | Status: AC | PRN
  Administered 2013-12-13: 50 mL via ORAL

## 2013-12-13 MED ORDER — CHLORHEXIDINE GLUCONATE 0.12 % MT SOLN
15.0000 mL | Freq: Two times a day (BID) | OROMUCOSAL | Status: DC
  Administered 2013-12-13 – 2013-12-14 (×3): 15 mL via OROMUCOSAL
  Filled 2013-12-13 (×6): qty 15

## 2013-12-13 NOTE — ED Provider Notes (Signed)
Medical screening examination/treatment/procedure(s) were performed by non-physician practitioner and as supervising physician I was immediately available for consultation/collaboration.   EKG Interpretation None        Mariea Clonts, MD 12/13/13 [phone]

## 2013-12-13 NOTE — Care Management Note (Signed)
CARE MANAGEMENT NOTE 12/13/2013  Patient:  Paula [name], Paula [name]   Account Number:  [id number]  Date Initiated:  12/13/2013  Documentation initiated by:  Marney Doctor  Subjective/Objective Assessment:   48 yo admitted with intractable nausea and vomitting.  Hx of gastric CA and peg tube placement.     Action/Plan:   Lives home alone   Anticipated DC Date:  12/15/2013   Anticipated DC Plan:  HOME/SELF CARE         Choice offered to / List presented to:             Status of service:  In process, will continue to follow Medicare Important Message given?   (If response is "NO", the following Medicare IM given date fields will be blank) Date Medicare IM given:   Medicare IM given by:   Date Additional Medicare IM given:   Additional Medicare IM given by:    Discharge Disposition:    Per UR Regulation:  Reviewed for med. necessity/level of care/duration of stay  If discussed at [place] of Stay Meetings, dates discussed:    Comments:  12/13/13 Marney Doctor RN,BSN,NCM [phone] Chart reviewed and CM following for DC needs.

## 2013-12-13 NOTE — Progress Notes (Addendum)
Patient supposed to be discharged to home but patient and family request to hold discharge until tomorrow morning due to patient drowsiness and patient safety concerns. Notified MD but call has not been returned. Will continue to monitor and pass to oncoming RN.

## 2013-12-13 NOTE — Progress Notes (Deleted)
UR completed 
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
## 2013-12-13 NOTE — Progress Notes (Signed)
Patient interviewed and examined, agree with NP note above. Patient is very somnolent as I am seeing her this afternoon and can't give much history. Currently no leaking from her gastric tube. Imaging shows the jejunal portion curled in the stomach. This would not cause harm but if she has poor gastric emptying could cause the tube feeding to back up and leaks. Possibly the jejunal portion could be replaced distally into the small bowel either by interventional radiology or by endoscopy. She does not appear to have any acute abdominal problems  Edward Jolly MD, FACS  12/13/2013 3:54 PM

## 2013-12-13 NOTE — Progress Notes (Signed)
RN paged about temp of 100.4, pt extremely lethargic, and family concerned about change from baseline.  Pt complains of abd pain around GJ tube, and complains of diarrhea   VS- Temp 100.5, tachycardic HR 113, other VSS General-Chronically sick looking and frail.  NAD.  Alert only to self.  HEENT- dry oral mucosa Lungs- clear to auscultate bilaterally. No ronchi, rales, or wheeze Heart- tachycardic, normal rhythm. No murmurs. Abdomen- normal BS, nondistended, tender to palpation around G-tube site, with foul odor, no erythema, drainage, or area of fluctuance.   Neuro- no focal deficits. Symmetrical  bilat upper and lower extremity strength.    Abd Xray- Gastrojejunostomy tube appears to be coiled within the stomach.  Ordered CBC, BMET, Cdiff, and Blood cultures.  Restarted NS at 161ml/hr. Placed IR order to have GJ tube reassessed and coiling fixed.  Lacy Duverney [facility]

## 2013-12-13 NOTE — Progress Notes (Signed)
INITIAL NUTRITION ASSESSMENT  Patient continues to meet criteria for severe malnutrition related to chronic illness AEB 20% weight loss in the past 4 months, intake of <75% estimated needs for > 1 month and muscle wasting and subcutaneous fat loss.  DOCUMENTATION CODES Per approved criteria  -Severe malnutrition in the context of chronic illness   INTERVENTION:  Regular diet as tolerated Ensure Complete po 4 oz 4 times daily, each supplement provides 350 kcal and 13 grams of protein Will begin calorie count and f/u Monday   NUTRITION DIAGNOSIS: Inadequate oral intake related to chronic illness as evidenced by patient report.   Goal: Patient to meet >90% estimated needs with meals/snacks and supplements  Monitor:  Intake, diet tolerance, labs, weight trend  Reason for Assessment: MST  48 y.o. female  Admitting Dx: Intractable nausea and vomiting  ASSESSMENT: Patient admitted with N/V.  Hx of gastric cancer s/p G-J tube placement 10/28/13 due to malnutrition and need for supplementation.  Patient never tolerated past 30 ml/hr last admit and was not using this at home.  Intake prior to admit consisted of a lot of fruit, yogurt and 4 oz Ensure Plus bid.  Intake overall has been inadequate.  Patient wants feeding tube removed.  Patient difficult to communicate with as she falls asleep easily and has has lack of focus.  Was trying to order lunch on my visit.    Patient continues to meet criteria for severe malnutrition related to chronic illness AEB 20% weight loss in the past 4 months, intake of <75% estimated needs for > 1 month and muscle wasting and subcutaneous fat loss.  Height: Ht Readings from Last 1 Encounters:  12/12/13 5\' 2"  (1.575 m)    Weight: Wt Readings from Last 1 Encounters:  12/12/13 126 lb 4.8 oz (57.289 kg)    Ideal Body Weight: 110 lbs  % Ideal Body Weight: 115  Wt Readings from Last 10 Encounters:  12/12/13 126 lb 4.8 oz (57.289 kg)  11/26/13 127 lb  12.8 oz (57.97 kg)  11/23/13 130 lb (58.968 kg)  11/18/13 131 lb (59.421 kg)  11/18/13 131 lb (59.421 kg)  11/11/13 128 lb (58.06 kg)  11/06/13 137 lb 4.8 oz (62.279 kg)  11/06/13 136 lb 9.6 oz (61.961 kg)  11/05/13 135 lb 11.2 oz (61.553 kg)  10/20/13 138 lb (62.596 kg)    Usual Body Weight: 165-170 lbs.  157 lbs 4 months ago  % Usual Body Weight: 76% of usual weight, 80% of weight 4 months ago.  BMI:  Body mass index is 23.09 kg/(m^2).  Estimated Nutritional Needs: Kcal: 1800-1900  Protein: 75-90 gm  Fluid: >1.8 L daily  Skin: intact  Diet Order: General  EDUCATION NEEDS: -Education needs addressed   Intake/Output Summary (Last 24 hours) at 12/13/13 1336 Last data filed at 12/13/13 1315  Gross per 24 hour  Intake  831.1 ml  Output    300 ml  Net  531.1 ml     Labs:   Recent Labs Lab 12/10/13 1059 12/12/13 1522 12/12/13 1824 12/13/13 0537  NA 138 121* 136* 137  K 4.3 3.8 3.5* 3.9  CL  --  85* 99 103  CO2 28 24  --  23  BUN 13.4 11 11 9   CREATININE 0.9 0.53 0.50 0.73  CALCIUM 8.8 7.6*  --  7.9*  GLUCOSE 134 114* 119* 106*    CBG (last 3)  No results found for this basename: GLUCAP,  in the last 72 hours  Scheduled  Meds: . antiseptic oral rinse  7 mL Mouth Rinse q12n4p  . carvedilol  6.25 mg Oral BID WC  . chlorhexidine  15 mL Mouth Rinse BID  . fentaNYL  25 mcg Transdermal Q72H  . LORazepam  1 mg Oral TID  . metoCLOPramide  10 mg Oral QID  . OLANZapine zydis  5 mg Oral QHS  . pantoprazole  40 mg Oral BID AC    Continuous Infusions:   Past Medical History  Diagnosis Date  . Headache(784.0) 2011    Migraines  . Esophageal reflux     On omeprazole  . Costochondritis   . Hypertension     started around 2011  . Ovarian cyst   . Stomach cancer   . Family history of cancer   . Heart murmur     Past Surgical History  Procedure Laterality Date  . Oophorectomy      Around 1985 left ovary removal  . Tonsillectomy      Around 1994  .  Tubal ligation    . Esophagogastroduodenoscopy N/A 09/16/2013    Procedure: ESOPHAGOGASTRODUODENOSCOPY (EGD);  Surgeon: Wonda Horner, MD;  Location: [facility];  Service: Endoscopy;  Laterality: N/A;  . Laparoscopy N/A 09/20/2013    Procedure: DIAGNOSTIC LAPAROSCOPY,DISTAL GASTRECTOMY AND FEEDING GASTROJEJUNOSTOMY;  Surgeon: [name], MD;  Location: [facility];  Service: General;  Laterality: N/A;  . Portacath placement Left 11/18/2013    Procedure: INSERTION PORT-A-CATH;  Surgeon: [name], MD;  Location: [facility];  Service: General;  Laterality: Left;    Antonieta Iba, RD, LDN Clinical Inpatient Dietitian Pager:  [phone] Weekend and after hours pager:  [phone]

## 2013-12-13 NOTE — Progress Notes (Signed)
Patient ID: Paula [name], female   DOB: Feb 12, 1966, 48 y.o.   MRN: [id number]     [facility]      [address] [place], [place] [address]    Phone: [phone] FAX: [phone]     Subjective: The patient is well known to our service from previous admissions.  She presents with 1 week of leaking around her GJ tube site.  She denies foul smell.  Denies fever or chills.  She reports n/v yesterday, which has now resolved.  This is actually improvement for the patient.  She is no longer using the GJ tube for feedings and is taking in PO.    Objective:  Vital signs:  Filed Vitals:   12/12/13 1537 12/12/13 2045 12/12/13 2150 12/13/13 0550  BP:  136/72 144/83 110/63  Pulse:  82 103 116  Temp:  98 F (36.7 C) 97.7 F (36.5 C) 99.3 F (37.4 C)  TempSrc:  Oral Oral Oral  Resp: 16 18 16 16   Height:   5' 2"  (1.575 m)   Weight:   126 lb 4.8 oz (57.289 kg)   SpO2:  100% 100% 100%    Last BM Date: 12/12/13  Intake/Output   Yesterday:  10/22 0701 - 10/23 0700 In: 483.6 [I.V.:483.6] Out: 300 [Urine:300] This shift:    I/O last 3 completed shifts: In: 483.6 [I.V.:483.6] Out: 300 [Urine:300]    Physical Exam: General: Pt awake/alert/oriented x4 in no acute distress Abdomen: Soft.  Nondistended.  ttp around the [place] tube site, no erythema or drainage, no areas of fluctuance.  No evidence of peritonitis.  No incarcerated hernias.    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   Protein-calorie malnutrition, severe   PEG tube malfunction   Hyponatremia   Hypokalemia    Results:   Labs: Results for orders placed during the hospital encounter of 12/12/13 (from the past 48 hour(s))  CBC WITH DIFFERENTIAL     Status: Abnormal   Collection Time    12/12/13  3:22 PM       Result Value Ref Range   WBC 1.2 (*) 4.0 - 10.5 K/uL   Comment: CRITICAL RESULT CALLED TO, READ BACK BY AND VERIFIED WITH:     L MCCLAIRE RN 1649 12/12/13 A NAVARRO   RBC 2.80 (*) 3.87 - 5.11 MIL/uL   Hemoglobin 7.1 (*) 12.0 - 15.0 g/dL   HCT 21.0 (*) 36.0 - 46.0 %   MCV 75.0 (*) 78.0 - 100.0 fL   MCH 25.4 (*) 26.0 - 34.0 pg   MCHC 33.8  30.0 - 36.0 g/dL   RDW 16.6 (*) 11.5 - 15.5 %   Platelets 120 (*) 150 - 400 K/uL   Neutrophils Relative % 5 (*) 43 - 77 %   Neutro Abs 0.1 (*) 1.7 - 7.7 K/uL   Lymphocytes Relative 75 (*) 12 - 46 %   Lymphs Abs 0.9  0.7 - 4.0 K/uL   Monocytes Relative 16 (*) 3 - 12 %   Monocytes Absolute 0.2  0.1 - 1.0 K/uL   Eosinophils Relative 5  0 - 5 %   Eosinophils Absolute 0.1  0.0 - 0.7 K/uL   Basophils Relative 0  0 - 1 %   Basophils Absolute 0.0  0.0 - 0.1 K/uL   RBC Morphology POLYCHROMASIA PRESENT  BASIC METABOLIC PANEL     Status: Abnormal   Collection Time    12/12/13  3:22 PM      Result Value Ref Range   Sodium 121 (*) 137 - 147 mEq/L   Comment: CRITICAL RESULT CALLED TO, READ BACK BY AND VERIFIED WITH:     BLAKELY,E. RN @ 1619 ON [id number] BY MCCOY,N   Potassium 3.8  3.7 - 5.3 mEq/L   Chloride 85 (*) 96 - 112 mEq/L   CO2 24  19 - 32 mEq/L   Glucose, Bld 114 (*) 70 - 99 mg/dL   BUN 11  6 - 23 mg/dL   Creatinine, Ser 0.53  0.50 - 1.10 mg/dL   Calcium 7.6 (*) 8.4 - 10.5 mg/dL   GFR calc non Af Amer >90  >90 mL/min   GFR calc Af Amer >90  >90 mL/min   Comment: (NOTE)     The eGFR has been calculated using the CKD EPI equation.     This calculation has not been validated in all clinical situations.     eGFR's persistently <90 mL/min signify possible Chronic Kidney     Disease.   Anion gap 12  5 - 15  POC OCCULT BLOOD, ED     Status: None   Collection Time    12/12/13  5:26 PM      Result Value Ref Range   Fecal Occult Bld NEGATIVE  NEGATIVE  TYPE AND SCREEN     Status: None   Collection Time    12/12/13  6:05 PM      Result Value Ref  Range   ABO/RH(D) B POS     Antibody Screen NEG     Sample Expiration 12/15/2013     Unit Number [id number]     Blood Component Type RED CELLS,LR     Unit division 00     Status of Unit ALLOCATED     Transfusion Status OK TO TRANSFUSE     Crossmatch Result Compatible     Unit Number [id number]     Blood Component Type RED CELLS,LR     Unit division 00     Status of Unit ALLOCATED     Transfusion Status OK TO TRANSFUSE     Crossmatch Result Compatible    I-STAT CHEM 8, ED     Status: Abnormal   Collection Time    12/12/13  6:24 PM      Result Value Ref Range   Sodium 136 (*) 137 - 147 mEq/L   Potassium 3.5 (*) 3.7 - 5.3 mEq/L   Chloride 99  96 - 112 mEq/L   BUN 11  6 - 23 mg/dL   Creatinine, Ser 0.50  0.50 - 1.10 mg/dL   Glucose, Bld 119 (*) 70 - 99 mg/dL   Calcium, Ion 1.15  1.12 - 1.23 mmol/L   TCO2 26  0 - 100 mmol/L   Hemoglobin 7.8 (*) 12.0 - 15.0 g/dL   HCT 23.0 (*) 36.0 - 46.0 %  ABO/RH     Status: None   Collection Time    12/12/13  6:28 PM      Result Value Ref Range   ABO/RH(D) B POS    URINALYSIS, ROUTINE W REFLEX MICROSCOPIC     Status: Abnormal   Collection Time    12/12/13  6:37 PM      Result Value Ref Range   Color, Urine AMBER (*) YELLOW   Comment: BIOCHEMICALS MAY BE AFFECTED BY COLOR  APPearance CLOUDY (*) CLEAR   Specific Gravity, Urine 1.035 (*) 1.005 - 1.030   pH 6.0  5.0 - 8.0   Glucose, UA NEGATIVE  NEGATIVE mg/dL   Hgb urine dipstick SMALL (*) NEGATIVE   Bilirubin Urine SMALL (*) NEGATIVE   Ketones, ur 40 (*) NEGATIVE mg/dL   Protein, ur 30 (*) NEGATIVE mg/dL   Urobilinogen, UA 0.2  0.0 - 1.0 mg/dL   Nitrite NEGATIVE  NEGATIVE   Leukocytes, UA NEGATIVE  NEGATIVE  URINE MICROSCOPIC-ADD ON     Status: Abnormal   Collection Time    12/12/13  6:37 PM      Result Value Ref Range   Squamous Epithelial / LPF RARE  RARE   WBC, UA 0-2  <3 WBC/hpf   RBC / HPF 3-6  <3 RBC/hpf   Bacteria, UA RARE  RARE   Casts HYALINE CASTS (*) NEGATIVE    Comment: GRANULAR CAST   Urine-Other MUCOUS PRESENT    POC URINE PREG, ED     Status: None   Collection Time    12/12/13  6:47 PM      Result Value Ref Range   Preg Test, Ur NEGATIVE  NEGATIVE   Comment:            THE SENSITIVITY OF THIS     METHODOLOGY IS >24 mIU/mL  POC URINE PREG, ED     Status: None   Collection Time    12/12/13  6:49 PM      Result Value Ref Range   Preg Test, Ur NEGATIVE  NEGATIVE   Comment:            THE SENSITIVITY OF THIS     METHODOLOGY IS >24 mIU/mL  CBC     Status: Abnormal   Collection Time    12/13/13  5:37 AM      Result Value Ref Range   WBC 1.4 (*) 4.0 - 10.5 K/uL   Comment: REPEATED TO VERIFY     CRITICAL RESULT CALLED TO, READ BACK BY AND VERIFIED WITH:     N. MAYFIELD RN AT 0600 ON 10.23.15 BY SHUEA   RBC 2.58 (*) 3.87 - 5.11 MIL/uL   Hemoglobin 6.5 (*) 12.0 - 15.0 g/dL   Comment: REPEATED TO VERIFY     CRITICAL RESULT CALLED TO, READ BACK BY AND VERIFIED WITH:     N. MAYFIELD RN AT 0600 ON 10.23.15 BY SHUEA   HCT 19.9 (*) 36.0 - 46.0 %   MCV 77.1 (*) 78.0 - 100.0 fL   MCH 25.2 (*) 26.0 - 34.0 pg   MCHC 32.7  30.0 - 36.0 g/dL   RDW 16.9 (*) 11.5 - 15.5 %   Platelets 103 (*) 150 - 400 K/uL   Comment: SPECIMEN CHECKED FOR CLOTS     CONSISTENT WITH PREVIOUS RESULT  BASIC METABOLIC PANEL     Status: Abnormal   Collection Time    12/13/13  5:37 AM      Result Value Ref Range   Sodium 137  137 - 147 mEq/L   Potassium 3.9  3.7 - 5.3 mEq/L   Chloride 103  96 - 112 mEq/L   CO2 23  19 - 32 mEq/L   Glucose, Bld 106 (*) 70 - 99 mg/dL   BUN 9  6 - 23 mg/dL   Creatinine, Ser 0.73  0.50 - 1.10 mg/dL   Calcium 7.9 (*) 8.4 - 10.5 mg/dL   GFR calc non Af Amer >90  >  90 mL/min   GFR calc Af Amer >90  >90 mL/min   Comment: (NOTE)     The eGFR has been calculated using the CKD EPI equation.     This calculation has not been validated in all clinical situations.     eGFR's persistently <90 mL/min signify possible Chronic Kidney     Disease.    Anion gap 11  5 - 15  PROTIME-INR     Status: Abnormal   Collection Time    12/13/13  5:37 AM      Result Value Ref Range   Prothrombin Time 19.9 (*) 11.6 - 15.2 seconds   INR 1.68 (*) 0.00 - 1.49  PREPARE RBC (CROSSMATCH)     Status: None   Collection Time    12/13/13  8:00 AM      Result Value Ref Range   Order Confirmation ORDER PROCESSED BY BLOOD BANK      Imaging / Studies: Dg Abd 1 View  12/12/2013   CLINICAL DATA:  Leaking gastrostomy tube.  EXAM: ABDOMEN - 1 VIEW  COMPARISON:  PET scan of November 09, 2013.  FINDINGS: The gastrostomy tube appears to have its distal tip within the small bowel. Normal contrast filling of small bowel is noted. No extravasation or leakage is noted.  IMPRESSION: Distal tip of gastrostomy tube appears to be well positioned within small bowel. No extravasation or leakage is noted.   Electronically Signed   By: Sabino Dick M.D.   On: 12/12/2013 14:32   Ct Abdomen Pelvis W Contrast  12/12/2013   CLINICAL DATA:  Pain and drainage associated with a feeding tube placed 1 month ago. Foul smell from the tube for the past week. Status post distal gastrectomy and gastrojejunostomy for gastric cancer.  EXAM: CT ABDOMEN AND PELVIS WITH CONTRAST  TECHNIQUE: Multidetector CT imaging of the abdomen and pelvis was performed using the standard protocol following bolus administration of intravenous contrast.  CONTRAST:  130m OMNIPAQUE IOHEXOL 300 MG/ML  SOLN  COMPARISON:  PET-CT dated 11/09/2013, single view abdomen obtained earlier today and abdomen and pelvis CT dated 11/01/2013.  FINDINGS: Again demonstrated is a left anterior percutaneous gastrojejunostomy tube with the tubing coiled within the gastric remnant and tip of the tube in the proximal jejunum. A common duct stent is unchanged. No biliary ductal dilatation. The 1.1 x 1.0 cm rounded low-density mass seen in the lateral segment of the left lobe of the liver on 11/01/2013 currently measures 0.9 x 0.9 cm on  image number 11 of series 2. On the delayed images through the kidneys, this is smaller with peripheral contrast enhancement isodense to the liver.  No fluid collections or free peritoneal air. The spleen, pancreas, gallbladder, adrenal glands, left kidney, urinary bladder, uterus and left ovary are unremarkable. The right ovary is not visualized. The patient has had an oophorectomy by history. Stable small right renal probable cyst. No intestinal abnormalities or enlarged lymph nodes. Normal appearing appendix. Unremarkable bones. Clear lung bases.  IMPRESSION: 1. The percutaneous gastrojejunostomy tube is unchanged with no fluid collections or free peritoneal air. 2. Stable biliary stent. 3. Stable lateral segment left lobe liver mass with no hypermetabolic activity on the recent PET-CT. This was not present on 09/11/2013, raising the possibility of an interval low metabolic activity metastasis. This could be further evaluated with pre and postcontrast magnetic resonance imaging of the liver if clinically indicated.   Electronically Signed   By: SEnrique SackM.D.   On: 12/12/2013 20:00  Medications / Allergies:  Scheduled Meds: . sodium chloride   Intravenous Once  . antiseptic oral rinse  7 mL Mouth Rinse q12n4p  . carvedilol  6.25 mg Oral BID WC  . chlorhexidine  15 mL Mouth Rinse BID  . fentaNYL  25 mcg Transdermal Q72H  . LORazepam  1 mg Oral TID  . metoCLOPramide  10 mg Oral QID  . OLANZapine zydis  5 mg Oral QHS  . pantoprazole  40 mg Oral BID AC   Continuous Infusions: . 0.9 % NaCl with KCl 20 mEq / L 75 mL/hr at 12/12/13 2223   PRN Meds:.HYDROmorphone (DILAUDID) injection, HYDROmorphone HCl, ondansetron (ZOFRAN) IV, ondansetron, ondansetron  Antibiotics: Anti-infectives   None        Assessment/Plan Stage II gastric adenocarcinoma dx 08/2013 S/p diagnostic laparoscopy, distal gastrectomy with Billroth II anastomosis and feeding GJ---Dr. Barry Dienes 09/20/13 Intractable nausea and  vomiting Presumed leaking around [place] tube  I do not see any evidence of infection or leaking around the tube site.  She does exhibit some abdominal tenderness without additional findings.  A CT of abdomen and pelvis did not show any fluid collections.  Will consider having IR inject the GJ to ensure placement, however, I am not sure whether this is even warranted.  I was able to flush both port sites without any difficulties and there was no leaking present following the flushing.  She is flushing the ports 2x per day.  She is eating by mouth and is not doing any TF through the [place].  Will discuss with Dr. Excell Seltzer whether further interventions are necessary.  She is concerned about her lack of appetite.  She has not lost any significant weight in the last several weeks.  I will leave this up to her primary team, perhaps can discuss with Dr. [name] whether any appetite stimulants may be beneficial.    Erby Pian, [facility] Pager [phone]([id number]:[phone])   12/13/2013 8:47 AM

## 2013-12-13 NOTE — Progress Notes (Signed)
UR completed 

## 2013-12-14 DIAGNOSIS — R1032 Left lower quadrant pain: Secondary | ICD-10-CM

## 2013-12-14 DIAGNOSIS — N179 Acute kidney failure, unspecified: Secondary | ICD-10-CM

## 2013-12-14 LAB — BASIC METABOLIC PANEL
Anion gap: 9 (ref 5–15)
BUN: 10 mg/dL (ref 6–23)
CHLORIDE: 103 meq/L (ref 96–112)
CO2: 25 mEq/L (ref 19–32)
Calcium: 8.3 mg/dL — ABNORMAL LOW (ref 8.4–10.5)
Creatinine, Ser: 0.91 mg/dL (ref 0.50–1.10)
GFR calc non Af Amer: 74 mL/min — ABNORMAL LOW (ref >90)
GFR, EST AFRICAN AMERICAN: 86 mL/min — AB (ref >90)
Glucose, Bld: 161 mg/dL — ABNORMAL HIGH (ref 70–99)
POTASSIUM: 3.8 meq/L (ref 3.7–5.3)
Sodium: 137 mEq/L (ref 137–147)

## 2013-12-14 LAB — TYPE AND SCREEN
ABO/RH(D): B POS
Antibody Screen: NEGATIVE
UNIT DIVISION: 0
Unit division: 0

## 2013-12-14 LAB — CBC
HCT: 31.9 % — ABNORMAL LOW (ref 36.0–46.0)
Hemoglobin: 10.7 g/dL — ABNORMAL LOW (ref 12.0–15.0)
MCH: 25.7 pg — ABNORMAL LOW (ref 26.0–34.0)
MCHC: 33.5 g/dL (ref 30.0–36.0)
MCV: 76.5 fL — ABNORMAL LOW (ref 78.0–100.0)
PLATELETS: 109 10*3/uL — AB (ref 150–400)
RBC: 4.17 MIL/uL (ref 3.87–5.11)
RDW: 16.4 % — AB (ref 11.5–15.5)
WBC: 1.9 10*3/uL — AB (ref 4.0–10.5)

## 2013-12-14 MED ORDER — INFLUENZA VAC SPLIT QUAD 0.5 ML IM SUSY
0.5000 mL | PREFILLED_SYRINGE | INTRAMUSCULAR | Status: DC
  Filled 2013-12-14: qty 0.5

## 2013-12-14 MED ORDER — DIPHENOXYLATE-ATROPINE 2.5-0.025 MG PO TABS
1.0000 | ORAL_TABLET | Freq: Once | ORAL | Status: AC
  Administered 2013-12-14: 1 via ORAL
  Filled 2013-12-14: qty 1

## 2013-12-14 MED ORDER — HEPARIN SOD (PORK) LOCK FLUSH 100 UNIT/ML IV SOLN
500.0000 [IU] | Freq: Once | INTRAVENOUS | Status: AC
  Administered 2013-12-14: 500 [IU] via INTRAVENOUS
  Filled 2013-12-14: qty 5

## 2013-12-14 MED ORDER — INFLUENZA VAC SPLIT QUAD 0.5 ML IM SUSY
0.5000 mL | PREFILLED_SYRINGE | Freq: Once | INTRAMUSCULAR | Status: AC
  Administered 2013-12-14: 0.5 mL via INTRAMUSCULAR
  Filled 2013-12-14: qty 0.5

## 2013-12-14 NOTE — Progress Notes (Signed)
Pt seen and examined at the bedside. Vitals stable. Pt stable for discharge. Please refer to discharge summary done 12/13/2013. No changes in management overnight.   Leisa Lenz [facility] [phone]

## 2013-12-14 NOTE — Progress Notes (Addendum)
Towards the beginning of the shift, patient appeared very lethargic, and intermittently confused/forgetful.  She had an episode of incontinence as well.  Patient's son arrived and was very concerned about patient's mental status.  He was also worried that she was receiving too much medication.  Patient's son was told that patient had not received any narcotics/ anti-anxiety medications since 1 pm that afternoon and that scheduled Ativan had been held the past 2 doses as well.  Patient also had a temp of 100.4.  On call hospitalist was notified regarding these issues.  Two on call providers came up to evaluate patient.  Orders were received and initiated.  Patient is now sleeping comfortably right now. Vitals stable. Waiting on Cdiff sample.  Bed alarm in place.  Will continue to monitor patient. Azzie Glatter [place]

## 2013-12-14 NOTE — Progress Notes (Signed)
Pt. Left via wheelchair with son. Alert and oriented x 3. No respiratory distress noted.

## 2013-12-14 NOTE — Progress Notes (Signed)
Discharge teaching completed with son Elberta Fortis. Instructed to follow up with Inter[facility] Radiology [phone]. No prescriptions given, pt. states she has all medications at home. Pt. Will call [facility] tomorrow to continue with CNA as previously ordered. States will have family at home to help her.  Pt. Valuables given per valuable envelope. Answered all questions.

## 2013-12-14 NOTE — Progress Notes (Signed)
Pt. up most of day, decrease lethargy noted. Alert and oriented to person, place, and disoriented to year. States yesterday was her birthday.Pt. to be discharged. MD notified of no stool collected for C-diff. Order for lomotil received. Pt. To follow up on Monday for G-Tube. Spoke with son Elberta Fortis and told him of discharge.

## 2013-12-16 ENCOUNTER — Other Ambulatory Visit (HOSPITAL_COMMUNITY): Payer: Self-pay | Admitting: Physician Assistant

## 2013-12-16 ENCOUNTER — Ambulatory Visit: Payer: No Typology Code available for payment source

## 2013-12-16 ENCOUNTER — Ambulatory Visit (HOSPITAL_COMMUNITY)
Admission: RE | Admit: 2013-12-16 | Discharge: 2013-12-16 | Disposition: A | Discharge status: Home / Self Care | Payer: No Typology Code available for payment source | Source: Ambulatory Visit | Attending: Physician Assistant | Admitting: Physician Assistant

## 2013-12-16 DIAGNOSIS — Z431 Encounter for attention to gastrostomy: Secondary | ICD-10-CM | POA: Insufficient documentation

## 2013-12-16 DIAGNOSIS — K9423 Gastrostomy malfunction: Principal | ICD-10-CM

## 2013-12-16 DIAGNOSIS — Z85028 Personal history of other malignant neoplasm of stomach: Secondary | ICD-10-CM | POA: Diagnosis not present

## 2013-12-16 DIAGNOSIS — K9420 Gastrostomy complication, unspecified: Secondary | ICD-10-CM

## 2013-12-16 NOTE — Discharge Instructions (Addendum)
Feeding tube site will continue to drain for a few days.  Change the bandage as needed to keep site clean and dry.  Do not submerge area under water until site has completely healed.  Ok to shower tomorrow, 12-17-13, and change bandage after showering.  Contact your primary physician with any further issues/concerns.

## 2013-12-16 NOTE — Procedures (Signed)
Pt with G-J tube, noted to have J-limb coiled in stomach on last admission. This was last replaced on 10/29/13. She is actually now eating regular diet and not using the tube for nutrition anymore. At the pt's request, we have removed her G-J tube without difficulty today. Sterile dressing applied. Instructions reviewed.  Ascencion Dike PA-C [facility]iology 12/16/2013 10:52 AM

## 2013-12-17 ENCOUNTER — Ambulatory Visit (HOSPITAL_BASED_OUTPATIENT_CLINIC_OR_DEPARTMENT_OTHER): Payer: No Typology Code available for payment source | Admitting: Nurse Practitioner

## 2013-12-17 ENCOUNTER — Ambulatory Visit: Payer: No Typology Code available for payment source

## 2013-12-17 ENCOUNTER — Other Ambulatory Visit: Payer: Self-pay | Admitting: Nurse Practitioner

## 2013-12-17 ENCOUNTER — Ambulatory Visit (HOSPITAL_COMMUNITY)
Admission: RE | Admit: 2013-12-17 | Discharge: 2013-12-17 | Disposition: A | Discharge status: Home / Self Care | Payer: No Typology Code available for payment source | Source: Ambulatory Visit | Attending: Nurse Practitioner | Admitting: Nurse Practitioner

## 2013-12-17 ENCOUNTER — Encounter: Payer: Self-pay | Admitting: Genetic Counselor

## 2013-12-17 ENCOUNTER — Other Ambulatory Visit (HOSPITAL_BASED_OUTPATIENT_CLINIC_OR_DEPARTMENT_OTHER): Payer: No Typology Code available for payment source

## 2013-12-17 ENCOUNTER — Ambulatory Visit: Payer: No Typology Code available for payment source | Admitting: Nutrition

## 2013-12-17 ENCOUNTER — Ambulatory Visit (HOSPITAL_BASED_OUTPATIENT_CLINIC_OR_DEPARTMENT_OTHER): Payer: No Typology Code available for payment source

## 2013-12-17 VITALS — BP 110/70 | HR 81 | Temp 98.0°F

## 2013-12-17 DIAGNOSIS — G620 Drug-induced polyneuropathy: Secondary | ICD-10-CM

## 2013-12-17 DIAGNOSIS — M79609 Pain in unspecified limb: Secondary | ICD-10-CM

## 2013-12-17 DIAGNOSIS — R600 Localized edema: Secondary | ICD-10-CM

## 2013-12-17 DIAGNOSIS — T82598A Other mechanical complication of other cardiac and vascular devices and implants, initial encounter: Secondary | ICD-10-CM

## 2013-12-17 DIAGNOSIS — T85618A Breakdown (mechanical) of other specified internal prosthetic devices, implants and grafts, initial encounter: Secondary | ICD-10-CM

## 2013-12-17 DIAGNOSIS — C169 Malignant neoplasm of stomach, unspecified: Secondary | ICD-10-CM

## 2013-12-17 DIAGNOSIS — C772 Secondary and unspecified malignant neoplasm of intra-abdominal lymph nodes: Secondary | ICD-10-CM

## 2013-12-17 DIAGNOSIS — Z452 Encounter for adjustment and management of vascular access device: Secondary | ICD-10-CM

## 2013-12-17 DIAGNOSIS — D61818 Other pancytopenia: Secondary | ICD-10-CM

## 2013-12-17 DIAGNOSIS — T85698A Other mechanical complication of other specified internal prosthetic devices, implants and grafts, initial encounter: Secondary | ICD-10-CM

## 2013-12-17 DIAGNOSIS — T451X5A Adverse effect of antineoplastic and immunosuppressive drugs, initial encounter: Secondary | ICD-10-CM

## 2013-12-17 DIAGNOSIS — M7989 Other specified soft tissue disorders: Secondary | ICD-10-CM

## 2013-12-17 DIAGNOSIS — E8809 Other disorders of plasma-protein metabolism, not elsewhere classified: Secondary | ICD-10-CM

## 2013-12-17 DIAGNOSIS — G893 Neoplasm related pain (acute) (chronic): Secondary | ICD-10-CM

## 2013-12-17 LAB — COMPREHENSIVE METABOLIC PANEL (CC13)
ALBUMIN: 2.3 g/dL — AB (ref 3.5–5.0)
ALK PHOS: 43 U/L (ref 40–150)
ALT: 12 U/L (ref 0–55)
AST: 10 U/L (ref 5–34)
Anion Gap: 7 mEq/L (ref 3–11)
BUN: 4.3 mg/dL — ABNORMAL LOW (ref 7.0–26.0)
CO2: 27 mEq/L (ref 22–29)
Calcium: 7.9 mg/dL — ABNORMAL LOW (ref 8.4–10.4)
Chloride: 104 mEq/L (ref 98–109)
Creatinine: 0.7 mg/dL (ref 0.6–1.1)
Glucose: 102 mg/dl (ref 70–140)
POTASSIUM: 3.8 meq/L (ref 3.5–5.1)
SODIUM: 138 meq/L (ref 136–145)
TOTAL PROTEIN: 4.8 g/dL — AB (ref 6.4–8.3)
Total Bilirubin: 0.43 mg/dL (ref 0.20–1.20)

## 2013-12-17 LAB — CBC WITH DIFFERENTIAL/PLATELET
BASO%: 0.4 % (ref 0.0–2.0)
Basophils Absolute: 0 10*3/uL (ref 0.0–0.1)
EOS%: 1.2 % (ref 0.0–7.0)
Eosinophils Absolute: 0 10*3/uL (ref 0.0–0.5)
HCT: 28.3 % — ABNORMAL LOW (ref 34.8–46.6)
HGB: 8.9 g/dL — ABNORMAL LOW (ref 11.6–15.9)
LYMPH%: 46.1 % (ref 14.0–49.7)
MCH: 25.1 pg (ref 25.1–34.0)
MCHC: 31.6 g/dL (ref 31.5–36.0)
MCV: 79.5 fL (ref 79.5–101.0)
MONO#: 0.9 10*3/uL (ref 0.1–0.9)
MONO%: 21.7 % — AB (ref 0.0–14.0)
NEUT#: 1.2 10*3/uL — ABNORMAL LOW (ref 1.5–6.5)
NEUT%: 30.6 % — ABNORMAL LOW (ref 38.4–76.8)
PLATELETS: 175 10*3/uL (ref 145–400)
RBC: 3.56 10*6/uL — AB (ref 3.70–5.45)
RDW: 17.8 % — AB (ref 11.2–14.5)
WBC: 4.1 10*3/uL (ref 3.9–10.3)
lymph#: 1.9 10*3/uL (ref 0.9–3.3)

## 2013-12-17 MED ORDER — PROCHLORPERAZINE MALEATE 10 MG PO TABS
ORAL_TABLET | ORAL | Status: AC
  Filled 2013-12-17: qty 1

## 2013-12-17 MED ORDER — ALTEPLASE 2 MG IJ SOLR
2.0000 mg | Freq: Once | INTRAMUSCULAR | Status: AC | PRN
  Administered 2013-12-17: 2 mg
  Filled 2013-12-17: qty 2

## 2013-12-17 MED ORDER — HYDROMORPHONE HCL 2 MG PO TABS: 2.0000 mg | ORAL_TABLET | Freq: Four times a day (QID) | ORAL | Status: DC | PRN

## 2013-12-17 NOTE — Progress Notes (Signed)
VASCULAR LAB PRELIMINARY  PRELIMINARY  PRELIMINARY  PRELIMINARY  Left lower extremity venous duplex completed.    Preliminary report:  Left:  No evidence of DVT, superficial thrombosis, or Baker's cyst.  Bonnee Zertuche, RVS 12/17/2013, 4:32 PM

## 2013-12-17 NOTE — Progress Notes (Signed)
GENETIC TEST RESULTS  Patient Name: Paula [name] Patient Age: 48 y.o. Encounter Date: 12/17/2013  Referring Physician: Curt Bears, MD   [name] was called today to discuss genetic test results. Please see the Genetics note from her visit on 11/14/13 for a detailed discussion of her personal and family history.  GENETIC TESTING: At the time of [name]'s visit, we recommended she pursue genetic testing of multiple genes on the [facility] gene panel. This test, which included sequencing and deletion/duplication analysis of 17 genes, was performed at [facility]. Testing was normal and did not reveal any clearly pathogenic mutation in any of these genes. The genes tested were APC, BMPR1A, CDH1, CHEK2, EPCAM, GREM1, MLH1, MSH2, MSH6, MUTYH, PMS2, POLD1, POLE, PTEN, SMAD4, STK11, and TP53.  We discussed with [name] that since the current test is not perfect, it is possible there may be a gene mutation that current testing cannot detect, but that chance is small. We also discussed that it is possible that a different genetic factor, which was not part of this testing or has not yet been discovered, is responsible for the cancer diagnoses in the family. Should [name] wish to discuss or pursue this additional testing, we are happy to coordinate this at any time, but do not feel that she is at significant risk of harboring a mutation in a different gene.     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 This is a variant in the 5' UTR (promoter) region. At this time, it is unknown if this variant is associated with increased cancer risk or if this is a normal finding, but most variants such as this get reclassified to being inconsequential. It should not be used to make medical management decisions. With time, we suspect the lab will determine the significance of this variant, if any. If we do learn  more about it, we will try to contact [name] to discuss it further. However, it is important to stay in touch with [place] periodically and keep the address and phone number up to date.   CANCER SCREENING: This result suggests that [name]'s cancer was most likely not due to an inherited predisposition. Most cancers happen by chance and this test, along with details of her family history, suggests that her cancer falls into this category. We, therefore, recommended she continue to follow the cancer screening guidelines provided by her physicians.   FAMILY MEMBERS: Family members are at some increased risk of developing cancer, over the general population risk, simply due to the family history. We recommended women have a yearly mammogram beginning at age 62, a yearly clinical breast exam, and perform monthly breast self-exams. A gynecologic exam is recommended yearly. Colon cancer screening is recommended for men and women by age 63.  Family members should not pursue testing for the above VUS outside of a research setting as it has no implications for their medical management.  Lastly, we discussed with [name] that cancer genetics is a rapidly advancing field and it is possible that new genetic tests will be appropriate for her in the future. We encouraged her to remain in contact with [place] on an annual basis so we can update her personal and family histories, and let her know of advances in cancer genetics that may benefit the family. Our contact number was provided. Paula [name] questions were answered to her satisfaction today, and she knows she is welcome to call  anytime with additional questions.    Steele Berg, MS, [employer] Certified Genetic Counselor phone: [phone] [name]

## 2013-12-17 NOTE — Progress Notes (Signed)
Pt sent for doppler due to left leg swelling, results negative.  ANC 1.2, pt to return tomorrow for CBC recheck and possible chemo.  Pt verbalized understanding.

## 2013-12-17 NOTE — Progress Notes (Signed)
Patient status post admission in the hospital.  Noted patient's jejunostomy feeding tube was removed secondary to patient's wishes.   Weight documented as 126.3 pounds. Weight loss continues. Patient has been seen multiple times by RD and documented with severe malnutrition which continues. Patient educated to continue Ensure Plus at least 3 times a day between meals.  Enforced the importance of high-calorie, high-protein foods. Nutrition diagnosis of inadequate oral intake continues. Patient will work to continue to increase oral intake to promote weight stabilization.  She has my contact information for questions or concerns. **Disclaimer: This note was dictated with voice recognition software. Similar sounding words can inadvertently be transcribed and this note may contain transcription errors which may not have been corrected upon publication of note.**

## 2013-12-18 ENCOUNTER — Ambulatory Visit: Payer: Self-pay

## 2013-12-18 ENCOUNTER — Other Ambulatory Visit: Payer: Self-pay | Admitting: Medical Oncology

## 2013-12-18 ENCOUNTER — Telehealth: Payer: Self-pay | Admitting: Medical Oncology

## 2013-12-18 ENCOUNTER — Other Ambulatory Visit: Payer: Self-pay

## 2013-12-18 ENCOUNTER — Ambulatory Visit: Payer: No Typology Code available for payment source

## 2013-12-18 ENCOUNTER — Encounter: Payer: Self-pay | Admitting: Nurse Practitioner

## 2013-12-18 DIAGNOSIS — R600 Localized edema: Secondary | ICD-10-CM | POA: Insufficient documentation

## 2013-12-18 DIAGNOSIS — T451X5A Adverse effect of antineoplastic and immunosuppressive drugs, initial encounter: Secondary | ICD-10-CM

## 2013-12-18 DIAGNOSIS — G893 Neoplasm related pain (acute) (chronic): Secondary | ICD-10-CM | POA: Insufficient documentation

## 2013-12-18 DIAGNOSIS — T85618A Breakdown (mechanical) of other specified internal prosthetic devices, implants and grafts, initial encounter: Secondary | ICD-10-CM

## 2013-12-18 DIAGNOSIS — T85698A Other mechanical complication of other specified internal prosthetic devices, implants and grafts, initial encounter: Secondary | ICD-10-CM | POA: Insufficient documentation

## 2013-12-18 DIAGNOSIS — E8809 Other disorders of plasma-protein metabolism, not elsewhere classified: Secondary | ICD-10-CM | POA: Insufficient documentation

## 2013-12-18 DIAGNOSIS — G620 Drug-induced polyneuropathy: Secondary | ICD-10-CM | POA: Insufficient documentation

## 2013-12-18 NOTE — Telephone Encounter (Signed)
Lab cancelled for tomorrow. , had labs yesterday

## 2013-12-18 NOTE — ED Provider Notes (Signed)
Medical screening examination/treatment/procedure(s) were conducted as a shared visit with non-physician practitioner(s) and myself.  I personally evaluated the patient during the encounter.   EKG Interpretation None     48 year old female with a history of gastric cancer currently on chemotherapy with pain at the site of G-tube. Place at the end of July. Increasing pain around the site and some drainage over the past week. On my exam the site looks fine externally. I cannot appreciate any drainage. No concerning surrounding skin changes. Her pain seems out of proportion to me. She is tender elsewhere in her abdomen. I think is reasonable to CT her abdomen pelvis particularly as I am not convinced that her symptoms are actually related to her G-tube. Disposition pending CT results and symptom control.  Virgel Manifold, MD 12/18/13 1009

## 2013-12-18 NOTE — Addendum Note (Signed)
Addended by: [name] on: 12/18/2013 03:48 PM   Modules accepted: Medications

## 2013-12-18 NOTE — Assessment & Plan Note (Signed)
Was unable to draw back with patient's Port-A-Cath today; and patient did require a Cathflo.  After Cathflo-Port-A-Cath flushed easily and was able to draw back.

## 2013-12-18 NOTE — Telephone Encounter (Signed)
I returned her call about appt. She said she cannot make appts today . Lab r/s for tomorrow. I told her to keep appt for next week.

## 2013-12-18 NOTE — Assessment & Plan Note (Signed)
Patient continues to complain of some chronic, generalized pain.  She continues to use a Fentanyl patch; but has discontinued hydromorphone elixir to her PEG tube (PEG tube was removed earlier this week.)  She is requesting hydromorphone tablets  for breakthrough pain instead.  This prescription was given with patient today.

## 2013-12-18 NOTE — Assessment & Plan Note (Signed)
Albumin remains decreased; and patient was encouraged to push protein is much as possible.

## 2013-12-18 NOTE — Assessment & Plan Note (Signed)
Cycle 1, day 22 of patient's 5-FU/leucovorin chemotherapy was held last week due to neutropenia.  Patient presented today to receive cycle 1, day 29 of the same regimen; but was unable to draw back from patient's Port-A-Cath.  Patient's Port-A-Cath required Cathflo for approximately 45 minutes; and then was flushed/drawn back easily.  To time constraints-patient's chemotherapy was held today and patient has plans to return tomorrow afternoon 12/10/2013 for planned cycle 1, day 29 of her chemotherapy.  She also has plans to return 12/24/2013 for cycle 1, day 36 of the same regimen.

## 2013-12-18 NOTE — Assessment & Plan Note (Signed)
Patient is complaining of mild, progressive neuropathy to all of her extremities.  She is complaining specifically of worsening discomfort to the soles of her feet.  This is a chemotherapy side effect.  She is also noted some mild discoloration of her bilateral hands; but no specific hand/foot syndrome noted.

## 2013-12-18 NOTE — Assessment & Plan Note (Signed)
Patient was noted daily neutropenic last week; and chemotherapy was held.  Patient did receive 3 days of Granix  300 mcg; and ANC is back up to a 1.2.  Hemoglobin is trending down and is currently at 8.9.  Patient denies any worsening of fatigue or shortness of breath with exertion.

## 2013-12-18 NOTE — Progress Notes (Signed)
SYMPTOM MANAGEMENT CLINIC   HPI: Paula [name] 48 y.o. female diagnosed with gastric cancer.  Currently undergoing 5-FU /leucovorin chemotherapy therapy regimen.  Cycle 1, day 22 of patient's chemotherapy was held last week due to neutropenia.  Patient received Granix 300 mcg for a total of 3 days last week.  Patient presented today for cycle 1, day 29 of the same regimen.  However-patient's Port-A-Cath was clogged; and required Cathflo to open.   Patient is complaining of increased neuropathy discomfort to all of her extremities.  She states her feet are bothering her the most.  She also states that she tripped over computer box her house just yesterday; and is now noted some left lower extremity edema.  She is complaining of some mild discomfort to her left posterior calf.  She denies any chest pain, chest pressure, or shortness of breath.  She has any pain with inspiration.  She denies any new GI symptoms.  She denies any recent fevers or chills.  Also, patient just had her PEG tube removed per Dr. Barry Dienes earlier this week.  I she states that she was using hydromorphone elixir per her PEG tube of for breakthrough pain; but has been instructed not to use the hydromorphone elixir orally.  She is requesting a 4 tablets for breakthrough pain today.  HPI  CURRENT THERAPY: Upcoming Treatment Dates - gastric [place] 5FU / Leucovorin D [phone] q56d Days with orders from any treatment category:  12/18/2013      SCHEDULING COMMUNICATION      prochlorperazine (COMPAZINE) tablet 10 mg      leucovorin 810 mg in dextrose 5 % 250 mL infusion      fluorouracil (ADRUCIL) chemo injection 950 mg      sodium chloride 0.9 % injection 10 mL      heparin lock flush 100 unit/mL      heparin lock flush 100 unit/mL      alteplase (CATHFLO ACTIVASE) injection 2 mg      sodium chloride 0.9 % injection 3 mL      Cold Pack 1 packet      0.9 %  sodium chloride infusion      TREATMENT  CONDITIONS 12/24/2013      SCHEDULING COMMUNICATION      prochlorperazine (COMPAZINE) tablet 10 mg      leucovorin 810 mg in dextrose 5 % 250 mL infusion      fluorouracil (ADRUCIL) chemo injection 950 mg      sodium chloride 0.9 % injection 10 mL      heparin lock flush 100 unit/mL      heparin lock flush 100 unit/mL      alteplase (CATHFLO ACTIVASE) injection 2 mg      sodium chloride 0.9 % injection 3 mL      Cold Pack 1 packet      0.9 %  sodium chloride infusion      TREATMENT CONDITIONS 12/31/2013      SCHEDULING COMMUNICATION      prochlorperazine (COMPAZINE) tablet 10 mg      leucovorin 810 mg in dextrose 5 % 250 mL infusion      fluorouracil (ADRUCIL) chemo injection 950 mg      sodium chloride 0.9 % injection 10 mL      heparin lock flush 100 unit/mL      heparin lock flush 100 unit/mL      alteplase (CATHFLO ACTIVASE) injection 2 mg      sodium chloride  0.9 % injection 3 mL      Cold Pack 1 packet      0.9 %  sodium chloride infusion      TREATMENT CONDITIONS    ROS  Past Medical History  Diagnosis Date  . Headache(784.0) 2011    Migraines  . Esophageal reflux     On omeprazole  . Costochondritis   . Hypertension     started around 2011  . Ovarian cyst   . Stomach cancer   . Family history of cancer   . Heart murmur     Past Surgical History  Procedure Laterality Date  . Oophorectomy      Around 1985 left ovary removal  . Tonsillectomy      Around 1994  . Tubal ligation    . Esophagogastroduodenoscopy N/A 09/16/2013    Procedure: ESOPHAGOGASTRODUODENOSCOPY (EGD);  Surgeon: Wonda Horner, MD;  Location: [facility];  Service: Endoscopy;  Laterality: N/A;  . Laparoscopy N/A 09/20/2013    Procedure: DIAGNOSTIC LAPAROSCOPY,DISTAL GASTRECTOMY AND FEEDING GASTROJEJUNOSTOMY;  Surgeon: [name], MD;  Location: [facility];  Service: General;  Laterality: N/A;  . Portacath placement Left 11/18/2013    Procedure: INSERTION PORT-A-CATH;  Surgeon: [name],  MD;  Location: [facility];  Service: General;  Laterality: Left;    has Chest pain; Essential hypertension; Murmur; Mitral regurgitation; Left knee injury; Low back pain; PTSD (post-traumatic stress disorder); Migraine headache; Antral ulcer; Gastric carcinoma s/p Distal gastrectomy/GJ (Billroth II) 09/20/2013; Malnutrition of moderate degree; Back pain; Intractable nausea and vomiting; Protein-calorie malnutrition, severe; Anemia, unspecified; Pain of left upper arm; Gastric atony; Constipation, chronic; Chronic low back pain; Acute kidney injury; Palliative care encounter; Nausea & vomiting; Abdominal pain, left lower quadrant; Somatization disorder; Cancer of antrum of stomach; Family history of cancer; Weight loss; PEG tube malfunction; Dehiscence of incision; Hyponatremia; Hypokalemia; Other pancytopenia; Neuropathy due to chemotherapeutic drug; Hypoalbuminemia; Malfunction of device; Leg edema; and Neoplasm related pain on her problem list.     has No Known Allergies.    Medication List       This list is accurate as of: 12/17/13 11:59 PM.  Always use your most recent med list.               carvedilol 6.25 MG tablet  Commonly known as:  COREG  Take 6.25 mg by mouth 2 (two) times daily with a meal.     ENSURE COMPLETE PO  Take 237 mLs by mouth daily. Takes a little at a time, in order to keep it down.     fentaNYL 25 MCG/HR patch  Commonly known as:  DURAGESIC - dosed mcg/hr  Place 1 patch (25 mcg total) onto the skin every 3 (three) days.     HYDROmorphone 2 MG tablet  Commonly known as:  DILAUDID  Take 1 tablet (2 mg total) by mouth every 6 (six) hours as needed for severe pain (PRN breakthru pain).     lidocaine-prilocaine cream  Commonly known as:  EMLA  Apply 1 application topically as needed. Apply to port 1 hour before chemo     LORazepam 1 MG tablet  Commonly known as:  ATIVAN  Take 1 mg by mouth 3 (three) times daily.     metoCLOPramide 10 MG tablet  Commonly known  as:  REGLAN  Take 1 tablet (10 mg total) by mouth 4 (four) times daily.     MULTIVITAMIN & MINERAL PO  Take 1 tablet by mouth daily.  OLANZapine zydis 5 MG disintegrating tablet  Commonly known as:  ZYPREXA  Take 1 tablet (5 mg total) by mouth at bedtime.     ondansetron 8 MG disintegrating tablet  Commonly known as:  ZOFRAN-ODT  Take 1 tablet (8 mg total) by mouth every 8 (eight) hours as needed for nausea or vomiting.     pantoprazole 40 MG tablet  Commonly known as:  PROTONIX  Take 1 tablet (40 mg total) by mouth 2 (two) times daily before a meal.         PHYSICAL EXAMINATION  Vitals: BP 110/70, HR 81, temp 98  Physical Exam  Nursing note and vitals reviewed. Constitutional: She is oriented to person, place, and time. Vital signs are normal. She appears unhealthy.  HENT:  Head: Normocephalic.  Mouth/Throat: Oropharynx is clear and moist.  Eyes: Conjunctivae and EOM are normal. Pupils are equal, round, and reactive to light. No scleral icterus.  Neck: Normal range of motion. No JVD present.  Pulmonary/Chest: Effort normal. No respiratory distress.  Musculoskeletal: Normal range of motion. She exhibits edema. She exhibits no tenderness.  Mild edema to entire left lower extremity; but no calf tenderness on exam.  Patient observed with full range of motion to all extremities.  Alternates are warm and no pulses are palpable.  Neurological: She is alert and oriented to person, place, and time. Gait normal.  Skin: Skin is warm and dry. No rash noted. No erythema.  Patient does have some trace discoloration to her bilateral hands.  No obvious hand/foot syndrome noted.  Psychiatric: Affect normal.    LABORATORY DATA:. Appointment on 12/17/2013  Component Date Value Ref Range Status  . WBC 12/17/2013 4.1  3.9 - 10.3 10e3/uL Final  . NEUT# 12/17/2013 1.2* 1.5 - 6.5 10e3/uL Final  . HGB 12/17/2013 8.9* 11.6 - 15.9 g/dL Final  . HCT 12/17/2013 28.3* 34.8 - 46.6 % Final  .  Platelets 12/17/2013 175  145 - 400 10e3/uL Final  . MCV 12/17/2013 79.5  79.5 - 101.0 fL Final  . MCH 12/17/2013 25.1  25.1 - 34.0 pg Final  . MCHC 12/17/2013 31.6  31.5 - 36.0 g/dL Final  . RBC 12/17/2013 3.56* 3.70 - 5.45 10e6/uL Final  . RDW 12/17/2013 17.8* 11.2 - 14.5 % Final  . lymph# 12/17/2013 1.9  0.9 - 3.3 10e3/uL Final  . MONO# 12/17/2013 0.9  0.1 - 0.9 10e3/uL Final  . Eosinophils Absolute 12/17/2013 0.0  0.0 - 0.5 10e3/uL Final  . Basophils Absolute 12/17/2013 0.0  0.0 - 0.1 10e3/uL Final  . NEUT% 12/17/2013 30.6* 38.4 - 76.8 % Final  . LYMPH% 12/17/2013 46.1  14.0 - 49.7 % Final  . MONO% 12/17/2013 21.7* 0.0 - 14.0 % Final  . EOS% 12/17/2013 1.2  0.0 - 7.0 % Final  . BASO% 12/17/2013 0.4  0.0 - 2.0 % Final  . Sodium 12/17/2013 138  136 - 145 mEq/L Final  . Potassium 12/17/2013 3.8  3.5 - 5.1 mEq/L Final  . Chloride 12/17/2013 104  98 - 109 mEq/L Final  . CO2 12/17/2013 27  22 - 29 mEq/L Final  . Glucose 12/17/2013 102  70 - 140 mg/dl Final  . BUN 12/17/2013 4.3* 7.0 - 26.0 mg/dL Final  . Creatinine 12/17/2013 0.7  0.6 - 1.1 mg/dL Final  . Total Bilirubin 12/17/2013 0.43  0.20 - 1.20 mg/dL Final  . Alkaline Phosphatase 12/17/2013 43  40 - 150 U/L Final  . AST 12/17/2013 10  5 - 34 U/L  Final  . ALT 12/17/2013 12  0 - 55 U/L Final  . Total Protein 12/17/2013 4.8* 6.4 - 8.3 g/dL Final  . Albumin 12/17/2013 2.3* 3.5 - 5.0 g/dL Final  . Calcium 12/17/2013 7.9* 8.4 - 10.4 mg/dL Final  . Anion Gap 12/17/2013 7  3 - 11 mEq/L Final     RADIOGRAPHIC STUDIES: Ir Gastrostomy Tube Removal  12/16/2013   CLINICAL DATA:  Patient with prior history of gastric cancer status post partial gastrectomy with Billroth II anastomosis and surgically created be gastrojejunostomy tube several months ago. Patient currently is able to tolerate p.o. nutrition and request gastrojejunostomy tube removal  EXAM: PERCUTANEOUS GASTROSTOMY TUBE REMOVAL  PROCEDURE: Gastrostomy tube removal was discussed  with the patient and questions were answered. Risk of infection and bleeding were also discussed. Written as well as verbal consent was obtained.  Medications: None  Total Moderate Sedation Time: None.  The gastrostomy tube and surrounding skin were draped and prepped in sterile fashion. The gastro-jejunostomy tube was removed in its entirety by applying manual traction after deflation of the retention balloon. Pressure was held until hemostasis was obtained. The patient tolerated the procedure well with no immediate complications. A clean sterile dressing was applied and the patient was given instructions.  COMPLICATIONS: None  IMPRESSION: Successful gastrojejunostomy tube removal as described above.  Read by: Ascencion Dike PA-C   Electronically Signed   By: Aletta Edouard M.D.   On: 12/16/2013 11:47    ASSESSMENT/PLAN:    Gastric carcinoma s/p Distal gastrectomy/GJ (Billroth II) 09/20/2013  Assessment & Plan  Cycle 1, day 22 of patient's 5-FU/leucovorin chemotherapy was held last week due to neutropenia.  Patient presented today to receive cycle 1, day 29 of the same regimen; but was unable to draw back from patient's Port-A-Cath.  Patient's Port-A-Cath required Cathflo for approximately 45 minutes; and then was flushed/drawn back easily.  To time constraints-patient's chemotherapy was held today and patient has plans to return tomorrow afternoon 12/10/2013 for planned cycle 1, day 29 of her chemotherapy.  She also has plans to return 12/24/2013 for cycle 1, day 36 of the same regimen.   Other pancytopenia  Assessment & Plan Patient was noted daily neutropenic last week; and chemotherapy was held.  Patient did receive 3 days of Granix  300 mcg; and ANC is back up to a 1.2.  Hemoglobin is trending down and is currently at 8.9.  Patient denies any worsening of fatigue or shortness of breath with exertion.    Neuropathy due to chemotherapeutic drug  Assessment & Plan Patient is complaining of  mild, progressive neuropathy to all of her extremities.  She is complaining specifically of worsening discomfort to the soles of her feet.  This is a chemotherapy side effect.  She is also noted some mild discoloration of her bilateral hands; but no specific hand/foot syndrome noted.   Hypoalbuminemia  Assessment & Plan Albumin remains decreased; and patient was encouraged to push protein is much as possible.   Malfunction of device  Assessment & Plan Was unable to draw back with patient's Port-A-Cath today; and patient did require a Cathflo.  After Cathflo-Port-A-Cath flushed easily and was able to draw back.   Leg edema  Assessment & Plan Patient states that she did trip over a computer box yesterday; and has noted some increased left lower extremity edema.  She is also complaining of some mild discomfort to her left  posterior calf. Doppler ultrasound obtained today was negative for DVT.  Advised  patient to keep her leg elevated above the level of for heart.  She can also try compression stockings as well.   Neoplasm related pain  Assessment & Plan Patient continues to complain of some chronic, generalized pain.  She continues to use a Fentanyl patch; but has discontinued hydromorphone elixir to her PEG tube (PEG tube was removed earlier this week.)  She is requesting hydromorphone tablets  for breakthrough pain instead.  This prescription was given with patient today.   Patient stated understanding of all instructions; and was in agreement with this plan of care. The patient knows to call the clinic with any problems, questions or concerns.   Review/collaboration with Dr. [name] regarding all aspects of patient's visit today.   Total time spent with patient was 25 minutes;  with greater than 90 percent of that time spent in face to face counseling regarding her symptoms, and coordination of care and follow up.  Disclaimer: This note was dictated with voice recognition software.  Similar sounding words can inadvertently be transcribed and may not be corrected upon review.   Drue Second, NP 12/18/2013

## 2013-12-18 NOTE — Assessment & Plan Note (Signed)
Patient states that she did trip over a computer box yesterday; and has noted some increased left lower extremity edema.  She is also complaining of some mild discomfort to her left  posterior calf. Doppler ultrasound obtained today was negative for DVT.  Advised patient to keep her leg elevated above the level of for heart.  She can also try compression stockings as well.

## 2013-12-19 ENCOUNTER — Other Ambulatory Visit: Payer: Self-pay

## 2013-12-19 ENCOUNTER — Telehealth: Payer: Self-pay | Admitting: *Deleted

## 2013-12-19 ENCOUNTER — Ambulatory Visit: Payer: No Typology Code available for payment source

## 2013-12-19 NOTE — Telephone Encounter (Signed)
Message copied by Patton Salles on Thu Dec 19, 2013 11:00 AM ------      Message from: Drue Second R      Created: Wed Dec 18, 2013  4:02 PM       PROVIDER:  [facility] ONLY      Triage: follow up call 24-48 hours please. ------

## 2013-12-19 NOTE — Telephone Encounter (Signed)
Spoke with patient regarding visit to [facility] on 10/27. Pt states her leg is "a little better", both legs still swollen- reinforced keeping legs elevated as much as possbile. Pt state she has not needed dilaudid for breakthrough pain. Is drinking a lot of fluids and voiding frequently- denies any pain or burning with urination. To call if has any further problems. Pt is scheduled for follow up visit on 11/3.

## 2013-12-20 ENCOUNTER — Ambulatory Visit: Payer: No Typology Code available for payment source

## 2013-12-20 LAB — CULTURE, BLOOD (ROUTINE X 2)
CULTURE: NO GROWTH
CULTURE: NO GROWTH

## 2013-12-23 ENCOUNTER — Ambulatory Visit: Payer: No Typology Code available for payment source

## 2013-12-24 ENCOUNTER — Encounter: Payer: Self-pay | Admitting: Nutrition

## 2013-12-24 ENCOUNTER — Ambulatory Visit: Payer: Self-pay | Admitting: Internal Medicine

## 2013-12-24 ENCOUNTER — Ambulatory Visit: Payer: No Typology Code available for payment source

## 2013-12-24 ENCOUNTER — Telehealth: Payer: Self-pay | Admitting: *Deleted

## 2013-12-24 ENCOUNTER — Other Ambulatory Visit: Payer: No Typology Code available for payment source

## 2013-12-24 ENCOUNTER — Telehealth: Payer: Self-pay

## 2013-12-24 ENCOUNTER — Ambulatory Visit: Payer: Self-pay

## 2013-12-24 NOTE — Telephone Encounter (Signed)
Per Dr Vista Mink, pt was late for lab/ MD/ chemo today, pt needs to r/s, okay to r/s to 1st avail next week.  onc tx schedule filled out

## 2013-12-24 NOTE — Telephone Encounter (Signed)
Patient was scheduled for 9am labs and 9:30 MD visit.  Patient came in at 10:15. Per Dr Earlie Server patient needs to reschedule.  Patient then called triage to report chest pain.  Per Dr Earlie Server, patient needs to go to the ER to be evaluated.

## 2013-12-25 ENCOUNTER — Telehealth: Payer: Self-pay | Admitting: Internal Medicine

## 2013-12-25 ENCOUNTER — Ambulatory Visit: Payer: No Typology Code available for payment source

## 2013-12-25 NOTE — Telephone Encounter (Signed)
Gave update cal with appt Nov thru Dec 1st.

## 2013-12-26 ENCOUNTER — Ambulatory Visit: Payer: No Typology Code available for payment source

## 2013-12-27 ENCOUNTER — Ambulatory Visit: Payer: No Typology Code available for payment source

## 2013-12-30 ENCOUNTER — Ambulatory Visit (HOSPITAL_BASED_OUTPATIENT_CLINIC_OR_DEPARTMENT_OTHER): Payer: No Typology Code available for payment source

## 2013-12-30 ENCOUNTER — Encounter: Payer: Self-pay | Admitting: Internal Medicine

## 2013-12-30 ENCOUNTER — Other Ambulatory Visit: Payer: Self-pay | Admitting: Medical Oncology

## 2013-12-30 ENCOUNTER — Telehealth: Payer: Self-pay | Admitting: Internal Medicine

## 2013-12-30 ENCOUNTER — Telehealth: Payer: Self-pay | Admitting: *Deleted

## 2013-12-30 ENCOUNTER — Ambulatory Visit: Payer: No Typology Code available for payment source

## 2013-12-30 ENCOUNTER — Ambulatory Visit (HOSPITAL_BASED_OUTPATIENT_CLINIC_OR_DEPARTMENT_OTHER): Payer: No Typology Code available for payment source | Admitting: Internal Medicine

## 2013-12-30 ENCOUNTER — Other Ambulatory Visit (HOSPITAL_BASED_OUTPATIENT_CLINIC_OR_DEPARTMENT_OTHER): Payer: No Typology Code available for payment source

## 2013-12-30 VITALS — BP 137/78 | HR 75 | Temp 98.2°F | Resp 17 | Ht 62.0 in | Wt 122.7 lb

## 2013-12-30 DIAGNOSIS — R630 Anorexia: Secondary | ICD-10-CM

## 2013-12-30 DIAGNOSIS — C169 Malignant neoplasm of stomach, unspecified: Secondary | ICD-10-CM

## 2013-12-30 DIAGNOSIS — Z5111 Encounter for antineoplastic chemotherapy: Secondary | ICD-10-CM

## 2013-12-30 DIAGNOSIS — C163 Malignant neoplasm of pyloric antrum: Secondary | ICD-10-CM

## 2013-12-30 LAB — CBC WITH DIFFERENTIAL/PLATELET
BASO%: 1.1 % (ref 0.0–2.0)
Basophils Absolute: 0.1 10*3/uL (ref 0.0–0.1)
EOS ABS: 0 10*3/uL (ref 0.0–0.5)
EOS%: 0.7 % (ref 0.0–7.0)
HCT: 36.1 % (ref 34.8–46.6)
HGB: 11.4 g/dL — ABNORMAL LOW (ref 11.6–15.9)
LYMPH%: 32.1 % (ref 14.0–49.7)
MCH: 25.9 pg (ref 25.1–34.0)
MCHC: 31.5 g/dL (ref 31.5–36.0)
MCV: 82.3 fL (ref 79.5–101.0)
MONO#: 0.6 10*3/uL (ref 0.1–0.9)
MONO%: 9 % (ref 0.0–14.0)
NEUT#: 3.7 10*3/uL (ref 1.5–6.5)
NEUT%: 57.1 % (ref 38.4–76.8)
PLATELETS: 311 10*3/uL (ref 145–400)
RBC: 4.39 10*6/uL (ref 3.70–5.45)
RDW: 23.2 % — ABNORMAL HIGH (ref 11.2–14.5)
WBC: 6.4 10*3/uL (ref 3.9–10.3)
lymph#: 2.1 10*3/uL (ref 0.9–3.3)

## 2013-12-30 LAB — COMPREHENSIVE METABOLIC PANEL (CC13)
ALK PHOS: 84 U/L (ref 40–150)
ALT: 21 U/L (ref 0–55)
ANION GAP: 10 meq/L (ref 3–11)
AST: 23 U/L (ref 5–34)
Albumin: 3.3 g/dL — ABNORMAL LOW (ref 3.5–5.0)
BUN: 10.7 mg/dL (ref 7.0–26.0)
CO2: 28 meq/L (ref 22–29)
CREATININE: 1 mg/dL (ref 0.6–1.1)
Calcium: 9.6 mg/dL (ref 8.4–10.4)
Chloride: 102 mEq/L (ref 98–109)
GLUCOSE: 107 mg/dL (ref 70–140)
Potassium: 4.4 mEq/L (ref 3.5–5.1)
Sodium: 140 mEq/L (ref 136–145)
Total Bilirubin: 0.77 mg/dL (ref 0.20–1.20)
Total Protein: 6.7 g/dL (ref 6.4–8.3)

## 2013-12-30 MED ORDER — LEUCOVORIN CALCIUM INJECTION 350 MG
500.0000 mg/m2 | Freq: Once | INTRAVENOUS | Status: AC
  Administered 2013-12-30: 810 mg via INTRAVENOUS
  Filled 2013-12-30: qty 40.5

## 2013-12-30 MED ORDER — PROCHLORPERAZINE MALEATE 10 MG PO TABS
ORAL_TABLET | ORAL | Status: AC
  Filled 2013-12-30: qty 1

## 2013-12-30 MED ORDER — SODIUM CHLORIDE 0.9 % IJ SOLN
10.0000 mL | INTRAMUSCULAR | Status: DC | PRN
  Administered 2013-12-30: 10 mL
  Filled 2013-12-30: qty 10

## 2013-12-30 MED ORDER — FLUOROURACIL CHEMO INJECTION 2.5 GM/50ML
600.0000 mg/m2 | Freq: Once | INTRAVENOUS | Status: AC
  Administered 2013-12-30: 950 mg via INTRAVENOUS
  Filled 2013-12-30: qty 19

## 2013-12-30 MED ORDER — HEPARIN SOD (PORK) LOCK FLUSH 100 UNIT/ML IV SOLN
500.0000 [IU] | Freq: Once | INTRAVENOUS | Status: AC | PRN
  Administered 2013-12-30: 500 [IU]
  Filled 2013-12-30: qty 5

## 2013-12-30 MED ORDER — METHYLPREDNISOLONE (PAK) 4 MG PO TABS: ORAL_TABLET | ORAL | Status: DC

## 2013-12-30 MED ORDER — PROCHLORPERAZINE MALEATE 10 MG PO TABS
10.0000 mg | ORAL_TABLET | Freq: Once | ORAL | Status: AC
  Administered 2013-12-30: 10 mg via ORAL

## 2013-12-30 MED ORDER — SODIUM CHLORIDE 0.9 % IV SOLN
Freq: Once | INTRAVENOUS | Status: AC
  Administered 2013-12-30: 10:00:00 via INTRAVENOUS

## 2013-12-30 NOTE — Telephone Encounter (Signed)
Gave avs & cal for Nov/Dec. Sent mess to sch [place].

## 2013-12-30 NOTE — Patient Instructions (Signed)
[facility] Discharge Instructions for Patients Receiving Chemotherapy  Today you received the following chemotherapy agents Leucovorin, 5FU  To help prevent nausea and vomiting after your treatment, we encourage you to take your nausea medication as directed.   If you develop nausea and vomiting that is not controlled by your nausea medication, call the clinic.   BELOW ARE SYMPTOMS THAT SHOULD BE REPORTED IMMEDIATELY:  *FEVER GREATER THAN 100.5 F  *CHILLS WITH OR WITHOUT FEVER  NAUSEA AND VOMITING THAT IS NOT CONTROLLED WITH YOUR NAUSEA MEDICATION  *UNUSUAL SHORTNESS OF BREATH  *UNUSUAL BRUISING OR BLEEDING  TENDERNESS IN MOUTH AND THROAT WITH OR WITHOUT PRESENCE OF ULCERS  *URINARY PROBLEMS  *BOWEL PROBLEMS  UNUSUAL RASH Items with * indicate a potential emergency and should be followed up as soon as possible.  Feel free to call the clinic you have any questions or concerns. The clinic phone number is [phone].

## 2013-12-30 NOTE — Telephone Encounter (Signed)
Per staff message and POF I have scheduled appts. Advised scheduler of appts. JMW  

## 2013-12-30 NOTE — Progress Notes (Signed)
[facility] [phone]   [phone]  OFFICE PROGRESS NOTE  Arnoldo Morale, MD [address] [place] [address]  DIAGNOSIS: Stage IIA (T1a, N2, M0) gastric adenocarcinoma diagnosed in July of 2015  PRIOR [place] post diagnostic laparoscopy, distal gastrectomy with billroth II anastamosis and feeding gastrojejunostomy tube under the care of Dr. Barry Dienes on 09/20/2013.   CURRENT THERAPY: Adjuvant systemic chemotherapy with 5-FU 600 mg/M2 with leucovorin on days 1, 8, 15, 22, 29 and 36 every 8 weeks. First dose 11/19/2013.  INTERVAL HISTORY: Paula [name] 48 y.o. female returns to the clinic today for hospital followup visit. She is currently undergoing adjuvant chemotherapy with 5-FU and leucovorin status post 4 weeks of treatment and tolerating her treatment fairly well. The J-tube was removed and the patient is eating regularly but she has lack of appetite. She denied having any significant fever or chills, no nausea or vomiting. The patient has occasional right-sided chest pain especially with cough. She denied having any significant shortness of breath or hemoptysis. She has no significant weight loss or night sweats.  MEDICAL HISTORY: Past Medical History  Diagnosis Date  . Headache(784.0) 2011    Migraines  . Esophageal reflux     On omeprazole  . Costochondritis   . Hypertension     started around 2011  . Ovarian cyst   . Stomach cancer   . Family history of cancer   . Heart murmur     ALLERGIES:  has No Known Allergies.  MEDICATIONS:  Current Outpatient Prescriptions  Medication Sig Dispense Refill  . carvedilol (COREG) 6.25 MG tablet Take 6.25 mg by mouth 2 (two) times daily with a meal.    . fentaNYL (DURAGESIC - DOSED MCG/HR) 25 MCG/HR patch Place 1 patch (25 mcg total) onto the skin every 3 (three) days. 10 patch 0  . lidocaine (LIDODERM) 5 % 1 patch daily as needed.    . lidocaine-prilocaine  (EMLA) cream Apply 1 application topically as needed. Apply to port 1 hour before chemo 30 g 0  . LORazepam (ATIVAN) 1 MG tablet Take 1 mg by mouth 3 (three) times daily.    . metoCLOPramide (REGLAN) 10 MG tablet Take 1 tablet (10 mg total) by mouth 4 (four) times daily. 120 tablet 3  . Multiple Vitamins-Minerals (MULTIVITAMIN & MINERAL PO) Take 1 tablet by mouth daily.    . pantoprazole (PROTONIX) 40 MG tablet Take 1 tablet (40 mg total) by mouth 2 (two) times daily before a meal. 60 tablet 3  . Nutritional Supplements (ENSURE COMPLETE PO) Take 237 mLs by mouth daily. Takes a little at a time, in order to keep it down.    Marland Kitchen OLANZapine zydis (ZYPREXA) 5 MG disintegrating tablet Take 1 tablet (5 mg total) by mouth at bedtime. 30 tablet 3  . ondansetron (ZOFRAN-ODT) 8 MG disintegrating tablet Take 1 tablet (8 mg total) by mouth every 8 (eight) hours as needed for nausea or vomiting. 20 tablet 0   No current facility-administered medications for this visit.    SURGICAL HISTORY:  Past Surgical History  Procedure Laterality Date  . Oophorectomy      Around 1985 left ovary removal  . Tonsillectomy      Around 1994  . Tubal ligation    . Esophagogastroduodenoscopy N/A 09/16/2013    Procedure: ESOPHAGOGASTRODUODENOSCOPY (EGD);  Surgeon: Wonda Horner, MD;  Location: [facility];  Service: Endoscopy;  Laterality: N/A;  . Laparoscopy N/A  09/20/2013    Procedure: DIAGNOSTIC LAPAROSCOPY,DISTAL GASTRECTOMY AND FEEDING GASTROJEJUNOSTOMY;  Surgeon: [name], MD;  Location: [facility];  Service: General;  Laterality: N/A;  . Portacath placement Left 11/18/2013    Procedure: INSERTION PORT-A-CATH;  Surgeon: [name], MD;  Location: [facility];  Service: General;  Laterality: Left;    REVIEW OF SYSTEMS:  Constitutional: positive for anorexia, fatigue and weight loss Eyes: negative Ears, nose, mouth, throat, and face: negative Respiratory: negative Cardiovascular: negative Gastrointestinal: positive for  nausea Genitourinary:negative Integument/breast: negative Hematologic/lymphatic: negative Musculoskeletal:negative Neurological: negative Behavioral/Psych: negative Endocrine: negative Allergic/Immunologic: negative   PHYSICAL EXAMINATION: General appearance: alert, cooperative, fatigued and no distress Head: Normocephalic, without obvious abnormality, atraumatic Neck: no adenopathy, no JVD, supple, symmetrical, trachea midline and thyroid not enlarged, symmetric, no tenderness/mass/nodules Lymph nodes: Cervical, supraclavicular, and axillary nodes normal. Resp: clear to auscultation bilaterally Back: symmetric, no curvature. ROM normal. No CVA tenderness. Cardio: regular rate and rhythm, S1, S2 normal, no murmur, click, rub or gallop GI: soft, non-tender; bowel sounds normal; no masses,  no organomegaly Extremities: extremities normal, atraumatic, no cyanosis or edema Neurologic: Alert and oriented X 3, normal strength and tone. Normal symmetric reflexes. Normal coordination and gait  ECOG PERFORMANCE STATUS: 1 - Symptomatic but completely ambulatory  Blood pressure 137/78, pulse 75, temperature 98.2 F (36.8 C), temperature source Oral, resp. rate 17, height 5\' 2"  (1.575 m), weight 122 lb 11.2 oz (55.656 kg), SpO2 99 %.  LABORATORY DATA: Lab Results  Component Value Date   WBC 6.4 12/30/2013   HGB 11.4* 12/30/2013   HCT 36.1 12/30/2013   MCV 82.3 12/30/2013   PLT 311 12/30/2013      Chemistry      Component Value Date/Time   NA 140 12/30/2013 0812   NA 137 12/13/2013 2359   K 4.4 12/30/2013 0812   K 3.8 12/13/2013 2359   CL 103 12/13/2013 2359   CO2 28 12/30/2013 0812   CO2 25 12/13/2013 2359   BUN 10.7 12/30/2013 0812   BUN 10 12/13/2013 2359   CREATININE 1.0 12/30/2013 0812   CREATININE 0.91 12/13/2013 2359      Component Value Date/Time   CALCIUM 9.6 12/30/2013 0812   CALCIUM 8.3* 12/13/2013 2359   ALKPHOS 84 12/30/2013 0812   ALKPHOS 109 11/11/2013 1415    AST 23 12/30/2013 0812   AST 27 11/11/2013 1415   ALT 21 12/30/2013 0812   ALT 75* 11/11/2013 1415   BILITOT 0.77 12/30/2013 0812   BILITOT 0.4 11/11/2013 1415       RADIOGRAPHIC STUDIES:  ASSESSMENT AND PLAN: this is a very pleasant 48 years old [place] American female recently diagnosed with a stage IIA gastric adenocarcinoma status post resection with lymph node dissection. She is currently undergoing adjuvant chemotherapy with 5-FU and leucovorin and tolerating her treatment fairly well. I recommended for the patient to proceed with the last 2 weeks of the first cycle as scheduled. She will come back for follow-up visit on 01/27/2014 with the start of cycle #2. She was advised to call immediately if she has any concerning symptoms in the interval.  The patient voices understanding of current disease status and treatment options and is in agreement with the current care plan.  All questions were answered. The patient knows to call the clinic with any problems, questions or concerns. We can certainly see the patient much sooner if necessary.  Disclaimer: This note was dictated with voice recognition software. Similar sounding words can inadvertently be transcribed and  may not be corrected upon review.

## 2013-12-31 ENCOUNTER — Other Ambulatory Visit: Payer: Self-pay

## 2013-12-31 ENCOUNTER — Ambulatory Visit: Payer: No Typology Code available for payment source

## 2014-01-01 ENCOUNTER — Ambulatory Visit: Payer: No Typology Code available for payment source

## 2014-01-06 ENCOUNTER — Ambulatory Visit (HOSPITAL_BASED_OUTPATIENT_CLINIC_OR_DEPARTMENT_OTHER): Payer: No Typology Code available for payment source

## 2014-01-06 ENCOUNTER — Other Ambulatory Visit (HOSPITAL_BASED_OUTPATIENT_CLINIC_OR_DEPARTMENT_OTHER): Payer: No Typology Code available for payment source

## 2014-01-06 DIAGNOSIS — C163 Malignant neoplasm of pyloric antrum: Secondary | ICD-10-CM

## 2014-01-06 DIAGNOSIS — Z5111 Encounter for antineoplastic chemotherapy: Secondary | ICD-10-CM

## 2014-01-06 DIAGNOSIS — C169 Malignant neoplasm of stomach, unspecified: Secondary | ICD-10-CM

## 2014-01-06 LAB — CBC WITH DIFFERENTIAL/PLATELET
BASO%: 0.4 % (ref 0.0–2.0)
BASOS ABS: 0 10*3/uL (ref 0.0–0.1)
EOS ABS: 0.2 10*3/uL (ref 0.0–0.5)
EOS%: 3.8 % (ref 0.0–7.0)
HCT: 32.5 % — ABNORMAL LOW (ref 34.8–46.6)
HEMOGLOBIN: 10.3 g/dL — AB (ref 11.6–15.9)
LYMPH#: 2.1 10*3/uL (ref 0.9–3.3)
LYMPH%: 43.9 % (ref 14.0–49.7)
MCH: 26.3 pg (ref 25.1–34.0)
MCHC: 31.7 g/dL (ref 31.5–36.0)
MCV: 83.1 fL (ref 79.5–101.0)
MONO#: 0.2 10*3/uL (ref 0.1–0.9)
MONO%: 3.4 % (ref 0.0–14.0)
NEUT#: 2.3 10*3/uL (ref 1.5–6.5)
NEUT%: 48.5 % (ref 38.4–76.8)
Platelets: 217 10*3/uL (ref 145–400)
RBC: 3.91 10*6/uL (ref 3.70–5.45)
RDW: 21.8 % — ABNORMAL HIGH (ref 11.2–14.5)
WBC: 4.7 10*3/uL (ref 3.9–10.3)
nRBC: 0 % (ref 0–0)

## 2014-01-06 LAB — COMPREHENSIVE METABOLIC PANEL (CC13)
ALBUMIN: 3.3 g/dL — AB (ref 3.5–5.0)
ALT: 20 U/L (ref 0–55)
AST: 17 U/L (ref 5–34)
Alkaline Phosphatase: 69 U/L (ref 40–150)
Anion Gap: 7 mEq/L (ref 3–11)
BUN: 9.5 mg/dL (ref 7.0–26.0)
CHLORIDE: 106 meq/L (ref 98–109)
CO2: 27 mEq/L (ref 22–29)
Calcium: 9.2 mg/dL (ref 8.4–10.4)
Creatinine: 1 mg/dL (ref 0.6–1.1)
GLUCOSE: 112 mg/dL (ref 70–140)
Potassium: 4.4 mEq/L (ref 3.5–5.1)
Sodium: 140 mEq/L (ref 136–145)
Total Bilirubin: 0.66 mg/dL (ref 0.20–1.20)
Total Protein: 6.5 g/dL (ref 6.4–8.3)

## 2014-01-06 MED ORDER — PROCHLORPERAZINE MALEATE 10 MG PO TABS
ORAL_TABLET | ORAL | Status: AC
  Filled 2014-01-06: qty 1

## 2014-01-06 MED ORDER — HEPARIN SOD (PORK) LOCK FLUSH 100 UNIT/ML IV SOLN
500.0000 [IU] | Freq: Once | INTRAVENOUS | Status: DC | PRN
  Filled 2014-01-06: qty 5

## 2014-01-06 MED ORDER — DEXTROSE 5 % IV SOLN
500.0000 mg/m2 | Freq: Once | INTRAVENOUS | Status: AC
  Administered 2014-01-06: 810 mg via INTRAVENOUS
  Filled 2014-01-06: qty 40.5

## 2014-01-06 MED ORDER — PROCHLORPERAZINE MALEATE 10 MG PO TABS
10.0000 mg | ORAL_TABLET | Freq: Once | ORAL | Status: AC
  Administered 2014-01-06: 10 mg via ORAL

## 2014-01-06 MED ORDER — SODIUM CHLORIDE 0.9 % IJ SOLN
10.0000 mL | INTRAMUSCULAR | Status: DC | PRN
  Filled 2014-01-06: qty 10

## 2014-01-06 MED ORDER — FLUOROURACIL CHEMO INJECTION 2.5 GM/50ML
600.0000 mg/m2 | Freq: Once | INTRAVENOUS | Status: AC
  Administered 2014-01-06: 950 mg via INTRAVENOUS
  Filled 2014-01-06: qty 19

## 2014-01-06 MED ORDER — SODIUM CHLORIDE 0.9 % IV SOLN
Freq: Once | INTRAVENOUS | Status: AC
  Administered 2014-01-06: 09:00:00 via INTRAVENOUS

## 2014-01-06 NOTE — Patient Instructions (Addendum)
[facility] Discharge Instructions for Patients Receiving Chemotherapy  Today you received the following chemotherapy agents Leucovorin, 5FU,  To help prevent nausea and vomiting after your treatment, we encourage you to take your nausea medication as directed.   If you develop nausea and vomiting that is not controlled by your nausea medication, call the clinic.   BELOW ARE SYMPTOMS THAT SHOULD BE REPORTED IMMEDIATELY:  *FEVER GREATER THAN 100.5 F  *CHILLS WITH OR WITHOUT FEVER  NAUSEA AND VOMITING THAT IS NOT CONTROLLED WITH YOUR NAUSEA MEDICATION  *UNUSUAL SHORTNESS OF BREATH  *UNUSUAL BRUISING OR BLEEDING  TENDERNESS IN MOUTH AND THROAT WITH OR WITHOUT PRESENCE OF ULCERS  *URINARY PROBLEMS  *BOWEL PROBLEMS  UNUSUAL RASH Items with * indicate a potential emergency and should be followed up as soon as possible.  Feel free to call the clinic you have any questions or concerns. The clinic phone number is [phone].

## 2014-01-07 ENCOUNTER — Other Ambulatory Visit: Payer: Self-pay

## 2014-01-09 ENCOUNTER — Other Ambulatory Visit: Payer: Self-pay | Admitting: Internal Medicine

## 2014-01-10 ENCOUNTER — Telehealth: Payer: Self-pay | Admitting: Medical Oncology

## 2014-01-10 DIAGNOSIS — C169 Malignant neoplasm of stomach, unspecified: Secondary | ICD-10-CM

## 2014-01-10 MED ORDER — FENTANYL 25 MCG/HR TD PT72: 25.0000 ug | MEDICATED_PATCH | TRANSDERMAL | Status: DC

## 2014-01-10 MED ORDER — PANTOPRAZOLE SODIUM 40 MG PO TBEC: 40.0000 mg | DELAYED_RELEASE_TABLET | Freq: Two times a day (BID) | ORAL | Status: DC

## 2014-01-10 MED ORDER — LORAZEPAM 1 MG PO TABS: 1.0000 mg | ORAL_TABLET | Freq: Three times a day (TID) | ORAL | Status: DC | PRN

## 2014-01-10 NOTE — Telephone Encounter (Signed)
Called in rx for lorazepam

## 2014-01-14 ENCOUNTER — Ambulatory Visit: Payer: Self-pay

## 2014-01-14 ENCOUNTER — Other Ambulatory Visit (HOSPITAL_BASED_OUTPATIENT_CLINIC_OR_DEPARTMENT_OTHER): Payer: No Typology Code available for payment source

## 2014-01-14 DIAGNOSIS — C163 Malignant neoplasm of pyloric antrum: Secondary | ICD-10-CM

## 2014-01-14 DIAGNOSIS — C169 Malignant neoplasm of stomach, unspecified: Secondary | ICD-10-CM

## 2014-01-14 LAB — CBC WITH DIFFERENTIAL/PLATELET
BASO%: 0.3 % (ref 0.0–2.0)
Basophils Absolute: 0 10*3/uL (ref 0.0–0.1)
EOS%: 7.3 % — ABNORMAL HIGH (ref 0.0–7.0)
Eosinophils Absolute: 0.3 10*3/uL (ref 0.0–0.5)
HCT: 30.1 % — ABNORMAL LOW (ref 34.8–46.6)
HGB: 9.5 g/dL — ABNORMAL LOW (ref 11.6–15.9)
LYMPH%: 54.2 % — ABNORMAL HIGH (ref 14.0–49.7)
MCH: 26.1 pg (ref 25.1–34.0)
MCHC: 31.6 g/dL (ref 31.5–36.0)
MCV: 82.7 fL (ref 79.5–101.0)
MONO#: 0 10*3/uL — ABNORMAL LOW (ref 0.1–0.9)
MONO%: 1 % (ref 0.0–14.0)
NEUT#: 1.4 10*3/uL — ABNORMAL LOW (ref 1.5–6.5)
NEUT%: 37.2 % — ABNORMAL LOW (ref 38.4–76.8)
Platelets: 155 10*3/uL (ref 145–400)
RBC: 3.64 10*6/uL — ABNORMAL LOW (ref 3.70–5.45)
RDW: 21 % — ABNORMAL HIGH (ref 11.2–14.5)
WBC: 3.8 10*3/uL — ABNORMAL LOW (ref 3.9–10.3)
lymph#: 2.1 10*3/uL (ref 0.9–3.3)

## 2014-01-14 LAB — COMPREHENSIVE METABOLIC PANEL (CC13)
ALBUMIN: 3.8 g/dL (ref 3.5–5.0)
ALK PHOS: 79 U/L (ref 40–150)
ALT: 16 U/L (ref 0–55)
AST: 15 U/L (ref 5–34)
Anion Gap: 8 mEq/L (ref 3–11)
BUN: 10.6 mg/dL (ref 7.0–26.0)
CO2: 26 mEq/L (ref 22–29)
Calcium: 9.4 mg/dL (ref 8.4–10.4)
Chloride: 106 mEq/L (ref 98–109)
Creatinine: 0.9 mg/dL (ref 0.6–1.1)
GLUCOSE: 97 mg/dL (ref 70–140)
POTASSIUM: 4.1 meq/L (ref 3.5–5.1)
Sodium: 140 mEq/L (ref 136–145)
Total Bilirubin: 1.05 mg/dL (ref 0.20–1.20)
Total Protein: 6.8 g/dL (ref 6.4–8.3)

## 2014-01-20 ENCOUNTER — Telehealth: Payer: Self-pay | Admitting: *Deleted

## 2014-01-20 ENCOUNTER — Other Ambulatory Visit: Payer: Self-pay | Admitting: Internal Medicine

## 2014-01-20 NOTE — Telephone Encounter (Signed)
Pt called wanting to know what she should do for a runny nose.  No fever or productive cough.  Per Dr Vista Mink, okay for her to take something OTC like zyrtec, she verbalized understanding

## 2014-01-21 ENCOUNTER — Other Ambulatory Visit (HOSPITAL_BASED_OUTPATIENT_CLINIC_OR_DEPARTMENT_OTHER): Payer: No Typology Code available for payment source

## 2014-01-21 ENCOUNTER — Ambulatory Visit: Payer: Self-pay

## 2014-01-21 DIAGNOSIS — C163 Malignant neoplasm of pyloric antrum: Secondary | ICD-10-CM

## 2014-01-21 DIAGNOSIS — C169 Malignant neoplasm of stomach, unspecified: Secondary | ICD-10-CM

## 2014-01-21 LAB — COMPREHENSIVE METABOLIC PANEL (CC13)
ALK PHOS: 87 U/L (ref 40–150)
ALT: 30 U/L (ref 0–55)
AST: 23 U/L (ref 5–34)
Albumin: 3.7 g/dL (ref 3.5–5.0)
Anion Gap: 8 mEq/L (ref 3–11)
BUN: 11 mg/dL (ref 7.0–26.0)
CO2: 30 mEq/L — ABNORMAL HIGH (ref 22–29)
Calcium: 9.4 mg/dL (ref 8.4–10.4)
Chloride: 104 mEq/L (ref 98–109)
Creatinine: 0.9 mg/dL (ref 0.6–1.1)
Glucose: 105 mg/dl (ref 70–140)
Potassium: 4.5 mEq/L (ref 3.5–5.1)
Sodium: 142 mEq/L (ref 136–145)
Total Bilirubin: 0.56 mg/dL (ref 0.20–1.20)
Total Protein: 6.4 g/dL (ref 6.4–8.3)

## 2014-01-21 LAB — CBC WITH DIFFERENTIAL/PLATELET
BASO%: 0.3 % (ref 0.0–2.0)
BASOS ABS: 0 10*3/uL (ref 0.0–0.1)
EOS%: 4.2 % (ref 0.0–7.0)
Eosinophils Absolute: 0.1 10*3/uL (ref 0.0–0.5)
HEMATOCRIT: 30 % — AB (ref 34.8–46.6)
HEMOGLOBIN: 9.5 g/dL — AB (ref 11.6–15.9)
LYMPH%: 66.6 % — AB (ref 14.0–49.7)
MCH: 27.2 pg (ref 25.1–34.0)
MCHC: 31.7 g/dL (ref 31.5–36.0)
MCV: 86 fL (ref 79.5–101.0)
MONO#: 0.2 10*3/uL (ref 0.1–0.9)
MONO%: 6.6 % (ref 0.0–14.0)
NEUT#: 0.6 10*3/uL — ABNORMAL LOW (ref 1.5–6.5)
NEUT%: 22.3 % — AB (ref 38.4–76.8)
Platelets: 157 10*3/uL (ref 145–400)
RBC: 3.49 10*6/uL — ABNORMAL LOW (ref 3.70–5.45)
RDW: 22.9 % — ABNORMAL HIGH (ref 11.2–14.5)
WBC: 2.9 10*3/uL — ABNORMAL LOW (ref 3.9–10.3)
lymph#: 1.9 10*3/uL (ref 0.9–3.3)

## 2014-01-27 ENCOUNTER — Other Ambulatory Visit: Payer: Self-pay | Admitting: Medical Oncology

## 2014-01-27 ENCOUNTER — Ambulatory Visit: Payer: No Typology Code available for payment source

## 2014-01-27 ENCOUNTER — Other Ambulatory Visit (HOSPITAL_BASED_OUTPATIENT_CLINIC_OR_DEPARTMENT_OTHER): Payer: No Typology Code available for payment source

## 2014-01-27 ENCOUNTER — Ambulatory Visit (HOSPITAL_BASED_OUTPATIENT_CLINIC_OR_DEPARTMENT_OTHER): Payer: No Typology Code available for payment source | Admitting: Internal Medicine

## 2014-01-27 ENCOUNTER — Telehealth: Payer: Self-pay | Admitting: Medical Oncology

## 2014-01-27 ENCOUNTER — Encounter: Payer: Self-pay | Admitting: Internal Medicine

## 2014-01-27 ENCOUNTER — Telehealth: Payer: Self-pay | Admitting: Internal Medicine

## 2014-01-27 DIAGNOSIS — C169 Malignant neoplasm of stomach, unspecified: Secondary | ICD-10-CM

## 2014-01-27 DIAGNOSIS — D701 Agranulocytosis secondary to cancer chemotherapy: Secondary | ICD-10-CM

## 2014-01-27 DIAGNOSIS — D61818 Other pancytopenia: Secondary | ICD-10-CM

## 2014-01-27 LAB — CBC WITH DIFFERENTIAL/PLATELET
BASO%: 0.8 % (ref 0.0–2.0)
Basophils Absolute: 0 10*3/uL (ref 0.0–0.1)
EOS ABS: 0.1 10*3/uL (ref 0.0–0.5)
EOS%: 2.7 % (ref 0.0–7.0)
HCT: 32.9 % — ABNORMAL LOW (ref 34.8–46.6)
HEMOGLOBIN: 10.3 g/dL — AB (ref 11.6–15.9)
LYMPH%: 73.8 % — ABNORMAL HIGH (ref 14.0–49.7)
MCH: 27.7 pg (ref 25.1–34.0)
MCHC: 31.3 g/dL — ABNORMAL LOW (ref 31.5–36.0)
MCV: 88.4 fL (ref 79.5–101.0)
MONO#: 0.5 10*3/uL (ref 0.1–0.9)
MONO%: 18.8 % — AB (ref 0.0–14.0)
NEUT#: 0.1 10*3/uL — CL (ref 1.5–6.5)
NEUT%: 3.9 % — ABNORMAL LOW (ref 38.4–76.8)
Platelets: 236 10*3/uL (ref 145–400)
RBC: 3.72 10*6/uL (ref 3.70–5.45)
RDW: 25.1 % — AB (ref 11.2–14.5)
WBC: 2.6 10*3/uL — ABNORMAL LOW (ref 3.9–10.3)
lymph#: 1.9 10*3/uL (ref 0.9–3.3)

## 2014-01-27 LAB — COMPREHENSIVE METABOLIC PANEL (CC13)
ALBUMIN: 3.9 g/dL (ref 3.5–5.0)
ALT: 62 U/L — ABNORMAL HIGH (ref 0–55)
ANION GAP: 10 meq/L (ref 3–11)
AST: 54 U/L — ABNORMAL HIGH (ref 5–34)
Alkaline Phosphatase: 90 U/L (ref 40–150)
BUN: 8.8 mg/dL (ref 7.0–26.0)
CO2: 25 meq/L (ref 22–29)
Calcium: 9.5 mg/dL (ref 8.4–10.4)
Chloride: 105 mEq/L (ref 98–109)
Creatinine: 0.9 mg/dL (ref 0.6–1.1)
EGFR: 85 mL/min/{1.73_m2} — AB (ref >90)
GLUCOSE: 97 mg/dL (ref 70–140)
POTASSIUM: 4.3 meq/L (ref 3.5–5.1)
Sodium: 140 mEq/L (ref 136–145)
Total Bilirubin: 0.75 mg/dL (ref 0.20–1.20)
Total Protein: 6.9 g/dL (ref 6.4–8.3)

## 2014-01-27 MED ORDER — TBO-FILGRASTIM 300 MCG/0.5ML ~~LOC~~ SOSY
300.0000 ug | PREFILLED_SYRINGE | Freq: Once | SUBCUTANEOUS | Status: AC
  Administered 2014-01-27: 300 ug via SUBCUTANEOUS
  Filled 2014-01-27: qty 0.5

## 2014-01-27 NOTE — Progress Notes (Signed)
Paula [name] Telephone:([phone]   Fax:(336) [phone]  OFFICE PROGRESS NOTE  Arnoldo Morale, MD [address] [place] [address]  DIAGNOSIS: Stage IIA (T1a, N2, M0) gastric adenocarcinoma diagnosed in July of 2015  PRIOR [place] post diagnostic laparoscopy, distal gastrectomy with billroth II anastamosis and feeding gastrojejunostomy tube under the care of Dr. Barry Dienes on 09/20/2013.   CURRENT THERAPY: Adjuvant systemic chemotherapy with 5-FU 600 mg/M2 with leucovorin on days 1, 8, 15, 22, 29 and 36 every 8 weeks. First dose 11/19/2013.  INTERVAL HISTORY: Paula [name] 48 y.o. female returns to the clinic today for hospital followup visit. She is currently undergoing adjuvant chemotherapy with 5-FU and leucovorin status post 6 weeks of treatment and tolerating her treatment fairly well. She has been off treatment for the last 2 weeks. She is here today to resume the second cycle of her systemic chemotherapy with 5-FU and leucovorin. She denied having any significant fever or chills, no nausea or vomiting. The patient has occasional right-sided chest pain especially with cough. She denied having any significant shortness of breath or hemoptysis. She has no significant weight loss or night sweats. Her absolute neutrophil count has been low recently. She is wondering about going back to work.  MEDICAL HISTORY: Past Medical History  Diagnosis Date  . Headache(784.0) 2011    Migraines  . Esophageal reflux     On omeprazole  . Costochondritis   . Hypertension     started around 2011  . Ovarian cyst   . Stomach cancer   . Family history of cancer   . Heart murmur     ALLERGIES:  has No Known Allergies.  MEDICATIONS:  Current Outpatient Prescriptions  Medication Sig Dispense Refill  . carvedilol (COREG) 6.25 MG tablet Take 6.25 mg by mouth 2 (two) times daily with a meal.    . fentaNYL (DURAGESIC - DOSED MCG/HR) 25 MCG/HR patch Place  1 patch (25 mcg total) onto the skin every 3 (three) days. 10 patch 0  . lidocaine (LIDODERM) 5 % 1 patch daily as needed.    . lidocaine-prilocaine (EMLA) cream Apply 1 application topically as needed. Apply to port 1 hour before chemo 30 g 0  . LORazepam (ATIVAN) 1 MG tablet Take 1 tablet (1 mg total) by mouth every 8 (eight) hours as needed for anxiety. 30 tablet 0  . metoCLOPramide (REGLAN) 10 MG tablet Take 1 tablet (10 mg total) by mouth 4 (four) times daily. 120 tablet 3  . Multiple Vitamins-Minerals (MULTIVITAMIN & MINERAL PO) Take 1 tablet by mouth daily.    . Nutritional Supplements (ENSURE COMPLETE PO) Take 237 mLs by mouth daily. Takes a little at a time, in order to keep it down.    . pantoprazole (PROTONIX) 40 MG tablet Take 1 tablet (40 mg total) by mouth 2 (two) times daily before a meal. 60 tablet 3  . OLANZapine zydis (ZYPREXA) 5 MG disintegrating tablet Take 1 tablet (5 mg total) by mouth at bedtime. (Patient not taking: Reported on 01/27/2014) 30 tablet 3  . ondansetron (ZOFRAN-ODT) 8 MG disintegrating tablet Take 1 tablet (8 mg total) by mouth every 8 (eight) hours as needed for nausea or vomiting. (Patient not taking: Reported on 01/27/2014) 20 tablet 0   No current facility-administered medications for this visit.    SURGICAL HISTORY:  Past Surgical History  Procedure Laterality Date  . Oophorectomy      Around 1985 left ovary removal  .  Tonsillectomy      Around 1994  . Tubal ligation    . Esophagogastroduodenoscopy N/A 09/16/2013    Procedure: ESOPHAGOGASTRODUODENOSCOPY (EGD);  Surgeon: Wonda Horner, MD;  Location: [facility];  Service: Endoscopy;  Laterality: N/A;  . Laparoscopy N/A 09/20/2013    Procedure: DIAGNOSTIC LAPAROSCOPY,DISTAL GASTRECTOMY AND FEEDING GASTROJEJUNOSTOMY;  Surgeon: [name], MD;  Location: [facility];  Service: General;  Laterality: N/A;  . Portacath placement Left 11/18/2013    Procedure: INSERTION PORT-A-CATH;  Surgeon: [name], MD;   Location: [facility];  Service: General;  Laterality: Left;    REVIEW OF SYSTEMS:  Constitutional: positive for anorexia, fatigue and weight loss Eyes: negative Ears, nose, mouth, throat, and face: negative Respiratory: negative Cardiovascular: negative Gastrointestinal: positive for nausea Genitourinary:negative Integument/breast: negative Hematologic/lymphatic: negative Musculoskeletal:negative Neurological: negative Behavioral/Psych: negative Endocrine: negative Allergic/Immunologic: negative   PHYSICAL EXAMINATION: General appearance: alert, cooperative, fatigued and no distress Head: Normocephalic, without obvious abnormality, atraumatic Neck: no adenopathy, no JVD, supple, symmetrical, trachea midline and thyroid not enlarged, symmetric, no tenderness/mass/nodules Lymph nodes: Cervical, supraclavicular, and axillary nodes normal. Resp: clear to auscultation bilaterally Back: symmetric, no curvature. ROM normal. No CVA tenderness. Cardio: regular rate and rhythm, S1, S2 normal, no murmur, click, rub or gallop GI: soft, non-tender; bowel sounds normal; no masses,  no organomegaly Extremities: extremities normal, atraumatic, no cyanosis or edema Neurologic: Alert and oriented X 3, normal strength and tone. Normal symmetric reflexes. Normal coordination and gait  ECOG PERFORMANCE STATUS: 1 - Symptomatic but completely ambulatory  Blood pressure 141/83, pulse 72, temperature 98.3 F (36.8 C), temperature source Oral, resp. rate 18, height 5\' 2"  (1.575 m), weight 123 lb (55.792 kg).  LABORATORY DATA: Lab Results  Component Value Date   WBC 2.6* 01/27/2014   HGB 10.3* 01/27/2014   HCT 32.9* 01/27/2014   MCV 88.4 01/27/2014   PLT 236 01/27/2014      Chemistry      Component Value Date/Time   NA 140 01/27/2014 1030   NA 137 12/13/2013 2359   K 4.3 01/27/2014 1030   K 3.8 12/13/2013 2359   CL 103 12/13/2013 2359   CO2 25 01/27/2014 1030   CO2 25 12/13/2013 2359   BUN 8.8  01/27/2014 1030   BUN 10 12/13/2013 2359   CREATININE 0.9 01/27/2014 1030   CREATININE 0.91 12/13/2013 2359      Component Value Date/Time   CALCIUM 9.5 01/27/2014 1030   CALCIUM 8.3* 12/13/2013 2359   ALKPHOS 90 01/27/2014 1030   ALKPHOS 109 11/11/2013 1415   AST 54* 01/27/2014 1030   AST 27 11/11/2013 1415   ALT 62* 01/27/2014 1030   ALT 75* 11/11/2013 1415   BILITOT 0.75 01/27/2014 1030   BILITOT 0.4 11/11/2013 1415       RADIOGRAPHIC STUDIES:  ASSESSMENT AND PLAN: this is a very pleasant 48 years old [place] female recently diagnosed with a stage IIA gastric adenocarcinoma status post resection with lymph node dissection. She is currently undergoing adjuvant chemotherapy with 5-FU and leucovorin and tolerating her treatment fairly well. She is status post 1 full cycle of her treatment with 6 doses of weekly 5-FU and leucovorin. She has been off treatment for the last 2 weeks. The patient was supposed to start the second cycle of her treatment today but unfortunately her absolute neutrophil count is low. I recommended for her to delay the start of cycle #2 until next week. For the chemotherapy-induced neutropenia, I will start the patient on Granix 300  g subcutaneously for 3 days. She will come back for follow-up visit in 3 weeks for reevaluation and management of any adverse effect of her chemotherapy. For pain management, she will continue on her current pain medication and the patient had prescription for fentanyl patches that she did not refill till now. She was advised to call immediately if she has any concerning symptoms in the interval.  The patient voices understanding of current disease status and treatment options and is in agreement with the current care plan.  All questions were answered. The patient knows to call the clinic with any problems, questions or concerns. We can certainly see the patient much sooner if necessary.  Disclaimer: This note was  dictated with voice recognition software. Similar sounding words can inadvertently be transcribed and may not be corrected upon review.

## 2014-01-27 NOTE — Telephone Encounter (Signed)
gv adn pritneda ppt sched adn avs fo rpt for Dec....sed added tx.

## 2014-01-27 NOTE — Telephone Encounter (Signed)
I called [facility] and told them pt is bringing rx for fentanyl from 01/10/14 that she never picked up at our office. I gave it to pt today . [facility] said they would honor it. Rx given to pt.

## 2014-01-28 ENCOUNTER — Encounter: Payer: Self-pay | Admitting: Radiation Oncology

## 2014-01-28 ENCOUNTER — Ambulatory Visit (HOSPITAL_BASED_OUTPATIENT_CLINIC_OR_DEPARTMENT_OTHER): Payer: No Typology Code available for payment source

## 2014-01-28 DIAGNOSIS — D61818 Other pancytopenia: Secondary | ICD-10-CM

## 2014-01-28 DIAGNOSIS — D701 Agranulocytosis secondary to cancer chemotherapy: Secondary | ICD-10-CM

## 2014-01-28 DIAGNOSIS — C169 Malignant neoplasm of stomach, unspecified: Secondary | ICD-10-CM

## 2014-01-28 MED ORDER — TBO-FILGRASTIM 300 MCG/0.5ML ~~LOC~~ SOSY
300.0000 ug | PREFILLED_SYRINGE | Freq: Once | SUBCUTANEOUS | Status: AC
  Administered 2014-01-28: 300 ug via SUBCUTANEOUS
  Filled 2014-01-28: qty 0.5

## 2014-01-28 NOTE — Patient Instructions (Signed)

## 2014-01-29 ENCOUNTER — Ambulatory Visit (HOSPITAL_BASED_OUTPATIENT_CLINIC_OR_DEPARTMENT_OTHER): Payer: No Typology Code available for payment source

## 2014-01-29 DIAGNOSIS — D701 Agranulocytosis secondary to cancer chemotherapy: Secondary | ICD-10-CM

## 2014-01-29 DIAGNOSIS — D61818 Other pancytopenia: Secondary | ICD-10-CM

## 2014-01-29 DIAGNOSIS — C169 Malignant neoplasm of stomach, unspecified: Secondary | ICD-10-CM

## 2014-01-29 MED ORDER — TBO-FILGRASTIM 300 MCG/0.5ML ~~LOC~~ SOSY
300.0000 ug | PREFILLED_SYRINGE | Freq: Once | SUBCUTANEOUS | Status: AC
  Administered 2014-01-29: 300 ug via SUBCUTANEOUS
  Filled 2014-01-29: qty 0.5

## 2014-01-29 NOTE — Patient Instructions (Signed)

## 2014-01-31 ENCOUNTER — Ambulatory Visit
Admission: RE | Admit: 2014-01-31 | Payer: No Typology Code available for payment source | Source: Ambulatory Visit | Admitting: Radiation Oncology

## 2014-01-31 HISTORY — DX: Other long term (current) drug therapy: Z79.899

## 2014-01-31 HISTORY — DX: Long term (current) use of other immunomodulators and immunosuppressants: Z79.69

## 2014-02-03 ENCOUNTER — Other Ambulatory Visit: Payer: Self-pay

## 2014-02-03 ENCOUNTER — Ambulatory Visit: Payer: Self-pay

## 2014-02-03 ENCOUNTER — Other Ambulatory Visit (HOSPITAL_BASED_OUTPATIENT_CLINIC_OR_DEPARTMENT_OTHER): Payer: No Typology Code available for payment source

## 2014-02-03 ENCOUNTER — Ambulatory Visit (HOSPITAL_BASED_OUTPATIENT_CLINIC_OR_DEPARTMENT_OTHER): Payer: No Typology Code available for payment source

## 2014-02-03 DIAGNOSIS — C169 Malignant neoplasm of stomach, unspecified: Secondary | ICD-10-CM

## 2014-02-03 DIAGNOSIS — C163 Malignant neoplasm of pyloric antrum: Secondary | ICD-10-CM

## 2014-02-03 DIAGNOSIS — Z5111 Encounter for antineoplastic chemotherapy: Secondary | ICD-10-CM

## 2014-02-03 LAB — COMPREHENSIVE METABOLIC PANEL (CC13)
ALBUMIN: 3.7 g/dL (ref 3.5–5.0)
ALK PHOS: 98 U/L (ref 40–150)
ALT: 44 U/L (ref 0–55)
AST: 32 U/L (ref 5–34)
Anion Gap: 9 mEq/L (ref 3–11)
BUN: 7.6 mg/dL (ref 7.0–26.0)
CO2: 28 mEq/L (ref 22–29)
Calcium: 9.1 mg/dL (ref 8.4–10.4)
Chloride: 104 mEq/L (ref 98–109)
Creatinine: 0.9 mg/dL (ref 0.6–1.1)
EGFR: 85 mL/min/{1.73_m2} — ABNORMAL LOW (ref >90)
Glucose: 110 mg/dl (ref 70–140)
POTASSIUM: 4 meq/L (ref 3.5–5.1)
SODIUM: 140 meq/L (ref 136–145)
TOTAL PROTEIN: 6.5 g/dL (ref 6.4–8.3)
Total Bilirubin: 0.65 mg/dL (ref 0.20–1.20)

## 2014-02-03 LAB — CBC WITH DIFFERENTIAL/PLATELET
BASO%: 0.6 % (ref 0.0–2.0)
Basophils Absolute: 0 10*3/uL (ref 0.0–0.1)
EOS%: 0.6 % (ref 0.0–7.0)
Eosinophils Absolute: 0 10*3/uL (ref 0.0–0.5)
HCT: 32.9 % — ABNORMAL LOW (ref 34.8–46.6)
HGB: 10.3 g/dL — ABNORMAL LOW (ref 11.6–15.9)
LYMPH%: 43.7 % (ref 14.0–49.7)
MCH: 27.8 pg (ref 25.1–34.0)
MCHC: 31.2 g/dL — AB (ref 31.5–36.0)
MCV: 89.3 fL (ref 79.5–101.0)
MONO#: 0.8 10*3/uL (ref 0.1–0.9)
MONO%: 14.9 % — AB (ref 0.0–14.0)
NEUT#: 2.3 10*3/uL (ref 1.5–6.5)
NEUT%: 40.2 % (ref 38.4–76.8)
Platelets: 162 10*3/uL (ref 145–400)
RBC: 3.69 10*6/uL — ABNORMAL LOW (ref 3.70–5.45)
RDW: 25.9 % — AB (ref 11.2–14.5)
WBC: 5.6 10*3/uL (ref 3.9–10.3)
lymph#: 2.4 10*3/uL (ref 0.9–3.3)

## 2014-02-03 MED ORDER — SODIUM CHLORIDE 0.9 % IJ SOLN
10.0000 mL | INTRAMUSCULAR | Status: DC | PRN
  Administered 2014-02-03: 10 mL
  Filled 2014-02-03: qty 10

## 2014-02-03 MED ORDER — HEPARIN SOD (PORK) LOCK FLUSH 100 UNIT/ML IV SOLN
500.0000 [IU] | Freq: Once | INTRAVENOUS | Status: AC | PRN
  Administered 2014-02-03: 500 [IU]
  Filled 2014-02-03: qty 5

## 2014-02-03 MED ORDER — PROCHLORPERAZINE MALEATE 10 MG PO TABS
10.0000 mg | ORAL_TABLET | Freq: Once | ORAL | Status: AC
  Administered 2014-02-03: 10 mg via ORAL

## 2014-02-03 MED ORDER — DEXTROSE 5 % IV SOLN
500.0000 mg/m2 | Freq: Once | INTRAVENOUS | Status: AC
  Administered 2014-02-03: 810 mg via INTRAVENOUS
  Filled 2014-02-03: qty 40.5

## 2014-02-03 MED ORDER — SODIUM CHLORIDE 0.9 % IV SOLN
Freq: Once | INTRAVENOUS | Status: AC
  Administered 2014-02-03: 15:00:00 via INTRAVENOUS

## 2014-02-03 MED ORDER — FLUOROURACIL CHEMO INJECTION 2.5 GM/50ML
600.0000 mg/m2 | Freq: Once | INTRAVENOUS | Status: AC
  Administered 2014-02-03: 950 mg via INTRAVENOUS
  Filled 2014-02-03: qty 19

## 2014-02-03 MED ORDER — PROCHLORPERAZINE MALEATE 10 MG PO TABS
ORAL_TABLET | ORAL | Status: AC
  Filled 2014-02-03: qty 1

## 2014-02-03 NOTE — Patient Instructions (Signed)
[facility] Discharge Instructions for Patients Receiving Chemotherapy  Today you received the following chemotherapy agents: Leucovorin and Adrucil.  To help prevent nausea and vomiting after your treatment, we encourage you to take your nausea medication as prescribed.   If you develop nausea and vomiting that is not controlled by your nausea medication, call the clinic.   BELOW ARE SYMPTOMS THAT SHOULD BE REPORTED IMMEDIATELY:  *FEVER GREATER THAN 100.5 F  *CHILLS WITH OR WITHOUT FEVER  NAUSEA AND VOMITING THAT IS NOT CONTROLLED WITH YOUR NAUSEA MEDICATION  *UNUSUAL SHORTNESS OF BREATH  *UNUSUAL BRUISING OR BLEEDING  TENDERNESS IN MOUTH AND THROAT WITH OR WITHOUT PRESENCE OF ULCERS  *URINARY PROBLEMS  *BOWEL PROBLEMS  UNUSUAL RASH Items with * indicate a potential emergency and should be followed up as soon as possible.  Feel free to call the clinic you have any questions or concerns. The clinic phone number is [phone].    

## 2014-02-07 ENCOUNTER — Other Ambulatory Visit: Payer: Self-pay | Admitting: *Deleted

## 2014-02-07 ENCOUNTER — Encounter: Payer: Self-pay | Admitting: Radiation Oncology

## 2014-02-07 ENCOUNTER — Ambulatory Visit
Admission: RE | Admit: 2014-02-07 | Discharge: 2014-02-07 | Disposition: A | Discharge status: Home / Self Care | Payer: No Typology Code available for payment source | Source: Ambulatory Visit | Attending: Radiation Oncology | Admitting: Radiation Oncology

## 2014-02-07 VITALS — BP 100/61 | HR 88 | Temp 98.6°F | Ht 62.0 in | Wt 124.5 lb

## 2014-02-07 DIAGNOSIS — C163 Malignant neoplasm of pyloric antrum: Secondary | ICD-10-CM

## 2014-02-07 DIAGNOSIS — C169 Malignant neoplasm of stomach, unspecified: Secondary | ICD-10-CM

## 2014-02-07 NOTE — Progress Notes (Signed)
[name] here today for reassessment to start radiation therapy.  She is currently on 5-FU and has 3 more cycles as determined by Dr. [name].  She experiences intermittent nausea and has gained 1 lb since her last weight.  She appears fatigued today.  She admits to intermittently wearing her Fentanyl patch, so informed her she will obtain better and longer relief if she applies the patches as ordered.

## 2014-02-08 NOTE — Progress Notes (Signed)
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 Malignant neoplasm of pyloric antrum 151.2 C16.3     Narrative:  The patient returns today for routine follow-up.     She is currently on 5-FU and has 3 more cycles as determined by Dr. [name]: Adjuvant systemic chemotherapy with 5-FU 600 mg/M2 with leucovorin on days 1, 8, 15, 22, 29 and 36 every 8 weeks. First dose 11/19/2013.  She experiences intermittent nausea and has gained 1 lb since her last weight.  She appears fatigued today.  She admits to intermittently wearing her Fentanyl patch, so Patric Dykes RN informed her she will obtain better and longer relief if she applies the patches as ordered.          Her neutrophil count was low on 01/27/14 and thus Awilda Metro PA started [place] and delayed her infusion of chemotherapy until 02/03/14.                ALLERGIES:  has No Known Allergies.  Meds: Current Outpatient Prescriptions  Medication Sig Dispense Refill  . carvedilol (COREG) 6.25 MG tablet Take 6.25 mg by mouth 2 (two) times daily with a meal.    . fentaNYL (DURAGESIC - DOSED MCG/HR) 25 MCG/HR patch Place 1 patch (25 mcg total) onto the skin every 3 (three) days. 10 patch 0  . lidocaine (LIDODERM) 5 % 1 patch daily as needed.    . lidocaine-prilocaine (EMLA) cream Apply 1 application topically as needed. Apply to port 1 hour before chemo 30 g 0  . LORazepam (ATIVAN) 1 MG tablet Take 1 tablet (1 mg total) by mouth every 8 (eight) hours as needed for anxiety. 30 tablet 0  . metoCLOPramide (REGLAN) 10 MG tablet Take 1 tablet (10 mg  total) by mouth 4 (four) times daily. 120 tablet 3  . Multiple Vitamins-Minerals (MULTIVITAMIN & MINERAL PO) Take 1 tablet by mouth daily.    . Nutritional Supplements (ENSURE COMPLETE PO) Take 237 mLs by mouth daily. Takes a little at a time, in order to keep it down.    Marland Kitchen OLANZapine zydis (ZYPREXA) 5 MG disintegrating tablet Take 1 tablet (5 mg total) by mouth at bedtime. (Patient not taking: Reported on 01/27/2014) 30 tablet 3  . ondansetron (ZOFRAN-ODT) 8 MG disintegrating tablet Take 1 tablet (8 mg total) by mouth every 8 (eight) hours as needed for nausea or vomiting. 20 tablet 0  . pantoprazole (PROTONIX) 40 MG tablet Take 1 tablet (40 mg total) by mouth 2 (two) times daily before a meal. 60 tablet 3   No current facility-administered medications for this encounter.    Physical Findings: The patient is in no acute distress. Patient is alert and oriented. .    Vitals with Age-Percentiles 02/07/2014  Length 932.3 cm  Systolic 557  Diastolic 61  Pulse 88  Respiration   Weight 56.473 kg  BMI 22.8  VISIT REPORT    General: Alert and oriented, in no acute distress HEENT: Head is normocephalic. Extraocular movements are intact.  Brown speckles on tongue Neck: Neck is supple, no palpable cervical or supraclavicular lymphadenopathy.  Heart: Regular in rate and rhythm with no murmurs, rubs, or gallops. Chest: Clear to auscultation bilaterally, with no rhonchi, wheezes, or rales. Abdomen: Soft, slightly tender, nondistended, with no rigidity or guarding. Extremities: No cyanosis or edema.  Skin - brown speckles on palms  Lab Findings: Lab Results  Component Value Date   WBC 5.6 02/03/2014   HGB 10.3* 02/03/2014   HCT 32.9* 02/03/2014   MCV 89.3 02/03/2014   PLT 162 02/03/2014    Radiographic Findings: No results found.  Impression/Plan:  It was a pleasure seeing her again today.   Consent for radiotherapy re-reviewed and an up to date form was signed for her chart, in  anticipation of simulation in 2016.  I will not schedule her simulation until she is released by medical oncology for RT planning. Until then, she will continue with adjuvant chemotherapy. _____________________________________   Eppie Gibson, MD

## 2014-02-10 ENCOUNTER — Other Ambulatory Visit: Payer: Self-pay | Admitting: *Deleted

## 2014-02-10 ENCOUNTER — Ambulatory Visit (HOSPITAL_BASED_OUTPATIENT_CLINIC_OR_DEPARTMENT_OTHER): Payer: No Typology Code available for payment source

## 2014-02-10 ENCOUNTER — Ambulatory Visit: Payer: Self-pay

## 2014-02-10 ENCOUNTER — Other Ambulatory Visit: Payer: Self-pay

## 2014-02-10 ENCOUNTER — Ambulatory Visit (HOSPITAL_BASED_OUTPATIENT_CLINIC_OR_DEPARTMENT_OTHER): Payer: No Typology Code available for payment source | Admitting: Internal Medicine

## 2014-02-10 DIAGNOSIS — C169 Malignant neoplasm of stomach, unspecified: Secondary | ICD-10-CM

## 2014-02-10 DIAGNOSIS — D61818 Other pancytopenia: Secondary | ICD-10-CM

## 2014-02-10 DIAGNOSIS — Z5111 Encounter for antineoplastic chemotherapy: Secondary | ICD-10-CM

## 2014-02-10 DIAGNOSIS — C163 Malignant neoplasm of pyloric antrum: Secondary | ICD-10-CM

## 2014-02-10 LAB — CBC WITH DIFFERENTIAL/PLATELET
BASO%: 0.5 % (ref 0.0–2.0)
Basophils Absolute: 0 10*3/uL (ref 0.0–0.1)
EOS%: 1.3 % (ref 0.0–7.0)
Eosinophils Absolute: 0.1 10*3/uL (ref 0.0–0.5)
HCT: 29.9 % — ABNORMAL LOW (ref 34.8–46.6)
HGB: 9.6 g/dL — ABNORMAL LOW (ref 11.6–15.9)
LYMPH%: 55.3 % — AB (ref 14.0–49.7)
MCH: 29.4 pg (ref 25.1–34.0)
MCHC: 32.1 g/dL (ref 31.5–36.0)
MCV: 91.4 fL (ref 79.5–101.0)
MONO#: 0 10*3/uL — ABNORMAL LOW (ref 0.1–0.9)
MONO%: 0.5 % (ref 0.0–14.0)
NEUT#: 1.6 10*3/uL (ref 1.5–6.5)
NEUT%: 42.4 % (ref 38.4–76.8)
PLATELETS: 231 10*3/uL (ref 145–400)
RBC: 3.27 10*6/uL — AB (ref 3.70–5.45)
RDW: 20.2 % — ABNORMAL HIGH (ref 11.2–14.5)
WBC: 3.7 10*3/uL — ABNORMAL LOW (ref 3.9–10.3)
lymph#: 2.1 10*3/uL (ref 0.9–3.3)

## 2014-02-10 LAB — COMPREHENSIVE METABOLIC PANEL (CC13)
ALBUMIN: 3.9 g/dL (ref 3.5–5.0)
ALT: 19 U/L (ref 0–55)
ANION GAP: 8 meq/L (ref 3–11)
AST: 12 U/L (ref 5–34)
Alkaline Phosphatase: 77 U/L (ref 40–150)
BUN: 11.2 mg/dL (ref 7.0–26.0)
CO2: 27 meq/L (ref 22–29)
Calcium: 9.2 mg/dL (ref 8.4–10.4)
Chloride: 105 mEq/L (ref 98–109)
Creatinine: 0.9 mg/dL (ref 0.6–1.1)
EGFR: >90 mL/min/{1.73_m2} (ref >90)
Glucose: 107 mg/dl (ref 70–140)
POTASSIUM: 4.2 meq/L (ref 3.5–5.1)
Sodium: 139 mEq/L (ref 136–145)
Total Bilirubin: 1.1 mg/dL (ref 0.20–1.20)
Total Protein: 6.9 g/dL (ref 6.4–8.3)

## 2014-02-10 MED ORDER — SODIUM CHLORIDE 0.9 % IJ SOLN
10.0000 mL | INTRAMUSCULAR | Status: DC | PRN
  Administered 2014-02-10: 10 mL via INTRAVENOUS
  Filled 2014-02-10: qty 10

## 2014-02-10 MED ORDER — FLUOROURACIL CHEMO INJECTION 2.5 GM/50ML
600.0000 mg/m2 | Freq: Once | INTRAVENOUS | Status: AC
  Administered 2014-02-10: 950 mg via INTRAVENOUS
  Filled 2014-02-10: qty 19

## 2014-02-10 MED ORDER — SODIUM CHLORIDE 0.9 % IV SOLN
Freq: Once | INTRAVENOUS | Status: AC
  Administered 2014-02-10: 13:00:00 via INTRAVENOUS

## 2014-02-10 MED ORDER — LEUCOVORIN CALCIUM INJECTION 350 MG
500.0000 mg/m2 | Freq: Once | INTRAVENOUS | Status: AC
  Administered 2014-02-10: 810 mg via INTRAVENOUS
  Filled 2014-02-10: qty 40.5

## 2014-02-10 MED ORDER — PROCHLORPERAZINE MALEATE 10 MG PO TABS
10.0000 mg | ORAL_TABLET | Freq: Once | ORAL | Status: AC
  Administered 2014-02-10: 10 mg via ORAL

## 2014-02-10 MED ORDER — HEPARIN SOD (PORK) LOCK FLUSH 100 UNIT/ML IV SOLN
500.0000 [IU] | Freq: Once | INTRAVENOUS | Status: AC
  Administered 2014-02-10: 500 [IU] via INTRAVENOUS
  Filled 2014-02-10: qty 5

## 2014-02-10 MED ORDER — PROCHLORPERAZINE MALEATE 10 MG PO TABS
ORAL_TABLET | ORAL | Status: AC
  Filled 2014-02-10: qty 1

## 2014-02-10 NOTE — Patient Instructions (Signed)
[facility] Discharge Instructions for Patients Receiving Chemotherapy  Today you received the following chemotherapy agents Leucovorin/5FU.  To help prevent nausea and vomiting after your treatment, we encourage you to take your nausea medication as directed.    If you develop nausea and vomiting that is not controlled by your nausea medication, call the clinic.   BELOW ARE SYMPTOMS THAT SHOULD BE REPORTED IMMEDIATELY:  *FEVER GREATER THAN 100.5 F  *CHILLS WITH OR WITHOUT FEVER  NAUSEA AND VOMITING THAT IS NOT CONTROLLED WITH YOUR NAUSEA MEDICATION  *UNUSUAL SHORTNESS OF BREATH  *UNUSUAL BRUISING OR BLEEDING  TENDERNESS IN MOUTH AND THROAT WITH OR WITHOUT PRESENCE OF ULCERS  *URINARY PROBLEMS  *BOWEL PROBLEMS  UNUSUAL RASH Items with * indicate a potential emergency and should be followed up as soon as possible.  Feel free to call the clinic you have any questions or concerns. The clinic phone number is [phone].

## 2014-02-17 ENCOUNTER — Ambulatory Visit: Payer: No Typology Code available for payment source

## 2014-02-17 ENCOUNTER — Other Ambulatory Visit: Payer: Self-pay | Admitting: *Deleted

## 2014-02-17 ENCOUNTER — Ambulatory Visit (HOSPITAL_COMMUNITY)
Admission: RE | Admit: 2014-02-17 | Discharge: 2014-02-17 | Disposition: A | Discharge status: Home / Self Care | Payer: No Typology Code available for payment source | Source: Ambulatory Visit | Attending: Oncology | Admitting: Oncology

## 2014-02-17 ENCOUNTER — Other Ambulatory Visit (HOSPITAL_BASED_OUTPATIENT_CLINIC_OR_DEPARTMENT_OTHER): Payer: No Typology Code available for payment source

## 2014-02-17 ENCOUNTER — Ambulatory Visit: Payer: Self-pay | Admitting: Oncology

## 2014-02-17 ENCOUNTER — Ambulatory Visit: Payer: Self-pay

## 2014-02-17 ENCOUNTER — Encounter: Payer: Self-pay | Admitting: Oncology

## 2014-02-17 ENCOUNTER — Other Ambulatory Visit: Payer: Self-pay

## 2014-02-17 ENCOUNTER — Ambulatory Visit (HOSPITAL_BASED_OUTPATIENT_CLINIC_OR_DEPARTMENT_OTHER): Payer: No Typology Code available for payment source | Admitting: Oncology

## 2014-02-17 VITALS — BP 160/78 | HR 68 | Temp 98.4°F | Resp 18 | Ht 62.0 in | Wt 123.6 lb

## 2014-02-17 DIAGNOSIS — C163 Malignant neoplasm of pyloric antrum: Secondary | ICD-10-CM

## 2014-02-17 DIAGNOSIS — R112 Nausea with vomiting, unspecified: Secondary | ICD-10-CM | POA: Insufficient documentation

## 2014-02-17 DIAGNOSIS — R109 Unspecified abdominal pain: Secondary | ICD-10-CM | POA: Insufficient documentation

## 2014-02-17 DIAGNOSIS — Z85028 Personal history of other malignant neoplasm of stomach: Secondary | ICD-10-CM | POA: Insufficient documentation

## 2014-02-17 DIAGNOSIS — C169 Malignant neoplasm of stomach, unspecified: Secondary | ICD-10-CM

## 2014-02-17 LAB — COMPREHENSIVE METABOLIC PANEL (CC13)
ALT: 14 U/L (ref 0–55)
ANION GAP: 10 meq/L (ref 3–11)
AST: 12 U/L (ref 5–34)
Albumin: 4.4 g/dL (ref 3.5–5.0)
Alkaline Phosphatase: 82 U/L (ref 40–150)
BILIRUBIN TOTAL: 1.46 mg/dL — AB (ref 0.20–1.20)
BUN: 12.4 mg/dL (ref 7.0–26.0)
CO2: 27 mEq/L (ref 22–29)
CREATININE: 0.9 mg/dL (ref 0.6–1.1)
Calcium: 9.6 mg/dL (ref 8.4–10.4)
Chloride: 98 mEq/L (ref 98–109)
EGFR: 85 mL/min/{1.73_m2} — ABNORMAL LOW (ref >90)
Glucose: 125 mg/dl (ref 70–140)
Potassium: 3.8 mEq/L (ref 3.5–5.1)
SODIUM: 135 meq/L — AB (ref 136–145)
Total Protein: 7.7 g/dL (ref 6.4–8.3)

## 2014-02-17 LAB — CBC WITH DIFFERENTIAL/PLATELET
BASO%: 0 % (ref 0.0–2.0)
BASOS ABS: 0 10*3/uL (ref 0.0–0.1)
EOS%: 1.7 % (ref 0.0–7.0)
Eosinophils Absolute: 0.1 10*3/uL (ref 0.0–0.5)
HCT: 29.1 % — ABNORMAL LOW (ref 34.8–46.6)
HEMOGLOBIN: 9.6 g/dL — AB (ref 11.6–15.9)
LYMPH%: 35 % (ref 14.0–49.7)
MCH: 29.4 pg (ref 25.1–34.0)
MCHC: 33 g/dL (ref 31.5–36.0)
MCV: 89.3 fL (ref 79.5–101.0)
MONO#: 0 10*3/uL — ABNORMAL LOW (ref 0.1–0.9)
MONO%: 0.8 % (ref 0.0–14.0)
NEUT#: 2.2 10*3/uL (ref 1.5–6.5)
NEUT%: 62.5 % (ref 38.4–76.8)
PLATELETS: 195 10*3/uL (ref 145–400)
RBC: 3.26 10*6/uL — ABNORMAL LOW (ref 3.70–5.45)
RDW: 17.6 % — ABNORMAL HIGH (ref 11.2–14.5)
WBC: 3.5 10*3/uL — ABNORMAL LOW (ref 3.9–10.3)
lymph#: 1.2 10*3/uL (ref 0.9–3.3)

## 2014-02-17 MED ORDER — OXYCODONE HCL 5 MG PO TABS: 5.0000 mg | ORAL_TABLET | ORAL | Status: DC | PRN

## 2014-02-17 NOTE — Progress Notes (Signed)
[facility] Telephone:(336) [phone]   Fax:(336) [phone]  OFFICE PROGRESS NOTE  Arnoldo Morale, MD [address] [place] [address]  DIAGNOSIS: Stage IIA (T1a, N2, M0) gastric adenocarcinoma diagnosed in July of 2015  PRIOR [place] post diagnostic laparoscopy, distal gastrectomy with billroth II anastamosis and feeding gastrojejunostomy tube under the care of Dr. Barry Dienes on 09/20/2013.   CURRENT THERAPY: Adjuvant systemic chemotherapy with 5-FU 600 mg/M2 with leucovorin on days 1, 8, 15, 22, 29 and 36 every 8 weeks. First dose 11/19/2013.  INTERVAL HISTORY: Paula [name] 48 y.o. female returns to the clinic today for follow-up prior to cycle 2 day 15 of systemic chemotherapy with 5-FU and leucovorin. Reports that she is not feeling well today. Having nausea and vomiting with abdominal pain. Vomited X3 last night. Pain present since yesterday. Has been able to take in sips of water and gingerale today.She denied having any significant fever or chills. Has taken Zofran which helps some. Rates pain 9/10 over her lower abdomen/ Took Dilaudid this morning which did not help. Not using Fentanyl patch as ordered. She denied having any significant shortness of breath or hemoptysis. She has no significant weight loss or night sweats.   MEDICAL HISTORY: Past Medical History  Diagnosis Date  . Headache(784.0) 2011    Migraines  . Esophageal reflux     On omeprazole  . Costochondritis   . Hypertension     started around 2011  . Ovarian cyst   . Stomach cancer   . Family history of cancer   . Heart murmur   . On antineoplastic chemotherapy     5-FU and leucovorin    ALLERGIES:  has No Known Allergies.  MEDICATIONS:  Current Outpatient Prescriptions  Medication Sig Dispense Refill  . carvedilol (COREG) 6.25 MG tablet Take 6.25 mg by mouth 2 (two) times daily with a meal.    . fentaNYL (DURAGESIC - DOSED MCG/HR) 25 MCG/HR patch Place 1 patch  (25 mcg total) onto the skin every 3 (three) days. 10 patch 0  . lidocaine-prilocaine (EMLA) cream Apply 1 application topically as needed. Apply to port 1 hour before chemo 30 g 0  . LORazepam (ATIVAN) 1 MG tablet Take 1 tablet (1 mg total) by mouth every 8 (eight) hours as needed for anxiety. 30 tablet 0  . metoCLOPramide (REGLAN) 10 MG tablet Take 1 tablet (10 mg total) by mouth 4 (four) times daily. 120 tablet 3  . Multiple Vitamins-Minerals (MULTIVITAMIN & MINERAL PO) Take 1 tablet by mouth daily.    . Nutritional Supplements (ENSURE COMPLETE PO) Take 237 mLs by mouth daily. Takes a little at a time, in order to keep it down.    Marland Kitchen OLANZapine zydis (ZYPREXA) 5 MG disintegrating tablet Take 1 tablet (5 mg total) by mouth at bedtime. 30 tablet 3  . ondansetron (ZOFRAN-ODT) 8 MG disintegrating tablet Take 1 tablet (8 mg total) by mouth every 8 (eight) hours as needed for nausea or vomiting. 20 tablet 0  . pantoprazole (PROTONIX) 40 MG tablet Take 1 tablet (40 mg total) by mouth 2 (two) times daily before a meal. 60 tablet 3  . lidocaine (LIDODERM) 5 % 1 patch daily as needed.    Marland Kitchen oxyCODONE (OXY IR/ROXICODONE) 5 MG immediate release tablet Take 1 tablet (5 mg total) by mouth every 4 (four) hours as needed for severe pain. 45 tablet 0   No current facility-administered medications for this visit.  SURGICAL HISTORY:  Past Surgical History  Procedure Laterality Date  . Oophorectomy      Around 1985 left ovary removal  . Tonsillectomy      Around 1994  . Tubal ligation    . Esophagogastroduodenoscopy N/A 09/16/2013    Procedure: ESOPHAGOGASTRODUODENOSCOPY (EGD);  Surgeon: Wonda Horner, MD;  Location: [facility];  Service: Endoscopy;  Laterality: N/A;  . Laparoscopy N/A 09/20/2013    Procedure: DIAGNOSTIC LAPAROSCOPY,DISTAL GASTRECTOMY AND FEEDING GASTROJEJUNOSTOMY;  Surgeon: [name], MD;  Location: [facility];  Service: General;  Laterality: N/A;  . Portacath placement Left 11/18/2013     Procedure: INSERTION PORT-A-CATH;  Surgeon: [name], MD;  Location: [facility];  Service: General;  Laterality: Left;    REVIEW OF SYSTEMS:  Constitutional: negative except for anorexia and fatigue Eyes: negative Ears, nose, mouth, throat, and face: negative Respiratory: negative Cardiovascular: negative Gastrointestinal: negative except for abdominal pain, nausea and vomiting Genitourinary:negative Integument/breast: negative Hematologic/lymphatic: negative Musculoskeletal:negative Neurological: negative   PHYSICAL EXAMINATION: General appearance: alert, cooperative, fatigued and no distress Head: Normocephalic, without obvious abnormality, atraumatic Neck: no adenopathy, no JVD, supple, symmetrical, trachea midline and thyroid not enlarged, symmetric, no tenderness/mass/nodules Lymph nodes: Cervical, supraclavicular, and axillary nodes normal. Resp: clear to auscultation bilaterally Back: symmetric, no curvature. ROM normal. No CVA tenderness. Cardio: regular rate and rhythm, S1, S2 normal, no murmur, click, rub or gallop GI: soft, non-tender; bowel sounds normal; no masses,  no organomegaly Extremities: extremities normal, atraumatic, no cyanosis or edema Neurologic: Alert and oriented X 3, normal strength and tone. Normal symmetric reflexes. Normal coordination and gait  ECOG PERFORMANCE STATUS: 1 - Symptomatic but completely ambulatory  Blood pressure 160/78, pulse 68, temperature 98.4 F (36.9 C), temperature source Oral, resp. rate 18, height 5\' 2"  (1.575 m), weight 123 lb 9.6 oz (56.065 kg), SpO2 100 %.  LABORATORY DATA: Lab Results  Component Value Date   WBC 3.5* 02/17/2014   HGB 9.6* 02/17/2014   HCT 29.1* 02/17/2014   MCV 89.3 02/17/2014   PLT 195 02/17/2014      Chemistry      Component Value Date/Time   NA 135* 02/17/2014 1244   NA 137 12/13/2013 2359   K 3.8 02/17/2014 1244   K 3.8 12/13/2013 2359   CL 103 12/13/2013 2359   CO2 27 02/17/2014 1244    CO2 25 12/13/2013 2359   BUN 12.4 02/17/2014 1244   BUN 10 12/13/2013 2359   CREATININE 0.9 02/17/2014 1244   CREATININE 0.91 12/13/2013 2359      Component Value Date/Time   CALCIUM 9.6 02/17/2014 1244   CALCIUM 8.3* 12/13/2013 2359   ALKPHOS 82 02/17/2014 1244   ALKPHOS 109 11/11/2013 1415   AST 12 02/17/2014 1244   AST 27 11/11/2013 1415   ALT 14 02/17/2014 1244   ALT 75* 11/11/2013 1415   BILITOT 1.46* 02/17/2014 1244   BILITOT 0.4 11/11/2013 1415       RADIOGRAPHIC STUDIES:  EXAM: ABDOMEN - 2 VIEW  COMPARISON: December 13, 2013  FINDINGS: Supine and upright images were obtained. There is postoperative change in the left upper quadrant. There is no bowel dilatation or air-fluid level suggest obstruction. No free air. There are apparent phleboliths in the pelvis. Lung bases are clear.  IMPRESSION: Bowel gas pattern unremarkable. Postoperative change left upper quadrant.   Electronically Signed  By: Lowella Grip M.D.  On: 02/17/2014 14:03  ASSESSMENT AND PLAN: this is a very pleasant 48 years old [place] American female recently diagnosed  with a stage IIA gastric adenocarcinoma status post resection with lymph node dissection. She is currently undergoing adjuvant chemotherapy with 5-FU and leucovorin and tolerating her treatment fairly well. She is status post 1 full cycle of her treatment with 6 doses of weekly 5-FU and leucovorin. She is here for cycle 2 day 15 today. She is not feeling well and we will hold her chemo. She was sent for an abdominal x-ray which showed no evidence of obstruction or perforation. She does have a non-specific bowel gas pattern. She was instructed to use Gas-X four times a day along with Oxycodone 5 mg every 4 hours as needed for her pain. I have also encouraged her to begin using her Fentanyl patch on a regular basis. If pain worsens, she was instructed to call our office or report to the ER for further evaluation.   She  will come back for follow-up visit in 1 week for reevaluation and to determine if she should resume her chemotherapy at that time.  She was advised to call immediately if she has any concerning symptoms in the interval.  The patient voices understanding of current disease status and treatment options and is in agreement with the current care plan.  All questions were answered. The patient knows to call the clinic with any problems, questions or concerns. We can certainly see the patient much sooner if necessary.  Patient was seen and examined with Dr. [name].  Mikey Bussing, DNP, AGPCNP-BC, AOCNP  ADDENDUM: Hematology/Oncology Attending: I had a face to face encounter with the patient today. I recommended her care plan. This is a very pleasant 48 years old African-American female with a stage II a gastric adenocarcinoma status post surgical resection and currently undergoing the second cycle of adjuvant chemotherapy with weekly 5-FU and leucovorin. She presented today for her weekly treatment but she was complaining of abdominal pain started last night with nausea and she vomited 3 times. Her nausea improved with Zofran.  She does not want to proceed with her chemotherapy today as scheduled. I recommended for the patient to have abdominal x-ray to rule out any perforation or ileus. The abdominal x-ray was unremarkable. I recommended for the patient to continue with her current pain medication as well as antiemetics. We will reschedule her chemotherapy for next week. She was advised to call immediately if no improvement in her condition.  Disclaimer: This note was dictated with voice recognition software. Similar sounding words can inadvertently be transcribed and may be missed upon review. Eilleen Kempf., MD 02/18/2014

## 2014-02-22 NOTE — Progress Notes (Signed)
This encounter was created in error - please disregard.

## 2014-02-24 ENCOUNTER — Ambulatory Visit: Payer: No Typology Code available for payment source

## 2014-02-24 ENCOUNTER — Ambulatory Visit (HOSPITAL_BASED_OUTPATIENT_CLINIC_OR_DEPARTMENT_OTHER): Payer: No Typology Code available for payment source | Admitting: Oncology

## 2014-02-24 ENCOUNTER — Other Ambulatory Visit (HOSPITAL_BASED_OUTPATIENT_CLINIC_OR_DEPARTMENT_OTHER): Payer: No Typology Code available for payment source

## 2014-02-24 ENCOUNTER — Encounter: Payer: Self-pay | Admitting: Oncology

## 2014-02-24 ENCOUNTER — Telehealth: Payer: Self-pay | Admitting: Internal Medicine

## 2014-02-24 VITALS — BP 108/65 | HR 64 | Temp 98.2°F | Resp 18 | Ht 62.0 in | Wt 126.5 lb

## 2014-02-24 DIAGNOSIS — C169 Malignant neoplasm of stomach, unspecified: Secondary | ICD-10-CM | POA: Diagnosis not present

## 2014-02-24 DIAGNOSIS — D709 Neutropenia, unspecified: Secondary | ICD-10-CM | POA: Diagnosis not present

## 2014-02-24 DIAGNOSIS — D61818 Other pancytopenia: Secondary | ICD-10-CM

## 2014-02-24 DIAGNOSIS — C163 Malignant neoplasm of pyloric antrum: Secondary | ICD-10-CM

## 2014-02-24 LAB — CBC WITH DIFFERENTIAL/PLATELET
BASO%: 0.9 % (ref 0.0–2.0)
BASOS ABS: 0 10*3/uL (ref 0.0–0.1)
EOS ABS: 0.1 10*3/uL (ref 0.0–0.5)
EOS%: 3.9 % (ref 0.0–7.0)
HEMATOCRIT: 25.8 % — AB (ref 34.8–46.6)
HEMOGLOBIN: 8.1 g/dL — AB (ref 11.6–15.9)
LYMPH%: 73.4 % — ABNORMAL HIGH (ref 14.0–49.7)
MCH: 30 pg (ref 25.1–34.0)
MCHC: 31.4 g/dL — ABNORMAL LOW (ref 31.5–36.0)
MCV: 95.6 fL (ref 79.5–101.0)
MONO#: 0.1 10*3/uL (ref 0.1–0.9)
MONO%: 6 % (ref 0.0–14.0)
NEUT#: 0.4 10*3/uL — CL (ref 1.5–6.5)
NEUT%: 15.8 % — AB (ref 38.4–76.8)
PLATELETS: 205 10*3/uL (ref 145–400)
RBC: 2.7 10*6/uL — ABNORMAL LOW (ref 3.70–5.45)
RDW: 19.2 % — AB (ref 11.2–14.5)
WBC: 2.3 10*3/uL — ABNORMAL LOW (ref 3.9–10.3)
lymph#: 1.7 10*3/uL (ref 0.9–3.3)

## 2014-02-24 LAB — COMPREHENSIVE METABOLIC PANEL (CC13)
ALT: 12 U/L (ref 0–55)
ANION GAP: 10 meq/L (ref 3–11)
AST: 12 U/L (ref 5–34)
Albumin: 3.6 g/dL (ref 3.5–5.0)
Alkaline Phosphatase: 67 U/L (ref 40–150)
BUN: 11.4 mg/dL (ref 7.0–26.0)
CALCIUM: 8.9 mg/dL (ref 8.4–10.4)
CHLORIDE: 105 meq/L (ref 98–109)
CO2: 27 meq/L (ref 22–29)
Creatinine: 1 mg/dL (ref 0.6–1.1)
EGFR: 78 mL/min/{1.73_m2} — ABNORMAL LOW (ref >90)
Glucose: 132 mg/dl (ref 70–140)
POTASSIUM: 3.7 meq/L (ref 3.5–5.1)
Sodium: 142 mEq/L (ref 136–145)
Total Bilirubin: 0.53 mg/dL (ref 0.20–1.20)
Total Protein: 6 g/dL — ABNORMAL LOW (ref 6.4–8.3)

## 2014-02-24 MED ORDER — TBO-FILGRASTIM 300 MCG/0.5ML ~~LOC~~ SOSY
300.0000 ug | PREFILLED_SYRINGE | Freq: Once | SUBCUTANEOUS | Status: AC
  Administered 2014-02-24: 300 ug via SUBCUTANEOUS
  Filled 2014-02-24: qty 0.5

## 2014-02-24 MED ORDER — PROCHLORPERAZINE MALEATE 10 MG PO TABS
ORAL_TABLET | ORAL | Status: AC
  Filled 2014-02-24: qty 1

## 2014-02-24 NOTE — Telephone Encounter (Signed)
gv and printed appt sched and avs for pt for jan 2016....sed added tx.

## 2014-02-24 NOTE — Progress Notes (Unsigned)
Spoke to Dr [name] concerning counts today.  Per Dr [name], treatment to be held today.  Pt to return next week as scheduled. Spoke to pt in lobby, and given copy of counts.  Pt verbalized understanding.  Pt scheduled to see Altamese Dilling, NP today.

## 2014-02-24 NOTE — Patient Instructions (Signed)

## 2014-02-24 NOTE — Progress Notes (Signed)
[facility] [phone]   Fax:(336) [phone]  OFFICE PROGRESS NOTE  Arnoldo Morale, MD [address] [place] [address]  DIAGNOSIS: Stage IIA (T1a, N2, M0) gastric adenocarcinoma diagnosed in July of 2015  PRIOR [place] post diagnostic laparoscopy, distal gastrectomy with billroth II anastamosis and feeding gastrojejunostomy tube under the care of Dr. Barry Dienes on 09/20/2013.   CURRENT THERAPY: Adjuvant systemic chemotherapy with 5-FU 600 mg/M2 with leucovorin on days 1, 8, 15, 22, 29 and 36 every 8 weeks. First dose 11/19/2013.  INTERVAL HISTORY: Tynetta A Smith-Golden 49 y.o. female returns to the clinic today for follow-up prior to cycle 2 day 22 of systemic chemotherapy with 5-FU and leucovorin. Cycle 2 day 15 was held last week due to abdominal pain, nausea, and vomiting. Feeling better today. Abdominal pain has resolved as have the nausea and vomiting. She denied having any significant fever or chills. She denied having any significant shortness of breath or hemoptysis. She has no significant weight loss or night sweats.   MEDICAL HISTORY: Past Medical History  Diagnosis Date  . Headache(784.0) 2011    Migraines  . Esophageal reflux     On omeprazole  . Costochondritis   . Hypertension     started around 2011  . Ovarian cyst   . Stomach cancer   . Family history of cancer   . Heart murmur   . On antineoplastic chemotherapy     5-FU and leucovorin    ALLERGIES:  has No Known Allergies.  MEDICATIONS:  Current Outpatient Prescriptions  Medication Sig Dispense Refill  . carvedilol (COREG) 6.25 MG tablet Take 6.25 mg by mouth 2 (two) times daily with a meal.    . fentaNYL (DURAGESIC - DOSED MCG/HR) 25 MCG/HR patch Place 1 patch (25 mcg total) onto the skin every 3 (three) days. 10 patch 0  . lidocaine (LIDODERM) 5 % 1 patch daily as needed.    . lidocaine-prilocaine (EMLA) cream Apply 1 application topically as needed.  Apply to port 1 hour before chemo 30 g 0  . LORazepam (ATIVAN) 1 MG tablet Take 1 tablet (1 mg total) by mouth every 8 (eight) hours as needed for anxiety. 30 tablet 0  . Multiple Vitamins-Minerals (MULTIVITAMIN & MINERAL PO) Take 1 tablet by mouth daily.    Marland Kitchen OLANZapine zydis (ZYPREXA) 5 MG disintegrating tablet Take 1 tablet (5 mg total) by mouth at bedtime. 30 tablet 3  . ondansetron (ZOFRAN-ODT) 8 MG disintegrating tablet Take 1 tablet (8 mg total) by mouth every 8 (eight) hours as needed for nausea or vomiting. 20 tablet 0  . oxyCODONE (OXY IR/ROXICODONE) 5 MG immediate release tablet Take 1 tablet (5 mg total) by mouth every 4 (four) hours as needed for severe pain. 45 tablet 0  . pantoprazole (PROTONIX) 40 MG tablet Take 1 tablet (40 mg total) by mouth 2 (two) times daily before a meal. 60 tablet 3  . metoCLOPramide (REGLAN) 10 MG tablet Take 1 tablet (10 mg total) by mouth 4 (four) times daily. (Patient not taking: Reported on 02/24/2014) 120 tablet 3  . Nutritional Supplements (ENSURE COMPLETE PO) Take 237 mLs by mouth daily. Takes a little at a time, in order to keep it down.     No current facility-administered medications for this visit.    SURGICAL HISTORY:  Past Surgical History  Procedure Laterality Date  . Oophorectomy      Around 1985 left ovary removal  . Tonsillectomy  Around 1994  . Tubal ligation    . Esophagogastroduodenoscopy N/A 09/16/2013    Procedure: ESOPHAGOGASTRODUODENOSCOPY (EGD);  Surgeon: Wonda Horner, MD;  Location: [facility];  Service: Endoscopy;  Laterality: N/A;  . Laparoscopy N/A 09/20/2013    Procedure: DIAGNOSTIC LAPAROSCOPY,DISTAL GASTRECTOMY AND FEEDING GASTROJEJUNOSTOMY;  Surgeon: [name], MD;  Location: [facility];  Service: General;  Laterality: N/A;  . Portacath placement Left 11/18/2013    Procedure: INSERTION PORT-A-CATH;  Surgeon: [name], MD;  Location: [facility];  Service: General;  Laterality: Left;    REVIEW OF SYSTEMS:   Constitutional: negative except for anorexia and fatigue Eyes: negative Ears, nose, mouth, throat, and face: negative Respiratory: negative Cardiovascular: negative Gastrointestinal: negative except for abdominal pain, nausea and vomiting Genitourinary:negative Integument/breast: negative Hematologic/lymphatic: negative Musculoskeletal:negative Neurological: negative   PHYSICAL EXAMINATION: General appearance: alert, cooperative, fatigued and no distress Head: Normocephalic, without obvious abnormality, atraumatic Neck: no adenopathy, no JVD, supple, symmetrical, trachea midline and thyroid not enlarged, symmetric, no tenderness/mass/nodules Lymph nodes: Cervical, supraclavicular, and axillary nodes normal. Resp: clear to auscultation bilaterally Back: symmetric, no curvature. ROM normal. No CVA tenderness. Cardio: regular rate and rhythm, S1, S2 normal, no murmur, click, rub or gallop GI: soft, non-tender; bowel sounds normal; no masses,  no organomegaly Extremities: extremities normal, atraumatic, no cyanosis or edema Neurologic: Alert and oriented X 3, normal strength and tone. Normal symmetric reflexes. Normal coordination and gait  ECOG PERFORMANCE STATUS: 1 - Symptomatic but completely ambulatory  Blood pressure 108/65, pulse 64, temperature 98.2 F (36.8 C), temperature source Oral, resp. rate 18, height 5\' 2"  (1.575 m), weight 126 lb 8 oz (57.38 kg).  LABORATORY DATA: Lab Results  Component Value Date   WBC 2.3* 02/24/2014   HGB 8.1* 02/24/2014   HCT 25.8* 02/24/2014   MCV 95.6 02/24/2014   PLT 205 02/24/2014      Chemistry      Component Value Date/Time   NA 142 02/24/2014 0806   NA 137 12/13/2013 2359   K 3.7 02/24/2014 0806   K 3.8 12/13/2013 2359   CL 103 12/13/2013 2359   CO2 27 02/24/2014 0806   CO2 25 12/13/2013 2359   BUN 11.4 02/24/2014 0806   BUN 10 12/13/2013 2359   CREATININE 1.0 02/24/2014 0806   CREATININE 0.91 12/13/2013 2359      Component  Value Date/Time   CALCIUM 8.9 02/24/2014 0806   CALCIUM 8.3* 12/13/2013 2359   ALKPHOS 67 02/24/2014 0806   ALKPHOS 109 11/11/2013 1415   AST 12 02/24/2014 0806   AST 27 11/11/2013 1415   ALT 12 02/24/2014 0806   ALT 75* 11/11/2013 1415   BILITOT 0.53 02/24/2014 0806   BILITOT 0.4 11/11/2013 1415       RADIOGRAPHIC STUDIES: None  ASSESSMENT AND PLAN: this is a very pleasant 49 years old [place] American female recently diagnosed with a stage IIA gastric adenocarcinoma status post resection with lymph node dissection. She is currently undergoing adjuvant chemotherapy with 5-FU and leucovorin and tolerating her treatment fairly well. She is status post 1 full cycle of her treatment with 6 doses of weekly 5-FU and leucovorin. She is here for cycle 2 day 22 today. She is neutropenic and we will cancel today's dose of chemotherapy. Discussed neutropenic precautions with the patient. She was instructed to call us for a fever of 100.5 or higher. She was given Granix 300 g subcutaneous in our office today. She will receive a dose tomorrow at third dose on 02/26/2014.  She will come back for follow-up visit in 1 week for reevaluation and to determine if she should resume her chemotherapy at that time.  She was advised to call immediately if she has any concerning symptoms in the interval.  The patient voices understanding of current disease status and treatment options and is in agreement with the current care plan.  All questions were answered. The patient knows to call the clinic with any problems, questions or concerns. We can certainly see the patient much sooner if necessary.  Patient was seen and examined with Dr. [name].  Mikey Bussing, DNP, AGPCNP-BC, AOCNP  ADDENDUM: Hematology/Oncology Attending: I had a face to face encounter with the patient today. I recommended her care plan. This is a very pleasant 49 years old African-American female with a stage II a gastric  adenocarcinoma status post resection and currently undergoing adjuvant chemotherapy with weekly 5-FU on leucovorin. She is currently undergoing cycle #2. She missed her treatment last week because of abdominal pain that completely resolved. She was supposed to start her treatment today but unfortunately her absolute neutrophil count is low. We will skip her treatment today and resume next week. For the chemotherapy-induced neutropenia, I will start the patient on Granix 300 g subcutaneously for 3 days. She would come back for follow-up visit next week for reevaluation before resuming her systemic chemotherapy. She was advised to call immediately if she has any concerning symptoms in the interval.  Disclaimer: This note was dictated with voice recognition software. Similar sounding words can inadvertently be transcribed and may be missed upon review. Eilleen Kempf., MD 02/24/2014

## 2014-02-25 ENCOUNTER — Ambulatory Visit (HOSPITAL_BASED_OUTPATIENT_CLINIC_OR_DEPARTMENT_OTHER): Payer: No Typology Code available for payment source

## 2014-02-25 DIAGNOSIS — C169 Malignant neoplasm of stomach, unspecified: Secondary | ICD-10-CM

## 2014-02-25 DIAGNOSIS — D709 Neutropenia, unspecified: Secondary | ICD-10-CM

## 2014-02-25 DIAGNOSIS — D61818 Other pancytopenia: Secondary | ICD-10-CM

## 2014-02-25 MED ORDER — TBO-FILGRASTIM 300 MCG/0.5ML ~~LOC~~ SOSY
300.0000 ug | PREFILLED_SYRINGE | Freq: Once | SUBCUTANEOUS | Status: AC
  Administered 2014-02-25: 300 ug via SUBCUTANEOUS
  Filled 2014-02-25: qty 0.5

## 2014-02-25 NOTE — Patient Instructions (Signed)

## 2014-02-26 ENCOUNTER — Ambulatory Visit (HOSPITAL_BASED_OUTPATIENT_CLINIC_OR_DEPARTMENT_OTHER): Payer: No Typology Code available for payment source

## 2014-02-26 DIAGNOSIS — C169 Malignant neoplasm of stomach, unspecified: Secondary | ICD-10-CM

## 2014-02-26 DIAGNOSIS — D709 Neutropenia, unspecified: Secondary | ICD-10-CM

## 2014-02-26 DIAGNOSIS — D61818 Other pancytopenia: Secondary | ICD-10-CM

## 2014-02-26 MED ORDER — TBO-FILGRASTIM 300 MCG/0.5ML ~~LOC~~ SOSY
300.0000 ug | PREFILLED_SYRINGE | Freq: Once | SUBCUTANEOUS | Status: AC
  Administered 2014-02-26: 300 ug via SUBCUTANEOUS
  Filled 2014-02-26: qty 0.5

## 2014-02-26 NOTE — Patient Instructions (Signed)

## 2014-02-28 ENCOUNTER — Other Ambulatory Visit: Payer: Self-pay | Admitting: Physician Assistant

## 2014-02-28 DIAGNOSIS — C169 Malignant neoplasm of stomach, unspecified: Secondary | ICD-10-CM

## 2014-03-03 ENCOUNTER — Telehealth: Payer: Self-pay | Admitting: Physician Assistant

## 2014-03-03 ENCOUNTER — Ambulatory Visit (HOSPITAL_BASED_OUTPATIENT_CLINIC_OR_DEPARTMENT_OTHER): Payer: No Typology Code available for payment source | Admitting: Physician Assistant

## 2014-03-03 ENCOUNTER — Other Ambulatory Visit (HOSPITAL_BASED_OUTPATIENT_CLINIC_OR_DEPARTMENT_OTHER): Payer: No Typology Code available for payment source

## 2014-03-03 ENCOUNTER — Ambulatory Visit (HOSPITAL_BASED_OUTPATIENT_CLINIC_OR_DEPARTMENT_OTHER): Payer: No Typology Code available for payment source

## 2014-03-03 ENCOUNTER — Encounter: Payer: Self-pay | Admitting: Physician Assistant

## 2014-03-03 ENCOUNTER — Other Ambulatory Visit: Payer: Self-pay

## 2014-03-03 VITALS — BP 116/77 | HR 86 | Temp 97.7°F | Resp 18 | Ht 62.0 in | Wt 128.5 lb

## 2014-03-03 DIAGNOSIS — R220 Localized swelling, mass and lump, head: Secondary | ICD-10-CM | POA: Diagnosis not present

## 2014-03-03 DIAGNOSIS — C169 Malignant neoplasm of stomach, unspecified: Secondary | ICD-10-CM

## 2014-03-03 DIAGNOSIS — J018 Other acute sinusitis: Secondary | ICD-10-CM

## 2014-03-03 DIAGNOSIS — C163 Malignant neoplasm of pyloric antrum: Secondary | ICD-10-CM

## 2014-03-03 DIAGNOSIS — J3489 Other specified disorders of nose and nasal sinuses: Secondary | ICD-10-CM

## 2014-03-03 DIAGNOSIS — C779 Secondary and unspecified malignant neoplasm of lymph node, unspecified: Secondary | ICD-10-CM

## 2014-03-03 DIAGNOSIS — R0981 Nasal congestion: Secondary | ICD-10-CM

## 2014-03-03 DIAGNOSIS — Z5111 Encounter for antineoplastic chemotherapy: Secondary | ICD-10-CM

## 2014-03-03 LAB — CBC WITH DIFFERENTIAL/PLATELET
BASO%: 0.2 % (ref 0.0–2.0)
Basophils Absolute: 0 10*3/uL (ref 0.0–0.1)
EOS ABS: 0 10*3/uL (ref 0.0–0.5)
EOS%: 0.4 % (ref 0.0–7.0)
HEMATOCRIT: 30 % — AB (ref 34.8–46.6)
HEMOGLOBIN: 9.5 g/dL — AB (ref 11.6–15.9)
LYMPH#: 2.1 10*3/uL (ref 0.9–3.3)
LYMPH%: 37.6 % (ref 14.0–49.7)
MCH: 30.3 pg (ref 25.1–34.0)
MCHC: 31.7 g/dL (ref 31.5–36.0)
MCV: 95.5 fL (ref 79.5–101.0)
MONO#: 0.7 10*3/uL (ref 0.1–0.9)
MONO%: 12.2 % (ref 0.0–14.0)
NEUT#: 2.7 10*3/uL (ref 1.5–6.5)
NEUT%: 49.6 % (ref 38.4–76.8)
Platelets: 142 10*3/uL — ABNORMAL LOW (ref 145–400)
RBC: 3.14 10*6/uL — ABNORMAL LOW (ref 3.70–5.45)
RDW: 18.4 % — ABNORMAL HIGH (ref 11.2–14.5)
WBC: 5.5 10*3/uL (ref 3.9–10.3)

## 2014-03-03 LAB — COMPREHENSIVE METABOLIC PANEL (CC13)
ALBUMIN: 3.5 g/dL (ref 3.5–5.0)
ALT: 25 U/L (ref 0–55)
ANION GAP: 6 meq/L (ref 3–11)
AST: 24 U/L (ref 5–34)
Alkaline Phosphatase: 101 U/L (ref 40–150)
BUN: 15.6 mg/dL (ref 7.0–26.0)
CO2: 31 meq/L — AB (ref 22–29)
Calcium: 8.8 mg/dL (ref 8.4–10.4)
Chloride: 103 mEq/L (ref 98–109)
Creatinine: 1 mg/dL (ref 0.6–1.1)
EGFR: 80 mL/min/{1.73_m2} — ABNORMAL LOW (ref >90)
Glucose: 97 mg/dl (ref 70–140)
Potassium: 4.4 mEq/L (ref 3.5–5.1)
SODIUM: 140 meq/L (ref 136–145)
Total Bilirubin: 0.64 mg/dL (ref 0.20–1.20)
Total Protein: 6.1 g/dL — ABNORMAL LOW (ref 6.4–8.3)

## 2014-03-03 MED ORDER — LEUCOVORIN CALCIUM INJECTION 350 MG
494.0000 mg/m2 | Freq: Once | INTRAVENOUS | Status: AC
  Administered 2014-03-03: 800 mg via INTRAVENOUS
  Filled 2014-03-03: qty 40

## 2014-03-03 MED ORDER — AZITHROMYCIN 250 MG PO TABS: ORAL_TABLET | ORAL | Status: DC

## 2014-03-03 MED ORDER — SODIUM CHLORIDE 0.9 % IV SOLN
Freq: Once | INTRAVENOUS | Status: AC
  Administered 2014-03-03: 11:00:00 via INTRAVENOUS

## 2014-03-03 MED ORDER — PROCHLORPERAZINE MALEATE 10 MG PO TABS
10.0000 mg | ORAL_TABLET | Freq: Once | ORAL | Status: AC
  Administered 2014-03-03: 10 mg via ORAL

## 2014-03-03 MED ORDER — PROCHLORPERAZINE MALEATE 10 MG PO TABS
ORAL_TABLET | ORAL | Status: AC
  Filled 2014-03-03: qty 1

## 2014-03-03 MED ORDER — HEPARIN SOD (PORK) LOCK FLUSH 100 UNIT/ML IV SOLN
500.0000 [IU] | Freq: Once | INTRAVENOUS | Status: AC | PRN
  Administered 2014-03-03: 500 [IU]
  Filled 2014-03-03: qty 5

## 2014-03-03 MED ORDER — FLUOROURACIL CHEMO INJECTION 2.5 GM/50ML
600.0000 mg/m2 | Freq: Once | INTRAVENOUS | Status: AC
  Administered 2014-03-03: 950 mg via INTRAVENOUS
  Filled 2014-03-03: qty 19

## 2014-03-03 MED ORDER — AZITHROMYCIN 1 G PO PACK
1.0000 g | PACK | Freq: Once | ORAL | Status: DC
Start: 2014-03-03 — End: 2014-03-03

## 2014-03-03 MED ORDER — SODIUM CHLORIDE 0.9 % IJ SOLN
10.0000 mL | INTRAMUSCULAR | Status: DC | PRN
  Administered 2014-03-03: 10 mL
  Filled 2014-03-03: qty 10

## 2014-03-03 NOTE — Progress Notes (Signed)
[place] [phone]   [phone]  OFFICE PROGRESS NOTE  Arnoldo Morale, MD [address] [place] [address]  DIAGNOSIS: Stage IIA (T1a, N2, M0) gastric adenocarcinoma diagnosed in July of 2015  PRIOR [place] post diagnostic laparoscopy, distal gastrectomy with billroth II anastamosis and feeding gastrojejunostomy tube under the care of Dr. Barry Dienes on 09/20/2013.   CURRENT THERAPY: Adjuvant systemic chemotherapy with 5-FU 600 mg/M2 with leucovorin on days 1, 8, 15, 22, 29 and 36 every 8 weeks. First dose 11/19/2013.  INTERVAL HISTORY: Paula [name] 49 y.o. female returns to the clinic today for follow-up prior to cycle 2 day 15 of systemic chemotherapy with 5-FU and leucovorin. Her chemotherapy course has been complicated by Ms. or held doses for various reasons. Today she reports not feeling well complaining of facial swelling for the past week. She's had some sinus pain and congestion with yellowish nasal secretions. She reports some skin irritation related to the fentanyl patches and has discontinued their use.  She denied having any significant fever or chills. She denied having any significant shortness of breath or hemoptysis. She has no significant weight loss or night sweats.   MEDICAL HISTORY: Past Medical History  Diagnosis Date  . Headache(784.0) 2011    Migraines  . Esophageal reflux     On omeprazole  . Costochondritis   . Hypertension     started around 2011  . Ovarian cyst   . Stomach cancer   . Family history of cancer   . Heart murmur   . On antineoplastic chemotherapy     5-FU and leucovorin    ALLERGIES:  has No Known Allergies.  MEDICATIONS:  Current Outpatient Prescriptions  Medication Sig Dispense Refill  . carvedilol (COREG) 6.25 MG tablet Take 6.25 mg by mouth 2 (two) times daily with a meal.    . lidocaine (LIDODERM) 5 % 1 patch daily as needed.    . lidocaine-prilocaine (EMLA)  cream Apply 1 application topically as needed. Apply to port 1 hour before chemo 30 g 0  . LORazepam (ATIVAN) 1 MG tablet Take 1 tablet (1 mg total) by mouth every 8 (eight) hours as needed for anxiety. 30 tablet 0  . metoCLOPramide (REGLAN) 10 MG tablet Take 1 tablet (10 mg total) by mouth 4 (four) times daily. 120 tablet 3  . Multiple Vitamins-Minerals (MULTIVITAMIN & MINERAL PO) Take 1 tablet by mouth daily.    . Nutritional Supplements (ENSURE COMPLETE PO) Take 237 mLs by mouth daily. Takes a little at a time, in order to keep it down.    . oxyCODONE (OXY IR/ROXICODONE) 5 MG immediate release tablet Take 1 tablet (5 mg total) by mouth every 4 (four) hours as needed for severe pain. 45 tablet 0  . pantoprazole (PROTONIX) 40 MG tablet Take 1 tablet (40 mg total) by mouth 2 (two) times daily before a meal. 60 tablet 3  . azithromycin (ZITHROMAX Z-PAK) 250 MG tablet Take as directed 6 each 0  . fentaNYL (DURAGESIC - DOSED MCG/HR) 25 MCG/HR patch Place 1 patch (25 mcg total) onto the skin every 3 (three) days. (Patient not taking: Reported on 03/03/2014) 10 patch 0  . OLANZapine zydis (ZYPREXA) 5 MG disintegrating tablet Take 1 tablet (5 mg total) by mouth at bedtime. (Patient not taking: Reported on 03/03/2014) 30 tablet 3  . ondansetron (ZOFRAN-ODT) 8 MG disintegrating tablet Take 1 tablet (8 mg total) by mouth every 8 (eight) hours  as needed for nausea or vomiting. (Patient not taking: Reported on 03/03/2014) 20 tablet 0   No current facility-administered medications for this visit.   Facility-Administered Medications Ordered in Other Visits  Medication Dose Route Frequency Provider Last Rate Last Dose  . fluorouracil (ADRUCIL) chemo injection 950 mg  600 mg/m2 (Treatment Plan Actual) Intravenous Once Curt Bears, MD      . heparin lock flush 100 unit/mL  500 Units Intracatheter Once PRN Curt Bears, MD      . leucovorin 800 mg in dextrose 5 % 250 mL infusion  494 mg/m2 (Treatment Plan  Actual) Intravenous Once Curt Bears, MD 145 mL/hr at 03/03/14 1200 800 mg at 03/03/14 1200  . sodium chloride 0.9 % injection 10 mL  10 mL Intracatheter PRN Curt Bears, MD        SURGICAL HISTORY:  Past Surgical History  Procedure Laterality Date  . Oophorectomy      Around 1985 left ovary removal  . Tonsillectomy      Around 1994  . Tubal ligation    . Esophagogastroduodenoscopy N/A 09/16/2013    Procedure: ESOPHAGOGASTRODUODENOSCOPY (EGD);  Surgeon: Wonda Horner, MD;  Location: [facility];  Service: Endoscopy;  Laterality: N/A;  . Laparoscopy N/A 09/20/2013    Procedure: DIAGNOSTIC LAPAROSCOPY,DISTAL GASTRECTOMY AND FEEDING GASTROJEJUNOSTOMY;  Surgeon: [name], MD;  Location: [facility];  Service: General;  Laterality: N/A;  . Portacath placement Left 11/18/2013    Procedure: INSERTION PORT-A-CATH;  Surgeon: [name], MD;  Location: [facility];  Service: General;  Laterality: Left;    REVIEW OF SYSTEMS:  Constitutional: negative except for anorexia, fatigue and malaise Eyes: negative Ears, nose, mouth, throat, and face: positive for nasal congestion and Sinus pain and congestion with some facial swelling Respiratory: negative Cardiovascular: negative Gastrointestinal: negative except for constipation, nausea and vomiting Genitourinary:negative Integument/breast: negative Hematologic/lymphatic: negative Musculoskeletal:negative Neurological: negative   PHYSICAL EXAMINATION: General appearance: alert, cooperative, fatigued and no distress Head: Normocephalic, without obvious abnormality, atraumatic, sinuses tender to percussion, Mild soft tissue edema about the maxillary sinus area bilaterally Neck: mild anterior cervical adenopathy, no JVD, supple, symmetrical, trachea midline and thyroid not enlarged, symmetric, no tenderness/mass/nodules Lymph nodes: Cervical adenopathy: 1+ mildly tender Resp: clear to auscultation bilaterally Back: symmetric, no curvature. ROM  normal. No CVA tenderness. Cardio: regular rate and rhythm, S1, S2 normal, no murmur, click, rub or gallop GI: soft, non-tender; bowel sounds normal; no masses,  no organomegaly Extremities: extremities normal, atraumatic, no cyanosis or edema Neurologic: Alert and oriented X 3, normal strength and tone. Normal symmetric reflexes. Normal coordination and gait  ECOG PERFORMANCE STATUS: 1 - Symptomatic but completely ambulatory  Blood pressure 116/77, pulse 86, temperature 97.7 F (36.5 C), temperature source Oral, resp. rate 18, height 5\' 2"  (1.575 m), weight 128 lb 8 oz (58.287 kg), SpO2 100 %.  LABORATORY DATA: Lab Results  Component Value Date   WBC 5.5 03/03/2014   HGB 9.5* 03/03/2014   HCT 30.0* 03/03/2014   MCV 95.5 03/03/2014   PLT 142* 03/03/2014      Chemistry      Component Value Date/Time   NA 140 03/03/2014 0912   NA 137 12/13/2013 2359   K 4.4 03/03/2014 0912   K 3.8 12/13/2013 2359   CL 103 12/13/2013 2359   CO2 31* 03/03/2014 0912   CO2 25 12/13/2013 2359   BUN 15.6 03/03/2014 0912   BUN 10 12/13/2013 2359   CREATININE 1.0 03/03/2014 0912   CREATININE 0.91 12/13/2013 2359  Component Value Date/Time   CALCIUM 8.8 03/03/2014 0912   CALCIUM 8.3* 12/13/2013 2359   ALKPHOS 101 03/03/2014 0912   ALKPHOS 109 11/11/2013 1415   AST 24 03/03/2014 0912   AST 27 11/11/2013 1415   ALT 25 03/03/2014 0912   ALT 75* 11/11/2013 1415   BILITOT 0.64 03/03/2014 0912   BILITOT 0.4 11/11/2013 1415       RADIOGRAPHIC STUDIES: None  ASSESSMENT AND PLAN: this is a very pleasant 49 years old [place] American female recently diagnosed with a stage IIA gastric adenocarcinoma status post resection with lymph node dissection. She is currently undergoing adjuvant chemotherapy with 5-FU and leucovorin and tolerating her treatment fairly well. She is status post 1 full cycle of her treatment with 6 doses of weekly 5-FU and leucovorin. She is here for cycle 2 day 15 today.  Patient was discussed with and also seen by Dr. [name]. She'll proceed with day 15 of cycle #2 today as scheduled. We will treat her for an additional week, (day 22 cycle #2) and then we will obtained a restaging CT scan of the abdomen and pelvis with contrast to reevaluate her disease. For the suspected sinusitis patient will be placed on a Z-Pak and a prescription for this medication was sent her pharmacy of record via E scribe. She'll follow-up in 3 weeks with a restaging CT scan of the abdomen and pelvis with contrast to reevaluate her disease.   She was advised to call immediately if she has any concerning symptoms in the interval.  The patient voices understanding of current disease status and treatment options and is in agreement with the current care plan.  All questions were answered. The patient knows to call the clinic with any problems, questions or concerns. We can certainly see the patient much sooner if necessary.   Carlton Adam, PA-C 03/03/2014

## 2014-03-03 NOTE — Patient Instructions (Signed)
[facility] Discharge Instructions for Patients Receiving Chemotherapy  Today you received the following chemotherapy agents: Leucovorin and 5FU   To help prevent nausea and vomiting after your treatment, we encourage you to take your nausea medication as prescribed.    If you develop nausea and vomiting that is not controlled by your nausea medication, call the clinic.   BELOW ARE SYMPTOMS THAT SHOULD BE REPORTED IMMEDIATELY:  *FEVER GREATER THAN 100.5 F  *CHILLS WITH OR WITHOUT FEVER  NAUSEA AND VOMITING THAT IS NOT CONTROLLED WITH YOUR NAUSEA MEDICATION  *UNUSUAL SHORTNESS OF BREATH  *UNUSUAL BRUISING OR BLEEDING  TENDERNESS IN MOUTH AND THROAT WITH OR WITHOUT PRESENCE OF ULCERS  *URINARY PROBLEMS  *BOWEL PROBLEMS  UNUSUAL RASH Items with * indicate a potential emergency and should be followed up as soon as possible.  Feel free to call the clinic you have any questions or concerns. The clinic phone number is [phone].    

## 2014-03-03 NOTE — Telephone Encounter (Signed)
Gave avs & cal for Jan/Feb. °

## 2014-03-07 ENCOUNTER — Other Ambulatory Visit: Payer: Self-pay | Admitting: *Deleted

## 2014-03-07 DIAGNOSIS — C169 Malignant neoplasm of stomach, unspecified: Secondary | ICD-10-CM

## 2014-03-10 ENCOUNTER — Ambulatory Visit (HOSPITAL_BASED_OUTPATIENT_CLINIC_OR_DEPARTMENT_OTHER): Payer: No Typology Code available for payment source

## 2014-03-10 ENCOUNTER — Other Ambulatory Visit (HOSPITAL_BASED_OUTPATIENT_CLINIC_OR_DEPARTMENT_OTHER): Payer: No Typology Code available for payment source

## 2014-03-10 DIAGNOSIS — C163 Malignant neoplasm of pyloric antrum: Secondary | ICD-10-CM

## 2014-03-10 DIAGNOSIS — C169 Malignant neoplasm of stomach, unspecified: Secondary | ICD-10-CM

## 2014-03-10 DIAGNOSIS — Z5111 Encounter for antineoplastic chemotherapy: Secondary | ICD-10-CM

## 2014-03-10 LAB — CBC WITH DIFFERENTIAL/PLATELET
BASO%: 1.1 % (ref 0.0–2.0)
Basophils Absolute: 0 10*3/uL (ref 0.0–0.1)
EOS%: 0.3 % (ref 0.0–7.0)
Eosinophils Absolute: 0 10*3/uL (ref 0.0–0.5)
HEMATOCRIT: 27 % — AB (ref 34.8–46.6)
HEMOGLOBIN: 8.5 g/dL — AB (ref 11.6–15.9)
LYMPH%: 47.2 % (ref 14.0–49.7)
MCH: 30.4 pg (ref 25.1–34.0)
MCHC: 31.6 g/dL (ref 31.5–36.0)
MCV: 96.1 fL (ref 79.5–101.0)
MONO#: 0.1 10*3/uL (ref 0.1–0.9)
MONO%: 2.1 % (ref 0.0–14.0)
NEUT#: 2 10*3/uL (ref 1.5–6.5)
NEUT%: 49.3 % (ref 38.4–76.8)
PLATELETS: 196 10*3/uL (ref 145–400)
RBC: 2.81 10*6/uL — AB (ref 3.70–5.45)
RDW: 17.6 % — AB (ref 11.2–14.5)
WBC: 4 10*3/uL (ref 3.9–10.3)
lymph#: 1.9 10*3/uL (ref 0.9–3.3)

## 2014-03-10 LAB — COMPREHENSIVE METABOLIC PANEL (CC13)
ALBUMIN: 3.8 g/dL (ref 3.5–5.0)
ALT: 17 U/L (ref 0–55)
AST: 13 U/L (ref 5–34)
Alkaline Phosphatase: 74 U/L (ref 40–150)
Anion Gap: 8 mEq/L (ref 3–11)
BUN: 11.2 mg/dL (ref 7.0–26.0)
CHLORIDE: 106 meq/L (ref 98–109)
CO2: 27 meq/L (ref 22–29)
CREATININE: 1 mg/dL (ref 0.6–1.1)
Calcium: 9 mg/dL (ref 8.4–10.4)
EGFR: 81 mL/min/{1.73_m2} — ABNORMAL LOW (ref >90)
Glucose: 109 mg/dl (ref 70–140)
Potassium: 4.1 mEq/L (ref 3.5–5.1)
SODIUM: 141 meq/L (ref 136–145)
TOTAL PROTEIN: 6.5 g/dL (ref 6.4–8.3)
Total Bilirubin: 0.61 mg/dL (ref 0.20–1.20)

## 2014-03-10 MED ORDER — SODIUM CHLORIDE 0.9 % IJ SOLN
10.0000 mL | INTRAMUSCULAR | Status: DC | PRN
  Administered 2014-03-10: 10 mL
  Filled 2014-03-10: qty 10

## 2014-03-10 MED ORDER — HEPARIN SOD (PORK) LOCK FLUSH 100 UNIT/ML IV SOLN
500.0000 [IU] | Freq: Once | INTRAVENOUS | Status: AC | PRN
Start: 2014-03-10 — End: 2014-03-10
  Administered 2014-03-10: 500 [IU]
  Filled 2014-03-10: qty 5

## 2014-03-10 MED ORDER — PROCHLORPERAZINE MALEATE 10 MG PO TABS
10.0000 mg | ORAL_TABLET | Freq: Once | ORAL | Status: AC
  Administered 2014-03-10: 10 mg via ORAL

## 2014-03-10 MED ORDER — PROCHLORPERAZINE MALEATE 10 MG PO TABS
ORAL_TABLET | ORAL | Status: AC
  Filled 2014-03-10: qty 1

## 2014-03-10 MED ORDER — SODIUM CHLORIDE 0.9 % IV SOLN
Freq: Once | INTRAVENOUS | Status: AC
  Administered 2014-03-10: 09:00:00 via INTRAVENOUS

## 2014-03-10 MED ORDER — LEUCOVORIN CALCIUM INJECTION 350 MG
500.0000 mg/m2 | Freq: Once | INTRAVENOUS | Status: AC
  Administered 2014-03-10: 810 mg via INTRAVENOUS
  Filled 2014-03-10: qty 40.5

## 2014-03-10 MED ORDER — FLUOROURACIL CHEMO INJECTION 2.5 GM/50ML
600.0000 mg/m2 | Freq: Once | INTRAVENOUS | Status: AC
  Administered 2014-03-10: 950 mg via INTRAVENOUS
  Filled 2014-03-10: qty 19

## 2014-03-10 NOTE — Patient Instructions (Signed)
[facility] Discharge Instructions for Patients Receiving Chemotherapy  Today you received the following chemotherapy agents Leucovorin and 5FU  To help prevent nausea and vomiting after your treatment, we encourage you to take your nausea medication Zofran ODT as directed   If you develop nausea and vomiting that is not controlled by your nausea medication, call the clinic.   BELOW ARE SYMPTOMS THAT SHOULD BE REPORTED IMMEDIATELY:  *FEVER GREATER THAN 100.5 F  *CHILLS WITH OR WITHOUT FEVER  NAUSEA AND VOMITING THAT IS NOT CONTROLLED WITH YOUR NAUSEA MEDICATION  *UNUSUAL SHORTNESS OF BREATH  *UNUSUAL BRUISING OR BLEEDING  TENDERNESS IN MOUTH AND THROAT WITH OR WITHOUT PRESENCE OF ULCERS  *URINARY PROBLEMS  *BOWEL PROBLEMS  UNUSUAL RASH Items with * indicate a potential emergency and should be followed up as soon as possible.  Feel free to call the clinic you have any questions or concerns. The clinic phone number is [phone].

## 2014-03-13 ENCOUNTER — Other Ambulatory Visit: Payer: Self-pay | Admitting: Medical Oncology

## 2014-03-13 DIAGNOSIS — C169 Malignant neoplasm of stomach, unspecified: Secondary | ICD-10-CM

## 2014-03-17 ENCOUNTER — Ambulatory Visit: Payer: Self-pay

## 2014-03-17 ENCOUNTER — Other Ambulatory Visit: Payer: Self-pay

## 2014-03-20 ENCOUNTER — Ambulatory Visit (HOSPITAL_BASED_OUTPATIENT_CLINIC_OR_DEPARTMENT_OTHER): Payer: No Typology Code available for payment source

## 2014-03-20 DIAGNOSIS — C163 Malignant neoplasm of pyloric antrum: Secondary | ICD-10-CM

## 2014-03-20 DIAGNOSIS — C169 Malignant neoplasm of stomach, unspecified: Secondary | ICD-10-CM

## 2014-03-20 LAB — COMPREHENSIVE METABOLIC PANEL (CC13)
ALBUMIN: 4.3 g/dL (ref 3.5–5.0)
ALT: 15 U/L (ref 0–55)
AST: 17 U/L (ref 5–34)
Alkaline Phosphatase: 89 U/L (ref 40–150)
Anion Gap: 9 mEq/L (ref 3–11)
BUN: 8.6 mg/dL (ref 7.0–26.0)
CO2: 25 meq/L (ref 22–29)
Calcium: 9.5 mg/dL (ref 8.4–10.4)
Chloride: 105 mEq/L (ref 98–109)
Creatinine: 1.1 mg/dL (ref 0.6–1.1)
EGFR: 67 mL/min/{1.73_m2} — ABNORMAL LOW (ref >90)
Glucose: 98 mg/dl (ref 70–140)
POTASSIUM: 4.1 meq/L (ref 3.5–5.1)
Sodium: 138 mEq/L (ref 136–145)
Total Bilirubin: 1.12 mg/dL (ref 0.20–1.20)
Total Protein: 7.2 g/dL (ref 6.4–8.3)

## 2014-03-20 LAB — CBC WITH DIFFERENTIAL/PLATELET
BASO%: 0.9 % (ref 0.0–2.0)
BASOS ABS: 0 10*3/uL (ref 0.0–0.1)
EOS ABS: 0.2 10*3/uL (ref 0.0–0.5)
EOS%: 5.1 % (ref 0.0–7.0)
HEMATOCRIT: 29 % — AB (ref 34.8–46.6)
HGB: 9.2 g/dL — ABNORMAL LOW (ref 11.6–15.9)
LYMPH%: 58.5 % — ABNORMAL HIGH (ref 14.0–49.7)
MCH: 30.2 pg (ref 25.1–34.0)
MCHC: 31.6 g/dL (ref 31.5–36.0)
MCV: 95.7 fL (ref 79.5–101.0)
MONO#: 0.3 10*3/uL (ref 0.1–0.9)
MONO%: 7.1 % (ref 0.0–14.0)
NEUT%: 28.4 % — AB (ref 38.4–76.8)
NEUTROS ABS: 1.2 10*3/uL — AB (ref 1.5–6.5)
Platelets: 269 10*3/uL (ref 145–400)
RBC: 3.03 10*6/uL — ABNORMAL LOW (ref 3.70–5.45)
RDW: 17.9 % — ABNORMAL HIGH (ref 11.2–14.5)
WBC: 4.2 10*3/uL (ref 3.9–10.3)
lymph#: 2.4 10*3/uL (ref 0.9–3.3)

## 2014-03-21 ENCOUNTER — Other Ambulatory Visit: Payer: Self-pay

## 2014-03-21 ENCOUNTER — Other Ambulatory Visit: Payer: Self-pay | Admitting: Physician Assistant

## 2014-03-21 ENCOUNTER — Other Ambulatory Visit: Payer: Self-pay | Admitting: Internal Medicine

## 2014-03-21 ENCOUNTER — Other Ambulatory Visit: Payer: Self-pay | Admitting: Medical Oncology

## 2014-03-21 ENCOUNTER — Ambulatory Visit (HOSPITAL_COMMUNITY)
Admission: RE | Admit: 2014-03-21 | Discharge: 2014-03-21 | Disposition: A | Discharge status: Home / Self Care | Payer: No Typology Code available for payment source | Source: Ambulatory Visit | Attending: Physician Assistant | Admitting: Physician Assistant

## 2014-03-21 DIAGNOSIS — R110 Nausea: Secondary | ICD-10-CM | POA: Insufficient documentation

## 2014-03-21 DIAGNOSIS — R197 Diarrhea, unspecified: Secondary | ICD-10-CM | POA: Diagnosis not present

## 2014-03-21 DIAGNOSIS — C169 Malignant neoplasm of stomach, unspecified: Secondary | ICD-10-CM | POA: Diagnosis not present

## 2014-03-21 DIAGNOSIS — Z9221 Personal history of antineoplastic chemotherapy: Secondary | ICD-10-CM | POA: Insufficient documentation

## 2014-03-21 MED ORDER — IOHEXOL 300 MG/ML  SOLN
100.0000 mL | Freq: Once | INTRAMUSCULAR | Status: AC | PRN
  Administered 2014-03-21: 100 mL via INTRAVENOUS

## 2014-03-24 ENCOUNTER — Ambulatory Visit: Payer: Self-pay | Admitting: Physician Assistant

## 2014-03-24 ENCOUNTER — Other Ambulatory Visit: Payer: Self-pay

## 2014-03-25 ENCOUNTER — Telehealth: Payer: Self-pay | Admitting: Internal Medicine

## 2014-03-25 ENCOUNTER — Telehealth: Payer: Self-pay

## 2014-03-25 NOTE — Telephone Encounter (Signed)
Left message to confirm appointment rescheduled from 02/01 to 02/08.

## 2014-03-25 NOTE — Telephone Encounter (Signed)
Called and LM for pt regarding missed appt yesterday 03/24/14. Informed her scheduling to be calling to r/s. POF sent to schedule pt with MD per Murvin Donning.

## 2014-03-30 ENCOUNTER — Other Ambulatory Visit: Payer: Self-pay | Admitting: Internal Medicine

## 2014-03-31 ENCOUNTER — Ambulatory Visit (HOSPITAL_BASED_OUTPATIENT_CLINIC_OR_DEPARTMENT_OTHER): Payer: Medicaid Other | Admitting: Physician Assistant

## 2014-03-31 ENCOUNTER — Other Ambulatory Visit: Payer: Self-pay | Admitting: Physician Assistant

## 2014-03-31 ENCOUNTER — Encounter: Payer: Self-pay | Admitting: Physician Assistant

## 2014-03-31 ENCOUNTER — Telehealth: Payer: Self-pay | Admitting: Physician Assistant

## 2014-03-31 ENCOUNTER — Other Ambulatory Visit (HOSPITAL_BASED_OUTPATIENT_CLINIC_OR_DEPARTMENT_OTHER): Payer: Medicaid Other

## 2014-03-31 VITALS — BP 123/77 | HR 74 | Temp 97.9°F | Resp 17 | Ht 62.0 in | Wt 122.4 lb

## 2014-03-31 DIAGNOSIS — C169 Malignant neoplasm of stomach, unspecified: Secondary | ICD-10-CM

## 2014-03-31 LAB — CBC WITH DIFFERENTIAL/PLATELET
BASO%: 1 % (ref 0.0–2.0)
Basophils Absolute: 0 10*3/uL (ref 0.0–0.1)
EOS%: 2.4 % (ref 0.0–7.0)
Eosinophils Absolute: 0.1 10*3/uL (ref 0.0–0.5)
HCT: 33.6 % — ABNORMAL LOW (ref 34.8–46.6)
HEMOGLOBIN: 10.3 g/dL — AB (ref 11.6–15.9)
LYMPH%: 60.3 % — ABNORMAL HIGH (ref 14.0–49.7)
MCH: 30.1 pg (ref 25.1–34.0)
MCHC: 30.5 g/dL — AB (ref 31.5–36.0)
MCV: 98.7 fL (ref 79.5–101.0)
MONO#: 0.4 10*3/uL (ref 0.1–0.9)
MONO%: 15.5 % — ABNORMAL HIGH (ref 0.0–14.0)
NEUT#: 0.6 10*3/uL — ABNORMAL LOW (ref 1.5–6.5)
NEUT%: 20.8 % — ABNORMAL LOW (ref 38.4–76.8)
Platelets: 263 10*3/uL (ref 145–400)
RBC: 3.41 10*6/uL — ABNORMAL LOW (ref 3.70–5.45)
RDW: 19.2 % — ABNORMAL HIGH (ref 11.2–14.5)
WBC: 2.8 10*3/uL — AB (ref 3.9–10.3)
lymph#: 1.7 10*3/uL (ref 0.9–3.3)

## 2014-03-31 LAB — COMPREHENSIVE METABOLIC PANEL (CC13)
ALT: 15 U/L (ref 0–55)
AST: 17 U/L (ref 5–34)
Albumin: 3.9 g/dL (ref 3.5–5.0)
Alkaline Phosphatase: 89 U/L (ref 40–150)
Anion Gap: 12 mEq/L — ABNORMAL HIGH (ref 3–11)
BILIRUBIN TOTAL: 0.63 mg/dL (ref 0.20–1.20)
BUN: 11.2 mg/dL (ref 7.0–26.0)
CALCIUM: 9.2 mg/dL (ref 8.4–10.4)
CO2: 24 meq/L (ref 22–29)
Chloride: 104 mEq/L (ref 98–109)
Creatinine: 1 mg/dL (ref 0.6–1.1)
EGFR: 74 mL/min/{1.73_m2} — AB (ref >90)
Glucose: 106 mg/dl (ref 70–140)
Potassium: 3.9 mEq/L (ref 3.5–5.1)
SODIUM: 139 meq/L (ref 136–145)
Total Protein: 6.9 g/dL (ref 6.4–8.3)

## 2014-03-31 MED ORDER — LORAZEPAM 1 MG PO TABS: 1.0000 mg | ORAL_TABLET | Freq: Three times a day (TID) | ORAL | Status: DC | PRN

## 2014-03-31 NOTE — Progress Notes (Signed)
[place] [phone]   [phone]  OFFICE PROGRESS NOTE  Arnoldo Morale, MD [address] [place] [address]  DIAGNOSIS: Stage IIA (T1a, N2, M0) gastric adenocarcinoma diagnosed in July of 2015  PRIOR [place] post diagnostic laparoscopy, distal gastrectomy with billroth II anastamosis and feeding gastrojejunostomy tube under the care of Dr. Barry Dienes on 09/20/2013.   CURRENT THERAPY: Adjuvant systemic chemotherapy with 5-FU 600 mg/M2 with leucovorin on days 1, 8, 15, 22, 29 and 36 every 8 weeks. First dose 11/19/2013. Status post 2 cycles.  INTERVAL HISTORY: Paula [name] 49 y.o. female returns to the clinic today for follow-up  Visit to discuss her recent restaging CT scan of her abdomen and pelvis.  She requests a refill for her ativan tablets. She continues to have some mild abdominal pain and early satiety..She denied having any significant fever or chills. She denied having any significant shortness of breath or hemoptysis. She has no significant weight loss or night sweats. She still has her port-a-cath and is wondering when it can be removed.  MEDICAL HISTORY: Past Medical History  Diagnosis Date  . Headache(784.0) 2011    Migraines  . Esophageal reflux     On omeprazole  . Costochondritis   . Hypertension     started around 2011  . Ovarian cyst   . Stomach cancer   . Family history of cancer   . Heart murmur   . On antineoplastic chemotherapy     5-FU and leucovorin    ALLERGIES:  has No Known Allergies.  MEDICATIONS:  Current Outpatient Prescriptions  Medication Sig Dispense Refill  . carvedilol (COREG) 6.25 MG tablet Take 6.25 mg by mouth 2 (two) times daily with a meal.    . lidocaine (LIDODERM) 5 % 1 patch daily as needed.    . lidocaine-prilocaine (EMLA) cream Apply 1 application topically as needed. Apply to port 1 hour before chemo 30 g 0  . LORazepam (ATIVAN) 1 MG tablet Take 1 tablet (1 mg  total) by mouth every 8 (eight) hours as needed for anxiety. 30 tablet 0  . metoCLOPramide (REGLAN) 10 MG tablet Take 1 tablet (10 mg total) by mouth 4 (four) times daily. 120 tablet 3  . Multiple Vitamins-Minerals (MULTIVITAMIN & MINERAL PO) Take 1 tablet by mouth daily.    . Nutritional Supplements (ENSURE COMPLETE PO) Take 237 mLs by mouth daily. Takes a little at a time, in order to keep it down.    Marland Kitchen OLANZapine zydis (ZYPREXA) 5 MG disintegrating tablet Take 1 tablet (5 mg total) by mouth at bedtime. 30 tablet 3  . ondansetron (ZOFRAN-ODT) 8 MG disintegrating tablet Take 1 tablet (8 mg total) by mouth every 8 (eight) hours as needed for nausea or vomiting. 20 tablet 0  . oxyCODONE (OXY IR/ROXICODONE) 5 MG immediate release tablet Take 1 tablet (5 mg total) by mouth every 4 (four) hours as needed for severe pain. 45 tablet 0  . pantoprazole (PROTONIX) 40 MG tablet Take 1 tablet (40 mg total) by mouth 2 (two) times daily before a meal. 60 tablet 3  . fentaNYL (DURAGESIC - DOSED MCG/HR) 25 MCG/HR patch Place 1 patch (25 mcg total) onto the skin every 3 (three) days. (Patient not taking: Reported on 03/03/2014) 10 patch 0   No current facility-administered medications for this visit.    SURGICAL HISTORY:  Past Surgical History  Procedure Laterality Date  . Oophorectomy      Around  1985 left ovary removal  . Tonsillectomy      Around 1994  . Tubal ligation    . Esophagogastroduodenoscopy N/A 09/16/2013    Procedure: ESOPHAGOGASTRODUODENOSCOPY (EGD);  Surgeon: Wonda Horner, MD;  Location: [facility];  Service: Endoscopy;  Laterality: N/A;  . Laparoscopy N/A 09/20/2013    Procedure: DIAGNOSTIC LAPAROSCOPY,DISTAL GASTRECTOMY AND FEEDING GASTROJEJUNOSTOMY;  Surgeon: [name], MD;  Location: [facility];  Service: General;  Laterality: N/A;  . Portacath placement Left 11/18/2013    Procedure: INSERTION PORT-A-CATH;  Surgeon: [name], MD;  Location: [facility];  Service: General;  Laterality:  Left;    REVIEW OF SYSTEMS:  Constitutional: positive for fatigue Eyes: negative Ears, nose, mouth, throat, and face: negative Respiratory: negative Cardiovascular: negative Gastrointestinal: positive for abdominal pain and mild and reports getting full quickly Genitourinary:negative Integument/breast: negative Hematologic/lymphatic: negative Musculoskeletal:negative Neurological: negative   PHYSICAL EXAMINATION: General appearance: alert, cooperative, fatigued and no distress Head: Normocephalic, without obvious abnormality, atraumatic, sinuses tender to percussion, Mild soft tissue edema about the maxillary sinus area bilaterally Neck: mild anterior cervical adenopathy, no JVD, supple, symmetrical, trachea midline and thyroid not enlarged, symmetric, no tenderness/mass/nodules Lymph nodes: Cervical adenopathy: 1+ mildly tender Resp: clear to auscultation bilaterally Back: symmetric, no curvature. ROM normal. No CVA tenderness. Cardio: regular rate and rhythm, S1, S2 normal, no murmur, click, rub or gallop GI: soft, non-tender; bowel sounds normal; no masses,  no organomegaly Extremities: extremities normal, atraumatic, no cyanosis or edema Neurologic: Alert and oriented X 3, normal strength and tone. Normal symmetric reflexes. Normal coordination and gait  ECOG PERFORMANCE STATUS: 1 - Symptomatic but completely ambulatory  Blood pressure 123/77, pulse 74, temperature 97.9 F (36.6 C), temperature source Oral, resp. rate 17, height 5' 2"  (1.575 m), weight 122 lb 6.4 oz (55.52 kg), SpO2 98 %.  LABORATORY DATA: Lab Results  Component Value Date   WBC 2.8* 03/31/2014   HGB 10.3* 03/31/2014   HCT 33.6* 03/31/2014   MCV 98.7 03/31/2014   PLT 263 03/31/2014      Chemistry      Component Value Date/Time   NA 139 03/31/2014 1331   NA 137 12/13/2013 2359   K 3.9 03/31/2014 1331   K 3.8 12/13/2013 2359   CL 103 12/13/2013 2359   CO2 24 03/31/2014 1331   CO2 25 12/13/2013 2359    BUN 11.2 03/31/2014 1331   BUN 10 12/13/2013 2359   CREATININE 1.0 03/31/2014 1331   CREATININE 0.91 12/13/2013 2359      Component Value Date/Time   CALCIUM 9.2 03/31/2014 1331   CALCIUM 8.3* 12/13/2013 2359   ALKPHOS 89 03/31/2014 1331   ALKPHOS 109 11/11/2013 1415   AST 17 03/31/2014 1331   AST 27 11/11/2013 1415   ALT 15 03/31/2014 1331   ALT 75* 11/11/2013 1415   BILITOT 0.63 03/31/2014 1331   BILITOT 0.4 11/11/2013 1415       RADIOGRAPHIC STUDIES: Ct Abdomen Pelvis W Contrast  03/21/2014   CLINICAL DATA:  Gastric carcinoma diagnosed 7/15. Chemotherapy complete. Distal gastrectomy. Nausea. Diarrhea.  EXAM: CT ABDOMEN AND PELVIS WITH CONTRAST  TECHNIQUE: Multidetector CT imaging of the abdomen and pelvis was performed using the standard protocol following bolus administration of intravenous contrast.  CONTRAST:  158m OMNIPAQUE IOHEXOL 300 MG/ML  SOLN  COMPARISON:  12/12/2013.  FINDINGS: Lower chest: Right base scarring. Normal heart size without pericardial or pleural effusion.  Hepatobiliary: Normal liver. Normal gallbladder, without biliary ductal dilatation.  Pancreas: Mild pancreatic atrophy.  No duct dilatation or mass.  Spleen: Normal  Adrenals/Urinary Tract: Normal adrenal glands. Too small to characterize right renal lesion. Normal left kidney. No hydronephrosis. Normal urinary bladder.  Stomach/Bowel: Status post gastric jejunostomy. The gastric remnant is underdistended. Appears thick walled, including images 20-24 of series 2.  Colonic stool burden suggests constipation. Normal terminal ileum and appendix. Normal small bowel.  Vascular/Lymphatic: Separate origins of the splenic and common hepatic arteries. Normal caliber of the aorta and branch vessels. No abdominopelvic adenopathy.  Reproductive: Retroverted uterus. Fundal fibroid measures 3.2 cm and is likely similar. No adnexal mass.  Other: No significant free fluid. A subtle 5 mm pericolonic nodule within the right side  of the omentum on image 38 was present on the prior exam. Also similar on 09/13/2013.  Musculoskeletal: Transitional lumbosacral anatomy. No acute osseous abnormality.  IMPRESSION: 1. Status post gastrojejunostomy, with removal of gastrostomy tube. Underdistention of the gastric remnant with apparent wall thickening. Cannot exclude either gastritis or locally recurrent disease. If recent endoscopy has not been performed, this should be considered. 2. No adenopathy to suggest nodal metastasis. A subtle nodule within the right abdominal omentum has been present back to 09/13/2013 and is favored to be benign. Recommend attention on follow-up. 3. The liver lesion detailed on the prior exam is not identified today. This may have represented an artifact or area of focal steatosis. Treatment of an isolated hepatic metastasis is felt less likely.   Electronically Signed   By: Abigail Miyamoto M.D.   On: 03/21/2014 14:22    ASSESSMENT AND PLAN: this is a very pleasant 49 years old [place] American female recently diagnosed with a stage IIA gastric adenocarcinoma status post resection with lymph node dissection. She is status post 2 cycles of adjuvant chemotherapy with 5-FU and leucovorin. Patient was discussed with and also seen by Dr. [name]. Her recent restaging CT scan of the abdomen and pelvis with contrast revealed an area that was suspicious for gastritis versus local disease recurrence. We will refer her back to Dr. Penelope Coop for another endoscopy to help [place] determine whether this is gastritis versus local disease recurrence. We'll also refer her to Dr. Isidore Moos for radiation therapy. She will be on oral chemotherapy while she is receiving radiation therapy. She'll follow-up with Dr. [name] in 3 weeks with a repeat CBC differential and C met. She is given a refill prescription for her Ativan tablets.  She was advised to call immediately if she has any concerning symptoms in the interval.  The patient voices  understanding of current disease status and treatment options and is in agreement with the current care plan.  All questions were answered. The patient knows to call the clinic with any problems, questions or concerns. We can certainly see the patient much sooner if necessary.   Carlton Adam, PA-C 03/31/2014   ADDENDUM: Hematology/Oncology Attending:  I had a face to face encounter with the patient today. I recommended her care plan. This is a very pleasant 49 years old African-American female with stage II a gastric adenocarcinoma status post resection followed by 2 cycles of adjuvant chemotherapy with 5-FU and leucovorin on weekly basis for the last 2 months. The patient missed a few of her cycles because of pancytopenia. She had repeat CT scan of the abdomen and pelvis performed recently that showed no evidence for disease progression but there was abnormality in the stomach suspicious for gastritis versus local recurrence. I recommended for the patient to see her gastroenterologist Dr. Penelope Coop  for consideration of repeat upper endoscopy and evaluation of this area. I would see the patient back for follow-up visit in 3 weeks for reevaluation and discussion of the course of concurrent chemoradiation with oral Xeloda. She was advised to call immediately if she has any concerning symptoms in the interval.  Disclaimer: This note was dictated with voice recognition software. Similar sounding words can inadvertently be transcribed and may be missed upon review. Eilleen Kempf., MD 03/31/2014

## 2014-03-31 NOTE — Telephone Encounter (Signed)
Confirm appointments. Mailed calendar.

## 2014-03-31 NOTE — Patient Instructions (Signed)
Your restaging CT scan revealed an area that is suspicious for inflammation versus local disease recurrence. We referred you back to Dr. Penelope Coop for another endoscopy You're being referred to Dr. Isidore Moos for radiation therapy Follow-up with Dr. [name] in 3 weeks. We will draw your labs from your Port-A-Cath and will be flushed at that time as well.

## 2014-04-16 ENCOUNTER — Ambulatory Visit: Payer: Self-pay | Admitting: Radiation Oncology

## 2014-04-22 NOTE — Progress Notes (Signed)
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 Belly exam with generalized pain istory of GERD and remote gastric ulcers, admitted on 7/24 with progressive nausea, abdominal pain, bilateral chest wall pain, and migraine headaches, complicated with non-bloody, bilious intractable vomiting on the day of presentation.  09/20/13 Diagnosis 1. 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 - TUMOR INVADES LAMINA PROPRIA. - RESECTION MARGINS NEGATIVE FOR TUMOR. - METASTATIC CARCINOMA IN THREE OF FIVE LYMPH NODES (3/5). - CHRONIC ACTIVE GASTRITIS WITH HELICOBACTER PYLORI. - FRAGMENT OF BENIGN PANCREATIC TISSUE. 2. Lymph node, biopsy, peri-gastric - METASTATIC CARCINOMA IN ONE OF SIX LYMPH NODES (1/6). - LIPOGRANULOMATOUS LYMPHADENOPATHY. 3. Omentum, resection for tumor - FAT NECROSIS.  - NO MALIGNANCY IDENTIFIED.  Past/Anticipated interventions by surgeon, if any: Diagnostic laparoscopy, distal gastrectomy with billroth II anastamosis and feeding gastrojejunostomy tube, [name], MD, 09/20/13.   Past/Anticipated interventions by medical oncology, if any: Dr. [name]:  status post resection followed by 2 cycles of adjuvant chemotherapy with 5-FU and leucovorin on weekly basis for the last 2 months. The patient missed a few of her cycles because of pancytopenia. She had repeat CT scan of the abdomen and pelvis performed recently that showed no evidence for disease progression but there was abnormality in the stomach suspicious for gastritis versus local recurrence. I recommended for the patient to see her gastroenterologist Dr. Penelope Coop for consideration of repeat upper endoscopy and evaluation of this area. I would see the patient back for follow-up visit in 3 weeks for reevaluation and discussion of the  course of concurrent chemoradiation with oral Xeloda. Appt with Dr [name] on 04/23/14. Pt states he is referring her back to GI for endoscopy. Also referring her to dentist.   Weight changes, if any: stable, gained 8 lbs since Nov 2015  Bowel/Bladder complaints, if any: no  Nausea / Vomiting, if any: no  Pain issues, if any: Abdominal pain, denies today. She is not wearing Fentanyl patch, not taking any pain medications.  SAFETY ISSUES:  Prior radiation? NO  Pacemaker/ICD? NO  Possible current pregnancy? NO  patient on methotrexate? NO  Current Complaints / other details: She has a family history of malignancy in several members including an uncle and a niece with stomach cancer. She states her taste buds are back to normal, and her appetite is improving. She drinks Ensure occasionally, eating regular foods.

## 2014-04-23 ENCOUNTER — Telehealth: Payer: Self-pay | Admitting: *Deleted

## 2014-04-23 ENCOUNTER — Telehealth: Payer: Self-pay | Admitting: Internal Medicine

## 2014-04-23 ENCOUNTER — Ambulatory Visit
Admission: RE | Admit: 2014-04-23 | Discharge: 2014-04-23 | Disposition: A | Discharge status: Home / Self Care | Payer: Medicaid Other | Source: Ambulatory Visit | Attending: Radiation Oncology | Admitting: Radiation Oncology

## 2014-04-23 ENCOUNTER — Encounter: Payer: Self-pay | Admitting: Medical Oncology

## 2014-04-23 ENCOUNTER — Other Ambulatory Visit (HOSPITAL_BASED_OUTPATIENT_CLINIC_OR_DEPARTMENT_OTHER): Payer: Medicaid Other

## 2014-04-23 ENCOUNTER — Encounter: Payer: Self-pay | Admitting: Internal Medicine

## 2014-04-23 ENCOUNTER — Ambulatory Visit (HOSPITAL_BASED_OUTPATIENT_CLINIC_OR_DEPARTMENT_OTHER): Payer: Medicaid Other | Admitting: Internal Medicine

## 2014-04-23 ENCOUNTER — Ambulatory Visit: Payer: No Typology Code available for payment source

## 2014-04-23 ENCOUNTER — Ambulatory Visit: Payer: Medicaid Other

## 2014-04-23 ENCOUNTER — Encounter: Payer: Self-pay | Admitting: Radiation Oncology

## 2014-04-23 ENCOUNTER — Telehealth: Payer: Self-pay | Admitting: Medical Oncology

## 2014-04-23 VITALS — BP 118/66 | HR 58 | Temp 97.9°F | Resp 18 | Ht 62.0 in | Wt 130.3 lb

## 2014-04-23 VITALS — BP 118/66 | HR 58 | Temp 98.7°F | Resp 18 | Ht 62.0 in | Wt 129.2 lb

## 2014-04-23 DIAGNOSIS — Z95828 Presence of other vascular implants and grafts: Secondary | ICD-10-CM

## 2014-04-23 DIAGNOSIS — C169 Malignant neoplasm of stomach, unspecified: Secondary | ICD-10-CM

## 2014-04-23 DIAGNOSIS — C163 Malignant neoplasm of pyloric antrum: Secondary | ICD-10-CM | POA: Insufficient documentation

## 2014-04-23 DIAGNOSIS — C779 Secondary and unspecified malignant neoplasm of lymph node, unspecified: Secondary | ICD-10-CM

## 2014-04-23 DIAGNOSIS — R52 Pain, unspecified: Secondary | ICD-10-CM

## 2014-04-23 LAB — CBC WITH DIFFERENTIAL/PLATELET
BASO%: 1 % (ref 0.0–2.0)
Basophils Absolute: 0 10*3/uL (ref 0.0–0.1)
EOS ABS: 0.1 10*3/uL (ref 0.0–0.5)
EOS%: 3.1 % (ref 0.0–7.0)
HEMATOCRIT: 32.8 % — AB (ref 34.8–46.6)
HGB: 10.2 g/dL — ABNORMAL LOW (ref 11.6–15.9)
LYMPH%: 39.7 % (ref 14.0–49.7)
MCH: 29.3 pg (ref 25.1–34.0)
MCHC: 31 g/dL — ABNORMAL LOW (ref 31.5–36.0)
MCV: 94.4 fL (ref 79.5–101.0)
MONO#: 0.4 10*3/uL (ref 0.1–0.9)
MONO%: 9.5 % (ref 0.0–14.0)
NEUT%: 46.7 % (ref 38.4–76.8)
NEUTROS ABS: 2.1 10*3/uL (ref 1.5–6.5)
PLATELETS: 292 10*3/uL (ref 145–400)
RBC: 3.48 10*6/uL — ABNORMAL LOW (ref 3.70–5.45)
RDW: 15.3 % — ABNORMAL HIGH (ref 11.2–14.5)
WBC: 4.5 10*3/uL (ref 3.9–10.3)
lymph#: 1.8 10*3/uL (ref 0.9–3.3)

## 2014-04-23 LAB — COMPREHENSIVE METABOLIC PANEL (CC13)
ALK PHOS: 74 U/L (ref 40–150)
ALT: 12 U/L (ref 0–55)
AST: 15 U/L (ref 5–34)
Albumin: 3.7 g/dL (ref 3.5–5.0)
Anion Gap: 8 mEq/L (ref 3–11)
BUN: 14.4 mg/dL (ref 7.0–26.0)
CO2: 29 mEq/L (ref 22–29)
Calcium: 9.2 mg/dL (ref 8.4–10.4)
Chloride: 105 mEq/L (ref 98–109)
Creatinine: 1 mg/dL (ref 0.6–1.1)
EGFR: 78 mL/min/{1.73_m2} — AB (ref >90)
Glucose: 86 mg/dl (ref 70–140)
Potassium: 4.4 mEq/L (ref 3.5–5.1)
Sodium: 142 mEq/L (ref 136–145)
Total Bilirubin: 0.61 mg/dL (ref 0.20–1.20)
Total Protein: 6.6 g/dL (ref 6.4–8.3)

## 2014-04-23 MED ORDER — HEPARIN SOD (PORK) LOCK FLUSH 100 UNIT/ML IV SOLN
500.0000 [IU] | Freq: Once | INTRAVENOUS | Status: AC
  Administered 2014-04-23: 500 [IU] via INTRAVENOUS
  Filled 2014-04-23: qty 5

## 2014-04-23 MED ORDER — SODIUM CHLORIDE 0.9 % IJ SOLN
10.0000 mL | INTRAMUSCULAR | Status: DC | PRN
  Administered 2014-04-23: 10 mL via INTRAVENOUS
  Filled 2014-04-23: qty 10

## 2014-04-23 NOTE — Telephone Encounter (Signed)
Letter mailed to pt.  

## 2014-04-23 NOTE — Progress Notes (Signed)
[facility] [phone]) [phone]   Fax:(336) [phone]  OFFICE PROGRESS NOTE  Arnoldo Morale, MD [address] [place] [address]  DIAGNOSIS: Stage IIA (T1a, N2, M0) gastric adenocarcinoma diagnosed in July of 2015  PRIOR [name] post diagnostic laparoscopy, distal gastrectomy with billroth II anastamosis and feeding gastrojejunostomy tube under the care of Dr. Barry Dienes on 09/20/2013.   CURRENT THERAPY: Adjuvant systemic chemotherapy with 5-FU 600 mg/M2 with leucovorin on days 1, 8, 15, 22, 29 and 36 every 8 weeks. First dose 11/19/2013.  INTERVAL HISTORY: Paula [name] 49 y.o. female returns to the clinic today for hospital followup visit. She completed the first 2 cycles of adjuvant chemotherapy with 5-FU and leucovorin status post 6 weeks of treatment and tolerating her treatment fairly well. The patient was found on previous CT scan of the abdomen and pelvis to have thickening in the stomach. She was referred to Dr. Penelope Coop but was unable to see him yet because of the delay in the appointment. She has been doing fine and recently with no specific complaints. She denied having any significant fever or chills, no nausea or vomiting. She denied having any significant shortness of breath or hemoptysis. She has no significant weight loss or night sweats. She is expected to see Dr. Isidore Moos later today for discussion of the course of concurrent chemoradiation.  MEDICAL HISTORY: Past Medical History  Diagnosis Date  . Headache(784.0) 2011    Migraines  . Esophageal reflux     On omeprazole  . Costochondritis   . Hypertension     started around 2011  . Ovarian cyst   . Stomach cancer   . Family history of cancer   . Heart murmur   . On antineoplastic chemotherapy     5-FU and leucovorin    ALLERGIES:  has No Known Allergies.  MEDICATIONS:  Current Outpatient Prescriptions  Medication Sig Dispense Refill  . carvedilol (COREG) 6.25 MG  tablet Take 6.25 mg by mouth 2 (two) times daily with a meal.    . lidocaine (LIDODERM) 5 % 1 patch daily as needed.    . lidocaine-prilocaine (EMLA) cream Apply 1 application topically as needed. Apply to port 1 hour before chemo 30 g 0  . LORazepam (ATIVAN) 1 MG tablet Take 1 tablet (1 mg total) by mouth every 8 (eight) hours as needed for anxiety. 30 tablet 0  . metoCLOPramide (REGLAN) 10 MG tablet Take 1 tablet (10 mg total) by mouth 4 (four) times daily. 120 tablet 3  . Multiple Vitamins-Minerals (MULTIVITAMIN & MINERAL PO) Take 1 tablet by mouth daily.    . Nutritional Supplements (ENSURE COMPLETE PO) Take 237 mLs by mouth daily. Takes a little at a time, in order to keep it down.    . pantoprazole (PROTONIX) 40 MG tablet Take 1 tablet (40 mg total) by mouth 2 (two) times daily before a meal. 60 tablet 3  . oxyCODONE (OXY IR/ROXICODONE) 5 MG immediate release tablet Take 1 tablet (5 mg total) by mouth every 4 (four) hours as needed for severe pain. (Patient not taking: Reported on 04/23/2014) 45 tablet 0   No current facility-administered medications for this visit.   Facility-Administered Medications Ordered in Other Visits  Medication Dose Route Frequency Provider Last Rate Last Dose  . sodium chloride 0.9 % injection 10 mL  10 mL Intravenous PRN Curt Bears, MD   10 mL at 04/23/14 0911    SURGICAL HISTORY:  Past Surgical History  Procedure Laterality Date  . Oophorectomy      Around 1985 left ovary removal  . Tonsillectomy      Around 1994  . Tubal ligation    . Esophagogastroduodenoscopy N/A 09/16/2013    Procedure: ESOPHAGOGASTRODUODENOSCOPY (EGD);  Surgeon: Wonda Horner, MD;  Location: [facility];  Service: Endoscopy;  Laterality: N/A;  . Laparoscopy N/A 09/20/2013    Procedure: DIAGNOSTIC LAPAROSCOPY,DISTAL GASTRECTOMY AND FEEDING GASTROJEJUNOSTOMY;  Surgeon: [name], MD;  Location: [facility];  Service: General;  Laterality: N/A;  . Portacath placement Left 11/18/2013     Procedure: INSERTION PORT-A-CATH;  Surgeon: [name], MD;  Location: [facility];  Service: General;  Laterality: Left;    REVIEW OF SYSTEMS:  A comprehensive review of systems was negative except for: Constitutional: positive for fatigue   PHYSICAL EXAMINATION: General appearance: alert, cooperative, fatigued and no distress Head: Normocephalic, without obvious abnormality, atraumatic Neck: no adenopathy, no JVD, supple, symmetrical, trachea midline and thyroid not enlarged, symmetric, no tenderness/mass/nodules Lymph nodes: Cervical, supraclavicular, and axillary nodes normal. Resp: clear to auscultation bilaterally Back: symmetric, no curvature. ROM normal. No CVA tenderness. Cardio: regular rate and rhythm, S1, S2 normal, no murmur, click, rub or gallop GI: soft, non-tender; bowel sounds normal; no masses,  no organomegaly Extremities: extremities normal, atraumatic, no cyanosis or edema Neurologic: Alert and oriented X 3, normal strength and tone. Normal symmetric reflexes. Normal coordination and gait  ECOG PERFORMANCE STATUS: 1 - Symptomatic but completely ambulatory  Blood pressure 118/66, pulse 58, temperature 97.9 F (36.6 C), temperature source Oral, resp. rate 18, height 5\' 2"  (1.575 m), weight 130 lb 4.8 oz (59.104 kg), SpO2 100 %.  LABORATORY DATA: Lab Results  Component Value Date   WBC 4.5 04/23/2014   HGB 10.2* 04/23/2014   HCT 32.8* 04/23/2014   MCV 94.4 04/23/2014   PLT 292 04/23/2014      Chemistry      Component Value Date/Time   NA 139 03/31/2014 1331   NA 137 12/13/2013 2359   K 3.9 03/31/2014 1331   K 3.8 12/13/2013 2359   CL 103 12/13/2013 2359   CO2 24 03/31/2014 1331   CO2 25 12/13/2013 2359   BUN 11.2 03/31/2014 1331   BUN 10 12/13/2013 2359   CREATININE 1.0 03/31/2014 1331   CREATININE 0.91 12/13/2013 2359      Component Value Date/Time   CALCIUM 9.2 03/31/2014 1331   CALCIUM 8.3* 12/13/2013 2359   ALKPHOS 89 03/31/2014 1331   ALKPHOS 109  11/11/2013 1415   AST 17 03/31/2014 1331   AST 27 11/11/2013 1415   ALT 15 03/31/2014 1331   ALT 75* 11/11/2013 1415   BILITOT 0.63 03/31/2014 1331   BILITOT 0.4 11/11/2013 1415       RADIOGRAPHIC STUDIES:  ASSESSMENT AND PLAN: this is a very pleasant 50 years old [place] American female recently diagnosed with a stage IIA gastric adenocarcinoma status post resection with lymph node dissection. She completed the first 2 cycles adjuvant chemotherapy with 5-FU and leucovorin and tolerated her treatment fairly well except for neutropenia and mild nausea. The patient was found on the recent scan to have thickening in the gastric wall and I will refer her back to Dr. Penelope Coop for evaluation and repeat upper endoscopy. She is also scheduled to see Dr. Isidore Moos later today for discussion of the course of concurrent chemoradiation. The patient also complains about some dental issues and pain. I will refer her to Dr. Enrique Sack for evaluation. For pain management,  she will continue on her current pain medication. She would come back for follow-up visit in 2 weeks for reevaluation and discussion of the remaining part of her adjuvant therapy. She was advised to call immediately if she has any concerning symptoms in the interval.  The patient voices understanding of current disease status and treatment options and is in agreement with the current care plan.  All questions were answered. The patient knows to call the clinic with any problems, questions or concerns. We can certainly see the patient much sooner if necessary.  Disclaimer: This note was dictated with voice recognition software. Similar sounding words can inadvertently be transcribed and may not be corrected upon review.

## 2014-04-23 NOTE — Telephone Encounter (Signed)
s.w. pt and advised on 3.10 @ 9:30am Dr. Darrel Hoover and other March appts....pt ok and aware

## 2014-04-23 NOTE — Progress Notes (Signed)
Radiation Oncology         [phone]) [phone] ________________________________  Name: Paula [name] MRN: [id number]  Date: 04/23/2014  DOB: 03/08/1965  Follow-Up Visit Note  Outpatient  CC: Arnoldo Morale, MD  Curt Bears, MD  REFERRING: [facility]  Diagnosis:   Stage IIA pT1a, pN2, pMX poorly differentiated adenocarcinoma with signet rings, diffuse type, of gastric antrum and body  CODE C16.3    ICD-9-CM ICD-10-CM   1. Gastric carcinoma s/p Distal gastrectomy/GJ (Billroth II) 09/20/2013 151.9 C16.9   2. Cancer of antrum of stomach 151.2 C16.3     Narrative:  The patient returns today for routine follow-up.     She completed adjuvant chemotherapy with 5-FU and leucovorin and tolerated her treatment fairly well except for neutropenia and mild nausea, per med/onc notes. She was found on the recent scan to have thickening in the gastric wall and was referred back to Dr. Penelope Coop for evaluation and repeat upper endoscopy. This is taking place next week.  GI tumor board, per this AM's discussion, concurs with this plan. Dr [name] referred her to me for discussion of the course of concurrent chemoradiation.          ALLERGIES:  has No Known Allergies.  Meds: Current Outpatient Prescriptions  Medication Sig Dispense Refill  . carvedilol (COREG) 6.25 MG tablet Take 6.25 mg by mouth 2 (two) times daily with a meal.    . lidocaine (LIDODERM) 5 % 1 patch daily as needed.    . lidocaine-prilocaine (EMLA) cream Apply 1 application topically as needed. Apply to port 1 hour before chemo 30 g 0  . LORazepam (ATIVAN) 1 MG tablet Take 1 tablet (1 mg total) by mouth every 8 (eight) hours as needed for anxiety. 30 tablet 0  . metoCLOPramide (REGLAN) 10 MG tablet Take 1 tablet (10 mg total) by mouth 4 (four) times daily. 120 tablet 3  . Multiple Vitamins-Minerals (MULTIVITAMIN & MINERAL PO) Take 1 tablet by mouth daily.    . Nutritional Supplements (ENSURE COMPLETE PO) Take 237 mLs by mouth  daily. Takes a little at a time, in order to keep it down.    . oxyCODONE (OXY IR/ROXICODONE) 5 MG immediate release tablet Take 1 tablet (5 mg total) by mouth every 4 (four) hours as needed for severe pain. 45 tablet 0  . pantoprazole (PROTONIX) 40 MG tablet Take 1 tablet (40 mg total) by mouth 2 (two) times daily before a meal. 60 tablet 3   No current facility-administered medications for this encounter.    Physical Findings: The patient is in no acute distress. Patient is alert and oriented. .  Wt Readings from Last 3 Encounters:  04/23/14 129 lb 3.2 oz (58.605 kg)  04/23/14 130 lb 4.8 oz (59.104 kg)  03/31/14 122 lb 6.4 oz (55.52 kg)   Temp Readings from Last 3 Encounters:  04/23/14 98.7 F (37.1 C) Oral  04/23/14 97.9 F (36.6 C) Oral  03/31/14 97.9 F (36.6 C) Oral   BP Readings from Last 3 Encounters:  04/23/14 118/66  04/23/14 118/66  03/31/14 123/77   Pulse Readings from Last 3 Encounters:  04/23/14 58  04/23/14 58  03/31/14 74     General: Alert and oriented, in no acute distress tongue Neck: Neck is supple, no palpable cervical or supraclavicular lymphadenopathy. Heart: Regular in rate and rhythm with no murmurs, rubs, or gallops. Chest: Clear to auscultation bilaterally, with no rhonchi, wheezes, or rales. Abdomen: Soft, non tender, nondistended, with no rigidity or  guarding. Extremities: No  Edema. Fingernail changes r/t chemotherapy     Lab Findings: Lab Results  Component Value Date   WBC 4.5 04/23/2014   HGB 10.2* 04/23/2014   HCT 32.8* 04/23/2014   MCV 94.4 04/23/2014   PLT 292 04/23/2014    Radiographic Findings: CT A/P on 03-21-14  1. Status post gastrojejunostomy, with removal of gastrostomy tube. Underdistention of the gastric remnant with apparent wall thickening. Cannot exclude either gastritis or locally recurrent disease. If recent endoscopy has not been performed, this should be considered. 2. No adenopathy to suggest nodal  metastasis. A subtle nodule within the right abdominal omentum has been present back to 09/13/2013 and is favored to be benign. Recommend attention on follow-up. 3. The liver lesion detailed on the prior exam is not identified today. This may have represented an artifact or area of focal steatosis. Ireatment of an isolated hepatic metastasis is felt less likely.   Impression/Plan:  It was a pleasure seeing her again today.   Consent for radiotherapy re-reviewed and simulation scheduled for March 14, to allow GI evaluation to be completed beforehand.  I will refer her to SW as she is distressed by burden of 5 weeks of anticipated RT.  Wondering if gas card or other financial support is available.   _____________________________________   Eppie Gibson, MD

## 2014-04-23 NOTE — Telephone Encounter (Signed)
Called office regarding referral placed on 03/31/14 and again today. Needs repeat endo to assess for recurrence of gastric cancer. Treatment decisions await this procedure. Confirmed he is not in her network for tier #1, but she is tier#2 and co pay will be $60.00. She has met all her out of pocket deductible ($1005) for this year. Scheduled for 05/01/14 at 0930. Requested she be put on call list if someone cancels and they will do so. Notified HIM to fax records to [phone]. Notified Luba of her appointment on 05/01/14 at 0930 and that she is on the call list. 

## 2014-04-23 NOTE — Telephone Encounter (Signed)
Faxed pt medical records to Dr. Penelope Coop [phone]

## 2014-04-23 NOTE — Progress Notes (Signed)
Please see the Nurse Progress Note in the MD Initial Consult Encounter for this patient. 

## 2014-04-24 ENCOUNTER — Encounter: Payer: Self-pay | Admitting: Radiation Oncology

## 2014-04-24 NOTE — Progress Notes (Signed)
Patient to ask if letter had been mailed yet.  Informed her that it was mailed yesterday, April 23, 2014.

## 2014-04-24 NOTE — Progress Notes (Signed)
New auth for IMRT started. [id number] w/[employer] @ [phone]. Case in review and faxing clinicals to [phone]. Emailed Dr. Isidore Moos. (lw)

## 2014-04-28 ENCOUNTER — Telehealth: Payer: Self-pay | Admitting: Medical Oncology

## 2014-04-28 ENCOUNTER — Telehealth: Payer: Self-pay | Admitting: *Deleted

## 2014-04-28 NOTE — Telephone Encounter (Signed)
Letter mailed  to pt last week -Pt notified.

## 2014-04-28 NOTE — Telephone Encounter (Signed)
Has not received letter . I told her it was mailed last week. Another copy put up front for her to pick up

## 2014-04-28 NOTE — Telephone Encounter (Signed)
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 Can I come by today and pick up the letter?" Return number is [phone]. This message will be routed to Abelina Bachelor, RN.

## 2014-04-30 ENCOUNTER — Other Ambulatory Visit: Payer: Self-pay | Admitting: *Deleted

## 2014-04-30 DIAGNOSIS — C169 Malignant neoplasm of stomach, unspecified: Secondary | ICD-10-CM

## 2014-04-30 MED ORDER — LORAZEPAM 1 MG PO TABS: 1.0000 mg | ORAL_TABLET | Freq: Three times a day (TID) | ORAL | Status: DC | PRN

## 2014-05-02 ENCOUNTER — Telehealth: Payer: Self-pay | Admitting: *Deleted

## 2014-05-02 NOTE — Telephone Encounter (Signed)
Called to confirm Paula [name] saw Dr. Penelope Coop on 05/01/14. Office note is not available yet, however she is having an upper endoscopy on 05/07/14. Dr. [name] notified.

## 2014-05-05 ENCOUNTER — Ambulatory Visit
Admission: RE | Admit: 2014-05-05 | Discharge: 2014-05-05 | Disposition: A | Discharge status: Home / Self Care | Payer: Medicaid Other | Source: Ambulatory Visit | Attending: Radiation Oncology | Admitting: Radiation Oncology

## 2014-05-05 VITALS — BP 109/70 | HR 77 | Temp 98.2°F | Resp 12 | Wt 132.5 lb

## 2014-05-05 DIAGNOSIS — C163 Malignant neoplasm of pyloric antrum: Secondary | ICD-10-CM

## 2014-05-05 MED ORDER — SODIUM CHLORIDE 0.9 % IJ SOLN
10.0000 mL | INTRAMUSCULAR | Status: DC | PRN
  Administered 2014-05-05: 10 mL via INTRAVENOUS

## 2014-05-05 NOTE — Progress Notes (Signed)
D/c LAC  Catheter,tip intact, placed 2 2x2 gause, over site and taped securely, patient advised to keep on till this afternoon later, and to reapply when taking off if bleeding reoccurs,patient gave verbal understanding

## 2014-05-05 NOTE — Progress Notes (Addendum)
  Radiation Oncology         [phone]) [phone] ________________________________  Name: Paula [name] MRN: [id number]  Date: 05/05/2014 DOB: February 11, 1966  SIMULATION AND TREATMENT PLANNING NOTE and SPECIAL TREATMENT PROCEDURE  Outpatient  DIAGNOSIS:  151.2 (ICD-9) and C16.3 (ICD-10) Carcinoma of the antrum of the stomach  NARRATIVE:  The patient was brought to the [facility].  Identity was confirmed.  All relevant records and images related to the planned course of therapy were reviewed.  The patient freely provided informed written consent to proceed with treatment after reviewing the details related to the planned course of therapy. The consent form was witnessed and verified by the simulation staff.    Then, the patient was set-up in a stable reproducible  supine position for radiation therapy.  CT images were obtained.  Surface markings were placed.  The CT images were loaded into the planning software.    TREATMENT PLANNING NOTE: Treatment planning then occurred.  The radiation prescription was entered and confirmed.    A total of 5 medically necessary complex treatment devices were fabricated and supervised by me (4 fields with MLCs to block kidneys, liver, cord, heart, and a vaclock for her legs to immobilize). I have requested : 3D Simulation  I have requested a DVH of the following structures: kidneys, liver, cord, heart.    IMRT may be needed to spare her kidneys, but we are attempting a 3D plan first.  The patient will receive 45 Gy in 25 fractions to the gastric region and regional nodes.  Special Treatment Procedure Note: The patient will be receiving chemotherapy concurrently. Chemotherapy heightens the risk of side effects. I have considered this during the patient's treatment planning process and will monitor the patient accordingly for side effects on a weekly basis. Concurrent chemotherapy increases the complexity of this patient's treatment and therefore  this constitutes a special treatment procedure.  -----------------------------------  Eppie Gibson, MD

## 2014-05-05 NOTE — Progress Notes (Signed)
Started 22 gauge IV in left AC.  Blood return noted. Flushed with 10 cc normal saline. Secured with tegaderm and tape. Patient tolerated well.

## 2014-05-07 ENCOUNTER — Other Ambulatory Visit: Payer: Self-pay

## 2014-05-07 ENCOUNTER — Ambulatory Visit: Payer: Self-pay | Admitting: Nurse Practitioner

## 2014-05-07 ENCOUNTER — Other Ambulatory Visit: Payer: Self-pay | Admitting: Gastroenterology

## 2014-05-07 DIAGNOSIS — C163 Malignant neoplasm of pyloric antrum: Secondary | ICD-10-CM | POA: Diagnosis not present

## 2014-05-07 NOTE — Addendum Note (Signed)
Encounter addended by: Eppie Gibson, MD on: 05/07/2014  6:57 PM<BR>     Documentation filed: Notes Section

## 2014-05-08 ENCOUNTER — Telehealth: Payer: Self-pay | Admitting: Medical Oncology

## 2014-05-08 DIAGNOSIS — C169 Malignant neoplasm of stomach, unspecified: Secondary | ICD-10-CM

## 2014-05-08 NOTE — Telephone Encounter (Signed)
Pt could not come to appt yesterday due to procedure and unable to drive. Onc tx request sent.

## 2014-05-09 ENCOUNTER — Telehealth: Payer: Self-pay | Admitting: Internal Medicine

## 2014-05-09 DIAGNOSIS — C163 Malignant neoplasm of pyloric antrum: Secondary | ICD-10-CM | POA: Diagnosis not present

## 2014-05-09 NOTE — Telephone Encounter (Signed)
spoke with patient and advised on 3.21 appt.Marland KitchenMarland KitchenMarland KitchenMarland Kitchenpatient ok and awaer

## 2014-05-12 ENCOUNTER — Other Ambulatory Visit (HOSPITAL_BASED_OUTPATIENT_CLINIC_OR_DEPARTMENT_OTHER): Payer: Medicaid Other

## 2014-05-12 ENCOUNTER — Ambulatory Visit
Admission: RE | Admit: 2014-05-12 | Discharge: 2014-05-12 | Disposition: A | Discharge status: Home / Self Care | Payer: No Typology Code available for payment source | Source: Ambulatory Visit | Attending: Radiation Oncology | Admitting: Radiation Oncology

## 2014-05-12 ENCOUNTER — Ambulatory Visit (HOSPITAL_BASED_OUTPATIENT_CLINIC_OR_DEPARTMENT_OTHER): Payer: Medicaid Other | Admitting: Internal Medicine

## 2014-05-12 ENCOUNTER — Encounter: Payer: Self-pay | Admitting: Radiation Oncology

## 2014-05-12 ENCOUNTER — Telehealth: Payer: Self-pay | Admitting: Internal Medicine

## 2014-05-12 ENCOUNTER — Ambulatory Visit
Admission: RE | Admit: 2014-05-12 | Discharge: 2014-05-12 | Disposition: A | Discharge status: Home / Self Care | Payer: Medicaid Other | Source: Ambulatory Visit | Attending: Radiation Oncology | Admitting: Radiation Oncology

## 2014-05-12 ENCOUNTER — Encounter: Payer: Self-pay | Admitting: Internal Medicine

## 2014-05-12 VITALS — BP 109/85 | HR 66 | Temp 98.4°F | Resp 18 | Ht 62.0 in | Wt 132.8 lb

## 2014-05-12 DIAGNOSIS — Z510 Encounter for antineoplastic radiation therapy: Secondary | ICD-10-CM | POA: Diagnosis not present

## 2014-05-12 DIAGNOSIS — C169 Malignant neoplasm of stomach, unspecified: Secondary | ICD-10-CM | POA: Diagnosis present

## 2014-05-12 DIAGNOSIS — C779 Secondary and unspecified malignant neoplasm of lymph node, unspecified: Secondary | ICD-10-CM

## 2014-05-12 DIAGNOSIS — R52 Pain, unspecified: Secondary | ICD-10-CM

## 2014-05-12 DIAGNOSIS — K088 Other specified disorders of teeth and supporting structures: Secondary | ICD-10-CM | POA: Diagnosis not present

## 2014-05-12 DIAGNOSIS — C163 Malignant neoplasm of pyloric antrum: Secondary | ICD-10-CM | POA: Diagnosis not present

## 2014-05-12 LAB — COMPREHENSIVE METABOLIC PANEL (CC13)
ALK PHOS: 72 U/L (ref 40–150)
ALT: 16 U/L (ref 0–55)
AST: 19 U/L (ref 5–34)
Albumin: 3.8 g/dL (ref 3.5–5.0)
Anion Gap: 10 mEq/L (ref 3–11)
BUN: 11.7 mg/dL (ref 7.0–26.0)
CALCIUM: 9 mg/dL (ref 8.4–10.4)
CO2: 26 mEq/L (ref 22–29)
Chloride: 105 mEq/L (ref 98–109)
Creatinine: 1 mg/dL (ref 0.6–1.1)
EGFR: 81 mL/min/{1.73_m2} — AB (ref >90)
GLUCOSE: 101 mg/dL (ref 70–140)
POTASSIUM: 3.9 meq/L (ref 3.5–5.1)
Sodium: 141 mEq/L (ref 136–145)
TOTAL PROTEIN: 6.9 g/dL (ref 6.4–8.3)
Total Bilirubin: 0.73 mg/dL (ref 0.20–1.20)

## 2014-05-12 LAB — CBC WITH DIFFERENTIAL/PLATELET
BASO%: 0.9 % (ref 0.0–2.0)
Basophils Absolute: 0 10*3/uL (ref 0.0–0.1)
EOS%: 2.8 % (ref 0.0–7.0)
Eosinophils Absolute: 0.1 10*3/uL (ref 0.0–0.5)
HCT: 34.5 % — ABNORMAL LOW (ref 34.8–46.6)
HGB: 10.7 g/dL — ABNORMAL LOW (ref 11.6–15.9)
LYMPH#: 1.5 10*3/uL (ref 0.9–3.3)
LYMPH%: 40.9 % (ref 14.0–49.7)
MCH: 28.9 pg (ref 25.1–34.0)
MCHC: 31.1 g/dL — AB (ref 31.5–36.0)
MCV: 93 fL (ref 79.5–101.0)
MONO#: 0.3 10*3/uL (ref 0.1–0.9)
MONO%: 7.7 % (ref 0.0–14.0)
NEUT#: 1.8 10*3/uL (ref 1.5–6.5)
NEUT%: 47.7 % (ref 38.4–76.8)
Platelets: 266 10*3/uL (ref 145–400)
RBC: 3.71 10*6/uL (ref 3.70–5.45)
RDW: 13.9 % (ref 11.2–14.5)
WBC: 3.7 10*3/uL — ABNORMAL LOW (ref 3.9–10.3)

## 2014-05-12 LAB — CEA: CEA: 1.3 ng/mL (ref 0.0–5.0)

## 2014-05-12 MED ORDER — RADIAPLEXRX EX GEL
Freq: Once | CUTANEOUS | Status: AC
  Administered 2014-05-12: 12:00:00 via TOPICAL

## 2014-05-12 MED ORDER — CAPECITABINE 500 MG PO TABS: 625.0000 mg/m2 | ORAL_TABLET | Freq: Two times a day (BID) | ORAL | Status: DC

## 2014-05-12 NOTE — Progress Notes (Addendum)
Patient denies pain, nausea, loss of appetite. She is fatigued from chemo. She will pick up Xeloda today to begin taking. Patient education completed with patient today. Gave her "Radiation Therapy and You" booklet with all pertinent pages marked and discussed, re: diarrhea, fatigue, nausea/vomiting, skin irritation/care, nutrition, pain. Gave pt Radiaplex with instructions for proper use. Teach back method used. Pt verbalized understanding.  BP 131/72 mmHg  Pulse 63  Temp(Src) 97.7 F (36.5 C) (Oral)  Resp 18  Wt 133 lb 1.6 oz (60.374 kg)

## 2014-05-12 NOTE — Progress Notes (Signed)
[place] [phone]   [phone]  OFFICE PROGRESS NOTE  Arnoldo Morale, MD [place] [address]  DIAGNOSIS: Stage IIA (T1a, N2, M0) gastric adenocarcinoma diagnosed in July of 2015  PRIOR THERAPY: 1) Status post diagnostic laparoscopy, distal gastrectomy with billroth II anastamosis and feeding gastrojejunostomy tube under the care of Dr. Barry Dienes on 09/20/2013. 2) Adjuvant systemic chemotherapy with 5-FU 600 mg/M2 with leucovorin on days 1, 8, 15, 22, 29 and 36 every 8 weeks. First dose 11/19/2013.   CURRENT THERAPY: Adjuvant concurrent chemoradiation with Xeloda 650 MG/M2 by mouth twice a day for 5 days every week with radiation. She is expected to start the first dose of this treatment today.  INTERVAL HISTORY: Paula [name] 49 y.o. female returns to the clinic today for followup visit. She completed the first 2 cycles of adjuvant chemotherapy with 5-FU and leucovorin status post 6 weeks of treatment and tolerating her treatment fairly well.  She was found on previous CT scan of the abdomen to have gastric wall thickening. The patient was referred to Dr. Penelope Coop and underwent upper endoscopy with biopsy that showed no evidence for disease recurrence. She has been doing fine and recently with no specific complaints. She denied having any significant fever or chills, no nausea or vomiting. She denied having any significant shortness of breath or hemoptysis. She has no significant weight loss or night sweats. She is here today to start the first dose of concurrent chemoradiation.  MEDICAL HISTORY: Past Medical History  Diagnosis Date  . Headache(784.0) 2011    Migraines  . Esophageal reflux     On omeprazole  . Costochondritis   . Hypertension     started around 2011  . Ovarian cyst   . Stomach cancer   . Family history of cancer   . Heart murmur   . On antineoplastic chemotherapy     5-FU and leucovorin     ALLERGIES:  has No Known Allergies.  MEDICATIONS:  Current Outpatient Prescriptions  Medication Sig Dispense Refill  . capecitabine (XELODA) 500 MG tablet Take 2 tablets (1,000 mg total) by mouth 2 (two) times daily after a meal. 120 tablet 1  . carvedilol (COREG) 6.25 MG tablet Take 6.25 mg by mouth 2 (two) times daily with a meal.    . lidocaine (LIDODERM) 5 % 1 patch daily as needed.    . lidocaine-prilocaine (EMLA) cream Apply 1 application topically as needed. Apply to port 1 hour before chemo 30 g 0  . LORazepam (ATIVAN) 1 MG tablet Take 1 tablet (1 mg total) by mouth every 8 (eight) hours as needed for anxiety. 30 tablet 0  . metoCLOPramide (REGLAN) 10 MG tablet Take 1 tablet (10 mg total) by mouth 4 (four) times daily. 120 tablet 3  . Multiple Vitamins-Minerals (MULTIVITAMIN & MINERAL PO) Take 1 tablet by mouth daily.    . Nutritional Supplements (ENSURE COMPLETE PO) Take 237 mLs by mouth daily. Takes a little at a time, in order to keep it down.    . oxyCODONE (OXY IR/ROXICODONE) 5 MG immediate release tablet Take 1 tablet (5 mg total) by mouth every 4 (four) hours as needed for severe pain. 45 tablet 0  . pantoprazole (PROTONIX) 40 MG tablet Take 1 tablet (40 mg total) by mouth 2 (two) times daily before a meal. 60 tablet 3   No current facility-administered medications for this visit.    SURGICAL HISTORY:  Past Surgical  History  Procedure Laterality Date  . Oophorectomy      Around 1985 left ovary removal  . Tonsillectomy      Around 1994  . Tubal ligation    . Esophagogastroduodenoscopy N/A 09/16/2013    Procedure: ESOPHAGOGASTRODUODENOSCOPY (EGD);  Surgeon: Wonda Horner, MD;  Location: [facility];  Service: Endoscopy;  Laterality: N/A;  . Laparoscopy N/A 09/20/2013    Procedure: DIAGNOSTIC LAPAROSCOPY,DISTAL GASTRECTOMY AND FEEDING GASTROJEJUNOSTOMY;  Surgeon: [name], MD;  Location: [facility];  Service: General;  Laterality: N/A;  . Portacath placement Left  11/18/2013    Procedure: INSERTION PORT-A-CATH;  Surgeon: [name], MD;  Location: [facility];  Service: General;  Laterality: Left;    REVIEW OF SYSTEMS:  Constitutional: positive for fatigue Eyes: negative Ears, nose, mouth, throat, and face: negative Respiratory: negative Cardiovascular: negative Gastrointestinal: negative Genitourinary:negative Integument/breast: negative Hematologic/lymphatic: negative Musculoskeletal:negative Neurological: negative Behavioral/Psych: negative Endocrine: negative Allergic/Immunologic: negative   PHYSICAL EXAMINATION: General appearance: alert, cooperative, fatigued and no distress Head: Normocephalic, without obvious abnormality, atraumatic Neck: no adenopathy, no JVD, supple, symmetrical, trachea midline and thyroid not enlarged, symmetric, no tenderness/mass/nodules Lymph nodes: Cervical, supraclavicular, and axillary nodes normal. Resp: clear to auscultation bilaterally Back: symmetric, no curvature. ROM normal. No CVA tenderness. Cardio: regular rate and rhythm, S1, S2 normal, no murmur, click, rub or gallop GI: soft, non-tender; bowel sounds normal; no masses,  no organomegaly Extremities: extremities normal, atraumatic, no cyanosis or edema Neurologic: Alert and oriented X 3, normal strength and tone. Normal symmetric reflexes. Normal coordination and gait  ECOG PERFORMANCE STATUS: 1 - Symptomatic but completely ambulatory  Blood pressure 109/85, pulse 66, temperature 98.4 F (36.9 C), temperature source Oral, resp. rate 18, height 5\' 2"  (1.575 m), weight 132 lb 12.8 oz (60.238 kg), SpO2 100 %.  LABORATORY DATA: Lab Results  Component Value Date   WBC 3.7* 05/12/2014   HGB 10.7* 05/12/2014   HCT 34.5* 05/12/2014   MCV 93.0 05/12/2014   PLT 266 05/12/2014      Chemistry      Component Value Date/Time   NA 141 05/12/2014 0849   NA 137 12/13/2013 2359   K 3.9 05/12/2014 0849   K 3.8 12/13/2013 2359   CL 103 12/13/2013 2359    CO2 26 05/12/2014 0849   CO2 25 12/13/2013 2359   BUN 11.7 05/12/2014 0849   BUN 10 12/13/2013 2359   CREATININE 1.0 05/12/2014 0849   CREATININE 0.91 12/13/2013 2359      Component Value Date/Time   CALCIUM 9.0 05/12/2014 0849   CALCIUM 8.3* 12/13/2013 2359   ALKPHOS 72 05/12/2014 0849   ALKPHOS 109 11/11/2013 1415   AST 19 05/12/2014 0849   AST 27 11/11/2013 1415   ALT 16 05/12/2014 0849   ALT 75* 11/11/2013 1415   BILITOT 0.73 05/12/2014 0849   BILITOT 0.4 11/11/2013 1415       RADIOGRAPHIC STUDIES:  ASSESSMENT AND PLAN: this is a very pleasant 49 years old [place] female recently diagnosed with a stage IIA gastric adenocarcinoma status post resection with lymph node dissection. She completed the first 2 cycles adjuvant chemotherapy with 5-FU and leucovorin and tolerated her treatment fairly well except for neutropenia and mild nausea. The patient was found on the recent scan to have thickening in the gastric wall and repeat upper endoscopy and biopsy showed no evidence for disease recurrence.  I recommended for the patient to proceed with a course of concurrent chemoradiation with Xeloda 650 MG/M2 twice a day  5 days a week during her radiation. The patient also complains about some dental issues and pain. The patient was referred to Dr. Enrique Sack for evaluation. For pain management, she will continue on her current pain medication. She would come back for follow-up visit in 2 weeks for reevaluation and discussion of the remaining part of her adjuvant therapy. She was advised to call immediately if she has any concerning symptoms in the interval.  The patient voices understanding of current disease status and treatment options and is in agreement with the current care plan.  All questions were answered. The patient knows to call the clinic with any problems, questions or concerns. We can certainly see the patient much sooner if necessary.  Disclaimer: This note was  dictated with voice recognition software. Similar sounding words can inadvertently be transcribed and may not be corrected upon review.

## 2014-05-12 NOTE — Progress Notes (Signed)
   Weekly Management Note:  Outpatient    ICD-9-CM ICD-10-CM   1. Gastric carcinoma s/p Distal gastrectomy/GJ (Billroth II) 09/20/2013 151.9 C16.9 hyaluronate sodium (RADIAPLEXRX) gel    Current Dose:  1.8 Gy  Projected Dose: 45 Gy   Narrative:  The patient presents for routine under treatment assessment.  CBCT/MVCT images/Port film x-rays were reviewed.  The chart was checked. No complaints  Physical Findings:  Wt Readings from Last 3 Encounters:  05/12/14 133 lb 1.6 oz (60.374 kg)  05/12/14 132 lb 12.8 oz (60.238 kg)  04/23/14 129 lb 3.2 oz (58.605 kg)    weight is 133 lb 1.6 oz (60.374 kg). Her oral temperature is 97.7 F (36.5 C). Her blood pressure is 131/72 and her pulse is 63. Her respiration is 18.  NAD, ambulatory  CBC    Component Value Date/Time   WBC 3.7* 05/12/2014 0849   WBC 1.9* 12/13/2013 2304   RBC 3.71 05/12/2014 0849   RBC 4.17 12/13/2013 2304   HGB 10.7* 05/12/2014 0849   HGB 10.7* 12/13/2013 2304   HCT 34.5* 05/12/2014 0849   HCT 31.9* 12/13/2013 2304   PLT 266 05/12/2014 0849   PLT 109* 12/13/2013 2304   MCV 93.0 05/12/2014 0849   MCV 76.5* 12/13/2013 2304   MCH 28.9 05/12/2014 0849   MCH 25.7* 12/13/2013 2304   MCHC 31.1* 05/12/2014 0849   MCHC 33.5 12/13/2013 2304   RDW 13.9 05/12/2014 0849   RDW 16.4* 12/13/2013 2304   LYMPHSABS 1.5 05/12/2014 0849   LYMPHSABS 0.9 12/12/2013 1522   MONOABS 0.3 05/12/2014 0849   MONOABS 0.2 12/12/2013 1522   EOSABS 0.1 05/12/2014 0849   EOSABS 0.1 12/12/2013 1522   BASOSABS 0.0 05/12/2014 0849   BASOSABS 0.0 12/12/2013 1522     CMP     Component Value Date/Time   NA 141 05/12/2014 0849   NA 137 12/13/2013 2359   K 3.9 05/12/2014 0849   K 3.8 12/13/2013 2359   CL 103 12/13/2013 2359   CO2 26 05/12/2014 0849   CO2 25 12/13/2013 2359   GLUCOSE 101 05/12/2014 0849   GLUCOSE 161* 12/13/2013 2359   BUN 11.7 05/12/2014 0849   BUN 10 12/13/2013 2359   CREATININE 1.0 05/12/2014 0849   CREATININE 0.91  12/13/2013 2359   CALCIUM 9.0 05/12/2014 0849   CALCIUM 8.3* 12/13/2013 2359   PROT 6.9 05/12/2014 0849   PROT 7.2 11/11/2013 1415   ALBUMIN 3.8 05/12/2014 0849   ALBUMIN 3.5 11/11/2013 1415   AST 19 05/12/2014 0849   AST 27 11/11/2013 1415   ALT 16 05/12/2014 0849   ALT 75* 11/11/2013 1415   ALKPHOS 72 05/12/2014 0849   ALKPHOS 109 11/11/2013 1415   BILITOT 0.73 05/12/2014 0849   BILITOT 0.4 11/11/2013 1415   GFRNONAA 74* 12/13/2013 2359   GFRAA 86* 12/13/2013 2359     Impression:  The patient is tolerating radiotherapy.   Plan:  Continue radiotherapy as planned. Reminded to be NPO 3 hrs before RT. Pt's Xeloda is ready for pick up. If she has $ issues, knows to call med/onc asap.  -----------------------------------  Eppie Gibson, MD

## 2014-05-12 NOTE — Telephone Encounter (Signed)
Gave avs & calendar for April. °

## 2014-05-13 ENCOUNTER — Ambulatory Visit
Admission: RE | Admit: 2014-05-13 | Discharge: 2014-05-13 | Disposition: A | Discharge status: Home / Self Care | Payer: Medicaid Other | Source: Ambulatory Visit | Attending: Radiation Oncology | Admitting: Radiation Oncology

## 2014-05-13 ENCOUNTER — Telehealth: Payer: Self-pay | Admitting: *Deleted

## 2014-05-13 DIAGNOSIS — C163 Malignant neoplasm of pyloric antrum: Secondary | ICD-10-CM | POA: Diagnosis not present

## 2014-05-13 NOTE — Telephone Encounter (Signed)
PT. HAS COVENTRY INSURANCE. SHE IS UNABLE TO PAY THE $200.00 PREMIUM SO PT. CAN NOT OBTAIN HER XELODA. THIS INFORMATION AND COVENTRY'S PHONE #[phone] WAS GIVEN TO R. BROWNING IN MANAGED CARE. LEFT PT. A MESSAGE ON HER HOME VOICE MAIL.

## 2014-05-14 ENCOUNTER — Telehealth: Payer: Self-pay | Admitting: Medical Oncology

## 2014-05-14 ENCOUNTER — Ambulatory Visit
Admission: RE | Admit: 2014-05-14 | Discharge: 2014-05-14 | Disposition: A | Discharge status: Home / Self Care | Payer: Medicaid Other | Source: Ambulatory Visit | Attending: Radiation Oncology | Admitting: Radiation Oncology

## 2014-05-14 ENCOUNTER — Encounter: Payer: Self-pay | Admitting: Internal Medicine

## 2014-05-14 ENCOUNTER — Ambulatory Visit: Payer: Medicaid Other

## 2014-05-14 DIAGNOSIS — C163 Malignant neoplasm of pyloric antrum: Secondary | ICD-10-CM | POA: Diagnosis not present

## 2014-05-14 NOTE — Progress Notes (Signed)
I will let nurse know there is no asst to help pay insurance premium.

## 2014-05-14 NOTE — Telephone Encounter (Signed)
Pt contacted her insurance rep and she has to pay $265 by Friday before they will cover xeloda. . She asked me to see if [facility] outpt will do override for xeloda or if we have any coupons. I contacted Ebony regarding green card .

## 2014-05-15 ENCOUNTER — Telehealth: Payer: Self-pay

## 2014-05-15 ENCOUNTER — Ambulatory Visit
Admission: RE | Admit: 2014-05-15 | Discharge: 2014-05-15 | Disposition: A | Discharge status: Home / Self Care | Payer: Medicaid Other | Source: Ambulatory Visit | Attending: Radiation Oncology | Admitting: Radiation Oncology

## 2014-05-15 ENCOUNTER — Ambulatory Visit: Payer: Medicaid Other

## 2014-05-15 ENCOUNTER — Encounter: Payer: Self-pay | Admitting: Internal Medicine

## 2014-05-15 DIAGNOSIS — C163 Malignant neoplasm of pyloric antrum: Secondary | ICD-10-CM | POA: Diagnosis not present

## 2014-05-15 NOTE — Telephone Encounter (Signed)
Received fax; pt is approved for $350 in [place] funds until insurance goes into effect. Xeloda Rx is being filled and the pharmacy will call pt.

## 2014-05-15 NOTE — Progress Notes (Signed)
[facility] will try and fill Xeloda if she qualifies for 400.00 grant. Sent Lenise an email to see if she does. We can't pay her insurance premium for her. We will try and asst with Xeloda if she qualifies.

## 2014-05-16 ENCOUNTER — Ambulatory Visit
Admission: RE | Admit: 2014-05-16 | Discharge: 2014-05-16 | Disposition: A | Discharge status: Home / Self Care | Payer: Medicaid Other | Source: Ambulatory Visit | Attending: Radiation Oncology | Admitting: Radiation Oncology

## 2014-05-16 ENCOUNTER — Ambulatory Visit: Payer: Medicaid Other

## 2014-05-16 DIAGNOSIS — C163 Malignant neoplasm of pyloric antrum: Secondary | ICD-10-CM | POA: Diagnosis not present

## 2014-05-19 ENCOUNTER — Ambulatory Visit
Admission: RE | Admit: 2014-05-19 | Discharge: 2014-05-19 | Disposition: A | Discharge status: Home / Self Care | Payer: Medicaid Other | Source: Ambulatory Visit | Attending: Radiation Oncology | Admitting: Radiation Oncology

## 2014-05-19 ENCOUNTER — Encounter: Payer: Self-pay | Admitting: Nutrition

## 2014-05-19 ENCOUNTER — Telehealth: Payer: Self-pay | Admitting: *Deleted

## 2014-05-19 ENCOUNTER — Telehealth: Payer: Self-pay | Admitting: Medical Oncology

## 2014-05-19 ENCOUNTER — Ambulatory Visit: Payer: No Typology Code available for payment source | Admitting: Radiation Oncology

## 2014-05-19 ENCOUNTER — Ambulatory Visit: Payer: Medicaid Other

## 2014-05-19 NOTE — Telephone Encounter (Signed)
erroneous

## 2014-05-19 NOTE — Telephone Encounter (Signed)
Left vm re: patient cancelled her radiation treatment this morning. Left this caller's name and direct call back number with request to be called.

## 2014-05-20 ENCOUNTER — Ambulatory Visit
Admission: RE | Admit: 2014-05-20 | Discharge: 2014-05-20 | Disposition: A | Discharge status: Home / Self Care | Payer: No Typology Code available for payment source | Source: Ambulatory Visit | Attending: Radiation Oncology | Admitting: Radiation Oncology

## 2014-05-20 ENCOUNTER — Ambulatory Visit: Payer: Medicaid Other

## 2014-05-20 ENCOUNTER — Ambulatory Visit: Admission: RE | Admit: 2014-05-20 | Payer: Medicaid Other | Source: Ambulatory Visit | Admitting: Radiation Oncology

## 2014-05-20 ENCOUNTER — Encounter: Payer: Self-pay | Admitting: Radiation Oncology

## 2014-05-20 ENCOUNTER — Ambulatory Visit
Admission: RE | Admit: 2014-05-20 | Discharge: 2014-05-20 | Disposition: A | Discharge status: Home / Self Care | Payer: Medicaid Other | Source: Ambulatory Visit | Attending: Radiation Oncology | Admitting: Radiation Oncology

## 2014-05-20 VITALS — BP 113/74 | HR 69 | Temp 98.0°F | Resp 20 | Wt 130.0 lb

## 2014-05-20 DIAGNOSIS — C163 Malignant neoplasm of pyloric antrum: Secondary | ICD-10-CM

## 2014-05-20 NOTE — Progress Notes (Signed)
Weekly Management Note:  Outpatient    ICD-9-CM ICD-10-CM   1. Cancer of antrum of stomach 151.2 C16.3     Current Dose:  10.8 Gy  Projected Dose: 45 Gy   Narrative:  The patient presents for routine under treatment assessment.  CBCT/MVCT images/Port film x-rays were reviewed.  The chart was checked. Patient states she cancelled her treatment yesterday due to attending a funeral for a family member. She states her "stomach is tender". She has had nausea, vomiting but not daily and not this week. She takes Zofran ODT with good relief. She states that following radiation treatments she feels nauseated for 1- 1 1/2 hours.  She denies diarrhea but states she is having multiple soft stools on some days. She has loss of appetite, fatigue.  She lost 3 lbs in past week. She does not drink nutritional supplements but states she "needs to start".  Requested Enid Derry H to schedule patient appointment with nutritionist.  BP 113/74 mmHg  Pulse 69  Temp(Src) 98 F (36.7 C)  Resp 20  Wt 130 lb (58.968 kg)  Physical Findings:  Wt Readings from Last 3 Encounters:  05/12/14 133 lb 1.6 oz (60.374 kg)  05/12/14 132 lb 12.8 oz (60.238 kg)  04/23/14 129 lb 3.2 oz (58.605 kg)    weight is 130 lb (58.968 kg). Her temperature is 98 F (36.7 C). Her blood pressure is 113/74 and her pulse is 69. Her respiration is 20.  NAD, ambulatory, abdomen slightly tender in LUQ, no rigidity or guarding. Skin without change  CBC    Component Value Date/Time   WBC 3.7* 05/12/2014 0849   WBC 1.9* 12/13/2013 2304   RBC 3.71 05/12/2014 0849   RBC 4.17 12/13/2013 2304   HGB 10.7* 05/12/2014 0849   HGB 10.7* 12/13/2013 2304   HCT 34.5* 05/12/2014 0849   HCT 31.9* 12/13/2013 2304   PLT 266 05/12/2014 0849   PLT 109* 12/13/2013 2304   MCV 93.0 05/12/2014 0849   MCV 76.5* 12/13/2013 2304   MCH 28.9 05/12/2014 0849   MCH 25.7* 12/13/2013 2304   MCHC 31.1* 05/12/2014 0849   MCHC 33.5 12/13/2013 2304   RDW 13.9  05/12/2014 0849   RDW 16.4* 12/13/2013 2304   LYMPHSABS 1.5 05/12/2014 0849   LYMPHSABS 0.9 12/12/2013 1522   MONOABS 0.3 05/12/2014 0849   MONOABS 0.2 12/12/2013 1522   EOSABS 0.1 05/12/2014 0849   EOSABS 0.1 12/12/2013 1522   BASOSABS 0.0 05/12/2014 0849   BASOSABS 0.0 12/12/2013 1522     CMP     Component Value Date/Time   NA 141 05/12/2014 0849   NA 137 12/13/2013 2359   K 3.9 05/12/2014 0849   K 3.8 12/13/2013 2359   CL 103 12/13/2013 2359   CO2 26 05/12/2014 0849   CO2 25 12/13/2013 2359   GLUCOSE 101 05/12/2014 0849   GLUCOSE 161* 12/13/2013 2359   BUN 11.7 05/12/2014 0849   BUN 10 12/13/2013 2359   CREATININE 1.0 05/12/2014 0849   CREATININE 0.91 12/13/2013 2359   CALCIUM 9.0 05/12/2014 0849   CALCIUM 8.3* 12/13/2013 2359   PROT 6.9 05/12/2014 0849   PROT 7.2 11/11/2013 1415   ALBUMIN 3.8 05/12/2014 0849   ALBUMIN 3.5 11/11/2013 1415   AST 19 05/12/2014 0849   AST 27 11/11/2013 1415   ALT 16 05/12/2014 0849   ALT 75* 11/11/2013 1415   ALKPHOS 72 05/12/2014 0849   ALKPHOS 109 11/11/2013 1415   BILITOT 0.73 05/12/2014 0849  BILITOT 0.4 11/11/2013 1415   GFRNONAA 74* 12/13/2013 2359   GFRAA 86* 12/13/2013 2359     Impression:  The patient is tolerating radiotherapy.   Plan:  Continue radiotherapy as planned.   Reiterated importance of taking Xeloda (she had some hesitation and questions about it)  Recommend probiotic tablets or capsules to further address loose stool and nausea that have developed since starting RT.  Continue antiemetics, imodium prn   -----------------------------------  Eppie Gibson, MD

## 2014-05-20 NOTE — Progress Notes (Addendum)
Patient states she cancelled her treatment yesterday due to attending a funeral for a family member. She states her "stomach is tender". She has had nausea, vomiting but not daily and not this week. She takes Zofran ODT with good relief. She states that following radiation treatments she feels nauseated for 1- 1 1/2 hours.  She denies diarrhea but states she is having multiple soft stools on some days. She has loss of appetite, fatigue.  She lost 3 lbs in past week. She does not drink nutritional supplements but states she "needs to start".  Requested Enid Derry H to schedule patient appointment with nutritionist.  BP 113/74 mmHg  Pulse 69  Temp(Src) 98 F (36.7 C)  Resp 20  Wt 130 lb (58.968 kg)

## 2014-05-21 ENCOUNTER — Ambulatory Visit
Admission: RE | Admit: 2014-05-21 | Discharge: 2014-05-21 | Disposition: A | Discharge status: Home / Self Care | Payer: Medicaid Other | Source: Ambulatory Visit | Attending: Radiation Oncology | Admitting: Radiation Oncology

## 2014-05-21 ENCOUNTER — Encounter: Payer: Self-pay | Admitting: Internal Medicine

## 2014-05-21 ENCOUNTER — Encounter: Payer: Self-pay | Admitting: *Deleted

## 2014-05-21 ENCOUNTER — Encounter: Payer: Self-pay | Admitting: Radiation Oncology

## 2014-05-21 ENCOUNTER — Ambulatory Visit: Payer: Medicaid Other

## 2014-05-21 DIAGNOSIS — C163 Malignant neoplasm of pyloric antrum: Secondary | ICD-10-CM | POA: Diagnosis not present

## 2014-05-21 NOTE — Progress Notes (Signed)
Placing notes from hospital stay 09/15/14-11/06/14 at front desk(ms wilma) for patient to pick up 05/22/14

## 2014-05-21 NOTE — [facility] Oncology Navigator Note (Signed)
Received Upper GI endo report and pathology from Dr. Laruth Bouchard office. Placed on Dr. Worthy Flank desk.

## 2014-05-22 ENCOUNTER — Ambulatory Visit: Payer: Medicaid Other

## 2014-05-22 ENCOUNTER — Ambulatory Visit
Admission: RE | Admit: 2014-05-22 | Discharge: 2014-05-22 | Disposition: A | Discharge status: Home / Self Care | Payer: Medicaid Other | Source: Ambulatory Visit | Attending: Radiation Oncology | Admitting: Radiation Oncology

## 2014-05-22 DIAGNOSIS — C163 Malignant neoplasm of pyloric antrum: Secondary | ICD-10-CM | POA: Diagnosis not present

## 2014-05-23 ENCOUNTER — Ambulatory Visit
Admission: RE | Admit: 2014-05-23 | Discharge: 2014-05-23 | Disposition: A | Discharge status: Home / Self Care | Payer: Medicaid Other | Source: Ambulatory Visit | Attending: Radiation Oncology | Admitting: Radiation Oncology

## 2014-05-23 ENCOUNTER — Ambulatory Visit: Payer: Medicaid Other

## 2014-05-23 DIAGNOSIS — C163 Malignant neoplasm of pyloric antrum: Secondary | ICD-10-CM | POA: Diagnosis not present

## 2014-05-26 ENCOUNTER — Ambulatory Visit
Admission: RE | Admit: 2014-05-26 | Discharge: 2014-05-26 | Disposition: A | Discharge status: Home / Self Care | Payer: Medicaid Other | Source: Ambulatory Visit | Attending: Radiation Oncology | Admitting: Radiation Oncology

## 2014-05-26 ENCOUNTER — Ambulatory Visit
Admission: RE | Admit: 2014-05-26 | Discharge: 2014-05-26 | Disposition: A | Discharge status: Home / Self Care | Payer: No Typology Code available for payment source | Source: Ambulatory Visit | Attending: Radiation Oncology | Admitting: Radiation Oncology

## 2014-05-26 ENCOUNTER — Ambulatory Visit: Payer: Self-pay | Admitting: Physician Assistant

## 2014-05-26 ENCOUNTER — Ambulatory Visit: Payer: Medicaid Other

## 2014-05-26 VITALS — BP 112/74 | HR 67 | Temp 97.5°F | Resp 20 | Wt 132.2 lb

## 2014-05-26 DIAGNOSIS — C163 Malignant neoplasm of pyloric antrum: Secondary | ICD-10-CM

## 2014-05-26 NOTE — Progress Notes (Addendum)
Patient denies pain, nausea, loss of appetite. She states she did have some nausea over the weekend, takes Zofran prn. She is taking an OTC probiotic daily. She gets fatigued in the evenings.  No weight lost in past week. BP 112/74 mmHg  Pulse 67  Temp(Src) 97.5 F (36.4 C) (Oral)  Resp 20  Wt 132 lb 3.2 oz (59.966 kg)

## 2014-05-26 NOTE — Progress Notes (Signed)
Weekly Management Note:  Outpatient    ICD-9-CM ICD-10-CM   1. Cancer of antrum of stomach 151.2 C16.3     Current Dose:  18 Gy  Projected Dose: 45 Gy   Narrative:  The patient presents for routine under treatment assessment.  CBCT/MVCT images/Port film x-rays were reviewed.  The chart was checked.  Patient denies pain, nausea, loss of appetite. She states she did have some nausea over the weekend, takes Zofran prn. She is taking an OTC probiotic daily. She gets fatigued in the evenings.  No weight lost in past week.  She stopped Xeloda late last week due to N/V. There symptoms are now "better"  BP 112/74 mmHg  Pulse 67  Temp(Src) 97.5 F (36.4 C) (Oral)  Resp 20  Wt 132 lb 3.2 oz (59.966 kg)    Physical Findings:  Wt Readings from Last 3 Encounters:  05/12/14 133 lb 1.6 oz (60.374 kg)  05/12/14 132 lb 12.8 oz (60.238 kg)  04/23/14 129 lb 3.2 oz (58.605 kg)    weight is 132 lb 3.2 oz (59.966 kg). Her oral temperature is 97.5 F (36.4 C). Her blood pressure is 112/74 and her pulse is 67. Her respiration is 20.  NAD, ambulatory  CBC    Component Value Date/Time   WBC 3.7* 05/12/2014 0849   WBC 1.9* 12/13/2013 2304   RBC 3.71 05/12/2014 0849   RBC 4.17 12/13/2013 2304   HGB 10.7* 05/12/2014 0849   HGB 10.7* 12/13/2013 2304   HCT 34.5* 05/12/2014 0849   HCT 31.9* 12/13/2013 2304   PLT 266 05/12/2014 0849   PLT 109* 12/13/2013 2304   MCV 93.0 05/12/2014 0849   MCV 76.5* 12/13/2013 2304   MCH 28.9 05/12/2014 0849   MCH 25.7* 12/13/2013 2304   MCHC 31.1* 05/12/2014 0849   MCHC 33.5 12/13/2013 2304   RDW 13.9 05/12/2014 0849   RDW 16.4* 12/13/2013 2304   LYMPHSABS 1.5 05/12/2014 0849   LYMPHSABS 0.9 12/12/2013 1522   MONOABS 0.3 05/12/2014 0849   MONOABS 0.2 12/12/2013 1522   EOSABS 0.1 05/12/2014 0849   EOSABS 0.1 12/12/2013 1522   BASOSABS 0.0 05/12/2014 0849   BASOSABS 0.0 12/12/2013 1522     CMP     Component Value Date/Time   NA 141 05/12/2014 0849   NA 137 12/13/2013 2359   K 3.9 05/12/2014 0849   K 3.8 12/13/2013 2359   CL 103 12/13/2013 2359   CO2 26 05/12/2014 0849   CO2 25 12/13/2013 2359   GLUCOSE 101 05/12/2014 0849   GLUCOSE 161* 12/13/2013 2359   BUN 11.7 05/12/2014 0849   BUN 10 12/13/2013 2359   CREATININE 1.0 05/12/2014 0849   CREATININE 0.91 12/13/2013 2359   CALCIUM 9.0 05/12/2014 0849   CALCIUM 8.3* 12/13/2013 2359   PROT 6.9 05/12/2014 0849   PROT 7.2 11/11/2013 1415   ALBUMIN 3.8 05/12/2014 0849   ALBUMIN 3.5 11/11/2013 1415   AST 19 05/12/2014 0849   AST 27 11/11/2013 1415   ALT 16 05/12/2014 0849   ALT 75* 11/11/2013 1415   ALKPHOS 72 05/12/2014 0849   ALKPHOS 109 11/11/2013 1415   BILITOT 0.73 05/12/2014 0849   BILITOT 0.4 11/11/2013 1415   GFRNONAA 74* 12/13/2013 2359   GFRAA 86* 12/13/2013 2359     Impression:  The patient is tolerating radiotherapy.   Plan:  Continue radiotherapy as planned. Will notify med/onc that pt is not willing to take Xeloda any more due to N/V -----------------------------------  Paula [name]  Paula Moos, MD

## 2014-05-27 ENCOUNTER — Ambulatory Visit (HOSPITAL_COMMUNITY): Payer: Self-pay | Admitting: Dentistry

## 2014-05-27 ENCOUNTER — Ambulatory Visit
Admission: RE | Admit: 2014-05-27 | Discharge: 2014-05-27 | Disposition: A | Discharge status: Home / Self Care | Payer: Medicaid Other | Source: Ambulatory Visit | Attending: Radiation Oncology | Admitting: Radiation Oncology

## 2014-05-27 ENCOUNTER — Ambulatory Visit: Payer: Medicaid Other

## 2014-05-27 ENCOUNTER — Encounter (HOSPITAL_COMMUNITY): Payer: Self-pay | Admitting: Dentistry

## 2014-05-27 ENCOUNTER — Encounter (INDEPENDENT_AMBULATORY_CARE_PROVIDER_SITE_OTHER): Payer: Self-pay

## 2014-05-27 VITALS — BP 115/70 | HR 60 | Temp 97.9°F

## 2014-05-27 DIAGNOSIS — K029 Dental caries, unspecified: Secondary | ICD-10-CM

## 2014-05-27 DIAGNOSIS — K0530 Chronic periodontitis, unspecified: Secondary | ICD-10-CM

## 2014-05-27 DIAGNOSIS — K011 Impacted teeth: Secondary | ICD-10-CM

## 2014-05-27 DIAGNOSIS — M264 Malocclusion, unspecified: Secondary | ICD-10-CM

## 2014-05-27 DIAGNOSIS — K08409 Partial loss of teeth, unspecified cause, unspecified class: Secondary | ICD-10-CM

## 2014-05-27 DIAGNOSIS — C163 Malignant neoplasm of pyloric antrum: Secondary | ICD-10-CM

## 2014-05-27 DIAGNOSIS — C169 Malignant neoplasm of stomach, unspecified: Secondary | ICD-10-CM

## 2014-05-27 DIAGNOSIS — IMO0002 Reserved for concepts with insufficient information to code with codable children: Secondary | ICD-10-CM

## 2014-05-27 DIAGNOSIS — M270 Developmental disorders of jaws: Secondary | ICD-10-CM

## 2014-05-27 DIAGNOSIS — K036 Deposits [accretions] on teeth: Secondary | ICD-10-CM

## 2014-05-27 NOTE — Progress Notes (Signed)
DENTAL CONSULTATION  Date of Consultation:  05/27/2014 Patient Name:   Paula [name] Date of Birth:   [birth year] Medical Record Number: [id number]  VITALS: BP 115/70 mmHg  Pulse 60  Temp(Src) 97.9 F (36.6 C) (Oral)  CHIEF COMPLAINT: Patient referred by Dr. [name] for evaluation of poor dentition.  HPI: Paula [name] is a 49 year old female with history of stomach cancer. Patient underwent surgical resection on 09/20/2013 and this was followed by chemotherapy with Dr. [name]. Patient is currently undergoing radiation therapy with Dr. Isidore Moos. Patient is also on Xeloda therapy. Patient is now seen to rule out dental infection that may affect the patient's systemic health.  Patient currently denies having any acute dental pain, infection, or swelling. Patient was last seen approximately 2014 for two upper right quadrant root canal therapies by an endodontist. Patient was previously seen by a Dr. Nyoka Cowden in [place], [facility] as her primary dentist.  PROBLEM LIST: Patient Active Problem List   Diagnosis Date Noted  . Neutropenia 02/24/2014  . 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 Hypoalbuminemia 12/18/2013  . Malfunction of device 12/18/2013  . Leg edema 12/18/2013  . Neoplasm related pain 12/18/2013  . Other pancytopenia 12/13/2013  . Hyponatremia 12/12/2013  . Hypokalemia 12/12/2013  . Weight loss 12/04/2013  . PEG tube malfunction 12/04/2013  . Dehiscence of incision 12/04/2013  . Family history of cancer   . Cancer of antrum of stomach 11/07/2013  . 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 Palliative care encounter 11/04/2013  . Nausea & vomiting 11/04/2013  . Abdominal pain, left lower quadrant 11/04/2013  . Acute kidney injury 11/03/2013  . Chronic low back pain 10/31/2013  . Pain of left upper arm 10/28/2013  . 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 Constipation, chronic 10/28/2013  . Protein-calorie malnutrition, severe 10/23/2013  . Anemia,  unspecified 10/23/2013  . Intractable nausea and vomiting 10/22/2013  . Back pain 10/10/2013  . Malnutrition of moderate degree 09/19/2013  . 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 Antral ulcer 09/16/2013  . Migraine headache 09/14/2013  . PTSD (post-traumatic stress disorder) 08/19/2013  . Left knee injury 07/30/2013  . Low back pain 07/30/2013  . Mitral regurgitation 02/15/2013  . Chest pain 01/23/2012  . Essential hypertension 01/23/2012  . Murmur 01/23/2012    PMH: Past Medical History  Diagnosis Date  . Headache(784.0) 2011    Migraines  . Esophageal reflux     On omeprazole  . Costochondritis   . Hypertension     started around 2011  . Ovarian cyst   . Stomach cancer   . Family history of cancer   . Heart murmur   . On antineoplastic chemotherapy     5-FU and leucovorin    PSH: Past Surgical History  Procedure Laterality Date  . Oophorectomy      Around 1985 left ovary removal  . Tonsillectomy      Around 1994  . Tubal ligation    . Esophagogastroduodenoscopy N/A 09/16/2013    Procedure: ESOPHAGOGASTRODUODENOSCOPY (EGD);  Surgeon: Wonda Horner, MD;  Location: [facility];  Service: Endoscopy;  Laterality: N/A;  . Laparoscopy N/A 09/20/2013    Procedure: DIAGNOSTIC LAPAROSCOPY,DISTAL GASTRECTOMY AND FEEDING GASTROJEJUNOSTOMY;  Surgeon: [name], MD;  Location: [facility];  Service: General;  Laterality: N/A;  . Portacath placement Left 11/18/2013    Procedure: INSERTION PORT-A-CATH;  Surgeon: [name], MD;  Location: [facility];  Service: General;  Laterality: Left;    ALLERGIES: No  Known Allergies  MEDICATIONS: Current Outpatient Prescriptions  Medication Sig Dispense Refill  . capecitabine (XELODA) 500 MG tablet Take 2 tablets (1,000 mg total) by mouth 2 (two) times daily after a meal. 120 tablet 1  . carvedilol (COREG) 6.25 MG tablet Take 6.25 mg by mouth 2 (two) times daily with a meal.    . hyaluronate  sodium (RADIAPLEXRX) GEL Apply 1 application topically 2 (two) times daily.    Marland Kitchen lidocaine (LIDODERM) 5 % 1 patch daily as needed.    . lidocaine-prilocaine (EMLA) cream Apply 1 application topically as needed. Apply to port 1 hour before chemo 30 g 0  . LORazepam (ATIVAN) 1 MG tablet Take 1 tablet (1 mg total) by mouth every 8 (eight) hours as needed for anxiety. 30 tablet 0  . metoCLOPramide (REGLAN) 10 MG tablet Take 1 tablet (10 mg total) by mouth 4 (four) times daily. 120 tablet 3  . Multiple Vitamins-Minerals (MULTIVITAMIN & MINERAL PO) Take 1 tablet by mouth daily.    . Nutritional Supplements (ENSURE COMPLETE PO) Take 237 mLs by mouth daily. Takes a little at a time, in order to keep it down.    . ondansetron (ZOFRAN-ODT) 4 MG disintegrating tablet Take 4 mg by mouth every 8 (eight) hours as needed for nausea or vomiting.    Marland Kitchen OVER THE COUNTER MEDICATION     . oxyCODONE (OXY IR/ROXICODONE) 5 MG immediate release tablet Take 1 tablet (5 mg total) by mouth every 4 (four) hours as needed for severe pain. 45 tablet 0  . pantoprazole (PROTONIX) 40 MG tablet Take 1 tablet (40 mg total) by mouth 2 (two) times daily before a meal. 60 tablet 3   No current facility-administered medications for this visit.    LABS: Lab Results  Component Value Date   WBC 3.7* 05/12/2014   HGB 10.7* 05/12/2014   HCT 34.5* 05/12/2014   MCV 93.0 05/12/2014   PLT 266 05/12/2014      Component Value Date/Time   NA 141 05/12/2014 0849   NA 137 12/13/2013 2359   K 3.9 05/12/2014 0849   K 3.8 12/13/2013 2359   CL 103 12/13/2013 2359   CO2 26 05/12/2014 0849   CO2 25 12/13/2013 2359   GLUCOSE 101 05/12/2014 0849   GLUCOSE 161* 12/13/2013 2359   BUN 11.7 05/12/2014 0849   BUN 10 12/13/2013 2359   CREATININE 1.0 05/12/2014 0849   CREATININE 0.91 12/13/2013 2359   CALCIUM 9.0 05/12/2014 0849   CALCIUM 8.3* 12/13/2013 2359   GFRNONAA 74* 12/13/2013 2359   GFRAA 86* 12/13/2013 2359   Lab Results   Component Value Date   INR 1.68* 12/13/2013   No results found for: PTT  SOCIAL HISTORY: History   Social History  . Marital Status: Married    Spouse Name: N/A  . Number of Children: 2  . Years of Education: N/A   Occupational History  .  [employer]   Social History Main Topics  . Smoking status: Former Research scientist (life sciences)  . Smokeless tobacco: Never Used     Comment: Stopped 2004  . Alcohol Use: No  . Drug Use: No  . Sexual Activity: Not on file   Other Topics Concern  . Not on file   Social History Narrative    FAMILY HISTORY: Family History  Problem Relation Age of Onset  . Hypertension Mother   . Diabetes Mellitus II Mother   . Hypertension Sister   . Hypertension Brother   . Heart  failure Maternal Grandmother   . Hypertension Maternal Grandfather   . Prostate cancer Paternal Uncle     living at 86  . Gastric cancer Cousin 34    pat first cousin; daughter of pat uncle living with prostate ca   . Gastric cancer Cousin     mat first cousin; daughter of mat aunt who died in 73s due to heart issues  . Prostate cancer Paternal Uncle     deceased  . Prostate cancer Paternal Uncle     deceased  . Lung cancer Paternal Uncle     smoker; deceased    REVIEW OF SYSTEMS: Reviewed with the patient and included in dental record.  DENTAL HISTORY: CHIEF COMPLAINT: Patient referred by Dr. [name] for evaluation of poor dentition.  HPI: Zonnie A [name] is a 49 year old female with history of stomach cancer. Patient underwent surgical resection on 09/20/2013 and this was followed by chemotherapy with Dr. [name]. Patient is currently undergoing radiation therapy with Dr. Isidore Moos. Patient is also on Xeloda therapy. Patient is now seen to rule out dental infection that may affect the patient's systemic health.  Patient currently denies having any acute dental pain, infection, or swelling. Patient was last seen approximately 2014 for two upper right quadrant root  canal therapies by an endodontist. Patient was previously seen by a Dr. Nyoka Cowden in [place], [facility] as her primary dentist.  DENTAL EXAMINATION: GENERAL: Patient is a well-developed, well-nourished female in no acute distress. HEAD AND NECK: There is no palpable submandibular lymphadenopathy. The patient denies acute TMJ symptoms. INTRAORAL EXAM: Patient has normal saliva. The patient has bilateral mandibular lingual tori. There is no evidence of oral abscess formation. DENTITION: The patient is missing tooth numbers 16, 17, and 32.  PERIODONTAL: The patient has chronic periodontitis with plaque and calculus accumulations, selective areas of gingival recession, and incipient tooth mobility. DENTAL CARIES/SUBOPTIMAL RESTORATIONS: Patient has multiple dental caries as per dental charting form. ENDODONTIC: She currently denies acute pulpitis symptoms. Patient has had previous root canal therapies associated with tooth numbers 8, 11, 12, and 13. The root canal therapy of tooth numbers  11,12,13 are exposed to the oral environment.  CROWN AND BRIDGE: There are no crown or bridge restorations. PROSTHODONTIC: Patient denies having any partial dentures. OCCLUSION: Patient has a poor occlusal scheme and malocclusion.  RADIOGRAPHIC INTERPRETATION: An orthopantogram was taken and supplemented with a full series of dental radiographs. There are missing tooth numbers 16, 17 and 32. There is incipient to moderate bone loss. Multiple dental caries are noted. There are previous root canal therapies associated with tooth numbers 8, 11, 12, and 13. There is supra-eruption and drifting of the unopposed teeth into the edentulous areas. There are radiopacities of the lower jaw consistent with bilateral mandibular lingual tori.   ASSESSMENTS: 1. Gastric cancer-status post surgical resection. 2. History of chemotherapy and current radiation therapy 3. Rampant dental caries 4. Chronic periodontitis with bone  loss 5. Accretions 6. Gingival recession 7. Incipient tooth mobility 8. Multiple missing teeth 9. Supra-eruption and drifting of the unopposed teeth into the edentulous areas 10. Malocclusion 11. History of root canal therapy of tooth numbers 11, 12, 13 now exposed to the oral environment. 12. Bilateral mandibular lingual tori 13. Partial bony impacted #1.  PLAN/RECOMMENDATIONS: 1. I discussed the risks, benefits, and complications of various treatment options with the patient in relationship to her medical and dental conditions, chemotherapy, and radiation therapy. We discussed various treatment options to include no treatment, multiple extractions  with alveoloplasty, pre-prosthetic surgery as indicated, periodontal therapy and periodontal surgery, dental restorations, root canal therapy, crown and bridge therapy, implant therapy, and replacement of missing teeth as indicated. We discussed referral to a new primary dentist, oral surgeon, endodontist, and periodontist as indicated.  We discussed timing of dental treatment in relationship to her current radiation therapy and future chemotherapy. We discussed Medicaid dental coverage if patient did obtain dental Medicaid. The patient currently wishes to defer dental treatment at this time to complete radiation therapy. In the meantime, the patient will check with financial counselors with the [place] to determine what her status is concerning Medicaid coverage. If the patient obtains Medicaid dental coverage, we will refer patient to [facility] for initial periodontal therapy, dental restorations, and discussion of treatment options concerning other maxillary teeth. They will then refer patient to a periodontist, endodontist, or oral surgeon as indicated.  Patient is to contact Dental Medicine if acute problems arise before then.   2. Discussion of findings with medical team and coordination of future medical and dental care as  needed.  I spent in excess of  120 minutes during the conduct of this consultation and >50% of this time involved direct face-to-face encounter for counseling and/or coordination of the patient's care.    Lenn Cal, DDS

## 2014-05-28 ENCOUNTER — Ambulatory Visit
Admission: RE | Admit: 2014-05-28 | Discharge: 2014-05-28 | Disposition: A | Discharge status: Home / Self Care | Payer: Medicaid Other | Source: Ambulatory Visit | Attending: Radiation Oncology | Admitting: Radiation Oncology

## 2014-05-28 ENCOUNTER — Ambulatory Visit: Payer: Medicaid Other

## 2014-05-28 DIAGNOSIS — C163 Malignant neoplasm of pyloric antrum: Secondary | ICD-10-CM | POA: Diagnosis not present

## 2014-05-28 NOTE — Patient Instructions (Signed)
Patient is complete radiation therapy with Dr. Isidore Moos. Patient to follow up with the financial counselors with the [place] to determine her Medicaid status. Patient to call once Medicaid dental coverage is obtained or if she wishes to proceed with dental treatment or be referred to a new primary dentist as indicated. Dr. Enrique Sack

## 2014-05-29 ENCOUNTER — Ambulatory Visit
Admission: RE | Admit: 2014-05-29 | Discharge: 2014-05-29 | Disposition: A | Discharge status: Home / Self Care | Payer: Medicaid Other | Source: Ambulatory Visit | Attending: Radiation Oncology | Admitting: Radiation Oncology

## 2014-05-29 ENCOUNTER — Ambulatory Visit: Payer: Medicaid Other

## 2014-05-29 DIAGNOSIS — C163 Malignant neoplasm of pyloric antrum: Secondary | ICD-10-CM | POA: Diagnosis not present

## 2014-05-30 ENCOUNTER — Ambulatory Visit: Payer: Medicaid Other

## 2014-05-30 ENCOUNTER — Ambulatory Visit
Admission: RE | Admit: 2014-05-30 | Discharge: 2014-05-30 | Disposition: A | Discharge status: Home / Self Care | Payer: Medicaid Other | Source: Ambulatory Visit | Attending: Radiation Oncology | Admitting: Radiation Oncology

## 2014-05-30 DIAGNOSIS — C163 Malignant neoplasm of pyloric antrum: Secondary | ICD-10-CM | POA: Diagnosis not present

## 2014-06-02 ENCOUNTER — Ambulatory Visit: Payer: Medicaid Other

## 2014-06-02 ENCOUNTER — Ambulatory Visit: Payer: No Typology Code available for payment source | Admitting: Radiation Oncology

## 2014-06-02 ENCOUNTER — Ambulatory Visit
Admission: RE | Admit: 2014-06-02 | Discharge: 2014-06-02 | Disposition: A | Discharge status: Home / Self Care | Payer: Medicaid Other | Source: Ambulatory Visit | Attending: Radiation Oncology | Admitting: Radiation Oncology

## 2014-06-03 ENCOUNTER — Telehealth: Payer: Self-pay | Admitting: *Deleted

## 2014-06-03 ENCOUNTER — Ambulatory Visit
Admission: RE | Admit: 2014-06-03 | Discharge: 2014-06-03 | Disposition: A | Discharge status: Home / Self Care | Payer: Medicaid Other | Source: Ambulatory Visit | Attending: Radiation Oncology | Admitting: Radiation Oncology

## 2014-06-03 ENCOUNTER — Telehealth: Payer: Self-pay | Admitting: Nutrition

## 2014-06-03 ENCOUNTER — Other Ambulatory Visit: Payer: Self-pay | Admitting: Medical Oncology

## 2014-06-03 ENCOUNTER — Telehealth: Payer: Self-pay | Admitting: Internal Medicine

## 2014-06-03 ENCOUNTER — Encounter: Payer: Self-pay | Admitting: Nutrition

## 2014-06-03 ENCOUNTER — Encounter: Payer: Self-pay | Admitting: *Deleted

## 2014-06-03 ENCOUNTER — Ambulatory Visit: Payer: Medicaid Other

## 2014-06-03 DIAGNOSIS — C163 Malignant neoplasm of pyloric antrum: Secondary | ICD-10-CM | POA: Diagnosis not present

## 2014-06-03 DIAGNOSIS — C169 Malignant neoplasm of stomach, unspecified: Secondary | ICD-10-CM

## 2014-06-03 MED ORDER — LORAZEPAM 1 MG PO TABS: 1.0000 mg | ORAL_TABLET | Freq: Three times a day (TID) | ORAL | Status: DC | PRN

## 2014-06-03 NOTE — Progress Notes (Signed)
Patient did not show up for scheduled nutrition appointment. 

## 2014-06-03 NOTE — Telephone Encounter (Signed)
Confirm appointment for April 25.

## 2014-06-03 NOTE — Telephone Encounter (Signed)
Confirm appointment rescheduled to after Rad onc finished per staff message. Patient confirmed 04/27 appt.

## 2014-06-03 NOTE — Telephone Encounter (Signed)
Contacted patient to reschedule nutrition appointment at her request.   Patient rescheduled for Friday April 22 after radiation therapy.  **Disclaimer: This note was dictated with voice recognition software. Similar sounding words can inadvertently be transcribed and this note may contain transcription errors which may not have been corrected upon publication of note.**

## 2014-06-03 NOTE — Telephone Encounter (Signed)
Received call this morning from patient requesting refill on Lorazepam. Informed her that Dr Isidore Moos is in [place] office today, and this RN will call her with patient's request and call pt back. Confirmed her pharmacy of choice. She verbalized understanding. Reminded pt that she missed nutrition appt this morning. She stated she was unaware of appt; advised her this RN will call nutritionist and request she call pt and reschedule. Patient also inquired about chemo and radiation causing depression. Advised that her condition and all the treatments she is going through could most likely cause her to be depressed. Informed her of the [facility] patient support team and offered to contact CSW for her; she stated she would appreciate the referral.  Spoke with Dr Isidore Moos who authorized refill on Lorazepam, disp 30 tabs, take 1 tab every 8 hrs, no refills. Called [facility] [phone] and left message with refill information. Left voice mail for Dory Peru, nutritionist and Polo Riley, CSW requesting they contact patient and make appointments with her. Then called Ms [name] and informed her Dr Isidore Moos refilled medication she requested and that this RN left messages for nutritionist and CSW.  Ms Smith-Golden verbalized understanding of above and appreciation.

## 2014-06-03 NOTE — Telephone Encounter (Signed)
Pt left me a message to refill her ativan prob after she called xrt. Pt said she wanted to get ativan at [facility] because she has funds there. I called [facility] and cancelled rx from Dr Isidore Moos -they did not have refill order yet. I  called it in to [facility] While on the phone with pt I asked her about xeloda . She said she took them for 1 week then stopped them 2 weeks ago because of vomiting and not able to get out of bed. " i felt like I just took Chemo".,

## 2014-06-03 NOTE — Progress Notes (Signed)
For patient's benefit, left voice mail for Dr Worthy Flank RN re: per his 05/12/14 note patient was to return to see him for FU in 2 weeks. Requested RN check on this to see if patient was 'no show' or cancelled that FU appointment. Also informed her that Dr Isidore Moos authorized refill on Lorazepam and this was called in to patient's pharmacy.

## 2014-06-04 ENCOUNTER — Ambulatory Visit
Admission: RE | Admit: 2014-06-04 | Discharge: 2014-06-04 | Disposition: A | Discharge status: Home / Self Care | Payer: No Typology Code available for payment source | Source: Ambulatory Visit | Attending: Radiation Oncology | Admitting: Radiation Oncology

## 2014-06-04 ENCOUNTER — Ambulatory Visit
Admission: RE | Admit: 2014-06-04 | Discharge: 2014-06-04 | Disposition: A | Discharge status: Home / Self Care | Payer: Medicaid Other | Source: Ambulatory Visit | Attending: Radiation Oncology | Admitting: Radiation Oncology

## 2014-06-04 ENCOUNTER — Ambulatory Visit: Payer: Medicaid Other

## 2014-06-04 ENCOUNTER — Ambulatory Visit: Payer: No Typology Code available for payment source | Admitting: Radiation Oncology

## 2014-06-04 DIAGNOSIS — C163 Malignant neoplasm of pyloric antrum: Secondary | ICD-10-CM | POA: Diagnosis not present

## 2014-06-05 ENCOUNTER — Ambulatory Visit
Admission: RE | Admit: 2014-06-05 | Discharge: 2014-06-05 | Disposition: A | Discharge status: Home / Self Care | Payer: Medicaid Other | Source: Ambulatory Visit | Attending: Radiation Oncology | Admitting: Radiation Oncology

## 2014-06-05 ENCOUNTER — Ambulatory Visit
Admission: RE | Admit: 2014-06-05 | Discharge: 2014-06-05 | Disposition: A | Discharge status: Home / Self Care | Payer: No Typology Code available for payment source | Source: Ambulatory Visit | Attending: Radiation Oncology | Admitting: Radiation Oncology

## 2014-06-05 ENCOUNTER — Ambulatory Visit: Payer: Medicaid Other

## 2014-06-05 VITALS — BP 123/83 | HR 69 | Temp 97.6°F | Resp 16 | Wt 127.2 lb

## 2014-06-05 DIAGNOSIS — C169 Malignant neoplasm of stomach, unspecified: Secondary | ICD-10-CM

## 2014-06-05 DIAGNOSIS — C163 Malignant neoplasm of pyloric antrum: Secondary | ICD-10-CM | POA: Diagnosis not present

## 2014-06-05 NOTE — Progress Notes (Signed)
Patient states she has mild pain under right side of rib cage. She states she feels it is from lying on hard treatment table. She states she began n/v on Tues night, last time she vomited was Wed night at 7 pm. She is taking Zofran but not regularly. Advised she may need to take every 8 hours while receiving radiation treatments to control n/v. She verbalized understanding. She states she also began her menses on Sunday, and it causes her to be nauseated.  She lost 5 lbs since 05/26/14. She is fatigued. She denies urinary issues, states she is occasionally constipated. She is taking Oxycodone sporadically for pain. Advised she may try OTC stool softeners.  BP 123/83 mmHg  Pulse 69  Temp(Src) 97.6 F (36.4 C) (Oral)  Resp 16  Wt 127 lb 3.2 oz (57.698 kg)

## 2014-06-05 NOTE — Progress Notes (Addendum)
Weekly Management Note:  Site: Stomach/abdomen Current Dose:  3060  cGy Projected Dose: 4500  cGy  Narrative: The patient is seen today for routine under treatment assessment. CBCT/MVCT images/port films were reviewed. The chart was reviewed.   She is doing recently well, however she has been having some difficulty with nausea and vomiting.  She had nausea and vomiting Tuesday evening and then again yesterday evening.  She has been taking Zofran when necessary.  She is lost 5 pounds over the past 10 days.  She takes an infrequent oxycodone for discomfort and right-sided rib cage pain which she thinks is related to her treatment table.  She is not taking any chemotherapy at this time, but I see that she is on Xeloda.  She denies diarrhea and does have occasional constipation which may or may not be related to her pain medication.  Physical Examination:  Filed Vitals:   06/05/14 0954  BP: 123/83  Pulse: 69  Temp: 97.6 F (36.4 C)  Resp: 16  .  Weight: 127 lb 3.2 oz (57.698 kg).  Her abdomen is soft and nontender.  There is no hepatomegaly.  Laboratory data: Lab Results  Component Value Date   WBC 3.7* 05/12/2014   HGB 10.7* 05/12/2014   HCT 34.5* 05/12/2014   MCV 93.0 05/12/2014   PLT 266 05/12/2014    Impression: Tolerating radiation therapy well except for recent nausea vomiting.  We recommend that she takes Zofran every 8 hours instead of when necessary.  She may take a stool softener if she is bothered by constipation.  I also encouraged her to increase her caloric intake with an sure and also to increase her fluid intake.  Plan: Continue radiation therapy as planned.

## 2014-06-05 NOTE — Addendum Note (Signed)
Encounter addended by: Arloa Koh, MD on: 06/05/2014 10:30 AM<BR>     Documentation filed: Notes Section

## 2014-06-06 ENCOUNTER — Ambulatory Visit
Admission: RE | Admit: 2014-06-06 | Discharge: 2014-06-06 | Disposition: A | Discharge status: Home / Self Care | Payer: Medicaid Other | Source: Ambulatory Visit | Attending: Radiation Oncology | Admitting: Radiation Oncology

## 2014-06-06 ENCOUNTER — Ambulatory Visit: Payer: Medicaid Other

## 2014-06-06 DIAGNOSIS — C163 Malignant neoplasm of pyloric antrum: Secondary | ICD-10-CM | POA: Diagnosis not present

## 2014-06-09 ENCOUNTER — Ambulatory Visit
Admission: RE | Admit: 2014-06-09 | Discharge: 2014-06-09 | Disposition: A | Discharge status: Home / Self Care | Payer: Medicaid Other | Source: Ambulatory Visit | Attending: Radiation Oncology | Admitting: Radiation Oncology

## 2014-06-09 ENCOUNTER — Ambulatory Visit
Admission: RE | Admit: 2014-06-09 | Discharge: 2014-06-09 | Disposition: A | Discharge status: Home / Self Care | Payer: No Typology Code available for payment source | Source: Ambulatory Visit | Attending: Radiation Oncology | Admitting: Radiation Oncology

## 2014-06-09 ENCOUNTER — Ambulatory Visit: Payer: Medicaid Other

## 2014-06-09 VITALS — BP 131/70 | HR 57 | Temp 97.9°F | Wt 130.4 lb

## 2014-06-09 DIAGNOSIS — C163 Malignant neoplasm of pyloric antrum: Secondary | ICD-10-CM

## 2014-06-09 NOTE — Progress Notes (Signed)
   Weekly Management Note:  Outpatient    ICD-9-CM ICD-10-CM   1. Cancer of antrum of stomach 151.2 C16.3     Current Dose:  34.2 Gy  Projected Dose: 45 Gy   Narrative:  The patient presents for routine under treatment assessment.  CBCT/MVCT images/Port film x-rays were reviewed.  The chart was checked.   Weekly assessment of radiation to abdomen.Completed 19 of 25 fractions.Denies nausea or cramping.Has mild headache this morning.Skin with mild tanning.Informed she may apply radiaplex.Bowels normal.States she took self off xeloda approximately 3 weeks ago as it made her sick. She stopped Xeloda due to N/V. These symptoms are now "better"  BP 131/70 mmHg  Pulse 57  Temp(Src) 97.9 F (36.6 C)  Wt 130 lb 6.4 oz (59.149 kg)  SpO2 100%    Physical Findings:  Wt Readings from Last 3 Encounters:  05/12/14 133 lb 1.6 oz (60.374 kg)  05/12/14 132 lb 12.8 oz (60.238 kg)  04/23/14 129 lb 3.2 oz (58.605 kg)    weight is 130 lb 6.4 oz (59.149 kg). Her temperature is 97.9 F (36.6 C). Her blood pressure is 131/70 and her pulse is 57. Her oxygen saturation is 100%.  NAD, ambulatory  CBC    Component Value Date/Time   WBC 3.7* 05/12/2014 0849   WBC 1.9* 12/13/2013 2304   RBC 3.71 05/12/2014 0849   RBC 4.17 12/13/2013 2304   HGB 10.7* 05/12/2014 0849   HGB 10.7* 12/13/2013 2304   HCT 34.5* 05/12/2014 0849   HCT 31.9* 12/13/2013 2304   PLT 266 05/12/2014 0849   PLT 109* 12/13/2013 2304   MCV 93.0 05/12/2014 0849   MCV 76.5* 12/13/2013 2304   MCH 28.9 05/12/2014 0849   MCH 25.7* 12/13/2013 2304   MCHC 31.1* 05/12/2014 0849   MCHC 33.5 12/13/2013 2304   RDW 13.9 05/12/2014 0849   RDW 16.4* 12/13/2013 2304   LYMPHSABS 1.5 05/12/2014 0849   LYMPHSABS 0.9 12/12/2013 1522   MONOABS 0.3 05/12/2014 0849   MONOABS 0.2 12/12/2013 1522   EOSABS 0.1 05/12/2014 0849   EOSABS 0.1 12/12/2013 1522   BASOSABS 0.0 05/12/2014 0849   BASOSABS 0.0 12/12/2013 1522     CMP     Component Value  Date/Time   NA 141 05/12/2014 0849   NA 137 12/13/2013 2359   K 3.9 05/12/2014 0849   K 3.8 12/13/2013 2359   CL 103 12/13/2013 2359   CO2 26 05/12/2014 0849   CO2 25 12/13/2013 2359   GLUCOSE 101 05/12/2014 0849   GLUCOSE 161* 12/13/2013 2359   BUN 11.7 05/12/2014 0849   BUN 10 12/13/2013 2359   CREATININE 1.0 05/12/2014 0849   CREATININE 0.91 12/13/2013 2359   CALCIUM 9.0 05/12/2014 0849   CALCIUM 8.3* 12/13/2013 2359   PROT 6.9 05/12/2014 0849   PROT 7.2 11/11/2013 1415   ALBUMIN 3.8 05/12/2014 0849   ALBUMIN 3.5 11/11/2013 1415   AST 19 05/12/2014 0849   AST 27 11/11/2013 1415   ALT 16 05/12/2014 0849   ALT 75* 11/11/2013 1415   ALKPHOS 72 05/12/2014 0849   ALKPHOS 109 11/11/2013 1415   BILITOT 0.73 05/12/2014 0849   BILITOT 0.4 11/11/2013 1415   GFRNONAA 74* 12/13/2013 2359   GFRAA 86* 12/13/2013 2359     Impression:  The patient is tolerating radiotherapy.   Plan:  Continue radiotherapy as planned. -----------------------------------  Eppie Gibson, MD

## 2014-06-09 NOTE — Progress Notes (Signed)
Weekly assessment of radiation to abdomen.Completed 19 of 25 fractions.Denies nausea or cramping.Has mild headache this morning.Skin with mild tanning.Informed she may apply radiaplex.Bowels normal.States she took self off xeloda approximately 3 weeks ago as it made her sick.BP 131/70 mmHg  Pulse 57  Temp(Src) 97.9 F (36.6 C)  Wt 130 lb 6.4 oz (59.149 kg)  SpO2 100%

## 2014-06-10 ENCOUNTER — Ambulatory Visit
Admission: RE | Admit: 2014-06-10 | Discharge: 2014-06-10 | Disposition: A | Discharge status: Home / Self Care | Payer: Medicaid Other | Source: Ambulatory Visit | Attending: Radiation Oncology | Admitting: Radiation Oncology

## 2014-06-10 ENCOUNTER — Ambulatory Visit: Payer: Medicaid Other

## 2014-06-10 DIAGNOSIS — C163 Malignant neoplasm of pyloric antrum: Secondary | ICD-10-CM | POA: Diagnosis not present

## 2014-06-11 ENCOUNTER — Ambulatory Visit: Payer: Medicaid Other

## 2014-06-11 ENCOUNTER — Ambulatory Visit: Payer: Medicaid Other | Admitting: Radiation Oncology

## 2014-06-11 ENCOUNTER — Ambulatory Visit
Admission: RE | Admit: 2014-06-11 | Discharge: 2014-06-11 | Disposition: A | Discharge status: Home / Self Care | Payer: Medicaid Other | Source: Ambulatory Visit | Attending: Radiation Oncology | Admitting: Radiation Oncology

## 2014-06-11 DIAGNOSIS — C163 Malignant neoplasm of pyloric antrum: Secondary | ICD-10-CM | POA: Diagnosis not present

## 2014-06-12 ENCOUNTER — Ambulatory Visit: Payer: Medicaid Other

## 2014-06-12 ENCOUNTER — Ambulatory Visit
Admission: RE | Admit: 2014-06-12 | Discharge: 2014-06-12 | Disposition: A | Discharge status: Home / Self Care | Payer: Medicaid Other | Source: Ambulatory Visit | Attending: Radiation Oncology | Admitting: Radiation Oncology

## 2014-06-12 ENCOUNTER — Encounter: Payer: Self-pay | Admitting: *Deleted

## 2014-06-12 DIAGNOSIS — C163 Malignant neoplasm of pyloric antrum: Secondary | ICD-10-CM | POA: Diagnosis not present

## 2014-06-12 NOTE — Progress Notes (Signed)
[facility]  Clinical Social Work was referred by radiation oncology RN for assessment of psychosocial needs. RN shared that patient reported feeling depressed by current financial and medical situation.  CSW contacted patient by phone to process current feelings.  Patient shared she is feeling discouraged and does feel it would be helpful to talk with someone further to explore her emotions.  CSW validated patients concerns and discussed difficulty coping with things that are "out of her control", such as waiting on disability and medicaid, cancer treatment, etc.  Patient requested counselor in [facility] to be more convenient to her home.  CSW made referral to  Laurier Nancy, LCSW at [facility] in [place], [place] is unaware of any additional financial resources patient may qualify for.   Polo Riley, MSW, LCSW, OSW-C Clinical Social Worker [facility] [phone]

## 2014-06-13 ENCOUNTER — Encounter: Payer: Self-pay | Admitting: Nutrition

## 2014-06-13 ENCOUNTER — Ambulatory Visit
Admission: RE | Admit: 2014-06-13 | Discharge: 2014-06-13 | Disposition: A | Discharge status: Home / Self Care | Payer: Medicaid Other | Source: Ambulatory Visit | Attending: Radiation Oncology | Admitting: Radiation Oncology

## 2014-06-13 ENCOUNTER — Ambulatory Visit: Payer: Medicaid Other

## 2014-06-13 DIAGNOSIS — C163 Malignant neoplasm of pyloric antrum: Secondary | ICD-10-CM | POA: Diagnosis not present

## 2014-06-13 NOTE — Addendum Note (Signed)
Encounter addended by: Benn Moulder, RN on: 06/13/2014  1:22 PM<BR>     Documentation filed: Demographics Visit

## 2014-06-13 NOTE — Progress Notes (Signed)
Patient did not show up for nutrition appointment. 

## 2014-06-16 ENCOUNTER — Ambulatory Visit: Payer: Medicaid Other

## 2014-06-16 ENCOUNTER — Ambulatory Visit
Admission: RE | Admit: 2014-06-16 | Discharge: 2014-06-16 | Disposition: A | Discharge status: Home / Self Care | Payer: Medicaid Other | Source: Ambulatory Visit | Attending: Radiation Oncology | Admitting: Radiation Oncology

## 2014-06-16 ENCOUNTER — Other Ambulatory Visit: Payer: Self-pay

## 2014-06-16 ENCOUNTER — Ambulatory Visit
Admission: RE | Admit: 2014-06-16 | Discharge: 2014-06-16 | Disposition: A | Discharge status: Home / Self Care | Payer: No Typology Code available for payment source | Source: Ambulatory Visit | Attending: Radiation Oncology | Admitting: Radiation Oncology

## 2014-06-16 ENCOUNTER — Encounter: Payer: Self-pay | Admitting: Radiation Oncology

## 2014-06-16 ENCOUNTER — Ambulatory Visit: Payer: Self-pay | Admitting: Physician Assistant

## 2014-06-16 VITALS — BP 120/80 | HR 86 | Temp 98.2°F | Resp 12 | Wt 124.5 lb

## 2014-06-16 DIAGNOSIS — C163 Malignant neoplasm of pyloric antrum: Secondary | ICD-10-CM

## 2014-06-16 MED ORDER — METOCLOPRAMIDE HCL 10 MG PO TABS: 10.0000 mg | ORAL_TABLET | Freq: Two times a day (BID) | ORAL | Status: DC

## 2014-06-16 MED ORDER — ONDANSETRON 8 MG PO TBDP: 8.0000 mg | ORAL_TABLET | Freq: Three times a day (TID) | ORAL | Status: DC | PRN

## 2014-06-16 NOTE — Progress Notes (Signed)
Weekly Management Note:  Outpatient    ICD-9-CM ICD-10-CM   1. 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 Abdominal pain 7/10. Back pain possibly due to bouts of vomiting. Patient experiences acid reflux. Pt notes this has been the worst she has been feeling since start of treatment. Pt is negative for diarrhea. Physical exam performed.      . BP 120/80 mmHg  Pulse 86  Temp(Src) 98.2 F (36.8 C) (Oral)  Resp 12  Wt 124 lb 8 oz (56.473 kg)  SpO2 100%  Orthostatic standing vs BP 125/86, P-86, Pox 100 Reports feeling lightheaded when standing.    BP 120/80 mmHg  Pulse 86  Temp(Src) 98.2 F (36.8 C) (Oral)  Resp 12  Wt 124 lb 8 oz (56.473 kg)  SpO2 100%    Physical Findings:  Wt Readings from Last 3 Encounters:  06/16/14 124 lb 8 oz (56.473 kg)  05/12/14 133 lb 1.6 oz (60.374 kg)  05/12/14 132 lb 12.8 oz (60.238 kg)    weight is 124 lb 8 oz (56.473 kg). Her oral temperature is 98.2 F (36.8 C). Her blood pressure is 120/80 and her pulse is 86. Her respiration is 12 and oxygen saturation is 100%.  NAD, ambulatory  Abdomen tender but no guarding, rigidity or distention. Skin is minimally hyperpigmented at abdomen.   CBC    Component Value Date/Time   WBC 3.7* 05/12/2014 0849   WBC 1.9* 12/13/2013 2304   RBC 3.71 05/12/2014 0849   RBC 4.17 12/13/2013 2304   HGB 10.7* 05/12/2014 0849   HGB 10.7* 12/13/2013 2304   HCT 34.5* 05/12/2014 0849   HCT 31.9* 12/13/2013 2304   PLT 266 05/12/2014 0849   PLT 109* 12/13/2013 2304   MCV 93.0 05/12/2014 0849   MCV 76.5* 12/13/2013 2304   MCH 28.9 05/12/2014 0849   MCH 25.7* 12/13/2013 2304   MCHC 31.1* 05/12/2014 0849   MCHC 33.5 12/13/2013 2304   RDW 13.9 05/12/2014 0849    RDW 16.4* 12/13/2013 2304   LYMPHSABS 1.5 05/12/2014 0849   LYMPHSABS 0.9 12/12/2013 1522   MONOABS 0.3 05/12/2014 0849   MONOABS 0.2 12/12/2013 1522   EOSABS 0.1 05/12/2014 0849   EOSABS 0.1 12/12/2013 1522   BASOSABS 0.0 05/12/2014 0849   BASOSABS 0.0 12/12/2013 1522     CMP     Component Value Date/Time   NA 141 05/12/2014 0849   NA 137 12/13/2013 2359   K 3.9 05/12/2014 0849   K 3.8 12/13/2013 2359   CL 103 12/13/2013 2359   CO2 26 05/12/2014 0849   CO2 25 12/13/2013 2359   GLUCOSE 101 05/12/2014 0849   GLUCOSE 161* 12/13/2013 2359   BUN 11.7 05/12/2014 0849   BUN 10 12/13/2013 2359   CREATININE 1.0 05/12/2014 0849   CREATININE 0.91 12/13/2013 2359   CALCIUM 9.0 05/12/2014 0849   CALCIUM 8.3* 12/13/2013 2359   PROT 6.9 05/12/2014 0849   PROT 7.2 11/11/2013 1415   ALBUMIN 3.8 05/12/2014 0849   ALBUMIN 3.5 11/11/2013 1415   AST 19 05/12/2014 0849   AST 27 11/11/2013 1415   ALT 16 05/12/2014 0849   ALT 75* 11/11/2013 1415   ALKPHOS 72 05/12/2014  0849   ALKPHOS 109 11/11/2013 1415   BILITOT 0.73 05/12/2014 0849   BILITOT 0.4 11/11/2013 1415   GFRNONAA 74* 12/13/2013 2359   GFRAA 86* 12/13/2013 2359     Impression:  The patient is tolerating radiotherapy.   Plan:  Continue radiotherapy as planned. Recommended taking more Tums for acid reflux in addition to current meds.  Double Zofran dose, taper down Reglan, Ativan alternating with ODT Zofran for nausea. Labs and med/onc tomorrow; may need IV fluids depending on labs. Not orthostatic today F/u in 1-2 weeks (card given)  -----------------------------------  Eppie Gibson, MD  This document serves as a record of services personally performed by Eppie Gibson, MD. It was created on her behalf by Derek Mound, a trained medical scribe. The creation of this record is based on the scribe's personal observations and the provider's statements to them. This document has been checked and approved by the attending  provider.

## 2014-06-16 NOTE — Progress Notes (Signed)
She rates her pain as a 8 on a scale of 0-10. intermittent and aching over back/ribs. Pt complains of fatigue, weakness and poor appetite. Nausea, Vomiting, Heartburn and Abdominal Pain.  She states when she was vomiting this morning she noticed small amount bright red blood in her vomit. Her vomit is green/yellow in color. Pt reports vomiting, 4 times yesterday, and 3 times today.  She is taking Zofran. Pt reports having a bowel movement every other day.   Noted slight hyperpigmentation to abdominal area. BP 120/80 mmHg  Pulse 86  Temp(Src) 98.2 F (36.8 C) (Oral)  Resp 12  Wt 124 lb 8 oz (56.473 kg)  SpO2 100%  Orthostatic standing vs BP 125/86, P-86, Pox 100 Reports feeling lightheaded when standing.

## 2014-06-17 ENCOUNTER — Ambulatory Visit
Admission: RE | Admit: 2014-06-17 | Discharge: 2014-06-17 | Disposition: A | Discharge status: Home / Self Care | Payer: Medicaid Other | Source: Ambulatory Visit | Attending: Radiation Oncology | Admitting: Radiation Oncology

## 2014-06-17 ENCOUNTER — Ambulatory Visit: Payer: Medicaid Other

## 2014-06-17 ENCOUNTER — Encounter: Payer: Self-pay | Admitting: Radiation Oncology

## 2014-06-17 DIAGNOSIS — C163 Malignant neoplasm of pyloric antrum: Secondary | ICD-10-CM | POA: Diagnosis not present

## 2014-06-17 NOTE — Progress Notes (Addendum)
  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 By completion of therapy she coped with reflux, nausea.  Plan: The patient has completed radiation treatment. The patient will return to radiation oncology clinic for routine followup in one half month. I advised them to call or return sooner if they have any questions or concerns related to their recovery or treatment.  -----------------------------------  Eppie Gibson, MD

## 2014-06-18 ENCOUNTER — Other Ambulatory Visit: Payer: Self-pay

## 2014-06-18 ENCOUNTER — Telehealth: Payer: Self-pay | Admitting: Internal Medicine

## 2014-06-18 ENCOUNTER — Ambulatory Visit: Payer: Self-pay | Admitting: Physician Assistant

## 2014-06-18 NOTE — Telephone Encounter (Signed)
pt called she was not feeling well and wants to r/s....done...pt aware of new d.t

## 2014-06-19 ENCOUNTER — Other Ambulatory Visit: Payer: Self-pay

## 2014-06-19 ENCOUNTER — Emergency Department (HOSPITAL_BASED_OUTPATIENT_CLINIC_OR_DEPARTMENT_OTHER)
Admission: EM | Admit: 2014-06-19 | Discharge: 2014-06-19 | Disposition: A | Discharge status: Home / Self Care | Payer: Medicaid Other | Attending: Emergency Medicine | Admitting: Emergency Medicine

## 2014-06-19 DIAGNOSIS — C169 Malignant neoplasm of stomach, unspecified: Secondary | ICD-10-CM | POA: Insufficient documentation

## 2014-06-19 DIAGNOSIS — K219 Gastro-esophageal reflux disease without esophagitis: Secondary | ICD-10-CM | POA: Insufficient documentation

## 2014-06-19 DIAGNOSIS — Z79899 Other long term (current) drug therapy: Secondary | ICD-10-CM | POA: Insufficient documentation

## 2014-06-19 DIAGNOSIS — G43909 Migraine, unspecified, not intractable, without status migrainosus: Secondary | ICD-10-CM | POA: Insufficient documentation

## 2014-06-19 DIAGNOSIS — Z8739 Personal history of other diseases of the musculoskeletal system and connective tissue: Secondary | ICD-10-CM | POA: Insufficient documentation

## 2014-06-19 DIAGNOSIS — I10 Essential (primary) hypertension: Secondary | ICD-10-CM | POA: Insufficient documentation

## 2014-06-19 DIAGNOSIS — Z87891 Personal history of nicotine dependence: Secondary | ICD-10-CM | POA: Diagnosis not present

## 2014-06-19 DIAGNOSIS — R112 Nausea with vomiting, unspecified: Secondary | ICD-10-CM | POA: Diagnosis present

## 2014-06-19 DIAGNOSIS — Z8742 Personal history of other diseases of the female genital tract: Secondary | ICD-10-CM | POA: Insufficient documentation

## 2014-06-19 DIAGNOSIS — R011 Cardiac murmur, unspecified: Secondary | ICD-10-CM | POA: Insufficient documentation

## 2014-06-19 DIAGNOSIS — Z85028 Personal history of other malignant neoplasm of stomach: Secondary | ICD-10-CM

## 2014-06-19 LAB — COMPREHENSIVE METABOLIC PANEL
ALBUMIN: 4.7 g/dL (ref 3.5–5.2)
ALK PHOS: 65 U/L (ref 39–117)
ALT: 13 U/L (ref 0–35)
AST: 19 U/L (ref 0–37)
Anion gap: 8 (ref 5–15)
BUN: 10 mg/dL (ref 6–23)
CALCIUM: 9.3 mg/dL (ref 8.4–10.5)
CO2: 25 mmol/L (ref 19–32)
Chloride: 103 mmol/L (ref 96–112)
Creatinine, Ser: 0.7 mg/dL (ref 0.50–1.10)
GFR calc Af Amer: >90 mL/min (ref >90)
GFR calc non Af Amer: >90 mL/min (ref >90)
Glucose, Bld: 137 mg/dL — ABNORMAL HIGH (ref 70–99)
POTASSIUM: 4 mmol/L (ref 3.5–5.1)
SODIUM: 136 mmol/L (ref 135–145)
Total Bilirubin: 0.6 mg/dL (ref 0.3–1.2)
Total Protein: 7.9 g/dL (ref 6.0–8.3)

## 2014-06-19 LAB — CBC WITH DIFFERENTIAL/PLATELET
Basophils Absolute: 0 10*3/uL (ref 0.0–0.1)
Basophils Relative: 0 % (ref 0–1)
EOS ABS: 0 10*3/uL (ref 0.0–0.7)
EOS PCT: 0 % (ref 0–5)
HCT: 38 % (ref 36.0–46.0)
HEMOGLOBIN: 12.6 g/dL (ref 12.0–15.0)
LYMPHS ABS: 0.1 10*3/uL — AB (ref 0.7–4.0)
Lymphocytes Relative: 2 % — ABNORMAL LOW (ref 12–46)
MCH: 28.3 pg (ref 26.0–34.0)
MCHC: 33.2 g/dL (ref 30.0–36.0)
MCV: 85.2 fL (ref 78.0–100.0)
MONOS PCT: 9 % (ref 3–12)
Monocytes Absolute: 0.6 10*3/uL (ref 0.1–1.0)
Neutro Abs: 6.1 10*3/uL (ref 1.7–7.7)
Neutrophils Relative %: 89 % — ABNORMAL HIGH (ref 43–77)
PLATELETS: 194 10*3/uL (ref 150–400)
RBC: 4.46 MIL/uL (ref 3.87–5.11)
RDW: 12.8 % (ref 11.5–15.5)
WBC: 6.9 10*3/uL (ref 4.0–10.5)

## 2014-06-19 LAB — LIPASE, BLOOD: Lipase: 17 U/L (ref 11–59)

## 2014-06-19 LAB — TROPONIN I

## 2014-06-19 MED ORDER — ONDANSETRON HCL 4 MG/2ML IJ SOLN
4.0000 mg | Freq: Once | INTRAMUSCULAR | Status: AC
  Administered 2014-06-19: 4 mg via INTRAVENOUS
  Filled 2014-06-19: qty 2

## 2014-06-19 MED ORDER — ONDANSETRON 8 MG PO TBDP: ORAL_TABLET | ORAL | Status: DC

## 2014-06-19 MED ORDER — SODIUM CHLORIDE 0.9 % IV BOLUS (SEPSIS)
1000.0000 mL | Freq: Once | INTRAVENOUS | Status: AC
  Administered 2014-06-19: 1000 mL via INTRAVENOUS

## 2014-06-19 MED ORDER — HYDROMORPHONE HCL 1 MG/ML IJ SOLN
1.0000 mg | Freq: Once | INTRAMUSCULAR | Status: AC
  Administered 2014-06-19: 1 mg via INTRAVENOUS
  Filled 2014-06-19: qty 1

## 2014-06-19 NOTE — Discharge Instructions (Signed)
Zofran as prescribed as needed for nausea.  Follow-up with your oncologist tomorrow as scheduled.   Nausea and Vomiting Nausea means you feel sick to your stomach. Throwing up (vomiting) is a reflex where stomach contents come out of your mouth. HOME CARE   Take medicine as told by your doctor.  Do not force yourself to eat. However, you do need to drink fluids.  If you feel like eating, eat a normal diet as told by your doctor.  Eat rice, wheat, potatoes, bread, lean meats, yogurt, fruits, and vegetables.  Avoid high-fat foods.  Drink enough fluids to keep your pee (urine) clear or pale yellow.  Ask your doctor how to replace body fluid losses (rehydrate). Signs of body fluid loss (dehydration) include:  Feeling very thirsty.  Dry lips and mouth.  Feeling dizzy.  Dark pee.  Peeing less than normal.  Feeling confused.  Fast breathing or heart rate. GET HELP RIGHT AWAY IF:   You have blood in your throw up.  You have black or bloody poop (stool).  You have a bad headache or stiff neck.  You feel confused.  You have bad belly (abdominal) pain.  You have chest pain or trouble breathing.  You do not pee at least once every 8 hours.  You have cold, clammy skin.  You keep throwing up after 24 to 48 hours.  You have a fever. MAKE SURE YOU:   Understand these instructions.  Will watch your condition.  Will get help right away if you are not doing well or get worse. Document Released: 07/27/2007 Document Revised: 05/02/2011 Document Reviewed: 07/09/2010 [facility] Patient Information 2015 [place], [place]. This information is not intended to replace advice given to you by your health care provider. Make sure you discuss any questions you have with your health care provider.

## 2014-06-19 NOTE — ED Notes (Signed)
Pt declines port access, requests peripheral iv access.

## 2014-06-19 NOTE — ED Notes (Signed)
Pt to room 6 in w/c, able to stand and walk to bed. Pt reports having radiation on Tuesday, and has had emesis since then. Her oncologist told her take her zofran, has apt tomorrow, but pt states she is unable to retain any po. Pt states "I just need some dilaudid to make my pain go away, then the vomiting will stop."

## 2014-06-19 NOTE — ED Provider Notes (Signed)
CSN: [id number]     Arrival date & time 06/19/14  0804 History   First MD Initiated Contact with Patient 06/19/14 [phone]     Chief Complaint  Patient presents with  . Emesis     (Consider location/radiation/quality/duration/timing/severity/associated sxs/prior Treatment) HPI Comments: Patient is a 49 year old female with history of stomach cancer. She has had chemotherapy in the past and 3 days ago completed radiation. She states for the past 3 days she has been unable to eat or drink. She denies any bloody stool or vomit. She has had some diarrhea. In the past Dilaudid and Zofran have resolved her symptoms.  Patient is a 49 y.o. female presenting with vomiting. The history is provided by the patient.  Emesis Severity:  Moderate Duration:  3 days Timing:  Constant Chronicity:  New Recent urination:  Decreased Relieved by:  Nothing Worsened by:  Nothing tried Ineffective treatments:  None tried Associated symptoms: abdominal pain   Associated symptoms: no chills and no fever     Past Medical History  Diagnosis Date  . Headache(784.0) 2011    Migraines  . Esophageal reflux     On omeprazole  . Costochondritis   . Hypertension     started around 2011  . Ovarian cyst   . Stomach cancer   . Family history of cancer   . Heart murmur   . On antineoplastic chemotherapy     5-FU and leucovorin   Past Surgical History  Procedure Laterality Date  . Oophorectomy      Around 1985 left ovary removal  . Tonsillectomy      Around 1994  . Tubal ligation    . Esophagogastroduodenoscopy N/A 09/16/2013    Procedure: ESOPHAGOGASTRODUODENOSCOPY (EGD);  Surgeon: Wonda Horner, MD;  Location: [facility];  Service: Endoscopy;  Laterality: N/A;  . Laparoscopy N/A 09/20/2013    Procedure: DIAGNOSTIC LAPAROSCOPY,DISTAL GASTRECTOMY AND FEEDING GASTROJEJUNOSTOMY;  Surgeon: [name], MD;  Location: [facility];  Service: General;  Laterality: N/A;  . Portacath placement Left 11/18/2013   Procedure: INSERTION PORT-A-CATH;  Surgeon: [name], MD;  Location: [facility];  Service: General;  Laterality: Left;   Family History  Problem Relation Age of Onset  . Hypertension Mother   . Diabetes Mellitus II Mother   . Hypertension Sister   . Hypertension Brother   . Heart failure Maternal Grandmother   . Hypertension Maternal Grandfather   . Prostate cancer Paternal Uncle     living at 2  . Gastric cancer Cousin 58    pat first cousin; daughter of pat uncle living with prostate ca   . Gastric cancer Cousin     mat first cousin; daughter of mat aunt who died in 11s due to heart issues  . Prostate cancer Paternal Uncle     deceased  . Prostate cancer Paternal Uncle     deceased  . Lung cancer Paternal Uncle     smoker; deceased   History  Substance Use Topics  . Smoking status: Former Research scientist (life sciences)  . Smokeless tobacco: Never Used     Comment: Stopped 2004  . Alcohol Use: No   OB History    No data available     Review of Systems  Constitutional: Negative for chills.  Gastrointestinal: Positive for vomiting and abdominal pain.  All other systems reviewed and are negative.     Allergies  Review of patient's allergies indicates no known allergies.  Home Medications   Prior to Admission medications   Medication Sig  Start Date End Date Taking? Authorizing Provider  capecitabine (XELODA) 500 MG tablet Take 2 tablets (1,000 mg total) by mouth 2 (two) times daily after a meal. Patient not taking: Reported on 06/09/2014 05/12/14   Curt Bears, MD  carvedilol (COREG) 6.25 MG tablet Take 6.25 mg by mouth 2 (two) times daily with a meal.    Historical Provider, MD  hyaluronate sodium (RADIAPLEXRX) GEL Apply 1 application topically 2 (two) times daily.    Historical Provider, MD  lactobacillus acidophilus (BACID) TABS tablet Take 2 tablets by mouth 3 (three) times daily.    Historical Provider, MD  lidocaine-prilocaine (EMLA) cream Apply 1 application topically as needed.  Apply to port 1 hour before chemo 11/13/13   Curt Bears, MD  LORazepam (ATIVAN) 1 MG tablet Take 1 tablet (1 mg total) by mouth every 8 (eight) hours as needed for anxiety. 06/03/14   Curt Bears, MD  metoCLOPramide (REGLAN) 10 MG tablet Take 1 tablet (10 mg total) by mouth 2 (two) times daily. Then, stop taking this medication. 06/16/14   Eppie Gibson, MD  Multiple Vitamins-Minerals (MULTIVITAMIN & MINERAL PO) Take 1 tablet by mouth daily.    Historical Provider, MD  Nutritional Supplements (ENSURE COMPLETE PO) Take 237 mLs by mouth daily. Takes a little at a time, in order to keep it down.    Historical Provider, MD  ondansetron (ZOFRAN-ODT) 8 MG disintegrating tablet Take 1 tablet (8 mg total) by mouth every 8 (eight) hours as needed for nausea or vomiting. 06/16/14   Eppie Gibson, MD  [place]     Historical Provider, MD  oxyCODONE (OXY IR/ROXICODONE) 5 MG immediate release tablet Take 1 tablet (5 mg total) by mouth every 4 (four) hours as needed for severe pain. Patient not taking: Reported on 06/09/2014 02/17/14   Maryanna Shape, NP  pantoprazole (PROTONIX) 40 MG tablet Take 1 tablet (40 mg total) by mouth 2 (two) times daily before a meal. 01/10/14   Curt Bears, MD   BP 158/92 mmHg  Pulse 94  Temp(Src) 98.7 F (37.1 C) (Oral)  Resp 18  Ht 5' (1.524 m)  Wt 124 lb (56.246 kg)  BMI 24.22 kg/m2  SpO2 100%  LMP 05/30/2014 Physical Exam  Constitutional: She is oriented to person, place, and time. She appears well-developed and well-nourished. No distress.  HENT:  Head: Normocephalic and atraumatic.  Mouth/Throat: Oropharynx is clear and moist.  Neck: Normal range of motion. Neck supple.  Cardiovascular: Normal rate and regular rhythm.  Exam reveals no gallop and no friction rub.   No murmur heard. Pulmonary/Chest: Effort normal and breath sounds normal. No respiratory distress. She has no wheezes.  Abdominal: Soft. Bowel sounds are normal. She exhibits no  distension. There is tenderness. There is no rebound and no guarding.  There is mild tenderness to palpation across the upper abdomen.  Musculoskeletal: Normal range of motion.  Neurological: She is alert and oriented to person, place, and time.  Skin: Skin is warm and dry. She is not diaphoretic.  Nursing note and vitals reviewed.   ED Course  Procedures (including critical care time) Labs Review Labs Reviewed  COMPREHENSIVE METABOLIC PANEL  CBC WITH DIFFERENTIAL/PLATELET  LIPASE, BLOOD    Imaging Review No results found.   EKG Interpretation   Date/Time:  Thursday June 19 2014 09:27:57 EDT Ventricular Rate:  80 PR Interval:  104 QRS Duration: 82 QT Interval:  378 QTC Calculation: 435 R Axis:   61 Text Interpretation:  Sinus  rhythm with sinus arrhythmia with short PR  Moderate voltage criteria for LVH, may be normal variant T wave  abnormality, consider inferior ischemia Abnormal ECG Confirmed by DELOS   MD, Tippi Mccrae ([id number]) on 06/19/2014 9:35:34 AM      MDM   Final diagnoses:  None    Patient is a 49 year old female with history of stomach cancer. She presents for evaluation of chest and abdominal discomfort and associated vomiting. She's had these episodes before during radiation. She states she is normally given Dilaudid and nausea medication. This was given in the ER and she is now resting comfortably. There has been no further vomiting and is tolerating by mouth. She has an appointment tomorrow with her oncologist and I will advise her to follow-up with him. I will write for Zofran she can use for nausea.    Veryl Speak, MD 06/19/14 1006

## 2014-06-20 ENCOUNTER — Ambulatory Visit (HOSPITAL_BASED_OUTPATIENT_CLINIC_OR_DEPARTMENT_OTHER): Payer: Medicaid Other | Admitting: Physician Assistant

## 2014-06-20 ENCOUNTER — Emergency Department (HOSPITAL_COMMUNITY)
Admission: EM | Admit: 2014-06-20 | Discharge: 2014-06-20 | Disposition: A | Discharge status: Home / Self Care | Payer: Medicaid Other | Attending: Emergency Medicine | Admitting: Emergency Medicine

## 2014-06-20 ENCOUNTER — Other Ambulatory Visit (HOSPITAL_BASED_OUTPATIENT_CLINIC_OR_DEPARTMENT_OTHER): Payer: Medicaid Other

## 2014-06-20 ENCOUNTER — Encounter (HOSPITAL_COMMUNITY): Payer: Self-pay | Admitting: Emergency Medicine

## 2014-06-20 ENCOUNTER — Encounter: Payer: Self-pay | Admitting: Physician Assistant

## 2014-06-20 ENCOUNTER — Emergency Department (HOSPITAL_COMMUNITY): Payer: Medicaid Other

## 2014-06-20 VITALS — BP 172/93 | HR 95 | Temp 99.0°F | Resp 20 | Ht 60.0 in | Wt 120.7 lb

## 2014-06-20 DIAGNOSIS — R1084 Generalized abdominal pain: Secondary | ICD-10-CM | POA: Diagnosis not present

## 2014-06-20 DIAGNOSIS — R52 Pain, unspecified: Secondary | ICD-10-CM | POA: Diagnosis not present

## 2014-06-20 DIAGNOSIS — R1110 Vomiting, unspecified: Secondary | ICD-10-CM | POA: Diagnosis not present

## 2014-06-20 DIAGNOSIS — C779 Secondary and unspecified malignant neoplasm of lymph node, unspecified: Secondary | ICD-10-CM | POA: Diagnosis not present

## 2014-06-20 DIAGNOSIS — C169 Malignant neoplasm of stomach, unspecified: Secondary | ICD-10-CM | POA: Diagnosis not present

## 2014-06-20 DIAGNOSIS — R42 Dizziness and giddiness: Secondary | ICD-10-CM

## 2014-06-20 DIAGNOSIS — K219 Gastro-esophageal reflux disease without esophagitis: Secondary | ICD-10-CM | POA: Insufficient documentation

## 2014-06-20 DIAGNOSIS — Z3202 Encounter for pregnancy test, result negative: Secondary | ICD-10-CM | POA: Diagnosis not present

## 2014-06-20 DIAGNOSIS — I10 Essential (primary) hypertension: Secondary | ICD-10-CM

## 2014-06-20 DIAGNOSIS — R011 Cardiac murmur, unspecified: Secondary | ICD-10-CM | POA: Insufficient documentation

## 2014-06-20 DIAGNOSIS — Z79899 Other long term (current) drug therapy: Secondary | ICD-10-CM | POA: Insufficient documentation

## 2014-06-20 DIAGNOSIS — Z85028 Personal history of other malignant neoplasm of stomach: Secondary | ICD-10-CM | POA: Diagnosis not present

## 2014-06-20 DIAGNOSIS — Z87891 Personal history of nicotine dependence: Secondary | ICD-10-CM | POA: Diagnosis not present

## 2014-06-20 DIAGNOSIS — Z8742 Personal history of other diseases of the female genital tract: Secondary | ICD-10-CM | POA: Insufficient documentation

## 2014-06-20 DIAGNOSIS — R112 Nausea with vomiting, unspecified: Secondary | ICD-10-CM

## 2014-06-20 DIAGNOSIS — Z8739 Personal history of other diseases of the musculoskeletal system and connective tissue: Secondary | ICD-10-CM | POA: Diagnosis not present

## 2014-06-20 LAB — COMPREHENSIVE METABOLIC PANEL
ALBUMIN: 4.6 g/dL (ref 3.5–5.2)
ALK PHOS: 67 U/L (ref 39–117)
ALT: 16 U/L (ref 0–35)
ANION GAP: 14 (ref 5–15)
AST: 30 U/L (ref 0–37)
BILIRUBIN TOTAL: 1.1 mg/dL (ref 0.3–1.2)
BUN: 11 mg/dL (ref 6–23)
CO2: 24 mmol/L (ref 19–32)
Calcium: 9.3 mg/dL (ref 8.4–10.5)
Chloride: 98 mmol/L (ref 96–112)
Creatinine, Ser: 0.7 mg/dL (ref 0.50–1.10)
GFR calc Af Amer: >90 mL/min (ref >90)
GFR calc non Af Amer: >90 mL/min (ref >90)
GLUCOSE: 94 mg/dL (ref 70–99)
POTASSIUM: 4.5 mmol/L (ref 3.5–5.1)
Sodium: 136 mmol/L (ref 135–145)
Total Protein: 8.2 g/dL (ref 6.0–8.3)

## 2014-06-20 LAB — CBC WITH DIFFERENTIAL/PLATELET
BASO%: 0.2 % (ref 0.0–2.0)
BASOS PCT: 0 % (ref 0–1)
Basophils Absolute: 0 10*3/uL (ref 0.0–0.1)
Basophils Absolute: 0 10*3/uL (ref 0.0–0.1)
EOS PCT: 0 % (ref 0–5)
EOS%: 0.2 % (ref 0.0–7.0)
Eosinophils Absolute: 0 10*3/uL (ref 0.0–0.5)
Eosinophils Absolute: 0 10*3/uL (ref 0.0–0.7)
HCT: 42 % (ref 34.8–46.6)
HCT: 40.7 % (ref 36.0–46.0)
HEMOGLOBIN: 13.1 g/dL (ref 12.0–15.0)
HGB: 13.5 g/dL (ref 11.6–15.9)
LYMPH%: 2.1 % — ABNORMAL LOW (ref 14.0–49.7)
LYMPHS ABS: 0.4 10*3/uL — AB (ref 0.7–4.0)
LYMPHS PCT: 8 % — AB (ref 12–46)
MCH: 27.6 pg (ref 25.1–34.0)
MCH: 27.6 pg (ref 26.0–34.0)
MCHC: 32.2 g/dL (ref 31.5–36.0)
MCHC: 32.2 g/dL (ref 30.0–36.0)
MCV: 85.8 fL (ref 79.5–101.0)
MCV: 85.7 fL (ref 78.0–100.0)
MONO#: 0.6 10*3/uL (ref 0.1–0.9)
MONO%: 11.1 % (ref 0.0–14.0)
Monocytes Absolute: 0.5 10*3/uL (ref 0.1–1.0)
Monocytes Relative: 9 % (ref 3–12)
NEUT%: 86.4 % — ABNORMAL HIGH (ref 38.4–76.8)
NEUTROS ABS: 5 10*3/uL (ref 1.5–6.5)
NEUTROS PCT: 83 % — AB (ref 43–77)
Neutro Abs: 4.1 10*3/uL (ref 1.7–7.7)
PLATELETS: 137 10*3/uL — AB (ref 150–400)
Platelets: 184 10*3/uL (ref 145–400)
RBC: 4.89 10*6/uL (ref 3.70–5.45)
RBC: 4.75 MIL/uL (ref 3.87–5.11)
RDW: 13.5 % (ref 11.2–14.5)
RDW: 12.9 % (ref 11.5–15.5)
WBC: 5.8 10*3/uL (ref 3.9–10.3)
WBC: 5 10*3/uL (ref 4.0–10.5)
lymph#: 0.1 10*3/uL — ABNORMAL LOW (ref 0.9–3.3)

## 2014-06-20 LAB — URINALYSIS, ROUTINE W REFLEX MICROSCOPIC
Bilirubin Urine: NEGATIVE
GLUCOSE, UA: NEGATIVE mg/dL
Hgb urine dipstick: NEGATIVE
Ketones, ur: >80 mg/dL — AB
LEUKOCYTES UA: NEGATIVE
NITRITE: NEGATIVE
Protein, ur: NEGATIVE mg/dL
SPECIFIC GRAVITY, URINE: 1.017 (ref 1.005–1.030)
Urobilinogen, UA: 0.2 mg/dL (ref 0.0–1.0)
pH: 6 (ref 5.0–8.0)

## 2014-06-20 LAB — COMPREHENSIVE METABOLIC PANEL (CC13)
ALBUMIN: 4.4 g/dL (ref 3.5–5.0)
ALT: 13 U/L (ref 0–55)
AST: 18 U/L (ref 5–34)
Alkaline Phosphatase: 76 U/L (ref 40–150)
Anion Gap: 13 mEq/L — ABNORMAL HIGH (ref 3–11)
BUN: 10.5 mg/dL (ref 7.0–26.0)
CALCIUM: 9.6 mg/dL (ref 8.4–10.4)
CO2: 24 mEq/L (ref 22–29)
Chloride: 98 mEq/L (ref 98–109)
Creatinine: 0.8 mg/dL (ref 0.6–1.1)
Glucose: 100 mg/dl (ref 70–140)
Potassium: 3.9 mEq/L (ref 3.5–5.1)
Sodium: 135 mEq/L — ABNORMAL LOW (ref 136–145)
Total Bilirubin: 0.79 mg/dL (ref 0.20–1.20)
Total Protein: 8.1 g/dL (ref 6.4–8.3)

## 2014-06-20 LAB — I-STAT CG4 LACTIC ACID, ED: LACTIC ACID, VENOUS: 1.36 mmol/L (ref 0.5–2.0)

## 2014-06-20 LAB — LIPASE, BLOOD: Lipase: 13 U/L (ref 11–59)

## 2014-06-20 LAB — POC URINE PREG, ED: PREG TEST UR: NEGATIVE

## 2014-06-20 MED ORDER — METOCLOPRAMIDE HCL 5 MG/ML IJ SOLN
10.0000 mg | Freq: Once | INTRAMUSCULAR | Status: AC
  Administered 2014-06-20: 10 mg via INTRAVENOUS
  Filled 2014-06-20: qty 2

## 2014-06-20 MED ORDER — IOHEXOL 300 MG/ML  SOLN
100.0000 mL | Freq: Once | INTRAMUSCULAR | Status: AC | PRN
  Administered 2014-06-20: 100 mL via INTRAVENOUS

## 2014-06-20 MED ORDER — HYDROMORPHONE HCL 1 MG/ML IJ SOLN
1.0000 mg | Freq: Once | INTRAMUSCULAR | Status: AC
  Administered 2014-06-20: 1 mg via INTRAVENOUS
  Filled 2014-06-20: qty 1

## 2014-06-20 MED ORDER — ONDANSETRON HCL 4 MG/2ML IJ SOLN
4.0000 mg | Freq: Once | INTRAMUSCULAR | Status: AC
  Administered 2014-06-20: 4 mg via INTRAVENOUS
  Filled 2014-06-20: qty 2

## 2014-06-20 MED ORDER — OXYCODONE-ACETAMINOPHEN 5-325 MG PO TABS: 1.0000 | ORAL_TABLET | Freq: Four times a day (QID) | ORAL | Status: DC | PRN

## 2014-06-20 MED ORDER — SODIUM CHLORIDE 0.9 % IV BOLUS (SEPSIS)
1000.0000 mL | Freq: Once | INTRAVENOUS | Status: AC
  Administered 2014-06-20: 1000 mL via INTRAVENOUS

## 2014-06-20 NOTE — Progress Notes (Signed)
[place] [phone]   [phone]  OFFICE PROGRESS NOTE  Arnoldo Morale, MD [place] [address]  DIAGNOSIS: Stage IIA (T1a, N2, M0) gastric adenocarcinoma diagnosed in July of 2015  PRIOR THERAPY: 1) Status post diagnostic laparoscopy, distal gastrectomy with billroth II anastamosis and feeding gastrojejunostomy tube under the care of Dr. Barry Dienes on 09/20/2013. 2) Adjuvant systemic chemotherapy with 5-FU 600 mg/M2 with leucovorin on days 1, 8, 15, 22, 29 and 36 every 8 weeks. First dose 11/19/2013.   CURRENT THERAPY: Adjuvant concurrent chemoradiation with Xeloda 650 MG/M2 by mouth twice a day for 5 days every week with radiation. She is expected to start the first dose of this treatment today.  INTERVAL HISTORY: Paula [name] 49 y.o. female returns to the clinic today for followup visit. She completed the first 2 cycles of adjuvant chemotherapy with 5-FU and leucovorin status post 6 weeks of treatment and tolerating her treatment fairly well.  She was found on previous CT scan of the abdomen to have gastric wall thickening. The patient was referred to Dr. Penelope Coop and underwent upper endoscopy with biopsy that showed no evidence for disease recurrence. She recently completed her course of radiation therapy but did not take the oral Xeloda concurrent with radiation therapy. She reports not tolerating his Xeloda and presents wondering what her other treatment options might be. She complains of back and rib pain as well as nausea vomiting and lightheadedness. [facility] and per the patient given some IV fluids as well as a prescription for 60 tablets of 8 mg Zofran. A note for this visit was unavailable time of dictation. She is followed at the [facility] for her hypertension but reports not taking her Coreg for the past month or so. She reports that she ran out and has not been seen  recently by her primary care provider. She's not been eating or drinking very well. She had will occasionally drink a Boost/Ensure type products. She denied having any significant fever or chills, no nausea or vomiting. She denied having any significant shortness of breath or hemoptysis. She has lost about 12 pounds since last being seen in our office on 05/12/2014. She denied night sweats. She is here today to begin the conversation regarding her treatment options.   MEDICAL HISTORY: Past Medical History  Diagnosis Date  . Headache(784.0) 2011    Migraines  . Esophageal reflux     On omeprazole  . Costochondritis   . Hypertension     started around 2011  . Ovarian cyst   . Stomach cancer   . Family history of cancer   . Heart murmur   . On antineoplastic chemotherapy     5-FU and leucovorin    ALLERGIES:  has No Known Allergies.  MEDICATIONS:  Current Outpatient Prescriptions  Medication Sig Dispense Refill  . capecitabine (XELODA) 500 MG tablet Take 2 tablets (1,000 mg total) by mouth 2 (two) times daily after a meal. (Patient not taking: Reported on 06/09/2014) 120 tablet 1  . carvedilol (COREG) 6.25 MG tablet Take 6.25 mg by mouth 2 (two) times daily with a meal.    . hyaluronate sodium (RADIAPLEXRX) GEL Apply 1 application topically 2 (two) times daily.    Marland Kitchen lactobacillus acidophilus (BACID) TABS tablet Take 2 tablets by mouth 3 (three) times daily.    Marland Kitchen lidocaine-prilocaine (EMLA) cream Apply 1 application topically as needed.  Apply to port 1 hour before chemo 30 g 0  . LORazepam (ATIVAN) 1 MG tablet Take 1 tablet (1 mg total) by mouth every 8 (eight) hours as needed for anxiety. 30 tablet 0  . metoCLOPramide (REGLAN) 10 MG tablet Take 1 tablet (10 mg total) by mouth 2 (two) times daily. Then, stop taking this medication. 60 tablet 0  . Multiple Vitamins-Minerals (MULTIVITAMIN & MINERAL PO) Take 1 tablet by mouth daily.    . Nutritional Supplements (ENSURE COMPLETE PO) Take 237  mLs by mouth daily. Takes a little at a time, in order to keep it down.    . ondansetron (ZOFRAN ODT) 8 MG disintegrating tablet 8mg  ODT q4 hours prn nausea 6 tablet 0  . OVER THE COUNTER MEDICATION     . oxyCODONE (OXY IR/ROXICODONE) 5 MG immediate release tablet Take 1 tablet (5 mg total) by mouth every 4 (four) hours as needed for severe pain. (Patient not taking: Reported on 06/09/2014) 45 tablet 0  . pantoprazole (PROTONIX) 40 MG tablet Take 1 tablet (40 mg total) by mouth 2 (two) times daily before a meal. 60 tablet 3   No current facility-administered medications for this visit.    SURGICAL HISTORY:  Past Surgical History  Procedure Laterality Date  . Oophorectomy      Around 1985 left ovary removal  . Tonsillectomy      Around 1994  . Tubal ligation    . Esophagogastroduodenoscopy N/A 09/16/2013    Procedure: ESOPHAGOGASTRODUODENOSCOPY (EGD);  Surgeon: Wonda Horner, MD;  Location: [facility];  Service: Endoscopy;  Laterality: N/A;  . Laparoscopy N/A 09/20/2013    Procedure: DIAGNOSTIC LAPAROSCOPY,DISTAL GASTRECTOMY AND FEEDING GASTROJEJUNOSTOMY;  Surgeon: [name], MD;  Location: [facility];  Service: General;  Laterality: N/A;  . Portacath placement Left 11/18/2013    Procedure: INSERTION PORT-A-CATH;  Surgeon: [name], MD;  Location: [facility];  Service: General;  Laterality: Left;    REVIEW OF SYSTEMS:  Constitutional: positive for anorexia, fatigue, malaise and weight loss Eyes: negative Ears, nose, mouth, throat, and face: negative Respiratory: negative Cardiovascular: positive for uncontrolled hypertension Gastrointestinal: positive for abdominal pain, nausea and vomiting Genitourinary:negative Integument/breast: negative Hematologic/lymphatic: negative Musculoskeletal:positive for back pain and rib pain Neurological: negative Behavioral/Psych: negative Endocrine: negative Allergic/Immunologic: negative   PHYSICAL EXAMINATION: General appearance: alert,  cooperative, distracted, fatigued and mild distress Head: Normocephalic, without obvious abnormality, atraumatic Neck: no adenopathy, no JVD, supple, symmetrical, trachea midline and thyroid not enlarged, symmetric, no tenderness/mass/nodules Lymph nodes: Cervical, supraclavicular, and axillary nodes normal. Resp: clear to auscultation bilaterally Back: symmetric, no curvature. ROM normal. No CVA tenderness. Cardio: regular rate and rhythm, S1, S2 normal, no murmur, click, rub or gallop GI: soft, mild generalized tenderness, no rebound or referred pain Extremities: extremities normal, atraumatic, no cyanosis or edema Neurologic: Grossly normal  ECOG PERFORMANCE STATUS: 1 - Symptomatic but completely ambulatory  Blood pressure 172/93, pulse 95, temperature 99 F (37.2 C), temperature source Oral, resp. rate 20, height 5' (1.524 m), weight 120 lb 11.2 oz (54.749 kg), last menstrual period 05/30/2014, SpO2 100 %.  LABORATORY DATA: Lab Results  Component Value Date   WBC 5.8 06/20/2014   HGB 13.5 06/20/2014   HCT 42.0 06/20/2014   MCV 85.8 06/20/2014   PLT 184 06/20/2014      Chemistry      Component Value Date/Time   NA 135* 06/20/2014 1422   NA 136 06/19/2014 0829   K 3.9 06/20/2014 1422   K 4.0 06/19/2014 0829  CL 103 06/19/2014 0829   CO2 24 06/20/2014 1422   CO2 25 06/19/2014 0829   BUN 10.5 06/20/2014 1422   BUN 10 06/19/2014 0829   CREATININE 0.8 06/20/2014 1422   CREATININE 0.70 06/19/2014 0829      Component Value Date/Time   CALCIUM 9.6 06/20/2014 1422   CALCIUM 9.3 06/19/2014 0829   ALKPHOS 76 06/20/2014 1422   ALKPHOS 65 06/19/2014 0829   AST 18 06/20/2014 1422   AST 19 06/19/2014 0829   ALT 13 06/20/2014 1422   ALT 13 06/19/2014 0829   BILITOT 0.79 06/20/2014 1422   BILITOT 0.6 06/19/2014 0829       RADIOGRAPHIC STUDIES:  ASSESSMENT AND PLAN: this is a very pleasant 49 years old African American female recently diagnosed with a stage IIA gastric  adenocarcinoma status post resection with lymph node dissection. She completed the first 2 cycles adjuvant chemotherapy with 5-FU and leucovorin and tolerated her treatment fairly well except for neutropenia and mild nausea. The patient was found on the recent scan to have thickening in the gastric wall and repeat upper endoscopy and biopsy showed no evidence for disease recurrence.  Dr. [name] recommended for the patient to proceed with a course of concurrent chemoradiation with Xeloda 650 MG/M2 twice a day 5 days a week during her radiation. She recently completed her course of radiation but reports she did not take the oral Xeloda secondary to intolerance. I discussed with her that her other option would be IV chemotherapy with 5FU and leucovorin as she had prior to her course of radiation. This will be further discussed with Dr. [name]. Today the patient was reviewed with Dr. Benay Spice ( on call today) and the recommendation was for the patient to be seen and evaluated/managed in the [facility] Emergency Department for he uncontrolled hypertension, nausea, vomiting, light headedness and pain.  She would come back for follow-up visit in 1-2 weeks to see Dr. [name] for reevaluation and discussion of the remaining part of her adjuvant therapy. She was advised to call immediately if she has any concerning symptoms in the interval.  The patient voices understanding of current disease status and treatment options and is in agreement with the current care plan.  All questions were answered. The patient knows to call the clinic with any problems, questions or concerns. We can certainly see the patient much sooner if necessary.  Carlton Adam, PA-C 06/20/2014   Disclaimer: This note was dictated with voice recognition software. Similar sounding words can inadvertently be transcribed and may not be corrected upon review.

## 2014-06-20 NOTE — Progress Notes (Signed)
Received call from Jeneen Rinks of the paging service. James relayed that Dr. Tammi Klippel has been requested to call Paula [name] at [facility] Emergency Dept. She reports this patient presented to the ED today with pain and nausea. She explains the patient is giving push back about a diagnostic CT because she believes she had one in March. Explained a CT simulation was done 05/05/2014 in the radiation oncology department for mapping purpose and is NOT diagnostic. Erin verbalized understanding and expressed appreciation for the return call.

## 2014-06-20 NOTE — ED Notes (Signed)
Pt reports emesis since Monday; pt reports 2 emesis episode last 24 hours.

## 2014-06-20 NOTE — ED Provider Notes (Signed)
CSN: [id number]     Arrival date & time 06/20/14  1528 History   First MD Initiated Contact with Patient 06/20/14 1606     Chief Complaint  Patient presents with  . Emesis     (Consider location/radiation/quality/duration/timing/severity/associated sxs/prior Treatment) HPI  Pt is a 49yo female with hx of stomach cancer, completed her radiation on 4/26, sent to ED by cancer center for further evaluation and management of persistent abdominal pain, nausea and vomiting.  Pt was seen in ED on 06/19/14 for same, tx with dilaudid and zofran. Symptoms improved at that time, and pt was able to be discharged home to f/u with oncology today.  Pt reports vomiting twice today. No blood or bile in emesis. Abdominal pain is diffuse, aching and sore, 5/10, nothing makes better or worse. Pt also c/o diffuse back pain, aching and sore, 9/10. Denies falls or recent trauma.  Denies fever or chills. No urinary or vaginal symptoms.     Consulted with nurse at cancer center who reports pt did have a CT scan on 06/05/14, however, reports that was not for diagnostic imaging, it was to help with pt's treatment.  Last diagnostic CT scan was 02/2014.    Past Medical History  Diagnosis Date  . Headache(784.0) 2011    Migraines  . Esophageal reflux     On omeprazole  . Costochondritis   . Hypertension     started around 2011  . Ovarian cyst   . Stomach cancer   . Family history of cancer   . Heart murmur   . On antineoplastic chemotherapy     5-FU and leucovorin   Past Surgical History  Procedure Laterality Date  . Oophorectomy      Around 1985 left ovary removal  . Tonsillectomy      Around 1994  . Tubal ligation    . Esophagogastroduodenoscopy N/A 09/16/2013    Procedure: ESOPHAGOGASTRODUODENOSCOPY (EGD);  Surgeon: Wonda Horner, MD;  Location: [facility];  Service: Endoscopy;  Laterality: N/A;  . Laparoscopy N/A 09/20/2013    Procedure: DIAGNOSTIC LAPAROSCOPY,DISTAL GASTRECTOMY AND FEEDING  GASTROJEJUNOSTOMY;  Surgeon: [name], MD;  Location: [facility];  Service: General;  Laterality: N/A;  . Portacath placement Left 11/18/2013    Procedure: INSERTION PORT-A-CATH;  Surgeon: [name], MD;  Location: [facility];  Service: General;  Laterality: Left;   Family History  Problem Relation Age of Onset  . Hypertension Mother   . Diabetes Mellitus II Mother   . Hypertension Sister   . Hypertension Brother   . Heart failure Maternal Grandmother   . Hypertension Maternal Grandfather   . Prostate cancer Paternal Uncle     living at 40  . Gastric cancer Cousin 37    pat first cousin; daughter of pat uncle living with prostate ca   . Gastric cancer Cousin     mat first cousin; daughter of mat aunt who died in 84s due to heart issues  . Prostate cancer Paternal Uncle     deceased  . Prostate cancer Paternal Uncle     deceased  . Lung cancer Paternal Uncle     smoker; deceased   History  Substance Use Topics  . Smoking status: Former Research scientist (life sciences)  . Smokeless tobacco: Never Used     Comment: Stopped 2004  . Alcohol Use: No   OB History    No data available     Review of Systems  Constitutional: Positive for appetite change and unexpected weight change. Negative for  fever, chills, diaphoresis and fatigue.  Respiratory: Negative for cough and shortness of breath.   Cardiovascular: Negative for chest pain and palpitations.  Gastrointestinal: Positive for nausea, vomiting and abdominal pain. Negative for diarrhea, constipation, blood in stool and rectal pain.  Genitourinary: Negative for dysuria, urgency, hematuria, flank pain and pelvic pain.  Musculoskeletal: Negative for myalgias and back pain.  All other systems reviewed and are negative.     Allergies  Review of patient's allergies indicates no known allergies.  Home Medications   Prior to Admission medications   Medication Sig Start Date End Date Taking? Authorizing Provider  carvedilol (COREG) 6.25 MG tablet Take  6.25 mg by mouth 2 (two) times daily with a meal.   Yes Historical Provider, MD  hyaluronate sodium (RADIAPLEXRX) GEL Apply 1 application topically 2 (two) times daily as needed (stomach).    Yes Historical Provider, MD  lidocaine-prilocaine (EMLA) cream Apply 1 application topically as needed. Apply to port 1 hour before chemo 11/13/13  Yes Curt Bears, MD  LORazepam (ATIVAN) 1 MG tablet Take 1 tablet (1 mg total) by mouth every 8 (eight) hours as needed for anxiety. 06/03/14  Yes Curt Bears, MD  metoCLOPramide (REGLAN) 10 MG tablet Take 1 tablet (10 mg total) by mouth 2 (two) times daily. Then, stop taking this medication. 06/16/14  Yes Eppie Gibson, MD  Multiple Vitamins-Minerals (MULTIVITAMIN & MINERAL PO) Take 1 tablet by mouth daily.   Yes Historical Provider, MD  ondansetron (ZOFRAN ODT) 8 MG disintegrating tablet 8mg  ODT q4 hours prn nausea 06/19/14  Yes Veryl Speak, MD  OVER THE COUNTER MEDICATION    Yes Historical Provider, MD  oxyCODONE (OXY IR/ROXICODONE) 5 MG immediate release tablet Take 1 tablet (5 mg total) by mouth every 4 (four) hours as needed for severe pain. 02/17/14  Yes Maryanna Shape, NP  pantoprazole (PROTONIX) 40 MG tablet Take 1 tablet (40 mg total) by mouth 2 (two) times daily before a meal. 01/10/14  Yes Curt Bears, MD  capecitabine (XELODA) 500 MG tablet Take 2 tablets (1,000 mg total) by mouth 2 (two) times daily after a meal. Patient not taking: Reported on 06/09/2014 05/12/14   Curt Bears, MD  oxyCODONE-acetaminophen (PERCOCET/ROXICET) 5-325 MG per tablet Take 1 tablet by mouth every 6 (six) hours as needed for severe pain. 06/20/14   Noland Fordyce, PA-C   BP 117/69 mmHg  Pulse 85  Temp(Src) 98.8 F (37.1 C) (Oral)  Resp 18  SpO2 100%  LMP 05/30/2014 Physical Exam  Constitutional: She appears well-developed and well-nourished. No distress.  Pt is a thin female lying in exam bed, appears chronically ill.  HENT:  Head: Normocephalic and  atraumatic.  Eyes: Conjunctivae are normal. No scleral icterus.  Neck: Normal range of motion. Neck supple.  Cardiovascular: Normal rate, regular rhythm and normal heart sounds.   Pulmonary/Chest: Effort normal and breath sounds normal. No respiratory distress. She has no wheezes. She has no rales. She exhibits no tenderness.  Abdominal: Soft. Bowel sounds are normal. She exhibits no distension and no mass. There is tenderness ( mild, diffuse). There is no rebound and no guarding.  Soft, non-distended. Diffuse tenderness w/o rebound, guarding, or masses.   Musculoskeletal: Normal range of motion.  Neurological: She is alert.  Skin: Skin is warm and dry. She is not diaphoretic.  Nursing note and vitals reviewed.   ED Course  Procedures (including critical care time) Labs Review Labs Reviewed  CBC WITH DIFFERENTIAL/PLATELET - Abnormal; Notable for the following:  Platelets 137 (*)    Neutrophils Relative % 83 (*)    Lymphocytes Relative 8 (*)    Lymphs Abs 0.4 (*)    All other components within normal limits  URINALYSIS, ROUTINE W REFLEX MICROSCOPIC - Abnormal; Notable for the following:    APPearance CLOUDY (*)    Ketones, ur >80 (*)    All other components within normal limits  COMPREHENSIVE METABOLIC PANEL  LIPASE, BLOOD  POC URINE PREG, ED  I-STAT CG4 LACTIC ACID, ED    Imaging Review Ct Abdomen Pelvis W Contrast  06/20/2014   CLINICAL DATA:  Stage II gastric adenocarcinoma status post resection with lymph node dissection. Status post adjuvant chemotherapy. Nausea/vomiting.  EXAM: CT ABDOMEN AND PELVIS WITH CONTRAST  TECHNIQUE: Multidetector CT imaging of the abdomen and pelvis was performed using the standard protocol following bolus administration of intravenous contrast.  CONTRAST:  171mL OMNIPAQUE IOHEXOL 300 MG/ML  SOLN  COMPARISON:  CT abdomen pelvis dated 03/21/2014.  FINDINGS: Lower chest:  Mild scarring in the lateral right lung base.  Hepatobiliary: Liver is within  normal limits. No suspicious/ enhancing hepatic lesions.  Gallbladder is unremarkable. No intrahepatic or extrahepatic ductal dilatation.  Pancreas: Pancreatic atrophy of the body/ tail. No associated pancreatic mass.  Spleen: Within normal limits.  Adrenals/Urinary Tract: Adrenal glands are within normal limits.  Kidneys within normal limits.  No hydronephrosis.  Bladder is mildly thick-walled but otherwise within normal limits.  Stomach/Bowel: Status post partial gastrectomy with gastrojejunostomy. Prior gastric wall thickening is improved.  No evidence of bowel obstruction.  Normal appendix.  Vascular/Lymphatic: No evidence of abdominal aortic aneurysm.  No suspicious abdominopelvic lymphadenopathy. Specifically, no suspicious perigastric lymph nodes.  Reproductive: Uterine fibroids, including a dominant 3.1 x 3.5 cm intramural posterior uterine body fibroid with 30% submucosal component (series 2/ image 57).  Bilateral ovaries are within normal limits.  Other: No abdominopelvic ascites.  Musculoskeletal: Visualized osseous structures are within normal limits.  IMPRESSION: Status post partial gastrectomy with gastrojejunostomy.  No evidence of recurrent or metastatic disease.  No evidence of bowel obstruction.  Normal appendix.  Additional stable ancillary findings as above.   Electronically Signed   By: Julian Hy M.D.   On: 06/20/2014 21:15     EKG Interpretation None      MDM   Final diagnoses:  Generalized abdominal pain  Non-intractable vomiting with nausea, vomiting of unspecified type  History of stomach cancer    Pt is a 49yo female with hx of stomach cancer, sent to ED from cancer center for further evaluation of abdominal pain, nausea and vomiting.    Pt has had 2 episodes of vomiting today.  Mild diffuse abdominal tenderness w/o rebound or guarding.  No CVAT.  Low concern for surgical abdomen, however, due to hx of stomach cancer and last CT scan Jan. 2016.    Labs:  unremarkable.  CT abd: unremarkable.  No evidence of acute findings.   Pain improved with dilaudid, pt given reglan for nausea as pt states reglan controls her nausea longer than the zofran.   9:45 PM Pt able to keep down several ounces of orange juice as well as crackers. No vomiting while in ED.  Pt states she feels comfortable being discharged home. Pt requested home pain medication. Initially pt requested dilaudid. Advised pt percocet could be prescribed from ED.  Will give 6 tabs. Encouraged pt to speak with her oncologist and PCP for addition pain medication and to f/u for abdominal pain, nausea and  vomiting. Pt states she has nausea medication at home and does not need a refill.  Pt plans on calling cancer center tomorrow as she reports they are open on weekends. Return precautions provided. Pt verbalized understanding and agreement with tx plan.   Noland Fordyce, PA-C 06/20/14 2354  Wandra Arthurs, MD 06/21/14 [phone]

## 2014-06-20 NOTE — Patient Instructions (Signed)
You are being taken to the emergency department for further evaluation and management of your uncontrolled high blood pressure, nausea, vomiting, light headedness and pain You will be scheduled to see Dr. [name] in the next 1-2 weeks to further discuss your treatment options

## 2014-06-23 ENCOUNTER — Telehealth: Payer: Self-pay | Admitting: Internal Medicine

## 2014-06-23 ENCOUNTER — Ambulatory Visit: Payer: Medicaid Other

## 2014-06-23 NOTE — Telephone Encounter (Signed)
S/w pt confirming labs/ov per 04/29 POF, pt also request a refill on her LORazepam (ATIVAN) 1 MG tablet, routed msg to MD/Nurse for the refill, pt will need to be called once this is called in.Cherylann Banas, Per MD D/T for 05/09

## 2014-06-24 ENCOUNTER — Telehealth: Payer: Self-pay | Admitting: *Deleted

## 2014-06-24 NOTE — Telephone Encounter (Signed)
"  The patient missed several appointments. She needs reevaluation before refilling her Ativan." (Dr [name])  Left patient a message stating Dr [name] will discuss refill she requested at her next appt with him

## 2014-06-25 NOTE — Progress Notes (Signed)
Patient at my previous practice, not currently under my care

## 2014-06-27 ENCOUNTER — Other Ambulatory Visit: Payer: Self-pay | Admitting: Physician Assistant

## 2014-06-30 ENCOUNTER — Ambulatory Visit (HOSPITAL_BASED_OUTPATIENT_CLINIC_OR_DEPARTMENT_OTHER): Payer: Medicaid Other | Admitting: Internal Medicine

## 2014-06-30 ENCOUNTER — Encounter: Payer: Self-pay | Admitting: Internal Medicine

## 2014-06-30 ENCOUNTER — Other Ambulatory Visit (HOSPITAL_BASED_OUTPATIENT_CLINIC_OR_DEPARTMENT_OTHER): Payer: Medicaid Other

## 2014-06-30 VITALS — BP 111/73 | HR 62 | Temp 97.8°F | Resp 18 | Ht 60.0 in | Wt 119.0 lb

## 2014-06-30 DIAGNOSIS — C169 Malignant neoplasm of stomach, unspecified: Secondary | ICD-10-CM

## 2014-06-30 DIAGNOSIS — R52 Pain, unspecified: Secondary | ICD-10-CM

## 2014-06-30 DIAGNOSIS — C779 Secondary and unspecified malignant neoplasm of lymph node, unspecified: Secondary | ICD-10-CM

## 2014-06-30 DIAGNOSIS — C770 Secondary and unspecified malignant neoplasm of lymph nodes of head, face and neck: Secondary | ICD-10-CM

## 2014-06-30 DIAGNOSIS — G4700 Insomnia, unspecified: Secondary | ICD-10-CM | POA: Insufficient documentation

## 2014-06-30 DIAGNOSIS — M546 Pain in thoracic spine: Secondary | ICD-10-CM

## 2014-06-30 LAB — COMPREHENSIVE METABOLIC PANEL (CC13)
ALT: 25 U/L (ref 0–55)
ANION GAP: 10 meq/L (ref 3–11)
AST: 30 U/L (ref 5–34)
Albumin: 3.7 g/dL (ref 3.5–5.0)
Alkaline Phosphatase: 57 U/L (ref 40–150)
BILIRUBIN TOTAL: 0.53 mg/dL (ref 0.20–1.20)
BUN: 6.2 mg/dL — ABNORMAL LOW (ref 7.0–26.0)
CHLORIDE: 105 meq/L (ref 98–109)
CO2: 29 meq/L (ref 22–29)
Calcium: 9.2 mg/dL (ref 8.4–10.4)
Creatinine: 0.8 mg/dL (ref 0.6–1.1)
EGFR: >90 mL/min/{1.73_m2} (ref >90)
Glucose: 95 mg/dl (ref 70–140)
Potassium: 4 mEq/L (ref 3.5–5.1)
Sodium: 143 mEq/L (ref 136–145)
Total Protein: 6.5 g/dL (ref 6.4–8.3)

## 2014-06-30 LAB — CBC WITH DIFFERENTIAL/PLATELET
BASO%: 0.5 % (ref 0.0–2.0)
Basophils Absolute: 0 10*3/uL (ref 0.0–0.1)
EOS%: 3.2 % (ref 0.0–7.0)
Eosinophils Absolute: 0.1 10*3/uL (ref 0.0–0.5)
HEMATOCRIT: 36.4 % (ref 34.8–46.6)
HEMOGLOBIN: 11.7 g/dL (ref 11.6–15.9)
LYMPH%: 22.6 % (ref 14.0–49.7)
MCH: 27.7 pg (ref 25.1–34.0)
MCHC: 32.1 g/dL (ref 31.5–36.0)
MCV: 86.1 fL (ref 79.5–101.0)
MONO#: 0.3 10*3/uL (ref 0.1–0.9)
MONO%: 17.9 % — AB (ref 0.0–14.0)
NEUT#: 1.1 10*3/uL — ABNORMAL LOW (ref 1.5–6.5)
NEUT%: 55.8 % (ref 38.4–76.8)
Platelets: 198 10*3/uL (ref 145–400)
RBC: 4.23 10*6/uL (ref 3.70–5.45)
RDW: 13.5 % (ref 11.2–14.5)
WBC: 1.9 10*3/uL — ABNORMAL LOW (ref 3.9–10.3)
lymph#: 0.4 10*3/uL — ABNORMAL LOW (ref 0.9–3.3)

## 2014-06-30 MED ORDER — MEGESTROL ACETATE 625 MG/5ML PO SUSP: 625.0000 mg | Freq: Every day | ORAL | Status: DC

## 2014-06-30 MED ORDER — TEMAZEPAM 30 MG PO CAPS: 30.0000 mg | ORAL_CAPSULE | Freq: Every evening | ORAL | Status: DC | PRN

## 2014-06-30 NOTE — Progress Notes (Signed)
[place] [phone]   [phone]  OFFICE PROGRESS NOTE  Arnoldo Morale, MD [place] [address]  DIAGNOSIS: Stage IIA (T1a, N2, M0) gastric adenocarcinoma diagnosed in July of 2015  PRIOR THERAPY: 1) Status post diagnostic laparoscopy, distal gastrectomy with billroth II anastamosis and feeding gastrojejunostomy tube under the care of Dr. Barry Dienes on 09/20/2013. 2) Adjuvant systemic chemotherapy with 5-FU 600 mg/M2 with leucovorin on days 1, 8, 15, 22, 29 and 36 every 8 weeks. First dose 11/19/2013. 3) Adjuvant concurrent chemoradiation with Xeloda 650 MG/M2 by mouth twice a day for 5 days every week with radiation. The patient completed the course of radiotherapy but she declined to take Xeloda during her radiation.  CURRENT THERAPY: None.  INTERVAL HISTORY: Paula [name] 49 y.o. female returns to the clinic today for followup visit. The patient completed a course of adjuvant systemic chemotherapy with 5-FU and leucovorin followed by a course of radiotherapy with no concurrent chemotherapy secondary to entrance. She has been doing fine and recently with no specific complaints. She denied having any significant fever or chills, no nausea or vomiting. She denied having any significant shortness of breath or hemoptysis. She has no significant weight loss or night sweats. She continues to complain of insomnia and requesting refill of Ativan. She is today for reevaluation and discussion of her treatment options.  MEDICAL HISTORY: Past Medical History  Diagnosis Date  . Headache(784.0) 2011    Migraines  . Esophageal reflux     On omeprazole  . Costochondritis   . Hypertension     started around 2011  . Ovarian cyst   . Stomach cancer   . Family history of cancer   . Heart murmur   . On antineoplastic chemotherapy     5-FU and leucovorin    ALLERGIES:  has No Known Allergies.  MEDICATIONS:  Current Outpatient  Prescriptions  Medication Sig Dispense Refill  . capecitabine (XELODA) 500 MG tablet Take 2 tablets (1,000 mg total) by mouth 2 (two) times daily after a meal. 120 tablet 1  . carvedilol (COREG) 6.25 MG tablet Take 6.25 mg by mouth 2 (two) times daily with a meal.    . hyaluronate sodium (RADIAPLEXRX) GEL Apply 1 application topically 2 (two) times daily as needed (stomach).     . lidocaine-prilocaine (EMLA) cream Apply 1 application topically as needed. Apply to port 1 hour before chemo 30 g 0  . LORazepam (ATIVAN) 1 MG tablet Take 1 tablet (1 mg total) by mouth every 8 (eight) hours as needed for anxiety. 30 tablet 0  . metoCLOPramide (REGLAN) 10 MG tablet Take 1 tablet (10 mg total) by mouth 2 (two) times daily. Then, stop taking this medication. 60 tablet 0  . Multiple Vitamins-Minerals (MULTIVITAMIN & MINERAL PO) Take 1 tablet by mouth daily.    . ondansetron (ZOFRAN ODT) 8 MG disintegrating tablet 8mg  ODT q4 hours prn nausea 6 tablet 0  . OVER THE COUNTER MEDICATION     . oxyCODONE (OXY IR/ROXICODONE) 5 MG immediate release tablet Take 1 tablet (5 mg total) by mouth every 4 (four) hours as needed for severe pain. 45 tablet 0  . oxyCODONE-acetaminophen (PERCOCET/ROXICET) 5-325 MG per tablet Take 1 tablet by mouth every 6 (six) hours as needed for severe pain. 6 tablet 0  . pantoprazole (PROTONIX) 40 MG tablet Take 1 tablet (40 mg total) by mouth 2 (two) times daily before a meal. 60 tablet 3  No current facility-administered medications for this visit.    SURGICAL HISTORY:  Past Surgical History  Procedure Laterality Date  . Oophorectomy      Around 1985 left ovary removal  . Tonsillectomy      Around 1994  . Tubal ligation    . Esophagogastroduodenoscopy N/A 09/16/2013    Procedure: ESOPHAGOGASTRODUODENOSCOPY (EGD);  Surgeon: Wonda Horner, MD;  Location: [facility];  Service: Endoscopy;  Laterality: N/A;  . Laparoscopy N/A 09/20/2013    Procedure: DIAGNOSTIC LAPAROSCOPY,DISTAL  GASTRECTOMY AND FEEDING GASTROJEJUNOSTOMY;  Surgeon: [name], MD;  Location: [facility];  Service: General;  Laterality: N/A;  . Portacath placement Left 11/18/2013    Procedure: INSERTION PORT-A-CATH;  Surgeon: [name], MD;  Location: [facility];  Service: General;  Laterality: Left;    REVIEW OF SYSTEMS:  Constitutional: positive for fatigue Eyes: negative Ears, nose, mouth, throat, and face: negative Respiratory: negative Cardiovascular: negative Gastrointestinal: negative Genitourinary:negative Integument/breast: negative Hematologic/lymphatic: negative Musculoskeletal:negative Neurological: negative Behavioral/Psych: negative Endocrine: negative Allergic/Immunologic: negative   PHYSICAL EXAMINATION: General appearance: alert, cooperative, fatigued and no distress Head: Normocephalic, without obvious abnormality, atraumatic Neck: no adenopathy, no JVD, supple, symmetrical, trachea midline and thyroid not enlarged, symmetric, no tenderness/mass/nodules Lymph nodes: Cervical, supraclavicular, and axillary nodes normal. Resp: clear to auscultation bilaterally Back: symmetric, no curvature. ROM normal. No CVA tenderness. Cardio: regular rate and rhythm, S1, S2 normal, no murmur, click, rub or gallop GI: soft, non-tender; bowel sounds normal; no masses,  no organomegaly Extremities: extremities normal, atraumatic, no cyanosis or edema Neurologic: Alert and oriented X 3, normal strength and tone. Normal symmetric reflexes. Normal coordination and gait  ECOG PERFORMANCE STATUS: 1 - Symptomatic but completely ambulatory  Blood pressure 111/73, pulse 62, temperature 97.8 F (36.6 C), temperature source Oral, resp. rate 18, height 5' (1.524 m), weight 119 lb (53.978 kg), last menstrual period 05/30/2014, SpO2 100 %.  LABORATORY DATA: Lab Results  Component Value Date   WBC 1.9* 06/30/2014   HGB 11.7 06/30/2014   HCT 36.4 06/30/2014   MCV 86.1 06/30/2014   PLT 198 06/30/2014       Chemistry      Component Value Date/Time   NA 136 06/20/2014 1735   NA 135* 06/20/2014 1422   K 4.5 06/20/2014 1735   K 3.9 06/20/2014 1422   CL 98 06/20/2014 1735   CO2 24 06/20/2014 1735   CO2 24 06/20/2014 1422   BUN 11 06/20/2014 1735   BUN 10.5 06/20/2014 1422   CREATININE 0.70 06/20/2014 1735   CREATININE 0.8 06/20/2014 1422      Component Value Date/Time   CALCIUM 9.3 06/20/2014 1735   CALCIUM 9.6 06/20/2014 1422   ALKPHOS 67 06/20/2014 1735   ALKPHOS 76 06/20/2014 1422   AST 30 06/20/2014 1735   AST 18 06/20/2014 1422   ALT 16 06/20/2014 1735   ALT 13 06/20/2014 1422   BILITOT 1.1 06/20/2014 1735   BILITOT 0.79 06/20/2014 1422       RADIOGRAPHIC STUDIES:  ASSESSMENT AND PLAN: this is a very pleasant 49 years old [place] female recently diagnosed with a stage IIA gastric adenocarcinoma status post resection with lymph node dissection. She completed the first 2 cycles adjuvant chemotherapy with 5-FU and leucovorin and tolerated her treatment fairly well except for neutropenia and mild nausea. This was followed by a course of adjuvant radiotherapy and the patient was unable to tolerate the concurrent chemotherapy with Xeloda which was discontinued. I had a lengthy discussion with the patient  and her husband today about her current condition and treatment options. I gave the patient the option of proceeding with 2 more cycles of adjuvant chemotherapy with weekly 5-FU/leucovorin for a total of 8 weeks versus close monitoring and observation as well as repeat CT scan of the abdomen and pelvis in 3 months. The patient requested more time to think about her option and discuss it with her family. She will let us know within the next few days of her final decision. For pain management, she will continue on her current pain medication. For insomnia, I started the patient on Restoril 30 mg by mouth daily at bedtime and she will discontinue Ativan. I did not give the  patient a follow-up appointment until you hear from her regarding her final decision with treatment or observation. She was advised to call immediately if she has any concerning symptoms in the interval.  The patient voices understanding of current disease status and treatment options and is in agreement with the current care plan.  All questions were answered. The patient knows to call the clinic with any problems, questions or concerns. We can certainly see the patient much sooner if necessary.  Disclaimer: This note was dictated with voice recognition software. Similar sounding words can inadvertently be transcribed and may not be corrected upon review.

## 2014-07-01 ENCOUNTER — Emergency Department (HOSPITAL_BASED_OUTPATIENT_CLINIC_OR_DEPARTMENT_OTHER)
Admission: EM | Admit: 2014-07-01 | Discharge: 2014-07-01 | Disposition: A | Discharge status: Home / Self Care | Payer: Medicaid Other | Attending: Emergency Medicine | Admitting: Emergency Medicine

## 2014-07-01 ENCOUNTER — Encounter (HOSPITAL_BASED_OUTPATIENT_CLINIC_OR_DEPARTMENT_OTHER): Payer: Self-pay

## 2014-07-01 ENCOUNTER — Other Ambulatory Visit: Payer: Self-pay

## 2014-07-01 ENCOUNTER — Emergency Department (HOSPITAL_BASED_OUTPATIENT_CLINIC_OR_DEPARTMENT_OTHER): Payer: Medicaid Other

## 2014-07-01 DIAGNOSIS — I10 Essential (primary) hypertension: Secondary | ICD-10-CM | POA: Insufficient documentation

## 2014-07-01 DIAGNOSIS — Z8739 Personal history of other diseases of the musculoskeletal system and connective tissue: Secondary | ICD-10-CM | POA: Diagnosis not present

## 2014-07-01 DIAGNOSIS — R1013 Epigastric pain: Secondary | ICD-10-CM | POA: Insufficient documentation

## 2014-07-01 DIAGNOSIS — C169 Malignant neoplasm of stomach, unspecified: Secondary | ICD-10-CM | POA: Insufficient documentation

## 2014-07-01 DIAGNOSIS — G8929 Other chronic pain: Secondary | ICD-10-CM | POA: Diagnosis not present

## 2014-07-01 DIAGNOSIS — Z8742 Personal history of other diseases of the female genital tract: Secondary | ICD-10-CM | POA: Diagnosis not present

## 2014-07-01 DIAGNOSIS — Z79899 Other long term (current) drug therapy: Secondary | ICD-10-CM | POA: Insufficient documentation

## 2014-07-01 DIAGNOSIS — Z3202 Encounter for pregnancy test, result negative: Secondary | ICD-10-CM | POA: Insufficient documentation

## 2014-07-01 DIAGNOSIS — R011 Cardiac murmur, unspecified: Secondary | ICD-10-CM | POA: Diagnosis not present

## 2014-07-01 DIAGNOSIS — Z87891 Personal history of nicotine dependence: Secondary | ICD-10-CM | POA: Insufficient documentation

## 2014-07-01 DIAGNOSIS — K219 Gastro-esophageal reflux disease without esophagitis: Secondary | ICD-10-CM | POA: Diagnosis not present

## 2014-07-01 DIAGNOSIS — R112 Nausea with vomiting, unspecified: Secondary | ICD-10-CM

## 2014-07-01 DIAGNOSIS — R109 Unspecified abdominal pain: Secondary | ICD-10-CM

## 2014-07-01 DIAGNOSIS — Z9851 Tubal ligation status: Secondary | ICD-10-CM | POA: Diagnosis not present

## 2014-07-01 LAB — COMPREHENSIVE METABOLIC PANEL
ALBUMIN: 4.7 g/dL (ref 3.5–5.0)
ALK PHOS: 64 U/L (ref 38–126)
ALT: 30 U/L (ref 14–54)
AST: 32 U/L (ref 15–41)
Anion gap: 11 (ref 5–15)
BUN: 7 mg/dL (ref 6–20)
CO2: 27 mmol/L (ref 22–32)
Calcium: 9.9 mg/dL (ref 8.9–10.3)
Chloride: 101 mmol/L (ref 101–111)
Creatinine, Ser: 0.66 mg/dL (ref 0.44–1.00)
GFR calc Af Amer: >60 mL/min (ref >60)
GFR calc non Af Amer: >60 mL/min (ref >60)
GLUCOSE: 158 mg/dL — AB (ref 70–99)
POTASSIUM: 3.8 mmol/L (ref 3.5–5.1)
Sodium: 139 mmol/L (ref 135–145)
TOTAL PROTEIN: 8.2 g/dL — AB (ref 6.5–8.1)
Total Bilirubin: 0.5 mg/dL (ref 0.3–1.2)

## 2014-07-01 LAB — CBC WITH DIFFERENTIAL/PLATELET
Basophils Absolute: 0 10*3/uL (ref 0.0–0.1)
Basophils Relative: 0 % (ref 0–1)
EOS ABS: 0 10*3/uL (ref 0.0–0.7)
Eosinophils Relative: 0 % (ref 0–5)
HCT: 37.8 % (ref 36.0–46.0)
Hemoglobin: 12.5 g/dL (ref 12.0–15.0)
Lymphocytes Relative: 5 % — ABNORMAL LOW (ref 12–46)
Lymphs Abs: 0.3 10*3/uL — ABNORMAL LOW (ref 0.7–4.0)
MCH: 27.8 pg (ref 26.0–34.0)
MCHC: 33.1 g/dL (ref 30.0–36.0)
MCV: 84.2 fL (ref 78.0–100.0)
MONO ABS: 0.1 10*3/uL (ref 0.1–1.0)
Monocytes Relative: 2 % — ABNORMAL LOW (ref 3–12)
NEUTROS ABS: 5.1 10*3/uL (ref 1.7–7.7)
NEUTROS PCT: 93 % — AB (ref 43–77)
Platelets: 274 10*3/uL (ref 150–400)
RBC: 4.49 MIL/uL (ref 3.87–5.11)
RDW: 13 % (ref 11.5–15.5)
WBC: 5.4 10*3/uL (ref 4.0–10.5)

## 2014-07-01 LAB — URINALYSIS, ROUTINE W REFLEX MICROSCOPIC
BILIRUBIN URINE: NEGATIVE
Glucose, UA: 100 mg/dL — AB
Hgb urine dipstick: NEGATIVE
KETONES UR: 40 mg/dL — AB
Leukocytes, UA: NEGATIVE
Nitrite: NEGATIVE
Protein, ur: NEGATIVE mg/dL
Specific Gravity, Urine: 1.014 (ref 1.005–1.030)
UROBILINOGEN UA: 0.2 mg/dL (ref 0.0–1.0)
pH: 7.5 (ref 5.0–8.0)

## 2014-07-01 LAB — TROPONIN I: Troponin I: <0.03 ng/mL (ref <0.031)

## 2014-07-01 LAB — LIPASE, BLOOD: Lipase: 17 U/L — ABNORMAL LOW (ref 22–51)

## 2014-07-01 LAB — PREGNANCY, URINE: PREG TEST UR: NEGATIVE

## 2014-07-01 MED ORDER — SODIUM CHLORIDE 0.9 % IV SOLN: 1000.0000 mL | Freq: Once | INTRAVENOUS | Status: DC

## 2014-07-01 MED ORDER — PROMETHAZINE HCL 25 MG PO TABS: 25.0000 mg | ORAL_TABLET | Freq: Four times a day (QID) | ORAL | Status: DC | PRN

## 2014-07-01 MED ORDER — SODIUM CHLORIDE 0.9 % IV SOLN
Freq: Once | INTRAVENOUS | Status: AC
  Administered 2014-07-01: 11:00:00 via INTRAVENOUS

## 2014-07-01 MED ORDER — HYDROMORPHONE HCL 1 MG/ML IJ SOLN
1.0000 mg | INTRAMUSCULAR | Status: DC | PRN
  Administered 2014-07-01 (×2): 1 mg via INTRAVENOUS
  Filled 2014-07-01 (×2): qty 1

## 2014-07-01 MED ORDER — ONDANSETRON HCL 4 MG/2ML IJ SOLN
4.0000 mg | Freq: Once | INTRAMUSCULAR | Status: AC
  Administered 2014-07-01: 4 mg via INTRAVENOUS
  Filled 2014-07-01: qty 2

## 2014-07-01 MED ORDER — SODIUM CHLORIDE 0.9 % IV SOLN
1000.0000 mL | INTRAVENOUS | Status: DC
  Administered 2014-07-01: 1000 mL via INTRAVENOUS

## 2014-07-01 NOTE — ED Notes (Signed)
Pt reports she is in pain 4/10, however is intermittently sleeping.

## 2014-07-01 NOTE — Discharge Instructions (Signed)

## 2014-07-01 NOTE — ED Notes (Signed)
MD at bedside. 

## 2014-07-01 NOTE — ED Notes (Signed)
State she came for evaluation of vomiting, chest pain that started last night.

## 2014-07-01 NOTE — ED Notes (Signed)
Patient asked to change into a gown, RN at bedside for triage.

## 2014-07-01 NOTE — ED Provider Notes (Signed)
CSN: [id number]     Arrival date & time 07/01/14  0840 History   First MD Initiated Contact with Patient 07/01/14 [phone]     Chief Complaint  Patient presents with  . Emesis    HPI Pt has history of gastric cancer.  She was treated with chemo and radiation.  Last chemo treatment was 2 months ago, last radiation was 2 weeks ago.  She has had trouble with pain in the abdomen and vomiting.  This episode started yesterday.  The pain is in the upper abdomomen and chest area.  She has vomited multiple times.  No blood in the emesis.  Cannot keep anything down.  She would like dilaudid and zofran.  Past Medical History  Diagnosis Date  . Headache(784.0) 2011    Migraines  . Esophageal reflux     On omeprazole  . Costochondritis   . Hypertension     started around 2011  . Ovarian cyst   . Stomach cancer   . Family history of cancer   . Heart murmur   . On antineoplastic chemotherapy     5-FU and leucovorin   Past Surgical History  Procedure Laterality Date  . Oophorectomy      Around 1985 left ovary removal  . Tonsillectomy      Around 1994  . Tubal ligation    . Esophagogastroduodenoscopy N/A 09/16/2013    Procedure: ESOPHAGOGASTRODUODENOSCOPY (EGD);  Surgeon: Wonda Horner, MD;  Location: [facility];  Service: Endoscopy;  Laterality: N/A;  . Laparoscopy N/A 09/20/2013    Procedure: DIAGNOSTIC LAPAROSCOPY,DISTAL GASTRECTOMY AND FEEDING GASTROJEJUNOSTOMY;  Surgeon: [name], MD;  Location: [facility];  Service: General;  Laterality: N/A;  . Portacath placement Left 11/18/2013    Procedure: INSERTION PORT-A-CATH;  Surgeon: [name], MD;  Location: [facility];  Service: General;  Laterality: Left;   Family History  Problem Relation Age of Onset  . Hypertension Mother   . Diabetes Mellitus II Mother   . Hypertension Sister   . Hypertension Brother   . Heart failure Maternal Grandmother   . Hypertension Maternal Grandfather   . Prostate cancer Paternal Uncle     living at 39  .  Gastric cancer Cousin 11    pat first cousin; daughter of pat uncle living with prostate ca   . Gastric cancer Cousin     mat first cousin; daughter of mat aunt who died in 42s due to heart issues  . Prostate cancer Paternal Uncle     deceased  . Prostate cancer Paternal Uncle     deceased  . Lung cancer Paternal Uncle     smoker; deceased   History  Substance Use Topics  . Smoking status: Former Research scientist (life sciences)  . Smokeless tobacco: Never Used     Comment: Stopped 2004  . Alcohol Use: No   OB History    No data available     Review of Systems  Constitutional: Negative for fever.  Gastrointestinal: Negative for diarrhea and blood in stool.  Genitourinary: Negative for dysuria.  All other systems reviewed and are negative.     Allergies  Review of patient's allergies indicates no known allergies.  Home Medications   Prior to Admission medications   Medication Sig Start Date End Date Taking? Authorizing Provider  capecitabine (XELODA) 500 MG tablet Take 2 tablets (1,000 mg total) by mouth 2 (two) times daily after a meal. 05/12/14   Curt Bears, MD  carvedilol (COREG) 6.25 MG tablet Take 6.25 mg by  mouth 2 (two) times daily with a meal.    Historical Provider, MD  hyaluronate sodium (RADIAPLEXRX) GEL Apply 1 application topically 2 (two) times daily as needed (stomach).     Historical Provider, MD  lidocaine-prilocaine (EMLA) cream Apply 1 application topically as needed. Apply to port 1 hour before chemo 11/13/13   Curt Bears, MD  megestrol (MEGACE ES) 625 MG/5ML suspension Take 5 mLs (625 mg total) by mouth daily. 06/30/14   Curt Bears, MD  metoCLOPramide (REGLAN) 10 MG tablet Take 1 tablet (10 mg total) by mouth 2 (two) times daily. Then, stop taking this medication. 06/16/14   Eppie Gibson, MD  Multiple Vitamins-Minerals (MULTIVITAMIN & MINERAL PO) Take 1 tablet by mouth daily.    Historical Provider, MD  ondansetron (ZOFRAN ODT) 8 MG disintegrating tablet 8mg  ODT q4  hours prn nausea 06/19/14   Veryl Speak, MD  OVER THE COUNTER MEDICATION     Historical Provider, MD  oxyCODONE (OXY IR/ROXICODONE) 5 MG immediate release tablet Take 1 tablet (5 mg total) by mouth every 4 (four) hours as needed for severe pain. 02/17/14   Maryanna Shape, NP  oxyCODONE-acetaminophen (PERCOCET/ROXICET) 5-325 MG per tablet Take 1 tablet by mouth every 6 (six) hours as needed for severe pain. 06/20/14   Noland Fordyce, PA-C  pantoprazole (PROTONIX) 40 MG tablet Take 1 tablet (40 mg total) by mouth 2 (two) times daily before a meal. 01/10/14   Curt Bears, MD  promethazine (PHENERGAN) 25 MG tablet Take 1 tablet (25 mg total) by mouth every 6 (six) hours as needed for nausea or vomiting. 07/01/14   Dorie Rank, MD  temazepam (RESTORIL) 30 MG capsule Take 1 capsule (30 mg total) by mouth at bedtime as needed for sleep. 06/30/14   Curt Bears, MD   BP 159/92 mmHg  Pulse 93  Temp(Src) 97.8 F (36.6 C) (Oral)  Resp 16  Ht 5\' 4"  (1.626 m)  Wt 119 lb (53.978 kg)  BMI 20.42 kg/m2  SpO2 98%  LMP 05/30/2014 Physical Exam  Constitutional: She has a sickly appearance. She appears distressed.  HENT:  Head: Normocephalic and atraumatic.  Right Ear: External ear normal.  Left Ear: External ear normal.  Eyes: Conjunctivae are normal. Right eye exhibits no discharge. Left eye exhibits no discharge. No scleral icterus.  Neck: Neck supple. No tracheal deviation present.  Cardiovascular: Normal rate, regular rhythm and intact distal pulses.   Pulmonary/Chest: Effort normal and breath sounds normal. No stridor. No respiratory distress. She has no wheezes. She has no rales.  Abdominal: Soft. Bowel sounds are normal. She exhibits no distension. There is tenderness in the epigastric area. There is no rigidity, no rebound, no guarding and no CVA tenderness. No hernia.  Musculoskeletal: She exhibits no edema or tenderness.  Neurological: She is alert. She has normal strength. No cranial nerve  deficit (no facial droop, extraocular movements intact, no slurred speech) or sensory deficit. She exhibits normal muscle tone. She displays no seizure activity. Coordination normal.  Skin: Skin is warm and dry. No rash noted.  Psychiatric: She has a normal mood and affect.  Nursing note and vitals reviewed.   ED Course  Procedures (including critical care time) Labs Review Labs Reviewed  CBC WITH DIFFERENTIAL/PLATELET - Abnormal; Notable for the following:    Neutrophils Relative % 93 (*)    Lymphocytes Relative 5 (*)    Lymphs Abs 0.3 (*)    Monocytes Relative 2 (*)    All other components within normal limits  COMPREHENSIVE METABOLIC PANEL - Abnormal; Notable for the following:    Glucose, Bld 158 (*)    Total Protein 8.2 (*)    All other components within normal limits  URINALYSIS, ROUTINE W REFLEX MICROSCOPIC - Abnormal; Notable for the following:    APPearance CLOUDY (*)    Glucose, UA 100 (*)    Ketones, ur 40 (*)    All other components within normal limits  LIPASE, BLOOD - Abnormal; Notable for the following:    Lipase 17 (*)    All other components within normal limits  TROPONIN I  PREGNANCY, URINE    Imaging Review Dg Abd Acute W/chest  07/01/2014   CLINICAL DATA:  Fever and vomiting since yesterday, some diarrhea this morning, history hypertension, stomach cancer, esophageal reflux  EXAM: DG ABDOMEN ACUTE W/ 1V CHEST  COMPARISON:  11/24/2013 chest radiograph, abdominal radiograph 02/17/2014  FINDINGS: RIGHT subclavian power port with tip projecting over SVC.  Normal heart size, mediastinal contours, and pulmonary vascularity.  Chronic changes at RIGHT lung base likely represents scarring, stable.  No acute infiltrate, pleural effusion or pneumothorax.  Surgical clips and anastomotic staple lines in LEFT upper quadrant.  Normal bowel gas pattern.  No bowel dilatation, bowel wall thickening or free intraperitoneal air.  Tubal ligation rings in pelvis.  Osseous structures  unremarkable.  No urinary tract calcification.  IMPRESSION: RIGHT lateral basilar lung scarring.  No acute abdominal findings.   Electronically Signed   By: Lavonia Dana M.D.   On: 07/01/2014 10:03     EKG Interpretation   Date/Time:  Tuesday Jul 01 2014 08:53:55 EDT Ventricular Rate:  101 PR Interval:  124 QRS Duration: 80 QT Interval:  346 QTC Calculation: 448 R Axis:   76 Text Interpretation:  Sinus tachycardia Left ventricular hypertrophy ST  \T\ T wave abnormality, consider inferior ischemia Abnormal ECG No  significant change since last tracing Confirmed by Crandall Harvel  MD-J, Arsalan Brisbin  ([id number]) on 07/01/2014 10:15:19 AM     Medications  0.9 %  sodium chloride infusion (not administered)    Followed by  0.9 %  sodium chloride infusion (0 mLs Intravenous Stopped 07/01/14 1110)  HYDROmorphone (DILAUDID) injection 1 mg (1 mg Intravenous Given 07/01/14 1127)  0.9 %  sodium chloride infusion ( Intravenous Stopped 07/01/14 1111)  ondansetron (ZOFRAN) injection 4 mg (4 mg Intravenous Given 07/01/14 0925)    MDM   Final diagnoses:  Chronic abdominal pain  Non-intractable vomiting with nausea, vomiting of unspecified type    The patient's symptoms improved with treatment although not completely resolved. Patient does have a history of recurrent episodes of abdominal pain with vomiting. She was seen the end of April for similar symptoms. Patient did have a CT scan of the abdomen and pelvis at that time that did not show any evidence of acute abnormalities. Her symptoms may be related to her history of gastric cancer, chemotherapy and radiation treatments. I doubt acute obstruction or acute infection.  At this time there does not appear to be any evidence of an acute emergency medical condition and the patient appears stable for discharge with appropriate outpatient follow up.     Dorie Rank, MD 07/01/14 [phone]

## 2014-07-01 NOTE — ED Notes (Signed)
Patient transported to X-ray 

## 2014-07-02 ENCOUNTER — Ambulatory Visit
Admission: RE | Admit: 2014-07-02 | Payer: No Typology Code available for payment source | Source: Ambulatory Visit | Admitting: Radiation Oncology

## 2014-07-08 ENCOUNTER — Other Ambulatory Visit: Payer: Self-pay | Admitting: Physician Assistant

## 2014-07-08 ENCOUNTER — Telehealth: Payer: Self-pay | Admitting: Medical Oncology

## 2014-07-08 DIAGNOSIS — C169 Malignant neoplasm of stomach, unspecified: Secondary | ICD-10-CM

## 2014-07-08 MED ORDER — OXYCODONE-ACETAMINOPHEN 7.5-325 MG PO TABS: 1.0000 | ORAL_TABLET | Freq: Four times a day (QID) | ORAL | Status: DC | PRN

## 2014-07-08 NOTE — Telephone Encounter (Signed)
Prescription is ready for pick up -rx locked up. Pt wants to do scan in 3 months and then start on chemo pills after . " I want to give my body a rest after radiation". She cannot afford the megace. What else can he prescribe for appetite?

## 2014-07-08 NOTE — Telephone Encounter (Signed)
Pt reports increased back pain and requests lorazepam. 'It worked good". I told her that Lorazepam is not for pain management and  per Mohamed's note he ordered restoril and to stop ativan. The last pain med she had was #6 tablets of percocet from 4//29 -"they did not work well".

## 2014-07-09 ENCOUNTER — Other Ambulatory Visit: Payer: Self-pay | Admitting: Internal Medicine

## 2014-07-09 DIAGNOSIS — C169 Malignant neoplasm of stomach, unspecified: Secondary | ICD-10-CM

## 2014-07-10 ENCOUNTER — Telehealth: Payer: Self-pay | Admitting: *Deleted

## 2014-07-10 ENCOUNTER — Telehealth: Payer: Self-pay | Admitting: Internal Medicine

## 2014-07-10 DIAGNOSIS — R630 Anorexia: Secondary | ICD-10-CM

## 2014-07-10 MED ORDER — METHYLPREDNISOLONE 4 MG PO TBPK: ORAL_TABLET | ORAL | Status: DC

## 2014-07-10 NOTE — Telephone Encounter (Signed)
TC from pt this am regarding Megace. She cannot afford it ($548) and still has no appetite. She was wondering if steroids would help?

## 2014-07-10 NOTE — Telephone Encounter (Signed)
Pt notified. rx sent to local pharmacy.

## 2014-07-10 NOTE — Telephone Encounter (Signed)
-----   Message from Curt Bears, MD sent at 07/09/2014  5:41 PM EDT ----- Ok to order CT abdomen and pelvis in 3 months. It will be too late for her to resume chemo. Medrol dose pak for ap[petite. ----- Message -----    From: [name], RN    Sent: 07/08/2014   1:37 PM      To: Jeanella Craze, Rad Tech, Curt Bears, MD  F/U-Pt wants to do scan in 3 months and then start on chemo pills after . " I want to give my body a rest after radiation".   Poor appetite-She cannot afford the megace.   "I have lost so much weight , I am a size 4. What else can he prescribe for appetite?"

## 2014-07-10 NOTE — Telephone Encounter (Signed)
s.w. pt and advised on Aug appt....pt ok and aware °

## 2014-07-18 ENCOUNTER — Ambulatory Visit
Admission: RE | Admit: 2014-07-18 | Discharge: 2014-07-18 | Disposition: A | Discharge status: Home / Self Care | Payer: Medicaid Other | Source: Ambulatory Visit | Attending: Radiation Oncology | Admitting: Radiation Oncology

## 2014-07-18 VITALS — BP 110/72 | HR 66 | Temp 98.0°F | Resp 12 | Ht 64.0 in | Wt 119.9 lb

## 2014-07-18 DIAGNOSIS — C163 Malignant neoplasm of pyloric antrum: Secondary | ICD-10-CM

## 2014-07-18 NOTE — Progress Notes (Signed)
Paula [name] here for follow up.  She continues to have pain in her upper back which she rates a 7/10.  She is not taking any pain medication.  She reports having occasional nausea and is taking phenergan as needed.  She reports feeling weak and tired.  She denies having diarrhea and reports having a normal bowel movement every morning.  Her weight is down 1 lb from 06/20/14.  She reports having a poor appetite and was started on a medrol dose pak today by Dr. [name].    BP 110/72 mmHg  Pulse 66  Temp(Src) 98 F (36.7 C) (Oral)  Resp 12  Ht 5\' 4"  (1.626 m)  Wt 119 lb 14.4 oz (54.386 kg)  BMI 20.57 kg/m2  SpO2 100%  LMP 06/29/2014   Wt Readings from Last 3 Encounters:  07/18/14 119 lb 14.4 oz (54.386 kg)  07/01/14 119 lb (53.978 kg)  06/30/14 119 lb (53.978 kg)

## 2014-07-18 NOTE — Progress Notes (Signed)
Radiation Oncology         [phone]) [phone] ________________________________  Name: Paula [name] MRN: [id number]  Date: 07/18/2014  DOB: 1966-02-01  Follow-Up Visit Note  Outpatient  CC: Arnoldo Morale, MD  Curt Bears, MD  Diagnosis and Prior Radiotherapy: No diagnosis found. 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 She continues to have pain in her upper back which she rates a 7/10. She is not taking any pain medication. She reports having occasional nausea and is taking phenergan as needed. She reports feeling weak and tired. She denies having diarrhea and reports having a normal bowel movement every morning. Her weight is down 1 lb from 06/20/14. She reports having a poor appetite and was started on a medrol dose pak today by Dr. [name]. Pt states her period came back and she gets sick (nausea, etc) when it occurs. Dr. [name] discussed her options are to continue chemotherapy or opt for observation with a CT scan in 3 months.  She has chosen observation for now.                           ALLERGIES:  has No Known Allergies.  Meds: Current Outpatient Prescriptions  Medication Sig Dispense Refill  . carvedilol (COREG) 6.25 MG tablet Take 6.25 mg by mouth 2 (two) times daily with a meal.    . lidocaine-prilocaine (EMLA) cream Apply 1 application topically as needed. Apply to port 1 hour before chemo 30 g 0  . methylPREDNISolone (MEDROL DOSEPAK) 4 MG TBPK tablet Take per package instructions. To increase appetite. 21 tablet 0  . Multiple Vitamins-Minerals (MULTIVITAMIN & MINERAL PO) Take 1 tablet by mouth daily.    .  promethazine (PHENERGAN) 25 MG tablet Take 1 tablet (25 mg total) by mouth every 6 (six) hours as needed for nausea or vomiting. 30 tablet 0  . capecitabine (XELODA) 500 MG tablet Take 2 tablets (1,000 mg total) by mouth 2 (two) times daily after a meal. (Patient not taking: Reported on 07/18/2014) 120 tablet 1  . hyaluronate sodium (RADIAPLEXRX) GEL Apply 1 application topically 2 (two) times daily as needed (stomach).     . megestrol (MEGACE ES) 625 MG/5ML suspension Take 5 mLs (625 mg total) by mouth daily. (Patient not taking: Reported on 07/18/2014) 150 mL 0  . metoCLOPramide (REGLAN) 10 MG tablet Take 1 tablet (10 mg total) by mouth 2 (two) times daily. Then, stop taking this medication. (Patient not taking: Reported on 07/18/2014) 60 tablet 0  . ondansetron (ZOFRAN ODT) 8 MG disintegrating tablet 8mg  ODT q4 hours prn nausea (Patient not taking: Reported on 07/18/2014) 6 tablet 0  . OVER THE COUNTER MEDICATION     . oxyCODONE (OXY IR/ROXICODONE) 5 MG immediate release tablet Take 1 tablet (5 mg total) by mouth every 4 (four) hours as needed for severe pain. (Patient not taking: Reported on 07/18/2014) 45 tablet 0  . oxyCODONE-acetaminophen (PERCOCET) 7.5-325 MG per tablet Take 1 tablet by mouth every 6 (six) hours as needed for severe pain. (Patient not taking: Reported on 07/18/2014) 40  tablet 0  . pantoprazole (PROTONIX) 40 MG tablet Take 1 tablet (40 mg total) by mouth 2 (two) times daily before a meal. (Patient not taking: Reported on 07/18/2014) 60 tablet 3  . temazepam (RESTORIL) 30 MG capsule Take 1 capsule (30 mg total) by mouth at bedtime as needed for sleep. (Patient not taking: Reported on 07/18/2014) 30 capsule 0   No current facility-administered medications for this encounter.    Physical Findings: The patient is in no acute distress. Patient is alert and oriented.  height is 5\' 4"  (1.626 m) and weight is 119 lb 14.4 oz (54.386 kg). Her oral temperature is 98 F (36.7 C). Her blood  pressure is 110/72 and her pulse is 66. Her respiration is 12 and oxygen saturation is 100%.  Tired appearing. Abdomen tender, especially in LUQ, with no rigidity or guarding or distention. No significant skin irritation in RT fields.  Lab Findings: Lab Results  Component Value Date   WBC 5.4 07/01/2014   HGB 12.5 07/01/2014   HCT 37.8 07/01/2014   MCV 84.2 07/01/2014   PLT 274 07/01/2014    Radiographic Findings: Dg Abd Acute W/chest  07/01/2014   CLINICAL DATA:  Fever and vomiting since yesterday, some diarrhea this morning, history hypertension, stomach cancer, esophageal reflux  EXAM: DG ABDOMEN ACUTE W/ 1V CHEST  COMPARISON:  11/24/2013 chest radiograph, abdominal radiograph 02/17/2014  FINDINGS: RIGHT subclavian power port with tip projecting over SVC.  Normal heart size, mediastinal contours, and pulmonary vascularity.  Chronic changes at RIGHT lung base likely represents scarring, stable.  No acute infiltrate, pleural effusion or pneumothorax.  Surgical clips and anastomotic staple lines in LEFT upper quadrant.  Normal bowel gas pattern.  No bowel dilatation, bowel wall thickening or free intraperitoneal air.  Tubal ligation rings in pelvis.  Osseous structures unremarkable.  No urinary tract calcification.  IMPRESSION: RIGHT lateral basilar lung scarring.  No acute abdominal findings.   Electronically Signed   By: Lavonia Dana M.D.   On: 07/01/2014 10:03    Impression/Plan:  F/u with imaging as planned in medical oncology. Patient understands she is at considerable risk of recurrence given her diagnosis and suboptimal dosing of chemotherapy.  She understands that fatigue will take some time to lift.  We discussed nutrition, hydration, light exercise to help restore her energy.  We will arrange f/u in 5 mo in my department as well.   This document serves as a record of services personally performed by Eppie Gibson, MD. It was created on her behalf by Darcus Austin, a trained medical  scribe. The creation of this record is based on the scribe's personal observations and the provider's statements to them. This document has been checked and approved by the attending provider.     _____________________________________   Eppie Gibson, MD

## 2014-07-22 ENCOUNTER — Telehealth: Payer: Self-pay | Admitting: *Deleted

## 2014-07-22 NOTE — Telephone Encounter (Signed)
CALLED PATIENT TO INFORM OF FU WITH DR. Isidore Moos ON 12-19-14 @ 4:20 PM, SPOKE WITH PATIENT AND SHE IS AWARE OF THIS APPT.

## 2014-07-29 ENCOUNTER — Emergency Department (HOSPITAL_BASED_OUTPATIENT_CLINIC_OR_DEPARTMENT_OTHER)
Admission: EM | Admit: 2014-07-29 | Discharge: 2014-07-29 | Disposition: A | Discharge status: Home / Self Care | Payer: Medicaid Other | Attending: Emergency Medicine | Admitting: Emergency Medicine

## 2014-07-29 ENCOUNTER — Encounter (HOSPITAL_BASED_OUTPATIENT_CLINIC_OR_DEPARTMENT_OTHER): Payer: Self-pay | Admitting: Emergency Medicine

## 2014-07-29 DIAGNOSIS — Z8739 Personal history of other diseases of the musculoskeletal system and connective tissue: Secondary | ICD-10-CM | POA: Insufficient documentation

## 2014-07-29 DIAGNOSIS — Z9079 Acquired absence of other genital organ(s): Secondary | ICD-10-CM | POA: Diagnosis not present

## 2014-07-29 DIAGNOSIS — Z79899 Other long term (current) drug therapy: Secondary | ICD-10-CM | POA: Diagnosis not present

## 2014-07-29 DIAGNOSIS — G43909 Migraine, unspecified, not intractable, without status migrainosus: Secondary | ICD-10-CM | POA: Diagnosis not present

## 2014-07-29 DIAGNOSIS — I10 Essential (primary) hypertension: Secondary | ICD-10-CM | POA: Insufficient documentation

## 2014-07-29 DIAGNOSIS — R011 Cardiac murmur, unspecified: Secondary | ICD-10-CM | POA: Insufficient documentation

## 2014-07-29 DIAGNOSIS — Z9851 Tubal ligation status: Secondary | ICD-10-CM | POA: Diagnosis not present

## 2014-07-29 DIAGNOSIS — R1084 Generalized abdominal pain: Secondary | ICD-10-CM | POA: Diagnosis not present

## 2014-07-29 DIAGNOSIS — R6883 Chills (without fever): Secondary | ICD-10-CM | POA: Diagnosis not present

## 2014-07-29 DIAGNOSIS — R112 Nausea with vomiting, unspecified: Secondary | ICD-10-CM

## 2014-07-29 DIAGNOSIS — Z87891 Personal history of nicotine dependence: Secondary | ICD-10-CM | POA: Insufficient documentation

## 2014-07-29 DIAGNOSIS — K219 Gastro-esophageal reflux disease without esophagitis: Secondary | ICD-10-CM | POA: Insufficient documentation

## 2014-07-29 DIAGNOSIS — C169 Malignant neoplasm of stomach, unspecified: Secondary | ICD-10-CM | POA: Diagnosis not present

## 2014-07-29 DIAGNOSIS — R197 Diarrhea, unspecified: Secondary | ICD-10-CM | POA: Insufficient documentation

## 2014-07-29 DIAGNOSIS — R61 Generalized hyperhidrosis: Secondary | ICD-10-CM | POA: Insufficient documentation

## 2014-07-29 DIAGNOSIS — Z8742 Personal history of other diseases of the female genital tract: Secondary | ICD-10-CM | POA: Diagnosis not present

## 2014-07-29 DIAGNOSIS — Z3202 Encounter for pregnancy test, result negative: Secondary | ICD-10-CM | POA: Diagnosis not present

## 2014-07-29 LAB — CBC WITH DIFFERENTIAL/PLATELET
BASOS ABS: 0 10*3/uL (ref 0.0–0.1)
Basophils Relative: 0 % (ref 0–1)
EOS ABS: 0 10*3/uL (ref 0.0–0.7)
EOS PCT: 0 % (ref 0–5)
HCT: 35.9 % — ABNORMAL LOW (ref 36.0–46.0)
Hemoglobin: 11.5 g/dL — ABNORMAL LOW (ref 12.0–15.0)
LYMPHS ABS: 0.4 10*3/uL — AB (ref 0.7–4.0)
Lymphocytes Relative: 6 % — ABNORMAL LOW (ref 12–46)
MCH: 27.8 pg (ref 26.0–34.0)
MCHC: 32 g/dL (ref 30.0–36.0)
MCV: 86.7 fL (ref 78.0–100.0)
MONOS PCT: 1 % — AB (ref 3–12)
Monocytes Absolute: 0.1 10*3/uL (ref 0.1–1.0)
Neutro Abs: 6.9 10*3/uL (ref 1.7–7.7)
Neutrophils Relative %: 93 % — ABNORMAL HIGH (ref 43–77)
Platelets: 144 10*3/uL — ABNORMAL LOW (ref 150–400)
RBC: 4.14 MIL/uL (ref 3.87–5.11)
RDW: 14.9 % (ref 11.5–15.5)
WBC: 7.4 10*3/uL (ref 4.0–10.5)

## 2014-07-29 LAB — URINALYSIS, ROUTINE W REFLEX MICROSCOPIC
Bilirubin Urine: NEGATIVE
Glucose, UA: NEGATIVE mg/dL
Ketones, ur: 15 mg/dL — AB
NITRITE: NEGATIVE
PH: 5.5 (ref 5.0–8.0)
Protein, ur: NEGATIVE mg/dL
Specific Gravity, Urine: 1.024 (ref 1.005–1.030)
Urobilinogen, UA: 0.2 mg/dL (ref 0.0–1.0)

## 2014-07-29 LAB — COMPREHENSIVE METABOLIC PANEL
ALK PHOS: 64 U/L (ref 38–126)
ALT: 20 U/L (ref 14–54)
ANION GAP: 8 (ref 5–15)
AST: 23 U/L (ref 15–41)
Albumin: 3.6 g/dL (ref 3.5–5.0)
BILIRUBIN TOTAL: 0.5 mg/dL (ref 0.3–1.2)
BUN: 8 mg/dL (ref 6–20)
CHLORIDE: 113 mmol/L — AB (ref 101–111)
CO2: 21 mmol/L — ABNORMAL LOW (ref 22–32)
Calcium: 7.8 mg/dL — ABNORMAL LOW (ref 8.9–10.3)
Creatinine, Ser: 0.57 mg/dL (ref 0.44–1.00)
GFR calc Af Amer: >60 mL/min (ref >60)
GFR calc non Af Amer: >60 mL/min (ref >60)
Glucose, Bld: 170 mg/dL — ABNORMAL HIGH (ref 65–99)
POTASSIUM: 3.2 mmol/L — AB (ref 3.5–5.1)
Sodium: 142 mmol/L (ref 135–145)
TOTAL PROTEIN: 6.3 g/dL — AB (ref 6.5–8.1)

## 2014-07-29 LAB — URINE MICROSCOPIC-ADD ON

## 2014-07-29 LAB — PREGNANCY, URINE: Preg Test, Ur: NEGATIVE

## 2014-07-29 LAB — LIPASE, BLOOD: Lipase: <10 U/L — ABNORMAL LOW (ref 22–51)

## 2014-07-29 MED ORDER — POTASSIUM CHLORIDE 10 MEQ/100ML IV SOLN
10.0000 meq | Freq: Once | INTRAVENOUS | Status: AC
  Administered 2014-07-29 (×2): 10 meq via INTRAVENOUS
  Filled 2014-07-29: qty 100

## 2014-07-29 MED ORDER — SODIUM CHLORIDE 0.9 % IV BOLUS (SEPSIS)
500.0000 mL | Freq: Once | INTRAVENOUS | Status: AC
  Administered 2014-07-29: 500 mL via INTRAVENOUS

## 2014-07-29 MED ORDER — HYDROMORPHONE HCL 1 MG/ML IJ SOLN
1.0000 mg | Freq: Once | INTRAMUSCULAR | Status: AC
  Administered 2014-07-29: 1 mg via INTRAVENOUS
  Filled 2014-07-29: qty 1

## 2014-07-29 MED ORDER — ONDANSETRON HCL 4 MG PO TABS: 4.0000 mg | ORAL_TABLET | Freq: Four times a day (QID) | ORAL | Status: DC

## 2014-07-29 MED ORDER — POTASSIUM CHLORIDE ER 10 MEQ PO TBCR: 10.0000 meq | EXTENDED_RELEASE_TABLET | Freq: Every day | ORAL | Status: DC

## 2014-07-29 MED ORDER — ONDANSETRON HCL 4 MG/2ML IJ SOLN
4.0000 mg | Freq: Once | INTRAMUSCULAR | Status: AC
  Administered 2014-07-29: 4 mg via INTRAVENOUS
  Filled 2014-07-29: qty 2

## 2014-07-29 NOTE — ED Notes (Signed)
Placed Pt. On Cardiac monitor due to K+ infusion.  Pt. Is SR with oc PVC  Noted on monitor .  Pt. In no distress  Resting with eyes closed.

## 2014-07-29 NOTE — Discharge Instructions (Signed)
Nausea and Vomiting Return for any fever, chills, vomiting with inability to hold down liquids, bloody bowel movements. Nausea is a sick feeling that often comes before throwing up (vomiting). Vomiting is a reflex where stomach contents come out of your mouth. Vomiting can cause severe loss of body fluids (dehydration). Children and elderly adults can become dehydrated quickly, especially if they also have diarrhea. Nausea and vomiting are symptoms of a condition or disease. It is important to find the cause of your symptoms. CAUSES   Direct irritation of the stomach lining. This irritation can result from increased acid production (gastroesophageal reflux disease), infection, food poisoning, taking certain medicines (such as nonsteroidal anti-inflammatory drugs), alcohol use, or tobacco use.  Signals from the brain.These signals could be caused by a headache, heat exposure, an inner ear disturbance, increased pressure in the brain from injury, infection, a tumor, or a concussion, pain, emotional stimulus, or metabolic problems.  An obstruction in the gastrointestinal tract (bowel obstruction).  Illnesses such as diabetes, hepatitis, gallbladder problems, appendicitis, kidney problems, cancer, sepsis, atypical symptoms of a heart attack, or eating disorders.  Medical treatments such as chemotherapy and radiation.  Receiving medicine that makes you sleep (general anesthetic) during surgery. DIAGNOSIS Your caregiver may ask for tests to be done if the problems do not improve after a few days. Tests may also be done if symptoms are severe or if the reason for the nausea and vomiting is not clear. Tests may include:  Urine tests.  Blood tests.  Stool tests.  Cultures (to look for evidence of infection).  X-rays or other imaging studies. Test results can help your caregiver make decisions about treatment or the need for additional tests. TREATMENT You need to stay well hydrated. Drink  frequently but in small amounts.You may wish to drink water, sports drinks, clear broth, or eat frozen ice pops or gelatin dessert to help stay hydrated.When you eat, eating slowly may help prevent nausea.There are also some antinausea medicines that may help prevent nausea. HOME CARE INSTRUCTIONS   Take all medicine as directed by your caregiver.  If you do not have an appetite, do not force yourself to eat. However, you must continue to drink fluids.  If you have an appetite, eat a normal diet unless your caregiver tells you differently.  Eat a variety of complex carbohydrates (rice, wheat, potatoes, bread), lean meats, yogurt, fruits, and vegetables.  Avoid high-fat foods because they are more difficult to digest.  Drink enough water and fluids to keep your urine clear or pale yellow.  If you are dehydrated, ask your caregiver for specific rehydration instructions. Signs of dehydration may include:  Severe thirst.  Dry lips and mouth.  Dizziness.  Dark urine.  Decreasing urine frequency and amount.  Confusion.  Rapid breathing or pulse. SEEK IMMEDIATE MEDICAL CARE IF:   You have blood or brown flecks (like coffee grounds) in your vomit.  You have black or bloody stools.  You have a severe headache or stiff neck.  You are confused.  You have severe abdominal pain.  You have chest pain or trouble breathing.  You do not urinate at least once every 8 hours.  You develop cold or clammy skin.  You continue to vomit for longer than 24 to 48 hours.  You have a fever. MAKE SURE YOU:   Understand these instructions.  Will watch your condition.  Will get help right away if you are not doing well or get worse. Document Released: 02/07/2005 Document Revised:  05/02/2011 Document Reviewed: 07/07/2010 ExitCare Patient Information 2015 [place], [place]. This information is not intended to replace advice given to you by your health care provider. Make sure you discuss  any questions you have with your health care provider.

## 2014-07-29 NOTE — ED Notes (Signed)
Pt c/o abd pain since 9pm last night.  Pt reports n/v since pain started last night.

## 2014-07-29 NOTE — ED Provider Notes (Signed)
CSN: [id number]     Arrival date & time 07/29/14  1301 History   First MD Initiated Contact with Patient 07/29/14 1307     Chief Complaint  Patient presents with  . Abdominal Pain     (Consider location/radiation/quality/duration/timing/severity/associated sxs/prior Treatment) Patient is a 49 y.o. female presenting with abdominal pain. The history is provided by the patient. No language interpreter was used.  Abdominal Pain Associated symptoms: chills, diarrhea, nausea and vomiting   Associated symptoms: no constipation and no fever   Paula [name] is a 49 y.o female with a history of gout for left oophorectomy, hypertension, gastric cancer who was treated with chemotherapy and radiation. Her last chemotherapy treatment was 3 months ago and her last radiation was April 18. She states she has had abdominal pain and vomiting since 9 PM last night. The pain is generalized. He states she has also had chills. States that she has had the same symptoms in the past when starting her menstrual cycle. She is currently on her menstrual cycle now. She denies any chest pain, shortness of breath, cough,, hematemesis, hematochezia, constipation, lower extremity edema. Oncologist: Eppie Gibson Past Medical History  Diagnosis Date  . Headache(784.0) 2011    Migraines  . Esophageal reflux     On omeprazole  . Costochondritis   . Hypertension     started around 2011  . Ovarian cyst   . Stomach cancer   . Family history of cancer   . Heart murmur   . On antineoplastic chemotherapy     5-FU and leucovorin   Past Surgical History  Procedure Laterality Date  . Oophorectomy      Around 1985 left ovary removal  . Tonsillectomy      Around 1994  . Tubal ligation    . Esophagogastroduodenoscopy N/A 09/16/2013    Procedure: ESOPHAGOGASTRODUODENOSCOPY (EGD);  Surgeon: Wonda Horner, MD;  Location: [facility];  Service: Endoscopy;  Laterality: N/A;  . Laparoscopy N/A 09/20/2013    Procedure: DIAGNOSTIC  LAPAROSCOPY,DISTAL GASTRECTOMY AND FEEDING GASTROJEJUNOSTOMY;  Surgeon: [name], MD;  Location: [facility];  Service: General;  Laterality: N/A;  . Portacath placement Left 11/18/2013    Procedure: INSERTION PORT-A-CATH;  Surgeon: [name], MD;  Location: [facility];  Service: General;  Laterality: Left;   Family History  Problem Relation Age of Onset  . Hypertension Mother   . Diabetes Mellitus II Mother   . Hypertension Sister   . Hypertension Brother   . Heart failure Maternal Grandmother   . Hypertension Maternal Grandfather   . Prostate cancer Paternal Uncle     living at 90  . Gastric cancer Cousin 20    pat first cousin; daughter of pat uncle living with prostate ca   . Gastric cancer Cousin     mat first cousin; daughter of mat aunt who died in 102s due to heart issues  . Prostate cancer Paternal Uncle     deceased  . Prostate cancer Paternal Uncle     deceased  . Lung cancer Paternal Uncle     smoker; deceased   History  Substance Use Topics  . Smoking status: Former Research scientist (life sciences)  . Smokeless tobacco: Never Used     Comment: Stopped 2004  . Alcohol Use: No   OB History    No data available     Review of Systems  Constitutional: Positive for chills and diaphoresis. Negative for fever.  Gastrointestinal: Positive for nausea, vomiting, abdominal pain and diarrhea. Negative for constipation, blood  in stool and abdominal distention.  Neurological: Negative for dizziness, syncope and headaches.  All other systems reviewed and are negative.     Allergies  Review of patient's allergies indicates no known allergies.  Home Medications   Prior to Admission medications   Medication Sig Start Date End Date Taking? Authorizing Provider  capecitabine (XELODA) 500 MG tablet Take 2 tablets (1,000 mg total) by mouth 2 (two) times daily after a meal. Patient not taking: Reported on 07/18/2014 05/12/14   Curt Bears, MD  carvedilol (COREG) 6.25 MG tablet Take 6.25 mg by mouth 2  (two) times daily with a meal.    Historical Provider, MD  hyaluronate sodium (RADIAPLEXRX) GEL Apply 1 application topically 2 (two) times daily as needed (stomach).     Historical Provider, MD  lidocaine-prilocaine (EMLA) cream Apply 1 application topically as needed. Apply to port 1 hour before chemo 11/13/13   Curt Bears, MD  megestrol (MEGACE ES) 625 MG/5ML suspension Take 5 mLs (625 mg total) by mouth daily. Patient not taking: Reported on 07/18/2014 06/30/14   Curt Bears, MD  methylPREDNISolone (MEDROL DOSEPAK) 4 MG TBPK tablet Take per package instructions. To increase appetite. 07/10/14   Curt Bears, MD  metoCLOPramide (REGLAN) 10 MG tablet Take 1 tablet (10 mg total) by mouth 2 (two) times daily. Then, stop taking this medication. Patient not taking: Reported on 07/18/2014 06/16/14   Eppie Gibson, MD  Multiple Vitamins-Minerals (MULTIVITAMIN & MINERAL PO) Take 1 tablet by mouth daily.    Historical Provider, MD  ondansetron (ZOFRAN ODT) 8 MG disintegrating tablet 8mg  ODT q4 hours prn nausea Patient not taking: Reported on 07/18/2014 06/19/14   Veryl Speak, MD  OVER THE COUNTER MEDICATION     Historical Provider, MD  oxyCODONE (OXY IR/ROXICODONE) 5 MG immediate release tablet Take 1 tablet (5 mg total) by mouth every 4 (four) hours as needed for severe pain. Patient not taking: Reported on 07/18/2014 02/17/14   Maryanna Shape, NP  oxyCODONE-acetaminophen (PERCOCET) 7.5-325 MG per tablet Take 1 tablet by mouth every 6 (six) hours as needed for severe pain. Patient not taking: Reported on 07/18/2014 07/08/14   Carlton Adam, PA-C  pantoprazole (PROTONIX) 40 MG tablet Take 1 tablet (40 mg total) by mouth 2 (two) times daily before a meal. Patient not taking: Reported on 07/18/2014 01/10/14   Curt Bears, MD  promethazine (PHENERGAN) 25 MG tablet Take 1 tablet (25 mg total) by mouth every 6 (six) hours as needed for nausea or vomiting. 07/01/14   Dorie Rank, MD  temazepam (RESTORIL)  30 MG capsule Take 1 capsule (30 mg total) by mouth at bedtime as needed for sleep. Patient not taking: Reported on 07/18/2014 06/30/14   Curt Bears, MD   BP 164/82 mmHg  Pulse 54  Temp(Src) 97.3 F (36.3 C) (Oral)  Resp 19  Ht 5' (1.524 m)  Wt 115 lb (52.164 kg)  BMI 22.46 kg/m2  SpO2 98%  LMP 07/29/2014 Physical Exam  Constitutional: She is oriented to person, place, and time. Vital signs are normal. She appears well-developed. She has a sickly appearance. She appears ill. She appears distressed.  Paula [name] appears mildly distressed and sickly appearing. She is thin.  HENT:  Head: Normocephalic and atraumatic.  Eyes: Conjunctivae are normal.  Neck: Normal range of motion.  Cardiovascular: Normal rate, regular rhythm and normal heart sounds.   Pulmonary/Chest: Effort normal and breath sounds normal. No respiratory distress. She has no wheezes. She has no rales.  Abdominal: Soft.  She exhibits no distension and no mass. There is generalized tenderness. There is no CVA tenderness.  Musculoskeletal: Normal range of motion. She exhibits no edema.  Neurological: She is alert and oriented to person, place, and time.  Skin: Skin is warm and dry.    ED Course  Procedures (including critical care time) Labs Review Labs Reviewed  CBC WITH DIFFERENTIAL/PLATELET - Abnormal; Notable for the following:    Hemoglobin 11.5 (*)    HCT 35.9 (*)    Platelets 144 (*)    Neutrophils Relative % 93 (*)    Lymphocytes Relative 6 (*)    Monocytes Relative 1 (*)    Lymphs Abs 0.4 (*)    All other components within normal limits  COMPREHENSIVE METABOLIC PANEL - Abnormal; Notable for the following:    Potassium 3.2 (*)    Chloride 113 (*)    CO2 21 (*)    Glucose, Bld 170 (*)    Calcium 7.8 (*)    Total Protein 6.3 (*)    All other components within normal limits  LIPASE, BLOOD - Abnormal; Notable for the following:    Lipase <10 (*)    All other components within normal limits  URINALYSIS,  ROUTINE W REFLEX MICROSCOPIC (NOT AT [facility])  PREGNANCY, URINE    Imaging Review No results found.   EKG Interpretation None      MDM   Final diagnoses:  Nausea and vomiting in adult  Abdominal pain, generalized  Patient with a history of gastric cancer presents for generalized abdominal pain, nausea, vomiting, diarrhea since 9 PM last night. She is currently on her menstrual cycle. She states that she is followed closely by her oncologist. Her last radiation treatment was April 18. She is scheduled to have a CT scan in August by her oncologist and further discussion of whether or not they will need to start chemotherapy again at that time. He states her symptoms are similar to what she has had in the past with her menstrual cycle. She is currently afebrile and her labs are not concerning. She is mildly anemic and has been in the past. She is mildly hypokalemic which was replaced in the ED. Medications  HYDROmorphone (DILAUDID) injection 1 mg (1 mg Intravenous Given 07/29/14 1324)  ondansetron (ZOFRAN) injection 4 mg (4 mg Intravenous Given 07/29/14 1322)  sodium chloride 0.9 % bolus 500 mL (500 mLs Intravenous New Bag/Given 07/29/14 1327)  potassium chloride 10 mEq in 100 mL IVPB (10 mEq Intravenous New Bag/Given 07/29/14 1437)  HYDROmorphone (DILAUDID) injection 1 mg (1 mg Intravenous Given 07/29/14 1426)  ondansetron (ZOFRAN) injection 4 mg (4 mg Intravenous Given 07/29/14 1426)   After pain medication and Zofran she states she is feeling much better. She is tolerating PO fluids. Discussed this patient with Dr. Nigel Berthold who has seen the patient and agrees to discharge the patient home. 14:40 Upon recheck the patient appears to be pain-free, she is no longer nauseated, she states she feels much better and would like to go home. She refused an in and out cath for urine sample. She states she is on her menstrual cycle right now and does not feel comfortable with the procedure. 17:06 patient was  able to provide urine sample and does not have a UTI. She can follow-up with her PCP in 2 days. I have given her Zofran and potassium. She verbally agrees with the plan.    Ottie Glazier, PA-C 07/29/14 [place], MD 07/31/14 1057

## 2014-07-31 ENCOUNTER — Encounter (HOSPITAL_BASED_OUTPATIENT_CLINIC_OR_DEPARTMENT_OTHER): Payer: Self-pay

## 2014-07-31 ENCOUNTER — Emergency Department (HOSPITAL_BASED_OUTPATIENT_CLINIC_OR_DEPARTMENT_OTHER)
Admission: EM | Admit: 2014-07-31 | Discharge: 2014-07-31 | Disposition: A | Discharge status: Home / Self Care | Payer: Medicaid Other | Attending: Emergency Medicine | Admitting: Emergency Medicine

## 2014-07-31 ENCOUNTER — Emergency Department (HOSPITAL_BASED_OUTPATIENT_CLINIC_OR_DEPARTMENT_OTHER): Payer: Medicaid Other

## 2014-07-31 ENCOUNTER — Other Ambulatory Visit: Payer: Self-pay

## 2014-07-31 ENCOUNTER — Telehealth: Payer: Self-pay | Admitting: *Deleted

## 2014-07-31 DIAGNOSIS — R1084 Generalized abdominal pain: Secondary | ICD-10-CM | POA: Diagnosis not present

## 2014-07-31 DIAGNOSIS — Z87891 Personal history of nicotine dependence: Secondary | ICD-10-CM | POA: Diagnosis not present

## 2014-07-31 DIAGNOSIS — K219 Gastro-esophageal reflux disease without esophagitis: Secondary | ICD-10-CM | POA: Diagnosis not present

## 2014-07-31 DIAGNOSIS — R6883 Chills (without fever): Secondary | ICD-10-CM | POA: Diagnosis not present

## 2014-07-31 DIAGNOSIS — G43909 Migraine, unspecified, not intractable, without status migrainosus: Secondary | ICD-10-CM | POA: Insufficient documentation

## 2014-07-31 DIAGNOSIS — Z79899 Other long term (current) drug therapy: Secondary | ICD-10-CM | POA: Diagnosis not present

## 2014-07-31 DIAGNOSIS — R5383 Other fatigue: Secondary | ICD-10-CM | POA: Diagnosis not present

## 2014-07-31 DIAGNOSIS — R079 Chest pain, unspecified: Secondary | ICD-10-CM

## 2014-07-31 DIAGNOSIS — M546 Pain in thoracic spine: Secondary | ICD-10-CM | POA: Insufficient documentation

## 2014-07-31 DIAGNOSIS — R000 Tachycardia, unspecified: Secondary | ICD-10-CM | POA: Insufficient documentation

## 2014-07-31 DIAGNOSIS — C169 Malignant neoplasm of stomach, unspecified: Secondary | ICD-10-CM | POA: Insufficient documentation

## 2014-07-31 DIAGNOSIS — R112 Nausea with vomiting, unspecified: Secondary | ICD-10-CM

## 2014-07-31 DIAGNOSIS — I10 Essential (primary) hypertension: Secondary | ICD-10-CM | POA: Insufficient documentation

## 2014-07-31 DIAGNOSIS — R011 Cardiac murmur, unspecified: Secondary | ICD-10-CM | POA: Diagnosis not present

## 2014-07-31 DIAGNOSIS — Z8742 Personal history of other diseases of the female genital tract: Secondary | ICD-10-CM | POA: Diagnosis not present

## 2014-07-31 DIAGNOSIS — R61 Generalized hyperhidrosis: Secondary | ICD-10-CM | POA: Insufficient documentation

## 2014-07-31 LAB — CBC WITH DIFFERENTIAL/PLATELET
BASOS ABS: 0 10*3/uL (ref 0.0–0.1)
Basophils Relative: 0 % (ref 0–1)
EOS PCT: 0 % (ref 0–5)
Eosinophils Absolute: 0 10*3/uL (ref 0.0–0.7)
HCT: 38.4 % (ref 36.0–46.0)
HEMOGLOBIN: 12.7 g/dL (ref 12.0–15.0)
LYMPHS PCT: 10 % — AB (ref 12–46)
Lymphs Abs: 0.5 10*3/uL — ABNORMAL LOW (ref 0.7–4.0)
MCH: 28 pg (ref 26.0–34.0)
MCHC: 33.1 g/dL (ref 30.0–36.0)
MCV: 84.6 fL (ref 78.0–100.0)
Monocytes Absolute: 0.4 10*3/uL (ref 0.1–1.0)
Monocytes Relative: 8 % (ref 3–12)
Neutro Abs: 4.3 10*3/uL (ref 1.7–7.7)
Neutrophils Relative %: 82 % — ABNORMAL HIGH (ref 43–77)
Platelets: 220 10*3/uL (ref 150–400)
RBC: 4.54 MIL/uL (ref 3.87–5.11)
RDW: 14.6 % (ref 11.5–15.5)
WBC: 5.3 10*3/uL (ref 4.0–10.5)

## 2014-07-31 LAB — COMPREHENSIVE METABOLIC PANEL
ALBUMIN: 4.9 g/dL (ref 3.5–5.0)
ALK PHOS: 80 U/L (ref 38–126)
ALT: 30 U/L (ref 14–54)
ANION GAP: 11 (ref 5–15)
AST: 29 U/L (ref 15–41)
BUN: 6 mg/dL (ref 6–20)
CALCIUM: 9.7 mg/dL (ref 8.9–10.3)
CO2: 26 mmol/L (ref 22–32)
Chloride: 94 mmol/L — ABNORMAL LOW (ref 101–111)
Creatinine, Ser: 0.64 mg/dL (ref 0.44–1.00)
Glucose, Bld: 115 mg/dL — ABNORMAL HIGH (ref 65–99)
Potassium: 3.6 mmol/L (ref 3.5–5.1)
Sodium: 131 mmol/L — ABNORMAL LOW (ref 135–145)
TOTAL PROTEIN: 8.3 g/dL — AB (ref 6.5–8.1)
Total Bilirubin: 0.9 mg/dL (ref 0.3–1.2)

## 2014-07-31 LAB — TROPONIN I: Troponin I: <0.03 ng/mL (ref <0.031)

## 2014-07-31 MED ORDER — GI COCKTAIL ~~LOC~~
30.0000 mL | Freq: Once | ORAL | Status: AC
  Administered 2014-07-31: 30 mL via ORAL
  Filled 2014-07-31: qty 30

## 2014-07-31 MED ORDER — HYDROMORPHONE HCL 1 MG/ML IJ SOLN
1.0000 mg | Freq: Once | INTRAMUSCULAR | Status: AC
Start: 2014-07-31 — End: 2014-07-31
  Administered 2014-07-31: 1 mg via INTRAVENOUS
  Filled 2014-07-31: qty 1

## 2014-07-31 MED ORDER — IOHEXOL 350 MG/ML SOLN
100.0000 mL | Freq: Once | INTRAVENOUS | Status: AC | PRN
Start: 2014-07-31 — End: 2014-07-31
  Administered 2014-07-31: 100 mL via INTRAVENOUS

## 2014-07-31 MED ORDER — HYDROMORPHONE HCL 1 MG/ML IJ SOLN
0.5000 mg | Freq: Once | INTRAMUSCULAR | Status: AC
  Administered 2014-07-31: 0.5 mg via INTRAVENOUS
  Filled 2014-07-31: qty 1

## 2014-07-31 MED ORDER — PROMETHAZINE HCL 25 MG/ML IJ SOLN
25.0000 mg | Freq: Once | INTRAMUSCULAR | Status: AC
  Administered 2014-07-31: 25 mg via INTRAVENOUS
  Filled 2014-07-31: qty 1

## 2014-07-31 MED ORDER — SODIUM CHLORIDE 0.9 % IV BOLUS (SEPSIS)
1000.0000 mL | Freq: Once | INTRAVENOUS | Status: AC
  Administered 2014-07-31: 1000 mL via INTRAVENOUS

## 2014-07-31 NOTE — ED Notes (Signed)
Pt given phenergan, afterwards, pts HR noted to be 145. EDP Hess made aware. Verbal order for 1 mg dilaudid. Medication given. Pts HR noted to be 120 after resting.

## 2014-07-31 NOTE — ED Notes (Signed)
PA at bedside.

## 2014-07-31 NOTE — Telephone Encounter (Signed)
She will probably benefit from an earlier appointment with the gastroenterologist to evaluate her nausea rather than me.

## 2014-07-31 NOTE — ED Notes (Signed)
Patient wheelchaired to room 1, Patient placed in a gown and given a blanket.

## 2014-07-31 NOTE — Telephone Encounter (Signed)
VM message from patient stating she has been having ongoing nausea and vomiting, chest pain that resulted in 2 visits to ED this week.  Currently pt is again in ED with similar symptoms. It appears that pt will be discharged back home after IVFs and pain meds. See ED notes. Pt is requesting earlier appt with Dr. [name]. Currently scheduled for 10/15/14

## 2014-07-31 NOTE — Discharge Instructions (Signed)
Chest Pain (Nonspecific) °It is often hard to give a specific diagnosis for the cause of chest pain. There is always a chance that your pain could be related to something serious, such as a heart attack or a blood clot in the lungs. You need to follow up with your health care provider for further evaluation. °CAUSES  °· Heartburn. °· Pneumonia or bronchitis. °· Anxiety or stress. °· Inflammation around your heart (pericarditis) or lung (pleuritis or pleurisy). °· A blood clot in the lung. °· A collapsed lung (pneumothorax). It can develop suddenly on its own (spontaneous pneumothorax) or from trauma to the chest. °· Shingles infection (herpes zoster virus). °The chest wall is composed of bones, muscles, and cartilage. Any of these can be the source of the pain. °· The bones can be bruised by injury. °· The muscles or cartilage can be strained by coughing or overwork. °· The cartilage can be affected by inflammation and become sore (costochondritis). °DIAGNOSIS  °Lab tests or other studies may be needed to find the cause of your pain. Your health care provider may have you take a test called an ambulatory electrocardiogram (ECG). An ECG records your heartbeat patterns over a 24-hour period. You may also have other tests, such as: °· Transthoracic echocardiogram (TTE). During echocardiography, sound waves are used to evaluate how blood flows through your heart. °· Transesophageal echocardiogram (TEE). °· Cardiac monitoring. This allows your health care provider to monitor your heart rate and rhythm in real time. °· Holter monitor. This is a portable device that records your heartbeat and can help diagnose heart arrhythmias. It allows your health care provider to track your heart activity for several days, if needed. °· Stress tests by exercise or by giving medicine that makes the heart beat faster. °TREATMENT  °· Treatment depends on what may be causing your chest pain. Treatment may include: °· Acid blockers for  heartburn. °· Anti-inflammatory medicine. °· Pain medicine for inflammatory conditions. °· Antibiotics if an infection is present. °· You may be advised to change lifestyle habits. This includes stopping smoking and avoiding alcohol, caffeine, and chocolate. °· You may be advised to keep your head raised (elevated) when sleeping. This reduces the chance of acid going backward from your stomach into your esophagus. °Most of the time, nonspecific chest pain will improve within 2-3 days with rest and mild pain medicine.  °HOME CARE INSTRUCTIONS  °· If antibiotics were prescribed, take them as directed. Finish them even if you start to feel better. °· For the next few days, avoid physical activities that bring on chest pain. Continue physical activities as directed. °· Do not use any tobacco products, including cigarettes, chewing tobacco, or electronic cigarettes. °· Avoid drinking alcohol. °· Only take medicine as directed by your health care provider. °· Follow your health care provider's suggestions for further testing if your chest pain does not go away. °· Keep any follow-up appointments you made. If you do not go to an appointment, you could develop lasting (chronic) problems with pain. If there is any problem keeping an appointment, call to reschedule. °SEEK MEDICAL CARE IF:  °· Your chest pain does not go away, even after treatment. °· You have a rash with blisters on your chest. °· You have a fever. °SEEK IMMEDIATE MEDICAL CARE IF:  °· You have increased chest pain or pain that spreads to your arm, neck, jaw, back, or abdomen. °· You have shortness of breath. °· You have an increasing cough, or you cough   up blood. °· You have severe back or abdominal pain. °· You feel nauseous or vomit. °· You have severe weakness. °· You faint. °· You have chills. °This is an emergency. Do not wait to see if the pain will go away. Get medical help at once. Call your local emergency services (911 in [place] Do not drive  yourself to the hospital. °MAKE SURE YOU:  °· Understand these instructions. °· Will watch your condition. °· Will get help right away if you are not doing well or get worse. °Document Released: 11/17/2004 Document Revised: 02/12/2013 Document Reviewed: 09/13/2007 °ExitCare® Patient Information ©2015 [employer]. This information is not intended to replace advice given to you by your health care provider. Make sure you discuss any questions you have with your health care provider. ° ° ° °Nausea and Vomiting °Nausea is a sick feeling that often comes before throwing up (vomiting). Vomiting is a reflex where stomach contents come out of your mouth. Vomiting can cause severe loss of body fluids (dehydration). Children and elderly adults can become dehydrated quickly, especially if they also have diarrhea. Nausea and vomiting are symptoms of a condition or disease. It is important to find the cause of your symptoms. °CAUSES  °· Direct irritation of the stomach lining. This irritation can result from increased acid production (gastroesophageal reflux disease), infection, food poisoning, taking certain medicines (such as nonsteroidal anti-inflammatory drugs), alcohol use, or tobacco use. °· Signals from the brain. These signals could be caused by a headache, heat exposure, an inner ear disturbance, increased pressure in the brain from injury, infection, a tumor, or a concussion, pain, emotional stimulus, or metabolic problems. °· An obstruction in the gastrointestinal tract (bowel obstruction). °· Illnesses such as diabetes, hepatitis, gallbladder problems, appendicitis, kidney problems, cancer, sepsis, atypical symptoms of a heart attack, or eating disorders. °· Medical treatments such as chemotherapy and radiation. °· Receiving medicine that makes you sleep (general anesthetic) during surgery. °DIAGNOSIS °Your caregiver may ask for tests to be done if the problems do not improve after a few days. Tests may also be  done if symptoms are severe or if the reason for the nausea and vomiting is not clear. Tests may include: °· Urine tests. °· Blood tests. °· Stool tests. °· Cultures (to look for evidence of infection). °· X-rays or other imaging studies. °Test results can help your caregiver make decisions about treatment or the need for additional tests. °TREATMENT °You need to stay well hydrated. Drink frequently but in small amounts. You may wish to drink water, sports drinks, clear broth, or eat frozen ice pops or gelatin dessert to help stay hydrated. When you eat, eating slowly may help prevent nausea. There are also some antinausea medicines that may help prevent nausea. °HOME CARE INSTRUCTIONS  °· Take all medicine as directed by your caregiver. °· If you do not have an appetite, do not force yourself to eat. However, you must continue to drink fluids. °· If you have an appetite, eat a normal diet unless your caregiver tells you differently. °¨ Eat a variety of complex carbohydrates (rice, wheat, potatoes, bread), lean meats, yogurt, fruits, and vegetables. °¨ Avoid high-fat foods because they are more difficult to digest. °· Drink enough water and fluids to keep your urine clear or pale yellow. °· If you are dehydrated, ask your caregiver for specific rehydration instructions. Signs of dehydration may include: °¨ Severe thirst. °¨ Dry lips and mouth. °¨ Dizziness. °¨ Dark urine. °¨ Decreasing urine frequency and amount. °¨ Confusion. °¨ Rapid   breathing or pulse. °SEEK IMMEDIATE MEDICAL CARE IF:  °· You have blood or brown flecks (like coffee grounds) in your vomit. °· You have black or bloody stools. °· You have a severe headache or stiff neck. °· You are confused. °· You have severe abdominal pain. °· You have chest pain or trouble breathing. °· You do not urinate at least once every 8 hours. °· You develop cold or clammy skin. °· You continue to vomit for longer than 24 to 48 hours. °· You have a fever. °MAKE SURE YOU:    °· Understand these instructions. °· Will watch your condition. °· Will get help right away if you are not doing well or get worse. °Document Released: 02/07/2005 Document Revised: 05/02/2011 Document Reviewed: 07/07/2010 °ExitCare® Patient Information ©2015 [employer]. This information is not intended to replace advice given to you by your health care provider. Make sure you discuss any questions you have with your health care provider. ° °

## 2014-07-31 NOTE — ED Notes (Signed)
Pt transported to CT ?

## 2014-07-31 NOTE — ED Notes (Signed)
C/o CP since yesterday

## 2014-07-31 NOTE — ED Provider Notes (Signed)
CSN: [id number]     Arrival date & time 07/31/14  1059 History   First MD Initiated Contact with Patient 07/31/14 1207     Chief Complaint  Patient presents with  . Chest Pain     (Consider location/radiation/quality/duration/timing/severity/associated sxs/prior Treatment) HPI Comments:  49 year old female complaining of ongoing chest pain 4 days, worsening yesterday. She was seen in the ED 3 days ago for abdominal pain, nausea, vomiting and diarrhea , and states she had chest pain at that time. Pain is located beneath her left breast, radiating through to the left side of her upper back, worse with vomiting. No alleviating factors. Denies shortness of breath or cough. Admits to continuous dry heaving , nausea, and multiple episodes of nonbloody emesis. Denies any further diarrhea. Reports she is still having abdominal pain, which is noted is chronic from a history of stomach cancer. Denies fevers but states she has been getting cold sweats alternating with being very hot. Last chemotherapy session was in March, had a radiation session in April, and will be beginning chemotherapy again in August. Pt continuously requesting dilaudid throughout this encounter, and requesting to get it "ASAP".  Patient is a 49 y.o. female presenting with chest pain. The history is provided by the patient and the spouse.  Chest Pain Associated symptoms: abdominal pain, back pain, diaphoresis, fatigue, nausea and vomiting     Past Medical History  Diagnosis Date  . Headache(784.0) 2011    Migraines  . Esophageal reflux     On omeprazole  . Costochondritis   . Hypertension     started around 2011  . Ovarian cyst   . Stomach cancer   . Family history of cancer   . Heart murmur   . On antineoplastic chemotherapy     5-FU and leucovorin   Past Surgical History  Procedure Laterality Date  . Oophorectomy      Around 1985 left ovary removal  . Tonsillectomy      Around 1994  . Tubal ligation    .  Esophagogastroduodenoscopy N/A 09/16/2013    Procedure: ESOPHAGOGASTRODUODENOSCOPY (EGD);  Surgeon: Wonda Horner, MD;  Location: [facility];  Service: Endoscopy;  Laterality: N/A;  . Laparoscopy N/A 09/20/2013    Procedure: DIAGNOSTIC LAPAROSCOPY,DISTAL GASTRECTOMY AND FEEDING GASTROJEJUNOSTOMY;  Surgeon: [name], MD;  Location: [facility];  Service: General;  Laterality: N/A;  . Portacath placement Left 11/18/2013    Procedure: INSERTION PORT-A-CATH;  Surgeon: [name], MD;  Location: [facility];  Service: General;  Laterality: Left;   Family History  Problem Relation Age of Onset  . Hypertension Mother   . Diabetes Mellitus II Mother   . Hypertension Sister   . Hypertension Brother   . Heart failure Maternal Grandmother   . Hypertension Maternal Grandfather   . Prostate cancer Paternal Uncle     living at 11  . Gastric cancer Cousin 40    pat first cousin; daughter of pat uncle living with prostate ca   . Gastric cancer Cousin     mat first cousin; daughter of mat aunt who died in 77s due to heart issues  . Prostate cancer Paternal Uncle     deceased  . Prostate cancer Paternal Uncle     deceased  . Lung cancer Paternal Uncle     smoker; deceased   History  Substance Use Topics  . Smoking status: Former Research scientist (life sciences)  . Smokeless tobacco: Never Used     Comment: Stopped 2004  . Alcohol Use: No  OB History    No data available     Review of Systems  Constitutional: Positive for chills, diaphoresis and fatigue.  Cardiovascular: Positive for chest pain.  Gastrointestinal: Positive for nausea, vomiting and abdominal pain.  Musculoskeletal: Positive for back pain.  All other systems reviewed and are negative.     Allergies  Review of patient's allergies indicates no known allergies.  Home Medications   Prior to Admission medications   Medication Sig Start Date End Date Taking? Authorizing Provider  capecitabine (XELODA) 500 MG tablet Take 2 tablets (1,000 mg total) by  mouth 2 (two) times daily after a meal. Patient not taking: Reported on 07/18/2014 05/12/14   Curt Bears, MD  carvedilol (COREG) 6.25 MG tablet Take 6.25 mg by mouth 2 (two) times daily with a meal.    Historical Provider, MD  hyaluronate sodium (RADIAPLEXRX) GEL Apply 1 application topically 2 (two) times daily as needed (stomach).     Historical Provider, MD  lidocaine-prilocaine (EMLA) cream Apply 1 application topically as needed. Apply to port 1 hour before chemo 11/13/13   Curt Bears, MD  megestrol (MEGACE ES) 625 MG/5ML suspension Take 5 mLs (625 mg total) by mouth daily. Patient not taking: Reported on 07/18/2014 06/30/14   Curt Bears, MD  methylPREDNISolone (MEDROL DOSEPAK) 4 MG TBPK tablet Take per package instructions. To increase appetite. 07/10/14   Curt Bears, MD  metoCLOPramide (REGLAN) 10 MG tablet Take 1 tablet (10 mg total) by mouth 2 (two) times daily. Then, stop taking this medication. Patient not taking: Reported on 07/18/2014 06/16/14   Eppie Gibson, MD  Multiple Vitamins-Minerals (MULTIVITAMIN & MINERAL PO) Take 1 tablet by mouth daily.    Historical Provider, MD  ondansetron (ZOFRAN ODT) 8 MG disintegrating tablet 8mg  ODT q4 hours prn nausea Patient not taking: Reported on 07/18/2014 06/19/14   Veryl Speak, MD  ondansetron (ZOFRAN) 4 MG tablet Take 1 tablet (4 mg total) by mouth every 6 (six) hours. 07/29/14   Hanna Patel-Mills, PA-C  OVER THE COUNTER MEDICATION     Historical Provider, MD  oxyCODONE (OXY IR/ROXICODONE) 5 MG immediate release tablet Take 1 tablet (5 mg total) by mouth every 4 (four) hours as needed for severe pain. Patient not taking: Reported on 07/18/2014 02/17/14   Maryanna Shape, NP  oxyCODONE-acetaminophen (PERCOCET) 7.5-325 MG per tablet Take 1 tablet by mouth every 6 (six) hours as needed for severe pain. Patient not taking: Reported on 07/18/2014 07/08/14   Carlton Adam, PA-C  pantoprazole (PROTONIX) 40 MG tablet Take 1 tablet (40 mg  total) by mouth 2 (two) times daily before a meal. Patient not taking: Reported on 07/18/2014 01/10/14   Curt Bears, MD  potassium chloride (K-DUR) 10 MEQ tablet Take 1 tablet (10 mEq total) by mouth daily. 07/29/14   Hanna Patel-Mills, PA-C  promethazine (PHENERGAN) 25 MG tablet Take 1 tablet (25 mg total) by mouth every 6 (six) hours as needed for nausea or vomiting. 07/01/14   Dorie Rank, MD  temazepam (RESTORIL) 30 MG capsule Take 1 capsule (30 mg total) by mouth at bedtime as needed for sleep. Patient not taking: Reported on 07/18/2014 06/30/14   Curt Bears, MD   BP 133/98 mmHg  Pulse 91  Temp(Src) 98 F (36.7 C) (Oral)  Resp 18  Ht 5' (1.524 m)  Wt 115 lb (52.164 kg)  BMI 22.46 kg/m2  SpO2 100%  LMP 07/29/2014 Physical Exam  Constitutional: She is oriented to person, place, and time. She appears well-developed  and well-nourished. No distress.  Dry heaving.  HENT:  Head: Normocephalic and atraumatic.  Mouth/Throat: Oropharynx is clear and moist.  Eyes: Conjunctivae and EOM are normal. Pupils are equal, round, and reactive to light.  Neck: Normal range of motion. Neck supple. No JVD present.  Cardiovascular: Regular rhythm, normal heart sounds and intact distal pulses.  Tachycardia present.   No extremity edema.  Pulmonary/Chest: Effort normal and breath sounds normal. No respiratory distress.  Abdominal: Soft. Bowel sounds are normal. There is generalized tenderness. There is no rigidity, no rebound and no guarding.  No peritoneal signs.  Musculoskeletal: Normal range of motion. She exhibits no edema.  Neurological: She is alert and oriented to person, place, and time. She has normal strength. No sensory deficit.  Speech fluent, goal oriented. Moves limbs without ataxia. Equal grip strength bilateral.  Skin: Skin is warm and dry. She is not diaphoretic.  Psychiatric: She has a normal mood and affect. Her behavior is normal.  Nursing note and vitals reviewed.   ED Course   Procedures (including critical care time) Labs Review Labs Reviewed  CBC WITH DIFFERENTIAL/PLATELET - Abnormal; Notable for the following:    Neutrophils Relative % 82 (*)    Lymphocytes Relative 10 (*)    Lymphs Abs 0.5 (*)    All other components within normal limits  COMPREHENSIVE METABOLIC PANEL - Abnormal; Notable for the following:    Sodium 131 (*)    Chloride 94 (*)    Glucose, Bld 115 (*)    Total Protein 8.3 (*)    All other components within normal limits  TROPONIN I    Imaging Review Ct Angio Chest Pe W/cm &/or Wo Cm  07/31/2014   CLINICAL DATA:  Chest pain and tachycardia. History of gastric cancer.  EXAM: CT ANGIOGRAPHY CHEST WITH CONTRAST  TECHNIQUE: Multidetector CT imaging of the chest was performed using the standard protocol during bolus administration of intravenous contrast. Multiplanar CT image reconstructions and MIPs were obtained to evaluate the vascular anatomy.  CONTRAST:  148mL OMNIPAQUE IOHEXOL 350 MG/ML SOLN  COMPARISON:  09/13/2013  FINDINGS: THORACIC INLET/BODY WALL:  Left subclavian porta catheter in good position with tip at the upper SVC level. No adenopathy in the left supraclavicular fossa.  MEDIASTINUM:  Normal heart size and no pericardial effusion. No aortic aneurysm, dissection, or significant atherosclerosis. No lymphadenopathy.  No evidence of pulmonary embolism. Long-standing, potentially developmental diminutive right lower lobe pulmonary arteries and veins. Small volume right lower lobe with interstitial coarsening compatible with scarring, likely from chronic hypoxia. There is associated hypertrophy of intercostal, bronchial and phrenic arteries on the right.  LUNG WINDOWS:  Low volume right lower lobe with chronic reticular markings and cystic change. No pneumonia, edema, effusion, or pneumothorax. No suspicious pulmonary nodules to suggest pulmonary metastatic disease.  UPPER ABDOMEN:  No acute findings.  OSSEOUS:  No acute fracture.  No suspicious  lytic or blastic lesions.  Review of the MIP images confirms the above findings.  IMPRESSION: 1. No evidence of pulmonary embolism or other acute disease. 2. 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   Electronically Signed   By: Monte Fantasia M.D.   On: 07/31/2014 13:27     EKG Interpretation   Date/Time:  Thursday July 31 2014 11:13:23 EDT Ventricular Rate:  92 PR Interval:  98 QRS Duration: 78 QT Interval:  360 QTC Calculation: 445 R Axis:  72 Text Interpretation:  Sinus rhythm with sinus arrhythmia with short PR  with frequent Premature ventricular complexes Right atrial enlargement  Left ventricular hypertrophy Abnormal ECG Confirmed by ZACKOWSKI  MD,  SCOTT ([id number]) on 07/31/2014 12:07:50 PM      MDM   Final diagnoses:  Left sided chest pain  Non-intractable vomiting with nausea, vomiting of unspecified type   Nontoxic appearing, NAD. Afebrile. Tachycardic. Vitals otherwise stable. Labs obtained prior to my evaluation by triage, hyponatremia noted. Troponin within normal limits. Doubt cardiac, HEART score 3. Cannot PERC negative  Given tachycardia and history of cancer. Will obtain CT angio to rule out PE.  CT negative for PE. Pain controlled in ED after fluids and meds. States she is feeling much better. HR now normal. No further vomiting. Tolerates PO. Abdomen soft and non-tender. Stable for discharge. Advised follow-up with PCP and oncologist. Return precautions given. Patient states understanding of treatment care plan and is agreeable.  Carman Ching, PA-C 07/31/14 1346

## 2014-08-01 ENCOUNTER — Other Ambulatory Visit: Payer: Self-pay | Admitting: Internal Medicine

## 2014-08-01 ENCOUNTER — Emergency Department (HOSPITAL_COMMUNITY)
Admission: EM | Admit: 2014-08-01 | Discharge: 2014-08-01 | Disposition: A | Discharge status: Home / Self Care | Payer: Medicaid Other | Attending: Emergency Medicine | Admitting: Emergency Medicine

## 2014-08-01 ENCOUNTER — Encounter (HOSPITAL_COMMUNITY): Payer: Self-pay | Admitting: Emergency Medicine

## 2014-08-01 ENCOUNTER — Telehealth: Payer: Self-pay | Admitting: *Deleted

## 2014-08-01 DIAGNOSIS — Z8739 Personal history of other diseases of the musculoskeletal system and connective tissue: Secondary | ICD-10-CM | POA: Diagnosis not present

## 2014-08-01 DIAGNOSIS — R011 Cardiac murmur, unspecified: Secondary | ICD-10-CM | POA: Insufficient documentation

## 2014-08-01 DIAGNOSIS — R109 Unspecified abdominal pain: Secondary | ICD-10-CM

## 2014-08-01 DIAGNOSIS — Z8742 Personal history of other diseases of the female genital tract: Secondary | ICD-10-CM | POA: Diagnosis not present

## 2014-08-01 DIAGNOSIS — I10 Essential (primary) hypertension: Secondary | ICD-10-CM | POA: Diagnosis not present

## 2014-08-01 DIAGNOSIS — Z87891 Personal history of nicotine dependence: Secondary | ICD-10-CM | POA: Diagnosis not present

## 2014-08-01 DIAGNOSIS — K219 Gastro-esophageal reflux disease without esophagitis: Secondary | ICD-10-CM | POA: Insufficient documentation

## 2014-08-01 DIAGNOSIS — Z85028 Personal history of other malignant neoplasm of stomach: Secondary | ICD-10-CM | POA: Diagnosis not present

## 2014-08-01 DIAGNOSIS — R1084 Generalized abdominal pain: Secondary | ICD-10-CM | POA: Insufficient documentation

## 2014-08-01 DIAGNOSIS — Z79899 Other long term (current) drug therapy: Secondary | ICD-10-CM | POA: Insufficient documentation

## 2014-08-01 DIAGNOSIS — C169 Malignant neoplasm of stomach, unspecified: Secondary | ICD-10-CM

## 2014-08-01 MED ORDER — ONDANSETRON HCL 4 MG/2ML IJ SOLN
4.0000 mg | Freq: Once | INTRAMUSCULAR | Status: AC
  Administered 2014-08-01: 4 mg via INTRAVENOUS
  Filled 2014-08-01: qty 2

## 2014-08-01 MED ORDER — HYDROMORPHONE HCL 1 MG/ML IJ SOLN
1.0000 mg | Freq: Once | INTRAMUSCULAR | Status: AC
  Administered 2014-08-01: 1 mg via INTRAVENOUS
  Filled 2014-08-01: qty 1

## 2014-08-01 MED ORDER — SODIUM CHLORIDE 0.9 % IV BOLUS (SEPSIS)
1000.0000 mL | Freq: Once | INTRAVENOUS | Status: AC
  Administered 2014-08-01: 1000 mL via INTRAVENOUS

## 2014-08-01 NOTE — ED Notes (Signed)
Bed: [facility] Expected date:  Expected time:  Means of arrival:  Comments: 

## 2014-08-01 NOTE — Telephone Encounter (Signed)
VM message from pt. She has called Dr. Estell Harpin office (GI physician) and was told she needs referral for an appt there.

## 2014-08-01 NOTE — ED Notes (Signed)
Per pt, has been seen multiple times for same symptoms-states work-up negative-has not made appointment with GI MD

## 2014-08-01 NOTE — ED Notes (Signed)
Provided pt with ice water and saltine crackers for PO challenge per orders.

## 2014-08-01 NOTE — Telephone Encounter (Signed)
TC to patient this morning. She states she has been vomiting most of the night and is going to the ED as soon as someone comes to get her. Advised her also to contact her GI doctor. Paula [name] stated she could not remember who it was.  Reviewed pt's records and her GI doctor is Dr. Penelope Coop @ [phone]. Gave her the name and phone #. Asked to let this office know what transpires in ED today. She stated she would.

## 2014-08-01 NOTE — Telephone Encounter (Signed)
Referral for Dr. Penelope Coop has been done per Dr. [name]

## 2014-08-01 NOTE — Telephone Encounter (Signed)
Referral done

## 2014-08-01 NOTE — Discharge Instructions (Signed)
You were evaluated in the ED today for abdominal discomfort. This appears to be a chronic problem for you. It is important for you to follow-up with gastroenterology for further evaluation and management of your symptoms. There does not appear to be an emergent cause for your symptoms today. Please return to ED for new or worsening symptoms.  Abdominal Pain, Women Abdominal (stomach, pelvic, or belly) pain can be caused by many things. It is important to tell your doctor:  The location of the pain.  Does it come and go or is it present all the time?  Are there things that start the pain (eating certain foods, exercise)?  Are there other symptoms associated with the pain (fever, nausea, vomiting, diarrhea)? All of this is helpful to know when trying to find the cause of the pain. CAUSES   Stomach: virus or bacteria infection, or ulcer.  Intestine: appendicitis (inflamed appendix), regional ileitis (Crohn's disease), ulcerative colitis (inflamed colon), irritable bowel syndrome, diverticulitis (inflamed diverticulum of the colon), or cancer of the stomach or intestine.  Gallbladder disease or stones in the gallbladder.  Kidney disease, kidney stones, or infection.  Pancreas infection or cancer.  Fibromyalgia (pain disorder).  Diseases of the female organs:  Uterus: fibroid (non-cancerous) tumors or infection.  Fallopian tubes: infection or tubal pregnancy.  Ovary: cysts or tumors.  Pelvic adhesions (scar tissue).  Endometriosis (uterus lining tissue growing in the pelvis and on the pelvic organs).  Pelvic congestion syndrome (female organs filling up with blood just before the menstrual period).  Pain with the menstrual period.  Pain with ovulation (producing an egg).  Pain with an IUD (intrauterine device, birth control) in the uterus.  Cancer of the female organs.  Functional pain (pain not caused by a disease, may improve without treatment).  Psychological  pain.  Depression. DIAGNOSIS  Your doctor will decide the seriousness of your pain by doing an examination.  Blood tests.  X-rays.  Ultrasound.  CT scan (computed tomography, special type of X-ray).  MRI (magnetic resonance imaging).  Cultures, for infection.  Barium enema (dye inserted in the large intestine, to better view it with X-rays).  Colonoscopy (looking in intestine with a lighted tube).  Laparoscopy (minor surgery, looking in abdomen with a lighted tube).  Major abdominal exploratory surgery (looking in abdomen with a large incision). TREATMENT  The treatment will depend on the cause of the pain.   Many cases can be observed and treated at home.  Over-the-counter medicines recommended by your caregiver.  Prescription medicine.  Antibiotics, for infection.  Birth control pills, for painful periods or for ovulation pain.  Hormone treatment, for endometriosis.  Nerve blocking injections.  Physical therapy.  Antidepressants.  Counseling with a psychologist or psychiatrist.  Minor or major surgery. HOME CARE INSTRUCTIONS   Do not take laxatives, unless directed by your caregiver.  Take over-the-counter pain medicine only if ordered by your caregiver. Do not take aspirin because it can cause an upset stomach or bleeding.  Try a clear liquid diet (broth or water) as ordered by your caregiver. Slowly move to a bland diet, as tolerated, if the pain is related to the stomach or intestine.  Have a thermometer and take your temperature several times a day, and record it.  Bed rest and sleep, if it helps the pain.  Avoid sexual intercourse, if it causes pain.  Avoid stressful situations.  Keep your follow-up appointments and tests, as your caregiver orders.  If the pain does not go away  with medicine or surgery, you may try:  Acupuncture.  Relaxation exercises (yoga, meditation).  Group therapy.  Counseling. SEEK MEDICAL CARE IF:   You  notice certain foods cause stomach pain.  Your home care treatment is not helping your pain.  You need stronger pain medicine.  You want your IUD removed.  You feel faint or lightheaded.  You develop nausea and vomiting.  You develop a rash.  You are having side effects or an allergy to your medicine. SEEK IMMEDIATE MEDICAL CARE IF:   Your pain does not go away or gets worse.  You have a fever.  Your pain is felt only in portions of the abdomen. The right side could possibly be appendicitis. The left lower portion of the abdomen could be colitis or diverticulitis.  You are passing blood in your stools (bright red or black tarry stools, with or without vomiting).  You have blood in your urine.  You develop chills, with or without a fever.  You pass out. MAKE SURE YOU:   Understand these instructions.  Will watch your condition.  Will get help right away if you are not doing well or get worse. Document Released: 12/05/2006 Document Revised: 06/24/2013 Document Reviewed: 12/25/2008 [facility] Patient Information 2015 [place], [place]. This information is not intended to replace advice given to you by your health care provider. Make sure you discuss any questions you have with your health care provider.  Chronic Pain Chronic pain can be defined as pain that is off and on and lasts for 3-6 months or longer. Many things cause chronic pain, which can make it difficult to make a diagnosis. There are many treatment options available for chronic pain. However, finding a treatment that works well for you may require trying various approaches until the right one is found. Many people benefit from a combination of two or more types of treatment to control their pain. SYMPTOMS  Chronic pain can occur anywhere in the body and can range from mild to very severe. Some types of chronic pain include:  Headache.  Low back pain.  Cancer pain.  Arthritis pain.  Neurogenic pain. This is  pain resulting from damage to nerves. People with chronic pain may also have other symptoms such as:  Depression.  Anger.  Insomnia.  Anxiety. DIAGNOSIS  Your health care provider will help diagnose your condition over time. In many cases, the initial focus will be on excluding possible conditions that could be causing the pain. Depending on your symptoms, your health care provider may order tests to diagnose your condition. Some of these tests may include:   Blood tests.   CT scan.   MRI.   X-rays.   Ultrasounds.   Nerve conduction studies.  You may need to see a specialist.  TREATMENT  Finding treatment that works well may take time. You may be referred to a pain specialist. He or she may prescribe medicine or therapies, such as:   Mindful meditation or yoga.  Shots (injections) of numbing or pain-relieving medicines into the spine or area of pain.  Local electrical stimulation.  Acupuncture.   Massage therapy.   Aroma, color, light, or sound therapy.   Biofeedback.   Working with a physical therapist to keep from getting stiff.   Regular, gentle exercise.   Cognitive or behavioral therapy.   Group support.  Sometimes, surgery may be recommended.  HOME CARE INSTRUCTIONS   Take all medicines as directed by your health care provider.   Lessen stress  in your life by relaxing and doing things such as listening to calming music.   Exercise or be active as directed by your health care provider.   Eat a healthy diet and include things such as vegetables, fruits, fish, and lean meats in your diet.   Keep all follow-up appointments with your health care provider.   Attend a support group with others suffering from chronic pain. SEEK MEDICAL CARE IF:   Your pain gets worse.   You develop a new pain that was not there before.   You cannot tolerate medicines given to you by your health care provider.   You have new symptoms since  your last visit with your health care provider.  SEEK IMMEDIATE MEDICAL CARE IF:   You feel weak.   You have decreased sensation or numbness.   You lose control of bowel or bladder function.   Your pain suddenly gets much worse.   You develop shaking.  You develop chills.  You develop confusion.  You develop chest pain.  You develop shortness of breath.  MAKE SURE YOU:  Understand these instructions.  Will watch your condition.  Will get help right away if you are not doing well or get worse. Document Released: 10/30/2001 Document Revised: 10/10/2012 Document Reviewed: 08/03/2012 [facility] Patient Information 2015 [place], [place]. This information is not intended to replace advice given to you by your health care provider. Make sure you discuss any questions you have with your health care provider.

## 2014-08-01 NOTE — ED Provider Notes (Signed)
CSN: [id number]     Arrival date & time 08/01/14  76 History   First MD Initiated Contact with Patient 08/01/14 1342     Chief Complaint  Patient presents with  . Abdominal Pain     (Consider location/radiation/quality/duration/timing/severity/associated sxs/prior Treatment) HPI Paula [name] is a 49 y.o. female with history of stomach cancer, chronic abdominal pain, comes in for evaluation of acute abdominal pain. Patient states she was seen at [facility] last night for the same symptoms and experienced relief with Dilaudid and IV fluids. She reports feeling nauseous again today but denies any vomiting. She reports the same chronic abdominal pain that she attributes to the vomiting. She denies any focal abdominal tenderness. Denies fevers, chills, urinary symptoms, vaginal bleeding or discharge, pelvic pain. Rates her discomfort as a 10/10. States she is waiting on a referral from her PCP, Dr. Inda Merlin, so that she may go see her GI specialist. No other aggravating or modifying factors. States she only experiences chest discomfort and abdominal discomfort during her menstrual cycle. Patient is persistently asking for pain medicines throughout history of present illness.  Past Medical History  Diagnosis Date  . Headache(784.0) 2011    Migraines  . Esophageal reflux     On omeprazole  . Costochondritis   . Hypertension     started around 2011  . Ovarian cyst   . Stomach cancer   . Family history of cancer   . Heart murmur   . On antineoplastic chemotherapy     5-FU and leucovorin   Past Surgical History  Procedure Laterality Date  . Oophorectomy      Around 1985 left ovary removal  . Tonsillectomy      Around 1994  . Tubal ligation    . Esophagogastroduodenoscopy N/A 09/16/2013    Procedure: ESOPHAGOGASTRODUODENOSCOPY (EGD);  Surgeon: Wonda Horner, MD;  Location: [facility];  Service: Endoscopy;  Laterality: N/A;  . Laparoscopy N/A 09/20/2013    Procedure:  DIAGNOSTIC LAPAROSCOPY,DISTAL GASTRECTOMY AND FEEDING GASTROJEJUNOSTOMY;  Surgeon: [name], MD;  Location: [facility];  Service: General;  Laterality: N/A;  . Portacath placement Left 11/18/2013    Procedure: INSERTION PORT-A-CATH;  Surgeon: [name], MD;  Location: [facility];  Service: General;  Laterality: Left;   Family History  Problem Relation Age of Onset  . Hypertension Mother   . Diabetes Mellitus II Mother   . Hypertension Sister   . Hypertension Brother   . Heart failure Maternal Grandmother   . Hypertension Maternal Grandfather   . Prostate cancer Paternal Uncle     living at 51  . Gastric cancer Cousin 79    pat first cousin; daughter of pat uncle living with prostate ca   . Gastric cancer Cousin     mat first cousin; daughter of mat aunt who died in 20s due to heart issues  . Prostate cancer Paternal Uncle     deceased  . Prostate cancer Paternal Uncle     deceased  . Lung cancer Paternal Uncle     smoker; deceased   History  Substance Use Topics  . Smoking status: Former Research scientist (life sciences)  . Smokeless tobacco: Never Used     Comment: Stopped 2004  . Alcohol Use: No   OB History    No data available     Review of Systems A 10 point review of systems was completed and was negative except for pertinent positives and negatives as mentioned in the history of present illness  Allergies  Review of patient's allergies indicates no known allergies.  Home Medications   Prior to Admission medications   Medication Sig Start Date End Date Taking? Authorizing Provider  lidocaine-prilocaine (EMLA) cream Apply 1 application topically as needed. Apply to port 1 hour before chemo 11/13/13  Yes Curt Bears, MD  megestrol (MEGACE ES) 625 MG/5ML suspension Take 5 mLs (625 mg total) by mouth daily. 06/30/14  Yes Curt Bears, MD  Multiple Vitamins-Minerals (MULTIVITAMIN & MINERAL PO) Take 1 tablet by mouth daily.   Yes Historical Provider, MD  ondansetron (ZOFRAN) 4 MG tablet  Take 1 tablet (4 mg total) by mouth every 6 (six) hours. 07/29/14  Yes Hanna Patel-Mills, PA-C  oxyCODONE-acetaminophen (PERCOCET) 7.5-325 MG per tablet Take 1 tablet by mouth every 6 (six) hours as needed for severe pain. 07/08/14  Yes Adrena E Johnson, PA-C  potassium chloride (K-DUR) 10 MEQ tablet Take 1 tablet (10 mEq total) by mouth daily. 07/29/14  Yes Hanna Patel-Mills, PA-C  promethazine (PHENERGAN) 25 MG tablet Take 1 tablet (25 mg total) by mouth every 6 (six) hours as needed for nausea or vomiting. 07/01/14  Yes Dorie Rank, MD  capecitabine (XELODA) 500 MG tablet Take 2 tablets (1,000 mg total) by mouth 2 (two) times daily after a meal. Patient not taking: Reported on 07/18/2014 05/12/14   Curt Bears, MD  carvedilol (COREG) 6.25 MG tablet Take 6.25 mg by mouth 2 (two) times daily with a meal.    Historical Provider, MD  methylPREDNISolone (MEDROL DOSEPAK) 4 MG TBPK tablet Take per package instructions. To increase appetite. Patient not taking: Reported on 08/01/2014 07/10/14   Curt Bears, MD  metoCLOPramide (REGLAN) 10 MG tablet Take 1 tablet (10 mg total) by mouth 2 (two) times daily. Then, stop taking this medication. Patient not taking: Reported on 07/18/2014 06/16/14   Eppie Gibson, MD  ondansetron (ZOFRAN ODT) 8 MG disintegrating tablet 8mg  ODT q4 hours prn nausea Patient not taking: Reported on 07/18/2014 06/19/14   Veryl Speak, MD  pantoprazole (PROTONIX) 40 MG tablet Take 1 tablet (40 mg total) by mouth 2 (two) times daily before a meal. Patient not taking: Reported on 07/18/2014 01/10/14   Curt Bears, MD  temazepam (RESTORIL) 30 MG capsule Take 1 capsule (30 mg total) by mouth at bedtime as needed for sleep. Patient not taking: Reported on 07/18/2014 06/30/14   Curt Bears, MD   BP 125/78 mmHg  Pulse 94  Temp(Src) 98.3 F (36.8 C) (Oral)  Resp 16  SpO2 100%  LMP 07/29/2014 Physical Exam  Constitutional: She is oriented to person, place, and time. She appears  well-developed and well-nourished.  HENT:  Head: Normocephalic and atraumatic.  Mouth/Throat: Oropharynx is clear and moist.  Eyes: Conjunctivae are normal. Pupils are equal, round, and reactive to light. Right eye exhibits no discharge. Left eye exhibits no discharge. No scleral icterus.  Neck: Neck supple.  Cardiovascular: Normal rate, regular rhythm and normal heart sounds.   Pulmonary/Chest: Effort normal and breath sounds normal. No respiratory distress. She has no wheezes. She has no rales.  Abdominal: Soft. There is no tenderness.  Diffuse abdominal tenderness with no focal tenderness. Abdomen otherwise soft, nondistended without rebound or guarding.  Musculoskeletal: Normal range of motion. She exhibits no tenderness.  Neurological: She is alert and oriented to person, place, and time.  Cranial Nerves II-XII grossly intact  Skin: Skin is warm and dry. No rash noted.  Psychiatric: She has a normal mood and affect.  Nursing note and vitals  reviewed.   ED Course  Procedures (including critical care time) Labs Review Labs Reviewed  I-STAT CHEM 8, ED    Imaging Review Ct Angio Chest Pe W/cm &/or Wo Cm  07/31/2014   CLINICAL DATA:  Chest pain and tachycardia. History of gastric cancer.  EXAM: CT ANGIOGRAPHY CHEST WITH CONTRAST  TECHNIQUE: Multidetector CT imaging of the chest was performed using the standard protocol during bolus administration of intravenous contrast. Multiplanar CT image reconstructions and MIPs were obtained to evaluate the vascular anatomy.  CONTRAST:  143mL OMNIPAQUE IOHEXOL 350 MG/ML SOLN  COMPARISON:  09/13/2013  FINDINGS: THORACIC INLET/BODY WALL:  Left subclavian porta catheter in good position with tip at the upper SVC level. No adenopathy in the left supraclavicular fossa.  MEDIASTINUM:  Normal heart size and no pericardial effusion. No aortic aneurysm, dissection, or significant atherosclerosis. No lymphadenopathy.  No evidence of pulmonary embolism.  Long-standing, potentially developmental diminutive right lower lobe pulmonary arteries and veins. Small volume right lower lobe with interstitial coarsening compatible with scarring, likely from chronic hypoxia. There is associated hypertrophy of intercostal, bronchial and phrenic arteries on the right.  LUNG WINDOWS:  Low volume right lower lobe with chronic reticular markings and cystic change. No pneumonia, edema, effusion, or pneumothorax. No suspicious pulmonary nodules to suggest pulmonary metastatic disease.  UPPER ABDOMEN:  No acute findings.  OSSEOUS:  No acute fracture.  No suspicious lytic or blastic lesions.  Review of the MIP images confirms the above findings.  IMPRESSION: 1. No evidence of pulmonary embolism or other acute disease. 2. 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   Electronically Signed   By: Monte Fantasia M.D.   On: 07/31/2014 13:27     EKG Interpretation None     Meds given in ED:  Medications  HYDROmorphone (DILAUDID) injection 1 mg (1 mg Intravenous Given 08/01/14 1436)  sodium chloride 0.9 % bolus 1,000 mL (1,000 mLs Intravenous New Bag/Given 08/01/14 1440)  ondansetron (ZOFRAN) injection 4 mg (4 mg Intravenous Given 08/01/14 1436)  HYDROmorphone (DILAUDID) injection 1 mg (1 mg Intravenous Given 08/01/14 1550)  ondansetron (ZOFRAN) injection 4 mg (4 mg Intravenous Given 08/01/14 1550)    New Prescriptions   No medications on file   Filed Vitals:   08/01/14 1324 08/01/14 1556  BP: 183/84 125/78  Pulse: 99 94  Temp: 98.3 F (36.8 C)   TempSrc: Oral   Resp: 19 16  SpO2: 100% 100%    MDM  Vitals stable - WNL -afebrile Pt resting comfortably in ED.  Says she feels much better after administration of IV Dilaudid and IV fluids in ED. No vomiting in ED. Patient tolerating PO PE--diffuse abdominal tenderness. Exam unchanged  from prior exams. No peritoneal signs. No evidence of other acute or surgical abdomen. Labwork: Labs obtained yesterday are essentially unremarkable. Patient refuses lab draw today. Reports she is ready to go home. This appears to be a chronic problem with no acute findings. I encouraged continued use of her Zofran at home as well as encourage follow-up with her GI doctor. Upon review of the notes, her primary care, Dr. [name] has put in referral to GI. I discussed all relevant lab findings and imaging results with pt and they verbalized understanding. Discussed f/u with PCP within 48 hrs and return precautions, pt very amenable to plan.  Final diagnoses:  Abdominal discomfort       Comer Locket,  PA-C 08/01/14 1612  Ernestina Patches, MD 08/03/14 1024

## 2014-08-04 ENCOUNTER — Telehealth: Payer: Self-pay | Admitting: Family Medicine

## 2014-08-08 ENCOUNTER — Telehealth: Payer: Self-pay | Admitting: *Deleted

## 2014-08-08 ENCOUNTER — Telehealth: Payer: Self-pay | Admitting: Internal Medicine

## 2014-08-08 DIAGNOSIS — C169 Malignant neoplasm of stomach, unspecified: Secondary | ICD-10-CM

## 2014-08-08 MED ORDER — OXYCODONE-ACETAMINOPHEN 7.5-325 MG PO TABS: 1.0000 | ORAL_TABLET | Freq: Four times a day (QID) | ORAL | Status: DC | PRN

## 2014-08-08 NOTE — Telephone Encounter (Signed)
VM message received @ 9:20 am with pt requesting refill on her percocet. Last filled on 07/08/14.  Pt. Has 6 tablets left. She states she is taking 3 tablets/day at this time. She has enough for today and Saturday only. Please call patient when prescription is ready.

## 2014-08-08 NOTE — Telephone Encounter (Signed)
s.w. pt and and advised on 8.24 appt moved to 8.23 due to MD on pal.....pt ok and aware °

## 2014-08-08 NOTE — Telephone Encounter (Signed)
Notified pt Pain medication ready for pick up

## 2014-08-11 NOTE — Telephone Encounter (Signed)
I am not her primary care physician, but it is okay to make referral to gynecology

## 2014-08-11 NOTE — Telephone Encounter (Signed)
Pt called with request for referral to GYN as Dr [name] is on her medicaid card as PCP. Pt states , I need to see a GYN because every month at my time of the month i end up having to go to the ED. Pt concern forward to MD for review.

## 2014-08-12 NOTE — Telephone Encounter (Signed)
Referral completed. Pt notified.

## 2014-08-27 ENCOUNTER — Telehealth: Payer: Self-pay

## 2014-08-27 NOTE — Telephone Encounter (Signed)
Called pt back telling her a POF was sent for the scheduler to call the referral

## 2014-08-27 NOTE — Telephone Encounter (Signed)
Pt called saying she has not heard about her Gyn referral.

## 2014-09-03 ENCOUNTER — Other Ambulatory Visit: Payer: Self-pay | Admitting: Medical Oncology

## 2014-09-03 ENCOUNTER — Other Ambulatory Visit: Payer: Self-pay | Admitting: *Deleted

## 2014-09-03 ENCOUNTER — Telehealth: Payer: Self-pay | Admitting: *Deleted

## 2014-09-03 DIAGNOSIS — C169 Malignant neoplasm of stomach, unspecified: Secondary | ICD-10-CM

## 2014-09-03 DIAGNOSIS — R112 Nausea with vomiting, unspecified: Secondary | ICD-10-CM

## 2014-09-03 MED ORDER — PROMETHAZINE HCL 25 MG PO TABS: 25.0000 mg | ORAL_TABLET | Freq: Four times a day (QID) | ORAL | Status: DC | PRN

## 2014-09-03 MED ORDER — OXYCODONE-ACETAMINOPHEN 7.5-325 MG PO TABS: 1.0000 | ORAL_TABLET | Freq: Four times a day (QID) | ORAL | Status: DC | PRN

## 2014-09-03 NOTE — Telephone Encounter (Signed)
Left a message for pt to pick up rx before 415pm.

## 2014-09-03 NOTE — Progress Notes (Signed)
rx put in injection room.

## 2014-09-03 NOTE — Telephone Encounter (Signed)
Voicemail requesting "refill.  I need pain pills and nausea medicine.  Please call me at [phone] or at home."  Called and left voicemail requesting which nausea medicine is needed.  While awaiting return call from patient, called home number.  "I need the phenergan refill sent to [facility] in [place] at [address].  Call me to pick up the percocet script."  With this call patient reports referral for GYN was sent in the wrong que but [facility] told her to expect a call with in a week for an appointment.

## 2014-09-05 ENCOUNTER — Other Ambulatory Visit: Payer: Self-pay

## 2014-09-05 DIAGNOSIS — D259 Leiomyoma of uterus, unspecified: Secondary | ICD-10-CM

## 2014-09-05 NOTE — Progress Notes (Unsigned)
[place] scheduled per Dr. Harolyn Rutherford recommendations for fibroids and painful periods on July 25th @ 11am. Sent in basket message to call pt with [place] and GYN appt.

## 2014-09-08 ENCOUNTER — Encounter: Payer: Self-pay | Admitting: Obstetrics & Gynecology

## 2014-09-15 ENCOUNTER — Ambulatory Visit (HOSPITAL_COMMUNITY)
Admission: RE | Admit: 2014-09-15 | Discharge: 2014-09-15 | Disposition: A | Discharge status: Home / Self Care | Payer: Medicaid Other | Source: Ambulatory Visit | Attending: Obstetrics & Gynecology | Admitting: Obstetrics & Gynecology

## 2014-09-15 DIAGNOSIS — N854 Malposition of uterus: Secondary | ICD-10-CM | POA: Diagnosis not present

## 2014-09-15 DIAGNOSIS — D251 Intramural leiomyoma of uterus: Secondary | ICD-10-CM | POA: Diagnosis not present

## 2014-09-15 DIAGNOSIS — C169 Malignant neoplasm of stomach, unspecified: Secondary | ICD-10-CM | POA: Diagnosis not present

## 2014-09-15 DIAGNOSIS — R112 Nausea with vomiting, unspecified: Secondary | ICD-10-CM | POA: Insufficient documentation

## 2014-09-15 DIAGNOSIS — D259 Leiomyoma of uterus, unspecified: Secondary | ICD-10-CM

## 2014-09-16 ENCOUNTER — Encounter (HOSPITAL_BASED_OUTPATIENT_CLINIC_OR_DEPARTMENT_OTHER): Payer: Self-pay | Admitting: *Deleted

## 2014-09-16 ENCOUNTER — Encounter (HOSPITAL_BASED_OUTPATIENT_CLINIC_OR_DEPARTMENT_OTHER): Payer: Self-pay

## 2014-09-16 ENCOUNTER — Emergency Department (HOSPITAL_BASED_OUTPATIENT_CLINIC_OR_DEPARTMENT_OTHER)
Admission: EM | Admit: 2014-09-16 | Discharge: 2014-09-17 | Disposition: A | Discharge status: Home / Self Care | Payer: Medicaid Other | Source: Home / Self Care | Attending: Emergency Medicine | Admitting: Emergency Medicine

## 2014-09-16 ENCOUNTER — Emergency Department (HOSPITAL_BASED_OUTPATIENT_CLINIC_OR_DEPARTMENT_OTHER)
Admission: EM | Admit: 2014-09-16 | Discharge: 2014-09-16 | Disposition: A | Discharge status: Home / Self Care | Payer: Medicaid Other | Attending: Emergency Medicine | Admitting: Emergency Medicine

## 2014-09-16 DIAGNOSIS — Z85028 Personal history of other malignant neoplasm of stomach: Secondary | ICD-10-CM | POA: Diagnosis not present

## 2014-09-16 DIAGNOSIS — Z79899 Other long term (current) drug therapy: Secondary | ICD-10-CM | POA: Insufficient documentation

## 2014-09-16 DIAGNOSIS — Z87891 Personal history of nicotine dependence: Secondary | ICD-10-CM

## 2014-09-16 DIAGNOSIS — R011 Cardiac murmur, unspecified: Secondary | ICD-10-CM | POA: Insufficient documentation

## 2014-09-16 DIAGNOSIS — I10 Essential (primary) hypertension: Secondary | ICD-10-CM | POA: Insufficient documentation

## 2014-09-16 DIAGNOSIS — G43A0 Cyclical vomiting, not intractable: Secondary | ICD-10-CM

## 2014-09-16 DIAGNOSIS — R112 Nausea with vomiting, unspecified: Secondary | ICD-10-CM | POA: Diagnosis present

## 2014-09-16 DIAGNOSIS — Z8739 Personal history of other diseases of the musculoskeletal system and connective tissue: Secondary | ICD-10-CM | POA: Insufficient documentation

## 2014-09-16 DIAGNOSIS — Z793 Long term (current) use of hormonal contraceptives: Secondary | ICD-10-CM | POA: Insufficient documentation

## 2014-09-16 DIAGNOSIS — K219 Gastro-esophageal reflux disease without esophagitis: Secondary | ICD-10-CM | POA: Diagnosis not present

## 2014-09-16 DIAGNOSIS — Z9221 Personal history of antineoplastic chemotherapy: Secondary | ICD-10-CM | POA: Insufficient documentation

## 2014-09-16 DIAGNOSIS — Z8742 Personal history of other diseases of the female genital tract: Secondary | ICD-10-CM | POA: Insufficient documentation

## 2014-09-16 DIAGNOSIS — Z5111 Encounter for antineoplastic chemotherapy: Secondary | ICD-10-CM | POA: Insufficient documentation

## 2014-09-16 DIAGNOSIS — R1115 Cyclical vomiting syndrome unrelated to migraine: Secondary | ICD-10-CM

## 2014-09-16 LAB — COMPREHENSIVE METABOLIC PANEL
ALBUMIN: 4 g/dL (ref 3.5–5.0)
ALK PHOS: 96 U/L (ref 38–126)
ALT: 17 U/L (ref 14–54)
AST: 27 U/L (ref 15–41)
Anion gap: 8 (ref 5–15)
BUN: 7 mg/dL (ref 6–20)
CHLORIDE: 101 mmol/L (ref 101–111)
CO2: 26 mmol/L (ref 22–32)
CREATININE: 0.76 mg/dL (ref 0.44–1.00)
Calcium: 8.9 mg/dL (ref 8.9–10.3)
GFR calc Af Amer: >60 mL/min (ref >60)
GFR calc non Af Amer: >60 mL/min (ref >60)
Glucose, Bld: 189 mg/dL — ABNORMAL HIGH (ref 65–99)
POTASSIUM: 3.9 mmol/L (ref 3.5–5.1)
SODIUM: 135 mmol/L (ref 135–145)
Total Bilirubin: 0.7 mg/dL (ref 0.3–1.2)
Total Protein: 7.3 g/dL (ref 6.5–8.1)

## 2014-09-16 LAB — CBC WITH DIFFERENTIAL/PLATELET
BASOS ABS: 0 10*3/uL (ref 0.0–0.1)
Basophils Relative: 0 % (ref 0–1)
EOS PCT: 0 % (ref 0–5)
Eosinophils Absolute: 0 10*3/uL (ref 0.0–0.7)
HCT: 34.7 % — ABNORMAL LOW (ref 36.0–46.0)
Hemoglobin: 11.3 g/dL — ABNORMAL LOW (ref 12.0–15.0)
Lymphocytes Relative: 6 % — ABNORMAL LOW (ref 12–46)
Lymphs Abs: 0.3 10*3/uL — ABNORMAL LOW (ref 0.7–4.0)
MCH: 28.8 pg (ref 26.0–34.0)
MCHC: 32.6 g/dL (ref 30.0–36.0)
MCV: 88.3 fL (ref 78.0–100.0)
MONO ABS: 0.2 10*3/uL (ref 0.1–1.0)
MONOS PCT: 3 % (ref 3–12)
Neutro Abs: 5 10*3/uL (ref 1.7–7.7)
Neutrophils Relative %: 91 % — ABNORMAL HIGH (ref 43–77)
PLATELETS: 254 10*3/uL (ref 150–400)
RBC: 3.93 MIL/uL (ref 3.87–5.11)
RDW: 13.4 % (ref 11.5–15.5)
WBC: 5.5 10*3/uL (ref 4.0–10.5)

## 2014-09-16 MED ORDER — PROMETHAZINE HCL 25 MG/ML IJ SOLN
25.0000 mg | Freq: Once | INTRAMUSCULAR | Status: AC
  Administered 2014-09-16: 25 mg via INTRAVENOUS
  Filled 2014-09-16: qty 1

## 2014-09-16 MED ORDER — PROMETHAZINE HCL 25 MG/ML IJ SOLN
12.5000 mg | Freq: Once | INTRAMUSCULAR | Status: AC
  Administered 2014-09-16: 12.5 mg via INTRAVENOUS
  Filled 2014-09-16: qty 1

## 2014-09-16 MED ORDER — SODIUM CHLORIDE 0.9 % IV BOLUS (SEPSIS)
1000.0000 mL | Freq: Once | INTRAVENOUS | Status: AC
  Administered 2014-09-17: 1000 mL via INTRAVENOUS

## 2014-09-16 MED ORDER — SODIUM CHLORIDE 0.9 % IV BOLUS (SEPSIS)
1000.0000 mL | Freq: Once | INTRAVENOUS | Status: AC
  Administered 2014-09-16: 1000 mL via INTRAVENOUS

## 2014-09-16 MED ORDER — PROMETHAZINE HCL 25 MG/ML IJ SOLN
25.0000 mg | Freq: Once | INTRAMUSCULAR | Status: AC
  Administered 2014-09-17: 25 mg via INTRAVENOUS
  Filled 2014-09-16: qty 1

## 2014-09-16 MED ORDER — HYDROMORPHONE HCL 1 MG/ML IJ SOLN
1.0000 mg | Freq: Once | INTRAMUSCULAR | Status: AC
  Administered 2014-09-16: 1 mg via INTRAVENOUS
  Filled 2014-09-16: qty 1

## 2014-09-16 MED ORDER — OXYCODONE-ACETAMINOPHEN 7.5-325 MG PO TABS: 1.0000 | ORAL_TABLET | ORAL | Status: DC | PRN

## 2014-09-16 MED ORDER — PROMETHAZINE HCL 25 MG PO TABS: 25.0000 mg | ORAL_TABLET | Freq: Four times a day (QID) | ORAL | Status: DC | PRN

## 2014-09-16 MED ORDER — HYDROMORPHONE HCL 1 MG/ML IJ SOLN
1.0000 mg | Freq: Once | INTRAMUSCULAR | Status: AC
Start: 2014-09-16 — End: 2014-09-16
  Administered 2014-09-16: 1 mg via INTRAVENOUS
  Filled 2014-09-16: qty 1

## 2014-09-16 MED ORDER — ONDANSETRON HCL 4 MG/2ML IJ SOLN
4.0000 mg | Freq: Once | INTRAMUSCULAR | Status: AC
  Administered 2014-09-17: 4 mg via INTRAVENOUS
  Filled 2014-09-16: qty 2

## 2014-09-16 NOTE — ED Notes (Signed)
Pt c/o vomiting with lower back and center chest pain; states hx of same once a month since she had stomach cancer last year

## 2014-09-16 NOTE — ED Provider Notes (Signed)
CSN: [id number]     Arrival date & time 09/16/14  0212 History   First MD Initiated Contact with Patient 09/16/14 0422     Chief Complaint  Patient presents with  . Vomiting      (Consider location/radiation/quality/duration/timing/severity/associated sxs/prior Treatment) HPI  This is a 49 year old female with a history of stomach cancer status post distal gastrectomy, chemotherapy and radiation therapy. She states that every month, about the time of her menstrual cycle, she develops nausea, vomiting and severe epigastric pain. Her current episode began yesterday about 3 PM. She denies diarrhea. Her menses recently ended. She had a pelvic ultrasound yesterday to evaluate for ovarian cyst as possible etiology for her symptoms.  Nursing staff reports that she was demanding Dilaudid and Phenergan on arrival stating that the only combination of medications at work. It is noted that she is on oxycodone 7.5 milligrams/325 milligram, receiving 40 tablets every month for the past 3 months.  Past Medical History  Diagnosis Date  . Headache(784.0) 2011    Migraines  . Esophageal reflux     On omeprazole  . Costochondritis   . Hypertension     started around 2011  . Ovarian cyst   . Stomach cancer   . Family history of cancer   . Heart murmur   . On antineoplastic chemotherapy     5-FU and leucovorin   Past Surgical History  Procedure Laterality Date  . Oophorectomy      Around 1985 left ovary removal  . Tonsillectomy      Around 1994  . Tubal ligation    . Esophagogastroduodenoscopy N/A 09/16/2013    Procedure: ESOPHAGOGASTRODUODENOSCOPY (EGD);  Surgeon: Wonda Horner, MD;  Location: [facility];  Service: Endoscopy;  Laterality: N/A;  . Laparoscopy N/A 09/20/2013    Procedure: DIAGNOSTIC LAPAROSCOPY,DISTAL GASTRECTOMY AND FEEDING GASTROJEJUNOSTOMY;  Surgeon: [name], MD;  Location: [facility];  Service: General;  Laterality: N/A;  . Portacath placement Left 11/18/2013    Procedure:  INSERTION PORT-A-CATH;  Surgeon: [name], MD;  Location: [facility];  Service: General;  Laterality: Left;   Family History  Problem Relation Age of Onset  . Hypertension Mother   . Diabetes Mellitus II Mother   . Hypertension Sister   . Hypertension Brother   . Heart failure Maternal Grandmother   . Hypertension Maternal Grandfather   . Prostate cancer Paternal Uncle     living at 41  . Gastric cancer Cousin 10    pat first cousin; daughter of pat uncle living with prostate ca   . Gastric cancer Cousin     mat first cousin; daughter of mat aunt who died in 3s due to heart issues  . Prostate cancer Paternal Uncle     deceased  . Prostate cancer Paternal Uncle     deceased  . Lung cancer Paternal Uncle     smoker; deceased   History  Substance Use Topics  . Smoking status: Former Research scientist (life sciences)  . Smokeless tobacco: Never Used     Comment: Stopped 2004  . Alcohol Use: No   OB History    No data available     Review of Systems  All other systems reviewed and are negative.   Allergies  Review of patient's allergies indicates no known allergies.  Home Medications   Prior to Admission medications   Medication Sig Start Date End Date Taking? Authorizing Provider  capecitabine (XELODA) 500 MG tablet Take 2 tablets (1,000 mg total) by mouth 2 (two) times  daily after a meal. Patient not taking: Reported on 07/18/2014 05/12/14   Curt Bears, MD  carvedilol (COREG) 6.25 MG tablet Take 6.25 mg by mouth 2 (two) times daily with a meal.    Historical Provider, MD  lidocaine-prilocaine (EMLA) cream Apply 1 application topically as needed. Apply to port 1 hour before chemo 11/13/13   Curt Bears, MD  megestrol (MEGACE ES) 625 MG/5ML suspension Take 5 mLs (625 mg total) by mouth daily. 06/30/14   Curt Bears, MD  methylPREDNISolone (MEDROL DOSEPAK) 4 MG TBPK tablet Take per package instructions. To increase appetite. Patient not taking: Reported on 08/01/2014 07/10/14   Curt Bears, MD  metoCLOPramide (REGLAN) 10 MG tablet Take 1 tablet (10 mg total) by mouth 2 (two) times daily. Then, stop taking this medication. Patient not taking: Reported on 07/18/2014 06/16/14   Eppie Gibson, MD  Multiple Vitamins-Minerals (MULTIVITAMIN & MINERAL PO) Take 1 tablet by mouth daily.    Historical Provider, MD  ondansetron (ZOFRAN ODT) 8 MG disintegrating tablet 8mg  ODT q4 hours prn nausea Patient not taking: Reported on 07/18/2014 06/19/14   Veryl Speak, MD  ondansetron (ZOFRAN) 4 MG tablet Take 1 tablet (4 mg total) by mouth every 6 (six) hours. 07/29/14   Hanna Patel-Mills, PA-C  oxyCODONE-acetaminophen (PERCOCET) 7.5-325 MG per tablet Take 1 tablet by mouth every 6 (six) hours as needed for severe pain. 09/03/14   Curt Bears, MD  pantoprazole (PROTONIX) 40 MG tablet Take 1 tablet (40 mg total) by mouth 2 (two) times daily before a meal. Patient not taking: Reported on 07/18/2014 01/10/14   Curt Bears, MD  potassium chloride (K-DUR) 10 MEQ tablet Take 1 tablet (10 mEq total) by mouth daily. 07/29/14   Hanna Patel-Mills, PA-C  promethazine (PHENERGAN) 25 MG tablet Take 1 tablet (25 mg total) by mouth every 6 (six) hours as needed for nausea or vomiting. 09/03/14   Curt Bears, MD  temazepam (RESTORIL) 30 MG capsule Take 1 capsule (30 mg total) by mouth at bedtime as needed for sleep. Patient not taking: Reported on 07/18/2014 06/30/14   Curt Bears, MD   BP 142/84 mmHg  Pulse 90  Temp(Src) 98.4 F (36.9 C) (Oral)  Resp 20  Ht 5' (1.524 m)  Wt 114 lb (51.71 kg)  BMI 22.26 kg/m2  SpO2 100%  LMP 09/09/2014   Physical Exam  General: Well-developed, cachectic female in no acute distress; appearance consistent with age of record HENT: normocephalic; atraumatic Eyes: pupils equal, round and reactive to light; extraocular muscles intact Neck: supple Heart: regular rate and rhythm Lungs: clear to auscultation bilaterally Abdomen: soft; nondistended; epigastric  tenderness; no masses or hepatosplenomegaly; bowel sounds present Extremities: No deformity; full range of motion; pulses normal Neurologic: Awake, alert and oriented; motor function intact in all extremities and symmetric; no facial droop Skin: Warm and dry Psychiatric: Depressed affect; whimpering voice    ED Course  Procedures (including critical care time)   MDM   Nursing notes and vitals signs, including pulse oximetry, reviewed.  Summary of this visit's results, reviewed by myself:  Labs:  Results for orders placed or performed during the hospital encounter of 09/16/14 (from the past 24 hour(s))  CBC with Differential/Platelet     Status: Abnormal   Collection Time: 09/16/14  3:51 AM  Result Value Ref Range   WBC 5.5 4.0 - 10.5 K/uL   RBC 3.93 3.87 - 5.11 MIL/uL   Hemoglobin 11.3 (L) 12.0 - 15.0 g/dL   HCT 34.7 (L) 36.0 -  46.0 %   MCV 88.3 78.0 - 100.0 fL   MCH 28.8 26.0 - 34.0 pg   MCHC 32.6 30.0 - 36.0 g/dL   RDW 13.4 11.5 - 15.5 %   Platelets 254 150 - 400 K/uL   Neutrophils Relative % 91 (H) 43 - 77 %   Neutro Abs 5.0 1.7 - 7.7 K/uL   Lymphocytes Relative 6 (L) 12 - 46 %   Lymphs Abs 0.3 (L) 0.7 - 4.0 K/uL   Monocytes Relative 3 3 - 12 %   Monocytes Absolute 0.2 0.1 - 1.0 K/uL   Eosinophils Relative 0 0 - 5 %   Eosinophils Absolute 0.0 0.0 - 0.7 K/uL   Basophils Relative 0 0 - 1 %   Basophils Absolute 0.0 0.0 - 0.1 K/uL  Comprehensive metabolic panel     Status: Abnormal   Collection Time: 09/16/14  3:51 AM  Result Value Ref Range   Sodium 135 135 - 145 mmol/L   Potassium 3.9 3.5 - 5.1 mmol/L   Chloride 101 101 - 111 mmol/L   CO2 26 22 - 32 mmol/L   Glucose, Bld 189 (H) 65 - 99 mg/dL   BUN 7 6 - 20 mg/dL   Creatinine, Ser 0.76 0.44 - 1.00 mg/dL   Calcium 8.9 8.9 - 10.3 mg/dL   Total Protein 7.3 6.5 - 8.1 g/dL   Albumin 4.0 3.5 - 5.0 g/dL   AST 27 15 - 41 U/L   ALT 17 14 - 54 U/L   Alkaline Phosphatase 96 38 - 126 U/L   Total Bilirubin 0.7 0.3 - 1.2  mg/dL   GFR calc non Af Amer >60 >60 mL/min   GFR calc Af Amer >60 >60 mL/min   Anion gap 8 5 - 15    Imaging Studies: US Transvaginal Non-ob  09-16-2014   CLINICAL DATA:  Uterine fibroids. Nausea and vomiting. Gastric adenocarcinoma.  EXAM: TRANSABDOMINAL AND TRANSVAGINAL ULTRASOUND OF PELVIS  TECHNIQUE: Both transabdominal and transvaginal ultrasound examinations of the pelvis were performed. Transabdominal technique was performed for global imaging of the pelvis including uterus, ovaries, adnexal regions, and pelvic cul-de-sac. It was necessary to proceed with endovaginal exam following the transabdominal exam to visualize the endometrial stripe and ovaries.  COMPARISON:  06/05/2012  FINDINGS: Uterus  Measurements: 9.3 x 5.4 x 6.2 cm. Retroverted. An intramural fibroid is seen in the left posterior fundus measuring 2.6 x 2.5 x 2.6 cm. This has a submucosal component indenting the endometrium. This is mildly increased in size from 2.1 cm on previous study in 2014.  Endometrium  Thickness: 9 mm.  No focal abnormality visualized.  Right ovary  Measurements: 1.6 x 1.1 x 1.8 cm. Normal appearance/no adnexal mass.  Left ovary  Measurements: 3.6 x 2.4 x 3.2 cm. Normal appearance/no adnexal mass.  Other findings  No free fluid.  IMPRESSION: 2.6 cm left posterior fundal intramural fibroid with submucosal component, mildly increased in size compared to prior exam.  Normal appearance of both ovaries.  No adnexal mass identified.   Electronically Signed   By: Earle Gell M.D.   On: 09/16/14 11:44   US Pelvis Complete  09-16-14   CLINICAL DATA:  Uterine fibroids. Nausea and vomiting. Gastric adenocarcinoma.  EXAM: TRANSABDOMINAL AND TRANSVAGINAL ULTRASOUND OF PELVIS  TECHNIQUE: Both transabdominal and transvaginal ultrasound examinations of the pelvis were performed. Transabdominal technique was performed for global imaging of the pelvis including uterus, ovaries, adnexal regions, and pelvic cul-de-sac. It was  necessary to proceed with  endovaginal exam following the transabdominal exam to visualize the endometrial stripe and ovaries.  COMPARISON:  06/05/2012  FINDINGS: Uterus  Measurements: 9.3 x 5.4 x 6.2 cm. Retroverted. An intramural fibroid is seen in the left posterior fundus measuring 2.6 x 2.5 x 2.6 cm. This has a submucosal component indenting the endometrium. This is mildly increased in size from 2.1 cm on previous study in 2014.  Endometrium  Thickness: 9 mm.  No focal abnormality visualized.  Right ovary  Measurements: 1.6 x 1.1 x 1.8 cm. Normal appearance/no adnexal mass.  Left ovary  Measurements: 3.6 x 2.4 x 3.2 cm. Normal appearance/no adnexal mass.  Other findings  No free fluid.  IMPRESSION: 2.6 cm left posterior fundal intramural fibroid with submucosal component, mildly increased in size compared to prior exam.  Normal appearance of both ovaries.  No adnexal mass identified.   Electronically Signed   By: Earle Gell M.D.   On: 09/15/2014 11:44   5:40 AM Pain and vomiting controlled with IV medications. Patient was advised that based on new guidelines regarding patients on chronic narcotic therapy we are only permitted to write for medication to treat her until the next business day. She was advised she'll need to contact Dr. Worthy Flank office for further pain management.     Shanon Rosser, MD 09/16/14 [phone]

## 2014-09-16 NOTE — ED Notes (Signed)
Pt requesting to get some dilaudid and phenergan, states the only thing that helps

## 2014-09-16 NOTE — ED Notes (Signed)
Pt reports she was seen at this facility this morning for same - pt began experiencing n/v, chest pain and abd pain approx 18:00 this evening.

## 2014-09-16 NOTE — ED Provider Notes (Signed)
CSN: [id number]     Arrival date & time 09/16/14  2222 History   First MD Initiated Contact with Patient 09/16/14 2339     Chief Complaint  Patient presents with  . Vomiting      (Consider location/radiation/quality/duration/timing/severity/associated sxs/prior Treatment) HPI  This is a 49 year old female with a history of stomach cancer status post distal gastrectomy, chemotherapy and radiation therapy. She states that every month, about the time of her menstrual cycle, she develops nausea, vomiting and severe epigastric pain. Her current episode began yesterday about 3 PM. She denies diarrhea. Her menses recently ended. She had a pelvic ultrasound yesterday to evaluate for ovarian cyst as possible etiology for her symptoms; the ultrasound showed a single 2.6 centimeter fibroid.  She was seen by myself this morning and treated with IV Phenergan and allotted. She was given a one-day prescription for oxycodone, 6 tablets, after explaining that the ED is not the appropriate venue for the treatment of chronic pain. She has been receiving 40, 7.5mg  milligram oxycodone tablets monthly for the last 3 months. She states her nausea and abdominal pain returned about 10:30 this morning and she was not able to keep her oxycodone tablets down. She again complains of severe epigastric pain as well as chest wall pain which she has been told in the past was costochondritis.  The patient is repeatedly requesting Dilaudid and specifically Dilaudid for her symptoms.  Past Medical History  Diagnosis Date  . Headache(784.0) 2011    Migraines  . Esophageal reflux     On omeprazole  . Costochondritis   . Hypertension     started around 2011  . Ovarian cyst   . Stomach cancer   . Family history of cancer   . Heart murmur   . On antineoplastic chemotherapy     5-FU and leucovorin   Past Surgical History  Procedure Laterality Date  . Oophorectomy      Around 1985 left ovary removal  . Tonsillectomy       Around 1994  . Tubal ligation    . Esophagogastroduodenoscopy N/A 09/16/2013    Procedure: ESOPHAGOGASTRODUODENOSCOPY (EGD);  Surgeon: Wonda Horner, MD;  Location: [facility];  Service: Endoscopy;  Laterality: N/A;  . Laparoscopy N/A 09/20/2013    Procedure: DIAGNOSTIC LAPAROSCOPY,DISTAL GASTRECTOMY AND FEEDING GASTROJEJUNOSTOMY;  Surgeon: [name], MD;  Location: [facility];  Service: General;  Laterality: N/A;  . Portacath placement Left 11/18/2013    Procedure: INSERTION PORT-A-CATH;  Surgeon: [name], MD;  Location: [facility];  Service: General;  Laterality: Left;   Family History  Problem Relation Age of Onset  . Hypertension Mother   . Diabetes Mellitus II Mother   . Hypertension Sister   . Hypertension Brother   . Heart failure Maternal Grandmother   . Hypertension Maternal Grandfather   . Prostate cancer Paternal Uncle     living at 38  . Gastric cancer Cousin 33    pat first cousin; daughter of pat uncle living with prostate ca   . Gastric cancer Cousin     mat first cousin; daughter of mat aunt who died in 64s due to heart issues  . Prostate cancer Paternal Uncle     deceased  . Prostate cancer Paternal Uncle     deceased  . Lung cancer Paternal Uncle     smoker; deceased   History  Substance Use Topics  . Smoking status: Former Research scientist (life sciences)  . Smokeless tobacco: Never Used     Comment: Stopped  2004  . Alcohol Use: No   OB History    No data available     Review of Systems  All other systems reviewed and are negative.   Allergies  Review of patient's allergies indicates no known allergies.  Home Medications   Prior to Admission medications   Medication Sig Start Date End Date Taking? Authorizing Provider  carvedilol (COREG) 6.25 MG tablet Take 6.25 mg by mouth 2 (two) times daily with a meal.    Historical Provider, MD  lidocaine-prilocaine (EMLA) cream Apply 1 application topically as needed. Apply to port 1 hour before chemo 11/13/13   Curt Bears, MD   megestrol (MEGACE ES) 625 MG/5ML suspension Take 5 mLs (625 mg total) by mouth daily. 06/30/14   Curt Bears, MD  Multiple Vitamins-Minerals (MULTIVITAMIN & MINERAL PO) Take 1 tablet by mouth daily.    Historical Provider, MD  ondansetron (ZOFRAN) 4 MG tablet Take 1 tablet (4 mg total) by mouth every 6 (six) hours. 07/29/14   Hanna Patel-Mills, PA-C  oxyCODONE-acetaminophen (PERCOCET) 7.5-325 MG per tablet Take 1 tablet by mouth every 4 (four) hours as needed for severe pain (for pain). 09/16/14   Sashay Felling, MD  potassium chloride (K-DUR) 10 MEQ tablet Take 1 tablet (10 mEq total) by mouth daily. 07/29/14   Hanna Patel-Mills, PA-C  promethazine (PHENERGAN) 25 MG tablet Take 1 tablet (25 mg total) by mouth every 6 (six) hours as needed for nausea or vomiting. 09/16/14   Shanon Rosser, MD   LMP 09/09/2014  Physical Exam  General: Well-developed, cachectic female in no acute distress; appearance consistent with age of record HENT: normocephalic; atraumatic Eyes: pupils equal, round and reactive to light; extraocular muscles intact Neck: supple Heart: regular rate and rhythm Lungs: clear to auscultation bilaterally Chest: Bilateral rib tenderness Abdomen: soft; nondistended; epigastric tenderness; no masses or hepatosplenomegaly; bowel sounds present Extremities: No deformity; full range of motion; pulses normal Neurologic: Awake, alert and oriented; motor function intact in all extremities and symmetric; no facial droop Skin: Warm and dry Psychiatric: Depressed affect; whimpering voice    ED Course  Procedures (including critical care time)   EKG Interpretation   Date/Time:  Tuesday September 16 2014 22:33:15 EDT Ventricular Rate:  99 PR Interval:  106 QRS Duration: 70 QT Interval:  326 QTC Calculation: 418 R Axis:   84 Text Interpretation:  Sinus rhythm with short PR Biatrial enlargement Left  ventricular hypertrophy with repolarization abnormality Abnormal ECG Rate  is faster  Confirmed by Taro Hidrogo  MD, Jenny Reichmann ([id number]) on 09/16/2014 10:45:26 PM      MDM   Nursing notes and vitals signs, including pulse oximetry, reviewed.  Summary of this visit's results, reviewed by myself:  Labs:  Results for orders placed or performed during the hospital encounter of 09/16/14 (from the past 24 hour(s))  CBC with Differential/Platelet     Status: Abnormal   Collection Time: 09/16/14  3:51 AM  Result Value Ref Range   WBC 5.5 4.0 - 10.5 K/uL   RBC 3.93 3.87 - 5.11 MIL/uL   Hemoglobin 11.3 (L) 12.0 - 15.0 g/dL   HCT 34.7 (L) 36.0 - 46.0 %   MCV 88.3 78.0 - 100.0 fL   MCH 28.8 26.0 - 34.0 pg   MCHC 32.6 30.0 - 36.0 g/dL   RDW 13.4 11.5 - 15.5 %   Platelets 254 150 - 400 K/uL   Neutrophils Relative % 91 (H) 43 - 77 %   Neutro Abs 5.0 1.7 - 7.7  K/uL   Lymphocytes Relative 6 (L) 12 - 46 %   Lymphs Abs 0.3 (L) 0.7 - 4.0 K/uL   Monocytes Relative 3 3 - 12 %   Monocytes Absolute 0.2 0.1 - 1.0 K/uL   Eosinophils Relative 0 0 - 5 %   Eosinophils Absolute 0.0 0.0 - 0.7 K/uL   Basophils Relative 0 0 - 1 %   Basophils Absolute 0.0 0.0 - 0.1 K/uL  Comprehensive metabolic panel     Status: Abnormal   Collection Time: 09/16/14  3:51 AM  Result Value Ref Range   Sodium 135 135 - 145 mmol/L   Potassium 3.9 3.5 - 5.1 mmol/L   Chloride 101 101 - 111 mmol/L   CO2 26 22 - 32 mmol/L   Glucose, Bld 189 (H) 65 - 99 mg/dL   BUN 7 6 - 20 mg/dL   Creatinine, Ser 0.76 0.44 - 1.00 mg/dL   Calcium 8.9 8.9 - 10.3 mg/dL   Total Protein 7.3 6.5 - 8.1 g/dL   Albumin 4.0 3.5 - 5.0 g/dL   AST 27 15 - 41 U/L   ALT 17 14 - 54 U/L   Alkaline Phosphatase 96 38 - 126 U/L   Total Bilirubin 0.7 0.3 - 1.2 mg/dL   GFR calc non Af Amer >60 >60 mL/min   GFR calc Af Amer >60 >60 mL/min   Anion gap 8 5 - 15    Imaging Studies: US Transvaginal Non-ob  09/22/14   CLINICAL DATA:  Uterine fibroids. Nausea and vomiting. Gastric adenocarcinoma.  EXAM: TRANSABDOMINAL AND TRANSVAGINAL ULTRASOUND OF PELVIS   TECHNIQUE: Both transabdominal and transvaginal ultrasound examinations of the pelvis were performed. Transabdominal technique was performed for global imaging of the pelvis including uterus, ovaries, adnexal regions, and pelvic cul-de-sac. It was necessary to proceed with endovaginal exam following the transabdominal exam to visualize the endometrial stripe and ovaries.  COMPARISON:  06/05/2012  FINDINGS: Uterus  Measurements: 9.3 x 5.4 x 6.2 cm. Retroverted. An intramural fibroid is seen in the left posterior fundus measuring 2.6 x 2.5 x 2.6 cm. This has a submucosal component indenting the endometrium. This is mildly increased in size from 2.1 cm on previous study in 2014.  Endometrium  Thickness: 9 mm.  No focal abnormality visualized.  Right ovary  Measurements: 1.6 x 1.1 x 1.8 cm. Normal appearance/no adnexal mass.  Left ovary  Measurements: 3.6 x 2.4 x 3.2 cm. Normal appearance/no adnexal mass.  Other findings  No free fluid.  IMPRESSION: 2.6 cm left posterior fundal intramural fibroid with submucosal component, mildly increased in size compared to prior exam.  Normal appearance of both ovaries.  No adnexal mass identified.   Electronically Signed   By: Earle Gell M.D.   On: 09-22-14 11:44   US Pelvis Complete  2014/09/22   CLINICAL DATA:  Uterine fibroids. Nausea and vomiting. Gastric adenocarcinoma.  EXAM: TRANSABDOMINAL AND TRANSVAGINAL ULTRASOUND OF PELVIS  TECHNIQUE: Both transabdominal and transvaginal ultrasound examinations of the pelvis were performed. Transabdominal technique was performed for global imaging of the pelvis including uterus, ovaries, adnexal regions, and pelvic cul-de-sac. It was necessary to proceed with endovaginal exam following the transabdominal exam to visualize the endometrial stripe and ovaries.  COMPARISON:  06/05/2012  FINDINGS: Uterus  Measurements: 9.3 x 5.4 x 6.2 cm. Retroverted. An intramural fibroid is seen in the left posterior fundus measuring 2.6 x 2.5 x 2.6  cm. This has a submucosal component indenting the endometrium. This is mildly increased in size from  2.1 cm on previous study in 2014.  Endometrium  Thickness: 9 mm.  No focal abnormality visualized.  Right ovary  Measurements: 1.6 x 1.1 x 1.8 cm. Normal appearance/no adnexal mass.  Left ovary  Measurements: 3.6 x 2.4 x 3.2 cm. Normal appearance/no adnexal mass.  Other findings  No free fluid.  IMPRESSION: 2.6 cm left posterior fundal intramural fibroid with submucosal component, mildly increased in size compared to prior exam.  Normal appearance of both ovaries.  No adnexal mass identified.   Electronically Signed   By: Earle Gell M.D.   On: 09/15/2014 11:44   2:20 AM Patient drinking fluids without emesis after IV antiemetics. Patient was advised that she would not be receiving IV Dilaudid as we are concerned about its possible addictive properties. She was given Percocet by mouth with significant relief in her pain. She rates her pain as a 2 out of 10 now.  Shanon Rosser, MD 09/17/14 [phone]

## 2014-09-17 ENCOUNTER — Telehealth: Payer: Self-pay | Admitting: General Practice

## 2014-09-17 MED ORDER — PROMETHAZINE HCL 25 MG RE SUPP: 25.0000 mg | Freq: Four times a day (QID) | RECTAL | Status: DC | PRN

## 2014-09-17 MED ORDER — OXYCODONE-ACETAMINOPHEN 5-325 MG PO TABS
2.0000 | ORAL_TABLET | Freq: Once | ORAL | Status: AC
  Administered 2014-09-17: 2 via ORAL
  Filled 2014-09-17: qty 2

## 2014-09-17 NOTE — ED Notes (Addendum)
Discussion was had with Paula Paula [name] about pain control and she was informed that we would assist her with her current pain and nausea. She was urged to use the pain medications that she had been prescribed her last visit and that medication for nausea would be added so she could take her mediation w/o fear of N/V as this was the issue that brought her back to the ED this visit. Paula [name] further stated that she had not filled the Rx from her last visit and was taking the [place] she had been previously prescribe but was unable to keep the medication down d/t episodes of N/V after taking her medication for pain. Dr Florina Ou was notified and agreed to give her an Rx for nausea.

## 2014-09-17 NOTE — Telephone Encounter (Signed)
Patient called and left message on nurse line stating she is returning our call. Called patient, no answer- left message stating we are trying to reach you to return your call, please call us back at the clinics

## 2014-09-17 NOTE — Telephone Encounter (Signed)
Telephone call to patient regarding ultrasound results of small fibroid. Fibroid is likely not the source of patient's pain and nausea. Patient has appt on 8/4, in which same results would be discussed- may not need appt. Called patient, no answer- left message stating we are trying to reach you with non urgent results, please call us back at the clinics

## 2014-09-19 ENCOUNTER — Other Ambulatory Visit: Payer: Self-pay | Admitting: Gastroenterology

## 2014-09-19 DIAGNOSIS — R1084 Generalized abdominal pain: Secondary | ICD-10-CM

## 2014-09-24 ENCOUNTER — Inpatient Hospital Stay: Admission: RE | Admit: 2014-09-24 | Payer: Self-pay | Source: Ambulatory Visit

## 2014-09-25 ENCOUNTER — Encounter: Payer: Self-pay | Admitting: Obstetrics & Gynecology

## 2014-09-25 ENCOUNTER — Ambulatory Visit (INDEPENDENT_AMBULATORY_CARE_PROVIDER_SITE_OTHER): Payer: Medicaid Other | Admitting: Obstetrics & Gynecology

## 2014-09-25 VITALS — BP 105/69 | HR 85 | Temp 97.5°F | Wt 109.4 lb

## 2014-09-25 DIAGNOSIS — D259 Leiomyoma of uterus, unspecified: Secondary | ICD-10-CM

## 2014-09-25 DIAGNOSIS — N924 Excessive bleeding in the premenopausal period: Secondary | ICD-10-CM | POA: Diagnosis not present

## 2014-09-25 DIAGNOSIS — N951 Menopausal and female climacteric states: Secondary | ICD-10-CM

## 2014-09-25 LAB — POCT PREGNANCY, URINE: Preg Test, Ur: NEGATIVE

## 2014-09-25 MED ORDER — MEDROXYPROGESTERONE ACETATE 150 MG/ML IM SUSP
150.0000 mg | INTRAMUSCULAR | Status: DC
  Administered 2014-09-25 – 2015-06-23 (×5): 150 mg via INTRAMUSCULAR

## 2014-09-25 NOTE — Patient Instructions (Signed)
Return to clinic for any scheduled appointments or for any gynecologic concerns as needed.   

## 2014-09-25 NOTE — Progress Notes (Signed)
CLINIC ENCOUNTER NOTE  History:  49 y.o. F with history of gastric cancer here today for fibroids, AUB and vasomotor symptoms. She attributes these to being around the time of menopause. Reports having chronic abdominal and pelvic pain, but has been treated multiple times for this in the past as a result of gastric cancer.  Has regular periods, but feels that the flow varies recently and she had light-moderate flow most days. Feels tired, recent hemoglobin was 11.3 on 09/16/14.  Also feels hot and has night sweats, nausea during her periods.    Past Medical History  Diagnosis Date  . Headache(784.0) 2011    Migraines  . Esophageal reflux     On omeprazole  . Costochondritis   . Hypertension     started around 2011  . Ovarian cyst   . Stomach cancer   . Family history of cancer   . Heart murmur   . On antineoplastic chemotherapy     5-FU and leucovorin    Past Surgical History  Procedure Laterality Date  . Oophorectomy      Around 1985 left ovary removal  . Tonsillectomy      Around 1994  . Tubal ligation    . Esophagogastroduodenoscopy N/A 09/16/2013    Procedure: ESOPHAGOGASTRODUODENOSCOPY (EGD);  Surgeon: Wonda Horner, MD;  Location: [facility];  Service: Endoscopy;  Laterality: N/A;  . Laparoscopy N/A 09/20/2013    Procedure: DIAGNOSTIC LAPAROSCOPY,DISTAL GASTRECTOMY AND FEEDING GASTROJEJUNOSTOMY;  Surgeon: [name], MD;  Location: [facility];  Service: General;  Laterality: N/A;  . Portacath placement Left 11/18/2013    Procedure: INSERTION PORT-A-CATH;  Surgeon: [name], MD;  Location: [facility];  Service: General;  Laterality: Left;   The following portions of the patient's history were reviewed and updated as appropriate: allergies, current medications, past family history, past medical history, past social history, past surgical history and problem list.   Health Maintenance:  Normal pap last year.  Normal mammogram in 2013.   Review of Systems:  Pertinent items  are noted in HPI. Comprehensive review of systems was otherwise negative.  Objective:  Physical Exam BP 105/69 mmHg  Pulse 85  Temp(Src) 97.5 F (36.4 C)  Wt 109 lb 6.4 oz (49.624 kg)  SpO2 100%  LMP 09/25/2014 CONSTITUTIONAL: Well-developed, well-nourished female in no acute distress.  HENT:  Normocephalic, atraumatic. External right and left ear normal. Oropharynx is clear and moist EYES: Conjunctivae and EOM are normal. Pupils are equal, round, and reactive to light. No scleral icterus.  NECK: Normal range of motion, supple, no masses SKIN: Skin is warm and dry. No rash noted. Not diaphoretic. No erythema. No pallor. [place]: Alert and oriented to person, place, and time. Normal reflexes, muscle tone coordination. No cranial nerve deficit noted. PSYCHIATRIC: Normal mood and affect. Normal behavior. Normal judgment and thought content. CARDIOVASCULAR: Normal heart rate noted RESPIRATORY: Effort and breath sounds normal, no problems with respiration noted ABDOMEN: Soft, no distention noted.   PELVIC: Deferred MUSCULOSKELETAL: Normal range of motion. No edema noted.  Labs and Imaging US Transvaginal Non-ob  09/15/2014   CLINICAL DATA:  Uterine fibroids. Nausea and vomiting. Gastric adenocarcinoma.  EXAM: TRANSABDOMINAL AND TRANSVAGINAL ULTRASOUND OF PELVIS  TECHNIQUE: Both transabdominal and transvaginal ultrasound examinations of the pelvis were performed. Transabdominal technique was performed for global imaging of the pelvis including uterus, ovaries, adnexal regions, and pelvic cul-de-sac. It was necessary to proceed with endovaginal exam following the transabdominal exam to visualize the endometrial stripe and ovaries.  COMPARISON:  06/05/2012  FINDINGS: Uterus  Measurements: 9.3 x 5.4 x 6.2 cm. Retroverted. An intramural fibroid is seen in the left posterior fundus measuring 2.6 x 2.5 x 2.6 cm. This has a submucosal component indenting the endometrium. This is mildly increased in  size from 2.1 cm on previous study in 2014.  Endometrium  Thickness: 9 mm.  No focal abnormality visualized.  Right ovary  Measurements: 1.6 x 1.1 x 1.8 cm. Normal appearance/no adnexal mass.  Left ovary  Measurements: 3.6 x 2.4 x 3.2 cm. Normal appearance/no adnexal mass.  Other findings  No free fluid.  IMPRESSION: 2.6 cm left posterior fundal intramural fibroid with submucosal component, mildly increased in size compared to prior exam.  Normal appearance of both ovaries.  No adnexal mass identified.   Electronically Signed   By: Earle Gell M.D.   On: 09/15/2014 11:44   US Pelvis Complete  09/15/2014   CLINICAL DATA:  Uterine fibroids. Nausea and vomiting. Gastric adenocarcinoma.  EXAM: TRANSABDOMINAL AND TRANSVAGINAL ULTRASOUND OF PELVIS  TECHNIQUE: Both transabdominal and transvaginal ultrasound examinations of the pelvis were performed. Transabdominal technique was performed for global imaging of the pelvis including uterus, ovaries, adnexal regions, and pelvic cul-de-sac. It was necessary to proceed with endovaginal exam following the transabdominal exam to visualize the endometrial stripe and ovaries.  COMPARISON:  06/05/2012  FINDINGS: Uterus  Measurements: 9.3 x 5.4 x 6.2 cm. Retroverted. An intramural fibroid is seen in the left posterior fundus measuring 2.6 x 2.5 x 2.6 cm. This has a submucosal component indenting the endometrium. This is mildly increased in size from 2.1 cm on previous study in 2014.  Endometrium  Thickness: 9 mm.  No focal abnormality visualized.  Right ovary  Measurements: 1.6 x 1.1 x 1.8 cm. Normal appearance/no adnexal mass.  Left ovary  Measurements: 3.6 x 2.4 x 3.2 cm. Normal appearance/no adnexal mass.  Other findings  No free fluid.  IMPRESSION: 2.6 cm left posterior fundal intramural fibroid with submucosal component, mildly increased in size compared to prior exam.  Normal appearance of both ovaries.  No adnexal mass identified.   Electronically Signed   By: Earle Gell  M.D.   On: 09/15/2014 11:44    Assessment & Plan:  Discussed medical management options for abnormal uterine bleeding oral progesterone, Depo Provera, Mirena IUD.  Discussed risks and benefits of each method.   Patient desires Depo Provera for now; likes that it may stimulate her appetite and may cause period suppression.  Depo Provera given today.  Bleeding precautions reviewed.  PCP will need to refer her for mammogram. Routine preventative health maintenance measures emphasized. Please refer to After Visit Summary for other counseling recommendations.  Follow up in 3 months, will do pap smear at this visit.    Verita Schneiders, MD, [place] Attending [place] for [employer], [place]

## 2014-09-26 ENCOUNTER — Other Ambulatory Visit: Payer: Self-pay

## 2014-10-01 ENCOUNTER — Telehealth: Payer: Self-pay | Admitting: *Deleted

## 2014-10-01 NOTE — Telephone Encounter (Signed)
TC from patient to check on date and time of upcoming CT scan. Reviewed appts with pt. She has lab appt on 10/08/14 @ 9am and then CT Scan is at 10 am. Pt voiced understanding. No other needs identified.

## 2014-10-02 ENCOUNTER — Telehealth: Payer: Self-pay | Admitting: Internal Medicine

## 2014-10-02 NOTE — Telephone Encounter (Signed)
Returned call to patient re when she can get an appointment with MM. Patient already on schedule for 3 month f/u since May 2016. Left message for patient re appointments for lab/ct 8/17 and MM 8/23. Schedule mailed.

## 2014-10-06 ENCOUNTER — Telehealth: Payer: Self-pay | Admitting: *Deleted

## 2014-10-06 DIAGNOSIS — C169 Malignant neoplasm of stomach, unspecified: Secondary | ICD-10-CM

## 2014-10-06 MED ORDER — OXYCODONE-ACETAMINOPHEN 7.5-325 MG PO TABS: 1.0000 | ORAL_TABLET | ORAL | Status: DC | PRN

## 2014-10-06 NOTE — Telephone Encounter (Addendum)
Please refill patient pain medication. Pain in back and where feeding tube was. Patient would like to be notified once it is ready for pick up. Message forwarded to [place] RN

## 2014-10-06 NOTE — Addendum Note (Signed)
Addended by: [name] on: 10/06/2014 02:54 PM   Modules accepted: Orders, Medications

## 2014-10-08 ENCOUNTER — Telehealth: Payer: Self-pay | Admitting: *Deleted

## 2014-10-08 ENCOUNTER — Other Ambulatory Visit: Payer: Self-pay

## 2014-10-08 ENCOUNTER — Encounter (HOSPITAL_COMMUNITY): Payer: Medicaid Other

## 2014-10-08 NOTE — Telephone Encounter (Signed)
Received message from pt requesting a call back from nurse.  Spoke with pt and was informed that pt received certified mail that Medicaid had denied upcoming CT scans - due to not enough information given. Vaughan Basta, pre-cert notified. Instructed pt to bring in letter on 8/18 and asked to speak with Benedetto Goad, pre- cert specialist. Message sent to Dr. [name] and collaborative nurse. Pt's   Phone    [phone].

## 2014-10-10 ENCOUNTER — Encounter: Payer: Self-pay | Admitting: Internal Medicine

## 2014-10-10 ENCOUNTER — Telehealth: Payer: Self-pay | Admitting: Medical Oncology

## 2014-10-10 NOTE — Progress Notes (Unsigned)
I spoke with the patient and confirmed what we talked about on Thursday RE: scan denial.  [name] voiced understanding that i will submit the new clinical note on Tuesday after her visit with Dr. [name] and hopefully the scan will be authorized.  Benedetto Goad

## 2014-10-10 NOTE — Telephone Encounter (Signed)
Pt was instructed to keep appt and will discuss plan of care.

## 2014-10-13 ENCOUNTER — Ambulatory Visit (HOSPITAL_COMMUNITY): Payer: Medicaid Other

## 2014-10-14 ENCOUNTER — Encounter: Payer: Self-pay | Admitting: Internal Medicine

## 2014-10-14 ENCOUNTER — Ambulatory Visit (HOSPITAL_BASED_OUTPATIENT_CLINIC_OR_DEPARTMENT_OTHER): Payer: Medicaid Other | Admitting: Internal Medicine

## 2014-10-14 ENCOUNTER — Telehealth: Payer: Self-pay | Admitting: Internal Medicine

## 2014-10-14 VITALS — BP 133/84 | HR 65 | Temp 98.7°F | Resp 18 | Ht 60.0 in | Wt 113.8 lb

## 2014-10-14 DIAGNOSIS — C779 Secondary and unspecified malignant neoplasm of lymph node, unspecified: Secondary | ICD-10-CM | POA: Diagnosis not present

## 2014-10-14 DIAGNOSIS — G4700 Insomnia, unspecified: Secondary | ICD-10-CM

## 2014-10-14 DIAGNOSIS — R52 Pain, unspecified: Secondary | ICD-10-CM

## 2014-10-14 DIAGNOSIS — C169 Malignant neoplasm of stomach, unspecified: Secondary | ICD-10-CM

## 2014-10-14 DIAGNOSIS — R110 Nausea: Secondary | ICD-10-CM

## 2014-10-14 DIAGNOSIS — R112 Nausea with vomiting, unspecified: Secondary | ICD-10-CM

## 2014-10-14 MED ORDER — OXYCODONE-ACETAMINOPHEN 7.5-325 MG PO TABS: 1.0000 | ORAL_TABLET | ORAL | Status: DC | PRN

## 2014-10-14 MED ORDER — PROMETHAZINE HCL 25 MG PO TABS: 25.0000 mg | ORAL_TABLET | Freq: Four times a day (QID) | ORAL | Status: DC | PRN

## 2014-10-14 NOTE — Progress Notes (Signed)
[facility] Telephone:([phone]) [phone]   [phone]  OFFICE PROGRESS NOTE  Arnoldo Morale, MD [place] [address]  DIAGNOSIS: Stage IIA (T1a, N2, M0) gastric adenocarcinoma diagnosed in July of 2015  PRIOR THERAPY: 1) Status post diagnostic laparoscopy, distal gastrectomy with billroth II anastamosis and feeding gastrojejunostomy tube under the care of Dr. Barry Dienes on 09/20/2013. 2) Adjuvant systemic chemotherapy with 5-FU 600 mg/M2 with leucovorin on days 1, 8, 15, 22, 29 and 36 every 8 weeks. First dose 11/19/2013. 3) Adjuvant concurrent chemoradiation with Xeloda 650 MG/M2 by mouth twice a day for 5 days every week with radiation. The patient completed the course of radiotherapy but she declined to take Xeloda during her radiation.  CURRENT THERAPY: None.  INTERVAL HISTORY: Paula [name] 49 y.o. female returns to the clinic today for followup visit accompanied by her husband. The patient has been on observation for the last few months. She has been doing fine and recently with no specific complaints except for occasional abdominal pain at the gastric tube incision site as well as the surgical scar. She was evaluated by Dr. Penelope Coop for persistent nausea and vomiting and she had repeat upper endoscopy that showed no evidence for recurrent disease. She denied having any significant fever or chills, but has occasional nausea with no vomiting. She denied having any significant shortness of breath or hemoptysis. She has no significant weight loss or night sweats. I will order CT scan of the abdomen for evaluation of her disease but unfortunately this was denied by her insurance.  MEDICAL HISTORY: Past Medical History  Diagnosis Date  . Headache(784.0) 2011    Migraines  . Esophageal reflux     On omeprazole  . Costochondritis   . Hypertension     started around 2011  . Ovarian cyst   . Stomach cancer   . Family history of cancer   .  Heart murmur   . On antineoplastic chemotherapy     5-FU and leucovorin    ALLERGIES:  has No Known Allergies.  MEDICATIONS:  Current Outpatient Prescriptions  Medication Sig Dispense Refill  . amoxicillin-clarithromycin-lansoprazole (PREVPAC) combo pack U UTD FOR 14 DAYS  0  . CARAFATE 1 GM/10ML suspension TAKE 10ML PO QID FOR 30 DAYS  2  . carvedilol (COREG) 6.25 MG tablet Take 6.25 mg by mouth 2 (two) times daily with a meal.    . lidocaine-prilocaine (EMLA) cream Apply 1 application topically as needed. Apply to port 1 hour before chemo 30 g 0  . methylPREDNISolone (MEDROL DOSEPAK) 4 MG TBPK tablet FPD  0  . metoCLOPramide (REGLAN) 5 MG/5ML solution TAKE 5 ML PO Q 6 H.  2  . Multiple Vitamins-Minerals (MULTIVITAMIN & MINERAL PO) Take 1 tablet by mouth daily.    [place] oxyCODONE-acetaminophen (PERCOCET) 7.5-325 MG per tablet Take 1 tablet by mouth every 4 (four) hours as needed for severe pain (for pain). 6 tablet 0  . ondansetron (ZOFRAN) 4 MG tablet Take 1 tablet (4 mg total) by mouth every 6 (six) hours. (Patient not taking: Reported on 10/14/2014) 12 tablet 0  . promethazine (PHENERGAN) 25 MG suppository Place 1 suppository (25 mg total) rectally every 6 (six) hours as needed for nausea or vomiting. (Patient not taking: Reported on 10/14/2014) 12 suppository 0  . promethazine (PHENERGAN) 25 MG tablet Take 1 tablet (25 mg total) by mouth every 6 (six) hours as needed for nausea or vomiting. (Patient not taking: Reported  on 09/25/2014) 12 tablet 0  . [DISCONTINUED] pantoprazole (PROTONIX) 40 MG tablet Take 1 tablet (40 mg total) by mouth 2 (two) times daily before a meal. (Patient not taking: Reported on 07/18/2014) 60 tablet 3  . [DISCONTINUED] temazepam (RESTORIL) 30 MG capsule Take 1 capsule (30 mg total) by mouth at bedtime as needed for sleep. (Patient not taking: Reported on 07/18/2014) 30 capsule 0   Current Facility-Administered Medications  Medication Dose Route Frequency Provider Last  Rate Last Dose  . medroxyPROGESTERone (DEPO-PROVERA) injection 150 mg  150 mg Intramuscular Q90 days Osborne Oman, MD   150 mg at 09/25/14 1602    SURGICAL HISTORY:  Past Surgical History  Procedure Laterality Date  . Oophorectomy      Around 1985 left ovary removal  . Tonsillectomy      Around 1994  . Tubal ligation    . Esophagogastroduodenoscopy N/A 09/16/2013    Procedure: ESOPHAGOGASTRODUODENOSCOPY (EGD);  Surgeon: Wonda Horner, MD;  Location: [facility];  Service: Endoscopy;  Laterality: N/A;  . Laparoscopy N/A 09/20/2013    Procedure: DIAGNOSTIC LAPAROSCOPY,DISTAL GASTRECTOMY AND FEEDING GASTROJEJUNOSTOMY;  Surgeon: [name], MD;  Location: [facility];  Service: General;  Laterality: N/A;  . Portacath placement Left 11/18/2013    Procedure: INSERTION PORT-A-CATH;  Surgeon: [name], MD;  Location: [facility];  Service: General;  Laterality: Left;    REVIEW OF SYSTEMS:  A comprehensive review of systems was negative except for: Constitutional: positive for fatigue Gastrointestinal: positive for nausea   PHYSICAL EXAMINATION: General appearance: alert, cooperative, fatigued and no distress Head: Normocephalic, without obvious abnormality, atraumatic Neck: no adenopathy, no JVD, supple, symmetrical, trachea midline and thyroid not enlarged, symmetric, no tenderness/mass/nodules Lymph nodes: Cervical, supraclavicular, and axillary nodes normal. Resp: clear to auscultation bilaterally Back: symmetric, no curvature. ROM normal. No CVA tenderness. Cardio: regular rate and rhythm, S1, S2 normal, no murmur, click, rub or gallop GI: soft, non-tender; bowel sounds normal; no masses,  no organomegaly Extremities: extremities normal, atraumatic, no cyanosis or edema Neurologic: Alert and oriented X 3, normal strength and tone. Normal symmetric reflexes. Normal coordination and gait  ECOG PERFORMANCE STATUS: 1 - Symptomatic but completely ambulatory  Blood pressure 133/84, pulse 65,  temperature 98.7 F (37.1 C), temperature source Oral, resp. rate 18, height 5' (1.524 m), weight 113 lb 12.8 oz (51.619 kg), last menstrual period 09/25/2014, SpO2 100 %.  LABORATORY DATA: Lab Results  Component Value Date   WBC 5.5 09/16/2014   HGB 11.3* 09/16/2014   HCT 34.7* 09/16/2014   MCV 88.3 09/16/2014   PLT 254 09/16/2014      Chemistry      Component Value Date/Time   NA 135 09/16/2014 0351   NA 143 06/30/2014 0846   K 3.9 09/16/2014 0351   K 4.0 06/30/2014 0846   CL 101 09/16/2014 0351   CO2 26 09/16/2014 0351   CO2 29 06/30/2014 0846   BUN 7 09/16/2014 0351   BUN 6.2* 06/30/2014 0846   CREATININE 0.76 09/16/2014 0351   CREATININE 0.8 06/30/2014 0846      Component Value Date/Time   CALCIUM 8.9 09/16/2014 0351   CALCIUM 9.2 06/30/2014 0846   ALKPHOS 96 09/16/2014 0351   ALKPHOS 57 06/30/2014 0846   AST 27 09/16/2014 0351   AST 30 06/30/2014 0846   ALT 17 09/16/2014 0351   ALT 25 06/30/2014 0846   BILITOT 0.7 09/16/2014 0351   BILITOT 0.53 06/30/2014 0846       RADIOGRAPHIC STUDIES:  ASSESSMENT AND PLAN: this is a very pleasant 49 years old [place] American female recently diagnosed with a stage IIA gastric adenocarcinoma status post resection with lymph node dissection. She completed the first 2 cycles adjuvant chemotherapy with 5-FU and leucovorin and tolerated her treatment fairly well except for neutropenia and mild nausea. This was followed by a course of adjuvant radiotherapy and the patient was unable to tolerate the concurrent chemotherapy with Xeloda which was discontinued. She is currently on observation. Unfortunately her insurance decline the CT scan of the abdomen and pelvis that was required for restaging of her disease. I recommended for the patient to continue on observation for now. I will order repeat CT scan of the abdomen and pelvis in 3 months for reevaluation of her condition. For pain management, she will continue on her current pain  medication and she was giving a refill of Percocet. For insomnia, she will continue on this treatment for now. For nausea the patient was given a refill of Phenergan. She was advised to call immediately if she has any concerning symptoms in the interval.  The patient voices understanding of current disease status and treatment options and is in agreement with the current care plan.  All questions were answered. The patient knows to call the clinic with any problems, questions or concerns. We can certainly see the patient much sooner if necessary.  Disclaimer: This note was dictated with voice recognition software. Similar sounding words can inadvertently be transcribed and may not be corrected upon review.

## 2014-10-14 NOTE — Telephone Encounter (Signed)
s.w. pt and advised on OCT appt....pt ok and aware °

## 2014-10-15 ENCOUNTER — Ambulatory Visit: Payer: Self-pay | Admitting: Internal Medicine

## 2014-10-29 ENCOUNTER — Telehealth: Payer: Self-pay | Admitting: Internal Medicine

## 2014-10-29 ENCOUNTER — Telehealth: Payer: Self-pay | Admitting: *Deleted

## 2014-10-29 DIAGNOSIS — C169 Malignant neoplasm of stomach, unspecified: Secondary | ICD-10-CM

## 2014-10-29 NOTE — Telephone Encounter (Signed)
Patient called stating that she has been having severe nausea/vomiting since Labor day. Patient observed a knot located where she had a feeding tube placed and removed last year. Patient given the option of coming in today to see symptom management but stated that "its too hot to come out there". POF sent to schedule her to see symptom management 10/30/14.

## 2014-10-29 NOTE — Telephone Encounter (Signed)
Called patient and she is aware of her 9/8 appointment

## 2014-10-30 ENCOUNTER — Ambulatory Visit (HOSPITAL_BASED_OUTPATIENT_CLINIC_OR_DEPARTMENT_OTHER): Payer: Medicaid Other | Admitting: Nurse Practitioner

## 2014-10-30 ENCOUNTER — Telehealth: Payer: Self-pay | Admitting: Nurse Practitioner

## 2014-10-30 ENCOUNTER — Encounter: Payer: Self-pay | Admitting: Nurse Practitioner

## 2014-10-30 VITALS — BP 141/87 | HR 79 | Temp 98.6°F | Resp 18 | Ht 60.0 in | Wt 116.4 lb

## 2014-10-30 DIAGNOSIS — C169 Malignant neoplasm of stomach, unspecified: Secondary | ICD-10-CM | POA: Diagnosis not present

## 2014-10-30 DIAGNOSIS — R112 Nausea with vomiting, unspecified: Secondary | ICD-10-CM | POA: Diagnosis not present

## 2014-10-30 NOTE — Progress Notes (Signed)
SYMPTOM MANAGEMENT CLINIC   HPI: Paula [name] 49 y.o. female diagnosed with gastric cancer.  Patient is status post chemotherapy and radiation therapy.  Currently undergoing observation only.   Patient last received 5-FU/leucovorin chemotherapy in January 2016.  She completed radiation treatments in April 2016. Chart notes state that patient refused Xeloda oral therapy.  Patient's PEG tube was removed in October 2015.  Patient reports intermittent, chronic nausea and vomiting again this past week.  Patient states she vomited approximate 10 different times on Monday, 10/27/2014.  She last vomited yesterday morning.  10/30/2014.  Patient states that she takes phenergran for her nausea/vomiting; but does seem to help.  Patient denies any abdominal discomfort or cramping.  She is reports that her bowels are working regularly.  She denies any recent fevers or chills.  Patient confirmed that she was able to eat and drink within the last 24 hours with no difficulty.  Patient has a history of chronic nausea/vomiting; and has undergone 2 different upper endoscopies for the same symptoms per Dr. Penelope Coop, gastroenterologist.  Patient reports that the gastroenterologist could find no actual calls for the chronic nausea and vomiting.  Patient also reports that she felt the nausea/vomiting worsened around the times of her period; so she has underwent Depot injections to prevent her periods.  HPI  ROS  Past Medical History  Diagnosis Date  . Headache(784.0) 2011    Migraines  . Esophageal reflux     On omeprazole  . Costochondritis   . Hypertension     started around 2011  . Ovarian cyst   . Stomach cancer   . Family history of cancer   . Heart murmur   . On antineoplastic chemotherapy     5-FU and leucovorin    Past Surgical History  Procedure Laterality Date  . Oophorectomy      Around 1985 left ovary removal  . Tonsillectomy      Around 1994  . Tubal ligation    .  Esophagogastroduodenoscopy N/A 09/16/2013    Procedure: ESOPHAGOGASTRODUODENOSCOPY (EGD);  Surgeon: Wonda Horner, MD;  Location: [facility];  Service: Endoscopy;  Laterality: N/A;  . Laparoscopy N/A 09/20/2013    Procedure: DIAGNOSTIC LAPAROSCOPY,DISTAL GASTRECTOMY AND FEEDING GASTROJEJUNOSTOMY;  Surgeon: [name], MD;  Location: [facility];  Service: General;  Laterality: N/A;  . 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; Malnutrition of moderate degree; Back pain; Intractable nausea and vomiting; Protein-calorie malnutrition, severe; Anemia, unspecified; Pain of left upper arm; Gastric atony; Constipation, chronic; Chronic low back pain; Acute kidney injury; Palliative care encounter; Nausea & vomiting; Abdominal pain, left lower quadrant; Somatization disorder; Cancer of antrum of stomach; Family history of cancer; Weight loss; PEG tube malfunction; Dehiscence of incision; Hyponatremia; Hypokalemia; Other pancytopenia; Neuropathy due to chemotherapeutic drug; Hypoalbuminemia; Malfunction of device; Leg edema; Neoplasm related pain; Neutropenia; and Insomnia on her problem list.    has No Known Allergies.    Medication List       This list is accurate as of: 10/30/14  2:15 PM.  Always use your most recent med list.               carvedilol 6.25 MG tablet  Commonly known as:  COREG  Take 6.25 mg by mouth 2 (two) times daily with a meal.     MULTIVITAMIN & MINERAL PO  Take 1 tablet by mouth daily.     oxyCODONE-acetaminophen 7.5-325 MG per tablet  Commonly known as:  PERCOCET  Take 1 tablet by mouth every 4 (four) hours as needed  for severe pain (for pain).     promethazine 25 MG suppository  Commonly known as:  PHENERGAN  Place 1 suppository (25 mg total) rectally every 6 (six) hours as needed for nausea or vomiting.     promethazine 25 MG tablet  Commonly known as:  PHENERGAN  Take 1 tablet (25 mg total) by mouth every 6 (six) hours as needed for nausea or vomiting.         PHYSICAL EXAMINATION  Oncology Vitals 10/30/2014 10/14/2014 09/25/2014 09/17/2014 09/17/2014 09/16/2014 09/16/2014  Height 152 cm 152 cm - - - - 152 cm  Weight 52.799 kg 51.619 kg 49.624 kg - - - 51.71 kg  Weight (lbs) 116 lbs 6 oz 113 lbs 13 oz 109 lbs 6 oz - - - 114 lbs  BMI (kg/m2) 22.73 kg/m2 22.22 kg/m2 - - - - 22.26 kg/m2  Temp 98.6 98.7 97.5 98.4 98.3 97.7 98.4  Pulse 79 65 85 103 106 94 90  Resp 18 18 - 18 18 18 20   SpO2 100 100 100 100 100 96 100  BSA (m2) 1.5 m2 1.48 m2 - - - - 1.48 m2   BP Readings from Last 3 Encounters:  10/30/14 141/87  10/14/14 133/84  09/25/14 105/69    Physical Exam  Constitutional: She is oriented to person, place, and time and well-developed, well-nourished, and in no distress.  HENT:  Head: Normocephalic and atraumatic.  Eyes: Conjunctivae and EOM are normal. Pupils are equal, round, and reactive to light. Right eye exhibits no discharge. Left eye exhibits no discharge. No scleral icterus.  Neck: Normal range of motion.  Pulmonary/Chest: Effort normal. No respiratory distress.  Abdominal: Soft. Bowel sounds are normal. She exhibits no distension and no mass. There is no tenderness. There is no rebound and no guarding.  Exam revealed abdomen soft and nontender with palpation.  Trace/questionable scar tissue palpated directly above the left PEG tube insertion site has completely healed.  Bowel sounds positive in all 4 quads.  No flank pain.  Patient does not appear dehydrated or uncomfortable today.       Musculoskeletal: Normal range of motion. She exhibits no edema or tenderness.  Neurological:  She is alert and oriented to person, place, and time. Gait normal.  Nursing note and vitals reviewed.   LABORATORY DATA:. No visits with results within 3 Day(s) from this visit. Latest known visit with results is:  Office Visit on 09/25/2014  Component Date Value Ref Range Status  . Preg Test, Ur 09/25/2014 NEGATIVE  NEGATIVE Final   Comment:        THE SENSITIVITY OF THIS METHODOLOGY IS >24 mIU/mL      RADIOGRAPHIC STUDIES: No results found.  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  She completed radiation treatments in April 2016. Chart notes state that patient refused Xeloda oral therapy.  Patient's PEG tube was removed in October 2015.  Patient reports intermittent, chronic nausea and vomiting again this past week.  Patient states she vomited approximate 10 different times on Monday, 10/27/2014.  She last vomited yesterday  morning.  10/30/2014.  Patient states that she takes phenergran for her nausea/vomiting; but does seem to help.  Patient denies any abdominal discomfort or cramping.  She is reports that her bowels are working regularly.  She denies any recent fevers or chills.  Patient has a history of chronic nausea/vomiting; and has undergone 2 different upper endoscopies for the same symptoms per Dr. Penelope Coop, gastroenterologist.  Patient reports that the gastroenterologist could find no actual calls for the chronic nausea and vomiting.  Patient also reports that she felt the nausea/vomiting worsened around the times of her period; so she has underwent Depot injections to prevent her periods.  On discussion with the patient in regards to continued need for restaging CT with contrast of the chest/abdomen/pelvis.  Apparently patient has been denied.  Restaging scans per her insurance on multiple occasions.  Patient has labs and a restaging CT with contrast of the  chest/abdomen/pelvis and scheduled for 12/15/2014.  She is scheduled for follow-up visit with Dr. [name] on 12/22/2014.  Reviewed planned restaging scan with both Dr. [name] and financial counselor; and was informed that actual approval for any restaging scans occurs approximately 2 weeks prior to the scheduled scan.  Reviewed this information with the patient in detail.    Nausea & vomiting Patient last received 5-FU/leucovorin chemotherapy in January 2016.  She completed radiation treatments in April 2016. Chart notes state that patient refused Xeloda oral therapy.  Patient's PEG tube was removed in October 2015.  Patient reports intermittent, chronic nausea and vomiting again this past week.  Patient states she vomited approximate 10 different times on Monday, 10/27/2014.  She last vomited yesterday morning.  10/30/2014.  Patient states that she takes phenergran for her nausea/vomiting; but does seem to help.  Patient denies any abdominal discomfort or cramping.  She is reports that her bowels are working regularly.  She denies any recent fevers or chills.  Exam revealed abdomen soft and nontender with palpation.  Trace/questionable scar tissue palpated directly above the left PEG tube insertion site has completely healed.  Bowel sounds positive in all 4 quads.  No flank pain.  Patient does not appear dehydrated or uncomfortable today.  Patient confirmed that she was able to eat and drink within the last 24 hours with no difficulty.  Patient has a history of chronic nausea/vomiting; and has undergone 2 different upper endoscopies for the same symptoms per Dr. Penelope Coop, gastroenterologist.  Patient reports that the gastroenterologist could find no actual calls for the chronic nausea and vomiting.  Patient also reports that she felt the nausea/vomiting worsened around the times of her period; so she has underwent Depot injections to prevent her periods.  On discussion with the patient in regards  to continued need for restaging CT with contrast of the chest/abdomen/pelvis.  Apparently patient has been denied.  Restaging scans per her insurance on multiple occasions.  Patient has labs and a restaging CT with contrast of the chest/abdomen/pelvis and scheduled for 12/15/2014.  She is scheduled for follow-up visit with Dr. [name] on 12/22/2014.  Reviewed planned restaging scan with both Dr. [name] and financial counselor; and was informed that actual approval for any restaging scans occurs approximately 2 weeks prior to the scheduled scan.  Reviewed this information with the patient in detail.    In the meantime-patient was advised to go directly to the emergency department if she once again develops any nausea, vomiting, abdominal discomfort or fever/chills.  Patient stated understanding of all instructions; and was in agreement  with this plan of care. The patient knows to call the clinic with any problems, questions or concerns.   Review/collaboration with Dr. [name] regarding all aspects of patient's visit today.   Total time spent with patient was 25 minutes;  with greater than 75 percent of that time spent in face to face counseling regarding patient's symptoms,  and coordination of care and follow up.  Disclaimer:This dictation was prepared with Dragon/digital dictation along with [employer]. Any transcriptional errors that result from this process are unintentional.  Drue Second, NP 10/30/2014

## 2014-10-30 NOTE — Telephone Encounter (Signed)
per pof to sch flush-gave pt copy of avs

## 2014-10-30 NOTE — Assessment & Plan Note (Addendum)
Patient last received 5-FU/leucovorin chemotherapy in January 2016.  She completed radiation treatments in April 2016. Chart notes state that patient refused Xeloda oral therapy.  Patient's PEG tube was removed in October 2015.  Patient reports intermittent, chronic nausea and vomiting again this past week.  Patient states she vomited approximate 10 different times on Monday, 10/27/2014.  She last vomited yesterday morning.  10/30/2014.  Patient states that she takes phenergran for her nausea/vomiting; but does seem to help.  Patient denies any abdominal discomfort or cramping.  She is reports that her bowels are working regularly.  She denies any recent fevers or chills.  Patient has a history of chronic nausea/vomiting; and has undergone 2 different upper endoscopies for the same symptoms per Dr. Penelope Coop, gastroenterologist.  Patient reports that the gastroenterologist could find no actual calls for the chronic nausea and vomiting.  Patient also reports that she felt the nausea/vomiting worsened around the times of her period; so she has underwent Depot injections to prevent her periods.  Long discussion with the patient in regards to continued need for restaging CT with contrast of the chest/abdomen/pelvis.  Apparently patient has been denied.  Restaging scans per her insurance on multiple occasions.  Patient has labs and a restaging CT with contrast of the chest/abdomen/pelvis and scheduled for 12/15/2014.  She is scheduled for follow-up visit with Dr. [name] on 12/22/2014.  Reviewed planned restaging scan with both Dr. [name] and financial counselor; and was informed that actual approval for any restaging scans occurs approximately 2 weeks prior to the scheduled scan.  Reviewed this information with the patient in detail.

## 2014-10-30 NOTE — Assessment & Plan Note (Addendum)
Patient last received 5-FU/leucovorin chemotherapy in January 2016.  She completed radiation treatments in April 2016. Chart notes state that patient refused Xeloda oral therapy.  Patient's PEG tube was removed in October 2015.  Patient reports intermittent, chronic nausea and vomiting again this past week.  Patient states she vomited approximate 10 different times on Monday, 10/27/2014.  She last vomited yesterday morning.  10/30/2014.  Patient states that she takes phenergran for her nausea/vomiting; but does seem to help.  Patient denies any abdominal discomfort or cramping.  She is reports that her bowels are working regularly.  She denies any recent fevers or chills.  Exam revealed abdomen soft and nontender with palpation.  Trace/questionable scar tissue palpated directly above the left PEG tube insertion site has completely healed.  Bowel sounds positive in all 4 quads.  No flank pain.  Patient does not appear dehydrated or uncomfortable today.  Patient confirmed that she was able to eat and drink within the last 24 hours with no difficulty.  Patient has a history of chronic nausea/vomiting; and has undergone 2 different upper endoscopies for the same symptoms per Dr. Penelope Coop, gastroenterologist.  Patient reports that the gastroenterologist could find no actual calls for the chronic nausea and vomiting.  Patient also reports that she felt the nausea/vomiting worsened around the times of her period; so she has underwent Depot injections to prevent her periods.  Long discussion with the patient in regards to continued need for restaging CT with contrast of the chest/abdomen/pelvis.  Apparently patient has been denied.  Restaging scans per her insurance on multiple occasions.  Patient has labs and a restaging CT with contrast of the chest/abdomen/pelvis and scheduled for 12/15/2014.  She is scheduled for follow-up visit with Dr. [name] on 12/22/2014.  Reviewed planned restaging scan with both  Dr. [name] and financial counselor; and was informed that actual approval for any restaging scans occurs approximately 2 weeks prior to the scheduled scan.  Reviewed this information with the patient in detail.    In the meantime-patient was advised to go directly to the emergency department if she once again develops any nausea, vomiting, abdominal discomfort or fever/chills.

## 2014-11-06 ENCOUNTER — Telehealth: Payer: Self-pay | Admitting: *Deleted

## 2014-11-06 ENCOUNTER — Telehealth: Payer: Self-pay

## 2014-11-06 DIAGNOSIS — Z0000 Encounter for general adult medical examination without abnormal findings: Secondary | ICD-10-CM

## 2014-11-06 NOTE — Telephone Encounter (Signed)
Triage VM retrieved at 1550; pt states she had to go to ER at [facility] for vomiting at 4am today.  CT scan was performed and wanted Dr. [name] to review and call her. Message routed to MD and RN.

## 2014-11-06 NOTE — Telephone Encounter (Signed)
Scan from [facility] showed no evidence of cancer recurrence.

## 2014-11-06 NOTE — Telephone Encounter (Signed)
Pt requested an order for a mammogram.

## 2014-11-07 ENCOUNTER — Telehealth: Payer: Self-pay | Admitting: Internal Medicine

## 2014-11-07 NOTE — Telephone Encounter (Signed)
returned call and s.w. pt and r/s missed appt....pt ok and aware °

## 2014-11-10 NOTE — Telephone Encounter (Signed)
Mammogram scheduled for Sept 26th 1215.  Called pt and she informed me that she wanted to mammogram scheduled of next week.  Pt notified of upcoming mammogram.

## 2014-11-11 ENCOUNTER — Telehealth: Payer: Self-pay | Admitting: Internal Medicine

## 2014-11-11 NOTE — Telephone Encounter (Signed)
[facility] called to sched sooner appt...the patient is needs sooner appt...done they will give pt with appt

## 2014-11-12 ENCOUNTER — Telehealth: Payer: Self-pay | Admitting: Medical Oncology

## 2014-11-12 NOTE — Telephone Encounter (Signed)
Pt asking ( via Hassan Rowan)  if we received records form [facility] . I left message that we will call her if we don't have records. Records available in care everywhere.

## 2014-11-17 ENCOUNTER — Ambulatory Visit (HOSPITAL_COMMUNITY): Payer: Medicaid Other

## 2014-11-18 ENCOUNTER — Telehealth: Payer: Self-pay | Admitting: *Deleted

## 2014-11-18 NOTE — Telephone Encounter (Signed)
Pt called and stated that she is spotting on depo and thought that this wasn't supposed to happen. Requesting a nurse to call her back to discuss.

## 2014-11-24 ENCOUNTER — Ambulatory Visit (HOSPITAL_BASED_OUTPATIENT_CLINIC_OR_DEPARTMENT_OTHER): Payer: Medicaid Other | Admitting: Oncology

## 2014-11-24 ENCOUNTER — Encounter: Payer: Self-pay | Admitting: Oncology

## 2014-11-24 VITALS — BP 109/71 | HR 90 | Temp 98.3°F | Resp 17 | Ht 60.0 in | Wt 118.8 lb

## 2014-11-24 DIAGNOSIS — R112 Nausea with vomiting, unspecified: Secondary | ICD-10-CM

## 2014-11-24 DIAGNOSIS — M549 Dorsalgia, unspecified: Secondary | ICD-10-CM | POA: Diagnosis not present

## 2014-11-24 DIAGNOSIS — I2699 Other pulmonary embolism without acute cor pulmonale: Secondary | ICD-10-CM | POA: Diagnosis not present

## 2014-11-24 DIAGNOSIS — C169 Malignant neoplasm of stomach, unspecified: Secondary | ICD-10-CM | POA: Diagnosis not present

## 2014-11-24 MED ORDER — OXYCODONE-ACETAMINOPHEN 7.5-325 MG PO TABS: 1.0000 | ORAL_TABLET | ORAL | Status: DC | PRN

## 2014-11-24 MED ORDER — PROMETHAZINE HCL 25 MG PO TABS: 25.0000 mg | ORAL_TABLET | Freq: Four times a day (QID) | ORAL | Status: DC | PRN

## 2014-11-24 NOTE — Telephone Encounter (Signed)
Pt contacted the clinic, Pt states she is bleeding and requesting to come in early for her Depo Provera injection. Pt states she recently had blood clots in her lungs and pneumonia.  Appointment scheduled for 10/14. Pt states she needs the Depo Provera.  Appointment moved to Friday November 28, 2014 @ 1030. Pt verbalizes understanding.

## 2014-11-24 NOTE — Telephone Encounter (Signed)
Attempted to contact patient, no answer, left message for patient to return call to the clinic.

## 2014-11-24 NOTE — Progress Notes (Signed)
[place] [phone]   [phone]  OFFICE PROGRESS NOTE  Arnoldo Morale, MD [place] [address]  DIAGNOSIS: Stage IIA (T1a, N2, M0) gastric adenocarcinoma diagnosed in July of 2015  PRIOR THERAPY: 1) Status post diagnostic laparoscopy, distal gastrectomy with billroth II anastamosis and feeding gastrojejunostomy tube under the care of Dr. Barry Dienes on 09/20/2013. 2) Adjuvant systemic chemotherapy with 5-FU 600 mg/M2 with leucovorin on days 1, 8, 15, 22, 29 and 36 every 8 weeks. First dose 11/19/2013. 3) Adjuvant concurrent chemoradiation with Xeloda 650 MG/M2 by mouth twice a day for 5 days every week with radiation. The patient completed the course of radiotherapy but she declined to take Xeloda during her radiation.  CURRENT THERAPY: None.  INTERVAL HISTORY: Paula [name] 49 y.o. female returns to the clinic today for followup per her request. The patient has been on observation for the last few months. Was seen in the ER at [facility] on 11/06/14 for nausea and vomiting. Ct scan was performed at the visit which showed no evidence of obstruction or recurrent cancer. She was still not feeling well and was seen at the [facility] ER on 9/18. 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 She was started on Lovenox 50 mg BID.   Since her ER visits, she has been doing fine and recently with no specific complaints except for occasional abdominal pain at the gastric tube incision site as well as the surgical scar. She denied having any significant fever or chills, but has occasional nausea with vomiting. Uses Phenergan 2-3 times per day. She denied having any significant shortness of breath or hemoptysis. Eating better and slowly gaining back weight. Has persistent back pain and used Percocet 2-3 times  per day.   MEDICAL HISTORY: Past Medical History  Diagnosis Date  . Headache(784.0) 2011    Migraines  . Esophageal reflux     On omeprazole  . Costochondritis   . Hypertension     started around 2011  . Ovarian cyst   . Stomach cancer ([place])   . Family history of cancer   . Heart murmur   . On antineoplastic chemotherapy     5-FU and leucovorin    ALLERGIES:  has No Known Allergies.  MEDICATIONS:  Current Outpatient Prescriptions  Medication Sig Dispense Refill  . enoxaparin (LOVENOX) 60 MG/0.6ML injection Inject 50 mg into the skin.    . carvedilol (COREG) 6.25 MG tablet Take 6.25 mg by mouth 2 (two) times daily with a meal.    . Multiple Vitamins-Minerals (MULTIVITAMIN & MINERAL PO) Take 1 tablet by mouth daily.    Marland Kitchen oxyCODONE-acetaminophen (PERCOCET) 7.5-325 MG tablet Take 1 tablet by mouth every 4 (four) hours as needed for severe pain (for pain). 40 tablet 0  . promethazine (PHENERGAN) 25 MG suppository Place 1 suppository (25 mg total) rectally every 6 (six) hours as needed for nausea or vomiting. 12 suppository 0  . promethazine (PHENERGAN) 25 MG tablet Take 1 tablet (25 mg total) by mouth every 6 (six) hours as needed for nausea or vomiting. 30 tablet 0  . [DISCONTINUED] pantoprazole (PROTONIX) 40 MG tablet Take 1 tablet (40 mg total) by mouth 2 (two) times daily before a meal. (Patient not taking: Reported on 07/18/2014) 60 tablet 3  . [DISCONTINUED] temazepam (  RESTORIL) 30 MG capsule Take 1 capsule (30 mg total) by mouth at bedtime as needed for sleep. (Patient not taking: Reported on 07/18/2014) 30 capsule 0   Current Facility-Administered Medications  Medication Dose Route Frequency Provider Last Rate Last Dose  . medroxyPROGESTERone (DEPO-PROVERA) injection 150 mg  150 mg Intramuscular Q90 days Osborne Oman, MD   150 mg at 09/25/14 1602    SURGICAL HISTORY:  Past Surgical History  Procedure Laterality Date  . Oophorectomy      Around 1985 left ovary removal    . Tonsillectomy      Around 1994  . Tubal ligation    . Esophagogastroduodenoscopy N/A 09/16/2013    Procedure: ESOPHAGOGASTRODUODENOSCOPY (EGD);  Surgeon: Wonda Horner, MD;  Location: [facility];  Service: Endoscopy;  Laterality: N/A;  . Laparoscopy N/A 09/20/2013    Procedure: DIAGNOSTIC LAPAROSCOPY,DISTAL GASTRECTOMY AND FEEDING GASTROJEJUNOSTOMY;  Surgeon: [name], MD;  Location: [facility];  Service: General;  Laterality: N/A;  . Portacath placement Left 11/18/2013    Procedure: INSERTION PORT-A-CATH;  Surgeon: [name], MD;  Location: [facility];  Service: General;  Laterality: Left;    REVIEW OF SYSTEMS:  A comprehensive review of systems was negative except for: Constitutional: positive for fatigue Gastrointestinal: positive for nausea   PHYSICAL EXAMINATION: General appearance: alert, cooperative, fatigued and no distress Head: Normocephalic, without obvious abnormality, atraumatic Neck: no adenopathy, no JVD, supple, symmetrical, trachea midline and thyroid not enlarged, symmetric, no tenderness/mass/nodules Lymph nodes: Cervical, supraclavicular, and axillary nodes normal. Resp: clear to auscultation bilaterally Back: symmetric, no curvature. ROM normal. No CVA tenderness. Cardio: regular rate and rhythm, S1, S2 normal, no murmur, click, rub or gallop GI: soft, non-tender; bowel sounds normal; no masses,  no organomegaly Extremities: extremities normal, atraumatic, no cyanosis or edema Neurologic: Alert and oriented X 3, normal strength and tone. Normal symmetric reflexes. Normal coordination and gait  ECOG PERFORMANCE STATUS: 1 - Symptomatic but completely ambulatory  Blood pressure 109/71, pulse 90, temperature 98.3 F (36.8 C), temperature source Oral, resp. rate 17, height 5' (1.524 m), weight 118 lb 12.8 oz (53.887 kg), SpO2 100 %.  LABORATORY DATA: Lab Results  Component Value Date   WBC 5.5 09/16/2014   HGB 11.3* 09/16/2014   HCT 34.7* 09/16/2014   MCV 88.3  09/16/2014   PLT 254 09/16/2014      Chemistry      Component Value Date/Time   NA 135 09/16/2014 0351   NA 143 06/30/2014 0846   K 3.9 09/16/2014 0351   K 4.0 06/30/2014 0846   CL 101 09/16/2014 0351   CO2 26 09/16/2014 0351   CO2 29 06/30/2014 0846   BUN 7 09/16/2014 0351   BUN 6.2* 06/30/2014 0846   CREATININE 0.76 09/16/2014 0351   CREATININE 0.8 06/30/2014 0846      Component Value Date/Time   CALCIUM 8.9 09/16/2014 0351   CALCIUM 9.2 06/30/2014 0846   ALKPHOS 96 09/16/2014 0351   ALKPHOS 57 06/30/2014 0846   AST 27 09/16/2014 0351   AST 30 06/30/2014 0846   ALT 17 09/16/2014 0351   ALT 25 06/30/2014 0846   BILITOT 0.7 09/16/2014 0351   BILITOT 0.53 06/30/2014 0846       RADIOGRAPHIC STUDIES:  ASSESSMENT AND PLAN: this is a very pleasant 49 year old African American female recently diagnosed with a stage IIA gastric adenocarcinoma status post resection with lymph node dissection. She completed the first 2 cycles adjuvant chemotherapy with 5-FU and leucovorin and tolerated  her treatment fairly well except for neutropenia and mild nausea. This was followed by a course of adjuvant radiotherapy and the patient was unable to tolerate the concurrent chemotherapy with Xeloda which was discontinued. She is currently on observation.  The patient was seen and examined with Dr. [name]. Ct scan results from [facility] reviewed and do not show evidence of recurrent cancer. Recommend continued observation. Will cancel CT scan for October 2016 and plan to get another surveillance scan in about 6 months.   She has a new diagnosis of PE. Recommend that she continue Lovenox for 6 months.   For pain management, she will continue on her current pain medication and she was giving a refill of Percocet. Discussed that she should use Percocet only when absolutely needed.   For nausea the patient was given a refill of Phenergan.  The patient will follow-up in 6 months to review her CT  scan results.   She was advised to call immediately if she has any concerning symptoms in the interval.  The patient voices understanding of current disease status and treatment options and is in agreement with the current care plan.  All questions were answered. The patient knows to call the clinic with any problems, questions or concerns. We can certainly see the patient much sooner if necessary.  Mikey Bussing, DNP, AGPCNP-BC, AOCNP   ADDENDUM: Hematology/Oncology Attending: I had a face to face encounter with the patient. I recommended her care plan. This is a very pleasant 48 years old African-American female with a stage II gastric adenocarcinoma status post surgical resection followed by adjuvant systemic chemotherapy as well as adjuvant radiation. She is currently on observation and feeling fine. She had CT scan of the abdomen and pelvis performed at [facility] that showed no evidence for disease progression. The patient was recently found to have Port-A-Cath related the venous thrombosis and she is currently on Lovenox. I recommended for the patient to continue on observation for now. I would see her back for follow-up visit in 6 months with repeat CT scan of the chest, abdomen and pelvis for restaging of her disease. She will continue on Lovenox for the the venous thrombosis for at least 6 months. The patient was advised to call immediately if she has any concerning symptoms in the interval.  Disclaimer: This note was dictated with voice recognition software. Similar sounding words can inadvertently be transcribed and may be missed upon review. Eilleen Kempf., MD 11/24/2014

## 2014-11-26 ENCOUNTER — Telehealth: Payer: Self-pay | Admitting: Medical Oncology

## 2014-11-26 NOTE — Telephone Encounter (Signed)
I left message to call back. 

## 2014-11-28 ENCOUNTER — Ambulatory Visit (INDEPENDENT_AMBULATORY_CARE_PROVIDER_SITE_OTHER): Payer: Self-pay | Admitting: Medical

## 2014-12-05 ENCOUNTER — Ambulatory Visit (INDEPENDENT_AMBULATORY_CARE_PROVIDER_SITE_OTHER): Payer: Medicaid Other | Admitting: *Deleted

## 2014-12-05 ENCOUNTER — Ambulatory Visit: Payer: Self-pay | Admitting: Medical

## 2014-12-05 ENCOUNTER — Other Ambulatory Visit: Payer: Self-pay

## 2014-12-05 ENCOUNTER — Telehealth: Payer: Self-pay | Admitting: *Deleted

## 2014-12-05 ENCOUNTER — Other Ambulatory Visit: Payer: Self-pay | Admitting: Medical Oncology

## 2014-12-05 DIAGNOSIS — C169 Malignant neoplasm of stomach, unspecified: Secondary | ICD-10-CM

## 2014-12-05 DIAGNOSIS — Z3042 Encounter for surveillance of injectable contraceptive: Secondary | ICD-10-CM

## 2014-12-05 DIAGNOSIS — N924 Excessive bleeding in the premenopausal period: Secondary | ICD-10-CM | POA: Diagnosis not present

## 2014-12-05 DIAGNOSIS — R112 Nausea with vomiting, unspecified: Secondary | ICD-10-CM

## 2014-12-05 MED ORDER — PROMETHAZINE HCL 25 MG PO TABS: 25.0000 mg | ORAL_TABLET | Freq: Four times a day (QID) | ORAL | Status: DC | PRN

## 2014-12-05 MED ORDER — PROMETHAZINE HCL 25 MG RE SUPP: 25.0000 mg | Freq: Four times a day (QID) | RECTAL | Status: DC | PRN

## 2014-12-05 NOTE — Telephone Encounter (Signed)
Patient called requesting refill for Phenergan.  Return number when ready for pick up is [phone].

## 2014-12-08 ENCOUNTER — Other Ambulatory Visit: Payer: Self-pay | Admitting: Medical Oncology

## 2014-12-08 ENCOUNTER — Telehealth: Payer: Self-pay | Admitting: Medical Oncology

## 2014-12-08 DIAGNOSIS — I2699 Other pulmonary embolism without acute cor pulmonale: Secondary | ICD-10-CM

## 2014-12-08 MED ORDER — ENOXAPARIN SODIUM 60 MG/0.6ML ~~LOC~~ SOLN: 50.0000 mg | SUBCUTANEOUS | Status: DC

## 2014-12-08 NOTE — Telephone Encounter (Signed)
Per pharmacy decision should be made today or tomorrow regarding pt next refill of lovenox.

## 2014-12-08 NOTE — Telephone Encounter (Signed)
I elft message for pt to return my call with reason for lovenox and who prescribed it first .

## 2014-12-09 ENCOUNTER — Emergency Department (HOSPITAL_BASED_OUTPATIENT_CLINIC_OR_DEPARTMENT_OTHER)
Admission: EM | Admit: 2014-12-09 | Discharge: 2014-12-09 | Disposition: A | Discharge status: Other Acute Inpatient Hospital | Payer: Medicaid Other | Attending: Emergency Medicine | Admitting: Emergency Medicine

## 2014-12-09 ENCOUNTER — Encounter (HOSPITAL_BASED_OUTPATIENT_CLINIC_OR_DEPARTMENT_OTHER): Payer: Self-pay

## 2014-12-09 ENCOUNTER — Emergency Department (HOSPITAL_BASED_OUTPATIENT_CLINIC_OR_DEPARTMENT_OTHER): Payer: Medicaid Other

## 2014-12-09 DIAGNOSIS — R011 Cardiac murmur, unspecified: Secondary | ICD-10-CM | POA: Diagnosis not present

## 2014-12-09 DIAGNOSIS — I10 Essential (primary) hypertension: Secondary | ICD-10-CM | POA: Diagnosis not present

## 2014-12-09 DIAGNOSIS — R079 Chest pain, unspecified: Secondary | ICD-10-CM | POA: Diagnosis not present

## 2014-12-09 DIAGNOSIS — R000 Tachycardia, unspecified: Secondary | ICD-10-CM | POA: Insufficient documentation

## 2014-12-09 DIAGNOSIS — E860 Dehydration: Secondary | ICD-10-CM | POA: Diagnosis not present

## 2014-12-09 DIAGNOSIS — Z3202 Encounter for pregnancy test, result negative: Secondary | ICD-10-CM | POA: Insufficient documentation

## 2014-12-09 DIAGNOSIS — Z8742 Personal history of other diseases of the female genital tract: Secondary | ICD-10-CM | POA: Diagnosis not present

## 2014-12-09 DIAGNOSIS — Z79899 Other long term (current) drug therapy: Secondary | ICD-10-CM | POA: Diagnosis not present

## 2014-12-09 DIAGNOSIS — Q279 Congenital malformation of peripheral vascular system, unspecified: Secondary | ICD-10-CM

## 2014-12-09 DIAGNOSIS — K219 Gastro-esophageal reflux disease without esophagitis: Secondary | ICD-10-CM | POA: Diagnosis not present

## 2014-12-09 DIAGNOSIS — R935 Abnormal findings on diagnostic imaging of other abdominal regions, including retroperitoneum: Secondary | ICD-10-CM | POA: Diagnosis not present

## 2014-12-09 DIAGNOSIS — H02879 Vascular anomalies of unspecified eye, unspecified eyelid: Secondary | ICD-10-CM | POA: Diagnosis not present

## 2014-12-09 DIAGNOSIS — Z85028 Personal history of other malignant neoplasm of stomach: Secondary | ICD-10-CM | POA: Diagnosis not present

## 2014-12-09 DIAGNOSIS — R1110 Vomiting, unspecified: Secondary | ICD-10-CM

## 2014-12-09 DIAGNOSIS — K529 Noninfective gastroenteritis and colitis, unspecified: Secondary | ICD-10-CM | POA: Insufficient documentation

## 2014-12-09 DIAGNOSIS — Z7901 Long term (current) use of anticoagulants: Secondary | ICD-10-CM | POA: Diagnosis not present

## 2014-12-09 DIAGNOSIS — Z86711 Personal history of pulmonary embolism: Secondary | ICD-10-CM | POA: Insufficient documentation

## 2014-12-09 DIAGNOSIS — Z8701 Personal history of pneumonia (recurrent): Secondary | ICD-10-CM | POA: Insufficient documentation

## 2014-12-09 DIAGNOSIS — Z8739 Personal history of other diseases of the musculoskeletal system and connective tissue: Secondary | ICD-10-CM | POA: Diagnosis not present

## 2014-12-09 DIAGNOSIS — Z87891 Personal history of nicotine dependence: Secondary | ICD-10-CM | POA: Insufficient documentation

## 2014-12-09 DIAGNOSIS — R112 Nausea with vomiting, unspecified: Secondary | ICD-10-CM | POA: Diagnosis present

## 2014-12-09 HISTORY — DX: Pneumonia, unspecified organism: J18.9

## 2014-12-09 HISTORY — DX: Other pulmonary embolism without acute cor pulmonale: I26.99

## 2014-12-09 LAB — URINE MICROSCOPIC-ADD ON

## 2014-12-09 LAB — LIPASE, BLOOD: Lipase: 15 U/L — ABNORMAL LOW (ref 22–51)

## 2014-12-09 LAB — CBC
HCT: 43.1 % (ref 36.0–46.0)
Hemoglobin: 14.4 g/dL (ref 12.0–15.0)
MCH: 28.2 pg (ref 26.0–34.0)
MCHC: 33.4 g/dL (ref 30.0–36.0)
MCV: 84.5 fL (ref 78.0–100.0)
PLATELETS: 411 10*3/uL — AB (ref 150–400)
RBC: 5.1 MIL/uL (ref 3.87–5.11)
RDW: 14.6 % (ref 11.5–15.5)
WBC: 6.5 10*3/uL (ref 4.0–10.5)

## 2014-12-09 LAB — COMPREHENSIVE METABOLIC PANEL
ALBUMIN: 5.2 g/dL — AB (ref 3.5–5.0)
ALT: 24 U/L (ref 14–54)
AST: 25 U/L (ref 15–41)
Alkaline Phosphatase: 119 U/L (ref 38–126)
Anion gap: 11 (ref 5–15)
BUN: 19 mg/dL (ref 6–20)
CO2: 25 mmol/L (ref 22–32)
Calcium: 10.1 mg/dL (ref 8.9–10.3)
Chloride: 96 mmol/L — ABNORMAL LOW (ref 101–111)
Creatinine, Ser: 0.89 mg/dL (ref 0.44–1.00)
GFR calc Af Amer: >60 mL/min (ref >60)
GFR calc non Af Amer: >60 mL/min (ref >60)
GLUCOSE: 117 mg/dL — AB (ref 65–99)
Potassium: 3.9 mmol/L (ref 3.5–5.1)
Sodium: 132 mmol/L — ABNORMAL LOW (ref 135–145)
Total Bilirubin: 1.2 mg/dL (ref 0.3–1.2)
Total Protein: 9.7 g/dL — ABNORMAL HIGH (ref 6.5–8.1)

## 2014-12-09 LAB — URINALYSIS, ROUTINE W REFLEX MICROSCOPIC
Glucose, UA: NEGATIVE mg/dL
Ketones, ur: >80 mg/dL — AB
Leukocytes, UA: NEGATIVE
NITRITE: NEGATIVE
Protein, ur: 100 mg/dL — AB
SPECIFIC GRAVITY, URINE: 1.026 (ref 1.005–1.030)
Urobilinogen, UA: 0.2 mg/dL (ref 0.0–1.0)
pH: 5.5 (ref 5.0–8.0)

## 2014-12-09 LAB — PROTIME-INR
INR: 0.96 (ref 0.00–1.49)
PROTHROMBIN TIME: 13 s (ref 11.6–15.2)

## 2014-12-09 LAB — PREGNANCY, URINE: PREG TEST UR: NEGATIVE

## 2014-12-09 LAB — TROPONIN I

## 2014-12-09 MED ORDER — PROMETHAZINE HCL 25 MG/ML IJ SOLN
25.0000 mg | Freq: Once | INTRAMUSCULAR | Status: AC
  Administered 2014-12-09: 25 mg via INTRAVENOUS
  Filled 2014-12-09: qty 1

## 2014-12-09 MED ORDER — SODIUM CHLORIDE 0.9 % IV BOLUS (SEPSIS)
1000.0000 mL | Freq: Once | INTRAVENOUS | Status: AC
  Administered 2014-12-09: 1000 mL via INTRAVENOUS

## 2014-12-09 MED ORDER — HYDROMORPHONE HCL 1 MG/ML IJ SOLN
1.0000 mg | Freq: Once | INTRAMUSCULAR | Status: AC
  Administered 2014-12-09: 1 mg via INTRAVENOUS
  Filled 2014-12-09: qty 1

## 2014-12-09 MED ORDER — MORPHINE SULFATE (PF) 4 MG/ML IV SOLN
4.0000 mg | Freq: Once | INTRAVENOUS | Status: AC
Start: 2014-12-09 — End: 2014-12-09
  Administered 2014-12-09: 4 mg via INTRAVENOUS
  Filled 2014-12-09: qty 1

## 2014-12-09 MED ORDER — IOHEXOL 300 MG/ML  SOLN
100.0000 mL | Freq: Once | INTRAMUSCULAR | Status: AC | PRN
  Administered 2014-12-09: 100 mL via INTRAVENOUS

## 2014-12-09 MED ORDER — IOHEXOL 300 MG/ML  SOLN
25.0000 mL | Freq: Once | INTRAMUSCULAR | Status: AC | PRN
  Administered 2014-12-09: 25 mL via ORAL

## 2014-12-09 MED ORDER — METOPROLOL TARTRATE 50 MG PO TABS
50.0000 mg | ORAL_TABLET | Freq: Once | ORAL | Status: AC
Start: 2014-12-09 — End: 2014-12-09
  Administered 2014-12-09: 50 mg via ORAL
  Filled 2014-12-09: qty 1

## 2014-12-09 NOTE — ED Notes (Signed)
C/o vomiting since friday

## 2014-12-09 NOTE — ED Notes (Signed)
Pt request nausea med. Asked pt if zofran was  Okay with her. Pt states she prefers phenergan. MD aware.

## 2014-12-09 NOTE — ED Notes (Signed)
Report given to EMS personnel.

## 2014-12-09 NOTE — ED Notes (Signed)
Paula [name]

## 2014-12-09 NOTE — ED Notes (Signed)
Patient is asking for something for nausea.

## 2014-12-09 NOTE — ED Notes (Signed)
PT called out again for nausea med. Pt instructed that MD would need to see her first.

## 2014-12-09 NOTE — ED Provider Notes (Signed)
CSN: [id number]     Arrival date & time 12/09/14  1120 History   First MD Initiated Contact with Patient 12/09/14 1210     Chief Complaint  Patient presents with  . Emesis     (Consider location/radiation/quality/duration/timing/severity/associated sxs/prior Treatment) HPI Comments: 49 year old female with a past medical history of stomach cancer, pulmonary embolism, pneumonia, GERD, costochondritis, hypertension and ovarian cyst presenting with 4 days of nausea and vomiting. Initially, she had one daily episode for the first 2 days becoming more constant yesterday and today. Reports multiple episodes of nonbloody, nonbilious emesis. Admits to associated generalized abdominal pain that began after "vomiting so much". Admits to associated fever, MAXIMUM TEMPERATURE 103 two days ago which has not returned. Also after vomiting developed midsternal chest pain radiating into her back. About one month ago she was seen at [facility] and diagnosed with a pulmonary embolism and is currently taking Lovenox. Reports compliance with Lovenox. No shortness of breath. On chart review, she has a scheduled CT of her abdomen ordered by her oncologist for 6 days from now.  Patient is a 49 y.o. female presenting with vomiting. The history is provided by the patient.  Emesis Severity:  Unable to specify Duration:  4 days Number of daily episodes:  "multiple" Quality:  Stomach contents and undigested food Progression:  Worsening Chronicity:  Recurrent Recent urination:  Normal Relieved by:  Nothing Worsened by:  Nothing tried Ineffective treatments:  None tried Associated symptoms: abdominal pain and chills     Past Medical History  Diagnosis Date  . Headache(784.0) 2011    Migraines  . Esophageal reflux     On omeprazole  . Costochondritis   . Hypertension     started around 2011  . Ovarian cyst   . Stomach cancer ([place])   . Family history of cancer   . Heart murmur   . On antineoplastic  chemotherapy     5-FU and leucovorin  . Pneumonia   . Pulmonary emboli [facility])    Past Surgical History  Procedure Laterality Date  . Oophorectomy      Around 1985 left ovary removal  . Tonsillectomy      Around 1994  . Tubal ligation    . Esophagogastroduodenoscopy N/A 09/16/2013    Procedure: ESOPHAGOGASTRODUODENOSCOPY (EGD);  Surgeon: Wonda Horner, MD;  Location: [facility];  Service: Endoscopy;  Laterality: N/A;  . Laparoscopy N/A 09/20/2013    Procedure: DIAGNOSTIC LAPAROSCOPY,DISTAL GASTRECTOMY AND FEEDING GASTROJEJUNOSTOMY;  Surgeon: [name], MD;  Location: [facility];  Service: General;  Laterality: N/A;  . Portacath placement Left 11/18/2013    Procedure: INSERTION PORT-A-CATH;  Surgeon: [name], MD;  Location: [facility];  Service: General;  Laterality: Left;   Family History  Problem Relation Age of Onset  . Hypertension Mother   . Diabetes Mellitus II Mother   . Hypertension Sister   . Hypertension Brother   . Heart failure Maternal Grandmother   . Hypertension Maternal Grandfather   . Prostate cancer Paternal Uncle     living at 69  . Gastric cancer Cousin 25    pat first cousin; daughter of pat uncle living with prostate ca   . Gastric cancer Cousin     mat first cousin; daughter of mat aunt who died in 72s due to heart issues  . Prostate cancer Paternal Uncle     deceased  . Prostate cancer Paternal Uncle     deceased  . Lung cancer Paternal Uncle  smoker; deceased   Social History  Substance Use Topics  . Smoking status: Former Research scientist (life sciences)  . Smokeless tobacco: Never Used     Comment: Stopped 2004  . Alcohol Use: No     Comment: wine at dinner occasional   OB History    No data available     Review of Systems  Constitutional: Positive for fever and chills.  Cardiovascular: Positive for chest pain.  Gastrointestinal: Positive for nausea, vomiting and abdominal pain.  Musculoskeletal: Positive for back pain.  All other systems reviewed and are  negative.     Allergies  Review of patient's allergies indicates no known allergies.  Home Medications   Prior to Admission medications   Medication Sig Start Date End Date Taking? Authorizing Provider  carvedilol (COREG) 6.25 MG tablet Take 6.25 mg by mouth 2 (two) times daily with a meal.    Historical Provider, MD  enoxaparin (LOVENOX) 60 MG/0.6ML injection Inject 0.5 mLs (50 mg total) into the skin daily. 12/08/14   Curt Bears, MD  Multiple Vitamins-Minerals (MULTIVITAMIN & MINERAL PO) Take 1 tablet by mouth daily.    Historical Provider, MD  promethazine (PHENERGAN) 25 MG tablet Take 1 tablet (25 mg total) by mouth every 6 (six) hours as needed for nausea or vomiting. 12/05/14   Curt Bears, MD   BP 177/97 mmHg  Pulse 85  Temp(Src) 98.8 F (37.1 C) (Oral)  Resp 16  Ht 5' (1.524 m)  Wt 115 lb (52.164 kg)  BMI 22.46 kg/m2  SpO2 100% Physical Exam  Constitutional: She is oriented to person, place, and time. No distress.  Chronically ill appearing, no acute distress.  HENT:  Head: Normocephalic and atraumatic.  Mouth/Throat: Oropharynx is clear and moist.  Eyes: Conjunctivae and EOM are normal.  Neck: Normal range of motion. Neck supple.  Cardiovascular: Regular rhythm and normal heart sounds.  Tachycardia present.   Pulmonary/Chest: Effort normal and breath sounds normal. No respiratory distress.  Abdominal: Normal appearance and bowel sounds are normal. She exhibits no distension. There is no rigidity, no rebound and no guarding.  Mild generalized tenderness. No peritoneal signs.  Musculoskeletal: Normal range of motion. She exhibits no edema.  Neurological: She is alert and oriented to person, place, and time. No sensory deficit.  Skin: Skin is warm and dry.  Psychiatric: She has a normal mood and affect. Her behavior is normal.  Nursing note and vitals reviewed.   ED Course  Procedures (including critical care time) Labs Review Labs Reviewed  CBC -  Abnormal; Notable for the following:    Platelets 411 (*)    All other components within normal limits  COMPREHENSIVE METABOLIC PANEL - Abnormal; Notable for the following:    Sodium 132 (*)    Chloride 96 (*)    Glucose, Bld 117 (*)    Total Protein 9.7 (*)    Albumin 5.2 (*)    All other components within normal limits  LIPASE, BLOOD - Abnormal; Notable for the following:    Lipase 15 (*)    All other components within normal limits  URINALYSIS, ROUTINE W REFLEX MICROSCOPIC (NOT AT [facility]) - Abnormal; Notable for the following:    Color, Urine AMBER (*)    APPearance CLOUDY (*)    Hgb urine dipstick SMALL (*)    Bilirubin Urine MODERATE (*)    Ketones, ur >80 (*)    Protein, ur 100 (*)    All other components within normal limits  URINE MICROSCOPIC-ADD ON - Abnormal;  Notable for the following:    Squamous Epithelial / LPF FEW (*)    Bacteria, UA FEW (*)    All other components within normal limits  TROPONIN I  PROTIME-INR  PREGNANCY, URINE    Imaging Review Dg Chest 2 View  12/09/2014  CLINICAL DATA:  Chest pain EXAM: CHEST  2 VIEW COMPARISON:  07/01/2014 FINDINGS: Cardiomediastinal silhouette is stable. Streaky atelectasis or scarring right base laterally again noted. No acute infiltrate or pleural effusion. No pulmonary edema. Stable left subclavian Port-A-Cath position. IMPRESSION: No active cardiopulmonary disease. Electronically Signed   By: Lahoma Crocker M.D.   On: 12/09/2014 15:03   Ct Abdomen Pelvis W Contrast  12/09/2014  CLINICAL DATA:  Abdominal pain. Nausea and vomiting. Oophorectomy. History of gastric cancer. EXAM: CT ABDOMEN AND PELVIS WITH CONTRAST TECHNIQUE: Multidetector CT imaging of the abdomen and pelvis was performed using the standard protocol following bolus administration of intravenous contrast. CONTRAST:  85mL OMNIPAQUE IOHEXOL 300 MG/ML SOLN, 18mL OMNIPAQUE IOHEXOL 300 MG/ML SOLN COMPARISON:  Multiple exams, including 06/20/2014 and 09/15/2014 FINDINGS:  Lower chest: Vascular ectasia anterolaterally in the right lower lobe with adjacent bulla, similar to prior exam, with an unusual branch of the celiac trunk extending along the posterior dome of the liver along the hemidiaphragm (for example, image 12 series 2). 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 There is also a somewhat unusual anterior venous structure running from the super hepatic intra-abdominal IVC along the anterior liver and hemidiaphragm. Hepatobiliary: Diffuse hepatic steatosis. Pancreas: Unremarkable Spleen: Unremarkable Adrenals/Urinary Tract: Unremarkable Stomach/Bowel: Partial gastrectomy with gastrojejunostomy. Likely mucosal thickening and abnormal mucosal enhancement in the residual stomach. However, I do not see a residual focal mass. Wall thickening in the distal half of the transverse colon, splenic flexure, and descending colon. The trace adjacent edema, for example along the splenic flexure margin on images 23-29 of series 2, makes me favor colitis over nondistention as a cause. Vascular/Lymphatic: Anomalous vessels along the liver as noted in the lower chest section above. No adenopathy. Reproductive: Stable appearance of uterine fibroids most prominently posteriorly on the left. Other: Small indistinct subcutaneous nodules along the lower abdomen, most typical for injection sites or small foci of inflammation. Correlate with any history of injection. Musculoskeletal: Unremarkable IMPRESSION: 1. No findings of metastatic disease currently. Partial gastrectomy and gastrojejunostomy with wall thickening in the residual stomach suggesting gastritis. 2. Abnormal wall thickening in the transverse and descending colon with a small amount of adjacent stranding, compatible with colitis. Favor nonvascular cause such as infectious colitis based on the relative lack of atherosclerosis. 3. 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 4. Left posterior uterine fibroid. 5. Diffuse hepatic steatosis. 6. Small indistinct subcutaneous foci of increased density, probably from small injection sites or small foci of inflammation. Electronically Signed   By: Van Clines M.D.   On: 12/09/2014 15:10   I have personally reviewed and evaluated these images and lab results as part of my medical decision-making.   EKG Interpretation   Date/Time:  Tuesday December 09 2014 12:52:09 EDT Ventricular Rate:  118 PR Interval:  140 QRS Duration: 66 QT Interval:  312 QTC Calculation: 437 R Axis:   56 Text Interpretation:  Sinus tachycardia Biatrial enlargement Left  ventricular hypertrophy No significant change since last tracing Confirmed  by Maryan Rued  MD, Loree Fee ([id number]) on 12/09/2014 1:33:35 PM      MDM   Final diagnoses:  Intractable vomiting with nausea, vomiting of unspecified type  Dehydration  Colitis  Vascular anomaly  Abnormal CT of the abdomen   Patient with history of stomach cancer presenting with nausea, vomiting, abdominal pain and chest pain. Nontoxic/nonseptic appearing and in no acute distress. Tachycardic. Has generalized tenderness without peritoneal signs. Has a CT ordered by her oncologist for 6 days from now. Will obtain CT today. Will give pain and nausea control.  Labs, CT results as shown above. Will admit. Concern of this vascular anomaly seen as the pt is now on lovenox, increased risk of bleeding. Pt requesting [facility], will transfer. I spoke with Dr. Grandville Silos at [facility]  who accepts the pt in transfer. Pt given metoprolol as she was due for her night time dose of Coreg which is unavailable here. Stable for transfer.  At time of CT result, Dr. Audie Pinto is current attending, updated on result, and agrees with transfer.  Carman Ching, PA-C 12/09/14 2156  Blanchie Dessert, MD 12/10/14 [phone]

## 2014-12-09 NOTE — ED Notes (Signed)
Report given to Anderson Malta, Therapist, sports at [facility].

## 2014-12-09 NOTE — ED Notes (Addendum)
I took BP a second time with smaller cuff, got result of 191/123, patient states she has been ill for two days. I notified nurses.

## 2014-12-09 NOTE — ED Notes (Signed)
Pt requested nausea med.

## 2014-12-10 ENCOUNTER — Other Ambulatory Visit: Payer: Self-pay | Admitting: Medical Oncology

## 2014-12-11 ENCOUNTER — Other Ambulatory Visit: Payer: Self-pay | Admitting: Medical Oncology

## 2014-12-11 ENCOUNTER — Ambulatory Visit: Payer: Self-pay

## 2014-12-11 ENCOUNTER — Telehealth: Payer: Self-pay | Admitting: Medical Oncology

## 2014-12-11 NOTE — Progress Notes (Signed)
I called radiology and cancelled CT scan on monday

## 2014-12-11 NOTE — Telephone Encounter (Signed)
She is now on anxiety pills and pain med.  She confirmed she has appt with [name] in March.

## 2014-12-15 ENCOUNTER — Other Ambulatory Visit: Payer: Self-pay

## 2014-12-15 ENCOUNTER — Ambulatory Visit (HOSPITAL_COMMUNITY): Payer: Medicaid Other

## 2014-12-15 NOTE — Telephone Encounter (Signed)
Request submitted as requested on 12-05-2014.

## 2014-12-17 ENCOUNTER — Telehealth: Payer: Self-pay | Admitting: *Deleted

## 2014-12-17 DIAGNOSIS — C169 Malignant neoplasm of stomach, unspecified: Secondary | ICD-10-CM

## 2014-12-17 DIAGNOSIS — R112 Nausea with vomiting, unspecified: Secondary | ICD-10-CM

## 2014-12-17 NOTE — Telephone Encounter (Signed)
Pt called requesting a refill of phenergan to be send to [facility] on [address].  Please call when we have sent RX.

## 2014-12-17 NOTE — Telephone Encounter (Signed)
OK to refill but she is getting medication from all oncologists in [place]. [facility], [facility] and [facility].

## 2014-12-18 MED ORDER — PROMETHAZINE HCL 25 MG PO TABS: 25.0000 mg | ORAL_TABLET | Freq: Four times a day (QID) | ORAL | Status: DC | PRN

## 2014-12-18 NOTE — Telephone Encounter (Signed)
Called patient to notify her the refill order was sent.  Listened to her verbalize how sick she feels and continues with nausea, back pain, toes are still black and more.  Doesn't understand why still experiencing symptoms.

## 2014-12-18 NOTE — Telephone Encounter (Signed)
I called yesterday, spoke with Tammi about Phenergan refill from Dr. [name].  Call transferred to collaborative.  Provider note approves.

## 2014-12-18 NOTE — Addendum Note (Signed)
Addended by: Cherylynn Ridges on: 12/18/2014 10:12 AM   Modules accepted: Orders

## 2014-12-19 ENCOUNTER — Ambulatory Visit
Admission: RE | Admit: 2014-12-19 | Discharge: 2014-12-19 | Disposition: A | Discharge status: Home / Self Care | Payer: Medicaid Other | Source: Ambulatory Visit | Attending: Radiation Oncology | Admitting: Radiation Oncology

## 2014-12-19 ENCOUNTER — Encounter: Payer: Self-pay | Admitting: Radiation Oncology

## 2014-12-19 VITALS — BP 96/61 | HR 78 | Temp 98.1°F | Ht 60.0 in | Wt 122.6 lb

## 2014-12-19 DIAGNOSIS — C163 Malignant neoplasm of pyloric antrum: Secondary | ICD-10-CM

## 2014-12-19 NOTE — Progress Notes (Signed)
[name] is here for follow-up of radiation to her gastric antrum and body completed 06/17/2014. She reports she is eating well but does report nausea in the morning. She states she tries not to let her stomach get too full, because she will start throwing up if it does. She reports she was recently at [facility] for nausea problems. She reports some fatigue, and states she has some good days and bad days with her energy level. She complains of a "sore" stomach to touch and relates some of this to the lovenox shots she is on.   BP 96/61 mmHg  Pulse 78  Temp(Src) 98.1 F (36.7 C)  Ht 5' (1.524 m)  Wt 122 lb 9.6 oz (55.611 kg)  BMI 23.94 kg/m2   Wt Readings from Last 3 Encounters:  12/19/14 122 lb 9.6 oz (55.611 kg)  12/09/14 115 lb (52.164 kg)  11/24/14 118 lb 12.8 oz (53.887 kg)

## 2014-12-19 NOTE — Progress Notes (Addendum)
Radiation Oncology         [phone]) [phone] ________________________________  Name: Paula [name] MRN: [id number]  Date: 12/19/2014  DOB: 1965-11-27  Follow-Up Visit Note  Outpatient  CC: Arnoldo Morale, MD  Curt Bears, MD  Diagnosis and Prior Radiotherapy:    ICD-9-CM ICD-10-CM   1. 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 She reports she is eating well, but does report nausea in the morning. She states she tries not to let her stomach get too full because she will start throwing up if it does. She reports she was recently at [facility] for nausea problems. She reports some fatigue, and states she has some good days and bad days with her energy level. She complains of a "sore" stomach to touch and relates some of this to the lovenox shots she is on.   She presented to the ED with emesis about 10 days ago. CT of the abd/pelvis did not show any evidence of cancer or metastatic disease, but she did have evidence of gastritis, colitis (after colitis favored to be infection), and a vascular anomaly . She also had a chest X-ray that showed no evidence of cancer.  CT of the chest on 12/10/14 at [facility] showed, "A small amount of chronic right lower lobe segmental pulmonary embolism has decreased. There is similar chronic scarring and cystic bronchiectasis in the right lower lobe." I do not have those images  Please see [place] note in Care Everywhere. Pt was discharged by Dr Kendall Flack with  antiemetics, analgesics, Protonix, Reglan and Ativan. Reassurance given.   ALLERGIES:  has No Known Allergies.  Meds: Current Outpatient  Prescriptions  Medication Sig Dispense Refill  . carvedilol (COREG) 6.25 MG tablet Take 6.25 mg by mouth 2 (two) times daily with a meal.    . enoxaparin (LOVENOX) 60 MG/0.6ML injection Inject 0.5 mLs (50 mg total) into the skin daily. 30 Syringe 1  . Multiple Vitamins-Minerals (MULTIVITAMIN & MINERAL PO) Take 1 tablet by mouth daily.    . promethazine (PHENERGAN) 25 MG tablet Take 1 tablet (25 mg total) by mouth every 6 (six) hours as needed for nausea or vomiting. 30 tablet 0  . [DISCONTINUED] pantoprazole (PROTONIX) 40 MG tablet Take 1 tablet (40 mg total) by mouth 2 (two) times daily before a meal. (Patient not taking: Reported on 07/18/2014) 60 tablet 3  . [DISCONTINUED] temazepam (RESTORIL) 30 MG capsule Take 1 capsule (30 mg total) by mouth at bedtime as needed for sleep. (Patient not taking: Reported on 07/18/2014) 30 capsule 0   Current Facility-Administered Medications  Medication Dose Route Frequency Provider Last Rate Last Dose  . medroxyPROGESTERone (DEPO-PROVERA) injection 150 mg  150 mg Intramuscular Q90 days Osborne Oman, MD   150 mg at 12/05/14 1134    Physical Findings: The patient is in no acute distress. Patient is alert and oriented.  height is 5' (1.524 m) and weight is 122 lb 9.6 oz (55.611 kg). Her temperature is 98.1 F (36.7 C). Her blood pressure is 96/61 and her pulse is 78.  Lungs are clear to auscultation bilaterally. Heart has regular rate and rhythm. No palpable cervical or supraclavicularadenopathy.  Some small speckles on the anterior oral tongue, not of clinical concern. She is tender and slightly ridgged in the left upper quadrant of the abdomen. No palpable masses of the abdomen.  Lab Findings: Lab Results  Component Value Date   WBC 6.5 12/09/2014   HGB 14.4 12/09/2014   HCT 43.1 12/09/2014   MCV 84.5 12/09/2014   PLT 411* 12/09/2014    Radiographic Findings: Dg Chest 2 View  12/09/2014  CLINICAL DATA:  Chest pain EXAM: CHEST  2 VIEW COMPARISON:   07/01/2014 FINDINGS: Cardiomediastinal silhouette is stable. Streaky atelectasis or scarring right base laterally again noted. No acute infiltrate or pleural effusion. No pulmonary edema. Stable left subclavian Port-A-Cath position. IMPRESSION: No active cardiopulmonary disease. Electronically Signed   By: Lahoma Crocker M.D.   On: 12/09/2014 15:03   Ct Abdomen Pelvis W Contrast  12/09/2014  CLINICAL DATA:  Abdominal pain. Nausea and vomiting. Oophorectomy. History of gastric cancer. EXAM: CT ABDOMEN AND PELVIS WITH CONTRAST TECHNIQUE: Multidetector CT imaging of the abdomen and pelvis was performed using the standard protocol following bolus administration of intravenous contrast. CONTRAST:  66mL OMNIPAQUE IOHEXOL 300 MG/ML SOLN, 179mL OMNIPAQUE IOHEXOL 300 MG/ML SOLN COMPARISON:  Multiple exams, including 06/20/2014 and 09/15/2014 FINDINGS: Lower chest: Vascular ectasia anterolaterally in the right lower lobe with adjacent bulla, similar to prior exam, with an unusual branch of the celiac trunk extending along the posterior dome of the liver along the hemidiaphragm (for example, image 12 series 2). 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 There is also a somewhat unusual anterior venous structure running from the super hepatic intra-abdominal IVC along the anterior liver and hemidiaphragm. Hepatobiliary: Diffuse hepatic steatosis. Pancreas: Unremarkable Spleen: Unremarkable Adrenals/Urinary Tract: Unremarkable Stomach/Bowel: Partial gastrectomy with gastrojejunostomy. Likely mucosal thickening and abnormal mucosal enhancement in the residual stomach. However, I do not see a residual focal mass. Wall thickening in the distal half of the transverse colon, splenic flexure, and descending colon. The trace adjacent edema, for example along the splenic flexure margin on images 23-29 of series 2, makes me favor colitis over nondistention as a cause. Vascular/Lymphatic:  Anomalous vessels along the liver as noted in the lower chest section above. No adenopathy. Reproductive: Stable appearance of uterine fibroids most prominently posteriorly on the left. Other: Small indistinct subcutaneous nodules along the lower abdomen, most typical for injection sites or small foci of inflammation. Correlate with any history of injection. Musculoskeletal: Unremarkable IMPRESSION: 1. No findings of metastatic disease currently. Partial gastrectomy and gastrojejunostomy with wall thickening in the residual stomach suggesting gastritis. 2. Abnormal wall thickening in the transverse and descending colon with a small amount of adjacent stranding, compatible with colitis. Favor nonvascular cause such as infectious colitis based on the relative lack of atherosclerosis. 3. 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 4. Left posterior uterine fibroid. 5. Diffuse hepatic steatosis. 6. Small indistinct subcutaneous foci of increased density, probably from small injection sites or small foci of inflammation. Electronically Signed   By: Van Clines M.D.   On: 12/09/2014 15:10    Impression/Plan:   NED   I will  refer the patient back to Dr. Penelope Coop of gastroenterology due to colitis, gastritis, and recurrent episodes of emesis which prompted a recent ED visit.  I will ask my partner to present the patient's file at GI tumor board concerning her CT abd/pelvic to verify any further recommendations will be made regarding the pt's management. There is a  vascular malformation which is beyond my expertise.    Follow in 8 months, seeing med/onc in between now and then.  This document serves as a record of services personally performed by Eppie Gibson, MD. It was created on her behalf by Darcus Austin, a trained medical scribe. The creation of this record is based on the scribe's personal observations and the provider's statements to them. This document has been checked and approved by the attending provider.     _____________________________________   Eppie Gibson, MD

## 2014-12-22 ENCOUNTER — Ambulatory Visit: Payer: Self-pay | Admitting: Internal Medicine

## 2014-12-24 ENCOUNTER — Telehealth: Payer: Self-pay | Admitting: Medical Oncology

## 2014-12-24 ENCOUNTER — Other Ambulatory Visit: Payer: Self-pay | Admitting: Radiation Oncology

## 2014-12-24 DIAGNOSIS — C163 Malignant neoplasm of pyloric antrum: Secondary | ICD-10-CM

## 2014-12-24 NOTE — Telephone Encounter (Signed)
Received a fax for home care orders. I instructed [facility] to contact PCP for orders.

## 2014-12-24 NOTE — Progress Notes (Signed)
I let Paula [name] know by phone today that the main GI tumor board recommendation was for a CT angiogram in 6 months. Radiology wasn't too impressed with the anomaly, with the low risk of a stroke being the major potential issue. It could be coiled potentially. They recommended a scan in 6 months to make sure it doesn't enlarge. If so, Paula [name] could be referred to interventional radiology for management. Other recs were a genetics referral and a repeat endoscopy with it looking like that had not been done in awhile. I made these referrals per her wishes. -----------------------------------  Paula Gibson, MD

## 2014-12-25 ENCOUNTER — Emergency Department (HOSPITAL_COMMUNITY)
Admission: EM | Admit: 2014-12-25 | Discharge: 2014-12-25 | Disposition: A | Discharge status: Home / Self Care | Payer: Medicaid Other | Attending: Emergency Medicine | Admitting: Emergency Medicine

## 2014-12-25 ENCOUNTER — Encounter (HOSPITAL_COMMUNITY): Payer: Self-pay | Admitting: Emergency Medicine

## 2014-12-25 ENCOUNTER — Other Ambulatory Visit: Payer: Self-pay | Admitting: Medical Oncology

## 2014-12-25 ENCOUNTER — Telehealth: Payer: Self-pay | Admitting: *Deleted

## 2014-12-25 DIAGNOSIS — Z85028 Personal history of other malignant neoplasm of stomach: Secondary | ICD-10-CM | POA: Insufficient documentation

## 2014-12-25 DIAGNOSIS — Z8701 Personal history of pneumonia (recurrent): Secondary | ICD-10-CM | POA: Insufficient documentation

## 2014-12-25 DIAGNOSIS — Z8742 Personal history of other diseases of the female genital tract: Secondary | ICD-10-CM | POA: Diagnosis not present

## 2014-12-25 DIAGNOSIS — R011 Cardiac murmur, unspecified: Secondary | ICD-10-CM | POA: Diagnosis not present

## 2014-12-25 DIAGNOSIS — Z903 Acquired absence of stomach [part of]: Secondary | ICD-10-CM | POA: Insufficient documentation

## 2014-12-25 DIAGNOSIS — R112 Nausea with vomiting, unspecified: Secondary | ICD-10-CM | POA: Diagnosis not present

## 2014-12-25 DIAGNOSIS — I10 Essential (primary) hypertension: Secondary | ICD-10-CM | POA: Diagnosis not present

## 2014-12-25 DIAGNOSIS — R079 Chest pain, unspecified: Secondary | ICD-10-CM | POA: Insufficient documentation

## 2014-12-25 DIAGNOSIS — Z79899 Other long term (current) drug therapy: Secondary | ICD-10-CM | POA: Insufficient documentation

## 2014-12-25 DIAGNOSIS — Z86711 Personal history of pulmonary embolism: Secondary | ICD-10-CM | POA: Diagnosis not present

## 2014-12-25 DIAGNOSIS — Z7901 Long term (current) use of anticoagulants: Secondary | ICD-10-CM | POA: Insufficient documentation

## 2014-12-25 DIAGNOSIS — Z8719 Personal history of other diseases of the digestive system: Secondary | ICD-10-CM | POA: Insufficient documentation

## 2014-12-25 DIAGNOSIS — Z87891 Personal history of nicotine dependence: Secondary | ICD-10-CM | POA: Diagnosis not present

## 2014-12-25 LAB — CBC
HCT: 32.2 % — ABNORMAL LOW (ref 36.0–46.0)
Hemoglobin: 10.4 g/dL — ABNORMAL LOW (ref 12.0–15.0)
MCH: 28.2 pg (ref 26.0–34.0)
MCHC: 32.3 g/dL (ref 30.0–36.0)
MCV: 87.3 fL (ref 78.0–100.0)
PLATELETS: 372 10*3/uL (ref 150–400)
RBC: 3.69 MIL/uL — AB (ref 3.87–5.11)
RDW: 14.6 % (ref 11.5–15.5)
WBC: 4.9 10*3/uL (ref 4.0–10.5)

## 2014-12-25 LAB — COMPREHENSIVE METABOLIC PANEL
ALT: 19 U/L (ref 14–54)
ANION GAP: 9 (ref 5–15)
AST: 19 U/L (ref 15–41)
Albumin: 4.4 g/dL (ref 3.5–5.0)
Alkaline Phosphatase: 80 U/L (ref 38–126)
BUN: 6 mg/dL (ref 6–20)
CALCIUM: 9.5 mg/dL (ref 8.9–10.3)
CHLORIDE: 103 mmol/L (ref 101–111)
CO2: 23 mmol/L (ref 22–32)
CREATININE: 0.63 mg/dL (ref 0.44–1.00)
Glucose, Bld: 144 mg/dL — ABNORMAL HIGH (ref 65–99)
Potassium: 3.9 mmol/L (ref 3.5–5.1)
SODIUM: 135 mmol/L (ref 135–145)
Total Bilirubin: 0.7 mg/dL (ref 0.3–1.2)
Total Protein: 7.6 g/dL (ref 6.5–8.1)

## 2014-12-25 LAB — LIPASE, BLOOD: LIPASE: 17 U/L (ref 11–51)

## 2014-12-25 MED ORDER — ONDANSETRON HCL 4 MG/2ML IJ SOLN
4.0000 mg | Freq: Once | INTRAMUSCULAR | Status: AC
  Administered 2014-12-25: 4 mg via INTRAVENOUS
  Filled 2014-12-25: qty 2

## 2014-12-25 MED ORDER — HEPARIN SOD (PORK) LOCK FLUSH 100 UNIT/ML IV SOLN
500.0000 [IU] | Freq: Once | INTRAVENOUS | Status: AC
  Administered 2014-12-25: 500 [IU]
  Filled 2014-12-25: qty 5

## 2014-12-25 MED ORDER — LORAZEPAM 2 MG/ML IJ SOLN
1.0000 mg | Freq: Once | INTRAMUSCULAR | Status: AC
  Administered 2014-12-25: 1 mg via INTRAVENOUS
  Filled 2014-12-25: qty 1

## 2014-12-25 MED ORDER — OXYCODONE-ACETAMINOPHEN 5-325 MG PO TABS
1.0000 | ORAL_TABLET | Freq: Once | ORAL | Status: AC
  Administered 2014-12-25: 1 via ORAL
  Filled 2014-12-25: qty 1

## 2014-12-25 MED ORDER — ONDANSETRON 8 MG PO TBDP: 8.0000 mg | ORAL_TABLET | Freq: Three times a day (TID) | ORAL | Status: DC | PRN

## 2014-12-25 MED ORDER — PROMETHAZINE HCL 25 MG RE SUPP: 25.0000 mg | Freq: Four times a day (QID) | RECTAL | Status: DC | PRN

## 2014-12-25 MED ORDER — HYDROMORPHONE HCL 1 MG/ML IJ SOLN
1.0000 mg | Freq: Once | INTRAMUSCULAR | Status: AC
Start: 2014-12-25 — End: 2014-12-25
  Administered 2014-12-25: 1 mg via INTRAVENOUS
  Filled 2014-12-25: qty 1

## 2014-12-25 MED ORDER — SODIUM CHLORIDE 0.9 % IV BOLUS (SEPSIS)
1000.0000 mL | Freq: Once | INTRAVENOUS | Status: AC
  Administered 2014-12-25: 1000 mL via INTRAVENOUS

## 2014-12-25 NOTE — Discharge Instructions (Signed)

## 2014-12-25 NOTE — Telephone Encounter (Signed)
Per Dr [name] I instructed pt to f/u with pcp to evaluate pain. -she stated she has appt with PCP in Nov. " Will you please have Dr [name] to call me". Note to [place].

## 2014-12-25 NOTE — Telephone Encounter (Signed)
Okay to give her a refill of Percocet only

## 2014-12-25 NOTE — ED Notes (Signed)
Pt states that she began having cp and back pain last night.  States she has been seen for this before and told that she had costochondritis but was dx w/ stomach cancer in June 2015.  States she has been throwing up all night.  Denies diarrhea.  Feels SOB.  Describes pain as aching.

## 2014-12-25 NOTE — ED Provider Notes (Signed)
CSN: [id number]     Arrival date & time 12/25/14  7062 History   First MD Initiated Contact with Patient 12/25/14 [phone]     Chief Complaint  Patient presents with  . Chest Pain     HPI Patient presents emergency department complaining of severe nausea and vomiting.  She states that it seems like once a month she has this happen to her.  She has a history of gastric cancer status post gastrectomy and radiation therapy.  She is in remission as far she knows.  She is scheduled to see GI in the coming weeks for recurrent intermittent nausea and vomiting.  She's being followed closely by her oncology and radiation oncology teams.  No fevers or chills.  She reports severe pain in her chest only when she vomits.  She also has pain in her back when she vomits.  No urinary symptoms.  No hematemesis.  Symptoms are moderate to severe in severity   Past Medical History  Diagnosis Date  . Headache(784.0) 2011    Migraines  . Esophageal reflux     On omeprazole  . Costochondritis   . Hypertension     started around 2011  . Ovarian cyst   . Stomach cancer ([place])   . Family history of cancer   . Heart murmur   . On antineoplastic chemotherapy     5-FU and leucovorin  . Pneumonia   . Pulmonary emboli [facility])    Past Surgical History  Procedure Laterality Date  . Oophorectomy      Around 1985 left ovary removal  . Tonsillectomy      Around 1994  . Tubal ligation    . Esophagogastroduodenoscopy N/A 09/16/2013    Procedure: ESOPHAGOGASTRODUODENOSCOPY (EGD);  Surgeon: Wonda Horner, MD;  Location: [facility];  Service: Endoscopy;  Laterality: N/A;  . Laparoscopy N/A 09/20/2013    Procedure: DIAGNOSTIC LAPAROSCOPY,DISTAL GASTRECTOMY AND FEEDING GASTROJEJUNOSTOMY;  Surgeon: [name], MD;  Location: [facility];  Service: General;  Laterality: N/A;  . Portacath placement Left 11/18/2013    Procedure: INSERTION PORT-A-CATH;  Surgeon: [name], MD;  Location: [facility];  Service: General;  Laterality:  Left;   Family History  Problem Relation Age of Onset  . Hypertension Mother   . Diabetes Mellitus II Mother   . Hypertension Sister   . Hypertension Brother   . Heart failure Maternal Grandmother   . Hypertension Maternal Grandfather   . Prostate cancer Paternal Uncle     living at 83  . Gastric cancer Cousin 43    pat first cousin; daughter of pat uncle living with prostate ca   . Gastric cancer Cousin     mat first cousin; daughter of mat aunt who died in 73s due to heart issues  . Prostate cancer Paternal Uncle     deceased  . Prostate cancer Paternal Uncle     deceased  . Lung cancer Paternal Uncle     smoker; deceased   Social History  Substance Use Topics  . Smoking status: Former Research scientist (life sciences)  . Smokeless tobacco: Never Used     Comment: Stopped 2004  . Alcohol Use: No     Comment: wine at dinner occasional   OB History    No data available     Review of Systems  All other systems reviewed and are negative.     Allergies  Review of patient's allergies indicates no known allergies.  Home Medications   Prior to Admission medications  Medication Sig Start Date End Date Taking? Authorizing Provider  carvedilol (COREG) 6.25 MG tablet Take 6.25 mg by mouth 2 (two) times daily with a meal.   Yes Historical Provider, MD  enoxaparin (LOVENOX) 60 MG/0.6ML injection Inject 0.5 mLs (50 mg total) into the skin daily. Patient taking differently: Inject 50 mg into the skin every 12 (twelve) hours.  12/08/14  Yes Curt Bears, MD  Multiple Vitamins-Minerals (MULTIVITAMIN & MINERAL PO) Take 1 tablet by mouth daily.   Yes Historical Provider, MD  promethazine (PHENERGAN) 25 MG tablet Take 1 tablet (25 mg total) by mouth every 6 (six) hours as needed for nausea or vomiting. 12/18/14  Yes Curt Bears, MD  ondansetron (ZOFRAN ODT) 8 MG disintegrating tablet Take 1 tablet (8 mg total) by mouth every 8 (eight) hours as needed for nausea or vomiting. 12/25/14   Jola Schmidt, MD   promethazine (PHENERGAN) 25 MG suppository Place 1 suppository (25 mg total) rectally every 6 (six) hours as needed for nausea or vomiting. 12/25/14   Jola Schmidt, MD   BP 133/89 mmHg  Pulse 63  Temp(Src) 98 F (36.7 C) (Oral)  Resp 16  SpO2 100% Physical Exam  Constitutional: She is oriented to person, place, and time. She appears well-developed and well-nourished.  Uncomfortable appearing  HENT:  Head: Normocephalic and atraumatic.  Eyes: EOM are normal.  Neck: Normal range of motion.  Cardiovascular: Normal rate, regular rhythm and normal heart sounds.   Pulmonary/Chest: Effort normal and breath sounds normal.  Abdominal: Soft. She exhibits no distension. There is no tenderness.  Musculoskeletal: Normal range of motion.  Neurological: She is alert and oriented to person, place, and time.  Skin: Skin is warm and dry.  Psychiatric: She has a normal mood and affect. Judgment normal.  Nursing note and vitals reviewed.   ED Course  Procedures (including critical care time) Labs Review Labs Reviewed  CBC - Abnormal; Notable for the following:    RBC 3.69 (*)    Hemoglobin 10.4 (*)    HCT 32.2 (*)    All other components within normal limits  COMPREHENSIVE METABOLIC PANEL - Abnormal; Notable for the following:    Glucose, Bld 144 (*)    All other components within normal limits  LIPASE, BLOOD    Imaging Review No results found. I have personally reviewed and evaluated these images and lab results as part of my medical decision-making.   EKG Interpretation   Date/Time:  Thursday December 25 2014 09:15:53 EDT Ventricular Rate:  90 PR Interval:  116 QRS Duration: 70 QT Interval:  359 QTC Calculation: 439 R Axis:   63 Text Interpretation:  Sinus rhythm Borderline short PR interval Consider  left ventricular hypertrophy No significant change was found Confirmed by  Theopolis Sloop  MD, Corwyn Vora ([id number]) on 12/25/2014 9:22:37 AM      MDM   Final diagnoses:  Nausea and  vomiting, vomiting of unspecified type    11:01 AM Patient is feeling much better at this time.  She's keeping sips down.  Labs without significant abnormality.  Repeat abdominal exam is benign.  Discharge home in good condition.  No indication for additional workup.  In emergency department.  She is scheduled to see GI as encouraged her to follow-up with this appointment patient May benefit from repeat endoscopy.  No indication for admission or additional testing at this time.  Close primary care and GI follow-up.  Patient understands to return to the ER for new or worsening symptoms  Jola Schmidt, MD 12/25/14 1102

## 2014-12-25 NOTE — Telephone Encounter (Signed)
"  I need a refill from Dr. [name] for pain medication.  Percocet, hydrocodone, oxycodone are things he's ordered before but I do not have anything.  I was in the ER with the worse pain ever to my chest, back, stomach.  I thought I was having a heart attack.  I was given zofran ODT for N/V because I threw up from 5:00 pm yesterday until this morning.  The ED would not give me anything for pain saying I need to call Dr. [name].  Dr. Isidore Moos was called.  Dr. Isidore Moos says I was discussed at the Tumor Board yesterday.  Return number [phone].

## 2014-12-25 NOTE — Telephone Encounter (Signed)
see note

## 2014-12-29 ENCOUNTER — Emergency Department (HOSPITAL_COMMUNITY)
Admission: EM | Admit: 2014-12-29 | Discharge: 2014-12-30 | Disposition: A | Discharge status: Home / Self Care | Payer: Medicaid Other | Attending: Emergency Medicine | Admitting: Emergency Medicine

## 2014-12-29 ENCOUNTER — Emergency Department (HOSPITAL_COMMUNITY): Payer: Medicaid Other

## 2014-12-29 ENCOUNTER — Encounter (HOSPITAL_COMMUNITY): Payer: Self-pay | Admitting: Emergency Medicine

## 2014-12-29 ENCOUNTER — Telehealth: Payer: Self-pay | Admitting: *Deleted

## 2014-12-29 DIAGNOSIS — I10 Essential (primary) hypertension: Secondary | ICD-10-CM | POA: Insufficient documentation

## 2014-12-29 DIAGNOSIS — Z85028 Personal history of other malignant neoplasm of stomach: Secondary | ICD-10-CM | POA: Diagnosis not present

## 2014-12-29 DIAGNOSIS — E876 Hypokalemia: Secondary | ICD-10-CM | POA: Insufficient documentation

## 2014-12-29 DIAGNOSIS — Z79899 Other long term (current) drug therapy: Secondary | ICD-10-CM | POA: Diagnosis not present

## 2014-12-29 DIAGNOSIS — R079 Chest pain, unspecified: Secondary | ICD-10-CM | POA: Insufficient documentation

## 2014-12-29 DIAGNOSIS — Z8701 Personal history of pneumonia (recurrent): Secondary | ICD-10-CM | POA: Diagnosis not present

## 2014-12-29 DIAGNOSIS — Z9851 Tubal ligation status: Secondary | ICD-10-CM | POA: Insufficient documentation

## 2014-12-29 DIAGNOSIS — Z87891 Personal history of nicotine dependence: Secondary | ICD-10-CM | POA: Insufficient documentation

## 2014-12-29 DIAGNOSIS — R011 Cardiac murmur, unspecified: Secondary | ICD-10-CM | POA: Insufficient documentation

## 2014-12-29 DIAGNOSIS — M549 Dorsalgia, unspecified: Secondary | ICD-10-CM | POA: Insufficient documentation

## 2014-12-29 DIAGNOSIS — Z8742 Personal history of other diseases of the female genital tract: Secondary | ICD-10-CM | POA: Insufficient documentation

## 2014-12-29 DIAGNOSIS — E871 Hypo-osmolality and hyponatremia: Secondary | ICD-10-CM | POA: Insufficient documentation

## 2014-12-29 DIAGNOSIS — K219 Gastro-esophageal reflux disease without esophagitis: Secondary | ICD-10-CM | POA: Diagnosis not present

## 2014-12-29 DIAGNOSIS — Z7901 Long term (current) use of anticoagulants: Secondary | ICD-10-CM | POA: Diagnosis not present

## 2014-12-29 DIAGNOSIS — R509 Fever, unspecified: Secondary | ICD-10-CM | POA: Diagnosis present

## 2014-12-29 DIAGNOSIS — R109 Unspecified abdominal pain: Secondary | ICD-10-CM | POA: Diagnosis not present

## 2014-12-29 DIAGNOSIS — Z86711 Personal history of pulmonary embolism: Secondary | ICD-10-CM | POA: Insufficient documentation

## 2014-12-29 DIAGNOSIS — R112 Nausea with vomiting, unspecified: Secondary | ICD-10-CM

## 2014-12-29 LAB — BASIC METABOLIC PANEL
Anion gap: 11 (ref 5–15)
BUN: 16 mg/dL (ref 6–20)
CHLORIDE: 94 mmol/L — AB (ref 101–111)
CO2: 25 mmol/L (ref 22–32)
CREATININE: 0.94 mg/dL (ref 0.44–1.00)
Calcium: 9.6 mg/dL (ref 8.9–10.3)
GFR calc non Af Amer: >60 mL/min (ref >60)
Glucose, Bld: 110 mg/dL — ABNORMAL HIGH (ref 65–99)
POTASSIUM: 3.1 mmol/L — AB (ref 3.5–5.1)
SODIUM: 130 mmol/L — AB (ref 135–145)

## 2014-12-29 LAB — DIFFERENTIAL
BASOS ABS: 0 10*3/uL (ref 0.0–0.1)
BASOS PCT: 0 %
Eosinophils Absolute: 0 10*3/uL (ref 0.0–0.7)
Eosinophils Relative: 1 %
Lymphocytes Relative: 28 %
Lymphs Abs: 1 10*3/uL (ref 0.7–4.0)
Monocytes Absolute: 0.4 10*3/uL (ref 0.1–1.0)
Monocytes Relative: 12 %
NEUTROS ABS: 2.1 10*3/uL (ref 1.7–7.7)
NEUTROS PCT: 59 %

## 2014-12-29 LAB — CBC
HCT: 39.6 % (ref 36.0–46.0)
Hemoglobin: 13.3 g/dL (ref 12.0–15.0)
MCH: 28.4 pg (ref 26.0–34.0)
MCHC: 33.6 g/dL (ref 30.0–36.0)
MCV: 84.6 fL (ref 78.0–100.0)
PLATELETS: 349 10*3/uL (ref 150–400)
RBC: 4.68 MIL/uL (ref 3.87–5.11)
RDW: 13.9 % (ref 11.5–15.5)
WBC: 3.6 10*3/uL — AB (ref 4.0–10.5)

## 2014-12-29 LAB — TROPONIN I

## 2014-12-29 LAB — LIPASE, BLOOD: LIPASE: 17 U/L (ref 11–51)

## 2014-12-29 MED ORDER — HYDROMORPHONE HCL 1 MG/ML IJ SOLN
0.5000 mg | Freq: Once | INTRAMUSCULAR | Status: AC
  Administered 2014-12-30: 0.5 mg via INTRAVENOUS
  Filled 2014-12-29: qty 1

## 2014-12-29 MED ORDER — POTASSIUM CHLORIDE 10 MEQ/100ML IV SOLN
10.0000 meq | Freq: Once | INTRAVENOUS | Status: AC
  Administered 2014-12-29: 10 meq via INTRAVENOUS
  Filled 2014-12-29: qty 100

## 2014-12-29 MED ORDER — PROMETHAZINE HCL 25 MG/ML IJ SOLN
25.0000 mg | Freq: Once | INTRAMUSCULAR | Status: AC
  Administered 2014-12-29: 25 mg via INTRAVENOUS
  Filled 2014-12-29: qty 1

## 2014-12-29 MED ORDER — PROMETHAZINE HCL 25 MG RE SUPP: 25.0000 mg | Freq: Four times a day (QID) | RECTAL | Status: DC | PRN

## 2014-12-29 MED ORDER — SODIUM CHLORIDE 0.9 % IV BOLUS (SEPSIS)
1000.0000 mL | Freq: Once | INTRAVENOUS | Status: AC
  Administered 2014-12-29: 1000 mL via INTRAVENOUS

## 2014-12-29 MED ORDER — SODIUM CHLORIDE 0.9 % IV SOLN: 1000.0000 mL | INTRAVENOUS | Status: DC

## 2014-12-29 MED ORDER — HYDROMORPHONE HCL 1 MG/ML IJ SOLN
1.0000 mg | Freq: Once | INTRAMUSCULAR | Status: AC
  Administered 2014-12-29: 1 mg via INTRAVENOUS
  Filled 2014-12-29: qty 1

## 2014-12-29 MED ORDER — SODIUM CHLORIDE 0.9 % IV SOLN
1000.0000 mL | Freq: Once | INTRAVENOUS | Status: AC
  Administered 2014-12-29: 1000 mL via INTRAVENOUS

## 2014-12-29 NOTE — Discharge Instructions (Signed)
See your gastroenterologist tomorrow as scheduled.   Nausea and Vomiting Nausea is a sick feeling that often comes before throwing up (vomiting). Vomiting is a reflex where stomach contents come out of your mouth. Vomiting can cause severe loss of body fluids (dehydration). Children and elderly adults can become dehydrated quickly, especially if they also have diarrhea. Nausea and vomiting are symptoms of a condition or disease. It is important to find the cause of your symptoms. CAUSES   Direct irritation of the stomach lining. This irritation can result from increased acid production (gastroesophageal reflux disease), infection, food poisoning, taking certain medicines (such as nonsteroidal anti-inflammatory drugs), alcohol use, or tobacco use.  Signals from the brain.These signals could be caused by a headache, heat exposure, an inner ear disturbance, increased pressure in the brain from injury, infection, a tumor, or a concussion, pain, emotional stimulus, or metabolic problems.  An obstruction in the gastrointestinal tract (bowel obstruction).  Illnesses such as diabetes, hepatitis, gallbladder problems, appendicitis, kidney problems, cancer, sepsis, atypical symptoms of a heart attack, or eating disorders.  Medical treatments such as chemotherapy and radiation.  Receiving medicine that makes you sleep (general anesthetic) during surgery. DIAGNOSIS Your caregiver may ask for tests to be done if the problems do not improve after a few days. Tests may also be done if symptoms are severe or if the reason for the nausea and vomiting is not clear. Tests may include:  Urine tests.  Blood tests.  Stool tests.  Cultures (to look for evidence of infection).  X-rays or other imaging studies. Test results can help your caregiver make decisions about treatment or the need for additional tests. TREATMENT You need to stay well hydrated. Drink frequently but in small amounts.You may wish to  drink water, sports drinks, clear broth, or eat frozen ice pops or gelatin dessert to help stay hydrated.When you eat, eating slowly may help prevent nausea.There are also some antinausea medicines that may help prevent nausea. HOME CARE INSTRUCTIONS   Take all medicine as directed by your caregiver.  If you do not have an appetite, do not force yourself to eat. However, you must continue to drink fluids.  If you have an appetite, eat a normal diet unless your caregiver tells you differently.  Eat a variety of complex carbohydrates (rice, wheat, potatoes, bread), lean meats, yogurt, fruits, and vegetables.  Avoid high-fat foods because they are more difficult to digest.  Drink enough water and fluids to keep your urine clear or pale yellow.  If you are dehydrated, ask your caregiver for specific rehydration instructions. Signs of dehydration may include:  Severe thirst.  Dry lips and mouth.  Dizziness.  Dark urine.  Decreasing urine frequency and amount.  Confusion.  Rapid breathing or pulse. SEEK IMMEDIATE MEDICAL CARE IF:   You have blood or brown flecks (like coffee grounds) in your vomit.  You have black or bloody stools.  You have a severe headache or stiff neck.  You are confused.  You have severe abdominal pain.  You have chest pain or trouble breathing.  You do not urinate at least once every 8 hours.  You develop cold or clammy skin.  You continue to vomit for longer than 24 to 48 hours.  You have a fever. MAKE SURE YOU:   Understand these instructions.  Will watch your condition.  Will get help right away if you are not doing well or get worse.   This information is not intended to replace advice given to  you by your health care provider. Make sure you discuss any questions you have with your health care provider.   Document Released: 02/07/2005 Document Revised: 05/02/2011 Document Reviewed: 07/07/2010 Elsevier Interactive Patient Education  2016 Elsevier Inc.  Promethazine suppositories What is this medicine? PROMETHAZINE (proe METH a zeen) is an antihistamine. It is used to treat allergic reactions and to treat or prevent nausea and vomiting from illness or motion sickness. It is also used to make you sleep before surgery, and to help treat pain or nausea after surgery. This medicine may be used for other purposes; ask your health care provider or pharmacist if you have questions. What should I tell my health care provider before I take this medicine? They need to know if you have any of these conditions: -glaucoma -high blood pressure or heart disease -kidney disease -liver disease -lung or breathing disease, like asthma -prostate trouble -pain or difficulty passing urine -seizures -an unusual or allergic reaction to promethazine or phenothiazines, other medicines, foods, dyes, or preservatives -pregnant or trying to get pregnant -breast-feeding How should I use this medicine? This medicine is for rectal use only. Do not take by mouth. Wash your hands before and after use. Take off the foil wrapping. Wet the tip of the suppository with cold tap water to make it easier to use. Lie on your side with your lower leg straightened out and your upper leg bent forward toward your stomach. Lift upper buttock to expose the rectal area. Apply gentle pressure to insert the suppository completely into the rectum, pointed end first. Hold buttocks together for a few seconds. Remain lying down for about 15 minutes to avoid having the suppository come out. Do not use more often than directed. Talk to your pediatrician regarding the use of this medicine in children. Special care may be needed. This medicine should not be given to infants and children younger than 22 years old. Overdosage: If you think you have taken too much of this medicine contact a poison control center or emergency room at once. NOTE: This medicine is only for you. Do not  share this medicine with others. What if I miss a dose? If you miss a dose, use it as soon as you can. If it is almost time for your next dose, use only that dose. Do not use double doses. What may interact with this medicine? Do not take this medicine with any of the following medications: -cisapride -dofetilide -dronedarone -MAOIs like Carbex, Eldepryl, Marplan, Nardil, Parnate -pimozide -quinidine, including dextromethorphan; quinidine -thioridazine -ziprasidone This medicine may also interact with the following medications: -certain medicines for depression, anxiety, or psychotic disturbances -certain medicines for anxiety or sleep -certain medicines for seizures like carbamazepine, phenobarbital, phenytoin -certain medicines for movement abnormalities as in Parkinson's disease, or for gastrointestinal problems -epinephrine -medicines for allergies or colds -muscle relaxants -narcotic medicines for pain -other medicines that prolong the QT interval (cause an abnormal heart rhythm) -tramadol -trimethobenzamide This list may not describe all possible interactions. Give your health care provider a list of all the medicines, herbs, non-prescription drugs, or dietary supplements you use. Also tell them if you smoke, drink alcohol, or use illegal drugs. Some items may interact with your medicine. What should I watch for while using this medicine? Tell your doctor or health care professional if your symptoms do not start to get better in 1 to 2 days. You may get drowsy or dizzy. Do not drive, use machinery, or do anything that needs mental alertness  until you know how this medicine affects you. To reduce the risk of dizzy or fainting spells, do not stand or sit up quickly, especially if you are an older patient. Alcohol may increase dizziness and drowsiness. Avoid alcoholic drinks. Your mouth may get dry. Chewing sugarless gum or sucking hard candy, and drinking plenty of water may help.  Contact your doctor if the problem does not go away or is severe. This medicine may cause dry eyes and blurred vision. If you wear contact lenses you may feel some discomfort. Lubricating drops may help. See your eye doctor if the problem does not go away or is severe. This medicine can make you more sensitive to the sun. Keep out of the sun. If you cannot avoid being in the sun, wear protective clothing and use sunscreen. Do not use sun lamps or tanning beds/booths. If you are diabetic, check your blood-sugar levels regularly. What side effects may I notice from receiving this medicine? Side effects that you should report to your doctor or health care professional as soon as possible: -blurred vision -irregular heartbeat, palpitations or chest pain -muscle or facial twitches -pain or difficulty passing urine -seizures -skin rash -slowed or shallow breathing -unusual bleeding or bruising -yellowing of the eyes or skin Side effects that usually do not require medical attention (report to your doctor or health care professional if they continue or are bothersome): -headache -nightmares, agitation, nervousness, excitability, not able to sleep (these are more likely in children) -stuffy nose This list may not describe all possible side effects. Call your doctor for medical advice about side effects. You may report side effects to FDA at 1-800-FDA-1088. Where should I keep my medicine? Keep out of the reach of children. Store in a refrigerator between 2 and 8 degrees C (36 and 46 degrees F). Throw away any unused medicine after the expiration date. NOTE: This sheet is a summary. It may not cover all possible information. If you have questions about this medicine, talk to your doctor, pharmacist, or health care provider.    2016, Elsevier/Gold Standard. (2012-10-10 15:57:29)

## 2014-12-29 NOTE — ED Provider Notes (Signed)
CSN: [id number]     Arrival date & time 12/29/14  1804 History   First MD Initiated Contact with Patient 12/29/14 1858     Chief Complaint  Patient presents with  . fever/back pain      (Consider location/radiation/quality/duration/timing/severity/associated sxs/prior Treatment) The history is provided by the patient.   49 year old female with history of recurrent nausea and vomiting for the past year comes in with ongoing problems for same. She was in the ED 4 days ago and states that she felt okay when she left, but vomiting recurred only a few hours after leaving. She is complaining of pain in her abdomen, lower chest, and bilateral flanks. This is typical of her pain. She has ondansetron and promethazine at home but they have been ineffective. She denies fever or chills or sweats. Current symptoms are typical of what she has had in the past.  Past Medical History  Diagnosis Date  . Headache(784.0) 2011    Migraines  . Esophageal reflux     On omeprazole  . Costochondritis   . Hypertension     started around 2011  . Ovarian cyst   . Stomach cancer ([place])   . Family history of cancer   . Heart murmur   . On antineoplastic chemotherapy     5-FU and leucovorin  . Pneumonia   . Pulmonary emboli [facility])    Past Surgical History  Procedure Laterality Date  . Oophorectomy      Around 1985 left ovary removal  . Tonsillectomy      Around 1994  . Tubal ligation    . Esophagogastroduodenoscopy N/A 09/16/2013    Procedure: ESOPHAGOGASTRODUODENOSCOPY (EGD);  Surgeon: Wonda Horner, MD;  Location: [facility];  Service: Endoscopy;  Laterality: N/A;  . Laparoscopy N/A 09/20/2013    Procedure: DIAGNOSTIC LAPAROSCOPY,DISTAL GASTRECTOMY AND FEEDING GASTROJEJUNOSTOMY;  Surgeon: [name], MD;  Location: [facility];  Service: General;  Laterality: N/A;  . Portacath placement Left 11/18/2013    Procedure: INSERTION PORT-A-CATH;  Surgeon: [name], MD;  Location: [facility];  Service: General;   Laterality: Left;   Family History  Problem Relation Age of Onset  . Hypertension Mother   . Diabetes Mellitus II Mother   . Hypertension Sister   . Hypertension Brother   . Heart failure Maternal Grandmother   . Hypertension Maternal Grandfather   . Prostate cancer Paternal Uncle     living at 65  . Gastric cancer Cousin 69    pat first cousin; daughter of pat uncle living with prostate ca   . Gastric cancer Cousin     mat first cousin; daughter of mat aunt who died in 46s due to heart issues  . Prostate cancer Paternal Uncle     deceased  . Prostate cancer Paternal Uncle     deceased  . Lung cancer Paternal Uncle     smoker; deceased   Social History  Substance Use Topics  . Smoking status: Former Research scientist (life sciences)  . Smokeless tobacco: Never Used     Comment: Stopped 2004  . Alcohol Use: No     Comment: wine at dinner occasional   OB History    No data available     Review of Systems  All other systems reviewed and are negative.     Allergies  Review of patient's allergies indicates no known allergies.  Home Medications   Prior to Admission medications   Medication Sig Start Date End Date Taking? Authorizing Provider  carvedilol (COREG) 6.25  MG tablet Take 6.25 mg by mouth 2 (two) times daily with a meal.    Historical Provider, MD  enoxaparin (LOVENOX) 60 MG/0.6ML injection Inject 0.5 mLs (50 mg total) into the skin daily. Patient taking differently: Inject 50 mg into the skin every 12 (twelve) hours.  12/08/14   Curt Bears, MD  Multiple Vitamins-Minerals (MULTIVITAMIN & MINERAL PO) Take 1 tablet by mouth daily.    Historical Provider, MD  ondansetron (ZOFRAN ODT) 8 MG disintegrating tablet Take 1 tablet (8 mg total) by mouth every 8 (eight) hours as needed for nausea or vomiting. 12/25/14   Jola Schmidt, MD  promethazine (PHENERGAN) 25 MG suppository Place 1 suppository (25 mg total) rectally every 6 (six) hours as needed for nausea or vomiting. 12/25/14   Jola Schmidt, MD  promethazine (PHENERGAN) 25 MG tablet Take 1 tablet (25 mg total) by mouth every 6 (six) hours as needed for nausea or vomiting. 12/18/14   Curt Bears, MD   BP 161/104 mmHg  Pulse 106  Temp(Src) 99.3 F (37.4 C) (Oral)  Resp 18  SpO2 100% Physical Exam  Nursing note and vitals reviewed.  49 year old female, resting comfortably and in no acute distress. Vital signs are significant for tachycardia and hypertension. Oxygen saturation is 100%, which is normal. Head is normocephalic and atraumatic. PERRLA, EOMI. Oropharynx is clear. Neck is nontender and supple without adenopathy or JVD. Back is nontender and there is no CVA tenderness. Lungs are clear without rales, wheezes, or rhonchi. Chest is nontender. Mediport is present in the left anterior chest. Heart has regular rate and rhythm without murmur. Abdomen is soft, flat, nontender without masses or hepatosplenomegaly and peristalsis is hypoactive. Extremities have no cyanosis or edema, full range of motion is present. Skin is warm and dry without rash. Neurologic: Mental status is normal, cranial nerves are intact, there are no motor or sensory deficits.  ED Course  Procedures (including critical care time) Labs Review Results for orders placed or performed during the hospital encounter of 16/01/09  Basic metabolic panel  Result Value Ref Range   Sodium 130 (L) 135 - 145 mmol/L   Potassium 3.1 (L) 3.5 - 5.1 mmol/L   Chloride 94 (L) 101 - 111 mmol/L   CO2 25 22 - 32 mmol/L   Glucose, Bld 110 (H) 65 - 99 mg/dL   BUN 16 6 - 20 mg/dL   Creatinine, Ser 0.94 0.44 - 1.00 mg/dL   Calcium 9.6 8.9 - 10.3 mg/dL   GFR calc non Af Amer >60 >60 mL/min   GFR calc Af Amer >60 >60 mL/min   Anion gap 11 5 - 15  CBC  Result Value Ref Range   WBC 3.6 (L) 4.0 - 10.5 K/uL   RBC 4.68 3.87 - 5.11 MIL/uL   Hemoglobin 13.3 12.0 - 15.0 g/dL   HCT 39.6 36.0 - 46.0 %   MCV 84.6 78.0 - 100.0 fL   MCH 28.4 26.0 - 34.0 pg   MCHC  33.6 30.0 - 36.0 g/dL   RDW 13.9 11.5 - 15.5 %   Platelets 349 150 - 400 K/uL  Differential  Result Value Ref Range   Neutrophils Relative % 59 %   Neutro Abs 2.1 1.7 - 7.7 K/uL   Lymphocytes Relative 28 %   Lymphs Abs 1.0 0.7 - 4.0 K/uL   Monocytes Relative 12 %   Monocytes Absolute 0.4 0.1 - 1.0 K/uL   Eosinophils Relative 1 %   Eosinophils Absolute 0.0  0.0 - 0.7 K/uL   Basophils Relative 0 %   Basophils Absolute 0.0 0.0 - 0.1 K/uL  Lipase, blood  Result Value Ref Range   Lipase 17 11 - 51 U/L  Troponin I  Result Value Ref Range   Troponin I <0.03 <0.031 ng/mL   Imaging Review Dg Chest 2 View  12/29/2014  CLINICAL DATA:  Back pain, fever, nausea, vomiting and chest pain for 1 week. Subsequent encounter. EXAM: CHEST  2 VIEW COMPARISON:  PA and lateral chest 12/09/2014.  CT chest 07/31/2014. FINDINGS: Chronic scar in the right lower lobe is identified as on the prior examinations. The lungs are otherwise clear. Heart size is normal. No pneumothorax or pleural effusion. Port-A-Cath is unchanged. IMPRESSION: No acute disease. Electronically Signed   By: Inge Rise M.D.   On: 12/29/2014 19:14   I have personally reviewed and evaluated these images and lab results as part of my medical decision-making.   EKG Interpretation   Date/Time:  Monday December 29 2014 18:37:46 EST Ventricular Rate:  90 PR Interval:  95 QRS Duration: 71 QT Interval:  380 QTC Calculation: 465 R Axis:   62 Text Interpretation:  Sinus rhythm Short PR interval Consider right atrial  enlargement LVH with secondary repolarization abnormality When compared  with ECG of 12/25/2014, Repolariztion abnormality is now present Confirmed  by [facility]  MD, Aksel Bencomo ([id number]) on 12/29/2014 9:35:44 PM      MDM   Final diagnoses:  Nausea and vomiting, vomiting of unspecified type  Hypokalemia  Hyponatremia    Recurrent nausea and vomiting. Old records are reviewed and she also has problems with them prescription  drug use. Her oncologist has been trying to get her to get all of her narcotic prescriptions through primary care provider. Initial labs do show leukopenia which is chronic, mild hyponatremia, and mild hypokalemia. Old records are reviewed confirming recent ED visit with similar complaints and multiple other ED visits with similar complaints. She does have diagnosis of stomach cancer but most recent CT showed no evidence of residual disease. She'll be given IV fluids, IV potassium, IV promethazine, and IV hydromorphone. She apparently has nothing to take at home if she starts vomiting, so I do anticipate discharge with prescription for promethazine suppository. She is requesting admission for workup but she is advised that this is an outpatient workup unless laboratory evaluation shows need for admission, which it does not.  She feels somewhat better after above noted treatment. She will be discharged with prescription for promethazine suppository. Patient is requesting prescription for something for pain at home. I agreed to give her another injection of hydromorphone but stated that she would not get any prescription for pain. She is supposed to see her gastroenterologist in the morning and is to keep that appointment.  Delora Fuel, MD 29/92/42 [id number]

## 2014-12-29 NOTE — Telephone Encounter (Signed)
[facility], RN called on behalf of pt states " pt is moaning because she is in so much pain, lung sounds are diminished, pt went to urgent care earlier today and they only gave her phenergan and tramadol. Her husband is currently out getting it filled. She has chest pain from vomiting and I think she should be admitted. Reviewed with MD who instructed pt to go to ED to be further evaluated.

## 2014-12-29 NOTE — ED Notes (Signed)
Per pt, states she was here last week for same symptoms-have back pain, fever, and chest pain

## 2014-12-29 NOTE — ED Notes (Signed)
Patient transported to X-ray 

## 2014-12-30 ENCOUNTER — Telehealth: Payer: Self-pay | Admitting: *Deleted

## 2014-12-30 ENCOUNTER — Other Ambulatory Visit: Payer: Self-pay | Admitting: Gastroenterology

## 2014-12-30 ENCOUNTER — Telehealth: Payer: Self-pay | Admitting: Genetic Counselor

## 2014-12-30 DIAGNOSIS — R112 Nausea with vomiting, unspecified: Secondary | ICD-10-CM

## 2014-12-30 MED ORDER — HEPARIN SOD (PORK) LOCK FLUSH 100 UNIT/ML IV SOLN
500.0000 [IU] | Freq: Once | INTRAVENOUS | Status: AC
  Administered 2014-12-30: 500 [IU]
  Filled 2014-12-30: qty 5

## 2014-12-30 NOTE — Telephone Encounter (Signed)
Called patient to inform of appt. With Dr. Penelope Coop on 12-30-14 @ 1 pm and her scans on 06-22-15 and her fu visit with Dr. Isidore Moos on 08-14-15, I have informed her that I have called the genetics scheduler upstairs but I haven't heard back from at this time, I told her that I would follow this and hopefully that I will know something by the end of this week

## 2014-12-30 NOTE — Telephone Encounter (Signed)
CALLED PATIENT TO INFORM OF APPT. WITH KAYLA BOGGS ON 01-13-15 @ 10 AM, (GENETICS) , SPOKE WITH PATIENT AND SHE IS AWARE OF THIS APPT.

## 2014-12-30 NOTE — Telephone Encounter (Signed)
Gave genetic appt for 11/22 @ 10 w/[employer]

## 2014-12-31 ENCOUNTER — Telehealth: Payer: Self-pay | Admitting: Internal Medicine

## 2014-12-31 NOTE — Telephone Encounter (Signed)
MAILED PTS MEDICAL RECORDS TO [facility]

## 2015-01-07 ENCOUNTER — Other Ambulatory Visit: Payer: Self-pay | Admitting: Gastroenterology

## 2015-01-07 ENCOUNTER — Ambulatory Visit
Admission: RE | Admit: 2015-01-07 | Discharge: 2015-01-07 | Disposition: A | Discharge status: Home / Self Care | Payer: Medicaid Other | Source: Ambulatory Visit | Attending: Gastroenterology | Admitting: Gastroenterology

## 2015-01-07 ENCOUNTER — Telehealth: Payer: Self-pay | Admitting: Genetic Counselor

## 2015-01-07 DIAGNOSIS — R112 Nausea with vomiting, unspecified: Secondary | ICD-10-CM

## 2015-01-07 NOTE — Telephone Encounter (Signed)
Discussed with Paula [name] how the genetic counseling and testing process works, including how insurance coverage works.  Briefly reviewed her personal and family history of cancer.  Reminded her that her appt will be at 10 AM on that day and that she can check in at one of the desks downstairs in the lobby of the [facility].  [name] also asked about support group options/somewhere where she can learn more information about gastric cancer.  I discussed that there is a GI cancer support group, which is likely primarily made up with patients who have colorectal cancer, but that prior to her appt on the 22nd, I will find out more about this and any other options for her.

## 2015-01-12 ENCOUNTER — Emergency Department (HOSPITAL_BASED_OUTPATIENT_CLINIC_OR_DEPARTMENT_OTHER): Payer: Medicaid Other

## 2015-01-12 ENCOUNTER — Emergency Department (HOSPITAL_BASED_OUTPATIENT_CLINIC_OR_DEPARTMENT_OTHER)
Admission: EM | Admit: 2015-01-12 | Discharge: 2015-01-12 | Disposition: A | Discharge status: Home / Self Care | Payer: Medicaid Other | Attending: Emergency Medicine | Admitting: Emergency Medicine

## 2015-01-12 ENCOUNTER — Encounter (HOSPITAL_BASED_OUTPATIENT_CLINIC_OR_DEPARTMENT_OTHER): Payer: Self-pay | Admitting: *Deleted

## 2015-01-12 DIAGNOSIS — Z86711 Personal history of pulmonary embolism: Secondary | ICD-10-CM | POA: Diagnosis not present

## 2015-01-12 DIAGNOSIS — R011 Cardiac murmur, unspecified: Secondary | ICD-10-CM | POA: Insufficient documentation

## 2015-01-12 DIAGNOSIS — Z87891 Personal history of nicotine dependence: Secondary | ICD-10-CM | POA: Diagnosis not present

## 2015-01-12 DIAGNOSIS — I10 Essential (primary) hypertension: Secondary | ICD-10-CM | POA: Diagnosis not present

## 2015-01-12 DIAGNOSIS — Z85028 Personal history of other malignant neoplasm of stomach: Secondary | ICD-10-CM | POA: Insufficient documentation

## 2015-01-12 DIAGNOSIS — R1013 Epigastric pain: Secondary | ICD-10-CM

## 2015-01-12 DIAGNOSIS — R112 Nausea with vomiting, unspecified: Secondary | ICD-10-CM

## 2015-01-12 DIAGNOSIS — K2970 Gastritis, unspecified, without bleeding: Secondary | ICD-10-CM

## 2015-01-12 DIAGNOSIS — Z8742 Personal history of other diseases of the female genital tract: Secondary | ICD-10-CM | POA: Insufficient documentation

## 2015-01-12 DIAGNOSIS — R079 Chest pain, unspecified: Secondary | ICD-10-CM | POA: Insufficient documentation

## 2015-01-12 DIAGNOSIS — Z8701 Personal history of pneumonia (recurrent): Secondary | ICD-10-CM | POA: Diagnosis not present

## 2015-01-12 DIAGNOSIS — Z79899 Other long term (current) drug therapy: Secondary | ICD-10-CM | POA: Insufficient documentation

## 2015-01-12 DIAGNOSIS — G43909 Migraine, unspecified, not intractable, without status migrainosus: Secondary | ICD-10-CM | POA: Diagnosis not present

## 2015-01-12 DIAGNOSIS — Z9851 Tubal ligation status: Secondary | ICD-10-CM | POA: Insufficient documentation

## 2015-01-12 DIAGNOSIS — Z8739 Personal history of other diseases of the musculoskeletal system and connective tissue: Secondary | ICD-10-CM | POA: Insufficient documentation

## 2015-01-12 LAB — COMPREHENSIVE METABOLIC PANEL
ALT: 20 U/L (ref 14–54)
AST: 22 U/L (ref 15–41)
Albumin: 4.3 g/dL (ref 3.5–5.0)
Alkaline Phosphatase: 88 U/L (ref 38–126)
Anion gap: 10 (ref 5–15)
BILIRUBIN TOTAL: 0.6 mg/dL (ref 0.3–1.2)
BUN: 8 mg/dL (ref 6–20)
CALCIUM: 9.4 mg/dL (ref 8.9–10.3)
CO2: 22 mmol/L (ref 22–32)
CREATININE: 0.72 mg/dL (ref 0.44–1.00)
Chloride: 105 mmol/L (ref 101–111)
Glucose, Bld: 135 mg/dL — ABNORMAL HIGH (ref 65–99)
Potassium: 3.7 mmol/L (ref 3.5–5.1)
Sodium: 137 mmol/L (ref 135–145)
TOTAL PROTEIN: 7.8 g/dL (ref 6.5–8.1)

## 2015-01-12 LAB — CBC
HCT: 33.5 % — ABNORMAL LOW (ref 36.0–46.0)
Hemoglobin: 11 g/dL — ABNORMAL LOW (ref 12.0–15.0)
MCH: 27.9 pg (ref 26.0–34.0)
MCHC: 32.8 g/dL (ref 30.0–36.0)
MCV: 85 fL (ref 78.0–100.0)
PLATELETS: 310 10*3/uL (ref 150–400)
RBC: 3.94 MIL/uL (ref 3.87–5.11)
RDW: 14.5 % (ref 11.5–15.5)
WBC: 4.3 10*3/uL (ref 4.0–10.5)

## 2015-01-12 LAB — MAGNESIUM: MAGNESIUM: 1.7 mg/dL (ref 1.7–2.4)

## 2015-01-12 LAB — TROPONIN I

## 2015-01-12 LAB — LIPASE, BLOOD: LIPASE: 15 U/L (ref 11–51)

## 2015-01-12 MED ORDER — PROMETHAZINE HCL 25 MG PO TABS: 25.0000 mg | ORAL_TABLET | Freq: Four times a day (QID) | ORAL | Status: DC | PRN

## 2015-01-12 MED ORDER — SODIUM CHLORIDE 0.9 % IV BOLUS (SEPSIS)
1000.0000 mL | Freq: Once | INTRAVENOUS | Status: AC
  Administered 2015-01-12: 1000 mL via INTRAVENOUS

## 2015-01-12 MED ORDER — HYDROMORPHONE HCL 1 MG/ML IJ SOLN
1.0000 mg | Freq: Once | INTRAMUSCULAR | Status: AC
  Administered 2015-01-12: 1 mg via INTRAVENOUS
  Filled 2015-01-12: qty 1

## 2015-01-12 MED ORDER — METOCLOPRAMIDE HCL 5 MG/ML IJ SOLN
10.0000 mg | Freq: Once | INTRAMUSCULAR | Status: AC
  Administered 2015-01-12: 10 mg via INTRAVENOUS
  Filled 2015-01-12: qty 2

## 2015-01-12 MED ORDER — FAMOTIDINE IN NACL 20-0.9 MG/50ML-% IV SOLN
20.0000 mg | Freq: Once | INTRAVENOUS | Status: AC
  Administered 2015-01-12: 20 mg via INTRAVENOUS
  Filled 2015-01-12: qty 50

## 2015-01-12 MED ORDER — HEPARIN SOD (PORK) LOCK FLUSH 100 UNIT/ML IV SOLN
INTRAVENOUS | Status: AC
  Administered 2015-01-12: 500 [IU]
  Filled 2015-01-12: qty 5

## 2015-01-12 MED ORDER — PROMETHAZINE HCL 25 MG/ML IJ SOLN
25.0000 mg | Freq: Once | INTRAMUSCULAR | Status: AC
  Administered 2015-01-12: 25 mg via INTRAVENOUS
  Filled 2015-01-12: qty 1

## 2015-01-12 MED ORDER — GI COCKTAIL ~~LOC~~
30.0000 mL | Freq: Once | ORAL | Status: AC
  Administered 2015-01-12: 30 mL via ORAL
  Filled 2015-01-12: qty 30

## 2015-01-12 MED ORDER — HALOPERIDOL LACTATE 5 MG/ML IJ SOLN
2.5000 mg | Freq: Once | INTRAMUSCULAR | Status: AC
  Administered 2015-01-12: 2.5 mg via INTRAVENOUS
  Filled 2015-01-12: qty 1

## 2015-01-12 NOTE — ED Notes (Signed)
Port-a-cath in left cw de-accessed.  Flushed with 5cc heplock flush.

## 2015-01-12 NOTE — ED Provider Notes (Signed)
CSN: [id number]     Arrival date & time 01/12/15  1126 History   First MD Initiated Contact with Patient 01/12/15 1227     Chief Complaint  Patient presents with  . Emesis  . Chest Pain     (Consider location/radiation/quality/duration/timing/severity/associated sxs/prior Treatment) HPI  49 year old female who presents with nausea vomiting and abdominal pain radiating into her chest. History of gastric cancer status post partial gastrectomy and gastrojejunostomy. Has had multiple ED visits for recurrent nausea vomiting and abdominal pain. Has been followed by her gastroenterologist for this, and has had a recent small bowel follow-through several days ago showing patency of jejunostomy as well as edema of the lining of her stomach that may be suggestive of inflammation versus recurrence of tumor. Plan was for endoscopy, which is scheduled for tomorrow. She has also had recent CT imaging October 18 showing evidence of colitis. States that she has had recurrence of her nausea and vomiting with epigastric abdominal pain yesterday, that is the same in nature as prior episodes. She does state that burning pain radiates from her epigastrium into her chest, and she has felt mildly short of breath with this, which is been new. Has not had any lightheadedness, syncope, fevers, chills, cough, sputum, or other upper respiratory symptoms. Denies that pain is pleuritic or that it changes with position. Denies dysuria, urinary frequency, diarrhea, melena, or hematochezia. States that she just wants her symptoms under control so that she can go to her endoscopy tomorrow. Passing gas, normal bowel movement yesterday.  Past Medical History  Diagnosis Date  . Headache(784.0) 2011    Migraines  . Esophageal reflux     On omeprazole  . Costochondritis   . Hypertension     started around 2011  . Ovarian cyst   . Stomach cancer ([place])   . Family history of cancer   . Heart murmur   . On antineoplastic  chemotherapy     5-FU and leucovorin  . Pneumonia   . Pulmonary emboli [facility])    Past Surgical History  Procedure Laterality Date  . Oophorectomy      Around 1985 left ovary removal  . Tonsillectomy      Around 1994  . Tubal ligation    . Esophagogastroduodenoscopy N/A 09/16/2013    Procedure: ESOPHAGOGASTRODUODENOSCOPY (EGD);  Surgeon: Wonda Horner, MD;  Location: [facility];  Service: Endoscopy;  Laterality: N/A;  . Laparoscopy N/A 09/20/2013    Procedure: DIAGNOSTIC LAPAROSCOPY,DISTAL GASTRECTOMY AND FEEDING GASTROJEJUNOSTOMY;  Surgeon: [name], MD;  Location: [facility];  Service: General;  Laterality: N/A;  . Portacath placement Left 11/18/2013    Procedure: INSERTION PORT-A-CATH;  Surgeon: [name], MD;  Location: [facility];  Service: General;  Laterality: Left;   Family History  Problem Relation Age of Onset  . Hypertension Mother   . Diabetes Mellitus II Mother   . Hypertension Sister   . Hypertension Brother   . Heart failure Maternal Grandmother   . Hypertension Maternal Grandfather   . Prostate cancer Paternal Uncle     living at 8  . Gastric cancer Cousin 26    pat first cousin; daughter of pat uncle living with prostate ca   . Gastric cancer Cousin     mat first cousin; daughter of mat aunt who died in 45s due to heart issues  . Prostate cancer Paternal Uncle     deceased  . Prostate cancer Paternal Uncle     deceased  . Lung cancer Paternal Uncle  smoker; deceased   Social History  Substance Use Topics  . Smoking status: Former Research scientist (life sciences)  . Smokeless tobacco: Never Used     Comment: Stopped 2004  . Alcohol Use: No     Comment: wine at dinner occasional   OB History    No data available     Review of Systems 10/14 systems reviewed and are negative other than those stated in the HPI    Allergies  Review of patient's allergies indicates no known allergies.  Home Medications   Prior to Admission medications   Medication Sig Start Date End  Date Taking? Authorizing Provider  carvedilol (COREG) 6.25 MG tablet Take 6.25 mg by mouth 2 (two) times daily with a meal.    Historical Provider, MD  cyclobenzaprine (FLEXERIL) 10 MG tablet Take 10 mg by mouth 3 (three) times daily as needed for muscle spasms.    Historical Provider, MD  enoxaparin (LOVENOX) 60 MG/0.6ML injection Inject 0.5 mLs (50 mg total) into the skin daily. Patient taking differently: Inject 50 mg into the skin every 12 (twelve) hours.  12/08/14   Curt Bears, MD  Multiple Vitamins-Minerals (MULTIVITAMIN & MINERAL PO) Take 1 tablet by mouth daily.    Historical Provider, MD  ondansetron (ZOFRAN ODT) 8 MG disintegrating tablet Take 1 tablet (8 mg total) by mouth every 8 (eight) hours as needed for nausea or vomiting. 12/25/14   Jola Schmidt, MD  promethazine (PHENERGAN) 25 MG suppository Place 1 suppository (25 mg total) rectally every 6 (six) hours as needed for nausea or vomiting. [id number]   Delora Fuel, MD  promethazine (PHENERGAN) 25 MG tablet Take 1 tablet (25 mg total) by mouth every 6 (six) hours as needed for nausea or vomiting. 12/18/14   Curt Bears, MD  promethazine (PHENERGAN) 25 MG tablet Take 1 tablet (25 mg total) by mouth every 6 (six) hours as needed for nausea or vomiting. 01/12/15   Forde Dandy, MD  traMADol (ULTRAM) 50 MG tablet Take 50 mg by mouth every 6 (six) hours.    Historical Provider, MD   BP 185/108 mmHg  Pulse 101  Temp(Src) 97.5 F (36.4 C) (Oral)  Resp 16  Ht 5' (1.524 m)  Wt 110 lb (49.896 kg)  BMI 21.48 kg/m2  SpO2 100% Physical Exam Physical Exam  Nursing note and vitals reviewed. Constitutional: Well developed, thin appearing, non-toxic, spitting into a plastic bag Head: Normocephalic and atraumatic.  Mouth/Throat: Oropharynx is clear and moist.  Neck: Normal range of motion. Neck supple.  Cardiovascular: Normal rate and regular rhythm.   Pulmonary/Chest: Effort normal and breath sounds normal.  Abdominal: Soft. No  distension. There is epigastric tenderness. There is no rebound and no guarding. No tenderness at McBurney's point. Negative Murphy's sign. Musculoskeletal: Normal range of motion.  Neurological: Alert, no facial droop, fluent speech, moves all extremities symmetrically Skin: Skin is warm and dry.  Psychiatric: Cooperative   ED Course  Procedures (including critical care time) Labs Review Labs Reviewed  COMPREHENSIVE METABOLIC PANEL - Abnormal; Notable for the following:    Glucose, Bld 135 (*)    All other components within normal limits  CBC - Abnormal; Notable for the following:    Hemoglobin 11.0 (*)    HCT 33.5 (*)    All other components within normal limits  LIPASE, BLOOD  TROPONIN I  MAGNESIUM    Imaging Review Dg Chest 2 View  01/12/2015  CLINICAL DATA:  Chest pain.  Vomiting. EXAM: CHEST  2 VIEW COMPARISON:  12/29/2014 FINDINGS: Stable left subclavian Port-A-Cath. Normal heart size. Artifact over the left lateral lung base. No obvious pneumothorax. No pleural effusion. Hyperaeration. Scarring at the right lung base. IMPRESSION: No active cardiopulmonary disease. Electronically Signed   By: Marybelle Killings M.D.   On: 01/12/2015 13:53   I have personally reviewed and evaluated these images and lab results as part of my medical decision-making.   EKG Interpretation   Date/Time:  Monday January 12 2015 11:39:14 EST Ventricular Rate:  99 PR Interval:  110 QRS Duration: 74 QT Interval:  326 QTC Calculation: 418 R Axis:   73 Text Interpretation:  Sinus rhythm with short PR Left ventricular  hypertrophy Abnormal QRS-T angle, consider primary T wave abnormality  Abnormal ECG T-waves in anterior leads more peaked than prior EKG   Otherwise, no significant change from prior  Confirmed by LIU MD, DANA  [id number]) on 01/12/2015 11:44:16 AM      MDM   Final diagnoses:  Gastritis  Non-intractable vomiting with nausea, vomiting of unspecified type  Epigastric pain    In  short, this is a 49 year old female with history of gastric cancer status post partial gastrectomy and gastrojejunostomy and recurrent nausea and vomiting who presents with nausea vomiting and abdominal pain for 1 day. Does not acutely ill-appearing on presentation, but is seen spitting into a plastic bag. Mildly tachycardic on presentation, and does appear dry. Abdomen is overall soft and non-peritoneal with epigastric abdominal tenderness. She does state that this is the same as prior episodes and has had prior imaging for this. Given that she has endoscopy tomorrow for evaluation, I don't think she needs any acute imaging today. Suspicion for acute infectious or obstructive process is low at this time. Basic blood work overall is unremarkable showing no major metabolic or electrolyte derangements. No leukocytosis either. Her chest discomfort, is likely is reflux from vomiting and epigastric pain. Her EKG is not ischemic, and troponin negative. Do not suspect underlying ACS. Chest x-ray showing no acute cardiopulmonary processes. Presentation not suggestive of PE, dissection, or other serious intrathoracic pathology. She is initially given Haldol and an anti-emetics for symptomatic control. She continues to complain of pain and says that dilaudid usually makes all her symptoms go away. Repeat antiemetics and dilaudid was administered with improvement in her symptoms. Able to tolerate PO intake. Encouraged her to see GI for endoscopy tomorrow. Strict return and follow-up instructions reviewed. She expressed understanding of all discharge instructions and felt comfortable with the plan of care.     Forde Dandy, MD 01/13/15 [phone]

## 2015-01-12 NOTE — Discharge Instructions (Signed)
Please follow-up with your gastroenterologist for endoscopy tomorrow. Return for worsening symptoms, including fever, vomiting and unable to keep down food fluids, worsening pain,or any other symptoms concerning to you.  Abdominal Pain, Adult Many things can cause abdominal pain. Usually, abdominal pain is not caused by a disease and will improve without treatment. It can often be observed and treated at home. Your health care provider will do a physical exam and possibly order blood tests and X-rays to help determine the seriousness of your pain. However, in many cases, more time must pass before a clear cause of the pain can be found. Before that point, your health care provider may not know if you need more testing or further treatment. HOME CARE INSTRUCTIONS Monitor your abdominal pain for any changes. The following actions may help to alleviate any discomfort you are experiencing:  Only take over-the-counter or prescription medicines as directed by your health care provider.  Do not take laxatives unless directed to do so by your health care provider.  Try a clear liquid diet (broth, tea, or water) as directed by your health care provider. Slowly move to a bland diet as tolerated. SEEK MEDICAL CARE IF:  You have unexplained abdominal pain.  You have abdominal pain associated with nausea or diarrhea.  You have pain when you urinate or have a bowel movement.  You experience abdominal pain that wakes you in the night.  You have abdominal pain that is worsened or improved by eating food.  You have abdominal pain that is worsened with eating fatty foods.  You have a fever. SEEK IMMEDIATE MEDICAL CARE IF:  Your pain does not go away within 2 hours.  You keep throwing up (vomiting).  Your pain is felt only in portions of the abdomen, such as the right side or the left lower portion of the abdomen.  You pass bloody or black tarry stools. MAKE SURE YOU:  Understand these  instructions.  Will watch your condition.  Will get help right away if you are not doing well or get worse.   This information is not intended to replace advice given to you by your health care provider. Make sure you discuss any questions you have with your health care provider.   Document Released: 11/17/2004 Document Revised: 10/29/2014 Document Reviewed: 10/17/2012 Elsevier Interactive Patient Education [employer].

## 2015-01-12 NOTE — ED Notes (Signed)
MD notified of pts continued complaints. MD at bedside.

## 2015-01-12 NOTE — ED Notes (Signed)
Pt requesting phenergan and dilaudid for pain and nausea.

## 2015-01-12 NOTE — ED Notes (Signed)
Vomiting and pain in her chest last night. No better this am. She was seen 2 ago for the same. She is scheduled for an endo tomorrow.

## 2015-01-12 NOTE — ED Notes (Signed)
Coke given for po challenge.

## 2015-01-13 ENCOUNTER — Encounter: Payer: Self-pay | Admitting: Genetic Counselor

## 2015-01-13 ENCOUNTER — Other Ambulatory Visit: Payer: Self-pay

## 2015-01-13 ENCOUNTER — Other Ambulatory Visit: Payer: Self-pay | Admitting: Gastroenterology

## 2015-01-19 ENCOUNTER — Other Ambulatory Visit: Payer: Self-pay | Admitting: *Deleted

## 2015-01-19 ENCOUNTER — Telehealth: Payer: Self-pay | Admitting: *Deleted

## 2015-01-19 ENCOUNTER — Telehealth: Payer: Self-pay | Admitting: Genetic Counselor

## 2015-01-19 ENCOUNTER — Telehealth: Payer: Self-pay | Admitting: Nurse Practitioner

## 2015-01-19 NOTE — Telephone Encounter (Signed)
She can come for symptom management visit by Cyndee.

## 2015-01-19 NOTE — Telephone Encounter (Signed)
[name] called back to reschedule her appointment.  She will be coming in for genetic counseling on Thursday, 12/1 at 2 PM.  She knows to come about 15-20 mins early so that she can check in.

## 2015-01-19 NOTE — Telephone Encounter (Signed)
Received call from pt wanting to know if she needed to come in for a lab check regarding her lovenox.  She was started on lovenox last mo after ED visit at [facility].  She also wants to know if Dr [name] is aware of what is going on with her.  She states she had an endoscopy & is waiting on biopsy report.  She states that she feels like she is not getting the support from [place] that she needs & is in the ED monthly with nausea/vomiting.  She also states that her port is sore & painful but denies any fever, redness, heat,or swelling.  She has noticed this over the last few days. Message to Dr [name] & RN

## 2015-01-19 NOTE — Telephone Encounter (Signed)
Called pt & informed that we would get pt in to see Selena Lesser NP.  POF to scheduler.

## 2015-01-19 NOTE — Telephone Encounter (Signed)
Aware of 11/29 apointment

## 2015-01-20 ENCOUNTER — Ambulatory Visit (HOSPITAL_BASED_OUTPATIENT_CLINIC_OR_DEPARTMENT_OTHER): Payer: Medicaid Other | Admitting: Nurse Practitioner

## 2015-01-20 ENCOUNTER — Telehealth: Payer: Self-pay | Admitting: *Deleted

## 2015-01-20 VITALS — BP 122/86 | HR 85 | Temp 99.1°F | Resp 18 | Ht 60.0 in | Wt 118.6 lb

## 2015-01-20 DIAGNOSIS — G893 Neoplasm related pain (acute) (chronic): Secondary | ICD-10-CM

## 2015-01-20 DIAGNOSIS — C169 Malignant neoplasm of stomach, unspecified: Secondary | ICD-10-CM | POA: Diagnosis not present

## 2015-01-20 DIAGNOSIS — R112 Nausea with vomiting, unspecified: Secondary | ICD-10-CM

## 2015-01-20 MED ORDER — TRAMADOL HCL 50 MG PO TABS: 50.0000 mg | ORAL_TABLET | Freq: Four times a day (QID) | ORAL | Status: DC

## 2015-01-20 MED ORDER — PROMETHAZINE HCL 25 MG PO TABS: 25.0000 mg | ORAL_TABLET | Freq: Four times a day (QID) | ORAL | Status: DC | PRN

## 2015-01-20 MED ORDER — ONDANSETRON 8 MG PO TBDP: 8.0000 mg | ORAL_TABLET | Freq: Three times a day (TID) | ORAL | Status: DC | PRN

## 2015-01-20 NOTE — Telephone Encounter (Signed)
PATIENT WAS GIVEN A GENETICS COUNSEL WITH KAYLA BOGGS ON 01-13-15 @ 10 AM, PATIENT CANCELLED APPT.

## 2015-01-21 ENCOUNTER — Encounter: Payer: Self-pay | Admitting: Nurse Practitioner

## 2015-01-21 ENCOUNTER — Telehealth: Payer: Self-pay | Admitting: Nurse Practitioner

## 2015-01-21 ENCOUNTER — Telehealth: Payer: Self-pay | Admitting: General Practice

## 2015-01-21 ENCOUNTER — Other Ambulatory Visit: Payer: Self-pay

## 2015-01-21 NOTE — Telephone Encounter (Signed)
Patient called and left message stating she is having vaginal discharge and bleeding. Patient states she shouldn't be spotting because she just had the depo shot last month. Patient would like an appt to be seen & a call back. Called patient and she states she has been spotting off and on for a while now. Discussed with patient that can be a normal side effect through the first few injections of depo. Patient verbalized understanding and asked if she could get the shot sooner. Told patient the earliest date is 12/30 which we are closed that day. Told patient she could come in on the 29th instead. Patient verbalized understanding and had no other questions

## 2015-01-21 NOTE — Telephone Encounter (Signed)
per pof cx lab appt-cld pt & left message to adv of lab appt and purpose-sent Cyndee email to adv-appt kept(lab

## 2015-01-21 NOTE — Assessment & Plan Note (Addendum)
Patient continues to suffer with chronic, intermittent nausea and vomiting.  She states that she frequently has to go to the emergency department for this problem.  She is requesting a refill of her Zofran and Phenergan today.  Pt she also states that she has recently undergone another endoscopy per her gastroenterologist; and has a follow up appointment with him next week as well.

## 2015-01-21 NOTE — Assessment & Plan Note (Signed)
Patient last received 5-FU/leucovorin chemotherapy in January 2016.  She completed radiation treatments in April 2016. Chart notes state that patient refused Xeloda oral therapy.  Patient's PEG tube was removed in October 2015.  Patient states that her left upper chest Port-A-Cath site does sometimes feel uncomfortable; but appears intact with no evidence of infection, edema, warmth, erythema, or tenderness on exam.  Patient reports that her Port-A-Cath has been drawing back and functioning properly.  Patient continues to experience intermittent, chronic nausea and vomiting which has required multiple ED visits. Patient states that she takes phenergran for her nausea/vomiting; that does seem to help. She request refills of both phenergran and zofran today.   Patient denies any abdominal discomfort or cramping.  She is reports that her bowels are working regularly.  She denies any recent fevers or chills.  Patient has a history of chronic nausea/vomiting; and has undergone 2 different upper endoscopies for the same symptoms per Dr. Penelope Coop, gastroenterologist.  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  Reviewed findings of these biopsy results with Dr. Glendell Docker then advised patient that the biopsy result was negative.   Patient was given prescription refills for both her Phenergan and Zofran per her request today.  Advised patient to follow-up with her gastroenterologist as planned for further evaluation of her chronic nausea and vomiting issues.  Patient is scheduled for labs only.  On 01/22/2015.  Will review plan with Dr. [name] regarding further follow-up and future appointments.

## 2015-01-21 NOTE — Progress Notes (Signed)
SYMPTOM MANAGEMENT CLINIC   HPI: Paula [name] 49 y.o. female diagnosed with gastric carcinoma.  Patient is status post chemotherapy and radiation therapy.  Patient is currently undergoing observation only.   Patient last received 5-FU/leucovorin chemotherapy in January 2016.  She completed radiation treatments in April 2016. Chart notes state that patient refused Xeloda oral therapy.  Patient's PEG tube was removed in October 2015.  Patient states that her left upper chest Port-A-Cath site does sometimes feel uncomfortable; but appears intact with no evidence of infection, edema, warmth, erythema, or tenderness on exam.  Patient reports that her Port-A-Cath has been drawing back and functioning properly.  Patient continues to suffer with chronic, intermittent nausea and vomiting.  She states that she frequently has to go to the emergency department for this problem.  She is requesting a refill of her Zofran and Phenergan today.  Pt she also states that she has recently undergone another endoscopy per her gastroenterologist; and has a follow up appointment with him next week as well.   HPI  ROS  Past Medical History  Diagnosis Date  . Headache(784.0) 2011    Migraines  . Esophageal reflux     On omeprazole  . Costochondritis   . Hypertension     started around 2011  . Ovarian cyst   . Stomach cancer ([place])   . Family history of cancer   . Heart murmur   . On antineoplastic chemotherapy     5-FU and leucovorin  . Pneumonia   . Pulmonary emboli [facility])     Past Surgical History  Procedure Laterality Date  . Oophorectomy      Around 1985 left ovary removal  . Tonsillectomy      Around 1994  . Tubal ligation    . Esophagogastroduodenoscopy N/A 09/16/2013    Procedure: ESOPHAGOGASTRODUODENOSCOPY (EGD);  Surgeon: Wonda Horner, MD;  Location: [facility];  Service: Endoscopy;  Laterality: N/A;  . Laparoscopy N/A 09/20/2013    Procedure: DIAGNOSTIC  LAPAROSCOPY,DISTAL GASTRECTOMY AND FEEDING GASTROJEJUNOSTOMY;  Surgeon: [name], MD;  Location: [facility];  Service: General;  Laterality: N/A;  . 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; Malnutrition of moderate degree ([place]); Back pain; Intractable nausea and vomiting; Protein-calorie malnutrition, severe ([place]); Anemia, unspecified; Pain of left upper arm; Gastric atony; Constipation, chronic; Chronic low back pain; Acute kidney injury ([place]); Palliative care encounter; Nausea & vomiting; Abdominal pain, left lower quadrant; Somatization disorder; Cancer of antrum of stomach ([place]); Family history of cancer; Weight loss; PEG tube malfunction ([place]); Dehiscence of incision; Hyponatremia; Hypokalemia; Other pancytopenia ([place]); Neuropathy due to chemotherapeutic drug ([place]); Hypoalbuminemia; Malfunction of device; Leg edema; Neoplasm related pain; Neutropenia ([place]); and Insomnia on her problem list.    has No Known Allergies.    Medication List       This list is accurate as of: 01/20/15 11:59 PM.  Always use your most recent med list.               carvedilol 6.25 MG tablet  Commonly known as:  COREG  Take 6.25 mg by mouth 2 (two) times daily with a meal.     enoxaparin 60 MG/0.6ML injection  Commonly known  as:  LOVENOX  Inject 0.5 mLs (50 mg total) into the skin daily.     MULTIVITAMIN & MINERAL PO  Take 1 tablet by mouth daily.     ondansetron 8 MG disintegrating tablet  Commonly known as:  ZOFRAN ODT  Take 1 tablet (8 mg total) by mouth every 8 (eight) hours as needed for nausea or vomiting.     promethazine 25 MG suppository    Commonly known as:  PHENERGAN  Place 1 suppository (25 mg total) rectally every 6 (six) hours as needed for nausea or vomiting.     promethazine 25 MG tablet  Commonly known as:  PHENERGAN  Take 1 tablet (25 mg total) by mouth every 6 (six) hours as needed for nausea or vomiting.     traMADol 50 MG tablet  Commonly known as:  ULTRAM  Take 1 tablet (50 mg total) by mouth every 6 (six) hours.         PHYSICAL EXAMINATION  Oncology Vitals 01/20/2015 01/12/2015  Height 152 cm -  Weight 53.797 kg -  Weight (lbs) 118 lbs 10 oz -  BMI (kg/m2) 23.16 kg/m2 -  Temp 99.1 -  Pulse 85 101  Resp 18 16  SpO2 100 100  BSA (m2) 1.51 m2 -   BP Readings from Last 2 Encounters:  01/20/15 122/86  01/12/15 185/108    Physical Exam  Constitutional: She is oriented to person, place, and time and well-developed, well-nourished, and in no distress.  HENT:  Head: Normocephalic and atraumatic.  Mouth/Throat: Oropharynx is clear and moist.  Eyes: Conjunctivae and EOM are normal. Pupils are equal, round, and reactive to light. Right eye exhibits no discharge. Left eye exhibits no discharge. No scleral icterus.  Neck: Normal range of motion. Neck supple. No JVD present. No tracheal deviation present. No thyromegaly present.  Cardiovascular: Normal rate, regular rhythm, normal heart sounds and intact distal pulses.   Pulmonary/Chest: Effort normal and breath sounds normal. No respiratory distress. She has no wheezes. She has no rales. She exhibits no tenderness.  Left upper chest Port-A-Cath site intact with no evidence of infection.  Abdominal: Soft. Bowel sounds are normal. She exhibits no distension and no mass. There is no tenderness. There is no rebound and no guarding.  Musculoskeletal: Normal range of motion. She exhibits no edema or tenderness.  Lymphadenopathy:    She has no cervical adenopathy.  Neurological: She is alert and oriented to person, place, and time. Gait normal.  Skin: Skin is  warm and dry. No rash noted. No erythema. No pallor.  Psychiatric: Affect normal.  Nursing note and vitals reviewed.   LABORATORY DATA:. No visits with results within 3 Day(s) from this visit. Latest known visit with results is:  Admission on 01/12/2015, Discharged on 01/12/2015  Component Date Value Ref Range Status  . Lipase 01/12/2015 15  11 - 51 U/L Final  . Sodium 01/12/2015 137  135 - 145 mmol/L Final  . Potassium 01/12/2015 3.7  3.5 - 5.1 mmol/L Final  . Chloride 01/12/2015 105  101 - 111 mmol/L Final  . CO2 01/12/2015 22  22 - 32 mmol/L Final  . Glucose, Bld 01/12/2015 135* 65 - 99 mg/dL Final  . BUN 01/12/2015 8  6 - 20 mg/dL Final  . Creatinine, Ser 01/12/2015 0.72  0.44 - 1.00 mg/dL Final  . Calcium 01/12/2015 9.4  8.9 - 10.3 mg/dL Final  . Total Protein 01/12/2015 7.8  6.5 - 8.1 g/dL Final  . Albumin 01/12/2015  4.3  3.5 - 5.0 g/dL Final  . AST 01/12/2015 22  15 - 41 U/L Final  . ALT 01/12/2015 20  14 - 54 U/L Final  . Alkaline Phosphatase 01/12/2015 88  38 - 126 U/L Final  . Total Bilirubin 01/12/2015 0.6  0.3 - 1.2 mg/dL Final  . GFR calc non Af Amer 01/12/2015 >60  >60 mL/min Final  . GFR calc Af Amer 01/12/2015 >60  >60 mL/min Final   Comment: (NOTE) The eGFR has been calculated using the CKD EPI equation. This calculation has not been validated in all clinical situations. eGFR's persistently <60 mL/min signify possible Chronic Kidney Disease.   . Anion gap 01/12/2015 10  5 - 15 Final  . WBC 01/12/2015 4.3  4.0 - 10.5 K/uL Final  . RBC 01/12/2015 3.94  3.87 - 5.11 MIL/uL Final  . Hemoglobin 01/12/2015 11.0* 12.0 - 15.0 g/dL Final  . HCT 01/12/2015 33.5* 36.0 - 46.0 % Final  . MCV 01/12/2015 85.0  78.0 - 100.0 fL Final  . MCH 01/12/2015 27.9  26.0 - 34.0 pg Final  . MCHC 01/12/2015 32.8  30.0 - 36.0 g/dL Final  . RDW 01/12/2015 14.5  11.5 - 15.5 % Final  . Platelets 01/12/2015 310  150 - 400 K/uL Final  . Troponin I 01/12/2015 <0.03  <0.031 ng/mL Final    Comment:        NO INDICATION OF MYOCARDIAL INJURY.   . Magnesium 01/12/2015 1.7  1.7 - 2.4 mg/dL Final     RADIOGRAPHIC STUDIES: No results found.  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  She completed radiation treatments in April 2016. Chart notes state that patient refused Xeloda oral therapy.  Patient's PEG tube was removed in October 2015.  Patient states that her left upper chest Port-A-Cath site does sometimes feel uncomfortable; but appears intact with no evidence of infection, edema, warmth, erythema, or tenderness on exam.  Patient reports that her Port-A-Cath has been drawing back and functioning properly.  Patient continues to experience intermittent, chronic nausea and vomiting which has required multiple ED visits. Patient states that she takes phenergran for her nausea/vomiting; that does seem to help. She request refills of both phenergran and zofran today.   Patient denies any abdominal discomfort or cramping.  She is reports that her bowels are working regularly.  She denies any recent fevers or chills.  Patient has a history of chronic nausea/vomiting; and has undergone 2 different upper endoscopies for the same symptoms per Dr. Penelope Coop, gastroenterologist.  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  Reviewed findings of these biopsy results with Dr. Glendell Docker then advised patient that the biopsy result was negative.   Patient was given prescription refills for both her Phenergan and Zofran per her request today.  Advised patient to follow-up with her gastroenterologist as planned for further evaluation of her chronic nausea and vomiting issues.  Patient is scheduled for labs only.  On 01/22/2015.  Will review plan with Dr. [name] regarding  further follow-up and future appointments.  Nausea & vomiting Patient continues to suffer with chronic, intermittent nausea and vomiting.  She states that she frequently has to go to the emergency department for this problem.  She is requesting a refill of her Zofran and Phenergan today.  Pt she also states that  she has recently undergone another endoscopy per her gastroenterologist; and has a follow up appointment with him next week as well.  Neoplasm related pain Patient also suffers with some chronic pain; is requesting a refill of her tramadol today.   Patient stated understanding of all instructions; and was in agreement with this plan of care. The patient knows to call the clinic with any problems, questions or concerns.   Review/collaboration with Dr. [name] regarding all aspects of patient's visit today.   Total time spent with patient was 25 minutes;  with greater than 75 percent of that time spent in face to face counseling regarding patient's symptoms,  and coordination of care and follow up.  Disclaimer:This dictation was prepared with Dragon/digital dictation along with [employer]. Any transcriptional errors that result from this process are unintentional.  Drue Second, NP 01/21/2015

## 2015-01-21 NOTE — Assessment & Plan Note (Signed)
Patient also suffers with some chronic pain; is requesting a refill of her tramadol today.

## 2015-01-22 ENCOUNTER — Other Ambulatory Visit: Payer: Medicaid Other

## 2015-01-22 ENCOUNTER — Ambulatory Visit (HOSPITAL_BASED_OUTPATIENT_CLINIC_OR_DEPARTMENT_OTHER): Payer: Medicaid Other | Admitting: Genetic Counselor

## 2015-01-22 DIAGNOSIS — Z803 Family history of malignant neoplasm of breast: Secondary | ICD-10-CM | POA: Diagnosis not present

## 2015-01-22 DIAGNOSIS — Z1379 Encounter for other screening for genetic and chromosomal anomalies: Secondary | ICD-10-CM

## 2015-01-22 DIAGNOSIS — C169 Malignant neoplasm of stomach, unspecified: Secondary | ICD-10-CM

## 2015-01-22 DIAGNOSIS — Z801 Family history of malignant neoplasm of trachea, bronchus and lung: Secondary | ICD-10-CM | POA: Diagnosis not present

## 2015-01-22 DIAGNOSIS — Z800 Family history of malignant neoplasm of digestive organs: Secondary | ICD-10-CM

## 2015-01-22 DIAGNOSIS — C163 Malignant neoplasm of pyloric antrum: Secondary | ICD-10-CM

## 2015-01-22 DIAGNOSIS — Z315 Encounter for genetic counseling: Secondary | ICD-10-CM

## 2015-01-22 DIAGNOSIS — Z809 Family history of malignant neoplasm, unspecified: Secondary | ICD-10-CM

## 2015-01-23 ENCOUNTER — Encounter: Payer: Self-pay | Admitting: Genetic Counselor

## 2015-01-23 ENCOUNTER — Telehealth: Payer: Self-pay | Admitting: Genetic Counselor

## 2015-01-23 DIAGNOSIS — Z1379 Encounter for other screening for genetic and chromosomal anomalies: Secondary | ICD-10-CM | POA: Insufficient documentation

## 2015-01-23 NOTE — Telephone Encounter (Signed)
Called to discuss Paula [name]'s previous genetic test results with her.  Because the test she had was comprehensive and there have been no updates since 2015, we discussed that we do not need to send a sample for further genetic testing.  Additionally, insurance would probably not cover this.  Discussed the VUS on her previous result and that we just treat this like a negative result until it gets reclassified by the lab.  Encouraged her to keep her contact info up to date, so we can let her know if/when that happens.  Encouraged her to inform her paternal first cousin who had stomach cancer and her maternal first cousin who had early-onset breast cancer that they are eligible for genetic counseling and testing.  I will send Paula [name] a copy of her previous test results and a letter discussing this information.  She is welcome to call or email me with further questions.  She should continue to follow the cancer screening guidelines provided by her oncology and primary care providers.

## 2015-01-23 NOTE — Progress Notes (Signed)
REFERRING PROVIDER: Eppie Gibson, MD   OTHER PROVIDER(S): Curt Bears, MD  PRIMARY PROVIDER:  Arnoldo Morale, MD  PRIMARY REASON FOR VISIT:  1. Genetic testing   2. Gastric carcinoma s/p Distal gastrectomy/GJ ([place]roth II) 09/20/2013   3. Family history of cancer      HISTORY OF PRESENT ILLNESS:   [name], a 49 y.o. female, was seen for a [facility] cancer genetics consultation at the request of Dr. Isidore Moos due to a personal history of gastric cancer at 18 and family history of gastric and other cancers.  [name] presents to clinic today to discuss the possibility of a hereditary predisposition to cancer, genetic testing, and to further clarify her future cancer risks, as well as potential cancer risks for family members.   In July 2015, at the age of 50, 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 This was treated with partial gastrectomy, chemotherapy, and radiation therapy.    [name] was previously seen at [facility] on November 14, 2013.  At that time she underwent blood draw for genetic testing via [facility].  Her results were reported on December 14, 2013 and were negative for pathogenic mutations within any of these 17 genes.  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  This has not been updated by the lab, as of January 23, 2015.  We treat this just like a negative result in the meantime.  She was counseled regarding these results at the time, but she did not recall that she had had genetic counseling and testing.  Please refer to Ofri Leitner's clinical genetic counseling notes from November 15, 2014 and December 17, 2013, as well as the scanned [facility] lab report from December 18, 2013 (under the Media tab) for more information.   CANCER  HISTORY:   No history exists.     HORMONAL RISK FACTORS:  Menarche was at age 39.  First live birth at age 7.  OCP use for approximately 5 months.  Ovaries intact: left ovary and fallopian tube was removed in 1985 following ectopic pregnancy; still has right ovary and fallopian tube intact.  Hysterectomy: no.  Menopausal status: unsure, periods stopped while undergoing chemotherapy.  HRT use: 0 years. Colonoscopy: no; not examined. Mammogram within the last year: has every 2 years; fibrocystic findings in the past. Number of breast biopsies: 0. Up to date with pelvic exams:  Due this month. Any excessive radiation exposure in the past:  no  Past Medical History  Diagnosis Date  . Headache(784.0) 2011    Migraines  . Esophageal reflux     On omeprazole  . Costochondritis   . Hypertension     started around 2011  . Ovarian cyst   . Stomach cancer ([place])   . Family history of cancer   . Heart murmur   . On antineoplastic chemotherapy     5-FU and leucovorin  . Pneumonia   . Pulmonary emboli [facility])     Past Surgical History  Procedure Laterality Date  . Oophorectomy      Around 1985 left ovary removal  . Tonsillectomy      Around 1994  . Tubal ligation    . Esophagogastroduodenoscopy N/A 09/16/2013    Procedure: ESOPHAGOGASTRODUODENOSCOPY (EGD);  Surgeon: Wonda Horner, MD;  Location: [facility];  Service: Endoscopy;  Laterality: N/A;  . Laparoscopy N/A 09/20/2013    Procedure: DIAGNOSTIC LAPAROSCOPY,DISTAL GASTRECTOMY AND FEEDING GASTROJEJUNOSTOMY;  Surgeon: [name], MD;  Location: [facility];  Service: General;  Laterality: N/A;  . Portacath placement Left 11/18/2013    Procedure: INSERTION PORT-A-CATH;  Surgeon: [name], MD;  Location: [facility];  Service: General;  Laterality: Left;    Social History   Social History  . Marital Status: Married    Spouse Name: N/A  . Number of Children: 2  . Years of Education: N/A   Occupational History  .  [place]   Social History Main Topics  . Smoking status: Former Smoker -- 1.00 packs/day for 16 years    Quit date: 02/21/2002  . Smokeless tobacco: Never Used     Comment: 0.5-1.0 ppd for 16 yrs  . Alcohol Use: No     Comment: wine at dinner previously  . Drug Use: No  . Sexual Activity: Not Asked   Other Topics Concern  . None   Social History Narrative     FAMILY HISTORY:  We obtained a detailed, 4-generation family history.  Significant diagnoses are listed below: Family History  Problem Relation Age of Onset  . Hypertension Mother   . Diabetes Mellitus II Mother   . Other Mother     hx of hysterectomy for unspecified reason  . Hypertension Sister   . Hypertension Brother   . Heart failure Maternal Grandmother   . Heart Problems Maternal Grandmother   . Hypertension Maternal Grandfather   . Diabetes Maternal Grandfather   . Colon cancer Paternal Uncle     dx. late 50s-early 60s  . Gastric cancer Cousin 59  . Colon cancer Cousin     dx. early-late 45s; (father also had colon cancer)  . Colon cancer Paternal Uncle     dx. late 50s-early 60s  . Lung cancer Paternal Uncle     dx. 80s; smoker  . Colon polyps Paternal Uncle     unknown number  . Heart attack Maternal Aunt   . Heart Problems Paternal Aunt   . Heart attack Paternal Grandfather   . Colon cancer Paternal Grandfather     dx. 50s-60s  . Colon polyps Brother     1 maternal half-brother had approx 3 polyps  . Breast cancer Cousin 17  . Breast cancer Cousin     dx. early 24s or earlier (father had colon cancer)  . Lung cancer Other     PGF's brothers (x4); dx. later in life; lim info    [name] has two sons, ages 7 and 15, who have never had cancer.  She currently has seven grandchildren.  She has one full sister and one full brother.  Her sister is currently 54 and has never had cancer; her brother died in a motorcycle accident at the age of 69.  She also has one maternal half-sister and  four maternal half-brothers--all between the ages of 22 and 36 and cancer-free.  One half-brother has a history of approximately 3 colon polyps.  [name] mother is currently 36 and has never had cancer or any colon polyps.  She does have a history of a hysterectomy for an unspecified reason.  [name] father passed away at the age of 72.  A car fell on him, while he was working underneath it.  [name] mother has four full brothers and three full sisters.  One of these maternal aunts died of a heart attack  at 71.  She had one daughter, Kenney Houseman, who was diagnosed with breast cancer at the age of 12.  [name] is unsure as to whether this cousin has had genetic testing.  The remaining maternal aunts and uncles are currently in their 92s and have never had cancer.  No additional maternal first cousins have been diagnosed with cancer.  [name] maternal grandmother died with heart problems at the age of 73.  Her maternal grandfather died of diabetes-related illness at the age of 21.  She has no further information regarding any maternal great aunts/uncles or great grandparents.  [name] father had four full sisters and seven full brothers.  Two brothers were diagnosed with colon cancer in their late 40s-early 60s and have since passed away.  One of these brothers had one son who was also diagnosed with colon cancer and passed away in his early-late 1s.  The other paternal uncle with colon cancer had multiple children, at least daughter Sunday Spillers) was diagnosed with breast cancer and passed away in her early 11s.  Another paternal uncle was a smoker and had a history of lung cancer diagnosed in his 66s.  One paternal aunt died of heart problems a the age of 17.  Another paternal uncle has a history of colon polyps (unspecified number).  He had one daughter, who was diagnosed with stomach cancer at 66.  [name] is unsure whether this first cousin has  had genetic testing.  The remaining six aunts/uncles are in their 60s-70s and have never had cancer.  [name]'s paternal grandmother died during childbirth in her late 77s-early 25s.  Her paternal grandfather died of a heart attack at 6.  He had a history of colon cancer in his 50s-60s.  At least four of this grandfather's brothers were diagnosed with lung cancer at later ages in life.  [name] has no further information for her paternal relatives.    Patient's maternal and paternal ancestors are of African American descent. There is no reported Ashkenazi Jewish ancestry. There is no known consanguinity.  GENETIC COUNSELING ASSESSMENT: Tynika A Smith-Golden is a 49 y.o. female with a personal and family history of cancer which is somewhat suggestive of a hereditary cancer syndrome and predisposition to cancer. We, therefore, discussed and recommended the following at today's visit.   DISCUSSION: We reviewed the characteristics, features and inheritance patterns of hereditary cancer syndromes. We also discussed genetic testing, including the appropriate family members to test.  We discussed the implications of a negative, positive and/or variant of uncertain significant result.  Because [name] had genetic panel testing so recently (September/October of 2015), we did not recommend any further genetic testing for her at this time.  We discussed that there are larger genetic testing panels available, but that it is likely that insurance will not cover a second test for her at this time.  Furthermore, her previous genetic testing included all of the major gastric and colorectal cancer-causing genes that we would be most concerned about potentially finding a mutation within.  Her test result was negative, but one VUS, "c.-954C>G" was found in one copy of the PTEN gene.   [facility] was contacted to obtain an update on the status of this VUS.  At this time, Althia Forts is still  calling this a VUS (uncertain) result, and this VUS currently has no presence in the [employer].  Thus, at this time we treat this just like a negative result.  We encouraged Ms.  Smith-Golden to continue to keep her contact information up-to-date with [place] (so that we can inform her if/when the lab reclassifies her VUS result), as well as to continue to provide [place] with updates to the family history.  She is also welcome to check back with [place] in the future to obtain updates on her VUS result.    We are still suspicious of the personal and family history of gastric and colon cancers.  We discussed that further familial genetic counseling and testing could give [place] helpful information regarding [name]'s own genetic cancer risks.  All of [name]'s relatives who have had colon cancer have passed away.  However, we recommended that her paternal first cousin who was diagnosed with gastric cancer at the age of 51 have genetic counseling and testing.  We also recommended that her maternal first cousin who had breast cancer at 84 have genetic counseling and testing.  We are happy to facilitate this process for family members in and around [place].  For those relatives located in more distant areas, they can use the [employer] ([email or web address]) to locate cancer genetic counselors in their local area.    [name] should continue to follow the cancer screening recommendations of her primary and oncology providers.  Her family members are considered to be at an increased risk for cancer based on the cancer history.  We recommend that family members begin colonoscopy screening at the age of 98 or 13 years younger than the earliest diagnosis of colon cancer in the immediate family.  Likewise we recommend that annual mammogram screening for women in the family begin at the age of 75 or 67 years younger than the earliest diagnosis of breast cancer in immediate family.   Women are also recommended to perform regular self-breast exams, have annual clinical breast exams from their doctors, as well as routine gynecological exams.    PLAN: Because [name] had updated, comprehensive genetic panel testing in 2015 that was negative, we do not recommend further genetic testing at this time.  We encourage her to reach out to the aforementioned family members regarding their own genetic counseling and testing.  We also encourage her to keep checking in with [place] in the future regarding her VUS test result and also to update the family history of cancer at that time.  We encouraged [name] to remain in contact with cancer genetics annually so that we can continuously update the family history and inform her of any changes in cancer genetics and testing that may be of benefit for her family. [name] questions were answered to her satisfaction today. Our contact information was provided should additional questions or concerns arise.  [name] would like a copy of her previous genetic testing results and a letter discussing the information provided, and we are happy to do that.  Thank you for the referral and allowing [place] to share in the care of your patient.   Jeanine Luz, MS Genetic Counselor [email or web address] Phone: [phone]  The patient was seen for a total of 60 minutes in face-to-face genetic counseling.  This patient was discussed with Drs. Magrinat, Lindi Adie and/or Burr Medico who agrees with the above.    _______________________________________________________________________ For Office Staff:  Number of people involved in session: 1 Was an Intern/ student involved with case: no

## 2015-01-26 ENCOUNTER — Encounter (HOSPITAL_BASED_OUTPATIENT_CLINIC_OR_DEPARTMENT_OTHER): Payer: Self-pay | Admitting: *Deleted

## 2015-01-26 ENCOUNTER — Emergency Department (HOSPITAL_BASED_OUTPATIENT_CLINIC_OR_DEPARTMENT_OTHER)
Admission: EM | Admit: 2015-01-26 | Discharge: 2015-01-26 | Disposition: A | Discharge status: Home / Self Care | Payer: Medicaid Other | Attending: Emergency Medicine | Admitting: Emergency Medicine

## 2015-01-26 DIAGNOSIS — Z7901 Long term (current) use of anticoagulants: Secondary | ICD-10-CM | POA: Insufficient documentation

## 2015-01-26 DIAGNOSIS — Z79899 Other long term (current) drug therapy: Secondary | ICD-10-CM | POA: Diagnosis not present

## 2015-01-26 DIAGNOSIS — R112 Nausea with vomiting, unspecified: Secondary | ICD-10-CM | POA: Diagnosis present

## 2015-01-26 DIAGNOSIS — Z8739 Personal history of other diseases of the musculoskeletal system and connective tissue: Secondary | ICD-10-CM | POA: Insufficient documentation

## 2015-01-26 DIAGNOSIS — K219 Gastro-esophageal reflux disease without esophagitis: Secondary | ICD-10-CM | POA: Diagnosis not present

## 2015-01-26 DIAGNOSIS — R079 Chest pain, unspecified: Secondary | ICD-10-CM | POA: Diagnosis not present

## 2015-01-26 DIAGNOSIS — Z8701 Personal history of pneumonia (recurrent): Secondary | ICD-10-CM | POA: Insufficient documentation

## 2015-01-26 DIAGNOSIS — Z8742 Personal history of other diseases of the female genital tract: Secondary | ICD-10-CM | POA: Insufficient documentation

## 2015-01-26 DIAGNOSIS — G43A0 Cyclical vomiting, not intractable: Secondary | ICD-10-CM | POA: Insufficient documentation

## 2015-01-26 DIAGNOSIS — I10 Essential (primary) hypertension: Secondary | ICD-10-CM | POA: Diagnosis not present

## 2015-01-26 DIAGNOSIS — Z85028 Personal history of other malignant neoplasm of stomach: Secondary | ICD-10-CM | POA: Insufficient documentation

## 2015-01-26 DIAGNOSIS — R011 Cardiac murmur, unspecified: Secondary | ICD-10-CM | POA: Insufficient documentation

## 2015-01-26 DIAGNOSIS — Z86711 Personal history of pulmonary embolism: Secondary | ICD-10-CM | POA: Diagnosis not present

## 2015-01-26 DIAGNOSIS — Z903 Acquired absence of stomach [part of]: Secondary | ICD-10-CM | POA: Diagnosis not present

## 2015-01-26 DIAGNOSIS — Z9221 Personal history of antineoplastic chemotherapy: Secondary | ICD-10-CM | POA: Insufficient documentation

## 2015-01-26 DIAGNOSIS — R1115 Cyclical vomiting syndrome unrelated to migraine: Secondary | ICD-10-CM

## 2015-01-26 DIAGNOSIS — Z87891 Personal history of nicotine dependence: Secondary | ICD-10-CM | POA: Diagnosis not present

## 2015-01-26 LAB — CBC WITH DIFFERENTIAL/PLATELET
Basophils Absolute: 0 10*3/uL (ref 0.0–0.1)
Basophils Relative: 1 %
EOS PCT: 0 %
Eosinophils Absolute: 0 10*3/uL (ref 0.0–0.7)
HCT: 35.7 % — ABNORMAL LOW (ref 36.0–46.0)
Hemoglobin: 11.6 g/dL — ABNORMAL LOW (ref 12.0–15.0)
LYMPHS ABS: 0.5 10*3/uL — AB (ref 0.7–4.0)
LYMPHS PCT: 13 %
MCH: 27.7 pg (ref 26.0–34.0)
MCHC: 32.5 g/dL (ref 30.0–36.0)
MCV: 85.2 fL (ref 78.0–100.0)
MONO ABS: 0.3 10*3/uL (ref 0.1–1.0)
Monocytes Relative: 7 %
Neutro Abs: 2.9 10*3/uL (ref 1.7–7.7)
Neutrophils Relative %: 79 %
Platelets: 398 10*3/uL (ref 150–400)
RBC: 4.19 MIL/uL (ref 3.87–5.11)
RDW: 14.2 % (ref 11.5–15.5)
WBC: 3.7 10*3/uL — ABNORMAL LOW (ref 4.0–10.5)

## 2015-01-26 LAB — COMPREHENSIVE METABOLIC PANEL
ALT: 23 U/L (ref 14–54)
AST: 24 U/L (ref 15–41)
Albumin: 4.5 g/dL (ref 3.5–5.0)
Alkaline Phosphatase: 108 U/L (ref 38–126)
Anion gap: 8 (ref 5–15)
BILIRUBIN TOTAL: 0.6 mg/dL (ref 0.3–1.2)
BUN: 5 mg/dL — ABNORMAL LOW (ref 6–20)
CALCIUM: 9.7 mg/dL (ref 8.9–10.3)
CHLORIDE: 105 mmol/L (ref 101–111)
CO2: 25 mmol/L (ref 22–32)
CREATININE: 0.74 mg/dL (ref 0.44–1.00)
Glucose, Bld: 136 mg/dL — ABNORMAL HIGH (ref 65–99)
Potassium: 4.2 mmol/L (ref 3.5–5.1)
Sodium: 138 mmol/L (ref 135–145)
TOTAL PROTEIN: 8.1 g/dL (ref 6.5–8.1)

## 2015-01-26 LAB — LIPASE, BLOOD: LIPASE: 16 U/L (ref 11–51)

## 2015-01-26 MED ORDER — SODIUM CHLORIDE 0.9 % IV BOLUS (SEPSIS)
1000.0000 mL | Freq: Once | INTRAVENOUS | Status: AC
  Administered 2015-01-26: 1000 mL via INTRAVENOUS

## 2015-01-26 MED ORDER — ONDANSETRON HCL 4 MG/2ML IJ SOLN
4.0000 mg | Freq: Once | INTRAMUSCULAR | Status: AC
  Administered 2015-01-26: 4 mg via INTRAVENOUS

## 2015-01-26 MED ORDER — HYDROMORPHONE HCL 1 MG/ML IJ SOLN
1.0000 mg | Freq: Once | INTRAMUSCULAR | Status: AC
  Administered 2015-01-26: 1 mg via INTRAVENOUS
  Filled 2015-01-26: qty 1

## 2015-01-26 MED ORDER — PROMETHAZINE-DM 6.25-15 MG/5ML PO SYRP: 5.0000 mL | ORAL_SOLUTION | Freq: Four times a day (QID) | ORAL | Status: DC | PRN

## 2015-01-26 MED ORDER — GUAIFENESIN 100 MG/5ML PO LIQD: 100.0000 mg | ORAL | Status: DC | PRN

## 2015-01-26 MED ORDER — PROMETHAZINE HCL 25 MG/ML IJ SOLN
12.5000 mg | Freq: Once | INTRAMUSCULAR | Status: AC
  Administered 2015-01-26: 12.5 mg via INTRAVENOUS
  Filled 2015-01-26: qty 1

## 2015-01-26 MED ORDER — ONDANSETRON HCL 4 MG/2ML IJ SOLN
INTRAMUSCULAR | Status: AC
  Administered 2015-01-26: 4 mg via INTRAVENOUS
  Filled 2015-01-26: qty 2

## 2015-01-26 MED ORDER — PROMETHAZINE HCL 25 MG PO TABS: 25.0000 mg | ORAL_TABLET | Freq: Four times a day (QID) | ORAL | Status: DC | PRN

## 2015-01-26 NOTE — ED Provider Notes (Signed)
CSN: [id number]     Arrival date & time 01/26/15  1339 History   First MD Initiated Contact with Patient 01/26/15 1347     Chief Complaint  Patient presents with  . Chest Pain     (Consider location/radiation/quality/duration/timing/severity/associated sxs/prior Treatment) HPI   49 year old female with hx of gastric cancer s/p partial gastrectomy and gastrojejunostomy presents c/o n/v.  Pt report she has had recurrent abd/back/chest pain monthly with nausea and vomiting which she related to her stomach cancer.  Her sxs started since last night. Pain is described as pressure sensation, worsening with n/v.  Vomitus is NBNB.  She c/o burning pain from epigastric to chest.  She reportedly had an endoscopy on Nov 22nd which shows a "gastric pouch".  She mentioned that dilaudid and phenergan usually helps with her condition.  Pt denies fever, chills, productive cough, URI sxs, dysuria, hematochezia or melena.  Pt has been seen in ER multiple times in the past for same.  Last chemo/radiation was in Feb according to pt.    Past Medical History  Diagnosis Date  . Headache(784.0) 2011    Migraines  . Esophageal reflux     On omeprazole  . Costochondritis   . Hypertension     started around 2011  . Ovarian cyst   . Stomach cancer ([place])   . Family history of cancer   . Heart murmur   . On antineoplastic chemotherapy     5-FU and leucovorin  . Pneumonia   . Pulmonary emboli [facility])    Past Surgical History  Procedure Laterality Date  . Oophorectomy      Around 1985 left ovary removal  . Tonsillectomy      Around 1994  . Tubal ligation    . Esophagogastroduodenoscopy N/A 09/16/2013    Procedure: ESOPHAGOGASTRODUODENOSCOPY (EGD);  Surgeon: Wonda Horner, MD;  Location: [facility];  Service: Endoscopy;  Laterality: N/A;  . Laparoscopy N/A 09/20/2013    Procedure: DIAGNOSTIC LAPAROSCOPY,DISTAL GASTRECTOMY AND FEEDING GASTROJEJUNOSTOMY;  Surgeon: [name], MD;  Location[facility];  Service:  General;  Laterality: N/A;  . Portacath placement Left 11/18/2013    Procedure: INSERTION PORT-A-CATH;  Surgeon: [name], MD;  [facility];  Service: General;  Laterality: Left;   Family History  Problem Relation Age of Onset  . Hypertension Mother   . Diabetes Mellitus II Mother   . Other Mother     hx of hysterectomy for unspecified reason  . Hypertension Sister   . Hypertension Brother   . Heart failure Maternal Grandmother   . Heart Problems Maternal Grandmother   . Hypertension Maternal Grandfather   . Diabetes Maternal Grandfather   . Colon cancer Paternal Uncle     dx. late 50s-early 60s  . Gastric cancer Cousin 57  . Colon cancer Cousin     dx. early-late 21s; (father also had colon cancer)  . Colon cancer Paternal Uncle     dx. late 50s-early 60s  . Lung cancer Paternal Uncle     dx. 64s; smoker  . Colon polyps Paternal Uncle     unknown number  . Heart attack Maternal Aunt   . Heart Problems Paternal Aunt   . Heart attack Paternal Grandfather   . Colon cancer Paternal Grandfather     dx. 50s-60s  . Colon polyps Brother     1 maternal half-brother had approx 3 polyps  . Breast cancer Cousin 41  . Breast cancer Cousin     dx.  early 45s or earlier (father had colon cancer)  . Lung cancer Other     PGF's brothers (x4); dx. later in life; lim info   Social History  Substance Use Topics  . Smoking status: Former Smoker -- 1.00 packs/day for 16 years    Quit date: 02/21/2002  . Smokeless tobacco: Never Used     Comment: 0.5-1.0 ppd for 16 yrs  . Alcohol Use: No     Comment: wine at dinner previously   OB History    No data available     Review of Systems  All other systems reviewed and are negative.     Allergies  Review of patient's allergies indicates no known allergies.  Home Medications   Prior to Admission medications   Medication Sig Start Date End Date Taking? Authorizing Provider  carvedilol (COREG) 6.25 MG tablet Take 6.25 mg by  mouth 2 (two) times daily with a meal.    Historical Provider, MD  enoxaparin (LOVENOX) 60 MG/0.6ML injection Inject 0.5 mLs (50 mg total) into the skin daily. Patient taking differently: Inject 50 mg into the skin every 12 (twelve) hours.  12/08/14   Curt Bears, MD  Multiple Vitamins-Minerals (MULTIVITAMIN & MINERAL PO) Take 1 tablet by mouth daily.    Historical Provider, MD  ondansetron (ZOFRAN ODT) 8 MG disintegrating tablet Take 1 tablet (8 mg total) by mouth every 8 (eight) hours as needed for nausea or vomiting. 01/20/15   Susanne Borders, NP  promethazine (PHENERGAN) 25 MG suppository Place 1 suppository (25 mg total) rectally every 6 (six) hours as needed for nausea or vomiting. Patient not taking: Reported on 01/20/2015 [id number]   Delora Fuel, MD  promethazine (PHENERGAN) 25 MG tablet Take 1 tablet (25 mg total) by mouth every 6 (six) hours as needed for nausea or vomiting. 01/20/15   Susanne Borders, NP  traMADol (ULTRAM) 50 MG tablet Take 1 tablet (50 mg total) by mouth every 6 (six) hours. 01/20/15   Susanne Borders, NP   BP 182/106 mmHg  Pulse 106  Temp(Src) 98.6 F (37 C) (Oral)  Resp 18  Wt 53.524 kg  SpO2 100% Physical Exam  Constitutional: She appears well-developed and well-nourished. No distress.  Frail appearing AAF, actively vomiting.  HENT:  Head: Atraumatic.  Eyes: Conjunctivae are normal.  Neck: Neck supple.  Cardiovascular: Normal rate and regular rhythm.   Pulmonary/Chest: Effort normal and breath sounds normal.  Abdominal: Soft. There is tenderness (diffused abd tenderness, no guarding or rebound tenderness).  Neurological: She is alert.  Skin: No rash noted.  Psychiatric: She has a normal mood and affect.  Nursing note and vitals reviewed.   ED Course  Procedures (including critical care time) Labs Review Labs Reviewed  CBC WITH DIFFERENTIAL/PLATELET - Abnormal; Notable for the following:    WBC 3.7 (*)    Hemoglobin 11.6 (*)    HCT 35.7 (*)     Lymphs Abs 0.5 (*)    All other components within normal limits  COMPREHENSIVE METABOLIC PANEL - Abnormal; Notable for the following:    Glucose, Bld 136 (*)    BUN 5 (*)    All other components within normal limits  LIPASE, BLOOD    Imaging Review No results found. I have personally reviewed and evaluated these images and lab results as part of my medical decision-making.   EKG Interpretation   Date/Time:  Monday January 26 2015 13:47:13 EST Ventricular Rate:  104 PR Interval:  128 QRS Duration: 66  QT Interval:  328 QTC Calculation: 431 R Axis:   83 Text Interpretation:  Sinus tachycardia Right atrial enlargement Moderate  voltage criteria for LVH, may be normal variant Nonspecific T wave  abnormality No significant change since last tracing Confirmed by Maryan Rued   MD, Loree Fee ([id number]) on 01/26/2015 1:59:51 PM      MDM   Final diagnoses:  Non-intractable cyclical vomiting with nausea    BP 138/98 mmHg  Pulse 91  Temp(Src) 98.6 F (37 C) (Oral)  Resp 18  Wt 53.524 kg  SpO2 100%   Recurrent N/V abd/chest/back pain.  Similar to prior.  Will treat sxs.   3:35 PM Labs are reassuring.  Pt has a f/u appointment with her GI specialist Dr. Penelope Coop tomorrow.  She requested for an additional dose of pain medication and phenergan prior to discharge.      Domenic Moras, PA-C 01/26/15 [place], MD 01/27/15 2025

## 2015-01-26 NOTE — ED Notes (Signed)
Chest pain into her back. Right shoulder pain. Vomiting.

## 2015-01-26 NOTE — ED Notes (Signed)
Pt up and ambulating to the bathroom.  Appears in less pain.

## 2015-01-26 NOTE — ED Notes (Signed)
Pt provided with PO challenge.  Ok'd by PA

## 2015-01-26 NOTE — Discharge Instructions (Signed)
Follow up with dr. Penelope Coop tomorrow for further care.  Nausea and Vomiting Nausea is a sick feeling that often comes before throwing up (vomiting). Vomiting is a reflex where stomach contents come out of your mouth. Vomiting can cause severe loss of body fluids (dehydration). Children and elderly adults can become dehydrated quickly, especially if they also have diarrhea. Nausea and vomiting are symptoms of a condition or disease. It is important to find the cause of your symptoms. CAUSES   Direct irritation of the stomach lining. This irritation can result from increased acid production (gastroesophageal reflux disease), infection, food poisoning, taking certain medicines (such as nonsteroidal anti-inflammatory drugs), alcohol use, or tobacco use.  Signals from the brain.These signals could be caused by a headache, heat exposure, an inner ear disturbance, increased pressure in the brain from injury, infection, a tumor, or a concussion, pain, emotional stimulus, or metabolic problems.  An obstruction in the gastrointestinal tract (bowel obstruction).  Illnesses such as diabetes, hepatitis, gallbladder problems, appendicitis, kidney problems, cancer, sepsis, atypical symptoms of a heart attack, or eating disorders.  Medical treatments such as chemotherapy and radiation.  Receiving medicine that makes you sleep (general anesthetic) during surgery. DIAGNOSIS Your caregiver may ask for tests to be done if the problems do not improve after a few days. Tests may also be done if symptoms are severe or if the reason for the nausea and vomiting is not clear. Tests may include:  Urine tests.  Blood tests.  Stool tests.  Cultures (to look for evidence of infection).  X-rays or other imaging studies. Test results can help your caregiver make decisions about treatment or the need for additional tests. TREATMENT You need to stay well hydrated. Drink frequently but in small amounts.You may wish to  drink water, sports drinks, clear broth, or eat frozen ice pops or gelatin dessert to help stay hydrated.When you eat, eating slowly may help prevent nausea.There are also some antinausea medicines that may help prevent nausea. HOME CARE INSTRUCTIONS   Take all medicine as directed by your caregiver.  If you do not have an appetite, do not force yourself to eat. However, you must continue to drink fluids.  If you have an appetite, eat a normal diet unless your caregiver tells you differently.  Eat a variety of complex carbohydrates (rice, wheat, potatoes, bread), lean meats, yogurt, fruits, and vegetables.  Avoid high-fat foods because they are more difficult to digest.  Drink enough water and fluids to keep your urine clear or pale yellow.  If you are dehydrated, ask your caregiver for specific rehydration instructions. Signs of dehydration may include:  Severe thirst.  Dry lips and mouth.  Dizziness.  Dark urine.  Decreasing urine frequency and amount.  Confusion.  Rapid breathing or pulse. SEEK IMMEDIATE MEDICAL CARE IF:   You have blood or brown flecks (like coffee grounds) in your vomit.  You have black or bloody stools.  You have a severe headache or stiff neck.  You are confused.  You have severe abdominal pain.  You have chest pain or trouble breathing.  You do not urinate at least once every 8 hours.  You develop cold or clammy skin.  You continue to vomit for longer than 24 to 48 hours.  You have a fever. MAKE SURE YOU:   Understand these instructions.  Will watch your condition.  Will get help right away if you are not doing well or get worse.   This information is not intended to replace advice  given to you by your health care provider. Make sure you discuss any questions you have with your health care provider.   Document Released: 02/07/2005 Document Revised: 05/02/2011 Document Reviewed: 07/07/2010 Elsevier Interactive Patient Education  [employer].

## 2015-01-27 ENCOUNTER — Emergency Department (HOSPITAL_BASED_OUTPATIENT_CLINIC_OR_DEPARTMENT_OTHER)
Admission: EM | Admit: 2015-01-27 | Discharge: 2015-01-28 | Disposition: A | Discharge status: Home / Self Care | Payer: Medicaid Other | Attending: Emergency Medicine | Admitting: Emergency Medicine

## 2015-01-27 ENCOUNTER — Encounter (HOSPITAL_BASED_OUTPATIENT_CLINIC_OR_DEPARTMENT_OTHER): Payer: Self-pay | Admitting: *Deleted

## 2015-01-27 DIAGNOSIS — Z9889 Other specified postprocedural states: Secondary | ICD-10-CM | POA: Diagnosis not present

## 2015-01-27 DIAGNOSIS — Z9851 Tubal ligation status: Secondary | ICD-10-CM | POA: Insufficient documentation

## 2015-01-27 DIAGNOSIS — Z8701 Personal history of pneumonia (recurrent): Secondary | ICD-10-CM | POA: Diagnosis not present

## 2015-01-27 DIAGNOSIS — Z85028 Personal history of other malignant neoplasm of stomach: Secondary | ICD-10-CM | POA: Insufficient documentation

## 2015-01-27 DIAGNOSIS — Z7901 Long term (current) use of anticoagulants: Secondary | ICD-10-CM | POA: Insufficient documentation

## 2015-01-27 DIAGNOSIS — Z9221 Personal history of antineoplastic chemotherapy: Secondary | ICD-10-CM | POA: Diagnosis not present

## 2015-01-27 DIAGNOSIS — K219 Gastro-esophageal reflux disease without esophagitis: Secondary | ICD-10-CM | POA: Insufficient documentation

## 2015-01-27 DIAGNOSIS — Z8742 Personal history of other diseases of the female genital tract: Secondary | ICD-10-CM | POA: Insufficient documentation

## 2015-01-27 DIAGNOSIS — I10 Essential (primary) hypertension: Secondary | ICD-10-CM | POA: Diagnosis not present

## 2015-01-27 DIAGNOSIS — Z903 Acquired absence of stomach [part of]: Secondary | ICD-10-CM | POA: Diagnosis not present

## 2015-01-27 DIAGNOSIS — R109 Unspecified abdominal pain: Secondary | ICD-10-CM | POA: Insufficient documentation

## 2015-01-27 DIAGNOSIS — Z87891 Personal history of nicotine dependence: Secondary | ICD-10-CM | POA: Insufficient documentation

## 2015-01-27 DIAGNOSIS — R112 Nausea with vomiting, unspecified: Secondary | ICD-10-CM | POA: Diagnosis not present

## 2015-01-27 DIAGNOSIS — R079 Chest pain, unspecified: Secondary | ICD-10-CM | POA: Diagnosis present

## 2015-01-27 DIAGNOSIS — R011 Cardiac murmur, unspecified: Secondary | ICD-10-CM | POA: Diagnosis not present

## 2015-01-27 DIAGNOSIS — G8929 Other chronic pain: Secondary | ICD-10-CM | POA: Insufficient documentation

## 2015-01-27 DIAGNOSIS — Z86711 Personal history of pulmonary embolism: Secondary | ICD-10-CM | POA: Diagnosis not present

## 2015-01-27 LAB — CBC WITH DIFFERENTIAL/PLATELET
BASOS ABS: 0 10*3/uL (ref 0.0–0.1)
BASOS PCT: 1 %
EOS ABS: 0 10*3/uL (ref 0.0–0.7)
Eosinophils Relative: 0 %
HEMATOCRIT: 34.5 % — AB (ref 36.0–46.0)
HEMOGLOBIN: 11.1 g/dL — AB (ref 12.0–15.0)
Lymphocytes Relative: 9 %
Lymphs Abs: 0.4 10*3/uL — ABNORMAL LOW (ref 0.7–4.0)
MCH: 27.3 pg (ref 26.0–34.0)
MCHC: 32.2 g/dL (ref 30.0–36.0)
MCV: 85 fL (ref 78.0–100.0)
MONO ABS: 0.2 10*3/uL (ref 0.1–1.0)
Monocytes Relative: 4 %
NEUTROS ABS: 3.4 10*3/uL (ref 1.7–7.7)
NEUTROS PCT: 86 %
Platelets: 369 10*3/uL (ref 150–400)
RBC: 4.06 MIL/uL (ref 3.87–5.11)
RDW: 14 % (ref 11.5–15.5)
WBC: 3.9 10*3/uL — ABNORMAL LOW (ref 4.0–10.5)

## 2015-01-27 MED ORDER — ONDANSETRON HCL 4 MG/2ML IJ SOLN
4.0000 mg | Freq: Once | INTRAMUSCULAR | Status: AC
  Administered 2015-01-27: 4 mg via INTRAVENOUS
  Filled 2015-01-27: qty 2

## 2015-01-27 MED ORDER — HYDROMORPHONE HCL 1 MG/ML IJ SOLN
1.0000 mg | Freq: Once | INTRAMUSCULAR | Status: AC
  Administered 2015-01-27: 1 mg via INTRAVENOUS
  Filled 2015-01-27: qty 1

## 2015-01-27 MED ORDER — PROMETHAZINE HCL 25 MG/ML IJ SOLN
12.5000 mg | Freq: Once | INTRAMUSCULAR | Status: AC
  Administered 2015-01-27: 12.5 mg via INTRAVENOUS
  Filled 2015-01-27: qty 1

## 2015-01-27 MED ORDER — PANTOPRAZOLE SODIUM 40 MG IV SOLR
40.0000 mg | Freq: Once | INTRAVENOUS | Status: AC
  Administered 2015-01-27: 40 mg via INTRAVENOUS
  Filled 2015-01-27: qty 40

## 2015-01-27 NOTE — ED Notes (Signed)
MD at bedside. 

## 2015-01-27 NOTE — ED Notes (Signed)
States she is having pressure in her chest, abdomen and back. States the pain is the same as yesterday when she was seen here.

## 2015-01-27 NOTE — ED Provider Notes (Signed)
CSN: [id number]     Arrival date & time 01/27/15  2158 History  By signing my name below, I, Paula [name], attest that this documentation has been prepared under the direction and in the presence of Shanon Rosser, MD. Electronically Signed: Soijett [name], ED Scribe. 01/27/2015. 11:37 PM.   Chief Complaint  Patient presents with  . Chest Pain      The history is provided by the patient. No language interpreter was used.    HPI Comments: Paula [name] is a 49 y.o. female with a medical hx of stomach CA status post partial gastrectomy and gastrojejunostomy who presents to the Emergency Department complaining of 10/10 chest wall and abdominal pain onset today. She has seen yesterday for the same and was discharged home in improved condition. Her symptoms returned today, specifically nausea and vomiting with subsequent chest wall and abdominal pain. She states that she went to see her gastroenterologist Dr. Penelope [name] today who is referring her to [facility] to better investigate why she keeps having these episodes. She describes her symptoms as severe present time. She denies fever, chills.   Past Medical History  Diagnosis Date  . Headache(784.0) 2011    Migraines  . Esophageal reflux     On omeprazole  . Costochondritis   . Hypertension     started around 2011  . Ovarian cyst   . Stomach cancer ([place])   . Family history of cancer   . Heart murmur   . On antineoplastic chemotherapy     5-FU and leucovorin  . Pneumonia   . Pulmonary emboli [facility])    Past Surgical History  Procedure Laterality Date  . Oophorectomy      Around 1985 left ovary removal  . Tonsillectomy      Around 1994  . Tubal ligation    . Esophagogastroduodenoscopy N/A 09/16/2013    Procedure: ESOPHAGOGASTRODUODENOSCOPY (EGD);  Surgeon: Wonda Horner, MD;  Location: [facility];  Service: Endoscopy;  Laterality: N/A;  . Laparoscopy N/A 09/20/2013    Procedure: DIAGNOSTIC LAPAROSCOPY,DISTAL GASTRECTOMY AND FEEDING  GASTROJEJUNOSTOMY;  Surgeon: [name], MD;  Location: [facility];  Service: General;  Laterality: N/A;  . Portacath placement Left 11/18/2013    Procedure: INSERTION PORT-A-CATH;  Surgeon: [name], MD;  Location: [facility];  Service: General;  Laterality: Left;   Family History  Problem Relation Age of Onset  . Hypertension Mother   . Diabetes Mellitus II Mother   . Other Mother     hx of hysterectomy for unspecified reason  . Hypertension Sister   . Hypertension Brother   . Heart failure Maternal Grandmother   . Heart Problems Maternal Grandmother   . Hypertension Maternal Grandfather   . Diabetes Maternal Grandfather   . Colon cancer Paternal Uncle     dx. late 50s-early 60s  . Gastric cancer Cousin 64  . Colon cancer Cousin     dx. early-late 76s; (father also had colon cancer)  . Colon cancer Paternal Uncle     dx. late 50s-early 60s  . Lung cancer Paternal Uncle     dx. 36s; smoker  . Colon polyps Paternal Uncle     unknown number  . Heart attack Maternal Aunt   . Heart Problems Paternal Aunt   . Heart attack Paternal Grandfather   . Colon cancer Paternal Grandfather     dx. 50s-60s  . Colon polyps Brother     1 maternal half-brother had approx 3 polyps  . Breast cancer Cousin 82  .  Breast cancer Cousin     dx. early 16s or earlier (father had colon cancer)  . Lung cancer Other     PGF's brothers (x4); dx. later in life; lim info   Social History  Substance Use Topics  . Smoking status: Former Smoker -- 1.00 packs/day for 16 years    Quit date: 02/21/2002  . Smokeless tobacco: Never Used     Comment: 0.5-1.0 ppd for 16 yrs  . Alcohol Use: No     Comment: wine at dinner previously   OB History    No data available     Review of Systems  A complete 10 system review of systems was obtained and all systems are negative except as noted in the HPI and PMH.    Allergies  Review of patient's allergies indicates no known allergies.  Home Medications   Prior  to Admission medications   Medication Sig Start Date End Date Taking? Authorizing Provider  carvedilol (COREG) 6.25 MG tablet Take 6.25 mg by mouth 2 (two) times daily with a meal.    Historical Provider, MD  enoxaparin (LOVENOX) 60 MG/0.6ML injection Inject 0.5 mLs (50 mg total) into the skin daily. Patient taking differently: Inject 50 mg into the skin every 12 (twelve) hours.  12/08/14   Curt Bears, MD  Multiple Vitamins-Minerals (MULTIVITAMIN & MINERAL PO) Take 1 tablet by mouth daily.    Historical Provider, MD  ondansetron (ZOFRAN ODT) 8 MG disintegrating tablet Take 1 tablet (8 mg total) by mouth every 8 (eight) hours as needed for nausea or vomiting. 01/20/15   Susanne Borders, NP  promethazine (PHENERGAN) 25 MG tablet Take 1 tablet (25 mg total) by mouth every 6 (six) hours as needed for nausea or vomiting. 01/26/15   Domenic Moras, PA-C  traMADol (ULTRAM) 50 MG tablet Take 1 tablet (50 mg total) by mouth every 6 (six) hours. 01/20/15   Susanne Borders, NP   BP 137/114 mmHg  Pulse 75  Temp(Src) 98.4 F (36.9 C) (Oral)  Resp 18  Ht 5' (1.524 m)  Wt 118 lb (53.524 kg)  BMI 23.05 kg/m2  SpO2 100%   Physical Exam General: Well-developed, cachetic female in no acute distress; appearance consistent with age of record HENT: normocephalic; atraumatic Eyes: pupils equal, round and reactive to light; extraocular muscles intact Neck: supple Heart: regular rate and rhythm Lungs: clear to auscultation bilaterally Chest: anterior rib tenderness Abdomen: soft; nondistended; diffusely tender; no masses or hepatosplenomegaly; bowel sounds hypoactive Extremities: No deformity; full range of motion; pulses normal Neurologic: Awake, alert and oriented; motor function intact in all extremities and symmetric; no facial droop Skin: Warm and dry Psychiatric: Tearful   ED Course  Procedures (including critical care time) DIAGNOSTIC STUDIES: Oxygen Saturation is 99% on RA, nl by my  interpretation.    COORDINATION OF CARE: 11:35 PM Discussed treatment plan with pt at bedside and pt agreed to plan.   MDM   Nursing notes and vitals signs, including pulse oximetry, reviewed.  Summary of this visit's results, reviewed by myself:  Labs:  Results for orders placed or performed during the hospital encounter of 01/27/15 (from the past 24 hour(s))  Basic metabolic panel     Status: Abnormal   Collection Time: 01/27/15 10:39 PM  Result Value Ref Range   Sodium 136 135 - 145 mmol/L   Potassium 3.5 3.5 - 5.1 mmol/L   Chloride 102 101 - 111 mmol/L   CO2 25 22 - 32 mmol/L  Glucose, Bld 146 (H) 65 - 99 mg/dL   BUN 6 6 - 20 mg/dL   Creatinine, Ser 0.69 0.44 - 1.00 mg/dL   Calcium 9.3 8.9 - 10.3 mg/dL   GFR calc non Af Amer >60 >60 mL/min   GFR calc Af Amer >60 >60 mL/min   Anion gap 9 5 - 15  CBC with Differential/Platelet     Status: Abnormal   Collection Time: 01/27/15 10:39 PM  Result Value Ref Range   WBC 3.9 (L) 4.0 - 10.5 K/uL   RBC 4.06 3.87 - 5.11 MIL/uL   Hemoglobin 11.1 (L) 12.0 - 15.0 g/dL   HCT 34.5 (L) 36.0 - 46.0 %   MCV 85.0 78.0 - 100.0 fL   MCH 27.3 26.0 - 34.0 pg   MCHC 32.2 30.0 - 36.0 g/dL   RDW 14.0 11.5 - 15.5 %   Platelets 369 150 - 400 K/uL   Neutrophils Relative % 86 %   Neutro Abs 3.4 1.7 - 7.7 K/uL   Lymphocytes Relative 9 %   Lymphs Abs 0.4 (L) 0.7 - 4.0 K/uL   Monocytes Relative 4 %   Monocytes Absolute 0.2 0.1 - 1.0 K/uL   Eosinophils Relative 0 %   Eosinophils Absolute 0.0 0.0 - 0.7 K/uL   Basophils Relative 1 %   Basophils Absolute 0.0 0.0 - 0.1 K/uL   1:06 AM Pain and nausea controlled with IV medications.  Final diagnoses:  Nausea and vomiting in adult patient  Chronic abdominal pain   I personally performed the services described in this documentation, which was scribed in my presence. The recorded information has been reviewed and is accurate.    Shanon Rosser, MD 01/28/15 [phone]

## 2015-01-28 ENCOUNTER — Encounter: Payer: Self-pay | Admitting: Genetic Counselor

## 2015-01-28 LAB — BASIC METABOLIC PANEL
ANION GAP: 9 (ref 5–15)
BUN: 6 mg/dL (ref 6–20)
CHLORIDE: 102 mmol/L (ref 101–111)
CO2: 25 mmol/L (ref 22–32)
CREATININE: 0.69 mg/dL (ref 0.44–1.00)
Calcium: 9.3 mg/dL (ref 8.9–10.3)
GFR calc non Af Amer: >60 mL/min (ref >60)
Glucose, Bld: 146 mg/dL — ABNORMAL HIGH (ref 65–99)
Potassium: 3.5 mmol/L (ref 3.5–5.1)
SODIUM: 136 mmol/L (ref 135–145)

## 2015-01-28 MED ORDER — PROMETHAZINE HCL 25 MG/ML IJ SOLN
12.5000 mg | Freq: Once | INTRAMUSCULAR | Status: AC
  Administered 2015-01-28: 12.5 mg via INTRAVENOUS
  Filled 2015-01-28: qty 1

## 2015-01-28 MED ORDER — HYDROMORPHONE HCL 1 MG/ML IJ SOLN
1.0000 mg | Freq: Once | INTRAMUSCULAR | Status: AC
  Administered 2015-01-28: 1 mg via INTRAVENOUS
  Filled 2015-01-28: qty 1

## 2015-01-28 MED ORDER — LORAZEPAM 1 MG PO TABS: 1.0000 mg | ORAL_TABLET | Freq: Three times a day (TID) | ORAL | Status: DC | PRN

## 2015-02-03 NOTE — Progress Notes (Signed)
HPI: FU chest pain. Patient has had occasional chest pain since 1999. Previous episodes were attributed to anxiety by her report. Nuclear study in October 2013 showed an ejection fraction of 74% and normal perfusion. Last echocardiogram January 2015 showed normal LV function and moderate mitral regurgitation. Since I last saw her in Dec 2014, She does have some dyspnea on exertion but no orthopnea, PND or syncope. Occasional chest pain with vomiting.  Current Outpatient Prescriptions  Medication Sig Dispense Refill  . carvedilol (COREG) 6.25 MG tablet Take 6.25 mg by mouth 2 (two) times daily with a meal.    . enoxaparin (LOVENOX) 60 MG/0.6ML injection Inject 0.5 mLs (50 mg total) into the skin daily. (Patient taking differently: Inject 50 mg into the skin every 12 (twelve) hours. ) 30 Syringe 1  . LORazepam (ATIVAN) 1 MG tablet Take 1 tablet (1 mg total) by mouth 3 (three) times daily as needed for anxiety. 15 tablet 0  . metoCLOPramide (REGLAN) 5 MG tablet Take 5 mg by mouth 3 (three) times daily before meals.    . Multiple Vitamins-Minerals (MULTIVITAMIN & MINERAL PO) Take 1 tablet by mouth daily.    Marland Kitchen omeprazole (PRILOSEC) 20 MG capsule TK 1 C PO QD  5  . promethazine (PHENERGAN) 25 MG tablet Take 1 tablet (25 mg total) by mouth every 6 (six) hours as needed for nausea or vomiting. 30 tablet 0  . traMADol (ULTRAM) 50 MG tablet Take 1 tablet (50 mg total) by mouth every 6 (six) hours. 45 tablet 0  . [DISCONTINUED] pantoprazole (PROTONIX) 40 MG tablet Take 1 tablet (40 mg total) by mouth 2 (two) times daily before a meal. (Patient not taking: Reported on 07/18/2014) 60 tablet 3  . [DISCONTINUED] temazepam (RESTORIL) 30 MG capsule Take 1 capsule (30 mg total) by mouth at bedtime as needed for sleep. (Patient not taking: Reported on 07/18/2014) 30 capsule 0   Current Facility-Administered Medications  Medication Dose Route Frequency Provider Last Rate Last Dose  . medroxyPROGESTERone  (DEPO-PROVERA) injection 150 mg  150 mg Intramuscular Q90 days Osborne Oman, MD   150 mg at 12/05/14 1134     Past Medical History  Diagnosis Date  . Headache(784.0) 2011    Migraines  . Esophageal reflux     On omeprazole  . Costochondritis   . Hypertension     started around 2011  . Ovarian cyst   . Stomach cancer ([place])   . Family history of cancer   . Heart murmur   . On antineoplastic chemotherapy     5-FU and leucovorin  . Pneumonia   . Pulmonary emboli [facility])     Past Surgical History  Procedure Laterality Date  . Oophorectomy      Around 1985 left ovary removal  . Tonsillectomy      Around 1994  . Tubal ligation    . Esophagogastroduodenoscopy N/A 09/16/2013    Procedure: ESOPHAGOGASTRODUODENOSCOPY (EGD);  Surgeon: Wonda Horner, MD;  Location: [facility];  Service: Endoscopy;  Laterality: N/A;  . Laparoscopy N/A 09/20/2013    Procedure: DIAGNOSTIC LAPAROSCOPY,DISTAL GASTRECTOMY AND FEEDING GASTROJEJUNOSTOMY;  Surgeon: [name], MD;  Location: [facility];  Service: General;  Laterality: N/A;  . Portacath placement Left 11/18/2013    Procedure: INSERTION PORT-A-CATH;  Surgeon: [name], MD;  Location: [facility];  Service: General;  Laterality: Left;    Social History   Social History  . Marital Status: Married    Spouse Name: N/A  .  Number of Children: 2  . Years of Education: N/A   Occupational History  .  [employer]   Social History Main Topics  . Smoking status: Former Smoker -- 1.00 packs/day for 16 years    Quit date: 02/21/2002  . Smokeless tobacco: Never Used     Comment: 0.5-1.0 ppd for 16 yrs  . Alcohol Use: No     Comment: wine at dinner previously  . Drug Use: No  . Sexual Activity: Not on file   Other Topics Concern  . Not on file   Social History Narrative    ROS: no fevers or chills, productive cough, hemoptysis, dysphasia, odynophagia, melena, hematochezia, dysuria, hematuria, rash, seizure activity, orthopnea, PND,  pedal edema, claudication. Remaining systems are negative.  Physical Exam: Well-developed well-nourished in no acute distress.  Skin is warm and dry.  HEENT is normal.  Neck is supple.  Chest is clear to auscultation with normal expansion.  Cardiovascular exam is regular rate and rhythm.  Abdominal exam nontender or distended. No masses palpated. Extremities show no edema. neuro grossly intact  ECG 01/26/2015-sinus tachycardia, left ventricular hypertrophy, nonspecific ST changes.

## 2015-02-04 ENCOUNTER — Ambulatory Visit (INDEPENDENT_AMBULATORY_CARE_PROVIDER_SITE_OTHER): Payer: Medicaid Other | Admitting: Cardiology

## 2015-02-04 ENCOUNTER — Encounter: Payer: Self-pay | Admitting: Cardiology

## 2015-02-04 VITALS — BP 99/68 | HR 77 | Ht 60.0 in | Wt 117.1 lb

## 2015-02-04 DIAGNOSIS — I10 Essential (primary) hypertension: Secondary | ICD-10-CM

## 2015-02-04 DIAGNOSIS — R072 Precordial pain: Secondary | ICD-10-CM

## 2015-02-04 DIAGNOSIS — I340 Nonrheumatic mitral (valve) insufficiency: Secondary | ICD-10-CM

## 2015-02-04 NOTE — Assessment & Plan Note (Signed)
Blood pressure controlled. Continue present medications. 

## 2015-02-04 NOTE — Patient Instructions (Signed)
Dr Stanford Breed has made no changes today in your current medications or treatment plan.  Your physician recommends that you schedule a follow-up appointment in 6 months. You will receive a reminder letter in the mail two months in advance. If you don't receive a letter, please call our office to schedule the follow-up appointment.  If you need a refill on your cardiac medications before your next appointment, please call your pharmacy.

## 2015-02-04 NOTE — Assessment & Plan Note (Signed)
Moderate on most recent echocardiogram. Will plan follow-up studies in the future.

## 2015-02-04 NOTE — Assessment & Plan Note (Signed)
Symptoms only occur with vomiting. Will not pursue further cardiac evaluation at this point.

## 2015-02-06 ENCOUNTER — Telehealth: Payer: Self-pay

## 2015-02-06 NOTE — Telephone Encounter (Signed)
Pt asking when is she going to get her port taken out?

## 2015-02-07 NOTE — Telephone Encounter (Signed)
We can order it to be removed.

## 2015-02-10 ENCOUNTER — Other Ambulatory Visit: Payer: Self-pay | Admitting: *Deleted

## 2015-02-10 DIAGNOSIS — C163 Malignant neoplasm of pyloric antrum: Secondary | ICD-10-CM

## 2015-02-10 NOTE — Progress Notes (Signed)
Order for [facility] removal entered per MD

## 2015-02-11 ENCOUNTER — Ambulatory Visit (INDEPENDENT_AMBULATORY_CARE_PROVIDER_SITE_OTHER): Payer: Medicaid Other | Admitting: Obstetrics & Gynecology

## 2015-02-11 ENCOUNTER — Other Ambulatory Visit (HOSPITAL_COMMUNITY)
Admission: RE | Admit: 2015-02-11 | Discharge: 2015-02-11 | Disposition: A | Discharge status: Home / Self Care | Payer: Medicaid Other | Source: Ambulatory Visit | Attending: Obstetrics & Gynecology | Admitting: Obstetrics & Gynecology

## 2015-02-11 ENCOUNTER — Encounter: Payer: Self-pay | Admitting: Obstetrics & Gynecology

## 2015-02-11 ENCOUNTER — Telehealth: Payer: Self-pay | Admitting: Medical Oncology

## 2015-02-11 VITALS — BP 125/80 | HR 89 | Temp 97.7°F | Ht 60.0 in | Wt 120.0 lb

## 2015-02-11 DIAGNOSIS — Z124 Encounter for screening for malignant neoplasm of cervix: Secondary | ICD-10-CM | POA: Diagnosis not present

## 2015-02-11 DIAGNOSIS — N951 Menopausal and female climacteric states: Secondary | ICD-10-CM

## 2015-02-11 DIAGNOSIS — Z1151 Encounter for screening for human papillomavirus (HPV): Secondary | ICD-10-CM | POA: Insufficient documentation

## 2015-02-11 DIAGNOSIS — Z23 Encounter for immunization: Secondary | ICD-10-CM

## 2015-02-11 DIAGNOSIS — Z01419 Encounter for gynecological examination (general) (routine) without abnormal findings: Secondary | ICD-10-CM | POA: Diagnosis not present

## 2015-02-11 MED ORDER — GABAPENTIN 600 MG PO TABS: 600.0000 mg | ORAL_TABLET | Freq: Every day | ORAL | Status: DC

## 2015-02-11 NOTE — Telephone Encounter (Signed)
-----   Message from Curt Bears, MD sent at 02/10/2015  5:27 PM EST ----- Regarding: RE: Port removal OK to stop it. ----- Message -----    From: Lucile Crater, RN    Sent: 02/10/2015   5:16 PM      To: Curt Bears, MD, [name], RN Subject: Port removal                                     Dr. [name],  IR is requesting VO for pt to stop Lovenox 24hrs prior to port removal. Thanks, Chrisandra Carota,  if you will put note in chart or call [facility] in [place] [phone] Thanks, Stanton Kidney

## 2015-02-11 NOTE — Telephone Encounter (Signed)
Per [name] I left a message for IR that pt can stop lovenox for 24 hours prior to port removal

## 2015-02-11 NOTE — Progress Notes (Signed)
GYNECOLOGY CLINIC ANNUAL PREVENTATIVE CARE ENCOUNTER NOTE  Subjective:   Paula [name] is a 49 y.o. female with history of gastric cancer here for a routine annual gynecologic exam.  Current complaints: menopausal vasomotor symptoms.  Has AUB being treated with Depo Provera, got last dose on 09/25/2014.  Denies abnormal vaginal bleeding, discharge, pelvic pain, problems with intercourse or other gynecologic concerns.    Gynecologic History Patient's last menstrual period was 02/04/2015 (within days). Last Pap: 2015. Results were: normal Last mammogram: 2013. Results were: normal   Past Medical History  Diagnosis Date  . Headache(784.0) 2011    Migraines  . Esophageal reflux     On omeprazole  . Costochondritis   . Hypertension     started around 2011  . Ovarian cyst   . Stomach cancer ([place])   . Family history of cancer   . Heart murmur   . On antineoplastic chemotherapy     5-FU and leucovorin  . Pneumonia   . Pulmonary emboli [facility])     Past Surgical History  Procedure Laterality Date  . Oophorectomy      Around 1985 left ovary removal  . Tonsillectomy      Around 1994  . Tubal ligation    . Esophagogastroduodenoscopy N/A 09/16/2013    Procedure: ESOPHAGOGASTRODUODENOSCOPY (EGD);  Surgeon: Wonda Horner, MD;  Location: [facility];  Service: Endoscopy;  Laterality: N/A;  . Laparoscopy N/A 09/20/2013    Procedure: DIAGNOSTIC LAPAROSCOPY,DISTAL GASTRECTOMY AND FEEDING GASTROJEJUNOSTOMY;  Surgeon: [name], MD;  Location: [facility];  Service: General;  Laterality: N/A;  . Portacath placement Left 11/18/2013    Procedure: INSERTION PORT-A-CATH;  Surgeon: [name], MD;  Location: [facility];  Service: General;  Laterality: Left;    Current Outpatient Prescriptions on File Prior to Visit  Medication Sig Dispense Refill  . carvedilol (COREG) 6.25 MG tablet Take 6.25 mg by mouth 2 (two) times daily with a meal.    . enoxaparin (LOVENOX) 60 MG/0.6ML injection Inject  0.5 mLs (50 mg total) into the skin daily. (Patient taking differently: Inject 50 mg into the skin every 12 (twelve) hours. ) 30 Syringe 1  . LORazepam (ATIVAN) 1 MG tablet Take 1 tablet (1 mg total) by mouth 3 (three) times daily as needed for anxiety. 15 tablet 0  . metoCLOPramide (REGLAN) 5 MG tablet Take 5 mg by mouth 3 (three) times daily before meals.    . Multiple Vitamins-Minerals (MULTIVITAMIN & MINERAL PO) Take 1 tablet by mouth daily.    . promethazine (PHENERGAN) 25 MG tablet Take 1 tablet (25 mg total) by mouth every 6 (six) hours as needed for nausea or vomiting. 30 tablet 0  . [DISCONTINUED] pantoprazole (PROTONIX) 40 MG tablet Take 1 tablet (40 mg total) by mouth 2 (two) times daily before a meal. (Patient not taking: Reported on 07/18/2014) 60 tablet 3  . [DISCONTINUED] temazepam (RESTORIL) 30 MG capsule Take 1 capsule (30 mg total) by mouth at bedtime as needed for sleep. (Patient not taking: Reported on 07/18/2014) 30 capsule 0   Current Facility-Administered Medications on File Prior to Visit  Medication Dose Route Frequency Provider Last Rate Last Dose  . medroxyPROGESTERone (DEPO-PROVERA) injection 150 mg  150 mg Intramuscular Q90 days Osborne Oman, MD   150 mg at 12/05/14 1134    No Known Allergies  Social History   Social History  . Marital Status: Married    Spouse Name: N/A  . Number of Children: 2  .  Years of Education: N/A   Occupational History  .  [employer]   Social History Main Topics  . Smoking status: Former Smoker -- 1.00 packs/day for 16 years    Quit date: 02/21/2002  . Smokeless tobacco: Never Used     Comment: 0.5-1.0 ppd for 16 yrs  . Alcohol Use: No     Comment: wine at dinner previously  . Drug Use: No  . Sexual Activity: Not on file   Other Topics Concern  . Not on file   Social History Narrative    Family History  Problem Relation Age of Onset  . Hypertension Mother   . Diabetes Mellitus II Mother   . Other Mother      hx of hysterectomy for unspecified reason  . Hypertension Sister   . Hypertension Brother   . Heart failure Maternal Grandmother   . Heart Problems Maternal Grandmother   . Hypertension Maternal Grandfather   . Diabetes Maternal Grandfather   . Colon cancer Paternal Uncle     dx. late 50s-early 60s  . Gastric cancer Cousin 79  . Colon cancer Cousin     dx. early-late 20s; (father also had colon cancer)  . Colon cancer Paternal Uncle     dx. late 50s-early 60s  . Lung cancer Paternal Uncle     dx. 74s; smoker  . Colon polyps Paternal Uncle     unknown number  . Heart attack Maternal Aunt   . Heart Problems Paternal Aunt   . Heart attack Paternal Grandfather   . Colon cancer Paternal Grandfather     dx. 50s-60s  . Colon polyps Brother     1 maternal half-brother had approx 3 polyps  . Breast cancer Cousin 17  . Breast cancer Cousin     dx. early 45s or earlier (father had colon cancer)  . Lung cancer Other     PGF's brothers (x4); dx. later in life; lim info    The following portions of the patient's history were reviewed and updated as appropriate: allergies, current medications, past family history, past medical history, past social history, past surgical history and problem list.  Review of Systems A comprehensive review of systems was negative.   Objective:  BP 125/80 mmHg  Pulse 89  Temp(Src) 97.7 F (36.5 C) (Oral)  Ht 5' (1.524 m)  Wt 120 lb (54.432 kg)  BMI 23.44 kg/m2  LMP 02/04/2015 (Within Days) CONSTITUTIONAL: Well-developed, well-nourished female in no acute distress.  HENT:  Normocephalic, atraumatic, External right and left ear normal. Oropharynx is clear and moist EYES: Conjunctivae and EOM are normal. Pupils are equal, round, and reactive to light. No scleral icterus.  NECK: Normal range of motion, supple, no masses.  Normal thyroid.  SKIN: Skin is warm and dry. No rash noted. Not diaphoretic. No erythema. No pallor. [place]: Alert and oriented  to person, place, and time. Normal reflexes, muscle tone coordination. No cranial nerve deficit noted. PSYCHIATRIC: Normal mood and affect. Normal behavior. Normal judgment and thought content. CARDIOVASCULAR: Normal heart rate noted, regular rhythm RESPIRATORY: Clear to auscultation bilaterally. Effort and breath sounds normal, no problems with respiration noted. BREASTS: Symmetric in size. No masses, skin changes, nipple drainage, or lymphadenopathy. ABDOMEN: Soft, normal bowel sounds, no distention noted.  No tenderness, rebound or guarding.  PELVIC: Normal appearing external genitalia; normal appearing vaginal mucosa and cervix.  No abnormal discharge noted.  Pap smear obtained.  Normal uterine size, no other palpable masses, no uterine or adnexal  tenderness. MUSCULOSKELETAL: Normal range of motion. No tenderness.  No cyanosis, clubbing, or edema.  2+ distal pulses.  Assessment:  Annual gynecologic examination with pap smear Menopausal symptoms   Plan:  Will follow up results of pap smear and manage accordingly. Mammogram scheduled, flu vaccine administered. Advised about other ways to manage vasomotor symptoms, estrogen is not a good choice given history of neoplasia.  Advised Neurontin vs Effexor; patient desires Neurontin which was prescribed. Routine preventative health maintenance measures emphasized. Please refer to After Visit Summary for other counseling recommendations.    Verita Schneiders, MD, [place] Attending Obstetrician & Gynecologist, [place] [facility]

## 2015-02-11 NOTE — Patient Instructions (Signed)
Return to clinic for any scheduled appointments or for any gynecologic concerns as needed.   

## 2015-02-12 ENCOUNTER — Telehealth: Payer: Self-pay | Admitting: *Deleted

## 2015-02-12 NOTE — Telephone Encounter (Signed)
VM message received from patient @8 :41 am, requesting pain medicine prescription.  TC to patient. She states she saw her GYN doctor yesterday to have routine pelvic exam done.  She states she was nauseated afterwards-on her way home in her car and vomited x 1. Today she states she is having back pain and rib pain and needs percocet.  Asked pt if she had tried Tylenol and she stated that all that Dr. [name] told to take over the counter was Zyrtec. Pt states she is out of tramadol.  Neither percocet or tramadol on patient med list.  Last  Seen here with in [facility] on 01/20/15 for nausea/pain and it appears she might have gotten a prescription for tramadol then, but not listed.

## 2015-02-13 ENCOUNTER — Telehealth: Payer: Self-pay | Admitting: Medical Oncology

## 2015-02-13 ENCOUNTER — Telehealth: Payer: Self-pay

## 2015-02-13 LAB — CYTOLOGY - PAP

## 2015-02-13 MED ORDER — GABAPENTIN 300 MG PO CAPS: 600.0000 mg | ORAL_CAPSULE | Freq: Every day | ORAL | Status: DC

## 2015-02-13 NOTE — Telephone Encounter (Signed)
-----   Message from Curt Bears, MD sent at 02/12/2015  5:35 PM EST ----- Regarding: RE: pain med request She can take Tylenol or call PCP for other p[ain medications. She is currently on observation. ----- Message -----    From: [name], RN    Sent: 02/12/2015   4:33 PM      To: Curt Bears, MD Subject: pain med request                               What do you want me to tell pt? Pt called triage requesting pain medicine prescription.  " She states she saw her GYN doctor yesterday to have routine pelvic exam done. She states she was nauseated afterwards-on her way home in her car and vomited x 1. Today she states she is having back pain and rib pain and needs percocet. Asked pt if she had tried Tylenol and she stated that all that Dr. [name] told to take over the counter was Zyrtec. Pt states she is out of tramadol. Neither percocet or tramadol on patient med list. Last Seen here with in [facility] on 01/20/15 for nausea/pain and it appears she might have gotten a prescription for tramadol then, but not listed."

## 2015-02-13 NOTE — Telephone Encounter (Signed)
Called pt and LM that we have resubmitted her Rx to her pharmacy.  She should be able to go to pharmacy to pick up medication.  If she has any questions to please give the office a call.   [place] and was informed that the Rx for Neurontin 300 mg capsules, Neurontin 300 mg 2 capsules for 600 mg po qhs,  pt's insurance will cover.   Called pt and informed pt that her medication will be covered by her insurance that she needs to take two 300 mg  capsules instead of one capsule to total dosage of 600 mg once daily at bedtime.  Pt stated understanding to the instructions and did not have any other questions.

## 2015-02-13 NOTE — Telephone Encounter (Signed)
This message given to pt .

## 2015-02-18 ENCOUNTER — Emergency Department (HOSPITAL_BASED_OUTPATIENT_CLINIC_OR_DEPARTMENT_OTHER): Payer: Medicaid Other

## 2015-02-18 ENCOUNTER — Other Ambulatory Visit: Payer: Self-pay

## 2015-02-18 ENCOUNTER — Emergency Department (HOSPITAL_BASED_OUTPATIENT_CLINIC_OR_DEPARTMENT_OTHER)
Admission: EM | Admit: 2015-02-18 | Discharge: 2015-02-18 | Disposition: A | Discharge status: Home / Self Care | Payer: Medicaid Other | Attending: Emergency Medicine | Admitting: Emergency Medicine

## 2015-02-18 ENCOUNTER — Encounter (HOSPITAL_BASED_OUTPATIENT_CLINIC_OR_DEPARTMENT_OTHER): Payer: Self-pay | Admitting: Emergency Medicine

## 2015-02-18 DIAGNOSIS — R011 Cardiac murmur, unspecified: Secondary | ICD-10-CM | POA: Insufficient documentation

## 2015-02-18 DIAGNOSIS — Z79899 Other long term (current) drug therapy: Secondary | ICD-10-CM | POA: Diagnosis not present

## 2015-02-18 DIAGNOSIS — R109 Unspecified abdominal pain: Secondary | ICD-10-CM

## 2015-02-18 DIAGNOSIS — R1084 Generalized abdominal pain: Secondary | ICD-10-CM | POA: Diagnosis not present

## 2015-02-18 DIAGNOSIS — Z8701 Personal history of pneumonia (recurrent): Secondary | ICD-10-CM | POA: Diagnosis not present

## 2015-02-18 DIAGNOSIS — Z86711 Personal history of pulmonary embolism: Secondary | ICD-10-CM | POA: Insufficient documentation

## 2015-02-18 DIAGNOSIS — Z85028 Personal history of other malignant neoplasm of stomach: Secondary | ICD-10-CM | POA: Diagnosis not present

## 2015-02-18 DIAGNOSIS — Z87891 Personal history of nicotine dependence: Secondary | ICD-10-CM | POA: Insufficient documentation

## 2015-02-18 DIAGNOSIS — Z8742 Personal history of other diseases of the female genital tract: Secondary | ICD-10-CM | POA: Diagnosis not present

## 2015-02-18 DIAGNOSIS — Z9049 Acquired absence of other specified parts of digestive tract: Secondary | ICD-10-CM | POA: Insufficient documentation

## 2015-02-18 DIAGNOSIS — R112 Nausea with vomiting, unspecified: Secondary | ICD-10-CM | POA: Diagnosis not present

## 2015-02-18 DIAGNOSIS — K219 Gastro-esophageal reflux disease without esophagitis: Secondary | ICD-10-CM | POA: Diagnosis not present

## 2015-02-18 DIAGNOSIS — G43909 Migraine, unspecified, not intractable, without status migrainosus: Secondary | ICD-10-CM | POA: Insufficient documentation

## 2015-02-18 DIAGNOSIS — Z7901 Long term (current) use of anticoagulants: Secondary | ICD-10-CM | POA: Insufficient documentation

## 2015-02-18 DIAGNOSIS — Z8739 Personal history of other diseases of the musculoskeletal system and connective tissue: Secondary | ICD-10-CM | POA: Insufficient documentation

## 2015-02-18 DIAGNOSIS — I10 Essential (primary) hypertension: Secondary | ICD-10-CM | POA: Insufficient documentation

## 2015-02-18 DIAGNOSIS — Z9851 Tubal ligation status: Secondary | ICD-10-CM | POA: Insufficient documentation

## 2015-02-18 DIAGNOSIS — Z9221 Personal history of antineoplastic chemotherapy: Secondary | ICD-10-CM | POA: Diagnosis not present

## 2015-02-18 DIAGNOSIS — G8929 Other chronic pain: Secondary | ICD-10-CM

## 2015-02-18 LAB — CBC WITH DIFFERENTIAL/PLATELET
Basophils Absolute: 0 10*3/uL (ref 0.0–0.1)
Basophils Relative: 0 %
EOS PCT: 0 %
Eosinophils Absolute: 0 10*3/uL (ref 0.0–0.7)
HCT: 33.9 % — ABNORMAL LOW (ref 36.0–46.0)
Hemoglobin: 10.8 g/dL — ABNORMAL LOW (ref 12.0–15.0)
LYMPHS ABS: 0.4 10*3/uL — AB (ref 0.7–4.0)
LYMPHS PCT: 9 %
MCH: 27.3 pg (ref 26.0–34.0)
MCHC: 31.9 g/dL (ref 30.0–36.0)
MCV: 85.8 fL (ref 78.0–100.0)
MONO ABS: 0.2 10*3/uL (ref 0.1–1.0)
MONOS PCT: 5 %
Neutro Abs: 3.6 10*3/uL (ref 1.7–7.7)
Neutrophils Relative %: 86 %
PLATELETS: 337 10*3/uL (ref 150–400)
RBC: 3.95 MIL/uL (ref 3.87–5.11)
RDW: 15 % (ref 11.5–15.5)
WBC: 4.2 10*3/uL (ref 4.0–10.5)

## 2015-02-18 LAB — COMPREHENSIVE METABOLIC PANEL
ALBUMIN: 4.6 g/dL (ref 3.5–5.0)
ALT: 17 U/L (ref 14–54)
AST: 21 U/L (ref 15–41)
Alkaline Phosphatase: 89 U/L (ref 38–126)
Anion gap: 8 (ref 5–15)
BILIRUBIN TOTAL: 0.8 mg/dL (ref 0.3–1.2)
BUN: 6 mg/dL (ref 6–20)
CHLORIDE: 105 mmol/L (ref 101–111)
CO2: 24 mmol/L (ref 22–32)
Calcium: 9.4 mg/dL (ref 8.9–10.3)
Creatinine, Ser: 0.65 mg/dL (ref 0.44–1.00)
GFR calc Af Amer: >60 mL/min (ref >60)
GFR calc non Af Amer: >60 mL/min (ref >60)
GLUCOSE: 147 mg/dL — AB (ref 65–99)
POTASSIUM: 3.6 mmol/L (ref 3.5–5.1)
Sodium: 137 mmol/L (ref 135–145)
TOTAL PROTEIN: 8.3 g/dL — AB (ref 6.5–8.1)

## 2015-02-18 LAB — LIPASE, BLOOD: Lipase: 14 U/L (ref 11–51)

## 2015-02-18 MED ORDER — MORPHINE SULFATE (PF) 4 MG/ML IV SOLN
4.0000 mg | Freq: Once | INTRAVENOUS | Status: AC
  Administered 2015-02-18: 4 mg via INTRAVENOUS
  Filled 2015-02-18: qty 1

## 2015-02-18 MED ORDER — METOCLOPRAMIDE HCL 5 MG/ML IJ SOLN
10.0000 mg | Freq: Once | INTRAMUSCULAR | Status: AC
  Administered 2015-02-18: 10 mg via INTRAVENOUS
  Filled 2015-02-18: qty 2

## 2015-02-18 MED ORDER — SODIUM CHLORIDE 0.9 % IV BOLUS (SEPSIS)
1000.0000 mL | Freq: Once | INTRAVENOUS | Status: AC
  Administered 2015-02-18: 1000 mL via INTRAVENOUS

## 2015-02-18 MED ORDER — HYDROMORPHONE HCL 1 MG/ML IJ SOLN
1.0000 mg | Freq: Once | INTRAMUSCULAR | Status: AC
  Administered 2015-02-18: 1 mg via INTRAVENOUS
  Filled 2015-02-18: qty 1

## 2015-02-18 MED ORDER — TRAMADOL HCL 50 MG PO TABS: 50.0000 mg | ORAL_TABLET | Freq: Four times a day (QID) | ORAL | Status: DC | PRN

## 2015-02-18 NOTE — ED Notes (Signed)
Patient stable and ambulatory.  Patient verbalizes understanding of discharge medications, instructions and follow-up. 

## 2015-02-18 NOTE — Discharge Instructions (Signed)
Read the information below.  Use the prescribed medication as directed.  Please discuss all new medications with your pharmacist.  You may return to the Emergency Department at any time for worsening condition or any new symptoms that concern you.    If you develop high fevers, worsening abdominal pain, uncontrolled vomiting, or are unable to tolerate fluids by mouth, return to the ER for a recheck.     Chronic Pain Chronic pain can be defined as pain that is off and on and lasts for 3-6 months or longer. Many things cause chronic pain, which can make it difficult to make a diagnosis. There are many treatment options available for chronic pain. However, finding a treatment that works well for you may require trying various approaches until the right one is found. Many people benefit from a combination of two or more types of treatment to control their pain. SYMPTOMS  Chronic pain can occur anywhere in the body and can range from mild to very severe. Some types of chronic pain include:  Headache.  Low back pain.  Cancer pain.  Arthritis pain.  Neurogenic pain. This is pain resulting from damage to nerves. People with chronic pain may also have other symptoms such as:  Depression.  Anger.  Insomnia.  Anxiety. DIAGNOSIS  Your health care provider will help diagnose your condition over time. In many cases, the initial focus will be on excluding possible conditions that could be causing the pain. Depending on your symptoms, your health care provider may order tests to diagnose your condition. Some of these tests may include:   Blood tests.   CT scan.   MRI.   X-rays.   Ultrasounds.   Nerve conduction studies.  You may need to see a specialist.  TREATMENT  Finding treatment that works well may take time. You may be referred to a pain specialist. He or she may prescribe medicine or therapies, such as:   Mindful meditation or yoga.  Shots (injections) of numbing or  pain-relieving medicines into the spine or area of pain.  Local electrical stimulation.  Acupuncture.   Massage therapy.   Aroma, color, light, or sound therapy.   Biofeedback.   Working with a physical therapist to keep from getting stiff.   Regular, gentle exercise.   Cognitive or behavioral therapy.   Group support.  Sometimes, surgery may be recommended.  HOME CARE INSTRUCTIONS   Take all medicines as directed by your health care provider.   Lessen stress in your life by relaxing and doing things such as listening to calming music.   Exercise or be active as directed by your health care provider.   Eat a healthy diet and include things such as vegetables, fruits, fish, and lean meats in your diet.   Keep all follow-up appointments with your health care provider.   Attend a support group with others suffering from chronic pain. SEEK MEDICAL CARE IF:   Your pain gets worse.   You develop a new pain that was not there before.   You cannot tolerate medicines given to you by your health care provider.   You have new symptoms since your last visit with your health care provider.  SEEK IMMEDIATE MEDICAL CARE IF:   You feel weak.   You have decreased sensation or numbness.   You lose control of bowel or bladder function.   Your pain suddenly gets much worse.   You develop shaking.  You develop chills.  You develop confusion.  You  develop chest pain.  You develop shortness of breath.  MAKE SURE YOU:  Understand these instructions.  Will watch your condition.  Will get help right away if you are not doing well or get worse.   This information is not intended to replace advice given to you by your health care provider. Make sure you discuss any questions you have with your health care provider.   Document Released: 10/30/2001 Document Revised: 10/10/2012 Document Reviewed: 08/03/2012 Elsevier Interactive Patient Education 2016  Elsevier Inc.  Nausea and Vomiting Nausea is a sick feeling that often comes before throwing up (vomiting). Vomiting is a reflex where stomach contents come out of your mouth. Vomiting can cause severe loss of body fluids (dehydration). Children and elderly adults can become dehydrated quickly, especially if they also have diarrhea. Nausea and vomiting are symptoms of a condition or disease. It is important to find the cause of your symptoms. CAUSES   Direct irritation of the stomach lining. This irritation can result from increased acid production (gastroesophageal reflux disease), infection, food poisoning, taking certain medicines (such as nonsteroidal anti-inflammatory drugs), alcohol use, or tobacco use.  Signals from the brain.These signals could be caused by a headache, heat exposure, an inner ear disturbance, increased pressure in the brain from injury, infection, a tumor, or a concussion, pain, emotional stimulus, or metabolic problems.  An obstruction in the gastrointestinal tract (bowel obstruction).  Illnesses such as diabetes, hepatitis, gallbladder problems, appendicitis, kidney problems, cancer, sepsis, atypical symptoms of a heart attack, or eating disorders.  Medical treatments such as chemotherapy and radiation.  Receiving medicine that makes you sleep (general anesthetic) during surgery. DIAGNOSIS Your caregiver may ask for tests to be done if the problems do not improve after a few days. Tests may also be done if symptoms are severe or if the reason for the nausea and vomiting is not clear. Tests may include:  Urine tests.  Blood tests.  Stool tests.  Cultures (to look for evidence of infection).  X-rays or other imaging studies. Test results can help your caregiver make decisions about treatment or the need for additional tests. TREATMENT You need to stay well hydrated. Drink frequently but in small amounts.You may wish to drink water, sports drinks, clear broth,  or eat frozen ice pops or gelatin dessert to help stay hydrated.When you eat, eating slowly may help prevent nausea.There are also some antinausea medicines that may help prevent nausea. HOME CARE INSTRUCTIONS   Take all medicine as directed by your caregiver.  If you do not have an appetite, do not force yourself to eat. However, you must continue to drink fluids.  If you have an appetite, eat a normal diet unless your caregiver tells you differently.  Eat a variety of complex carbohydrates (rice, wheat, potatoes, bread), lean meats, yogurt, fruits, and vegetables.  Avoid high-fat foods because they are more difficult to digest.  Drink enough water and fluids to keep your urine clear or pale yellow.  If you are dehydrated, ask your caregiver for specific rehydration instructions. Signs of dehydration may include:  Severe thirst.  Dry lips and mouth.  Dizziness.  Dark urine.  Decreasing urine frequency and amount.  Confusion.  Rapid breathing or pulse. SEEK IMMEDIATE MEDICAL CARE IF:   You have blood or brown flecks (like coffee grounds) in your vomit.  You have black or bloody stools.  You have a severe headache or stiff neck.  You are confused.  You have severe abdominal pain.  You have  chest pain or trouble breathing.  You do not urinate at least once every 8 hours.  You develop cold or clammy skin.  You continue to vomit for longer than 24 to 48 hours.  You have a fever. MAKE SURE YOU:   Understand these instructions.  Will watch your condition.  Will get help right away if you are not doing well or get worse.   This information is not intended to replace advice given to you by your health care provider. Make sure you discuss any questions you have with your health care provider.   Document Released: 02/07/2005 Document Revised: 05/02/2011 Document Reviewed: 07/07/2010 Elsevier Interactive Patient Education [employer].

## 2015-02-18 NOTE — ED Notes (Signed)
Pt states "Tell the doctor I'm gonna need something stronger than morphine, it doesn't work for me."

## 2015-02-18 NOTE — ED Notes (Signed)
Patient returns from radiology dept.

## 2015-02-18 NOTE — ED Notes (Signed)
Patient transported to X-ray 

## 2015-02-18 NOTE — ED Notes (Signed)
Patient able to drink and hold down ginger ale.  Patient tolerated well

## 2015-02-18 NOTE — ED Provider Notes (Signed)
CSN: [id number]     Arrival date & time 02/18/15  1131 History   First MD Initiated Contact with Patient 02/18/15 1212     Chief Complaint  Patient presents with  . Abdominal Pain     (Consider location/radiation/quality/duration/timing/severity/associated sxs/prior Treatment) The history is provided by the patient.     Pt with hx gastric cancer s/p partial gastrectomy p/w episode of what she describes as her chronic recurrent abdominal pain.  States that approximately once a month she develops pain across her entire abdomen and back with associated N/V, which then causes her central chest to hurt.  Has been vomiting since yesterday, unable to tolerate PO.  Denies hematemesis, change in bowel habits (normal BM this morning), urinary or vaginal symptoms.  GI Dr Penelope Coop has referred her to [facility] but she has not had her appointment yet.  Has been evaluated for this multiple times before and states they cannot find a cause for her pain.  This occurs approximately once a month and lasts for about 1 week at a time.  Denies any change in her pattern of recurrent symptoms.  Denies known fevers.    Past Medical History  Diagnosis Date  . Headache(784.0) 2011    Migraines  . Esophageal reflux     On omeprazole  . Costochondritis   . Hypertension     started around 2011  . Ovarian cyst   . Stomach cancer ([place])   . Family history of cancer   . Heart murmur   . On antineoplastic chemotherapy     5-FU and leucovorin  . Pneumonia   . Pulmonary emboli [facility])    Past Surgical History  Procedure Laterality Date  . Oophorectomy      Around 1985 left ovary removal  . Tonsillectomy      Around 1994  . Tubal ligation    . Esophagogastroduodenoscopy N/A 09/16/2013    Procedure: ESOPHAGOGASTRODUODENOSCOPY (EGD);  Surgeon: Wonda Horner, MD;  Location: [facility];  Service: Endoscopy;  Laterality: N/A;  . Laparoscopy N/A 09/20/2013    Procedure: DIAGNOSTIC LAPAROSCOPY,DISTAL GASTRECTOMY AND  FEEDING GASTROJEJUNOSTOMY;  Surgeon: [name], MD;  Location: [facility];  Service: General;  Laterality: N/A;  . Portacath placement Left 11/18/2013    Procedure: INSERTION PORT-A-CATH;  Surgeon: [name], MD;  Location: [facility];  Service: General;  Laterality: Left;   Family History  Problem Relation Age of Onset  . Hypertension Mother   . Diabetes Mellitus II Mother   . Other Mother     hx of hysterectomy for unspecified reason  . Hypertension Sister   . Hypertension Brother   . Heart failure Maternal Grandmother   . Heart Problems Maternal Grandmother   . Hypertension Maternal Grandfather   . Diabetes Maternal Grandfather   . Colon cancer Paternal Uncle     dx. late 50s-early 60s  . Gastric cancer Cousin 53  . Colon cancer Cousin     dx. early-late 65s; (father also had colon cancer)  . Colon cancer Paternal Uncle     dx. late 50s-early 60s  . Lung cancer Paternal Uncle     dx. 67s; smoker  . Colon polyps Paternal Uncle     unknown number  . Heart attack Maternal Aunt   . Heart Problems Paternal Aunt   . Heart attack Paternal Grandfather   . Colon cancer Paternal Grandfather     dx. 50s-60s  . Colon polyps Brother     1 maternal half-brother had  approx 3 polyps  . Breast cancer Cousin 31  . Breast cancer Cousin     dx. early 73s or earlier (father had colon cancer)  . Lung cancer Other     PGF's brothers (x4); dx. later in life; lim info   Social History  Substance Use Topics  . Smoking status: Former Smoker -- 1.00 packs/day for 16 years    Quit date: 02/21/2002  . Smokeless tobacco: Never Used     Comment: 0.5-1.0 ppd for 16 yrs  . Alcohol Use: No     Comment: wine at dinner previously   OB History    No data available     Review of Systems  All other systems reviewed and are negative.     Allergies  Review of patient's allergies indicates no known allergies.  Home Medications   Prior to Admission medications   Medication Sig Start Date End  Date Taking? Authorizing Provider  carvedilol (COREG) 6.25 MG tablet Take 6.25 mg by mouth 2 (two) times daily with a meal.   Yes Historical Provider, MD  enoxaparin (LOVENOX) 60 MG/0.6ML injection Inject 0.5 mLs (50 mg total) into the skin daily. Patient taking differently: Inject 50 mg into the skin every 12 (twelve) hours.  12/08/14  Yes Curt Bears, MD  gabapentin (NEURONTIN) 300 MG capsule Take 2 capsules (600 mg total) by mouth at bedtime. 02/13/15  Yes Osborne Oman, MD  LORazepam (ATIVAN) 1 MG tablet Take 1 tablet (1 mg total) by mouth 3 (three) times daily as needed for anxiety. 01/28/15  Yes John Molpus, MD  metoCLOPramide (REGLAN) 5 MG tablet Take 5 mg by mouth 3 (three) times daily before meals.   Yes Historical Provider, MD  Multiple Vitamins-Minerals (MULTIVITAMIN & MINERAL PO) Take 1 tablet by mouth daily.   Yes Historical Provider, MD  promethazine (PHENERGAN) 25 MG tablet Take 1 tablet (25 mg total) by mouth every 6 (six) hours as needed for nausea or vomiting. 01/26/15  Yes Domenic Moras, PA-C   BP 172/112 mmHg  Pulse 112  Temp(Src) 98 F (36.7 C) (Oral)  Resp 20  Ht 5' (1.524 m)  Wt 52.164 kg  BMI 22.46 kg/m2  SpO2 100%  LMP 02/04/2015 (Within Days) Physical Exam  Constitutional: She appears well-developed and well-nourished. No distress.  HENT:  Head: Normocephalic and atraumatic.  Neck: Neck supple.  Cardiovascular: Normal rate and regular rhythm.   Pulmonary/Chest: Effort normal and breath sounds normal. No respiratory distress. She has no wheezes. She has no rales.  Abdominal: Soft. She exhibits no distension. There is generalized tenderness. There is no rebound and no guarding.  Initial exam somewhat limited, pt began actively vomiting when placed in supine position.   Neurological: She is alert.  Skin: She is not diaphoretic.  Nursing note and vitals reviewed.   ED Course  Procedures (including critical care time) Labs Review Labs Reviewed   COMPREHENSIVE METABOLIC PANEL - Abnormal; Notable for the following:    Glucose, Bld 147 (*)    Total Protein 8.3 (*)    All other components within normal limits  CBC WITH DIFFERENTIAL/PLATELET - Abnormal; Notable for the following:    Hemoglobin 10.8 (*)    HCT 33.9 (*)    Lymphs Abs 0.4 (*)    All other components within normal limits  LIPASE, BLOOD    Imaging Review Dg Abd Acute W/chest  02/18/2015  CLINICAL DATA:  Vomiting since yesterday.  Abdominal and back pain. EXAM: DG ABDOMEN ACUTE W/ 1V  CHEST COMPARISON:  Chest x-ray 01/12/2015.  CT 12/09/2014. FINDINGS: Left Port-A-Cath remains in place, unchanged. Heart is normal size. No confluent airspace opacities or effusions. Nonobstructive bowel gas pattern. No free air organomegaly. Surgical changes in the left abdomen. No acute bony abnormality. IMPRESSION: No evidence of bowel obstruction or free air. No acute cardiopulmonary disease. Electronically Signed   By: Rolm Baptise M.D.   On: 02/18/2015 13:29      EKG Interpretation   Date/Time:  Wednesday February 18 2015 11:46:43 EST Ventricular Rate:  94 PR Interval:  106 QRS Duration: 88 QT Interval:  342 QTC Calculation: 427 R Axis:   70 Text Interpretation:  Sinus rhythm with short PR with occasional Premature  ventricular complexes Moderate voltage criteria for LVH, may be normal  variant Nonspecific T wave abnormality Abnormal ECG Since previous tracing  PVCs are new Confirmed by [facility]  MD, MARTHA [id number]) on 02/18/2015 2:09:42  PM       4:01 PM Pt is feeling much better after medications, tolerating PO.    MDM   Final diagnoses:  Chronic abdominal pain  Non-intractable vomiting with nausea, vomiting of unspecified type   Afebrile nontoxic chronically ill appearing patient with chronic abdominal pain, N/V, under care of Dr Penelope Coop and referred to [facility] for further studies.  IVF, antiemetics, pain medication with great improvement.  Labs at baseline.  Abdominal exam is nonsurgical.  Pt denies any change from her chronic, recurrent pain.  After feeling better requested d/c home.  Is out of tramadol at home for pain.  Will follow up with GI.  Discussed result, findings, treatment, and follow up  with patient.  Pt given return precautions.  Pt verbalizes understanding and agrees with plan.        Clayton Bibles, PA-C 02/18/15 [place], MD 02/19/15 [phone]

## 2015-02-18 NOTE — ED Notes (Signed)
Abdominal pain started yesterday associated with nausea

## 2015-02-18 NOTE — ED Notes (Signed)
MD notified of BP and HR

## 2015-02-18 NOTE — ED Notes (Signed)
Patient transported to x-ray. ?

## 2015-02-19 ENCOUNTER — Ambulatory Visit: Payer: Medicaid Other

## 2015-02-23 ENCOUNTER — Ambulatory Visit: Payer: Medicaid Other

## 2015-02-25 ENCOUNTER — Ambulatory Visit: Payer: Medicaid Other

## 2015-02-25 ENCOUNTER — Ambulatory Visit: Payer: Self-pay

## 2015-02-27 ENCOUNTER — Ambulatory Visit (INDEPENDENT_AMBULATORY_CARE_PROVIDER_SITE_OTHER): Payer: Medicaid Other | Admitting: *Deleted

## 2015-02-27 ENCOUNTER — Encounter: Payer: Self-pay | Admitting: *Deleted

## 2015-02-27 VITALS — BP 108/66 | HR 77 | Temp 98.8°F | Wt 115.1 lb

## 2015-02-27 DIAGNOSIS — N951 Menopausal and female climacteric states: Secondary | ICD-10-CM

## 2015-02-27 DIAGNOSIS — Z3042 Encounter for surveillance of injectable contraceptive: Secondary | ICD-10-CM

## 2015-02-27 DIAGNOSIS — N924 Excessive bleeding in the premenopausal period: Secondary | ICD-10-CM

## 2015-03-04 ENCOUNTER — Ambulatory Visit: Payer: Medicaid Other

## 2015-03-04 ENCOUNTER — Telehealth: Payer: Self-pay

## 2015-03-04 NOTE — Telephone Encounter (Signed)
Pt requested refill on lovenox. Noted that #30 were prescribed 12/08/14. Also noted discrepancy on Rx of directions for daily and "pt takes differently-every 12 hours". Message forwarded to Dr [name].

## 2015-03-05 ENCOUNTER — Telehealth: Payer: Self-pay | Admitting: Medical Oncology

## 2015-03-05 ENCOUNTER — Other Ambulatory Visit: Payer: Self-pay | Admitting: Medical Oncology

## 2015-03-05 DIAGNOSIS — I2699 Other pulmonary embolism without acute cor pulmonale: Secondary | ICD-10-CM

## 2015-03-05 NOTE — Telephone Encounter (Signed)
Note to Paula [name]. 

## 2015-03-05 NOTE — Telephone Encounter (Signed)
I instructed Paula [name] that [name] will not refill ativan and to f/u with PCP.

## 2015-03-05 NOTE — Telephone Encounter (Signed)
Patient called today asking for Lovenox and lorazepam refill.  Will notify provider.

## 2015-03-06 ENCOUNTER — Telehealth: Payer: Self-pay | Admitting: *Deleted

## 2015-03-06 DIAGNOSIS — I2699 Other pulmonary embolism without acute cor pulmonale: Secondary | ICD-10-CM

## 2015-03-06 MED ORDER — ENOXAPARIN SODIUM 60 MG/0.6ML ~~LOC~~ SOLN: 50.0000 mg | Freq: Two times a day (BID) | SUBCUTANEOUS | Status: DC

## 2015-03-06 NOTE — Telephone Encounter (Signed)
Rx for Lovenox sent to [facility]. Called pt to notify.

## 2015-03-06 NOTE — Telephone Encounter (Signed)
Pt notified Rx sent 

## 2015-03-06 NOTE — Telephone Encounter (Signed)
-----   Message from [name], RN sent at 03/06/2015 11:12 AM EST ----- Regarding: FW: lvoenox refill For PE ----- Message -----    From: Curt Bears, MD    Sent: 03/05/2015  10:19 PM      To: [name], RN Subject: RE: lvoenox refill                             I do not remember why she is on Lovenox but if it was PE, OK to refill. ----- Message -----    From: [name], RN    Sent: 03/05/2015   1:45 PM      To: Curt Bears, MD Subject: lvoenox refill                                 Refill request by pt for Lovenox- [facility] [place]

## 2015-03-09 ENCOUNTER — Other Ambulatory Visit: Payer: Self-pay | Admitting: Radiology

## 2015-03-09 ENCOUNTER — Other Ambulatory Visit (HOSPITAL_COMMUNITY): Payer: Medicaid Other

## 2015-03-10 ENCOUNTER — Ambulatory Visit (HOSPITAL_COMMUNITY)
Admission: RE | Admit: 2015-03-10 | Discharge: 2015-03-10 | Disposition: A | Discharge status: Home / Self Care | Payer: Medicaid Other | Source: Ambulatory Visit | Attending: Internal Medicine | Admitting: Internal Medicine

## 2015-03-10 ENCOUNTER — Encounter (HOSPITAL_COMMUNITY): Payer: Self-pay

## 2015-03-10 DIAGNOSIS — Z800 Family history of malignant neoplasm of digestive organs: Secondary | ICD-10-CM | POA: Insufficient documentation

## 2015-03-10 DIAGNOSIS — N83209 Unspecified ovarian cyst, unspecified side: Secondary | ICD-10-CM | POA: Diagnosis not present

## 2015-03-10 DIAGNOSIS — M940 Chondrocostal junction syndrome [Tietze]: Secondary | ICD-10-CM | POA: Insufficient documentation

## 2015-03-10 DIAGNOSIS — Z87891 Personal history of nicotine dependence: Secondary | ICD-10-CM | POA: Insufficient documentation

## 2015-03-10 DIAGNOSIS — Z9221 Personal history of antineoplastic chemotherapy: Secondary | ICD-10-CM | POA: Diagnosis not present

## 2015-03-10 DIAGNOSIS — Z452 Encounter for adjustment and management of vascular access device: Secondary | ICD-10-CM | POA: Insufficient documentation

## 2015-03-10 DIAGNOSIS — Z85028 Personal history of other malignant neoplasm of stomach: Secondary | ICD-10-CM | POA: Diagnosis not present

## 2015-03-10 DIAGNOSIS — I10 Essential (primary) hypertension: Secondary | ICD-10-CM | POA: Diagnosis not present

## 2015-03-10 DIAGNOSIS — Z8249 Family history of ischemic heart disease and other diseases of the circulatory system: Secondary | ICD-10-CM | POA: Insufficient documentation

## 2015-03-10 DIAGNOSIS — Z86711 Personal history of pulmonary embolism: Secondary | ICD-10-CM | POA: Insufficient documentation

## 2015-03-10 DIAGNOSIS — G43909 Migraine, unspecified, not intractable, without status migrainosus: Secondary | ICD-10-CM | POA: Insufficient documentation

## 2015-03-10 DIAGNOSIS — K219 Gastro-esophageal reflux disease without esophagitis: Secondary | ICD-10-CM | POA: Insufficient documentation

## 2015-03-10 DIAGNOSIS — Z803 Family history of malignant neoplasm of breast: Secondary | ICD-10-CM | POA: Insufficient documentation

## 2015-03-10 DIAGNOSIS — Z801 Family history of malignant neoplasm of trachea, bronchus and lung: Secondary | ICD-10-CM | POA: Insufficient documentation

## 2015-03-10 DIAGNOSIS — R011 Cardiac murmur, unspecified: Secondary | ICD-10-CM | POA: Diagnosis not present

## 2015-03-10 DIAGNOSIS — C163 Malignant neoplasm of pyloric antrum: Secondary | ICD-10-CM

## 2015-03-10 LAB — BASIC METABOLIC PANEL
ANION GAP: 7 (ref 5–15)
BUN: 11 mg/dL (ref 6–20)
CALCIUM: 9.3 mg/dL (ref 8.9–10.3)
CO2: 25 mmol/L (ref 22–32)
CREATININE: 1.01 mg/dL — AB (ref 0.44–1.00)
Chloride: 108 mmol/L (ref 101–111)
Glucose, Bld: 89 mg/dL (ref 65–99)
Potassium: 4.3 mmol/L (ref 3.5–5.1)
SODIUM: 140 mmol/L (ref 135–145)

## 2015-03-10 LAB — CBC
HCT: 28.9 % — ABNORMAL LOW (ref 36.0–46.0)
Hemoglobin: 9.2 g/dL — ABNORMAL LOW (ref 12.0–15.0)
MCH: 28 pg (ref 26.0–34.0)
MCHC: 31.8 g/dL (ref 30.0–36.0)
MCV: 87.8 fL (ref 78.0–100.0)
PLATELETS: 185 10*3/uL (ref 150–400)
RBC: 3.29 MIL/uL — AB (ref 3.87–5.11)
RDW: 16.5 % — ABNORMAL HIGH (ref 11.5–15.5)
WBC: 2.4 10*3/uL — AB (ref 4.0–10.5)

## 2015-03-10 MED ORDER — FENTANYL CITRATE (PF) 100 MCG/2ML IJ SOLN
INTRAMUSCULAR | Status: AC
  Filled 2015-03-10: qty 4

## 2015-03-10 MED ORDER — LIDOCAINE HCL 1 % IJ SOLN
INTRAMUSCULAR | Status: AC
  Filled 2015-03-10: qty 20

## 2015-03-10 MED ORDER — MIDAZOLAM HCL 2 MG/2ML IJ SOLN
INTRAMUSCULAR | Status: AC | PRN
  Administered 2015-03-10 (×5): 1 mg via INTRAVENOUS

## 2015-03-10 MED ORDER — CARVEDILOL 6.25 MG PO TABS
6.2500 mg | ORAL_TABLET | Freq: Once | ORAL | Status: AC
  Administered 2015-03-10: 6.25 mg via ORAL
  Filled 2015-03-10: qty 1

## 2015-03-10 MED ORDER — SODIUM CHLORIDE 0.9 % IV SOLN: Freq: Once | INTRAVENOUS | Status: DC

## 2015-03-10 MED ORDER — FENTANYL CITRATE (PF) 100 MCG/2ML IJ SOLN
INTRAMUSCULAR | Status: AC | PRN
  Administered 2015-03-10: 25 ug via INTRAVENOUS
  Administered 2015-03-10: 50 ug via INTRAVENOUS
  Administered 2015-03-10: 25 ug via INTRAVENOUS

## 2015-03-10 MED ORDER — TRAMADOL HCL 50 MG PO TABS
50.0000 mg | ORAL_TABLET | Freq: Once | ORAL | Status: AC
  Administered 2015-03-10: 50 mg via ORAL
  Filled 2015-03-10: qty 1

## 2015-03-10 MED ORDER — CEFAZOLIN SODIUM-DEXTROSE 2-3 GM-% IV SOLR
INTRAVENOUS | Status: AC
  Administered 2015-03-10: 2 g via INTRAVENOUS
  Filled 2015-03-10: qty 50

## 2015-03-10 MED ORDER — CEFAZOLIN SODIUM-DEXTROSE 2-3 GM-% IV SOLR
2.0000 g | INTRAVENOUS | Status: AC
  Administered 2015-03-10: 2 g via INTRAVENOUS

## 2015-03-10 MED ORDER — MIDAZOLAM HCL 2 MG/2ML IJ SOLN
INTRAMUSCULAR | Status: AC
  Filled 2015-03-10: qty 6

## 2015-03-10 NOTE — Progress Notes (Signed)
Pt blood pressure climbing - most recent BP 161/90.  Pt states she is in significant pain and also did not take her Coreg today.  Paged Dr. Barbie Banner for orders. Coolidge Breeze, RN 03/10/2015  1415 - Repaged Dr. Barbie Banner, per Jonelle Sidle in radiology he was in a procedure. Call back received with new orders.  Pharmacy notified by phone to send meds to short stay. Pt updated.  Coolidge Breeze, RN 03/10/2015  1434 - Meds given.  Will monitor for 20 min per order and recheck blood pressure. Coolidge Breeze, RN 03/10/2015  1455 - BP decreased, pain still 7/10. Pt instructed to use tylenol and tramadol as prescribed for continuing pain, as well as ice pack. Pt verbalized understanding and was able to teach back. Coolidge Breeze, RN 03/10/2015

## 2015-03-10 NOTE — Procedures (Signed)
LIJV PAC removal No comp/EBL

## 2015-03-10 NOTE — Discharge Instructions (Signed)
Incision Care °An incision is when a surgeon cuts into your body. After surgery, the incision needs to be cared for properly to prevent infection.  °HOW TO CARE FOR YOUR INCISION °· Take medicines only as directed by your health care provider. °· There are many different ways to close and cover an incision, including stitches, skin glue, and adhesive strips. Follow your health care provider's instructions on: °¨ Incision care. °¨ Bandage (dressing) changes and removal. °¨ Incision closure removal. °· Do not take baths, swim, or use a hot tub until your health care provider approves. You may shower as directed by your health care provider. °· Resume your normal diet and activities as directed. °· Use anti-itch medicine (such as an antihistamine) as directed by your health care provider. The incision may itch while it is healing. Do not pick or scratch at the incision. °· Drink enough fluid to keep your urine clear or pale yellow. °SEEK MEDICAL CARE IF:  °· You have drainage, redness, swelling, or pain at your incision site. °· You have muscle aches, chills, or a general ill feeling. °· You notice a bad smell coming from the incision or dressing. °· Your incision edges separate after the sutures, staples, or skin adhesive strips have been removed. °· You have persistent nausea or vomiting. °· You have a fever. °· You are dizzy. °SEEK IMMEDIATE MEDICAL CARE IF:  °· You have a rash. °· You faint. °· You have difficulty breathing. °MAKE SURE YOU:  °· Understand these instructions. °· Will watch your condition. °· Will get help right away if you are not doing well or get worse. °  °This information is not intended to replace advice given to you by your health care provider. Make sure you discuss any questions you have with your health care provider. °  °Document Released: 08/27/2004 Document Revised: 02/28/2014 Document Reviewed: 04/03/2013 °Elsevier Interactive Patient Education ©2016 [employer] °Moderate Conscious  Sedation, Adult °Sedation is the use of medicines to promote relaxation and relieve discomfort and anxiety. Moderate conscious sedation is a type of sedation. Under moderate conscious sedation you are less alert than normal but are still able to respond to instructions or stimulation. Moderate conscious sedation is used during short medical and dental procedures. It is milder than deep sedation or general anesthesia and allows you to return to your regular activities sooner. °LET YOUR HEALTH CARE PROVIDER KNOW ABOUT:  °· Any allergies you have. °· All medicines you are taking, including vitamins, herbs, eye drops, creams, and over-the-counter medicines. °· Use of steroids (by mouth or creams). °· Previous problems you or members of your family have had with the use of anesthetics. °· Any blood disorders you have. °· Previous surgeries you have had. °· Medical conditions you have. °· Possibility of pregnancy, if this applies. °· Use of cigarettes, alcohol, or illegal drugs. °RISKS AND COMPLICATIONS °Generally, this is a safe procedure. However, as with any procedure, problems can occur. Possible problems include: °· Oversedation. °· Trouble breathing on your own. You may need to have a breathing tube until you are awake and breathing on your own. °· Allergic reaction to any of the medicines used for the procedure. °BEFORE THE PROCEDURE °· You may have blood tests done. These tests can help show how well your kidneys and liver are working. They can also show how well your blood clots. °· A physical exam will be done.   °· Only take medicines as directed by your health care provider. You may need   to stop taking medicines (such as blood thinners, aspirin, or nonsteroidal anti-inflammatory drugs) before the procedure.   °· Do not eat or drink at least 6 hours before the procedure or as directed by your health care provider. °· Arrange for a responsible adult, family member, or friend to take you home after the procedure.  He or she should stay with you for at least 24 hours after the procedure, until the medicine has worn off. °PROCEDURE  °· An intravenous (IV) catheter will be inserted into one of your veins. Medicine will be able to flow directly into your body through this catheter. You may be given medicine through this tube to help prevent pain and help you relax. °· The medical or dental procedure will be done. °AFTER THE PROCEDURE °· You will stay in a recovery area until the medicine has worn off. Your blood pressure and pulse will be checked.   °·  Depending on the procedure you had, you may be allowed to go home when you can tolerate liquids and your pain is under control. °  °This information is not intended to replace advice given to you by your health care provider. Make sure you discuss any questions you have with your health care provider. °  °Document Released: 11/02/2000 Document Revised: 02/28/2014 Document Reviewed: 10/15/2012 °Elsevier Interactive Patient Education ©2016 [employer]. ° °

## 2015-03-10 NOTE — H&P (Signed)
Chief Complaint: Patient was seen in consultation today for removal of Port A Cath at the request of [facility]  Referring Physician(s): Mohamed,Mohamed  History of Present Illness: Paula [name] is a 50 y.o. female with history of stomach cancer who is here today for removal of her Port A Cath.  She is happy to report that she has completed her chemotherapy and is requesting removal.  The Port was initially placed by Dr. Barry Dienes in September of 2015.  It worked well and she had no signs of infection.  She is NPO and she is not taking blood thinners.  She feels well today, no fever, chills, or recent illness.  Past Medical History  Diagnosis Date  . Headache(784.0) 2011    Migraines  . Esophageal reflux     On omeprazole  . Costochondritis   . Hypertension     started around 2011  . Ovarian cyst   . Stomach cancer ([place])   . Family history of cancer   . Heart murmur   . On antineoplastic chemotherapy     5-FU and leucovorin  . Pneumonia   . Pulmonary emboli [facility])     Past Surgical History  Procedure Laterality Date  . Oophorectomy      Around 1985 left ovary removal  . Tonsillectomy      Around 1994  . Tubal ligation    . Esophagogastroduodenoscopy N/A 09/16/2013    Procedure: ESOPHAGOGASTRODUODENOSCOPY (EGD);  Surgeon: Wonda Horner, MD;  Location: [facility];  Service: Endoscopy;  Laterality: N/A;  . Laparoscopy N/A 09/20/2013    Procedure: DIAGNOSTIC LAPAROSCOPY,DISTAL GASTRECTOMY AND FEEDING GASTROJEJUNOSTOMY;  Surgeon: [name], MD;  Location: [facility];  Service: General;  Laterality: N/A;  . Portacath placement Left 11/18/2013    Procedure: INSERTION PORT-A-CATH;  Surgeon: [name], MD;  Location: [facility];  Service: General;  Laterality: Left;    Allergies: Review of patient's allergies indicates no known allergies.  Medications: Prior to Admission medications   Medication Sig Start Date End Date Taking? Authorizing Provider    carvedilol (COREG) 6.25 MG tablet Take 6.25 mg by mouth 2 (two) times daily with a meal.   Yes Historical Provider, MD  LORazepam (ATIVAN) 1 MG tablet Take 1 tablet (1 mg total) by mouth 3 (three) times daily as needed for anxiety. 01/28/15  Yes John Molpus, MD  metoCLOPramide (REGLAN) 5 MG tablet Take 5 mg by mouth 3 (three) times daily before meals.   Yes Historical Provider, MD  Multiple Vitamins-Minerals (MULTIVITAMIN & MINERAL PO) Take 1 tablet by mouth daily.   Yes Historical Provider, MD  promethazine (PHENERGAN) 25 MG tablet Take 1 tablet (25 mg total) by mouth every 6 (six) hours as needed for nausea or vomiting. 01/26/15  Yes Domenic Moras, PA-C  traMADol (ULTRAM) 50 MG tablet Take 1 tablet (50 mg total) by mouth every 6 (six) hours as needed for moderate pain or severe pain. 02/18/15  Yes Clayton Bibles, PA-C  enoxaparin (LOVENOX) 60 MG/0.6ML injection Inject 0.5 mLs (50 mg total) into the skin every 12 (twelve) hours. 03/06/15   Curt Bears, MD  gabapentin (NEURONTIN) 300 MG capsule Take 2 capsules (600 mg total) by mouth at bedtime. 02/13/15   Osborne Oman, MD     Family History  Problem Relation Age of Onset  . Hypertension Mother   . Diabetes Mellitus II Mother   . Other Mother     hx of hysterectomy for unspecified reason  . Hypertension Sister   .  Hypertension Brother   . Heart failure Maternal Grandmother   . Heart Problems Maternal Grandmother   . Hypertension Maternal Grandfather   . Diabetes Maternal Grandfather   . Colon cancer Paternal Uncle     dx. late 50s-early 60s  . Gastric cancer Cousin 53  . Colon cancer Cousin     dx. early-late 34s; (father also had colon cancer)  . Colon cancer Paternal Uncle     dx. late 50s-early 60s  . Lung cancer Paternal Uncle     dx. 26s; smoker  . Colon polyps Paternal Uncle     unknown number  . Heart attack Maternal Aunt   . Heart Problems Paternal Aunt   . Heart attack Paternal Grandfather   . Colon cancer Paternal  Grandfather     dx. 50s-60s  . Colon polyps Brother     1 maternal half-brother had approx 3 polyps  . Breast cancer Cousin 56  . Breast cancer Cousin     dx. early 68s or earlier (father had colon cancer)  . Lung cancer Other     PGF's brothers (x4); dx. later in life; lim info    Social History   Social History  . Marital Status: Married    Spouse Name: N/A  . Number of Children: 2  . Years of Education: N/A   Occupational History  .  [place]   Social History Main Topics  . Smoking status: Former Smoker -- 1.00 packs/day for 16 years    Quit date: 02/21/2002  . Smokeless tobacco: Never Used     Comment: 0.5-1.0 ppd for 16 yrs  . Alcohol Use: No     Comment: wine at dinner previously  . Drug Use: No  . Sexual Activity: Not Asked   Other Topics Concern  . None   Social History Narrative    Review of Systems: A 12 point ROS discussed and pertinent positives are indicated in the HPI above.  All other systems are negative.  Review of Systems  Constitutional: Negative for fever, chills, activity change, appetite change and fatigue.  HENT: Negative.   Respiratory: Negative for cough and shortness of breath.   Cardiovascular: Negative for chest pain.  Gastrointestinal: Negative for nausea, vomiting and abdominal pain.  Genitourinary: Negative.   Musculoskeletal: Negative.   Skin: Negative.   Neurological: Negative.   Psychiatric/Behavioral: Negative.     Vital Signs: BP 131/79 mmHg  Pulse 71  Temp(Src) 98.7 F (37.1 C) (Oral)  Resp 18  Ht 5' (1.524 m)  Wt 112 lb 4 oz (50.916 kg)  BMI 21.92 kg/m2  SpO2 100%  LMP 02/04/2015 (Within Days)  Physical Exam  Constitutional: She is oriented to person, place, and time. She appears well-developed and well-nourished.  HENT:  Head: Normocephalic and atraumatic.  Eyes: EOM are normal.  Neck: Normal range of motion. Neck supple.  Cardiovascular: Normal rate, regular rhythm and normal heart sounds.     No murmur heard. Pulmonary/Chest: Effort normal and breath sounds normal. No respiratory distress. She has no wheezes.  Abdominal: Soft. Bowel sounds are normal. She exhibits no distension. There is no tenderness.  Musculoskeletal: Normal range of motion.  Neurological: She is alert and oriented to person, place, and time.  Skin: Skin is warm and dry.  Psychiatric: She has a normal mood and affect. Her behavior is normal. Judgment and thought content normal.  Vitals reviewed.   Mallampati Score:  MD Evaluation Airway: WNL Heart: WNL Abdomen: WNL Chest/ Lungs: WNL  ASA  Classification: 3 Mallampati/Airway Score: One  Imaging: Dg Abd Acute W/chest  02/18/2015  CLINICAL DATA:  Vomiting since yesterday.  Abdominal and back pain. EXAM: DG ABDOMEN ACUTE W/ 1V CHEST COMPARISON:  Chest x-ray 01/12/2015.  CT 12/09/2014. FINDINGS: Left Port-A-Cath remains in place, unchanged. Heart is normal size. No confluent airspace opacities or effusions. Nonobstructive bowel gas pattern. No free air organomegaly. Surgical changes in the left abdomen. No acute bony abnormality. IMPRESSION: No evidence of bowel obstruction or free air. No acute cardiopulmonary disease. Electronically Signed   By: Rolm Baptise M.D.   On: 02/18/2015 13:29    Labs:  CBC:  Recent Labs  01/26/15 1420 01/27/15 2239 02/18/15 1232 03/10/15 1023  WBC 3.7* 3.9* 4.2 2.4*  HGB 11.6* 11.1* 10.8* 9.2*  HCT 35.7* 34.5* 33.9* 28.9*  PLT 398 369 337 185    COAGS:  Recent Labs  12/09/14 1135  INR 0.96    BMP:  Recent Labs  01/26/15 1420 01/27/15 2239 02/18/15 1232 03/10/15 1023  NA 138 136 137 140  K 4.2 3.5 3.6 4.3  CL 105 102 105 108  CO2 25 25 24 25   GLUCOSE 136* 146* 147* 89  BUN 5* 6 6 11   CALCIUM 9.7 9.3 9.4 9.3  CREATININE 0.74 0.69 0.65 1.01*  GFRNONAA >60 >60 >60 >60  GFRAA >60 >60 >60 >60    LIVER FUNCTION TESTS:  Recent Labs  12/25/14 0948 01/12/15 1330 01/26/15 1420 02/18/15 1232   BILITOT 0.7 0.6 0.6 0.8  AST 19 22 24 21   ALT 19 20 23 17   ALKPHOS 80 88 108 89  PROT 7.6 7.8 8.1 8.3*  ALBUMIN 4.4 4.3 4.5 4.6    TUMOR MARKERS:  Recent Labs  05/12/14 0849  CEA 1.3    Assessment and Plan:  History of stomach cancer. Completion of chemotherapy  Requesting removal of Port A cath. Will proceed with that today by Dr. Barbie Banner.  Risks and Benefits discussed with the patient including, but not limited to bleeding, infection, pneumothorax, or fibrin sheath development and need for additional procedures.  All of the patient's questions were answered, patient is agreeable to proceed. Consent signed and in chart.  Thank you for this interesting consult.  I greatly enjoyed meeting Destanie A [name] and look forward to participating in their care.  A copy of this report was sent to the requesting provider on this date.  Electronically Signed: Murrell Redden PA-C 03/10/2015, 11:22 AM   I spent a total of  30 Minutes  in face to face in clinical consultation, greater than 50% of which was counseling/coordinating care for removal of Port A Cath

## 2015-03-11 ENCOUNTER — Other Ambulatory Visit (HOSPITAL_COMMUNITY): Payer: Self-pay | Admitting: Gastroenterology

## 2015-03-11 ENCOUNTER — Other Ambulatory Visit: Payer: Self-pay | Admitting: Internal Medicine

## 2015-03-11 ENCOUNTER — Other Ambulatory Visit: Payer: Self-pay | Admitting: Gastroenterology

## 2015-03-11 ENCOUNTER — Encounter (HOSPITAL_COMMUNITY): Payer: Self-pay

## 2015-03-11 DIAGNOSIS — C163 Malignant neoplasm of pyloric antrum: Secondary | ICD-10-CM

## 2015-03-11 DIAGNOSIS — R1111 Vomiting without nausea: Secondary | ICD-10-CM

## 2015-03-11 DIAGNOSIS — R112 Nausea with vomiting, unspecified: Secondary | ICD-10-CM

## 2015-03-18 ENCOUNTER — Ambulatory Visit
Admission: RE | Admit: 2015-03-18 | Discharge: 2015-03-18 | Disposition: A | Discharge status: Home / Self Care | Payer: Medicaid Other | Source: Ambulatory Visit | Attending: Obstetrics & Gynecology | Admitting: Obstetrics & Gynecology

## 2015-03-18 DIAGNOSIS — Z01419 Encounter for gynecological examination (general) (routine) without abnormal findings: Secondary | ICD-10-CM

## 2015-03-19 ENCOUNTER — Ambulatory Visit
Admission: RE | Admit: 2015-03-19 | Discharge: 2015-03-19 | Disposition: A | Discharge status: Home / Self Care | Payer: Medicaid Other | Source: Ambulatory Visit | Attending: Gastroenterology | Admitting: Gastroenterology

## 2015-03-19 ENCOUNTER — Ambulatory Visit (HOSPITAL_COMMUNITY)
Admission: RE | Admit: 2015-03-19 | Discharge: 2015-03-19 | Disposition: A | Discharge status: Home / Self Care | Payer: Medicaid Other | Source: Ambulatory Visit | Attending: Gastroenterology | Admitting: Gastroenterology

## 2015-03-19 DIAGNOSIS — R112 Nausea with vomiting, unspecified: Secondary | ICD-10-CM

## 2015-03-19 DIAGNOSIS — R1111 Vomiting without nausea: Secondary | ICD-10-CM

## 2015-03-19 MED ORDER — TECHNETIUM TC 99M MEBROFENIN IV KIT
4.8000 | PACK | Freq: Once | INTRAVENOUS | Status: AC | PRN
  Administered 2015-03-19: 5 via INTRAVENOUS

## 2015-03-20 ENCOUNTER — Encounter (HOSPITAL_BASED_OUTPATIENT_CLINIC_OR_DEPARTMENT_OTHER): Payer: Self-pay | Admitting: Emergency Medicine

## 2015-03-20 ENCOUNTER — Other Ambulatory Visit: Payer: Self-pay

## 2015-03-20 ENCOUNTER — Emergency Department (HOSPITAL_BASED_OUTPATIENT_CLINIC_OR_DEPARTMENT_OTHER)
Admission: EM | Admit: 2015-03-20 | Discharge: 2015-03-20 | Disposition: A | Discharge status: Home / Self Care | Payer: Medicaid Other | Attending: Emergency Medicine | Admitting: Emergency Medicine

## 2015-03-20 ENCOUNTER — Emergency Department (HOSPITAL_BASED_OUTPATIENT_CLINIC_OR_DEPARTMENT_OTHER): Payer: Medicaid Other

## 2015-03-20 DIAGNOSIS — F1210 Cannabis abuse, uncomplicated: Secondary | ICD-10-CM | POA: Diagnosis not present

## 2015-03-20 DIAGNOSIS — Z9221 Personal history of antineoplastic chemotherapy: Secondary | ICD-10-CM | POA: Insufficient documentation

## 2015-03-20 DIAGNOSIS — Z3202 Encounter for pregnancy test, result negative: Secondary | ICD-10-CM | POA: Insufficient documentation

## 2015-03-20 DIAGNOSIS — Z8719 Personal history of other diseases of the digestive system: Secondary | ICD-10-CM | POA: Insufficient documentation

## 2015-03-20 DIAGNOSIS — N9989 Other postprocedural complications and disorders of genitourinary system: Secondary | ICD-10-CM | POA: Diagnosis not present

## 2015-03-20 DIAGNOSIS — Z8742 Personal history of other diseases of the female genital tract: Secondary | ICD-10-CM | POA: Insufficient documentation

## 2015-03-20 DIAGNOSIS — Z79899 Other long term (current) drug therapy: Secondary | ICD-10-CM | POA: Insufficient documentation

## 2015-03-20 DIAGNOSIS — R109 Unspecified abdominal pain: Secondary | ICD-10-CM

## 2015-03-20 DIAGNOSIS — Z87891 Personal history of nicotine dependence: Secondary | ICD-10-CM | POA: Diagnosis not present

## 2015-03-20 DIAGNOSIS — R1011 Right upper quadrant pain: Secondary | ICD-10-CM | POA: Diagnosis present

## 2015-03-20 DIAGNOSIS — R011 Cardiac murmur, unspecified: Secondary | ICD-10-CM | POA: Insufficient documentation

## 2015-03-20 DIAGNOSIS — Z9889 Other specified postprocedural states: Secondary | ICD-10-CM | POA: Diagnosis not present

## 2015-03-20 DIAGNOSIS — G43A0 Cyclical vomiting, not intractable: Secondary | ICD-10-CM | POA: Diagnosis not present

## 2015-03-20 DIAGNOSIS — R1012 Left upper quadrant pain: Secondary | ICD-10-CM | POA: Insufficient documentation

## 2015-03-20 DIAGNOSIS — R1115 Cyclical vomiting syndrome unrelated to migraine: Secondary | ICD-10-CM

## 2015-03-20 DIAGNOSIS — Z8701 Personal history of pneumonia (recurrent): Secondary | ICD-10-CM | POA: Diagnosis not present

## 2015-03-20 DIAGNOSIS — I10 Essential (primary) hypertension: Secondary | ICD-10-CM | POA: Insufficient documentation

## 2015-03-20 DIAGNOSIS — Z86711 Personal history of pulmonary embolism: Secondary | ICD-10-CM | POA: Diagnosis not present

## 2015-03-20 DIAGNOSIS — G8929 Other chronic pain: Secondary | ICD-10-CM

## 2015-03-20 LAB — CBC WITH DIFFERENTIAL/PLATELET
BASOS ABS: 0 10*3/uL (ref 0.0–0.1)
Basophils Relative: 0 %
EOS ABS: 0 10*3/uL (ref 0.0–0.7)
EOS PCT: 0 %
HCT: 34.8 % — ABNORMAL LOW (ref 36.0–46.0)
HEMOGLOBIN: 11.2 g/dL — AB (ref 12.0–15.0)
LYMPHS ABS: 0.4 10*3/uL — AB (ref 0.7–4.0)
Lymphocytes Relative: 9 %
MCH: 27.9 pg (ref 26.0–34.0)
MCHC: 32.2 g/dL (ref 30.0–36.0)
MCV: 86.6 fL (ref 78.0–100.0)
MONOS PCT: 7 %
Monocytes Absolute: 0.3 10*3/uL (ref 0.1–1.0)
Neutro Abs: 3.8 10*3/uL (ref 1.7–7.7)
Neutrophils Relative %: 84 %
PLATELETS: 360 10*3/uL (ref 150–400)
RBC: 4.02 MIL/uL (ref 3.87–5.11)
RDW: 15.8 % — ABNORMAL HIGH (ref 11.5–15.5)
WBC: 4.5 10*3/uL (ref 4.0–10.5)

## 2015-03-20 LAB — COMPREHENSIVE METABOLIC PANEL
ALK PHOS: 79 U/L (ref 38–126)
ALT: 18 U/L (ref 14–54)
AST: 27 U/L (ref 15–41)
Albumin: 4.6 g/dL (ref 3.5–5.0)
Anion gap: 11 (ref 5–15)
BUN: 8 mg/dL (ref 6–20)
CALCIUM: 9.6 mg/dL (ref 8.9–10.3)
CHLORIDE: 104 mmol/L (ref 101–111)
CO2: 25 mmol/L (ref 22–32)
CREATININE: 0.82 mg/dL (ref 0.44–1.00)
Glucose, Bld: 157 mg/dL — ABNORMAL HIGH (ref 65–99)
Potassium: 3.4 mmol/L — ABNORMAL LOW (ref 3.5–5.1)
SODIUM: 140 mmol/L (ref 135–145)
Total Bilirubin: 0.9 mg/dL (ref 0.3–1.2)
Total Protein: 8 g/dL (ref 6.5–8.1)

## 2015-03-20 LAB — RAPID URINE DRUG SCREEN, HOSP PERFORMED
AMPHETAMINES: NOT DETECTED
BARBITURATES: NOT DETECTED
BENZODIAZEPINES: NOT DETECTED
COCAINE: NOT DETECTED
Opiates: NOT DETECTED
TETRAHYDROCANNABINOL: POSITIVE — AB

## 2015-03-20 LAB — LIPASE, BLOOD: Lipase: 16 U/L (ref 11–51)

## 2015-03-20 LAB — URINALYSIS, ROUTINE W REFLEX MICROSCOPIC
BILIRUBIN URINE: NEGATIVE
GLUCOSE, UA: NEGATIVE mg/dL
Ketones, ur: 15 mg/dL — AB
Leukocytes, UA: NEGATIVE
Nitrite: NEGATIVE
Protein, ur: 30 mg/dL — AB
SPECIFIC GRAVITY, URINE: 1.024 (ref 1.005–1.030)
pH: 5.5 (ref 5.0–8.0)

## 2015-03-20 LAB — URINE MICROSCOPIC-ADD ON

## 2015-03-20 LAB — PREGNANCY, URINE: Preg Test, Ur: NEGATIVE

## 2015-03-20 LAB — TROPONIN I: Troponin I: <0.03 ng/mL (ref <0.031)

## 2015-03-20 MED ORDER — HYDROMORPHONE HCL 1 MG/ML IJ SOLN
1.0000 mg | Freq: Once | INTRAMUSCULAR | Status: AC
Start: 2015-03-20 — End: 2015-03-20
  Administered 2015-03-20: 1 mg via INTRAVENOUS
  Filled 2015-03-20: qty 1

## 2015-03-20 MED ORDER — HALOPERIDOL LACTATE 5 MG/ML IJ SOLN
5.0000 mg | Freq: Once | INTRAMUSCULAR | Status: AC
  Administered 2015-03-20: 5 mg via INTRAVENOUS
  Filled 2015-03-20: qty 1

## 2015-03-20 MED ORDER — KETOROLAC TROMETHAMINE 30 MG/ML IJ SOLN
30.0000 mg | Freq: Once | INTRAMUSCULAR | Status: AC
  Administered 2015-03-20: 30 mg via INTRAVENOUS
  Filled 2015-03-20: qty 1

## 2015-03-20 MED ORDER — DICYCLOMINE HCL 10 MG/ML IM SOLN
20.0000 mg | Freq: Once | INTRAMUSCULAR | Status: AC
  Administered 2015-03-20: 20 mg via INTRAMUSCULAR
  Filled 2015-03-20: qty 2

## 2015-03-20 MED ORDER — HALOPERIDOL LACTATE 5 MG/ML IJ SOLN
2.0000 mg | Freq: Once | INTRAMUSCULAR | Status: AC
  Administered 2015-03-20: 2 mg via INTRAVENOUS
  Filled 2015-03-20: qty 1

## 2015-03-20 NOTE — ED Provider Notes (Signed)
CSN: [id number]     Arrival date & time 03/20/15  0242 History   First MD Initiated Contact with Patient 03/20/15 0255     Chief Complaint  Patient presents with  . Abdominal Pain     (Consider location/radiation/quality/duration/timing/severity/associated sxs/prior Treatment) Patient is a 50 y.o. female presenting with abdominal pain. The history is provided by the patient.  Abdominal Pain Pain location:  LUQ and RUQ Pain radiates to:  Does not radiate Pain severity:  Severe Onset quality:  Gradual Timing:  Constant Progression:  Unchanged Chronicity:  Chronic Context: previous surgery   Context: not trauma   Relieved by:  Nothing Worsened by:  Nothing tried Ineffective treatments:  None tried Associated symptoms: nausea and vomiting   Associated symptoms: no diarrhea and no fever   Risk factors: not pregnant   Pt with hx gastric cancer s/p partial gastrectomy p/w episode of what she describes as her chronic recurrent abdominal pain. States that approximately once a month she develops pain across her entire abdomen and back with associated N/V, which then causes her central chest to hurt. Has been vomiting since yesterday.  Has been evaluated for this multiple times before and states they cannot find a cause for her pain her evaluation includes multiple CT scans, an MRA, a NM gallbladder study.  GB normal no SMA syndrome no obstruction.    Past Medical History  Diagnosis Date  . Headache(784.0) 2011    Migraines  . Esophageal reflux     On omeprazole  . Costochondritis   . Hypertension     started around 2011  . Ovarian cyst   . Stomach cancer ([place])   . Family history of cancer   . Heart murmur   . On antineoplastic chemotherapy     5-FU and leucovorin  . Pneumonia   . Pulmonary emboli [facility])    Past Surgical History  Procedure Laterality Date  . Oophorectomy      Around 1985 left ovary removal  . Tonsillectomy      Around 1994  . Tubal ligation    .  Esophagogastroduodenoscopy N/A 09/16/2013    Procedure: ESOPHAGOGASTRODUODENOSCOPY (EGD);  Surgeon: Wonda Horner, MD;  Location: [facility];  Service: Endoscopy;  Laterality: N/A;  . Laparoscopy N/A 09/20/2013    Procedure: DIAGNOSTIC LAPAROSCOPY,DISTAL GASTRECTOMY AND FEEDING GASTROJEJUNOSTOMY;  Surgeon: [name], MD;  Location: [facility];  Service: General;  Laterality: N/A;  . Portacath placement Left 11/18/2013    Procedure: INSERTION PORT-A-CATH;  Surgeon: [name], MD;  Location[facility];  Service: General;  Laterality: Left;   Family History  Problem Relation Age of Onset  . Hypertension Mother   . Diabetes Mellitus II Mother   . Other Mother     hx of hysterectomy for unspecified reason  . Hypertension Sister   . Hypertension Brother   . Heart failure Maternal Grandmother   . Heart Problems Maternal Grandmother   . Hypertension Maternal Grandfather   . Diabetes Maternal Grandfather   . Colon cancer Paternal Uncle     dx. late 50s-early 60s  . Gastric cancer Cousin 51  . Colon cancer Cousin     dx. early-late 24s; (father also had colon cancer)  . Colon cancer Paternal Uncle     dx. late 50s-early 60s  . Lung cancer Paternal Uncle     dx. 35s; smoker  . Colon polyps Paternal Uncle     unknown number  . Heart attack Maternal Aunt   . Heart Problems  Paternal Aunt   . Heart attack Paternal Grandfather   . Colon cancer Paternal Grandfather     dx. 50s-60s  . Colon polyps Brother     1 maternal half-brother had approx 3 polyps  . Breast cancer Cousin 9  . Breast cancer Cousin     dx. early 56s or earlier (father had colon cancer)  . Lung cancer Other     PGF's brothers (x4); dx. later in life; lim info   Social History  Substance Use Topics  . Smoking status: Former Smoker -- 1.00 packs/day for 16 years    Quit date: 02/21/2002  . Smokeless tobacco: Never Used     Comment: 0.5-1.0 ppd for 16 yrs  . Alcohol Use: No     Comment: wine at dinner previously   OB  History    No data available     Review of Systems  Constitutional: Negative for fever.  Cardiovascular: Negative for palpitations and leg swelling.  Gastrointestinal: Positive for nausea, vomiting and abdominal pain. Negative for diarrhea.  All other systems reviewed and are negative.     Allergies  Review of patient's allergies indicates no known allergies.  Home Medications   Prior to Admission medications   Medication Sig Start Date End Date Taking? Authorizing Provider  carvedilol (COREG) 6.25 MG tablet Take 6.25 mg by mouth 2 (two) times daily with a meal.    Historical Provider, MD  enoxaparin (LOVENOX) 60 MG/0.6ML injection Inject 0.5 mLs (50 mg total) into the skin every 12 (twelve) hours. 03/06/15   Curt Bears, MD  gabapentin (NEURONTIN) 300 MG capsule Take 2 capsules (600 mg total) by mouth at bedtime. 02/13/15   Osborne Oman, MD  LORazepam (ATIVAN) 1 MG tablet Take 1 tablet (1 mg total) by mouth 3 (three) times daily as needed for anxiety. 01/28/15   John Molpus, MD  metoCLOPramide (REGLAN) 5 MG tablet Take 5 mg by mouth 3 (three) times daily before meals.    Historical Provider, MD  Multiple Vitamins-Minerals (MULTIVITAMIN & MINERAL PO) Take 1 tablet by mouth daily.    Historical Provider, MD  promethazine (PHENERGAN) 25 MG tablet Take 1 tablet (25 mg total) by mouth every 6 (six) hours as needed for nausea or vomiting. 01/26/15   Domenic Moras, PA-C  traMADol (ULTRAM) 50 MG tablet Take 1 tablet (50 mg total) by mouth every 6 (six) hours as needed for moderate pain or severe pain. 02/18/15   Clayton Bibles, PA-C   BP 170/111 mmHg  Pulse 108  Temp(Src) 98.8 F (37.1 C) (Oral)  Resp 18  Ht 5' (1.524 m)  Wt 113 lb (51.256 kg)  BMI 22.07 kg/m2  SpO2 100%  LMP 03/19/2015 Physical Exam  Constitutional: She is oriented to person, place, and time. She appears well-developed and well-nourished.  HENT:  Head: Normocephalic and atraumatic.  Eyes: Conjunctivae are normal.  Pupils are equal, round, and reactive to light.  Neck: Normal range of motion.  Cardiovascular: Normal rate, regular rhythm and intact distal pulses.   Pulmonary/Chest: Effort normal and breath sounds normal. No respiratory distress. She has no wheezes. She has no rales.  Abdominal: Soft. Bowel sounds are normal. She exhibits no distension and no mass. There is no tenderness. There is no rebound and no guarding.  Musculoskeletal: Normal range of motion.  Neurological: She is alert and oriented to person, place, and time.  Skin: Skin is warm and dry. She is not diaphoretic.  Psychiatric: She has a normal mood and  affect.    ED Course  Procedures (including critical care time) Labs Review Labs Reviewed  CBC WITH DIFFERENTIAL/PLATELET - Abnormal; Notable for the following:    Hemoglobin 11.2 (*)    HCT 34.8 (*)    RDW 15.8 (*)    Lymphs Abs 0.4 (*)    All other components within normal limits  COMPREHENSIVE METABOLIC PANEL - Abnormal; Notable for the following:    Potassium 3.4 (*)    Glucose, Bld 157 (*)    All other components within normal limits  URINALYSIS, ROUTINE W REFLEX MICROSCOPIC (NOT AT [facility]) - Abnormal; Notable for the following:    Hgb urine dipstick LARGE (*)    Ketones, ur 15 (*)    Protein, ur 30 (*)    All other components within normal limits  URINE RAPID DRUG SCREEN, HOSP PERFORMED - Abnormal; Notable for the following:    Tetrahydrocannabinol POSITIVE (*)    All other components within normal limits  URINE MICROSCOPIC-ADD ON - Abnormal; Notable for the following:    Squamous Epithelial / LPF 6-30 (*)    Bacteria, UA MANY (*)    All other components within normal limits  URINE CULTURE  TROPONIN I  LIPASE, BLOOD  PREGNANCY, URINE    Imaging Review US Abdomen Complete  03/19/2015  CLINICAL DATA:  Vomiting and nausea. EXAM: ABDOMEN ULTRASOUND COMPLETE COMPARISON:  None. FINDINGS: Gallbladder: No gallstones or wall thickening visualized. No sonographic Murphy  sign noted by sonographer. Common bile duct: Diameter: 2.4 mm Liver: No focal lesion identified. Within normal limits in parenchymal echogenicity. IVC: No abnormality visualized. Pancreas: Visualized portion unremarkable. Spleen: The spleen measures 3.7 cm which is normal in size. There is a small amount of adjacent ascites. Right Kidney: Length: 9 cm. Echogenicity within normal limits. No mass or hydronephrosis visualized. Left Kidney: Length: 9.8 cm. There is a 2.1 x 1.6 x 1.7 cm mass in the region of the left renal pelvis, not seen on the October 2016 CT scan. There is internal blood flow identified within the apparent mass. Abdominal aorta: No aneurysm visualized. Other findings: None. IMPRESSION: 1. Apparent 2.1 cm mass in the region of the left renal pelvis with internal blood flow, not seen on the CT scan from October 2016. This could be further evaluated with a CT scan. 2. Small amount of ascites adjacent to the spleen. These results will be called to the ordering clinician or representative by the Radiologist Assistant, and communication documented in the PACS or zVision Dashboard. Electronically Signed   By: Dorise Bullion III M.D   On: 03/19/2015 09:17   Nm Hepato W/eject Fract  03/19/2015  CLINICAL DATA:  Abdominal pain. EXAM: NUCLEAR MEDICINE HEPATOBILIARY IMAGING WITH GALLBLADDER EF TECHNIQUE: Sequential images of the abdomen were obtained out to 60 minutes following intravenous administration of radiopharmaceutical. After oral ingestion of Ensure, gallbladder ejection fraction was determined. At 60 min, normal ejection fraction is greater than 33%. It should be noted that the patient vomited approximately 33 minutes following ingestion Ensure and could not complete the entire study . RADIOPHARMACEUTICALS:  mCi Tc-54m  Choletec IV COMPARISON:  CT 12/09/2014 . FINDINGS: Liver, gallbladder, biliary system, and bowel visualize normally. Gallbladder ejection fraction: 93% at 33 minutes. Normal  gallbladder ejection fraction with Ensure is greater than 33%. IMPRESSION: Normal exam. Electronically Signed   By: Marcello Moores  Register   On: 03/19/2015 12:25   Dg Abd Acute W/chest  03/20/2015  CLINICAL DATA:  Acute onset of nausea and vomiting. Mid upper  abdominal pain and chest pain. Initial encounter. EXAM: DG ABDOMEN ACUTE W/ 1V CHEST COMPARISON:  Chest and abdominal radiographs performed 02/18/2015 FINDINGS: The lungs are well-aerated. Minimal right basilar atelectasis or scarring is noted. There is no evidence of pleural effusion or pneumothorax. The cardiomediastinal silhouette is within normal limits. The visualized bowel gas pattern is unremarkable. Bowel suture lines are noted about the left upper quadrant. Scattered stool and air are seen within the colon; there is no evidence of small bowel dilatation to suggest obstruction. No free intra-abdominal air is identified on the provided upright view. No acute osseous abnormalities are seen; the sacroiliac joints are unremarkable in appearance. IMPRESSION: 1. Unremarkable bowel gas pattern; no free intra-abdominal air seen. Small amount of stool noted in the colon. 2. Mild right basilar atelectasis or scarring noted. Lungs otherwise clear. Electronically Signed   By: Garald Balding M.D.   On: 03/20/2015 03:57   I have personally reviewed and evaluated these images and lab results as part of my medical decision-making.   EKG Interpretation   Date/Time:  Friday March 20 2015 03:04:22 EST Ventricular Rate:  101 PR Interval:  124 QRS Duration: 84 QT Interval:  332 QTC Calculation: 430 R Axis:   65 Text Interpretation:  Sinus tachycardia Left ventricular hypertrophy  Confirmed by [facility]  MD, Shameek Nyquist ([id number]) on 03/20/2015 3:25:32 AM      MDM   Final diagnoses:  Chronic abdominal pain  Cyclical vomiting, not intractable    Medications  haloperidol lactate (HALDOL) injection 5 mg (5 mg Intravenous Given 03/20/15 0318)  dicyclomine  (BENTYL) injection 20 mg (20 mg Intramuscular Given 03/20/15 0318)  ketorolac (TORADOL) 30 MG/ML injection 30 mg (30 mg Intravenous Given 03/20/15 0408)  HYDROmorphone (DILAUDID) injection 1 mg (1 mg Intravenous Given 03/20/15 0408)  haloperidol lactate (HALDOL) injection 2 mg (2 mg Intravenous Given 03/20/15 0408)    Have given one dose of pain medication and haldol as an antiemetic.  The retching has stopped.  The labs and imaging as well as all previous studies are normal with the exception of the marijuana in the patient's urine.  Exam is benign and reassuring.  With nurse present, EDP informed that patient that the marijuana in her system was the cause of her symptoms, and spoke at length to the patient about recent studies about marijuana and chronic abdominal pain and cyclic vomiting and given the recent battery of normal testing I believe this to be the cause of the patient's symptoms.  Patient again asked why this was happening and EDP explained the marijuana that she was using is the cause of the recurrent symptoms.  She was counseled at length to stop using marijuana and told that narcotics are not the treatment for same and that dilaudid and phenergan are not the treatments for this issue.  Patient is resting comfortably. She is advised we will not be doing multiple rounds of narcotics. As vomiting has stopped will PO challenge.  I suspect a component of drug seeking behavior and suspect patient will return because "only dilaudid and phenergan together" help her symptoms.      Veatrice Kells, MD 03/20/15 [phone]

## 2015-03-20 NOTE — ED Notes (Signed)
Patient with multiple episodes of non productive vomiting.

## 2015-03-20 NOTE — ED Notes (Signed)
Patient is no longer wretching at the bedside. Reports that she is feeling better

## 2015-03-20 NOTE — ED Notes (Signed)
Patient states that she started having N/V since about 1200 in the afternoon. Patient now has pain to her abdominal area and chest pain

## 2015-03-21 LAB — URINE CULTURE: Culture: NO GROWTH

## 2015-03-31 DIAGNOSIS — Z86711 Personal history of pulmonary embolism: Secondary | ICD-10-CM | POA: Insufficient documentation

## 2015-04-24 ENCOUNTER — Emergency Department (HOSPITAL_BASED_OUTPATIENT_CLINIC_OR_DEPARTMENT_OTHER)
Admission: EM | Admit: 2015-04-24 | Discharge: 2015-04-24 | Disposition: A | Discharge status: Home / Self Care | Payer: Medicaid Other | Attending: Physician Assistant | Admitting: Physician Assistant

## 2015-04-24 ENCOUNTER — Emergency Department (HOSPITAL_BASED_OUTPATIENT_CLINIC_OR_DEPARTMENT_OTHER): Payer: Medicaid Other

## 2015-04-24 ENCOUNTER — Encounter (HOSPITAL_BASED_OUTPATIENT_CLINIC_OR_DEPARTMENT_OTHER): Payer: Self-pay | Admitting: *Deleted

## 2015-04-24 DIAGNOSIS — R109 Unspecified abdominal pain: Secondary | ICD-10-CM | POA: Diagnosis not present

## 2015-04-24 DIAGNOSIS — Z86711 Personal history of pulmonary embolism: Secondary | ICD-10-CM | POA: Diagnosis not present

## 2015-04-24 DIAGNOSIS — Z79899 Other long term (current) drug therapy: Secondary | ICD-10-CM | POA: Insufficient documentation

## 2015-04-24 DIAGNOSIS — Z85028 Personal history of other malignant neoplasm of stomach: Secondary | ICD-10-CM | POA: Diagnosis not present

## 2015-04-24 DIAGNOSIS — Z8701 Personal history of pneumonia (recurrent): Secondary | ICD-10-CM | POA: Insufficient documentation

## 2015-04-24 DIAGNOSIS — R079 Chest pain, unspecified: Secondary | ICD-10-CM | POA: Diagnosis not present

## 2015-04-24 DIAGNOSIS — I10 Essential (primary) hypertension: Secondary | ICD-10-CM | POA: Diagnosis not present

## 2015-04-24 DIAGNOSIS — Z8742 Personal history of other diseases of the female genital tract: Secondary | ICD-10-CM | POA: Diagnosis not present

## 2015-04-24 DIAGNOSIS — R509 Fever, unspecified: Secondary | ICD-10-CM | POA: Diagnosis not present

## 2015-04-24 DIAGNOSIS — Z9851 Tubal ligation status: Secondary | ICD-10-CM | POA: Insufficient documentation

## 2015-04-24 DIAGNOSIS — M545 Low back pain: Secondary | ICD-10-CM | POA: Diagnosis not present

## 2015-04-24 DIAGNOSIS — R112 Nausea with vomiting, unspecified: Secondary | ICD-10-CM | POA: Diagnosis present

## 2015-04-24 DIAGNOSIS — Z3202 Encounter for pregnancy test, result negative: Secondary | ICD-10-CM | POA: Insufficient documentation

## 2015-04-24 DIAGNOSIS — Z87891 Personal history of nicotine dependence: Secondary | ICD-10-CM | POA: Diagnosis not present

## 2015-04-24 DIAGNOSIS — K219 Gastro-esophageal reflux disease without esophagitis: Secondary | ICD-10-CM | POA: Diagnosis not present

## 2015-04-24 DIAGNOSIS — R011 Cardiac murmur, unspecified: Secondary | ICD-10-CM | POA: Diagnosis not present

## 2015-04-24 LAB — COMPREHENSIVE METABOLIC PANEL
ALBUMIN: 4.5 g/dL (ref 3.5–5.0)
ALT: 17 U/L (ref 14–54)
AST: 22 U/L (ref 15–41)
Alkaline Phosphatase: 72 U/L (ref 38–126)
Anion gap: 9 (ref 5–15)
BUN: 8 mg/dL (ref 6–20)
CHLORIDE: 103 mmol/L (ref 101–111)
CO2: 25 mmol/L (ref 22–32)
CREATININE: 0.75 mg/dL (ref 0.44–1.00)
Calcium: 9.2 mg/dL (ref 8.9–10.3)
GFR calc Af Amer: >60 mL/min (ref >60)
GFR calc non Af Amer: >60 mL/min (ref >60)
GLUCOSE: 156 mg/dL — AB (ref 65–99)
POTASSIUM: 4.3 mmol/L (ref 3.5–5.1)
SODIUM: 137 mmol/L (ref 135–145)
Total Bilirubin: 0.8 mg/dL (ref 0.3–1.2)
Total Protein: 7.6 g/dL (ref 6.5–8.1)

## 2015-04-24 LAB — CBC WITH DIFFERENTIAL/PLATELET
BASOS ABS: 0 10*3/uL (ref 0.0–0.1)
BASOS PCT: 0 %
EOS PCT: 0 %
Eosinophils Absolute: 0 10*3/uL (ref 0.0–0.7)
HCT: 34.6 % — ABNORMAL LOW (ref 36.0–46.0)
Hemoglobin: 11.3 g/dL — ABNORMAL LOW (ref 12.0–15.0)
LYMPHS PCT: 16 %
Lymphs Abs: 0.6 10*3/uL — ABNORMAL LOW (ref 0.7–4.0)
MCH: 28.8 pg (ref 26.0–34.0)
MCHC: 32.7 g/dL (ref 30.0–36.0)
MCV: 88 fL (ref 78.0–100.0)
MONO ABS: 0.2 10*3/uL (ref 0.1–1.0)
Monocytes Relative: 6 %
Neutro Abs: 3.1 10*3/uL (ref 1.7–7.7)
Neutrophils Relative %: 78 %
PLATELETS: 334 10*3/uL (ref 150–400)
RBC: 3.93 MIL/uL (ref 3.87–5.11)
RDW: 14.9 % (ref 11.5–15.5)
WBC: 4 10*3/uL (ref 4.0–10.5)

## 2015-04-24 LAB — URINALYSIS, ROUTINE W REFLEX MICROSCOPIC
Bilirubin Urine: NEGATIVE
GLUCOSE, UA: NEGATIVE mg/dL
Ketones, ur: 15 mg/dL — AB
LEUKOCYTES UA: NEGATIVE
Nitrite: NEGATIVE
PH: 7 (ref 5.0–8.0)
PROTEIN: NEGATIVE mg/dL
SPECIFIC GRAVITY, URINE: 1.014 (ref 1.005–1.030)

## 2015-04-24 LAB — URINE MICROSCOPIC-ADD ON

## 2015-04-24 LAB — TROPONIN I

## 2015-04-24 LAB — LIPASE, BLOOD: LIPASE: 13 U/L (ref 11–51)

## 2015-04-24 LAB — PREGNANCY, URINE: Preg Test, Ur: NEGATIVE

## 2015-04-24 MED ORDER — SODIUM CHLORIDE 0.9 % IV BOLUS (SEPSIS)
1000.0000 mL | Freq: Once | INTRAVENOUS | Status: AC
  Administered 2015-04-24: 1000 mL via INTRAVENOUS

## 2015-04-24 MED ORDER — HYDROCODONE-ACETAMINOPHEN 5-325 MG PO TABS: 1.0000 | ORAL_TABLET | ORAL | Status: DC | PRN

## 2015-04-24 MED ORDER — HYDROMORPHONE HCL 1 MG/ML IJ SOLN
1.0000 mg | Freq: Once | INTRAMUSCULAR | Status: AC
  Administered 2015-04-24: 1 mg via INTRAVENOUS
  Filled 2015-04-24: qty 1

## 2015-04-24 MED ORDER — PROMETHAZINE HCL 25 MG PO TABS: 25.0000 mg | ORAL_TABLET | Freq: Four times a day (QID) | ORAL | Status: DC | PRN

## 2015-04-24 MED ORDER — PROMETHAZINE HCL 25 MG/ML IJ SOLN
25.0000 mg | Freq: Once | INTRAMUSCULAR | Status: AC
  Administered 2015-04-24: 25 mg via INTRAVENOUS
  Filled 2015-04-24: qty 1

## 2015-04-24 MED FILL — PROMETHAZINE 25 MG TABLET: 25 | 3 days supply | Qty: 12 | Fill #0

## 2015-04-24 MED FILL — HYDROCODON-APAP 5-325: 5-325 | 2 days supply | Qty: 6 | Fill #0

## 2015-04-24 NOTE — ED Provider Notes (Signed)
CSN: [id number]     Arrival date & time 04/24/15  1119 History   First MD Initiated Contact with Patient 04/24/15 1140     Chief Complaint  Patient presents with  . Emesis  . Fever  . Chest Pain    HPI   Paula [name] is a 50 y.o. female with a PMH of HTN, gastric cancer s/p partial gastrectomy who presents to the ED with abdominal pain, nausea, and vomiting. She states her symptoms started last night and have been constant since that time. She denies exacerbating or alleviating factors. She states she has been unable to tolerate PO intake due to her symptoms. She states vomiting makes her feel like her chest hurts. She also notes diffuse back pain. She states she experiences these same symptoms at least one time per month, and she denies change in the character of her symptoms. She denies fever, though reports chills. She denies shortness of breath, hematemesis, diarrhea, constipation, hematochezia, melena, dysuria, urgency, frequency.   Past Medical History  Diagnosis Date  . Headache(784.0) 2011    Migraines  . Esophageal reflux     On omeprazole  . Costochondritis   . Hypertension     started around 2011  . Ovarian cyst   . Stomach cancer ([place])   . Family history of cancer   . Heart murmur   . On antineoplastic chemotherapy     5-FU and leucovorin  . Pneumonia   . Pulmonary emboli [facility])    Past Surgical History  Procedure Laterality Date  . Oophorectomy      Around 1985 left ovary removal  . Tonsillectomy      Around 1994  . Tubal ligation    . Esophagogastroduodenoscopy N/A 09/16/2013    Procedure: ESOPHAGOGASTRODUODENOSCOPY (EGD);  Surgeon: Wonda Horner, MD;  Location: [facility];  Service: Endoscopy;  Laterality: N/A;  . Laparoscopy N/A 09/20/2013    Procedure: DIAGNOSTIC LAPAROSCOPY,DISTAL GASTRECTOMY AND FEEDING GASTROJEJUNOSTOMY;  Surgeon: [name], MD;  Location: [facility];  Service: General;  Laterality: N/A;  . Portacath placement Left 11/18/2013     Procedure: INSERTION PORT-A-CATH;  Surgeon: [name], MD;  Location: [facility];  Service: General;  Laterality: Left;   Family History  Problem Relation Age of Onset  . Hypertension Mother   . Diabetes Mellitus II Mother   . Other Mother     hx of hysterectomy for unspecified reason  . Hypertension Sister   . Hypertension Brother   . Heart failure Maternal Grandmother   . Heart Problems Maternal Grandmother   . Hypertension Maternal Grandfather   . Diabetes Maternal Grandfather   . Colon cancer Paternal Uncle     dx. late 50s-early 60s  . Gastric cancer Cousin 5  . Colon cancer Cousin     dx. early-late 72s; (father also had colon cancer)  . Colon cancer Paternal Uncle     dx. late 50s-early 60s  . Lung cancer Paternal Uncle     dx. 63s; smoker  . Colon polyps Paternal Uncle     unknown number  . Heart attack Maternal Aunt   . Heart Problems Paternal Aunt   . Heart attack Paternal Grandfather   . Colon cancer Paternal Grandfather     dx. 50s-60s  . Colon polyps Brother     1 maternal half-brother had approx 3 polyps  . Breast cancer Cousin 31  . Breast cancer Cousin     dx. early 65s or earlier (father had colon cancer)  .  Lung cancer Other     PGF's brothers (x4); dx. later in life; lim info   Social History  Substance Use Topics  . Smoking status: Former Smoker -- 1.00 packs/day for 16 years    Quit date: 02/21/2002  . Smokeless tobacco: Never Used     Comment: 0.5-1.0 ppd for 16 yrs  . Alcohol Use: No     Comment: wine at dinner previously   OB History    No data available      Review of Systems  Constitutional: Positive for chills. Negative for fever.  Respiratory: Negative for shortness of breath.   Cardiovascular: Positive for chest pain.  Gastrointestinal: Positive for nausea, vomiting and abdominal pain. Negative for diarrhea, constipation and blood in stool.  Genitourinary: Negative for dysuria, urgency and frequency.  Musculoskeletal: Positive for  back pain.  All other systems reviewed and are negative.     Allergies  Review of patient's allergies indicates no known allergies.  Home Medications   Prior to Admission medications   Medication Sig Start Date End Date Taking? Authorizing Provider  carvedilol (COREG) 6.25 MG tablet Take 6.25 mg by mouth 2 (two) times daily with a meal.   Yes Historical Provider, MD  enoxaparin (LOVENOX) 60 MG/0.6ML injection Inject 0.5 mLs (50 mg total) into the skin every 12 (twelve) hours. 03/06/15  Yes Curt Bears, MD  Multiple Vitamins-Minerals (MULTIVITAMIN & MINERAL PO) Take 1 tablet by mouth daily.   Yes Historical Provider, MD  HYDROcodone-acetaminophen (NORCO/VICODIN) 5-325 MG tablet Take 1-2 tablets by mouth every 4 (four) hours as needed. 04/24/15   Marella Chimes, PA-C  LORazepam (ATIVAN) 1 MG tablet Take 1 tablet (1 mg total) by mouth 3 (three) times daily as needed for anxiety. 01/28/15   John Molpus, MD  promethazine (PHENERGAN) 25 MG tablet Take 1 tablet (25 mg total) by mouth every 6 (six) hours as needed for nausea or vomiting. 04/24/15   Gu[place]upe Sabin Deantre Bourdon, PA-C    BP 125/80 mmHg  Pulse 80  Temp(Src) 98.3 F (36.8 C) (Oral)  Resp 18  Ht 5' (1.524 m)  Wt 49.896 kg  BMI 21.48 kg/m2  SpO2 100% Physical Exam  Constitutional: She is oriented to person, place, and time. She appears well-developed and well-nourished. No distress.  Patient appears uncomfortable due to pain.  HENT:  Head: Normocephalic and atraumatic.  Right Ear: External ear normal.  Left Ear: External ear normal.  Nose: Nose normal.  Mouth/Throat: Uvula is midline, oropharynx is clear and moist and mucous membranes are normal.  Eyes: Conjunctivae, EOM and lids are normal. Pupils are equal, round, and reactive to light. Right eye exhibits no discharge. Left eye exhibits no discharge. No scleral icterus.  Neck: Normal range of motion. Neck supple.  Cardiovascular: Normal rate, regular rhythm, normal heart  sounds, intact distal pulses and normal pulses.   Pulmonary/Chest: Effort normal and breath sounds normal. No respiratory distress. She has no wheezes. She has no rales.  Abdominal: Soft. Normal appearance and bowel sounds are normal. She exhibits no distension and no mass. There is tenderness. There is no rigidity, no rebound and no guarding.  Diffuse TTP. No rebound, guarding, or masses.  Musculoskeletal: Normal range of motion. She exhibits tenderness. She exhibits no edema.  Diffuse TTP to thoracic and lumbar paraspinal muscles. No midline tenderness, step-off or deformity.  Neurological: She is alert and oriented to person, place, and time. She has normal strength. No cranial nerve deficit or sensory deficit.  Skin: Skin  is warm, dry and intact. No rash noted. She is not diaphoretic. No erythema. No pallor.  Psychiatric: She has a normal mood and affect. Her speech is normal and behavior is normal.  Nursing note and vitals reviewed.   ED Course  Procedures (including critical care time)  Labs Review Labs Reviewed  CBC WITH DIFFERENTIAL/PLATELET - Abnormal; Notable for the following:    Hemoglobin 11.3 (*)    HCT 34.6 (*)    Lymphs Abs 0.6 (*)    All other components within normal limits  COMPREHENSIVE METABOLIC PANEL - Abnormal; Notable for the following:    Glucose, Bld 156 (*)    All other components within normal limits  URINALYSIS, ROUTINE W REFLEX MICROSCOPIC (NOT AT [facility]) - Abnormal; Notable for the following:    APPearance CLOUDY (*)    Hgb urine dipstick SMALL (*)    Ketones, ur 15 (*)    All other components within normal limits  URINE MICROSCOPIC-ADD ON - Abnormal; Notable for the following:    Squamous Epithelial / LPF 0-5 (*)    Bacteria, UA MANY (*)    All other components within normal limits  TROPONIN I  LIPASE, BLOOD  PREGNANCY, URINE    Imaging Review Dg Chest 2 View  04/24/2015  CLINICAL DATA:  Chest pain.  History of gastric  carcinoma EXAM: CHEST  2  VIEW COMPARISON:  March 20, 2015 FINDINGS: There is minimal scarring in the lateral right base. Lungs elsewhere clear. Heart size and pulmonary vascularity are normal. No adenopathy. No pneumothorax. No bone lesions. IMPRESSION: Slight stable scarring lateral right base. No edema or consolidation. Electronically Signed   By: Lowella Grip III M.D.   On: 04/24/2015 13:15   I have personally reviewed and evaluated these images and lab results as part of my medical decision-making.   EKG Interpretation   Date/Time:  Friday April 24 2015 12:20:30 EST Ventricular Rate:  120 PR Interval:  135 QRS Duration: 73 QT Interval:  316 QTC Calculation: 446 R Axis:   74 Text Interpretation:  Sinus tachycardia Ventricular premature complex  Aberrant complex Consider right atrial enlargement Probable LVH with  secondary repol abnrm no acute ischemia. Confirmed by Gerald Leitz  [phone]) on 04/24/2015 12:58:33 PM      MDM   Final diagnoses:  Non-intractable vomiting with nausea, vomiting of unspecified type  Abdominal pain, unspecified abdominal location    50 year old female presents with abdominal pain, nausea, and vomiting since last night. Also notes chest pain, which she attributes to vomiting, and back pain. States she experiences these same symptoms monthly. Denies hematemesis, diarrhea, constipation, hematochezia, melena, dysuria, urgency, frequency. Per record review, patient has been evaluated in the ED several time for this, and follows with Dr. Penelope Coop and was given a referral to [facility] for further management of her symptoms. Per care everywhere, patient evaluated at [facility] in January, at which time she was advised to follow liquid diet and take phenergan during these episodes. She was also referred for imaging. She had an MRI that was negative for focal or systemic vascular disease and there was no evidence of SMA syndrome.  Patient is afebrile. Vital signs stable.  Heart RRR. Lungs clear bilaterally. Abdomen soft, non-distended, with diffuse TTP. No rebound, guarding, or masses. TTP to thoracic and lumbar paraspinal muscles. No midline tenderness, step-off, or deformity.  Labs and imaging pending. Patient given fluids, pain medication, antiemetic.  On reassessment of patient, she reports significant symptom  improvement and is resting comfortably. EKG sinus tachycardia, HR 120 (on my assessment, patient HR in 90s). Troponin negative. CXR remarkable for slight stable scarring lateral right base, no edema or consolidation. CBC negative for leukocytosis, hemoglobin 11.3, which appears stable from baseline. CMP unremarkable. Lipase within normal limits. Urine pregnancy negative. UA negative for infecrtion.  Patient is non-toxic and well-appearing. Feel she is stable for discharge at this time. Patient to follow-up with her gastroenterologist. Will give phenergan and short course of pain medication for home. Strict return precautions discussed. Patient verbalizes her understanding and is in agreement with plan.  BP 125/80 mmHg  Pulse 80  Temp(Src) 98.3 F (36.8 C) (Oral)  Resp 18  Ht 5' (1.524 m)  Wt 49.896 kg  BMI 21.48 kg/m2  SpO2 100%    Marella Chimes, PA-C 04/24/15 [place], MD 04/27/15 1502

## 2015-04-24 NOTE — Discharge Instructions (Signed)
1. Medications: phenergan, pain medicine, usual home medications 2. Treatment: rest, drink plenty of fluids 3. Follow Up: please followup with your GI doctor as scheduled for discussion of your diagnoses and further evaluation after today's visit; please return to the ER for new or worsening symptoms   Abdominal Pain, Adult Many things can cause abdominal pain. Usually, abdominal pain is not caused by a disease and will improve without treatment. It can often be observed and treated at home. Your health care provider will do a physical exam and possibly order blood tests and X-rays to help determine the seriousness of your pain. However, in many cases, more time must pass before a clear cause of the pain can be found. Before that point, your health care provider may not know if you need more testing or further treatment. HOME CARE INSTRUCTIONS Monitor your abdominal pain for any changes. The following actions may help to alleviate any discomfort you are experiencing:  Only take over-the-counter or prescription medicines as directed by your health care provider.  Do not take laxatives unless directed to do so by your health care provider.  Try a clear liquid diet (broth, tea, or water) as directed by your health care provider. Slowly move to a bland diet as tolerated. SEEK MEDICAL CARE IF:  You have unexplained abdominal pain.  You have abdominal pain associated with nausea or diarrhea.  You have pain when you urinate or have a bowel movement.  You experience abdominal pain that wakes you in the night.  You have abdominal pain that is worsened or improved by eating food.  You have abdominal pain that is worsened with eating fatty foods.  You have a fever. SEEK IMMEDIATE MEDICAL CARE IF:  Your pain does not go away within 2 hours.  You keep throwing up (vomiting).  Your pain is felt only in portions of the abdomen, such as the right side or the left lower portion of the  abdomen.  You pass bloody or black tarry stools. MAKE SURE YOU:  Understand these instructions.  Will watch your condition.  Will get help right away if you are not doing well or get worse.   This information is not intended to replace advice given to you by your health care provider. Make sure you discuss any questions you have with your health care provider.   Document Released: 11/17/2004 Document Revised: 10/29/2014 Document Reviewed: 10/17/2012 Elsevier Interactive Patient Education 2016 Else[employer]   Nausea and Vomiting Nausea is a sick feeling that often comes before throwing up (vomiting). Vomiting is a reflex where stomach contents come out of your mouth. Vomiting can cause severe loss of body fluids (dehydration). Children and elderly adults can become dehydrated quickly, especially if they also have diarrhea. Nausea and vomiting are symptoms of a condition or disease. It is important to find the cause of your symptoms. CAUSES   Direct irritation of the stomach lining. This irritation can result from increased acid production (gastroesophageal reflux disease), infection, food poisoning, taking certain medicines (such as nonsteroidal anti-inflammatory drugs), alcohol use, or tobacco use.  Signals from the brain.These signals could be caused by a headache, heat exposure, an inner ear disturbance, increased pressure in the brain from injury, infection, a tumor, or a concussion, pain, emotional stimulus, or metabolic problems.  An obstruction in the gastrointestinal tract (bowel obstruction).  Illnesses such as diabetes, hepatitis, gallbladder problems, appendicitis, kidney problems, cancer, sepsis, atypical symptoms of a heart attack, or eating disorders.  Medical treatments such  as chemotherapy and radiation.  Receiving medicine that makes you sleep (general anesthetic) during surgery. DIAGNOSIS Your caregiver may ask for tests to be done if the problems do not improve  after a few days. Tests may also be done if symptoms are severe or if the reason for the nausea and vomiting is not clear. Tests may include:  Urine tests.  Blood tests.  Stool tests.  Cultures (to look for evidence of infection).  X-rays or other imaging studies. Test results can help your caregiver make decisions about treatment or the need for additional tests. TREATMENT You need to stay well hydrated. Drink frequently but in small amounts.You may wish to drink water, sports drinks, clear broth, or eat frozen ice pops or gelatin dessert to help stay hydrated.When you eat, eating slowly may help prevent nausea.There are also some antinausea medicines that may help prevent nausea. HOME CARE INSTRUCTIONS   Take all medicine as directed by your caregiver.  If you do not have an appetite, do not force yourself to eat. However, you must continue to drink fluids.  If you have an appetite, eat a normal diet unless your caregiver tells you differently.  Eat a variety of complex carbohydrates (rice, wheat, potatoes, bread), lean meats, yogurt, fruits, and vegetables.  Avoid high-fat foods because they are more difficult to digest.  Drink enough water and fluids to keep your urine clear or pale yellow.  If you are dehydrated, ask your caregiver for specific rehydration instructions. Signs of dehydration may include:  Severe thirst.  Dry lips and mouth.  Dizziness.  Dark urine.  Decreasing urine frequency and amount.  Confusion.  Rapid breathing or pulse. SEEK IMMEDIATE MEDICAL CARE IF:   You have blood or brown flecks (like coffee grounds) in your vomit.  You have black or bloody stools.  You have a severe headache or stiff neck.  You are confused.  You have severe abdominal pain.  You have chest pain or trouble breathing.  You do not urinate at least once every 8 hours.  You develop cold or clammy skin.  You continue to vomit for longer than 24 to 48  hours.  You have a fever. MAKE SURE YOU:   Understand these instructions.  Will watch your condition.  Will get help right away if you are not doing well or get worse.   This information is not intended to replace advice given to you by your health care provider. Make sure you discuss any questions you have with your health care provider.   Document Released: 02/07/2005 Document Revised: 05/02/2011 Document Reviewed: 07/07/2010 Elsevier Interactive Patient Education [employer].

## 2015-04-24 NOTE — ED Notes (Signed)
Per pt report started vomiting last night, core chest and pain in chest and lower back. Unable to keep food down.

## 2015-04-29 ENCOUNTER — Ambulatory Visit: Payer: Medicaid Other | Admitting: Obstetrics and Gynecology

## 2015-04-30 ENCOUNTER — Ambulatory Visit (INDEPENDENT_AMBULATORY_CARE_PROVIDER_SITE_OTHER): Payer: Medicaid Other | Admitting: Obstetrics and Gynecology

## 2015-04-30 ENCOUNTER — Encounter: Payer: Self-pay | Admitting: Obstetrics and Gynecology

## 2015-04-30 VITALS — BP 122/82 | HR 82 | Temp 98.5°F | Ht 62.0 in | Wt 104.9 lb

## 2015-04-30 DIAGNOSIS — N951 Menopausal and female climacteric states: Secondary | ICD-10-CM

## 2015-04-30 MED ORDER — VENLAFAXINE HCL 37.5 MG PO TABS: 37.5000 mg | ORAL_TABLET | Freq: Two times a day (BID) | ORAL | Status: DC

## 2015-04-30 NOTE — Patient Instructions (Addendum)
Leuprolide depot injection What is this medicine? LEUPROLIDE (loo PROE lide) is a man-made protein that acts like a natural hormone in the body. It decreases testosterone in men and decreases estrogen in women. In men, this medicine is used to treat advanced prostate cancer. In women, some forms of this medicine may be used to treat endometriosis, uterine fibroids, or other female hormone-related problems. This medicine may be used for other purposes; ask your health care provider or pharmacist if you have questions. What should I tell my health care provider before I take this medicine? They need to know if you have any of these conditions: -diabetes -heart disease or previous heart attack -high blood pressure -high cholesterol -osteoporosis -pain or difficulty passing urine -spinal cord metastasis -stroke -tobacco smoker -unusual vaginal bleeding (women) -an unusual or allergic reaction to leuprolide, benzyl alcohol, other medicines, foods, dyes, or preservatives -pregnant or trying to get pregnant -breast-feeding How should I use this medicine? This medicine is for injection into a muscle or for injection under the skin. It is given by a health care professional in a hospital or clinic setting. The specific product will determine how it will be given to you. Make sure you understand which product you receive and how often you will receive it. Talk to your pediatrician regarding the use of this medicine in children. Special care may be needed. Overdosage: If you think you have taken too much of this medicine contact a poison control center or emergency room at once. NOTE: This medicine is only for you. Do not share this medicine with others. What if I miss a dose? It is important not to miss a dose. Call your doctor or health care professional if you are unable to keep an appointment. Depot injections: Depot injections are given either once-monthly, every 12 weeks, every 16 weeks, or  every 24 weeks depending on the product you are prescribed. The product you are prescribed will be based on if you are female or female, and your condition. Make sure you understand your product and dosing. What may interact with this medicine? Do not take this medicine with any of the following medications: -chasteberry This medicine may also interact with the following medications: -herbal or dietary supplements, like black cohosh or DHEA -female hormones, like estrogens or progestins and birth control pills, patches, rings, or injections -female hormones, like testosterone This list may not describe all possible interactions. Give your health care provider a list of all the medicines, herbs, non-prescription drugs, or dietary supplements you use. Also tell them if you smoke, drink alcohol, or use illegal drugs. Some items may interact with your medicine. What should I watch for while using this medicine? Visit your doctor or health care professional for regular checks on your progress. During the first weeks of treatment, your symptoms may get worse, but then will improve as you continue your treatment. You may get hot flashes, increased bone pain, increased difficulty passing urine, or an aggravation of nerve symptoms. Discuss these effects with your doctor or health care professional, some of them may improve with continued use of this medicine. Female patients may experience a menstrual cycle or spotting during the first months of therapy with this medicine. If this continues, contact your doctor or health care professional. What side effects may I notice from receiving this medicine? Side effects that you should report to your doctor or health care professional as soon as possible: -allergic reactions like skin rash, itching or hives, swelling of the   face, lips, or tongue -breathing problems -chest pain -depression or memory disorders -pain in your legs or groin -pain at site where injected or  implanted -severe headache -swelling of the feet and legs -visual changes -vomiting Side effects that usually do not require medical attention (report to your doctor or health care professional if they continue or are bothersome): -breast swelling or tenderness -decrease in sex drive or performance -diarrhea -hot flashes -loss of appetite -muscle, joint, or bone pains -nausea -redness or irritation at site where injected or implanted -skin problems or acne This list may not describe all possible side effects. Call your doctor for medical advice about side effects. You may report side effects to FDA at 1-800-FDA-1088. Where should I keep my medicine? This drug is given in a hospital or clinic and will not be stored at home. NOTE: This sheet is a summary. It may not cover all possible information. If you have questions about this medicine, talk to your doctor, pharmacist, or health care provider.    2016, Elsevier/Gold Standard. (2013-11-01 14:16:23) Nausea and Vomiting Nausea is a sick feeling that often comes before throwing up (vomiting). Vomiting is a reflex where stomach contents come out of your mouth. Vomiting can cause severe loss of body fluids (dehydration). Children and elderly adults can become dehydrated quickly, especially if they also have diarrhea. Nausea and vomiting are symptoms of a condition or disease. It is important to find the cause of your symptoms. CAUSES   Direct irritation of the stomach lining. This irritation can result from increased acid production (gastroesophageal reflux disease), infection, food poisoning, taking certain medicines (such as nonsteroidal anti-inflammatory drugs), alcohol use, or tobacco use.  Signals from the brain.These signals could be caused by a headache, heat exposure, an inner ear disturbance, increased pressure in the brain from injury, infection, a tumor, or a concussion, pain, emotional stimulus, or metabolic problems.  An  obstruction in the gastrointestinal tract (bowel obstruction).  Illnesses such as diabetes, hepatitis, gallbladder problems, appendicitis, kidney problems, cancer, sepsis, atypical symptoms of a heart attack, or eating disorders.  Medical treatments such as chemotherapy and radiation.  Receiving medicine that makes you sleep (general anesthetic) during surgery. DIAGNOSIS Your caregiver may ask for tests to be done if the problems do not improve after a few days. Tests may also be done if symptoms are severe or if the reason for the nausea and vomiting is not clear. Tests may include:  Urine tests.  Blood tests.  Stool tests.  Cultures (to look for evidence of infection).  X-rays or other imaging studies. Test results can help your caregiver make decisions about treatment or the need for additional tests. TREATMENT You need to stay well hydrated. Drink frequently but in small amounts.You may wish to drink water, sports drinks, clear broth, or eat frozen ice pops or gelatin dessert to help stay hydrated.When you eat, eating slowly may help prevent nausea.There are also some antinausea medicines that may help prevent nausea. HOME CARE INSTRUCTIONS   Take all medicine as directed by your caregiver.  If you do not have an appetite, do not force yourself to eat. However, you must continue to drink fluids.  If you have an appetite, eat a normal diet unless your caregiver tells you differently.  Eat a variety of complex carbohydrates (rice, wheat, potatoes, bread), lean meats, yogurt, fruits, and vegetables.  Avoid high-fat foods because they are more difficult to digest.  Drink enough water and fluids to keep your urine clear or  pale yellow.  If you are dehydrated, ask your caregiver for specific rehydration instructions. Signs of dehydration may include:  Severe thirst.  Dry lips and mouth.  Dizziness.  Dark urine.  Decreasing urine frequency and  amount.  Confusion.  Rapid breathing or pulse. SEEK IMMEDIATE MEDICAL CARE IF:   You have blood or brown flecks (like coffee grounds) in your vomit.  You have black or bloody stools.  You have a severe headache or stiff neck.  You are confused.  You have severe abdominal pain.  You have chest pain or trouble breathing.  You do not urinate at least once every 8 hours.  You develop cold or clammy skin.  You continue to vomit for longer than 24 to 48 hours.  You have a fever. MAKE SURE YOU:   Understand these instructions.  Will watch your condition.  Will get help right away if you are not doing well or get worse.   This information is not intended to replace advice given to you by your health care provider. Make sure you discuss any questions you have with your health care provider.   Document Released: 02/07/2005 Document Revised: 05/02/2011 Document Reviewed: 07/07/2010 Elsevier Interactive Patient Education [employer].

## 2015-04-30 NOTE — Progress Notes (Signed)
Patient ID: Paula [name], female   DOB: 1965-09-05, 50 y.o.   MRN: [id number] 50 yo perimenopausal presenting today for the evaluation of cyclical nausea and emesis. Patient believes that it is associated with her menstrual cycle and is requesting a hysterectomy. Patient has been on depo-provera for the past year and reports minimal monthly vaginal bleeding while on it. She also reports being admitted to the hospital due to nausea and vomiting monthly for the past year. This is not new to her as she experienced these cyclical cycle in [id number] when gastric carcinoma was discovered and treated with chemo and radiation. Patient also reports vasomotor symptoms which she has not been treated with the prescribed Neurotin. Patient reports some constipation. She is disappointed that she is unable to gain 20 pounds a year after her surgery  Past Medical History  Diagnosis Date  . Headache(784.0) 2011    Migraines  . Esophageal reflux     On omeprazole  . Costochondritis   . Hypertension     started around 2011  . Ovarian cyst   . Stomach cancer ([place])   . Family history of cancer   . Heart murmur   . On antineoplastic chemotherapy     5-FU and leucovorin  . Pneumonia   . Pulmonary emboli [facility])    Past Surgical History  Procedure Laterality Date  . Oophorectomy      Around 1985 left ovary removal  . Tonsillectomy      Around 1994  . Tubal ligation    . Esophagogastroduodenoscopy N/A 09/16/2013    Procedure: ESOPHAGOGASTRODUODENOSCOPY (EGD);  Surgeon: Wonda Horner, MD;  Location: [facility];  Service: Endoscopy;  Laterality: N/A;  . Laparoscopy N/A 09/20/2013    Procedure: DIAGNOSTIC LAPAROSCOPY,DISTAL GASTRECTOMY AND FEEDING GASTROJEJUNOSTOMY;  Surgeon: [name], MD;  Location: [facility];  Service: General;  Laterality: N/A;  . Portacath placement Left 11/18/2013    Procedure: INSERTION PORT-A-CATH;  Surgeon: [name], MD;  Location: [facility];  Service: General;  Laterality: Left;    Family History  Problem Relation Age of Onset  . Hypertension Mother   . Diabetes Mellitus II Mother   . Other Mother     hx of hysterectomy for unspecified reason  . Hypertension Sister   . Hypertension Brother   . Heart failure Maternal Grandmother   . Heart Problems Maternal Grandmother   . Hypertension Maternal Grandfather   . Diabetes Maternal Grandfather   . Colon cancer Paternal Uncle     dx. late 50s-early 60s  . Gastric cancer Cousin 35  . Colon cancer Cousin     dx. early-late 90s; (father also had colon cancer)  . Colon cancer Paternal Uncle     dx. late 50s-early 60s  . Lung cancer Paternal Uncle     dx. 107s; smoker  . Colon polyps Paternal Uncle     unknown number  . Heart attack Maternal Aunt   . Heart Problems Paternal Aunt   . Heart attack Paternal Grandfather   . Colon cancer Paternal Grandfather     dx. 50s-60s  . Colon polyps Brother     1 maternal half-brother had approx 3 polyps  . Breast cancer Cousin 27  . Breast cancer Cousin     dx. early 46s or earlier (father had colon cancer)  . Lung cancer Other     PGF's brothers (x4); dx. later in life; lim info   Social History  Substance Use Topics  . Smoking status: Former Smoker --  1.00 packs/day for 16 years    Quit date: 02/21/2002  . Smokeless tobacco: Never Used     Comment: 0.5-1.0 ppd for 16 yrs  . Alcohol Use: No     Comment: wine at dinner previously   ROS See pertinent in HPI Blood pressure 122/82, pulse 82, temperature 98.5 F (36.9 C), temperature source Oral, height 5\' 2"  (1.575 m), weight 104 lb 14.4 oz (47.582 kg).  GENERAL: Well-developed, well-nourished female in no acute distress. Petite ABDOMEN: Soft, nontender, nondistended. No organomegaly. EXTREMITIES: No cyanosis, clubbing, or edema, 2+ distal pulses.  A/P 50 yo perimenopausal with cyclical nausea and emesis - Discussed why a hysterectomy is not indicated at this time as it is unlikely the source of her nausea and  emesis. Patient continued to experience cyclical nausea and emesis during her amenorrheic period while on chemo/radiation - Discussed medical management of her symptoms - Discussed management of her constipation as well - Advised patient to discuss realistic expectations in weight gain s/p partial gastrectomy - Discussed depo-lupron to mimic surgical menopause to see if nausea and emesis resolves. Patient desires to think about it - Continue depo-provera for now - Rx effexor provided to help with vasomotor symptoms - RTC prn

## 2015-05-04 ENCOUNTER — Telehealth: Payer: Self-pay | Admitting: Internal Medicine

## 2015-05-04 NOTE — Telephone Encounter (Signed)
returned call and s.w. pt and r/s lab time pt ok and aware

## 2015-05-06 ENCOUNTER — Other Ambulatory Visit: Payer: Self-pay | Admitting: Medical Oncology

## 2015-05-06 ENCOUNTER — Other Ambulatory Visit: Payer: Self-pay

## 2015-05-06 ENCOUNTER — Telehealth: Payer: Self-pay | Admitting: Internal Medicine

## 2015-05-06 ENCOUNTER — Telehealth: Payer: Self-pay | Admitting: Medical Oncology

## 2015-05-06 ENCOUNTER — Other Ambulatory Visit (HOSPITAL_BASED_OUTPATIENT_CLINIC_OR_DEPARTMENT_OTHER): Payer: Medicaid Other

## 2015-05-06 DIAGNOSIS — C169 Malignant neoplasm of stomach, unspecified: Secondary | ICD-10-CM

## 2015-05-06 LAB — COMPREHENSIVE METABOLIC PANEL
ALT: 20 U/L (ref 0–55)
AST: 20 U/L (ref 5–34)
Albumin: 4 g/dL (ref 3.5–5.0)
Alkaline Phosphatase: 73 U/L (ref 40–150)
Anion Gap: 8 mEq/L (ref 3–11)
BILIRUBIN TOTAL: 0.71 mg/dL (ref 0.20–1.20)
BUN: 8.9 mg/dL (ref 7.0–26.0)
CO2: 24 meq/L (ref 22–29)
CREATININE: 1 mg/dL (ref 0.6–1.1)
Calcium: 9.4 mg/dL (ref 8.4–10.4)
Chloride: 106 mEq/L (ref 98–109)
EGFR: 76 mL/min/{1.73_m2} — AB (ref >90)
GLUCOSE: 135 mg/dL (ref 70–140)
Potassium: 4.3 mEq/L (ref 3.5–5.1)
SODIUM: 139 meq/L (ref 136–145)
TOTAL PROTEIN: 7.2 g/dL (ref 6.4–8.3)

## 2015-05-06 LAB — CBC WITH DIFFERENTIAL/PLATELET
BASO%: 1.8 % (ref 0.0–2.0)
Basophils Absolute: 0.1 10*3/uL (ref 0.0–0.1)
EOS%: 2.2 % (ref 0.0–7.0)
Eosinophils Absolute: 0.1 10*3/uL (ref 0.0–0.5)
HCT: 33.5 % — ABNORMAL LOW (ref 34.8–46.6)
HGB: 10.9 g/dL — ABNORMAL LOW (ref 11.6–15.9)
LYMPH%: 23.2 % (ref 14.0–49.7)
MCH: 28.5 pg (ref 25.1–34.0)
MCHC: 32.5 g/dL (ref 31.5–36.0)
MCV: 87.7 fL (ref 79.5–101.0)
MONO#: 0.2 10*3/uL (ref 0.1–0.9)
MONO%: 8.3 % (ref 0.0–14.0)
NEUT%: 64.5 % (ref 38.4–76.8)
NEUTROS ABS: 1.8 10*3/uL (ref 1.5–6.5)
Platelets: 295 10*3/uL (ref 145–400)
RBC: 3.82 10*6/uL (ref 3.70–5.45)
RDW: 15.1 % — ABNORMAL HIGH (ref 11.2–14.5)
WBC: 2.8 10*3/uL — AB (ref 3.9–10.3)
lymph#: 0.6 10*3/uL — ABNORMAL LOW (ref 0.9–3.3)

## 2015-05-06 NOTE — Telephone Encounter (Signed)
I left a message that her lab specimen ( CEA) needs to be redrawn -hemolyzed-and to call back and let us know if she wants to come back today or another day this week for a redraw so results will be available for f/u visit.

## 2015-05-06 NOTE — Telephone Encounter (Signed)
Per 03/15 POF, added labs for 03/16 pt is aware of the 11:00 time... KJ

## 2015-05-06 NOTE — Progress Notes (Signed)
Pt agrees to come back tomorrow for lab redraw after neuro appt.Onc Tx request sent

## 2015-05-07 ENCOUNTER — Other Ambulatory Visit: Payer: Medicaid Other

## 2015-05-07 ENCOUNTER — Other Ambulatory Visit: Payer: Self-pay | Admitting: Medical Oncology

## 2015-05-07 ENCOUNTER — Telehealth: Payer: Self-pay | Admitting: *Deleted

## 2015-05-07 DIAGNOSIS — C169 Malignant neoplasm of stomach, unspecified: Secondary | ICD-10-CM

## 2015-05-07 NOTE — Telephone Encounter (Signed)
Patient left message asking if she can come get her lab as soon as she finishes with the urologist.  Called patient back.  She states she did not know about 11am lab appt and "did not agree to it."   It is 9:53am and she is at the urologist next door.  She is still waiting for the doctor to see her.  Let her know she is welcome to come after she finishes there and if she is early, they will try and get her early if they have a cancellation.  However, since she still has not seen the doctor there it may be close to her appointment time before she gets here.  She appreciated the phone call back.

## 2015-05-08 LAB — CEA (PARALLEL TESTING): CEA: 1.5 ng/mL

## 2015-05-08 LAB — CEA: CEA: 2.5 ng/mL (ref 0.0–4.7)

## 2015-05-11 ENCOUNTER — Telehealth: Payer: Self-pay | Admitting: Internal Medicine

## 2015-05-11 ENCOUNTER — Ambulatory Visit (HOSPITAL_BASED_OUTPATIENT_CLINIC_OR_DEPARTMENT_OTHER): Payer: Medicaid Other | Admitting: Internal Medicine

## 2015-05-11 ENCOUNTER — Encounter: Payer: Self-pay | Admitting: Internal Medicine

## 2015-05-11 VITALS — BP 119/78 | HR 81 | Temp 98.4°F | Resp 18 | Ht 62.0 in | Wt 107.0 lb

## 2015-05-11 DIAGNOSIS — R52 Pain, unspecified: Secondary | ICD-10-CM

## 2015-05-11 DIAGNOSIS — I82409 Acute embolism and thrombosis of unspecified deep veins of unspecified lower extremity: Secondary | ICD-10-CM | POA: Diagnosis not present

## 2015-05-11 DIAGNOSIS — I2699 Other pulmonary embolism without acute cor pulmonale: Secondary | ICD-10-CM | POA: Diagnosis not present

## 2015-05-11 DIAGNOSIS — R112 Nausea with vomiting, unspecified: Secondary | ICD-10-CM | POA: Insufficient documentation

## 2015-05-11 DIAGNOSIS — C169 Malignant neoplasm of stomach, unspecified: Secondary | ICD-10-CM

## 2015-05-11 DIAGNOSIS — R110 Nausea: Secondary | ICD-10-CM | POA: Diagnosis not present

## 2015-05-11 DIAGNOSIS — C163 Malignant neoplasm of pyloric antrum: Secondary | ICD-10-CM

## 2015-05-11 MED ORDER — RIVAROXABAN (XARELTO) VTE STARTER PACK (15 & 20 MG): ORAL_TABLET | ORAL | Status: DC

## 2015-05-11 MED ORDER — OLANZAPINE 10 MG PO TABS: 10.0000 mg | ORAL_TABLET | Freq: Every day | ORAL | Status: DC

## 2015-05-11 NOTE — Telephone Encounter (Signed)
Gave and printed appt sched and avs for pt for Sept...the patient did not want barium at this time will come back later

## 2015-05-11 NOTE — Progress Notes (Signed)
[place] [phone]   Fax:([phone]  OFFICE PROGRESS NOTE  Pcp Not In System No address on file  DIAGNOSIS: Stage IIA (T1a, N2, M0) gastric adenocarcinoma diagnosed in July of 2015  PRIOR THERAPY: 1) Status post diagnostic laparoscopy, distal gastrectomy with billroth II anastamosis and feeding gastrojejunostomy tube under the care of Dr. Barry Dienes on 09/20/2013. 2) Adjuvant systemic chemotherapy with 5-FU 600 mg/M2 with leucovorin on days 1, 8, 15, 22, 29 and 36 every 8 weeks. First dose 11/19/2013. 3) Adjuvant concurrent chemoradiation with Xeloda 650 MG/M2 by mouth twice a day for 5 days every week with radiation. The patient completed the course of radiotherapy but she declined to take Xeloda during her radiation.  CURRENT THERAPY: None.  INTERVAL HISTORY: Paula [name] 50 y.o. female returns to the clinic today for followup visit accompanied by her husband. The patient has been on observation for more than a year. She still complaining of intermittent nausea and vomiting. She was evaluated in different health system in the last few months including [facility], at [facility] as well as [facility]. There was no remarkable findings on her previous evaluation. She was also seen by her gastroenterologist Dr. Penelope Coop. She had MRI of the abdomen in January 2017 that showed no evidence for disease recurrence. She was referred by Dr. Penelope Coop total gastroenterologist at [facility] for further evaluation. She is also scheduled to see Dr. Barry Dienes her surgeon for reevaluation. She denied having any significant fever or chills, but has occasional nausea with no vomiting. She denied having any significant shortness of breath or hemoptysis. She has no significant weight loss or night sweats. She had repeat blood work and she is here for evaluation and discussion of her lab results.  MEDICAL HISTORY: Past Medical History  Diagnosis Date   . Headache(784.0) 2011    Migraines  . Esophageal reflux     On omeprazole  . Costochondritis   . Hypertension     started around 2011  . Ovarian cyst   . Stomach cancer ([place])   . Family history of cancer   . Heart murmur   . On antineoplastic chemotherapy     5-FU and leucovorin  . Pneumonia   . Pulmonary emboli ([facility])     ALLERGIES:  has No Known Allergies.  MEDICATIONS:  Current Outpatient Prescriptions  Medication Sig Dispense Refill  . carvedilol (COREG) 6.25 MG tablet Take 6.25 mg by mouth 2 (two) times daily with a meal.    . enoxaparin (LOVENOX) 60 MG/0.6ML injection Inject 0.5 mLs (50 mg total) into the skin every 12 (twelve) hours. 30 Syringe 1  . Multiple Vitamins-Minerals (MULTIVITAMIN & MINERAL PO) Take 1 tablet by mouth daily.    . promethazine (PHENERGAN) 12.5 MG tablet Take 12.5 mg by mouth every 6 (six) hours as needed.  2  . [DISCONTINUED] pantoprazole (PROTONIX) 40 MG tablet Take 1 tablet (40 mg total) by mouth 2 (two) times daily before a meal. (Patient not taking: Reported on 07/18/2014) 60 tablet 3  . [DISCONTINUED] temazepam (RESTORIL) 30 MG capsule Take 1 capsule (30 mg total) by mouth at bedtime as needed for sleep. (Patient not taking: Reported on 07/18/2014) 30 capsule 0   Current Facility-Administered Medications  Medication Dose Route Frequency Provider Last Rate Last Dose  . medroxyPROGESTERone (DEPO-PROVERA) injection 150 mg  150 mg Intramuscular Q90 days Osborne Oman, MD   150 mg at 02/27/15 1102  SURGICAL HISTORY:  Past Surgical History  Procedure Laterality Date  . Oophorectomy      Around 1985 left ovary removal  . Tonsillectomy      Around 1994  . Tubal ligation    . Esophagogastroduodenoscopy N/A 09/16/2013    Procedure: ESOPHAGOGASTRODUODENOSCOPY (EGD);  Surgeon: Wonda Horner, MD;  Location: [facility];  Service: Endoscopy;  Laterality: N/A;  . Laparoscopy N/A 09/20/2013    Procedure: DIAGNOSTIC LAPAROSCOPY,DISTAL GASTRECTOMY  AND FEEDING GASTROJEJUNOSTOMY;  Surgeon: [name], MD;  Location: [facility];  Service: General;  Laterality: N/A;  . Portacath placement Left 11/18/2013    Procedure: INSERTION PORT-A-CATH;  Surgeon: [name], MD;  Location: [facility];  Service: General;  Laterality: Left;    REVIEW OF SYSTEMS:  Constitutional: positive for fatigue Eyes: negative Ears, nose, mouth, throat, and face: negative Respiratory: negative Cardiovascular: negative Gastrointestinal: positive for nausea Genitourinary:negative Integument/breast: negative Hematologic/lymphatic: negative Musculoskeletal:negative Neurological: negative Behavioral/Psych: negative Endocrine: negative Allergic/Immunologic: negative   PHYSICAL EXAMINATION: General appearance: alert, cooperative, fatigued and no distress Head: Normocephalic, without obvious abnormality, atraumatic Neck: no adenopathy, no JVD, supple, symmetrical, trachea midline and thyroid not enlarged, symmetric, no tenderness/mass/nodules Lymph nodes: Cervical, supraclavicular, and axillary nodes normal. Resp: clear to auscultation bilaterally Back: symmetric, no curvature. ROM normal. No CVA tenderness. Cardio: regular rate and rhythm, S1, S2 normal, no murmur, click, rub or gallop GI: soft, non-tender; bowel sounds normal; no masses,  no organomegaly Extremities: extremities normal, atraumatic, no cyanosis or edema Neurologic: Alert and oriented X 3, normal strength and tone. Normal symmetric reflexes. Normal coordination and gait  ECOG PERFORMANCE STATUS: 1 - Symptomatic but completely ambulatory  Blood pressure 119/78, pulse 81, temperature 98.4 F (36.9 C), temperature source Oral, resp. rate 18, height 5\' 2"  (1.575 m), weight 107 lb (48.535 kg), SpO2 100 %.  LABORATORY DATA: Lab Results  Component Value Date   WBC 2.8* 05/06/2015   HGB 10.9* 05/06/2015   HCT 33.5* 05/06/2015   MCV 87.7 05/06/2015   PLT 295 05/06/2015      Chemistry      Component  Value Date/Time   NA 139 05/06/2015 0949   NA 137 04/24/2015 1209   K 4.3 05/06/2015 0949   K 4.3 04/24/2015 1209   CL 103 04/24/2015 1209   CO2 24 05/06/2015 0949   CO2 25 04/24/2015 1209   BUN 8.9 05/06/2015 0949   BUN 8 04/24/2015 1209   CREATININE 1.0 05/06/2015 0949   CREATININE 0.75 04/24/2015 1209      Component Value Date/Time   CALCIUM 9.4 05/06/2015 0949   CALCIUM 9.2 04/24/2015 1209   ALKPHOS 73 05/06/2015 0949   ALKPHOS 72 04/24/2015 1209   AST 20 05/06/2015 0949   AST 22 04/24/2015 1209   ALT 20 05/06/2015 0949   ALT 17 04/24/2015 1209   BILITOT 0.71 05/06/2015 0949   BILITOT 0.8 04/24/2015 1209       RADIOGRAPHIC STUDIES:  ASSESSMENT AND PLAN: this is a very pleasant 50 years old [place] American female recently diagnosed with a stage IIA gastric adenocarcinoma status post resection with lymph node dissection. She completed the first 2 cycles adjuvant chemotherapy with 5-FU and leucovorin and tolerated her treatment fairly well except for neutropenia and mild nausea. This was followed by a course of adjuvant radiotherapy and the patient was unable to tolerate the concurrent chemotherapy with Xeloda which was discontinued. She is currently on observation.  Her recent MRI of the abdomen in January 2017 showed no evidence for  disease recurrence. The patient continues to have intermittent nausea and vomiting. I will start her on Zyprexa 10 mg by mouth daily at bedtime for the nausea and vomiting. She is currently on Lovenox for history of deep venous thrombosis and pulmonary embolism but she is tired from the injection. I will switch her to Xarelto initially 15 mg by mouth twice a day for the first 3 weeks followed by 20 mg by mouth daily for the next 6 months. I recommended for the patient to continue on observation for now. I will order repeat CT scan of the abdomen and pelvis in 6 months for reevaluation of her condition. For pain management, she will continue on  her current pain medication. She was advised to call immediately if she has any concerning symptoms in the interval.  The patient voices understanding of current disease status and treatment options and is in agreement with the current care plan.  All questions were answered. The patient knows to call the clinic with any problems, questions or concerns. We can certainly see the patient much sooner if necessary.  Disclaimer: This note was dictated with voice recognition software. Similar sounding words can inadvertently be transcribed and may not be corrected upon review.

## 2015-05-12 ENCOUNTER — Telehealth: Payer: Self-pay | Admitting: *Deleted

## 2015-05-12 NOTE — Telephone Encounter (Signed)
"  Paula [name] assistant to Dr. Barry Dienes [phone]) calling in reference to this patient Dr. [name] saw yesterday.  She's called our office to schedule surgery.  We have not seen her in two years and her last scheduled appointment she was a no show.  Could someone call me as to why she's calling or send me an [facility] staff message.  Dr. Barry Dienes is out of the office until next week.  I will hold off on returning her call until I hear from Dr. [name] or his nurse."

## 2015-05-18 ENCOUNTER — Telehealth: Payer: Self-pay

## 2015-05-18 ENCOUNTER — Encounter: Payer: Medicaid Other | Admitting: Nurse Practitioner

## 2015-05-18 ENCOUNTER — Other Ambulatory Visit: Payer: Self-pay | Admitting: Medical Oncology

## 2015-05-18 ENCOUNTER — Telehealth: Payer: Self-pay | Admitting: Nurse Practitioner

## 2015-05-18 ENCOUNTER — Ambulatory Visit (INDEPENDENT_AMBULATORY_CARE_PROVIDER_SITE_OTHER): Payer: Medicaid Other

## 2015-05-18 ENCOUNTER — Other Ambulatory Visit: Payer: Medicaid Other

## 2015-05-18 VITALS — BP 131/82 | HR 83 | Temp 98.9°F | Wt 108.0 lb

## 2015-05-18 DIAGNOSIS — N924 Excessive bleeding in the premenopausal period: Secondary | ICD-10-CM

## 2015-05-18 DIAGNOSIS — N951 Menopausal and female climacteric states: Secondary | ICD-10-CM | POA: Diagnosis present

## 2015-05-18 DIAGNOSIS — Z3042 Encounter for surveillance of injectable contraceptive: Secondary | ICD-10-CM

## 2015-05-18 NOTE — Telephone Encounter (Signed)
Patient called today stating that she is "sick" and would like a call back from Dr. Worthy Flank nurse.  Message sent to RN.

## 2015-05-18 NOTE — Telephone Encounter (Signed)
perp of to sch pt appt-pt aware °

## 2015-05-18 NOTE — Telephone Encounter (Signed)
Her symptoms are a PCP issue and [name] has her on observation . He would not advise her of what to take or not take. Please have pt contact PCP -thanks

## 2015-05-18 NOTE — Telephone Encounter (Addendum)
Attempted to return call left on voice mail. Phone line busy.

## 2015-05-18 NOTE — Telephone Encounter (Signed)
Patient called this morning at 8:19 c/o sore throat and cough.  In- basket sent to Dr. Worthy Flank desk  for advice.  Received msg back stating;   Paula [name]  05/18/2015  Telephone  MRN:  [id number]   Description: 50 year old female  Provider: Lujean Amel, RN  Department: [facility]       Reason for Call     sick         Call Documentation      [name], RN at 05/18/2015 11:00 AM     Status: Signed       Expand All Collapse All   Her symptoms are a PCP issue and [name] has her on observation . He would not advise her of what to take or not take. Please have pt contact PCP -thanks            Lujean Amel, RN at 05/18/2015 9:30 AM     Status: Signed       Expand All Collapse All   Patient called today stating that she is "sick" and would like a call back from Dr. Worthy Flank nurse. Message sent to RN.       Patient called again at 10:09.  Patient then notified of above message.  Writer offered [facility] per provider and Paula Lesser, NP  Patient declined SCM.  Listened to patient verbalize desire to change providers.  Will be going to [facility] for symptoms today and has contacted Dr. Vernona Rieger office to change care.

## 2015-05-18 NOTE — Progress Notes (Signed)
Pt return to clinic for depo provera . She will return 06/12-06/27

## 2015-05-21 ENCOUNTER — Ambulatory Visit (HOSPITAL_BASED_OUTPATIENT_CLINIC_OR_DEPARTMENT_OTHER): Payer: Medicaid Other

## 2015-05-21 ENCOUNTER — Encounter: Payer: Self-pay | Admitting: Internal Medicine

## 2015-05-21 ENCOUNTER — Ambulatory Visit (HOSPITAL_BASED_OUTPATIENT_CLINIC_OR_DEPARTMENT_OTHER): Payer: Medicaid Other | Admitting: Hematology & Oncology

## 2015-05-21 ENCOUNTER — Other Ambulatory Visit: Payer: Medicaid Other

## 2015-05-21 ENCOUNTER — Encounter: Payer: Self-pay | Admitting: Hematology & Oncology

## 2015-05-21 VITALS — BP 137/84 | HR 80 | Temp 98.1°F | Resp 18 | Ht 62.0 in | Wt 109.0 lb

## 2015-05-21 DIAGNOSIS — D508 Other iron deficiency anemias: Secondary | ICD-10-CM

## 2015-05-21 DIAGNOSIS — I82409 Acute embolism and thrombosis of unspecified deep veins of unspecified lower extremity: Secondary | ICD-10-CM

## 2015-05-21 DIAGNOSIS — R110 Nausea: Secondary | ICD-10-CM

## 2015-05-21 DIAGNOSIS — Z85028 Personal history of other malignant neoplasm of stomach: Secondary | ICD-10-CM

## 2015-05-21 DIAGNOSIS — R634 Abnormal weight loss: Secondary | ICD-10-CM

## 2015-05-21 DIAGNOSIS — I2699 Other pulmonary embolism without acute cor pulmonale: Secondary | ICD-10-CM | POA: Diagnosis not present

## 2015-05-21 DIAGNOSIS — K8681 Exocrine pancreatic insufficiency: Secondary | ICD-10-CM

## 2015-05-21 DIAGNOSIS — C169 Malignant neoplasm of stomach, unspecified: Secondary | ICD-10-CM

## 2015-05-21 DIAGNOSIS — K909 Intestinal malabsorption, unspecified: Secondary | ICD-10-CM

## 2015-05-21 MED ORDER — PANCRELIPASE (LIP-PROT-AMYL) 24000-76000 UNITS PO CPEP: 1.0000 | ORAL_CAPSULE | Freq: Three times a day (TID) | ORAL | Status: DC

## 2015-05-21 NOTE — Progress Notes (Signed)
Left nctraks form for dr. [name] to sign for prior auth for olanzapine

## 2015-05-22 LAB — VITAMIN B12: VITAMIN B 12: 567 pg/mL (ref 211–946)

## 2015-05-22 LAB — TSH: TSH: 0.868 m(IU)/L (ref 0.308–3.960)

## 2015-05-22 LAB — IRON AND TIBC
%SAT: 6 % — ABNORMAL LOW (ref 21–57)
IRON: 21 ug/dL — AB (ref 41–142)
TIBC: 325 ug/dL (ref 236–444)
UIBC: 304 ug/dL (ref 120–384)

## 2015-05-22 LAB — IGG, IGA, IGM
IgA, Qn, Serum: 67 mg/dL — ABNORMAL LOW (ref 87–352)
IgG, Qn, Serum: 1273 mg/dL (ref 700–1600)
IgM, Qn, Serum: 40 mg/dL (ref 26–217)

## 2015-05-22 LAB — FERRITIN: Ferritin: 25 ng/ml (ref 9–269)

## 2015-05-25 ENCOUNTER — Other Ambulatory Visit: Payer: Self-pay | Admitting: Hematology & Oncology

## 2015-05-25 ENCOUNTER — Encounter: Payer: Self-pay | Admitting: Hematology & Oncology

## 2015-05-25 DIAGNOSIS — K909 Intestinal malabsorption, unspecified: Secondary | ICD-10-CM

## 2015-05-25 DIAGNOSIS — D500 Iron deficiency anemia secondary to blood loss (chronic): Secondary | ICD-10-CM

## 2015-05-25 HISTORY — DX: Intestinal malabsorption, unspecified: K90.9

## 2015-05-25 HISTORY — DX: Iron deficiency anemia secondary to blood loss (chronic): D50.0

## 2015-05-25 NOTE — Progress Notes (Signed)
[place] [phone]   [phone]  OFFICE PROGRESS NOTE  Pcp Not In System No address on file  DIAGNOSIS: Stage IIA (T1a, N2, M0) gastric adenocarcinoma diagnosed in July of 2015  PRIOR THERAPY: 1) Status post diagnostic laparoscopy, distal gastrectomy with billroth II anastamosis and feeding gastrojejunostomy tube under the care of Dr. Barry Dienes on 09/20/2013. 2) Adjuvant systemic chemotherapy with 5-FU 600 mg/M2 with leucovorin on days 1, 8, 15, 22, 29 and 36 every 8 weeks. First dose 11/19/2013. 3) Adjuvant concurrent chemoradiation with Xeloda 650 MG/M2 by mouth twice a day for 5 days every week with radiation. The patient completed the course of radiotherapy but she declined to take Xeloda during her radiation.  CURRENT THERAPY: None.  INTERVAL HISTORY: Paula [name] 50 y.o. female returns to the clinic today for followup . The patient has been on observation for more than a year. She still complaining of intermittent nausea and vomiting. She was evaluated in different health system in the last few months including [facility], at [facility] as well as [facility]. There was no remarkable findings on her previous evaluation. She was also seen by her gastroenterologist Dr. Penelope Coop. She had MRI of the abdomen in January 2017 that showed no evidence for disease recurrence. She was referred by Dr. Penelope Coop to a gastroenterologist at [facility] for further evaluation. That evaluation to placing January. 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 She has also been seen by Dr. Barry Dienes her surgeon for reevaluation. She denied having any significant fever or chills, but has occasional nausea with no vomiting. She denied having any significant shortness of breath or hemoptysis. She has no significant weight loss or night sweats. She feels tired all the time. She just does not have a lot of energy.  There's been  no bleeding. She's had no diarrhea.  Overall, her performance status is ECOG 1.  Really MEDICAL HISTORY: Past Medical History  Diagnosis Date  . Headache(784.0) 2011    Migraines  . Esophageal reflux     On omeprazole  . Costochondritis   . Hypertension     started around 2011  . Ovarian cyst   . Stomach cancer ([place])   . Family history of cancer   . Heart murmur   . On antineoplastic chemotherapy     5-FU and leucovorin  . Pneumonia   . Pulmonary emboli ([facility])     ALLERGIES:  is allergic to tape.  MEDICATIONS:  Current Outpatient Prescriptions  Medication Sig Dispense Refill  . carvedilol (COREG) 6.25 MG tablet Take 6.25 mg by mouth 2 (two) times daily with a meal.    . medroxyPROGESTERone (DEPO-PROVERA) 150 MG/ML injection Inject 150 mg into the muscle every 3 (three) months.    . Multiple Vitamins-Minerals (MULTIVITAMIN & MINERAL PO) Take 1 tablet by mouth daily.    Marland Kitchen OLANZapine (ZYPREXA) 10 MG tablet Take 1 tablet (10 mg total) by mouth at bedtime. 30 tablet 1  . omeprazole (PRILOSEC) 20 MG capsule Take 20 mg by mouth daily.  5  . ondansetron (ZOFRAN) 4 MG tablet Take 4 mg by mouth.    . promethazine (PHENERGAN) 12.5 MG tablet Take 12.5 mg by mouth every 6 (six) hours as needed.  2  . Rivaroxaban (XARELTO STARTER PACK) 15 & 20 MG TBPK Take as directed on package: Start with one 15mg  tablet by mouth twice a day with food. On Day 22, switch  to one 20mg  tablet once a day with food. 51 each 0  . traMADol (ULTRAM) 50 MG tablet Take 50 mg by mouth.    . Pancrelipase, Lip-Prot-Amyl, 24000 units CPEP Take 1 capsule (24,000 Units total) by mouth 3 (three) times daily before meals. 180 capsule 5  . [DISCONTINUED] pantoprazole (PROTONIX) 40 MG tablet Take 1 tablet (40 mg total) by mouth 2 (two) times daily before a meal. (Patient not taking: Reported on 07/18/2014) 60 tablet 3  . [DISCONTINUED] temazepam (RESTORIL) 30 MG capsule Take 1 capsule (30 mg total) by mouth at bedtime as  needed for sleep. (Patient not taking: Reported on 07/18/2014) 30 capsule 0   Current Facility-Administered Medications  Medication Dose Route Frequency Provider Last Rate Last Dose  . medroxyPROGESTERone (DEPO-PROVERA) injection 150 mg  150 mg Intramuscular Q90 days Osborne Oman, MD   150 mg at 05/18/15 1023    SURGICAL HISTORY:  Past Surgical History  Procedure Laterality Date  . Oophorectomy      Around 1985 left ovary removal  . Tonsillectomy      Around 1994  . Tubal ligation    . Esophagogastroduodenoscopy N/A 09/16/2013    Procedure: ESOPHAGOGASTRODUODENOSCOPY (EGD);  Surgeon: Wonda Horner, MD;  Location: [facility];  Service: Endoscopy;  Laterality: N/A;  . Laparoscopy N/A 09/20/2013    Procedure: DIAGNOSTIC LAPAROSCOPY,DISTAL GASTRECTOMY AND FEEDING GASTROJEJUNOSTOMY;  Surgeon: [name], MD;  Location: [facility];  Service: General;  Laterality: N/A;  . Portacath placement Left 11/18/2013    Procedure: INSERTION PORT-A-CATH;  Surgeon: [name], MD;  Location: [facility];  Service: General;  Laterality: Left;    REVIEW OF SYSTEMS:  Constitutional: positive for fatigue Eyes: negative Ears, nose, mouth, throat, and face: negative Respiratory: negative Cardiovascular: negative Gastrointestinal: positive for nausea Genitourinary:negative Integument/breast: negative Hematologic/lymphatic: negative Musculoskeletal:negative Neurological: negative Behavioral/Psych: negative Endocrine: negative Allergic/Immunologic: negative   PHYSICAL EXAMINATION: General appearance: alert, cooperative, fatigued and no distress Head: Normocephalic, without obvious abnormality, atraumatic Neck: no adenopathy, no JVD, supple, symmetrical, trachea midline and thyroid not enlarged, symmetric, no tenderness/mass/nodules Lymph nodes: Cervical, supraclavicular, and axillary nodes normal. Resp: clear to auscultation bilaterally Back: symmetric, no curvature. ROM normal. No CVA  tenderness. Cardio: regular rate and rhythm, S1, S2 normal, no murmur, click, rub or gallop GI: soft, non-tender; bowel sounds normal; no masses,  no organomegaly Extremities: extremities normal, atraumatic, no cyanosis or edema Neurologic: Alert and oriented X 3, normal strength and tone. Normal symmetric reflexes. Normal coordination and gait  ECOG PERFORMANCE STATUS: 1 - Symptomatic but completely ambulatory  Blood pressure 137/84, pulse 80, temperature 98.1 F (36.7 C), temperature source Oral, resp. rate 18, height 5\' 2"  (1.575 m), weight 109 lb (49.442 kg).  LABORATORY DATA: Lab Results  Component Value Date   WBC 2.8* 05/06/2015   HGB 10.9* 05/06/2015   HCT 33.5* 05/06/2015   MCV 87.7 05/06/2015   PLT 295 05/06/2015      Chemistry      Component Value Date/Time   NA 139 05/06/2015 0949   NA 137 04/24/2015 1209   K 4.3 05/06/2015 0949   K 4.3 04/24/2015 1209   CL 103 04/24/2015 1209   CO2 24 05/06/2015 0949   CO2 25 04/24/2015 1209   BUN 8.9 05/06/2015 0949   BUN 8 04/24/2015 1209   CREATININE 1.0 05/06/2015 0949   CREATININE 0.75 04/24/2015 1209      Component Value Date/Time   CALCIUM 9.4 05/06/2015 0949   CALCIUM 9.2 04/24/2015 1209  ALKPHOS 73 05/06/2015 0949   ALKPHOS 72 04/24/2015 1209   AST 20 05/06/2015 0949   AST 22 04/24/2015 1209   ALT 20 05/06/2015 0949   ALT 17 04/24/2015 1209   BILITOT 0.71 05/06/2015 0949   BILITOT 0.8 04/24/2015 1209       RADIOGRAPHIC STUDIES:  ASSESSMENT AND PLAN:    [name] is a very nice 50 year old African-American female with past history of stage II gastric cancer. She underwent a partial gastrectomy followed by adjuvant chemotherapy and radiation therapy. This office in April 2016. So far, his been no evidence of recurrent disease.  I would had a think that her symptoms are somewhat related to her surgery and adjuvant therapy. She just has "different plumbing" no Georgann Housekeeper is back and be overcome.  She  has seen multiple physicians. Again now much has been done back in try to help this.  Her labs today show that she is a normal vitamin B-12 level. Her IgG level is 1273. This should be more than adequate for her immune system. Her eye she level is somewhat decreased. I suppose this might increase her risk of respiratory tract infections.  Her iron studies are quite low. Her ferritin is only 25 and iron saturation of 6%. I think that she needs IV iron. We'll see about getting this set up for her.  Her last scans were done in February of this year. It was a Psychologist, forensic. Everything looked okay. Her abdominal vasculature looked all right.   She is on Xarelto. I'm unsure of this is causing any issues for her. I would not think this would be a problem. She is on quite a few antibiotics.  I did put her on some Creon to see this would help her.  I spent about 45 minutes today. She has a lot of issues. Again it is hard to say how much will really be able to be fixed because of her surgery and past treatment.  We'll see the iron helps her.  I'll see her back in about 6 weeks.

## 2015-05-28 ENCOUNTER — Ambulatory Visit (HOSPITAL_BASED_OUTPATIENT_CLINIC_OR_DEPARTMENT_OTHER): Payer: Medicaid Other

## 2015-05-28 VITALS — BP 127/81 | HR 83 | Temp 99.4°F | Resp 20 | Wt 109.0 lb

## 2015-05-28 DIAGNOSIS — K909 Intestinal malabsorption, unspecified: Secondary | ICD-10-CM

## 2015-05-28 DIAGNOSIS — D61818 Other pancytopenia: Secondary | ICD-10-CM

## 2015-05-28 DIAGNOSIS — D500 Iron deficiency anemia secondary to blood loss (chronic): Secondary | ICD-10-CM

## 2015-05-28 DIAGNOSIS — C169 Malignant neoplasm of stomach, unspecified: Secondary | ICD-10-CM

## 2015-05-28 MED ORDER — SODIUM CHLORIDE 0.9 % IV SOLN
510.0000 mg | Freq: Once | INTRAVENOUS | Status: AC
  Administered 2015-05-28: 510 mg via INTRAVENOUS
  Filled 2015-05-28: qty 17

## 2015-05-28 NOTE — Patient Instructions (Signed)

## 2015-06-01 ENCOUNTER — Ambulatory Visit (INDEPENDENT_AMBULATORY_CARE_PROVIDER_SITE_OTHER): Payer: Medicaid Other | Admitting: Family Medicine

## 2015-06-01 ENCOUNTER — Encounter: Payer: Self-pay | Admitting: Family Medicine

## 2015-06-01 VITALS — BP 123/85 | HR 73 | Ht 60.0 in | Wt 109.0 lb

## 2015-06-01 DIAGNOSIS — S8992XD Unspecified injury of left lower leg, subsequent encounter: Secondary | ICD-10-CM

## 2015-06-01 NOTE — Patient Instructions (Signed)
I'm concerned you have a medial meniscus tear in this knee. These are the different medications you can use for this: Tylenol 500mg  1-2 tabs three times a day for pain. Avoid ibuprofen and aleve. Glucosamine sulfate 750mg  twice a day is a supplement that may help. Capsaicin, aspercreme, or biofreeze topically up to four times a day may also help with pain. Cortisone injections are an option - call us if you want to do one of these. It's important that you continue to stay active. Straight leg raises, knee extensions 3 sets of 10 once a day (add ankle weight if these become too easy). Consider physical therapy to strengthen muscles around the joint that hurts to take pressure off of the joint itself though medicaid only lets you get one visit. Shoe inserts with good arch support may be helpful. Walker or cane if needed. Heat or ice 15 minutes at a time 3-4 times a day as needed to help with pain. Knee brace for support. Follow up with me in 6 weeks for reevaluation.

## 2015-06-02 NOTE — Assessment & Plan Note (Signed)
imaging including radiographs and CT were negative from 2 years ago.  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  We discussed her options - tylenol, glucosamine, topical medications, shown home exercises to do daily.  She will call us if she wants to do an injection.  Knee brace, heat/ice.  F/u in 6 weeks.
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
## 2015-06-08 ENCOUNTER — Ambulatory Visit (HOSPITAL_BASED_OUTPATIENT_CLINIC_OR_DEPARTMENT_OTHER): Payer: Medicaid Other

## 2015-06-08 ENCOUNTER — Other Ambulatory Visit: Payer: Self-pay

## 2015-06-08 VITALS — BP 109/69 | HR 82 | Temp 98.4°F | Resp 18 | Wt 111.0 lb

## 2015-06-08 DIAGNOSIS — K909 Intestinal malabsorption, unspecified: Secondary | ICD-10-CM | POA: Diagnosis not present

## 2015-06-08 DIAGNOSIS — D61818 Other pancytopenia: Secondary | ICD-10-CM

## 2015-06-08 DIAGNOSIS — D500 Iron deficiency anemia secondary to blood loss (chronic): Secondary | ICD-10-CM

## 2015-06-08 DIAGNOSIS — C169 Malignant neoplasm of stomach, unspecified: Secondary | ICD-10-CM

## 2015-06-08 MED ORDER — SODIUM CHLORIDE 0.9 % IV SOLN
510.0000 mg | Freq: Once | INTRAVENOUS | Status: AC
  Administered 2015-06-08: 510 mg via INTRAVENOUS
  Filled 2015-06-08: qty 17

## 2015-06-08 MED ORDER — METOCLOPRAMIDE HCL 10 MG PO TABS: 10.0000 mg | ORAL_TABLET | Freq: Three times a day (TID) | ORAL | Status: DC

## 2015-06-08 NOTE — Progress Notes (Signed)
Pt reports she has been vomiting nearly each night. Denies nausea before the episodes. No weight loss noted. Per Dr Marin Olp, Reglan called in. Pt verbalizes understanding & is aware if this does not help, she may need to contact her GI doctor.  dph

## 2015-06-08 NOTE — Patient Instructions (Signed)

## 2015-06-11 ENCOUNTER — Other Ambulatory Visit: Payer: Self-pay | Admitting: *Deleted

## 2015-06-11 MED ORDER — RIVAROXABAN 20 MG PO TABS
20.0000 mg | ORAL_TABLET | Freq: Every day | ORAL | Status: DC
Start: 2015-06-11 — End: 2016-09-23

## 2015-06-22 ENCOUNTER — Ambulatory Visit (HOSPITAL_COMMUNITY): Payer: Medicaid Other

## 2015-06-22 ENCOUNTER — Telehealth: Payer: Self-pay

## 2015-06-22 NOTE — Telephone Encounter (Signed)
Pt would like to know if she can have her depo-provera a month early. She has been having some break though bleeding and she does not like it. She also mentioned maybe changing to something else if this continues. I discuss with patient that she may have some bleeding while on depo provera. Please advise

## 2015-06-22 NOTE — Telephone Encounter (Signed)
Patient may receive a dose of depo-provera sooner than indicated per Dr.Constant. I have left a message on patient voicemail to call us back concerning depo-provera.

## 2015-06-22 NOTE — Telephone Encounter (Signed)
Pt will come in tomorrow for depo

## 2015-06-23 ENCOUNTER — Ambulatory Visit (INDEPENDENT_AMBULATORY_CARE_PROVIDER_SITE_OTHER): Payer: Medicaid Other

## 2015-06-23 VITALS — BP 140/85 | Wt 111.7 lb

## 2015-06-23 DIAGNOSIS — N924 Excessive bleeding in the premenopausal period: Secondary | ICD-10-CM | POA: Diagnosis not present

## 2015-06-23 DIAGNOSIS — Z3042 Encounter for surveillance of injectable contraceptive: Secondary | ICD-10-CM

## 2015-06-23 DIAGNOSIS — N951 Menopausal and female climacteric states: Secondary | ICD-10-CM

## 2015-06-23 NOTE — Progress Notes (Signed)
Pt present today for her depo-provera injection given in left deltoid  muscle. Per Dr.Constant ok to give earlier than recommend.

## 2015-06-26 ENCOUNTER — Other Ambulatory Visit (HOSPITAL_BASED_OUTPATIENT_CLINIC_OR_DEPARTMENT_OTHER): Payer: Medicaid Other

## 2015-06-26 ENCOUNTER — Telehealth: Payer: Self-pay | Admitting: Nurse Practitioner

## 2015-06-26 ENCOUNTER — Ambulatory Visit (HOSPITAL_BASED_OUTPATIENT_CLINIC_OR_DEPARTMENT_OTHER): Payer: Medicaid Other | Admitting: Hematology & Oncology

## 2015-06-26 ENCOUNTER — Encounter: Payer: Self-pay | Admitting: Hematology & Oncology

## 2015-06-26 VITALS — BP 123/72 | HR 68 | Temp 98.6°F | Resp 16 | Ht 60.0 in | Wt 114.0 lb

## 2015-06-26 DIAGNOSIS — D500 Iron deficiency anemia secondary to blood loss (chronic): Secondary | ICD-10-CM | POA: Diagnosis not present

## 2015-06-26 DIAGNOSIS — C169 Malignant neoplasm of stomach, unspecified: Secondary | ICD-10-CM

## 2015-06-26 DIAGNOSIS — D508 Other iron deficiency anemias: Secondary | ICD-10-CM

## 2015-06-26 DIAGNOSIS — K8681 Exocrine pancreatic insufficiency: Secondary | ICD-10-CM

## 2015-06-26 DIAGNOSIS — K909 Intestinal malabsorption, unspecified: Secondary | ICD-10-CM | POA: Diagnosis not present

## 2015-06-26 DIAGNOSIS — R634 Abnormal weight loss: Secondary | ICD-10-CM

## 2015-06-26 DIAGNOSIS — Z85028 Personal history of other malignant neoplasm of stomach: Secondary | ICD-10-CM

## 2015-06-26 LAB — COMPREHENSIVE METABOLIC PANEL
ALBUMIN: 3.5 g/dL (ref 3.5–5.0)
ALK PHOS: 74 U/L (ref 40–150)
ALT: 15 U/L (ref 0–55)
AST: 14 U/L (ref 5–34)
Anion Gap: 8 mEq/L (ref 3–11)
BUN: 12.3 mg/dL (ref 7.0–26.0)
CALCIUM: 8.8 mg/dL (ref 8.4–10.4)
CHLORIDE: 109 meq/L (ref 98–109)
CO2: 21 mEq/L — ABNORMAL LOW (ref 22–29)
CREATININE: 0.9 mg/dL (ref 0.6–1.1)
EGFR: 85 mL/min/{1.73_m2} — ABNORMAL LOW (ref >90)
GLUCOSE: 138 mg/dL (ref 70–140)
Potassium: 3.9 mEq/L (ref 3.5–5.1)
SODIUM: 138 meq/L (ref 136–145)
Total Bilirubin: 0.63 mg/dL (ref 0.20–1.20)
Total Protein: 6.2 g/dL — ABNORMAL LOW (ref 6.4–8.3)

## 2015-06-26 LAB — IRON AND TIBC
%SAT: 57 % (ref 21–57)
Iron: 117 ug/dL (ref 41–142)
TIBC: 204 ug/dL — ABNORMAL LOW (ref 236–444)
UIBC: 87 ug/dL — ABNORMAL LOW (ref 120–384)

## 2015-06-26 LAB — CBC WITH DIFFERENTIAL (CANCER CENTER ONLY)
BASO#: 0 10*3/uL (ref 0.0–0.2)
BASO%: 0.7 % (ref 0.0–2.0)
EOS%: 2.5 % (ref 0.0–7.0)
Eosinophils Absolute: 0.1 10*3/uL (ref 0.0–0.5)
HEMATOCRIT: 30.9 % — AB (ref 34.8–46.6)
HGB: 10.3 g/dL — ABNORMAL LOW (ref 11.6–15.9)
LYMPH#: 0.8 10*3/uL — ABNORMAL LOW (ref 0.9–3.3)
LYMPH%: 28 % (ref 14.0–48.0)
MCH: 29.7 pg (ref 26.0–34.0)
MCHC: 33.3 g/dL (ref 32.0–36.0)
MCV: 89 fL (ref 81–101)
MONO#: 0.3 10*3/uL (ref 0.1–0.9)
MONO%: 12.2 % (ref 0.0–13.0)
NEUT#: 1.6 10*3/uL (ref 1.5–6.5)
NEUT%: 56.6 % (ref 39.6–80.0)
PLATELETS: 236 10*3/uL (ref 145–400)
RBC: 3.47 10*6/uL — ABNORMAL LOW (ref 3.70–5.32)
RDW: 15.6 % (ref 11.1–15.7)
WBC: 2.8 10*3/uL — AB (ref 3.9–10.0)

## 2015-06-26 LAB — FERRITIN: Ferritin: 585 ng/ml — ABNORMAL HIGH (ref 9–269)

## 2015-06-26 NOTE — Telephone Encounter (Addendum)
Pt verbalized understanding and appreciation.----- Message from Volanda Napoleon, MD sent at 06/26/2015 12:45 PM EDT ----- Call - iron level is very good!!!! pete

## 2015-06-30 NOTE — Progress Notes (Signed)
[facility] [phone]   [phone]  OFFICE PROGRESS NOTE  Pcp Not In System No address on file  DIAGNOSIS: Stage IIA (T1a, N2, M0) gastric adenocarcinoma diagnosed in July of 2015  PRIOR THERAPY: 1) Status post diagnostic laparoscopy, distal gastrectomy with billroth II anastamosis and feeding gastrojejunostomy tube under the care of Dr. Barry Dienes on 09/20/2013. 2) Adjuvant systemic chemotherapy with 5-FU 600 mg/M2 with leucovorin on days 1, 8, 15, 22, 29 and 36 every 8 weeks. First dose 11/19/2013. 3) Adjuvant concurrent chemoradiation with Xeloda 650 MG/M2 by mouth twice a day for 5 days every week with radiation. The patient completed the course of radiotherapy but she declined to take Xeloda during her radiation.  CURRENT THERAPY: None.  INTERVAL HISTORY: Paula [name] 50 y.o. female returns to the clinic today for followup . She actually feels better. If we did find that she is iron deficient. We saw her back in late March, her ferritin was on 25 and her iron saturation was only 6%. We did go ahead and give her some IV iron. This did make her feel better. Patient still has a little bit of nausea. She's had no vomiting. She has a little bit more energy right now.  She's had no pain issues. She is on some Creon. I put her on some discharge to help with digestion. I thought maybe some of her issues that she was having could've been from pancreatic insufficiency.  She's had no bleeding. Thereafter I think that she probably does have some malabsorption given that she has had part of her stomach taken out. Also, the thigh that she has had radiation therapy probably is limiting her absorption.  When we saw her back in March, her B-12 level was okay at 567. She had normal immunoglobulin level. Her IgG level was 1273. We checked her thyroid. This is also okay. She is not hyperglycemic.  I'm just glad that she is feeling better. He  goes  Overall, her performance status is ECOG 1.  Really MEDICAL HISTORY: Past Medical History  Diagnosis Date  . Headache(784.0) 2011    Migraines  . Esophageal reflux     On omeprazole  . Costochondritis   . Hypertension     started around 2011  . Ovarian cyst   . Stomach cancer ([place])   . Family history of cancer   . Heart murmur   . On antineoplastic chemotherapy     5-FU and leucovorin  . Pneumonia   . Pulmonary emboli ([place])   . Iron deficiency anemia due to chronic blood loss 05/25/2015  . 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  MEDICATIONS:  Current Outpatient Prescriptions  Medication Sig Dispense Refill  . carvedilol (COREG) 6.25 MG tablet Take 6.25 mg by mouth 2 (two) times daily with a meal.    . metoCLOPramide (REGLAN) 10 MG tablet Take 1 tablet (10 mg total) by mouth 4 (four) times daily -  before meals and at bedtime. 120 tablet 3  . Multiple Vitamins-Minerals (MULTIVITAMIN & MINERAL PO) Take 1 tablet by mouth daily.    Marland Kitchen OLANZapine (ZYPREXA) 10 MG tablet Take 1 tablet (10 mg total) by mouth at bedtime. 30 tablet 1  . omeprazole (PRILOSEC) 20 MG capsule Take 20 mg by mouth daily.  5  . ondansetron (ZOFRAN) 4 MG tablet Take 4 mg by mouth.    . Pancrelipase, Lip-Prot-Amyl, 24000 units CPEP Take 1  capsule (24,000 Units total) by mouth 3 (three) times daily before meals. 180 capsule 5  . promethazine (PHENERGAN) 12.5 MG tablet Take 12.5 mg by mouth every 6 (six) hours as needed.  2  . rivaroxaban (XARELTO) 20 MG TABS tablet Take 1 tablet (20 mg total) by mouth daily with supper. 30 tablet 6  . traMADol (ULTRAM) 50 MG tablet Take 50 mg by mouth.    . [DISCONTINUED] pantoprazole (PROTONIX) 40 MG tablet Take 1 tablet (40 mg total) by mouth 2 (two) times daily before a meal. (Patient not taking: Reported on 07/18/2014) 60 tablet 3  . [DISCONTINUED] temazepam (RESTORIL) 30 MG capsule Take 1 capsule (30 mg total) by mouth at bedtime as needed for  sleep. (Patient not taking: Reported on 07/18/2014) 30 capsule 0   Current Facility-Administered Medications  Medication Dose Route Frequency Provider Last Rate Last Dose  . medroxyPROGESTERone (DEPO-PROVERA) injection 150 mg  150 mg Intramuscular Q90 days Osborne Oman, MD   150 mg at 06/23/15 [id number]    SURGICAL HISTORY:  Past Surgical History  Procedure Laterality Date  . Oophorectomy      Around 1985 left ovary removal  . Tonsillectomy      Around 1994  . Tubal ligation    . Esophagogastroduodenoscopy N/A 09/16/2013    Procedure: ESOPHAGOGASTRODUODENOSCOPY (EGD);  Surgeon: Wonda Horner, MD;  Location: [facility];  Service: Endoscopy;  Laterality: N/A;  . Laparoscopy N/A 09/20/2013    Procedure: DIAGNOSTIC LAPAROSCOPY,DISTAL GASTRECTOMY AND FEEDING GASTROJEJUNOSTOMY;  Surgeon: [name], MD;  Location: [facility];  Service: General;  Laterality: N/A;  . Portacath placement Left 11/18/2013    Procedure: INSERTION PORT-A-CATH;  Surgeon: [name], MD;  Location: [facility];  Service: General;  Laterality: Left;    REVIEW OF SYSTEMS:     PHYSICAL EXAMINATION: Well-developed and well-nourished African American female in no obvious distress. Vital signs temperature of 98.6. Pulse 60. Blood pressure 123/72. Weight is 114 pounds. Head and neck exam shows no ocular or oral lesions. There is no adenopathy in her neck. There is no scleral icterus. Thyroid is nonpalpable. Lungs are clear. Cardiac exam regular rate and rhythm with no murmurs, rubs or bruits. Abdomen is soft. She has good bowel sounds. There is no fluid wave. There is no palpable liver or spleen tip. She has well-healed laparotomy scars. Extremity shows no clubbing, cyanosis or edema. Back exam shows no tenderness over the spine, ribs or hips. Extremities shows no clubbing, cyanosis or edema. Skin exam shows no rashes, ecchymoses or petechia.  ECOG PERFORMANCE STATUS: 1 - Symptomatic but completely ambulatory  Blood pressure 123/72,  pulse 68, temperature 98.6 F (37 C), temperature source Oral, resp. rate 16, height 5' (1.524 m), weight 114 lb (51.71 kg).  LABORATORY DATA: Lab Results  Component Value Date   WBC 2.8* 06/26/2015   HGB 10.3* 06/26/2015   HCT 30.9* 06/26/2015   MCV 89 06/26/2015   PLT 236 06/26/2015      Chemistry      Component Value Date/Time   NA 138 06/26/2015 0827   NA 137 04/24/2015 1209   K 3.9 06/26/2015 0827   K 4.3 04/24/2015 1209   CL 103 04/24/2015 1209   CO2 21* 06/26/2015 0827   CO2 25 04/24/2015 1209   BUN 12.3 06/26/2015 0827   BUN 8 04/24/2015 1209   CREATININE 0.9 06/26/2015 0827   CREATININE 0.75 04/24/2015 1209      Component Value Date/Time   CALCIUM 8.8 06/26/2015 0827  CALCIUM 9.2 04/24/2015 1209   ALKPHOS 74 06/26/2015 0827   ALKPHOS 72 04/24/2015 1209   AST 14 06/26/2015 0827   AST 22 04/24/2015 1209   ALT 15 06/26/2015 0827   ALT 17 04/24/2015 1209   BILITOT 0.63 06/26/2015 0827   BILITOT 0.8 04/24/2015 1209       RADIOGRAPHIC STUDIES:  ASSESSMENT AND PLAN:    [name] is a very nice 50 year old African-American female with past history of stage II gastric cancer. She underwent a partial gastrectomy followed by adjuvant chemotherapy and radiation therapy. This office in April 2016. So far, his been no evidence of recurrent disease.  I would had a think that her symptoms are somewhat related to her surgery and adjuvant therapy. She just has "different plumbing" .  Hopefully, she will continue to do okay. Again the iron really helped. Her ferritin today is 585 with iron saturation of 57%.  She saw some anemia. I suspect that she may have some degree of anemia. This might be from her treatments in the past. I do not think that she needs a bone marrow test.  For now, we will plan to get her back in another couple months. She is feeling better.  I spent about 30 minutes with her.   Lattie Haw

## 2015-07-13 ENCOUNTER — Ambulatory Visit: Payer: Medicaid Other | Admitting: Family Medicine

## 2015-07-13 ENCOUNTER — Telehealth: Payer: Self-pay | Admitting: *Deleted

## 2015-07-13 DIAGNOSIS — K8681 Exocrine pancreatic insufficiency: Secondary | ICD-10-CM

## 2015-07-13 DIAGNOSIS — C169 Malignant neoplasm of stomach, unspecified: Secondary | ICD-10-CM

## 2015-07-13 DIAGNOSIS — K909 Intestinal malabsorption, unspecified: Secondary | ICD-10-CM

## 2015-07-13 DIAGNOSIS — D508 Other iron deficiency anemias: Secondary | ICD-10-CM

## 2015-07-13 DIAGNOSIS — R634 Abnormal weight loss: Secondary | ICD-10-CM

## 2015-07-13 MED ORDER — TRAMADOL HCL 50 MG PO TABS: 50.0000 mg | ORAL_TABLET | Freq: Three times a day (TID) | ORAL | Status: DC | PRN

## 2015-07-13 MED ORDER — ONDANSETRON HCL 4 MG PO TABS: 8.0000 mg | ORAL_TABLET | Freq: Two times a day (BID) | ORAL | Status: DC

## 2015-07-13 NOTE — Telephone Encounter (Signed)
Patient c/o abdominal pain and nausea since Friday. She states it feels like she has a 'knot' in her abdomen and since Friday, after meals she gets nauseous. She is taking phenergan for nausea; she no longer has any tramadol at home and needs a new prescription.  Spoke to Dr Marin Olp who wants patient's zofran prescription to be refilled and to send a tramadol prescription. Patient aware of new scripts.

## 2015-07-17 ENCOUNTER — Ambulatory Visit: Payer: Medicaid Other | Admitting: Family Medicine

## 2015-07-31 ENCOUNTER — Telehealth: Payer: Self-pay | Admitting: *Deleted

## 2015-07-31 NOTE — Telephone Encounter (Signed)
error 

## 2015-07-31 NOTE — Telephone Encounter (Signed)
Patient c/o pain to her old tube feeding site. She states there is always a knot there, but yesterday it became more pronounced and painful. She took Ultram and the pain improved. She knows she has issues with scar tissue but she wasn't sure if there was something of concern or she should be seen.  Spoke to Dr Marin Olp and he states it is likely scar tissue and she doesn't need to be seen any sooner than scheduled. Patient aware and satisfied with plan. She will continue to take tramadol as prescribed for pain.

## 2015-08-03 ENCOUNTER — Encounter: Payer: Self-pay | Admitting: *Deleted

## 2015-08-04 IMAGING — CR DG ABDOMEN ACUTE W/ 1V CHEST
3 series · 3 of 3 positions shown · non-contrast
Comparison: Acute abdominal series 10/19/2013

CLINICAL DATA: Worsening abdominal pain after recent discharge.
Recent diagnosis of gastric cancer.

EXAM:
ACUTE ABDOMEN SERIES (ABDOMEN 2 VIEW & CHEST 1 VIEW)

[w chest ap]
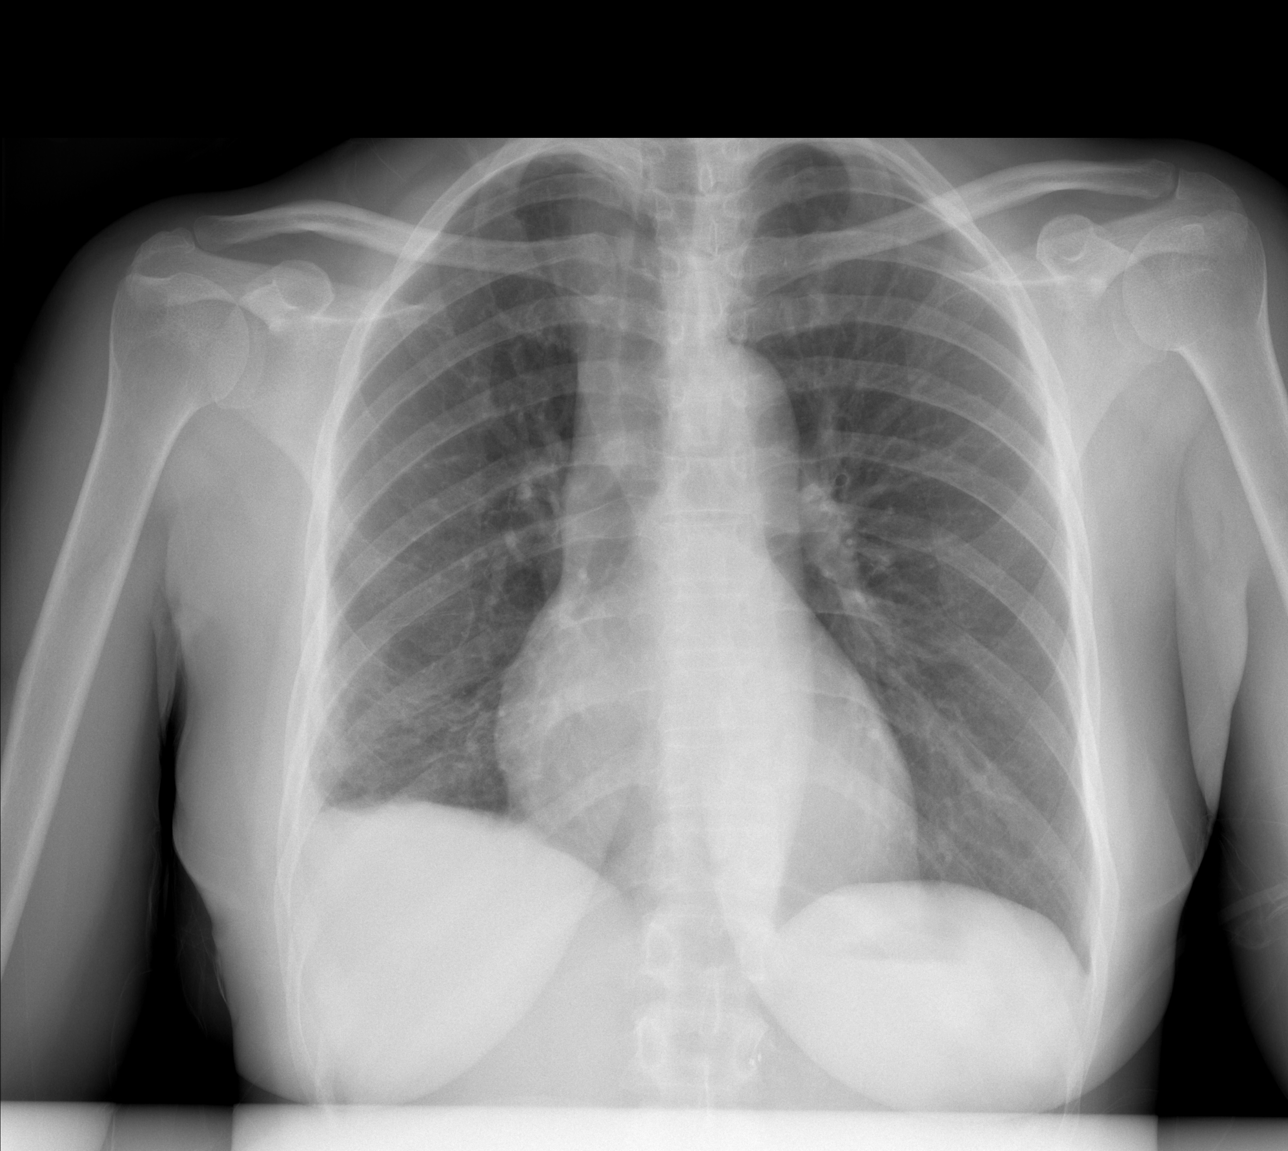

[w abdomen upright]
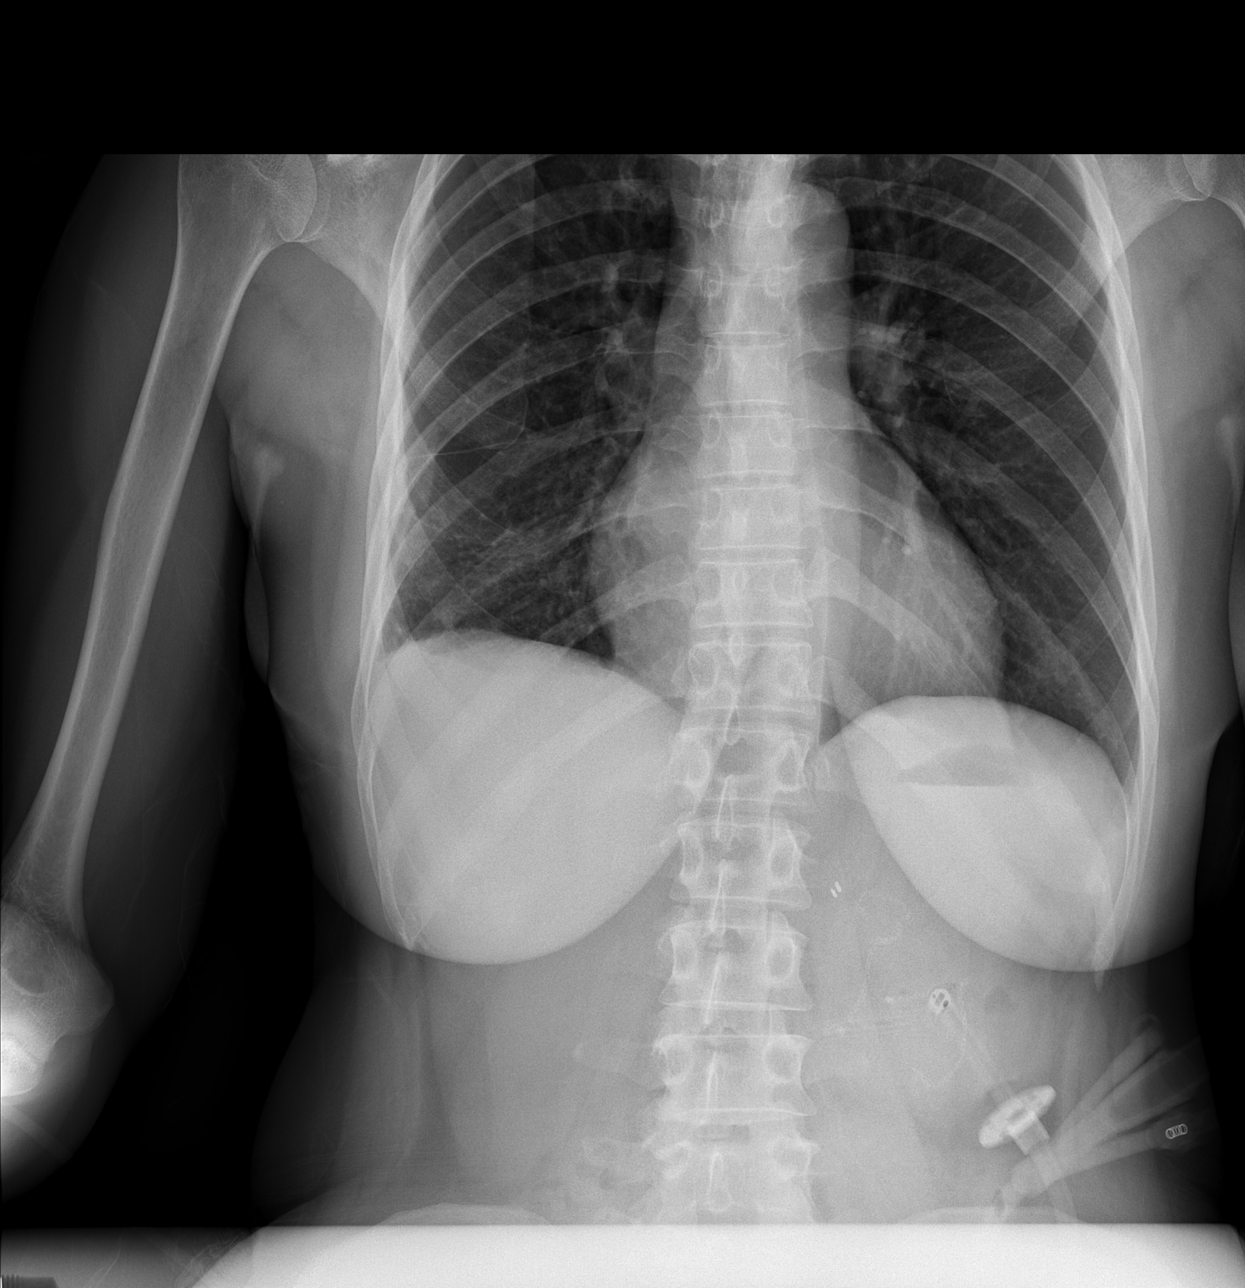

[t abdomen supine]
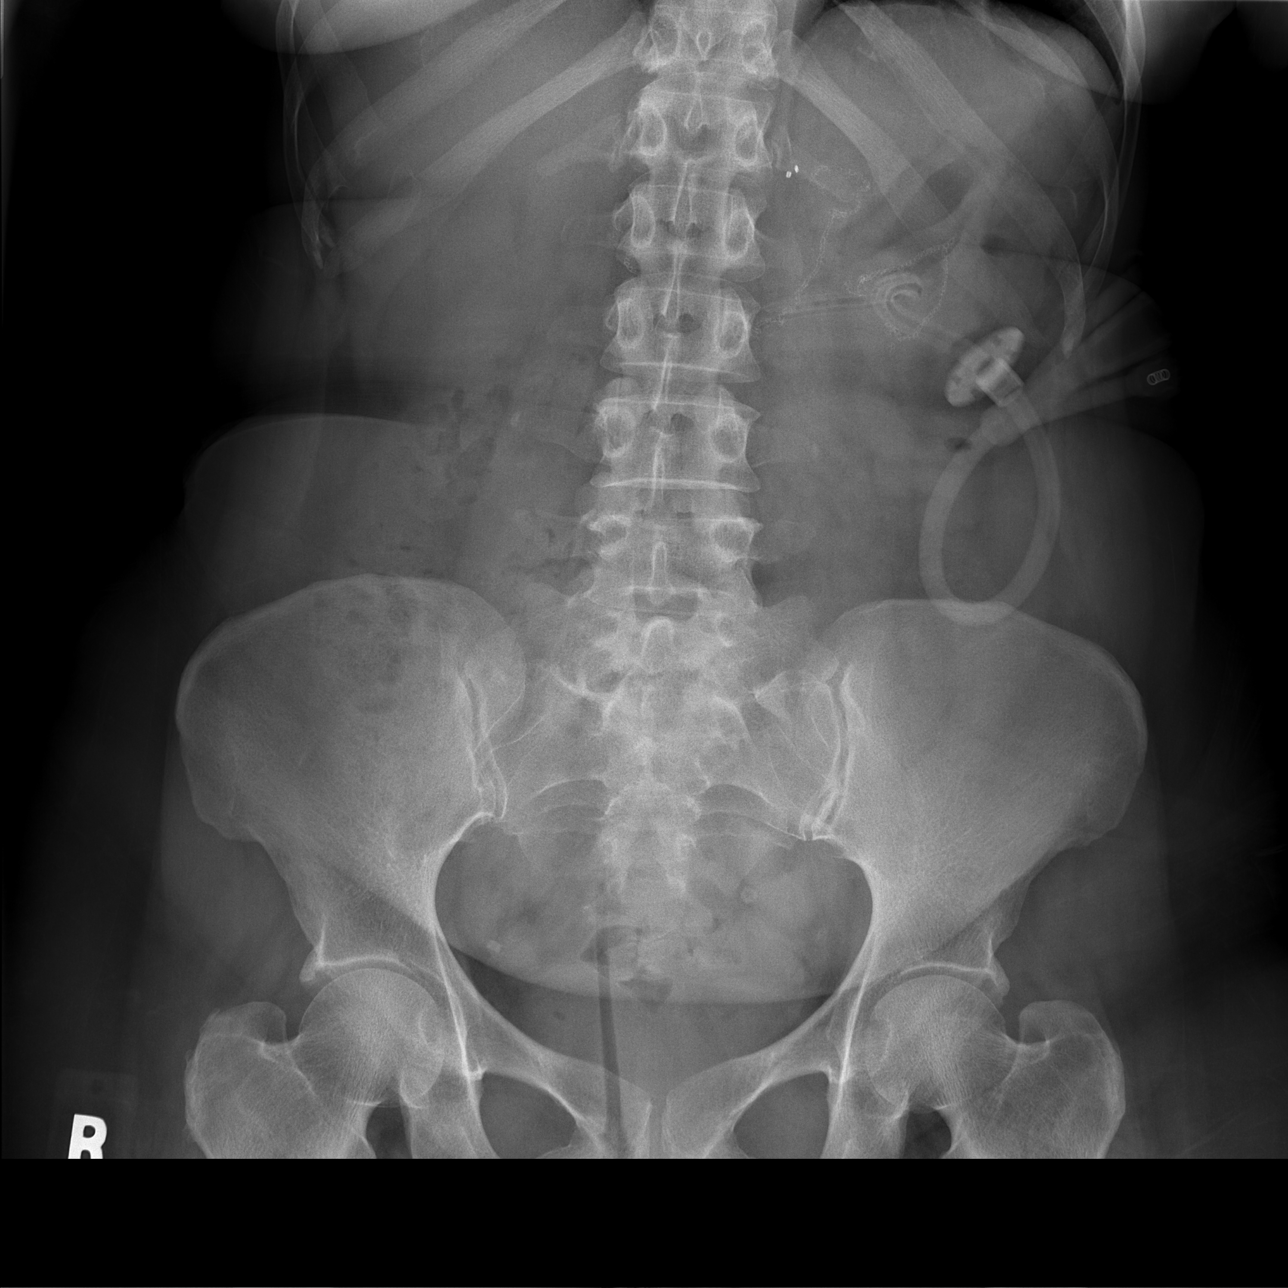

[3 of 3 positions shown; findings below may reference images not displayed]

FINDINGS: Similar appearance of right basilar scarring. Otherwise, the lungs
are clear. Stable cardiac and mediastinal contours. No free air.
Percutaneous gastrostomy tube appears in similar position and
projects over the stomach. Staple lines are noted in the left upper
quadrant suggesting partial gastric resection. No evidence of
ascites or bowel obstruction. No acute osseous abnormality.
IMPRESSION: 1. No acute cardiopulmonary process.
2. Stable right basilar atelectasis versus scarring.
3. Stable position of gastrostomy tube.
4. Nonobstructed bowel gas pattern.
5. No evidence of free air or ascites.

## 2015-08-11 IMAGING — XA IR REPLACE G-TUBE/COLONIC TUBE
1 series · 1 of 1 positions shown · non-contrast
Comparison: none

CLINICAL DATA: Occluded gastrojejunostomy tube

[Series 300: line placements · 1 of 1 slices shown]
[im 1/1]
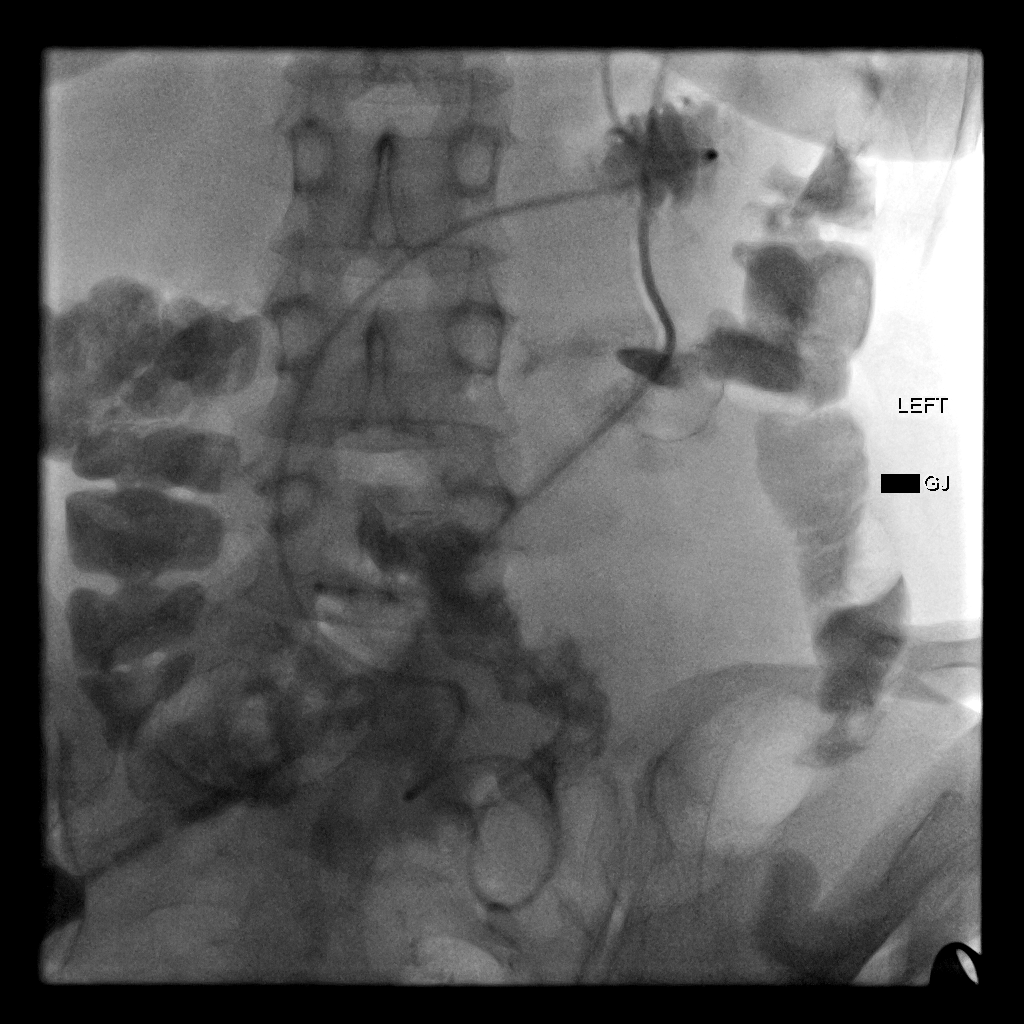

[1 of 1 positions shown; findings below may reference images not displayed]

EXAM:
GASTROSTOMY CATHETER REPLACEMENT

FLUOROSCOPY TIME:  42 seconds.

MEDICATIONS AND MEDICAL HISTORY:
None

ANESTHESIA/SEDATION:
Moderate sedation time: 9 minutes

CONTRAST:  10 cc Omnipaque 300

PROCEDURE:
The existing gastrojejunostomy tube was exchanged over a stiff
glidewire for a new 18 French gastrojejunostomy tube. The balloon
was insufflated with 10 cc saline. Contrast was injected.
FINDINGS: The jejunostomy tube portion traverses through a Billroth
anastomosis and the tip is in the jejunum.

COMPLICATIONS:
None
IMPRESSION: Successful gastrojejunostomy tube exchange.

## 2015-08-12 ENCOUNTER — Ambulatory Visit: Payer: Self-pay | Admitting: *Deleted

## 2015-08-12 ENCOUNTER — Telehealth: Payer: Self-pay | Admitting: *Deleted

## 2015-08-12 NOTE — Telephone Encounter (Signed)
CALLED PATIENT TO INFORM THAT CT HAS BEEN RESCHEDULED FOR 08-18-15 AND HER FU VISIT WITH DR. Isidore Moos ON 08-19-15 @ 11 AM WITH DR. Isidore Moos, SPOKE WITH PATIENT AND SHE IS AWARE OF THESE CHANGES AND IS GOOD WITH THESE CHANGES.

## 2015-08-13 ENCOUNTER — Ambulatory Visit (HOSPITAL_COMMUNITY): Payer: Medicaid Other

## 2015-08-14 ENCOUNTER — Ambulatory Visit: Admission: RE | Admit: 2015-08-14 | Payer: Medicaid Other | Source: Ambulatory Visit | Admitting: Radiation Oncology

## 2015-08-18 ENCOUNTER — Ambulatory Visit
Admission: RE | Admit: 2015-08-18 | Discharge: 2015-08-18 | Disposition: A | Discharge status: Home / Self Care | Payer: Medicaid Other | Source: Ambulatory Visit | Attending: Radiation Oncology | Admitting: Radiation Oncology

## 2015-08-18 ENCOUNTER — Other Ambulatory Visit: Payer: Self-pay

## 2015-08-18 ENCOUNTER — Other Ambulatory Visit (HOSPITAL_COMMUNITY): Payer: Self-pay | Admitting: Interventional Radiology

## 2015-08-18 ENCOUNTER — Ambulatory Visit (HOSPITAL_COMMUNITY): Payer: Medicaid Other

## 2015-08-18 ENCOUNTER — Ambulatory Visit (HOSPITAL_COMMUNITY)
Admission: RE | Admit: 2015-08-18 | Discharge: 2015-08-18 | Disposition: A | Discharge status: Home / Self Care | Payer: Medicaid Other | Source: Ambulatory Visit | Attending: Interventional Radiology | Admitting: Interventional Radiology

## 2015-08-18 ENCOUNTER — Encounter (HOSPITAL_COMMUNITY): Payer: Self-pay

## 2015-08-18 ENCOUNTER — Ambulatory Visit (HOSPITAL_COMMUNITY)
Admission: RE | Admit: 2015-08-18 | Discharge: 2015-08-18 | Disposition: A | Discharge status: Home / Self Care | Payer: Medicaid Other | Source: Ambulatory Visit | Attending: Radiation Oncology | Admitting: Radiation Oncology

## 2015-08-18 DIAGNOSIS — C163 Malignant neoplasm of pyloric antrum: Secondary | ICD-10-CM

## 2015-08-18 DIAGNOSIS — J430 Unilateral pulmonary emphysema [MacLeod's syndrome]: Secondary | ICD-10-CM | POA: Insufficient documentation

## 2015-08-18 DIAGNOSIS — Z9889 Other specified postprocedural states: Secondary | ICD-10-CM | POA: Insufficient documentation

## 2015-08-18 LAB — BUN AND CREATININE (CC13)
BUN: 13.8 mg/dL (ref 7.0–26.0)
CREATININE: 1.1 mg/dL (ref 0.6–1.1)
EGFR: 68 mL/min/{1.73_m2} — ABNORMAL LOW (ref >90)

## 2015-08-18 MED ORDER — IOPAMIDOL (ISOVUE-370) INJECTION 76%
100.0000 mL | Freq: Once | INTRAVENOUS | Status: AC | PRN
  Administered 2015-08-18: 100 mL via INTRAVENOUS

## 2015-08-18 NOTE — Procedures (Signed)
Under sterile conditions 4 french angiocath placed into right brachial vein via US guidance to utilize for CT angio study. Medication used- 1% lidocaine to skin/SQ tissue. No immediate complications.

## 2015-08-19 ENCOUNTER — Ambulatory Visit
Admission: RE | Admit: 2015-08-19 | Discharge: 2015-08-19 | Disposition: A | Discharge status: Home / Self Care | Payer: Medicaid Other | Source: Ambulatory Visit | Attending: Radiation Oncology | Admitting: Radiation Oncology

## 2015-08-19 ENCOUNTER — Encounter: Payer: Self-pay | Admitting: Radiation Oncology

## 2015-08-19 VITALS — BP 115/74 | HR 84 | Temp 98.3°F | Ht 60.0 in | Wt 123.7 lb

## 2015-08-19 DIAGNOSIS — C163 Malignant neoplasm of pyloric antrum: Secondary | ICD-10-CM

## 2015-08-19 DIAGNOSIS — C169 Malignant neoplasm of stomach, unspecified: Secondary | ICD-10-CM | POA: Insufficient documentation

## 2015-08-19 NOTE — Progress Notes (Signed)
[name] presents for follow up of radiation completed 06/17/2014 to her Post-op gastric region/ nodes. She denies pain right now, she does report pain at her old feeding tube site and Left rib cage. Dr. Marin Olp is aware of this and believes it may be scar tissue. She reports she is eating well. When she becomes too full, she has nausea. She reports stomach distention with bread. She states she vomited yesterday, and says that recently she has been vomiting every once in awhile for the past several months. She cannot relate the vomiting to any specific reason, but does state she vomited once after drinking a sip of wine. She reports an improved energy level, but tried to stay out of the sun, because it drains her.   BP 115/74 mmHg  Pulse 84  Temp(Src) 98.3 F (36.8 C)  Ht 5' (1.524 m)  Wt 123 lb 11.2 oz (56.11 kg)  BMI 24.16 kg/m2  SpO2 100%   Wt Readings from Last 3 Encounters:  08/19/15 123 lb 11.2 oz (56.11 kg)  06/26/15 114 lb (51.71 kg)  06/23/15 111 lb 11.2 oz (50.667 kg)

## 2015-08-19 NOTE — Progress Notes (Signed)
Radiation Oncology         [id number]) [id number] ________________________________  Name: Paula [name] MRN: [id number]  Date: 08/19/2015  DOB: 05-10-1965  Follow-Up Visit Note  Outpatient  CC: Pcp Not In System  Paula Bears, MD  Diagnosis and Prior Radiotherapy: C16.3 ICD code    ICD-9-CM ICD-10-CM   1. Carcinoma of stomach ([facility]) 151.9 C16.9   2. 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 presents for follow up of radiation completed 06/17/2014 to her Post-op gastric region/ nodes and to discuss 08/18/15 CT angiogram scan. This was recommended to work up vascular malformation found on her previous scan months ago.  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 Stable changes from gastric surgery. No sign of cancer recurrence   She denies pain right now, she does report pain at her old feeding tube site and Left rib cage. Dr. Marin [name] is aware of this and believes it may be scar tissue. She reports she is eating well. When she becomes too full, she has nausea. She reports stomach distention with bread. She states she vomited yesterday, and says that recently she has been vomiting every once in awhile for the past several months. She cannot relate the vomiting to any specific reason, but does state she vomited once after drinking a sip of wine. She reports an improved energy level, but tried to stay out of the sun, because it drains her.   Sees Paula [name] on July 6th.      ALLERGIES:  is allergic to tape.  Meds: Current Outpatient Prescriptions  Medication Sig Dispense Refill  . carvedilol (COREG)  6.25 MG tablet Take 6.25 mg by mouth 2 (two) times daily with a meal.    . metoCLOPramide (REGLAN) 10 MG tablet Take 1 tablet (10 mg total) by mouth 4 (four) times daily -  before meals and at bedtime. 120 tablet 3  . Multiple Vitamins-Minerals (MULTIVITAMIN & MINERAL PO) Take 1 tablet by mouth daily.    Marland Kitchen OLANZapine (ZYPREXA) 10 MG tablet Take 1 tablet (10 mg total) by mouth at bedtime. 30 tablet 1  . omeprazole (PRILOSEC) 20 MG capsule Take 20 mg by mouth daily.  5  . ondansetron (ZOFRAN) 4 MG tablet Take 2 tablets (8 mg total) by mouth 2 (two) times daily. 60 tablet 3  . promethazine (PHENERGAN) 12.5 MG tablet Take 12.5 mg by mouth every 6 (six) hours as needed.  2  . rivaroxaban (XARELTO) 20 MG TABS tablet Take 1 tablet (20 mg total) by mouth daily with supper. 30 tablet 6  . traMADol (ULTRAM) 50 MG tablet Take 1 tablet (50 mg total) by mouth 3 (three) times daily as needed. 90 tablet 0  . Pancrelipase, Lip-Prot-Amyl, 24000 units CPEP Take 1 capsule (24,000 Units total) by mouth 3 (three) times daily before meals. (Patient not taking: Reported on 08/19/2015) 180 capsule 5  . [DISCONTINUED] pantoprazole (PROTONIX) 40 MG tablet Take 1 tablet (40 mg total) by mouth 2 (two) times daily before a meal. (Patient not taking: Reported on 07/18/2014) 60 tablet 3  . [DISCONTINUED] temazepam (RESTORIL) 30 MG capsule Take  1 capsule (30 mg total) by mouth at bedtime as needed for sleep. (Patient not taking: Reported on 07/18/2014) 30 capsule 0   Current Facility-Administered Medications  Medication Dose Route Frequency Provider Last Rate Last Dose  . medroxyPROGESTERone (DEPO-PROVERA) injection 150 mg  150 mg Intramuscular Q90 days Paula Oman, MD   150 mg at 06/23/15 [id number]    Physical Findings: The patient is in no acute distress. Patient is alert and oriented.  height is 5' (1.524 m) and weight is 123 lb 11.2 oz (56.11 kg). Her temperature is 98.3 F (36.8 C). Her blood pressure is 115/74 and her pulse  is 84. Her oxygen saturation is 100%.    Lungs are clear to auscultation bilaterally. Heart has regular rate and rhythm. No palpable cervical or supraclavicularadenopathy. Some small hyperpigmented spots over her tongue. Oral cavity clear. No edema in extremities. Midline scar in the upper abdomen. Abdomen is soft, non-tender, little firmness in the left upper quadrant  -   this is likely just post-operative changes.    Lab Findings: Lab Results  Component Value Date   WBC 2.8* 06/26/2015   HGB 10.3* 06/26/2015   HCT 30.9* 06/26/2015   MCV 89 06/26/2015   PLT 236 06/26/2015    Radiographic Findings: Ct Angio Abdomen W/cm &/or Wo Contrast  08/18/2015  CLINICAL DATA:  Gastric cancer (antrum of stomach) Vomiting x 1.40months. Nausea Hx distal gastrectomy, tubal ligation. Vascular anomaly. Protocol per Dr. Laurence [name] EXAM: CT ANGIOGRAPHY CHEST AND ABDOMEN  WITH CONTRAST TECHNIQUE: Multidetector CT imaging of the chest and abdomen was performed using the standard protocol during bolus administration of intravenous contrast. Multiplanar CT image reconstructions and MIPs were obtained to evaluate the vascular anatomy. CONTRAST:  153ml [id number] COMPARISON:  12/09/2014 and prior studies FINDINGS: CHEST Vascular: Right arm IV contrast administration. SVC is patent. Right atrium and right ventricle are nondilated. There is good contrast opacification of pulmonary artery branches. No filling defects defects to suggest acute PE. 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 Enlarged right phrenic and intercostal artery branches provide supply to the right lower lobe. Patent pulmonary veins drain into the left atrium. Adequate contrast opacification of the thoracic aorta with no evidence of dissection, aneurysm, or stenosis. There is bovine variant brachiocephalic arch anatomy without proximal stenosis. Mediastinum/Lymph Nodes: No masses or  pathologically enlarged lymph nodes identified. No pericardial effusion. Lungs/Pleura: No pulmonary mass, infiltrate, or effusion. Bulla in the right lower lobe lateral basal segment stable since 09/11/2013. Pleural thickening lateral to the basilar segments of the right lower lobe stable. Musculoskeletal: No chest wall mass or suspicious bone lesions identified. ABDOMEN Arterial Aorta: Minimal plaque in the infrarenal segment. No aneurysm, dissection, or stenosis. Celiac axis: Patent. An enlarged right phrenic artery arises just cephalad to the celiac axis, providing collateral supply to the right lower lobe as detailed above. Superior mesenteric: Patent, with replaced right hepatic arterial plaque, an anatomic variant. Left renal:           Single, patent Right renal:          Duplicated, inferior dominant, both patent. Inferior mesenteric:  Patent Left iliac:           Visualized segments unremarkable. Right iliac:          Visualized segments unremarkable. Venous Dedicated venous phase imaging not obtained. Patent bilateral renal veins and portal vein noted. Review of the MIP images confirms the above findings. Nonvasular Hepatobiliary: No  masses or other significant abnormality. Pancreas: Moderate diffuse atrophy, without focal lesion or ductal dilatation. Spleen: Within normal limits in size and appearance. Adrenals/Urinary Tract: No masses identified. No evidence of hydronephrosis. Stomach/Bowel: Changes of gastric antrectomy and bypass. Visualized portions of small bowel and colon are nondilated. Lymphatic: No pathologically enlarged lymph nodes. Other: No ascites.  No free air. Musculoskeletal: No suspicious bone lesions identified. IMPRESSION: 1. Negative for mass or adenopathy. 2. Hypoplastic right lower lobe pulmonary artery, with enlarged collateral supply from right phrenic and intercostal branches, as seen on scans dating back to 09/13/2013. 3. Stable changes of gastric antrectomy. Electronically  Signed   By: Lucrezia Europe M.D.   On: 08/18/2015 16:52   Ir Venipuncture 58yrs/older By Md  08/18/2015  INDICATION: Patient with history of gastric cancer and poor venous access; venous access requested prior to CT angiogram. EXAM: VENIPUNCTURE BY MD; IR ULTRASOUND GUIDANCE VASC ACCESS RIGHT MEDICATIONS: 1% lidocaine to skin and subcutaneous tissue ANESTHESIA/SEDATION: None FLUOROSCOPY TIME:  Fluoroscopy Time:None COMPLICATIONS: None immediate. PROCEDURE: Informed written consent was obtained from the patient after a thorough discussion of the procedural risks, benefits and alternatives. All questions were addressed. Maximal Sterile Barrier Technique was utilized including caps, mask, sterile gowns, sterile gloves, sterile drape, hand hygiene and skin antiseptic. A timeout was performed prior to the initiation of the procedure. Under ultrasound guidance and following the administration of 1% lidocaine to the skin and subcutaneous tissue the right brachial vein was accessed and a 4 [place] angio catheter was placed and secured to the skin . The catheter flushed appropriately with good return of venous blood. IMPRESSION: Successful placement of a 4 French angiocath into the right brachial vein for venous access prior to CT angiogram. No immediate complications. Read by: Rowe Robert, PA-C Electronically Signed   By: Jacqulynn Cadet M.D.   On: 08/18/2015 16:04   Ir US Guide Vasc Access Right  08/18/2015  INDICATION: Patient with history of gastric cancer and poor venous access; venous access requested prior to CT angiogram. EXAM: VENIPUNCTURE BY MD; IR ULTRASOUND GUIDANCE VASC ACCESS RIGHT MEDICATIONS: 1% lidocaine to skin and subcutaneous tissue ANESTHESIA/SEDATION: None FLUOROSCOPY TIME:  Fluoroscopy Time:None COMPLICATIONS: None immediate. PROCEDURE: Informed written consent was obtained from the patient after a thorough discussion of the procedural risks, benefits and alternatives. All questions were addressed.  Maximal Sterile Barrier Technique was utilized including caps, mask, sterile gowns, sterile gloves, sterile drape, hand hygiene and skin antiseptic. A timeout was performed prior to the initiation of the procedure. Under ultrasound guidance and following the administration of 1% lidocaine to the skin and subcutaneous tissue the right brachial vein was accessed and a 4 [place] angio catheter was placed and secured to the skin . The catheter flushed appropriately with good return of venous blood. IMPRESSION: Successful placement of a 4 French angiocath into the right brachial vein for venous access prior to CT angiogram. No immediate complications. Read by: Rowe Robert, PA-C Electronically Signed   By: Jacqulynn Cadet M.D.   On: 08/18/2015 16:04   Ct Angio Chest Aorta W/cm &/or Wo/cm  08/18/2015  CLINICAL DATA:  Gastric cancer (antrum of stomach) Vomiting x 1.71months. Nausea Hx distal gastrectomy, tubal ligation. Vascular anomaly. Protocol per Dr. Laurence [name] EXAM: CT ANGIOGRAPHY CHEST AND ABDOMEN  WITH CONTRAST TECHNIQUE: Multidetector CT imaging of the chest and abdomen was performed using the standard protocol during bolus administration of intravenous contrast. Multiplanar CT image reconstructions and MIPs were obtained to evaluate the vascular anatomy. CONTRAST:  155ml isovue370 COMPARISON:  12/09/2014 and prior studies FINDINGS: CHEST Vascular: Right arm IV contrast administration. SVC is patent. Right atrium and right ventricle are nondilated. There is good contrast opacification of pulmonary artery branches. No filling defects defects to suggest acute PE. 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 Enlarged right phrenic and intercostal artery branches provide supply to the right lower lobe. Patent pulmonary veins drain into the left atrium. Adequate contrast opacification of the thoracic aorta with no evidence of dissection, aneurysm, or  stenosis. There is bovine variant brachiocephalic arch anatomy without proximal stenosis. Mediastinum/Lymph Nodes: No masses or pathologically enlarged lymph nodes identified. No pericardial effusion. Lungs/Pleura: No pulmonary mass, infiltrate, or effusion. Bulla in the right lower lobe lateral basal segment stable since 09/11/2013. Pleural thickening lateral to the basilar segments of the right lower lobe stable. Musculoskeletal: No chest wall mass or suspicious bone lesions identified. ABDOMEN Arterial Aorta: Minimal plaque in the infrarenal segment. No aneurysm, dissection, or stenosis. Celiac axis: Patent. An enlarged right phrenic artery arises just cephalad to the celiac axis, providing collateral supply to the right lower lobe as detailed above. Superior mesenteric: Patent, with replaced right hepatic arterial plaque, an anatomic variant. Left renal:           Single, patent Right renal:          Duplicated, inferior dominant, both patent. Inferior mesenteric:  Patent Left iliac:           Visualized segments unremarkable. Right iliac:          Visualized segments unremarkable. Venous Dedicated venous phase imaging not obtained. Patent bilateral renal veins and portal vein noted. Review of the MIP images confirms the above findings. Nonvasular Hepatobiliary: No masses or other significant abnormality. Pancreas: Moderate diffuse atrophy, without focal lesion or ductal dilatation. Spleen: Within normal limits in size and appearance. Adrenals/Urinary Tract: No masses identified. No evidence of hydronephrosis. Stomach/Bowel: Changes of gastric antrectomy and bypass. Visualized portions of small bowel and colon are nondilated. Lymphatic: No pathologically enlarged lymph nodes. Other: No ascites.  No free air. Musculoskeletal: No suspicious bone lesions identified. IMPRESSION: 1. Negative for mass or adenopathy. 2. Hypoplastic right lower lobe pulmonary artery, with enlarged collateral supply from right phrenic  and intercostal branches, as seen on scans dating back to 09/13/2013. 3. Stable changes of gastric antrectomy. Electronically Signed   By: Lucrezia Europe M.D.   On: 08/18/2015 16:52    Impression/Plan: NED on clinical exam and on recent imaging.   The patient has been experiencing vomiting as of late and has visited her gastroenterologist regarding this. The source of her vomiting has not been determined at this time. I advised her to keep a food journal to track foods that maybe causing her nausea and to avoid those foods if identified.  F/u PRN with GI.  The patient is scheduled to meet with Paula [name] on July 6th. I advised for alcohol and smoking cessation .  I defer to Dr Paula [name] on any future scans as clinically indicated.  I will see her back in a year- sooner if needed.  _____________________________________   Eppie Gibson, MD   This document serves as a record of services personally performed by Eppie Gibson, MD. It was created on her behalf by Derek Mound, a trained medical scribe. The creation of this record is based on the scribe's personal observations and the provider's statements to them. This document has been checked and approved  by the attending provider.

## 2015-08-27 ENCOUNTER — Ambulatory Visit (HOSPITAL_BASED_OUTPATIENT_CLINIC_OR_DEPARTMENT_OTHER): Payer: Medicaid Other | Admitting: Hematology & Oncology

## 2015-08-27 ENCOUNTER — Encounter: Payer: Self-pay | Admitting: Hematology & Oncology

## 2015-08-27 ENCOUNTER — Other Ambulatory Visit (HOSPITAL_BASED_OUTPATIENT_CLINIC_OR_DEPARTMENT_OTHER): Payer: Medicaid Other

## 2015-08-27 ENCOUNTER — Ambulatory Visit: Payer: Medicaid Other

## 2015-08-27 VITALS — BP 128/79 | HR 65 | Temp 97.9°F | Resp 16 | Ht 60.0 in | Wt 124.0 lb

## 2015-08-27 DIAGNOSIS — Z85028 Personal history of other malignant neoplasm of stomach: Secondary | ICD-10-CM

## 2015-08-27 DIAGNOSIS — D500 Iron deficiency anemia secondary to blood loss (chronic): Secondary | ICD-10-CM | POA: Diagnosis present

## 2015-08-27 DIAGNOSIS — C169 Malignant neoplasm of stomach, unspecified: Secondary | ICD-10-CM

## 2015-08-27 DIAGNOSIS — M792 Neuralgia and neuritis, unspecified: Secondary | ICD-10-CM

## 2015-08-27 LAB — IRON AND TIBC
%SAT: 53 % (ref 21–57)
Iron: 119 ug/dL (ref 41–142)
TIBC: 225 ug/dL — ABNORMAL LOW (ref 236–444)
UIBC: 106 ug/dL — ABNORMAL LOW (ref 120–384)

## 2015-08-27 LAB — CMP (CANCER CENTER ONLY)
ALK PHOS: 77 U/L (ref 26–84)
ALT: 20 U/L (ref 10–47)
AST: 22 U/L (ref 11–38)
Albumin: 3.6 g/dL (ref 3.3–5.5)
BILIRUBIN TOTAL: 1.1 mg/dL (ref 0.20–1.60)
BUN: 10 mg/dL (ref 7–22)
CO2: 27 mEq/L (ref 18–33)
CREATININE: 1.1 mg/dL (ref 0.6–1.2)
Calcium: 9.1 mg/dL (ref 8.0–10.3)
Chloride: 103 mEq/L (ref 98–108)
Glucose, Bld: 70 mg/dL — ABNORMAL LOW (ref 73–118)
Potassium: 3.7 mEq/L (ref 3.3–4.7)
SODIUM: 137 meq/L (ref 128–145)
TOTAL PROTEIN: 6.7 g/dL (ref 6.4–8.1)

## 2015-08-27 LAB — CBC WITH DIFFERENTIAL (CANCER CENTER ONLY)
BASO#: 0 10*3/uL (ref 0.0–0.2)
BASO%: 0.7 % (ref 0.0–2.0)
EOS%: 1.5 % (ref 0.0–7.0)
Eosinophils Absolute: 0 10*3/uL (ref 0.0–0.5)
HCT: 36 % (ref 34.8–46.6)
HEMOGLOBIN: 11.7 g/dL (ref 11.6–15.9)
LYMPH#: 0.7 10*3/uL — ABNORMAL LOW (ref 0.9–3.3)
LYMPH%: 26.9 % (ref 14.0–48.0)
MCH: 29.4 pg (ref 26.0–34.0)
MCHC: 32.5 g/dL (ref 32.0–36.0)
MCV: 91 fL (ref 81–101)
MONO#: 0.3 10*3/uL (ref 0.1–0.9)
MONO%: 11.8 % (ref 0.0–13.0)
NEUT%: 59.1 % (ref 39.6–80.0)
NEUTROS ABS: 1.6 10*3/uL (ref 1.5–6.5)
PLATELETS: 230 10*3/uL (ref 145–400)
RBC: 3.98 10*6/uL (ref 3.70–5.32)
RDW: 12.9 % (ref 11.1–15.7)
WBC: 2.7 10*3/uL — AB (ref 3.9–10.0)

## 2015-08-27 LAB — FERRITIN: FERRITIN: 226 ng/mL (ref 9–269)

## 2015-08-27 MED ORDER — LIDOCAINE 5 % EX PTCH: 1.0000 | MEDICATED_PATCH | CUTANEOUS | Status: DC

## 2015-08-28 ENCOUNTER — Telehealth: Payer: Self-pay | Admitting: Nurse Practitioner

## 2015-08-28 LAB — RETICULOCYTES: RETICULOCYTE COUNT: 1.3 % (ref 0.6–2.6)

## 2015-08-28 NOTE — Telephone Encounter (Addendum)
Pt verbalized understanding and appreciation.----- Message from Volanda Napoleon, MD sent at 08/27/2015 12:00 PM EDT ----- Call - iron level is great!!!!  pete

## 2015-08-28 NOTE — Progress Notes (Signed)
[place] [phone]   [phone]  OFFICE PROGRESS NOTE  Pcp Not In System No address on file  DIAGNOSIS: Stage IIA (T1a, N2, M0) gastric adenocarcinoma diagnosed in July of 2015  PRIOR THERAPY: 1) Status post diagnostic laparoscopy, distal gastrectomy with billroth II anastamosis and feeding gastrojejunostomy tube under the care of Dr. Barry Dienes on 09/20/2013. 2) Adjuvant systemic chemotherapy with 5-FU 600 mg/M2 with leucovorin on days 1, 8, 15, 22, 29 and 36 every 8 weeks. First dose 11/19/2013. 3) Adjuvant concurrent chemoradiation with Xeloda 650 MG/M2 by mouth twice a day for 5 days every week with radiation. The patient completed the course of radiotherapy but she declined to take Xeloda during her radiation.  CURRENT THERAPY: None.  INTERVAL HISTORY: Paula [name] 50 y.o. female returns to the clinic today for followup . She actually feels better. We did find that she is iron deficient. We saw her back in late March, her ferritin was on 25 and her iron saturation was only 6%. We did go ahead and give her some IV iron. This did make her feel better.  She recently had a CT angiogram done of the abdomen and pelvis. This did not show any type of vascular abnormality.  She still having some discomfort where she had a G-tube. I will see if a Lidoderm patch might help the discomfort.  She has had no fever. She is gaining some weight. Her appetite might be a little bit better.  She's not had any diarrhea. There maybe a little bit of constipation.  Overall, her performance status is ECOG 1.  MEDICAL HISTORY: Past Medical History  Diagnosis Date  . Headache(784.0) 2011    Migraines  . Esophageal reflux     On omeprazole  . Costochondritis   . Hypertension     started around 2011  . Ovarian cyst   . Stomach cancer ([place])   . Family history of cancer   . Heart murmur   . On antineoplastic chemotherapy     5-FU and leucovorin    . Pneumonia   . Pulmonary emboli ([place])   . Iron deficiency anemia due to chronic blood loss 05/25/2015  . 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  MEDICATIONS:  Current Outpatient Prescriptions  Medication Sig Dispense Refill  . carvedilol (COREG) 6.25 MG tablet Take 6.25 mg by mouth 2 (two) times daily with a meal.    . metoCLOPramide (REGLAN) 10 MG tablet Take 1 tablet (10 mg total) by mouth 4 (four) times daily -  before meals and at bedtime. 120 tablet 3  . Multiple Vitamins-Minerals (MULTIVITAMIN & MINERAL PO) Take 1 tablet by mouth daily.    Marland Kitchen OLANZapine (ZYPREXA) 10 MG tablet Take 1 tablet (10 mg total) by mouth at bedtime. 30 tablet 1  . omeprazole (PRILOSEC) 20 MG capsule Take 20 mg by mouth daily.  5  . ondansetron (ZOFRAN) 4 MG tablet Take 2 tablets (8 mg total) by mouth 2 (two) times daily. 60 tablet 3  . Pancrelipase, Lip-Prot-Amyl, 24000 units CPEP Take 1 capsule (24,000 Units total) by mouth 3 (three) times daily before meals. 180 capsule 5  . promethazine (PHENERGAN) 12.5 MG tablet Take 12.5 mg by mouth every 6 (six) hours as needed.  2  . rivaroxaban (XARELTO) 20 MG TABS tablet Take 1 tablet (20 mg total) by mouth daily with supper. 30 tablet 6  . traMADol (ULTRAM) 50 MG  tablet Take 1 tablet (50 mg total) by mouth 3 (three) times daily as needed. 90 tablet 0  . lidocaine (LIDODERM) 5 % Place 1 patch onto the skin daily. Remove & Discard patch within 12 hours or as directed by MD 30 patch 3  . [DISCONTINUED] pantoprazole (PROTONIX) 40 MG tablet Take 1 tablet (40 mg total) by mouth 2 (two) times daily before a meal. (Patient not taking: Reported on 07/18/2014) 60 tablet 3  . [DISCONTINUED] temazepam (RESTORIL) 30 MG capsule Take 1 capsule (30 mg total) by mouth at bedtime as needed for sleep. (Patient not taking: Reported on 07/18/2014) 30 capsule 0   Current Facility-Administered Medications  Medication Dose Route Frequency Provider Last Rate Last  Dose  . medroxyPROGESTERone (DEPO-PROVERA) injection 150 mg  150 mg Intramuscular Q90 days Osborne Oman, MD   150 mg at 06/23/15 [id number]    SURGICAL HISTORY:  Past Surgical History  Procedure Laterality Date  . Oophorectomy      Around 1985 left ovary removal  . Tonsillectomy      Around 1994  . Tubal ligation    . Esophagogastroduodenoscopy N/A 09/16/2013    Procedure: ESOPHAGOGASTRODUODENOSCOPY (EGD);  Surgeon: Wonda Horner, MD;  Location: [facility];  Service: Endoscopy;  Laterality: N/A;  . Laparoscopy N/A 09/20/2013    Procedure: DIAGNOSTIC LAPAROSCOPY,DISTAL GASTRECTOMY AND FEEDING GASTROJEJUNOSTOMY;  Surgeon: [name], MD;  Location: [facility];  Service: General;  Laterality: N/A;  . Portacath placement Left 11/18/2013    Procedure: INSERTION PORT-A-CATH;  Surgeon: [name], MD;  Location: [facility];  Service: General;  Laterality: Left;    REVIEW OF SYSTEMS:     PHYSICAL EXAMINATION: Well-developed and well-nourished African American female in no obvious distress. Vital signs temperature of 98.6. Pulse 60. Blood pressure 123/72. Weight is 114 pounds. Head and neck exam shows no ocular or oral lesions. There is no adenopathy in her neck. There is no scleral icterus. Thyroid is nonpalpable. Lungs are clear. Cardiac exam regular rate and rhythm with no murmurs, rubs or bruits. Abdomen is soft. She has good bowel sounds. There is no fluid wave. There is no palpable liver or spleen tip. She has well-healed laparotomy scars. Extremity shows no clubbing, cyanosis or edema. Back exam shows no tenderness over the spine, ribs or hips. Extremities shows no clubbing, cyanosis or edema. Skin exam shows no rashes, ecchymoses or petechia.  ECOG PERFORMANCE STATUS: 1 - Symptomatic but completely ambulatory  Blood pressure 128/79, pulse 65, temperature 97.9 F (36.6 C), temperature source Oral, resp. rate 16, height 5' (1.524 m), weight 124 lb (56.246 kg).  LABORATORY DATA: Lab Results    Component Value Date   WBC 2.7* 08/27/2015   HGB 11.7 08/27/2015   HCT 36.0 08/27/2015   MCV 91 08/27/2015   PLT 230 08/27/2015      Chemistry      Component Value Date/Time   NA 137 08/27/2015 0913   NA 138 06/26/2015 0827   NA 137 04/24/2015 1209   K 3.7 08/27/2015 0913   K 3.9 06/26/2015 0827   K 4.3 04/24/2015 1209   CL 103 08/27/2015 0913   CL 103 04/24/2015 1209   CO2 27 08/27/2015 0913   CO2 21* 06/26/2015 0827   CO2 25 04/24/2015 1209   BUN 10 08/27/2015 0913   BUN 13.8 08/18/2015 1213   BUN 8 04/24/2015 1209   CREATININE 1.1 08/27/2015 0913   CREATININE 1.1 08/18/2015 1213   CREATININE 0.75 04/24/2015 1209  Component Value Date/Time   CALCIUM 9.1 08/27/2015 0913   CALCIUM 8.8 06/26/2015 0827   CALCIUM 9.2 04/24/2015 1209   ALKPHOS 77 08/27/2015 0913   ALKPHOS 74 06/26/2015 0827   ALKPHOS 72 04/24/2015 1209   AST 22 08/27/2015 0913   AST 14 06/26/2015 0827   AST 22 04/24/2015 1209   ALT 20 08/27/2015 0913   ALT 15 06/26/2015 0827   ALT 17 04/24/2015 1209   BILITOT 1.10 08/27/2015 0913   BILITOT 0.63 06/26/2015 0827   BILITOT 0.8 04/24/2015 1209       RADIOGRAPHIC STUDIES:  ASSESSMENT AND PLAN:    [name] is a very nice 50 year old African-American female with past history of stage II gastric cancer. She underwent a partial gastrectomy followed by adjuvant chemotherapy and radiation therapy. This office in April 2016. So far, his been no evidence of recurrent disease.  I would had a think that her symptoms are somewhat related to her surgery and adjuvant therapy. She just has "different plumbing" .  Hopefully, she will continue to do okay. Again the iron really helped. Her ferritin today is 226 with iron saturation of 53%.I'm glad see that her anemia is improving.  Her white cell count is down a little bit. This may be ethnic associated leukopenia.  I think we'll probably try to get her back in 2 or 3 months. She recently had the CT  scan so I don't think we have to do any additional scans on her.  I spent about 30 minutes with her.   Lattie Haw

## 2015-09-04 ENCOUNTER — Encounter: Payer: Self-pay | Admitting: *Deleted

## 2015-09-07 ENCOUNTER — Ambulatory Visit (INDEPENDENT_AMBULATORY_CARE_PROVIDER_SITE_OTHER): Payer: Medicaid Other

## 2015-09-07 VITALS — BP 101/69 | HR 72

## 2015-09-07 DIAGNOSIS — Z3042 Encounter for surveillance of injectable contraceptive: Secondary | ICD-10-CM | POA: Diagnosis present

## 2015-09-07 NOTE — Progress Notes (Signed)
Patient presented to clinic today for depo-provera given in left arm. Patient tolerated well and will follow up between 10/02-10/16. Patient verbalizes understanding.

## 2015-09-09 ENCOUNTER — Ambulatory Visit: Payer: Self-pay | Admitting: *Deleted

## 2015-09-09 DIAGNOSIS — I8393 Asymptomatic varicose veins of bilateral lower extremities: Secondary | ICD-10-CM

## 2015-09-09 NOTE — Progress Notes (Signed)
Pt informed me that she is on Xeralto for another another month, so we decided to reschedule when she is off the med. No treatment performed.

## 2015-09-23 ENCOUNTER — Other Ambulatory Visit: Payer: Self-pay | Admitting: Family

## 2015-09-23 ENCOUNTER — Other Ambulatory Visit: Payer: Self-pay | Admitting: Nurse Practitioner

## 2015-09-23 DIAGNOSIS — C169 Malignant neoplasm of stomach, unspecified: Secondary | ICD-10-CM

## 2015-09-24 ENCOUNTER — Other Ambulatory Visit: Payer: Self-pay | Admitting: Nurse Practitioner

## 2015-09-24 ENCOUNTER — Ambulatory Visit (HOSPITAL_BASED_OUTPATIENT_CLINIC_OR_DEPARTMENT_OTHER): Payer: Medicaid Other | Admitting: Family

## 2015-09-24 ENCOUNTER — Encounter: Payer: Self-pay | Admitting: Family

## 2015-09-24 ENCOUNTER — Other Ambulatory Visit (HOSPITAL_BASED_OUTPATIENT_CLINIC_OR_DEPARTMENT_OTHER): Payer: Medicaid Other

## 2015-09-24 VITALS — BP 123/84 | HR 74 | Temp 97.6°F | Resp 16 | Ht 60.0 in | Wt 125.0 lb

## 2015-09-24 DIAGNOSIS — Z85028 Personal history of other malignant neoplasm of stomach: Secondary | ICD-10-CM | POA: Diagnosis present

## 2015-09-24 DIAGNOSIS — D500 Iron deficiency anemia secondary to blood loss (chronic): Secondary | ICD-10-CM

## 2015-09-24 DIAGNOSIS — R634 Abnormal weight loss: Secondary | ICD-10-CM

## 2015-09-24 DIAGNOSIS — C169 Malignant neoplasm of stomach, unspecified: Secondary | ICD-10-CM

## 2015-09-24 DIAGNOSIS — R109 Unspecified abdominal pain: Secondary | ICD-10-CM | POA: Diagnosis not present

## 2015-09-24 DIAGNOSIS — M792 Neuralgia and neuritis, unspecified: Secondary | ICD-10-CM

## 2015-09-24 DIAGNOSIS — D508 Other iron deficiency anemias: Secondary | ICD-10-CM

## 2015-09-24 DIAGNOSIS — K8681 Exocrine pancreatic insufficiency: Secondary | ICD-10-CM

## 2015-09-24 DIAGNOSIS — K909 Intestinal malabsorption, unspecified: Secondary | ICD-10-CM

## 2015-09-24 LAB — FERRITIN: Ferritin: 226 ng/ml (ref 9–269)

## 2015-09-24 LAB — CBC WITH DIFFERENTIAL (CANCER CENTER ONLY)
BASO#: 0 10*3/uL (ref 0.0–0.2)
BASO%: 0.6 % (ref 0.0–2.0)
EOS ABS: 0.1 10*3/uL (ref 0.0–0.5)
EOS%: 2.1 % (ref 0.0–7.0)
HCT: 38.5 % (ref 34.8–46.6)
HEMOGLOBIN: 12.7 g/dL (ref 11.6–15.9)
LYMPH#: 0.9 10*3/uL (ref 0.9–3.3)
LYMPH%: 28.4 % (ref 14.0–48.0)
MCH: 29.4 pg (ref 26.0–34.0)
MCHC: 33 g/dL (ref 32.0–36.0)
MCV: 89 fL (ref 81–101)
MONO#: 0.3 10*3/uL (ref 0.1–0.9)
MONO%: 9.8 % (ref 0.0–13.0)
NEUT%: 59.1 % (ref 39.6–80.0)
NEUTROS ABS: 1.9 10*3/uL (ref 1.5–6.5)
Platelets: 234 10*3/uL (ref 145–400)
RBC: 4.32 10*6/uL (ref 3.70–5.32)
RDW: 13.1 % (ref 11.1–15.7)
WBC: 3.3 10*3/uL — AB (ref 3.9–10.0)

## 2015-09-24 LAB — COMPREHENSIVE METABOLIC PANEL
ALBUMIN: 4 g/dL (ref 3.5–5.0)
ALK PHOS: 99 U/L (ref 40–150)
ALT: 11 U/L (ref 0–55)
ANION GAP: 10 meq/L (ref 3–11)
AST: 17 U/L (ref 5–34)
BILIRUBIN TOTAL: 0.83 mg/dL (ref 0.20–1.20)
BUN: 8.6 mg/dL (ref 7.0–26.0)
CALCIUM: 9.6 mg/dL (ref 8.4–10.4)
CO2: 23 mEq/L (ref 22–29)
Chloride: 107 mEq/L (ref 98–109)
Creatinine: 1 mg/dL (ref 0.6–1.1)
EGFR: 78 mL/min/{1.73_m2} — AB (ref >90)
GLUCOSE: 65 mg/dL — AB (ref 70–140)
Potassium: 4 mEq/L (ref 3.5–5.1)
Sodium: 140 mEq/L (ref 136–145)
TOTAL PROTEIN: 7.4 g/dL (ref 6.4–8.3)

## 2015-09-24 LAB — IRON AND TIBC
%SAT: 70 % — AB (ref 21–57)
Iron: 161 ug/dL — ABNORMAL HIGH (ref 41–142)
TIBC: 231 ug/dL — ABNORMAL LOW (ref 236–444)
UIBC: 70 ug/dL — AB (ref 120–384)

## 2015-09-24 MED ORDER — LIDOCAINE 5 % EX PTCH: 1.0000 | MEDICATED_PATCH | CUTANEOUS | 3 refills | Status: DC

## 2015-09-24 MED ORDER — TRAMADOL HCL 50 MG PO TABS: 50.0000 mg | ORAL_TABLET | Freq: Three times a day (TID) | ORAL | 0 refills | Status: DC | PRN

## 2015-09-24 NOTE — Progress Notes (Signed)
Hematology and Oncology Follow Up Visit  Paula [name] [id number] 09-23-1965 50 y.o. 09/24/2015   Principle Diagnosis:  Stage IIA (T1a, N2, M0) gastric adenocarcinoma diagnosed in July of 2015  Current Therapy:   Observation    Prior Therapy:  1) Status post diagnostic laparoscopy, distal gastrectomy with billroth II anastamosis and feeding gastrojejunostomy tube under the care of Dr. Barry Dienes on 09/20/2013. 2) Adjuvant systemic chemotherapy with 5-FU 600 mg/M2 with leucovorin on days 1, 8, 15, 22, 29 and 36 every 8 weeks. First dose 11/19/2013. 3) Adjuvant concurrent chemoradiation with Xeloda 650 MG/M2 by mouth twice a day for 5 days every week with radiation. The patient completed the course of radiotherapy but she declined to take Xeloda during her radiation.  Interim History:  [name] is here today with c/o of continued abdominal discomfort around her old G-tube site. This is unchanged and Medicaid has not yet approved her lidoderm patch.  She also needs a refill on her Ultram.  She had one episode where she became nauseated and vomited a brown liquid. She takes prilosec daily which helps. She has had no other episodes and attributes this to the abdominal discomfort.  No fever, chills, cough, rash, dizziness, SOB, chest pain, palpitations or changes in bowel or bladder habits.  No swelling, tenderness, numbness or tingling in her extremities. No new aches or pains.  She has maintained an appetite and is staying hydrated. Her weight is up 1 lb since her last visit.   Medications:    Medication List       Accurate as of 09/24/15 10:17 AM. Always use your most recent med list.          carvedilol 6.25 MG tablet Commonly known as:  COREG Take 6.25 mg by mouth 2 (two) times daily with a meal.   lidocaine 5 % Commonly known as:  LIDODERM Place 1 patch onto the skin daily. Remove & Discard patch within 12 hours or as directed by MD   metoCLOPramide 10 MG  tablet Commonly known as:  REGLAN Take 1 tablet (10 mg total) by mouth 4 (four) times daily -  before meals and at bedtime.   MULTIVITAMIN & MINERAL PO Take 1 tablet by mouth daily.   OLANZapine 10 MG tablet Commonly known as:  ZYPREXA Take 1 tablet (10 mg total) by mouth at bedtime.   omeprazole 20 MG capsule Commonly known as:  PRILOSEC Take 20 mg by mouth daily.   ondansetron 4 MG tablet Commonly known as:  ZOFRAN Take 2 tablets (8 mg total) by mouth 2 (two) times daily.   Pancrelipase (Lip-Prot-Amyl) 24000 units Cpep Take 1 capsule (24,000 Units total) by mouth 3 (three) times daily before meals.   promethazine 12.5 MG tablet Commonly known as:  PHENERGAN Take 12.5 mg by mouth every 6 (six) hours as needed.   rivaroxaban 20 MG Tabs tablet Commonly known as:  XARELTO Take 1 tablet (20 mg total) by mouth daily with supper.   traMADol 50 MG tablet Commonly known as:  ULTRAM Take 1 tablet (50 mg total) by mouth 3 (three) times daily as needed.       Allergies:  Allergies  Allergen Reactions  . Tape Itching    Past Medical History, Surgical history, Social history, and Family History were reviewed and updated.  Review of Systems: All other 10 point review of systems is negative.   Physical Exam:  vitals were not taken for this visit.  Wt Readings from Last 3  Encounters:  08/27/15 124 lb (56.2 kg)  08/19/15 123 lb 11.2 oz (56.1 kg)  06/26/15 114 lb (51.7 kg)    Ocular: Sclerae unicteric, pupils equal, round and reactive to light Ear-nose-throat: Oropharynx clear, dentition fair Lymphatic: No cervical supraclavicular or axillary adenopathy Lungs no rales or rhonchi, good excursion bilaterally Heart regular rate and rhythm, no murmur appreciated Abd soft, nontender, positive bowel sounds, no liver or spleen tip palpated on exam.  MSK no focal spinal tenderness, no joint edema Neuro: non-focal, well-oriented, appropriate affect Breasts: Deferred  Lab  Results  Component Value Date   WBC 2.7 (L) 08/27/2015   HGB 11.7 08/27/2015   HCT 36.0 08/27/2015   MCV 91 08/27/2015   PLT 230 08/27/2015   Lab Results  Component Value Date   FERRITIN 226 08/27/2015   IRON 119 08/27/2015   TIBC 225 (L) 08/27/2015   UIBC 106 (L) 08/27/2015   IRONPCTSAT 53 08/27/2015   Lab Results  Component Value Date   RBC 3.98 08/27/2015   No results found for: Nils Pyle [facility] [address] Lab Results  Component Value Date   IGGSERUM 1,273 05/21/2015   IGMSERUM 40 05/21/2015   No results found for: Odetta Pink, SPEI   Chemistry      Component Value Date/Time   NA 137 08/27/2015 0913   NA 138 06/26/2015 0827   K 3.7 08/27/2015 0913   K 3.9 06/26/2015 0827   CL 103 08/27/2015 0913   CO2 27 08/27/2015 0913   CO2 21 (L) 06/26/2015 0827   BUN 10 08/27/2015 0913   BUN 13.8 08/18/2015 1213   CREATININE 1.1 08/27/2015 0913   CREATININE 1.1 08/18/2015 1213      Component Value Date/Time   CALCIUM 9.1 08/27/2015 0913   CALCIUM 8.8 06/26/2015 0827   ALKPHOS 77 08/27/2015 0913   ALKPHOS 74 06/26/2015 0827   AST 22 08/27/2015 0913   AST 14 06/26/2015 0827   ALT 20 08/27/2015 0913   ALT 15 06/26/2015 0827   BILITOT 1.10 08/27/2015 0913   BILITOT 0.63 06/26/2015 0827     Impression and Plan: [name] is a very nice 50 year old African-American female with past history of stage II gastric cancer. She underwent a partial gastrectomy followed by adjuvant chemotherapy and radiation therapy. So far, there has been no evidence of recurrent disease.  She continues to have the same discomfort around her old G-tube site. We have contacted Medicaid regarding her Lidoderm patches and she was able to pick them up on her way home today. We also refilled her Ultram. We will see if this helps with her discomfort and if she has any more episodes of n/v.  We will plan to see her back in 2 months for  follow-up. She will contact us with any questions or concerns. We can certainly see her sooner if need be.    Eliezer Bottom, NP 8/3/201710:17 AM

## 2015-09-25 ENCOUNTER — Other Ambulatory Visit: Payer: Self-pay

## 2015-09-25 ENCOUNTER — Other Ambulatory Visit: Payer: Self-pay | Admitting: Family

## 2015-09-25 DIAGNOSIS — R634 Abnormal weight loss: Secondary | ICD-10-CM

## 2015-09-25 DIAGNOSIS — D508 Other iron deficiency anemias: Secondary | ICD-10-CM

## 2015-09-25 DIAGNOSIS — M792 Neuralgia and neuritis, unspecified: Secondary | ICD-10-CM

## 2015-09-25 DIAGNOSIS — K909 Intestinal malabsorption, unspecified: Secondary | ICD-10-CM

## 2015-09-25 DIAGNOSIS — C169 Malignant neoplasm of stomach, unspecified: Secondary | ICD-10-CM

## 2015-09-25 DIAGNOSIS — K8681 Exocrine pancreatic insufficiency: Secondary | ICD-10-CM

## 2015-09-25 MED ORDER — LIDOCAINE 5 % EX PTCH
1.0000 | MEDICATED_PATCH | CUTANEOUS | 3 refills | Status: DC
Start: 2015-09-25 — End: 2016-03-14

## 2015-10-29 ENCOUNTER — Telehealth: Payer: Self-pay | Admitting: *Deleted

## 2015-10-29 ENCOUNTER — Ambulatory Visit: Payer: Medicaid Other | Admitting: Obstetrics and Gynecology

## 2015-10-29 NOTE — Telephone Encounter (Signed)
"  I have a bill for $152.00 from Dr.Squires for radiation and need help.  Are there any grants or donars for cancer patients."  Call transferred to [facility] [employer] advocate ext [phone].

## 2015-10-29 NOTE — Telephone Encounter (Signed)
Pt left message stating that she wants to get her Depo injection sooner because she is having hot flashes and bleeding. Per chart review, pt scheduled appt today @ 1400 for the injection.

## 2015-10-30 ENCOUNTER — Encounter: Payer: Self-pay | Admitting: Obstetrics and Gynecology

## 2015-10-30 NOTE — Progress Notes (Signed)
Patient did not keep 10/29/2015 GYN appointment  Durene Romans MD Attending Center for [employer])

## 2015-11-04 ENCOUNTER — Telehealth: Payer: Self-pay | Admitting: *Deleted

## 2015-11-04 NOTE — Telephone Encounter (Signed)
Pt left message yesterday stating that she did not hear back from [place] last week regarding her request to receive Depo Provera injection sooner. I returned pt's call today and discussed her concern. I stated that per chart review, she was given an appt on the same Halden Phegley as her call to see the doctor and discuss her problem, however she did not keep the appt.  Pt stated that she was told by the receptionist that she may not be able to receive the injection that Harlyn Italiano and so she did not come in. I apologized for any confusion which may have occurred and asked if she would like to reschedule the appt with a doctor. Paula [name] said she just wanted the injection for now and then would decide later if she needed an appt w/MD. Per chart review, pt''s last Depo Provera injection was 5/2 and she may receive an injection at anytime. Pt requested to come in tomorrow @ 1100 and I agreed.

## 2015-11-05 ENCOUNTER — Ambulatory Visit (INDEPENDENT_AMBULATORY_CARE_PROVIDER_SITE_OTHER): Payer: Medicaid Other

## 2015-11-05 VITALS — BP 106/72 | HR 69

## 2015-11-05 DIAGNOSIS — Z3042 Encounter for surveillance of injectable contraceptive: Secondary | ICD-10-CM | POA: Diagnosis not present

## 2015-11-05 MED ORDER — MEDROXYPROGESTERONE ACETATE 150 MG/ML IM SUSP
150.0000 mg | Freq: Once | INTRAMUSCULAR | Status: AC
  Administered 2015-11-05: 150 mg via INTRAMUSCULAR

## 2015-11-05 NOTE — Addendum Note (Signed)
Addended by: Phillip Heal, Elyanna Wallick A on: 11/05/2015 12:00 PM   Modules accepted: Orders

## 2015-11-05 NOTE — Progress Notes (Signed)
Patient presented today for depo-provera given in [place] deltoid.This injection is earlier than the depo schedule/ Per Dr.Constant ok to give. I have advised patient to schedule appointment to see a provider to discuss her bleeding and hot flashes. Patient voice understanding and  will make an appointment. Patient should returned in 3 months.

## 2015-11-09 ENCOUNTER — Other Ambulatory Visit: Payer: Self-pay | Admitting: Nurse Practitioner

## 2015-11-09 DIAGNOSIS — K8681 Exocrine pancreatic insufficiency: Secondary | ICD-10-CM

## 2015-11-09 DIAGNOSIS — R634 Abnormal weight loss: Secondary | ICD-10-CM

## 2015-11-09 DIAGNOSIS — K909 Intestinal malabsorption, unspecified: Secondary | ICD-10-CM

## 2015-11-09 DIAGNOSIS — D508 Other iron deficiency anemias: Secondary | ICD-10-CM

## 2015-11-09 DIAGNOSIS — C169 Malignant neoplasm of stomach, unspecified: Secondary | ICD-10-CM

## 2015-11-09 DIAGNOSIS — M792 Neuralgia and neuritis, unspecified: Secondary | ICD-10-CM

## 2015-11-09 MED ORDER — TRAMADOL HCL 50 MG PO TABS: 50.0000 mg | ORAL_TABLET | Freq: Three times a day (TID) | ORAL | 0 refills | Status: DC | PRN

## 2015-11-11 ENCOUNTER — Other Ambulatory Visit: Payer: Medicaid Other

## 2015-11-18 ENCOUNTER — Ambulatory Visit: Payer: Medicaid Other | Admitting: Internal Medicine

## 2015-11-27 ENCOUNTER — Ambulatory Visit (HOSPITAL_BASED_OUTPATIENT_CLINIC_OR_DEPARTMENT_OTHER): Payer: Medicaid Other | Admitting: Hematology & Oncology

## 2015-11-27 ENCOUNTER — Other Ambulatory Visit (HOSPITAL_BASED_OUTPATIENT_CLINIC_OR_DEPARTMENT_OTHER): Payer: Medicaid Other

## 2015-11-27 ENCOUNTER — Telehealth: Payer: Self-pay | Admitting: *Deleted

## 2015-11-27 ENCOUNTER — Ambulatory Visit: Payer: Self-pay

## 2015-11-27 VITALS — BP 120/76 | HR 81 | Temp 98.1°F | Resp 16 | Wt 134.8 lb

## 2015-11-27 DIAGNOSIS — K8681 Exocrine pancreatic insufficiency: Secondary | ICD-10-CM

## 2015-11-27 DIAGNOSIS — R109 Unspecified abdominal pain: Secondary | ICD-10-CM

## 2015-11-27 DIAGNOSIS — Z85028 Personal history of other malignant neoplasm of stomach: Secondary | ICD-10-CM

## 2015-11-27 DIAGNOSIS — M792 Neuralgia and neuritis, unspecified: Secondary | ICD-10-CM

## 2015-11-27 DIAGNOSIS — C169 Malignant neoplasm of stomach, unspecified: Secondary | ICD-10-CM

## 2015-11-27 DIAGNOSIS — K909 Intestinal malabsorption, unspecified: Secondary | ICD-10-CM

## 2015-11-27 DIAGNOSIS — D508 Other iron deficiency anemias: Secondary | ICD-10-CM

## 2015-11-27 DIAGNOSIS — R634 Abnormal weight loss: Secondary | ICD-10-CM

## 2015-11-27 DIAGNOSIS — R739 Hyperglycemia, unspecified: Secondary | ICD-10-CM

## 2015-11-27 LAB — CBC WITH DIFFERENTIAL (CANCER CENTER ONLY)
BASO#: 0 10*3/uL (ref 0.0–0.2)
BASO%: 0.5 % (ref 0.0–2.0)
EOS%: 3.1 % (ref 0.0–7.0)
Eosinophils Absolute: 0.1 10*3/uL (ref 0.0–0.5)
HEMATOCRIT: 35.1 % (ref 34.8–46.6)
HEMOGLOBIN: 11.5 g/dL — AB (ref 11.6–15.9)
LYMPH#: 1.1 10*3/uL (ref 0.9–3.3)
LYMPH%: 28.1 % (ref 14.0–48.0)
MCH: 28.8 pg (ref 26.0–34.0)
MCHC: 32.8 g/dL (ref 32.0–36.0)
MCV: 88 fL (ref 81–101)
MONO#: 0.4 10*3/uL (ref 0.1–0.9)
MONO%: 10.5 % (ref 0.0–13.0)
NEUT%: 57.8 % (ref 39.6–80.0)
NEUTROS ABS: 2.2 10*3/uL (ref 1.5–6.5)
Platelets: 239 10*3/uL (ref 145–400)
RBC: 3.99 10*6/uL (ref 3.70–5.32)
RDW: 13.1 % (ref 11.1–15.7)
WBC: 3.8 10*3/uL — AB (ref 3.9–10.0)

## 2015-11-27 LAB — CMP (CANCER CENTER ONLY)
ALT(SGPT): 20 U/L (ref 10–47)
AST: 21 U/L (ref 11–38)
Albumin: 3.5 g/dL (ref 3.3–5.5)
Alkaline Phosphatase: 81 U/L (ref 26–84)
BUN: 10 mg/dL (ref 7–22)
CHLORIDE: 103 meq/L (ref 98–108)
CO2: 25 meq/L (ref 18–33)
Calcium: 8.8 mg/dL (ref 8.0–10.3)
Creat: 1 mg/dl (ref 0.6–1.2)
GLUCOSE: 188 mg/dL — AB (ref 73–118)
POTASSIUM: 3.8 meq/L (ref 3.3–4.7)
Sodium: 135 mEq/L (ref 128–145)
TOTAL PROTEIN: 6.5 g/dL (ref 6.4–8.1)
Total Bilirubin: 0.8 mg/dl (ref 0.20–1.60)

## 2015-11-27 LAB — IRON AND TIBC
%SAT: 42 % (ref 21–57)
IRON: 85 ug/dL (ref 41–142)
TIBC: 203 ug/dL — AB (ref 236–444)
UIBC: 117 ug/dL — AB (ref 120–384)

## 2015-11-27 LAB — FERRITIN: FERRITIN: 186 ng/mL (ref 9–269)

## 2015-11-27 MED ORDER — HYDROCODONE-ACETAMINOPHEN 5-325 MG PO TABS: 1.0000 | ORAL_TABLET | Freq: Four times a day (QID) | ORAL | 0 refills | Status: DC | PRN

## 2015-11-27 MED ORDER — INFLUENZA VAC SPLIT QUAD 0.5 ML IM SUSY
0.5000 mL | PREFILLED_SYRINGE | Freq: Once | INTRAMUSCULAR | Status: DC
  Filled 2015-11-27: qty 0.5

## 2015-11-27 NOTE — Telephone Encounter (Addendum)
Patient aware of results  ----- Message from Eliezer Bottom, NP sent at 11/27/2015 11:11 AM EDT ----- Regarding: Iron  Iron studies look good. No infusion needed at this time. Thank you!  Sarah  ----- Message ----- From: Interface, Lab In Three Zero One Sent: 11/27/2015   9:11 AM To: Eliezer Bottom, NP

## 2015-11-27 NOTE — Progress Notes (Signed)
[facility] [phone]   [phone]) [phone]  OFFICE PROGRESS NOTE  Pcp Not In System No address on file  DIAGNOSIS: Stage IIA (T1a, N2, M0) gastric adenocarcinoma diagnosed in July of 2015  PRIOR THERAPY: 1) Status post diagnostic laparoscopy, distal gastrectomy with billroth II anastamosis and feeding gastrojejunostomy tube under the care of Dr. Barry Dienes on 09/20/2013. 2) Adjuvant systemic chemotherapy with 5-FU 600 mg/M2 with leucovorin on days 1, 8, 15, 22, 29 and 36 every 8 weeks. First dose 11/19/2013. 3) Adjuvant concurrent chemoradiation with Xeloda 650 MG/M2 by mouth twice a day for 5 days every week with radiation. The patient completed the course of radiotherapy but she declined to take Xeloda during her radiation.  CURRENT THERAPY: None.  INTERVAL HISTORY: Paula [name] 50 y.o. female returns to the clinic today for followup . She is still having abdominal pain. She is on a Lidoderm patch now.  She has had no vomiting. She's had no change in bowel or bladder habits. She's had no dysuria.  Her weight has been holding pretty steady.  She's had no cough. She's had no rashes. She's had no leg swelling. She is due for a CT scan next week. This will be helpful. So far, scans and never shown any recurrent malignancy. She is at significant risk for recurrence because of her initial stage of disease with lymphadenopathy area of the old man  Overall, her performance status is ECOG 1.  MEDICAL HISTORY: Past Medical History:  Diagnosis Date  . Costochondritis   . Esophageal reflux    On omeprazole  . Family history of cancer   . Headache(784.0) 2011   Migraines  . Heart murmur   . Hypertension    started around 2011  . Iron deficiency anemia due to chronic blood loss 05/25/2015  . 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 On antineoplastic chemotherapy    5-FU and leucovorin  . Ovarian cyst   . Pneumonia   . Pulmonary emboli ([place])   .  Stomach cancer ([facility])     ALLERGIES:  is allergic to tape.  MEDICATIONS:  Current Outpatient Prescriptions  Medication Sig Dispense Refill  . carvedilol (COREG) 6.25 MG tablet Take 6.25 mg by mouth 2 (two) times daily with a meal.    . HYDROcodone-acetaminophen (NORCO/VICODIN) 5-325 MG tablet Take 1 tablet by mouth every 6 (six) hours as needed for moderate pain. 60 tablet 0  . lidocaine (LIDODERM) 5 % Place 1 patch onto the skin daily. Remove & Discard patch within 12 hours or as directed by MD 30 patch 3  . Multiple Vitamins-Minerals (MULTIVITAMIN & MINERAL PO) Take 1 tablet by mouth daily.    Marland Kitchen omeprazole (PRILOSEC) 20 MG capsule Take 20 mg by mouth daily.  5  . ondansetron (ZOFRAN) 4 MG tablet Take 2 tablets (8 mg total) by mouth 2 (two) times daily. 60 tablet 3  . promethazine (PHENERGAN) 12.5 MG tablet Take 12.5 mg by mouth every 6 (six) hours as needed.  2  . rivaroxaban (XARELTO) 20 MG TABS tablet Take 1 tablet (20 mg total) by mouth daily with supper. 30 tablet 6  . traMADol (ULTRAM) 50 MG tablet Take 1 tablet (50 mg total) by mouth 3 (three) times daily as needed. 90 tablet 0   Current Facility-Administered Medications  Medication Dose Route Frequency Provider Last Rate Last Dose  . Influenza vac split quadrivalent PF (FLUARIX) injection 0.5 mL  0.5 mL Intramuscular Once Paula Napoleon,  MD      . medroxyPROGESTERone (DEPO-PROVERA) injection 150 mg  150 mg Intramuscular Q90 days Paula Oman, MD   150 mg at 06/23/15 0951    SURGICAL HISTORY:  Past Surgical History:  Procedure Laterality Date  . ESOPHAGOGASTRODUODENOSCOPY N/A 09/16/2013   Procedure: ESOPHAGOGASTRODUODENOSCOPY (EGD);  Surgeon: Paula Horner, MD;  Location: [facility];  Service: Endoscopy;  Laterality: N/A;  . LAPAROSCOPY N/A 09/20/2013   Procedure: DIAGNOSTIC LAPAROSCOPY,DISTAL GASTRECTOMY AND FEEDING GASTROJEJUNOSTOMY;  Surgeon: Paula Klein, MD;  Location: [facility];  Service: General;  Laterality: N/A;  .  OOPHORECTOMY     Around 1985 left ovary removal  . PORTACATH PLACEMENT Left 11/18/2013   Procedure: INSERTION PORT-A-CATH;  Surgeon: Paula Klein, MD;  Location: [facility];  Service: General;  Laterality: Left;  . TONSILLECTOMY     Around 1994  . TUBAL LIGATION      REVIEW OF SYSTEMS:     PHYSICAL EXAMINATION: Well-developed and well-nourished African American female in no obvious distress. Vital signs temperature of 98.6. Pulse 60. Blood pressure 123/72. Weight is 114 pounds. Head and neck exam shows no ocular or oral lesions. There is no adenopathy in her neck. There is no scleral icterus. Thyroid is nonpalpable. Lungs are clear. Cardiac exam regular rate and rhythm with no murmurs, rubs or bruits. Abdomen is soft. She has good bowel sounds. There is no fluid wave. There is no palpable liver or spleen tip. She has well-healed laparotomy scars. Extremity shows no clubbing, cyanosis or edema. Back exam shows no tenderness over the spine, ribs or hips. Extremities shows no clubbing, cyanosis or edema. Skin exam shows no rashes, ecchymoses or petechia.  ECOG PERFORMANCE STATUS: 1 - Symptomatic but completely ambulatory  Blood pressure 120/76, pulse 81, temperature 98.1 F (36.7 C), temperature source Oral, resp. rate 16, weight 134 lb 12.8 oz (61.1 kg).  LABORATORY DATA: Lab Results  Component Value Date   WBC 3.8 (L) 11/27/2015   HGB 11.5 (L) 11/27/2015   HCT 35.1 11/27/2015   MCV 88 11/27/2015   PLT 239 11/27/2015      Chemistry      Component Value Date/Time   NA 135 11/27/2015 0853   NA 140 09/24/2015 0953   K 3.8 11/27/2015 0853   K 4.0 09/24/2015 0953   CL 103 11/27/2015 0853   CO2 25 11/27/2015 0853   CO2 23 09/24/2015 0953   BUN 10 11/27/2015 0853   BUN 8.6 09/24/2015 0953   CREATININE 1.0 11/27/2015 0853   CREATININE 1.0 09/24/2015 0953      Component Value Date/Time   CALCIUM 8.8 11/27/2015 0853   CALCIUM 9.6 09/24/2015 0953   ALKPHOS 81 11/27/2015 0853   ALKPHOS  99 09/24/2015 0953   AST 21 11/27/2015 0853   AST 17 09/24/2015 0953   ALT 20 11/27/2015 0853   ALT 11 09/24/2015 0953   BILITOT 0.80 11/27/2015 0853   BILITOT 0.83 09/24/2015 0953       RADIOGRAPHIC STUDIES:  ASSESSMENT AND PLAN:    [name] is a very nice 50 year old African-American female with past history of stage II gastric cancer. She underwent a partial gastrectomy followed by adjuvant chemotherapy and radiation therapy. This office in April 2016. So far, his been no evidence of recurrent disease.  I would had a think that her symptoms are somewhat related to her surgery and adjuvant therapy. She just has "different plumbing" .  I think that his abdominal pain will be chronic area and she  has a lot of scar tissue. She has had surgery and radiation. It is no surprise that she would have abdominal discomfort.  I will try her on some Vicodin. We'll see if this can help a little bit. It would be nice if her quality of life was a little bit better.  Again, her blood sugar was quite high today. Baxter Flattery is why it would be high. She may be diabetic now.  I will see her back in one month. We will recheck her blood sugars. I'll check a hemoglobin A1c on her. If the blood sugar still elevated, they will have to get her back to her family doctor for management.   I spent about 35 minutes with her today.  Lattie Haw

## 2015-11-28 LAB — RETICULOCYTES: RETICULOCYTE COUNT: 1.2 % (ref 0.6–2.6)

## 2015-11-30 ENCOUNTER — Encounter (HOSPITAL_COMMUNITY): Payer: Self-pay

## 2015-11-30 ENCOUNTER — Ambulatory Visit (HOSPITAL_COMMUNITY)
Admission: RE | Admit: 2015-11-30 | Discharge: 2015-11-30 | Disposition: A | Discharge status: Home / Self Care | Payer: Medicaid Other | Source: Ambulatory Visit | Attending: Internal Medicine | Admitting: Internal Medicine

## 2015-11-30 DIAGNOSIS — C163 Malignant neoplasm of pyloric antrum: Secondary | ICD-10-CM

## 2015-11-30 DIAGNOSIS — R112 Nausea with vomiting, unspecified: Secondary | ICD-10-CM | POA: Insufficient documentation

## 2015-11-30 DIAGNOSIS — C169 Malignant neoplasm of stomach, unspecified: Secondary | ICD-10-CM | POA: Insufficient documentation

## 2015-11-30 MED ORDER — IOPAMIDOL (ISOVUE-300) INJECTION 61%
100.0000 mL | Freq: Once | INTRAVENOUS | Status: AC | PRN
  Administered 2015-11-30: 100 mL via INTRAVENOUS

## 2015-12-03 ENCOUNTER — Ambulatory Visit (HOSPITAL_COMMUNITY): Payer: Medicaid Other

## 2015-12-03 ENCOUNTER — Telehealth: Payer: Self-pay | Admitting: Medical Oncology

## 2015-12-03 NOTE — Telephone Encounter (Signed)
-----   Message from Curt Bears, MD sent at 12/02/2015  7:25 PM EDT ----- Regarding: RE: wants ct results-now sees ennever Scan is OK. I do not have to see her if she is seeing Ennever. ----- Message ----- From: [name], RN Sent: 12/02/2015   4:41 PM To: Curt Bears, MD Subject: wants ct results-now sees ennever              asking if scan okay? Her f/u is with Ennever in nov-  Are you still seeing her?

## 2015-12-03 NOTE — Telephone Encounter (Signed)
Pt.notified

## 2015-12-04 ENCOUNTER — Ambulatory Visit (INDEPENDENT_AMBULATORY_CARE_PROVIDER_SITE_OTHER): Payer: Medicaid Other | Admitting: Obstetrics & Gynecology

## 2015-12-04 ENCOUNTER — Encounter: Payer: Self-pay | Admitting: Obstetrics & Gynecology

## 2015-12-04 VITALS — BP 121/78 | HR 81 | Wt 133.0 lb

## 2015-12-04 DIAGNOSIS — N924 Excessive bleeding in the premenopausal period: Secondary | ICD-10-CM

## 2015-12-04 DIAGNOSIS — Z3042 Encounter for surveillance of injectable contraceptive: Secondary | ICD-10-CM | POA: Diagnosis not present

## 2015-12-04 DIAGNOSIS — N951 Menopausal and female climacteric states: Secondary | ICD-10-CM

## 2015-12-04 MED ORDER — MEDROXYPROGESTERONE ACETATE 150 MG/ML IM SUSP
150.0000 mg | INTRAMUSCULAR | Status: DC
  Administered 2015-12-04 – 2017-10-11 (×12): 150 mg via INTRAMUSCULAR

## 2015-12-04 MED ORDER — GABAPENTIN 600 MG PO TABS: 600.0000 mg | ORAL_TABLET | Freq: Every day | ORAL | 3 refills | Status: DC

## 2015-12-04 NOTE — Patient Instructions (Signed)
Return to clinic for any scheduled appointments or for any gynecologic concerns as needed.   

## 2015-12-04 NOTE — Progress Notes (Signed)
GYNECOLOGY VISIT NOTE  History:  50 y.o. female with history of gastric cancer here for report of concerning perimenopausal vasomotor symptoms.  Was treated with Neurontin in past, wants to restart this.  Also has AUB being treated with Depo Provera, got last dose on 09/25/2014. Feels her bleeding resumed about two months afterwards, asks for another shot today.  Feels it also helps with her symptoms. Normal pelvic anatomy on recent imaging.   Denies abnormal vaginal discharge, pelvic pain, problems with intercourse or other gynecologic concerns.    Past Medical History:  Diagnosis Date  . Costochondritis   . Esophageal reflux    On omeprazole  . Family history of cancer   . Headache(784.0) 2011   Migraines  . Heart murmur   . Hypertension    started around 2011  . Iron deficiency anemia due to chronic blood loss 05/25/2015  . 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 On antineoplastic chemotherapy    5-FU and leucovorin  . Ovarian cyst   . Pneumonia   . Pulmonary emboli ([place])   . Stomach cancer ([place]) dx'd 08/2013    Past Surgical History:  Procedure Laterality Date  . ESOPHAGOGASTRODUODENOSCOPY N/A 09/16/2013   Procedure: ESOPHAGOGASTRODUODENOSCOPY (EGD);  Surgeon: Wonda Horner, MD;  Location: [facility];  Service: Endoscopy;  Laterality: N/A;  . LAPAROSCOPY N/A 09/20/2013   Procedure: DIAGNOSTIC LAPAROSCOPY,DISTAL GASTRECTOMY AND FEEDING GASTROJEJUNOSTOMY;  Surgeon: [name], MD;  Location: [facility];  Service: General;  Laterality: N/A;  . OOPHORECTOMY     Around 1985 left ovary removal  . PORTACATH PLACEMENT Left 11/18/2013   Procedure: INSERTION PORT-A-CATH;  Surgeon: [name], MD;  Location: [facility];  Service: General;  Laterality: Left;  . TONSILLECTOMY     Around 1994  . TUBAL LIGATION      The following portions of the patient's history were reviewed and updated as appropriate: allergies, current medications, past family history, past medical history, past social  history, past surgical history and problem list.   Health Maintenance:  Normal pap and negative HRHPV on 02/11/15.  Normal mammogram on 03/20/2015.   Review of Systems:  Pertinent items noted in HPI and remainder of comprehensive ROS otherwise negative.  Objective:  Physical Exam BP 121/78   Pulse 81   Wt 133 lb (60.3 kg)   LMP 11/30/2015   BMI 25.97 kg/m  CONSTITUTIONAL: Well-developed, well-nourished female in no acute distress.  HENT:  Normocephalic, atraumatic. External right and left ear normal. Oropharynx is clear and moist EYES: Conjunctivae and EOM are normal. Pupils are equal, round, and reactive to light. No scleral icterus.  NECK: Normal range of motion, supple, no masses SKIN: Skin is warm and dry. No rash noted. Not diaphoretic. No erythema. No pallor. NEUROLOGIC: Alert and oriented to person, place, and time. Normal reflexes, muscle tone coordination. No cranial nerve deficit noted. PSYCHIATRIC: Normal mood and affect. Normal behavior. Normal judgment and thought content. CARDIOVASCULAR: Normal heart rate noted RESPIRATORY: Effort and breath sounds normal, no problems with respiration noted ABDOMEN: Soft, no distention noted.   PELVIC: Deferred MUSCULOSKELETAL: Normal range of motion. No edema noted.  Labs and Imaging Ct Abdomen Pelvis W Contrast  Result Date: 11/30/2015 CLINICAL DATA:  Stage IIA gastric adenocarcinoma, status post distal gastrectomy with Billroth II. EXAM: CT ABDOMEN AND PELVIS WITH CONTRAST TECHNIQUE: Multidetector CT imaging of the abdomen and pelvis was performed using the standard protocol following bolus administration of intravenous contrast. CONTRAST:  173mL ISOVUE-300 IOPAMIDOL (ISOVUE-300) INJECTION 61% COMPARISON:  08/18/2015. FINDINGS: Lower chest:  Stable right lower lobe scarring. Hepatobiliary: No focal abnormality within the liver parenchyma. There is no evidence for gallstones, gallbladder wall thickening, or pericholecystic fluid. No  intrahepatic or extrahepatic biliary dilation. Pancreas: Diffusely atrophic. Spleen: No splenomegaly. No focal mass lesion. Adrenals/Urinary Tract: No adrenal nodule or mass. Kidneys are unremarkable. No evidence for hydroureter. The urinary bladder appears normal for the degree of distention. Stomach/Bowel: Postsurgical change in the stomach compatible with distal gastrectomy. No dilatation of small bowel. The terminal ileum is normal. The appendix is normal. No gross colonic mass. No colonic wall thickening. No substantial diverticular change. Vascular/Lymphatic: No abdominal aortic aneurysm. No abdominal aortic atherosclerotic calcification. There is no gastrohepatic or hepatoduodenal ligament lymphadenopathy. No intraperitoneal or retroperitoneal lymphadenopathy. No pelvic sidewall lymphadenopathy. Reproductive: Fibroid change noted in the uterus. There is no adnexal mass. Other:  No intraperitoneal free fluid. Musculoskeletal: Bone windows reveal no worrisome lytic or sclerotic osseous lesions. IMPRESSION: 1. Stable exam. No evidence for locally recurrent or metastatic disease in the abdomen or pelvis. Electronically Signed   By: Misty Stanley M.D.   On: 11/30/2015 09:28    Assessment & Plan:  1. Perimenopausal vasomotor symptoms Will do trial of Neurontin again, if this does not work, consider Effexor or Paxil. - medroxyPROGESTERone (DEPO-PROVERA) injection 150 mg; Inject 1 mL (150 mg total) into the muscle every 8 (eight) weeks. - gabapentin (NEURONTIN) 600 MG tablet; Take 1 tablet (600 mg total) by mouth at bedtime.  Dispense: 30 tablet; Refill: 3  2. Abnormal perimenopausal bleeding Needs more frequent dosing, changed interval to 8 weeks. Will monitor response.  If continues, may need endometrial biopsy and pelvic ultrasound for further evaluation. - medroxyPROGESTERone (DEPO-PROVERA) injection 150 mg; Inject 1 mL (150 mg total) into the muscle every 8 (eight) weeks.   Routine preventative  health maintenance measures emphasized. Please refer to After Visit Summary for other counseling recommendations.   Return in about 1 month (around 01/04/2016) for Follow up perimenopausal symptoms.   Total face-to-face time with patient: 15 minutes. Over 50% of encounter was spent on counseling and coordination of care.   Verita Schneiders, MD, [place] Attending [place], [facility] for [employer], [place]

## 2015-12-09 ENCOUNTER — Telehealth: Payer: Self-pay | Admitting: *Deleted
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
## 2015-12-17 ENCOUNTER — Other Ambulatory Visit: Payer: Self-pay

## 2015-12-17 DIAGNOSIS — R112 Nausea with vomiting, unspecified: Secondary | ICD-10-CM

## 2015-12-17 DIAGNOSIS — C163 Malignant neoplasm of pyloric antrum: Secondary | ICD-10-CM

## 2015-12-17 DIAGNOSIS — C169 Malignant neoplasm of stomach, unspecified: Secondary | ICD-10-CM

## 2015-12-17 MED ORDER — PROMETHAZINE HCL 12.5 MG PO TABS: 12.5000 mg | ORAL_TABLET | Freq: Four times a day (QID) | ORAL | 3 refills | Status: DC | PRN

## 2015-12-18 ENCOUNTER — Telehealth: Payer: Self-pay | Admitting: General Practice

## 2015-12-18 NOTE — Telephone Encounter (Signed)
Patient called and left message stating she is having trouble getting her gabapentin Rx as it needs to be authorized. Called pharmacy & they were able to process the medication

## 2016-01-01 ENCOUNTER — Ambulatory Visit (HOSPITAL_BASED_OUTPATIENT_CLINIC_OR_DEPARTMENT_OTHER): Payer: Medicaid Other

## 2016-01-01 ENCOUNTER — Ambulatory Visit (HOSPITAL_BASED_OUTPATIENT_CLINIC_OR_DEPARTMENT_OTHER): Payer: Medicaid Other | Admitting: Hematology & Oncology

## 2016-01-01 ENCOUNTER — Encounter: Payer: Self-pay | Admitting: Hematology & Oncology

## 2016-01-01 ENCOUNTER — Other Ambulatory Visit (HOSPITAL_BASED_OUTPATIENT_CLINIC_OR_DEPARTMENT_OTHER): Payer: Medicaid Other

## 2016-01-01 ENCOUNTER — Other Ambulatory Visit: Payer: Self-pay | Admitting: *Deleted

## 2016-01-01 VITALS — BP 121/88 | HR 72 | Temp 97.8°F | Wt 134.0 lb

## 2016-01-01 DIAGNOSIS — Z23 Encounter for immunization: Secondary | ICD-10-CM

## 2016-01-01 DIAGNOSIS — C169 Malignant neoplasm of stomach, unspecified: Secondary | ICD-10-CM

## 2016-01-01 DIAGNOSIS — Z85038 Personal history of other malignant neoplasm of large intestine: Secondary | ICD-10-CM

## 2016-01-01 LAB — CBC WITH DIFFERENTIAL (CANCER CENTER ONLY)
BASO#: 0 10*3/uL (ref 0.0–0.2)
BASO%: 0.4 % (ref 0.0–2.0)
EOS%: 0.8 % (ref 0.0–7.0)
Eosinophils Absolute: 0 10*3/uL (ref 0.0–0.5)
HCT: 38.6 % (ref 34.8–46.6)
HGB: 12.6 g/dL (ref 11.6–15.9)
LYMPH#: 1.3 10*3/uL (ref 0.9–3.3)
LYMPH%: 26.2 % (ref 14.0–48.0)
MCH: 28.8 pg (ref 26.0–34.0)
MCHC: 32.6 g/dL (ref 32.0–36.0)
MCV: 88 fL (ref 81–101)
MONO#: 0.4 10*3/uL (ref 0.1–0.9)
MONO%: 8.3 % (ref 0.0–13.0)
NEUT#: 3.1 10*3/uL (ref 1.5–6.5)
NEUT%: 64.3 % (ref 39.6–80.0)
PLATELETS: 219 10*3/uL (ref 145–400)
RBC: 4.38 10*6/uL (ref 3.70–5.32)
RDW: 14.1 % (ref 11.1–15.7)
WBC: 4.8 10*3/uL (ref 3.9–10.0)

## 2016-01-01 LAB — CMP (CANCER CENTER ONLY)
ALK PHOS: 108 U/L — AB (ref 26–84)
ALT: 21 U/L (ref 10–47)
AST: 19 U/L (ref 11–38)
Albumin: 3.9 g/dL (ref 3.3–5.5)
BUN: 10 mg/dL (ref 7–22)
CO2: 30 mEq/L (ref 18–33)
CREATININE: 1.1 mg/dL (ref 0.6–1.2)
Calcium: 9.2 mg/dL (ref 8.0–10.3)
Chloride: 103 mEq/L (ref 98–108)
GLUCOSE: 88 mg/dL (ref 73–118)
POTASSIUM: 3.7 meq/L (ref 3.3–4.7)
SODIUM: 141 meq/L (ref 128–145)
Total Bilirubin: 0.9 mg/dl (ref 0.20–1.60)
Total Protein: 7.4 g/dL (ref 6.4–8.1)

## 2016-01-01 MED ORDER — HYDROCODONE-ACETAMINOPHEN 5-325 MG PO TABS: 1.0000 | ORAL_TABLET | Freq: Four times a day (QID) | ORAL | 0 refills | Status: DC | PRN

## 2016-01-01 MED ORDER — INFLUENZA VAC SPLIT QUAD 0.5 ML IM SUSY
0.5000 mL | PREFILLED_SYRINGE | Freq: Once | INTRAMUSCULAR | Status: AC
  Administered 2016-01-01: 0.5 mL via INTRAMUSCULAR
  Filled 2016-01-01: qty 0.5

## 2016-01-01 NOTE — Patient Instructions (Signed)

## 2016-01-01 NOTE — Progress Notes (Signed)
[place] [phone]   [phone]  OFFICE PROGRESS NOTE  Pcp Not In System No address on file  DIAGNOSIS: Stage IIA (T1a, N2, M0) gastric adenocarcinoma diagnosed in July of 2015  PRIOR THERAPY: 1) Status post diagnostic laparoscopy, distal gastrectomy with billroth II anastamosis and feeding gastrojejunostomy tube under the care of Dr. Barry Dienes on 09/20/2013. 2) Adjuvant systemic chemotherapy with 5-FU 600 mg/M2 with leucovorin on days 1, 8, 15, 22, 29 and 36 every 8 weeks. First dose 11/19/2013. 3) Adjuvant concurrent chemoradiation with Xeloda 650 MG/M2 by mouth twice a day for 5 days every week with radiation. The patient completed the course of radiotherapy but she declined to take Xeloda during her radiation.  CURRENT THERAPY: None.  INTERVAL HISTORY: Paula [name] 50 y.o. female returns to the clinic today for followup . She is still having abdominal pain. However, this is much better. The bike and seems to be helping her. She seems to be more functional.   She did have a CT scan done 3 is at week saw her. This did not show any abnormalities that were new in the abdomen or pelvis.  I'm sure that she just has a lot of scar tissue that is causing her problems.  She is on a Lidoderm patch which seems to be helping a little bit.  She did see a gastroenterologist in [place]. I think she has back to see her. She was given some medication to try to help.  Overall, her performance status is ECOG 1.  MEDICAL HISTORY: Past Medical History:  Diagnosis Date  . Costochondritis   . Esophageal reflux    On omeprazole  . Family history of cancer   . Headache(784.0) 2011   Migraines  . Heart murmur   . Hypertension    started around 2011  . Iron deficiency anemia due to chronic blood loss 05/25/2015  . 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 On antineoplastic chemotherapy    5-FU and leucovorin  . Ovarian cyst   . Pneumonia   .  Pulmonary emboli ([place])   . Stomach cancer ([facility]) dx'd 08/2013    ALLERGIES:  is allergic to tape.  MEDICATIONS:  Current Outpatient Prescriptions  Medication Sig Dispense Refill  . carvedilol (COREG) 6.25 MG tablet Take 6.25 mg by mouth 2 (two) times daily with a meal.    . gabapentin (NEURONTIN) 600 MG tablet Take 1 tablet (600 mg total) by mouth at bedtime. 30 tablet 3  . lidocaine (LIDODERM) 5 % Place 1 patch onto the skin daily. Remove & Discard patch within 12 hours or as directed by MD 30 patch 3  . methscopolamine (PAMINE FORTE) 5 MG tablet Take 5 mg by mouth every 6 (six) hours as needed.    . Multiple Vitamins-Minerals (MULTIVITAMIN & MINERAL PO) Take 1 tablet by mouth daily.    Marland Kitchen omeprazole (PRILOSEC) 20 MG capsule Take 20 mg by mouth daily.  5  . ondansetron (ZOFRAN) 4 MG tablet Take 2 tablets (8 mg total) by mouth 2 (two) times daily. 60 tablet 3  . predniSONE (STERAPRED UNI-PAK 21 TAB) 10 MG (21) TBPK tablet Take 1 tablet by mouth every morning.  0  . promethazine (PHENERGAN) 12.5 MG tablet Take 1 tablet (12.5 mg total) by mouth every 6 (six) hours as needed. 60 tablet 3  . rivaroxaban (XARELTO) 20 MG TABS tablet Take 1 tablet (20 mg total) by mouth daily with supper. 30 tablet 6  .  traMADol (ULTRAM) 50 MG tablet Take 1 tablet (50 mg total) by mouth 3 (three) times daily as needed. 90 tablet 0  . HYDROcodone-acetaminophen (NORCO/VICODIN) 5-325 MG tablet Take 1 tablet by mouth every 6 (six) hours as needed for moderate pain. 60 tablet 0   Current Facility-Administered Medications  Medication Dose Route Frequency Provider Last Rate Last Dose  . medroxyPROGESTERone (DEPO-PROVERA) injection 150 mg  150 mg Intramuscular Q8 Weeks Osborne Oman, MD   150 mg at 12/04/15 1157    SURGICAL HISTORY:  Past Surgical History:  Procedure Laterality Date  . ESOPHAGOGASTRODUODENOSCOPY N/A 09/16/2013   Procedure: ESOPHAGOGASTRODUODENOSCOPY (EGD);  Surgeon: Wonda Horner, MD;  Location: [facility];  Service: Endoscopy;  Laterality: N/A;  . LAPAROSCOPY N/A 09/20/2013   Procedure: DIAGNOSTIC LAPAROSCOPY,DISTAL GASTRECTOMY AND FEEDING GASTROJEJUNOSTOMY;  Surgeon: [name], MD;  Location: [facility];  Service: General;  Laterality: N/A;  . OOPHORECTOMY     Around 1985 left ovary removal  . PORTACATH PLACEMENT Left 11/18/2013   Procedure: INSERTION PORT-A-CATH;  Surgeon: [name], MD;  Location: [facility];  Service: General;  Laterality: Left;  . TONSILLECTOMY     Around 1994  . TUBAL LIGATION      REVIEW OF SYSTEMS:     PHYSICAL EXAMINATION: Well-developed and well-nourished African American female in no obvious distress. Vital signs temperature of 98.6. Pulse 60. Blood pressure 123/72. Weight is 114 pounds. Head and neck exam shows no ocular or oral lesions. There is no adenopathy in her neck. There is no scleral icterus. Thyroid is nonpalpable. Lungs are clear. Cardiac exam regular rate and rhythm with no murmurs, rubs or bruits. Abdomen is soft. She has good bowel sounds. There is no fluid wave. There is no palpable liver or spleen tip. She has well-healed laparotomy scars. Extremity shows no clubbing, cyanosis or edema. Back exam shows no tenderness over the spine, ribs or hips. Extremities shows no clubbing, cyanosis or edema. Skin exam shows no rashes, ecchymoses or petechia.  ECOG PERFORMANCE STATUS: 1 - Symptomatic but completely ambulatory  Blood pressure 121/88, pulse 72, temperature 97.8 F (36.6 C), temperature source Oral, weight 134 lb (60.8 kg), last menstrual period 11/30/2015.  LABORATORY DATA: Lab Results  Component Value Date   WBC 4.8 01/01/2016   HGB 12.6 01/01/2016   HCT 38.6 01/01/2016   MCV 88 01/01/2016   PLT 219 01/01/2016      Chemistry      Component Value Date/Time   NA 141 01/01/2016 1032   NA 140 09/24/2015 0953   K 3.7 01/01/2016 1032   K 4.0 09/24/2015 0953   CL 103 01/01/2016 1032   CO2 30 01/01/2016 1032   CO2 23 09/24/2015 0953     BUN 10 01/01/2016 1032   BUN 8.6 09/24/2015 0953   CREATININE 1.1 01/01/2016 1032   CREATININE 1.0 09/24/2015 0953      Component Value Date/Time   CALCIUM 9.2 01/01/2016 1032   CALCIUM 9.6 09/24/2015 0953   ALKPHOS 108 (H) 01/01/2016 1032   ALKPHOS 99 09/24/2015 0953   AST 19 01/01/2016 1032   AST 17 09/24/2015 0953   ALT 21 01/01/2016 1032   ALT 11 09/24/2015 0953   BILITOT 0.90 01/01/2016 1032   BILITOT 0.83 09/24/2015 0953       RADIOGRAPHIC STUDIES:  ASSESSMENT AND PLAN:    Paula [name] is a very nice 50 year old African-American female with past history of stage II gastric cancer. She underwent a partial gastrectomy followed by adjuvant  chemotherapy and radiation therapy. This office in April 2016. So far, his been no evidence of recurrent disease.  I would had a think that her symptoms are somewhat related to her surgery and adjuvant therapy. She just has "different plumbing" .  I think that his abdominal pain will be chronic area and she has a lot of scar tissue. She has had surgery and radiation. It is no surprise that she would have abdominal discomfort.  I am glad that the Vicodin is helping her. She seems to be a little more functional which is nice to see.  I think we get her back now in a couple months. I think this would be reasonable.   I spent about 25 minutes with her today.  Lattie Haw, MD

## 2016-01-02 LAB — HEMOGLOBIN A1C
ESTIMATED AVERAGE GLUCOSE: 108 mg/dL
Hemoglobin A1c: 5.4 % (ref 4.8–5.6)

## 2016-01-04 ENCOUNTER — Ambulatory Visit: Payer: Self-pay | Admitting: Obstetrics & Gynecology

## 2016-01-19 ENCOUNTER — Ambulatory Visit (INDEPENDENT_AMBULATORY_CARE_PROVIDER_SITE_OTHER): Payer: Medicaid Other | Admitting: *Deleted

## 2016-01-19 ENCOUNTER — Telehealth: Payer: Self-pay | Admitting: *Deleted

## 2016-01-19 VITALS — BP 129/81 | HR 89

## 2016-01-19 DIAGNOSIS — Z3042 Encounter for surveillance of injectable contraceptive: Secondary | ICD-10-CM

## 2016-01-19 DIAGNOSIS — N924 Excessive bleeding in the premenopausal period: Secondary | ICD-10-CM | POA: Diagnosis not present

## 2016-01-19 DIAGNOSIS — N951 Menopausal and female climacteric states: Secondary | ICD-10-CM | POA: Diagnosis not present

## 2016-01-19 NOTE — Telephone Encounter (Signed)
Patient c/o frequent vomiting since Thursday. She has no other symptoms. She has a GI physician for a history of multiple GI issues. Spoke with Dr Marin Olp and he would like patient to follow up with her GI physician regarding her symptoms. Patient understands directions.

## 2016-01-25 ENCOUNTER — Ambulatory Visit: Payer: Medicaid Other

## 2016-03-04 ENCOUNTER — Other Ambulatory Visit: Payer: Self-pay | Admitting: Nurse Practitioner

## 2016-03-14 ENCOUNTER — Encounter: Payer: Self-pay | Admitting: Obstetrics & Gynecology

## 2016-03-14 ENCOUNTER — Ambulatory Visit (INDEPENDENT_AMBULATORY_CARE_PROVIDER_SITE_OTHER): Payer: Medicare Other | Admitting: Obstetrics & Gynecology

## 2016-03-14 VITALS — BP 114/79 | HR 72 | Ht 61.0 in | Wt 141.9 lb

## 2016-03-14 DIAGNOSIS — N924 Excessive bleeding in the premenopausal period: Secondary | ICD-10-CM

## 2016-03-14 DIAGNOSIS — N951 Menopausal and female climacteric states: Secondary | ICD-10-CM | POA: Diagnosis not present

## 2016-03-14 DIAGNOSIS — N939 Abnormal uterine and vaginal bleeding, unspecified: Secondary | ICD-10-CM | POA: Diagnosis not present

## 2016-03-14 MED ORDER — GABAPENTIN 600 MG PO TABS: 600.0000 mg | ORAL_TABLET | Freq: Every day | ORAL | 3 refills | Status: DC

## 2016-03-14 NOTE — Progress Notes (Signed)
GYNECOLOGY OFFICE VISIT NOTE  History:  51 y.o. F. here today for AUB. On Depo Provera q 8 weeks, missed last dose in early 01/2016 so had more bleeding.  Also has vasomotor symptoms on Neurontin; 600 mg dose made her "feel weird" and she stopped after two doses. She denies any abnormal vaginal discharge or other concerns.   Past Medical History:  Diagnosis Date  . Costochondritis   . Esophageal reflux    On omeprazole  . Family history of cancer   . Headache(784.0) 2011   Migraines  . Heart murmur   . Hypertension    started around 2011  . Iron deficiency anemia due to chronic blood loss 05/25/2015  . 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 On antineoplastic chemotherapy    5-FU and leucovorin  . Ovarian cyst   . Pneumonia   . Pulmonary emboli ([place])   . Stomach cancer ([address]) dx'd 08/2013    Past Surgical History:  Procedure Laterality Date  . ESOPHAGOGASTRODUODENOSCOPY N/A 09/16/2013   Procedure: ESOPHAGOGASTRODUODENOSCOPY (EGD);  Surgeon: Wonda Horner, MD;  Location: [facility];  Service: Endoscopy;  Laterality: N/A;  . LAPAROSCOPY N/A 09/20/2013   Procedure: DIAGNOSTIC LAPAROSCOPY,DISTAL GASTRECTOMY AND FEEDING GASTROJEJUNOSTOMY;  Surgeon: [name], MD;  Location: [facility];  Service: General;  Laterality: N/A;  . OOPHORECTOMY     Around 1985 left ovary removal  . PORTACATH PLACEMENT Left 11/18/2013   Procedure: INSERTION PORT-A-CATH;  Surgeon: [name], MD;  Location: [facility];  Service: General;  Laterality: Left;  . TONSILLECTOMY     Around 1994  . TUBAL LIGATION      The following portions of the patient's history were reviewed and updated as appropriate: allergies, current medications, past family history, past medical history, past social history, past surgical history and problem list.   Health Maintenance:  Normal pap and negative HRHPV on 02/11/15.  Normal mammogram on 03/20/2015.   Review of Systems:  Pertinent items noted in HPI and remainder of comprehensive  ROS otherwise negative.   Objective:  Physical Exam BP 114/79   Pulse 72   Ht 5\' 1"  (1.549 m)   Wt 141 lb 14.4 oz (64.4 kg)   LMP 02/28/2016 (Exact Date)   BMI 26.81 kg/m  CONSTITUTIONAL: Well-developed, well-nourished female in no acute distress.  HENT:  Normocephalic, atraumatic. External right and left ear normal. Oropharynx is clear and moist EYES: Conjunctivae and EOM are normal. Pupils are equal, round, and reactive to light. No scleral icterus.  NECK: Normal range of motion, supple, no masses SKIN: Skin is warm and dry. No rash noted. Not diaphoretic. No erythema. No pallor. NEUROLOGIC: Alert and oriented to person, place, and time. Normal reflexes, muscle tone coordination. No cranial nerve deficit noted. PSYCHIATRIC: Normal mood and affect. Normal behavior. Normal judgment and thought content. CARDIOVASCULAR: Normal heart rate noted RESPIRATORY: Effort and breath sounds normal, no problems with respiration noted ABDOMEN: Soft, no distention noted.   PELVIC: Deferred MUSCULOSKELETAL: Normal range of motion. No edema noted.  Labs and Imaging No results found.  Assessment & Plan:  1. Abnormal uterine bleeding (AUB) Will get recent scan of uterus. Continue Depo provera q8 weeks. - US Pelvis Complete; Future - US Transvaginal Non-OB; Future  2. Perimenopausal vasomotor symptoms Continue Neurontin (will try 300 mg dosage given side effects) - gabapentin (NEURONTIN) 600 MG tablet; Take 1 tablet (600 mg total) by mouth at bedtime.  Dispense: 30 tablet; Refill: 3  Routine preventative health maintenance measures emphasized. Please refer  to After Visit Summary for other counseling recommendations.   Return in about 8 weeks (around 05/09/2016), or if symptoms worsen or fail to improve, for Depo Provera injection (on 8 week interval) - RN visit.   Total face-to-face time with patient: 15 minutes. Over 50% of encounter was spent on counseling and coordination of  care.   Verita Schneiders, MD, [place] Attending [place], [facility] for [employer], [place]

## 2016-03-14 NOTE — Patient Instructions (Signed)
Return to clinic for any scheduled appointments or for any gynecologic concerns as needed.   

## 2016-03-21 ENCOUNTER — Ambulatory Visit (HOSPITAL_COMMUNITY)
Admission: RE | Admit: 2016-03-21 | Discharge: 2016-03-21 | Disposition: A | Discharge status: Home / Self Care | Payer: Medicare Other | Source: Ambulatory Visit | Attending: Obstetrics & Gynecology | Admitting: Obstetrics & Gynecology

## 2016-03-21 DIAGNOSIS — N939 Abnormal uterine and vaginal bleeding, unspecified: Secondary | ICD-10-CM

## 2016-03-21 DIAGNOSIS — Z90721 Acquired absence of ovaries, unilateral: Secondary | ICD-10-CM | POA: Diagnosis not present

## 2016-03-21 DIAGNOSIS — N854 Malposition of uterus: Secondary | ICD-10-CM | POA: Diagnosis not present

## 2016-03-21 DIAGNOSIS — Z9851 Tubal ligation status: Secondary | ICD-10-CM | POA: Diagnosis not present

## 2016-03-22 ENCOUNTER — Telehealth: Payer: Self-pay | Admitting: *Deleted

## 2016-03-22 ENCOUNTER — Telehealth: Payer: Self-pay | Admitting: General Practice

## 2016-03-22 ENCOUNTER — Other Ambulatory Visit: Payer: Self-pay | Admitting: *Deleted

## 2016-03-22 DIAGNOSIS — C169 Malignant neoplasm of stomach, unspecified: Secondary | ICD-10-CM

## 2016-03-22 DIAGNOSIS — R634 Abnormal weight loss: Secondary | ICD-10-CM

## 2016-03-22 DIAGNOSIS — K8681 Exocrine pancreatic insufficiency: Secondary | ICD-10-CM

## 2016-03-22 DIAGNOSIS — K909 Intestinal malabsorption, unspecified: Secondary | ICD-10-CM

## 2016-03-22 DIAGNOSIS — M792 Neuralgia and neuritis, unspecified: Secondary | ICD-10-CM

## 2016-03-22 MED ORDER — HYDROCODONE-ACETAMINOPHEN 5-325 MG PO TABS: 1.0000 | ORAL_TABLET | Freq: Four times a day (QID) | ORAL | 0 refills | Status: DC | PRN

## 2016-03-22 MED ORDER — TRAMADOL HCL 50 MG PO TABS: 50.0000 mg | ORAL_TABLET | Freq: Three times a day (TID) | ORAL | 0 refills | Status: DC | PRN

## 2016-03-22 NOTE — Telephone Encounter (Signed)
-----   Message from Osborne Oman, MD sent at 03/22/2016  3:23 PM EST ----- Two small fibroids seen, otherwise normal. No surgical intervention needed as long as bleeding is controlled on medication.  Please call to inform patient of results and recommendations.

## 2016-03-22 NOTE — Telephone Encounter (Signed)
Opened in error

## 2016-03-22 NOTE — Telephone Encounter (Signed)
Called patient and got voice mail. Detailed message left regarding u/s results and no need for surgery as long as her meds are controlling her bleeding. Call back for any questions.

## 2016-03-24 ENCOUNTER — Telehealth: Payer: Self-pay | Admitting: *Deleted

## 2016-03-24 NOTE — Telephone Encounter (Signed)
Paula [name], registrar left a message Paula [name] called front desk and has questions- would like to speak to a nurse. I called Paula [name] and left a message we are calling back- if she still has questions- please call our office.

## 2016-03-28 NOTE — Telephone Encounter (Addendum)
Called and spoke w/pt regarding her concerns. She stated that she is still having a flow since receiving the Depo Provera injection on 1/22.  Currently the bleeding is light in amount. Per her report, the bleeding is not as much this week as it was last week. She is concerned that this is not normal. I advised that abnormal bleeding can be normal while taking Depo Provera. Since she is receiving the medication every 8 weeks, we expect her bleeding to improve soon. Pt voiced understanding.

## 2016-03-31 ENCOUNTER — Other Ambulatory Visit: Payer: Self-pay

## 2016-03-31 DIAGNOSIS — C163 Malignant neoplasm of pyloric antrum: Secondary | ICD-10-CM

## 2016-04-01 ENCOUNTER — Other Ambulatory Visit (HOSPITAL_BASED_OUTPATIENT_CLINIC_OR_DEPARTMENT_OTHER): Payer: Medicare Other

## 2016-04-01 ENCOUNTER — Ambulatory Visit (HOSPITAL_BASED_OUTPATIENT_CLINIC_OR_DEPARTMENT_OTHER): Payer: Medicare Other | Admitting: Family

## 2016-04-01 VITALS — BP 137/76 | HR 70 | Temp 97.9°F | Resp 17 | Wt 142.0 lb

## 2016-04-01 DIAGNOSIS — Z85028 Personal history of other malignant neoplasm of stomach: Secondary | ICD-10-CM | POA: Diagnosis not present

## 2016-04-01 DIAGNOSIS — D259 Leiomyoma of uterus, unspecified: Secondary | ICD-10-CM

## 2016-04-01 DIAGNOSIS — C169 Malignant neoplasm of stomach, unspecified: Secondary | ICD-10-CM

## 2016-04-01 DIAGNOSIS — C163 Malignant neoplasm of pyloric antrum: Secondary | ICD-10-CM

## 2016-04-01 DIAGNOSIS — D500 Iron deficiency anemia secondary to blood loss (chronic): Secondary | ICD-10-CM

## 2016-04-01 DIAGNOSIS — M545 Low back pain: Secondary | ICD-10-CM | POA: Diagnosis not present

## 2016-04-01 DIAGNOSIS — R109 Unspecified abdominal pain: Secondary | ICD-10-CM | POA: Diagnosis not present

## 2016-04-01 LAB — CBC WITH DIFFERENTIAL (CANCER CENTER ONLY)
BASO#: 0 10*3/uL (ref 0.0–0.2)
BASO%: 0.1 % (ref 0.0–2.0)
EOS ABS: 0.1 10*3/uL (ref 0.0–0.5)
EOS%: 0.7 % (ref 0.0–7.0)
HCT: 38.6 % (ref 34.8–46.6)
HGB: 12.5 g/dL (ref 11.6–15.9)
LYMPH#: 1.4 10*3/uL (ref 0.9–3.3)
LYMPH%: 20.8 % (ref 14.0–48.0)
MCH: 29.6 pg (ref 26.0–34.0)
MCHC: 32.4 g/dL (ref 32.0–36.0)
MCV: 91 fL (ref 81–101)
MONO#: 0.5 10*3/uL (ref 0.1–0.9)
MONO%: 7.3 % (ref 0.0–13.0)
NEUT#: 4.8 10*3/uL (ref 1.5–6.5)
NEUT%: 71.1 % (ref 39.6–80.0)
PLATELETS: 252 10*3/uL (ref 145–400)
RBC: 4.23 10*6/uL (ref 3.70–5.32)
RDW: 13.6 % (ref 11.1–15.7)
WBC: 6.7 10*3/uL (ref 3.9–10.0)

## 2016-04-01 LAB — COMPREHENSIVE METABOLIC PANEL (CC13)
ALK PHOS: 81 IU/L (ref 39–117)
ALT: 18 IU/L (ref 0–32)
AST (SGOT): 17 IU/L (ref 0–40)
Albumin, Serum: 4 g/dL (ref 3.5–5.5)
Albumin/Globulin Ratio: 1.4 (ref 1.2–2.2)
BILIRUBIN TOTAL: 0.5 mg/dL (ref 0.0–1.2)
BUN/Creatinine Ratio: 13 (ref 9–23)
BUN: 11 mg/dL (ref 6–24)
CHLORIDE: 106 mmol/L (ref 96–106)
Calcium, Ser: 8.9 mg/dL (ref 8.7–10.2)
Carbon Dioxide, Total: 26 mmol/L (ref 18–29)
Creatinine, Ser: 0.87 mg/dL (ref 0.57–1.00)
GFR calc Af Amer: 90 mL/min/{1.73_m2} (ref >59)
GFR calc non Af Amer: 78 mL/min/{1.73_m2} (ref >59)
GLUCOSE: 96 mg/dL (ref 65–99)
Globulin, Total: 2.9 g/dL (ref 1.5–4.5)
Potassium, Ser: 3.8 mmol/L (ref 3.5–5.2)
Sodium: 135 mmol/L (ref 134–144)
TOTAL PROTEIN: 6.9 g/dL (ref 6.0–8.5)

## 2016-04-01 NOTE — Progress Notes (Signed)
Hematology and Oncology Follow Up Visit  Paula [name] [id number] 1965-10-07 51 y.o. 04/01/2016   Principle Diagnosis:  Stage IIA (T1a, N2, M0) gastric adenocarcinoma diagnosed in July of 2015  Current Therapy:   Observation    Prior Therapy:  1) Status post diagnostic laparoscopy, distal gastrectomy with billroth II anastamosis and feeding gastrojejunostomy tube under the care of Dr. Barry Dienes on 09/20/2013. 2) Adjuvant systemic chemotherapy with 5-FU 600 mg/M2 with leucovorin on days 1, 8, 15, 22, 29 and 36 every 8 weeks. First dose 11/19/2013. 3) Adjuvant concurrent chemoradiation with Xeloda 650 MG/M2 by mouth twice a day for 5 days every week with radiation. The patient completed the course of radiotherapy but she declined to take Xeloda during her radiation.  Interim History:  [name] is here today for follow-up. She had a CT of the abdomen and pelvis last week for c/o abdominal pain and vaginal spotting. This showed no evidence of recurrent or new malignancy. She did have a 3.1 cm uterine fibroid. She will be following up with gynecology regarding treatment of this.  Her abdominal pain has improved since starting Ultram and Norco a week ago.  She still has intermittent back and abdominal pain that comes and goes. Since surgery she has episodes of nausea and vomiting several times a week.  She has bouts with fatigue quite a bit as well. CBC counts today look good. CMP pending.  No fever, chills, cough, rash, dizziness, SOB, chest pain, palpitations or changes in bowel or bladder habits.  No swelling and tenderness in her extremities. Neuropathy in her feet is unchanged. No c/o pain.  She has maintained an appetite and is staying hydrated. Her weight is stable.   Medications:  Allergies as of 04/01/2016      Reactions   Tape Itching      Medication List       Accurate as of 04/01/16  1:52 PM. Always use your most recent med list.          carvedilol 6.25 MG  tablet Commonly known as:  COREG Take 6.25 mg by mouth 2 (two) times daily with a meal.   gabapentin 600 MG tablet Commonly known as:  NEURONTIN Take 1 tablet (600 mg total) by mouth at bedtime.   HYDROcodone-acetaminophen 5-325 MG tablet Commonly known as:  NORCO/VICODIN Take 1 tablet by mouth every 6 (six) hours as needed for moderate pain.   methscopolamine 5 MG tablet Commonly known as:  PAMINE FORTE Take 5 mg by mouth every 6 (six) hours as needed.   MULTIVITAMIN & MINERAL PO Take 1 tablet by mouth daily.   omeprazole 20 MG capsule Commonly known as:  PRILOSEC Take 20 mg by mouth daily.   ondansetron 4 MG tablet Commonly known as:  ZOFRAN Take 2 tablets (8 mg total) by mouth 2 (two) times daily.   rivaroxaban 20 MG Tabs tablet Commonly known as:  XARELTO Take 1 tablet (20 mg total) by mouth daily with supper.   traMADol 50 MG tablet Commonly known as:  ULTRAM Take 1 tablet (50 mg total) by mouth 3 (three) times daily as needed.       Allergies:  Allergies  Allergen Reactions  . Tape Itching    Past Medical History, Surgical history, Social history, and Family History were reviewed and updated.  Review of Systems: All other 10 point review of systems is negative.   Physical Exam:  weight is 142 lb (64.4 kg). Her oral temperature is 97.9 F (  36.6 C). Her blood pressure is 137/76 and her pulse is 70. Her respiration is 17 and oxygen saturation is 100%.   Wt Readings from Last 3 Encounters:  04/01/16 142 lb (64.4 kg)  03/14/16 141 lb 14.4 oz (64.4 kg)  01/01/16 134 lb (60.8 kg)    Ocular: Sclerae unicteric, pupils equal, round and reactive to light Ear-nose-throat: Oropharynx clear, dentition fair Lymphatic: No cervical supraclavicular or axillary adenopathy Lungs no rales or rhonchi, good excursion bilaterally Heart regular rate and rhythm, no murmur appreciated Abd soft, mid abdominal tenderness on palpation (not a new issue for her), positive bowel  sounds, no liver or spleen tip palpated on exam.  MSK no focal spinal tenderness, no joint edema Neuro: non-focal, well-oriented, appropriate affect Breasts: Deferred  Lab Results  Component Value Date   WBC 6.7 04/01/2016   HGB 12.5 04/01/2016   HCT 38.6 04/01/2016   MCV 91 04/01/2016   PLT 252 04/01/2016   Lab Results  Component Value Date   FERRITIN 186 11/27/2015   IRON 85 11/27/2015   TIBC 203 (L) 11/27/2015   UIBC 117 (L) 11/27/2015   IRONPCTSAT 42 11/27/2015   Lab Results  Component Value Date   RBC 4.23 04/01/2016   No results found for: [facility] Lab Results  Component Value Date   IGGSERUM 1,273 05/21/2015   IGMSERUM 40 05/21/2015   No results found for: Odetta Pink, SPEI   Chemistry      Component Value Date/Time   NA 141 01/01/2016 1032   NA 140 09/24/2015 0953   K 3.7 01/01/2016 1032   K 4.0 09/24/2015 0953   CL 103 01/01/2016 1032   CO2 30 01/01/2016 1032   CO2 23 09/24/2015 0953   BUN 10 01/01/2016 1032   BUN 8.6 09/24/2015 0953   CREATININE 1.1 01/01/2016 1032   CREATININE 1.0 09/24/2015 0953      Component Value Date/Time   CALCIUM 9.2 01/01/2016 1032   CALCIUM 9.6 09/24/2015 0953   ALKPHOS 108 (H) 01/01/2016 1032   ALKPHOS 99 09/24/2015 0953   AST 19 01/01/2016 1032   AST 17 09/24/2015 0953   ALT 21 01/01/2016 1032   ALT 11 09/24/2015 0953   BILITOT 0.90 01/01/2016 1032   BILITOT 0.83 09/24/2015 0953     Impression and Plan: [name] is a very nice 51 year old African-American female with past history of stage II gastric cancer. She underwent a partial gastrectomy followed by adjuvant chemotherapy and radiation therapy. CT scan results last week showed no evidence of recurrent or new malignancy. She did have a 3.1 cm uterine fibroid and will be following up with gynecology regarding this.  She continues to do well and her new pain medication regimen  is working nicely.  We will plan to see her back in 4 months for repeat lab work and follow-up. She will contact us with any questions or concerns. We can certainly see her sooner if need be.    Eliezer Bottom, NP 2/9/20181:52 PM

## 2016-04-05 ENCOUNTER — Ambulatory Visit: Payer: Self-pay

## 2016-04-26 ENCOUNTER — Telehealth: Payer: Self-pay | Admitting: General Practice

## 2016-04-26 NOTE — Telephone Encounter (Signed)
Patient called into front office requesting to come in sooner for depo. Called Dr Harolyn Rutherford who stated patient is to receive depo every 8 weeks and no sooner as this is a new start. If depo is not working for patient she should come in to discuss other options. Called and discussed with patient. Patient verbalized understanding and states her Dr Harolyn Rutherford had discussed that if she was bleeding and having problems she could come in sooner. Told patient I just spoke with Dr Harolyn Rutherford and she did not want her to get it sooner than every 8 weeks which is March 19, right now it has only been 6 weeks. Patient states she had an ultrasound done and it showed a 3.6cm cyst and she thinks that is causing her to bleed. Told patient an ovarian cyst wouldn't cause her to bleeding. Offered appt with Dr Harolyn Rutherford to discuss options. Patient states she was previously on depo every 3 months but it recently changed to every 8 weeks because she was having problems. Told patient that is correct and it isn't 8 weeks until March 19th. Told patient it has only been 2 weeks and offered appt again with Dr Harolyn Rutherford. Patient is agreeable to appt and request to have Dr Harolyn Rutherford call her. Told patient I will pass that message along but I am uncertain if she will be able to call her. Told patient someone from the front office will contact her with an appt. Patient verbalized understanding & had no other questions

## 2016-05-04 ENCOUNTER — Ambulatory Visit: Payer: Self-pay | Admitting: Obstetrics & Gynecology

## 2016-05-09 ENCOUNTER — Ambulatory Visit (INDEPENDENT_AMBULATORY_CARE_PROVIDER_SITE_OTHER): Payer: Medicare Other | Admitting: *Deleted

## 2016-05-09 ENCOUNTER — Ambulatory Visit: Payer: Self-pay | Admitting: Obstetrics & Gynecology

## 2016-05-09 ENCOUNTER — Ambulatory Visit: Payer: Self-pay

## 2016-05-09 VITALS — BP 120/81 | HR 83 | Wt 144.4 lb

## 2016-05-09 DIAGNOSIS — N939 Abnormal uterine and vaginal bleeding, unspecified: Secondary | ICD-10-CM | POA: Diagnosis not present

## 2016-05-09 DIAGNOSIS — Z3042 Encounter for surveillance of injectable contraceptive: Secondary | ICD-10-CM | POA: Diagnosis not present

## 2016-05-09 NOTE — Progress Notes (Signed)
Depo Provera administered as scheduled - tolerated well.  Pt is receiving Depo injection every 8 weeks per Dr. Harolyn Rutherford.  Pt requests follow up visit w/Dr. Harolyn Rutherford with next injection.

## 2016-05-18 ENCOUNTER — Other Ambulatory Visit: Payer: Self-pay | Admitting: *Deleted

## 2016-05-18 DIAGNOSIS — C169 Malignant neoplasm of stomach, unspecified: Secondary | ICD-10-CM

## 2016-05-18 MED ORDER — HYDROCODONE-ACETAMINOPHEN 5-325 MG PO TABS: 1.0000 | ORAL_TABLET | Freq: Four times a day (QID) | ORAL | 0 refills | Status: DC | PRN

## 2016-05-23 ENCOUNTER — Other Ambulatory Visit: Payer: Self-pay | Admitting: Obstetrics & Gynecology

## 2016-05-23 DIAGNOSIS — Z1231 Encounter for screening mammogram for malignant neoplasm of breast: Secondary | ICD-10-CM

## 2016-05-25 ENCOUNTER — Ambulatory Visit
Admission: RE | Admit: 2016-05-25 | Discharge: 2016-05-25 | Disposition: A | Discharge status: Home / Self Care | Payer: Medicare Other | Source: Ambulatory Visit | Attending: Obstetrics & Gynecology | Admitting: Obstetrics & Gynecology

## 2016-05-25 DIAGNOSIS — Z1231 Encounter for screening mammogram for malignant neoplasm of breast: Secondary | ICD-10-CM

## 2016-06-14 ENCOUNTER — Telehealth: Payer: Self-pay | Admitting: *Deleted

## 2016-06-14 ENCOUNTER — Telehealth: Payer: Self-pay | Admitting: Radiation Oncology

## 2016-06-14 NOTE — Telephone Encounter (Signed)
I called Paula [name] back re: her symptoms.  Patient states she is having symptoms of nausea and throwing up. She also stated she believes she is feeling masses in her stomach area around where she had a prior feeding tube. She is not sure if this is scar tissue or mass. She has had good experiences recently with a GI doctor (Dr Denice Paradise) via [facility] and has gained weight with clear CT scans in Feb.  However, she is concerned about these new sx and endorses yellow sclera in the AM.  I will ask scheduling to reach out to Dr. Denice Paradise for a f/u / eval in the near future.  I think this is the most high yield way to work up her sx, perform GI labs, and consider endoscopy if appropriate. Pt pleased with this plan. I will see her in June.  -----------------------------------  Paula Gibson, MD

## 2016-06-14 NOTE — Telephone Encounter (Signed)
CALLED PATIENT TO INFORM OF FU APPT. WITH DR. Lenna Sciara [place] ON 06-22-16 @ 12:50 PM, SPOKE WITH PATIENT AND SHE IS AWARE OF THIS APPT.

## 2016-06-20 ENCOUNTER — Encounter (HOSPITAL_BASED_OUTPATIENT_CLINIC_OR_DEPARTMENT_OTHER): Payer: Self-pay

## 2016-06-20 ENCOUNTER — Emergency Department (HOSPITAL_BASED_OUTPATIENT_CLINIC_OR_DEPARTMENT_OTHER)
Admission: EM | Admit: 2016-06-20 | Discharge: 2016-06-20 | Disposition: A | Discharge status: Home / Self Care | Payer: Medicare Other | Attending: Emergency Medicine | Admitting: Emergency Medicine

## 2016-06-20 ENCOUNTER — Emergency Department (HOSPITAL_BASED_OUTPATIENT_CLINIC_OR_DEPARTMENT_OTHER): Payer: Medicare Other

## 2016-06-20 DIAGNOSIS — R112 Nausea with vomiting, unspecified: Secondary | ICD-10-CM | POA: Diagnosis not present

## 2016-06-20 DIAGNOSIS — I10 Essential (primary) hypertension: Secondary | ICD-10-CM | POA: Diagnosis not present

## 2016-06-20 DIAGNOSIS — Z87891 Personal history of nicotine dependence: Secondary | ICD-10-CM | POA: Insufficient documentation

## 2016-06-20 DIAGNOSIS — Z79899 Other long term (current) drug therapy: Secondary | ICD-10-CM | POA: Insufficient documentation

## 2016-06-20 DIAGNOSIS — R1084 Generalized abdominal pain: Secondary | ICD-10-CM | POA: Diagnosis present

## 2016-06-20 DIAGNOSIS — Z85028 Personal history of other malignant neoplasm of stomach: Secondary | ICD-10-CM | POA: Diagnosis not present

## 2016-06-20 LAB — COMPREHENSIVE METABOLIC PANEL
ALBUMIN: 4.4 g/dL (ref 3.5–5.0)
ALK PHOS: 77 U/L (ref 38–126)
ALT: 14 U/L (ref 14–54)
AST: 21 U/L (ref 15–41)
Anion gap: 8 (ref 5–15)
BUN: 10 mg/dL (ref 6–20)
CALCIUM: 9 mg/dL (ref 8.9–10.3)
CO2: 26 mmol/L (ref 22–32)
CREATININE: 0.89 mg/dL (ref 0.44–1.00)
Chloride: 101 mmol/L (ref 101–111)
GFR calc Af Amer: >60 mL/min (ref >60)
GFR calc non Af Amer: >60 mL/min (ref >60)
Glucose, Bld: 90 mg/dL (ref 65–99)
Potassium: 4.1 mmol/L (ref 3.5–5.1)
SODIUM: 135 mmol/L (ref 135–145)
Total Bilirubin: 0.3 mg/dL (ref 0.3–1.2)
Total Protein: 8 g/dL (ref 6.5–8.1)

## 2016-06-20 LAB — CBC WITH DIFFERENTIAL/PLATELET
BASOS PCT: 1 %
Basophils Absolute: 0 10*3/uL (ref 0.0–0.1)
EOS ABS: 0.1 10*3/uL (ref 0.0–0.7)
Eosinophils Relative: 2 %
HCT: 38.1 % (ref 36.0–46.0)
Hemoglobin: 12.3 g/dL (ref 12.0–15.0)
Lymphocytes Relative: 28 %
Lymphs Abs: 1.5 10*3/uL (ref 0.7–4.0)
MCH: 29.1 pg (ref 26.0–34.0)
MCHC: 32.3 g/dL (ref 30.0–36.0)
MCV: 90.1 fL (ref 78.0–100.0)
Monocytes Absolute: 0.5 10*3/uL (ref 0.1–1.0)
Monocytes Relative: 10 %
NEUTROS ABS: 3.2 10*3/uL (ref 1.7–7.7)
NEUTROS PCT: 59 %
Platelets: 308 10*3/uL (ref 150–400)
RBC: 4.23 MIL/uL (ref 3.87–5.11)
RDW: 12.8 % (ref 11.5–15.5)
WBC: 5.4 10*3/uL (ref 4.0–10.5)

## 2016-06-20 LAB — LIPASE, BLOOD: Lipase: 15 U/L (ref 11–51)

## 2016-06-20 MED ORDER — MORPHINE SULFATE 15 MG PO TABS: 15.0000 mg | ORAL_TABLET | ORAL | 0 refills | Status: DC | PRN

## 2016-06-20 MED ORDER — ONDANSETRON HCL 4 MG/2ML IJ SOLN
4.0000 mg | Freq: Once | INTRAMUSCULAR | Status: AC
  Administered 2016-06-20: 4 mg via INTRAVENOUS
  Filled 2016-06-20: qty 2

## 2016-06-20 MED ORDER — IOPAMIDOL (ISOVUE-300) INJECTION 61%
100.0000 mL | Freq: Once | INTRAVENOUS | Status: AC | PRN
  Administered 2016-06-20: 100 mL via INTRAVENOUS

## 2016-06-20 MED ORDER — SODIUM CHLORIDE 0.9 % IV BOLUS (SEPSIS)
1000.0000 mL | Freq: Once | INTRAVENOUS | Status: AC
  Administered 2016-06-20: 1000 mL via INTRAVENOUS

## 2016-06-20 MED ORDER — MORPHINE SULFATE (PF) 4 MG/ML IV SOLN
4.0000 mg | Freq: Once | INTRAVENOUS | Status: AC
  Administered 2016-06-20: 4 mg via INTRAVENOUS
  Filled 2016-06-20: qty 1

## 2016-06-20 NOTE — Discharge Instructions (Signed)
Follow up with your GI doctor

## 2016-06-20 NOTE — ED Provider Notes (Signed)
[facility] DEPT [facility] Provider Note   CSN: [id number] Arrival date & time: 06/20/16  1910   By signing my name below, I, Delton Prairie, attest that this documentation has been prepared under the direction and in the presence of Deno Etienne, DO  Electronically Signed: Delton Prairie, ED Scribe. 06/20/16. 8:37 PM.   History   Chief Complaint Chief Complaint  Patient presents with  . Abdominal Pain   HPI Comments:  Paula [name] is a 51 y.o. female, with a PMHx of stomach cancer, who presents to the Emergency Department complaining of persistent abdominal pain x 2 weeks. She also reports associated nausea, vomiting and persistent bilateral lower back pain x 2 weeks. Pt states her GI doctor advised her to come to the ED for her symptoms. No alleviating factors noted. Pt denies fevers, diarrhea or any other associated symptoms. No other complaints noted at this time.    The history is provided by the patient. No language interpreter was used.  Abdominal Pain   This is a new problem. The current episode started more than 1 week ago. The problem has not changed since onset.The pain is associated with an unknown factor. The pain is moderate. Associated symptoms include nausea, vomiting and myalgias. Pertinent negatives include fever, diarrhea, dysuria, headaches and arthralgias. Nothing aggravates the symptoms. Nothing relieves the symptoms. Past workup includes GI consult. Her past medical history is significant for GERD.   GI: Dr. Denice Paradise.   Past Medical History:  Diagnosis Date  . Costochondritis   . Esophageal reflux    On omeprazole  . Family history of cancer   . Headache(784.0) 2011   Migraines  . Heart murmur   . Hypertension    started around 2011  . Iron deficiency anemia due to chronic blood loss 05/25/2015  . 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 On antineoplastic chemotherapy    5-FU and leucovorin  . Ovarian cyst   . Pneumonia   . Pulmonary emboli ([place])   . Stomach  cancer ([place]) dx'd 08/2013    Patient Active Problem List   Diagnosis Date Noted  . Abnormal uterine bleeding (AUB) 03/14/2016  . Iron deficiency anemia due to chronic blood loss 05/25/2015  . 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 Nausea with vomiting 05/11/2015  . Healed or old pulmonary embolism 03/31/2015  . Genetic testing 01/23/2015  . Insomnia 06/30/2014  . Neutropenia ([place]) 02/24/2014  . 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 Hypoalbuminemia 12/18/2013  . Malfunction of device 12/18/2013  . Leg edema 12/18/2013  . Neoplasm related pain 12/18/2013  . Other pancytopenia ([place]) 12/13/2013  . Hyponatremia 12/12/2013  . Hypokalemia 12/12/2013  . Weight loss 12/04/2013  . PEG tube malfunction ([place]) 12/04/2013  . Dehiscence of incision 12/04/2013  . Abnormal loss of weight 12/04/2013  . Family history of cancer   . Cancer of antrum of stomach ([place]) 11/07/2013  . 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 Palliative care encounter 11/04/2013  . Nausea & vomiting 11/04/2013  . Abdominal pain, left lower quadrant 11/04/2013  . N&V (nausea and vomiting) 11/04/2013  . Acute kidney injury ([place]) 11/03/2013  . Chronic low back pain 10/31/2013  . Pain of left upper arm 10/28/2013  . 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 Constipation, chronic 10/28/2013  . Protein-calorie malnutrition, severe ([place]) 10/23/2013  . Anemia, unspecified 10/23/2013  . Intractable nausea and vomiting 10/22/2013  . Intractable vomiting 10/22/2013  . Back pain 10/10/2013  .  Malnutrition of moderate degree ([place]) 09/19/2013  . 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 Carcinoma of stomach ([place]) 09/18/2013  . Antral ulcer 09/16/2013  . Migraine headache 09/14/2013  . PTSD (post-traumatic stress disorder) 08/19/2013  . Left knee injury 07/30/2013  . Low back pain 07/30/2013  . Mitral regurgitation 02/15/2013    . Chest pain 01/23/2012  . Essential hypertension 01/23/2012  . Murmur 01/23/2012    Past Surgical History:  Procedure Laterality Date  . ESOPHAGOGASTRODUODENOSCOPY N/A 09/16/2013   Procedure: ESOPHAGOGASTRODUODENOSCOPY (EGD);  Surgeon: Wonda Horner, MD;  Location: [facility];  Service: Endoscopy;  Laterality: N/A;  . LAPAROSCOPY N/A 09/20/2013   Procedure: DIAGNOSTIC LAPAROSCOPY,DISTAL GASTRECTOMY AND FEEDING GASTROJEJUNOSTOMY;  Surgeon: [name], MD;  Location: [facility];  Service: General;  Laterality: N/A;  . OOPHORECTOMY     Around 1985 left ovary removal  . PORTACATH PLACEMENT Left 11/18/2013   Procedure: INSERTION PORT-A-CATH;  Surgeon: [name], MD;  Location: [facility];  Service: General;  Laterality: Left;  . TONSILLECTOMY     Around 1994  . TUBAL LIGATION      OB History    No data available       Home Medications    Prior to Admission medications   Medication Sig Start Date End Date Taking? Authorizing Provider  carvedilol (COREG) 6.25 MG tablet Take 6.25 mg by mouth 2 (two) times daily with a meal.    Historical Provider, MD  gabapentin (NEURONTIN) 600 MG tablet Take 1 tablet (600 mg total) by mouth at bedtime. 03/14/16   Osborne Oman, MD  HYDROcodone-acetaminophen (NORCO/VICODIN) 5-325 MG tablet Take 1 tablet by mouth every 6 (six) hours as needed for moderate pain. 05/18/16   Volanda Napoleon, MD  methscopolamine (PAMINE FORTE) 5 MG tablet Take 5 mg by mouth every 6 (six) hours as needed. 12/23/15 06/20/16  Historical Provider, MD  morphine (MSIR) 15 MG tablet Take 1 tablet (15 mg total) by mouth every 4 (four) hours as needed for severe pain. 06/20/16   Deno Etienne, DO  Multiple Vitamins-Minerals (MULTIVITAMIN & MINERAL PO) Take 1 tablet by mouth daily.    Historical Provider, MD  omeprazole (PRILOSEC) 20 MG capsule Take 20 mg by mouth daily. 05/11/15   Historical Provider, MD  ondansetron (ZOFRAN) 4 MG tablet Take 2 tablets (8 mg total) by mouth 2 (two) times  daily. 07/13/15   Volanda Napoleon, MD  rivaroxaban (XARELTO) 20 MG TABS tablet Take 1 tablet (20 mg total) by mouth daily with supper. 06/11/15   Volanda Napoleon, MD  traMADol (ULTRAM) 50 MG tablet Take 1 tablet (50 mg total) by mouth 3 (three) times daily as needed. 03/22/16   Volanda Napoleon, MD    Family History Family History  Problem Relation Age of Onset  . Hypertension Mother   . Diabetes Mellitus II Mother   . Other Mother     hx of hysterectomy for unspecified reason  . Hypertension Sister   . Hypertension Brother   . Heart failure Maternal Grandmother   . Heart Problems Maternal Grandmother   . Hypertension Maternal Grandfather   . Diabetes Maternal Grandfather   . Colon cancer Paternal Uncle     dx. late 50s-early 60s  . Gastric cancer Cousin 86  . Colon cancer Cousin     dx. early-late 43s; (father also had colon cancer)  . Colon cancer Paternal Uncle     dx. late 50s-early 60s  . Lung  cancer Paternal Uncle     dx. 90s; smoker  . Colon polyps Paternal Uncle     unknown number  . Heart attack Maternal Aunt   . Heart Problems Paternal Aunt   . Heart attack Paternal Grandfather   . Colon cancer Paternal Grandfather     dx. 50s-60s  . Colon polyps Brother     1 maternal half-brother had approx 3 polyps  . Breast cancer Cousin 60  . Breast cancer Cousin     dx. early 64s or earlier (father had colon cancer)  . Lung cancer Other     PGF's brothers (x4); dx. later in life; lim info    Social History Social History  Substance Use Topics  . Smoking status: Former Smoker    Packs/day: 1.00    Years: 16.00    Quit date: 02/21/2002  . Smokeless tobacco: Never Used     Comment: 0.5-1.0 ppd for 16 yrs  . Alcohol use No     Comment: wine at dinner previously     Allergies   Tape   Review of Systems Review of Systems  Constitutional: Negative for chills and fever.  HENT: Negative for congestion and rhinorrhea.   Eyes: Negative for redness and visual  disturbance.  Respiratory: Negative for shortness of breath and wheezing.   Cardiovascular: Negative for chest pain and palpitations.  Gastrointestinal: Positive for abdominal pain, nausea and vomiting. Negative for diarrhea.  Genitourinary: Negative for dysuria and urgency.  Musculoskeletal: Positive for myalgias. Negative for arthralgias.  Skin: Negative for pallor and wound.  Neurological: Negative for dizziness and headaches.     Physical Exam Updated Vital Signs BP 121/85 (BP Location: Left Arm)   Pulse 90   Temp 98.3 F (36.8 C) (Oral)   Resp 12   Ht 5' (1.524 m)   Wt 146 lb (66.2 kg)   SpO2 100%   BMI 28.51 kg/m   Physical Exam  Constitutional: She is oriented to person, place, and time. She appears well-developed and well-nourished. No distress.  HENT:  Head: Normocephalic and atraumatic.  Eyes: EOM are normal. Pupils are equal, round, and reactive to light.  Neck: Normal range of motion. Neck supple.  Cardiovascular: Normal rate and regular rhythm.  Exam reveals no gallop and no friction rub.   No murmur heard. Pulmonary/Chest: Effort normal. She has no wheezes. She has no rales.  Abdominal: Soft. She exhibits no distension. There is tenderness in the epigastric area.  Long midline incision with an old g-tube scar. Pain is worse in the epigastrium   Musculoskeletal: She exhibits no edema or tenderness.  Neurological: She is alert and oriented to person, place, and time.  Skin: Skin is warm and dry. She is not diaphoretic.  Psychiatric: She has a normal mood and affect. Her behavior is normal.  Nursing note and vitals reviewed.    ED Treatments / Results  DIAGNOSTIC STUDIES:  Oxygen Saturation is 100% on RA, normal by my interpretation.    COORDINATION OF CARE:  8:30 PM Discussed treatment plan with pt at bedside and pt agreed to plan.  Labs (all labs ordered are listed, but only abnormal results are displayed) Labs Reviewed  CBC WITH  DIFFERENTIAL/PLATELET  COMPREHENSIVE METABOLIC PANEL  LIPASE, BLOOD    EKG  EKG Interpretation None       Radiology Ct Abdomen Pelvis W Contrast  Result Date: 06/20/2016 CLINICAL DATA:  Epigastric pain and lower back pain for 2 weeks. EXAM: CT ABDOMEN AND PELVIS WITH  CONTRAST TECHNIQUE: Multidetector CT imaging of the abdomen and pelvis was performed using the standard protocol following bolus administration of intravenous contrast. CONTRAST:  135mL ISOVUE-300 IOPAMIDOL (ISOVUE-300) INJECTION 61% COMPARISON:  11/30/2015 FINDINGS: Lower chest: Stable scarring in the lateral right base. No acute findings. Hepatobiliary: No focal liver abnormality is seen. No gallstones, gallbladder wall thickening, or biliary dilatation. Pancreas: Unremarkable. No pancreatic ductal dilatation or surrounding inflammatory changes. Spleen: Normal in size without focal abnormality. Adrenals/Urinary Tract: Adrenal glands are unremarkable. Kidneys are normal, without renal calculi, focal lesion, or hydronephrosis. Bladder is unremarkable. Stomach/Bowel: Distal gastrectomy. Unremarkable gastrojejunostomy. Otherwise unremarkable small bowel. Appendix is normal. Colon is normal. No bowel obstruction. No bowel inflammation. Vascular/Lymphatic: No significant vascular findings are present. No enlarged abdominal or pelvic lymph nodes. Reproductive: Enlarged uterus with multiple fibroids measuring up to at least 3 cm. No adnexal abnormality. Other: No focal inflammation.  No ascites. Musculoskeletal: No significant skeletal lesion. IMPRESSION: 1. No evidence of active neoplastic disease in the abdomen or pelvis. 2. No acute findings in the abdomen or pelvis. 3. Enlarged multi fibroid uterus. Electronically Signed   By: Andreas Newport M.D.   On: 06/20/2016 22:48    Procedures Procedures (including critical care time) Procedure note: Ultrasound Guided Peripheral IV Ultrasound guided peripheral 1.88 inch angiocath IV placement  performed by me. Indications: Nursing unable to place IV. Details: The antecubital fossa and upper arm were evaluated with a multifrequency linear probe. Patent brachial veins were noted. 1 attempt was made to cannulate a vein under realtime US guidance with successful cannulation of the vein and catheter placement. There is return of non-pulsatile dark red blood. The patient tolerated the procedure well without complications. Images archived electronically.  CPT codes: [id number] and [phone]  Medications Ordered in ED Medications  sodium chloride 0.9 % bolus 1,000 mL (1,000 mLs Intravenous New Bag/Given 06/20/16 2112)  ondansetron [facility]) injection 4 mg (4 mg Intravenous Given 06/20/16 2112)  morphine 4 MG/ML injection 4 mg (4 mg Intravenous Given 06/20/16 2112)  iopamidol (ISOVUE-300) 61 % injection 100 mL (100 mLs Intravenous Contrast Given 06/20/16 2217)     Initial Impression / Assessment and Plan / ED Course  I have reviewed the triage vital signs and the nursing notes.  Pertinent labs & imaging results that were available during my care of the patient were reviewed by me and considered in my medical decision making (see chart for details).     51 yo F With a chief complaint of diffuse abdominal pain and vomiting. Patient has a history of gastric cancer and had part of her stomach removed. No acute chemotherapy and radiation as well. Over the course of the past couple weeks had worsening vomiting and pain. Called her gastroenterologist who suggested she come to the ED and be evaluated for obstruction.  CT with no acute findings.  Patient feeling much better.  Labs reassuring.  PCP/ GI follow up.   11:26 PM:  I have discussed the diagnosis/risks/treatment options with the patient and family and believe the pt to be eligible for discharge home to follow-up with PCP, GI. We also discussed returning to the ED immediately if new or worsening sx occur. We discussed the sx which are most concerning  (e.g., sudden worsening pain, fever, inability to tolerate by mouth) that necessitate immediate return. Medications administered to the patient during their visit and any new prescriptions provided to the patient are listed below.  Medications given during this visit Medications  sodium chloride 0.9 % bolus  1,000 mL (1,000 mLs Intravenous New Bag/Given 06/20/16 2112)  ondansetron [facility]) injection 4 mg (4 mg Intravenous Given 06/20/16 2112)  morphine 4 MG/ML injection 4 mg (4 mg Intravenous Given 06/20/16 2112)  iopamidol (ISOVUE-300) 61 % injection 100 mL (100 mLs Intravenous Contrast Given 06/20/16 2217)     The patient appears reasonably screen and/or stabilized for discharge and I doubt any other medical condition or other [facility] requiring further screening, evaluation, or treatment in the ED at this time prior to discharge.    Final Clinical Impressions(s) / ED Diagnoses   Final diagnoses:  Generalized abdominal pain  Nausea and vomiting, intractability of vomiting not specified, unspecified vomiting type    New Prescriptions New Prescriptions   MORPHINE (MSIR) 15 MG TABLET    Take 1 tablet (15 mg total) by mouth every 4 (four) hours as needed for severe pain.  I personally performed the services described in this documentation, which was scribed in my presence. The recorded information has been reviewed and is accurate.      Deno Etienne, DO 06/20/16 2326

## 2016-06-20 NOTE — ED Notes (Signed)
Pt has limited IV access to initiate IV, and obtain labs - notified EDP.

## 2016-06-20 NOTE — ED Triage Notes (Signed)
c/o n/v, lower back and right flank pain 2 weeks-pt with recent tx for stomach cancer-NAD-steady gait

## 2016-06-20 NOTE — ED Notes (Signed)
ED Provider at bedside. 

## 2016-06-23 ENCOUNTER — Other Ambulatory Visit (HOSPITAL_COMMUNITY): Payer: Self-pay | Admitting: Gastroenterology

## 2016-06-23 DIAGNOSIS — R112 Nausea with vomiting, unspecified: Secondary | ICD-10-CM

## 2016-06-27 ENCOUNTER — Ambulatory Visit (HOSPITAL_COMMUNITY): Payer: Medicare Other

## 2016-06-28 ENCOUNTER — Encounter (HOSPITAL_COMMUNITY): Payer: Self-pay | Admitting: Radiology

## 2016-06-28 ENCOUNTER — Ambulatory Visit (HOSPITAL_COMMUNITY)
Admission: RE | Admit: 2016-06-28 | Discharge: 2016-06-28 | Disposition: A | Discharge status: Home / Self Care | Payer: Medicare Other | Source: Ambulatory Visit | Attending: Gastroenterology | Admitting: Gastroenterology

## 2016-06-28 DIAGNOSIS — R112 Nausea with vomiting, unspecified: Secondary | ICD-10-CM | POA: Insufficient documentation

## 2016-06-28 DIAGNOSIS — Z903 Acquired absence of stomach [part of]: Secondary | ICD-10-CM | POA: Insufficient documentation

## 2016-06-30 ENCOUNTER — Ambulatory Visit (INDEPENDENT_AMBULATORY_CARE_PROVIDER_SITE_OTHER): Payer: Medicare Other

## 2016-06-30 VITALS — BP 129/76 | HR 78

## 2016-06-30 DIAGNOSIS — N951 Menopausal and female climacteric states: Secondary | ICD-10-CM

## 2016-06-30 DIAGNOSIS — Z3042 Encounter for surveillance of injectable contraceptive: Secondary | ICD-10-CM | POA: Diagnosis not present

## 2016-06-30 DIAGNOSIS — N924 Excessive bleeding in the premenopausal period: Secondary | ICD-10-CM | POA: Diagnosis not present

## 2016-06-30 NOTE — Progress Notes (Signed)
Patient presented to office for her depo-provera injection. Patient currently is on a 8 weeks cycle of getting her depo-injection however she is 4 days early. Per Dr.Anyanwu patient can have injection 4 days early.  Next injection should be around 07/26-08/09.

## 2016-07-04 ENCOUNTER — Ambulatory Visit (HOSPITAL_COMMUNITY): Payer: Medicare Other

## 2016-07-25 ENCOUNTER — Telehealth: Payer: Self-pay | Admitting: Physician Assistant

## 2016-07-25 NOTE — Telephone Encounter (Signed)
Received records from [facility] for appointment on 08/01/16 with [name].  Records put with Hao's schedule for 08/01/16. lp

## 2016-07-27 ENCOUNTER — Other Ambulatory Visit: Payer: Self-pay | Admitting: *Deleted

## 2016-07-27 DIAGNOSIS — C169 Malignant neoplasm of stomach, unspecified: Secondary | ICD-10-CM

## 2016-07-27 MED ORDER — HYDROCODONE-ACETAMINOPHEN 5-325 MG PO TABS: 1.0000 | ORAL_TABLET | Freq: Four times a day (QID) | ORAL | 0 refills | Status: DC | PRN

## 2016-07-29 ENCOUNTER — Ambulatory Visit: Payer: Medicare Other | Admitting: Physician Assistant

## 2016-08-01 ENCOUNTER — Ambulatory Visit: Payer: Medicare Other | Admitting: Physician Assistant

## 2016-08-01 ENCOUNTER — Ambulatory Visit (INDEPENDENT_AMBULATORY_CARE_PROVIDER_SITE_OTHER): Payer: Medicare Other | Admitting: Physician Assistant

## 2016-08-01 VITALS — BP 104/72 | Resp 18 | Ht 61.0 in | Wt 152.2 lb

## 2016-08-01 DIAGNOSIS — M7989 Other specified soft tissue disorders: Secondary | ICD-10-CM | POA: Diagnosis not present

## 2016-08-01 DIAGNOSIS — R002 Palpitations: Secondary | ICD-10-CM

## 2016-08-01 DIAGNOSIS — I10 Essential (primary) hypertension: Secondary | ICD-10-CM

## 2016-08-01 DIAGNOSIS — R0789 Other chest pain: Secondary | ICD-10-CM | POA: Diagnosis not present

## 2016-08-01 DIAGNOSIS — R011 Cardiac murmur, unspecified: Secondary | ICD-10-CM | POA: Diagnosis not present

## 2016-08-01 DIAGNOSIS — I340 Nonrheumatic mitral (valve) insufficiency: Secondary | ICD-10-CM | POA: Diagnosis not present

## 2016-08-01 DIAGNOSIS — I2782 Chronic pulmonary embolism: Secondary | ICD-10-CM

## 2016-08-01 NOTE — Patient Instructions (Signed)
Your physician has recommended that you wear an event monitor. Event monitors are medical devices that record the heart's electrical activity. Doctors most often [place] these monitors to diagnose arrhythmias. Arrhythmias are problems with the speed or rhythm of the heartbeat. The monitor is a small, portable device. You can wear one while you do your normal daily activities. This is usually used to diagnose what is causing palpitations/syncope (passing out).  Your physician has requested that you have an echocardiogram. Echocardiography is a painless test that uses sound waves to create images of your heart. It provides your doctor with information about the size and shape of your heart and how well your heart's chambers and valves are working. This procedure takes approximately one hour. There are no restrictions for this procedure.  Your physician recommends that you schedule a follow-up appointment in: [place] with Dr. Stanford Breed  ** please call if your chest pain becomes more frequent and/or if occurs with exertion

## 2016-08-01 NOTE — Progress Notes (Signed)
Cardiology Office Note    Date:  08/02/2016   ID:  Paula [name], DOB Apr 11, 1965, MRN [id number]  PCP:  [facility]  Cardiologist:  Dr. Stanford Breed   Chief Complaint  Patient presents with  . Follow-up    seen for Dr. Stanford Breed.    History of Present Illness:  Paula [name] is a 51 y.o. female with PMH of costochondritis, hypertension, and history of PE. She has had occasional chest pain since 1999. Previous episodes were attributed to anxiety by her report. Myoview obtained at [facility] on 12/13/2011 showed EF 74%, no obvious ischemia. Last echocardiogram obtained in January 2015 showed EF 55-60%, moderate MR, PA peak pressure 35 mmHg. She was last seen December 2016, she was having some chest pain associated with vomiting, no workup was recommended due to atypical nature of the symptom. 6 month follow-up was recommended, she has lost to follow-up since. She was seen in the emergency room in April 2018 with persistent abdominal pain for 2 weeks. CT of abdomen was negative for acute finding other than several fibroid in the uterus measuring up to 3 cm.  She continued to have intermittent chest pain, the frequency of the chest pain has not changed. It has not occurred with exertion and only at rest. I recommended her to continue observation. She was told she will continue to have abdominal pain and it is related to the previous surgical scar. For the past several month, she has been having intermittent palpitations associated with presyncope. She actually have 2 types of palpitation, the first type is the feeling of a skipped beat associated with presyncope. She also occasionally has tachycardia palpitations as well which occurs less frequently. It is unclear to me why her skipped beat with associated with presyncope. The symptom may be result of PVCs,but my concern is possible prolonged sinus pause. Today's EKG shows no obvious sign of  bradycardia. We will arrange 30 day event monitor. She continued to have heart murmur on physical exam, surprisingly is it is louder in the right upper sternal border near aortic valve area instead of mitral valve regurgitation as mentioned on previous echo, she is due for repeat echocardiogram. She does have left lower extremity swelling, she will limit salt intake and to cut back on fluid. Otherwise she is taking Xarelto for history of PE, low suspicion for DVT.   Past Medical History:  Diagnosis Date  . Costochondritis   . Esophageal reflux    On omeprazole  . Family history of cancer   . Headache(784.0) 2011   Migraines  . Heart murmur   . Hypertension    started around 2011  . Iron deficiency anemia due to chronic blood loss 05/25/2015  . 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 On antineoplastic chemotherapy    5-FU and leucovorin  . Ovarian cyst   . Pneumonia   . Pulmonary emboli ([place])   . Stomach cancer ([place]) dx'd 08/2013    Past Surgical History:  Procedure Laterality Date  . ESOPHAGOGASTRODUODENOSCOPY N/A 09/16/2013   Procedure: ESOPHAGOGASTRODUODENOSCOPY (EGD);  Surgeon: Wonda Horner, MD;  Location: [facility];  Service: Endoscopy;  Laterality: N/A;  . LAPAROSCOPY N/A 09/20/2013   Procedure: DIAGNOSTIC LAPAROSCOPY,DISTAL GASTRECTOMY AND FEEDING GASTROJEJUNOSTOMY;  Surgeon: [name], MD;  Location: [facility];  Service: General;  Laterality: N/A;  . OOPHORECTOMY     Around 1985 left ovary removal  . PORTACATH PLACEMENT Left 11/18/2013   Procedure: INSERTION PORT-A-CATH;  Surgeon: [name], MD;  Location: [facility];  Service: General;  Laterality: Left;  . TONSILLECTOMY     Around 1994  . TUBAL LIGATION      Current Medications: Outpatient Medications Prior to Visit  Medication Sig Dispense Refill  . carvedilol (COREG) 6.25 MG tablet Take 6.25 mg by mouth 2 (two) times daily with a meal.    . HYDROcodone-acetaminophen (NORCO/VICODIN) 5-325 MG tablet Take 1 tablet by mouth  every 6 (six) hours as needed for moderate pain. 60 tablet 0  . methscopolamine (PAMINE FORTE) 5 MG tablet Take 5 mg by mouth every 6 (six) hours as needed.    . Multiple Vitamins-Minerals (MULTIVITAMIN & MINERAL PO) Take 1 tablet by mouth daily.    Marland Kitchen omeprazole (PRILOSEC) 20 MG capsule Take 20 mg by mouth daily.  5  . ondansetron (ZOFRAN) 4 MG tablet Take 2 tablets (8 mg total) by mouth 2 (two) times daily. 60 tablet 3  . rivaroxaban (XARELTO) 20 MG TABS tablet Take 1 tablet (20 mg total) by mouth daily with supper. 30 tablet 6  . gabapentin (NEURONTIN) 600 MG tablet Take 1 tablet (600 mg total) by mouth at bedtime. (Patient not taking: Reported on 08/01/2016) 30 tablet 3  . morphine (MSIR) 15 MG tablet Take 1 tablet (15 mg total) by mouth every 4 (four) hours as needed for severe pain. (Patient not taking: Reported on 08/01/2016) 5 tablet 0  . traMADol (ULTRAM) 50 MG tablet Take 1 tablet (50 mg total) by mouth 3 (three) times daily as needed. (Patient not taking: Reported on 08/01/2016) 90 tablet 0   Facility-Administered Medications Prior to Visit  Medication Dose Route Frequency Provider Last Rate Last Dose  . medroxyPROGESTERone (DEPO-PROVERA) injection 150 mg  150 mg Intramuscular Q8 Weeks Anyanwu, Sallyanne Havers, MD   150 mg at 06/30/16 1419     Allergies:   Tape   Social History   Social History  . Marital status: Married    Spouse name: N/A  . Number of children: 2  . Years of education: N/A   Occupational History  .  [employer]   Social History Main Topics  . Smoking status: Former Smoker    Packs/day: 1.00    Years: 16.00    Quit date: 02/21/2002  . Smokeless tobacco: Never Used     Comment: 0.5-1.0 ppd for 16 yrs  . Alcohol use No     Comment: wine at dinner previously  . Drug use: No  . Sexual activity: Not Asked   Other Topics Concern  . None   Social History Narrative  . None     Family History:  The patient's family history includes Breast cancer in her  cousin; Breast cancer (age of onset: 2) in her cousin; Colon cancer in her cousin, paternal grandfather, paternal uncle, and paternal uncle; Colon polyps in her brother and paternal uncle; Diabetes in her maternal grandfather; Diabetes Mellitus II in her mother; Gastric cancer (age of onset: 45) in her cousin; Heart Problems in her maternal grandmother and paternal aunt; Heart attack in her maternal aunt and paternal grandfather; Heart failure in her maternal grandmother; Hypertension in her brother, maternal grandfather, mother, and sister; Lung cancer in her other and paternal uncle; Other in her mother.   ROS:   Please see the history of present illness.    ROS All other systems reviewed and are negative.   PHYSICAL EXAM:   VS:  BP 104/72 (BP Location: Left Arm, Patient  Position: Sitting, Cuff Size: Normal)   Resp 18   Ht 5\' 1"  (1.549 m)   Wt 152 lb 3.2 oz (69 kg)   BMI 28.76 kg/m    GEN: Well nourished, well developed, in no acute distress  HEENT: normal  Neck: no JVD, carotid bruits, or masses Cardiac: RRR; no murmurs, rubs, or gallops. Left lower extremity edema  Respiratory:  clear to auscultation bilaterally, normal work of breathing GI: soft, nontender, nondistended, + BS MS: no deformity or atrophy  Skin: warm and dry, no rash Neuro:  Alert and Oriented x 3, Strength and sensation are intact Psych: euthymic mood, full affect  Wt Readings from Last 3 Encounters:  08/01/16 152 lb 3.2 oz (69 kg)  06/20/16 146 lb (66.2 kg)  05/09/16 144 lb 6.4 oz (65.5 kg)      Studies/Labs Reviewed:   EKG:  EKG is ordered today.  The ekg ordered today demonstrates Normal sinus rhythm, T-wave inversion in lead 3, otherwise nonspecific T wave changes.  Recent Labs: 06/20/2016: ALT 14; BUN 10; Creatinine, Ser 0.89; Hemoglobin 12.3; Platelets 308; Potassium 4.1; Sodium 135   Lipid Panel    Component Value Date/Time   TRIG 88 11/04/2013 0410    Additional studies/ records that were  reviewed today include:   Myoview 12/13/2011     Echo 03/05/2013 LV EF: 55% -  60%  Study Conclusions  - Left ventricle: The cavity size was normal. Wall thickness was normal. Systolic function was normal. The estimated ejection fraction was in the range of 55% to 60%. Wall motion was normal; there were no regional wall motion abnormalities. - Mitral valve: Moderate regurgitation. - Pulmonary arteries: PA peak pressure: 56mm Hg (S).  ASSESSMENT:    1. Atypical chest pain   2. Mitral valve insufficiency, unspecified etiology   3. Murmur   4. Palpitations   5. Swelling of left lower extremity   6. Essential hypertension   7. Other chronic pulmonary embolism without acute cor pulmonale (HCC)      PLAN:  In order of problems listed above:  1. Atypical chest pain: She has chronic history of chest pain, her chest pain is very atypical, does not occur with exertion. I recommend continued observation for now. Low suspicion for PE since she has been compliant with Xarelto.  2. Palpitation: She actually has to type palpitation according to her history, one type is a single skipped beat associated with presyncope. Although PVCs can cause such symptom, my concern is possible prolonged sinus pauses which can induce syncope. A second type of palpitation is tachycardia palpitations lasting several minutes. I recommend a 30 day event monitor to further assess.  3. Left lower extremity edema: Unclear why she is having swelling only in the left lower extremity, I recommended conservative management. I do not recommend diuretic at this time. Low suspicion of DVT given the fact she has been compliant with Xarelto.  4. Heart murmur: She has a history of mitral regurgitation on previous echocardiogram, surprisingly today's heart murmur is louder near the aortic valve area. I will repeat echocardiogram.  5. Hypertension: Blood pressure well controlled on today's physical  exam.  6. History of PE: On Xarelto.    Medication Adjustments/Labs and Tests Ordered: Current medicines are reviewed at length with the patient today.  Concerns regarding medicines are outlined above.  Medication changes, Labs and Tests ordered today are listed in the Patient Instructions below. Patient Instructions  Your physician has recommended that you wear an event  monitor. Event monitors are medical devices that record the heart's electrical activity. Doctors most often [place] these monitors to diagnose arrhythmias. Arrhythmias are problems with the speed or rhythm of the heartbeat. The monitor is a small, portable device. You can wear one while you do your normal daily activities. This is usually used to diagnose what is causing palpitations/syncope (passing out).  Your physician has requested that you have an echocardiogram. Echocardiography is a painless test that uses sound waves to create images of your heart. It provides your doctor with information about the size and shape of your heart and how well your heart's chambers and valves are working. This procedure takes approximately one hour. There are no restrictions for this procedure.  Your physician recommends that you schedule a follow-up appointment in: [place] with Dr. Stanford Breed  ** please call if your chest pain becomes more frequent and/or if occurs with exertion     Signed, [name], [place]  08/02/2016 9:47 AM    [address], [place], [place]  [address] Phone: [phone]; Fax: [phone]

## 2016-08-02 ENCOUNTER — Encounter: Payer: Self-pay | Admitting: Physician Assistant

## 2016-08-03 NOTE — Addendum Note (Signed)
Addended by: Milderd Meager on: 08/03/2016 01:31 PM   Modules accepted: Orders

## 2016-08-04 ENCOUNTER — Other Ambulatory Visit: Payer: Medicare Other

## 2016-08-04 ENCOUNTER — Ambulatory Visit: Payer: Medicare Other | Admitting: Hematology & Oncology

## 2016-08-08 ENCOUNTER — Other Ambulatory Visit (HOSPITAL_BASED_OUTPATIENT_CLINIC_OR_DEPARTMENT_OTHER): Payer: Medicare Other

## 2016-08-08 ENCOUNTER — Ambulatory Visit (HOSPITAL_BASED_OUTPATIENT_CLINIC_OR_DEPARTMENT_OTHER): Payer: Medicare Other | Admitting: Hematology & Oncology

## 2016-08-08 VITALS — BP 116/74 | HR 72 | Temp 98.2°F | Resp 17 | Wt 148.0 lb

## 2016-08-08 DIAGNOSIS — C169 Malignant neoplasm of stomach, unspecified: Secondary | ICD-10-CM

## 2016-08-08 DIAGNOSIS — D510 Vitamin B12 deficiency anemia due to intrinsic factor deficiency: Secondary | ICD-10-CM

## 2016-08-08 DIAGNOSIS — Z85028 Personal history of other malignant neoplasm of stomach: Secondary | ICD-10-CM

## 2016-08-08 DIAGNOSIS — R109 Unspecified abdominal pain: Secondary | ICD-10-CM

## 2016-08-08 DIAGNOSIS — D500 Iron deficiency anemia secondary to blood loss (chronic): Secondary | ICD-10-CM | POA: Diagnosis not present

## 2016-08-08 DIAGNOSIS — K909 Intestinal malabsorption, unspecified: Secondary | ICD-10-CM

## 2016-08-08 DIAGNOSIS — G8929 Other chronic pain: Secondary | ICD-10-CM | POA: Diagnosis not present

## 2016-08-08 DIAGNOSIS — C163 Malignant neoplasm of pyloric antrum: Secondary | ICD-10-CM

## 2016-08-08 LAB — COMPREHENSIVE METABOLIC PANEL (CC13)
A/G RATIO: 1.4 (ref 1.2–2.2)
ALT: 8 IU/L (ref 0–32)
AST (SGOT): 11 IU/L (ref 0–40)
Albumin, Serum: 4 g/dL (ref 3.5–5.5)
Alkaline Phosphatase, S: 75 IU/L (ref 39–117)
BUN/Creatinine Ratio: 11 (ref 9–23)
BUN: 11 mg/dL (ref 6–24)
Bilirubin Total: 0.5 mg/dL (ref 0.0–1.2)
CALCIUM: 8.9 mg/dL (ref 8.7–10.2)
Carbon Dioxide, Total: 24 mmol/L (ref 20–29)
Chloride, Ser: 101 mmol/L (ref 96–106)
Creatinine, Ser: 0.98 mg/dL (ref 0.57–1.00)
GFR, EST AFRICAN AMERICAN: 78 mL/min/{1.73_m2} (ref >59)
GFR, EST NON AFRICAN AMERICAN: 68 mL/min/{1.73_m2} (ref >59)
GLOBULIN, TOTAL: 2.9 g/dL (ref 1.5–4.5)
Glucose: 135 mg/dL — ABNORMAL HIGH (ref 65–99)
POTASSIUM: 3.9 mmol/L (ref 3.5–5.2)
SODIUM: 130 mmol/L — AB (ref 134–144)
TOTAL PROTEIN: 6.9 g/dL (ref 6.0–8.5)

## 2016-08-08 LAB — CBC WITH DIFFERENTIAL (CANCER CENTER ONLY)
BASO#: 0 10*3/uL (ref 0.0–0.2)
BASO%: 0.2 % (ref 0.0–2.0)
EOS%: 0.5 % (ref 0.0–7.0)
Eosinophils Absolute: 0 10*3/uL (ref 0.0–0.5)
HEMATOCRIT: 35.4 % (ref 34.8–46.6)
HEMOGLOBIN: 11.6 g/dL (ref 11.6–15.9)
LYMPH#: 1.2 10*3/uL (ref 0.9–3.3)
LYMPH%: 19.2 % (ref 14.0–48.0)
MCH: 29.1 pg (ref 26.0–34.0)
MCHC: 32.8 g/dL (ref 32.0–36.0)
MCV: 89 fL (ref 81–101)
MONO#: 0.6 10*3/uL (ref 0.1–0.9)
MONO%: 10.1 % (ref 0.0–13.0)
NEUT#: 4.4 10*3/uL (ref 1.5–6.5)
NEUT%: 70 % (ref 39.6–80.0)
Platelets: 288 10*3/uL (ref 145–400)
RBC: 3.99 10*6/uL (ref 3.70–5.32)
RDW: 14.3 % (ref 11.1–15.7)
WBC: 6.2 10*3/uL (ref 3.9–10.0)

## 2016-08-08 NOTE — Progress Notes (Signed)
Hematology and Oncology Follow Up Visit  Paula [name] [id number] 03-14-1965 51 y.o. 08/08/2016   Principle Diagnosis:  Stage IIA (T1a, N2, M0) gastric adenocarcinoma diagnosed in July of 2015  Current Therapy:   Observation    Prior Therapy:  1) Status post diagnostic laparoscopy, distal gastrectomy with billroth II anastamosis and feeding gastrojejunostomy tube under the care of Dr. Barry Dienes on 09/20/2013. 2) Adjuvant systemic chemotherapy with 5-FU 600 mg/M2 with leucovorin on days 1, 8, 15, 22, 29 and 36 every 8 weeks. 3) Adjuvant concurrent chemoradiation with Xeloda 650 mg/M2 by mouth twice a day for 5 days every week with radiation. The patient completed the course of radiotherapy but she declined to take Xeloda during her radiation.  Interim History:  Paula [name] is here today for follow-up. She is feeling okay. She is feeling a little more tired. She is having some back problems. She sees her doctor for this.. She's still having some issues with reflux. If she is on Xarelto. I'm not sure why she is still on Xarelto. We will have to figure this out when she gets back. Her last vascular studies have not shown any thromboembolic disease.   She's had no bleeding. There's been no obvious change in bowel or bladder habits.  She's had no cough. There's no shortness of breath.  Her iron studies done back in October showed a ferritin of 186 with a iron saturation 42%.  Currently, her performance status is ECOG 1.  Back and really does help with her chronic abdominal pain. She has a lot of scar tissue which I'm sure is causing issues for her.  Medications:  Allergies as of 08/08/2016      Reactions   Tape Itching      Medication List       Accurate as of 08/08/16  3:13 PM. Always use your most recent med list.          carvedilol 6.25 MG tablet Commonly known as:  COREG Take 6.25 mg by mouth 2 (two) times daily with a meal.   gabapentin 600 MG  tablet Commonly known as:  NEURONTIN Take 1 tablet (600 mg total) by mouth at bedtime.   HYDROcodone-acetaminophen 5-325 MG tablet Commonly known as:  NORCO/VICODIN Take 1 tablet by mouth every 6 (six) hours as needed for moderate pain.   methscopolamine 5 MG tablet Commonly known as:  PAMINE FORTE Take 5 mg by mouth every 6 (six) hours as needed.   morphine 15 MG tablet Commonly known as:  MSIR Take 1 tablet (15 mg total) by mouth every 4 (four) hours as needed for severe pain.   MULTIVITAMIN & MINERAL PO Take 1 tablet by mouth daily.   omeprazole 20 MG capsule Commonly known as:  PRILOSEC Take 20 mg by mouth daily.   ondansetron 4 MG tablet Commonly known as:  ZOFRAN Take 2 tablets (8 mg total) by mouth 2 (two) times daily.   rivaroxaban 20 MG Tabs tablet Commonly known as:  XARELTO Take 1 tablet (20 mg total) by mouth daily with supper.       Allergies:  Allergies  Allergen Reactions  . Tape Itching    Past Medical History, Surgical history, Social history, and Family History were reviewed and updated.  Review of Systems: All other 10 point review of systems is negative.   Physical Exam:  weight is 148 lb (67.1 kg). Her oral temperature is 98.2 F (36.8 C). Her blood pressure is 116/74 and her pulse is  72. Her respiration is 17 and oxygen saturation is 100%.   Wt Readings from Last 3 Encounters:  08/08/16 148 lb (67.1 kg)  08/01/16 152 lb 3.2 oz (69 kg)  06/20/16 146 lb (66.2 kg)     Head and neck exam shows no ocular or oral lesions. There is no adenopathy in her neck. There is no scleral icterus. Thyroid is nonpalpable. Lungs are clear. Cardiac exam regular rate and rhythm with no murmurs, rubs or bruits. Abdomen is soft. She has good bowel sounds. There is no fluid wave. There is no palpable liver or spleen tip. She has well-healed laparotomy scars. Extremity shows no clubbing, cyanosis or edema. Back exam shows no tenderness over the spine, ribs or hips.  Extremities shows no clubbing, cyanosis or edema. Skin exam shows no rashes, ecchymoses or petechia.d  Lab Results  Component Value Date   WBC 6.2 08/08/2016   HGB 11.6 08/08/2016   HCT 35.4 08/08/2016   MCV 89 08/08/2016   PLT 288 08/08/2016   Lab Results  Component Value Date   FERRITIN 186 11/27/2015   IRON 85 11/27/2015   TIBC 203 (L) 11/27/2015   UIBC 117 (L) 11/27/2015   IRONPCTSAT 42 11/27/2015   Lab Results  Component Value Date   RBC 3.99 08/08/2016   No results found for: Nils Pyle [facility] Lab Results  Component Value Date   IGGSERUM 1,273 05/21/2015   IGMSERUM 40 05/21/2015   No results found for: Kathrynn Ducking, MSPIKE, SPEI   Chemistry      Component Value Date/Time   NA 135 06/20/2016 2103   NA 135 04/01/2016 1321   NA 141 01/01/2016 1032   NA 140 09/24/2015 0953   K 4.1 06/20/2016 2103   K 3.8 04/01/2016 1321   K 3.7 01/01/2016 1032   K 4.0 09/24/2015 0953   CL 101 06/20/2016 2103   CL 106 04/01/2016 1321   CL 103 01/01/2016 1032   CO2 26 06/20/2016 2103   CO2 26 04/01/2016 1321   CO2 30 01/01/2016 1032   CO2 23 09/24/2015 0953   BUN 10 06/20/2016 2103   BUN 11 04/01/2016 1321   BUN 10 01/01/2016 1032   BUN 8.6 09/24/2015 0953   CREATININE 0.89 06/20/2016 2103   CREATININE 0.87 04/01/2016 1321   CREATININE 1.1 01/01/2016 1032   CREATININE 1.0 09/24/2015 0953      Component Value Date/Time   CALCIUM 9.0 06/20/2016 2103   CALCIUM 8.9 04/01/2016 1321   CALCIUM 9.2 01/01/2016 1032   CALCIUM 9.6 09/24/2015 0953   ALKPHOS 77 06/20/2016 2103   ALKPHOS 81 04/01/2016 1321   ALKPHOS 108 (H) 01/01/2016 1032   ALKPHOS 99 09/24/2015 0953   AST 21 06/20/2016 2103   AST 17 04/01/2016 1321   AST 19 01/01/2016 1032   AST 17 09/24/2015 0953   ALT 14 06/20/2016 2103   ALT 18 04/01/2016 1321   ALT 21 01/01/2016 1032   ALT 11 09/24/2015 0953   BILITOT 0.3 06/20/2016 2103   BILITOT 0.5  04/01/2016 1321   BILITOT 0.90 01/01/2016 1032   BILITOT 0.83 09/24/2015 0953     Impression and Plan: Paula [name] is a very nice 51 year old African-American female with past history of stage II gastric cancer. She underwent a partial gastrectomy followed by adjuvant chemotherapy and radiation therapy.   He'll be 3 years in June that she  had surgery. Other she completed all of her adjuvant therapy in January  2016.  There is still a risk of recurrence. However, I think that risk is going to be fairly low right now.  I do not see need for any scans right now.  We'll see what her iron studies show.  She has a lot going on in her life. She is doing a lot of fun to work. I really want to make sure that she is able to help others who are less fortunate.   Volanda Napoleon, MD 6/18/20183:13 PM

## 2016-08-09 LAB — IRON AND TIBC
%SAT: 30 % (ref 21–57)
Iron: 72 ug/dL (ref 41–142)
TIBC: 239 ug/dL (ref 236–444)
UIBC: 167 ug/dL (ref 120–384)

## 2016-08-09 LAB — FERRITIN: FERRITIN: 121 ng/mL (ref 9–269)

## 2016-08-15 ENCOUNTER — Telehealth: Payer: Self-pay | Admitting: Cardiology

## 2016-08-15 ENCOUNTER — Ambulatory Visit (INDEPENDENT_AMBULATORY_CARE_PROVIDER_SITE_OTHER): Payer: Medicare Other

## 2016-08-15 ENCOUNTER — Ambulatory Visit (HOSPITAL_COMMUNITY): Payer: Medicare Other | Attending: Internal Medicine

## 2016-08-15 ENCOUNTER — Other Ambulatory Visit: Payer: Self-pay

## 2016-08-15 DIAGNOSIS — R002 Palpitations: Secondary | ICD-10-CM

## 2016-08-15 DIAGNOSIS — I340 Nonrheumatic mitral (valve) insufficiency: Secondary | ICD-10-CM

## 2016-08-15 DIAGNOSIS — R011 Cardiac murmur, unspecified: Secondary | ICD-10-CM

## 2016-08-15 NOTE — Telephone Encounter (Signed)
Patient called and notified that her lab results have not been resulted yet. When they have someone will call and give her the results. She verbalized her understanding.

## 2016-08-15 NOTE — Telephone Encounter (Signed)
New message    Pt is calling stating she is returning call about lab results.

## 2016-08-16 ENCOUNTER — Telehealth: Payer: Self-pay | Admitting: Cardiology

## 2016-08-16 DIAGNOSIS — Z79899 Other long term (current) drug therapy: Secondary | ICD-10-CM

## 2016-08-16 DIAGNOSIS — E785 Hyperlipidemia, unspecified: Secondary | ICD-10-CM

## 2016-08-16 MED ORDER — ATORVASTATIN CALCIUM 20 MG PO TABS: 20.0000 mg | ORAL_TABLET | Freq: Every day | ORAL | 5 refills | Status: DC

## 2016-08-16 NOTE — Telephone Encounter (Signed)
-----   Message from [place], [place] sent at 08/10/2016  5:24 PM EDT ----- Outside lab result reviewed, bad cholesterol and total cholesterol has been high. One option is to start 20mg  daily lipitor and repeat FLP and LFT in 6 weeks, alternatively if [name] does not wish to start statin, I would encourage diet and exercise to 6 month and repeat FLP

## 2016-08-16 NOTE — Telephone Encounter (Signed)
Left msg to call.

## 2016-08-16 NOTE — Telephone Encounter (Signed)
°  Follow Up   Pt returning call regarding results. Please call.

## 2016-08-16 NOTE — Telephone Encounter (Signed)
Results and recommendations discussed with patient, who verbalized understanding and thanks. She was agreeable to starting lipitor and checking labs in 6 weeks here at office. Instructions relayed.

## 2016-08-17 ENCOUNTER — Ambulatory Visit
Admission: RE | Admit: 2016-08-17 | Discharge: 2016-08-17 | Disposition: A | Discharge status: Home / Self Care | Payer: Medicare Other | Source: Ambulatory Visit | Attending: Radiation Oncology | Admitting: Radiation Oncology

## 2016-08-17 ENCOUNTER — Encounter: Payer: Self-pay | Admitting: Radiation Oncology

## 2016-08-17 ENCOUNTER — Telehealth: Payer: Self-pay | Admitting: *Deleted

## 2016-08-17 VITALS — BP 118/70 | HR 67 | Temp 98.2°F | Ht 61.0 in | Wt 146.8 lb

## 2016-08-17 DIAGNOSIS — M545 Low back pain: Secondary | ICD-10-CM | POA: Insufficient documentation

## 2016-08-17 DIAGNOSIS — Z7901 Long term (current) use of anticoagulants: Secondary | ICD-10-CM | POA: Diagnosis not present

## 2016-08-17 DIAGNOSIS — C163 Malignant neoplasm of pyloric antrum: Secondary | ICD-10-CM

## 2016-08-17 DIAGNOSIS — Z79899 Other long term (current) drug therapy: Secondary | ICD-10-CM | POA: Diagnosis not present

## 2016-08-17 DIAGNOSIS — Z923 Personal history of irradiation: Secondary | ICD-10-CM | POA: Insufficient documentation

## 2016-08-17 DIAGNOSIS — Z85028 Personal history of other malignant neoplasm of stomach: Secondary | ICD-10-CM | POA: Diagnosis present

## 2016-08-17 DIAGNOSIS — C169 Malignant neoplasm of stomach, unspecified: Secondary | ICD-10-CM

## 2016-08-17 NOTE — Telephone Encounter (Signed)
CALLED PATIENT TO INFORM OF 1 YR. FU ON 08-16-17 @ 11 AM WITH DR. Isidore Moos, SPOKE WITH PATIENT AND SHE IS AWARE OF THIS APPT.

## 2016-08-17 NOTE — Progress Notes (Signed)
Radiation Oncology         [phone]) [phone] ________________________________  Name: Paula [name] MRN: [id number]  Date: 08/17/2016  DOB: 21-Aug-1965  Follow-Up Visit Note  Outpatient  CC: [facility]  Curt Bears, MD  Diagnosis and Prior Radiotherapy: C16.3 ICD code    ICD-10-CM   1. 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  She had a CT scan of the abdomen and pelvis on 06/20/16 which showed no evidence of active disease. She has not had any recent imaging of her chest. She is now following with Dr. Marin Olp of Medical Oncology. She is following with a Copywriter, advertising as well. Two months ago she was seen in the ED for diffuse abdominal pain and vomiting. The CT scan as above and her labs were reassuring. She was discharged with PCP/GI follow up. She is following with Cardiology.   Of note, there was no significant skeletal lesion in the CT Abdomen/Pelvis performed in April. She does report lower back pain at this time. This scan did show enlarged multifibroid uterus. The bases of her lungs were clear of any sign of metastatic disease on CT imaging. Additionally, the patient has gained 23 lbs since I saw her 1 year ago.  On review of systems, the patient reports lower back pain and occasional fatigue. She has intermittent nausea, and takes medicine for this occassionally. She reports she is otherwise eating well. The patient expresses concerns about cancer spread based on her lower back pain.  She last saw Dr. Marin Olp on 08/08/16 and will follow up with him again on 11/01/16.   ALLERGIES:  is allergic to tape.  Meds: Current  Outpatient Prescriptions  Medication Sig Dispense Refill  . atorvastatin (LIPITOR) 20 MG tablet Take 1 tablet (20 mg total) by mouth daily. 30 tablet 5  . carvedilol (COREG) 6.25 MG tablet Take 6.25 mg by mouth 2 (two) times daily with a meal.    . HYDROcodone-acetaminophen (NORCO/VICODIN) 5-325 MG tablet Take 1 tablet by mouth every 6 (six) hours as needed for moderate pain. 60 tablet 0  . Multiple Vitamins-Minerals (MULTIVITAMIN & MINERAL PO) Take 1 tablet by mouth daily.    Marland Kitchen omeprazole (PRILOSEC) 20 MG capsule Take 20 mg by mouth daily.  5  . ondansetron (ZOFRAN) 4 MG tablet Take 2 tablets (8 mg total) by mouth 2 (two) times daily. 60 tablet 3  . promethazine (PHENERGAN) 12.5 MG tablet Take 12.5 mg by mouth every 6 (six) hours as needed for nausea or vomiting.    . rivaroxaban (XARELTO) 20 MG TABS tablet Take 1 tablet (20 mg total) by mouth daily with supper. 30 tablet 6  . gabapentin (NEURONTIN) 600 MG tablet Take 1 tablet (600 mg total) by mouth at bedtime. (Patient not taking: Reported on 08/01/2016) 30 tablet 3  . methscopolamine (PAMINE FORTE) 5 MG tablet Take 5 mg by mouth every 6 (six) hours as needed.     Current Facility-Administered Medications  Medication Dose Route Frequency Provider Last Rate Last Dose  . medroxyPROGESTERone (DEPO-PROVERA) injection 150 mg  150 mg Intramuscular Q8 Weeks Anyanwu, Ugonna A, MD   150 mg at 06/30/16 1419  REVIEW OF SYSTEMS:as above  Physical Findings: The patient is in no acute distress. Patient is alert and oriented.  height is 5\' 1"  (1.549 m) and weight is 146 lb 12.8 oz (66.6 kg). Her temperature is 98.2 F (36.8 C). Her blood pressure is 118/70 and her pulse is 67. Her oxygen saturation is 100%.   General: Alert and oriented, in no acute distress. HEENT: Head is normocephalic. Some small hyperpigmented spots on her tongue. Neck: Neck is supple, no palpable cervical or supraclavicular lymphadenopathy. Heart: Regular in rate and rhythm. No  obvious murmurs. Chest: Clear to auscultation bilaterally. Abdomen: Soft, non distended. No rigidity or guarding, but he has diffuse tenderness in her abdomen. Extremities: No edema. Lymphatics: see Neck Exam Skin: No concerning lesions. Musculoskeletal: symmetric strength and muscle tone throughout. She has tenderness to palpation from the top of the lumbar spine through the sacrum. Neurologic: No obvious focalities. Speech is fluent. Coordination is intact. Psychiatric: Judgment and insight are intact. Affect is appropriate.  Lab Findings: Lab Results  Component Value Date   WBC 6.2 08/08/2016   HGB 11.6 08/08/2016   HCT 35.4 08/08/2016   MCV 89 08/08/2016   PLT 288 08/08/2016    Radiographic Findings: As above; I reviewed her scans  Impression/Plan: NED on clinical exam and on recent imaging.  I offered to refer the patient to PT for evaluation of the patient's lower back pain.  The patient has been experiencing ongoing nausea and has continued to follow with her gastroenterologist regarding this. The source of her vomiting has not been determined at this time. Follow up PRN with GI.  The patient is scheduled to meet with Dr. Marin Olp on 11/01/16.  Follow up with me in 1 year.  I spent 15 minutes face to face with the patient and more than 50% of that time was spent in counseling and/or coordination of care. _____________________________________   Eppie Gibson, MD  This document serves as a record of services personally performed by Eppie Gibson, MD. It was created on her behalf by Maryla Morrow, a trained medical scribe. The creation of this record is based on the scribe's personal observations and the provider's statements to them. This document has been checked and approved by the attending provider.

## 2016-08-17 NOTE — Progress Notes (Signed)
[name] presents for follow up of radiation completed 06/17/2014 to her post op gastric region. She reports lower back pain. She has her heart being monitored remotely at his time due to a leaking valve. She reports occasional fatigue. She has nausea and will take nausea medicine occasionally. She tells me she is eating well. She saw Dr. Marin Olp last on 08/08/16 and will see him again 0n 11/01/16. She is concerned about cancer spread to her kidneys or liver because of her lower back pain.   BP 118/70   Pulse 67   Temp 98.2 F (36.8 C)   Ht 5\' 1"  (1.549 m)   Wt 146 lb 12.8 oz (66.6 kg)   SpO2 100% Comment: room air  BMI 27.74 kg/m    Wt Readings from Last 3 Encounters:  08/17/16 146 lb 12.8 oz (66.6 kg)  08/08/16 148 lb (67.1 kg)  08/01/16 152 lb 3.2 oz (69 kg)

## 2016-08-18 ENCOUNTER — Telehealth: Payer: Self-pay | Admitting: *Deleted

## 2016-08-18 NOTE — Telephone Encounter (Signed)
CALLED PATIENT TO INFORM OF PT APPT. FOR 08-26-16 @ 10:15 AM WITH TERESA BROWN @ [facility], SPOKE WITH PATIENT AND SHE IS AWARE OF THIS APPT.

## 2016-08-26 ENCOUNTER — Ambulatory Visit: Payer: Medicare Other | Admitting: Physical Therapy

## 2016-08-30 ENCOUNTER — Ambulatory Visit: Payer: Medicare Other | Admitting: Physical Therapy

## 2016-08-30 ENCOUNTER — Ambulatory Visit: Payer: Medicare Other | Attending: Radiation Oncology | Admitting: Physical Therapy

## 2016-08-30 DIAGNOSIS — R29898 Other symptoms and signs involving the musculoskeletal system: Secondary | ICD-10-CM | POA: Diagnosis present

## 2016-08-30 DIAGNOSIS — M545 Low back pain: Secondary | ICD-10-CM | POA: Insufficient documentation
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
## 2016-08-31 ENCOUNTER — Ambulatory Visit: Payer: Medicare Other | Admitting: Physical Therapy

## 2016-08-31 DIAGNOSIS — M545 Low back pain: Secondary | ICD-10-CM

## 2016-08-31 DIAGNOSIS — R29898 Other symptoms and signs involving the musculoskeletal system: Secondary | ICD-10-CM

## 2016-08-31 NOTE — Therapy (Signed)
[place], [place], [address] Phone: [phone]   Fax:  [phone]  Physical Therapy Treatment  Patient Details  Name: Paula [name] MRN: [id number] Date of Birth: [birth year] Referring Provider: Dr. Eppie Gibson  Encounter Date: 08/31/2016      PT End of Session - 08/31/16 1719    Visit Number 2   Number of Visits 9   Date for PT Re-Evaluation 10/07/16   PT Start Time 1525   PT Stop Time 1612   PT Time Calculation (min) 47 min   Activity Tolerance Patient tolerated treatment well   Behavior During Therapy [facility] for tasks assessed/performed      Past Medical History:  Diagnosis Date  . Costochondritis   . Esophageal reflux    On omeprazole  . Family history of cancer   . Headache(784.0) 2011   Migraines  . Heart murmur   . Hypertension    started around 2011  . Iron deficiency anemia due to chronic blood loss 05/25/2015  . 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 On antineoplastic chemotherapy    5-FU and leucovorin  . Ovarian cyst   . Pneumonia   . Pulmonary emboli ([place])   . 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 ESOPHAGOGASTRODUODENOSCOPY N/A 09/16/2013   Procedure: ESOPHAGOGASTRODUODENOSCOPY (EGD);  Surgeon: Wonda Horner, MD;  Location: [facility];  Service: Endoscopy;  Laterality: N/A;  . LAPAROSCOPY N/A 09/20/2013   Procedure: DIAGNOSTIC LAPAROSCOPY,DISTAL GASTRECTOMY AND FEEDING GASTROJEJUNOSTOMY;  Surgeon: [name], MD;  Location: [facility];  Service: General;  Laterality: N/A;  . OOPHORECTOMY     Around 1985 left ovary removal  . PORTACATH PLACEMENT Left 11/18/2013   Procedure: INSERTION PORT-A-CATH;  Surgeon: [name], MD;  Location: [facility];  Service: General;  Laterality: Left;  . TONSILLECTOMY     Around 1994  . TUBAL LIGATION      There were no vitals filed for this visit.      Subjective Assessment -  08/31/16 1528    Subjective Pain was worse this morning--it felt like the world was on my shoulders, tense and tight, mostly on my right side of my back. "They said I could take my heart monitor off for a few minutes so I took it off in case you want to try that treatment."   Currently in Pain? Yes   Pain Score 5    Pain Location Back   Pain Orientation Right;Lower   Pain Descriptors / Indicators Aching   Aggravating Factors  just is that way   Pain Relieving Factors heating pad            [facility] PT Assessment - 08/31/16 0001      Precautions   Precaution Comments Okay to do e-stim IF she has the cardiac monitor off.     Palpation   Palpation comment back paraspinals feel moderately tight; pt. reports tenderness with light palpation along her lower spine                     [facility] Adult PT Treatment/Exercise - 08/31/16 0001      Lumbar Exercises: Stretches   Passive Hamstring Stretch 2 reps;30 seconds  each side, supine with strap   Single Knee to Chest Stretch 5 reps;10 seconds  right and left     Modalities   Modalities Electrical Stimulation;Moist Heat     Moist Heat Therapy  Number Minutes Moist Heat 15 Minutes   Moist Heat Location Lumbar Spine  in prone     Electrical Stimulation   Electrical Stimulation Location low back   Electrical Stimulation Action interferential   Electrical Stimulation Parameters 80-150 Hz, intensity at 7 today   Electrical Stimulation Goals Pain                PT Education - 08/31/16 1717    Education provided Yes   Education Details GENTLE single knee to chest, lower trunk rotation, and hamstring stretches   Person(s) Educated Patient   Methods Explanation;Demonstration;Verbal cues;Handout   Comprehension Verbalized understanding;Returned demonstration                [facility] Goals - Sep 04, 2016 1305      CC Long Term Goal  #1   Title Pt. will report at least 50% decrease in lower back pain    Time 4   Period Weeks   Status New     CC Long Term Goal  #2   Title Pt. will be independent in home exercise program for trunk flexibility and strengthening   Time 4   Period Weeks   Status New     CC Long Term Goal  #3   Title Pt. will show low back AROM not limited by more than 15% any direction for improved ADLs.   Time 4   Period Weeks   Status New            Plan - 08/31/16 1720    Clinical Impression Statement Did well with initial exercises and instruction to avoid pushing stretches to the point of pain.  Plan to progress slowly with exercise program.   Rehab Potential Good   PT Frequency 2x / week   PT Duration 4 weeks   PT Treatment/Interventions ADLs/Self Care Home Management;Moist Heat;Cryotherapy;Therapeutic exercise;Patient/family education;Manual techniques;Scar mobilization;Passive range of motion;Taping;Electrical Stimulation   PT Next Visit Plan Review HEP given this session.  Add initial core strengthening exercise.  Assess benefit of electrical stim and tolerance of HEP re: pain level.   PT Home Exercise Plan see instruction section   Consulted and Agree with Plan of Care Patient      Patient will benefit from skilled therapeutic intervention in order to improve the following deficits and impairments:  Pain, Decreased range of motion, Decreased strength  Visit Diagnosis: Bilateral low back pain, unspecified chronicity, with sciatica presence unspecified  Other symptoms and signs involving the musculoskeletal system       G-Codes - Sep 04, 2016 1403    Functional Assessment Tool Used (Outpatient Only) clinical judgement   Functional Limitation Changing and maintaining body position  related to back flexibility   Changing and Maintaining Body Position Current Status (Y0737) At least 20 percent but less than 40 percent impaired, limited or restricted   Changing and Maintaining Body Position Goal Status (T0626) At least 1 percent but less than 20 percent  impaired, limited or restricted      Problem List Patient Active Problem List   Diagnosis Date Noted  . Abnormal uterine bleeding (AUB) 03/14/2016  . Iron deficiency anemia due to chronic blood loss 05/25/2015  . 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 Nausea with vomiting 05/11/2015  . Healed or old pulmonary embolism 03/31/2015  . Genetic testing 01/23/2015  . Insomnia 06/30/2014  . Neutropenia ([place]) 02/24/2014  . Neuropathy due to chemotherapeutic drug ([place]) 12/18/2013  . Hypoalbuminemia 12/18/2013  . Malfunction of device 12/18/2013  .  Leg edema 12/18/2013  . Neoplasm related pain 12/18/2013  . Other pancytopenia ([place]) 12/13/2013  . Hyponatremia 12/12/2013  . Hypokalemia 12/12/2013  . Weight loss 12/04/2013  . PEG tube malfunction ([place]) 12/04/2013  . Dehiscence of incision 12/04/2013  . Abnormal loss of weight 12/04/2013  . Family history of cancer   . Cancer of antrum of stomach ([place]) 11/07/2013  . 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 Palliative care encounter 11/04/2013  . Nausea & vomiting 11/04/2013  . Abdominal pain, left lower quadrant 11/04/2013  . N&V (nausea and vomiting) 11/04/2013  . Acute kidney injury ([place]) 11/03/2013  . Chronic low back pain 10/31/2013  . Pain of left upper arm 10/28/2013  . 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 Constipation, chronic 10/28/2013  . Protein-calorie malnutrition, severe ([place]) 10/23/2013  . Anemia, unspecified 10/23/2013  . Intractable nausea and vomiting 10/22/2013  . Intractable vomiting 10/22/2013  . Back pain 10/10/2013  . Malnutrition of moderate degree ([place]) 09/19/2013  . 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 Carcinoma of stomach ([place]) 09/18/2013  . Antral ulcer 09/16/2013  . Migraine headache 09/14/2013  . PTSD (post-traumatic stress disorder) 08/19/2013  . Left knee injury 07/30/2013  . Low back pain 07/30/2013  . Mitral  regurgitation 02/15/2013  . Chest pain 01/23/2012  . Essential hypertension 01/23/2012  . Murmur 01/23/2012    SALISBURY,DONNA 08/31/2016, 5:22 PM  [place], [place], [address] Phone: [phone]   Fax:  [phone]  Name: JERICHO CIESLIK MRN: [id number] Date of Birth: 06/06/1965  Serafina Royals, PT 08/31/16 5:23 PM

## 2016-08-31 NOTE — Patient Instructions (Addendum)
Supine Knee-to-Chest, Unilateral    Lie on back, hands clasped behind one knee. Pull knee in toward chest until a comfortable stretch is felt in lower back and buttocks. Hold _10__ seconds.  Repeat _5__ times per session. Do _2__ sessions per day.  Copyright  VHI. All rights reserved.  Supine With Rotation    Lie, back flat, legs bent, feet together. Rotate knees to one side just far enough to feel a GENTLE stretch. Hold _10__ seconds. Repeat to other side. Repeat __5_ times per session. Do __2_ sessions per day.  Copyright  VHI. All rights reserved.  Supine    Lie on back with one knee bent, foot flat on floor. Hook strap around ball of other foot. Pull toes and forefoot toward knee, extend heel. Relax foot ONLY (knee remains straight). Hold __30_ seconds. Repeat _2__ times per session. Do _2__ sessions per day.  Copyright  VHI. All rights reserved.

## 2016-09-06 ENCOUNTER — Ambulatory Visit: Payer: Medicare Other | Admitting: Physical Therapy

## 2016-09-06 DIAGNOSIS — M545 Low back pain: Secondary | ICD-10-CM | POA: Diagnosis not present

## 2016-09-06 DIAGNOSIS — R29898 Other symptoms and signs involving the musculoskeletal system: Secondary | ICD-10-CM

## 2016-09-06 NOTE — Therapy (Signed)
[place], [place], [address] Phone: [phone]   Fax:  [phone]  Physical Therapy Treatment  Patient Details  Name: Paula [name] MRN: [id number] Date of Birth: 09/11/1965 Referring Provider: Dr. Eppie Gibson  Encounter Date: 09/06/2016      PT End of Session - 09/06/16 1721    Visit Number 3   Number of Visits 9   Date for PT Re-Evaluation 10/07/16   PT Start Time 1520   PT Stop Time 1623   PT Time Calculation (min) 63 min   Activity Tolerance Patient tolerated treatment well   Behavior During Therapy [facility] for tasks assessed/performed      Past Medical History:  Diagnosis Date  . Costochondritis   . Esophageal reflux    On omeprazole  . Family history of cancer   . Headache(784.0) 2011   Migraines  . Heart murmur   . Hypertension    started around 2011  . Iron deficiency anemia due to chronic blood loss 05/25/2015  . 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 On antineoplastic chemotherapy    5-FU and leucovorin  . Ovarian cyst   . Pneumonia   . Pulmonary emboli ([place])   . 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 ESOPHAGOGASTRODUODENOSCOPY N/A 09/16/2013   Procedure: ESOPHAGOGASTRODUODENOSCOPY (EGD);  Surgeon: Wonda Horner, MD;  Location: [facility];  Service: Endoscopy;  Laterality: N/A;  . LAPAROSCOPY N/A 09/20/2013   Procedure: DIAGNOSTIC LAPAROSCOPY,DISTAL GASTRECTOMY AND FEEDING GASTROJEJUNOSTOMY;  Surgeon: [name], MD;  Location[facility];  Service: General;  Laterality: N/A;  . OOPHORECTOMY     Around 1985 left ovary removal  . PORTACATH PLACEMENT Left 11/18/2013   Procedure: INSERTION PORT-A-CATH;  Surgeon: [name], MD;  [facility];  Service: General;  Laterality: Left;  . TONSILLECTOMY     Around 1994  . TUBAL LIGATION      There were no vitals filed for this visit.      Subjective Assessment -  09/06/16 1522    Subjective The electrical stimulation last time felt helpful.  Hasn't been doing exercises as often as she should.  Says she has been a little depressed becauseof her teeth.    Currently in Pain? Yes   Pain Score 4    Pain Location Back   Pain Orientation Right;Lower   Pain Descriptors / Indicators Aching   Aggravating Factors  sitting for a long time or doing a whole lot of movement, driving   Pain Relieving Factors ibuprofen   Multiple Pain Sites Yes   Pain Score 6   Pain Location Back   Pain Orientation Upper   Pain Descriptors / Indicators Heaviness;Tightness   Aggravating Factors  nothing in particular   Pain Relieving Factors heating pad helps                         [facility] Adult PT Treatment/Exercise - 09/06/16 0001      Lumbar Exercises: Stretches   Passive Hamstring Stretch 30 seconds;1 rep  each side, supine with strap   Single Knee to Chest Stretch 5 reps;10 seconds  right and left   Lower Trunk Rotation 10 seconds;5 reps     Moist Heat Therapy   Number Minutes Moist Heat 20 Minutes   Moist Heat Location Lumbar Spine;Cervical  in prone     Electrical Stimulation   Electrical Stimulation Location low back  Academic librarian Parameters 80-150 Hz   Electrical Stimulation Goals Pain     Manual Therapy   Manual Therapy Myofascial release;Passive ROM;Other (comment)   Myofascial Release O-A release in supine   Passive ROM to neck into sidebend to pt. tolerance   Other Manual Therapy In sitting, soft tissue work to neck and upper back with [place] - 08/30/16 1305      CC Long Term Goal  #1   Title Pt. will report at least 50% decrease in lower back pain   Time 4   Period Weeks   Status New     CC Long Term Goal  #2   Title Pt. will be independent in home exercise program for trunk flexibility and strengthening    Time 4   Period Weeks   Status New     CC Long Term Goal  #3   Title Pt. will show low back AROM not limited by more than 15% any direction for improved ADLs.   Time 4   Period Weeks   Status New            Plan - 09/06/16 1722    Clinical Impression Statement Pt. has done exercise only about once every other day to date.  She reported upper back heaviness and tightness today as well.  Reviewed HEP given last time; soft tissue work and stretching added for neck and upper back pain relief.  Pt. reported it did feel much better.     Rehab Potential Good   PT Frequency 2x / week   PT Duration 4 weeks   PT Treatment/Interventions ADLs/Self Care Home Management;Moist Heat;Cryotherapy;Therapeutic exercise;Patient/family education;Manual techniques;Scar mobilization;Passive range of motion;Taping;Electrical Stimulation   PT Next Visit Plan Try spinal decompression exercises. Add neck tension stretches, shoulder rolls to HEP.  Begin core strengthening as time permits.  Continue heat and electrical stim if she doesn't have her cardiac monitor on.   PT Home Exercise Plan low back stretches   Consulted and Agree with Plan of Care Patient      Patient will benefit from skilled therapeutic intervention in order to improve the following deficits and impairments:  Pain, Decreased range of motion, Decreased strength  Visit Diagnosis: Bilateral low back pain, unspecified chronicity, with sciatica presence unspecified - Plan: PT plan of care cert/re-cert  Other symptoms and signs involving the musculoskeletal system - Plan: PT plan of care cert/re-cert     Problem List Patient Active Problem List   Diagnosis Date Noted  . Abnormal uterine bleeding (AUB) 03/14/2016  . Iron deficiency anemia due to chronic blood loss 05/25/2015  . 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 Nausea with vomiting 05/11/2015  . Healed or old pulmonary embolism 03/31/2015  . Genetic testing 01/23/2015  . Insomnia  06/30/2014  . Neutropenia ([place]) 02/24/2014  . Neuropathy due to chemotherapeutic drug ([place]) 12/18/2013  . Hypoalbuminemia 12/18/2013  . Malfunction of device 12/18/2013  . Leg edema 12/18/2013  . Neoplasm related pain 12/18/2013  . Other pancytopenia ([place]) 12/13/2013  . Hyponatremia 12/12/2013  . Hypokalemia 12/12/2013  . Weight loss 12/04/2013  . PEG tube malfunction ([place]) 12/04/2013  . Dehiscence of incision 12/04/2013  . Abnormal loss of weight 12/04/2013  . Family history of cancer   . Cancer of antrum of stomach ([place]) 11/07/2013  .  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 Palliative care encounter 11/04/2013  . Nausea & vomiting 11/04/2013  . Abdominal pain, left lower quadrant 11/04/2013  . N&V (nausea and vomiting) 11/04/2013  . Acute kidney injury ([place]) 11/03/2013  . Chronic low back pain 10/31/2013  . Pain of left upper arm 10/28/2013  . 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 Constipation, chronic 10/28/2013  . Protein-calorie malnutrition, severe ([place]) 10/23/2013  . Anemia, unspecified 10/23/2013  . Intractable nausea and vomiting 10/22/2013  . Intractable vomiting 10/22/2013  . Back pain 10/10/2013  . Malnutrition of moderate degree ([place]) 09/19/2013  . 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 Carcinoma of stomach ([place]) 09/18/2013  . Antral ulcer 09/16/2013  . Migraine headache 09/14/2013  . PTSD (post-traumatic stress disorder) 08/19/2013  . Left knee injury 07/30/2013  . Low back pain 07/30/2013  . Mitral regurgitation 02/15/2013  . Chest pain 01/23/2012  . Essential hypertension 01/23/2012  . Murmur 01/23/2012    Khy Pitre 09/06/2016, 5:30 PM  [place], [place], [address] Phone: [phone]   Fax:  [phone]  Name: JOURDAN MALDONADO MRN: [id number] Date of Birth:  07/05/1965  Serafina Royals, PT 09/06/16 5:30 PM

## 2016-09-07 ENCOUNTER — Ambulatory Visit (INDEPENDENT_AMBULATORY_CARE_PROVIDER_SITE_OTHER): Payer: Medicare Other | Admitting: General Practice

## 2016-09-07 DIAGNOSIS — Z3042 Encounter for surveillance of injectable contraceptive: Secondary | ICD-10-CM | POA: Diagnosis not present

## 2016-09-07 DIAGNOSIS — N938 Other specified abnormal uterine and vaginal bleeding: Secondary | ICD-10-CM

## 2016-09-08 ENCOUNTER — Ambulatory Visit: Payer: Medicare Other | Admitting: Physical Therapy

## 2016-09-08 DIAGNOSIS — M545 Low back pain: Secondary | ICD-10-CM | POA: Diagnosis not present

## 2016-09-08 DIAGNOSIS — R29898 Other symptoms and signs involving the musculoskeletal system: Secondary | ICD-10-CM

## 2016-09-08 NOTE — Therapy (Signed)
[place], [place], [address] Phone: [phone]   Fax:  [phone]  Physical Therapy Treatment  Patient Details  Name: Paula [name] MRN: [id number] Date of Birth: 1965-12-08 Referring Provider: Dr. Eppie Gibson  Encounter Date: 09/08/2016      PT End of Session - 09/08/16 1231    Visit Number 4   Number of Visits 9   Date for PT Re-Evaluation 10/07/16   PT Start Time 4580   PT Stop Time 1100   PT Time Calculation (min) 45 min   Activity Tolerance Patient tolerated treatment well   Behavior During Therapy [facility] for tasks assessed/performed      Past Medical History:  Diagnosis Date  . Costochondritis   . Esophageal reflux    On omeprazole  . Family history of cancer   . Headache(784.0) 2011   Migraines  . Heart murmur   . Hypertension    started around 2011  . Iron deficiency anemia due to chronic blood loss 05/25/2015  . 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 On antineoplastic chemotherapy    5-FU and leucovorin  . Ovarian cyst   . Pneumonia   . Pulmonary emboli ([place])   . Stomach cancer ([place]) dx'd 08/2013    Past Surgical History:  Procedure Laterality Date  . ESOPHAGOGASTRODUODENOSCOPY N/A 09/16/2013   Procedure: ESOPHAGOGASTRODUODENOSCOPY (EGD);  Surgeon: Wonda Horner, MD;  Location: [facility];  Service: Endoscopy;  Laterality: N/A;  . LAPAROSCOPY N/A 09/20/2013   Procedure: DIAGNOSTIC LAPAROSCOPY,DISTAL GASTRECTOMY AND FEEDING GASTROJEJUNOSTOMY;  Surgeon: [name], MD;  Location: [facility];  Service: General;  Laterality: N/A;  . OOPHORECTOMY     Around 1985 left ovary removal  . PORTACATH PLACEMENT Left 11/18/2013   Procedure: INSERTION PORT-A-CATH;  Surgeon: [name], MD;  Location: [facility];  Service: General;  Laterality: Left;  . TONSILLECTOMY     Around 1994  . TUBAL LIGATION      There were no vitals filed for this visit.      Subjective Assessment -  09/08/16 1018    Subjective Pt reports she is feeling heavines in her upper body and shoulders and was hoping she would get the soft tissue work to this area again today as she got relief from that afer last session.    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 Had partial resection of stomach 09/22/13, then adjuvant chemo (finished January 2016) and radiation for 8 weeks, completed 06/17/14. She had a CT scan of the abdomen and pelvis on 06/20/16 which showed no evidence of active disease. Continued follow-ups with Dr. Isidore Moos and Dr. Marin Olp.  Currently wearing a cardiac monitor because sometimes she feels fluttery, faint and lightheaded.  Has a heart murmur and a leaky valve.  Hyperlipidemia; recently tried a statin but came off it because it made her muscles sore.  Has had blood drained from left knee in the past about two+ years ago. Has been getting steroid shots for bilat. hip bursitis   Patient Stated Goals try to resolve the issue with my back pain or do some exercises that would ease the pain   Currently in Pain? Yes   Pain Score 5    Pain Location Back   Pain Orientation Upper;Lower   Pain Descriptors / Indicators Aching;Throbbing   Pain Type Chronic pain   Pain Radiating Towards no   Pain Onset More than a month ago   Pain Frequency Constant   Aggravating Factors  feels stiff in the mornin    Pain Relieving Factors medicine                          [facility] Adult PT Treatment/Exercise - 09/08/16 0001      Neck Exercises: Seated   Other Seated Exercise neck flexion, extension, rotation and lateral flexion, upper thoracic rotation, and trunk lateral felxion and shoulder rolls in sitting   decreased thoracic rotation and lateral flexion      Lumbar Exercises: Supine   Other Supine Lumbar Exercises Meeks decompression exercise with folded towel under head,    shoulders relaxed from 3 fingers to 1 finger off mat      Moist Heat Therapy   Number  Minutes Moist Heat 10 Minutes   Moist Heat Location Cervical  in sidelying, then to lumbar for 10 minutes in sidelying      Manual Therapy   Manual Therapy Soft tissue mobilization;Manual Traction   Soft tissue mobilization in sidelying and supine after heat pack with biotone to upper and midback and neck   prolonged pressure on several trigger points in traps   Myofascial Release O-A release with gentle cervical traction n supine   Manual Traction gently to cervical muscles                 PT Education - 09/08/16 1038    Education provided Yes   Education Details Meeks decompression exercise    Person(s) Educated Patient   Methods Explanation;Demonstration;Handout   Comprehension Verbalized understanding;Returned demonstration                [facility] Goals - 08/30/16 1305      CC Long Term Goal  #1   Title Pt. will report at least 50% decrease in lower back pain   Time 4   Period Weeks   Status New     CC Long Term Goal  #2   Title Pt. will be independent in home exercise program for trunk flexibility and strengthening   Time 4   Period Weeks   Status New     CC Long Term Goal  #3   Title Pt. will show low back AROM not limited by more than 15% any direction for improved ADLs.   Time 4   Period Weeks   Status New            Plan - 09/08/16 1232    Clinical Impression Statement Treatment today focused on neck as patient stated she was having most of her discomfort in this area today.  She said she felt better after treatment and will work on shoulder rolls and meeks deompression exercise. Feel she will benefit from supine scapular series to increase interscapular strength as well    Rehab Potential Good   PT Frequency 2x / week   PT Duration 4 weeks   PT Next Visit Plan Assess if she has been doing spinal decompression exercises and shoulder rolls to HEP. Add neck tension stretches, and try supine scapular series with yellow theraband. Try  thoracic mobilty exercise.  Begin core strengthening as time permits.  Continue heat and electrical stim if she doesn't have her cardiac monitor on.   Consulted and Agree with Plan of Care Patient      Patient will benefit from skilled therapeutic intervention in order to improve the following deficits and impairments:  Pain, Decreased range of motion, Decreased strength  Visit Diagnosis: Bilateral low  back pain, unspecified chronicity, with sciatica presence unspecified  Other symptoms and signs involving the musculoskeletal system     Problem List Patient Active Problem List   Diagnosis Date Noted  . Abnormal uterine bleeding (AUB) 03/14/2016  . Iron deficiency anemia due to chronic blood loss 05/25/2015  . 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 Nausea with vomiting 05/11/2015  . Healed or old pulmonary embolism 03/31/2015  . Genetic testing 01/23/2015  . Insomnia 06/30/2014  . Neutropenia ([place]) 02/24/2014  . Neuropathy due to chemotherapeutic drug ([place]) 12/18/2013  . Hypoalbuminemia 12/18/2013  . Malfunction of device 12/18/2013  . Leg edema 12/18/2013  . Neoplasm related pain 12/18/2013  . Other pancytopenia ([place]) 12/13/2013  . Hyponatremia 12/12/2013  . Hypokalemia 12/12/2013  . Weight loss 12/04/2013  . PEG tube malfunction ([place]) 12/04/2013  . Dehiscence of incision 12/04/2013  . Abnormal loss of weight 12/04/2013  . Family history of cancer   . Cancer of antrum of stomach ([place]) 11/07/2013  . 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 Palliative care encounter 11/04/2013  . Nausea & vomiting 11/04/2013  . Abdominal pain, left lower quadrant 11/04/2013  . N&V (nausea and vomiting) 11/04/2013  . Acute kidney injury ([place]) 11/03/2013  . Chronic low back pain 10/31/2013  . Pain of left upper arm 10/28/2013  . 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 Constipation, chronic 10/28/2013  . Protein-calorie malnutrition, severe ([place])  10/23/2013  . Anemia, unspecified 10/23/2013  . Intractable nausea and vomiting 10/22/2013  . Intractable vomiting 10/22/2013  . Back pain 10/10/2013  . Malnutrition of moderate degree ([place]) 09/19/2013  . 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 Carcinoma of stomach ([place]) 09/18/2013  . Antral ulcer 09/16/2013  . Migraine headache 09/14/2013  . PTSD (post-traumatic stress disorder) 08/19/2013  . Left knee injury 07/30/2013  . Low back pain 07/30/2013  . Mitral regurgitation 02/15/2013  . Chest pain 01/23/2012  . Essential hypertension 01/23/2012  . Murmur 01/23/2012   Donato Heinz. Owens Shark PT  Norwood Levo 09/08/2016, 12:36 PM  [place], [place], [address] Phone: [phone]   Fax:  [phone]  Name: SHAELA BOER MRN: [id number] Date of Birth: April 27, 1965

## 2016-09-08 NOTE — Patient Instructions (Signed)

## 2016-09-13 ENCOUNTER — Ambulatory Visit: Payer: Medicare Other | Admitting: Physical Therapy

## 2016-09-15 ENCOUNTER — Ambulatory Visit: Payer: Medicare Other | Admitting: Physical Therapy

## 2016-09-15 DIAGNOSIS — M545 Low back pain: Secondary | ICD-10-CM

## 2016-09-15 DIAGNOSIS — R29898 Other symptoms and signs involving the musculoskeletal system: Secondary | ICD-10-CM

## 2016-09-15 NOTE — Patient Instructions (Signed)

## 2016-09-15 NOTE — Therapy (Signed)
[place], [place], [address] Phone: [phone]   Fax:  [phone]  Physical Therapy Treatment  Patient Details  Name: Paula [name] MRN: [id number] Date of Birth: 07/05/1965 Referring Provider: Dr. Eppie Gibson  Encounter Date: 09/15/2016      PT End of Session - 09/15/16 1203    Visit Number 5   Number of Visits 9   Date for PT Re-Evaluation 10/07/16   PT Start Time 1020   PT Stop Time 1100   PT Time Calculation (min) 40 min      Past Medical History:  Diagnosis Date  . Costochondritis   . Esophageal reflux    On omeprazole  . Family history of cancer   . Headache(784.0) 2011   Migraines  . Heart murmur   . Hypertension    started around 2011  . Iron deficiency anemia due to chronic blood loss 05/25/2015  . 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 On antineoplastic chemotherapy    5-FU and leucovorin  . Ovarian cyst   . Pneumonia   . Pulmonary emboli ([place])   . Stomach cancer ([place]) dx'd 08/2013    Past Surgical History:  Procedure Laterality Date  . ESOPHAGOGASTRODUODENOSCOPY N/A 09/16/2013   Procedure: ESOPHAGOGASTRODUODENOSCOPY (EGD);  Surgeon: Wonda Horner, MD;  Location: [facility];  Service: Endoscopy;  Laterality: N/A;  . LAPAROSCOPY N/A 09/20/2013   Procedure: DIAGNOSTIC LAPAROSCOPY,DISTAL GASTRECTOMY AND FEEDING GASTROJEJUNOSTOMY;  Surgeon: [name], MD;  Location: [facility];  Service: General;  Laterality: N/A;  . OOPHORECTOMY     Around 1985 left ovary removal  . PORTACATH PLACEMENT Left 11/18/2013   Procedure: INSERTION PORT-A-CATH;  Surgeon: [name], MD;  Location: [facility];  Service: General;  Laterality: Left;  . TONSILLECTOMY     Around 1994  . TUBAL LIGATION      There were no vitals filed for this visit.      Subjective Assessment - 09/15/16 1027    Subjective "I feel refreshed"   Pt says she had some problems with her neck after some dental work, but  that resolved.    Patient Stated Goals try to resolve the issue with my back pain or do some exercises that would ease the pain   Currently in Pain? Yes   Pain Score 8    Pain Location Back   Pain Orientation Lower   Pain Descriptors / Indicators Aching   Pain Type Chronic pain   Pain Radiating Towards no    Pain Onset More than a month ago   Pain Frequency Constant   Aggravating Factors  worse in the morning when she gets up.    Pain Relieving Factors Meeks exercise leg lenthener seems to help                          [facility] Adult PT Treatment/Exercise - 09/15/16 0001      Lumbar Exercises: Stretches   Single Knee to Chest Stretch 5 reps;10 seconds  right and left   Pelvic Tilt 5 reps     Lumbar Exercises: Supine   Clam 10 reps   Other Supine Lumbar Exercises abd set with lower core activation, one knee to tabletop then other then first one down then second down    Other Supine Lumbar Exercises alternating knee to chest      Moist Heat Therapy   Number Minutes Moist Heat 10 Minutes  10 to each area  Moist Heat Location Cervical;Lumbar Spine     Manual Therapy   Soft tissue mobilization in supine to neck and low back                 PT Education - 09/15/16 1202    Education provided Yes   Education Details transverse abdominal series    Person(s) Educated Patient   Methods Explanation;Demonstration;Handout   Comprehension Verbalized understanding;Returned demonstration                [facility] Goals - 08/30/16 1305      CC Long Term Goal  #1   Title Pt. will report at least 50% decrease in lower back pain   Time 4   Period Weeks   Status New     CC Long Term Goal  #2   Title Pt. will be independent in home exercise program for trunk flexibility and strengthening   Time 4   Period Weeks   Status New     CC Long Term Goal  #3   Title Pt. will show low back AROM not limited by more than 15% any direction for improved  ADLs.   Time 4   Period Weeks   Status New            Plan - 09/15/16 1203    Clinical Impression Statement Pt still c/o low back pain so focued on soft tissue to this area as well as to cervical area and exercise for stretching low back and strengthening the abdominals. Pt says she felt better when she left.  She reports she is doing her exercise at home    Clinical Impairments Affecting Rehab Potential previous abdominal surgery    PT Frequency 2x / week   PT Duration 4 weeks   PT Next Visit Plan Assess if she has been doing abdominal strengtening at home  Add neck tension stretches, and try supine scapular series with yellow theraband. Try thoracic mobilty exercise.    Continue heat and electrical stim if she doesn't have her cardiac monitor on.   Consulted and Agree with Plan of Care Patient      Patient will benefit from skilled therapeutic intervention in order to improve the following deficits and impairments:  Pain, Decreased range of motion, Decreased strength  Visit Diagnosis: Bilateral low back pain, unspecified chronicity, with sciatica presence unspecified  Other symptoms and signs involving the musculoskeletal system     Problem List Patient Active Problem List   Diagnosis Date Noted  . Abnormal uterine bleeding (AUB) 03/14/2016  . Iron deficiency anemia due to chronic blood loss 05/25/2015  . 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 Nausea with vomiting 05/11/2015  . Healed or old pulmonary embolism 03/31/2015  . Genetic testing 01/23/2015  . Insomnia 06/30/2014  . Neutropenia ([place]) 02/24/2014  . Neuropathy due to chemotherapeutic drug ([place]) 12/18/2013  . Hypoalbuminemia 12/18/2013  . Malfunction of device 12/18/2013  . Leg edema 12/18/2013  . Neoplasm related pain 12/18/2013  . Other pancytopenia ([place]) 12/13/2013  . Hyponatremia 12/12/2013  . Hypokalemia 12/12/2013  . Weight loss 12/04/2013  . PEG tube malfunction ([place]) 12/04/2013  . Dehiscence of  incision 12/04/2013  . Abnormal loss of weight 12/04/2013  . Family history of cancer   . Cancer of antrum of stomach ([place]) 11/07/2013  . 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 Palliative care encounter 11/04/2013  . Nausea & vomiting 11/04/2013  . Abdominal pain,  left lower quadrant 11/04/2013  . N&V (nausea and vomiting) 11/04/2013  . Acute kidney injury ([place]) 11/03/2013  . Chronic low back pain 10/31/2013  . Pain of left upper arm 10/28/2013  . 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 Constipation, chronic 10/28/2013  . Protein-calorie malnutrition, severe ([place]) 10/23/2013  . Anemia, unspecified 10/23/2013  . Intractable nausea and vomiting 10/22/2013  . Intractable vomiting 10/22/2013  . Back pain 10/10/2013  . Malnutrition of moderate degree ([place]) 09/19/2013  . 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 Carcinoma of stomach ([place]) 09/18/2013  . Antral ulcer 09/16/2013  . Migraine headache 09/14/2013  . PTSD (post-traumatic stress disorder) 08/19/2013  . Left knee injury 07/30/2013  . Low back pain 07/30/2013  . Mitral regurgitation 02/15/2013  . Chest pain 01/23/2012  . Essential hypertension 01/23/2012  . Murmur 01/23/2012   Donato Heinz. Owens Shark PT  Norwood Levo 09/15/2016, 12:06 PM  [place], [place], [address] Phone: [phone]   Fax:  [phone]  Name: Paula [name] MRN: [id number] Date of Birth: Jun 08, 1965

## 2016-09-19 ENCOUNTER — Other Ambulatory Visit: Payer: Self-pay | Admitting: *Deleted

## 2016-09-19 DIAGNOSIS — C169 Malignant neoplasm of stomach, unspecified: Secondary | ICD-10-CM

## 2016-09-19 MED ORDER — HYDROCODONE-ACETAMINOPHEN 5-325 MG PO TABS: 1.0000 | ORAL_TABLET | Freq: Four times a day (QID) | ORAL | 0 refills | Status: DC | PRN

## 2016-09-20 ENCOUNTER — Ambulatory Visit: Payer: Medicare Other

## 2016-09-20 DIAGNOSIS — M545 Low back pain: Secondary | ICD-10-CM | POA: Diagnosis not present

## 2016-09-20 DIAGNOSIS — R29898 Other symptoms and signs involving the musculoskeletal system: Secondary | ICD-10-CM

## 2016-09-20 NOTE — Therapy (Signed)
[place], [place], [address] Phone: [phone]   Fax:  [phone]  Physical Therapy Treatment  Patient Details  Name: Paula [name] MRN: [id number] Date of Birth: Nov 02, 1965 Referring Provider: Dr. Eppie Gibson  Encounter Date: 09/20/2016      PT End of Session - 09/20/16 1201    Visit Number 6   Number of Visits 9   Date for PT Re-Evaluation 10/07/16   PT Start Time 1110   PT Stop Time 1210   PT Time Calculation (min) 60 min   Activity Tolerance Patient tolerated treatment well   Behavior During Therapy [facility] for tasks assessed/performed      Past Medical History:  Diagnosis Date  . Costochondritis   . Esophageal reflux    On omeprazole  . Family history of cancer   . Headache(784.0) 2011   Migraines  . Heart murmur   . Hypertension    started around 2011  . Iron deficiency anemia due to chronic blood loss 05/25/2015  . 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 On antineoplastic chemotherapy    5-FU and leucovorin  . Ovarian cyst   . Pneumonia   . Pulmonary emboli ([place])   . Stomach cancer ([place]) dx'd 08/2013    Past Surgical History:  Procedure Laterality Date  . ESOPHAGOGASTRODUODENOSCOPY N/A 09/16/2013   Procedure: ESOPHAGOGASTRODUODENOSCOPY (EGD);  Surgeon: Wonda Horner, MD;  Location: [facility];  Service: Endoscopy;  Laterality: N/A;  . LAPAROSCOPY N/A 09/20/2013   Procedure: DIAGNOSTIC LAPAROSCOPY,DISTAL GASTRECTOMY AND FEEDING GASTROJEJUNOSTOMY;  Surgeon: [name], MD;  Location: [facility];  Service: General;  Laterality: N/A;  . OOPHORECTOMY     Around 1985 left ovary removal  . PORTACATH PLACEMENT Left 11/18/2013   Procedure: INSERTION PORT-A-CATH;  Surgeon: [name], MD;  Location: [facility];  Service: General;  Laterality: Left;  . TONSILLECTOMY     Around 1994  . TUBAL LIGATION      There were no vitals filed for this visit.      Subjective Assessment -  09/20/16 1117    Subjective I started taking Cymbalta Friday and I'vebeen sick each day the next day so I didn't take it last night bc I was coming here. My back was feeling better up until these last few days when I started taking the Cymbalta and it's been getting me sick (vomitting). So I'm going to call my doctor and let her know today.    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 Had partial resection of stomach 09/22/13, then adjuvant chemo (finished January 2016) and radiation for 8 weeks, completed 06/17/14. She had a CT scan of the abdomen and pelvis on 06/20/16 which showed no evidence of active disease. Continued follow-ups with Dr. Isidore Moos and Dr. Marin Olp.  Currently wearing a cardiac monitor because sometimes she feels fluttery, faint and lightheaded.  Has a heart murmur and a leaky valve.  Hyperlipidemia; recently tried a statin but came off it because it made her muscles sore.  Has had blood drained from left knee in the past about two+ years ago. Has been getting steroid shots for bilat. hip bursitis   Patient Stated Goals try to resolve the issue with my back pain or do some exercises that would ease the pain                         [facility] Adult PT Treatment/Exercise - 09/20/16 0001  Lumbar Exercises: Stretches   Passive Hamstring Stretch 3 reps;20 seconds  right and left using towel over ball of foot   Single Knee to Chest Stretch 3 reps;20 seconds  right and left   Piriformis Stretch 2 reps;20 seconds  Right and left using towel   Piriformis Stretch Limitations Pt had pain with this on Rt side and struggled overall with doing this in supine so also had pt work on this in sitting which she reported was much more comfortable.      Lumbar Exercises: Supine   Other Supine Lumbar Exercises Instruction of posterior pelvic tilt as pt had been doing them incorrectly at home (had been doing anterior pelvic tilt) and after max tactile and VC she was  able to return correct demonstration.     Moist Heat Therapy   Number Minutes Moist Heat 15 Minutes  to upper and lower back   Moist Heat Location Cervical;Lumbar Spine     Electrical Stimulation   Electrical Stimulation Location upper and low back   Electrical Stimulation Action IFC   Electrical Stimulation Parameters 80-150 Hz x15 minutes   Electrical Stimulation Goals Pain                        Long Term Clinic Goals - 08/30/16 1305      CC Long Term Goal  #1   Title Pt. will report at least 50% decrease in lower back pain   Time 4   Period Weeks   Status New     CC Long Term Goal  #2   Title Pt. will be independent in home exercise program for trunk flexibility and strengthening   Time 4   Period Weeks   Status New     CC Long Term Goal  #3   Title Pt. will show low back AROM not limited by more than 15% any direction for improved ADLs.   Time 4   Period Weeks   Status New            Plan - 09/20/16 1202    Clinical Impression Statement Pt hasn;t been feeling well since here last with nauseous and vomitting daily since Saturday which she contributes to starting Cymbalta on Friday night. She did not take it last night and has not been sick at all today so she will be calling her doctor who prescribed it to her to let them know of her symtpoms. Reviewed pelvic tilt today which pt was really struggling with technique so spent alot of time working on this. Pt was able to demonstrate correct technique of this by end of session and was able to verbalize feeling good stertch in low back and to look for this when doing at home. Pt reported relief felt with bil hip stretches today but they were more comfortable in sitting. Finished with heat and eletrical stimulation to back as pt is no longer wearing cardiac monitor and had reported relief from this last time done.    Rehab Potential Good   Clinical Impairments Affecting Rehab Potential previous abdominal  surgery    PT Frequency 2x / week   PT Duration 4 weeks   PT Treatment/Interventions ADLs/Self Care Home Management;Moist Heat;Cryotherapy;Therapeutic exercise;Patient/family education;Manual techniques;Scar mobilization;Passive range of motion;Taping;Electrical Stimulation   PT Next Visit Plan Assess if she has been doing abdominal strengtening at home  Add neck tension stretches, and try supine scapular series with yellow theraband. Try thoracic mobilty exercise.  Continue heat and electrical stim.   Consulted and Agree with Plan of Care Patient      Patient will benefit from skilled therapeutic intervention in order to improve the following deficits and impairments:  Pain, Decreased range of motion, Decreased strength  Visit Diagnosis: Bilateral low back pain, unspecified chronicity, with sciatica presence unspecified  Other symptoms and signs involving the musculoskeletal system     Problem List Patient Active Problem List   Diagnosis Date Noted  . Abnormal uterine bleeding (AUB) 03/14/2016  . Iron deficiency anemia due to chronic blood loss 05/25/2015  . 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 Nausea with vomiting 05/11/2015  . Healed or old pulmonary embolism 03/31/2015  . Genetic testing 01/23/2015  . Insomnia 06/30/2014  . Neutropenia ([place]) 02/24/2014  . Neuropathy due to chemotherapeutic drug ([place]) 12/18/2013  . Hypoalbuminemia 12/18/2013  . Malfunction of device 12/18/2013  . Leg edema 12/18/2013  . Neoplasm related pain 12/18/2013  . Other pancytopenia ([place]) 12/13/2013  . Hyponatremia 12/12/2013  . Hypokalemia 12/12/2013  . Weight loss 12/04/2013  . PEG tube malfunction ([place]) 12/04/2013  . Dehiscence of incision 12/04/2013  . Abnormal loss of weight 12/04/2013  . Family history of cancer   . Cancer of antrum of stomach ([place]) 11/07/2013  . 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 Palliative care encounter  11/04/2013  . Nausea & vomiting 11/04/2013  . Abdominal pain, left lower quadrant 11/04/2013  . N&V (nausea and vomiting) 11/04/2013  . Acute kidney injury ([place]) 11/03/2013  . Chronic low back pain 10/31/2013  . Pain of left upper arm 10/28/2013  . 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 Constipation, chronic 10/28/2013  . Protein-calorie malnutrition, severe ([place]) 10/23/2013  . Anemia, unspecified 10/23/2013  . Intractable nausea and vomiting 10/22/2013  . Intractable vomiting 10/22/2013  . Back pain 10/10/2013  . Malnutrition of moderate degree ([place]) 09/19/2013  . 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 Carcinoma of stomach ([place]) 09/18/2013  . Antral ulcer 09/16/2013  . Migraine headache 09/14/2013  . PTSD (post-traumatic stress disorder) 08/19/2013  . Left knee injury 07/30/2013  . Low back pain 07/30/2013  . Mitral regurgitation 02/15/2013  . Chest pain 01/23/2012  . Essential hypertension 01/23/2012  . Murmur 01/23/2012    Otelia Limes, PTA 09/20/2016, 12:09 PM  [place], [place], [address] Phone: [phone]   Fax:  [phone]  Name: Paula [name] MRN: [id number] Date of Birth: 03/22/1965

## 2016-09-22 ENCOUNTER — Ambulatory Visit: Payer: Medicare Other | Attending: Radiation Oncology | Admitting: Physical Therapy

## 2016-09-22 DIAGNOSIS — M545 Low back pain: Secondary | ICD-10-CM | POA: Diagnosis not present

## 2016-09-22 DIAGNOSIS — R29898 Other symptoms and signs involving the musculoskeletal system: Secondary | ICD-10-CM

## 2016-09-22 NOTE — Therapy (Signed)
[place], [place], [address] Phone: [phone]   Fax:  [phone]  Physical Therapy Treatment  Patient Details  Name: Paula [name] MRN: [id number] Date of Birth: [birth year] Referring Provider: Dr. Eppie Gibson  Encounter Date: 09/22/2016      PT End of Session - 09/22/16 1333    Visit Number 7   Number of Visits 9   Date for PT Re-Evaluation 10/07/16   PT Start Time 1307   PT Stop Time 1348   PT Time Calculation (min) 41 min   Activity Tolerance Patient tolerated treatment well   Behavior During Therapy [facility] for tasks assessed/performed      Past Medical History:  Diagnosis Date  . Costochondritis   . Esophageal reflux    On omeprazole  . Family history of cancer   . Headache(784.0) 2011   Migraines  . Heart murmur   . Hypertension    started around 2011  . Iron deficiency anemia due to chronic blood loss 05/25/2015  . 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 On antineoplastic chemotherapy    5-FU and leucovorin  . Ovarian cyst   . Pneumonia   . Pulmonary emboli ([place])   . Stomach cancer ([place]) dx'd 08/2013    Past Surgical History:  Procedure Laterality Date  . ESOPHAGOGASTRODUODENOSCOPY N/A 09/16/2013   Procedure: ESOPHAGOGASTRODUODENOSCOPY (EGD);  Surgeon: Wonda Horner, MD;  Location: [facility];  Service: Endoscopy;  Laterality: N/A;  . LAPAROSCOPY N/A 09/20/2013   Procedure: DIAGNOSTIC LAPAROSCOPY,DISTAL GASTRECTOMY AND FEEDING GASTROJEJUNOSTOMY;  Surgeon: [name], MD;  Location: [facility];  Service: General;  Laterality: N/A;  . OOPHORECTOMY     Around 1985 left ovary removal  . PORTACATH PLACEMENT Left 11/18/2013   Procedure: INSERTION PORT-A-CATH;  Surgeon: [name], MD;  Location[facility];  Service: General;  Laterality: Left;  . TONSILLECTOMY     Around 1994  . TUBAL LIGATION      There were no vitals filed for this visit.      Subjective Assessment -  09/22/16 1308    Subjective Pt says she still is not feeling well. She says she is going to see Dr. Marin Olp tomorrow about some changes in her breast.    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 Had partial resection of stomach 09/22/13, then adjuvant chemo (finished January 2016) and radiation for 8 weeks, completed 06/17/14. She had a CT scan of the abdomen and pelvis on 06/20/16 which showed no evidence of active disease. Continued follow-ups with Dr. Isidore Moos and Dr. Marin Olp.  Currently wearing a cardiac monitor because sometimes she feels fluttery, faint and lightheaded.  Has a heart murmur and a leaky valve.  Hyperlipidemia; recently tried a statin but came off it because it made her muscles sore.  Has had blood drained from left knee in the past about two+ years ago. Has been getting steroid shots for bilat. hip bursitis   Patient Stated Goals try to resolve the issue with my back pain or do some exercises that would ease the pain   Currently in Pain? Yes   Pain Score 7                          [facility] Adult PT Treatment/Exercise - 09/22/16 0001      Lumbar Exercises: Supine   Other Supine Lumbar Exercises transverse abdominus series, Pelvic tilt, knee to chest, for low back stretch, knee drop with  abdominals engaged,  Heel slide to straight and return      Knee/Hip Exercises: Sidelying   Hip ABduction AROM;Right;Left;5 reps   Hip ABduction Limitations pt with difficulty with this especially on left side ( she says she recently had an injection for bursitis in this hip)  so changed to doing  hip abduction in  sitting with red theraband around bothe distal thighs and pt was able to do this much better x 10 repetitions with cues for upright posture      Moist Heat Therapy   Number Minutes Moist Heat 15 Minutes  in pron with pillow under abdomen and feet    Moist Heat Location Cervical;Lumbar Spine     Manual Therapy   Manual Therapy Soft tissue  mobilization;Manual Traction   Soft tissue mobilization in prone to neck and low back                         Long Term Clinic Goals - 08/30/16 1305      CC Long Term Goal  #1   Title Pt. will report at least 50% decrease in lower back pain   Time 4   Period Weeks   Status New     CC Long Term Goal  #2   Title Pt. will be independent in home exercise program for trunk flexibility and strengthening   Time 4   Period Weeks   Status New     CC Long Term Goal  #3   Title Pt. will show low back AROM not limited by more than 15% any direction for improved ADLs.   Time 4   Period Weeks   Status New            Plan - 09/22/16 1333    Clinical Impression Statement Pt states she still has been vomiting and the forcefull motion has increased low back pain. Reviewed exercises and she was able to do the pelvic tilt with good technique.  Pt found to have weak hip abductors also, and she did best activating this muscle group in sitting with theraband around legs The resistance on her arms activated her core and upper back muscles also    Rehab Potential Good   Clinical Impairments Affecting Rehab Potential previous abdominal surgery    PT Frequency 2x / week   PT Duration 4 weeks   PT Next Visit Plan Assess if she has been doing abdominal strengtening and hip strengtheing at home  Add neck tension stretches, and try supine scapular series with yellow theraband. Try thoracic mobilty exercise.    Continue heat and electrical stim.vs soft tissue work which pt says provides better relief    Consulted and Agree with Plan of Care Patient      Patient will benefit from skilled therapeutic intervention in order to improve the following deficits and impairments:  Pain, Decreased range of motion, Decreased strength  Visit Diagnosis: Bilateral low back pain, unspecified chronicity, with sciatica presence unspecified  Other symptoms and signs involving the musculoskeletal  system     Problem List Patient Active Problem List   Diagnosis Date Noted  . Abnormal uterine bleeding (AUB) 03/14/2016  . Iron deficiency anemia due to chronic blood loss 05/25/2015  . 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 Nausea with vomiting 05/11/2015  . Healed or old pulmonary embolism 03/31/2015  . Genetic testing 01/23/2015  . Insomnia 06/30/2014  . Neutropenia ([place]) 02/24/2014  . Neuropathy due to chemotherapeutic drug (  [place]) 12/18/2013  . Hypoalbuminemia 12/18/2013  . Malfunction of device 12/18/2013  . Leg edema 12/18/2013  . Neoplasm related pain 12/18/2013  . Other pancytopenia ([place]) 12/13/2013  . Hyponatremia 12/12/2013  . Hypokalemia 12/12/2013  . Weight loss 12/04/2013  . PEG tube malfunction ([place]) 12/04/2013  . Dehiscence of incision 12/04/2013  . Abnormal loss of weight 12/04/2013  . Family history of cancer   . Cancer of antrum of stomach ([place]) 11/07/2013  . 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 Palliative care encounter 11/04/2013  . Nausea & vomiting 11/04/2013  . Abdominal pain, left lower quadrant 11/04/2013  . N&V (nausea and vomiting) 11/04/2013  . Acute kidney injury ([place]) 11/03/2013  . Chronic low back pain 10/31/2013  . Pain of left upper arm 10/28/2013  . 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 Constipation, chronic 10/28/2013  . Protein-calorie malnutrition, severe ([place]) 10/23/2013  . Anemia, unspecified 10/23/2013  . Intractable nausea and vomiting 10/22/2013  . Intractable vomiting 10/22/2013  . Back pain 10/10/2013  . Malnutrition of moderate degree ([place]) 09/19/2013  . 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 Carcinoma of stomach ([place]) 09/18/2013  . Antral ulcer 09/16/2013  . Migraine headache 09/14/2013  . PTSD (post-traumatic stress disorder) 08/19/2013  . Left knee injury 07/30/2013  . Low back pain 07/30/2013  . Mitral regurgitation 02/15/2013  .  Chest pain 01/23/2012  . Essential hypertension 01/23/2012  . Murmur 01/23/2012   Donato Heinz. Owens Shark PT  Norwood Levo 09/22/2016, 5:57 PM  [place], [place], [address] Phone: [phone]   Fax:  [phone]  Name: LEEYA RUSCONI MRN: [id number] Date of Birth: 1965/07/15

## 2016-09-22 NOTE — Patient Instructions (Addendum)
  PELVIC TILT  Lie on back, legs bent. Exhale, tilting top of pelvis back, pubic bone up, to flatten lower back. Inhale, rolling pelvis opposite way, top forward, pubic bone down, arch in back. Repeat __10__ times. Do __2__ sessions per day. Copyright  VHI. All rights reserved.     Lie with hips and knees bent. Slowly inhale, and then exhale. Pull navel toward spine and tighten pelvic floor. Hold for __10_ seconds. Continue to breathe in and out during hold. Rest for _10__ seconds. Repeat __10_ times. Do __2-3_ times a day.   Copyright  VHI. All rights reserved.  Knee Fold   Lie on back, legs bent, arms by sides. Exhale, lifting knee to chest. Inhale, returning. Keep abdominals flat, navel to spine. Repeat __10__ times, alternating legs. Do __2__ sessions per day.  Copyright  VHI. All rights reserved.  Knee Drop   Keep pelvis stable. Without rotating hips, slowly drop knee to side, pause, return to center, bring knee across midline toward opposite hip. Feel obliques engaging. Repeat for ___10_ times each leg.  Copyright  VHI. All rights reserved.  Isometric Hold With Pelvic Floor (Hook-Lying)    Copyright  VHI. All rights reserved.  Heel Slide to Straight   Slide one leg down to straight. Return. Be sure pelvis does not rock forward, tilt, rotate, or tip to side. Do _10__ times. Restabilize pelvis. Repeat with other leg. Do __1-2_ sets, __2_ times per day.  [email or web address].exer.[email or web address]   Copyright  VHI. All rights reserved.    Sitting. Red band under both legs with end in opposite hand.  Pull knees apart.  Work 10 times

## 2016-09-23 ENCOUNTER — Other Ambulatory Visit (HOSPITAL_BASED_OUTPATIENT_CLINIC_OR_DEPARTMENT_OTHER): Payer: Medicare Other

## 2016-09-23 ENCOUNTER — Ambulatory Visit (HOSPITAL_BASED_OUTPATIENT_CLINIC_OR_DEPARTMENT_OTHER): Payer: Medicare Other | Admitting: Hematology & Oncology

## 2016-09-23 VITALS — BP 120/83 | HR 65 | Temp 98.7°F | Resp 17 | Wt 151.0 lb

## 2016-09-23 DIAGNOSIS — K909 Intestinal malabsorption, unspecified: Secondary | ICD-10-CM

## 2016-09-23 DIAGNOSIS — R109 Unspecified abdominal pain: Secondary | ICD-10-CM | POA: Diagnosis not present

## 2016-09-23 DIAGNOSIS — D500 Iron deficiency anemia secondary to blood loss (chronic): Secondary | ICD-10-CM | POA: Diagnosis not present

## 2016-09-23 DIAGNOSIS — G8929 Other chronic pain: Secondary | ICD-10-CM

## 2016-09-23 DIAGNOSIS — D510 Vitamin B12 deficiency anemia due to intrinsic factor deficiency: Secondary | ICD-10-CM

## 2016-09-23 DIAGNOSIS — C169 Malignant neoplasm of stomach, unspecified: Secondary | ICD-10-CM

## 2016-09-23 DIAGNOSIS — Z85028 Personal history of other malignant neoplasm of stomach: Secondary | ICD-10-CM

## 2016-09-23 LAB — CMP (CANCER CENTER ONLY)
ALK PHOS: 75 U/L (ref 26–84)
ALT: 19 U/L (ref 10–47)
AST: 26 U/L (ref 11–38)
Albumin: 3.5 g/dL (ref 3.3–5.5)
BUN: 8 mg/dL (ref 7–22)
CALCIUM: 9.2 mg/dL (ref 8.0–10.3)
CHLORIDE: 110 meq/L — AB (ref 98–108)
CO2: 29 mEq/L (ref 18–33)
Creat: 1.3 mg/dl — ABNORMAL HIGH (ref 0.6–1.2)
Glucose, Bld: 108 mg/dL (ref 73–118)
POTASSIUM: 4 meq/L (ref 3.3–4.7)
Sodium: 133 mEq/L (ref 128–145)
TOTAL PROTEIN: 6.9 g/dL (ref 6.4–8.1)
Total Bilirubin: 0.5 mg/dl (ref 0.20–1.60)

## 2016-09-23 LAB — CBC WITH DIFFERENTIAL (CANCER CENTER ONLY)
BASO#: 0 10*3/uL (ref 0.0–0.2)
BASO%: 0.4 % (ref 0.0–2.0)
EOS%: 1.7 % (ref 0.0–7.0)
Eosinophils Absolute: 0.1 10*3/uL (ref 0.0–0.5)
HCT: 37.1 % (ref 34.8–46.6)
HGB: 12.2 g/dL (ref 11.6–15.9)
LYMPH#: 1.7 10*3/uL (ref 0.9–3.3)
LYMPH%: 31.4 % (ref 14.0–48.0)
MCH: 29.2 pg (ref 26.0–34.0)
MCHC: 32.9 g/dL (ref 32.0–36.0)
MCV: 89 fL (ref 81–101)
MONO#: 0.5 10*3/uL (ref 0.1–0.9)
MONO%: 9.4 % (ref 0.0–13.0)
NEUT%: 57.1 % (ref 39.6–80.0)
NEUTROS ABS: 3 10*3/uL (ref 1.5–6.5)
PLATELETS: 160 10*3/uL (ref 145–400)
RBC: 4.18 10*6/uL (ref 3.70–5.32)
RDW: 14.1 % (ref 11.1–15.7)
WBC: 5.3 10*3/uL (ref 3.9–10.0)

## 2016-09-23 NOTE — Progress Notes (Signed)
Hematology and Oncology Follow Up Visit  Paula [name] [id number] 24-Dec-1965 51 y.o. 09/23/2016   Principle Diagnosis:  Stage IIA (T1a, N2, M0) gastric adenocarcinoma diagnosed in July of 2015  Current Therapy:   Observation    Prior Therapy:  1) Status post diagnostic laparoscopy, distal gastrectomy with billroth II anastamosis and feeding gastrojejunostomy tube under the care of Dr. Barry Dienes on 09/20/2013. 2) Adjuvant systemic chemotherapy with 5-FU 600 mg/M2 with leucovorin on days 1, 8, 15, 22, 29 and 36 every 8 weeks. 3) Adjuvant concurrent chemoradiation with Xeloda 650 mg/M2 by mouth twice a day for 5 days every week with radiation. The patient completed the course of radiotherapy but she declined to take Xeloda during her radiation.  Interim History:  [name] is here today for follow-up. She is feeling okay. She feels her my be something going on with her right breast. She has noticed a little bit of a change. She had a mammogram done 4 months ago. This mammogram looked okay with no suspicious findings.  She has her chronic abdominal pain. This is from all the surgery, radiation chemotherapy that she has had. She is on hydrocodone for this. This does seem to help.  I am not sure why she is on Xarelto. I looked at her vascular studies. I did not see any evidence of a thromboembolic event. I think she can stop the Xarelto. She is happy about this.  She's had no issues with bowels or bladder. She's had no cough. She's had no rashes. She's had no leg swelling.  She's had a pretty busy summer so far.  She's had no fever. Thankfully, this been no infections.  Overall, her performance status is ECOG 0.   Medications:  Allergies as of 09/23/2016      Reactions   Tape Itching      Medication List       Accurate as of 09/23/16  3:35 PM. Always use your most recent med list.          carvedilol 6.25 MG tablet Commonly known as:  COREG Take 6.25 mg by mouth 2  (two) times daily with a meal.   chlorhexidine 0.12 % solution Commonly known as:  PERIDEX   DULoxetine 30 MG capsule Commonly known as:  CYMBALTA TK ONE C PO ONCE D FOR 30 DAYS AND RECHECK IN 2 WEEKS   HYDROcodone-acetaminophen 5-325 MG tablet Commonly known as:  NORCO/VICODIN Take 1 tablet by mouth every 6 (six) hours as needed for moderate pain.   ibuprofen 600 MG tablet Commonly known as:  ADVIL,MOTRIN   methscopolamine 5 MG tablet Commonly known as:  PAMINE FORTE Take 5 mg by mouth every 6 (six) hours as needed.   methscopolamine 5 MG tablet Commonly known as:  PAMINE FORTE   MULTIVITAMIN & MINERAL PO Take 1 tablet by mouth daily.   omeprazole 20 MG capsule Commonly known as:  PRILOSEC Take 20 mg by mouth daily.   ondansetron 4 MG tablet Commonly known as:  ZOFRAN Take 2 tablets (8 mg total) by mouth 2 (two) times daily.   promethazine 12.5 MG tablet Commonly known as:  PHENERGAN Take 12.5 mg by mouth every 6 (six) hours as needed for nausea or vomiting.       Allergies:  Allergies  Allergen Reactions  . Tape Itching    Past Medical History, Surgical history, Social history, and Family History were reviewed and updated.  Review of Systems: All other 10 point review of systems is  negative.   Physical Exam:  weight is 151 lb (68.5 kg). Her oral temperature is 98.7 F (37.1 C). Her blood pressure is 120/83 and her pulse is 65. Her respiration is 17 and oxygen saturation is 100%.   Wt Readings from Last 3 Encounters:  09/23/16 151 lb (68.5 kg)  08/17/16 146 lb 12.8 oz (66.6 kg)  08/08/16 148 lb (67.1 kg)     Head and neck exam shows no ocular or oral lesions. There is no adenopathy in her neck. There is no scleral icterus. Thyroid is nonpalpable. Lungs are clear. Cardiac exam regular rate and rhythm with no murmurs, rubs or bruits. Her breast exam shows no bilateral breast masses. I cannot detect any changes with the areola. No erythema is noted. There  are no nodules. She has no bilateral axillary adenopathy.  Abdomen is soft. She has good bowel sounds. There is no fluid wave. There is no palpable liver or spleen tip. She has well-healed laparotomy scars. Extremity shows no clubbing, cyanosis or edema. Back exam shows no tenderness over the spine, ribs or hips. Extremities shows no clubbing, cyanosis or edema. Skin exam shows no rashes, ecchymoses or petechia.d  Lab Results  Component Value Date   WBC 5.3 09/23/2016   HGB 12.2 09/23/2016   HCT 37.1 09/23/2016   MCV 89 09/23/2016   PLT 160 09/23/2016   Lab Results  Component Value Date   FERRITIN 121 08/08/2016   IRON 72 08/08/2016   TIBC 239 08/08/2016   UIBC 167 08/08/2016   IRONPCTSAT 30 08/08/2016   Lab Results  Component Value Date   RBC 4.18 09/23/2016   No results found for: [facility] Lab Results  Component Value Date   IGGSERUM 1,273 05/21/2015   IGMSERUM 40 05/21/2015   No results found for: Odetta Pink, SPEI   Chemistry      Component Value Date/Time   NA 133 09/23/2016 1359   NA 140 09/24/2015 0953   K 4.0 09/23/2016 1359   K 4.0 09/24/2015 0953   CL 110 (H) 09/23/2016 1359   CO2 29 09/23/2016 1359   CO2 23 09/24/2015 0953   BUN 8 09/23/2016 1359   BUN 8.6 09/24/2015 0953   CREATININE 1.3 (H) 09/23/2016 1359   CREATININE 1.0 09/24/2015 0953      Component Value Date/Time   CALCIUM 9.2 09/23/2016 1359   CALCIUM 9.6 09/24/2015 0953   ALKPHOS 75 09/23/2016 1359   ALKPHOS 99 09/24/2015 0953   AST 26 09/23/2016 1359   AST 17 09/24/2015 0953   ALT 19 09/23/2016 1359   ALT 11 09/24/2015 0953   BILITOT 0.50 09/23/2016 1359   BILITOT 0.83 09/24/2015 0953     Impression and Plan: [name] is a very nice 51 year old African-American female with past history of stage II gastric cancer. She underwent a partial gastrectomy followed by adjuvant chemotherapy and radiation  therapy.   It has been 3 years now that she  had surgery. She completed all of her adjuvant therapy in January 2016.  There is still a risk of recurrence. However, I think that risk is going to be fairly low right now.  I do not see need for any scans right now.  We'll see what her iron studies show.  She has a lot going on in her life. I really want to make sure that she is able to help others who are less fortunate.   Volanda Napoleon, MD 8/3/20183:35  PM   

## 2016-09-24 LAB — VITAMIN B12: VITAMIN B 12: 347 pg/mL (ref 232–1245)

## 2016-09-26 ENCOUNTER — Telehealth: Payer: Self-pay | Admitting: *Deleted

## 2016-09-26 LAB — IRON AND TIBC
%SAT: 31 % (ref 21–57)
Iron: 76 ug/dL (ref 41–142)
TIBC: 249 ug/dL (ref 236–444)
UIBC: 173 ug/dL (ref 120–384)

## 2016-09-26 LAB — FERRITIN: Ferritin: 132 ng/ml (ref 9–269)

## 2016-09-26 NOTE — Telephone Encounter (Addendum)
Patient aware of results  ----- Message from Volanda Napoleon, MD sent at 09/26/2016 11:53 AM EDT ----- Call - iron levels are ok!!!  pete

## 2016-09-28 ENCOUNTER — Ambulatory Visit: Payer: Medicare Other | Admitting: Physical Therapy

## 2016-09-28 DIAGNOSIS — M545 Low back pain: Secondary | ICD-10-CM

## 2016-09-28 DIAGNOSIS — R29898 Other symptoms and signs involving the musculoskeletal system: Secondary | ICD-10-CM

## 2016-09-28 NOTE — Therapy (Addendum)
[place], [place], [address] Phone: [phone]   Fax:  [phone]  Physical Therapy Treatment  Patient Details  Name: Paula [name] MRN: [id number] Date of Birth: 1965/12/23 Referring Provider: Dr. Eppie Gibson  Encounter Date: 09/28/2016      PT End of Session - 09/28/16 1721    Visit Number 8   Number of Visits 9   Date for PT Re-Evaluation 10/07/16   PT Start Time 8938   PT Stop Time 1517   PT Time Calculation (min) 39 min   Activity Tolerance Patient tolerated treatment well   Behavior During Therapy [facility] for tasks assessed/performed      Past Medical History:  Diagnosis Date  . Costochondritis   . Esophageal reflux    On omeprazole  . Family history of cancer   . Headache(784.0) 2011   Migraines  . Heart murmur   . Hypertension    started around 2011  . Iron deficiency anemia due to chronic blood loss 05/25/2015  . 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 On antineoplastic chemotherapy    5-FU and leucovorin  . Ovarian cyst   . Pneumonia   . Pulmonary emboli ([place])   . Stomach cancer ([place]) dx'd 08/2013    Past Surgical History:  Procedure Laterality Date  . ESOPHAGOGASTRODUODENOSCOPY N/A 09/16/2013   Procedure: ESOPHAGOGASTRODUODENOSCOPY (EGD);  Surgeon: Wonda Horner, MD;  Location: [facility];  Service: Endoscopy;  Laterality: N/A;  . LAPAROSCOPY N/A 09/20/2013   Procedure: DIAGNOSTIC LAPAROSCOPY,DISTAL GASTRECTOMY AND FEEDING GASTROJEJUNOSTOMY;  Surgeon: [name], MD;  Location: [facility];  Service: General;  Laterality: N/A;  . OOPHORECTOMY     Around 1985 left ovary removal  . PORTACATH PLACEMENT Left 11/18/2013   Procedure: INSERTION PORT-A-CATH;  Surgeon: [name], MD;  Location: [facility];  Service: General;  Laterality: Left;  . TONSILLECTOMY     Around 1994  . TUBAL LIGATION      There were no vitals filed for this visit.      Subjective Assessment -  09/28/16 1728    Subjective Pain was really bad earlier today, but took an advil and that helped.  Says her doctor thinks a medication called "sabato" has caused her nausea, and that doctor is weaning her off it currently.   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 Had partial resection of stomach 09/22/13, then adjuvant chemo (finished January 2016) and radiation for 8 weeks, completed 06/17/14. She had a CT scan of the abdomen and pelvis on 06/20/16 which showed no evidence of active disease. Continued follow-ups with Dr. Isidore Moos and Dr. Marin Olp.  Currently wearing a cardiac monitor because sometimes she feels fluttery, faint and lightheaded.  Has a heart murmur and a leaky valve.  Hyperlipidemia; recently tried a statin but came off it because it made her muscles sore.  Has had blood drained from left knee in the past about two+ years ago. Has been getting steroid shots for bilat. hip bursitis   Currently in Pain? Yes   Pain Score 6    Pain Location Back   Pain Descriptors / Indicators Aching   Aggravating Factors  probably lifting a large rug yesterday   Pain Relieving Factors advil                         [facility] Adult PT Treatment/Exercise - 09/28/16 0001      Therapeutic Activites    Therapeutic Activities Other  Therapeutic Activities   Other Therapeutic Activities Reviewed and pt. practiced proper method of transitioning from sit to supine and back to avoid irritating her back.     Lumbar Exercises: Supine   Other Supine Lumbar Exercises Reviewed pelvic tilt instruction with patient and she was able to perform this correctly several times.     Manual Therapy   Manual Therapy Joint mobilization   Joint Mobilization Gentle grade II PA joint mobs to lumbar spine.   Soft tissue mobilization In prone with Biotone to mid to low back.  Tender points at middle trap area                        [place] - 09/28/16 1725      CC  Long Term Goal  #1   Title Pt. will report at least 50% decrease in lower back pain   Status On-going     CC Long Term Goal  #2   Title Pt. will be independent in home exercise program for trunk flexibility and strengthening   Status Partially Met     CC Long Term Goal  #3   Title Pt. will show low back AROM not limited by more than 15% any direction for improved ADLs.   Status On-going            Plan - 09/28/16 1721    Clinical Impression Statement Note that electronic medical record system was down during patient's appointment so plan could not be seen for this session.  Pt. came in today stating she had had a lot of back pain earlier today and wasn't sure of the cause, then remembered that she (along with her mother) had lifted a sizeable rug yesterday.  Pelvic tilts were reviewed and patient was able to perform these; otherwise, focus was on passive, pain relieving treatment today.  Pt. sounds like she has been doing better until this setback; her nausea cause seems to have been pinpointed to a medication, which her doctor is now weaning her off.   Rehab Potential Good   Clinical Impairments Affecting Rehab Potential previous abdominal surgery    PT Frequency 2x / week   PT Duration 4 weeks   PT Treatment/Interventions ADLs/Self Care Home Management;Moist Heat;Cryotherapy;Therapeutic exercise;Patient/family education;Manual techniques;Scar mobilization;Passive range of motion;Taping;Electrical Stimulation   PT Next Visit Plan Will need renewal next. Assess if she has been doing abdominal strengthening and hip strengthening at home.  Add neck tension stretches, and try supine scapular series with yellow theraband. Try thoracic mobilty exercise.    Continue heat and electrical stim.vs soft tissue work which pt says provides better relief    PT Home Exercise Plan low back stretches   Consulted and Agree with Plan of Care Patient      Patient will benefit from skilled therapeutic  intervention in order to improve the following deficits and impairments:  Pain, Decreased range of motion, Decreased strength  Visit Diagnosis: Bilateral low back pain, unspecified chronicity, with sciatica presence unspecified  Other symptoms and signs involving the musculoskeletal system     Problem List Patient Active Problem List   Diagnosis Date Noted  . Abnormal uterine bleeding (AUB) 03/14/2016  . Iron deficiency anemia due to chronic blood loss 05/25/2015  . 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 Nausea with vomiting 05/11/2015  . Healed or old pulmonary embolism 03/31/2015  . Genetic testing 01/23/2015  . Insomnia 06/30/2014  . Neutropenia ([place]) 02/24/2014  . 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 Hypoalbuminemia 12/18/2013  . Malfunction of device 12/18/2013  . Leg edema 12/18/2013  . Neoplasm related pain 12/18/2013  . Other pancytopenia ([place]) 12/13/2013  . Hyponatremia 12/12/2013  . Hypokalemia 12/12/2013  . Weight loss 12/04/2013  . PEG tube malfunction ([place]) 12/04/2013  . Dehiscence of incision 12/04/2013  . Abnormal loss of weight 12/04/2013  . Family history of cancer   . Cancer of antrum of stomach ([place]) 11/07/2013  . 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 Palliative care encounter 11/04/2013  . Nausea & vomiting 11/04/2013  . Abdominal pain, left lower quadrant 11/04/2013  . N&V (nausea and vomiting) 11/04/2013  . Acute kidney injury ([place]) 11/03/2013  . Chronic low back pain 10/31/2013  . Pain of left upper arm 10/28/2013  . 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 Constipation, chronic 10/28/2013  . Protein-calorie malnutrition, severe ([place]) 10/23/2013  . Anemia, unspecified 10/23/2013  . Intractable nausea and vomiting 10/22/2013  . Intractable vomiting 10/22/2013  . Back pain 10/10/2013  . Malnutrition of moderate degree ([place]) 09/19/2013  . Gastric carcinoma s/p Distal gastrectomy/GJ  (Billroth II) 09/20/2013 09/18/2013  . Carcinoma of stomach ([place]) 09/18/2013  . Antral ulcer 09/16/2013  . Migraine headache 09/14/2013  . PTSD (post-traumatic stress disorder) 08/19/2013  . Left knee injury 07/30/2013  . Low back pain 07/30/2013  . Mitral regurgitation 02/15/2013  . Chest pain 01/23/2012  . Essential hypertension 01/23/2012  . Murmur 01/23/2012    SALISBURY,DONNA 09/28/2016, 5:32 PM  [place], [place], [address] Phone: [phone]   Fax:  [phone]  Name: PADME ARRIAGA MRN: [id number] Date of Birth: 1965/12/20  Serafina Royals, PT 09/28/16 5:32 PM  Serafina Royals, PT 09/28/16 5:32 PM

## 2016-09-29 ENCOUNTER — Ambulatory Visit: Payer: Medicare Other | Admitting: Physical Therapy

## 2016-09-29 DIAGNOSIS — R29898 Other symptoms and signs involving the musculoskeletal system: Secondary | ICD-10-CM

## 2016-09-29 DIAGNOSIS — M545 Low back pain: Secondary | ICD-10-CM

## 2016-09-29 NOTE — Patient Instructions (Addendum)
Cancer Fit Program at [facility]   Call [place] at [phone]  [facility] at the [facility]   Hip Flexor Stretch    Lying on back near edge of bed, bend one leg pull it up to chest if you can , foot flat. Hang other leg over edge, relaxed, thigh resting entirely on bed for _2___ minutes. Repeat 3____ times Advanced Exercise: Bend knee back keeping thigh in contact with bed.  [email or web address].[email or web address]    Abduction    Lift leg up toward ceiling. Return. Use __0__ lbs on ankle. Repeat _10___ times each leg. Do _2___ sessions per day.  [email or web address].[email or web address]

## 2016-09-29 NOTE — Therapy (Signed)
[place], [place], [address] Phone: [phone]   Fax:  [phone]  Physical Therapy Treatment  Patient Details  Name: Paula [name] MRN: [id number] Date of Birth: 06-Jan-1966 Referring Provider: Dr. Eppie Gibson  Encounter Date: 09/29/2016      PT End of Session - 09/29/16 1225    Visit Number 9   Number of Visits 17   Date for PT Re-Evaluation 10/27/16   PT Start Time 1105   PT Stop Time 1158   PT Time Calculation (min) 53 min   Activity Tolerance Patient tolerated treatment well   Behavior During Therapy [facility] for tasks assessed/performed      Past Medical History:  Diagnosis Date  . Costochondritis   . Esophageal reflux    On omeprazole  . Family history of cancer   . Headache(784.0) 2011   Migraines  . Heart murmur   . Hypertension    started around 2011  . Iron deficiency anemia due to chronic blood loss 05/25/2015  . 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 On antineoplastic chemotherapy    5-FU and leucovorin  . Ovarian cyst   . Pneumonia   . Pulmonary emboli ([place])   . Stomach cancer ([place]) dx'd 08/2013    Past Surgical History:  Procedure Laterality Date  . ESOPHAGOGASTRODUODENOSCOPY N/A 09/16/2013   Procedure: ESOPHAGOGASTRODUODENOSCOPY (EGD);  Surgeon: Wonda Horner, MD;  Location: [facility];  Service: Endoscopy;  Laterality: N/A;  . LAPAROSCOPY N/A 09/20/2013   Procedure: DIAGNOSTIC LAPAROSCOPY,DISTAL GASTRECTOMY AND FEEDING GASTROJEJUNOSTOMY;  Surgeon: [name], MD;  Location: [facility];  Service: General;  Laterality: N/A;  . OOPHORECTOMY     Around 1985 left ovary removal  . PORTACATH PLACEMENT Left 11/18/2013   Procedure: INSERTION PORT-A-CATH;  Surgeon: [name], MD;  [facility];  Service: General;  Laterality: Left;  . TONSILLECTOMY     Around 1994  . TUBAL LIGATION      There were no vitals filed for this visit.      Subjective Assessment -  09/29/16 1121    Subjective (P)  Pt says she is still having pain especially when she wakes up in the morning.  She usually sleeps on her with heating pad to her back. She says she cannot sleep without the heating pad.  Pt is interested in getting more exercise and was receptive to information about the [place] at [facility] and finding out bout yoga and tai chi classes at our [facility].    Patient Stated Goals (P)  try to resolve the issue with my back pain or do some exercises that would ease the pain   Currently in Pain? (P)  Yes   Pain Score (P)  5    Pain Location (P)  Back   Pain Orientation (P)  Lower   Pain Descriptors / Indicators (P)  Aching                         [facility] Adult PT Treatment/Exercise - 09/29/16 0001      Self-Care   Self-Care Other Self-Care Comments   Other Self-Care Comments  issued information about community execise programs      Lumbar Exercises: Stretches   Double Knee to Chest Stretch 3 reps;10 seconds   Lower Trunk Rotation 3 reps;10 seconds   Hip Flexor Stretch 3 reps;10 seconds  in supine and sitting    Pelvic Tilt 5 reps  Knee/Hip Exercises: Sidelying   Hip ABduction AROM;Right;Left;5 reps   Hip ABduction Limitations pt with difficulty with this especially on left side ( she says she recently had an injection for bursitis in this hip)  so changed to doing  hip abduction in  sitting with red theraband around bothe distal thighs and pt was able to do this much better x 10 repetitions with cues for upright posture      Moist Heat Therapy   Number Minutes Moist Heat 15 Minutes   Moist Heat Location Cervical;Lumbar Spine     Manual Therapy   Soft tissue mobilization in prone to neck and low back                 PT Education - 09/29/16 1228    Education provided Yes   Education Details information about cummunity exercise at our [employer], [place] and [place] home  exercise    Person(s) Educated Patient   Methods Explanation;Demonstration;Handout   Comprehension Verbalized understanding;Returned demonstration                [facility] Goals - 09/29/16 1234      CC Long Term Goal  #1   Title Pt. will report at least 50% decrease in lower back pain   Time 4   Period Weeks   Status On-going     CC Long Term Goal  #2   Title Pt. will be independent in home exercise program for trunk flexibility and strengthening   Time 4   Period Weeks   Status On-going     CC Long Term Goal  #3   Title Pt. will show low back AROM not limited by more than 15% any direction for improved ADLs.   Time 4   Period Weeks   Status On-going     CC Long Term Goal  #4   Title Pt will report a plan for continuing exercise in the community after discharge    Time 4   Period Weeks            Plan - 09/29/16 1226    Clinical Impression Statement Pt reports she is feeling better than when she first came, but still is having pain. She wants to keep coming and is hopeful that in 4 more weeks she will be ready to transition to community exercise programs. She was happy to upgrade home program to include hip felxor stretch and will continue with other home exercise.  Pt reported she felt better upon leavng session. Renewal sent.    Rehab Potential Good   Clinical Impairments Affecting Rehab Potential previous abdominal surgery    PT Frequency 2x / week   PT Duration 4 weeks   PT Treatment/Interventions ADLs/Self Care Home Management;Moist Heat;Cryotherapy;Therapeutic exercise;Patient/family education;Manual techniques;Scar mobilization;Passive range of motion;Taping;Electrical Stimulation   PT Next Visit Plan Will need Gcode next  next. Assess if she has been doing home ex especially hip flexor stretch.    Focus on neck. Add neck tension stretches, and try supine scapular series with yellow theraband. Try thoracic mobilty exercise.    Continue heat soft  tissue work as pt gets  more relief from this than from [employer] and Agree with Plan of Care Patient      Patient will benefit from skilled therapeutic intervention in order to improve the following deficits and impairments:  Pain, Decreased range of motion, Decreased strength  Visit Diagnosis: Bilateral  low back pain, unspecified chronicity, with sciatica presence unspecified  Other symptoms and signs involving the musculoskeletal system     Problem List Patient Active Problem List   Diagnosis Date Noted  . Abnormal uterine bleeding (AUB) 03/14/2016  . Iron deficiency anemia due to chronic blood loss 05/25/2015  . 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 Nausea with vomiting 05/11/2015  . Healed or old pulmonary embolism 03/31/2015  . Genetic testing 01/23/2015  . Insomnia 06/30/2014  . Neutropenia ([place]) 02/24/2014  . Neuropathy due to chemotherapeutic drug ([place]) 12/18/2013  . Hypoalbuminemia 12/18/2013  . Malfunction of device 12/18/2013  . Leg edema 12/18/2013  . Neoplasm related pain 12/18/2013  . Other pancytopenia ([place]) 12/13/2013  . Hyponatremia 12/12/2013  . Hypokalemia 12/12/2013  . Weight loss 12/04/2013  . PEG tube malfunction ([place]) 12/04/2013  . Dehiscence of incision 12/04/2013  . Abnormal loss of weight 12/04/2013  . Family history of cancer   . Cancer of antrum of stomach ([place]) 11/07/2013  . 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 Palliative care encounter 11/04/2013  . Nausea & vomiting 11/04/2013  . Abdominal pain, left lower quadrant 11/04/2013  . N&V (nausea and vomiting) 11/04/2013  . Acute kidney injury ([place]) 11/03/2013  . Chronic low back pain 10/31/2013  . Pain of left upper arm 10/28/2013  . 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 Constipation, chronic 10/28/2013  . Protein-calorie malnutrition, severe ([place]) 10/23/2013  . Anemia, unspecified 10/23/2013  . Intractable nausea and vomiting  10/22/2013  . Intractable vomiting 10/22/2013  . Back pain 10/10/2013  . Malnutrition of moderate degree ([place]) 09/19/2013  . 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 Carcinoma of stomach ([place]) 09/18/2013  . Antral ulcer 09/16/2013  . Migraine headache 09/14/2013  . PTSD (post-traumatic stress disorder) 08/19/2013  . Left knee injury 07/30/2013  . Low back pain 07/30/2013  . Mitral regurgitation 02/15/2013  . Chest pain 01/23/2012  . Essential hypertension 01/23/2012  . Murmur 01/23/2012   Donato Heinz. Owens Shark PT  Norwood Levo 09/29/2016, 12:38 PM  [place], [place], [address] Phone: [phone]   Fax:  [phone]  Name: ELITA DAME MRN: [id number] Date of Birth: 1965/03/28

## 2016-10-05 ENCOUNTER — Ambulatory Visit: Payer: Medicare Other | Admitting: Physical Therapy

## 2016-10-05 DIAGNOSIS — R29898 Other symptoms and signs involving the musculoskeletal system: Secondary | ICD-10-CM

## 2016-10-05 DIAGNOSIS — M545 Low back pain: Secondary | ICD-10-CM

## 2016-10-05 NOTE — Therapy (Signed)
[place], [place], [address] Phone: [phone]   Fax:  [phone]  Physical Therapy Treatment  Patient Details  Name: Paula [name] MRN: [id number] Date of Birth: Jul 09, 1965 Referring Provider: Dr.Squire   Encounter Date: 10/05/2016      PT End of Session - 10/05/16 1441    Visit Number 10   Number of Visits 17   Date for PT Re-Evaluation 10/27/16   PT Start Time 1301   PT Stop Time 4315   PT Time Calculation (min) 46 min   Activity Tolerance Patient tolerated treatment well   Behavior During Therapy [facility] for tasks assessed/performed      Past Medical History:  Diagnosis Date  . Costochondritis   . Esophageal reflux    On omeprazole  . Family history of cancer   . Headache(784.0) 2011   Migraines  . Heart murmur   . Hypertension    started around 2011  . Iron deficiency anemia due to chronic blood loss 05/25/2015  . 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 On antineoplastic chemotherapy    5-FU and leucovorin  . Ovarian cyst   . Pneumonia   . Pulmonary emboli ([place])   . 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 ESOPHAGOGASTRODUODENOSCOPY N/A 09/16/2013   Procedure: ESOPHAGOGASTRODUODENOSCOPY (EGD);  Surgeon: Wonda Horner, MD;  Location: [facility];  Service: Endoscopy;  Laterality: N/A;  . LAPAROSCOPY N/A 09/20/2013   Procedure: DIAGNOSTIC LAPAROSCOPY,DISTAL GASTRECTOMY AND FEEDING GASTROJEJUNOSTOMY;  Surgeon: [name], MD;  Location: [facility];  Service: General;  Laterality: N/A;  . OOPHORECTOMY     Around 1985 left ovary removal  . PORTACATH PLACEMENT Left 11/18/2013   Procedure: INSERTION PORT-A-CATH;  Surgeon: [name], MD;  [facility];  Service: General;  Laterality: Left;  . TONSILLECTOMY     Around 1994  . TUBAL LIGATION      There were no vitals filed for this visit.      Subjective Assessment - 10/05/16  1302    Subjective "I got my new teeth and I'll be able to wear them tomorrow."  "I like the new stretch and when I stand up straight I can tell that it helps."  Doesn't have questions about doing those.  She says she can tell the difference since coming here and that the stretches help.  I walked this morning as well.   Currently in Pain? Yes   Pain Score 5    Pain Location Back   Pain Orientation Lower   Pain Descriptors / Indicators Aching;Pressure   Aggravating Factors  Not sure; doing household chores   Pain Relieving Factors plans to cancel some of her events coming up in order to take better care of herself                         [facility] Adult PT Treatment/Exercise - 10/05/16 0001      Neck Exercises: Seated   Neck Retraction 5 reps;5 secs   Lateral Flexion Right;Left  10 counts x 3 each way   Other Seated Exercise levator stretch 10 counts x 3 each way     Shoulder Exercises: Supine   Horizontal ABduction Strengthening;Both;5 reps;Theraband   Theraband Level (Shoulder Horizontal ABduction) Level 1 (Yellow)   External Rotation Strengthening;Both;5 reps;Theraband   Theraband Level (Shoulder External Rotation) Level 1 (Yellow)   Flexion Strengthening;Both;5 reps;Theraband  narrow and  wide grips   Theraband Level (Shoulder Flexion) Level 1 (Yellow)   Other Supine Exercises active D2 vs. yellow Theraband x 5 each side     Shoulder Exercises: Standing   Retraction AROM;Both;5 reps  5 seconds     Manual Therapy   Joint Mobilization Gentle grade II PA joint mobs to lumbar spine.   Soft tissue mobilization In prone with Biotone to mid to low back.                PT Education - 10-11-2016 1331    Education provided Yes   Education Details neck tension stretches, supine scapular series vs. yellow Theraband   Person(s) Educated Patient   Methods Explanation;Demonstration;Verbal cues;Handout   Comprehension Verbalized understanding;Returned demonstration                 [facility] Goals - 09/29/16 1234      CC Long Term Goal  #1   Title Pt. will report at least 50% decrease in lower back pain   Time 4   Period Weeks   Status On-going     CC Long Term Goal  #2   Title Pt. will be independent in home exercise program for trunk flexibility and strengthening   Time 4   Period Weeks   Status On-going     CC Long Term Goal  #3   Title Pt. will show low back AROM not limited by more than 15% any direction for improved ADLs.   Time 4   Period Weeks   Status On-going     CC Long Term Goal  #4   Title Pt will report a plan for continuing exercise in the community after discharge    Time 4   Period Weeks            Plan - 10-11-2016 1441    Clinical Impression Statement Pt. continues to report positive benefit of therapy.  She likes the new hip flexor stretch and feels that helps her back.  She was attentive and focused on doing new exercises correctly that she was given today.  She also felt that just a short bout of PA mobilization to lumbar spine helped take off some of the feeling of pressure there.   Rehab Potential Good   Clinical Impairments Affecting Rehab Potential previous abdominal surgery    PT Frequency 2x / week   PT Duration 4 weeks   PT Treatment/Interventions ADLs/Self Care Home Management;Moist Heat;Cryotherapy;Therapeutic exercise;Patient/family education;Manual techniques;Scar mobilization;Passive range of motion;Taping;Electrical Stimulation   PT Next Visit Plan Continue gradual progression of core stretching and strengthening exercises.  Review neck tension and supine scapular exercises.  Continue soft tissue work, gentle PA lumbar mobilizations.   PT Home Exercise Plan low back stretches, supine scapular, neck tension stretches, hip flexor stretches, pelvic tilts   Consulted and Agree with Plan of Care Patient      Patient will benefit from skilled therapeutic intervention in order to improve the  following deficits and impairments:  Pain, Decreased range of motion, Decreased strength  Visit Diagnosis: Bilateral low back pain, unspecified chronicity, with sciatica presence unspecified  Other symptoms and signs involving the musculoskeletal system       G-Codes - Oct 11, 2016 1444    Functional Assessment Tool Used (Outpatient Only) clinical judgement   Functional Limitation Changing and maintaining body position  related to back flexibility   Changing and Maintaining Body Position Current Status (T6256) At least 20 percent but less than 40 percent  impaired, limited or restricted   Changing and Maintaining Body Position Goal Status (M0947) At least 1 percent but less than 20 percent impaired, limited or restricted      Problem List Patient Active Problem List   Diagnosis Date Noted  . Abnormal uterine bleeding (AUB) 03/14/2016  . Iron deficiency anemia due to chronic blood loss 05/25/2015  . 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 Nausea with vomiting 05/11/2015  . Healed or old pulmonary embolism 03/31/2015  . Genetic testing 01/23/2015  . Insomnia 06/30/2014  . Neutropenia ([place]) 02/24/2014  . Neuropathy due to chemotherapeutic drug ([place]) 12/18/2013  . Hypoalbuminemia 12/18/2013  . Malfunction of device 12/18/2013  . Leg edema 12/18/2013  . Neoplasm related pain 12/18/2013  . Other pancytopenia ([place]) 12/13/2013  . Hyponatremia 12/12/2013  . Hypokalemia 12/12/2013  . Weight loss 12/04/2013  . PEG tube malfunction ([place]) 12/04/2013  . Dehiscence of incision 12/04/2013  . Abnormal loss of weight 12/04/2013  . Family history of cancer   . Cancer of antrum of stomach ([place]) 11/07/2013  . 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 Palliative care encounter 11/04/2013  . Nausea & vomiting 11/04/2013  . Abdominal pain, left lower quadrant 11/04/2013  . N&V (nausea and vomiting) 11/04/2013  . Acute kidney injury ([place]) 11/03/2013   . Chronic low back pain 10/31/2013  . Pain of left upper arm 10/28/2013  . 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 Constipation, chronic 10/28/2013  . Protein-calorie malnutrition, severe ([place]) 10/23/2013  . Anemia, unspecified 10/23/2013  . Intractable nausea and vomiting 10/22/2013  . Intractable vomiting 10/22/2013  . Back pain 10/10/2013  . Malnutrition of moderate degree ([place]) 09/19/2013  . 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 Carcinoma of stomach ([place]) 09/18/2013  . Antral ulcer 09/16/2013  . Migraine headache 09/14/2013  . PTSD (post-traumatic stress disorder) 08/19/2013  . Left knee injury 07/30/2013  . Low back pain 07/30/2013  . Mitral regurgitation 02/15/2013  . Chest pain 01/23/2012  . Essential hypertension 01/23/2012  . Murmur 01/23/2012    Saron Tweed 10/05/2016, 2:45 PM  [place], [place], [address] Phone: [phone]   Fax:  [phone]  Name: HULDAH MARIN MRN: [id number] Date of Birth: 04/25/1965  Serafina Royals, PT 10/05/16 2:45 PM

## 2016-10-05 NOTE — Patient Instructions (Signed)
Levator Stretch   Grasp seat or sit on hand on side to be stretched. Turn head toward other side and look down. Use hand on head to gently stretch neck in that position. Hold __10__ seconds. Repeat on other side. Repeat __3__ times. Do __2-3__ sessions per day.  http://gt2.exer.us/30   Copyright  VHI. All rights reserved.  Side-Bending   One hand on opposite side of head, pull head to side as far as is comfortable. Stop if there is pain. Hold __10__ seconds. Repeat with other hand to other side. Repeat __3__ times. Do __2-3__ sessions per day.   Copyright  VHI. All rights reserved.  Scapular Retraction (Standing)   With arms at sides, pinch shoulder blades together. Repeat __5__ times per set. Do __1__ sets per session. Do __2-3__ sessions per day.  http://orth.exer.us/944   Copyright  VHI. All rights reserved.  Chin Protraction / Retraction   Slide head forward keeping chin level. Slide head back, pulling chin in. Hold each position _5__ seconds. Repeat __5_ times. Do _2-3__ sessions per day.  Copyright  VHI. All rights reserved.    Over Head Pull: Narrow and Wide Grip   Cancer Rehab [phone]   On back, knees bent, feet flat, band across thighs, elbows straight but relaxed. Pull hands apart (start). Keeping elbows straight, bring arms up and over head, hands toward floor. Keep pull steady on band. Hold momentarily. Return slowly, keeping pull steady, back to start. Then do same with a wider grip on the band (past shoulder width) Repeat _5-10__ times. Band color __yellow____   Side Pull: Double Arm   On back, knees bent, feet flat. Arms perpendicular to body, shoulder level, elbows straight but relaxed. Pull arms out to sides, elbows straight. Resistance band comes across collarbones, hands toward floor. Hold momentarily. Slowly return to starting position. Repeat _5-10__ times. Band color _yellow____   Sword   On back, knees bent, feet flat, left hand on left  hip, right hand above left. Pull right arm DIAGONALLY (hip to shoulder) across chest. Bring right arm along head toward floor. Hold momentarily. Slowly return to starting position. Repeat _5-10__ times. Do with left arm. Band color _yellow_____   Shoulder Rotation: Double Arm   On back, knees bent, feet flat, elbows tucked at sides, bent 90, hands palms up. Pull hands apart and down toward floor, keeping elbows near sides. Hold momentarily. Slowly return to starting position. Repeat _5-10__ times. Band color __yellow____

## 2016-10-06 ENCOUNTER — Ambulatory Visit: Payer: Medicare Other | Admitting: Physical Therapy

## 2016-10-07 ENCOUNTER — Encounter: Payer: Medicare Other | Admitting: Physical Therapy

## 2016-10-10 ENCOUNTER — Ambulatory Visit: Payer: Medicare Other | Admitting: Physical Therapy

## 2016-10-10 DIAGNOSIS — M545 Low back pain: Secondary | ICD-10-CM

## 2016-10-10 DIAGNOSIS — R29898 Other symptoms and signs involving the musculoskeletal system: Secondary | ICD-10-CM

## 2016-10-10 NOTE — Therapy (Signed)
[place], [place], [address] Phone: [phone]   Fax:  [phone]  Physical Therapy Treatment  Patient Details  Name: Paula [name] MRN: [id number] Date of Birth: June 20, 1965 Referring Provider: Dr.Squire   Encounter Date: 10/10/2016      PT End of Session - 10/10/16 1406    Visit Number 11   Number of Visits 17   Date for PT Re-Evaluation 10/27/16   PT Start Time 1315   PT Stop Time 1405   PT Time Calculation (min) 50 min   Activity Tolerance Patient tolerated treatment well   Behavior During Therapy [facility] for tasks assessed/performed      Past Medical History:  Diagnosis Date  . Costochondritis   . Esophageal reflux    On omeprazole  . Family history of cancer   . Headache(784.0) 2011   Migraines  . Heart murmur   . Hypertension    started around 2011  . Iron deficiency anemia due to chronic blood loss 05/25/2015  . 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 On antineoplastic chemotherapy    5-FU and leucovorin  . Ovarian cyst   . Pneumonia   . Pulmonary emboli ([place])   . 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 ESOPHAGOGASTRODUODENOSCOPY N/A 09/16/2013   Procedure: ESOPHAGOGASTRODUODENOSCOPY (EGD);  Surgeon: Wonda Horner, MD;  Location: [facility];  Service: Endoscopy;  Laterality: N/A;  . LAPAROSCOPY N/A 09/20/2013   Procedure: DIAGNOSTIC LAPAROSCOPY,DISTAL GASTRECTOMY AND FEEDING GASTROJEJUNOSTOMY;  Surgeon: [name], MD;  [facility];  Service: General;  Laterality: N/A;  . OOPHORECTOMY     Around 1985 left ovary removal  . PORTACATH PLACEMENT Left 11/18/2013   Procedure: INSERTION PORT-A-CATH;  Surgeon: [name], MD;  [facility];  Service: General;  Laterality: Left;  . TONSILLECTOMY     Around 1994  . TUBAL LIGATION      There were no vitals filed for this visit.      Subjective Assessment - 10/10/16  1317    Subjective "I got my teeth."  "I've been sore right here (on the left upper flank)." This last week has been a good week.   Currently in Pain? Yes   Pain Score 7    Pain Location Flank   Pain Orientation Left;Upper   Pain Descriptors / Indicators Sore   Aggravating Factors  bra   Pain Relieving Factors bra off   Pain Score 5   Pain Location Shoulder   Pain Orientation Right;Left   Pain Descriptors / Indicators Heaviness  "like a weight on my shoulders"   Pain Frequency Constant   Aggravating Factors  comes and goes; stressors   Pain Relieving Factors shoulder rolls, good posture, meditating            [facility] PT Assessment - 10/10/16 0001      AROM   Lumbar Flexion reaches approx. 12 inches fingertips to floor   Lumbar Extension 50% loss   Lumbar - Right Side Bend WFL   Lumbar - Left Side Bend WFL   Lumbar - Right Rotation 15% loss   Lumbar - Left Rotation 15% loss                     [facility] Adult PT Treatment/Exercise - 10/10/16 0001      Lumbar Exercises: Standing   Other Standing Lumbar Exercises hold physioball out front, then do mini squat, bringing  ball towards knees; then straighten knees and raise ball up overhead, tightening abdominal muscles in the process, 5 reps     Lumbar Exercises: Seated   Other Seated Lumbar Exercises seated on physioball:  pelvic motions forward-back and side to side; then circles with pelvis clockwise and counterclockwise x5 each way.   Other Seated Lumbar Exercises seated on physioball, hands across chest, slight lean back x 10; then slight lean back and arms pointed left, then right (out from body) x 5 each way.     Lumbar Exercises: Prone   Other Prone Lumbar Exercises prone on elbows to prone press-up x 5     Lumbar Exercises: Quadruped   [place] 10 reps     Manual Therapy   Joint Mobilization Gentle grade II PA joint mobs to lumbar spine.   Soft tissue mobilization In prone with Biotone to mid to low  back.  tightness noted in right upper gluteals today                PT Education - 10/10/16 1406    Education provided Yes   Education Details about where to obtain a physioball   Person(s) Educated Patient   Methods Explanation   Comprehension Verbalized understanding                [facility] Goals - 10/10/16 1324      CC Long Term Goal  #1   Title Pt. will report at least 50% decrease in lower back pain   Baseline 25-30% improved as of 10/10/16   Status Partially Met     CC Long Term Goal  #2   Title Pt. will be independent in home exercise program for trunk flexibility and strengthening   Status Partially Met     CC Long Term Goal  #3   Title Pt. will show low back AROM not limited by more than 15% any direction for improved ADLs.   Status On-going     CC Long Term Goal  #4   Title Pt will report a plan for continuing exercise in the community after discharge    Status On-going            Plan - 10/10/16 1407    Clinical Impression Statement Pt. has partially met goals, but not fully met them. She still has trunk AROM limitations; she has less pain, but goal has not been met for reduction amount.  She was challenged with new exercises today, and wants to get a physioball to use at home.   Rehab Potential Good   Clinical Impairments Affecting Rehab Potential previous abdominal surgery    PT Frequency 2x / week   PT Duration 4 weeks   PT Treatment/Interventions ADLs/Self Care Home Management;Moist Heat;Cryotherapy;Therapeutic exercise;Patient/family education;Manual techniques;Scar mobilization;Passive range of motion;Taping;Electrical Stimulation   PT Next Visit Plan Try piriformis stretch, especially for right side; continue some of exercises tried today, including back extensions. Continue gradual progression of core stretching and strengthening exercises. Continue soft tissue work, gentle PA lumbar mobilizations.   PT Home Exercise Plan low  back stretches, supine scapular, neck tension stretches, hip flexor stretches, pelvic tilts   Consulted and Agree with Plan of Care Patient      Patient will benefit from skilled therapeutic intervention in order to improve the following deficits and impairments:  Pain, Decreased range of motion, Decreased strength  Visit Diagnosis: Bilateral low back pain, unspecified chronicity, with sciatica presence unspecified  Other symptoms and signs involving  the musculoskeletal system     Problem List Patient Active Problem List   Diagnosis Date Noted  . Abnormal uterine bleeding (AUB) 03/14/2016  . Iron deficiency anemia due to chronic blood loss 05/25/2015  . 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 Nausea with vomiting 05/11/2015  . Healed or old pulmonary embolism 03/31/2015  . Genetic testing 01/23/2015  . Insomnia 06/30/2014  . Neutropenia ([place]) 02/24/2014  . Neuropathy due to chemotherapeutic drug ([place]) 12/18/2013  . Hypoalbuminemia 12/18/2013  . Malfunction of device 12/18/2013  . Leg edema 12/18/2013  . Neoplasm related pain 12/18/2013  . Other pancytopenia ([place]) 12/13/2013  . Hyponatremia 12/12/2013  . Hypokalemia 12/12/2013  . Weight loss 12/04/2013  . PEG tube malfunction ([place]) 12/04/2013  . Dehiscence of incision 12/04/2013  . Abnormal loss of weight 12/04/2013  . Family history of cancer   . Cancer of antrum of stomach ([place]) 11/07/2013  . 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 Palliative care encounter 11/04/2013  . Nausea & vomiting 11/04/2013  . Abdominal pain, left lower quadrant 11/04/2013  . N&V (nausea and vomiting) 11/04/2013  . Acute kidney injury ([place]) 11/03/2013  . Chronic low back pain 10/31/2013  . Pain of left upper arm 10/28/2013  . 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 Constipation, chronic 10/28/2013  . Protein-calorie malnutrition, severe ([place]) 10/23/2013  . Anemia, unspecified 10/23/2013  . Intractable  nausea and vomiting 10/22/2013  . Intractable vomiting 10/22/2013  . Back pain 10/10/2013  . Malnutrition of moderate degree ([place]) 09/19/2013  . 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 Carcinoma of stomach ([place]) 09/18/2013  . Antral ulcer 09/16/2013  . Migraine headache 09/14/2013  . PTSD (post-traumatic stress disorder) 08/19/2013  . Left knee injury 07/30/2013  . Low back pain 07/30/2013  . Mitral regurgitation 02/15/2013  . Chest pain 01/23/2012  . Essential hypertension 01/23/2012  . Murmur 01/23/2012    Josefina Rynders 10/10/2016, 2:09 PM  [place], [place], [address] Phone: [phone]   Fax:  [phone]  Name: NARJIS MIRA MRN: [id number] Date of Birth: 07-28-1965  Serafina Royals, PT 10/10/16 2:09 PM

## 2016-10-11 ENCOUNTER — Encounter: Payer: Self-pay | Admitting: Physical Therapy

## 2016-10-11 ENCOUNTER — Telehealth: Payer: Self-pay | Admitting: *Deleted

## 2016-10-11 NOTE — Telephone Encounter (Signed)
Patient called stating that she has a sore under her left arm under the breast area.  It is so painful that it is hard for her to wear a bra.  Dr. Marin Olp notified.  Stated that patient should go to her primary care physician. Patient ok with this information

## 2016-10-12 ENCOUNTER — Ambulatory Visit: Payer: Medicare Other | Admitting: Physical Therapy

## 2016-10-12 DIAGNOSIS — M545 Low back pain: Secondary | ICD-10-CM

## 2016-10-12 DIAGNOSIS — R29898 Other symptoms and signs involving the musculoskeletal system: Secondary | ICD-10-CM

## 2016-10-12 NOTE — Therapy (Signed)
[place], [place], [address] Phone: [phone]   Fax:  [phone]  Physical Therapy Treatment  Patient Details  Name: Paula [name] MRN: [id number] Date of Birth: 10/28/1965 Referring Provider: Dr.Squire   Encounter Date: 10/12/2016      PT End of Session - 10/12/16 1701    Visit Number 12   Number of Visits 17   Date for PT Re-Evaluation 10/27/16   PT Start Time 9417   PT Stop Time 1347   PT Time Calculation (min) 42 min   Activity Tolerance Patient tolerated treatment well   Behavior During Therapy [facility] for tasks assessed/performed      Past Medical History:  Diagnosis Date  . Costochondritis   . Esophageal reflux    On omeprazole  . Family history of cancer   . Headache(784.0) 2011   Migraines  . Heart murmur   . Hypertension    started around 2011  . Iron deficiency anemia due to chronic blood loss 05/25/2015  . 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 On antineoplastic chemotherapy    5-FU and leucovorin  . Ovarian cyst   . Pneumonia   . Pulmonary emboli ([place])   . 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 ESOPHAGOGASTRODUODENOSCOPY N/A 09/16/2013   Procedure: ESOPHAGOGASTRODUODENOSCOPY (EGD);  Surgeon: Wonda Horner, MD;  Location: [facility];  Service: Endoscopy;  Laterality: N/A;  . LAPAROSCOPY N/A 09/20/2013   Procedure: DIAGNOSTIC LAPAROSCOPY,DISTAL GASTRECTOMY AND FEEDING GASTROJEJUNOSTOMY;  Surgeon: [name], MD;  Location: [facility];  Service: General;  Laterality: N/A;  . OOPHORECTOMY     Around 1985 left ovary removal  . PORTACATH PLACEMENT Left 11/18/2013   Procedure: INSERTION PORT-A-CATH;  Surgeon: [name], MD;  Location: [facility];  Service: General;  Laterality: Left;  . TONSILLECTOMY     Around 1994  . TUBAL LIGATION      There were no vitals filed for this visit.      Subjective Assessment - 10/12/16  1309    Subjective Slight increased right hip soreness after last session. Couldn't find a physioball yet, but has another place to try.   Currently in Pain? Yes   Pain Score 4    Pain Location Hip   Pain Orientation Right   Pain Descriptors / Indicators Sore   Aggravating Factors  new exercises?   Pain Relieving Factors medicine                         [facility] Adult PT Treatment/Exercise - 10/12/16 0001      Lumbar Exercises: Supine   Other Supine Lumbar Exercises feet on floor, small physioball on mat, patient leaned back over physioball for abdominal stretch x 30 seconds   Other Supine Lumbar Exercises figure 4 piriformis stretch using towel to pull legs, 10 second hold x 3 each side     Lumbar Exercises: Prone   Other Prone Lumbar Exercises prone on elbows x 30 seconds, then tried press-ups, but she reported left back cramps, so returned to prone on elbows x 1 minute.   Other Prone Lumbar Exercises child's pose stretch x 30 seconds x 2     Lumbar Exercises: Quadruped   Madcat/Old Horse 5 reps  holding each for 5 counts   Single Arm Raise Right;Left;2 seconds  over small physioball, 3 reps each side   Straight Leg Raise 2 seconds  over  small physioball, 3x each right and left     Manual Therapy   Soft tissue mobilization in prone with biotone to mid to lower back                        [place] - 10/10/16 1324      CC Long Term Goal  #1   Title Pt. will report at least 50% decrease in lower back pain   Baseline 25-30% improved as of 10/10/16   Status Partially Met     CC Long Term Goal  #2   Title Pt. will be independent in home exercise program for trunk flexibility and strengthening   Status Partially Met     CC Long Term Goal  #3   Title Pt. will show low back AROM not limited by more than 15% any direction for improved ADLs.   Status On-going     CC Long Term Goal  #4   Title Pt will report a plan for continuing  exercise in the community after discharge    Status On-going            Plan - 10/12/16 1333    Clinical Impression Statement She is making progress, but it is gradual. We have been able to try some new exercises.  She still benefits from soft tissue work to her mid-lower back, and she felt like today she didn't have the knots in her upper gluteal area that were noted at last session.   Rehab Potential Good   Clinical Impairments Affecting Rehab Potential previous abdominal surgery    PT Frequency 2x / week   PT Duration 4 weeks   PT Treatment/Interventions ADLs/Self Care Home Management;Moist Heat;Cryotherapy;Therapeutic exercise;Patient/family education;Manual techniques;Scar mobilization;Passive range of motion;Taping;Electrical Stimulation   PT Next Visit Plan try seated piriformis stretch and possibly add to HEP (either seated or supine); continue lumbar flexibility and core strengthening, including using physioball; continue PA mobs to lumbar spine and soft tissue work to lower back.   Consulted and Agree with Plan of Care Patient      Patient will benefit from skilled therapeutic intervention in order to improve the following deficits and impairments:  Pain, Decreased range of motion, Decreased strength  Visit Diagnosis: Bilateral low back pain, unspecified chronicity, with sciatica presence unspecified  Other symptoms and signs involving the musculoskeletal system     Problem List Patient Active Problem List   Diagnosis Date Noted  . Abnormal uterine bleeding (AUB) 03/14/2016  . Iron deficiency anemia due to chronic blood loss 05/25/2015  . 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 Nausea with vomiting 05/11/2015  . Healed or old pulmonary embolism 03/31/2015  . Genetic testing 01/23/2015  . Insomnia 06/30/2014  . Neutropenia ([place]) 02/24/2014  . Neuropathy due to chemotherapeutic drug ([place]) 12/18/2013  . Hypoalbuminemia 12/18/2013  . Malfunction of device 12/18/2013  .  Leg edema 12/18/2013  . Neoplasm related pain 12/18/2013  . Other pancytopenia ([place]) 12/13/2013  . Hyponatremia 12/12/2013  . Hypokalemia 12/12/2013  . Weight loss 12/04/2013  . PEG tube malfunction ([place]) 12/04/2013  . Dehiscence of incision 12/04/2013  . Abnormal loss of weight 12/04/2013  . Family history of cancer   . 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 Palliative care encounter 11/04/2013  . Nausea & vomiting 11/04/2013  . Abdominal pain, left lower quadrant 11/04/2013  .  N&V (nausea and vomiting) 11/04/2013  . Acute kidney injury ([place]) 11/03/2013  . Chronic low back pain 10/31/2013  . Pain of left upper arm 10/28/2013  . 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 Constipation, chronic 10/28/2013  . Protein-calorie malnutrition, severe ([place]) 10/23/2013  . Anemia, unspecified 10/23/2013  . Intractable nausea and vomiting 10/22/2013  . Intractable vomiting 10/22/2013  . Back pain 10/10/2013  . Malnutrition of moderate degree ([place]) 09/19/2013  . 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 Carcinoma of stomach ([place]) 09/18/2013  . Antral ulcer 09/16/2013  . Migraine headache 09/14/2013  . PTSD (post-traumatic stress disorder) 08/19/2013  . Left knee injury 07/30/2013  . Low back pain 07/30/2013  . Mitral regurgitation 02/15/2013  . Chest pain 01/23/2012  . Essential hypertension 01/23/2012  . Murmur 01/23/2012    SALISBURY,DONNA 10/12/2016, 5:06 PM  [place], [place], [address] Phone: [phone]   Fax:  [phone]  Name: Paula [name] MRN: [id number] Date of Birth: 03/29/1965  Serafina Royals, PT 10/12/16 5:06 PM

## 2016-10-17 ENCOUNTER — Ambulatory Visit: Payer: Medicare Other | Admitting: Physical Therapy

## 2016-10-17 DIAGNOSIS — M545 Low back pain: Secondary | ICD-10-CM

## 2016-10-17 DIAGNOSIS — R29898 Other symptoms and signs involving the musculoskeletal system: Secondary | ICD-10-CM
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
## 2016-10-17 NOTE — Patient Instructions (Signed)
Piriformis Stretch, Sitting    Sit, one ankle on opposite knee, same-side hand on crossed knee. Push down on knee, keeping spine straight. Lean torso forward, with flat back, until tension is felt in hamstrings and gluteals of crossed-leg side. Hold 30 seconds.  Repeat 2 times per session. Do 1-2 sessions per day.  Copyright  VHI. All rights reserved.   

## 2016-10-19 ENCOUNTER — Encounter: Payer: Self-pay | Admitting: Physical Therapy

## 2016-10-25 ENCOUNTER — Ambulatory Visit: Payer: Medicare Other | Attending: Radiation Oncology | Admitting: Physical Therapy

## 2016-10-25 ENCOUNTER — Other Ambulatory Visit: Payer: Self-pay | Admitting: *Deleted

## 2016-10-25 ENCOUNTER — Telehealth: Payer: Self-pay | Admitting: Obstetrics & Gynecology

## 2016-10-25 DIAGNOSIS — R29898 Other symptoms and signs involving the musculoskeletal system: Secondary | ICD-10-CM

## 2016-10-25 DIAGNOSIS — C169 Malignant neoplasm of stomach, unspecified: Secondary | ICD-10-CM

## 2016-10-25 DIAGNOSIS — M545 Low back pain: Secondary | ICD-10-CM | POA: Diagnosis present

## 2016-10-25 MED ORDER — HYDROCODONE-ACETAMINOPHEN 5-325 MG PO TABS: 1.0000 | ORAL_TABLET | Freq: Four times a day (QID) | ORAL | 0 refills | Status: DC | PRN

## 2016-10-25 NOTE — Telephone Encounter (Signed)
Patient state she has been bleeding since her last Depo inj.. Patient want to know if she can come in any sooner to get another Depo inj, she is also cramping. Patient would like a call back ASAP.

## 2016-10-25 NOTE — Therapy (Signed)
[place], [place], [address] Phone: [phone]   Fax:  [phone]  Physical Therapy Treatment  Patient Details  Name: Paula [name] MRN: [id number] Date of Birth: 07-Aug-1965 Referring Provider: Dr.Squire   Encounter Date: 10/25/2016      PT End of Session - 10/25/16 1500    Visit Number 14   Number of Visits 17   Date for PT Re-Evaluation 10/27/16   PT Start Time 8453   PT Stop Time 1345   PT Time Calculation (min) 42 min   Activity Tolerance Patient tolerated treatment well   Behavior During Therapy [facility] for tasks assessed/performed      Past Medical History:  Diagnosis Date  . Costochondritis   . Esophageal reflux    On omeprazole  . Family history of cancer   . Headache(784.0) 2011   Migraines  . Heart murmur   . Hypertension    started around 2011  . Iron deficiency anemia due to chronic blood loss 05/25/2015  . 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 On antineoplastic chemotherapy    5-FU and leucovorin  . Ovarian cyst   . Pneumonia   . Pulmonary emboli ([place])   . Stomach cancer ([place]) dx'd 08/2013    Past Surgical History:  Procedure Laterality Date  . ESOPHAGOGASTRODUODENOSCOPY N/A 09/16/2013   Procedure: ESOPHAGOGASTRODUODENOSCOPY (EGD);  Surgeon: Wonda Horner, MD;  Location: [facility];  Service: Endoscopy;  Laterality: N/A;  . LAPAROSCOPY N/A 09/20/2013   Procedure: DIAGNOSTIC LAPAROSCOPY,DISTAL GASTRECTOMY AND FEEDING GASTROJEJUNOSTOMY;  Surgeon: [name], MD;  Location: [facility];  Service: General;  Laterality: N/A;  . OOPHORECTOMY     Around 1985 left ovary removal  . PORTACATH PLACEMENT Left 11/18/2013   Procedure: INSERTION PORT-A-CATH;  Surgeon: [name], MD;  Location: [facility];  Service: General;  Laterality: Left;  . TONSILLECTOMY     Around 1994  . TUBAL LIGATION      There were no vitals filed for this visit.      Subjective Assessment - 10/25/16  1308    Subjective Pt has been doing meditation. She was threw up last night possibly from bad food and is still a little nauseous today.  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 Had partial resection of stomach 09/22/13, then adjuvant chemo (finished January 2016) and radiation for 8 weeks, completed 06/17/14. She had a CT scan of the abdomen and pelvis on 06/20/16 which showed no evidence of active disease. Continued follow-ups with Dr. Isidore Moos and Dr. Marin Olp.  Currently wearing a cardiac monitor because sometimes she feels fluttery, faint and lightheaded.  Has a heart murmur and a leaky valve.  Hyperlipidemia; recently tried a statin but came off it because it made her muscles sore.  Has had blood drained from left knee in the past about two+ years ago. Has been getting steroid shots for bilat. hip bursitis   Patient Stated Goals try to resolve the issue with my back pain or do some exercises that would ease the pain   Currently in Pain? Yes   Pain Score 4    Pain Location Abdomen  almost positive it is from the throwing up    Pain Orientation Lower  [facility] Adult PT Treatment/Exercise - 10/25/16 0001      Lumbar Exercises: Stretches   Lower Trunk Rotation 5 reps   Hip Flexor Stretch 2 reps;30 seconds  towel around opposite thigh to keep it pulled toward chest    Pelvic Tilt 5 reps   Quadruped Mid Back Stretch 3 reps;10 seconds   Piriformis Stretch 1 rep;30 seconds     Lumbar Exercises: Standing   Other Standing Lumbar Exercises one legged stance x 10 seconds on each leg      Lumbar Exercises: Seated   Hip Flexion on Ball AROM;Both;10 reps   Other Seated Lumbar Exercises seated on physioball:  pelvic motions forward-back and side to side; then circles with pelvis clockwise and counterclockwise x5 each way.   Other Seated Lumbar  Exercises arm raises on physioball , seated on mat with arms on ball for forward reaching and back stretch      Lumbar Exercises: Supine   Clam 10 reps  5 reps on each side    Bent Knee Raise 5 reps   Bridge 5 reps   Straight Leg Raise 5 reps   Straight Leg Raises Limitations both legs, squeeze, lift, lower, relax    Other Supine Lumbar Exercises pelvic tilt, knee to chest , straighten out and return      Knee/Hip Exercises: Supine   Bridges 5 reps   Straight Leg Raises Strengthening;Right;Left   Straight Leg Raises Limitations 4 part:  squeeze, lift, lower, relax    Other Supine Knee/Hip Exercises red theraband aournd knees in hooklying for 10 reps of hip abduction, then with theraband around ankles for 10 reps of hip abduction      Knee/Hip Exercises: Sidelying   Hip ABduction Strengthening;Right;Left;5 reps   Hip ABduction Limitations pt with difficulty with this    Clams 10 reps on each side                         Long Term Clinic Goals - 10/10/16 1324      CC Long Term Goal  #1   Title Pt. will report at least 50% decrease in lower back pain   Baseline 25-30% improved as of 10/10/16   Status Partially Met     CC Long Term Goal  #2   Title Pt. will be independent in home exercise program for trunk flexibility and strengthening   Status Partially Met     CC Long Term Goal  #3   Title Pt. will show low back AROM not limited by more than 15% any direction for improved ADLs.   Status On-going     CC Long Term Goal  #4   Title Pt will report a plan for continuing exercise in the community after discharge    Status On-going            Plan - 10/25/16 1501    Clinical Impression Statement Pt continues to have c/o pain, but is trying to do the exercise  She continues to have tightness in her low back and weak hip abductors bilaterally as well as generalized weakness. She is still trying to work on a community exercise program that she can continue moving  forward.    Rehab Potential Good   Clinical Impairments Affecting Rehab Potential previous abdominal surgery    PT Frequency 2x / week   PT Duration 4 weeks   PT Treatment/Interventions ADLs/Self Care Home Management;Moist Heat;Cryotherapy;Therapeutic exercise;Patient/family education;Manual techniques;Scar mobilization;Passive range of motion;Taping;Electrical  Stimulation   PT Next Visit Plan continue lumbar flexibility and core strengthening, including using physioball; continue PA mobs to lumbar spine and soft tissue work to lower back.  Work on hip abductor strength in supine, sidelying, standing and walking.  Renew or discharge next visit   Consulted and Agree with Plan of Care Patient      Patient will benefit from skilled therapeutic intervention in order to improve the following deficits and impairments:  Pain, Decreased range of motion, Decreased strength  Visit Diagnosis: Bilateral low back pain, unspecified chronicity, with sciatica presence unspecified  Other symptoms and signs involving the musculoskeletal system     Problem List Patient Active Problem List   Diagnosis Date Noted  . Abnormal uterine bleeding (AUB) 03/14/2016  . Iron deficiency anemia due to chronic blood loss 05/25/2015  . 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 Nausea with vomiting 05/11/2015  . Healed or old pulmonary embolism 03/31/2015  . Genetic testing 01/23/2015  . Insomnia 06/30/2014  . Neutropenia ([place]) 02/24/2014  . Neuropathy due to chemotherapeutic drug ([place]) 12/18/2013  . Hypoalbuminemia 12/18/2013  . Malfunction of device 12/18/2013  . Leg edema 12/18/2013  . Neoplasm related pain 12/18/2013  . Other pancytopenia ([place]) 12/13/2013  . Hyponatremia 12/12/2013  . Hypokalemia 12/12/2013  . Weight loss 12/04/2013  . PEG tube malfunction ([place]) 12/04/2013  . Dehiscence of incision 12/04/2013  . Abnormal loss of weight 12/04/2013  . Family history of cancer   . Cancer of antrum of stomach  ([place]) 11/07/2013  . 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 Palliative care encounter 11/04/2013  . Nausea & vomiting 11/04/2013  . Abdominal pain, left lower quadrant 11/04/2013  . N&V (nausea and vomiting) 11/04/2013  . Acute kidney injury ([place]) 11/03/2013  . Chronic low back pain 10/31/2013  . Pain of left upper arm 10/28/2013  . 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 Constipation, chronic 10/28/2013  . Protein-calorie malnutrition, severe ([place]) 10/23/2013  . Anemia, unspecified 10/23/2013  . Intractable nausea and vomiting 10/22/2013  . Intractable vomiting 10/22/2013  . Back pain 10/10/2013  . Malnutrition of moderate degree ([place]) 09/19/2013  . 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 Carcinoma of stomach ([place]) 09/18/2013  . Antral ulcer 09/16/2013  . Migraine headache 09/14/2013  . PTSD (post-traumatic stress disorder) 08/19/2013  . Left knee injury 07/30/2013  . Low back pain 07/30/2013  . Mitral regurgitation 02/15/2013  . Chest pain 01/23/2012  . Essential hypertension 01/23/2012  . Murmur 01/23/2012   Donato Heinz. Owens Shark PT  Norwood Levo 10/25/2016, 3:05 PM  [place], [place], [address] Phone: [phone]   Fax:  [phone]  Name: AMYRIE ILLINGWORTH MRN: [id number] Date of Birth: [birth year]

## 2016-10-26 ENCOUNTER — Ambulatory Visit (INDEPENDENT_AMBULATORY_CARE_PROVIDER_SITE_OTHER): Payer: Medicare Other

## 2016-10-26 VITALS — BP 117/80 | HR 82

## 2016-10-26 DIAGNOSIS — N924 Excessive bleeding in the premenopausal period: Secondary | ICD-10-CM

## 2016-10-26 DIAGNOSIS — N951 Menopausal and female climacteric states: Secondary | ICD-10-CM

## 2016-10-26 DIAGNOSIS — Z3042 Encounter for surveillance of injectable contraceptive: Secondary | ICD-10-CM

## 2016-10-26 NOTE — Progress Notes (Signed)
Patient presented to the office today for depo-provera. Patient is currently on different scheduled of receiving depo-provera per Dr.Anyuwa. Patient continues to complain of bleeding even after receiving her injection. She has been given a appointment for 11/02/2016 to discuss her bleeding.

## 2016-10-26 NOTE — Progress Notes (Signed)
Agree with nursing staff's documentation of this patient's clinic encounter.  Kerry Hough, PA-C

## 2016-10-27 ENCOUNTER — Ambulatory Visit: Payer: Medicare Other | Admitting: Physical Therapy

## 2016-10-27 DIAGNOSIS — M545 Low back pain: Secondary | ICD-10-CM | POA: Diagnosis not present

## 2016-10-27 DIAGNOSIS — R29898 Other symptoms and signs involving the musculoskeletal system: Secondary | ICD-10-CM

## 2016-10-27 NOTE — Therapy (Addendum)
[place], [place], [address] Phone: [phone]   Fax:  [phone]  Physical Therapy Treatment  Patient Details  Name: Paula [name] MRN: [id number] Date of Birth: [birth year] Referring Provider: Dr Isidore Moos   Encounter Date: 10/27/2016      PT End of Session - 10/27/16 1237    Visit Number 15   Number of Visits 25   Date for PT Re-Evaluation 11/28/16   PT Start Time 1105   PT Stop Time 1200   PT Time Calculation (min) 55 min   Activity Tolerance Patient tolerated treatment well   Behavior During Therapy [facility] for tasks assessed/performed      Past Medical History:  Diagnosis Date  . Costochondritis   . Esophageal reflux    On omeprazole  . Family history of cancer   . Headache(784.0) 2011   Migraines  . Heart murmur   . Hypertension    started around 2011  . Iron deficiency anemia due to chronic blood loss 05/25/2015  . 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 On antineoplastic chemotherapy    5-FU and leucovorin  . Ovarian cyst   . Pneumonia   . Pulmonary emboli ([place])   . Stomach cancer ([place]) dx'd 08/2013    Past Surgical History:  Procedure Laterality Date  . ESOPHAGOGASTRODUODENOSCOPY N/A 09/16/2013   Procedure: ESOPHAGOGASTRODUODENOSCOPY (EGD);  Surgeon: Wonda Horner, MD;  Location: [facility];  Service: Endoscopy;  Laterality: N/A;  . LAPAROSCOPY N/A 09/20/2013   Procedure: DIAGNOSTIC LAPAROSCOPY,DISTAL GASTRECTOMY AND FEEDING GASTROJEJUNOSTOMY;  Surgeon: [name], MD;  Location: [facility];  Service: General;  Laterality: N/A;  . OOPHORECTOMY     Around 1985 left ovary removal  . PORTACATH PLACEMENT Left 11/18/2013   Procedure: INSERTION PORT-A-CATH;  Surgeon: [name], MD;  [facility];  Service: General;  Laterality: Left;  . TONSILLECTOMY     Around 1994  . TUBAL LIGATION      There were no vitals filed for this visit.      Subjective Assessment - 10/27/16  1112    Subjective Pt states she has increased pain after last session that she had to go to bed and use a heating pad.  We did not do heat or massage last session and she feels that may be why she had increased pain  She is g1oing to see Dr. 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 Had partial resection of stomach 09/22/13, then adjuvant chemo (finished January 2016) and radiation for 8 weeks, completed 06/17/14. She had a CT scan of the abdomen and pelvis on 06/20/16 which showed no evidence of active disease. Continued follow-ups with Dr. Isidore Moos and Dr. Marin Olp.  Currently wearing a cardiac monitor because sometimes she feels fluttery, faint and lightheaded.  Has a heart murmur and a leaky valve.  Hyperlipidemia; recently tried a statin but came off it because it made her muscles sore.  Has had blood drained from left knee in the past about two+ years ago. Has been getting steroid shots for bilat. hip bursitis   Patient Stated Goals try to resolve the issue with my back pain or do some exercises that would ease the pain   Currently in Pain? No/denies            [facility] PT Assessment - 10/27/16 0001  Assessment   Medical Diagnosis h/o stomach cancer    Referring Provider Dr Isidore Moos    Onset Date/Surgical Date 09/22/13     Prior Function   Level of Independence Independent     AROM   Lumbar Flexion reaches approx. 12 inches fingertips to floor   Lumbar Extension [facility]   Lumbar - Right Side Bend [facility]   Lumbar - Left Side Bend [facility]   Lumbar - Right Rotation 15% loss   Lumbar - Left Rotation 15% loss     Palpation   Palpation comment tighness in lumbar paraspinals to palpation                      [facility] Adult PT Treatment/Exercise - 10/27/16 0001      Balance Poses: Yoga   Warrior I 2 reps   1 deep breath    Warrior II 2 reps  modified to  triangle pose     Neck Exercises: Standing   Other Standing Exercises standing neck and  trunk rotation to warm up and prepare for exercise      Lumbar Exercises: Stretches   Hip Flexor Stretch 5 reps;10 seconds     Lumbar Exercises: Seated   Other Seated Lumbar Exercises sitting on mat with hands on physioball, forward reaching to stretch low back      Lumbar Exercises: Supine   Clam 10 reps  5 reps on each side    Bent Knee Raise 10 reps   Straight Leg Raise 5 reps   Straight Leg Raises Limitations both legs, squeeze, lift, lower, relax    Other Supine Lumbar Exercises pelvic tilt, knee to chest , straighten out and return      Moist Heat Therapy   Number Minutes Moist Heat 10 Minutes   Moist Heat Location Lumbar Spine     Manual Therapy   Soft tissue mobilization in prone with biotone to mid to lower back                PT Education - 10/27/16 1145    Education provided Yes   Education Details warrior one and triangle pose    Person(s) Educated Patient   Methods Explanation;Demonstration;Verbal cues;Handout   Comprehension Verbalized understanding;Returned demonstration                Long Term Clinic Goals - 10/27/16 1116      CC Long Term Goal  #1   Title Pt. will report at least 50% decrease in lower back pain   Baseline 25-30% improved as of 10/10/16,  Pt states she is about 35% improved    Time 4   Period Weeks   Status On-going     CC Long Term Goal  #2   Title Pt. will be independent in home exercise program for trunk flexibility and strengthening   Status Achieved     CC Long Term Goal  #3   Title Pt. will show low back AROM not limited by more than 15% any direction for improved ADLs.   Time 4   Status On-going     CC Long Term Goal  #4   Title Pt will report a plan for continuing exercise in the community after discharge    Baseline has calls into [facility] for community exercise and yoga    Time 4   Period  Weeks   Status On-going            Plan -  10/27/16 1146    Clinical Impression Statement Pt had increased pain after exercise was increased last session.  Did not use theraband for resistance hip strengthening this session and she seemed to better. She continues to have tightness in low back and decreased lumbar flexion  Upgraded stretches to yoga poses that she can continue on at home and help transition to community exercise.  Renewal sent for 4 more weeks of therapy to continue to work on strategies to decrease pain and increase flexibilitya and strength    Rehab Potential Good   Clinical Impairments Affecting Rehab Potential previous abdominal surgery    PT Frequency 2x / week   PT Duration 4 weeks   PT Next Visit Plan continue lumbar flexibility and core strengthening, including using physioball; continue PA mobs to lumbar spine and soft tissue work to lower back.  Work on hip abductor strength in supine, sidelying, standing and walking using body weight resistance    PT Home Exercise Plan low back stretches, supine scapular, neck tension stretches, hip flexor stretches, pelvic tilts, seated piriformis stretches   Consulted and Agree with Plan of Care Patient      Patient will benefit from skilled therapeutic intervention in order to improve the following deficits and impairments:  Pain, Decreased range of motion, Decreased strength  Visit Diagnosis: Bilateral low back pain, unspecified chronicity, with sciatica presence unspecified - Plan: PT plan of care cert/re-cert  Other symptoms and signs involving the musculoskeletal system - Plan: PT plan of care cert/re-cert     Problem List Patient Active Problem List   Diagnosis Date Noted  . Abnormal uterine bleeding (AUB) 03/14/2016  . Iron deficiency anemia due to chronic blood loss 05/25/2015  . 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 Nausea with vomiting 05/11/2015  . Healed or old pulmonary embolism 03/31/2015  . Genetic  testing 01/23/2015  . Insomnia 06/30/2014  . Neutropenia ([place]) 02/24/2014  . Neuropathy due to chemotherapeutic drug ([place]) 12/18/2013  . Hypoalbuminemia 12/18/2013  . Malfunction of device 12/18/2013  . Leg edema 12/18/2013  . Neoplasm related pain 12/18/2013  . Other pancytopenia ([place]) 12/13/2013  . Hyponatremia 12/12/2013  . Hypokalemia 12/12/2013  . Weight loss 12/04/2013  . PEG tube malfunction ([place]) 12/04/2013  . Dehiscence of incision 12/04/2013  . Abnormal loss of weight 12/04/2013  . Family history of cancer   . Cancer of antrum of stomach ([place]) 11/07/2013  . 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 Palliative care encounter 11/04/2013  . Nausea & vomiting 11/04/2013  . Abdominal pain, left lower quadrant 11/04/2013  . N&V (nausea and vomiting) 11/04/2013  . Acute kidney injury ([place]) 11/03/2013  . Chronic low back pain 10/31/2013  . Pain of left upper arm 10/28/2013  . 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 Constipation, chronic 10/28/2013  . Protein-calorie malnutrition, severe ([place]) 10/23/2013  . Anemia, unspecified 10/23/2013  . Intractable nausea and vomiting 10/22/2013  . Intractable vomiting 10/22/2013  . Back pain 10/10/2013  . Malnutrition of moderate degree ([place]) 09/19/2013  . 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 Carcinoma of stomach ([place]) 09/18/2013  . Antral ulcer 09/16/2013  . Migraine headache 09/14/2013  . PTSD (post-traumatic stress disorder) 08/19/2013  . Left knee injury 07/30/2013  . Low back pain 07/30/2013  . Mitral regurgitation 02/15/2013  . Chest pain 01/23/2012  . Essential hypertension 01/23/2012  . Murmur 01/23/2012   Donato Heinz. Owens Shark, PT  Nor[place] Levo 10/27/2016,  12:45 PM  [place], [place], [address] Phone: [phone]   Fax:  [phone]  Name: Paula [name] MRN: [id number] Date of Birth: January 08, 1966 PHYSICAL THERAPY DISCHARGE SUMMARY  Visits from Start of Care: 15  Current functional level related to goals / functional outcomes: unknown   Remaining deficits: unknown   Education / Equipment: Home exercise  Plan: Patient agrees to discharge.  Patient goals were partially met. Patient is being discharged due to meeting the stated rehab goals.  ?????  Maudry Diego, PT 09/27/17 12:15 PM

## 2016-10-27 NOTE — Patient Instructions (Signed)
Warrior I Pose    In wide stride stance, feet facing forward, bend right knee and extend left leg behind, heel elevated. Distribute weight equally between front foot and ball of back foot. Extend arms upward beside ears. Hold for _3__ breaths. Repeat with other leg forward. Repeat _3__ times, alternating legs.  Copyright  VHI. All rights reserved.    Adductor Stretch, Standing: Financial controller (Chair)    Press into outer heels, rotate front knee to line up with toes. Feet 2-3 ft apart, heels aligned front to back, rotate at hips. Bottom hand on chair, reach top arm toward ceiling. Keep leg muscles engaged, knees neither hyper-extended nor wobbly. Hold for ____ breaths. Repeat ____ times each side.  Copyright  VHI. All rights reserved.

## 2016-10-31 NOTE — Progress Notes (Signed)
HPI: FU chest pain. Patient seen by [name] 6/18 with chest pain and palpitations. Patient had a nuclear study in [facility] October 2013. Her ejection fraction was 74% and no ischemia. Monitor June 2018 showed sinus to sinus tachycardia with rare PVC and brief run of SVT. Echocardiogram June 2018 showed normal LV function and mild mitral regurgitation. Since last seen, patient denies dyspnea or recurrent exertional chest pain. She has had some dizziness not related to palpitations. She has occasional brief flutters but no sustained palpitations.  Current Outpatient Prescriptions  Medication Sig Dispense Refill  . carvedilol (COREG) 6.25 MG tablet Take 6.25 mg by mouth 2 (two) times daily with a meal.    . HYDROcodone-acetaminophen (NORCO/VICODIN) 5-325 MG tablet Take 1 tablet by mouth every 6 (six) hours as needed for moderate pain. 60 tablet 0  . meloxicam (MOBIC) 15 MG tablet     . methscopolamine (PAMINE FORTE) 5 MG tablet   5  . Multiple Vitamins-Minerals (MULTIVITAMIN & MINERAL PO) Take 1 tablet by mouth daily.    Marland Kitchen omeprazole (PRILOSEC) 20 MG capsule Take 20 mg by mouth daily.  5  . ondansetron (ZOFRAN) 4 MG tablet Take 2 tablets (8 mg total) by mouth 2 (two) times daily. 60 tablet 3  . promethazine (PHENERGAN) 12.5 MG tablet Take 12.5 mg by mouth every 6 (six) hours as needed for nausea or vomiting.    . venlafaxine XR (EFFEXOR-XR) 37.5 MG 24 hr capsule   0  . methscopolamine (PAMINE FORTE) 5 MG tablet Take 5 mg by mouth every 6 (six) hours as needed.     Current Facility-Administered Medications  Medication Dose Route Frequency Provider Last Rate Last Dose  . medroxyPROGESTERone (DEPO-PROVERA) injection 150 mg  150 mg Intramuscular Q8 Weeks Anyanwu, Sallyanne Havers, MD   150 mg at 10/26/16 1527     Past Medical History:  Diagnosis Date  . Costochondritis   . Esophageal reflux    On omeprazole  . Family history of cancer   . Headache(784.0) 2011   Migraines  . Heart murmur     . Hypertension    started around 2011  . Iron deficiency anemia due to chronic blood loss 05/25/2015  . 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 On antineoplastic chemotherapy    5-FU and leucovorin  . Ovarian cyst   . Pneumonia   . Pulmonary emboli ([place])   . Stomach cancer ([place]) dx'd 08/2013    Past Surgical History:  Procedure Laterality Date  . ESOPHAGOGASTRODUODENOSCOPY N/A 09/16/2013   Procedure: ESOPHAGOGASTRODUODENOSCOPY (EGD);  Surgeon: Wonda Horner, MD;  Location: [facility];  Service: Endoscopy;  Laterality: N/A;  . LAPAROSCOPY N/A 09/20/2013   Procedure: DIAGNOSTIC LAPAROSCOPY,DISTAL GASTRECTOMY AND FEEDING GASTROJEJUNOSTOMY;  Surgeon: [name], MD;  Location: [facility];  Service: General;  Laterality: N/A;  . OOPHORECTOMY     Around 1985 left ovary removal  . PORTACATH PLACEMENT Left 11/18/2013   Procedure: INSERTION PORT-A-CATH;  Surgeon: [name], MD;  Location: [facility];  Service: General;  Laterality: Left;  . TONSILLECTOMY     Around 1994  . TUBAL LIGATION      Social History   Social History  . Marital status: Married    Spouse name: N/A  . Number of children: 2  . Years of education: N/A   Occupational History  .  [employer]   Social History Main Topics  . Smoking status: Former Smoker    Packs/day: 1.00  Years: 16.00    Quit date: 02/21/1998  . Smokeless tobacco: Never Used     Comment: 0.5-1.0 ppd for 16 yrs  . Alcohol use No     Comment: wine at dinner previously  . Drug use: No  . Sexual activity: Not Currently    Birth control/ protection: Surgical   Other Topics Concern  . Not on file   Social History Narrative  . No narrative on file    Family History  Problem Relation Age of Onset  . Hypertension Mother   . Diabetes Mellitus II Mother   . Other Mother        hx of hysterectomy for unspecified reason  . Hypertension Sister   . Hypertension Brother   . Heart failure Maternal Grandmother   . Heart Problems Maternal  Grandmother   . Hypertension Maternal Grandfather   . Diabetes Maternal Grandfather   . Colon cancer Paternal Uncle        dx. late 50s-early 60s  . Gastric cancer Cousin 35  . Colon cancer Cousin        dx. early-late 60s; (father also had colon cancer)  . Colon cancer Paternal Uncle        dx. late 50s-early 60s  . Lung cancer Paternal Uncle        dx. 71s; smoker  . Colon polyps Paternal Uncle        unknown number  . Heart attack Maternal Aunt   . Heart Problems Paternal Aunt   . Heart attack Paternal Grandfather   . Colon cancer Paternal Grandfather        dx. 50s-60s  . Colon polyps Brother        1 maternal half-brother had approx 3 polyps  . Breast cancer Cousin 50  . Breast cancer Cousin        dx. early 4s or earlier (father had colon cancer)  . Lung cancer Other        PGF's brothers (x4); dx. later in life; lim info    ROS: no fevers or chills, productive cough, hemoptysis, dysphasia, odynophagia, melena, hematochezia, dysuria, hematuria, rash, seizure activity, orthopnea, PND, pedal edema, claudication. Remaining systems are negative.  Physical Exam: Well-developed well-nourished in no acute distress.  Skin is warm and dry.  HEENT is normal.  Neck is supple.  Chest is clear to auscultation with normal expansion.  Cardiovascular exam is regular rate and rhythm.  Abdominal exam nontender or distended. No masses palpated. Extremities show no edema. neuro grossly intact   A/P  1 Atypical chest pain-previous nuclear study negative. No plans for further ischemia evaluation.  2 palpitations-monitor shows PACs, brief PAT and PVCs. Continue beta blocker.  3 hypertension-blood pressure is controlled. Continue present medications.  4 history of pulmonary embolus-continue xarelto.  5 Mitral regurgitation-mild on most recent echocardiogram.  Kirk Ruths, MD

## 2016-11-01 ENCOUNTER — Ambulatory Visit (HOSPITAL_BASED_OUTPATIENT_CLINIC_OR_DEPARTMENT_OTHER): Payer: Medicare Other | Admitting: Hematology & Oncology

## 2016-11-01 ENCOUNTER — Other Ambulatory Visit (HOSPITAL_BASED_OUTPATIENT_CLINIC_OR_DEPARTMENT_OTHER): Payer: Medicare Other

## 2016-11-01 VITALS — BP 117/71 | HR 67 | Temp 98.4°F | Resp 16 | Wt 153.0 lb

## 2016-11-01 DIAGNOSIS — C169 Malignant neoplasm of stomach, unspecified: Secondary | ICD-10-CM

## 2016-11-01 DIAGNOSIS — R109 Unspecified abdominal pain: Secondary | ICD-10-CM | POA: Diagnosis not present

## 2016-11-01 DIAGNOSIS — Z85038 Personal history of other malignant neoplasm of large intestine: Secondary | ICD-10-CM

## 2016-11-01 DIAGNOSIS — Z903 Acquired absence of stomach [part of]: Secondary | ICD-10-CM | POA: Diagnosis not present

## 2016-11-01 DIAGNOSIS — D500 Iron deficiency anemia secondary to blood loss (chronic): Secondary | ICD-10-CM | POA: Diagnosis not present

## 2016-11-01 LAB — CMP (CANCER CENTER ONLY)
ALT(SGPT): 16 U/L (ref 10–47)
AST: 19 U/L (ref 11–38)
Albumin: 3.4 g/dL (ref 3.3–5.5)
Alkaline Phosphatase: 67 U/L (ref 26–84)
BUN: 10 mg/dL (ref 7–22)
CHLORIDE: 106 meq/L (ref 98–108)
CO2: 28 meq/L (ref 18–33)
Calcium: 9 mg/dL (ref 8.0–10.3)
Creat: 1.1 mg/dl (ref 0.6–1.2)
GLUCOSE: 123 mg/dL — AB (ref 73–118)
POTASSIUM: 3.8 meq/L (ref 3.3–4.7)
Sodium: 138 mEq/L (ref 128–145)
Total Bilirubin: 0.8 mg/dl (ref 0.20–1.60)
Total Protein: 6.6 g/dL (ref 6.4–8.1)

## 2016-11-01 LAB — CBC WITH DIFFERENTIAL (CANCER CENTER ONLY)
BASO#: 0 10*3/uL (ref 0.0–0.2)
BASO%: 0.4 % (ref 0.0–2.0)
EOS ABS: 0.1 10*3/uL (ref 0.0–0.5)
EOS%: 1.5 % (ref 0.0–7.0)
HCT: 34.4 % — ABNORMAL LOW (ref 34.8–46.6)
HEMOGLOBIN: 11.1 g/dL — AB (ref 11.6–15.9)
LYMPH#: 1.2 10*3/uL (ref 0.9–3.3)
LYMPH%: 21.9 % (ref 14.0–48.0)
MCH: 29 pg (ref 26.0–34.0)
MCHC: 32.3 g/dL (ref 32.0–36.0)
MCV: 90 fL (ref 81–101)
MONO#: 0.4 10*3/uL (ref 0.1–0.9)
MONO%: 7.1 % (ref 0.0–13.0)
NEUT%: 69.1 % (ref 39.6–80.0)
NEUTROS ABS: 3.8 10*3/uL (ref 1.5–6.5)
PLATELETS: 246 10*3/uL (ref 145–400)
RBC: 3.83 10*6/uL (ref 3.70–5.32)
RDW: 13.5 % (ref 11.1–15.7)
WBC: 5.5 10*3/uL (ref 3.9–10.0)

## 2016-11-01 LAB — LACTATE DEHYDROGENASE: LDH: 169 U/L (ref 125–245)

## 2016-11-01 LAB — IRON AND TIBC
%SAT: 49 % (ref 21–57)
IRON: 107 ug/dL (ref 41–142)
TIBC: 220 ug/dL — ABNORMAL LOW (ref 236–444)
UIBC: 113 ug/dL — AB (ref 120–384)

## 2016-11-01 LAB — FERRITIN: FERRITIN: 105 ng/mL (ref 9–269)

## 2016-11-01 NOTE — Progress Notes (Signed)
Hematology and Oncology Follow Up Visit  Paula [name] [id number] Feb 21, 1966 51 y.o. 11/01/2016   Principle Diagnosis:  Stage IIA (T1a, N2, M0) gastric adenocarcinoma diagnosed in July of 2015  Current Therapy:   Observation    Prior Therapy:  1) Status post diagnostic laparoscopy, distal gastrectomy with billroth II anastamosis and feeding gastrojejunostomy tube under the care of Dr. Barry Dienes on 09/20/2013. 2) Adjuvant systemic chemotherapy with 5-FU 600 mg/M2 with leucovorin on days 1, 8, 15, 22, 29 and 36 every 8 weeks. 3) Adjuvant concurrent chemoradiation with Xeloda 650 mg/M2 by mouth twice a day for 5 days every week with radiation. The patient completed the course of radiotherapy but she declined to take Xeloda during her radiation.  Interim History:  [name] is here today for follow-up. She is feeling okay. She's having some more abdominal pain. She has some emesis over the weekend.  She's had no problems with bowels or bladder. She's not had any diarrhea.  There is no cough. She's had no shortness of breath. She's had no fever. She's had no bleeding.   There's been no headache.  She is gaining some weight. She thinks this is unusual given that she had part of her stomach removed. I told her that is not so much the stomach but the intestines that arms ordering a lot of nutrients.   She's had no problems with mouth sores.  Overall, her forms status is ECOG 1.    Medications:  Allergies as of 11/01/2016      Reactions   Tape Itching      Medication List       Accurate as of 11/01/16 10:48 AM. Always use your most recent med list.          carvedilol 6.25 MG tablet Commonly known as:  COREG Take 6.25 mg by mouth 2 (two) times daily with a meal.   chlorhexidine 0.12 % solution Commonly known as:  PERIDEX   HYDROcodone-acetaminophen 5-325 MG tablet Commonly known as:  NORCO/VICODIN Take 1 tablet by mouth every 6 (six) hours as needed for  moderate pain.   methscopolamine 5 MG tablet Commonly known as:  PAMINE FORTE Take 5 mg by mouth every 6 (six) hours as needed.   methscopolamine 5 MG tablet Commonly known as:  PAMINE FORTE   MULTIVITAMIN & MINERAL PO Take 1 tablet by mouth daily.   omeprazole 20 MG capsule Commonly known as:  PRILOSEC Take 20 mg by mouth daily.   ondansetron 4 MG tablet Commonly known as:  ZOFRAN Take 2 tablets (8 mg total) by mouth 2 (two) times daily.   promethazine 12.5 MG tablet Commonly known as:  PHENERGAN Take 12.5 mg by mouth every 6 (six) hours as needed for nausea or vomiting.       Allergies:  Allergies  Allergen Reactions  . Tape Itching    Past Medical History, Surgical history, Social history, and Family History were reviewed and updated.  Review of Systems: As stated in the interim history  Physical Exam:  weight is 153 lb (69.4 kg). Her oral temperature is 98.4 F (36.9 C). Her blood pressure is 117/71 and her pulse is 67. Her respiration is 16 and oxygen saturation is 100%.   Wt Readings from Last 3 Encounters:  11/01/16 153 lb (69.4 kg)  09/23/16 151 lb (68.5 kg)  08/17/16 146 lb 12.8 oz (66.6 kg)     Physical Exam  Constitutional: She is oriented to person, place, and time.  HENT:  Head: Normocephalic and atraumatic.  Mouth/Throat: Oropharynx is clear and moist.  Eyes: Pupils are equal, round, and reactive to light. EOM are normal.  Neck: Normal range of motion.  Cardiovascular: Normal rate, regular rhythm and normal heart sounds.   Pulmonary/Chest: Effort normal and breath sounds normal.  Abdominal: Soft. Bowel sounds are normal.  Musculoskeletal: Normal range of motion. She exhibits no edema, tenderness or deformity.  Lymphadenopathy:    She has no cervical adenopathy.  Neurological: She is alert and oriented to person, place, and time.  Skin: Skin is warm and dry. No rash noted. No erythema.  Psychiatric: She has a normal mood and affect. Her  behavior is normal. Judgment and thought content normal.  Vitals reviewed.    Lab Results  Component Value Date   WBC 5.5 11/01/2016   HGB 11.1 (L) 11/01/2016   HCT 34.4 (L) 11/01/2016   MCV 90 11/01/2016   PLT 246 11/01/2016   Lab Results  Component Value Date   FERRITIN 132 09/23/2016   IRON 76 09/23/2016   TIBC 249 09/23/2016   UIBC 173 09/23/2016   IRONPCTSAT 31 09/23/2016   Lab Results  Component Value Date   RBC 3.83 11/01/2016   No results found for: Nils Pyle [facility] Lab Results  Component Value Date   IGGSERUM 1,273 05/21/2015   IGMSERUM 40 05/21/2015   No results found for: Odetta Pink, SPEI   Chemistry      Component Value Date/Time   NA 138 11/01/2016 0947   NA 140 09/24/2015 0953   K 3.8 11/01/2016 0947   K 4.0 09/24/2015 0953   CL 106 11/01/2016 0947   CO2 28 11/01/2016 0947   CO2 23 09/24/2015 0953   BUN 10 11/01/2016 0947   BUN 8.6 09/24/2015 0953   CREATININE 1.1 11/01/2016 0947   CREATININE 1.0 09/24/2015 0953      Component Value Date/Time   CALCIUM 9.0 11/01/2016 0947   CALCIUM 9.6 09/24/2015 0953   ALKPHOS 67 11/01/2016 0947   ALKPHOS 99 09/24/2015 0953   AST 19 11/01/2016 0947   AST 17 09/24/2015 0953   ALT 16 11/01/2016 0947   ALT 11 09/24/2015 0953   BILITOT 0.80 11/01/2016 0947   BILITOT 0.83 09/24/2015 0953     Impression and Plan: [name] is a very nice 51 year old African-American female with past history of stage II gastric cancer. She underwent a partial gastrectomy followed by adjuvant chemotherapy and radiation therapy.   I'm not sure what might be going out of the abdominal pain. I think we probably should get some scans on her to see what is going on. I think this would be very reasonable. She did have stage II disease. She is at higher risk for recurrence.  We will get the scans when we see her back. I like to see her back in 3 months.  She is agreeable to the scans.    Volanda Napoleon, MD 9/11/201810:48 AM

## 2016-11-02 ENCOUNTER — Encounter: Payer: Self-pay | Admitting: Obstetrics and Gynecology

## 2016-11-02 ENCOUNTER — Ambulatory Visit (INDEPENDENT_AMBULATORY_CARE_PROVIDER_SITE_OTHER): Payer: Medicare Other | Admitting: Obstetrics and Gynecology

## 2016-11-02 ENCOUNTER — Telehealth: Payer: Self-pay | Admitting: *Deleted

## 2016-11-02 DIAGNOSIS — R6882 Decreased libido: Secondary | ICD-10-CM

## 2016-11-02 DIAGNOSIS — N939 Abnormal uterine and vaginal bleeding, unspecified: Secondary | ICD-10-CM

## 2016-11-02 DIAGNOSIS — Z23 Encounter for immunization: Secondary | ICD-10-CM | POA: Diagnosis not present
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
## 2016-11-02 NOTE — Progress Notes (Signed)
Patient is here for GYN visit, patient wants to discuss bleeding issues also her sexual drive.

## 2016-11-02 NOTE — Telephone Encounter (Addendum)
Patient is aware of results  ----- Message from Volanda Napoleon, MD sent at 11/02/2016  6:50 AM EDT ----- Call - the iron level is great!!!  pete

## 2016-11-04 ENCOUNTER — Encounter: Payer: Self-pay | Admitting: Cardiology

## 2016-11-04 ENCOUNTER — Ambulatory Visit (INDEPENDENT_AMBULATORY_CARE_PROVIDER_SITE_OTHER): Payer: Medicare Other | Admitting: Cardiology

## 2016-11-04 VITALS — BP 118/76 | HR 62 | Ht 60.0 in | Wt 153.0 lb

## 2016-11-04 DIAGNOSIS — I10 Essential (primary) hypertension: Secondary | ICD-10-CM

## 2016-11-04 DIAGNOSIS — R002 Palpitations: Secondary | ICD-10-CM

## 2016-11-04 DIAGNOSIS — R0789 Other chest pain: Secondary | ICD-10-CM

## 2016-11-04 NOTE — Patient Instructions (Signed)
Your physician wants you to follow-up in: [place] will receive a reminder letter in the mail two months in advance. If you don't receive a letter, please call our office to schedule the follow-up appointment.   If you need a refill on your cardiac medications before your next appointment, please call your pharmacy.

## 2016-11-14 ENCOUNTER — Encounter: Payer: Self-pay | Admitting: Family Medicine

## 2016-11-14 ENCOUNTER — Ambulatory Visit (INDEPENDENT_AMBULATORY_CARE_PROVIDER_SITE_OTHER): Payer: Medicare Other | Admitting: Family Medicine

## 2016-11-14 DIAGNOSIS — S8992XD Unspecified injury of left lower leg, subsequent encounter: Secondary | ICD-10-CM

## 2016-11-14 DIAGNOSIS — M25562 Pain in left knee: Secondary | ICD-10-CM | POA: Diagnosis not present

## 2016-11-14 NOTE — Patient Instructions (Addendum)
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 These are the different medications you can use for this: Tylenol 500mg  1-2 tabs three times a day for pain. Meloxicam, ibuprofen or aleve if needed for pain and inflammation. Some supplements that may help: Boswellia extract, curcumin, pycnogenol. Capsaicin, aspercreme, or biofreeze topically up to four times a day may also help with pain. Cortisone injections are an option - we gave you one in the pes bursa today. It's important that you continue to stay active. Straight leg raises, knee extensions, hamstring curls, hacky sack exercise 3 sets of 10 once a day (add ankle weight if these become too easy). Consider physical therapy to strengthen muscles around the joint that hurts to take pressure off of the joint itself though medicaid only lets you get one visit. Shoe inserts with good arch support may be helpful. Ice 15 minutes at a time 3-4 times a day as needed to help with pain. Knee sleeve for support and compression. Follow up with me in 6 weeks for reevaluation.

## 2016-11-15 DIAGNOSIS — M25562 Pain in left knee: Secondary | ICD-10-CM | POA: Diagnosis not present

## 2016-11-15 MED ORDER — METHYLPREDNISOLONE ACETATE 40 MG/ML IJ SUSP
40.0000 mg | Freq: Once | INTRAMUSCULAR | Status: AC
  Administered 2016-11-15: 40 mg via INTRA_ARTICULAR
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
## 2016-11-16 NOTE — Assessment & Plan Note (Signed)
Remote radiographs and CT negative.  Brief MSK u/s today shows a little pes bursitis in area of pain.  She only has some joint line tenderness consistent with mild flare of DJD also.  Injection into pes bursa given today.  Tylenol, meloxicam/ibuprofen/aleve if needed.  Shown home exercises to do daily.  Icing, knee sleeve for support.  F/u in 6 weeks.  After informed written consent timeout was performed, patient was seated on exam table. Left knee was prepped with alcohol swab over pes bursa in maximum area of pain and patient's left pes bursa was injected with 2:1 bupivicaine: depomedrol. Patient tolerated the procedure well without immediate complications.

## 2016-11-24 ENCOUNTER — Ambulatory Visit: Payer: Medicare Other

## 2016-11-30 ENCOUNTER — Other Ambulatory Visit: Payer: Self-pay | Admitting: *Deleted

## 2016-11-30 DIAGNOSIS — C169 Malignant neoplasm of stomach, unspecified: Secondary | ICD-10-CM

## 2016-11-30 MED ORDER — HYDROCODONE-ACETAMINOPHEN 5-325 MG PO TABS: 1.0000 | ORAL_TABLET | Freq: Four times a day (QID) | ORAL | 0 refills | Status: DC | PRN

## 2016-12-07 ENCOUNTER — Telehealth: Payer: Self-pay | Admitting: *Deleted

## 2016-12-07 NOTE — Telephone Encounter (Signed)
Patient would like to move her currently scheduled CT scans from December, to something in the near future. She states she is having more symptoms like abdominal cramping, knots, nausea and vomiting.   Reviewed with Dr Marin Olp and he is okay with patient having scan soon. Patient scans are not required to go through pre-certification. Patient can call radiology and reschedule scans.   Unable to reach patient, detailed message left on personal voice mail for patient to call [phone] to re-schedule the scans.

## 2016-12-14 ENCOUNTER — Encounter (HOSPITAL_BASED_OUTPATIENT_CLINIC_OR_DEPARTMENT_OTHER): Payer: Self-pay

## 2016-12-14 ENCOUNTER — Ambulatory Visit (HOSPITAL_BASED_OUTPATIENT_CLINIC_OR_DEPARTMENT_OTHER)
Admission: RE | Admit: 2016-12-14 | Discharge: 2016-12-14 | Disposition: A | Discharge status: Home / Self Care | Payer: Medicare Other | Source: Ambulatory Visit | Attending: Hematology & Oncology | Admitting: Hematology & Oncology

## 2016-12-14 DIAGNOSIS — D500 Iron deficiency anemia secondary to blood loss (chronic): Secondary | ICD-10-CM

## 2016-12-14 DIAGNOSIS — C169 Malignant neoplasm of stomach, unspecified: Secondary | ICD-10-CM

## 2016-12-14 MED ORDER — IOPAMIDOL (ISOVUE-300) INJECTION 61%
100.0000 mL | Freq: Once | INTRAVENOUS | Status: AC | PRN
  Administered 2016-12-14: 100 mL via INTRAVENOUS

## 2016-12-15 ENCOUNTER — Telehealth: Payer: Self-pay | Admitting: Obstetrics & Gynecology

## 2016-12-15 NOTE — Telephone Encounter (Signed)
Patient called to say she was bleeding, and wanted to know if she could get her depo injection sooner. Wanted to see Dr Harolyn Rutherford, but she is not here, so appointment has been made with Dr. Ihor Dow.

## 2016-12-16 ENCOUNTER — Telehealth: Payer: Self-pay

## 2016-12-16 ENCOUNTER — Telehealth: Payer: Self-pay | Admitting: Family Medicine

## 2016-12-16 NOTE — Telephone Encounter (Signed)
Pt called the front desk and requested a return call back.  Contacted pt and pt wanted to know if she could get her Depo Provera earlier due to her still having her flow.  Notified Dr. Kennon Rounds who gave the okay to administer the Depo Provera injection 1 week before her 8 week due date.  Informed pt and pt stated that she will be able to come in on Monday at 11am for early Depo Provera injection.

## 2016-12-16 NOTE — Telephone Encounter (Signed)
Patient called to say she has been flowing every since last week. She is wanting to get her depo injection sooner. Message given to [facility] RN who stated she will call patient back by the end of business day.

## 2016-12-19 ENCOUNTER — Telehealth: Payer: Self-pay | Admitting: *Deleted

## 2016-12-19 ENCOUNTER — Ambulatory Visit (INDEPENDENT_AMBULATORY_CARE_PROVIDER_SITE_OTHER): Payer: Medicare Other | Admitting: *Deleted

## 2016-12-19 VITALS — BP 114/72 | HR 75

## 2016-12-19 DIAGNOSIS — Z3042 Encounter for surveillance of injectable contraceptive: Secondary | ICD-10-CM

## 2016-12-19 DIAGNOSIS — N939 Abnormal uterine and vaginal bleeding, unspecified: Secondary | ICD-10-CM

## 2016-12-19 NOTE — Progress Notes (Signed)
Here for depo-provera. Given early per Dr. Kennon Rounds due to bleeding issues

## 2016-12-19 NOTE — Telephone Encounter (Addendum)
Patient aware of results  ----- Message from Volanda Napoleon, MD sent at 12/18/2016  1:57 PM EDT ----- Call =- the ct scan does not show any cancer!!!  pete

## 2016-12-19 NOTE — Progress Notes (Signed)
Patient seen and assessed by nursing staff.  Agree with documentation and plan.  

## 2016-12-23 NOTE — Telephone Encounter (Signed)
Per Dr. Kennon Rounds, pt was given Depo Provera 150 mg on 12/19/16 one week early.

## 2016-12-26 ENCOUNTER — Ambulatory Visit: Payer: Medicare Other | Admitting: Family Medicine

## 2016-12-29 ENCOUNTER — Other Ambulatory Visit: Payer: Medicare Other

## 2016-12-29 ENCOUNTER — Ambulatory Visit: Payer: Medicare Other | Admitting: Hematology & Oncology

## 2016-12-30 ENCOUNTER — Ambulatory Visit: Payer: Medicare Other | Admitting: Family Medicine

## 2017-01-04 ENCOUNTER — Ambulatory Visit: Payer: Medicare Other | Admitting: Obstetrics & Gynecology

## 2017-01-04 NOTE — Progress Notes (Signed)
Per provider pt can contact the front office to reschedule her appt.

## 2017-01-05 ENCOUNTER — Other Ambulatory Visit: Payer: Self-pay | Admitting: *Deleted

## 2017-01-05 DIAGNOSIS — C169 Malignant neoplasm of stomach, unspecified: Secondary | ICD-10-CM

## 2017-01-05 MED ORDER — HYDROCODONE-ACETAMINOPHEN 5-325 MG PO TABS: 1.0000 | ORAL_TABLET | Freq: Four times a day (QID) | ORAL | 0 refills | Status: DC | PRN

## 2017-01-17 ENCOUNTER — Telehealth: Payer: Self-pay | Admitting: Obstetrics & Gynecology

## 2017-01-17 NOTE — Telephone Encounter (Signed)
Patient state she starting bleeding she's not sure why but she would like to speak to a nurse

## 2017-01-26 ENCOUNTER — Other Ambulatory Visit: Payer: Self-pay

## 2017-01-26 ENCOUNTER — Other Ambulatory Visit (HOSPITAL_BASED_OUTPATIENT_CLINIC_OR_DEPARTMENT_OTHER): Payer: Medicare Other

## 2017-01-26 ENCOUNTER — Ambulatory Visit (HOSPITAL_BASED_OUTPATIENT_CLINIC_OR_DEPARTMENT_OTHER): Payer: Medicare Other | Admitting: Hematology & Oncology

## 2017-01-26 ENCOUNTER — Encounter: Payer: Self-pay | Admitting: Hematology & Oncology

## 2017-01-26 VITALS — BP 120/72 | HR 66 | Temp 98.8°F | Resp 16 | Wt 157.0 lb

## 2017-01-26 DIAGNOSIS — Z903 Acquired absence of stomach [part of]: Secondary | ICD-10-CM | POA: Diagnosis not present

## 2017-01-26 DIAGNOSIS — C169 Malignant neoplasm of stomach, unspecified: Secondary | ICD-10-CM

## 2017-01-26 DIAGNOSIS — Z85038 Personal history of other malignant neoplasm of large intestine: Secondary | ICD-10-CM

## 2017-01-26 DIAGNOSIS — D500 Iron deficiency anemia secondary to blood loss (chronic): Secondary | ICD-10-CM

## 2017-01-26 DIAGNOSIS — R1111 Vomiting without nausea: Secondary | ICD-10-CM

## 2017-01-26 LAB — CMP (CANCER CENTER ONLY)
ALK PHOS: 68 U/L (ref 26–84)
ALT(SGPT): 17 U/L (ref 10–47)
AST: 20 U/L (ref 11–38)
Albumin: 3.5 g/dL (ref 3.3–5.5)
BUN: 6 mg/dL — AB (ref 7–22)
CALCIUM: 8.9 mg/dL (ref 8.0–10.3)
CHLORIDE: 106 meq/L (ref 98–108)
CO2: 26 mEq/L (ref 18–33)
Creat: 1.1 mg/dl (ref 0.6–1.2)
GLUCOSE: 105 mg/dL (ref 73–118)
POTASSIUM: 3.5 meq/L (ref 3.3–4.7)
Sodium: 140 mEq/L (ref 128–145)
TOTAL PROTEIN: 6.8 g/dL (ref 6.4–8.1)
Total Bilirubin: 0.9 mg/dl (ref 0.20–1.60)

## 2017-01-26 LAB — CBC WITH DIFFERENTIAL (CANCER CENTER ONLY)
BASO#: 0 10*3/uL (ref 0.0–0.2)
BASO%: 0.4 % (ref 0.0–2.0)
EOS ABS: 0.1 10*3/uL (ref 0.0–0.5)
EOS%: 1.9 % (ref 0.0–7.0)
HEMATOCRIT: 36.3 % (ref 34.8–46.6)
HGB: 11.6 g/dL (ref 11.6–15.9)
LYMPH#: 1.5 10*3/uL (ref 0.9–3.3)
LYMPH%: 27.9 % (ref 14.0–48.0)
MCH: 28.9 pg (ref 26.0–34.0)
MCHC: 32 g/dL (ref 32.0–36.0)
MCV: 90 fL (ref 81–101)
MONO#: 0.4 10*3/uL (ref 0.1–0.9)
MONO%: 7.9 % (ref 0.0–13.0)
NEUT#: 3.3 10*3/uL (ref 1.5–6.5)
NEUT%: 61.9 % (ref 39.6–80.0)
PLATELETS: 119 10*3/uL — AB (ref 145–400)
RBC: 4.02 10*6/uL (ref 3.70–5.32)
RDW: 13.6 % (ref 11.1–15.7)
WBC: 5.3 10*3/uL (ref 3.9–10.0)

## 2017-01-26 LAB — LACTATE DEHYDROGENASE: LDH: 167 U/L (ref 125–245)

## 2017-01-26 MED ORDER — METOCLOPRAMIDE HCL 10 MG PO TABS: 10.0000 mg | ORAL_TABLET | Freq: Three times a day (TID) | ORAL | 3 refills | Status: DC

## 2017-01-26 NOTE — Progress Notes (Signed)
Hematology and Oncology Follow Up Visit  Paula [name] [id number] 02-09-1966 51 y.o. 01/26/2017   Principle Diagnosis:  Stage IIA (T1a, N2, M0) gastric adenocarcinoma diagnosed in July of 2015  Current Therapy:   Observation    Prior Therapy:  1) Status post diagnostic laparoscopy, distal gastrectomy with billroth II anastamosis and feeding gastrojejunostomy tube under the care of Dr. Barry Dienes on 09/20/2013. 2) Adjuvant systemic chemotherapy with 5-FU 600 mg/M2 with leucovorin on days 1, 8, 15, 22, 29 and 36 every 8 weeks. 3) Adjuvant concurrent chemoradiation with Xeloda 650 mg/M2 by mouth twice a day for 5 days every week with radiation. The patient completed the course of radiotherapy but she declined to take Xeloda during her radiation.  Interim History:  [name] is here today for follow-up.  Typically, we see her, she has problems.  Her problem now is that she is having vomiting every other day.  I am not sure as to why this is happening.  She had a CT scan done on October 24.  This did not show anything that was obvious.  There is no evidence that she has recurrence of her disease.  It is now been 3 years since she had surgery.  She apparently will see her gastroenterologist next week.  I would think that he will do at least a upper endoscopy.  When I listened to her abdomen today, I did not hear much in the way of bowel sounds.  As such, I will try her on some Reglan.  She will take 10 mg p.o. q. before meals and nightly.  She has had no bleeding.  There is been no diarrhea.  She has had no obvious abdominal pain.  She does take Vicodin for chronic pain issues.  She has had no cough or shortness of breath.  She has had no rashes.  She has had no leg swelling.  Overall, her performance status is ECOG 1.    Medications:  Allergies as of 01/26/2017      Reactions   Tape Itching      Medication List        Accurate as of 01/26/17 10:58 AM. Always use your  most recent med list.          carvedilol 6.25 MG tablet Commonly known as:  COREG Take 6.25 mg by mouth 2 (two) times daily with a meal.   HYDROcodone-acetaminophen 5-325 MG tablet Commonly known as:  NORCO/VICODIN Take 1 tablet every 6 (six) hours as needed by mouth for moderate pain.   meloxicam 15 MG tablet Commonly known as:  MOBIC   methscopolamine 5 MG tablet Commonly known as:  PAMINE FORTE   MULTIVITAMIN & MINERAL PO Take 1 tablet by mouth daily.   omeprazole 20 MG capsule Commonly known as:  PRILOSEC Take 20 mg by mouth daily.   ondansetron 4 MG tablet Commonly known as:  ZOFRAN Take 2 tablets (8 mg total) by mouth 2 (two) times daily.   polyethylene glycol powder powder Commonly known as:  GLYCOLAX/MIRALAX   promethazine 12.5 MG tablet Commonly known as:  PHENERGAN Take 12.5 mg by mouth every 6 (six) hours as needed for nausea or vomiting.       Allergies:  Allergies  Allergen Reactions  . Tape Itching    Past Medical History, Surgical history, Social history, and Family History were reviewed and updated.  Review of Systems: As stated in the interim history  Physical Exam:  weight is 157 lb (71.2  kg). Her oral temperature is 98.8 F (37.1 C). Her blood pressure is 120/72 and her pulse is 66. Her respiration is 16 and oxygen saturation is 100%.   Wt Readings from Last 3 Encounters:  01/26/17 157 lb (71.2 kg)  11/14/16 150 lb (68 kg)  11/04/16 153 lb (69.4 kg)     Physical Exam  Constitutional: She is oriented to person, place, and time.  HENT:  Head: Normocephalic and atraumatic.  Mouth/Throat: Oropharynx is clear and moist.  Eyes: EOM are normal. Pupils are equal, round, and reactive to light.  Neck: Normal range of motion.  Cardiovascular: Normal rate, regular rhythm and normal heart sounds.  Pulmonary/Chest: Effort normal and breath sounds normal.  Abdominal: Soft. Bowel sounds are normal.  Musculoskeletal: Normal range of motion.  She exhibits no edema, tenderness or deformity.  Lymphadenopathy:    She has no cervical adenopathy.  Neurological: She is alert and oriented to person, place, and time.  Skin: Skin is warm and dry. No rash noted. No erythema.  Psychiatric: She has a normal mood and affect. Her behavior is normal. Judgment and thought content normal.  Vitals reviewed.    Lab Results  Component Value Date   WBC 5.3 01/26/2017   HGB 11.6 01/26/2017   HCT 36.3 01/26/2017   MCV 90 01/26/2017   PLT 119 (L) 01/26/2017   Lab Results  Component Value Date   FERRITIN 105 11/01/2016   IRON 107 11/01/2016   TIBC 220 (L) 11/01/2016   UIBC 113 (L) 11/01/2016   IRONPCTSAT 49 11/01/2016   Lab Results  Component Value Date   RBC 4.02 01/26/2017   No results found for: Nils Pyle [facility] Lab Results  Component Value Date   IGGSERUM 1,273 05/21/2015   IGMSERUM 40 05/21/2015   No results found for: Odetta Pink, SPEI   Chemistry      Component Value Date/Time   NA 140 01/26/2017 1002   NA 140 09/24/2015 0953   K 3.5 01/26/2017 1002   K 4.0 09/24/2015 0953   CL 106 01/26/2017 1002   CO2 26 01/26/2017 1002   CO2 23 09/24/2015 0953   BUN 6 (L) 01/26/2017 1002   BUN 8.6 09/24/2015 0953   CREATININE 1.1 01/26/2017 1002   CREATININE 1.0 09/24/2015 0953      Component Value Date/Time   CALCIUM 8.9 01/26/2017 1002   CALCIUM 9.6 09/24/2015 0953   ALKPHOS 68 01/26/2017 1002   ALKPHOS 99 09/24/2015 0953   AST 20 01/26/2017 1002   AST 17 09/24/2015 0953   ALT 17 01/26/2017 1002   ALT 11 09/24/2015 0953   BILITOT 0.90 01/26/2017 1002   BILITOT 0.83 09/24/2015 0953     Impression and Plan: [name] is a very nice 51 year old African-American female with past history of stage II gastric cancer. She underwent a partial gastrectomy followed by adjuvant chemotherapy and radiation therapy.   We will see what her  gastroenterologist does.  Again he should hopefully do a upper endoscopy.  If not, she would at least need a gastric emptying scan.  Again when I listened to her, she had very few bowel sounds.  I see nothing with her blood work that would cause this.  I suspect that she may have scar tissue.  I do not know if she has adhesions.  She was incredibly generous and gave [place] a Christmas present.  I am very humbled by her generosity.  We will get her  back in 6 weeks to follow-up with her symptoms.   Volanda Napoleon, MD 12/6/201810:58 AM

## 2017-01-30 ENCOUNTER — Ambulatory Visit: Payer: Medicare Other | Admitting: Obstetrics & Gynecology

## 2017-02-06 ENCOUNTER — Other Ambulatory Visit: Payer: Self-pay | Admitting: *Deleted

## 2017-02-06 DIAGNOSIS — C169 Malignant neoplasm of stomach, unspecified: Secondary | ICD-10-CM

## 2017-02-06 MED ORDER — HYDROCODONE-ACETAMINOPHEN 5-325 MG PO TABS: 1.0000 | ORAL_TABLET | Freq: Four times a day (QID) | ORAL | 0 refills | Status: DC | PRN

## 2017-02-09 ENCOUNTER — Ambulatory Visit (INDEPENDENT_AMBULATORY_CARE_PROVIDER_SITE_OTHER): Payer: Medicare Other | Admitting: General Practice

## 2017-02-09 DIAGNOSIS — N924 Excessive bleeding in the premenopausal period: Secondary | ICD-10-CM | POA: Diagnosis not present

## 2017-02-09 DIAGNOSIS — Z3042 Encounter for surveillance of injectable contraceptive: Secondary | ICD-10-CM | POA: Diagnosis not present

## 2017-02-09 DIAGNOSIS — N939 Abnormal uterine and vaginal bleeding, unspecified: Secondary | ICD-10-CM

## 2017-02-09 NOTE — Progress Notes (Signed)
Agree with nursing staff's documentation of this patient's clinic encounter.  Verita Schneiders, MD 02/09/2017 4:12 PM

## 2017-02-09 NOTE — Progress Notes (Signed)
Depoprovera given

## 2017-02-15 ENCOUNTER — Ambulatory Visit: Payer: Medicare Other

## 2017-02-17 ENCOUNTER — Ambulatory Visit: Payer: Medicare Other | Admitting: Obstetrics & Gynecology

## 2017-03-09 ENCOUNTER — Inpatient Hospital Stay: Payer: Medicare Other

## 2017-03-09 ENCOUNTER — Other Ambulatory Visit: Payer: Self-pay | Admitting: *Deleted

## 2017-03-09 ENCOUNTER — Inpatient Hospital Stay: Payer: Medicare Other | Attending: Hematology & Oncology | Admitting: Hematology & Oncology

## 2017-03-09 VITALS — BP 111/69 | HR 72 | Temp 98.1°F | Resp 18 | Wt 160.0 lb

## 2017-03-09 DIAGNOSIS — Z85038 Personal history of other malignant neoplasm of large intestine: Secondary | ICD-10-CM

## 2017-03-09 DIAGNOSIS — R5383 Other fatigue: Secondary | ICD-10-CM

## 2017-03-09 DIAGNOSIS — C169 Malignant neoplasm of stomach, unspecified: Secondary | ICD-10-CM

## 2017-03-09 DIAGNOSIS — D500 Iron deficiency anemia secondary to blood loss (chronic): Secondary | ICD-10-CM | POA: Diagnosis not present

## 2017-03-09 MED ORDER — HYDROCODONE-ACETAMINOPHEN 5-325 MG PO TABS: 1.0000 | ORAL_TABLET | Freq: Four times a day (QID) | ORAL | 0 refills | Status: DC | PRN

## 2017-03-09 NOTE — Progress Notes (Signed)
Hematology and Oncology Follow Up Visit  Paula [name] [id number] April 22, 1965 52 y.o. 03/09/2017   Principle Diagnosis:  Stage IIA (T1a, N2, M0) gastric adenocarcinoma diagnosed in July of 2015  Current Therapy:   Observation    Prior Therapy:  1) Status post diagnostic laparoscopy, distal gastrectomy with billroth II anastamosis and feeding gastrojejunostomy tube under the care of Dr. Barry Dienes on 09/20/2013. 2) Adjuvant systemic chemotherapy with 5-FU 600 mg/M2 with leucovorin on days 1, 8, 15, 22, 29 and 36 every 8 weeks. 3) Adjuvant concurrent chemoradiation with Xeloda 650 mg/M2 by mouth twice a day for 5 days every week with radiation. The patient completed the course of radiotherapy but she declined to take Xeloda during her radiation.  Interim History:  [name] is here today for follow-up.  Unfortunately, we cannot get any blood work on her today.  She was stuck about 4 or 5 times.  As such, we talked about getting a Port-A-Cath put in.  She is very amenable to this.  She feels tired.  She made it through the holidays all right.  She goes for an endoscopy on February 11.  We last checked her iron studies back in September.  Her iron studies looked okay.  She had a ferritin of 105 with an iron saturation of 49%.  She has had no problems with diarrhea.  She had some constipation which is chronic.  There is no cough.  She has had no rashes.  Is been no leg swelling.  Overall, performance status is ECOG 1.  Medications:  Allergies as of 03/09/2017      Reactions   Tape Itching      Medication List        Accurate as of 03/09/17  3:45 PM. Always use your most recent med list.          carvedilol 6.25 MG tablet Commonly known as:  COREG Take 6.25 mg by mouth 2 (two) times daily with a meal.   HYDROcodone-acetaminophen 5-325 MG tablet Commonly known as:  NORCO/VICODIN Take 1 tablet by mouth every 6 (six) hours as needed for moderate pain.     meloxicam 15 MG tablet Commonly known as:  MOBIC   methscopolamine 5 MG tablet Commonly known as:  PAMINE FORTE   MULTIVITAMIN & MINERAL PO Take 1 tablet by mouth daily.   omeprazole 20 MG capsule Commonly known as:  PRILOSEC Take 20 mg by mouth daily.   ondansetron 4 MG tablet Commonly known as:  ZOFRAN Take 2 tablets (8 mg total) by mouth 2 (two) times daily.   polyethylene glycol powder powder Commonly known as:  GLYCOLAX/MIRALAX   promethazine 12.5 MG tablet Commonly known as:  PHENERGAN Take 12.5 mg by mouth every 6 (six) hours as needed for nausea or vomiting.       Allergies:  Allergies  Allergen Reactions  . Tape Itching    Past Medical History, Surgical history, Social history, and Family History were reviewed and updated.  Review of Systems: Review of Systems  Constitutional: Positive for malaise/fatigue.  HENT: Negative.   Eyes: Negative.   Respiratory: Positive for wheezing.   Cardiovascular: Positive for palpitations.  Gastrointestinal: Positive for nausea.  Genitourinary: Negative.   Musculoskeletal: Positive for joint pain.  Skin: Negative.   Neurological: Positive for headaches.  Endo/Heme/Allergies: Negative.   Psychiatric/Behavioral: Negative.     Physical Exam:  weight is 160 lb (72.6 kg). Her oral temperature is 98.1 F (36.7 C). Her blood pressure is  111/69 and her pulse is 72. Her respiration is 18 and oxygen saturation is 100%.   Wt Readings from Last 3 Encounters:  03/09/17 160 lb (72.6 kg)  02/09/17 156 lb (70.8 kg)  01/26/17 157 lb (71.2 kg)     Physical Exam  Constitutional: She is oriented to person, place, and time.  HENT:  Head: Normocephalic and atraumatic.  Mouth/Throat: Oropharynx is clear and moist.  Eyes: EOM are normal. Pupils are equal, round, and reactive to light.  Neck: Normal range of motion.  Cardiovascular: Normal rate, regular rhythm and normal heart sounds.  Pulmonary/Chest: Effort normal and breath  sounds normal.  Abdominal: Soft. Bowel sounds are normal.  Musculoskeletal: Normal range of motion. She exhibits no edema, tenderness or deformity.  Lymphadenopathy:    She has no cervical adenopathy.  Neurological: She is alert and oriented to person, place, and time.  Skin: Skin is warm and dry. No rash noted. No erythema.  Psychiatric: She has a normal mood and affect. Her behavior is normal. Judgment and thought content normal.  Vitals reviewed.    Lab Results  Component Value Date   WBC 5.3 01/26/2017   HGB 11.6 01/26/2017   HCT 36.3 01/26/2017   MCV 90 01/26/2017   PLT 119 (L) 01/26/2017   Lab Results  Component Value Date   FERRITIN 105 11/01/2016   IRON 107 11/01/2016   TIBC 220 (L) 11/01/2016   UIBC 113 (L) 11/01/2016   IRONPCTSAT 49 11/01/2016   Lab Results  Component Value Date   RBC 4.02 01/26/2017   No results found for: Nils Pyle [facility] Lab Results  Component Value Date   IGGSERUM 1,273 05/21/2015   IGMSERUM 40 05/21/2015   No results found for: Odetta Pink, SPEI   Chemistry      Component Value Date/Time   NA 140 01/26/2017 1002   NA 140 09/24/2015 0953   K 3.5 01/26/2017 1002   K 4.0 09/24/2015 0953   CL 106 01/26/2017 1002   CO2 26 01/26/2017 1002   CO2 23 09/24/2015 0953   BUN 6 (L) 01/26/2017 1002   BUN 8.6 09/24/2015 0953   CREATININE 1.1 01/26/2017 1002   CREATININE 1.0 09/24/2015 0953      Component Value Date/Time   CALCIUM 8.9 01/26/2017 1002   CALCIUM 9.6 09/24/2015 0953   ALKPHOS 68 01/26/2017 1002   ALKPHOS 99 09/24/2015 0953   AST 20 01/26/2017 1002   AST 17 09/24/2015 0953   ALT 17 01/26/2017 1002   ALT 11 09/24/2015 0953   BILITOT 0.90 01/26/2017 1002   BILITOT 0.83 09/24/2015 0953     Impression and Plan: [name] is a very nice 52 year old African-American female with past history of stage II gastric cancer. She underwent a partial  gastrectomy followed by adjuvant chemotherapy and radiation therapy.   We will go ahead and get a Port-A-Cath put into her.  She would like to have one done.  I think this will make life a lot easier for her.  We need to check her iron studies.  It is possible that she may be iron deficient which could be causing her symptoms of being fatigued.  I do suspect that she probably has scar tissue in the abdomen from her past treatments.  I would like to see her back in about 2 months.  He will be interesting to see what her endoscopy shows next month when she has it.   Rudell Cobb  Marin Olp, MD 1/17/20193:45 PM

## 2017-03-20 ENCOUNTER — Other Ambulatory Visit: Payer: Self-pay | Admitting: General Surgery

## 2017-03-20 ENCOUNTER — Other Ambulatory Visit: Payer: Self-pay | Admitting: Radiology

## 2017-03-21 ENCOUNTER — Ambulatory Visit (HOSPITAL_COMMUNITY)
Admission: RE | Admit: 2017-03-21 | Discharge: 2017-03-21 | Disposition: A | Discharge status: Home / Self Care | Payer: Medicare Other | Source: Ambulatory Visit | Attending: Hematology & Oncology | Admitting: Hematology & Oncology

## 2017-03-21 ENCOUNTER — Encounter (HOSPITAL_COMMUNITY): Payer: Self-pay

## 2017-03-21 ENCOUNTER — Other Ambulatory Visit: Payer: Self-pay | Admitting: Hematology & Oncology

## 2017-03-21 DIAGNOSIS — Z809 Family history of malignant neoplasm, unspecified: Secondary | ICD-10-CM | POA: Insufficient documentation

## 2017-03-21 DIAGNOSIS — Z923 Personal history of irradiation: Secondary | ICD-10-CM | POA: Insufficient documentation

## 2017-03-21 DIAGNOSIS — Z791 Long term (current) use of non-steroidal anti-inflammatories (NSAID): Secondary | ICD-10-CM | POA: Insufficient documentation

## 2017-03-21 DIAGNOSIS — I10 Essential (primary) hypertension: Secondary | ICD-10-CM | POA: Insufficient documentation

## 2017-03-21 DIAGNOSIS — Z79899 Other long term (current) drug therapy: Secondary | ICD-10-CM | POA: Diagnosis not present

## 2017-03-21 DIAGNOSIS — C169 Malignant neoplasm of stomach, unspecified: Secondary | ICD-10-CM

## 2017-03-21 DIAGNOSIS — Z86711 Personal history of pulmonary embolism: Secondary | ICD-10-CM | POA: Insufficient documentation

## 2017-03-21 DIAGNOSIS — Z85028 Personal history of other malignant neoplasm of stomach: Secondary | ICD-10-CM | POA: Insufficient documentation

## 2017-03-21 DIAGNOSIS — K219 Gastro-esophageal reflux disease without esophagitis: Secondary | ICD-10-CM | POA: Diagnosis not present

## 2017-03-21 DIAGNOSIS — Z9221 Personal history of antineoplastic chemotherapy: Secondary | ICD-10-CM | POA: Diagnosis not present

## 2017-03-21 HISTORY — PX: IR FLUORO GUIDE PORT INSERTION RIGHT: IMG5741

## 2017-03-21 HISTORY — PX: IR US GUIDE VASC ACCESS RIGHT: IMG2390

## 2017-03-21 LAB — CBC
HEMATOCRIT: 37.7 % (ref 36.0–46.0)
Hemoglobin: 12.1 g/dL (ref 12.0–15.0)
MCH: 28.9 pg (ref 26.0–34.0)
MCHC: 32.1 g/dL (ref 30.0–36.0)
MCV: 90 fL (ref 78.0–100.0)
Platelets: 318 10*3/uL (ref 150–400)
RBC: 4.19 MIL/uL (ref 3.87–5.11)
RDW: 14.5 % (ref 11.5–15.5)
WBC: 5.8 10*3/uL (ref 4.0–10.5)

## 2017-03-21 LAB — PROTIME-INR
INR: 0.99
Prothrombin Time: 13 seconds (ref 11.4–15.2)

## 2017-03-21 MED ORDER — FENTANYL CITRATE (PF) 100 MCG/2ML IJ SOLN
INTRAMUSCULAR | Status: AC
  Filled 2017-03-21: qty 2

## 2017-03-21 MED ORDER — FENTANYL CITRATE (PF) 100 MCG/2ML IJ SOLN
INTRAMUSCULAR | Status: AC | PRN
  Administered 2017-03-21: 50 ug via INTRAVENOUS

## 2017-03-21 MED ORDER — SODIUM CHLORIDE 0.9 % IV SOLN
INTRAVENOUS | Status: DC
  Administered 2017-03-21: 08:00:00 via INTRAVENOUS

## 2017-03-21 MED ORDER — LIDOCAINE HCL 1 % IJ SOLN
INTRAMUSCULAR | Status: AC | PRN
  Administered 2017-03-21: 20 mL

## 2017-03-21 MED ORDER — HEPARIN SOD (PORK) LOCK FLUSH 100 UNIT/ML IV SOLN
INTRAVENOUS | Status: AC
  Filled 2017-03-21: qty 5

## 2017-03-21 MED ORDER — LIDOCAINE HCL 1 % IJ SOLN
INTRAMUSCULAR | Status: AC | PRN
  Administered 2017-03-21: 10 mL

## 2017-03-21 MED ORDER — CEFAZOLIN SODIUM-DEXTROSE 2-4 GM/100ML-% IV SOLN
INTRAVENOUS | Status: AC
  Administered 2017-03-21: 2 g via INTRAVENOUS
  Filled 2017-03-21: qty 100

## 2017-03-21 MED ORDER — CEFAZOLIN SODIUM-DEXTROSE 2-4 GM/100ML-% IV SOLN
2.0000 g | INTRAVENOUS | Status: AC
  Administered 2017-03-21: 2 g via INTRAVENOUS

## 2017-03-21 MED ORDER — LIDOCAINE HCL 1 % IJ SOLN
INTRAMUSCULAR | Status: AC
  Filled 2017-03-21: qty 20

## 2017-03-21 MED ORDER — MIDAZOLAM HCL 2 MG/2ML IJ SOLN
INTRAMUSCULAR | Status: AC | PRN
  Administered 2017-03-21 (×3): 1 mg via INTRAVENOUS

## 2017-03-21 MED ORDER — MIDAZOLAM HCL 2 MG/2ML IJ SOLN
INTRAMUSCULAR | Status: AC
  Filled 2017-03-21: qty 4

## 2017-03-21 NOTE — Discharge Instructions (Signed)
Implanted Port Home Guide °An implanted port is a type of central line that is placed under the skin. Central lines are used to provide IV access when treatment or nutrition needs to be given through a person’s veins. Implanted ports are used for long-term IV access. An implanted port may be placed because: °· You need IV medicine that would be irritating to the small veins in your hands or arms. °· You need long-term IV medicines, such as antibiotics. °· You need IV nutrition for a long period. °· You need frequent blood draws for lab tests. °· You need dialysis. ° °Implanted ports are usually placed in the chest area, but they can also be placed in the upper arm, the abdomen, or the leg. An implanted port has two main parts: °· Reservoir. The reservoir is round and will appear as a small, raised area under your skin. The reservoir is the part where a needle is inserted to give medicines or draw blood. °· Catheter. The catheter is a thin, flexible tube that extends from the reservoir. The catheter is placed into a large vein. Medicine that is inserted into the reservoir goes into the catheter and then into the vein. ° °How will I care for my incision site? °Do not get the incision site wet. Bathe or shower as directed by your health care provider. °How is my port accessed? °Special steps must be taken to access the port: °· Before the port is accessed, a numbing cream can be placed on the skin. This helps numb the skin over the port site. °· Your health care provider uses a sterile technique to access the port. °? Your health care provider must put on a mask and sterile gloves. °? The skin over your port is cleaned carefully with an antiseptic and allowed to dry. °? The port is gently pinched between sterile gloves, and a needle is inserted into the port. °· Only "non-coring" port needles should be used to access the port. Once the port is accessed, a blood return should be checked. This helps ensure that the port  is in the vein and is not clogged. °· If your port needs to remain accessed for a constant infusion, a clear (transparent) bandage will be placed over the needle site. The bandage and needle will need to be changed every week, or as directed by your health care provider. °· Keep the bandage covering the needle clean and dry. Do not get it wet. Follow your health care provider’s instructions on how to take a shower or bath while the port is accessed. °· If your port does not need to stay accessed, no bandage is needed over the port. ° °What is flushing? °Flushing helps keep the port from getting clogged. Follow your health care provider’s instructions on how and when to flush the port. Ports are usually flushed with saline solution or a medicine called heparin. The need for flushing will depend on how the port is used. °· If the port is used for intermittent medicines or blood draws, the port will need to be flushed: °? After medicines have been given. °? After blood has been drawn. °? As part of routine maintenance. °· If a constant infusion is running, the port may not need to be flushed. ° °How long will my port stay implanted? °The port can stay in for as long as your health care provider thinks it is needed. When it is time for the port to come out, surgery will be   done to remove it. The procedure is similar to the one performed when the port was put in. °When should I seek immediate medical care? °When you have an implanted port, you should seek immediate medical care if: °· You notice a bad smell coming from the incision site. °· You have swelling, redness, or drainage at the incision site. °· You have more swelling or pain at the port site or the surrounding area. °· You have a fever that is not controlled with medicine. ° °This information is not intended to replace advice given to you by your health care provider. Make sure you discuss any questions you have with your health care provider. °Document  Released: 02/07/2005 Document Revised: 07/16/2015 Document Reviewed: 10/15/2012 °Elsevier Interactive Patient Education © 2017 [employer] °Moderate Conscious Sedation, Adult, Care After °These instructions provide you with information about caring for yourself after your procedure. Your health care provider may also give you more specific instructions. Your treatment has been planned according to current medical practices, but problems sometimes occur. Call your health care provider if you have any problems or questions after your procedure. °What can I expect after the procedure? °After your procedure, it is common: °· To feel sleepy for several hours. °· To feel clumsy and have poor balance for several hours. °· To have poor judgment for several hours. °· To vomit if you eat too soon. ° °Follow these instructions at home: °For at least 24 hours after the procedure: ° °· Do not: °? Participate in activities where you could fall or become injured. °? Drive. °? Use heavy machinery. °? Drink alcohol. °? Take sleeping pills or medicines that cause drowsiness. °? Make important decisions or sign legal documents. °? Take care of children on your own. °· Rest. °Eating and drinking °· Follow the diet recommended by your health care provider. °· If you vomit: °? Drink water, juice, or soup when you can drink without vomiting. °? Make sure you have little or no nausea before eating solid foods. °General instructions °· Have a responsible adult stay with you until you are awake and alert. °· Take over-the-counter and prescription medicines only as told by your health care provider. °· If you smoke, do not smoke without supervision. °· Keep all follow-up visits as told by your health care provider. This is important. °Contact a health care provider if: °· You keep feeling nauseous or you keep vomiting. °· You feel light-headed. °· You develop a rash. °· You have a fever. °Get help right away if: °· You have trouble  breathing. °This information is not intended to replace advice given to you by your health care provider. Make sure you discuss any questions you have with your health care provider. °Document Released: 11/28/2012 Document Revised: 07/13/2015 Document Reviewed: 05/30/2015 °Elsevier Interactive Patient Education © 2018 [employer] ° °

## 2017-03-21 NOTE — Procedures (Signed)
Interventional Radiology Procedure Note  Procedure: Single Lumen Power Port Placement    Access:  Right IJ vein.  Findings: Catheter tip positioned at SVC/RA junction. Port is ready for immediate use.   Complications: None  EBL: < 10 mL  Recommendations:  - Ok to shower in 24 hours - Do not submerge for 7 days - Routine line care   Pearlie Nies T. Finnleigh Marchetti, M.D Pager:  [phone]   

## 2017-03-21 NOTE — H&P (Signed)
Referring Physician(s): Ennever,Peter R  Supervising Physician: Aletta Edouard  Patient Status:  [facility]  Chief Complaint:  "I'm getting a port"  Subjective: Patient familiar to IR service from prior Port-A-Cath placement 2017, since removed.  She has a history of gastric cancer initially diagnosed in 2015, status post surgery and chemoradiation. She has poor venous access and presents again today for new Port-A-Cath placement for blood draws and needed infusions.  She currently denies fever, headache, chest pain, dyspnea, cough, abdominal pain, nausea, vomiting or bleeding.  She does have occasional back pain and fatigue.  Past Medical History:  Diagnosis Date  . Costochondritis   . Esophageal reflux    On omeprazole  . Family history of cancer   . Headache(784.0) 2011   Migraines  . Heart murmur   . Hypertension    started around 2011  . Iron deficiency anemia due to chronic blood loss 05/25/2015  . 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 On antineoplastic chemotherapy    5-FU and leucovorin  . Ovarian cyst   . Pneumonia   . Pulmonary emboli ([place])   . Stomach cancer ([place]) dx'd 08/2013   Past Surgical History:  Procedure Laterality Date  . ESOPHAGOGASTRODUODENOSCOPY N/A 09/16/2013   Procedure: ESOPHAGOGASTRODUODENOSCOPY (EGD);  Surgeon: Wonda Horner, MD;  Location: [facility];  Service: Endoscopy;  Laterality: N/A;  . LAPAROSCOPY N/A 09/20/2013   Procedure: DIAGNOSTIC LAPAROSCOPY,DISTAL GASTRECTOMY AND FEEDING GASTROJEJUNOSTOMY;  Surgeon: [name], MD;  Location: [facility];  Service: General;  Laterality: N/A;  . OOPHORECTOMY     Around 1985 left ovary removal  . PORTACATH PLACEMENT Left 11/18/2013   Procedure: INSERTION PORT-A-CATH;  Surgeon: [name], MD;  Location: [facility];  Service: General;  Laterality: Left;  . TONSILLECTOMY     Around 1994  . TUBAL LIGATION       Allergies: Tape  Medications: Prior to Admission medications   Medication Sig Start Date End  Date Taking? Authorizing Provider  carvedilol (COREG) 6.25 MG tablet Take 6.25 mg by mouth 2 (two) times daily with a meal.   Yes [provider]  HYDROcodone-acetaminophen (NORCO/VICODIN) 5-325 MG tablet Take 1 tablet by mouth every 6 (six) hours as needed for moderate pain. 03/09/17  Yes Ennever, Rudell Cobb, MD  Multiple Vitamins-Minerals (MULTIVITAMIN & MINERAL PO) Take 1 tablet by mouth daily.   Yes [provider]  omeprazole (PRILOSEC) 20 MG capsule Take 20 mg by mouth daily. 05/11/15  Yes [provider]  ondansetron (ZOFRAN) 4 MG tablet Take 2 tablets (8 mg total) by mouth 2 (two) times daily. 07/13/15  Yes Volanda Napoleon, MD  polyethylene glycol powder (GLYCOLAX/MIRALAX) powder  01/04/17  Yes [provider]  promethazine (PHENERGAN) 12.5 MG tablet Take 12.5 mg by mouth every 6 (six) hours as needed for nausea or vomiting.   Yes [provider]  meloxicam (MOBIC) 15 MG tablet  10/31/16   [provider]  methscopolamine (PAMINE FORTE) 5 MG tablet  09/02/16   [provider]  pantoprazole (PROTONIX) 40 MG tablet Take 1 tablet (40 mg total) by mouth 2 (two) times daily before a meal. Patient not taking: Reported on 07/18/2014 01/10/14 09/16/14  Curt Bears, MD  temazepam (RESTORIL) 30 MG capsule Take 1 capsule (30 mg total) by mouth at bedtime as needed for sleep. Patient not taking: Reported on 07/18/2014 06/30/14 09/16/14  Curt Bears, MD     Vital Signs: BP 111/78 (BP Location: Right Arm)   Pulse 73  Temp 97.8 F (36.6 C) (Oral)   Resp 16   SpO2 100%   Physical Exam awake, alert.  Chest clear to auscultation bilaterally.  Heart with regular rate and rhythm.  Abdomen soft, positive bowel sounds, nontender.  No lower extremity edema.  Imaging: No results found.  Labs:  CBC: Recent Labs    09/23/16 1359 11/01/16 0947 01/26/17 1002 03/21/17 0742  WBC 5.3 5.5 5.3 5.8  HGB 12.2 11.1* 11.6 12.1  HCT 37.1 34.4*  36.3 37.7  PLT 160 246 119* 318    COAGS: Recent Labs    03/21/17 0742  INR 0.99    BMP: Recent Labs    04/01/16 1321 06/20/16 2103 08/08/16 1404 09/23/16 1359 11/01/16 0947 01/26/17 1002  NA 135 135 130* 133 138 140  K 3.8 4.1 3.9 4.0 3.8 3.5  CL 106 101 101 110* 106 106  CO2 26 26 24 29 28 26   GLUCOSE 96 90 135* 108 123* 105  BUN 11 10 11 8 10  6*  CALCIUM 8.9 9.0 8.9 9.2 9.0 8.9  CREATININE 0.87 0.89 0.98 1.3* 1.1 1.1  GFRNONAA 78 >60 68  --   --   --   GFRAA 90 >60 78  --   --   --     LIVER FUNCTION TESTS: Recent Labs    08/08/16 1404 09/23/16 1359 11/01/16 0947 01/26/17 1002  BILITOT 0.5 0.50 0.80 0.90  AST 11 26 19 20   ALT 8 19 16 17   ALKPHOS 75 75 67 68  PROT 6.9 6.9 6.6 6.8  ALBUMIN 4.0 3.5 3.4 3.5    Assessment and Plan: Pt with history of gastric cancer initially diagnosed in 2015, status post surgery and chemoradiation. She has poor venous access and presents today for Port-A-Cath placement for blood draws and needed infusions. Risks and benefits discussed with the patient including, but not limited to bleeding, infection, pneumothorax, or fibrin sheath development and need for additional procedures. All of the patient's questions were answered, patient is agreeable to proceed. Consent signed and in chart.     Electronically Signed: D. Rowe Robert, PA-C 03/21/2017, 9:04 AM   I spent a total of 20 minutes at the the patient's bedside AND on the patient's hospital floor or unit, greater than 50% of which was counseling/coordinating care for Port-A-Cath placement

## 2017-03-29 ENCOUNTER — Ambulatory Visit (INDEPENDENT_AMBULATORY_CARE_PROVIDER_SITE_OTHER): Payer: Medicare Other

## 2017-03-29 ENCOUNTER — Telehealth: Payer: Self-pay | Admitting: *Deleted

## 2017-03-29 VITALS — BP 120/60 | HR 70

## 2017-03-29 DIAGNOSIS — Z3042 Encounter for surveillance of injectable contraceptive: Secondary | ICD-10-CM | POA: Diagnosis not present

## 2017-03-29 NOTE — Progress Notes (Signed)
Patient presented to IR department today for evaluation of recently placed right chest wall Port-A-Cath.  Patient states port site has been bothering her since placement.  She reports no fever, chills.  On exam today there is a small amount of bruising to the right of the port pocket.  There is no significant erythema, edema or drainage at site.  Site is mildly tender to palpation and soft to touch.  Patient was also seen by Dr. Laurence Ferrari who recommended short-term ibuprofen (600 mg every 8 hours for the next 5 days) and warm compresses to site.  If pain persists despite these measures she was told to contact our service for further evaluation.  Port site is okay to access if needed.  Once bruising has resolved pain should diminish.

## 2017-03-29 NOTE — Progress Notes (Signed)
Patient presented to the office for a Depo injection.

## 2017-03-29 NOTE — Telephone Encounter (Signed)
Pt called requesting to receive Depo Provera injection today. She stated that she started bleeding yesterday and is changing pad q3-5 hrs. Per chart review, pt is to receive Depo Provera q8 weeks. Her last injection was 7 weeks ago on 02/09/17 and the previous 2 injections were also 7 weeks apart rather than 8 weeks. Her last visit w/provider was 11/02/16. She DNKA on 01/04/17 and 02/17/17 for GYN follow up. After consulting with Dr. Hulan Fray, pt was advised that she may have injection today. She will need to see a provider within the next 6 weeks for evaluation and updated plan of care.

## 2017-03-30 NOTE — Progress Notes (Signed)
I have reviewed the chart and agree with nursing staff's documentation of this patient's encounter.  Mora Bellman, MD 03/30/2017 7:25 AM

## 2017-04-10 ENCOUNTER — Other Ambulatory Visit: Payer: Self-pay | Admitting: *Deleted

## 2017-04-10 DIAGNOSIS — C169 Malignant neoplasm of stomach, unspecified: Secondary | ICD-10-CM

## 2017-04-10 MED ORDER — HYDROCODONE-ACETAMINOPHEN 5-325 MG PO TABS: 1.0000 | ORAL_TABLET | Freq: Four times a day (QID) | ORAL | 0 refills | Status: DC | PRN

## 2017-04-13 ENCOUNTER — Inpatient Hospital Stay: Payer: Medicare Other | Attending: Hematology & Oncology

## 2017-04-13 ENCOUNTER — Encounter: Payer: Self-pay | Admitting: Hematology & Oncology

## 2017-04-13 ENCOUNTER — Inpatient Hospital Stay (HOSPITAL_BASED_OUTPATIENT_CLINIC_OR_DEPARTMENT_OTHER): Payer: Medicare Other | Admitting: Hematology & Oncology

## 2017-04-13 ENCOUNTER — Other Ambulatory Visit: Payer: Self-pay

## 2017-04-13 VITALS — BP 110/70 | HR 78 | Temp 98.7°F | Resp 16 | Wt 161.0 lb

## 2017-04-13 DIAGNOSIS — Z85038 Personal history of other malignant neoplasm of large intestine: Secondary | ICD-10-CM | POA: Insufficient documentation

## 2017-04-13 DIAGNOSIS — C169 Malignant neoplasm of stomach, unspecified: Secondary | ICD-10-CM

## 2017-04-13 DIAGNOSIS — D509 Iron deficiency anemia, unspecified: Secondary | ICD-10-CM

## 2017-04-13 DIAGNOSIS — D500 Iron deficiency anemia secondary to blood loss (chronic): Secondary | ICD-10-CM

## 2017-04-13 DIAGNOSIS — M545 Low back pain: Secondary | ICD-10-CM

## 2017-04-13 DIAGNOSIS — G8929 Other chronic pain: Secondary | ICD-10-CM

## 2017-04-13 DIAGNOSIS — Z903 Acquired absence of stomach [part of]: Secondary | ICD-10-CM | POA: Diagnosis not present

## 2017-04-13 LAB — CBC WITH DIFFERENTIAL (CANCER CENTER ONLY)
BASOS PCT: 0 %
Basophils Absolute: 0 10*3/uL (ref 0.0–0.1)
Eosinophils Absolute: 0.1 10*3/uL (ref 0.0–0.5)
Eosinophils Relative: 1 %
HEMATOCRIT: 37.3 % (ref 34.8–46.6)
HEMOGLOBIN: 12 g/dL (ref 11.6–15.9)
LYMPHS ABS: 1.5 10*3/uL (ref 0.9–3.3)
LYMPHS PCT: 25 %
MCH: 29.1 pg (ref 26.0–34.0)
MCHC: 32.2 g/dL (ref 32.0–36.0)
MCV: 90.3 fL (ref 81.0–101.0)
Monocytes Absolute: 0.5 10*3/uL (ref 0.1–0.9)
Monocytes Relative: 9 %
NEUTROS ABS: 3.7 10*3/uL (ref 1.5–6.5)
NEUTROS PCT: 65 %
Platelet Count: 270 10*3/uL (ref 145–400)
RBC: 4.13 MIL/uL (ref 3.70–5.32)
RDW: 14.1 % (ref 11.1–15.7)
WBC Count: 5.8 10*3/uL (ref 3.9–10.0)

## 2017-04-13 LAB — CMP (CANCER CENTER ONLY)
ALBUMIN: 3.8 g/dL (ref 3.5–5.0)
ALK PHOS: 70 U/L (ref 26–84)
ALT: 14 U/L (ref 10–47)
ANION GAP: 5 (ref 5–15)
AST: 18 U/L (ref 11–38)
BILIRUBIN TOTAL: 0.7 mg/dL (ref 0.2–1.6)
BUN: 10 mg/dL (ref 7–22)
CALCIUM: 9.1 mg/dL (ref 8.0–10.3)
CO2: 26 mmol/L (ref 18–33)
CREATININE: 1.1 mg/dL (ref 0.60–1.20)
Chloride: 110 mmol/L — ABNORMAL HIGH (ref 98–108)
Glucose, Bld: 105 mg/dL (ref 73–118)
Potassium: 3.6 mmol/L (ref 3.3–4.7)
Sodium: 141 mmol/L (ref 128–145)
Total Protein: 7.4 g/dL (ref 6.4–8.1)

## 2017-04-13 MED ORDER — LIDOCAINE-PRILOCAINE 2.5-2.5 % EX CREA: 1.0000 "application " | TOPICAL_CREAM | CUTANEOUS | 6 refills | Status: DC | PRN

## 2017-04-13 NOTE — Progress Notes (Signed)
Hematology and Oncology Follow Up Visit  Paula [name] [id number] 13-Nov-1965 52 y.o. 04/13/2017   Principle Diagnosis:  Stage IIA (T1a, N2, M0) gastric adenocarcinoma diagnosed in July of 2015 Intermittent iron deficiency anemia  Current Therapy:   IV iron as indicated    Prior Therapy:  1) Status post diagnostic laparoscopy, distal gastrectomy with billroth II anastamosis and feeding gastrojejunostomy tube under the care of Dr. Barry Dienes on 09/20/2013. 2) Adjuvant systemic chemotherapy with 5-FU 600 mg/M2 with leucovorin on days 1, 8, 15, 22, 29 and 36 every 8 weeks. 3) Adjuvant concurrent chemoradiation with Xeloda 650 mg/M2 by mouth twice a day for 5 days every week with radiation. The patient completed the course of radiotherapy but she declined to take Xeloda during her radiation.  Interim History:  Paula [name] is here today for follow-up.  She has a Port-A-Cath in now.  This will make life a lot easier.  She, unfortunately, still needed to have a peripheral blood draw today because she was not given EMLA cream after having the Port-A-Cath placed.  She did have some problems with nausea and vomiting past weekend.  She took Phenergan for this.  This did seem to help..  She has some occasional back discomfort.  I suspect this probably is from her abdominal surgery and scar tissue that is built up.  She is had no bleeding.  There is been no cough.  She had no shortness of breath.  Overall, her performance status is ECOG 1.     Medications:  Allergies as of 04/13/2017      Reactions   Tape Itching      Medication List        Accurate as of 04/13/17  1:56 PM. Always use your most recent med list.          carvedilol 6.25 MG tablet Commonly known as:  COREG Take 6.25 mg by mouth 2 (two) times daily with a meal.   HYDROcodone-acetaminophen 5-325 MG tablet Commonly known as:  NORCO/VICODIN Take 1 tablet by mouth every 6 (six) hours as needed for moderate  pain.   methscopolamine 5 MG tablet Commonly known as:  PAMINE FORTE   MULTIVITAMIN & MINERAL PO Take 1 tablet by mouth daily.   omeprazole 20 MG capsule Commonly known as:  PRILOSEC Take 20 mg by mouth daily.   ondansetron 4 MG tablet Commonly known as:  ZOFRAN Take 2 tablets (8 mg total) by mouth 2 (two) times daily.   polyethylene glycol powder powder Commonly known as:  GLYCOLAX/MIRALAX   promethazine 12.5 MG tablet Commonly known as:  PHENERGAN Take 12.5 mg by mouth every 6 (six) hours as needed for nausea or vomiting.       Allergies:  Allergies  Allergen Reactions  . Tape Itching    Past Medical History, Surgical history, Social history, and Family History were reviewed and updated.  Review of Systems: Review of Systems  Constitutional: Positive for malaise/fatigue.  HENT: Negative.   Eyes: Negative.   Respiratory: Positive for wheezing.   Cardiovascular: Positive for palpitations.  Gastrointestinal: Positive for nausea.  Genitourinary: Negative.   Musculoskeletal: Positive for joint pain.  Skin: Negative.   Neurological: Positive for headaches.  Endo/Heme/Allergies: Negative.   Psychiatric/Behavioral: Negative.     Physical Exam:  weight is 161 lb (73 kg). Her oral temperature is 98.7 F (37.1 C). Her blood pressure is 110/70 and her pulse is 78. Her respiration is 16 and oxygen saturation is 100%.  Wt Readings from Last 3 Encounters:  04/13/17 161 lb (73 kg)  03/09/17 160 lb (72.6 kg)  02/09/17 156 lb (70.8 kg)     Physical Exam  Constitutional: She is oriented to person, place, and time.  HENT:  Head: Normocephalic and atraumatic.  Mouth/Throat: Oropharynx is clear and moist.  Eyes: EOM are normal. Pupils are equal, round, and reactive to light.  Neck: Normal range of motion.  Cardiovascular: Normal rate, regular rhythm and normal heart sounds.  Pulmonary/Chest: Effort normal and breath sounds normal.  Abdominal: Soft. Bowel sounds are  normal.  Musculoskeletal: Normal range of motion. She exhibits no edema, tenderness or deformity.  Lymphadenopathy:    She has no cervical adenopathy.  Neurological: She is alert and oriented to person, place, and time.  Skin: Skin is warm and dry. No rash noted. No erythema.  Psychiatric: She has a normal mood and affect. Her behavior is normal. Judgment and thought content normal.  Vitals reviewed.    Lab Results  Component Value Date   WBC 5.8 04/13/2017   HGB 12.1 03/21/2017   HCT 37.3 04/13/2017   MCV 90.3 04/13/2017   PLT 270 04/13/2017   Lab Results  Component Value Date   FERRITIN 105 11/01/2016   IRON 107 11/01/2016   TIBC 220 (L) 11/01/2016   UIBC 113 (L) 11/01/2016   IRONPCTSAT 49 11/01/2016   Lab Results  Component Value Date   RBC 4.13 04/13/2017   No results found for: [facility] Lab Results  Component Value Date   IGGSERUM 1,273 05/21/2015   IGMSERUM 40 05/21/2015   No results found for: Odetta Pink, SPEI   Chemistry      Component Value Date/Time   NA 141 04/13/2017 1158   NA 140 01/26/2017 1002   NA 140 09/24/2015 0953   K 3.6 04/13/2017 1158   K 3.5 01/26/2017 1002   K 4.0 09/24/2015 0953   CL 110 (H) 04/13/2017 1158   CL 106 01/26/2017 1002   CO2 26 04/13/2017 1158   CO2 26 01/26/2017 1002   CO2 23 09/24/2015 0953   BUN 10 04/13/2017 1158   BUN 6 (L) 01/26/2017 1002   BUN 8.6 09/24/2015 0953   CREATININE 1.10 04/13/2017 1158   CREATININE 1.1 01/26/2017 1002   CREATININE 1.0 09/24/2015 0953      Component Value Date/Time   CALCIUM 9.1 04/13/2017 1158   CALCIUM 8.9 01/26/2017 1002   CALCIUM 9.6 09/24/2015 0953   ALKPHOS 70 04/13/2017 1158   ALKPHOS 68 01/26/2017 1002   ALKPHOS 99 09/24/2015 0953   AST 18 04/13/2017 1158   AST 17 09/24/2015 0953   ALT 14 04/13/2017 1158   ALT 17 01/26/2017 1002   ALT 11 09/24/2015 0953   BILITOT 0.7 04/13/2017 1158     BILITOT 0.83 09/24/2015 0953     Impression and Plan: Paula [name] is a very nice 52 year old African-American female with past history of stage II gastric cancer. She underwent a partial gastrectomy followed by adjuvant chemotherapy and radiation therapy.   I went ahead and called in the EMLA cream prescription for her.  She will need to have her Port-A-Cath flushed every 6 weeks.  We will see what her iron studies are.  I will see her back in 3 months.  Volanda Napoleon, MD 2/21/20191:56 PM

## 2017-04-14 ENCOUNTER — Telehealth: Payer: Self-pay | Admitting: *Deleted

## 2017-04-14 LAB — IRON AND TIBC
Iron: 87 ug/dL (ref 41–142)
Saturation Ratios: 37 % (ref 21–57)
TIBC: 238 ug/dL (ref 236–444)
UIBC: 150 ug/dL

## 2017-04-14 LAB — FERRITIN: FERRITIN: 93 ng/mL (ref 9–269)

## 2017-04-14 NOTE — Telephone Encounter (Addendum)
Patient is aware of results.   ----- Message from Volanda Napoleon, MD sent at 04/14/2017  1:44 PM EST ----- Call - iron level is ok!!  Laurey Arrow

## 2017-04-14 NOTE — Addendum Note (Signed)
Addended by: Burney Gauze R on: 04/14/2017 03:29 PM   Modules accepted: Orders

## 2017-04-22 ENCOUNTER — Ambulatory Visit (HOSPITAL_BASED_OUTPATIENT_CLINIC_OR_DEPARTMENT_OTHER): Payer: Medicare Other

## 2017-04-25 ENCOUNTER — Encounter: Payer: Self-pay | Admitting: Family Medicine

## 2017-04-25 ENCOUNTER — Ambulatory Visit (INDEPENDENT_AMBULATORY_CARE_PROVIDER_SITE_OTHER): Payer: Medicare Other | Admitting: Family Medicine

## 2017-04-25 DIAGNOSIS — G8929 Other chronic pain: Secondary | ICD-10-CM | POA: Diagnosis not present

## 2017-04-25 DIAGNOSIS — M25562 Pain in left knee: Secondary | ICD-10-CM | POA: Insufficient documentation

## 2017-04-25 MED ORDER — METHYLPREDNISOLONE ACETATE 40 MG/ML IJ SUSP
40.0000 mg | Freq: Once | INTRAMUSCULAR | Status: AC
  Administered 2017-04-25: 40 mg via INTRA_ARTICULAR

## 2017-04-25 NOTE — Patient Instructions (Signed)
Your pain is due to arthritis. These are the different medications you can take for this: Tylenol 500mg  1-2 tabs three times a day for pain. Capsaicin, aspercreme, or biofreeze topically up to four times a day may also help with pain. Some supplements that may help for arthritis: Boswellia extract, curcumin, pycnogenol Aleve 1-2 tabs twice a day with food as needed. Cortisone injections are an option - you were given this today. If cortisone injections do not help, there are different types of shots that may help but they take longer to take effect. It's important that you continue to stay active. Straight leg raises, knee extensions 3 sets of 10 once a day (add ankle weight if these become too easy). Consider physical therapy to strengthen muscles around the joint that hurts to take pressure off of the joint itself. Shoe inserts with good arch support may be helpful. Heat or ice 15 minutes at a time 3-4 times a day as needed to help with pain. Water aerobics and cycling with low resistance are the best two types of exercise for arthritis though any exercise is ok as long as it doesn't worsen the pain. Follow up with me in 6 weeks.

## 2017-04-25 NOTE — Assessment & Plan Note (Signed)
consistent with flare of mild DJD currently.  No pes tenderness, this has improved since last visit.  Discussed tylenol, aleve, topical medications, supplements that may help.  Injection given today.  Reviewed home exercises that should help as well.  F/u in 6 weeks.  After informed written consent timeout was performed, patient was seated on exam table. Left knee was prepped with alcohol swab and utilizing anteromedial approach, patient's left knee was injected intraarticularly with 3:1 bupivicaine: depomedrol. Patient tolerated the procedure well without immediate complications.
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
## 2017-04-27 ENCOUNTER — Encounter: Payer: Self-pay | Admitting: Obstetrics & Gynecology

## 2017-04-27 ENCOUNTER — Ambulatory Visit (INDEPENDENT_AMBULATORY_CARE_PROVIDER_SITE_OTHER): Payer: Medicare Other | Admitting: Obstetrics & Gynecology

## 2017-04-27 VITALS — BP 128/88 | HR 81 | Wt 161.0 lb

## 2017-04-27 DIAGNOSIS — Z3042 Encounter for surveillance of injectable contraceptive: Secondary | ICD-10-CM

## 2017-04-27 DIAGNOSIS — N939 Abnormal uterine and vaginal bleeding, unspecified: Secondary | ICD-10-CM | POA: Diagnosis not present

## 2017-04-27 MED ORDER — MEDROXYPROGESTERONE ACETATE 150 MG/ML IM SUSP
150.0000 mg | Freq: Once | INTRAMUSCULAR | Status: AC
  Administered 2017-04-27: 150 mg via INTRAMUSCULAR

## 2017-04-27 NOTE — Patient Instructions (Signed)
Endometrial Ablation Endometrial ablation is a procedure that destroys the thin inner layer of the lining of the uterus (endometrium). This procedure may be done:  To stop heavy periods.  To stop bleeding that is causing anemia.  To control irregular bleeding.  To treat bleeding caused by small tumors (fibroids) in the endometrium.  This procedure is often an alternative to major surgery, such as removal of the uterus and cervix (hysterectomy). As a result of this procedure:  You may not be able to have children. However, if you are premenopausal (you have not gone through menopause): ? You may still have a small chance of getting pregnant. ? You will need to use a reliable method of birth control after the procedure to prevent pregnancy.  You may stop having a menstrual period, or you may have only a small amount of bleeding during your period. Menstruation may return several years after the procedure.  Tell a health care provider about:  Any allergies you have.  All medicines you are taking, including vitamins, herbs, eye drops, creams, and over-the-counter medicines.  Any problems you or family members have had with the use of anesthetic medicines.  Any blood disorders you have.  Any surgeries you have had.  Any medical conditions you have. What are the risks? Generally, this is a safe procedure. However, problems may occur, including:  A hole (perforation) in the uterus or bowel.  Infection of the uterus, bladder, or vagina.  Bleeding.  Damage to other structures or organs.  An air bubble in the lung (air embolus).  Problems with pregnancy after the procedure.  Failure of the procedure.  Decreased ability to diagnose cancer in the endometrium.  What happens before the procedure?  You will have tests of your endometrium to make sure there are no pre-cancerous cells or cancer cells present.  You may have an ultrasound of the uterus.  You may be given  medicines to thin the endometrium.  Ask your health care provider about: ? Changing or stopping your regular medicines. This is especially important if you take diabetes medicines or blood thinners. ? Taking medicines such as aspirin and ibuprofen. These medicines can thin your blood. Do not take these medicines before your procedure if your doctor tells you not to.  Plan to have someone take you home from the hospital or clinic. What happens during the procedure?  You will lie on an exam table with your feet and legs supported as in a pelvic exam.  To lower your risk of infection: ? Your health care team will wash or sanitize their hands and put on germ-free (sterile) gloves. ? Your genital area will be washed with soap.  An IV tube will be inserted into one of your veins.  You will be given a medicine to help you relax (sedative).  A surgical instrument with a light and camera (resectoscope) will be inserted into your vagina and moved into your uterus. This allows your surgeon to see inside your uterus.  Endometrial tissue will be removed using one of the following methods: ? Radiofrequency. This method uses a radiofrequency-alternating electric current to remove the endometrium. ? Cryotherapy. This method uses extreme cold to freeze the endometrium. ? Heated-free liquid. This method uses a heated saltwater (saline) solution to remove the endometrium. ? Microwave. This method uses high-energy microwaves to heat up the endometrium and remove it. ? Thermal balloon. This method involves inserting a catheter with a balloon tip into the uterus. The balloon tip is   filled with heated fluid to remove the endometrium. The procedure may vary among health care providers and hospitals. What happens after the procedure?  Your blood pressure, heart rate, breathing rate, and blood oxygen level will be monitored until the medicines you were given have worn off.  As tissue healing occurs, you may  notice vaginal bleeding for 4-6 weeks after the procedure. You may also experience: ? Cramps. ? Thin, watery vaginal discharge that is light pink or brown in color. ? A need to urinate more frequently than usual. ? Nausea.  Do not drive for 24 hours if you were given a sedative.  Do not have sex or insert anything into your vagina until your health care provider approves. Summary  Endometrial ablation is done to treat the many causes of heavy menstrual bleeding.  The procedure may be done only after medications have been tried to control the bleeding.  Plan to have someone take you home from the hospital or clinic. This information is not intended to replace advice given to you by your health care provider. Make sure you discuss any questions you have with your health care provider. Document Released: 12/18/2003 Document Revised: 02/25/2016 Document Reviewed: 02/25/2016 Elsevier Interactive Patient Education  2017 Elsevier Inc.  

## 2017-04-27 NOTE — Progress Notes (Signed)
GYNECOLOGY OFFICE VISIT NOTE  History:  52 y.o. [id number] here today for management of AUB.  On Depo Provera for UAB, currently taking 150 mg q8 weeks. Has been 5 weeks since last dose. She feels she needs her next dose earlier; bleeding like a period currently.  Associated cramping.  Complicated PMH including stomach cancer and PE; estrogen therapy is contraindicated. She denies any abnormal vaginal discharge, pelvic pain or other concerns.   Past Medical History:  Diagnosis Date  . Costochondritis   . Esophageal reflux    On omeprazole  . Family history of cancer   . Headache(784.0) 2011   Migraines  . Heart murmur   . Hypertension    started around 2011  . Iron deficiency anemia due to chronic blood loss 05/25/2015  . 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 On antineoplastic chemotherapy    5-FU and leucovorin  . Ovarian cyst   . Pneumonia   . Pulmonary emboli ([place])   . Stomach cancer ([place]) dx'd 08/2013    Past Surgical History:  Procedure Laterality Date  . ESOPHAGOGASTRODUODENOSCOPY N/A 09/16/2013   Procedure: ESOPHAGOGASTRODUODENOSCOPY (EGD);  Surgeon: Wonda Horner, MD;  Location: [facility];  Service: Endoscopy;  Laterality: N/A;  . IR FLUORO GUIDE PORT INSERTION RIGHT  03/21/2017  . IR US GUIDE VASC ACCESS RIGHT  03/21/2017  . LAPAROSCOPY N/A 09/20/2013   Procedure: DIAGNOSTIC LAPAROSCOPY,DISTAL GASTRECTOMY AND FEEDING GASTROJEJUNOSTOMY;  Surgeon: [name], MD;  Location: [facility];  Service: General;  Laterality: N/A;  . OOPHORECTOMY     Around 1985 left ovary removal  . PORTACATH PLACEMENT Left 11/18/2013   Procedure: INSERTION PORT-A-CATH;  Surgeon: [name], MD;  Location: [facility];  Service: General;  Laterality: Left;  . TONSILLECTOMY     Around 1994  . TUBAL LIGATION      The following portions of the patient's history were reviewed and updated as appropriate: allergies, current medications, past family history, past medical history, past social history, past  surgical history and problem list.   Health Maintenance:  Normal pap and negative HRHPV on 02/11/15. Normal mammogram on 05/25/2016.  Review of Systems:  Pertinent items noted in HPI and remainder of comprehensive ROS otherwise negative.  Objective:  Physical Exam BP 128/88   Pulse 81   Wt 161 lb (73 kg)   BMI 31.44 kg/m  CONSTITUTIONAL: Well-developed, well-nourished female in no acute distress.  HENT:  Normocephalic, atraumatic. External right and left ear normal. Oropharynx is clear and moist EYES: Conjunctivae and EOM are normal. Pupils are equal, round, and reactive to light. No scleral icterus.  NECK: Normal range of motion, supple, no masses SKIN: Skin is warm and dry. No rash noted. Not diaphoretic. No erythema. No pallor. NEUROLOGIC: Alert and oriented to person, place, and time. Normal reflexes, muscle tone coordination. No cranial nerve deficit noted. PSYCHIATRIC: Normal mood and affect. Normal behavior. Normal judgment and thought content. CARDIOVASCULAR: Normal heart rate noted RESPIRATORY: Effort and breath sounds normal, no problems with respiration noted ABDOMEN: Soft, no distention noted.   PELVIC: Deferred MUSCULOSKELETAL: Normal range of motion. No edema noted.  Labs and Imaging No results found.  Assessment & Plan:  1. Abnormal uterine bleeding (AUB) Will get ultrasound for further evaluation, may need endometrial biopsy pending results (last scan in 02/2016 showed 1.4 mm stripe). Depo provera given today. Discussed possible endometrial ablation vs hysterectomy if bleeding is no longer controlled on Depo Provera.Concerned about possible masking of endometrial neoplasia with ablation, discussed this with  patient.  - US PELVIC COMPLETE WITH TRANSVAGINAL; Future - medroxyPROGESTERone (DEPO-PROVERA) injection 150 mg Routine preventative health maintenance measures emphasized. Please refer to After Visit Summary for other counseling recommendations.   Return in  about 4 weeks (around 05/25/2017) for Followup.   Total face-to-face time with patient: 15 minutes.  Over 50% of encounter was spent on counseling and coordination of care.   Verita Schneiders, MD, [place] for [employer] [facility] [employer], [place]

## 2017-04-27 NOTE — Progress Notes (Signed)
Pt stated still having bleeding but not heavy. Pt stated had the depo shot last month.

## 2017-04-29 ENCOUNTER — Ambulatory Visit (HOSPITAL_BASED_OUTPATIENT_CLINIC_OR_DEPARTMENT_OTHER)
Admission: RE | Admit: 2017-04-29 | Discharge: 2017-04-29 | Disposition: A | Discharge status: Home / Self Care | Payer: Medicare Other | Source: Ambulatory Visit | Attending: Hematology & Oncology | Admitting: Hematology & Oncology

## 2017-04-29 DIAGNOSIS — M5136 Other intervertebral disc degeneration, lumbar region: Secondary | ICD-10-CM | POA: Insufficient documentation

## 2017-04-29 DIAGNOSIS — M545 Low back pain: Secondary | ICD-10-CM | POA: Insufficient documentation

## 2017-04-29 DIAGNOSIS — G8929 Other chronic pain: Secondary | ICD-10-CM

## 2017-04-29 MED ORDER — GADOBENATE DIMEGLUMINE 529 MG/ML IV SOLN
15.0000 mL | Freq: Once | INTRAVENOUS | Status: AC | PRN
  Administered 2017-04-29: 12 mL via INTRAVENOUS

## 2017-05-01 ENCOUNTER — Telehealth: Payer: Self-pay | Admitting: *Deleted

## 2017-05-01 NOTE — Telephone Encounter (Addendum)
Patient is aware of results   ----- Message from Volanda Napoleon, MD sent at 04/30/2017  7:40 AM EDT ----- Call - MRI really looks ok!!  Some mild arthritis in the lower back.  No nerve issue.  pete

## 2017-05-02 ENCOUNTER — Other Ambulatory Visit: Payer: Self-pay | Admitting: Family

## 2017-05-02 ENCOUNTER — Telehealth: Payer: Self-pay

## 2017-05-02 NOTE — Telephone Encounter (Signed)
Received call from [facility] in imaging stating they had received a call from pt related to pain/swelling near IV site where contrast was given for MRI on Saturday.   Called pt to gain more information related to this problem. Per pt, Imaging was unable to use PAC for scan this week. She has a history of being a difficult peripheral stick, but consented for a peripheral vein to be used. States "they were able to find a vein" but reports now having a "knot" at the site of needle insertion that is "throbbing like a toothache." Denies redness or temperature change at the site. Denies diffuse edema to arm.   Advised pt to alternate cold and warm compresses as research indicates that cold compresses can relieve pain of IV contrast infiltration, while heat may improve absorption of fluid. Continue to monitor and contact office immediately if tissue becomes inflamed or worsens. Verbalizes understanding. dph

## 2017-05-03 ENCOUNTER — Ambulatory Visit (HOSPITAL_COMMUNITY)
Admission: RE | Admit: 2017-05-03 | Discharge: 2017-05-03 | Disposition: A | Discharge status: Home / Self Care | Payer: Medicare Other | Source: Ambulatory Visit | Attending: Obstetrics & Gynecology | Admitting: Obstetrics & Gynecology

## 2017-05-03 DIAGNOSIS — N939 Abnormal uterine and vaginal bleeding, unspecified: Secondary | ICD-10-CM

## 2017-05-03 DIAGNOSIS — D259 Leiomyoma of uterus, unspecified: Secondary | ICD-10-CM | POA: Insufficient documentation

## 2017-05-05 ENCOUNTER — Telehealth: Payer: Self-pay | Admitting: *Deleted

## 2017-05-05 NOTE — Telephone Encounter (Signed)
Received call from patient stating that her arm is still hurting from her from her MRI where she had an IV.  Patient called once this week with advice from the nurse. The site however is still no better. Spoke with Dr. Marin Olp who recommends patient go to the ED to have arm evaluated for a blood clot. Left message on personal voicemail.

## 2017-05-08 ENCOUNTER — Other Ambulatory Visit: Payer: Self-pay | Admitting: *Deleted

## 2017-05-08 DIAGNOSIS — C169 Malignant neoplasm of stomach, unspecified: Secondary | ICD-10-CM

## 2017-05-08 MED ORDER — HYDROCODONE-ACETAMINOPHEN 5-325 MG PO TABS: 1.0000 | ORAL_TABLET | Freq: Four times a day (QID) | ORAL | 0 refills | Status: DC | PRN

## 2017-05-11 ENCOUNTER — Other Ambulatory Visit: Payer: Medicare Other

## 2017-05-11 ENCOUNTER — Telehealth: Payer: Self-pay | Admitting: General Practice

## 2017-05-11 ENCOUNTER — Ambulatory Visit: Payer: Medicare Other | Admitting: Hematology & Oncology

## 2017-05-11 NOTE — Telephone Encounter (Signed)
-----   Message from Osborne Oman, MD sent at 05/04/2017 10:16 AM EDT ----- Multiple small fibroids seen on ultrasound. If bleeding still not controlled, will consider surgery (endometrial ablation vs hysterectomy).  Please call to inform patient of results and recommendations.

## 2017-05-11 NOTE — Telephone Encounter (Signed)
Called patient & informed her of results & recommendations. Patient states she will come in and talk to Dr Harolyn Rutherford about it then. Reminded patient of appt on 4/22. Patient verbalized understanding & had no questions

## 2017-05-25 ENCOUNTER — Inpatient Hospital Stay: Payer: Medicare Other | Attending: Hematology & Oncology

## 2017-05-25 DIAGNOSIS — Z85038 Personal history of other malignant neoplasm of large intestine: Secondary | ICD-10-CM | POA: Diagnosis present

## 2017-05-25 DIAGNOSIS — C169 Malignant neoplasm of stomach, unspecified: Secondary | ICD-10-CM

## 2017-05-25 MED ORDER — SODIUM CHLORIDE 0.9% FLUSH
10.0000 mL | INTRAVENOUS | Status: DC | PRN
  Administered 2017-05-25: 10 mL via INTRAVENOUS
  Filled 2017-05-25: qty 10

## 2017-05-25 MED ORDER — HEPARIN SOD (PORK) LOCK FLUSH 100 UNIT/ML IV SOLN
500.0000 [IU] | Freq: Once | INTRAVENOUS | Status: AC
  Administered 2017-05-25: 500 [IU] via INTRAVENOUS
  Filled 2017-05-25: qty 5

## 2017-06-06 ENCOUNTER — Ambulatory Visit: Payer: Medicare Other | Admitting: Family Medicine

## 2017-06-07 ENCOUNTER — Ambulatory Visit: Payer: Medicare Other | Admitting: Family Medicine

## 2017-06-08 ENCOUNTER — Other Ambulatory Visit: Payer: Self-pay | Admitting: *Deleted

## 2017-06-08 DIAGNOSIS — C169 Malignant neoplasm of stomach, unspecified: Secondary | ICD-10-CM

## 2017-06-08 MED ORDER — HYDROCODONE-ACETAMINOPHEN 5-325 MG PO TABS: 1.0000 | ORAL_TABLET | Freq: Four times a day (QID) | ORAL | 0 refills | Status: DC | PRN

## 2017-06-12 ENCOUNTER — Ambulatory Visit (INDEPENDENT_AMBULATORY_CARE_PROVIDER_SITE_OTHER): Payer: Medicare Other | Admitting: Obstetrics & Gynecology

## 2017-06-12 ENCOUNTER — Encounter: Payer: Self-pay | Admitting: Obstetrics & Gynecology

## 2017-06-12 VITALS — BP 113/76 | HR 71 | Ht 61.0 in | Wt 161.0 lb

## 2017-06-12 DIAGNOSIS — N939 Abnormal uterine and vaginal bleeding, unspecified: Secondary | ICD-10-CM

## 2017-06-12 NOTE — Patient Instructions (Signed)

## 2017-06-12 NOTE — Progress Notes (Signed)
GYNECOLOGY OFFICE VISIT NOTE  History:  52 y.o. [id number] here today for  Follow up of AUB management.  Has been managed with Depo Provera for years, gets this every 8 weeks.  Recently had some breakthrough bleeding and needed earlier dose of Depo Provera. ultrasound done, showed 2 mm EM and some small fibroids.  Today, she reports no bleeding but did report having period-like bleeding a month after the last dose. Next dose due on 06/22/2017.   She denies any abnormal vaginal discharge, bleeding, pelvic pain or other GYN concerns.   Past Medical History:  Diagnosis Date  . Costochondritis   . Esophageal reflux    On omeprazole  . Family history of cancer   . Headache(784.0) 2011   Migraines  . Heart murmur   . Hypertension    started around 2011  . Iron deficiency anemia due to chronic blood loss 05/25/2015  . 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 On antineoplastic chemotherapy    5-FU and leucovorin  . Ovarian cyst   . Pneumonia   . Pulmonary emboli ([place])   . Stomach cancer ([place]) dx'd 08/2013    Past Surgical History:  Procedure Laterality Date  . CESAREAN SECTION    . ESOPHAGOGASTRODUODENOSCOPY N/A 09/16/2013   Procedure: ESOPHAGOGASTRODUODENOSCOPY (EGD);  Surgeon: Wonda Horner, MD;  Location: [facility];  Service: Endoscopy;  Laterality: N/A;  . IR FLUORO GUIDE PORT INSERTION RIGHT  03/21/2017  . IR US GUIDE VASC ACCESS RIGHT  03/21/2017  . LAPAROSCOPY N/A 09/20/2013   Procedure: DIAGNOSTIC LAPAROSCOPY,DISTAL GASTRECTOMY AND FEEDING GASTROJEJUNOSTOMY;  Surgeon: [name], MD;  Location: [facility];  Service: General;  Laterality: N/A;  . OOPHORECTOMY     Around 1985 left ovary removal  . PORTACATH PLACEMENT Left 11/18/2013   Procedure: INSERTION PORT-A-CATH;  Surgeon: [name], MD;  Location: [facility];  Service: General;  Laterality: Left;  . TONSILLECTOMY     Around 1994  . TUBAL LIGATION     The following portions of the patient's history were reviewed and updated as  appropriate: allergies, current medications, past family history, past medical history, past social history, past surgical history and problem list.   Health Maintenance:  Normal pap and negative HRHPV on 02/11/15. Normal mammogram on 05/25/2016.  Review of Systems:  Pertinent items noted in HPI and remainder of comprehensive ROS otherwise negative.  Objective:  Physical Exam BP 113/76   Pulse 71   Ht 5\' 1"  (1.549 m)   Wt 161 lb (73 kg)   BMI 30.42 kg/m  CONSTITUTIONAL: Well-developed, well-nourished female in no acute distress.  SKIN: Skin is warm and dry. No rash noted. Not diaphoretic. No erythema. No pallor. NEUROLOGIC: Alert and oriented to person, place, and time. Normal reflexes, muscle tone coordination. No cranial nerve deficit noted. PSYCHIATRIC: Normal mood and affect. Normal behavior. Normal judgment and thought content. CARDIOVASCULAR: Normal heart rate noted RESPIRATORY: Effort and breath sounds normal, no problems with respiration noted ABDOMEN: Soft, no distention noted.   PELVIC: Deferred MUSCULOSKELETAL: Normal range of motion. No edema noted.  Labs and Imaging No results found.  Assessment & Plan:  1. Abnormal uterine bleeding (AUB) Will continue Depo Provera for now. Patient declines IUD, concerned about side effects.  Discussed surgical management options; patient is considering hysterectomy. Given PSH of cesarean section, radiation and ?bowel surgery; recommended LAVH. She will consider this option and let me know if she decides to proceed with surgery.  Routine preventative health maintenance measures emphasized. Please refer to  After Visit Summary for other counseling recommendations.   Return in about 10 days (around 06/22/2017) for Depo Provera injection.  Total face-to-face time with patient: 15 minutes.  Over 50% of encounter was spent on counseling and coordination of care.   Verita Schneiders, MD, [place] for [employer] [facility], [place]

## 2017-06-22 ENCOUNTER — Ambulatory Visit (INDEPENDENT_AMBULATORY_CARE_PROVIDER_SITE_OTHER): Payer: Medicare Other

## 2017-06-22 VITALS — Wt 160.6 lb

## 2017-06-22 DIAGNOSIS — Z3042 Encounter for surveillance of injectable contraceptive: Secondary | ICD-10-CM

## 2017-06-22 MED ORDER — MEDROXYPROGESTERONE ACETATE 150 MG/ML IM SUSP
150.0000 mg | Freq: Once | INTRAMUSCULAR | Status: AC
  Administered 2017-06-22: 150 mg via INTRAMUSCULAR

## 2017-06-22 NOTE — Progress Notes (Signed)
Chart reviewed for nurse visit. Agree with plan of care.   Jorje Guild, NP 06/22/2017 2:09 PM

## 2017-06-22 NOTE — Progress Notes (Signed)
Pt came for Depo injection in left arm.Pt accepted well.

## 2017-06-27 NOTE — Progress Notes (Signed)
HPI: FU chest pain. Patient seen by [name] 6/18 with chest pain and palpitations. Patient had a nuclear study in [facility] October 2013. Her ejection fraction was 74% and no ischemia. Monitor June 2018 showed sinus to sinus tachycardia with rare PVC and brief run of SVT. Echocardiogram June 2018 showed normal LV function and mild mitral regurgitation. Since last seen,  patient denies dyspnea.  She continues to have occasional palpitations unchanged compared to last visit.  She has some dizziness with this but no syncope.  She denies exertional chest pain.  Current Outpatient Medications  Medication Sig Dispense Refill  . carvedilol (COREG) 6.25 MG tablet Take 6.25 mg by mouth 2 (two) times daily with a meal.    . HYDROcodone-acetaminophen (NORCO/VICODIN) 5-325 MG tablet Take 1 tablet by mouth every 6 (six) hours as needed for moderate pain. 60 tablet 0  . lidocaine-prilocaine (EMLA) cream Apply 1 application topically as needed. 30 g 6  . Multiple Vitamins-Minerals (MULTIVITAMIN & MINERAL PO) Take 1 tablet by mouth daily.    Marland Kitchen omeprazole (PRILOSEC) 20 MG capsule Take 20 mg by mouth daily.  5  . ondansetron (ZOFRAN) 4 MG tablet Take 2 tablets (8 mg total) by mouth 2 (two) times daily. 60 tablet 3  . polyethylene glycol powder (GLYCOLAX/MIRALAX) powder   1  . promethazine (PHENERGAN) 12.5 MG tablet Take 12.5 mg by mouth every 6 (six) hours as needed for nausea or vomiting.     Current Facility-Administered Medications  Medication Dose Route Frequency Provider Last Rate Last Dose  . medroxyPROGESTERone (DEPO-PROVERA) injection 150 mg  150 mg Intramuscular Q8 Weeks Anyanwu, Ugonna A, MD   150 mg at 03/29/17 1500     Past Medical History:  Diagnosis Date  . Costochondritis   . Esophageal reflux    On omeprazole  . Family history of cancer   . Headache(784.0) 2011   Migraines  . Heart murmur   . Hypertension    started around 2011  . Iron deficiency anemia due to chronic blood  loss 05/25/2015  . 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 On antineoplastic chemotherapy    5-FU and leucovorin  . Ovarian cyst   . Pneumonia   . Pulmonary emboli ([place])   . Stomach cancer ([place]) dx'd 08/2013    Past Surgical History:  Procedure Laterality Date  . CESAREAN SECTION    . ESOPHAGOGASTRODUODENOSCOPY N/A 09/16/2013   Procedure: ESOPHAGOGASTRODUODENOSCOPY (EGD);  Surgeon: Wonda Horner, MD;  Location: [facility];  Service: Endoscopy;  Laterality: N/A;  . IR FLUORO GUIDE PORT INSERTION RIGHT  03/21/2017  . IR US GUIDE VASC ACCESS RIGHT  03/21/2017  . LAPAROSCOPY N/A 09/20/2013   Procedure: DIAGNOSTIC LAPAROSCOPY,DISTAL GASTRECTOMY AND FEEDING GASTROJEJUNOSTOMY;  Surgeon: [name], MD;  Location: [facility];  Service: General;  Laterality: N/A;  . OOPHORECTOMY     Around 1985 left ovary removal  . PORTACATH PLACEMENT Left 11/18/2013   Procedure: INSERTION PORT-A-CATH;  Surgeon: [name], MD;  Location: [facility];  Service: General;  Laterality: Left;  . TONSILLECTOMY     Around 1994  . TUBAL LIGATION      Social History   Socioeconomic History  . Marital status: Married    Spouse name: Not on file  . Number of children: 2  . Years of education: Not on file  . Highest education level: Not on file  Occupational History    Employer: [employer]  Social Needs  . Emergency planning/management officer  strain: Not on file  . Food insecurity:    Worry: Not on file    Inability: Not on file  . Transportation needs:    Medical: Not on file    Non-medical: Not on file  Tobacco Use  . Smoking status: Former Smoker    Packs/day: 1.00    Years: 16.00    Pack years: 16.00    Last attempt to quit: 02/21/1998    Years since quitting: 19.3  . Smokeless tobacco: Never Used  . Tobacco comment: 0.5-1.0 ppd for 16 yrs  Substance and Sexual Activity  . Alcohol use: No    Alcohol/week: 0.0 oz    Comment: wine at dinner previously  . Drug use: No  . Sexual activity: Not Currently    Birth  control/protection: Surgical  Lifestyle  . Physical activity:    Days per week: Not on file    Minutes per session: Not on file  . Stress: Not on file  Relationships  . Social connections:    Talks on phone: Not on file    Gets together: Not on file    Attends religious service: Not on file    Active member of club or organization: Not on file    Attends meetings of clubs or organizations: Not on file    Relationship status: Not on file  . Intimate partner violence:    Fear of current or ex partner: Not on file    Emotionally abused: Not on file    Physically abused: Not on file    Forced sexual activity: Not on file  Other Topics Concern  . Not on file  Social History Narrative  . Not on file    Family History  Problem Relation Age of Onset  . Hypertension Mother   . Diabetes Mellitus II Mother   . Other Mother        hx of hysterectomy for unspecified reason  . Hypertension Sister   . Hypertension Brother   . Heart failure Maternal Grandmother   . Heart Problems Maternal Grandmother   . Hypertension Maternal Grandfather   . Diabetes Maternal Grandfather   . Colon cancer Paternal Uncle        dx. late 50s-early 60s  . Gastric cancer Cousin 37  . Colon cancer Cousin        dx. early-late 68s; (father also had colon cancer)  . Colon cancer Paternal Uncle        dx. late 50s-early 60s  . Lung cancer Paternal Uncle        dx. 45s; smoker  . Colon polyps Paternal Uncle        unknown number  . Heart attack Maternal Aunt   . Heart Problems Paternal Aunt   . Heart attack Paternal Grandfather   . Colon cancer Paternal Grandfather        dx. 50s-60s  . Colon polyps Brother        1 maternal half-brother had approx 3 polyps  . Breast cancer Cousin 41  . Breast cancer Cousin        dx. early 20s or earlier (father had colon cancer)  . Lung cancer Other        PGF's brothers (x4); dx. later in life; lim info    ROS: no fevers or chills, productive cough, hemoptysis,  dysphasia, odynophagia, melena, hematochezia, dysuria, hematuria, rash, seizure activity, orthopnea, PND, pedal edema, claudication. Remaining systems are negative.  Physical Exam: Well-developed well-nourished in no acute distress.  Skin is warm and dry.  HEENT is normal.  Neck is supple.  Chest is clear to auscultation with normal expansion.  Cardiovascular exam is regular rate and rhythm.  Abdominal exam nontender or distended. No masses palpated. Extremities show no edema. neuro grossly intact  ECG-sinus rhythm at a rate of 74.  Left ventricular hypertrophy.  Personally reviewed  A/P  1 atypical chest pain-patient has no exertional symptoms.  No plans for further ischemia evaluation at this point.  2 palpitations-symptoms are reasonably well controlled.  Continue beta-blockade.  3 hypertension-blood pressure is controlled.  Continue present medications.  4 mitral regurgitation-mild on most recent echocardiogram in June 2018.  5 history of pulmonary embolus-Xarelto now discontinued by hematology oncology.  Further management per Dr. Marin Olp.  Kirk Ruths, MD

## 2017-06-28 ENCOUNTER — Ambulatory Visit (INDEPENDENT_AMBULATORY_CARE_PROVIDER_SITE_OTHER): Payer: Medicare Other | Admitting: Cardiology

## 2017-06-28 ENCOUNTER — Encounter: Payer: Self-pay | Admitting: Cardiology

## 2017-06-28 VITALS — BP 99/66 | HR 79 | Ht 60.0 in | Wt 161.1 lb

## 2017-06-28 DIAGNOSIS — I10 Essential (primary) hypertension: Secondary | ICD-10-CM | POA: Diagnosis not present

## 2017-06-28 DIAGNOSIS — R0789 Other chest pain: Secondary | ICD-10-CM | POA: Diagnosis not present

## 2017-06-28 DIAGNOSIS — R002 Palpitations: Secondary | ICD-10-CM

## 2017-06-28 NOTE — Patient Instructions (Signed)
Your physician wants you to follow-up in: ONE YEAR WITH DR CRENSHAW You will receive a reminder letter in the mail two months in advance. If you don't receive a letter, please call our office to schedule the follow-up appointment.   If you need a refill on your cardiac medications before your next appointment, please call your pharmacy.  

## 2017-07-10 ENCOUNTER — Other Ambulatory Visit: Payer: Self-pay | Admitting: *Deleted

## 2017-07-10 DIAGNOSIS — C169 Malignant neoplasm of stomach, unspecified: Secondary | ICD-10-CM

## 2017-07-11 ENCOUNTER — Inpatient Hospital Stay: Payer: Medicare Other

## 2017-07-11 ENCOUNTER — Encounter: Payer: Self-pay | Admitting: Family

## 2017-07-11 ENCOUNTER — Inpatient Hospital Stay: Payer: Medicare Other | Attending: Hematology & Oncology | Admitting: Family

## 2017-07-11 ENCOUNTER — Other Ambulatory Visit: Payer: Self-pay

## 2017-07-11 VITALS — BP 99/67 | HR 75 | Temp 98.1°F | Resp 18 | Wt 162.0 lb

## 2017-07-11 DIAGNOSIS — D509 Iron deficiency anemia, unspecified: Secondary | ICD-10-CM | POA: Diagnosis not present

## 2017-07-11 DIAGNOSIS — K219 Gastro-esophageal reflux disease without esophagitis: Secondary | ICD-10-CM

## 2017-07-11 DIAGNOSIS — Z452 Encounter for adjustment and management of vascular access device: Secondary | ICD-10-CM | POA: Diagnosis present

## 2017-07-11 DIAGNOSIS — C169 Malignant neoplasm of stomach, unspecified: Secondary | ICD-10-CM

## 2017-07-11 DIAGNOSIS — Z85038 Personal history of other malignant neoplasm of large intestine: Secondary | ICD-10-CM | POA: Insufficient documentation

## 2017-07-11 DIAGNOSIS — N951 Menopausal and female climacteric states: Secondary | ICD-10-CM | POA: Diagnosis not present

## 2017-07-11 DIAGNOSIS — D500 Iron deficiency anemia secondary to blood loss (chronic): Secondary | ICD-10-CM

## 2017-07-11 DIAGNOSIS — Z95828 Presence of other vascular implants and grafts: Secondary | ICD-10-CM

## 2017-07-11 LAB — CBC WITH DIFFERENTIAL (CANCER CENTER ONLY)
BASOS ABS: 0 10*3/uL (ref 0.0–0.1)
BASOS PCT: 0 %
EOS ABS: 0.1 10*3/uL (ref 0.0–0.5)
EOS PCT: 2 %
HCT: 36.5 % (ref 34.8–46.6)
Hemoglobin: 11.6 g/dL (ref 11.6–15.9)
Lymphocytes Relative: 28 %
Lymphs Abs: 1.5 10*3/uL (ref 0.9–3.3)
MCH: 29 pg (ref 26.0–34.0)
MCHC: 31.8 g/dL — ABNORMAL LOW (ref 32.0–36.0)
MCV: 91.3 fL (ref 81.0–101.0)
MONO ABS: 0.5 10*3/uL (ref 0.1–0.9)
Monocytes Relative: 9 %
Neutro Abs: 3.3 10*3/uL (ref 1.5–6.5)
Neutrophils Relative %: 61 %
PLATELETS: 196 10*3/uL (ref 145–400)
RBC: 4 MIL/uL (ref 3.70–5.32)
RDW: 14.1 % (ref 11.1–15.7)
WBC: 5.5 10*3/uL (ref 3.9–10.0)

## 2017-07-11 LAB — CMP (CANCER CENTER ONLY)
ALBUMIN: 3.5 g/dL (ref 3.5–5.0)
ALT: 26 U/L (ref 10–47)
ANION GAP: 7 (ref 5–15)
AST: 25 U/L (ref 11–38)
Alkaline Phosphatase: 64 U/L (ref 26–84)
BILIRUBIN TOTAL: 0.5 mg/dL (ref 0.2–1.6)
BUN: 6 mg/dL — ABNORMAL LOW (ref 7–22)
CALCIUM: 9.4 mg/dL (ref 8.0–10.3)
CO2: 29 mmol/L (ref 18–33)
CREATININE: 1 mg/dL (ref 0.60–1.20)
Chloride: 104 mmol/L (ref 98–108)
Glucose, Bld: 115 mg/dL (ref 73–118)
Potassium: 4.7 mmol/L (ref 3.3–4.7)
SODIUM: 140 mmol/L (ref 128–145)
TOTAL PROTEIN: 7.1 g/dL (ref 6.4–8.1)

## 2017-07-11 MED ORDER — ALTEPLASE 2 MG IJ SOLR
INTRAMUSCULAR | Status: AC
  Filled 2017-07-11: qty 2

## 2017-07-11 MED ORDER — HEPARIN SOD (PORK) LOCK FLUSH 100 UNIT/ML IV SOLN
500.0000 [IU] | Freq: Once | INTRAVENOUS | Status: AC
  Administered 2017-07-11: 500 [IU] via INTRAVENOUS
  Filled 2017-07-11: qty 5

## 2017-07-11 MED ORDER — ALTEPLASE 2 MG IJ SOLR
2.0000 mg | Freq: Once | INTRAMUSCULAR | Status: AC | PRN
  Administered 2017-07-11: 2 mg
  Filled 2017-07-11: qty 2

## 2017-07-11 MED ORDER — HYDROCODONE-ACETAMINOPHEN 5-325 MG PO TABS: 1.0000 | ORAL_TABLET | Freq: Four times a day (QID) | ORAL | 0 refills | Status: DC | PRN

## 2017-07-11 MED ORDER — SODIUM CHLORIDE 0.9% FLUSH
10.0000 mL | INTRAVENOUS | Status: DC | PRN
  Administered 2017-07-11: 10 mL via INTRAVENOUS
  Filled 2017-07-11: qty 10

## 2017-07-11 MED ORDER — STERILE WATER FOR INJECTION IJ SOLN
INTRAMUSCULAR | Status: AC
  Filled 2017-07-11: qty 10

## 2017-07-11 NOTE — Progress Notes (Signed)
Hematology and Oncology Follow Up Visit  Paula [name] [id number] [birth year] 52 y.o. 07/11/2017   Principle Diagnosis:  Stage IIA (T1a, N2, M0) gastric adenocarcinoma diagnosed in July of 2015 Intermittent iron deficiency anemia                          Prior Therapy:  Status post diagnostic laparoscopy, distal gastrectomy with billroth II anastamosis and feeding gastrojejunostomy tube under the care of Dr. Barry Dienes on 09/20/2013 Adjuvant systemic chemotherapy with 5-FU 600 mg/M2 with leucovorin on days 1, 8, 15, 22, 29 and 36 every 8 weeks Adjuvant concurrent chemoradiation with Xeloda 650 mg/M2 by mouth twice a day for 5 days every week with radiation. The patient completed the course of radiotherapy but she declined to take Xeloda during her radiation  Current Therapy:   IV iron as indicated    Interim History:  [name] is here today for follow-up. She is symptomatic with fatigue, palpitations and dizziness.  No fever, chills, cough, rash, SOB, chest pain, abdominal pain or changes in bowel or bladder habits.  Her endoscopy in March showed chronic superficial gastritis and mild reactive atypia. No dysplasia or malignancy identified. She is on phenergan , prilosec and pepcid.  No episodes of bleeding, no bruising or petechiae.  She does have hot flashes mostly at night that she states are tolerable for now.  She had nausea yesterday and did vomit. She has issues with GERD and has a hiatal hernia. She takes phenergan as needed. She is staying well hydrated and her weight is stable.  No lymphadneopathy noted on exam.  No swelling, tenderness, numbness or tingling in her extremities.   ECOG Performance Status: 1 - Symptomatic but completely ambulatory  Medications:  Allergies as of 07/11/2017      Reactions   Tape Itching      Medication List        Accurate as of 07/11/17 12:56 PM. Always use your most recent med list.          carvedilol 6.25 MG  tablet Commonly known as:  COREG Take 6.25 mg by mouth 2 (two) times daily with a meal.   HYDROcodone-acetaminophen 5-325 MG tablet Commonly known as:  NORCO/VICODIN Take 1 tablet by mouth every 6 (six) hours as needed for moderate pain.   lidocaine-prilocaine cream Commonly known as:  EMLA Apply 1 application topically as needed.   MULTIVITAMIN & MINERAL PO Take 1 tablet by mouth daily.   omeprazole 20 MG capsule Commonly known as:  PRILOSEC Take 20 mg by mouth daily.   ondansetron 4 MG tablet Commonly known as:  ZOFRAN Take 2 tablets (8 mg total) by mouth 2 (two) times daily.   polyethylene glycol powder powder Commonly known as:  GLYCOLAX/MIRALAX   promethazine 12.5 MG tablet Commonly known as:  PHENERGAN Take 12.5 mg by mouth every 6 (six) hours as needed for nausea or vomiting.       Allergies:  Allergies  Allergen Reactions  . Tape Itching    Past Medical History, Surgical history, Social history, and Family History were reviewed and updated.  Review of Systems: All other 10 point review of systems is negative.   Physical Exam:  weight is 162 lb (73.5 kg). Her oral temperature is 98.1 F (36.7 C). Her blood pressure is 99/67 and her pulse is 75. Her respiration is 18 and oxygen saturation is 100%.   Wt Readings from Last 3 Encounters:  07/11/17 162  lb (73.5 kg)  06/28/17 161 lb 1.3 oz (73.1 kg)  06/22/17 160 lb 9.6 oz (72.8 kg)    Ocular: Sclerae unicteric, pupils equal, round and reactive to light Ear-nose-throat: Oropharynx clear, dentition fair Lymphatic: No cervical, supraclavicular or axillary adenopathy Lungs no rales or rhonchi, good excursion bilaterally Heart regular rate and rhythm, no murmur appreciated Abd soft, nontender, positive bowel sounds, no liver or spleen tip palpated on exam, no fluid wave  MSK no focal spinal tenderness, no joint edema Neuro: non-focal, well-oriented, appropriate affect Breasts: Deferred   Lab Results   Component Value Date   WBC 5.8 04/13/2017   HGB 12.0 04/13/2017   HCT 37.3 04/13/2017   MCV 90.3 04/13/2017   PLT 270 04/13/2017   Lab Results  Component Value Date   FERRITIN 93 04/13/2017   IRON 87 04/13/2017   TIBC 238 04/13/2017   UIBC 150 04/13/2017   IRONPCTSAT 37 04/13/2017   Lab Results  Component Value Date   RBC 4.13 04/13/2017   No results found for: Nils Pyle [facility] Lab Results  Component Value Date   IGGSERUM 1,273 05/21/2015   IGMSERUM 40 05/21/2015   No results found for: Odetta Pink, SPEI   Chemistry      Component Value Date/Time   NA 141 04/13/2017 1158   NA 140 01/26/2017 1002   NA 140 09/24/2015 0953   K 3.6 04/13/2017 1158   K 3.5 01/26/2017 1002   K 4.0 09/24/2015 0953   CL 110 (H) 04/13/2017 1158   CL 106 01/26/2017 1002   CO2 26 04/13/2017 1158   CO2 26 01/26/2017 1002   CO2 23 09/24/2015 0953   BUN 10 04/13/2017 1158   BUN 6 (L) 01/26/2017 1002   BUN 8.6 09/24/2015 0953   CREATININE 1.10 04/13/2017 1158   CREATININE 1.1 01/26/2017 1002   CREATININE 1.0 09/24/2015 0953      Component Value Date/Time   CALCIUM 9.1 04/13/2017 1158   CALCIUM 8.9 01/26/2017 1002   CALCIUM 9.6 09/24/2015 0953   ALKPHOS 70 04/13/2017 1158   ALKPHOS 68 01/26/2017 1002   ALKPHOS 99 09/24/2015 0953   AST 18 04/13/2017 1158   AST 17 09/24/2015 0953   ALT 14 04/13/2017 1158   ALT 17 01/26/2017 1002   ALT 11 09/24/2015 0953   BILITOT 0.7 04/13/2017 1158   BILITOT 0.83 09/24/2015 0953      Impression and Plan: [name] is a very pleasant 52 yo African American female with history of stage II gastric cancer and partial gastrectomy followed by adjuvant chemo and radiation. She is doing well and so far there has been no evidence of recurrence.  She also has iron deficiency anemia and is symptomatic with fatigue, dizziness and palpitations.  We will see what her iron studies  show and bring her back in next week for infusion if needed.  We will plan to see her back in another 3 months for follow-up with port flush every 6 weeks.  She will contact our office with any questions or concerns. We can certainly see her sooner if need be.   Laverna Peace, NP 5/21/201912:56 PM

## 2017-07-11 NOTE — Progress Notes (Signed)
Port was accessed with no blood return. Cathflo placed in port at 1235. Unsuccessfully tried to bet blood at 1305, 1335, 1405 and 1420 when patient asked to hava her blood drawn peripherally. Cathflo was reovered from port and port was flushed.

## 2017-07-12 ENCOUNTER — Telehealth: Payer: Self-pay | Admitting: *Deleted

## 2017-07-12 LAB — FERRITIN: FERRITIN: 81 ng/mL (ref 9–269)

## 2017-07-12 LAB — IRON AND TIBC
IRON: 76 ug/dL (ref 41–142)
SATURATION RATIOS: 30 % (ref 21–57)
TIBC: 255 ug/dL (ref 236–444)
UIBC: 179 ug/dL

## 2017-07-12 NOTE — Telephone Encounter (Signed)
Patient wants her iron level results. Reviewed with Laverna Peace NP. Returned patient call and left results on personal voice mail.

## 2017-07-13 ENCOUNTER — Ambulatory Visit: Payer: Medicare Other

## 2017-08-03 ENCOUNTER — Encounter: Payer: Self-pay | Admitting: Radiation Oncology

## 2017-08-03 NOTE — Progress Notes (Signed)
[name] presents for follow up of radiation completed 06/17/14 to her gastric region, post op. She is feeling well today, and denies pain. She does report chronic pain to her back and abdomen due to scar tissue. She is taking hydrocodone daily for this pain with relief. She is eating well. She becomes full fast and will vomit about once every 2 weeks. She will have stomach cramps at times. She saw Laverna Peace NP on 07/11/17 and will see Dr. Marin Olp again on 10/03/17  BP 110/78   Pulse 73   Temp 98.2 F (36.8 C)   Resp 18   Wt 161 lb 6.4 oz (73.2 kg)   SpO2 100%   BMI 31.52 kg/m    Wt Readings from Last 3 Encounters:  08/16/17 161 lb 6.4 oz (73.2 kg)  07/11/17 162 lb (73.5 kg)  06/28/17 161 lb 1.3 oz (73.1 kg)

## 2017-08-09 ENCOUNTER — Telehealth: Payer: Self-pay | Admitting: Hematology & Oncology

## 2017-08-09 NOTE — Telephone Encounter (Signed)
Faxed medical records to: [facility] CASE: [id number] F: [phone]          COPY SCANNED

## 2017-08-10 ENCOUNTER — Other Ambulatory Visit: Payer: Self-pay | Admitting: *Deleted

## 2017-08-10 DIAGNOSIS — C169 Malignant neoplasm of stomach, unspecified: Secondary | ICD-10-CM

## 2017-08-10 MED ORDER — HYDROCODONE-ACETAMINOPHEN 5-325 MG PO TABS: 1.0000 | ORAL_TABLET | Freq: Four times a day (QID) | ORAL | 0 refills | Status: DC | PRN

## 2017-08-15 ENCOUNTER — Ambulatory Visit (INDEPENDENT_AMBULATORY_CARE_PROVIDER_SITE_OTHER): Payer: Medicare Other | Admitting: *Deleted

## 2017-08-15 VITALS — BP 111/64 | HR 72

## 2017-08-15 DIAGNOSIS — N924 Excessive bleeding in the premenopausal period: Secondary | ICD-10-CM | POA: Diagnosis not present

## 2017-08-15 DIAGNOSIS — N939 Abnormal uterine and vaginal bleeding, unspecified: Secondary | ICD-10-CM

## 2017-08-16 ENCOUNTER — Encounter: Payer: Self-pay | Admitting: Radiation Oncology

## 2017-08-16 ENCOUNTER — Other Ambulatory Visit: Payer: Self-pay

## 2017-08-16 ENCOUNTER — Ambulatory Visit
Admission: RE | Admit: 2017-08-16 | Discharge: 2017-08-16 | Disposition: A | Discharge status: Home / Self Care | Payer: Medicare Other | Source: Ambulatory Visit | Attending: Radiation Oncology | Admitting: Radiation Oncology

## 2017-08-16 VITALS — BP 110/78 | HR 73 | Temp 98.2°F | Resp 18 | Wt 161.4 lb

## 2017-08-16 DIAGNOSIS — Z08 Encounter for follow-up examination after completed treatment for malignant neoplasm: Secondary | ICD-10-CM | POA: Diagnosis present

## 2017-08-16 DIAGNOSIS — Z79899 Other long term (current) drug therapy: Secondary | ICD-10-CM | POA: Insufficient documentation

## 2017-08-16 DIAGNOSIS — G8929 Other chronic pain: Secondary | ICD-10-CM | POA: Insufficient documentation

## 2017-08-16 DIAGNOSIS — Z923 Personal history of irradiation: Secondary | ICD-10-CM | POA: Insufficient documentation

## 2017-08-16 DIAGNOSIS — C163 Malignant neoplasm of pyloric antrum: Secondary | ICD-10-CM

## 2017-08-16 DIAGNOSIS — Z888 Allergy status to other drugs, medicaments and biological substances status: Secondary | ICD-10-CM | POA: Diagnosis not present

## 2017-08-16 HISTORY — DX: Personal history of irradiation: Z92.3

## 2017-08-16 NOTE — Progress Notes (Signed)
Patient received  Depo Provera injection for AUB control as ordered. I have reviewed the chart and agree with nursing staff's documentation of this patient's encounter.  Verita Schneiders, MD 08/16/2017 8:58 AM

## 2017-08-16 NOTE — Progress Notes (Signed)
Radiation Oncology         [phone]) [phone] ________________________________  Name: Paula [name] MRN: [id number]  Date: 08/16/2017  DOB: [birth year]  Follow-Up Visit Note  Outpatient  CC: [facility]  Curt Bears, MD  Diagnosis and Prior Radiotherapy:     ICD-10-CM   1. Cancer of antrum of stomach (HCC) C16.3   Stage IIA pT1a, pN2, pMX poorly differentiated adenocarcinoma with signet rings, diffuse type, of gastric antrum and body    05/12/14 - 06/17/14 : Post-op gastric region / nodes / 45 Gy in 25 fractions  Narrative: [name] presents for follow up of radiation completed 06/17/14 to her Post-op gastric region/ nodes.  She reports placement of new port for blood draws after vein collapsed from chemo.  She endorses Depo causing weight gain. She also reports chronic back pain.  Interim imaging:  CT abdomen w contrast and CT chest w contrast on 12/14/16 showed distal gastrectomy and gastrojejunostomy without evidence of recurrent or metastatic disease. MRI L-spine was negative for metastasis on 04/29/17. US pelvic showed uterine fibroids but no acute findings otherwise.   She plans on hysterectomy soon for vaginal bleeding.  I personally reviewed the patient's imaging.  She last saw Laverna Peace, NP in [place] on 07/11/17 and will follow up with them again on 10/03/17.   ALLERGIES:  is allergic to tape.  Meds: Current Outpatient Medications  Medication Sig Dispense Refill  . carvedilol (COREG) 6.25 MG tablet Take 6.25 mg by mouth 2 (two) times daily with a meal.    . HYDROcodone-acetaminophen (NORCO/VICODIN) 5-325 MG tablet Take 1 tablet by mouth every 6 (six) hours as needed for moderate pain. 60 tablet 0  . lidocaine-prilocaine (EMLA) cream Apply 1 application topically as needed. 30 g 6  . Multiple Vitamins-Minerals (MULTIVITAMIN & MINERAL PO) Take 1 tablet by mouth daily.    Marland Kitchen omeprazole (PRILOSEC) 20 MG capsule Take 20 mg by  mouth daily.  5  . ondansetron (ZOFRAN) 4 MG tablet Take 2 tablets (8 mg total) by mouth 2 (two) times daily. 60 tablet 3  . polyethylene glycol powder (GLYCOLAX/MIRALAX) powder   1  . promethazine (PHENERGAN) 12.5 MG tablet Take 12.5 mg by mouth every 6 (six) hours as needed for nausea or vomiting.     Current Facility-Administered Medications  Medication Dose Route Frequency Provider Last Rate Last Dose  . medroxyPROGESTERone (DEPO-PROVERA) injection 150 mg  150 mg Intramuscular Q8 Weeks Anyanwu, Ugonna A, MD   150 mg at 08/15/17 1448   REVIEW OF SYSTEMS:as above  Physical Findings: The patient is in no acute distress. Patient is alert and oriented.  weight is 161 lb 6.4 oz (73.2 kg). Her temperature is 98.2 F (36.8 C). Her blood pressure is 110/78 and her pulse is 73. Her respiration is 18 and oxygen saturation is 100%.   General: Alert and oriented, in no acute distress. Neck: Neck is supple, no palpable cervical or supraclavicular lymphadenopathy. Heart: Regular in rate and rhythm. No obvious murmurs. Chest: Clear to auscultation bilaterally. Abdomen: Mild tenderness to palpation in RUQ. No rigidity or guarding. Extremities: No edema. Lymphatics: see Neck Exam Skin: No concerning lesions. Neurologic: No obvious focalities. Speech is fluent. Coordination is intact. Psychiatric: Judgment and insight are intact. Affect is appropriate.  Lab Findings: Lab Results  Component Value Date   WBC 5.5 07/11/2017   HGB 11.6 07/11/2017   HCT 36.5 07/11/2017   MCV 91.3 07/11/2017   PLT 196 07/11/2017  Radiographic Findings: As above; I reviewed her scans  Impression/Plan: NED on clinical exam and on recent imaging.  The patient is scheduled to meet with Dr. Marin Olp on 10/03/17.  Follow up with me in 1 year.  I spent 15 minutes face to face with the patient and more than 50% of that time was spent in counseling and/or coordination of  care. _____________________________________   Eppie Gibson, MD  This document serves as a record of services personally performed by Eppie Gibson, MD. It was created on her behalf by Linward Natal, a trained medical scribe. The creation of this record is based on the scribe's personal observations and the provider's statements to them. This document has been checked and approved by the attending provider.

## 2017-08-17 ENCOUNTER — Ambulatory Visit: Payer: Medicare Other

## 2017-08-17 ENCOUNTER — Encounter (HOSPITAL_COMMUNITY): Payer: Self-pay

## 2017-08-22 ENCOUNTER — Other Ambulatory Visit: Payer: Self-pay

## 2017-08-22 ENCOUNTER — Other Ambulatory Visit: Payer: Self-pay | Admitting: Family

## 2017-08-22 ENCOUNTER — Inpatient Hospital Stay: Payer: Medicare Other | Attending: Hematology & Oncology

## 2017-08-22 VITALS — BP 122/74 | HR 69 | Temp 98.3°F | Resp 18

## 2017-08-22 DIAGNOSIS — Z95828 Presence of other vascular implants and grafts: Secondary | ICD-10-CM

## 2017-08-22 DIAGNOSIS — Z85038 Personal history of other malignant neoplasm of large intestine: Secondary | ICD-10-CM | POA: Diagnosis present

## 2017-08-22 DIAGNOSIS — Z452 Encounter for adjustment and management of vascular access device: Secondary | ICD-10-CM | POA: Insufficient documentation

## 2017-08-22 MED ORDER — SODIUM CHLORIDE 0.9% FLUSH
10.0000 mL | INTRAVENOUS | Status: DC | PRN
  Administered 2017-08-22 (×2): 10 mL via INTRAVENOUS
  Filled 2017-08-22: qty 10

## 2017-08-22 MED ORDER — STERILE WATER FOR INJECTION IJ SOLN
INTRAMUSCULAR | Status: AC
  Filled 2017-08-22: qty 10

## 2017-08-22 MED ORDER — ALTEPLASE 2 MG IJ SOLR
2.0000 mg | Freq: Once | INTRAMUSCULAR | Status: AC | PRN
  Administered 2017-08-22: 2 mg
  Filled 2017-08-22: qty 2

## 2017-08-22 MED ORDER — HEPARIN SOD (PORK) LOCK FLUSH 100 UNIT/ML IV SOLN
500.0000 [IU] | Freq: Once | INTRAVENOUS | Status: AC
  Administered 2017-08-22: 500 [IU] via INTRAVENOUS
  Filled 2017-08-22: qty 5

## 2017-08-22 MED ORDER — ALTEPLASE 2 MG IJ SOLR
INTRAMUSCULAR | Status: AC
  Filled 2017-08-22: qty 2

## 2017-08-22 NOTE — Progress Notes (Signed)
Port was access with no blood return. Cathflo was placed into port at 1257. Attempts were made to draw blood at 1330, 1400, 1430 and 1500 with no success.  Cathflo was recovered and port was flushed. Being the 2nd occurrence patient was reffered the investigational radiology for a dye test.

## 2017-08-22 NOTE — Patient Instructions (Signed)

## 2017-08-23 ENCOUNTER — Encounter (HOSPITAL_COMMUNITY): Payer: Self-pay

## 2017-08-28 ENCOUNTER — Ambulatory Visit: Payer: Medicare Other | Admitting: Obstetrics & Gynecology

## 2017-08-30 ENCOUNTER — Ambulatory Visit (HOSPITAL_COMMUNITY)
Admission: RE | Admit: 2017-08-30 | Discharge: 2017-08-30 | Disposition: A | Discharge status: Home / Self Care | Payer: Medicare Other | Source: Ambulatory Visit | Attending: Family | Admitting: Family

## 2017-08-30 ENCOUNTER — Encounter (HOSPITAL_COMMUNITY): Payer: Self-pay | Admitting: Interventional Radiology

## 2017-08-30 DIAGNOSIS — C169 Malignant neoplasm of stomach, unspecified: Secondary | ICD-10-CM | POA: Diagnosis not present

## 2017-08-30 DIAGNOSIS — Z452 Encounter for adjustment and management of vascular access device: Secondary | ICD-10-CM | POA: Insufficient documentation

## 2017-08-30 DIAGNOSIS — Z95828 Presence of other vascular implants and grafts: Secondary | ICD-10-CM

## 2017-08-30 HISTORY — PX: IR CV LINE INJECTION: IMG2294

## 2017-08-30 MED ORDER — IOPAMIDOL (ISOVUE-300) INJECTION 61%
INTRAVENOUS | Status: AC
  Administered 2017-08-30: 10 mL
  Filled 2017-08-30: qty 50

## 2017-08-30 MED ORDER — IOPAMIDOL (ISOVUE-300) INJECTION 61%
50.0000 mL | Freq: Once | INTRAVENOUS | Status: AC | PRN
  Administered 2017-08-30: 10 mL

## 2017-08-30 MED ORDER — HEPARIN SOD (PORK) LOCK FLUSH 100 UNIT/ML IV SOLN
INTRAVENOUS | Status: AC
  Filled 2017-08-30: qty 5

## 2017-09-07 ENCOUNTER — Other Ambulatory Visit: Payer: Self-pay | Admitting: Obstetrics & Gynecology

## 2017-09-07 DIAGNOSIS — Z1231 Encounter for screening mammogram for malignant neoplasm of breast: Secondary | ICD-10-CM

## 2017-09-07 SURGERY — HYSTERECTOMY, VAGINAL
Anesthesia: Choice | Laterality: Bilateral

## 2017-09-11 ENCOUNTER — Other Ambulatory Visit: Payer: Self-pay | Admitting: *Deleted

## 2017-09-11 DIAGNOSIS — C169 Malignant neoplasm of stomach, unspecified: Secondary | ICD-10-CM

## 2017-09-11 MED ORDER — HYDROCODONE-ACETAMINOPHEN 5-325 MG PO TABS: 1.0000 | ORAL_TABLET | Freq: Four times a day (QID) | ORAL | 0 refills | Status: DC | PRN

## 2017-09-26 ENCOUNTER — Ambulatory Visit: Payer: Medicare Other | Admitting: Obstetrics & Gynecology

## 2017-09-28 ENCOUNTER — Ambulatory Visit: Payer: Medicare Other

## 2017-09-28 ENCOUNTER — Other Ambulatory Visit: Payer: Self-pay | Admitting: General Surgery

## 2017-09-28 DIAGNOSIS — R1013 Epigastric pain: Secondary | ICD-10-CM

## 2017-09-28 DIAGNOSIS — Z85028 Personal history of other malignant neoplasm of stomach: Secondary | ICD-10-CM

## 2017-10-03 ENCOUNTER — Inpatient Hospital Stay: Payer: Medicare Other

## 2017-10-03 ENCOUNTER — Other Ambulatory Visit: Payer: Self-pay

## 2017-10-03 ENCOUNTER — Encounter: Payer: Self-pay | Admitting: Family

## 2017-10-03 ENCOUNTER — Inpatient Hospital Stay: Payer: Medicare Other | Attending: Hematology & Oncology | Admitting: Family

## 2017-10-03 VITALS — BP 105/59 | HR 72 | Temp 98.3°F | Resp 18

## 2017-10-03 DIAGNOSIS — N3000 Acute cystitis without hematuria: Secondary | ICD-10-CM

## 2017-10-03 DIAGNOSIS — M545 Low back pain: Secondary | ICD-10-CM | POA: Diagnosis not present

## 2017-10-03 DIAGNOSIS — K909 Intestinal malabsorption, unspecified: Secondary | ICD-10-CM

## 2017-10-03 DIAGNOSIS — D509 Iron deficiency anemia, unspecified: Secondary | ICD-10-CM | POA: Insufficient documentation

## 2017-10-03 DIAGNOSIS — Z85028 Personal history of other malignant neoplasm of stomach: Secondary | ICD-10-CM | POA: Diagnosis present

## 2017-10-03 DIAGNOSIS — Z903 Acquired absence of stomach [part of]: Secondary | ICD-10-CM | POA: Diagnosis not present

## 2017-10-03 DIAGNOSIS — R8290 Unspecified abnormal findings in urine: Secondary | ICD-10-CM | POA: Diagnosis not present

## 2017-10-03 DIAGNOSIS — R350 Frequency of micturition: Secondary | ICD-10-CM | POA: Insufficient documentation

## 2017-10-03 DIAGNOSIS — C169 Malignant neoplasm of stomach, unspecified: Secondary | ICD-10-CM

## 2017-10-03 DIAGNOSIS — Z95828 Presence of other vascular implants and grafts: Secondary | ICD-10-CM

## 2017-10-03 DIAGNOSIS — R82998 Other abnormal findings in urine: Secondary | ICD-10-CM

## 2017-10-03 DIAGNOSIS — D500 Iron deficiency anemia secondary to blood loss (chronic): Secondary | ICD-10-CM

## 2017-10-03 DIAGNOSIS — Z930 Tracheostomy status: Secondary | ICD-10-CM

## 2017-10-03 LAB — CMP (CANCER CENTER ONLY)
ALK PHOS: 68 U/L (ref 26–84)
ALT: 15 U/L (ref 10–47)
ANION GAP: 5 (ref 5–15)
AST: 21 U/L (ref 11–38)
Albumin: 3.2 g/dL — ABNORMAL LOW (ref 3.5–5.0)
BILIRUBIN TOTAL: 0.5 mg/dL (ref 0.2–1.6)
BUN: 10 mg/dL (ref 7–22)
CO2: 27 mmol/L (ref 18–33)
Calcium: 8.6 mg/dL (ref 8.0–10.3)
Chloride: 106 mmol/L (ref 98–108)
Creatinine: 0.9 mg/dL (ref 0.60–1.20)
Glucose, Bld: 149 mg/dL — ABNORMAL HIGH (ref 73–118)
POTASSIUM: 3.6 mmol/L (ref 3.3–4.7)
Sodium: 138 mmol/L (ref 128–145)
Total Protein: 6.5 g/dL (ref 6.4–8.1)

## 2017-10-03 LAB — CBC WITH DIFFERENTIAL (CANCER CENTER ONLY)
Basophils Absolute: 0 10*3/uL (ref 0.0–0.1)
Basophils Relative: 0 %
Eosinophils Absolute: 0.1 10*3/uL (ref 0.0–0.5)
Eosinophils Relative: 2 %
HEMATOCRIT: 34.8 % (ref 34.8–46.6)
HEMOGLOBIN: 11.1 g/dL — AB (ref 11.6–15.9)
LYMPHS ABS: 1.5 10*3/uL (ref 0.9–3.3)
Lymphocytes Relative: 28 %
MCH: 28.8 pg (ref 26.0–34.0)
MCHC: 31.9 g/dL — AB (ref 32.0–36.0)
MCV: 90.2 fL (ref 81.0–101.0)
MONO ABS: 0.5 10*3/uL (ref 0.1–0.9)
MONOS PCT: 9 %
NEUTROS ABS: 3.3 10*3/uL (ref 1.5–6.5)
NEUTROS PCT: 61 %
Platelet Count: 270 10*3/uL (ref 145–400)
RBC: 3.86 MIL/uL (ref 3.70–5.32)
RDW: 14.4 % (ref 11.1–15.7)
WBC Count: 5.4 10*3/uL (ref 3.9–10.0)

## 2017-10-03 LAB — URINALYSIS, COMPLETE (UACMP) WITH MICROSCOPIC
Bilirubin Urine: NEGATIVE
GLUCOSE, UA: NEGATIVE mg/dL
HGB URINE DIPSTICK: NEGATIVE
KETONES UR: NEGATIVE mg/dL
LEUKOCYTES UA: NEGATIVE
NITRITE: POSITIVE — AB
Protein, ur: NEGATIVE mg/dL
Specific Gravity, Urine: 1.025 (ref 1.005–1.030)
pH: 6 (ref 5.0–8.0)

## 2017-10-03 MED ORDER — SODIUM CHLORIDE 0.9% FLUSH
10.0000 mL | INTRAVENOUS | Status: DC | PRN
  Administered 2017-10-03: 10 mL via INTRAVENOUS
  Filled 2017-10-03: qty 10

## 2017-10-03 MED ORDER — HEPARIN SOD (PORK) LOCK FLUSH 100 UNIT/ML IV SOLN
500.0000 [IU] | Freq: Once | INTRAVENOUS | Status: AC
  Administered 2017-10-03: 500 [IU] via INTRAVENOUS
  Filled 2017-10-03: qty 5

## 2017-10-03 NOTE — Progress Notes (Signed)
Hematology and Oncology Follow Up Visit  Paula [name] [id number] 04/05/1965 52 y.o. 10/03/2017   Principle Diagnosis:  Stage IIA (T1a, N2, M0) gastric adenocarcinoma diagnosed in July of 2015 Intermittent iron deficiency anemia  Prior Therapy:  Status post diagnostic laparoscopy, distal gastrectomy with billroth II anastamosis and feeding gastrojejunostomy tube under the care of Dr. Barry Dienes on 09/20/2013 Adjuvant systemic chemotherapy with 5-FU 600 mg/M2 with leucovorin on days 1, 8, 15, 22, 29 and 36 every 8 weeks Adjuvant concurrent chemoradiation with Xeloda 650 mg/M2 by mouth twice a day for 5 days every week with radiation. The patient completed the course of radiotherapy but she declined to take Xeloda during her radiation  Current Therapy:   Observation IV iron as indicated    Interim History:  [name] is here today for follow-up. She is having some right sided lower back pain and states that her urine is cloudy with a strong odor as well as having urinary frequency. We will get a UA and culture today.  She is also symptomatic with fatigue. She rests as needed throughout the day.  No fever, chills, cough, rash, dizziness, SOB, chest pain, palpitations, abdominal pain or changes in bowel habits.  She still has some n/v at time depending on portion size and what she eats.  She is hydrating well and her weight is stable.  No swelling, tenderness, numbness or tingling in her extremities. No lymphadenopathy noted on exam.  No episodes of bleeding, no bruising or petechiae.  She is hoping to start exercising soon to build up strength and stamina.   ECOG Performance Status: 1 - Symptomatic but completely ambulatory  Medications:  Allergies as of 10/03/2017      Reactions   Tape Itching      Medication List        Accurate as of 10/03/17  1:29 PM. Always use your most recent med list.          carvedilol 6.25 MG tablet Commonly known as:  COREG Take  6.25 mg by mouth 2 (two) times daily with a meal.   HYDROcodone-acetaminophen 5-325 MG tablet Commonly known as:  NORCO/VICODIN Take 1 tablet by mouth every 6 (six) hours as needed for moderate pain.   lidocaine-prilocaine cream Commonly known as:  EMLA Apply 1 application topically as needed.   MULTIVITAMIN & MINERAL PO Take 1 tablet by mouth daily.   omeprazole 20 MG capsule Commonly known as:  PRILOSEC Take 20 mg by mouth daily.   ondansetron 4 MG tablet Commonly known as:  ZOFRAN Take 2 tablets (8 mg total) by mouth 2 (two) times daily.   polyethylene glycol powder powder Commonly known as:  GLYCOLAX/MIRALAX   promethazine 12.5 MG tablet Commonly known as:  PHENERGAN Take 12.5 mg by mouth every 6 (six) hours as needed for nausea or vomiting.       Allergies:  Allergies  Allergen Reactions  . Tape Itching    Past Medical History, Surgical history, Social history, and Family History were reviewed and updated.  Review of Systems: All other 10 point review of systems is negative.   Physical Exam:  oral temperature is 98.3 F (36.8 C). Her blood pressure is 105/59 (abnormal) and her pulse is 72. Her respiration is 18 and oxygen saturation is 100%.   Wt Readings from Last 3 Encounters:  08/16/17 161 lb 6.4 oz (73.2 kg)  07/11/17 162 lb (73.5 kg)  06/28/17 161 lb 1.3 oz (73.1 kg)    Ocular: Sclerae  unicteric, pupils equal, round and reactive to light Ear-nose-throat: Oropharynx clear, dentition fair Lymphatic: No cervical, supraclavicular or axillary adenopathy Lungs no rales or rhonchi, good excursion bilaterally Heart regular rate and rhythm, no murmur appreciated Abd soft, nontender, positive bowel sounds, no liver or spleen tip palpated on exam, no fluid wave  MSK no focal spinal tenderness, no joint edema Neuro: non-focal, well-oriented, appropriate affect Breasts: Deferred   Lab Results  Component Value Date   WBC 5.4 10/03/2017   HGB 11.1 (L)  10/03/2017   HCT 34.8 10/03/2017   MCV 90.2 10/03/2017   PLT 270 10/03/2017   Lab Results  Component Value Date   FERRITIN 81 07/11/2017   IRON 76 07/11/2017   TIBC 255 07/11/2017   UIBC 179 07/11/2017   IRONPCTSAT 30 07/11/2017   Lab Results  Component Value Date   RBC 3.86 10/03/2017   No results found for: [facility] Lab Results  Component Value Date   IGGSERUM 1,273 05/21/2015   IGMSERUM 40 05/21/2015   No results found for: Odetta Pink, SPEI   Chemistry      Component Value Date/Time   NA 138 10/03/2017 1143   NA 140 01/26/2017 1002   NA 140 09/24/2015 0953   K 3.6 10/03/2017 1143   K 3.5 01/26/2017 1002   K 4.0 09/24/2015 0953   CL 106 10/03/2017 1143   CL 106 01/26/2017 1002   CO2 27 10/03/2017 1143   CO2 26 01/26/2017 1002   CO2 23 09/24/2015 0953   BUN 10 10/03/2017 1143   BUN 6 (L) 01/26/2017 1002   BUN 8.6 09/24/2015 0953   CREATININE 0.90 10/03/2017 1143   CREATININE 1.1 01/26/2017 1002   CREATININE 1.0 09/24/2015 0953      Component Value Date/Time   CALCIUM 8.6 10/03/2017 1143   CALCIUM 8.9 01/26/2017 1002   CALCIUM 9.6 09/24/2015 0953   ALKPHOS 68 10/03/2017 1143   ALKPHOS 68 01/26/2017 1002   ALKPHOS 99 09/24/2015 0953   AST 21 10/03/2017 1143   AST 17 09/24/2015 0953   ALT 15 10/03/2017 1143   ALT 17 01/26/2017 1002   ALT 11 09/24/2015 0953   BILITOT 0.5 10/03/2017 1143   BILITOT 0.83 09/24/2015 0953      Impression and Plan: [name] is a very pleasant 52 yo African American female with history of stage II gastric cancer and partial gastrectomy followed by adjuvant chemo and radiation. She continues to do well and so far there has been no evidence of recurrence.  She is symptomatic with what sounds like a UTI. UA and culture results are pending. We will get her onto an antibiotic if needed.  Her infusion nurse today was able to access her port  without difficulty for labs.  We will go ahead and plan to see her back in 3 months for follow-up with port flush every 6 weeks.  She will contact our office with any questions or concerns. We can certainly see her sooner if need be.   Laverna Peace, NP 8/13/20191:29 PM

## 2017-10-04 LAB — IRON AND TIBC
IRON: 50 ug/dL (ref 41–142)
Saturation Ratios: 20 % — ABNORMAL LOW (ref 21–57)
TIBC: 258 ug/dL (ref 236–444)
UIBC: 207 ug/dL

## 2017-10-04 LAB — FERRITIN: Ferritin: 59 ng/mL (ref 11–307)

## 2017-10-05 ENCOUNTER — Other Ambulatory Visit: Payer: Self-pay | Admitting: Family

## 2017-10-05 ENCOUNTER — Inpatient Hospital Stay: Payer: Medicare Other

## 2017-10-05 VITALS — BP 114/72 | HR 72 | Temp 98.1°F | Resp 16

## 2017-10-05 DIAGNOSIS — Z85028 Personal history of other malignant neoplasm of stomach: Secondary | ICD-10-CM | POA: Diagnosis not present

## 2017-10-05 DIAGNOSIS — D500 Iron deficiency anemia secondary to blood loss (chronic): Secondary | ICD-10-CM

## 2017-10-05 LAB — URINE CULTURE: Culture: >=100000 — AB

## 2017-10-05 MED ORDER — SODIUM CHLORIDE 0.9 % IV SOLN
Freq: Once | INTRAVENOUS | Status: AC
  Administered 2017-10-05: 15:00:00 via INTRAVENOUS
  Filled 2017-10-05: qty 250

## 2017-10-05 MED ORDER — SODIUM CHLORIDE 0.9 % IV SOLN
510.0000 mg | Freq: Once | INTRAVENOUS | Status: AC
  Administered 2017-10-05: 510 mg via INTRAVENOUS
  Filled 2017-10-05: qty 17

## 2017-10-05 MED ORDER — NITROFURANTOIN MONOHYD MACRO 100 MG PO CAPS: 100.0000 mg | ORAL_CAPSULE | Freq: Two times a day (BID) | ORAL | 0 refills | Status: DC

## 2017-10-05 NOTE — Patient Instructions (Signed)

## 2017-10-09 ENCOUNTER — Ambulatory Visit
Admission: RE | Admit: 2017-10-09 | Discharge: 2017-10-09 | Disposition: A | Discharge status: Home / Self Care | Payer: Medicare Other | Source: Ambulatory Visit | Attending: General Surgery | Admitting: General Surgery

## 2017-10-09 DIAGNOSIS — Z85028 Personal history of other malignant neoplasm of stomach: Secondary | ICD-10-CM

## 2017-10-09 DIAGNOSIS — R1013 Epigastric pain: Secondary | ICD-10-CM

## 2017-10-09 MED ORDER — IOPAMIDOL (ISOVUE-300) INJECTION 61%
100.0000 mL | Freq: Once | INTRAVENOUS | Status: AC | PRN
  Administered 2017-10-09: 100 mL via INTRAVENOUS

## 2017-10-10 ENCOUNTER — Ambulatory Visit: Payer: Medicare Other

## 2017-10-11 ENCOUNTER — Other Ambulatory Visit: Payer: Self-pay | Admitting: *Deleted

## 2017-10-11 ENCOUNTER — Ambulatory Visit (INDEPENDENT_AMBULATORY_CARE_PROVIDER_SITE_OTHER): Payer: Medicare Other

## 2017-10-11 VITALS — Wt 161.9 lb

## 2017-10-11 DIAGNOSIS — Z3042 Encounter for surveillance of injectable contraceptive: Secondary | ICD-10-CM | POA: Diagnosis not present

## 2017-10-11 DIAGNOSIS — C169 Malignant neoplasm of stomach, unspecified: Secondary | ICD-10-CM

## 2017-10-11 MED ORDER — HYDROCODONE-ACETAMINOPHEN 5-325 MG PO TABS: 1.0000 | ORAL_TABLET | Freq: Four times a day (QID) | ORAL | 0 refills | Status: DC | PRN

## 2017-10-11 NOTE — Progress Notes (Signed)
Paula [name] here for Depo-Provera  Injection.  Injection administered without complication. Patient will return in 3 months for next injection.  [name], [place] 10/11/2017  11:23 AM

## 2017-10-12 NOTE — Progress Notes (Signed)
I have reviewed the chart and agree with nursing staff's documentation of this patient's encounter.  Mora Bellman, MD 10/12/2017 9:13 AM

## 2017-10-25 ENCOUNTER — Ambulatory Visit: Payer: Medicare Other | Admitting: Obstetrics & Gynecology

## 2017-10-26 ENCOUNTER — Ambulatory Visit: Payer: Medicare Other

## 2017-10-26 NOTE — Patient Instructions (Addendum)
Your procedure is scheduled on: Thursday September 19 ,2019 at 10:00 am  Enter through the Main Entrance of [facility] at: 8:30 am  Pick up the phone at the desk and dial [phone].  Call this number if you have problems the morning of surgery: [phone].  Remember: Do NOT eat food or drink any liquids after: Midnight on Wednesday September 18   Take these medicines the morning of surgery with a SIP OF WATER: Carvedilol, Prilosec.  May take Zofran, Phenergan  If needed    STOP ALL VITAMINS, SUPPLEMENTS, HERBAL MEDICATIONS NOW  DO NOT SMOKE DAY OF SURGERY  BRUSH YOUR TEETH DAY OF SURGERY  Do NOT wear jewelry (body piercing), metal hair clips/bobby pins, make-up, or nail polish. Do NOT wear lotions, powders, or perfumes.  You may wear deoderant. Do NOT shave for 48 hours prior to surgery. Do NOT bring valuables to the hospital. Contacts, dentures, or bridgework may not be worn into surgery. Leave suitcase in car.  After surgery it may be brought to your room.    For patients admitted to the hospital, checkout time is 11:00 AM the day of discharge.

## 2017-10-27 ENCOUNTER — Ambulatory Visit: Payer: Medicare Other

## 2017-10-27 ENCOUNTER — Ambulatory Visit (HOSPITAL_COMMUNITY)
Admission: AD | Admit: 2017-10-27 | Payer: Medicare Other | Source: Ambulatory Visit | Admitting: Obstetrics & Gynecology

## 2017-10-27 ENCOUNTER — Encounter (HOSPITAL_COMMUNITY)
Admission: RE | Admit: 2017-10-27 | Discharge: 2017-10-27 | Disposition: A | Discharge status: Home / Self Care | Payer: Medicare Other | Source: Ambulatory Visit | Attending: Obstetrics & Gynecology | Admitting: Obstetrics & Gynecology

## 2017-10-27 ENCOUNTER — Other Ambulatory Visit: Payer: Self-pay

## 2017-10-27 ENCOUNTER — Encounter (HOSPITAL_COMMUNITY): Payer: Self-pay

## 2017-10-27 DIAGNOSIS — Z01812 Encounter for preprocedural laboratory examination: Secondary | ICD-10-CM | POA: Insufficient documentation

## 2017-10-27 HISTORY — DX: Respiratory tuberculosis unspecified: A15.9

## 2017-10-27 HISTORY — DX: Unspecified osteoarthritis, unspecified site: M19.90

## 2017-10-27 HISTORY — DX: Personal history of other diseases of the digestive system: Z87.19

## 2017-10-27 LAB — COMPREHENSIVE METABOLIC PANEL
ALBUMIN: 3.8 g/dL (ref 3.5–5.0)
ALK PHOS: 65 U/L (ref 38–126)
ALT: 14 U/L (ref 0–44)
AST: 17 U/L (ref 15–41)
Anion gap: 12 (ref 5–15)
BUN: 10 mg/dL (ref 6–20)
CALCIUM: 8.7 mg/dL — AB (ref 8.9–10.3)
CHLORIDE: 106 mmol/L (ref 98–111)
CO2: 22 mmol/L (ref 22–32)
CREATININE: 0.95 mg/dL (ref 0.44–1.00)
GFR calc Af Amer: >60 mL/min (ref >60)
GFR calc non Af Amer: >60 mL/min (ref >60)
GLUCOSE: 81 mg/dL (ref 70–99)
Potassium: 3.7 mmol/L (ref 3.5–5.1)
SODIUM: 140 mmol/L (ref 135–145)
Total Bilirubin: 0.7 mg/dL (ref 0.3–1.2)
Total Protein: 7.2 g/dL (ref 6.5–8.1)

## 2017-10-27 LAB — TYPE AND SCREEN
ABO/RH(D): B POS
ANTIBODY SCREEN: NEGATIVE

## 2017-10-27 LAB — CBC
HCT: 36.4 % (ref 36.0–46.0)
HEMOGLOBIN: 11.8 g/dL — AB (ref 12.0–15.0)
MCH: 29 pg (ref 26.0–34.0)
MCHC: 32.4 g/dL (ref 30.0–36.0)
MCV: 89.4 fL (ref 78.0–100.0)
Platelets: 228 10*3/uL (ref 150–400)
RBC: 4.07 MIL/uL (ref 3.87–5.11)
RDW: 14.5 % (ref 11.5–15.5)
WBC: 4.5 10*3/uL (ref 4.0–10.5)

## 2017-10-27 LAB — ABO/RH: ABO/RH(D): B POS

## 2017-10-30 ENCOUNTER — Ambulatory Visit
Admission: RE | Admit: 2017-10-30 | Discharge: 2017-10-30 | Disposition: A | Discharge status: Home / Self Care | Payer: Medicare Other | Source: Ambulatory Visit | Attending: Obstetrics & Gynecology | Admitting: Obstetrics & Gynecology

## 2017-10-30 DIAGNOSIS — Z1231 Encounter for screening mammogram for malignant neoplasm of breast: Secondary | ICD-10-CM

## 2017-10-31 ENCOUNTER — Telehealth: Payer: Self-pay | Admitting: Obstetrics & Gynecology

## 2017-10-31 NOTE — Telephone Encounter (Signed)
Faculty Practice OB/GYN Physician Phone Call Documentation  I called Paula [name] and discussed upcoming surgery with her. After discussion, we decided to change her surgery to laparoscopic assisted vaginal hysterectomy, bilateral salpingectomy; especially due to history of abdominal radiation after stomach cancer, ?bowel surgery.  Hopefully it will be a quick diagnostic laparoscopy followed by [facility]; if not, adhesions can be lysed laparoscopically if possible.  Patient agrees with this plan. OR called, and change made. Patient will follow up as scheduled on 11/09/2017.    Verita Schneiders, MD, [place] for [employer], [place]

## 2017-10-31 NOTE — Telephone Encounter (Signed)
Patient is trying to reach Dr Harolyn Rutherford. She wants to speak to her about the type of surgery she is having, and is requesting a call back ASAP please.

## 2017-10-31 NOTE — Addendum Note (Signed)
Addended by: Verita Schneiders A on: 10/31/2017 04:22 PM   Modules accepted: Orders

## 2017-11-08 ENCOUNTER — Ambulatory Visit (INDEPENDENT_AMBULATORY_CARE_PROVIDER_SITE_OTHER): Payer: Medicare Other | Admitting: Family Medicine

## 2017-11-08 ENCOUNTER — Encounter: Payer: Self-pay | Admitting: Family Medicine

## 2017-11-08 VITALS — BP 120/75 | HR 82 | Ht 60.0 in | Wt 160.0 lb

## 2017-11-08 DIAGNOSIS — M25562 Pain in left knee: Secondary | ICD-10-CM | POA: Diagnosis not present

## 2017-11-08 NOTE — Patient Instructions (Signed)
These are the different medications you can take for this: Tylenol 500mg  1-2 tabs three times a day for pain. Capsaicin, aspercreme, or biofreeze topically up to four times a day may also help with pain. Salon pas patches. Some supplements that may help for arthritis: Boswellia extract, curcumin, pycnogenol Avoid ibuprofen, aleve, and aspirin because of surgery - check with the surgeon on when she would be ok with you taking these after surgery. Cortisone injections are an option but would wait until 4 weeks after surgery at the earliest. It's important that you continue to stay active. Straight leg raises, knee extensions 3 sets of 10 once a day (add ankle weight if these become too easy). Consider physical therapy to strengthen muscles around the joint that hurts to take pressure off of the joint itself. Shoe inserts with good arch support may be helpful. Heat or ice 15 minutes at a time 3-4 times a day as needed to help with pain. Follow up with me 4 weeks after surgery if you're still having problems.

## 2017-11-09 ENCOUNTER — Encounter (HOSPITAL_COMMUNITY): Payer: Self-pay

## 2017-11-09 ENCOUNTER — Encounter (HOSPITAL_COMMUNITY): Payer: Self-pay | Admitting: *Deleted

## 2017-11-09 ENCOUNTER — Ambulatory Visit (HOSPITAL_COMMUNITY): Payer: Medicare Other | Admitting: Certified Registered Nurse Anesthetist

## 2017-11-09 ENCOUNTER — Observation Stay (HOSPITAL_COMMUNITY)
Admission: AD | Admit: 2017-11-09 | Discharge: 2017-11-10 | Disposition: A | Discharge status: Home / Self Care | Payer: Medicare Other | Source: Ambulatory Visit | Attending: Obstetrics & Gynecology | Admitting: Obstetrics & Gynecology

## 2017-11-09 ENCOUNTER — Encounter (HOSPITAL_COMMUNITY)
Admission: AD | Disposition: A | Discharge status: Home / Self Care | Payer: Self-pay | Source: Ambulatory Visit | Attending: Obstetrics & Gynecology

## 2017-11-09 ENCOUNTER — Other Ambulatory Visit: Payer: Self-pay

## 2017-11-09 DIAGNOSIS — Z923 Personal history of irradiation: Secondary | ICD-10-CM | POA: Insufficient documentation

## 2017-11-09 DIAGNOSIS — Z86711 Personal history of pulmonary embolism: Secondary | ICD-10-CM | POA: Diagnosis present

## 2017-11-09 DIAGNOSIS — I10 Essential (primary) hypertension: Secondary | ICD-10-CM | POA: Insufficient documentation

## 2017-11-09 DIAGNOSIS — Z9221 Personal history of antineoplastic chemotherapy: Secondary | ICD-10-CM | POA: Insufficient documentation

## 2017-11-09 DIAGNOSIS — Z85028 Personal history of other malignant neoplasm of stomach: Secondary | ICD-10-CM | POA: Insufficient documentation

## 2017-11-09 DIAGNOSIS — N800 Endometriosis of uterus: Secondary | ICD-10-CM | POA: Diagnosis not present

## 2017-11-09 DIAGNOSIS — Z87891 Personal history of nicotine dependence: Secondary | ICD-10-CM | POA: Diagnosis not present

## 2017-11-09 DIAGNOSIS — Z90710 Acquired absence of both cervix and uterus: Secondary | ICD-10-CM | POA: Diagnosis present

## 2017-11-09 DIAGNOSIS — N838 Other noninflammatory disorders of ovary, fallopian tube and broad ligament: Secondary | ICD-10-CM | POA: Diagnosis not present

## 2017-11-09 DIAGNOSIS — Z9049 Acquired absence of other specified parts of digestive tract: Secondary | ICD-10-CM | POA: Insufficient documentation

## 2017-11-09 DIAGNOSIS — N939 Abnormal uterine and vaginal bleeding, unspecified: Principal | ICD-10-CM | POA: Diagnosis present

## 2017-11-09 DIAGNOSIS — K219 Gastro-esophageal reflux disease without esophagitis: Secondary | ICD-10-CM | POA: Diagnosis not present

## 2017-11-09 DIAGNOSIS — D259 Leiomyoma of uterus, unspecified: Secondary | ICD-10-CM | POA: Diagnosis present

## 2017-11-09 DIAGNOSIS — D500 Iron deficiency anemia secondary to blood loss (chronic): Secondary | ICD-10-CM | POA: Diagnosis present

## 2017-11-09 DIAGNOSIS — Z79899 Other long term (current) drug therapy: Secondary | ICD-10-CM | POA: Insufficient documentation

## 2017-11-09 DIAGNOSIS — C169 Malignant neoplasm of stomach, unspecified: Secondary | ICD-10-CM | POA: Diagnosis present

## 2017-11-09 HISTORY — PX: UNILATERAL SALPINGECTOMY: SHX6160

## 2017-11-09 HISTORY — PX: VAGINAL HYSTERECTOMY: SHX2639

## 2017-11-09 HISTORY — DX: Presence of dental prosthetic device (complete) (partial): Z97.2

## 2017-11-09 LAB — TYPE AND SCREEN
ABO/RH(D): B POS
ANTIBODY SCREEN: NEGATIVE

## 2017-11-09 LAB — PREGNANCY, URINE: Preg Test, Ur: NEGATIVE

## 2017-11-09 SURGERY — HYSTERECTOMY, VAGINAL
Anesthesia: General | Site: Vagina | Laterality: Left

## 2017-11-09 MED ORDER — SCOPOLAMINE 1 MG/3DAYS TD PT72
1.0000 | MEDICATED_PATCH | Freq: Once | TRANSDERMAL | Status: DC
  Administered 2017-11-09: 1.5 mg via TRANSDERMAL

## 2017-11-09 MED ORDER — LIDOCAINE HCL (CARDIAC) PF 100 MG/5ML IV SOSY
PREFILLED_SYRINGE | INTRAVENOUS | Status: DC | PRN
  Administered 2017-11-09: 80 mg via INTRAVENOUS

## 2017-11-09 MED ORDER — HYDROMORPHONE HCL 2 MG PO TABS: 4.0000 mg | ORAL_TABLET | ORAL | Status: DC | PRN

## 2017-11-09 MED ORDER — PROPOFOL 10 MG/ML IV BOLUS
INTRAVENOUS | Status: AC
  Filled 2017-11-09: qty 20

## 2017-11-09 MED ORDER — LACTATED RINGERS IV SOLN
INTRAVENOUS | Status: DC
  Administered 2017-11-09 – 2017-11-10 (×2): via INTRAVENOUS

## 2017-11-09 MED ORDER — ONDANSETRON HCL 4 MG PO TABS: 4.0000 mg | ORAL_TABLET | Freq: Four times a day (QID) | ORAL | Status: DC | PRN

## 2017-11-09 MED ORDER — PANTOPRAZOLE SODIUM 40 MG PO TBEC
40.0000 mg | DELAYED_RELEASE_TABLET | Freq: Every day | ORAL | Status: DC
  Administered 2017-11-09 – 2017-11-10 (×2): 40 mg via ORAL
  Filled 2017-11-09 (×2): qty 1

## 2017-11-09 MED ORDER — FENTANYL CITRATE (PF) 100 MCG/2ML IJ SOLN
INTRAMUSCULAR | Status: AC
  Filled 2017-11-09: qty 2

## 2017-11-09 MED ORDER — OXYCODONE HCL 5 MG PO TABS: 5.0000 mg | ORAL_TABLET | Freq: Once | ORAL | Status: DC | PRN

## 2017-11-09 MED ORDER — KETOROLAC TROMETHAMINE 30 MG/ML IJ SOLN
INTRAMUSCULAR | Status: AC
Start: 2017-11-09 — End: ?
  Filled 2017-11-09: qty 1

## 2017-11-09 MED ORDER — PHENYLEPHRINE HCL 10 MG/ML IJ SOLN
INTRAMUSCULAR | Status: DC | PRN
  Administered 2017-11-09: 40 ug via INTRAVENOUS

## 2017-11-09 MED ORDER — SODIUM CHLORIDE 0.9 % IV SOLN
2.0000 g | INTRAVENOUS | Status: AC
  Administered 2017-11-09: 2 g via INTRAVENOUS

## 2017-11-09 MED ORDER — ROCURONIUM BROMIDE 100 MG/10ML IV SOLN
INTRAVENOUS | Status: DC | PRN
  Administered 2017-11-09: 50 mg via INTRAVENOUS

## 2017-11-09 MED ORDER — SENNOSIDES-DOCUSATE SODIUM 8.6-50 MG PO TABS: 1.0000 | ORAL_TABLET | Freq: Every evening | ORAL | Status: DC | PRN

## 2017-11-09 MED ORDER — HYDROMORPHONE HCL 2 MG PO TABS: 2.0000 mg | ORAL_TABLET | ORAL | Status: DC | PRN

## 2017-11-09 MED ORDER — ONDANSETRON HCL 4 MG/2ML IJ SOLN: 4.0000 mg | Freq: Once | INTRAMUSCULAR | Status: DC | PRN

## 2017-11-09 MED ORDER — LIDOCAINE HCL (CARDIAC) PF 100 MG/5ML IV SOSY
PREFILLED_SYRINGE | INTRAVENOUS | Status: AC
Start: 2017-11-09 — End: ?
  Filled 2017-11-09: qty 5

## 2017-11-09 MED ORDER — ENOXAPARIN SODIUM 40 MG/0.4ML ~~LOC~~ SOLN
40.0000 mg | SUBCUTANEOUS | Status: DC
  Administered 2017-11-10: 40 mg via SUBCUTANEOUS
  Filled 2017-11-09: qty 0.4

## 2017-11-09 MED ORDER — MIDAZOLAM HCL 2 MG/2ML IJ SOLN
INTRAMUSCULAR | Status: AC
  Filled 2017-11-09: qty 2

## 2017-11-09 MED ORDER — HYDROMORPHONE HCL 2 MG PO TABS: 1.0000 mg | ORAL_TABLET | ORAL | Status: DC | PRN

## 2017-11-09 MED ORDER — CARVEDILOL 6.25 MG PO TABS
6.2500 mg | ORAL_TABLET | Freq: Two times a day (BID) | ORAL | Status: DC
  Administered 2017-11-09 – 2017-11-10 (×2): 6.25 mg via ORAL
  Filled 2017-11-09 (×6): qty 1

## 2017-11-09 MED ORDER — OXYCODONE HCL 5 MG/5ML PO SOLN: 5.0000 mg | Freq: Once | ORAL | Status: DC | PRN

## 2017-11-09 MED ORDER — ALUM & MAG HYDROXIDE-SIMETH 200-200-20 MG/5ML PO SUSP: 30.0000 mL | ORAL | Status: DC | PRN

## 2017-11-09 MED ORDER — ONDANSETRON HCL 4 MG/2ML IJ SOLN
INTRAMUSCULAR | Status: AC
  Filled 2017-11-09: qty 2

## 2017-11-09 MED ORDER — FENTANYL CITRATE (PF) 250 MCG/5ML IJ SOLN
INTRAMUSCULAR | Status: AC
  Filled 2017-11-09: qty 5

## 2017-11-09 MED ORDER — SIMETHICONE 80 MG PO CHEW: 80.0000 mg | CHEWABLE_TABLET | Freq: Four times a day (QID) | ORAL | Status: DC | PRN

## 2017-11-09 MED ORDER — FENTANYL CITRATE (PF) 100 MCG/2ML IJ SOLN
INTRAMUSCULAR | Status: DC | PRN
  Administered 2017-11-09: 100 ug via INTRAVENOUS
  Administered 2017-11-09: 50 ug via INTRAVENOUS

## 2017-11-09 MED ORDER — ONDANSETRON HCL 4 MG/2ML IJ SOLN: 4.0000 mg | Freq: Four times a day (QID) | INTRAMUSCULAR | Status: DC | PRN

## 2017-11-09 MED ORDER — BUPIVACAINE-EPINEPHRINE (PF) 0.5% -1:200000 IJ SOLN
INTRAMUSCULAR | Status: AC
  Filled 2017-11-09: qty 30

## 2017-11-09 MED ORDER — ACETAMINOPHEN 160 MG/5ML PO SOLN: 325.0000 mg | ORAL | Status: DC | PRN

## 2017-11-09 MED ORDER — HYDROMORPHONE HCL 1 MG/ML IJ SOLN
1.0000 mg | INTRAMUSCULAR | Status: DC | PRN
  Administered 2017-11-09 (×2): 1 mg via INTRAVENOUS
  Administered 2017-11-09: 2 mg via INTRAVENOUS
  Administered 2017-11-09 – 2017-11-10 (×2): 1 mg via INTRAVENOUS
  Filled 2017-11-09: qty 1
  Filled 2017-11-09: qty 2
  Filled 2017-11-09 (×3): qty 1

## 2017-11-09 MED ORDER — SUGAMMADEX SODIUM 200 MG/2ML IV SOLN
INTRAVENOUS | Status: AC
  Filled 2017-11-09: qty 2

## 2017-11-09 MED ORDER — MAGNESIUM CITRATE PO SOLN
1.0000 | Freq: Once | ORAL | Status: DC | PRN
  Filled 2017-11-09: qty 296

## 2017-11-09 MED ORDER — MENTHOL 3 MG MT LOZG
1.0000 | LOZENGE | OROMUCOSAL | Status: DC | PRN
  Administered 2017-11-09: 3 mg via ORAL
  Filled 2017-11-09: qty 9

## 2017-11-09 MED ORDER — SODIUM CHLORIDE 0.9 % IV SOLN
INTRAVENOUS | Status: AC
  Filled 2017-11-09: qty 2

## 2017-11-09 MED ORDER — FENTANYL CITRATE (PF) 100 MCG/2ML IJ SOLN
25.0000 ug | INTRAMUSCULAR | Status: DC | PRN
  Administered 2017-11-09 (×3): 50 ug via INTRAVENOUS

## 2017-11-09 MED ORDER — BISACODYL 10 MG RE SUPP: 10.0000 mg | Freq: Every day | RECTAL | Status: DC | PRN

## 2017-11-09 MED ORDER — DEXAMETHASONE SODIUM PHOSPHATE 10 MG/ML IJ SOLN
INTRAMUSCULAR | Status: DC | PRN
  Administered 2017-11-09: 4 mg via INTRAVENOUS

## 2017-11-09 MED ORDER — PROPOFOL 10 MG/ML IV BOLUS
INTRAVENOUS | Status: DC | PRN
  Administered 2017-11-09: 120 mg via INTRAVENOUS

## 2017-11-09 MED ORDER — ACETAMINOPHEN 325 MG PO TABS: 325.0000 mg | ORAL_TABLET | ORAL | Status: DC | PRN

## 2017-11-09 MED ORDER — LACTATED RINGERS IV SOLN
INTRAVENOUS | Status: DC
  Administered 2017-11-09 (×2): via INTRAVENOUS

## 2017-11-09 MED ORDER — BUPIVACAINE-EPINEPHRINE 0.5% -1:200000 IJ SOLN
INTRAMUSCULAR | Status: DC | PRN
  Administered 2017-11-09: 30 mL

## 2017-11-09 MED ORDER — ZOLPIDEM TARTRATE 5 MG PO TABS: 5.0000 mg | ORAL_TABLET | Freq: Every evening | ORAL | Status: DC | PRN

## 2017-11-09 MED ORDER — SCOPOLAMINE 1 MG/3DAYS TD PT72
MEDICATED_PATCH | TRANSDERMAL | Status: AC
  Administered 2017-11-09: 1.5 mg via TRANSDERMAL
  Filled 2017-11-09: qty 1

## 2017-11-09 MED ORDER — HYDROMORPHONE HCL 2 MG PO TABS
2.0000 mg | ORAL_TABLET | ORAL | Status: DC | PRN
  Administered 2017-11-10 (×2): 2 mg via ORAL
  Filled 2017-11-09 (×2): qty 1

## 2017-11-09 MED ORDER — ONDANSETRON HCL 4 MG/2ML IJ SOLN
INTRAMUSCULAR | Status: DC | PRN
  Administered 2017-11-09: 4 mg via INTRAVENOUS

## 2017-11-09 MED ORDER — MEPERIDINE HCL 25 MG/ML IJ SOLN: 6.2500 mg | INTRAMUSCULAR | Status: DC | PRN

## 2017-11-09 MED ORDER — SUGAMMADEX SODIUM 200 MG/2ML IV SOLN
INTRAVENOUS | Status: DC | PRN
  Administered 2017-11-09: 145.2 mg via INTRAVENOUS

## 2017-11-09 MED ORDER — DEXAMETHASONE SODIUM PHOSPHATE 4 MG/ML IJ SOLN
INTRAMUSCULAR | Status: AC
  Filled 2017-11-09: qty 1

## 2017-11-09 MED ORDER — BUPIVACAINE HCL (PF) 0.5 % IJ SOLN
INTRAMUSCULAR | Status: AC
  Filled 2017-11-09: qty 30

## 2017-11-09 MED ORDER — DOCUSATE SODIUM 100 MG PO CAPS
100.0000 mg | ORAL_CAPSULE | Freq: Two times a day (BID) | ORAL | Status: DC
  Administered 2017-11-09 – 2017-11-10 (×2): 100 mg via ORAL
  Filled 2017-11-09 (×2): qty 1

## 2017-11-09 MED ORDER — ROCURONIUM BROMIDE 100 MG/10ML IV SOLN
INTRAVENOUS | Status: AC
  Filled 2017-11-09: qty 1

## 2017-11-09 MED ORDER — MIDAZOLAM HCL 2 MG/2ML IJ SOLN
INTRAMUSCULAR | Status: DC | PRN
  Administered 2017-11-09: 2 mg via INTRAVENOUS

## 2017-11-09 MED ORDER — FENTANYL CITRATE (PF) 100 MCG/2ML IJ SOLN
INTRAMUSCULAR | Status: AC
  Administered 2017-11-09: 50 ug via INTRAVENOUS
  Filled 2017-11-09: qty 2

## 2017-11-09 SURGICAL SUPPLY — 44 items
APPLICATOR COTTON TIP 6 STRL (MISCELLANEOUS) ×3 IMPLANT
APPLICATOR COTTON TIP 6IN STRL (MISCELLANEOUS) ×4
CABLE HIGH FREQUENCY MONO STRZ (ELECTRODE) IMPLANT
CANISTER SUCT 3000ML PPV (MISCELLANEOUS) ×4 IMPLANT
CONT PATH 16OZ SNAP LID 3702 (MISCELLANEOUS) ×4 IMPLANT
COVER BACK TABLE 60X90IN (DRAPES) ×4 IMPLANT
COVER MAYO STAND STRL (DRAPES) ×4 IMPLANT
DECANTER SPIKE VIAL GLASS SM (MISCELLANEOUS) ×8 IMPLANT
DERMABOND ADVANCED (GAUZE/BANDAGES/DRESSINGS)
DERMABOND ADVANCED .7 DNX12 (GAUZE/BANDAGES/DRESSINGS) IMPLANT
DRSG OPSITE POSTOP 3X4 (GAUZE/BANDAGES/DRESSINGS) ×4 IMPLANT
DURAPREP 26ML APPLICATOR (WOUND CARE) ×4 IMPLANT
ELECT REM PT RETURN 9FT ADLT (ELECTROSURGICAL)
ELECTRODE REM PT RTRN 9FT ADLT (ELECTROSURGICAL) IMPLANT
FILTER SMOKE EVAC LAPAROSHD (FILTER) ×4 IMPLANT
GAUZE PACKING 2X5 YD STRL (GAUZE/BANDAGES/DRESSINGS) ×4 IMPLANT
GLOVE BIOGEL PI IND STRL 6.5 (GLOVE) ×3 IMPLANT
GLOVE BIOGEL PI IND STRL 7.0 (GLOVE) ×15 IMPLANT
GLOVE BIOGEL PI INDICATOR 6.5 (GLOVE) ×1
GLOVE BIOGEL PI INDICATOR 7.0 (GLOVE) ×5
GLOVE ECLIPSE 7.0 STRL STRAW (GLOVE) ×8 IMPLANT
LEGGING LITHOTOMY PAIR STRL (DRAPES) ×4 IMPLANT
NEEDLE MAYO CATGUT SZ4 (NEEDLE) IMPLANT
NS IRRIG 1000ML POUR BTL (IV SOLUTION) ×4 IMPLANT
PACK LAVH (CUSTOM PROCEDURE TRAY) ×4 IMPLANT
PACK ROBOTIC GOWN (GOWN DISPOSABLE) ×4 IMPLANT
PACK TRENDGUARD 450 HYBRID PRO (MISCELLANEOUS) ×3 IMPLANT
PROTECTOR NERVE ULNAR (MISCELLANEOUS) ×8 IMPLANT
SET IRRIG TUBING LAPAROSCOPIC (IRRIGATION / IRRIGATOR) IMPLANT
SHEARS HARMONIC ACE PLUS 36CM (ENDOMECHANICALS) IMPLANT
SLEEVE XCEL OPT CAN 5 100 (ENDOMECHANICALS) ×4 IMPLANT
SUT VIC AB 0 CT1 18XCR BRD8 (SUTURE) ×6 IMPLANT
SUT VIC AB 0 CT1 36 (SUTURE) ×8 IMPLANT
SUT VIC AB 0 CT1 8-18 (SUTURE) ×2
SUT VIC AB 4-0 PS2 27 (SUTURE) ×8 IMPLANT
SUT VICRYL 0 TIES 12 18 (SUTURE) ×4 IMPLANT
SUT VICRYL 0 UR6 27IN ABS (SUTURE) ×4 IMPLANT
TOWEL OR 17X24 6PK STRL BLUE (TOWEL DISPOSABLE) ×12 IMPLANT
TRAY FOLEY W/BAG SLVR 14FR (SET/KITS/TRAYS/PACK) ×4 IMPLANT
TRENDGUARD 450 HYBRID PRO PACK (MISCELLANEOUS) ×4
TROCAR XCEL NON-BLD 11X100MML (ENDOMECHANICALS) IMPLANT
TROCAR XCEL NON-BLD 5MMX100MML (ENDOMECHANICALS) ×4 IMPLANT
TUBING INSUF HEATED (TUBING) ×4 IMPLANT
WARMER LAPAROSCOPE (MISCELLANEOUS) ×4 IMPLANT

## 2017-11-09 NOTE — Anesthesia Procedure Notes (Signed)
Procedure Name: Intubation Date/Time: 11/09/2017 10:18 AM Performed by: Hewitt Blade, CRNA Pre-anesthesia Checklist: Patient identified, Emergency Drugs available, Suction available and Patient being monitored Patient Re-evaluated:Patient Re-evaluated prior to induction Oxygen Delivery Method: Circle system utilized Preoxygenation: Pre-oxygenation with 100% oxygen Induction Type: IV induction Ventilation: Mask ventilation without difficulty Laryngoscope Size: Mac and 3 Grade View: Grade I Tube type: Oral Tube size: 7.0 mm Number of attempts: 1 Airway Equipment and Method: Stylet Placement Confirmation: ETT inserted through vocal cords under direct vision,  positive ETCO2 and breath sounds checked- equal and bilateral Secured at: 21 cm Tube secured with: Tape Dental Injury: Teeth and Oropharynx as per pre-operative assessment

## 2017-11-09 NOTE — Anesthesia Postprocedure Evaluation (Signed)
Anesthesia Post Note  Patient: Paula [name]  Procedure(s) Performed: HYSTERECTOMY VAGINAL WITH LEFT SALPINGECTOMY (Left Abdomen) UNILATERAL SALPINGECTOMY (Left Vagina )     Patient location during evaluation: PACU Anesthesia Type: General Level of consciousness: awake and alert Pain management: pain level controlled Vital Signs Assessment: post-procedure vital signs reviewed and stable Respiratory status: spontaneous breathing, nonlabored ventilation, respiratory function stable and patient connected to nasal cannula oxygen Cardiovascular status: blood pressure returned to baseline and stable Postop Assessment: no apparent nausea or vomiting Anesthetic complications: no    Last Vitals:  Vitals:   11/09/17 1315 11/09/17 1338  BP:  130/76  Pulse: 92 86  Resp:  14  Temp:  36.6 C  SpO2: 98% 100%    Last Pain:  Vitals:   11/09/17 1245  TempSrc:   PainSc: 6    Pain Goal: Patients Stated Pain Goal: 3 (11/09/17 0819)               [name]

## 2017-11-09 NOTE — Transfer of Care (Signed)
Immediate Anesthesia Transfer of Care Note  Patient: Paula [name]  Procedure(s) Performed: HYSTERECTOMY VAGINAL WITH LEFT SALPINGECTOMY (Left Abdomen) UNILATERAL SALPINGECTOMY (Left Vagina )  Patient Location: PACU  Anesthesia Type:General  Level of Consciousness: awake, alert  and oriented  Airway & Oxygen Therapy: Patient Spontanous Breathing and Patient connected to nasal cannula oxygen  Post-op Assessment: Report given to RN, Post -op Vital signs reviewed and stable and Patient moving all extremities  Post vital signs: Reviewed and stable  Last Vitals:  Vitals Value Taken Time  BP 142/76 11/09/2017 11:45 AM  Temp    Pulse 103 11/09/2017 11:47 AM  Resp 16 11/09/2017 11:47 AM  SpO2 100 % 11/09/2017 11:47 AM  Vitals shown include unvalidated device data.  Last Pain:  Vitals:   11/09/17 0819  TempSrc: Oral  PainSc: 0-No pain      Patients Stated Pain Goal: 3 (36/14/43 1540)  Complications: No apparent anesthesia complications

## 2017-11-09 NOTE — Op Note (Signed)
Paula [name] PROCEDURE DATE: 11/09/2017  PREOPERATIVE DIAGNOSIS:  Abnormal uterine bleeding refractory to medical managerment POSTOPERATIVE DIAGNOSIS:  The same SURGEON:   Verita Schneiders, M.D. ASSISTANT: Clovia Cuff, M.D. OPERATION:  Total Vaginal Hysterectomy, Left Salpingectomy ANESTHESIA:  General endotracheal.  INDICATIONS: The patient is a 52 y.o. [id number] with the aforementioned history here for definitive surgical management. On the preoperative visit, the risks, benefits, indications, and alternatives of the procedure were reviewed with the patient.  On the day of surgery, the risks of surgery were again discussed with the patient including but not limited to: bleeding which may require transfusion or reoperation; infection which may require antibiotics; injury to bowel, bladder, ureters or other surrounding organs; need for additional procedures; thromboembolic phenomenon, incisional problems and other postoperative/anesthesia complications. Written informed consent was obtained.    OPERATIVE FINDINGS: A 7 week size uterus with normal left fallopian tube and ovary. Surgically absent right fallopian tube and ovary.  ESTIMATED BLOOD LOSS: 100 ml FLUIDS:  1000 ml of Lactated Ringers URINE OUTPUT:  600 ml of clear yellow urine. SPECIMENS:  Uterus, cervix and left fallopian tube sent to pathology COMPLICATIONS:  None immediate.  DESCRIPTION OF PROCEDURE:  The patient received intravenous antibiotics and had sequential compression devices applied to her lower extremities while in the preoperative area.  She was then taken to the operating room where general anesthesia was administered and was found to be adequate.  She was placed in the dorsal lithotomy position, and was prepped and draped in a sterile manner.  A Foley catheter was inserted into her bladder and attached to constant drainage. After an adequate timeout was performed, attention was turned to her pelvis.  A weighted speculum  was then placed in the vagina, and the anterior and posterior lips of the cervix were grasped bilaterally with tenaculums.  The cervix was then injected circumferentially with 0.5% Marcaine with epinephrine solution to maintain hemostasis.  The cervix was then circumferentially incised, and the bladder was dissected off the pubocervical fascia anteriorly without complication.  The anterior cul-de-sac was then entered sharply without difficulty and a retractor was placed.  The same procedure was performed posteriorly and the posterior cul-de-sac was entered sharply without difficulty.  A long weighted speculum was inserted into the posterior cul-de-sac.  The Heaney clamp was then used to clamp the uterosacral ligaments on either side.  They were then cut and sutured ligated with 0 Vicryl, and the ligated uterosacral ligaments were transfixed to the ipsilateral vaginal epithelium to further support the vagina and provide hemostasis. Of note, all sutures used in this case were 0 Vicryl unless otherwise noted.   The cardinal ligaments were then clamped, cut and ligated. The uterine vessels and broad ligaments were then serially clamped with the Heaney clamps, cut, and suture ligated on both sides.  Excellent hemostasis was noted at this point.  The uterus was then delivered via the posterior cul-de-sac, and the cornua were clamped with the Heaney clamps, transected, and the uterus was delivered and sent to pathology. These pedicles were then suture ligated to ensure hemostasis.  The left fallopian tube was then clamped, cut, suture ligated and excised.  After completion of the hysterectomy, all pedicles from the uterosacral ligament to the cornua were examined hemostasis was confirmed.  The vaginal cuff was then closed with a in a running locked fashion with care given to incorporate the uterosacral pedicles bilaterally.  All instruments were then removed from the pelvis and a vaginal packing saturated with normal  saline was placed.  The patient tolerated the procedure well.  All instruments, needles, and sponge counts were correct x 2. The patient was taken to the recovery room in stable condition.       Verita Schneiders, MD, [place] for [employer], [place]

## 2017-11-09 NOTE — Anesthesia Preprocedure Evaluation (Addendum)
Anesthesia Evaluation  Patient identified by MRN, date of birth, ID band Patient awake    Reviewed: Allergy & Precautions, H&P , NPO status , Patient's Chart, lab work & pertinent test results, reviewed documented beta blocker date and time   Airway Mallampati: III  TM Distance: >3 FB Neck ROM: Full    Dental  (+) Teeth Intact, Dental Advisory Given, Poor Dentition, Partial Upper,    Pulmonary former smoker,    breath sounds clear to auscultation       Cardiovascular hypertension, + Valvular Problems/Murmurs MR  Rhythm:Regular Rate:Normal + Systolic murmurs ECHO 16 Study Conclusions  - Left ventricle: The cavity size was normal. Wall thickness was   normal. Systolic function was normal. The estimated ejection   fraction was in the range of 55% to 60%. Left ventricular   diastolic function parameters were normal. - Mitral valve: There was mild regurgitation.   Neuro/Psych  Headaches, PSYCHIATRIC DISORDERS    GI/Hepatic Neg liver ROS, PUD, GERD  ,Gastric cancer   Endo/Other    Renal/GU negative Renal ROS     Musculoskeletal negative musculoskeletal ROS (+)   Abdominal   Peds  Hematology  (+) anemia ,   Anesthesia Other Findings   Reproductive/Obstetrics negative OB ROS                           Anesthesia Physical  Anesthesia Plan  ASA: III  Anesthesia Plan: General   Post-op Pain Management:    Induction: Intravenous  PONV Risk Score and Plan: 2 and Treatment may vary due to age or medical condition, Dexamethasone and Ondansetron  Airway Management Planned: Oral ETT  Additional Equipment: None  Intra-op Plan:   Post-operative Plan: Extubation in OR  Informed Consent: I have reviewed the patients History and Physical, chart, labs and discussed the procedure including the risks, benefits and alternatives for the proposed anesthesia with the patient or authorized  representative who has indicated his/her understanding and acceptance.     Plan Discussed with: CRNA, Surgeon and Anesthesiologist  Anesthesia Plan Comments: (  )        Anesthesia Quick Evaluation

## 2017-11-09 NOTE — H&P (Addendum)
Preoperative History and Physical  Paula [name] is a 52 y.o. [id number] here for surgical management of AUB, fibroids.  Has been managed using Depo Provera for years, however patient continued to have significant breakthrough bleeding despite escalating doses and frequency of the injections. She desired definitive surgical management.  Patient was offered endometrial ablation as an alternative, but she declined this given the possible risk of needing hysterectomy; wants definitive management.   No significant preoperative concerns.  Proposed surgery: Laparoscopic assisted vaginal hysterectomy, bilateral salpingectomy  Past Medical History:  Diagnosis Date  . Arthritis    back  . Costochondritis   . Esophageal reflux    On omeprazole  . Family history of cancer   . Headache(784.0) 2011   Migraines  . Heart murmur   . History of hiatal hernia   . History of radiation therapy 05/12/14- 06/17/14   post-op gastric region/ nodes/ 45 Gy in 25 fractions.   . Hypertension    started around 2011  . Iron deficiency anemia due to chronic blood loss 05/25/2015  . 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 On antineoplastic chemotherapy    5-FU and leucovorin  . Ovarian cyst   . Pneumonia   . Pulmonary emboli ([place])   . 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 Tuberculosis    Past Surgical History:  Procedure Laterality Date  . CESAREAN SECTION    . ESOPHAGOGASTRODUODENOSCOPY N/A 09/16/2013   Procedure: ESOPHAGOGASTRODUODENOSCOPY (EGD);  Surgeon: Wonda Horner, MD;  Location: [facility];  Service: Endoscopy;  Laterality: N/A;  . IR CV LINE INJECTION  08/30/2017  . IR FLUORO GUIDE PORT INSERTION RIGHT  03/21/2017  . IR US GUIDE VASC ACCESS RIGHT  03/21/2017  . LAPAROSCOPY N/A 09/20/2013   Procedure: DIAGNOSTIC LAPAROSCOPY,DISTAL GASTRECTOMY AND FEEDING GASTROJEJUNOSTOMY;  Surgeon: [name], MD;  Location: [facility];  Service: General;  Laterality: N/A;  . OOPHORECTOMY     Around 1985 left ovary removal   . PORTACATH PLACEMENT Left 11/18/2013   Procedure: INSERTION PORT-A-CATH;  Surgeon: [name], MD;  Location: [facility];  Service: General;  Laterality: Left;  . TONSILLECTOMY     Around 1994  . TUBAL LIGATION     OB History  Gravida Para Term Preterm AB Living  2 2 1 1   2   SAB TAB Ectopic Multiple Live Births          2    # Outcome Date GA Lbr Len/2nd Weight Sex Delivery Anes PTL Lv  2 Term 06/06/84    M Vag-Spont   LIV  1 Preterm 04/13/82    M CS-LTranv   LIV  Patient denies any other pertinent gynecologic issues.   No current facility-administered medications on file prior to encounter.    Current Outpatient Medications on File Prior to Encounter  Medication Sig Dispense Refill  . carvedilol (COREG) 6.25 MG tablet Take 6.25 mg by mouth 2 (two) times daily.     . diclofenac sodium (VOLTAREN) 1 % GEL Apply 2-4 g topically 2 (two) times daily as needed for pain.  0  . lidocaine-prilocaine (EMLA) cream Apply 1 application topically as needed. (Patient taking differently: Apply 1 application topically daily as needed (prior to port being accessed.). ) 30 g 6  . Multiple Vitamins-Minerals (MULTIVITAMIN & MINERAL PO) Take 1 tablet by mouth daily.    Marland Kitchen omeprazole (PRILOSEC) 20 MG capsule Take 20 mg by mouth 2 (two) times daily.   5  . ondansetron (ZOFRAN) 4 MG tablet Take  2 tablets (8 mg total) by mouth 2 (two) times daily. 60 tablet 3  . polyethylene glycol powder (GLYCOLAX/MIRALAX) powder Take 17 g by mouth daily as needed (constipation.).   1  . promethazine (PHENERGAN) 12.5 MG tablet Take 12.5 mg by mouth every 6 (six) hours as needed for nausea or vomiting.     Allergies  Allergen Reactions  . Tape Itching    Ok with adhesive tape, ok with paper tape per patient    Social History:   reports that she quit smoking about 19 years ago. She has a 16.00 pack-year smoking history. She has never used smokeless tobacco. She reports that she does not drink alcohol or use drugs.  Family  History  Problem Relation Age of Onset  . Hypertension Mother   . Diabetes Mellitus II Mother   . Other Mother        hx of hysterectomy for unspecified reason  . Hypertension Sister   . Hypertension Brother   . Heart failure Maternal Grandmother   . Heart Problems Maternal Grandmother   . Hypertension Maternal Grandfather   . Diabetes Maternal Grandfather   . Colon cancer Paternal Uncle        dx. late 50s-early 60s  . Gastric cancer Cousin 15  . Colon cancer Cousin        dx. early-late 62s; (father also had colon cancer)  . Colon cancer Paternal Uncle        dx. late 50s-early 60s  . Lung cancer Paternal Uncle        dx. 70s; smoker  . Colon polyps Paternal Uncle        unknown number  . Heart attack Maternal Aunt   . Heart Problems Paternal Aunt   . Heart attack Paternal Grandfather   . Colon cancer Paternal Grandfather        dx. 50s-60s  . Colon polyps Brother        1 maternal half-brother had approx 3 polyps  . Breast cancer Cousin 53  . Breast cancer Cousin        dx. early 70s or earlier (father had colon cancer)  . Lung cancer Other        PGF's brothers (x4); dx. later in life; lim info    Review of Systems: Pertinent items noted in HPI and remainder of comprehensive ROS otherwise negative.  PHYSICAL EXAM: Blood pressure 122/86, pulse 67, temperature 97.9 F (36.6 C), temperature source Oral, resp. rate 16, SpO2 100 %. CONSTITUTIONAL: Well-developed, well-nourished female in no acute distress.  HENT:  Normocephalic, atraumatic, External right and left ear normal. Oropharynx is clear and moist EYES: Conjunctivae and EOM are normal. Pupils are equal, round, and reactive to light. No scleral icterus.  NECK: Normal range of motion, supple, no masses SKIN: Skin is warm and dry. No rash noted. Not diaphoretic. No erythema. No pallor. NEUROLOGIC: Alert and oriented to person, place, and time. Normal reflexes, muscle tone coordination. No cranial nerve deficit  noted. PSYCHIATRIC: Normal mood and affect. Normal behavior. Normal judgment and thought content. CARDIOVASCULAR: Normal heart rate noted, regular rhythm RESPIRATORY: Effort and breath sounds normal, no problems with respiration noted ABDOMEN: Soft, nontender, nondistended. PELVIC: Deferred MUSCULOSKELETAL: Normal range of motion. No edema and no tenderness. 2+ distal pulses.  Labs: Results for orders placed or performed during the hospital encounter of 11/09/17 (from the past 336 hour(s))  Pregnancy, urine   Collection Time: 11/09/17  8:05 AM  Result Value Ref Range  Preg Test, Ur NEGATIVE NEGATIVE  Results for orders placed or performed during the hospital encounter of 10/27/17 (from the past 336 hour(s))  CBC   Collection Time: 10/27/17 11:10 AM  Result Value Ref Range   WBC 4.5 4.0 - 10.5 K/uL   RBC 4.07 3.87 - 5.11 MIL/uL   Hemoglobin 11.8 (L) 12.0 - 15.0 g/dL   HCT 36.4 36.0 - 46.0 %   MCV 89.4 78.0 - 100.0 fL   MCH 29.0 26.0 - 34.0 pg   MCHC 32.4 30.0 - 36.0 g/dL   RDW 14.5 11.5 - 15.5 %   Platelets 228 150 - 400 K/uL  Comprehensive metabolic panel   Collection Time: 10/27/17 11:10 AM  Result Value Ref Range   Sodium 140 135 - 145 mmol/L   Potassium 3.7 3.5 - 5.1 mmol/L   Chloride 106 98 - 111 mmol/L   CO2 22 22 - 32 mmol/L   Glucose, Bld 81 70 - 99 mg/dL   BUN 10 6 - 20 mg/dL   Creatinine, Ser 0.95 0.44 - 1.00 mg/dL   Calcium 8.7 (L) 8.9 - 10.3 mg/dL   Total Protein 7.2 6.5 - 8.1 g/dL   Albumin 3.8 3.5 - 5.0 g/dL   AST 17 15 - 41 U/L   ALT 14 0 - 44 U/L   Alkaline Phosphatase 65 38 - 126 U/L   Total Bilirubin 0.7 0.3 - 1.2 mg/dL   GFR calc non Af Amer >60 >60 mL/min   GFR calc Af Amer >60 >60 mL/min   Anion gap 12 5 - 15  Type and screen [place]   Collection Time: 10/27/17 11:10 AM  Result Value Ref Range   ABO/RH(D) B POS    Antibody Screen NEG    Sample Expiration      10/30/2017 Performed at [facility], [address], [place], [place] [address]   ABO/Rh   Collection Time: 10/27/17 11:10 AM  Result Value Ref Range   ABO/RH(D)      B POS Performed at [facility], [address], [place], [place] [address]     Imaging Studies: 05/03/2017  TRANSABDOMINAL AND TRANSVAGINAL ULTRASOUND OF PELVIS CLINICAL DATA:  Abnormal uterine bleeding for 2 weeks. COMPARISON:  None FINDINGS: Uterus Measurements: 7.0 x 4.5 x 5.4 cm. Multiple small fibroids, the largest a right fundal fibroid measuring 2.7 cm. EndometriumThickness: 2 mm in thickness.  No focal abnormality visualized. Right ovary Measurements: Not visualized.  No adnexal mass seen. Left ovary Measurements: 2.5 x 2.0 x 2.0 cm. Normal appearance/no adnexal mass. Other findings No abnormal free fluid. IMPRESSION: Uterine fibroids, the largest 2.7 cm. No acute findings in the pelvis.  Electronically Signed   By: Rolm Baptise M.D.   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  She declines all other medical and surgical alternatives. The risks of surgery were discussed in detail with the patient including but not limited to: bleeding which may require transfusion or reoperation; infection which may require antibiotics; injury to surrounding organs which may involve bowel, bladder, ureters ; need for additional procedures including laparotomy; thromboembolic phenomenon (will get anticoagulation after surgery given history and high risk  for VTE), surgical site problems and other postoperative/anesthesia complications. Likelihood of success in alleviating the patient's condition was discussed.  Routine postoperative instructions will be reviewed with the patient and her family in detail after surgery.  The patient concurred with the proposed plan, giving informed written consent for the surgery.  Patient has been NPO since last night and she will remain NPO for procedure.  Anesthesia and OR aware.  Preoperative prophylactic antibiotics and SCDs ordered on call to the OR.  To OR when ready.    Verita Schneiders, MD, [place] for [employer], [place]

## 2017-11-10 ENCOUNTER — Encounter (HOSPITAL_COMMUNITY): Payer: Self-pay | Admitting: Obstetrics & Gynecology

## 2017-11-10 DIAGNOSIS — N939 Abnormal uterine and vaginal bleeding, unspecified: Secondary | ICD-10-CM | POA: Diagnosis not present

## 2017-11-10 LAB — CBC
HEMATOCRIT: 31.2 % — AB (ref 36.0–46.0)
Hemoglobin: 10.4 g/dL — ABNORMAL LOW (ref 12.0–15.0)
MCH: 29.5 pg (ref 26.0–34.0)
MCHC: 33.3 g/dL (ref 30.0–36.0)
MCV: 88.6 fL (ref 78.0–100.0)
PLATELETS: 239 10*3/uL (ref 150–400)
RBC: 3.52 MIL/uL — ABNORMAL LOW (ref 3.87–5.11)
RDW: 14.1 % (ref 11.5–15.5)
WBC: 10.8 10*3/uL — ABNORMAL HIGH (ref 4.0–10.5)

## 2017-11-10 LAB — COMPREHENSIVE METABOLIC PANEL
ALT: 14 U/L (ref 0–44)
AST: 15 U/L (ref 15–41)
Albumin: 3.3 g/dL — ABNORMAL LOW (ref 3.5–5.0)
Alkaline Phosphatase: 58 U/L (ref 38–126)
Anion gap: 7 (ref 5–15)
BILIRUBIN TOTAL: 0.8 mg/dL (ref 0.3–1.2)
BUN: 8 mg/dL (ref 6–20)
CO2: 27 mmol/L (ref 22–32)
Calcium: 8.3 mg/dL — ABNORMAL LOW (ref 8.9–10.3)
Chloride: 102 mmol/L (ref 98–111)
Creatinine, Ser: 0.8 mg/dL (ref 0.44–1.00)
Glucose, Bld: 126 mg/dL — ABNORMAL HIGH (ref 70–99)
POTASSIUM: 4.5 mmol/L (ref 3.5–5.1)
Sodium: 136 mmol/L (ref 135–145)
Total Protein: 6.1 g/dL — ABNORMAL LOW (ref 6.5–8.1)

## 2017-11-10 MED ORDER — ACETAMINOPHEN 500 MG PO TABS
1000.0000 mg | ORAL_TABLET | Freq: Once | ORAL | Status: AC
  Administered 2017-11-10: 1000 mg via ORAL
  Filled 2017-11-10: qty 2

## 2017-11-10 MED ORDER — ACETAMINOPHEN 500 MG PO TABS: 1000.0000 mg | ORAL_TABLET | Freq: Four times a day (QID) | ORAL | 2 refills | Status: DC | PRN

## 2017-11-10 MED ORDER — HYDROMORPHONE HCL 2 MG PO TABS
2.0000 mg | ORAL_TABLET | Freq: Once | ORAL | Status: AC
  Administered 2017-11-10: 2 mg via ORAL
  Filled 2017-11-10: qty 1

## 2017-11-10 MED ORDER — SODIUM CHLORIDE 0.9 % IV SOLN: 250.0000 mL | INTRAVENOUS | Status: DC | PRN

## 2017-11-10 MED ORDER — RIVAROXABAN 10 MG PO TABS: 10.0000 mg | ORAL_TABLET | Freq: Every day | ORAL | 0 refills | Status: DC

## 2017-11-10 MED ORDER — SODIUM CHLORIDE 0.9% FLUSH: 3.0000 mL | Freq: Two times a day (BID) | INTRAVENOUS | Status: DC

## 2017-11-10 MED ORDER — HYDROMORPHONE HCL 2 MG PO TABS: 2.0000 mg | ORAL_TABLET | ORAL | 0 refills | Status: DC | PRN

## 2017-11-10 MED ORDER — HEPARIN SOD (PORK) LOCK FLUSH 100 UNIT/ML IV SOLN
500.0000 [IU] | INTRAVENOUS | Status: AC | PRN
  Administered 2017-11-10: 500 [IU]

## 2017-11-10 MED ORDER — DOCUSATE SODIUM 100 MG PO CAPS: 100.0000 mg | ORAL_CAPSULE | Freq: Two times a day (BID) | ORAL | 2 refills | Status: DC | PRN

## 2017-11-10 MED ORDER — SODIUM CHLORIDE 0.9% FLUSH: 3.0000 mL | INTRAVENOUS | Status: DC | PRN

## 2017-11-10 NOTE — Anesthesia Postprocedure Evaluation (Signed)
Anesthesia Post Note  Patient: Paula [name]  Procedure(s) Performed: HYSTERECTOMY VAGINAL WITH LEFT SALPINGECTOMY (Left Abdomen) UNILATERAL SALPINGECTOMY (Left Vagina )     Patient location during evaluation: [facility] Anesthesia Type: General Level of consciousness: awake and alert Pain management: satisfactory to patient Vital Signs Assessment: post-procedure vital signs reviewed and stable Respiratory status: spontaneous breathing and respiratory function stable Cardiovascular status: stable Postop Assessment: adequate PO intake Anesthetic complications: no    Last Vitals:  Vitals:   11/10/17 0413 11/10/17 0759  BP: 108/84 100/74  Pulse: (!) 58 62  Resp: 17 18  Temp: (!) 36.4 C 36.6 C  SpO2: 100% 97%    Last Pain:  Vitals:   11/10/17 0843  TempSrc:   PainSc: 6    Pain Goal: Patients Stated Pain Goal: 3 (11/10/17 0800)               Katherina Mires

## 2017-11-10 NOTE — Discharge Summary (Signed)
Gynecology Physician Postoperative Discharge Summary  Patient ID: Paula [name] MRN: [id number] DOB/AGE: 1965-12-28 52 y.o.  Admit Date: 11/09/2017 Discharge Date: 11/10/2017  Preoperative Diagnoses: Abnormal uterine bleeding  Procedures: HYSTERECTOMY VAGINAL WITH LEFT SALPINGECTOMY   Hospital Course:  Paula [name] is a 52 y.o. [id number]  admitted for scheduled surgery.  She underwent the procedures as mentioned above, her operation was uncomplicated. For further details about surgery, please refer to the operative report. Patient had an uncomplicated postoperative course. By time of discharge on POD#1, her pain was controlled on oral pain medications; she was ambulating, voiding without difficulty, tolerating regular diet and passing flatus. Patient received Lovenox on POD#1 given her history of PE (in the setting of gastric cancer); she was sent home with a prescription for prophylactic Xarelto for VTE prophylaxis.  She was deemed stable for discharge to home.   Significant Labs: CBC Latest Ref Rng & Units 11/10/2017 10/27/2017 10/03/2017  WBC 4.0 - 10.5 K/uL 10.8(H) 4.5 5.4  Hemoglobin 12.0 - 15.0 g/dL 10.4(L) 11.8(L) 11.1(L)  Hematocrit 36.0 - 46.0 % 31.2(L) 36.4 34.8  Platelets 150 - 400 K/uL 239 228 270    Discharge Exam: Blood pressure 93/64, pulse (!) 58, temperature (!) 97.5 F (36.4 C), temperature source Oral, resp. rate 18, SpO2 100 %. General appearance: alert and no distress  Resp: clear to auscultation bilaterally  Cardio: regular rate and rhythm  GI: soft, non-tender; bowel sounds normal; no masses, no organomegaly.  Pelvic: scant blood on pad  Extremities: extremities normal, atraumatic, no cyanosis or edema and Homans sign is negative, no sign of DVT  Discharged Condition: Stable   Discharge disposition: 01-Home or Self Care   Allergies as of 11/10/2017      Reactions   Tape Itching   Ok with adhesive tape, ok with paper tape per patient       Medication List    STOP taking these medications   HYDROcodone-acetaminophen 5-325 MG tablet Commonly known as:  NORCO/VICODIN     TAKE these medications   acetaminophen 500 MG tablet Commonly known as:  TYLENOL Take 2 tablets (1,000 mg total) by mouth every 6 (six) hours as needed for mild pain or moderate pain.   carvedilol 6.25 MG tablet Commonly known as:  COREG Take 6.25 mg by mouth 2 (two) times daily.   diclofenac sodium 1 % Gel Commonly known as:  VOLTAREN Apply 2-4 g topically 2 (two) times daily as needed for pain.   docusate sodium 100 MG capsule Commonly known as:  COLACE Take 1 capsule (100 mg total) by mouth 2 (two) times daily as needed for mild constipation or moderate constipation.   HYDROmorphone 2 MG tablet Commonly known as:  DILAUDID Take 1 tablet (2 mg total) by mouth every 4 (four) hours as needed for severe pain.   lidocaine-prilocaine cream Commonly known as:  EMLA Apply 1 application topically as needed. What changed:    when to take this  reasons to take this   rivaroxaban 10 MG Tabs tablet Commonly known as:  XARELTO Take 1 tablet (10 mg total) by mouth daily.      Future Appointments  Date Time Provider [place]  11/24/2017 10:15 AM Herberth Deharo, Sallyanne Havers, MD [facility]  12/15/2017 11:35 AM Anshu Wehner, Sallyanne Havers, MD [facility]  01/03/2018 11:45 AM [facility] None  01/03/2018 12:00 PM [facility] None  08/15/2018 11:00 AM Eppie Gibson, MD [facility] None     Signed:  Miesha Bachmann  Harolyn Rutherford, MD, [place] Attending [place], [facility]

## 2017-11-10 NOTE — Plan of Care (Signed)
  Problem: Pain Managment: Goal: General experience of comfort will improve Outcome: Progressing   Problem: Safety: Goal: Ability to remain free from injury will improve Outcome: Progressing   Problem: Education: Goal: Knowledge of the prescribed therapeutic regimen will improve Outcome: Progressing

## 2017-11-10 NOTE — Care Management Obs Status (Signed)
[place] NOTIFICATION   Patient Details  Name: Paula [name] MRN: [id number] Date of Birth: 13-Jan-1966   Medicare Observation Status Notification Given:  Yes    Yong Channel, RN 11/10/2017, 9:52 AM

## 2017-11-10 NOTE — Care Management CC44 (Signed)
Condition Code 44 Documentation Completed  Patient Details  Name: Paula [name] MRN: [id number] Date of Birth: June 03, 1965   Condition Code 44 given:  Yes Patient signature on Condition Code 44 notice:  Yes Documentation of 2 MD's agreement:  Yes Code 44 added to claim:  Yes    Yong Channel, RN 11/10/2017, 10:07 AM
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
## 2017-11-10 NOTE — Discharge Instructions (Signed)
Vaginal Hysterectomy, Care After °Refer to this sheet in the next few weeks. These instructions provide you with information about caring for yourself after your procedure. Your health care provider may also give you more specific instructions. Your treatment has been planned according to current medical practices, but problems sometimes occur. Call your health care provider if you have any problems or questions after your procedure. °What can I expect after the procedure? °After the procedure, it is common to have: °· Pain. °· Soreness and numbness in your incision areas. °· Vaginal bleeding and discharge. °· Constipation. °· Temporary problems emptying the bladder. °· Feelings of sadness or other emotions. ° °Follow these instructions at home: °Medicines °· Take over-the-counter and prescription medicines only as told by your health care provider. °· If you were prescribed an antibiotic medicine, take it as told by your health care provider. Do not stop taking the antibiotic even if you start to feel better. °· Do not drive or operate heavy machinery while taking prescription pain medicine. °Activity °· Return to your normal activities as told by your health care provider. Ask your health care provider what activities are safe for you. °· Get regular exercise as told by your health care provider. You may be told to take short walks every day and go farther each time. °· Do not lift anything that is heavier than 10 lb (4.5 kg). °General instructions ° °· Do not put anything in your vagina for 6 weeks after your surgery or as told by your health care provider. This includes tampons and douches. °· Do not have sex until your health care provider says you can. °· Do not take baths, swim, or use a hot tub until your health care provider approves. °· Drink enough fluid to keep your urine clear or pale yellow. °· Do not drive for 24 hours if you were given a sedative. °· Keep all follow-up visits as told by your health  care provider. This is important. °Contact a health care provider if: °· Your pain medicine is not helping. °· You have a fever. °· You have redness, swelling, or pain at your incision site. °· You have blood, pus, or a bad-smelling discharge from your vagina. °· You continue to have difficulty urinating. °Get help right away if: °· You have severe abdominal or back pain. °· You have heavy bleeding from your vagina. °· You have chest pain or shortness of breath. °This information is not intended to replace advice given to you by your health care provider. Make sure you discuss any questions you have with your health care provider. °Document Released: 06/01/2015 Document Revised: 07/16/2015 Document Reviewed: 02/22/2015 °Elsevier Interactive Patient Education © 2018 [employer] Inc[employer] ° °

## 2017-11-10 NOTE — Addendum Note (Signed)
Addendum  created 11/10/17 0858 by Flossie Dibble, CRNA   Sign clinical note

## 2017-11-11 NOTE — Progress Notes (Signed)
11/09/2017 Surgical Pathology Diagnosis Uterus and cervix, and left fallopian tube - UTERUS: -ENDOMETRIUM: CYSTIC ATROPHY WITH PROGESTIN EFFECT. NO HYPERPLASIA OR MALIGNANCY. -MYOMETRIUM: LEIOMYOMATA. ADENOMYOSIS. NO MALIGNANCY. -SEROSA: UNREMARKABLE. NO MALIGNANCY. - CERVIX: BENIGN ENDOCERVICAL TYPE POLYP. NO DYSPLASIA OR MALIGNANCY. - LEFT FALLOPIAN TUBE: PARATUBAL CYST. NO MALIGNANCY. Patient was called and given results over the phone.  She is doing well after being discharged to home yesterday. Will follow up as scheduled.  Verita Schneiders, MD

## 2017-11-14 ENCOUNTER — Other Ambulatory Visit: Payer: Self-pay | Admitting: *Deleted

## 2017-11-14 DIAGNOSIS — C169 Malignant neoplasm of stomach, unspecified: Secondary | ICD-10-CM

## 2017-11-14 MED ORDER — HYDROCODONE-ACETAMINOPHEN 5-325 MG PO TABS: 1.0000 | ORAL_TABLET | Freq: Four times a day (QID) | ORAL | 0 refills | Status: DC | PRN

## 2017-11-17 ENCOUNTER — Other Ambulatory Visit: Payer: Self-pay | Admitting: *Deleted

## 2017-11-17 ENCOUNTER — Inpatient Hospital Stay: Payer: Medicare Other

## 2017-11-17 ENCOUNTER — Inpatient Hospital Stay: Payer: Medicare Other | Attending: Hematology & Oncology

## 2017-11-17 VITALS — BP 126/76 | HR 83 | Temp 98.2°F | Resp 20

## 2017-11-17 DIAGNOSIS — Z85028 Personal history of other malignant neoplasm of stomach: Secondary | ICD-10-CM | POA: Insufficient documentation

## 2017-11-17 DIAGNOSIS — D509 Iron deficiency anemia, unspecified: Secondary | ICD-10-CM | POA: Insufficient documentation

## 2017-11-17 DIAGNOSIS — Z95828 Presence of other vascular implants and grafts: Secondary | ICD-10-CM

## 2017-11-17 DIAGNOSIS — D500 Iron deficiency anemia secondary to blood loss (chronic): Secondary | ICD-10-CM

## 2017-11-17 LAB — CBC WITH DIFFERENTIAL (CANCER CENTER ONLY)
Basophils Absolute: 0 K/uL (ref 0.0–0.1)
Basophils Relative: 0 %
Eosinophils Absolute: 0.1 K/uL (ref 0.0–0.5)
Eosinophils Relative: 2 %
HCT: 35.4 % (ref 34.8–46.6)
Hemoglobin: 11.4 g/dL — ABNORMAL LOW (ref 11.6–15.9)
Lymphocytes Relative: 24 %
Lymphs Abs: 1.5 K/uL (ref 0.9–3.3)
MCH: 28.9 pg (ref 26.0–34.0)
MCHC: 32.2 g/dL (ref 32.0–36.0)
MCV: 89.6 fL (ref 81.0–101.0)
Monocytes Absolute: 0.6 K/uL (ref 0.1–0.9)
Monocytes Relative: 10 %
Neutro Abs: 3.8 K/uL (ref 1.5–6.5)
Neutrophils Relative %: 64 %
Platelet Count: 269 K/uL (ref 145–400)
RBC: 3.95 MIL/uL (ref 3.70–5.32)
RDW: 14 % (ref 11.1–15.7)
WBC Count: 6 K/uL (ref 3.9–10.0)

## 2017-11-17 MED ORDER — SODIUM CHLORIDE 0.9% FLUSH
10.0000 mL | INTRAVENOUS | Status: DC | PRN
  Administered 2017-11-17: 10 mL via INTRAVENOUS
  Filled 2017-11-17: qty 10

## 2017-11-17 MED ORDER — HEPARIN SOD (PORK) LOCK FLUSH 100 UNIT/ML IV SOLN
500.0000 [IU] | Freq: Once | INTRAVENOUS | Status: AC
  Administered 2017-11-17: 500 [IU] via INTRAVENOUS
  Filled 2017-11-17: qty 5

## 2017-11-20 LAB — FERRITIN: Ferritin: 347 ng/mL — ABNORMAL HIGH (ref 11–307)

## 2017-11-20 LAB — IRON AND TIBC
Iron: 71 ug/dL (ref 41–142)
SATURATION RATIOS: 33 % (ref 21–57)
TIBC: 215 ug/dL — AB (ref 236–444)
UIBC: 144 ug/dL

## 2017-11-24 ENCOUNTER — Ambulatory Visit (INDEPENDENT_AMBULATORY_CARE_PROVIDER_SITE_OTHER): Payer: Medicare Other | Admitting: Obstetrics & Gynecology

## 2017-11-24 DIAGNOSIS — Z90710 Acquired absence of both cervix and uterus: Secondary | ICD-10-CM

## 2017-11-24 DIAGNOSIS — Z9889 Other specified postprocedural states: Secondary | ICD-10-CM

## 2017-11-24 DIAGNOSIS — R399 Unspecified symptoms and signs involving the genitourinary system: Secondary | ICD-10-CM

## 2017-11-24 DIAGNOSIS — Z09 Encounter for follow-up examination after completed treatment for conditions other than malignant neoplasm: Secondary | ICD-10-CM

## 2017-11-24 LAB — POCT URINALYSIS DIP (DEVICE)
Bilirubin Urine: NEGATIVE
Glucose, UA: NEGATIVE mg/dL
Ketones, ur: NEGATIVE mg/dL
NITRITE: NEGATIVE
Protein, ur: NEGATIVE mg/dL
SPECIFIC GRAVITY, URINE: 1.02 (ref 1.005–1.030)
Urobilinogen, UA: 0.2 mg/dL (ref 0.0–1.0)
pH: 6.5 (ref 5.0–8.0)

## 2017-11-24 NOTE — Progress Notes (Signed)
Subjective:     Paula [name] is a 52 y.o. female who presents to the clinic 2 weeks status post vaginal hysterectomy for abnormal uterine bleeding. Eating a regular diet without difficulty. Bowel movements are abnormal with mild constipation. Pain is controlled with current analgesics. Medications being used: acetaminophen.  Reports increased pelvic pressure during urination, no fevers, no back pain.   The following portions of the patient's history were reviewed and updated as appropriate: allergies, current medications, past family history, past medical history, past social history, past surgical history and problem list.  Review of Systems Pertinent items noted in HPI and remainder of comprehensive ROS otherwise negative.    Objective:   AFVSS General:  alert and no distress  Abdomen: soft, bowel sounds active, non-tender  Pelvic:   deferred    11/09/2017 Surgical Pathology Diagnosis Uterus and cervix, and left fallopian tube - UTERUS: -ENDOMETRIUM: CYSTIC ATROPHY WITH PROGESTIN EFFECT. NO HYPERPLASIA OR MALIGNANCY. -MYOMETRIUM: LEIOMYOMATA. ADENOMYOSIS. NO MALIGNANCY. -SEROSA: UNREMARKABLE. NO MALIGNANCY. - CERVIX: BENIGN ENDOCERVICAL TYPE POLYP. NO DYSPLASIA OR MALIGNANCY. - LEFT FALLOPIAN TUBE: PARATUBAL CYST. NO MALIGNANCY. Assessment:    Doing well postoperatively. Operative findings again reviewed. Pathology report discussed.    Plan:   1. Continue any current medications. 2. Will check for UTI and manage accordingly 3. Activity restrictions: no lifting more than 15 pounds 4. Anticipated return to work: not applicable. 5. Follow up with exam as scheduled   Verita Schneiders, MD, [place], [facility] for [employer], [place]

## 2017-11-26 LAB — URINE CULTURE

## 2017-12-01 ENCOUNTER — Telehealth: Payer: Self-pay | Admitting: Obstetrics & Gynecology

## 2017-12-01 NOTE — Telephone Encounter (Signed)
Patient want to be released to go back to work

## 2017-12-04 NOTE — Telephone Encounter (Signed)
She can return to work

## 2017-12-05 ENCOUNTER — Telehealth: Payer: Self-pay | Admitting: *Deleted

## 2017-12-05 ENCOUNTER — Other Ambulatory Visit: Payer: Self-pay | Admitting: Obstetrics & Gynecology

## 2017-12-05 NOTE — Telephone Encounter (Signed)
Paula [name] called asking for results of her urine culture- which were negative and I reviewed with her.  Asked why no one called. I explained we call for abnormal results. She voices understanding.

## 2017-12-09 ENCOUNTER — Emergency Department (HOSPITAL_BASED_OUTPATIENT_CLINIC_OR_DEPARTMENT_OTHER)
Admission: EM | Admit: 2017-12-09 | Discharge: 2017-12-09 | Disposition: A | Discharge status: Home / Self Care | Payer: Medicare Other | Attending: Emergency Medicine | Admitting: Emergency Medicine

## 2017-12-09 ENCOUNTER — Other Ambulatory Visit: Payer: Self-pay

## 2017-12-09 ENCOUNTER — Encounter (HOSPITAL_BASED_OUTPATIENT_CLINIC_OR_DEPARTMENT_OTHER): Payer: Self-pay | Admitting: *Deleted

## 2017-12-09 DIAGNOSIS — Z79899 Other long term (current) drug therapy: Secondary | ICD-10-CM | POA: Diagnosis not present

## 2017-12-09 DIAGNOSIS — H66001 Acute suppurative otitis media without spontaneous rupture of ear drum, right ear: Secondary | ICD-10-CM | POA: Diagnosis not present

## 2017-12-09 DIAGNOSIS — Z87891 Personal history of nicotine dependence: Secondary | ICD-10-CM | POA: Insufficient documentation

## 2017-12-09 DIAGNOSIS — Z85028 Personal history of other malignant neoplasm of stomach: Secondary | ICD-10-CM | POA: Insufficient documentation

## 2017-12-09 DIAGNOSIS — I10 Essential (primary) hypertension: Secondary | ICD-10-CM | POA: Diagnosis not present

## 2017-12-09 DIAGNOSIS — Z7901 Long term (current) use of anticoagulants: Secondary | ICD-10-CM | POA: Insufficient documentation

## 2017-12-09 DIAGNOSIS — H9201 Otalgia, right ear: Secondary | ICD-10-CM | POA: Diagnosis present

## 2017-12-09 MED ORDER — AMOXICILLIN 500 MG PO CAPS: 500.0000 mg | ORAL_CAPSULE | Freq: Three times a day (TID) | ORAL | 0 refills | Status: DC

## 2017-12-09 NOTE — Discharge Instructions (Signed)
You were seen in the ER for right ear pain.   Part of your tympanic membrane (ear drum) is red and this is likely from an early ear infection.   Take 600 mg of ibuprofen and/or 500 to 1000 mg of acetaminophen every 6-8 hours for pain.  Take antibiotics as prescribed.  Avoid soaking in the top or getting water in your ear.  Return to the ER for worsening pain, swelling, redness or pain to the area behind your right ear, neck stiffness, fevers, chills, ear bleeding, itching or odor

## 2017-12-09 NOTE — ED Notes (Signed)
Pt/family verbalized understanding of discharge instructions.   

## 2017-12-09 NOTE — ED Provider Notes (Signed)
[facility]PART[facility] Provider Note   CSN: [id number] Arrival date & time: 12/09/17  1637     History   Chief Complaint Chief Complaint  Patient presents with  . Otalgia    HPI Paula [name] is a 52 y.o. female is here for evaluation of right-sided ear pain, moderate.  Onset this morning around 3 AM.  Describes the pain as sharp, shoots from her right ear down to her right lateral neck and posterior scalp.  She put warm sweet oil inside her ear and now the pain is worse.  Pain is worse with palpation over this area.  1 to 2 weeks ago she went to urgent care for nasal congestion, she has been using nasal sprays.  Congestion has improved.  She denies fevers, chills, ear swelling or drainage or itching, changes in hearing, tinnitus, recent swimming, airplane travel, vertiginous symptoms.  No alleviating factors.  HPI  Past Medical History:  Diagnosis Date  . Arthritis    back  . Costochondritis   . Esophageal reflux    On omeprazole  . Family history of cancer   . Headache(784.0) 2011   Migraines  . Heart murmur    never caused any problems  . History of hiatal hernia   . History of radiation therapy 05/12/14- 06/17/14   post-op gastric region/ nodes/ 45 Gy in 25 fractions.   . Hypertension    started around 2011  . Iron deficiency anemia due to chronic blood loss 05/25/2015  . 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 On antineoplastic chemotherapy    5-FU and leucovorin  . Ovarian cyst   . Pneumonia    x 1  . Pulmonary emboli ([place]) 2015  . 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 Tuberculosis    ? [id number]  . Wears partial dentures    upper dentures    Patient Active Problem List   Diagnosis Date Noted  . S/P vaginal hysterectomy 11/09/2017  . Left knee pain 04/25/2017  . Iron deficiency anemia due to chronic blood loss 05/25/2015  . Healed or old pulmonary embolism 03/31/2015  . Genetic testing 01/23/2015  . Insomnia 06/30/2014  .  Neutropenia ([place]) 02/24/2014  . Neuropathy due to chemotherapeutic drug ([place]) 12/18/2013  . Hypoalbuminemia 12/18/2013  . Malfunction of device 12/18/2013  . Leg edema 12/18/2013  . Neoplasm related pain 12/18/2013  . Other pancytopenia ([place]) 12/13/2013  . Hyponatremia 12/12/2013  . Hypokalemia 12/12/2013  . Weight loss 12/04/2013  . Abnormal loss of weight 12/04/2013  . Family history of cancer   . 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 Palliative care encounter 11/04/2013  . Acute kidney injury ([place]) 11/03/2013  . Chronic low back pain 10/31/2013  . 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 Constipation, chronic 10/28/2013  . Protein-calorie malnutrition, severe ([place]) 10/23/2013  . Intractable nausea and vomiting 10/22/2013  . Intractable vomiting 10/22/2013  . 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 Carcinoma of stomach ([place]) 09/18/2013  . Antral ulcer 09/16/2013  . Migraine headache 09/14/2013  . PTSD (post-traumatic stress disorder) 08/19/2013  . Left knee injury 07/30/2013  . Mitral regurgitation 02/15/2013  . Chest pain 01/23/2012  . Essential hypertension 01/23/2012  . Murmur 01/23/2012    Past Surgical History:  Procedure Laterality Date  . CESAREAN SECTION     x 2  . ESOPHAGOGASTRODUODENOSCOPY N/A 09/16/2013   Procedure:  ESOPHAGOGASTRODUODENOSCOPY (EGD);  Surgeon: Wonda Horner, MD;  Location: [facility];  Service: Endoscopy;  Laterality: N/A;  . IR CV LINE INJECTION  08/30/2017  . IR FLUORO GUIDE PORT INSERTION RIGHT  03/21/2017  . IR US GUIDE VASC ACCESS RIGHT  03/21/2017  . LAPAROSCOPY N/A 09/20/2013   Procedure: DIAGNOSTIC LAPAROSCOPY,DISTAL GASTRECTOMY AND FEEDING GASTROJEJUNOSTOMY;  Surgeon: [name], MD;  Location: [facility];  Service: General;  Laterality: N/A;  . OOPHORECTOMY     Around 1985 left ovary removal  . PORTACATH  PLACEMENT Left 11/18/2013   Procedure: INSERTION PORT-A-CATH;  Surgeon: [name], MD;  Location: [facility];  Service: General;  Laterality: Left;  . TONSILLECTOMY     Around 1994  . TUBAL LIGATION    . UNILATERAL SALPINGECTOMY Left 11/09/2017   Procedure: UNILATERAL SALPINGECTOMY;  Surgeon: Osborne Oman, MD;  Location: [facility];  Service: Gynecology;  Laterality: Left;  Marland Kitchen VAGINAL HYSTERECTOMY Left 11/09/2017   Procedure: HYSTERECTOMY VAGINAL WITH LEFT SALPINGECTOMY;  Surgeon: Osborne Oman, MD;  Location: [facility];  Service: Gynecology;  Laterality: Left;     OB History    Gravida  2   Para  2   Term  1   Preterm  1   AB      Living  2     SAB      TAB      Ectopic      Multiple      Live Births  2            Home Medications    Prior to Admission medications   Medication Sig Start Date End Date Taking? Authorizing Provider  acetaminophen (TYLENOL) 500 MG tablet Take 2 tablets (1,000 mg total) by mouth every 6 (six) hours as needed for mild pain or moderate pain. 11/10/17  Yes Anyanwu, Sallyanne Havers, MD  carvedilol (COREG) 6.25 MG tablet Take 6.25 mg by mouth 2 (two) times daily.    Yes [provider]  diclofenac sodium (VOLTAREN) 1 % GEL Apply 2-4 g topically 2 (two) times daily as needed for pain. 10/13/17  Yes [provider]  docusate sodium (COLACE) 100 MG capsule Take 1 capsule (100 mg total) by mouth 2 (two) times daily as needed for mild constipation or moderate constipation. 11/10/17  Yes Anyanwu, Sallyanne Havers, MD  HYDROcodone-acetaminophen (NORCO/VICODIN) 5-325 MG tablet Take 1 tablet by mouth every 6 (six) hours as needed for moderate pain. 11/14/17  Yes Volanda Napoleon, MD  lidocaine-prilocaine (EMLA) cream Apply 1 application topically as needed. Patient taking differently: Apply 1 application topically daily as needed (prior to port being accessed.).  04/13/17  Yes Ennever, Rudell Cobb, MD  XARELTO 10 MG TABS tablet TAKE 1 TABLET(10 MG) BY MOUTH  DAILY 12/05/17  Yes Anyanwu, Sallyanne Havers, MD  amoxicillin (AMOXIL) 500 MG capsule Take 1 capsule (500 mg total) by mouth 3 (three) times daily. 12/09/17   Kinnie Feil, PA-C  HYDROmorphone (DILAUDID) 2 MG tablet Take 1 tablet (2 mg total) by mouth every 4 (four) hours as needed for severe pain. 11/10/17   Osborne Oman, MD    Family History Family History  Problem Relation Age of Onset  . Hypertension Mother   . Diabetes Mellitus II Mother   . Other Mother        hx of hysterectomy for unspecified reason  . Hypertension Sister   . Hypertension Brother   . Heart failure Maternal Grandmother   . Heart Problems  Maternal Grandmother   . Hypertension Maternal Grandfather   . Diabetes Maternal Grandfather   . Colon cancer Paternal Uncle        dx. late 50s-early 60s  . Gastric cancer Cousin 50  . Colon cancer Cousin        dx. early-late 55s; (father also had colon cancer)  . Colon cancer Paternal Uncle        dx. late 50s-early 60s  . Lung cancer Paternal Uncle        dx. 77s; smoker  . Colon polyps Paternal Uncle        unknown number  . Heart attack Maternal Aunt   . Heart Problems Paternal Aunt   . Heart attack Paternal Grandfather   . Colon cancer Paternal Grandfather        dx. 50s-60s  . Colon polyps Brother        1 maternal half-brother had approx 3 polyps  . Breast cancer Cousin 4  . Breast cancer Cousin        dx. early 32s or earlier (father had colon cancer)  . Lung cancer Other        PGF's brothers (x4); dx. later in life; lim info    Social History Social History   Tobacco Use  . Smoking status: Former Smoker    Packs/day: 1.00    Years: 16.00    Pack years: 16.00    Types: Cigarettes    Last attempt to quit: 02/21/1998    Years since quitting: 19.8  . Smokeless tobacco: Never Used  . Tobacco comment: 0.5-1.0 ppd for 16 yrs  Substance Use Topics  . Alcohol use: Yes    Alcohol/week: 0.0 standard drinks    Comment: occasional wine  . Drug use:  No     Allergies   Tape   Review of Systems Review of Systems  HENT: Positive for congestion and ear pain.   All other systems reviewed and are negative.    Physical Exam Updated Vital Signs BP (!) 145/96 (BP Location: Right Arm)   Pulse 80   Temp 98.3 F (36.8 C) (Oral)   Resp 18   Ht 5\' 1"  (1.549 m)   Wt 72.6 kg   LMP 02/28/2016 (Exact Date)   SpO2 100%   BMI 30.23 kg/m   Physical Exam  Constitutional: She is oriented to person, place, and time. She appears well-developed and well-nourished. No distress.  NAD.  HENT:  Head: Normocephalic and atraumatic.  Right Ear: External ear normal. Tympanic membrane is erythematous.  Left Ear: External ear normal.  Nose: Mucosal edema present.  Head: mild tenderness to right occipital region w/o lesions, edema, warmth, fluctuancep. Frontal and maxillary sinuses are non-tender to percussion.  Ears: upper half of right TM is erythematous obstructing bony landmarks.  No tenderness in mastoid area. R and L external ear auditory canals clear without edema or erythema. No pain reported with external ear manipulation. Left TM normal.   Nose: mild mucosa edema with erythema, no rhinorrhea.  Septum midline.   Throat: Oropharynx and tonsils normal.  Eyes: Conjunctivae and EOM are normal.  Neck: Normal range of motion. Neck supple.  Cardiovascular: Normal rate, regular rhythm and normal heart sounds.  Pulmonary/Chest: Effort normal and breath sounds normal.  Musculoskeletal: Normal range of motion. She exhibits no deformity.  Neurological: She is alert and oriented to person, place, and time.  Skin: Skin is warm and dry. Capillary refill takes less than 2 seconds.  Psychiatric: She  has a normal mood and affect. Her behavior is normal. Judgment and thought content normal.  Nursing note and vitals reviewed.    ED Treatments / Results  Labs (all labs ordered are listed, but only abnormal results are displayed) Labs Reviewed - No  data to display  EKG None  Radiology No results found.  Procedures Procedures (including critical care time)  Medications Ordered in ED Medications - No data to display   Initial Impression / Assessment and Plan / ED Course  I have reviewed the triage vital signs and the nursing notes.  Pertinent labs & imaging results that were available during my care of the patient were reviewed by me and considered in my medical decision making (see chart for details).  Clinical Course as of Dec 10 1934  Sat Dec 09, 2017  [facility] [place]    [CG]    Clinical Course User Index [CG] Kinnie Feil, [place]    Exam was consistent with early right otitis media.  No rupture, perforation, signs of mastoiditis, external ear infection, foreign bodies, cerumen impaction.  Will discharge with oral antibiotics.  Over-the-counter NSAIDs for pain.  Instructed patient to avoid placing any sweet oil or any other solutions in her ear.  Discussed return precautions.  Patient is in agreement.  Final Clinical Impressions(s) / ED Diagnoses   Final diagnoses:  Non-recurrent acute suppurative otitis media of right ear without spontaneous rupture of tympanic membrane    ED Discharge Orders         Ordered    amoxicillin (AMOXIL) 500 MG capsule  3 times daily     12/09/17 1744           Kinnie Feil, PA-C 12/09/17 1936    Elnora Morrison, MD 12/09/17 2230

## 2017-12-09 NOTE — ED Triage Notes (Signed)
Pt c/o pain in right side of neck and right ear since yesterday. States she put sweet oil in her ear which initially helped but now pain is worse

## 2017-12-13 ENCOUNTER — Encounter: Payer: Self-pay | Admitting: Obstetrics & Gynecology

## 2017-12-14 ENCOUNTER — Other Ambulatory Visit: Payer: Self-pay | Admitting: *Deleted

## 2017-12-14 DIAGNOSIS — C169 Malignant neoplasm of stomach, unspecified: Secondary | ICD-10-CM

## 2017-12-14 MED ORDER — HYDROCODONE-ACETAMINOPHEN 5-325 MG PO TABS: 1.0000 | ORAL_TABLET | Freq: Four times a day (QID) | ORAL | 0 refills | Status: DC | PRN

## 2017-12-15 ENCOUNTER — Ambulatory Visit (INDEPENDENT_AMBULATORY_CARE_PROVIDER_SITE_OTHER): Payer: Medicare Other | Admitting: Obstetrics & Gynecology

## 2017-12-15 ENCOUNTER — Encounter: Payer: Self-pay | Admitting: Obstetrics & Gynecology

## 2017-12-15 VITALS — BP 111/79 | HR 72 | Ht 62.0 in | Wt 156.5 lb

## 2017-12-15 DIAGNOSIS — R399 Unspecified symptoms and signs involving the genitourinary system: Secondary | ICD-10-CM

## 2017-12-15 DIAGNOSIS — Z90710 Acquired absence of both cervix and uterus: Secondary | ICD-10-CM

## 2017-12-15 DIAGNOSIS — Z09 Encounter for follow-up examination after completed treatment for conditions other than malignant neoplasm: Secondary | ICD-10-CM

## 2017-12-15 LAB — POCT URINALYSIS DIP (DEVICE)
Glucose, UA: NEGATIVE mg/dL
Hgb urine dipstick: NEGATIVE
Leukocytes, UA: NEGATIVE
Nitrite: NEGATIVE
PH: 5.5 (ref 5.0–8.0)
PROTEIN: NEGATIVE mg/dL
Urobilinogen, UA: 1 mg/dL (ref 0.0–1.0)

## 2017-12-15 NOTE — Progress Notes (Signed)
Subjective:     Paula [name] is a 52 y.o. female who presents to the clinic 5 weeks status post Total Vaginal Hysterectomy, Left Salpingectomy for Abnormal uterine bleeding refractory to medical managerment. Eating a regular diet without difficulty. Bowel movements are normal. The patient is not having any pain but reports having some pelvic pressure and "strong urine". Had negative UTI evaluation last visit, culture was negative on 11/24/2017. No other postoperative concerns.   The following portions of the patient's history were reviewed and updated as appropriate: allergies, current medications, past family history, past medical history, past social history, past surgical history and problem list.  Review of Systems Pertinent items noted in HPI and remainder of comprehensive ROS otherwise negative.    Objective:    BP 111/79   Pulse 72   Ht 5\' 2"  (1.575 m)   Wt 156 lb 8 oz (71 kg)   LMP 02/28/2016 (Exact Date)   BMI 28.62 kg/m  General:  alert and no distress  Abdomen: soft, bowel sounds active, non-tender  Pelvic:   intact vaginal cuff, some sutures still visible, normal discharge, no lesions.     11/09/2017 Surgical Pathology Diagnosis Uterus and cervix, and left fallopian tube - UTERUS: -ENDOMETRIUM: CYSTIC ATROPHY WITH PROGESTIN EFFECT. NO HYPERPLASIA OR MALIGNANCY. -MYOMETRIUM: LEIOMYOMATA. ADENOMYOSIS. NO MALIGNANCY. -SEROSA: UNREMARKABLE. NO MALIGNANCY. - CERVIX: BENIGN ENDOCERVICAL TYPE POLYP. NO DYSPLASIA OR MALIGNANCY. - LEFT FALLOPIAN TUBE: PARATUBAL CYST. NO MALIGNANCY.  Results for orders placed or performed in visit on 12/15/17 (from the past 24 hour(s))  POCT urinalysis dip (device)     Status: Abnormal   Collection Time: 12/15/17 12:11 PM  Result Value Ref Range   Glucose, UA NEGATIVE NEGATIVE mg/dL   Bilirubin Urine SMALL (A) NEGATIVE   Ketones, ur TRACE (A) NEGATIVE mg/dL   Specific Gravity, Urine >=1.030 1.005 - 1.030   Hgb urine dipstick NEGATIVE  NEGATIVE   pH 5.5 5.0 - 8.0   Protein, ur NEGATIVE NEGATIVE mg/dL   Urobilinogen, UA 1.0 0.0 - 1.0 mg/dL   Nitrite NEGATIVE NEGATIVE   Leukocytes, UA NEGATIVE NEGATIVE    Assessment:    Doing well postoperatively. Operative findings again reviewed. Pathology report discussed.    Plan:   1. Continue any current medications. 2. Wound care discussed. 3. Activity restrictions: none 4. Anticipated return to work: not applicable. 5. Patient reassured that she does not have a UTI.  6. Follow up as needed for any gynecologic concerns.    Verita Schneiders, MD, [place] for [employer], [place]

## 2017-12-15 NOTE — Patient Instructions (Signed)
Return to clinic for any scheduled appointments or for any gynecologic concerns as needed.   

## 2018-01-03 ENCOUNTER — Inpatient Hospital Stay (HOSPITAL_BASED_OUTPATIENT_CLINIC_OR_DEPARTMENT_OTHER): Payer: Medicare Other | Admitting: Family

## 2018-01-03 ENCOUNTER — Other Ambulatory Visit: Payer: Self-pay | Admitting: Obstetrics & Gynecology

## 2018-01-03 ENCOUNTER — Inpatient Hospital Stay: Payer: Medicare Other

## 2018-01-03 ENCOUNTER — Inpatient Hospital Stay: Payer: Medicare Other | Attending: Hematology & Oncology

## 2018-01-03 ENCOUNTER — Other Ambulatory Visit: Payer: Self-pay

## 2018-01-03 VITALS — BP 136/80 | HR 79 | Temp 98.6°F | Resp 16 | Wt 157.5 lb

## 2018-01-03 DIAGNOSIS — Z903 Acquired absence of stomach [part of]: Secondary | ICD-10-CM | POA: Diagnosis not present

## 2018-01-03 DIAGNOSIS — R112 Nausea with vomiting, unspecified: Secondary | ICD-10-CM

## 2018-01-03 DIAGNOSIS — R1111 Vomiting without nausea: Secondary | ICD-10-CM | POA: Diagnosis not present

## 2018-01-03 DIAGNOSIS — C169 Malignant neoplasm of stomach, unspecified: Secondary | ICD-10-CM

## 2018-01-03 DIAGNOSIS — D509 Iron deficiency anemia, unspecified: Secondary | ICD-10-CM | POA: Diagnosis not present

## 2018-01-03 DIAGNOSIS — D500 Iron deficiency anemia secondary to blood loss (chronic): Secondary | ICD-10-CM

## 2018-01-03 DIAGNOSIS — C163 Malignant neoplasm of pyloric antrum: Secondary | ICD-10-CM

## 2018-01-03 DIAGNOSIS — Z85028 Personal history of other malignant neoplasm of stomach: Secondary | ICD-10-CM

## 2018-01-03 DIAGNOSIS — K909 Intestinal malabsorption, unspecified: Secondary | ICD-10-CM

## 2018-01-03 DIAGNOSIS — Z79899 Other long term (current) drug therapy: Secondary | ICD-10-CM | POA: Insufficient documentation

## 2018-01-03 LAB — CBC WITH DIFFERENTIAL (CANCER CENTER ONLY)
ABS IMMATURE GRANULOCYTES: 0.01 10*3/uL (ref 0.00–0.07)
BASOS ABS: 0 10*3/uL (ref 0.0–0.1)
Basophils Relative: 1 %
Eosinophils Absolute: 0.1 10*3/uL (ref 0.0–0.5)
Eosinophils Relative: 1 %
HEMATOCRIT: 38 % (ref 36.0–46.0)
HEMOGLOBIN: 11.6 g/dL — AB (ref 12.0–15.0)
IMMATURE GRANULOCYTES: 0 %
LYMPHS ABS: 1.7 10*3/uL (ref 0.7–4.0)
Lymphocytes Relative: 36 %
MCH: 28.6 pg (ref 26.0–34.0)
MCHC: 30.5 g/dL (ref 30.0–36.0)
MCV: 93.8 fL (ref 80.0–100.0)
MONOS PCT: 10 %
Monocytes Absolute: 0.5 10*3/uL (ref 0.1–1.0)
NEUTROS ABS: 2.5 10*3/uL (ref 1.7–7.7)
NEUTROS PCT: 52 %
NRBC: 0 % (ref 0.0–0.2)
Platelet Count: 274 10*3/uL (ref 150–400)
RBC: 4.05 MIL/uL (ref 3.87–5.11)
RDW: 14.1 % (ref 11.5–15.5)
WBC Count: 4.7 10*3/uL (ref 4.0–10.5)

## 2018-01-03 LAB — CMP (CANCER CENTER ONLY)
ALBUMIN: 3.5 g/dL (ref 3.5–5.0)
ALT: 26 U/L (ref 10–47)
AST: 30 U/L (ref 11–38)
Alkaline Phosphatase: 80 U/L (ref 26–84)
Anion gap: 15 (ref 5–15)
BILIRUBIN TOTAL: 0.7 mg/dL (ref 0.2–1.6)
BUN: 11 mg/dL (ref 7–22)
CALCIUM: 9.1 mg/dL (ref 8.0–10.3)
CO2: 27 mmol/L (ref 18–33)
CREATININE: 1 mg/dL (ref 0.60–1.20)
Chloride: 105 mmol/L (ref 98–108)
Glucose, Bld: 155 mg/dL — ABNORMAL HIGH (ref 73–118)
Potassium: 3.6 mmol/L (ref 3.3–4.7)
Sodium: 147 mmol/L — ABNORMAL HIGH (ref 128–145)
TOTAL PROTEIN: 6.7 g/dL (ref 6.4–8.1)

## 2018-01-03 MED ORDER — HEPARIN SOD (PORK) LOCK FLUSH 100 UNIT/ML IV SOLN
500.0000 [IU] | Freq: Once | INTRAVENOUS | Status: AC
  Administered 2018-01-03: 500 [IU] via INTRAVENOUS
  Filled 2018-01-03: qty 5

## 2018-01-03 MED ORDER — SODIUM CHLORIDE 0.9% FLUSH
10.0000 mL | INTRAVENOUS | Status: AC | PRN
  Administered 2018-01-03: 10 mL via INTRAVENOUS
  Filled 2018-01-03: qty 10

## 2018-01-03 NOTE — Patient Instructions (Signed)
Implanted Port Home Guide An implanted port is a type of central line that is placed under the skin. Central lines are used to provide IV access when treatment or nutrition needs to be given through a person's veins. Implanted ports are used for long-term IV access. An implanted port may be placed because:  You need IV medicine that would be irritating to the small veins in your hands or arms.  You need long-term IV medicines, such as antibiotics.  You need IV nutrition for a long period.  You need frequent blood draws for lab tests.  You need dialysis.  Implanted ports are usually placed in the chest area, but they can also be placed in the upper arm, the abdomen, or the leg. An implanted port has two main parts:  Reservoir. The reservoir is round and will appear as a small, raised area under your skin. The reservoir is the part where a needle is inserted to give medicines or draw blood.  Catheter. The catheter is a thin, flexible tube that extends from the reservoir. The catheter is placed into a large vein. Medicine that is inserted into the reservoir goes into the catheter and then into the vein.  How will I care for my incision site? Do not get the incision site wet. Bathe or shower as directed by your health care provider. How is my port accessed? Special steps must be taken to access the port:  Before the port is accessed, a numbing cream can be placed on the skin. This helps numb the skin over the port site.  Your health care provider uses a sterile technique to access the port. ? Your health care provider must put on a mask and sterile gloves. ? The skin over your port is cleaned carefully with an antiseptic and allowed to dry. ? The port is gently pinched between sterile gloves, and a needle is inserted into the port.  Only "non-coring" port needles should be used to access the port. Once the port is accessed, a blood return should be checked. This helps ensure that the port  is in the vein and is not clogged.  If your port needs to remain accessed for a constant infusion, a clear (transparent) bandage will be placed over the needle site. The bandage and needle will need to be changed every week, or as directed by your health care provider.  Keep the bandage covering the needle clean and dry. Do not get it wet. Follow your health care provider's instructions on how to take a shower or bath while the port is accessed.  If your port does not need to stay accessed, no bandage is needed over the port.  What is flushing? Flushing helps keep the port from getting clogged. Follow your health care provider's instructions on how and when to flush the port. Ports are usually flushed with saline solution or a medicine called heparin. The need for flushing will depend on how the port is used.  If the port is used for intermittent medicines or blood draws, the port will need to be flushed: ? After medicines have been given. ? After blood has been drawn. ? As part of routine maintenance.  If a constant infusion is running, the port may not need to be flushed.  How long will my port stay implanted? The port can stay in for as long as your health care provider thinks it is needed. When it is time for the port to come out, surgery will be   done to remove it. The procedure is similar to the one performed when the port was put in. When should I seek immediate medical care? When you have an implanted port, you should seek immediate medical care if:  You notice a bad smell coming from the incision site.  You have swelling, redness, or drainage at the incision site.  You have more swelling or pain at the port site or the surrounding area.  You have a fever that is not controlled with medicine.  This information is not intended to replace advice given to you by your health care provider. Make sure you discuss any questions you have with your health care provider. Document  Released: 02/07/2005 Document Revised: 07/16/2015 Document Reviewed: 10/15/2012 Elsevier Interactive Patient Education  2017 [employer]  

## 2018-01-03 NOTE — Addendum Note (Signed)
Addended by: Randolm Idol on: 01/03/2018 01:17 PM   Modules accepted: Orders, SmartSet

## 2018-01-03 NOTE — Progress Notes (Signed)
Hematology and Oncology Follow Up Visit  Paula [name] [id number] 1966/01/13 52 y.o. 01/03/2018   Principle Diagnosis:  Stage IIA (T1a, N2, M0) gastric adenocarcinoma diagnosed in July of 2015 Intermittent iron deficiency anemia  Past Therapy: Status post diagnostic laparoscopy, distal gastrectomy with billroth II anastamosis and feeding gastrojejunostomy tube under the care of Dr. Barry Dienes on 09/20/2013 Adjuvant systemic chemotherapy with 5-FU 600 mg/M2 with leucovorin on days 1, 8, 15, 22, 29 and 36 every 8 weeks Adjuvant concurrent chemoradiation with Xeloda 650 mg/M2 by mouth twice a day for 5 days every week with radiation. The patient completed the course of radiotherapy but she declined to take Xeloda during her radiation  Current Therapy:   Observation IV iron as indicated    Interim History:  [name] is here today for follow-up with c/o vomiting. She has had 2 episodes in the last month where she vomited 10 times in a row despite having taken Phenergan prior to eating. She has history of gastrectomy as well as a hiatal hernia. Her last endoscopy was with Dr. Selina Cooley in March. We will refer her back to GI.  She has intermittent abdominal pain. No constipation or diarrhea.  Her appetite comes and goes. She is trying to remain hydrated. Her weight is stable.  She has not noted any episodes of bleeding, no bruising or petechiae.  No fever, chills, n/v, cough, rash, dizziness, SOB, chest pain, palpitations or changes in bowel or bladder habits.  No swelling, tenderness, numbness or tingling in her extremities.  No lymphadenopathy noted on exam.   ECOG Performance Status: 1 - Symptomatic but completely ambulatory  Medications:  Allergies as of 01/03/2018      Reactions   Tape Itching   Ok with adhesive tape, ok with paper tape per patient      Medication List        Accurate as of 01/03/18  1:36 PM. Always use your most recent med list.          acetaminophen 500 MG tablet Commonly known as:  TYLENOL Take 2 tablets (1,000 mg total) by mouth every 6 (six) hours as needed for mild pain or moderate pain.   amoxicillin 500 MG capsule Commonly known as:  AMOXIL Take 1 capsule (500 mg total) by mouth 3 (three) times daily.   carvedilol 6.25 MG tablet Commonly known as:  COREG Take 6.25 mg by mouth 2 (two) times daily.   diclofenac sodium 1 % Gel Commonly known as:  VOLTAREN Apply 2-4 g topically 2 (two) times daily as needed for pain.   fluticasone 50 MCG/ACT nasal spray Commonly known as:  FLONASE   HYDROcodone-acetaminophen 5-325 MG tablet Commonly known as:  NORCO/VICODIN Take 1 tablet by mouth every 6 (six) hours as needed for moderate pain.   lidocaine-prilocaine cream Commonly known as:  EMLA Apply 1 application topically as needed.   omeprazole 20 MG capsule Commonly known as:  PRILOSEC 20 mg 3 (three) times daily as needed.   promethazine 25 MG tablet Commonly known as:  PHENERGAN Take 25 mg by mouth every 6 (six) hours as needed for nausea or vomiting.   XARELTO 10 MG Tabs tablet Generic drug:  rivaroxaban TAKE 1 TABLET(10 MG) BY MOUTH DAILY       Allergies:  Allergies  Allergen Reactions  . Tape Itching    Ok with adhesive tape, ok with paper tape per patient    Past Medical History, Surgical history, Social history, and Family History  were reviewed and updated.  Review of Systems: All other 10 point review of systems is negative.   Physical Exam:  vitals were not taken for this visit.   Wt Readings from Last 3 Encounters:  01/03/18 157 lb 8 oz (71.4 kg)  12/15/17 156 lb 8 oz (71 kg)  12/09/17 160 lb (72.6 kg)    Ocular: Sclerae unicteric, pupils equal, round and reactive to light Ear-nose-throat: Oropharynx clear, dentition fair Lymphatic: No cervical, supraclavicular or axillary adenopathy Lungs no rales or rhonchi, good excursion bilaterally Heart regular rate and rhythm, no  murmur appreciated Abd soft, nontender, positive bowel sounds, no liver or spleen tip palpated on exam, no fluid wave  MSK no focal spinal tenderness, no joint edema Neuro: non-focal, well-oriented, appropriate affect Breasts: Deferred   Lab Results  Component Value Date   WBC 4.7 01/03/2018   HGB 11.6 (L) 01/03/2018   HCT 38.0 01/03/2018   MCV 93.8 01/03/2018   PLT 274 01/03/2018   Lab Results  Component Value Date   FERRITIN 347 (H) 11/17/2017   IRON 71 11/17/2017   TIBC 215 (L) 11/17/2017   UIBC 144 11/17/2017   IRONPCTSAT 33 11/17/2017   Lab Results  Component Value Date   RBC 4.05 01/03/2018   No results found for: Nils Pyle [facility] Lab Results  Component Value Date   IGGSERUM 1,273 05/21/2015   IGMSERUM 40 05/21/2015   No results found for: Odetta Pink, SPEI   Chemistry      Component Value Date/Time   NA 136 11/10/2017 0559   NA 140 01/26/2017 1002   NA 140 09/24/2015 0953   K 4.5 11/10/2017 0559   K 3.5 01/26/2017 1002   K 4.0 09/24/2015 0953   CL 102 11/10/2017 0559   CL 106 01/26/2017 1002   CO2 27 11/10/2017 0559   CO2 26 01/26/2017 1002   CO2 23 09/24/2015 0953   BUN 8 11/10/2017 0559   BUN 6 (L) 01/26/2017 1002   BUN 8.6 09/24/2015 0953   CREATININE 0.80 11/10/2017 0559   CREATININE 0.90 10/03/2017 1143   CREATININE 1.1 01/26/2017 1002   CREATININE 1.0 09/24/2015 0953      Component Value Date/Time   CALCIUM 8.3 (L) 11/10/2017 0559   CALCIUM 8.9 01/26/2017 1002   CALCIUM 9.6 09/24/2015 0953   ALKPHOS 58 11/10/2017 0559   ALKPHOS 68 01/26/2017 1002   ALKPHOS 99 09/24/2015 0953   AST 15 11/10/2017 0559   AST 21 10/03/2017 1143   AST 17 09/24/2015 0953   ALT 14 11/10/2017 0559   ALT 15 10/03/2017 1143   ALT 17 01/26/2017 1002   ALT 11 09/24/2015 0953   BILITOT 0.8 11/10/2017 0559   BILITOT 0.5 10/03/2017 1143   BILITOT 0.83 09/24/2015 0953       Impression  and Plan: [name] is a very pleasant 51 yo African American female with history of stage II gastric cancer and partial gastrectomy followed by adjuvant chemo and radiation. She is now having episodes of frequent vomiting when she tries to eat despite taking Phenergan.  I spoke with Dr. Marin Olp and we will hold off on scans for now.  We will refer her back to GI for further work up and repeat endoscopy.  We will see what her iron studies show and bring her back in for infusion if needed.  She will come back in another 3 months for follow-up.  She will contact our office with  any questions or concerns. We can certainly see her sooner if need be.   Laverna Peace, NP 11/13/20191:36 PM

## 2018-01-04 ENCOUNTER — Telehealth: Payer: Self-pay | Admitting: Family

## 2018-01-04 LAB — IRON AND TIBC
Iron: 111 ug/dL (ref 41–142)
Saturation Ratios: 50 % (ref 21–57)
TIBC: 221 ug/dL — ABNORMAL LOW (ref 236–444)
UIBC: 111 ug/dL — ABNORMAL LOW (ref 120–384)

## 2018-01-04 LAB — FERRITIN: Ferritin: 288 ng/mL (ref 11–307)

## 2018-01-04 NOTE — Telephone Encounter (Signed)
MVM for patient with date/time/location of appointment with Dr Lenna Sciara [facility] on 02/01/18 @ 2:20.

## 2018-01-11 ENCOUNTER — Telehealth: Payer: Self-pay | Admitting: *Deleted

## 2018-01-11 NOTE — Telephone Encounter (Signed)
Message received from patient wanting iron results from last week.  Call placed back to patient and patient notified that iron results are WNL and no iron infusion is needed at this time.  Pt appreciative of call back and has no further questions at this time.

## 2018-01-15 ENCOUNTER — Other Ambulatory Visit: Payer: Self-pay | Admitting: *Deleted

## 2018-01-15 DIAGNOSIS — C169 Malignant neoplasm of stomach, unspecified: Secondary | ICD-10-CM

## 2018-01-15 DIAGNOSIS — C163 Malignant neoplasm of pyloric antrum: Secondary | ICD-10-CM

## 2018-01-15 MED ORDER — HYDROCODONE-ACETAMINOPHEN 5-325 MG PO TABS: 1.0000 | ORAL_TABLET | Freq: Four times a day (QID) | ORAL | 0 refills | Status: DC | PRN

## 2018-02-13 ENCOUNTER — Other Ambulatory Visit: Payer: Self-pay | Admitting: *Deleted

## 2018-02-13 DIAGNOSIS — C163 Malignant neoplasm of pyloric antrum: Secondary | ICD-10-CM

## 2018-02-13 DIAGNOSIS — C169 Malignant neoplasm of stomach, unspecified: Secondary | ICD-10-CM

## 2018-02-13 MED ORDER — HYDROCODONE-ACETAMINOPHEN 5-325 MG PO TABS: 1.0000 | ORAL_TABLET | Freq: Four times a day (QID) | ORAL | 0 refills | Status: DC | PRN

## 2018-02-24 ENCOUNTER — Telehealth: Payer: Self-pay | Admitting: Hematology & Oncology

## 2018-02-24 NOTE — Telephone Encounter (Signed)
MEDICAL RECORDS REQUEST  RECORDS FAXED TO:  [facility] FAX # [phone]  RECORDS INDEXED

## 2018-03-14 ENCOUNTER — Emergency Department (HOSPITAL_BASED_OUTPATIENT_CLINIC_OR_DEPARTMENT_OTHER): Payer: Medicare Other

## 2018-03-14 ENCOUNTER — Other Ambulatory Visit: Payer: Self-pay

## 2018-03-14 ENCOUNTER — Encounter (HOSPITAL_BASED_OUTPATIENT_CLINIC_OR_DEPARTMENT_OTHER): Payer: Self-pay | Admitting: Emergency Medicine

## 2018-03-14 ENCOUNTER — Emergency Department (HOSPITAL_BASED_OUTPATIENT_CLINIC_OR_DEPARTMENT_OTHER)
Admission: EM | Admit: 2018-03-14 | Discharge: 2018-03-14 | Disposition: A | Discharge status: Home / Self Care | Payer: Medicare Other | Attending: Emergency Medicine | Admitting: Emergency Medicine

## 2018-03-14 DIAGNOSIS — M545 Low back pain, unspecified: Secondary | ICD-10-CM

## 2018-03-14 DIAGNOSIS — Z7901 Long term (current) use of anticoagulants: Secondary | ICD-10-CM | POA: Diagnosis not present

## 2018-03-14 DIAGNOSIS — R112 Nausea with vomiting, unspecified: Secondary | ICD-10-CM | POA: Diagnosis not present

## 2018-03-14 DIAGNOSIS — I10 Essential (primary) hypertension: Secondary | ICD-10-CM | POA: Insufficient documentation

## 2018-03-14 DIAGNOSIS — Z85028 Personal history of other malignant neoplasm of stomach: Secondary | ICD-10-CM | POA: Diagnosis not present

## 2018-03-14 DIAGNOSIS — Z87891 Personal history of nicotine dependence: Secondary | ICD-10-CM | POA: Diagnosis not present

## 2018-03-14 DIAGNOSIS — R109 Unspecified abdominal pain: Secondary | ICD-10-CM | POA: Insufficient documentation

## 2018-03-14 DIAGNOSIS — Z79899 Other long term (current) drug therapy: Secondary | ICD-10-CM | POA: Insufficient documentation

## 2018-03-14 LAB — CBC WITH DIFFERENTIAL/PLATELET
Abs Immature Granulocytes: 0 10*3/uL (ref 0.00–0.07)
Basophils Absolute: 0 10*3/uL (ref 0.0–0.1)
Basophils Relative: 1 %
Eosinophils Absolute: 0.2 10*3/uL (ref 0.0–0.5)
Eosinophils Relative: 4 %
HCT: 38 % (ref 36.0–46.0)
Hemoglobin: 11.5 g/dL — ABNORMAL LOW (ref 12.0–15.0)
Immature Granulocytes: 0 %
Lymphocytes Relative: 34 %
Lymphs Abs: 1.3 10*3/uL (ref 0.7–4.0)
MCH: 28.3 pg (ref 26.0–34.0)
MCHC: 30.3 g/dL (ref 30.0–36.0)
MCV: 93.4 fL (ref 80.0–100.0)
MONO ABS: 0.4 10*3/uL (ref 0.1–1.0)
Monocytes Relative: 10 %
Neutro Abs: 2 10*3/uL (ref 1.7–7.7)
Neutrophils Relative %: 51 %
Platelets: 256 10*3/uL (ref 150–400)
RBC: 4.07 MIL/uL (ref 3.87–5.11)
RDW: 12.9 % (ref 11.5–15.5)
WBC: 3.9 10*3/uL — ABNORMAL LOW (ref 4.0–10.5)
nRBC: 0 % (ref 0.0–0.2)

## 2018-03-14 LAB — COMPREHENSIVE METABOLIC PANEL
ALT: 18 U/L (ref 0–44)
AST: 21 U/L (ref 15–41)
Albumin: 3.7 g/dL (ref 3.5–5.0)
Alkaline Phosphatase: 75 U/L (ref 38–126)
Anion gap: 4 — ABNORMAL LOW (ref 5–15)
BILIRUBIN TOTAL: 0.8 mg/dL (ref 0.3–1.2)
BUN: 12 mg/dL (ref 6–20)
CO2: 27 mmol/L (ref 22–32)
Calcium: 8.8 mg/dL — ABNORMAL LOW (ref 8.9–10.3)
Chloride: 104 mmol/L (ref 98–111)
Creatinine, Ser: 0.77 mg/dL (ref 0.44–1.00)
GFR calc Af Amer: >60 mL/min (ref >60)
GFR calc non Af Amer: >60 mL/min (ref >60)
Glucose, Bld: 123 mg/dL — ABNORMAL HIGH (ref 70–99)
Potassium: 3.8 mmol/L (ref 3.5–5.1)
Sodium: 135 mmol/L (ref 135–145)
TOTAL PROTEIN: 6.6 g/dL (ref 6.5–8.1)

## 2018-03-14 LAB — URINALYSIS, ROUTINE W REFLEX MICROSCOPIC
Bilirubin Urine: NEGATIVE
Glucose, UA: NEGATIVE mg/dL
HGB URINE DIPSTICK: NEGATIVE
Ketones, ur: NEGATIVE mg/dL
Leukocytes, UA: NEGATIVE
Nitrite: NEGATIVE
Protein, ur: NEGATIVE mg/dL
Specific Gravity, Urine: 1.02 (ref 1.005–1.030)
pH: 6 (ref 5.0–8.0)

## 2018-03-14 LAB — LIPASE, BLOOD: Lipase: 18 U/L (ref 11–51)

## 2018-03-14 MED ORDER — MORPHINE SULFATE (PF) 4 MG/ML IV SOLN
4.0000 mg | Freq: Once | INTRAVENOUS | Status: AC
  Administered 2018-03-14: 4 mg via INTRAVENOUS
  Filled 2018-03-14: qty 1

## 2018-03-14 MED ORDER — METHOCARBAMOL 500 MG PO TABS: 500.0000 mg | ORAL_TABLET | Freq: Two times a day (BID) | ORAL | 0 refills | Status: DC

## 2018-03-14 MED ORDER — ONDANSETRON HCL 4 MG/2ML IJ SOLN
4.0000 mg | Freq: Once | INTRAMUSCULAR | Status: AC
  Administered 2018-03-14: 4 mg via INTRAVENOUS
  Filled 2018-03-14: qty 2

## 2018-03-14 MED ORDER — HEPARIN SOD (PORK) LOCK FLUSH 100 UNIT/ML IV SOLN
500.0000 [IU] | Freq: Once | INTRAVENOUS | Status: AC
  Administered 2018-03-14: 500 [IU]
  Filled 2018-03-14: qty 5

## 2018-03-14 MED ORDER — ONDANSETRON 4 MG PO TBDP: ORAL_TABLET | ORAL | 0 refills | Status: DC

## 2018-03-14 MED ORDER — IOPAMIDOL (ISOVUE-300) INJECTION 61%
100.0000 mL | Freq: Once | INTRAVENOUS | Status: AC | PRN
  Administered 2018-03-14: 100 mL via INTRAVENOUS

## 2018-03-14 MED ORDER — SODIUM CHLORIDE 0.9 % IV BOLUS
1000.0000 mL | Freq: Once | INTRAVENOUS | Status: AC
  Administered 2018-03-14: 1000 mL via INTRAVENOUS

## 2018-03-14 MED FILL — ONDANSETRON ODT 4 MG TABLET: 4 | 4 days supply | Qty: 10 | Fill #0

## 2018-03-14 MED FILL — METHOCARBAMOL 500 MG TABLET: 500 | 10 days supply | Qty: 20 | Fill #0

## 2018-03-14 NOTE — Discharge Instructions (Addendum)
Your work-up today is very reassuring and I think that your right flank pain is more likely related to musculoskeletal pain especially since you have pain continuing into your low back on that side as well.  Your urine shows no signs of infection today, you have normal kidney and liver function and your CT scan shows no evidence of kidney stone, infection or masses or any other new findings.  I am reassured that your vomiting stopped on Monday and has not continued.  You may use Zofran as needed for nausea, do not take this as well as Phenergan, please use one or the other.  In addition to your home pain medication you can use Robaxin as needed for back pain and muscle spasm.  If your symptoms are not improving I would like for you to schedule follow-up appointment with your primary care doctor early next week.    Return to the emergency department if you have significantly worsened flank or back pain, loss of control of your bowels or bladder any numbness weakness or tingling in your leg, fevers, persistent vomiting, any new abdominal pain or other new or concerning symptoms.

## 2018-03-14 NOTE — ED Provider Notes (Signed)
[facility] EMERGENCY DEPARTMENT Provider Note   CSN: [id number] Arrival date & time: 03/14/18  1245     History   Chief Complaint Chief Complaint  Patient presents with  . Flank Pain    HPI Paula [name] is a 53 y.o. female.  Paula [name] is a 53 y.o. female with history of hypertension, stomach cancer s/p resection and chemotherapy, in remission, GERD, and migraines, who presents to the emergency department for evaluation of Right flank pain.  Patient reports that over the past week she has had a dull ache over the right flank that is been worsening over the past few days and she reports over the past 2 days she has had some associated nausea with a few episodes of vomiting and noted some epigastric pain that began yesterday this has since improved and she last vomited yesterday afternoon but is concerning for vomiting related to the flank pain so presented for evaluation.  She denies any dysuria or urinary frequency.  No hematuria.  Denies any lower anterior abdominal pain.  No vaginal discharge or vaginal bleeding.  No prior history of kidney stones or kidney infections.  Other than the surgery for her stomach cancer she denies any other abdominal surgeries.  She has not taken anything to treat the symptoms prior to arrival, denies any other aggravating or alleviating factors.  She reports that the pain in her flank is starting to radiate down into her buttock he denies any midline back pain, no numbness weakness or tingling in her lower extremities and no loss of control of her bowels or bladder.  No fevers or chills.     Past Medical History:  Diagnosis Date  . Arthritis    back  . Costochondritis   . Esophageal reflux    On omeprazole  . Family history of cancer   . Headache(784.0) 2011   Migraines  . Heart murmur    never caused any problems  . History of hiatal hernia   . History of radiation therapy 05/12/14- 06/17/14   post-op gastric  region/ nodes/ 45 Gy in 25 fractions.   . Hypertension    started around 2011  . Iron deficiency anemia due to chronic blood loss 05/25/2015  . 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 On antineoplastic chemotherapy    5-FU and leucovorin  . Ovarian cyst   . Pneumonia    x 1  . Pulmonary emboli ([place]) 2015  . Stomach cancer ([place]) dx'd 08/2013  . Tuberculosis    ? [id number]  . Wears partial dentures    upper dentures    Patient Active Problem List   Diagnosis Date Noted  . S/P vaginal hysterectomy 11/09/2017  . Left knee pain 04/25/2017  . Iron deficiency anemia due to chronic blood loss 05/25/2015  . Healed or old pulmonary embolism 03/31/2015  . Genetic testing 01/23/2015  . Insomnia 06/30/2014  . Neutropenia ([place]) 02/24/2014  . Neuropathy due to chemotherapeutic drug ([place]) 12/18/2013  . Hypoalbuminemia 12/18/2013  . Malfunction of device 12/18/2013  . Leg edema 12/18/2013  . Neoplasm related pain 12/18/2013  . Other pancytopenia ([place]) 12/13/2013  . Hyponatremia 12/12/2013  . Hypokalemia 12/12/2013  . Weight loss 12/04/2013  . Abnormal loss of weight 12/04/2013  . Family history of cancer   . Cancer of antrum of stomach ([place]) 11/07/2013  . 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 Palliative care encounter 11/04/2013  . Acute kidney injury ([place])  11/03/2013  . Chronic low back pain 10/31/2013  . 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 Constipation, chronic 10/28/2013  . Protein-calorie malnutrition, severe ([place]) 10/23/2013  . Intractable nausea and vomiting 10/22/2013  . Intractable vomiting 10/22/2013  . 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 Carcinoma of stomach ([place]) 09/18/2013  . Antral ulcer 09/16/2013  . Migraine headache 09/14/2013  . PTSD (post-traumatic stress disorder) 08/19/2013  . Left knee injury 07/30/2013  . Mitral regurgitation 02/15/2013  . Chest pain 01/23/2012  .  Essential hypertension 01/23/2012  . Murmur 01/23/2012    Past Surgical History:  Procedure Laterality Date  . ABDOMINAL HYSTERECTOMY    . CESAREAN SECTION     x 2  . ESOPHAGOGASTRODUODENOSCOPY N/A 09/16/2013   Procedure: ESOPHAGOGASTRODUODENOSCOPY (EGD);  Surgeon: Wonda Horner, MD;  Location: [facility];  Service: Endoscopy;  Laterality: N/A;  . IR CV LINE INJECTION  08/30/2017  . IR FLUORO GUIDE PORT INSERTION RIGHT  03/21/2017  . IR US GUIDE VASC ACCESS RIGHT  03/21/2017  . LAPAROSCOPY N/A 09/20/2013   Procedure: DIAGNOSTIC LAPAROSCOPY,DISTAL GASTRECTOMY AND FEEDING GASTROJEJUNOSTOMY;  Surgeon: [name], MD;  Location: [facility];  Service: General;  Laterality: N/A;  . OOPHORECTOMY     Around 1985 left ovary removal  . PORTACATH PLACEMENT Left 11/18/2013   Procedure: INSERTION PORT-A-CATH;  Surgeon: [name], MD;  Location: [facility];  Service: General;  Laterality: Left;  . TONSILLECTOMY     Around 1994  . TUBAL LIGATION    . UNILATERAL SALPINGECTOMY Left 11/09/2017   Procedure: UNILATERAL SALPINGECTOMY;  Surgeon: Osborne Oman, MD;  Location: [facility];  Service: Gynecology;  Laterality: Left;  Marland Kitchen VAGINAL HYSTERECTOMY Left 11/09/2017   Procedure: HYSTERECTOMY VAGINAL WITH LEFT SALPINGECTOMY;  Surgeon: Osborne Oman, MD;  Location: [facility];  Service: Gynecology;  Laterality: Left;     OB History    Gravida  2   Para  2   Term  1   Preterm  1   AB      Living  2     SAB      TAB      Ectopic      Multiple      Live Births  2            Home Medications    Prior to Admission medications   Medication Sig Start Date End Date Taking? Authorizing Provider  acetaminophen (TYLENOL) 500 MG tablet Take 2 tablets (1,000 mg total) by mouth every 6 (six) hours as needed for mild pain or moderate pain. 11/10/17   Anyanwu, Sallyanne Havers, MD  amoxicillin (AMOXIL) 500 MG capsule Take 1 capsule (500 mg total) by mouth 3 (three) times daily. 12/09/17   Kinnie Feil,  PA-C  carvedilol (COREG) 6.25 MG tablet Take 6.25 mg by mouth 2 (two) times daily.     [provider]  diclofenac sodium (VOLTAREN) 1 % GEL Apply 2-4 g topically 2 (two) times daily as needed for pain. 10/13/17   [provider]  fluticasone Asencion Islam) 50 MCG/ACT nasal spray  11/23/17   [provider]  HYDROcodone-acetaminophen (NORCO/VICODIN) 5-325 MG tablet Take 1 tablet by mouth every 6 (six) hours as needed for moderate pain. 02/13/18   Volanda Napoleon, MD  lidocaine-prilocaine (EMLA) cream Apply 1 application topically as needed. 04/13/17   Volanda Napoleon, MD  methocarbamol (ROBAXIN) 500 MG tablet Take 1 tablet (500 mg total) by mouth 2 (two) times daily.  03/14/18   Jacqlyn Larsen, PA-C  omeprazole (PRILOSEC) 20 MG capsule 20 mg 3 (three) times daily as needed. 12/07/17   [provider]  ondansetron (ZOFRAN ODT) 4 MG disintegrating tablet 4mg  ODT q4 hours prn nausea/vomit 03/14/18   Jacqlyn Larsen, PA-C  promethazine (PHENERGAN) 25 MG tablet Take 25 mg by mouth every 6 (six) hours as needed for nausea or vomiting.    [provider]  XARELTO 10 MG TABS tablet TAKE 1 TABLET(10 MG) BY MOUTH DAILY 01/03/18   Anyanwu, Sallyanne Havers, MD    Family History Family History  Problem Relation Age of Onset  . Hypertension Mother   . Diabetes Mellitus II Mother   . Other Mother        hx of hysterectomy for unspecified reason  . Hypertension Sister   . Hypertension Brother   . Heart failure Maternal Grandmother   . Heart Problems Maternal Grandmother   . Hypertension Maternal Grandfather   . Diabetes Maternal Grandfather   . Colon cancer Paternal Uncle        dx. late 50s-early 60s  . Gastric cancer Cousin 75  . Colon cancer Cousin        dx. early-late 29s; (father also had colon cancer)  . Colon cancer Paternal Uncle        dx. late 50s-early 60s  . Lung cancer Paternal Uncle        dx. 43s; smoker  . Colon polyps Paternal Uncle        unknown  number  . Heart attack Maternal Aunt   . Heart Problems Paternal Aunt   . Heart attack Paternal Grandfather   . Colon cancer Paternal Grandfather        dx. 50s-60s  . Colon polyps Brother        1 maternal half-brother had approx 3 polyps  . Breast cancer Cousin 2  . Breast cancer Cousin        dx. early 83s or earlier (father had colon cancer)  . Lung cancer Other        PGF's brothers (x4); dx. later in life; lim info    Social History Social History   Tobacco Use  . Smoking status: Former Smoker    Packs/day: 1.00    Years: 16.00    Pack years: 16.00    Types: Cigarettes    Last attempt to quit: 02/21/1998    Years since quitting: 20.0  . Smokeless tobacco: Never Used  . Tobacco comment: 0.5-1.0 ppd for 16 yrs  Substance Use Topics  . Alcohol use: Yes    Alcohol/week: 0.0 standard drinks    Comment: occasional wine  . Drug use: No     Allergies   Tape   Review of Systems Review of Systems  Constitutional: Negative for chills and fever.  HENT: Negative.   Respiratory: Negative for cough and shortness of breath.   Cardiovascular: Negative for chest pain.  Gastrointestinal: Positive for abdominal pain, nausea and vomiting. Negative for constipation and diarrhea.  Genitourinary: Positive for flank pain. Negative for dysuria, frequency, hematuria, vaginal bleeding and vaginal discharge.  Musculoskeletal: Positive for back pain. Negative for arthralgias and myalgias.  Skin: Negative for color change and rash.  Neurological: Negative for dizziness, syncope, weakness, light-headedness, numbness and headaches.  All other systems reviewed and are negative.    Physical Exam Updated Vital Signs BP 104/65   Pulse 71   Temp 98.7 F (37.1 C) (Oral)   Resp 16  Ht 5\' 1"  (1.549 m)   Wt 72.6 kg   LMP 02/28/2016 (Exact Date)   SpO2 99%   BMI 30.23 kg/m   Physical Exam Vitals signs and nursing note reviewed.  Constitutional:      General: She is not in acute  distress.    Appearance: Normal appearance. She is well-developed. She is not ill-appearing or diaphoretic.  HENT:     Head: Normocephalic and atraumatic.     Mouth/Throat:     Mouth: Mucous membranes are moist.     Pharynx: Oropharynx is clear.  Eyes:     General:        Right eye: No discharge.        Left eye: No discharge.     Pupils: Pupils are equal, round, and reactive to light.  Neck:     Musculoskeletal: Neck supple.  Cardiovascular:     Rate and Rhythm: Normal rate and regular rhythm.     Heart sounds: Normal heart sounds.  Pulmonary:     Effort: Pulmonary effort is normal. No respiratory distress.     Breath sounds: Normal breath sounds. No wheezing or rales.  Abdominal:     General: Abdomen is flat. Bowel sounds are normal. There is no distension.     Palpations: Abdomen is soft. There is no mass.     Tenderness: There is abdominal tenderness. There is no guarding.     Comments: Abdomen soft, nondistended, mild epigastric tenderness without guarding , all other quadrants NTTP, no peritoneal signs, tenderness over right flank  Musculoskeletal:        General: No deformity.     Comments: Tenderness over right flank and right-sided back musculature, no midline spinal tenderness.  Skin:    General: Skin is warm and dry.     Capillary Refill: Capillary refill takes less than 2 seconds.  Neurological:     Mental Status: She is alert and oriented to person, place, and time.     Coordination: Coordination normal.     Comments: Speech is clear, able to follow commands CN III-XII intact Normal strength in upper and lower extremities bilaterally including dorsiflexion and plantar flexion, strong and equal grip strength Sensation normal to light and sharp touch Moves extremities without ataxia, coordination intact  Psychiatric:        Mood and Affect: Mood normal.        Behavior: Behavior normal.      ED Treatments / Results  Labs (all labs ordered are listed, but  only abnormal results are displayed) Labs Reviewed  COMPREHENSIVE METABOLIC PANEL - Abnormal; Notable for the following components:      Result Value   Glucose, Bld 123 (*)    Calcium 8.8 (*)    Anion gap 4 (*)    All other components within normal limits  CBC WITH DIFFERENTIAL/PLATELET - Abnormal; Notable for the following components:   WBC 3.9 (*)    Hemoglobin 11.5 (*)    All other components within normal limits  URINALYSIS, ROUTINE W REFLEX MICROSCOPIC - Abnormal; Notable for the following components:   APPearance HAZY (*)    All other components within normal limits  LIPASE, BLOOD    EKG None  Radiology Ct Abdomen Pelvis W Contrast  Result Date: 03/14/2018 CLINICAL DATA:  Right flank pain for 1 week. EXAM: CT ABDOMEN AND PELVIS WITH CONTRAST TECHNIQUE: Multidetector CT imaging of the abdomen and pelvis was performed using the standard protocol following bolus administration of intravenous contrast. CONTRAST:  100 mL ISOVUE-300 IOPAMIDOL (ISOVUE-300) INJECTION 61% COMPARISON:  CT chest, abdomen and pelvis 10/09/2017. FINDINGS: Lower chest: Scar in the periphery of the right lung base is unchanged. Mild dependent atelectasis noted. No pleural or pericardial effusion. Hepatobiliary: No focal liver abnormality is seen. No gallstones, gallbladder wall thickening, or biliary dilatation. Pancreas: Unremarkable. No pancreatic ductal dilatation or surrounding inflammatory changes. Pancreatic atrophy is unchanged. Spleen: Normal in size without focal abnormality. Adrenals/Urinary Tract: Adrenal glands are unremarkable. Kidneys are normal, without renal calculi, focal lesion, or hydronephrosis. Bladder is unremarkable. Stomach/Bowel: Postoperative change of distal gastrectomy and gastrojejunostomy are again seen. Colon and appendix appear normal. Vascular/Lymphatic: No significant vascular findings are present. No enlarged abdominal or pelvic lymph nodes. Reproductive: Status post hysterectomy. No  adnexal masses. Other: None. Musculoskeletal: Negative. IMPRESSION: No acute abnormality or finding to explain the patient's symptoms. Status post partial gastrectomy and gastrojejunostomy. Negative for metastatic disease. Electronically Signed   By: Inge Rise M.D.   On: 03/14/2018 11:54    Procedures Procedures (including critical care time)  Medications Ordered in ED Medications  sodium chloride 0.9 % bolus 1,000 mL (1,000 mLs Intravenous New Bag/Given 03/14/18 1102)  ondansetron (ZOFRAN) injection 4 mg (4 mg Intravenous Given 03/14/18 1102)  morphine 4 MG/ML injection 4 mg (4 mg Intravenous Given 03/14/18 1102)  iopamidol (ISOVUE-300) 61 % injection 100 mL (100 mLs Intravenous Contrast Given 03/14/18 1125)     Initial Impression / Assessment and Plan / ED Course  I have reviewed the triage vital signs and the nursing notes.  Pertinent labs & imaging results that were available during my care of the patient were reviewed by me and considered in my medical decision making (see chart for details).  53 year old female presents for 1 month of right flank pain.  She has no associated urinary symptoms.  On arrival normal vitals and patient is overall well-appearing.  She has some associated nausea and vomiting over the past 2 days but this resolved on its own.  Has had associated epigastric pain with this.  On abdominal exam she has some very mild epigastric tenderness, no other anterior abdominal tenderness, no concern for acute surgical abdomen patient does have tenderness over the right flank as well as the lower back.  Differential includes pyelonephritis, renal mass, kidney stone although I feel this is less likely given the description of the patient's pain constant dull ache and typical renal colic.  Patient could also have musculoskeletal back pain.  Will get labs and CT abdomen pelvis with contrast.  Labs overall reassuring, no leukocytosis and stable hemoglobin, no acute electrolyte  derangements, normal renal and liver function, normal lipase, urinalysis without signs of infection and no hematuria to suggest kidney stone.  CT shows no evidence of renal stone or mass and no obstructive uropathy, no signs of pyelonephritis, postsurgical changes from gastrectomy noted but no other acute findings within the abdomen or pelvis.  No bony lesions of the lumbar spine noted.  Suspect pain is likely musculoskeletal and nausea vomiting and epigastric pain the patient had another 2 days may be unrelated, but reassured that this has improved and patient has no abnormalities on labs.  Pain improved with treatment here in the emergency department, will discharge home with Zofran as needed for nausea and Robaxin to help with pain in addition to patient's home pain medications.  I have discussed return precautions and encourage patient to follow-up with her PCP.  Patient expresses understanding and agreement with this plan.  Discharged home  in good condition.  Final Clinical Impressions(s) / ED Diagnoses   Final diagnoses:  Right flank pain  Acute right-sided low back pain without sciatica  Non-intractable vomiting with nausea, unspecified vomiting type    ED Discharge Orders         Ordered    ondansetron (ZOFRAN ODT) 4 MG disintegrating tablet     03/14/18 1254    methocarbamol (ROBAXIN) 500 MG tablet  2 times daily     03/14/18 1254           Jacqlyn Larsen, [place] 03/16/18 1855    Rex Kras Wenda Overland, MD 03/17/18 1601

## 2018-03-14 NOTE — ED Triage Notes (Signed)
Pt c/o RT flank pain x 1 wk; NV

## 2018-03-19 ENCOUNTER — Other Ambulatory Visit: Payer: Self-pay | Admitting: *Deleted

## 2018-03-19 DIAGNOSIS — C163 Malignant neoplasm of pyloric antrum: Secondary | ICD-10-CM

## 2018-03-19 DIAGNOSIS — C169 Malignant neoplasm of stomach, unspecified: Secondary | ICD-10-CM

## 2018-03-19 MED ORDER — HYDROCODONE-ACETAMINOPHEN 5-325 MG PO TABS: 1.0000 | ORAL_TABLET | Freq: Four times a day (QID) | ORAL | 0 refills | Status: DC | PRN

## 2018-03-21 ENCOUNTER — Telehealth: Payer: Self-pay | Admitting: Hematology & Oncology

## 2018-03-21 NOTE — Telephone Encounter (Signed)
Faxed medical records to [facility]  F: [phone]  Kasidy Deerfield August 27, 1965 CASE: [id number]

## 2018-04-04 ENCOUNTER — Inpatient Hospital Stay: Payer: Medicare Other

## 2018-04-04 ENCOUNTER — Other Ambulatory Visit: Payer: Self-pay

## 2018-04-04 ENCOUNTER — Inpatient Hospital Stay: Payer: Medicare Other | Attending: Hematology & Oncology | Admitting: Family

## 2018-04-04 VITALS — BP 103/65 | HR 56 | Temp 98.5°F | Resp 16 | Ht 61.0 in | Wt 153.1 lb

## 2018-04-04 DIAGNOSIS — C169 Malignant neoplasm of stomach, unspecified: Secondary | ICD-10-CM

## 2018-04-04 DIAGNOSIS — Z85028 Personal history of other malignant neoplasm of stomach: Secondary | ICD-10-CM | POA: Insufficient documentation

## 2018-04-04 DIAGNOSIS — K909 Intestinal malabsorption, unspecified: Secondary | ICD-10-CM

## 2018-04-04 DIAGNOSIS — D500 Iron deficiency anemia secondary to blood loss (chronic): Secondary | ICD-10-CM

## 2018-04-04 DIAGNOSIS — D509 Iron deficiency anemia, unspecified: Secondary | ICD-10-CM | POA: Insufficient documentation

## 2018-04-04 DIAGNOSIS — C163 Malignant neoplasm of pyloric antrum: Secondary | ICD-10-CM

## 2018-04-04 LAB — CMP (CANCER CENTER ONLY)
ALT: 13 U/L (ref 0–44)
AST: 16 U/L (ref 15–41)
Albumin: 4 g/dL (ref 3.5–5.0)
Alkaline Phosphatase: 75 U/L (ref 38–126)
Anion gap: 6 (ref 5–15)
BUN: 9 mg/dL (ref 6–20)
CO2: 29 mmol/L (ref 22–32)
Calcium: 9.2 mg/dL (ref 8.9–10.3)
Chloride: 104 mmol/L (ref 98–111)
Creatinine: 0.86 mg/dL (ref 0.44–1.00)
GFR, Est AFR Am: >60 mL/min (ref >60)
GFR, Estimated: >60 mL/min (ref >60)
Glucose, Bld: 109 mg/dL — ABNORMAL HIGH (ref 70–99)
Potassium: 4.1 mmol/L (ref 3.5–5.1)
Sodium: 139 mmol/L (ref 135–145)
Total Bilirubin: 0.5 mg/dL (ref 0.3–1.2)
Total Protein: 6.3 g/dL — ABNORMAL LOW (ref 6.5–8.1)

## 2018-04-04 LAB — CBC WITH DIFFERENTIAL (CANCER CENTER ONLY)
Abs Immature Granulocytes: 0.01 10*3/uL (ref 0.00–0.07)
Basophils Absolute: 0 10*3/uL (ref 0.0–0.1)
Basophils Relative: 1 %
Eosinophils Absolute: 0.1 10*3/uL (ref 0.0–0.5)
Eosinophils Relative: 3 %
HCT: 36.5 % (ref 36.0–46.0)
HEMOGLOBIN: 11.4 g/dL — AB (ref 12.0–15.0)
Immature Granulocytes: 0 %
LYMPHS PCT: 32 %
Lymphs Abs: 1.5 10*3/uL (ref 0.7–4.0)
MCH: 28.6 pg (ref 26.0–34.0)
MCHC: 31.2 g/dL (ref 30.0–36.0)
MCV: 91.5 fL (ref 80.0–100.0)
Monocytes Absolute: 0.4 10*3/uL (ref 0.1–1.0)
Monocytes Relative: 9 %
NEUTROS PCT: 55 %
Neutro Abs: 2.6 10*3/uL (ref 1.7–7.7)
Platelet Count: 255 10*3/uL (ref 150–400)
RBC: 3.99 MIL/uL (ref 3.87–5.11)
RDW: 12.8 % (ref 11.5–15.5)
WBC Count: 4.6 10*3/uL (ref 4.0–10.5)
nRBC: 0 % (ref 0.0–0.2)

## 2018-04-04 NOTE — Patient Instructions (Signed)

## 2018-04-04 NOTE — Progress Notes (Signed)
Hematology and Oncology Follow Up Visit  TABBETHA [name] [id number] 10-Jul-1965 53 y.o. 04/04/2018   Principle Diagnosis:  Stage IIA (T1a, N2, M0) gastric adenocarcinoma diagnosed in July of 2015 Intermittent iron deficiency anemia  Past Therapy: Status post diagnostic laparoscopy, distal gastrectomy with billroth II anastamosis and feeding gastrojejunostomy tube under the care of Dr. Barry Dienes on 09/20/2013 Adjuvant systemic chemotherapy with 5-FU 600 mg/M2 with leucovorin on days 1, 8, 15, 22, 29 and 36 every 8 weeks Adjuvant concurrent chemoradiation with Xeloda 650 mg/M2 by mouth twice a day for 5 days every week with radiation. The patient completed the course of radiotherapy but she declined to take Xeloda during her radiation  Current Therapy:   Observation IV iron as indicated   Interim History:  [name] is here today for follow-up. She is having some mild fatigue.  She had an endoscopy with Dr. Denice Paradise which showed a single sessile polyp near her anastomotic site. The biopsy showed possible hyperplastic changes. They started her on Carafate. If the poly is still present on follow-up EGD they will remove. The patient is unsure of her follow-up appointment date.  She states that the Carafate makes her feel ill. She has not been taking this. She has smoked a little cannabis which has helped resolve any nausea or vomiting.  She has not noted any episodes of bleeding. No bruising or petechiae.  No fever, chills, cough, rash, SOB, chest pain, abdominal pain or changes in bowel or bladder habits.  She states that she will occasionally have dizziness and palpitations with over exertion.  No swelling, tenderness, numbness or tingling in her extremities.  No lymphadenopathy noted on exam.  Her appetite is ok. She is staying hydrated and her weight is stable.   ECOG Performance Status: 1 - Symptomatic but completely ambulatory  Medications:  Allergies as of 04/04/2018    Reactions   Tape Itching   Ok with adhesive tape, ok with paper tape per patient      Medication List       Accurate as of April 04, 2018 12:21 PM. Always use your most recent med list.        acetaminophen 500 MG tablet Commonly known as:  TYLENOL Take 2 tablets (1,000 mg total) by mouth every 6 (six) hours as needed for mild pain or moderate pain.   amoxicillin 500 MG capsule Commonly known as:  AMOXIL Take 1 capsule (500 mg total) by mouth 3 (three) times daily.   carvedilol 6.25 MG tablet Commonly known as:  COREG Take 6.25 mg by mouth 2 (two) times daily.   diclofenac sodium 1 % Gel Commonly known as:  VOLTAREN Apply 2-4 g topically 2 (two) times daily as needed for pain.   fluticasone 50 MCG/ACT nasal spray Commonly known as:  FLONASE   HYDROcodone-acetaminophen 5-325 MG tablet Commonly known as:  NORCO/VICODIN Take 1 tablet by mouth every 6 (six) hours as needed for moderate pain.   lidocaine-prilocaine cream Commonly known as:  EMLA Apply 1 application topically as needed.   methocarbamol 500 MG tablet Commonly known as:  ROBAXIN Take 1 tablet (500 mg total) by mouth 2 (two) times daily.   omeprazole 20 MG capsule Commonly known as:  PRILOSEC 20 mg 3 (three) times daily as needed.   ondansetron 4 MG disintegrating tablet Commonly known as:  ZOFRAN ODT 4mg  ODT q4 hours prn nausea/vomit   promethazine 25 MG tablet Commonly known as:  PHENERGAN Take 25 mg by mouth every 6 (  six) hours as needed for nausea or vomiting.   XARELTO 10 MG Tabs tablet Generic drug:  rivaroxaban TAKE 1 TABLET(10 MG) BY MOUTH DAILY       Allergies:  Allergies  Allergen Reactions  . Tape Itching    Ok with adhesive tape, ok with paper tape per patient    Past Medical History, Surgical history, Social history, and Family History were reviewed and updated.  Review of Systems: All other 10 point review of systems is negative.   Physical Exam:  vitals were not  taken for this visit.   Wt Readings from Last 3 Encounters:  03/14/18 160 lb (72.6 kg)  01/03/18 157 lb 8 oz (71.4 kg)  12/15/17 156 lb 8 oz (71 kg)    Ocular: Sclerae unicteric, pupils equal, round and reactive to light Ear-nose-throat: Oropharynx clear, dentition fair Lymphatic: No cervical, supraclavicular or axillary adenopathy Lungs no rales or rhonchi, good excursion bilaterally Heart regular rate and rhythm, no murmur appreciated Abd soft, nontender, positive bowel sounds, no liver or spleen tip palpated on exam, no fluid wave  MSK no focal spinal tenderness, no joint edema Neuro: non-focal, well-oriented, appropriate affect Breasts: Deferred   Lab Results  Component Value Date   WBC 4.6 04/04/2018   HGB 11.4 (L) 04/04/2018   HCT 36.5 04/04/2018   MCV 91.5 04/04/2018   PLT 255 04/04/2018   Lab Results  Component Value Date   FERRITIN 288 01/03/2018   IRON 111 01/03/2018   TIBC 221 (L) 01/03/2018   UIBC 111 (L) 01/03/2018   IRONPCTSAT 50 01/03/2018   Lab Results  Component Value Date   RBC 3.99 04/04/2018   No results found for: Nils Pyle [facility] Lab Results  Component Value Date   IGGSERUM 1,273 05/21/2015   IGMSERUM 40 05/21/2015   No results found for: Odetta Pink, SPEI   Chemistry      Component Value Date/Time   NA 139 04/04/2018 1130   NA 140 01/26/2017 1002   NA 140 09/24/2015 0953   K 4.1 04/04/2018 1130   K 3.5 01/26/2017 1002   K 4.0 09/24/2015 0953   CL 104 04/04/2018 1130   CL 106 01/26/2017 1002   CO2 29 04/04/2018 1130   CO2 26 01/26/2017 1002   CO2 23 09/24/2015 0953   BUN 9 04/04/2018 1130   BUN 6 (L) 01/26/2017 1002   BUN 8.6 09/24/2015 0953   CREATININE 0.86 04/04/2018 1130   CREATININE 1.1 01/26/2017 1002   CREATININE 1.0 09/24/2015 0953      Component Value Date/Time   CALCIUM 9.2 04/04/2018 1130   CALCIUM 8.9 01/26/2017 1002   CALCIUM 9.6  09/24/2015 0953   ALKPHOS 75 04/04/2018 1130   ALKPHOS 68 01/26/2017 1002   ALKPHOS 99 09/24/2015 0953   AST 16 04/04/2018 1130   AST 17 09/24/2015 0953   ALT 13 04/04/2018 1130   ALT 17 01/26/2017 1002   ALT 11 09/24/2015 0953   BILITOT 0.5 04/04/2018 1130   BILITOT 0.83 09/24/2015 0953       Impression and Plan: [name] is a pleasant 53 yo African American female with history of stage II gastric cancer and partial gastrectomy followed by adjuvant chemo and radiation.  She has followed up with GI and had an endoscopy as mentioned above. She is unsure as to when she will have the repeat EGD.  We will see what her iron studies show and bring her back in  for infusion if needed.  We will go ahead and plan to see her back in another 3 months for follow-up.  She will contact our office with any questions or concerns. We can certainly see her sooner if need be.   Laverna Peace, NP 2/12/202012:21 PM

## 2018-04-05 LAB — IRON AND TIBC
Iron: 82 ug/dL (ref 41–142)
Saturation Ratios: 39 % (ref 21–57)
TIBC: 211 ug/dL — ABNORMAL LOW (ref 236–444)
UIBC: 130 ug/dL (ref 120–384)

## 2018-04-05 LAB — FERRITIN: Ferritin: 189 ng/mL (ref 11–307)

## 2018-04-10 ENCOUNTER — Encounter: Payer: Self-pay | Admitting: *Deleted

## 2018-04-18 ENCOUNTER — Other Ambulatory Visit: Payer: Self-pay | Admitting: *Deleted

## 2018-04-18 DIAGNOSIS — C169 Malignant neoplasm of stomach, unspecified: Secondary | ICD-10-CM

## 2018-04-18 DIAGNOSIS — C163 Malignant neoplasm of pyloric antrum: Secondary | ICD-10-CM

## 2018-04-18 MED ORDER — HYDROCODONE-ACETAMINOPHEN 5-325 MG PO TABS: 1.0000 | ORAL_TABLET | Freq: Four times a day (QID) | ORAL | 0 refills | Status: DC | PRN

## 2018-05-09 ENCOUNTER — Telehealth: Payer: Self-pay | Admitting: *Deleted

## 2018-05-09 ENCOUNTER — Other Ambulatory Visit: Payer: Self-pay | Admitting: *Deleted

## 2018-05-09 MED ORDER — PROMETHAZINE HCL 25 MG PO TABS: 25.0000 mg | ORAL_TABLET | Freq: Four times a day (QID) | ORAL | 1 refills | Status: DC | PRN

## 2018-05-09 NOTE — Telephone Encounter (Signed)
Message received from patient on refill line for refill of Phenergan.  OK for refill of Phenergan per S. [place] NP.  Prescription sent.

## 2018-05-10 ENCOUNTER — Telehealth: Payer: Self-pay | Admitting: Family

## 2018-05-10 NOTE — Telephone Encounter (Signed)
Called and pre-screened patient NO symptoms-no travels

## 2018-05-11 ENCOUNTER — Encounter: Payer: Self-pay | Admitting: Family

## 2018-05-11 ENCOUNTER — Inpatient Hospital Stay: Payer: Medicare Other | Attending: Hematology & Oncology | Admitting: Family

## 2018-05-11 ENCOUNTER — Inpatient Hospital Stay: Payer: Medicare Other

## 2018-05-11 ENCOUNTER — Other Ambulatory Visit: Payer: Self-pay

## 2018-05-11 VITALS — BP 115/79 | HR 71 | Temp 98.1°F | Resp 16 | Wt 149.0 lb

## 2018-05-11 DIAGNOSIS — K909 Intestinal malabsorption, unspecified: Secondary | ICD-10-CM

## 2018-05-11 DIAGNOSIS — K219 Gastro-esophageal reflux disease without esophagitis: Secondary | ICD-10-CM

## 2018-05-11 DIAGNOSIS — D500 Iron deficiency anemia secondary to blood loss (chronic): Secondary | ICD-10-CM

## 2018-05-11 DIAGNOSIS — Z85028 Personal history of other malignant neoplasm of stomach: Secondary | ICD-10-CM | POA: Insufficient documentation

## 2018-05-11 DIAGNOSIS — C169 Malignant neoplasm of stomach, unspecified: Secondary | ICD-10-CM

## 2018-05-11 DIAGNOSIS — D509 Iron deficiency anemia, unspecified: Secondary | ICD-10-CM | POA: Insufficient documentation

## 2018-05-11 LAB — CBC WITH DIFFERENTIAL (CANCER CENTER ONLY)
Abs Immature Granulocytes: 0.02 10*3/uL (ref 0.00–0.07)
Basophils Absolute: 0 10*3/uL (ref 0.0–0.1)
Basophils Relative: 0 %
Eosinophils Absolute: 0 10*3/uL (ref 0.0–0.5)
Eosinophils Relative: 1 %
HCT: 39 % (ref 36.0–46.0)
Hemoglobin: 12 g/dL (ref 12.0–15.0)
Immature Granulocytes: 0 %
Lymphocytes Relative: 21 %
Lymphs Abs: 1.5 10*3/uL (ref 0.7–4.0)
MCH: 28.6 pg (ref 26.0–34.0)
MCHC: 30.8 g/dL (ref 30.0–36.0)
MCV: 93.1 fL (ref 80.0–100.0)
Monocytes Absolute: 0.7 10*3/uL (ref 0.1–1.0)
Monocytes Relative: 9 %
Neutro Abs: 4.9 10*3/uL (ref 1.7–7.7)
Neutrophils Relative %: 69 %
PLATELETS: 196 10*3/uL (ref 150–400)
RBC: 4.19 MIL/uL (ref 3.87–5.11)
RDW: 14.1 % (ref 11.5–15.5)
WBC Count: 7.2 10*3/uL (ref 4.0–10.5)
nRBC: 0 % (ref 0.0–0.2)

## 2018-05-11 LAB — CMP (CANCER CENTER ONLY)
ALT: 12 U/L (ref 0–44)
ANION GAP: 7 (ref 5–15)
AST: 14 U/L — ABNORMAL LOW (ref 15–41)
Albumin: 4.2 g/dL (ref 3.5–5.0)
Alkaline Phosphatase: 79 U/L (ref 38–126)
BUN: 13 mg/dL (ref 6–20)
CHLORIDE: 102 mmol/L (ref 98–111)
CO2: 30 mmol/L (ref 22–32)
Calcium: 8.6 mg/dL — ABNORMAL LOW (ref 8.9–10.3)
Creatinine: 0.95 mg/dL (ref 0.44–1.00)
GFR, Est AFR Am: >60 mL/min (ref >60)
GFR, Estimated: >60 mL/min (ref >60)
Glucose, Bld: 109 mg/dL — ABNORMAL HIGH (ref 70–99)
POTASSIUM: 4.5 mmol/L (ref 3.5–5.1)
Sodium: 139 mmol/L (ref 135–145)
Total Bilirubin: 0.4 mg/dL (ref 0.3–1.2)
Total Protein: 6.5 g/dL (ref 6.5–8.1)

## 2018-05-11 NOTE — Progress Notes (Signed)
Hematology and Oncology Follow Up Visit  Paula [name] [id number] 10/12/1965 53 y.o. 05/11/2018   Principle Diagnosis:  Stage IIA (T1a, N2, M0) gastric adenocarcinoma diagnosed in July of 2015 Intermittent iron deficiency anemia  Past Therapy: Status post diagnostic laparoscopy, distal gastrectomy with billroth II anastamosis and feeding gastrojejunostomy tube under the care of Dr. Barry Dienes on 09/20/2013 Adjuvant systemic chemotherapy with 5-FU 600 mg/M2 with leucovorin on days 1, 8, 15, 22, 29 and 36 every 8 weeks Adjuvant concurrent chemoradiation with Xeloda 650 mg/M2 by mouth twice a day for 5 days every week with radiation. The patient completed the course of radiotherapy but she declined to take Xeloda during her radiation  Current Therapy:   Observation IV iron as indicated   Interim History:  [name] is ere today for follow-up. She states that she had some pain in the left breast recently and noted some lumps in the left outer breast. This resolved after a day or two. Bilateral breast exam today was negative. No mass, lesion or rash noted.  No lymphadenopathy noted on exam.  Mammogram in September was negative.  She has also been having n/v and increased GERD. She had an appointment with GI but this has been cancelled due to Covid-19. She will reschedule when appropriate.  She had a CT of the abdomen and pelvis in late January which was negative.  She will try adding Pepcid BID along with her Prilosec regimen.  She denies fever, chills, cough, rash, dizziness, SOB, chest pain, palpitations, abdominal pain or changes in bowel or bladder habits.  No swelling, tenderness, numbness or tingling in her extremities.  No episodes of bleeding, no bruising or petechiae.  No falls or syncopal episodes to report.  She is eating well and is staying hydrated. Her weight is stable.   ECOG Performance Status: 1 - Symptomatic but completely ambulatory  Medications:   Allergies as of 05/11/2018      Reactions   Tape Itching   Ok with adhesive tape, ok with paper tape per patient      Medication List       Accurate as of May 11, 2018  3:01 PM. Always use your most recent med list.        carvedilol 6.25 MG tablet Commonly known as:  COREG Take 6.25 mg by mouth 2 (two) times daily.   fluticasone 50 MCG/ACT nasal spray Commonly known as:  FLONASE   HYDROcodone-acetaminophen 5-325 MG tablet Commonly known as:  NORCO/VICODIN Take 1 tablet by mouth every 6 (six) hours as needed for moderate pain.   lidocaine-prilocaine cream Commonly known as:  EMLA Apply 1 application topically as needed.   omeprazole 20 MG capsule Commonly known as:  PRILOSEC 20 mg 3 (three) times daily as needed.   ondansetron 4 MG disintegrating tablet Commonly known as:  Zofran ODT 4mg  ODT q4 hours prn nausea/vomit   promethazine 25 MG tablet Commonly known as:  PHENERGAN Take 1 tablet (25 mg total) by mouth every 6 (six) hours as needed for nausea or vomiting.       Allergies:  Allergies  Allergen Reactions  . Tape Itching    Ok with adhesive tape, ok with paper tape per patient    Past Medical History, Surgical history, Social history, and Family History were reviewed and updated.  Review of Systems: All other 10 point review of systems is negative.   Physical Exam:  vitals were not taken for this visit.   Wt Readings from Last  3 Encounters:  04/04/18 153 lb 1.9 oz (69.5 kg)  03/14/18 160 lb (72.6 kg)  01/03/18 157 lb 8 oz (71.4 kg)    Ocular: Sclerae unicteric, pupils equal, round and reactive to light Ear-nose-throat: Oropharynx clear, dentition fair Lymphatic: No cervical, supraclavicular or axillary adenopathy Lungs no rales or rhonchi, good excursion bilaterally Heart regular rate and rhythm, no murmur appreciated Abd soft, nontender, positive bowel sounds, no liver or spleen tip palpated on exam, no fluid wave  MSK no focal spinal  tenderness, no joint edema Neuro: non-focal, well-oriented, appropriate affect Breasts: Deferred   Lab Results  Component Value Date   WBC 4.6 04/04/2018   HGB 11.4 (L) 04/04/2018   HCT 36.5 04/04/2018   MCV 91.5 04/04/2018   PLT 255 04/04/2018   Lab Results  Component Value Date   FERRITIN 189 04/04/2018   IRON 82 04/04/2018   TIBC 211 (L) 04/04/2018   UIBC 130 04/04/2018   IRONPCTSAT 39 04/04/2018   Lab Results  Component Value Date   RBC 3.99 04/04/2018   No results found for: Nils Pyle [facility] Lab Results  Component Value Date   IGGSERUM 1,273 05/21/2015   IGMSERUM 40 05/21/2015   No results found for: Odetta Pink, SPEI   Chemistry      Component Value Date/Time   NA 139 04/04/2018 1130   NA 140 01/26/2017 1002   NA 140 09/24/2015 0953   K 4.1 04/04/2018 1130   K 3.5 01/26/2017 1002   K 4.0 09/24/2015 0953   CL 104 04/04/2018 1130   CL 106 01/26/2017 1002   CO2 29 04/04/2018 1130   CO2 26 01/26/2017 1002   CO2 23 09/24/2015 0953   BUN 9 04/04/2018 1130   BUN 6 (L) 01/26/2017 1002   BUN 8.6 09/24/2015 0953   CREATININE 0.86 04/04/2018 1130   CREATININE 1.1 01/26/2017 1002   CREATININE 1.0 09/24/2015 0953      Component Value Date/Time   CALCIUM 9.2 04/04/2018 1130   CALCIUM 8.9 01/26/2017 1002   CALCIUM 9.6 09/24/2015 0953   ALKPHOS 75 04/04/2018 1130   ALKPHOS 68 01/26/2017 1002   ALKPHOS 99 09/24/2015 0953   AST 16 04/04/2018 1130   AST 17 09/24/2015 0953   ALT 13 04/04/2018 1130   ALT 17 01/26/2017 1002   ALT 11 09/24/2015 0953   BILITOT 0.5 04/04/2018 1130   BILITOT 0.83 09/24/2015 0953       Impression and Plan: [name] is a pleasant 53 yo African American female with history of stage II gastric cancer and partial gastrectomy followed by adjuvant chemo and radiation.  She is doing fairly well but having issues with increased GERD along with n/v. She will  follow-up with GI when possible. Until then she will try adding pepcid BID to her daily regimen.  We will see what her iron studies show and bring her back in for infusion if needed.  We will plan to see her back in another 4 months.  She will contact our office with any questions or concerns. We can certainly see her sooner if need be.   Laverna Peace, NP 3/20/20203:01 PM

## 2018-05-14 LAB — IRON AND TIBC
Iron: 100 ug/dL (ref 41–142)
Saturation Ratios: 39 % (ref 21–57)
TIBC: 255 ug/dL (ref 236–444)
UIBC: 155 ug/dL (ref 120–384)

## 2018-05-14 LAB — FERRITIN: Ferritin: 192 ng/mL (ref 11–307)

## 2018-05-15 ENCOUNTER — Telehealth: Payer: Self-pay | Admitting: Hematology & Oncology

## 2018-05-15 NOTE — Telephone Encounter (Signed)
Appointments scheduled letter/calendar mailed per 3/20 los

## 2018-05-17 ENCOUNTER — Other Ambulatory Visit: Payer: Self-pay | Admitting: *Deleted

## 2018-05-17 DIAGNOSIS — C163 Malignant neoplasm of pyloric antrum: Secondary | ICD-10-CM

## 2018-05-17 DIAGNOSIS — C169 Malignant neoplasm of stomach, unspecified: Secondary | ICD-10-CM

## 2018-05-17 MED ORDER — HYDROCODONE-ACETAMINOPHEN 5-325 MG PO TABS: 1.0000 | ORAL_TABLET | Freq: Four times a day (QID) | ORAL | 0 refills | Status: DC | PRN

## 2018-06-15 ENCOUNTER — Other Ambulatory Visit: Payer: Self-pay | Admitting: *Deleted

## 2018-06-15 DIAGNOSIS — C169 Malignant neoplasm of stomach, unspecified: Secondary | ICD-10-CM

## 2018-06-15 DIAGNOSIS — C163 Malignant neoplasm of pyloric antrum: Secondary | ICD-10-CM

## 2018-06-15 MED ORDER — HYDROCODONE-ACETAMINOPHEN 5-325 MG PO TABS: 1.0000 | ORAL_TABLET | Freq: Four times a day (QID) | ORAL | 0 refills | Status: DC | PRN

## 2018-06-27 ENCOUNTER — Telehealth: Payer: Self-pay | Admitting: Hematology & Oncology

## 2018-06-27 NOTE — Telephone Encounter (Signed)
Patient called to confirm her appts for 5/8

## 2018-06-29 ENCOUNTER — Other Ambulatory Visit: Payer: Self-pay

## 2018-06-29 ENCOUNTER — Inpatient Hospital Stay: Payer: Medicare Other | Attending: Hematology & Oncology | Admitting: Hematology & Oncology

## 2018-06-29 ENCOUNTER — Inpatient Hospital Stay: Payer: Medicare Other

## 2018-06-29 VITALS — BP 125/81 | HR 61 | Temp 98.3°F | Resp 19 | Wt 148.4 lb

## 2018-06-29 DIAGNOSIS — K219 Gastro-esophageal reflux disease without esophagitis: Secondary | ICD-10-CM | POA: Diagnosis not present

## 2018-06-29 DIAGNOSIS — Z452 Encounter for adjustment and management of vascular access device: Secondary | ICD-10-CM | POA: Diagnosis not present

## 2018-06-29 DIAGNOSIS — Z85028 Personal history of other malignant neoplasm of stomach: Secondary | ICD-10-CM | POA: Insufficient documentation

## 2018-06-29 DIAGNOSIS — D509 Iron deficiency anemia, unspecified: Secondary | ICD-10-CM | POA: Diagnosis present

## 2018-06-29 DIAGNOSIS — D500 Iron deficiency anemia secondary to blood loss (chronic): Secondary | ICD-10-CM

## 2018-06-29 DIAGNOSIS — C169 Malignant neoplasm of stomach, unspecified: Secondary | ICD-10-CM

## 2018-06-29 LAB — CMP (CANCER CENTER ONLY)
ALT: 10 U/L (ref 0–44)
AST: 13 U/L — ABNORMAL LOW (ref 15–41)
Albumin: 3.6 g/dL (ref 3.5–5.0)
Alkaline Phosphatase: 69 U/L (ref 38–126)
Anion gap: 4 — ABNORMAL LOW (ref 5–15)
BUN: 7 mg/dL (ref 6–20)
CO2: 29 mmol/L (ref 22–32)
Calcium: 8.7 mg/dL — ABNORMAL LOW (ref 8.9–10.3)
Chloride: 105 mmol/L (ref 98–111)
Creatinine: 0.67 mg/dL (ref 0.44–1.00)
GFR, Est AFR Am: >60 mL/min (ref >60)
GFR, Estimated: >60 mL/min (ref >60)
Glucose, Bld: 97 mg/dL (ref 70–99)
Potassium: 3.8 mmol/L (ref 3.5–5.1)
Sodium: 138 mmol/L (ref 135–145)
Total Bilirubin: 0.4 mg/dL (ref 0.3–1.2)
Total Protein: 5.9 g/dL — ABNORMAL LOW (ref 6.5–8.1)

## 2018-06-29 LAB — CBC WITH DIFFERENTIAL (CANCER CENTER ONLY)
Abs Immature Granulocytes: 0.01 10*3/uL (ref 0.00–0.07)
Basophils Absolute: 0 10*3/uL (ref 0.0–0.1)
Basophils Relative: 1 %
Eosinophils Absolute: 0.1 10*3/uL (ref 0.0–0.5)
Eosinophils Relative: 3 %
HCT: 32.3 % — ABNORMAL LOW (ref 36.0–46.0)
Hemoglobin: 10.3 g/dL — ABNORMAL LOW (ref 12.0–15.0)
Immature Granulocytes: 0 %
Lymphocytes Relative: 30 %
Lymphs Abs: 1.3 10*3/uL (ref 0.7–4.0)
MCH: 29.3 pg (ref 26.0–34.0)
MCHC: 31.9 g/dL (ref 30.0–36.0)
MCV: 92 fL (ref 80.0–100.0)
Monocytes Absolute: 0.5 10*3/uL (ref 0.1–1.0)
Monocytes Relative: 11 %
Neutro Abs: 2.4 10*3/uL (ref 1.7–7.7)
Neutrophils Relative %: 55 %
Platelet Count: 244 10*3/uL (ref 150–400)
RBC: 3.51 MIL/uL — ABNORMAL LOW (ref 3.87–5.11)
RDW: 14.6 % (ref 11.5–15.5)
WBC Count: 4.3 10*3/uL (ref 4.0–10.5)
nRBC: 0 % (ref 0.0–0.2)

## 2018-06-29 MED ORDER — STERILE WATER FOR INJECTION IJ SOLN
INTRAMUSCULAR | Status: AC
  Filled 2018-06-29: qty 10

## 2018-06-29 MED ORDER — ALTEPLASE 2 MG IJ SOLR
INTRAMUSCULAR | Status: AC
  Filled 2018-06-29: qty 2

## 2018-06-29 MED ORDER — HEPARIN SOD (PORK) LOCK FLUSH 100 UNIT/ML IV SOLN
500.0000 [IU] | Freq: Once | INTRAVENOUS | Status: AC
  Administered 2018-06-29: 12:00:00 500 [IU] via INTRAVENOUS
  Filled 2018-06-29: qty 5

## 2018-06-29 MED ORDER — SODIUM CHLORIDE 0.9% FLUSH
10.0000 mL | INTRAVENOUS | Status: DC | PRN
  Administered 2018-06-29: 12:00:00 10 mL via INTRAVENOUS
  Filled 2018-06-29: qty 10

## 2018-06-29 MED ORDER — ALTEPLASE 2 MG IJ SOLR
2.0000 mg | Freq: Once | INTRAMUSCULAR | Status: AC
  Administered 2018-06-29: 2 mg
  Filled 2018-06-29: qty 2

## 2018-06-29 MED ORDER — DEXLANSOPRAZOLE 60 MG PO CPDR: 60.0000 mg | DELAYED_RELEASE_CAPSULE | Freq: Every day | ORAL | 4 refills | Status: DC

## 2018-06-29 MED FILL — DEXILANT DR 60 MG CAPSULE: 60 | 30 days supply | Qty: 30 | Fill #0

## 2018-06-29 NOTE — Patient Instructions (Signed)

## 2018-06-29 NOTE — Progress Notes (Signed)
Hematology and Oncology Follow Up Visit  Paula [name] [id number] 1966/01/19 53 y.o. 06/29/2018   Principle Diagnosis:  Stage IIA (T1a, N2, M0) gastric adenocarcinoma diagnosed in July of 2015 Intermittent iron deficiency anemia  Past Therapy: Status post diagnostic laparoscopy, distal gastrectomy with billroth II anastamosis and feeding gastrojejunostomy tube under the care of Dr. Barry Dienes on 09/20/2013 Adjuvant systemic chemotherapy with 5-FU 600 mg/M2 with leucovorin on days 1, 8, 15, 22, 29 and 36 every 8 weeks Adjuvant concurrent chemoradiation with Xeloda 650 mg/M2 by mouth twice a day for 5 days every week with radiation. The patient completed the course of radiotherapy but she declined to take Xeloda during her radiation  Current Therapy:   Observation IV iron as indicated   Interim History:  Paula [name] is ere today for follow-up.  She is having more problems.  Is out of the problem that she is having might be with reflux.  She does have a gastroenterologist in [place], [place].  I think that she says she had a upper endoscopy just this year.  A polyp was found.  I am unsure if this really is meaningful.  She says she takes 6 Prilosec a day.  This is way too much to be taking.  We will switch her over to Dexilant (60 mg p.o. daily) and see if this helps.  She has a follow-up appointment with her gastroenterologist in June.  She also is having some lower back discomfort.  She sees an orthopedist in [facility], Dr. Payton Spark.  She will make an appointment to see him.  She has had injections into her right hip for bursitis.  Her last iron studies done back in March showed a ferritin of 192 with an iron saturation of 39%.  Overall, her performance status is ECOG 1.  Medications:  Allergies as of 06/29/2018      Reactions   Tape Itching   Ok with adhesive tape, ok with paper tape per patient      Medication List       Accurate as of Jun 29, 2018 12:36  PM. If you have any questions, ask your nurse or doctor.        carvedilol 6.25 MG tablet Commonly known as:  COREG Take 6.25 mg by mouth 2 (two) times daily.   fluticasone 50 MCG/ACT nasal spray Commonly known as:  FLONASE   HYDROcodone-acetaminophen 5-325 MG tablet Commonly known as:  NORCO/VICODIN Take 1 tablet by mouth every 6 (six) hours as needed for moderate pain.   lidocaine-prilocaine cream Commonly known as:  EMLA Apply 1 application topically as needed.   omeprazole 20 MG capsule Commonly known as:  PRILOSEC 20 mg 3 (three) times daily as needed.   promethazine 25 MG tablet Commonly known as:  PHENERGAN Take 1 tablet (25 mg total) by mouth every 6 (six) hours as needed for nausea or vomiting.       Allergies:  Allergies  Allergen Reactions  . Tape Itching    Ok with adhesive tape, ok with paper tape per patient    Past Medical History, Surgical history, Social history, and Family History were reviewed and updated.  Review of Systems: Review of Systems  Constitutional: Negative.   HENT: Negative.   Eyes: Negative.   Respiratory: Negative.   Cardiovascular: Negative.   Gastrointestinal: Positive for heartburn, nausea and vomiting.  Genitourinary: Negative.   Musculoskeletal: Negative.   Skin: Negative.   Neurological: Negative.   Endo/Heme/Allergies: Negative.   Psychiatric/Behavioral: Negative.  Physical Exam:  weight is 148 lb 6.4 oz (67.3 kg). Her oral temperature is 98.3 F (36.8 C). Her blood pressure is 125/81 and her pulse is 61. Her respiration is 19 and oxygen saturation is 100%.   Wt Readings from Last 3 Encounters:  06/29/18 148 lb 6.4 oz (67.3 kg)  05/11/18 149 lb (67.6 kg)  04/04/18 153 lb 1.9 oz (69.5 kg)    Physical Exam Vitals signs reviewed.  HENT:     Head: Normocephalic and atraumatic.  Eyes:     Pupils: Pupils are equal, round, and reactive to light.  Neck:     Musculoskeletal: Normal range of motion.   Cardiovascular:     Rate and Rhythm: Normal rate and regular rhythm.     Heart sounds: Normal heart sounds.  Pulmonary:     Effort: Pulmonary effort is normal.     Breath sounds: Normal breath sounds.  Abdominal:     General: Bowel sounds are normal.     Palpations: Abdomen is soft.     Comments: Soft.  She has probably scars are well-healed.  There is no distention.  Bowel sounds are present.  There is no abdominal mass.  There is no palpable liver or spleen tip.  Musculoskeletal: Normal range of motion.        General: No tenderness or deformity.     Comments: Back exam shows no tenderness over the spine, ribs or hips.  No paravertebral muscle spasms are noted.  Lymphadenopathy:     Cervical: No cervical adenopathy.  Skin:    General: Skin is warm and dry.     Findings: No erythema or rash.  Neurological:     Mental Status: She is alert and oriented to person, place, and time.  Psychiatric:        Behavior: Behavior normal.        Thought Content: Thought content normal.        Judgment: Judgment normal.      Lab Results  Component Value Date   WBC 4.3 06/29/2018   HGB 10.3 (L) 06/29/2018   HCT 32.3 (L) 06/29/2018   MCV 92.0 06/29/2018   PLT 244 06/29/2018   Lab Results  Component Value Date   FERRITIN 192 05/11/2018   IRON 100 05/11/2018   TIBC 255 05/11/2018   UIBC 155 05/11/2018   IRONPCTSAT 39 05/11/2018   Lab Results  Component Value Date   RBC 3.51 (L) 06/29/2018   No results found for: Nils Pyle [facility] Lab Results  Component Value Date   IGGSERUM 1,273 05/21/2015   IGMSERUM 40 05/21/2015   No results found for: Odetta Pink, SPEI   Chemistry      Component Value Date/Time   NA 139 05/11/2018 1515   NA 140 01/26/2017 1002   NA 140 09/24/2015 0953   K 4.5 05/11/2018 1515   K 3.5 01/26/2017 1002   K 4.0 09/24/2015 0953   CL 102 05/11/2018 1515   CL 106 01/26/2017 1002    CO2 30 05/11/2018 1515   CO2 26 01/26/2017 1002   CO2 23 09/24/2015 0953   BUN 13 05/11/2018 1515   BUN 6 (L) 01/26/2017 1002   BUN 8.6 09/24/2015 0953   CREATININE 0.95 05/11/2018 1515   CREATININE 1.1 01/26/2017 1002   CREATININE 1.0 09/24/2015 0953      Component Value Date/Time   CALCIUM 8.6 (L) 05/11/2018 1515   CALCIUM 8.9 01/26/2017 1002   CALCIUM  9.6 09/24/2015 0953   ALKPHOS 79 05/11/2018 1515   ALKPHOS 68 01/26/2017 1002   ALKPHOS 99 09/24/2015 0953   AST 14 (L) 05/11/2018 1515   AST 17 09/24/2015 0953   ALT 12 05/11/2018 1515   ALT 17 01/26/2017 1002   ALT 11 09/24/2015 0953   BILITOT 0.4 05/11/2018 1515   BILITOT 0.83 09/24/2015 0953       Impression and Plan: Paula [name] is a pleasant 53 yo African American female with history of stage II gastric cancer and partial gastrectomy followed by adjuvant chemo and radiation.   I am sure that the gastroenterologist will help with the reflux.  I told her that she needs to go back to see the orthopedist about her back.  I still do not see any evidence of cancer recurrence.  I do still think that her cancer will ever recur.  We will get her back here in about 2 or 3 months so we can just follow-up with her.    Volanda Napoleon, MD 5/8/202012:36 PM

## 2018-07-02 LAB — IRON AND TIBC
Iron: 65 ug/dL (ref 41–142)
Saturation Ratios: 38 % (ref 21–57)
TIBC: 174 ug/dL — ABNORMAL LOW (ref 236–444)
UIBC: 109 ug/dL — ABNORMAL LOW (ref 120–384)

## 2018-07-02 LAB — FERRITIN: Ferritin: 223 ng/mL (ref 11–307)

## 2018-07-17 ENCOUNTER — Other Ambulatory Visit: Payer: Self-pay | Admitting: *Deleted

## 2018-07-17 DIAGNOSIS — C169 Malignant neoplasm of stomach, unspecified: Secondary | ICD-10-CM

## 2018-07-17 DIAGNOSIS — C163 Malignant neoplasm of pyloric antrum: Secondary | ICD-10-CM

## 2018-07-17 MED ORDER — HYDROCODONE-ACETAMINOPHEN 5-325 MG PO TABS: 1.0000 | ORAL_TABLET | Freq: Four times a day (QID) | ORAL | 0 refills | Status: DC | PRN

## 2018-08-07 ENCOUNTER — Telehealth: Payer: Self-pay | Admitting: *Deleted

## 2018-08-07 NOTE — Telephone Encounter (Signed)
CALLED PATIENT TO ASK ABOUT RESCHEDULING FU ON 08-15-18 DUE TO DR. SQUIRE BEING ON VACATION, RESCHEDULED FOR 08-31-18 @ 11:40 AM, VM FULL UNABLE TO LEAVE MESSAGE, MAILED APPT. CARD

## 2018-08-08 ENCOUNTER — Telehealth: Payer: Self-pay | Admitting: Hematology & Oncology

## 2018-08-08 ENCOUNTER — Inpatient Hospital Stay: Payer: Medicaid Other | Attending: Hematology & Oncology

## 2018-08-08 ENCOUNTER — Inpatient Hospital Stay (HOSPITAL_BASED_OUTPATIENT_CLINIC_OR_DEPARTMENT_OTHER): Payer: Medicaid Other | Admitting: Hematology & Oncology

## 2018-08-08 ENCOUNTER — Other Ambulatory Visit: Payer: Self-pay

## 2018-08-08 ENCOUNTER — Encounter: Payer: Self-pay | Admitting: Hematology & Oncology

## 2018-08-08 ENCOUNTER — Inpatient Hospital Stay: Payer: Medicaid Other

## 2018-08-08 VITALS — BP 112/76 | HR 58 | Temp 98.2°F | Resp 18 | Wt 145.1 lb

## 2018-08-08 DIAGNOSIS — R110 Nausea: Secondary | ICD-10-CM

## 2018-08-08 DIAGNOSIS — D500 Iron deficiency anemia secondary to blood loss (chronic): Secondary | ICD-10-CM

## 2018-08-08 DIAGNOSIS — D649 Anemia, unspecified: Secondary | ICD-10-CM | POA: Insufficient documentation

## 2018-08-08 DIAGNOSIS — Z85028 Personal history of other malignant neoplasm of stomach: Secondary | ICD-10-CM

## 2018-08-08 DIAGNOSIS — C163 Malignant neoplasm of pyloric antrum: Secondary | ICD-10-CM

## 2018-08-08 DIAGNOSIS — C169 Malignant neoplasm of stomach, unspecified: Secondary | ICD-10-CM

## 2018-08-08 LAB — CBC WITH DIFFERENTIAL (CANCER CENTER ONLY)
Abs Immature Granulocytes: 0.01 10*3/uL (ref 0.00–0.07)
Basophils Absolute: 0 10*3/uL (ref 0.0–0.1)
Basophils Relative: 1 %
Eosinophils Absolute: 0.2 10*3/uL (ref 0.0–0.5)
Eosinophils Relative: 4 %
HCT: 34.8 % — ABNORMAL LOW (ref 36.0–46.0)
Hemoglobin: 10.6 g/dL — ABNORMAL LOW (ref 12.0–15.0)
Immature Granulocytes: 0 %
Lymphocytes Relative: 28 %
Lymphs Abs: 1.2 10*3/uL (ref 0.7–4.0)
MCH: 28.5 pg (ref 26.0–34.0)
MCHC: 30.5 g/dL (ref 30.0–36.0)
MCV: 93.5 fL (ref 80.0–100.0)
Monocytes Absolute: 0.5 10*3/uL (ref 0.1–1.0)
Monocytes Relative: 12 %
Neutro Abs: 2.3 10*3/uL (ref 1.7–7.7)
Neutrophils Relative %: 55 %
Platelet Count: 244 10*3/uL (ref 150–400)
RBC: 3.72 MIL/uL — ABNORMAL LOW (ref 3.87–5.11)
RDW: 12.9 % (ref 11.5–15.5)
WBC Count: 4.2 10*3/uL (ref 4.0–10.5)
nRBC: 0 % (ref 0.0–0.2)

## 2018-08-08 LAB — CMP (CANCER CENTER ONLY)
ALT: 9 U/L (ref 0–44)
AST: 14 U/L — ABNORMAL LOW (ref 15–41)
Albumin: 3.7 g/dL (ref 3.5–5.0)
Alkaline Phosphatase: 77 U/L (ref 38–126)
Anion gap: 4 — ABNORMAL LOW (ref 5–15)
BUN: 12 mg/dL (ref 6–20)
CO2: 30 mmol/L (ref 22–32)
Calcium: 8.9 mg/dL (ref 8.9–10.3)
Chloride: 105 mmol/L (ref 98–111)
Creatinine: 0.89 mg/dL (ref 0.44–1.00)
GFR, Est AFR Am: >60 mL/min (ref >60)
GFR, Estimated: >60 mL/min (ref >60)
Glucose, Bld: 124 mg/dL — ABNORMAL HIGH (ref 70–99)
Potassium: 4.6 mmol/L (ref 3.5–5.1)
Sodium: 139 mmol/L (ref 135–145)
Total Bilirubin: 0.3 mg/dL (ref 0.3–1.2)
Total Protein: 6.1 g/dL — ABNORMAL LOW (ref 6.5–8.1)

## 2018-08-08 MED ORDER — SODIUM CHLORIDE 0.9% FLUSH
10.0000 mL | Freq: Once | INTRAVENOUS | Status: AC
  Administered 2018-08-08: 14:00:00 10 mL
  Filled 2018-08-08: qty 10

## 2018-08-08 MED ORDER — HEPARIN SOD (PORK) LOCK FLUSH 100 UNIT/ML IV SOLN
500.0000 [IU] | Freq: Once | INTRAVENOUS | Status: AC
  Administered 2018-08-08: 14:00:00 500 [IU] via INTRAVENOUS
  Filled 2018-08-08: qty 5

## 2018-08-08 NOTE — Telephone Encounter (Signed)
Called and LMVM for patient with date/times of follow up appointmnts per 6/17 los

## 2018-08-08 NOTE — Progress Notes (Signed)
Hematology and Oncology Follow Up Visit  Paula [name] [id number] [birth year] 53 y.o. 08/08/2018   Principle Diagnosis:  Stage IIA (T1a, N2, M0) gastric adenocarcinoma diagnosed in July of 2015 Intermittent iron deficiency anemia  Past Therapy: Status post diagnostic laparoscopy, distal gastrectomy with billroth II anastamosis and feeding gastrojejunostomy tube under the care of Dr. Barry Dienes on 09/20/2013 Adjuvant systemic chemotherapy with 5-FU 600 mg/M2 with leucovorin on days 1, 8, 15, 22, 29 and 36 every 8 weeks Adjuvant concurrent chemoradiation with Xeloda 650 mg/M2 by mouth twice a day for 5 days every week with radiation. The patient completed the course of radiotherapy but Paula [name] declined to take Xeloda during her radiation  Current Therapy:   Observation IV iron as indicated   Interim History:  Paula [name] is back for follow-up..  Paula [name] feels okay.  Still having back issues.  Sees the orthopedist on Friday.  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  There really is not much that can be done for this outside of maybe steroid injections.    Paula [name] is a little worried about weight loss.  Her weight is only down 2 or 3 pounds since we last saw her.  Paula [name] is taking a new supplement.  Paula [name] still has some occasional nausea.  I think this is always to be chronic for her.  Paula [name] has had no bleeding.  There is been no leg swelling.  Paula [name] has had no rashes.  Paula [name] has had no fever.  Overall, her performance status is ECOG 1.  Medications:  Allergies as of 08/08/2018      Reactions   Tape Itching   Ok with adhesive tape, ok with paper tape per patient      Medication List       Accurate as of August 08, 2018  2:08 PM. If you have any questions, ask your nurse or doctor.        STOP taking these medications   dexlansoprazole 60 MG capsule Commonly known as: DEXILANT Stopped by: Volanda Napoleon, MD     TAKE these medications   carvedilol 6.25 MG tablet Commonly known  as: COREG Take 6.25 mg by mouth 2 (two) times daily.   fluticasone 50 MCG/ACT nasal spray Commonly known as: FLONASE   HYDROcodone-acetaminophen 5-325 MG tablet Commonly known as: NORCO/VICODIN Take 1 tablet by mouth every 6 (six) hours as needed for moderate pain.   lidocaine-prilocaine cream Commonly known as: EMLA Apply 1 application topically as needed.   omeprazole 20 MG capsule Commonly known as: PRILOSEC Take 20 mg by mouth daily.   promethazine 25 MG tablet Commonly known as: PHENERGAN Take 1 tablet (25 mg total) by mouth every 6 (six) hours as needed for nausea or vomiting.       Allergies:  Allergies  Allergen Reactions  . Tape Itching    Ok with adhesive tape, ok with paper tape per patient    Past Medical History, Surgical history, Social history, and Family History were reviewed and updated.  Review of Systems: Review of Systems  Constitutional: Negative.   HENT: Negative.   Eyes: Negative.   Respiratory: Negative.   Cardiovascular: Negative.   Gastrointestinal: Positive for heartburn, nausea and vomiting.  Genitourinary: Negative.   Musculoskeletal: Negative.   Skin: Negative.   Neurological: Negative.   Endo/Heme/Allergies: Negative.   Psychiatric/Behavioral: Negative.       Physical Exam:  weight is 145 lb 1 oz (65.8 kg). Her oral temperature is 98.2 F (  36.8 C). Her blood pressure is 112/76 and her pulse is 58 (abnormal). Her respiration is 18 and oxygen saturation is 98%.   Wt Readings from Last 3 Encounters:  08/08/18 145 lb 1 oz (65.8 kg)  06/29/18 148 lb 6.4 oz (67.3 kg)  05/11/18 149 lb (67.6 kg)    Physical Exam Vitals signs reviewed.  HENT:     Head: Normocephalic and atraumatic.  Eyes:     Pupils: Pupils are equal, round, and reactive to light.  Neck:     Musculoskeletal: Normal range of motion.  Cardiovascular:     Rate and Rhythm: Normal rate and regular rhythm.     Heart sounds: Normal heart sounds.  Pulmonary:      Effort: Pulmonary effort is normal.     Breath sounds: Normal breath sounds.  Abdominal:     General: Bowel sounds are normal.     Palpations: Abdomen is soft.     Comments: Soft.  Paula [name] has probably scars are well-healed.  There is no distention.  Bowel sounds are present.  There is no abdominal mass.  There is no palpable liver or spleen tip.  Musculoskeletal: Normal range of motion.        General: No tenderness or deformity.     Comments: Back exam shows no tenderness over the spine, ribs or hips.  No paravertebral muscle spasms are noted.  Lymphadenopathy:     Cervical: No cervical adenopathy.  Skin:    General: Skin is warm and dry.     Findings: No erythema or rash.  Neurological:     Mental Status: Paula [name] is alert and oriented to person, place, and time.  Psychiatric:        Behavior: Behavior normal.        Thought Content: Thought content normal.        Judgment: Judgment normal.      Lab Results  Component Value Date   WBC 4.3 06/29/2018   HGB 10.3 (L) 06/29/2018   HCT 32.3 (L) 06/29/2018   MCV 92.0 06/29/2018   PLT 244 06/29/2018   Lab Results  Component Value Date   FERRITIN 223 06/29/2018   IRON 65 06/29/2018   TIBC 174 (L) 06/29/2018   UIBC 109 (L) 06/29/2018   IRONPCTSAT 38 06/29/2018   Lab Results  Component Value Date   RBC 3.51 (L) 06/29/2018   No results found for: Nils Pyle [facility] Lab Results  Component Value Date   IGGSERUM 1,273 05/21/2015   IGMSERUM 40 05/21/2015   No results found for: Odetta Pink, SPEI   Chemistry      Component Value Date/Time   NA 138 06/29/2018 1121   NA 140 01/26/2017 1002   NA 140 09/24/2015 0953   K 3.8 06/29/2018 1121   K 3.5 01/26/2017 1002   K 4.0 09/24/2015 0953   CL 105 06/29/2018 1121   CL 106 01/26/2017 1002   CO2 29 06/29/2018 1121   CO2 26 01/26/2017 1002   CO2 23 09/24/2015 0953   BUN 7 06/29/2018 1121   BUN 6 (L)  01/26/2017 1002   BUN 8.6 09/24/2015 0953   CREATININE 0.67 06/29/2018 1121   CREATININE 1.1 01/26/2017 1002   CREATININE 1.0 09/24/2015 0953      Component Value Date/Time   CALCIUM 8.7 (L) 06/29/2018 1121   CALCIUM 8.9 01/26/2017 1002   CALCIUM 9.6 09/24/2015 0953   ALKPHOS 69 06/29/2018 1121   ALKPHOS 68 01/26/2017  1002   ALKPHOS 99 09/24/2015 0953   AST 13 (L) 06/29/2018 1121   AST 17 09/24/2015 0953   ALT 10 06/29/2018 1121   ALT 17 01/26/2017 1002   ALT 11 09/24/2015 0953   BILITOT 0.4 06/29/2018 1121   BILITOT 0.83 09/24/2015 0953       Impression and Plan: Paula [name] is a pleasant 53 yo African American female with history of stage II gastric cancer and partial gastrectomy followed by adjuvant chemo and radiation.   Paula [name] is little bit anemic today.  Will be interesting to see what her iron studies show.  At some point, we may have to check her erythropoietin level.  Maybe Paula [name] has a low erythropoietin level.  I will plan to get her back after the summer is over.  We will try to get her back in September.   Volanda Napoleon, MD 6/17/20202:08 PM

## 2018-08-08 NOTE — Patient Instructions (Signed)

## 2018-08-09 ENCOUNTER — Telehealth: Payer: Self-pay | Admitting: *Deleted

## 2018-08-09 LAB — FERRITIN: Ferritin: 197 ng/mL (ref 11–307)

## 2018-08-09 LAB — IRON AND TIBC
Iron: 74 ug/dL (ref 41–142)
Saturation Ratios: 38 % (ref 21–57)
TIBC: 193 ug/dL — ABNORMAL LOW (ref 236–444)
UIBC: 119 ug/dL — ABNORMAL LOW (ref 120–384)

## 2018-08-09 NOTE — Telephone Encounter (Signed)
Message left on pt.'s private number to inform her per order of Dr. Marin Olp that the iron level is ok.  Instructed pt to call office back with any questions or concerns.

## 2018-08-09 NOTE — Telephone Encounter (Signed)
-----   Message from Volanda Napoleon, MD sent at 08/09/2018  2:04 PM EDT ----- Call - the iron level is ok!!  Laurey Arrow

## 2018-08-15 ENCOUNTER — Telehealth: Payer: Self-pay | Admitting: *Deleted

## 2018-08-15 ENCOUNTER — Other Ambulatory Visit: Payer: Self-pay | Admitting: *Deleted

## 2018-08-15 ENCOUNTER — Ambulatory Visit: Payer: Self-pay | Admitting: Radiation Oncology

## 2018-08-15 DIAGNOSIS — C169 Malignant neoplasm of stomach, unspecified: Secondary | ICD-10-CM

## 2018-08-15 DIAGNOSIS — C163 Malignant neoplasm of pyloric antrum: Secondary | ICD-10-CM

## 2018-08-15 MED ORDER — HYDROCODONE-ACETAMINOPHEN 5-325 MG PO TABS: 1.0000 | ORAL_TABLET | Freq: Four times a day (QID) | ORAL | 0 refills | Status: DC | PRN

## 2018-08-15 NOTE — Telephone Encounter (Signed)
A message was left, re: follow up visit. 

## 2018-08-29 NOTE — Progress Notes (Signed)
[name] presents for follow up of radiation completed 06/17/14 to her gastic region. She last saw Dr. Marin Olp on 08/08/18 and has an appointment to see him again on 11/09/18. Weight and vitals stable. Reports constant low back pain 5 on a scale of 0-10 that radiates down to her right hip. Reports taking vicodin to manage this pain. Explains she is scheduled for an injection on 08/07/2018. Reports she continues to have nausea and vomit from time to time but not as frequently as before. Reports normal bowel movements without blood. Reports headaches have slightly improved with electrolytes. Denies dizziness. Reports mild dizziness. Denies diplopia or ringing in her ears. Reports some weight loss (4lb)but relates this to new herb remedy she began.   BP 113/75 (BP Location: Left Arm, Patient Position: Sitting)   Pulse 65   Temp 99.4 F (37.4 C) (Oral)   Resp 20   Ht 5\' 1"  (1.549 m)   Wt 141 lb 6.4 oz (64.1 kg)   LMP 02/28/2016 (Exact Date)   SpO2 100%   BMI 26.72 kg/m  Wt Readings from Last 3 Encounters:  08/31/18 141 lb 6.4 oz (64.1 kg)  08/08/18 145 lb 1 oz (65.8 kg)  06/29/18 148 lb 6.4 oz (67.3 kg)

## 2018-08-31 ENCOUNTER — Other Ambulatory Visit: Payer: Self-pay

## 2018-08-31 ENCOUNTER — Ambulatory Visit
Admission: RE | Admit: 2018-08-31 | Discharge: 2018-08-31 | Disposition: A | Discharge status: Home / Self Care | Payer: Medicaid Other | Source: Ambulatory Visit | Attending: Radiation Oncology | Admitting: Radiation Oncology

## 2018-08-31 ENCOUNTER — Encounter: Payer: Self-pay | Admitting: Radiation Oncology

## 2018-08-31 DIAGNOSIS — R112 Nausea with vomiting, unspecified: Secondary | ICD-10-CM | POA: Insufficient documentation

## 2018-08-31 DIAGNOSIS — Z923 Personal history of irradiation: Secondary | ICD-10-CM | POA: Diagnosis not present

## 2018-08-31 DIAGNOSIS — Z90721 Acquired absence of ovaries, unilateral: Secondary | ICD-10-CM | POA: Diagnosis not present

## 2018-08-31 DIAGNOSIS — Z85028 Personal history of other malignant neoplasm of stomach: Secondary | ICD-10-CM | POA: Diagnosis present

## 2018-08-31 DIAGNOSIS — Z79899 Other long term (current) drug therapy: Secondary | ICD-10-CM | POA: Insufficient documentation

## 2018-08-31 DIAGNOSIS — C163 Malignant neoplasm of pyloric antrum: Secondary | ICD-10-CM

## 2018-08-31 DIAGNOSIS — R51 Headache: Secondary | ICD-10-CM | POA: Insufficient documentation

## 2018-08-31 DIAGNOSIS — R634 Abnormal weight loss: Secondary | ICD-10-CM | POA: Diagnosis not present

## 2018-08-31 DIAGNOSIS — Z90710 Acquired absence of both cervix and uterus: Secondary | ICD-10-CM | POA: Insufficient documentation

## 2018-08-31 DIAGNOSIS — M545 Low back pain: Secondary | ICD-10-CM | POA: Diagnosis not present

## 2018-08-31 NOTE — Progress Notes (Signed)
Radiation Oncology         [phone]) [phone] ________________________________  Name: Paula [name] MRN: [id number]  Date: 08/31/2018  DOB: 08-May-1965  Follow-Up Visit Note  Outpatient  CC: [facility]  Curt Bears, MD  Diagnosis and Prior Radiotherapy:     ICD-10-CM   1. 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 She last saw Dr. Marin Olp on 08/07/2017 for follow up. She continues on observation.  CT chest/abdomen/pelvis on 10/09/2017 was negative for metastatic disease. Abdomen/pelvis CT performed on 03/14/2018 for flank pain was also negative for metastatic disease.  She underwent hysterectomy with left salpingectomy on 11/09/2017 under Dr. Harolyn Rutherford.  She has plans to see medical oncology again on 11/09/18. Reports chronic constant low back pain 5 on a scale of 0-10 that radiates down to her right hip. Reports taking vicodin to manage this pain. Explains she is scheduled for an injection on 09/06/2018. Reports she continues to have nausea and vomit from time to time but not as frequently as before.  She continues to follow with gastroenterology and reports normal bowel movements without blood or tarry appearance. Reports headaches have slightly improved with electrolytes. Denies diplopia or ringing in her ears. Reports some weight loss (4lb)but relates this to new herb remedy she began.     ALLERGIES:  is allergic to tape.  Meds: Current Outpatient Medications  Medication Sig Dispense Refill  . carvedilol (COREG) 6.25 MG tablet Take 6.25 mg by mouth 2 (two) times daily.     . fluticasone (FLONASE) 50 MCG/ACT nasal spray   3  .  HYDROcodone-acetaminophen (NORCO/VICODIN) 5-325 MG tablet Take 1 tablet by mouth every 6 (six) hours as needed for moderate pain. 60 tablet 0  . lidocaine-prilocaine (EMLA) cream Apply 1 application topically as needed. 30 g 6  . omeprazole (PRILOSEC) 20 MG capsule Take 20 mg by mouth daily.    . promethazine (PHENERGAN) 25 MG tablet Take 1 tablet (25 mg total) by mouth every 6 (six) hours as needed for nausea or vomiting. 30 tablet 1  . vitamin B-12 (CYANOCOBALAMIN) 500 MCG tablet Take 2,000 mcg by mouth daily.     No current facility-administered medications for this encounter.    Facility-Administered Medications Ordered in Other Encounters  Medication Dose Route Frequency Provider Last Rate Last Dose  . sodium chloride flush (NS) 0.9 % injection 10 mL  10 mL Intravenous PRN Volanda Napoleon, MD   10 mL at 01/03/18 1316   REVIEW OF SYSTEMS:as above  Physical Findings: The patient is in no acute distress. Patient is alert and oriented.  height is 5\' 1"  (1.549 m) and weight is 141 lb 6.4 oz (64.1 kg). Her oral temperature is 99.4 F (37.4 C). Her blood pressure is 113/75 and her pulse is 65. Her respiration is 20 and oxygen saturation is 100%.   General: Alert and oriented, in no acute distress. Abdomen: Abdomen is nontender with no masses and no rigidity or guarding. Extremities: No edema. Skin: No concerning lesions. Neurologic: No obvious focalities. Speech is fluent. Coordination is intact. Psychiatric: Judgment and insight are intact. Affect is appropriate.  Lab Findings: Lab Results  Component Value Date   WBC 4.2 08/08/2018   HGB 10.6 (L) 08/08/2018   HCT 34.8 (L) 08/08/2018   MCV 93.5 08/08/2018   PLT 244 08/08/2018    Radiographic Findings: As above  Impression/Plan: NED on clinical exam and on recent imaging.  The patient is scheduled to continue close follow-up with medical oncology and meet with Dr. Marin Olp on 11/09/2018.  She will continue following with  gastroenterology.  Follow up with me PRN.  I wished her the very best and encouraged her to call if I can help her at all in the future.  I spent 15 minutes face to face with the patient and more than 50% of that time was spent in counseling and/or coordination of care. _____________________________________   Eppie Gibson, MD  This document serves as a record of services personally performed by Eppie Gibson, MD. It was created on her behalf by Wilburn Mylar, a trained medical scribe. The creation of this record is based on the scribe's personal observations and the provider's statements to them. This document has been checked and approved by the attending provider.

## 2018-09-05 ENCOUNTER — Encounter: Payer: Self-pay | Admitting: Radiation Oncology

## 2018-09-10 ENCOUNTER — Ambulatory Visit (INDEPENDENT_AMBULATORY_CARE_PROVIDER_SITE_OTHER): Payer: Medicare Other | Admitting: Family Medicine

## 2018-09-10 ENCOUNTER — Other Ambulatory Visit: Payer: Self-pay

## 2018-09-10 ENCOUNTER — Encounter: Payer: Self-pay | Admitting: Family Medicine

## 2018-09-10 VITALS — BP 105/70 | HR 68 | Ht 61.0 in | Wt 140.0 lb

## 2018-09-10 DIAGNOSIS — S060X0A Concussion without loss of consciousness, initial encounter: Secondary | ICD-10-CM

## 2018-09-10 DIAGNOSIS — M791 Myalgia, unspecified site: Secondary | ICD-10-CM | POA: Insufficient documentation

## 2018-09-10 MED ORDER — IBUPROFEN-FAMOTIDINE 800-26.6 MG PO TABS: 1.0000 | ORAL_TABLET | Freq: Three times a day (TID) | ORAL | 3 refills | Status: DC
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
## 2018-09-10 NOTE — Patient Instructions (Signed)
Nice to meet you Please take the duexis three times a day  We have made a referral to physical therapy and they will contact you.  Please try massage  Please use ice  Please send me a message in MyChart with any questions or updates.  Please see me back in 2 weeks.   --Dr. Raeford Razor
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
## 2018-09-13 ENCOUNTER — Other Ambulatory Visit: Payer: Medicare Other

## 2018-09-13 ENCOUNTER — Ambulatory Visit: Payer: Medicare Other | Admitting: Hematology & Oncology

## 2018-09-14 ENCOUNTER — Other Ambulatory Visit: Payer: Self-pay | Admitting: *Deleted

## 2018-09-14 DIAGNOSIS — C163 Malignant neoplasm of pyloric antrum: Secondary | ICD-10-CM

## 2018-09-14 DIAGNOSIS — C169 Malignant neoplasm of stomach, unspecified: Secondary | ICD-10-CM

## 2018-09-14 MED ORDER — HYDROCODONE-ACETAMINOPHEN 5-325 MG PO TABS: 1.0000 | ORAL_TABLET | Freq: Four times a day (QID) | ORAL | 0 refills | Status: DC | PRN

## 2018-09-17 ENCOUNTER — Ambulatory Visit: Payer: No Typology Code available for payment source | Attending: Family Medicine | Admitting: Physical Therapy

## 2018-09-17 ENCOUNTER — Encounter: Payer: Self-pay | Admitting: Physical Therapy

## 2018-09-17 ENCOUNTER — Other Ambulatory Visit: Payer: Self-pay

## 2018-09-17 DIAGNOSIS — R42 Dizziness and giddiness: Secondary | ICD-10-CM

## 2018-09-17 DIAGNOSIS — M542 Cervicalgia: Secondary | ICD-10-CM

## 2018-09-17 DIAGNOSIS — M545 Low back pain, unspecified: Secondary | ICD-10-CM

## 2018-09-17 DIAGNOSIS — M6281 Muscle weakness (generalized): Secondary | ICD-10-CM | POA: Diagnosis present

## 2018-09-17 DIAGNOSIS — R29898 Other symptoms and signs involving the musculoskeletal system: Secondary | ICD-10-CM | POA: Insufficient documentation
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
## 2018-09-21 ENCOUNTER — Ambulatory Visit: Payer: No Typology Code available for payment source | Admitting: Physical Therapy

## 2018-09-21 ENCOUNTER — Encounter: Payer: Self-pay | Admitting: Physical Therapy

## 2018-09-21 ENCOUNTER — Other Ambulatory Visit: Payer: Self-pay

## 2018-09-21 DIAGNOSIS — M542 Cervicalgia: Secondary | ICD-10-CM

## 2018-09-21 DIAGNOSIS — M545 Low back pain, unspecified: Secondary | ICD-10-CM

## 2018-09-21 DIAGNOSIS — R29898 Other symptoms and signs involving the musculoskeletal system: Secondary | ICD-10-CM

## 2018-09-21 DIAGNOSIS — M6281 Muscle weakness (generalized): Secondary | ICD-10-CM

## 2018-09-21 DIAGNOSIS — R42 Dizziness and giddiness: Secondary | ICD-10-CM
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
## 2018-09-24 ENCOUNTER — Ambulatory Visit (INDEPENDENT_AMBULATORY_CARE_PROVIDER_SITE_OTHER): Payer: Medicare Other | Admitting: Family Medicine

## 2018-09-24 ENCOUNTER — Encounter: Payer: Self-pay | Admitting: Family Medicine

## 2018-09-24 ENCOUNTER — Ambulatory Visit: Payer: No Typology Code available for payment source | Attending: Family Medicine

## 2018-09-24 ENCOUNTER — Other Ambulatory Visit: Payer: Self-pay

## 2018-09-24 ENCOUNTER — Other Ambulatory Visit: Payer: Self-pay | Admitting: Family

## 2018-09-24 VITALS — BP 113/75 | HR 58 | Ht 60.0 in | Wt 140.0 lb

## 2018-09-24 DIAGNOSIS — S060X0D Concussion without loss of consciousness, subsequent encounter: Secondary | ICD-10-CM

## 2018-09-24 DIAGNOSIS — R42 Dizziness and giddiness: Secondary | ICD-10-CM | POA: Diagnosis present

## 2018-09-24 DIAGNOSIS — M545 Low back pain, unspecified: Secondary | ICD-10-CM

## 2018-09-24 DIAGNOSIS — R29898 Other symptoms and signs involving the musculoskeletal system: Secondary | ICD-10-CM | POA: Insufficient documentation

## 2018-09-24 DIAGNOSIS — S060X0A Concussion without loss of consciousness, initial encounter: Secondary | ICD-10-CM

## 2018-09-24 DIAGNOSIS — M542 Cervicalgia: Secondary | ICD-10-CM | POA: Diagnosis not present

## 2018-09-24 DIAGNOSIS — M6281 Muscle weakness (generalized): Secondary | ICD-10-CM | POA: Diagnosis present

## 2018-09-24 MED ORDER — TRAZODONE HCL 50 MG PO TABS: 50.0000 mg | ORAL_TABLET | Freq: Every day | ORAL | 1 refills | Status: DC

## 2018-09-24 MED FILL — traZODone HCL 50 MG TABS: 50 | 30 days supply | Qty: 30 | Fill #0
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
## 2018-09-24 NOTE — Patient Instructions (Signed)
Good to see you Sorry to hear about your losses  Please try the medicine to help with sleep  Please continue light exercises and physical therapy  You will get a call about psychology   Please send me a message in MyChart with any questions or updates.  Please see me back in 4 weeks.   --Dr. Raeford Razor
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
## 2018-09-26 ENCOUNTER — Ambulatory Visit: Payer: No Typology Code available for payment source | Admitting: Physical Therapy

## 2018-09-27 ENCOUNTER — Ambulatory Visit: Payer: No Typology Code available for payment source | Admitting: Physical Therapy

## 2018-09-27 ENCOUNTER — Encounter: Payer: Self-pay | Admitting: Physical Therapy

## 2018-09-27 ENCOUNTER — Other Ambulatory Visit: Payer: Self-pay

## 2018-09-27 DIAGNOSIS — R29898 Other symptoms and signs involving the musculoskeletal system: Secondary | ICD-10-CM

## 2018-09-27 DIAGNOSIS — M6281 Muscle weakness (generalized): Secondary | ICD-10-CM

## 2018-09-27 DIAGNOSIS — M542 Cervicalgia: Secondary | ICD-10-CM | POA: Diagnosis not present

## 2018-09-27 DIAGNOSIS — M545 Low back pain, unspecified: Secondary | ICD-10-CM

## 2018-09-27 DIAGNOSIS — R42 Dizziness and giddiness: Secondary | ICD-10-CM
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
## 2018-09-28 ENCOUNTER — Ambulatory Visit: Payer: No Typology Code available for payment source

## 2018-10-05 ENCOUNTER — Other Ambulatory Visit: Payer: Self-pay

## 2018-10-05 ENCOUNTER — Encounter: Payer: Self-pay | Admitting: Physical Therapy

## 2018-10-05 ENCOUNTER — Ambulatory Visit: Payer: No Typology Code available for payment source | Admitting: Physical Therapy

## 2018-10-05 DIAGNOSIS — M545 Low back pain, unspecified: Secondary | ICD-10-CM

## 2018-10-05 DIAGNOSIS — M542 Cervicalgia: Secondary | ICD-10-CM | POA: Diagnosis not present

## 2018-10-05 DIAGNOSIS — M6281 Muscle weakness (generalized): Secondary | ICD-10-CM

## 2018-10-05 DIAGNOSIS — R29898 Other symptoms and signs involving the musculoskeletal system: Secondary | ICD-10-CM

## 2018-10-05 DIAGNOSIS — R42 Dizziness and giddiness: Secondary | ICD-10-CM

## 2018-10-05 NOTE — Patient Instructions (Signed)
     Access Code: [id number]
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
## 2018-10-09 ENCOUNTER — Other Ambulatory Visit: Payer: Self-pay

## 2018-10-09 ENCOUNTER — Ambulatory Visit: Payer: No Typology Code available for payment source

## 2018-10-09 DIAGNOSIS — M542 Cervicalgia: Secondary | ICD-10-CM | POA: Diagnosis not present

## 2018-10-09 DIAGNOSIS — R42 Dizziness and giddiness: Secondary | ICD-10-CM

## 2018-10-09 DIAGNOSIS — M6281 Muscle weakness (generalized): Secondary | ICD-10-CM

## 2018-10-09 DIAGNOSIS — M545 Low back pain, unspecified: Secondary | ICD-10-CM

## 2018-10-09 DIAGNOSIS — R29898 Other symptoms and signs involving the musculoskeletal system: Secondary | ICD-10-CM
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
## 2018-10-11 ENCOUNTER — Ambulatory Visit: Payer: No Typology Code available for payment source

## 2018-10-15 ENCOUNTER — Ambulatory Visit (INDEPENDENT_AMBULATORY_CARE_PROVIDER_SITE_OTHER): Payer: Medicare Other | Admitting: Family Medicine

## 2018-10-15 ENCOUNTER — Ambulatory Visit: Payer: No Typology Code available for payment source

## 2018-10-15 ENCOUNTER — Other Ambulatory Visit: Payer: Self-pay | Admitting: *Deleted

## 2018-10-15 ENCOUNTER — Ambulatory Visit: Payer: Self-pay

## 2018-10-15 ENCOUNTER — Other Ambulatory Visit: Payer: Self-pay

## 2018-10-15 ENCOUNTER — Encounter: Payer: Self-pay | Admitting: Family Medicine

## 2018-10-15 VITALS — BP 120/78 | Ht 60.0 in | Wt 146.0 lb

## 2018-10-15 DIAGNOSIS — C163 Malignant neoplasm of pyloric antrum: Secondary | ICD-10-CM

## 2018-10-15 DIAGNOSIS — M545 Low back pain, unspecified: Secondary | ICD-10-CM

## 2018-10-15 DIAGNOSIS — M79604 Pain in right leg: Secondary | ICD-10-CM

## 2018-10-15 DIAGNOSIS — C169 Malignant neoplasm of stomach, unspecified: Secondary | ICD-10-CM

## 2018-10-15 DIAGNOSIS — R29898 Other symptoms and signs involving the musculoskeletal system: Secondary | ICD-10-CM

## 2018-10-15 DIAGNOSIS — M6281 Muscle weakness (generalized): Secondary | ICD-10-CM

## 2018-10-15 DIAGNOSIS — M542 Cervicalgia: Secondary | ICD-10-CM | POA: Diagnosis not present

## 2018-10-15 DIAGNOSIS — R42 Dizziness and giddiness: Secondary | ICD-10-CM

## 2018-10-15 MED ORDER — HYDROCODONE-ACETAMINOPHEN 5-325 MG PO TABS: 1.0000 | ORAL_TABLET | Freq: Four times a day (QID) | ORAL | 0 refills | Status: DC | PRN
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
## 2018-10-15 NOTE — Patient Instructions (Signed)
Good to see you Please keep the area compressed Please try warmth   Please send me a message in MyChart with any questions or updates.  Please see me back in whenever previously schedule or you may change it to a few weeks later.   --Dr. Raeford Razor
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
## 2018-10-22 ENCOUNTER — Ambulatory Visit: Payer: Medicare Other | Admitting: Family Medicine

## 2018-10-22 NOTE — Progress Notes (Deleted)
Paula [name] - 53 y.o. female MRN [id number]  Date of birth: 06/28/1965  SUBJECTIVE:  Including CC & ROS.  No chief complaint on file.   Paula [name] is a 53 y.o. female that is  ***.  ***   Review of Systems  HISTORY: Past Medical, Surgical, Social, and Family History Reviewed & Updated per EMR.   Pertinent Historical Findings include:  Past Medical History:  Diagnosis Date  . Arthritis    back  . Costochondritis   . Esophageal reflux    On omeprazole  . Family history of cancer   . Headache(784.0) 2011   Migraines  . Heart murmur    never caused any problems  . History of hiatal hernia   . History of radiation therapy 05/12/14- 06/17/14   post-op gastric region/ nodes/ 45 Gy in 25 fractions.   . Hypertension    started around 2011  . Iron deficiency anemia due to chronic blood loss 05/25/2015  . 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 On antineoplastic chemotherapy    5-FU and leucovorin  . Ovarian cyst   . Pneumonia    x 1  . Pulmonary emboli ([place]) 2015  . Stomach cancer ([place]) dx'd 08/2013  . Tuberculosis    ? [id number]  . Wears partial dentures    upper dentures    Past Surgical History:  Procedure Laterality Date  . ABDOMINAL HYSTERECTOMY    . CESAREAN SECTION     x 2  . ESOPHAGOGASTRODUODENOSCOPY N/A 09/16/2013   Procedure: ESOPHAGOGASTRODUODENOSCOPY (EGD);  Surgeon: Wonda Horner, MD;  Location: [facility];  Service: Endoscopy;  Laterality: N/A;  . IR CV LINE INJECTION  08/30/2017  . IR FLUORO GUIDE PORT INSERTION RIGHT  03/21/2017  . IR US GUIDE VASC ACCESS RIGHT  03/21/2017  . LAPAROSCOPY N/A 09/20/2013   Procedure: DIAGNOSTIC LAPAROSCOPY,DISTAL GASTRECTOMY AND FEEDING GASTROJEJUNOSTOMY;  Surgeon: [name], MD;  Location: [facility];  Service: General;  Laterality: N/A;  . OOPHORECTOMY     Around 1985 left ovary removal  . PORTACATH PLACEMENT Left 11/18/2013   Procedure: INSERTION PORT-A-CATH;  Surgeon: [name], MD;  Location: [facility];   Service: General;  Laterality: Left;  . TONSILLECTOMY     Around 1994  . TUBAL LIGATION    . UNILATERAL SALPINGECTOMY Left 11/09/2017   Procedure: UNILATERAL SALPINGECTOMY;  Surgeon: Osborne Oman, MD;  Location: [facility];  Service: Gynecology;  Laterality: Left;  Marland Kitchen VAGINAL HYSTERECTOMY Left 11/09/2017   Procedure: HYSTERECTOMY VAGINAL WITH LEFT SALPINGECTOMY;  Surgeon: Osborne Oman, MD;  Location: [facility];  Service: Gynecology;  Laterality: Left;    Allergies  Allergen Reactions  . Tape Itching    Ok with adhesive tape, ok with paper tape per patient    Family History  Problem Relation Age of Onset  . Hypertension Mother   . Diabetes Mellitus II Mother   . Other Mother        hx of hysterectomy for unspecified reason  . Hypertension Sister   . Hypertension Brother   . Heart failure Maternal Grandmother   . Heart Problems Maternal Grandmother   . Hypertension Maternal Grandfather   . Diabetes Maternal Grandfather   . Colon cancer Paternal Uncle        dx. late 50s-early 60s  . Gastric cancer Cousin 61  . Colon cancer Cousin        dx. early-late 15s; (father also had colon cancer)  . Colon cancer Paternal Uncle  dx. late 50s-early 60s  . Lung cancer Paternal Uncle        dx. 45s; smoker  . Colon polyps Paternal Uncle        unknown number  . Heart attack Maternal Aunt   . Heart Problems Paternal Aunt   . Heart attack Paternal Grandfather   . Colon cancer Paternal Grandfather        dx. 50s-60s  . Colon polyps Brother        1 maternal half-brother had approx 3 polyps  . Breast cancer Cousin 73  . Breast cancer Cousin        dx. early 49s or earlier (father had colon cancer)  . Lung cancer Other        PGF's brothers (x4); dx. later in life; lim info     Social History   Socioeconomic History  . Marital status: Legally Separated    Spouse name: Cornelia Copa  . Number of children: 2  . Years of education: Not on file  . Highest education level: Not on  file  Occupational History    Employer: [employer]  Social Needs  . Financial resource strain: Not on file  . Food insecurity    Worry: Not on file    Inability: Not on file  . Transportation needs    Medical: Not on file    Non-medical: Not on file  Tobacco Use  . Smoking status: Former Smoker    Packs/day: 1.00    Years: 16.00    Pack years: 16.00    Types: Cigarettes    Quit date: 02/21/1998    Years since quitting: 20.6  . Smokeless tobacco: Never Used  . Tobacco comment: 0.5-1.0 ppd for 16 yrs  Substance and Sexual Activity  . Alcohol use: Yes    Alcohol/week: 0.0 standard drinks    Comment: occasional wine  . Drug use: No  . Sexual activity: Not Currently    Birth control/protection: Surgical    Comment: Depo Inj 09/2017  Lifestyle  . Physical activity    Days per week: Not on file    Minutes per session: Not on file  . Stress: Not on file  Relationships  . Social Herbalist on phone: Not on file    Gets together: Not on file    Attends religious service: Not on file    Active member of club or organization: Not on file    Attends meetings of clubs or organizations: Not on file    Relationship status: Not on file  . Intimate partner violence    Fear of current or ex partner: Not on file    Emotionally abused: Not on file    Physically abused: Not on file    Forced sexual activity: Not on file  Other Topics Concern  . Not on file  Social History Narrative  . Not on file     PHYSICAL EXAM:  VS: LMP 02/28/2016 (Exact Date)  Physical Exam Gen: NAD, alert, cooperative with exam, well-appearing ENT: normal lips, normal nasal mucosa,  Eye: normal EOM, normal conjunctiva and lids CV:  no edema, +2 pedal pulses   Resp: no accessory muscle use, non-labored,  GI: no masses or tenderness, no hernia  Skin: no rashes, no areas of induration  Neuro: normal tone, normal sensation to touch Psych:  normal insight, alert and oriented MSK:  ***       ASSESSMENT & PLAN:   No problem-specific Assessment & Plan  notes found for this encounter.

## 2018-11-09 ENCOUNTER — Other Ambulatory Visit: Payer: Self-pay | Admitting: *Deleted

## 2018-11-09 ENCOUNTER — Inpatient Hospital Stay (HOSPITAL_BASED_OUTPATIENT_CLINIC_OR_DEPARTMENT_OTHER): Payer: Medicare Other | Admitting: Hematology & Oncology

## 2018-11-09 ENCOUNTER — Inpatient Hospital Stay: Payer: Medicare Other | Attending: Hematology & Oncology

## 2018-11-09 ENCOUNTER — Encounter: Payer: Self-pay | Admitting: Hematology & Oncology

## 2018-11-09 ENCOUNTER — Other Ambulatory Visit: Payer: Self-pay

## 2018-11-09 ENCOUNTER — Inpatient Hospital Stay: Payer: Medicare Other

## 2018-11-09 VITALS — BP 106/68 | HR 72 | Temp 97.7°F | Resp 19 | Wt 133.0 lb

## 2018-11-09 DIAGNOSIS — R112 Nausea with vomiting, unspecified: Secondary | ICD-10-CM | POA: Insufficient documentation

## 2018-11-09 DIAGNOSIS — Z85028 Personal history of other malignant neoplasm of stomach: Secondary | ICD-10-CM | POA: Insufficient documentation

## 2018-11-09 DIAGNOSIS — D509 Iron deficiency anemia, unspecified: Secondary | ICD-10-CM | POA: Insufficient documentation

## 2018-11-09 DIAGNOSIS — C169 Malignant neoplasm of stomach, unspecified: Secondary | ICD-10-CM

## 2018-11-09 DIAGNOSIS — R634 Abnormal weight loss: Secondary | ICD-10-CM | POA: Diagnosis not present

## 2018-11-09 DIAGNOSIS — Z95828 Presence of other vascular implants and grafts: Secondary | ICD-10-CM

## 2018-11-09 DIAGNOSIS — D500 Iron deficiency anemia secondary to blood loss (chronic): Secondary | ICD-10-CM

## 2018-11-09 LAB — CBC WITH DIFFERENTIAL (CANCER CENTER ONLY)
Abs Immature Granulocytes: 0 10*3/uL (ref 0.00–0.07)
Basophils Absolute: 0 10*3/uL (ref 0.0–0.1)
Basophils Relative: 0 %
Eosinophils Absolute: 0 10*3/uL (ref 0.0–0.5)
Eosinophils Relative: 0 %
HCT: 38.7 % (ref 36.0–46.0)
Hemoglobin: 12 g/dL (ref 12.0–15.0)
Immature Granulocytes: 0 %
Lymphocytes Relative: 19 %
Lymphs Abs: 1.1 10*3/uL (ref 0.7–4.0)
MCH: 28.1 pg (ref 26.0–34.0)
MCHC: 31 g/dL (ref 30.0–36.0)
MCV: 90.6 fL (ref 80.0–100.0)
Monocytes Absolute: 0.3 10*3/uL (ref 0.1–1.0)
Monocytes Relative: 5 %
Neutro Abs: 4.3 10*3/uL (ref 1.7–7.7)
Neutrophils Relative %: 76 %
Platelet Count: 153 10*3/uL (ref 150–400)
RBC: 4.27 MIL/uL (ref 3.87–5.11)
RDW: 13.4 % (ref 11.5–15.5)
WBC Count: 5.7 10*3/uL (ref 4.0–10.5)
nRBC: 0 % (ref 0.0–0.2)

## 2018-11-09 LAB — CMP (CANCER CENTER ONLY)
ALT: 12 U/L (ref 0–44)
AST: 15 U/L (ref 15–41)
Albumin: 4.1 g/dL (ref 3.5–5.0)
Alkaline Phosphatase: 81 U/L (ref 38–126)
Anion gap: 5 (ref 5–15)
BUN: 12 mg/dL (ref 6–20)
CO2: 29 mmol/L (ref 22–32)
Calcium: 9.4 mg/dL (ref 8.9–10.3)
Chloride: 102 mmol/L (ref 98–111)
Creatinine: 1 mg/dL (ref 0.44–1.00)
GFR, Est AFR Am: >60 mL/min (ref >60)
GFR, Estimated: >60 mL/min (ref >60)
Glucose, Bld: 127 mg/dL — ABNORMAL HIGH (ref 70–99)
Potassium: 4.6 mmol/L (ref 3.5–5.1)
Sodium: 136 mmol/L (ref 135–145)
Total Bilirubin: 0.5 mg/dL (ref 0.3–1.2)
Total Protein: 6.7 g/dL (ref 6.5–8.1)

## 2018-11-09 MED ORDER — LIDOCAINE-PRILOCAINE 2.5-2.5 % EX CREA: 1.0000 "application " | TOPICAL_CREAM | CUTANEOUS | 6 refills | Status: DC | PRN

## 2018-11-09 MED ORDER — DRONABINOL 5 MG PO CAPS: 5.0000 mg | ORAL_CAPSULE | Freq: Two times a day (BID) | ORAL | 0 refills | Status: DC

## 2018-11-09 MED ORDER — PROMETHAZINE HCL 25 MG PO TABS: 25.0000 mg | ORAL_TABLET | Freq: Four times a day (QID) | ORAL | 1 refills | Status: DC | PRN

## 2018-11-09 MED ORDER — HEPARIN SOD (PORK) LOCK FLUSH 100 UNIT/ML IV SOLN
500.0000 [IU] | Freq: Once | INTRAVENOUS | Status: AC
  Administered 2018-11-09: 500 [IU] via INTRAVENOUS
  Filled 2018-11-09: qty 5

## 2018-11-09 MED ORDER — SODIUM CHLORIDE 0.9% FLUSH
10.0000 mL | Freq: Once | INTRAVENOUS | Status: AC
  Administered 2018-11-09: 10 mL via INTRAVENOUS
  Filled 2018-11-09: qty 10

## 2018-11-09 NOTE — Progress Notes (Signed)
Hematology and Oncology Follow Up Visit  Paula [name] [id number] [birth year] 53 y.o. 11/09/2018   Principle Diagnosis:  Stage IIA (T1a, N2, M0) gastric adenocarcinoma diagnosed in July of 2015 Intermittent iron deficiency anemia  Past Therapy: Status post diagnostic laparoscopy, distal gastrectomy with billroth II anastamosis and feeding gastrojejunostomy tube under the care of Dr. Barry Dienes on 09/20/2013 Adjuvant systemic chemotherapy with 5-FU 600 mg/M2 with leucovorin on days 1, 8, 15, 22, 29 and 36 every 8 weeks Adjuvant concurrent chemoradiation with Xeloda 650 mg/M2 by mouth twice a day for 5 days every week with radiation. The patient completed the course of radiotherapy but she declined to take Xeloda during her radiation  Current Therapy:   Observation IV iron as indicated   Interim History:  [name] is back for follow-up.  On little bit troubled by the fact that she is lost weight.  She is lost 12 pounds since we last saw her.  She is having some nausea and vomiting.  I am not sure as to exactly why this is happening.  She was scoped at [facility] back in December.  This really was unremarkable.  She had a CT scan of the abdomen pelvis in January this also did not show anything that was obvious.  When I listened to her abdomen on exam, her abdomen was pretty quiet.  It sounds like she just might have a slow emptying or intestinal transit.  She is not having diarrhea.  Her back is doing better.  She had a epidural injection yesterday.  Another she is taking hydrocodone.  This certainly could be causing some issues with respect to her intestinal movement.  I just do not think that this is anything with her malignancy.  She is out I think 5 years from having treatment.  Overall, I think her performance status is probably ECOG 1.    Medications:  Allergies as of 11/09/2018      Reactions   Tape Itching   Ok with adhesive tape, ok with paper tape  per patient      Medication List       Accurate as of November 09, 2018  2:58 PM. If you have any questions, ask your nurse or doctor.        carvedilol 6.25 MG tablet Commonly known as: COREG Take 6.25 mg by mouth 2 (two) times daily.   dronabinol 5 MG capsule Commonly known as: MARINOL Take 1 capsule (5 mg total) by mouth 2 (two) times daily before lunch and supper. Started by: Volanda Napoleon, MD   fluticasone 50 MCG/ACT nasal spray Commonly known as: FLONASE   HYDROcodone-acetaminophen 5-325 MG tablet Commonly known as: NORCO/VICODIN Take 1 tablet by mouth every 6 (six) hours as needed for moderate pain.   Ibuprofen-Famotidine 800-26.6 MG Tabs Take 1 tablet by mouth 3 (three) times daily.   lidocaine-prilocaine cream Commonly known as: EMLA Apply 1 application topically as needed.   omeprazole 20 MG capsule Commonly known as: PRILOSEC Take 20 mg by mouth daily.   promethazine 25 MG tablet Commonly known as: PHENERGAN Take 1 tablet (25 mg total) by mouth every 6 (six) hours as needed for nausea or vomiting.   traZODone 50 MG tablet Commonly known as: DESYREL Take 1 tablet (50 mg total) by mouth at bedtime.   vitamin B-12 500 MCG tablet Commonly known as: CYANOCOBALAMIN Take 2,000 mcg by mouth daily.       Allergies:  Allergies  Allergen Reactions  .  Tape Itching    Ok with adhesive tape, ok with paper tape per patient    Past Medical History, Surgical history, Social history, and Family History were reviewed and updated.  Review of Systems: Review of Systems  Constitutional: Negative.   HENT: Negative.   Eyes: Negative.   Respiratory: Negative.   Cardiovascular: Negative.   Gastrointestinal: Positive for heartburn, nausea and vomiting.  Genitourinary: Negative.   Musculoskeletal: Negative.   Skin: Negative.   Neurological: Negative.   Endo/Heme/Allergies: Negative.   Psychiatric/Behavioral: Negative.       Physical Exam:  weight is  133 lb (60.3 kg). Her temporal temperature is 97.7 F (36.5 C). Her blood pressure is 106/68 and her pulse is 72. Her respiration is 19 and oxygen saturation is 100%.   Wt Readings from Last 3 Encounters:  11/09/18 133 lb (60.3 kg)  10/15/18 146 lb (66.2 kg)  09/24/18 140 lb (63.5 kg)    Physical Exam Vitals signs reviewed.  HENT:     Head: Normocephalic and atraumatic.  Eyes:     Pupils: Pupils are equal, round, and reactive to light.  Neck:     Musculoskeletal: Normal range of motion.  Cardiovascular:     Rate and Rhythm: Normal rate and regular rhythm.     Heart sounds: Normal heart sounds.  Pulmonary:     Effort: Pulmonary effort is normal.     Breath sounds: Normal breath sounds.  Abdominal:     General: Bowel sounds are normal.     Palpations: Abdomen is soft.     Comments: Soft.  She has probably scars are well-healed.  There is no distention.  Bowel sounds are minimal.  There is no abdominal mass.  There is no palpable liver or spleen tip.  There is no fluid wave.  She has no guarding or rebound tenderness  Musculoskeletal: Normal range of motion.        General: No tenderness or deformity.     Comments: Back exam shows no tenderness over the spine, ribs or hips.  No paravertebral muscle spasms are noted.  Lymphadenopathy:     Cervical: No cervical adenopathy.  Skin:    General: Skin is warm and dry.     Findings: No erythema or rash.  Neurological:     Mental Status: She is alert and oriented to person, place, and time.  Psychiatric:        Behavior: Behavior normal.        Thought Content: Thought content normal.        Judgment: Judgment normal.      Lab Results  Component Value Date   WBC 5.7 11/09/2018   HGB 12.0 11/09/2018   HCT 38.7 11/09/2018   MCV 90.6 11/09/2018   PLT 153 11/09/2018   Lab Results  Component Value Date   FERRITIN 197 08/08/2018   IRON 74 08/08/2018   TIBC 193 (L) 08/08/2018   UIBC 119 (L) 08/08/2018   IRONPCTSAT 38  08/08/2018   Lab Results  Component Value Date   RBC 4.27 11/09/2018   No results found for: Nils Pyle, [facility] Lab Results  Component Value Date   IGGSERUM 1,273 05/21/2015   IGMSERUM 40 05/21/2015   No results found for: Ronnald Ramp, A1GS, A2GS, Tillman Sers, SPEI   Chemistry      Component Value Date/Time   NA 136 11/09/2018 1306   NA 140 01/26/2017 1002   NA 140 09/24/2015 0953   K 4.6 11/09/2018 1306  K 3.5 01/26/2017 1002   K 4.0 09/24/2015 0953   CL 102 11/09/2018 1306   CL 106 01/26/2017 1002   CO2 29 11/09/2018 1306   CO2 26 01/26/2017 1002   CO2 23 09/24/2015 0953   BUN 12 11/09/2018 1306   BUN 6 (L) 01/26/2017 1002   BUN 8.6 09/24/2015 0953   CREATININE 1.00 11/09/2018 1306   CREATININE 1.1 01/26/2017 1002   CREATININE 1.0 09/24/2015 0953      Component Value Date/Time   CALCIUM 9.4 11/09/2018 1306   CALCIUM 8.9 01/26/2017 1002   CALCIUM 9.6 09/24/2015 0953   ALKPHOS 81 11/09/2018 1306   ALKPHOS 68 01/26/2017 1002   ALKPHOS 99 09/24/2015 0953   AST 15 11/09/2018 1306   AST 17 09/24/2015 0953   ALT 12 11/09/2018 1306   ALT 17 01/26/2017 1002   ALT 11 09/24/2015 0953   BILITOT 0.5 11/09/2018 1306   BILITOT 0.83 09/24/2015 0953       Impression and Plan: [name] is a pleasant 52 yo African American female with history of stage II gastric cancer and partial gastrectomy followed by adjuvant chemo and radiation.   Again, the weight loss really troubles me.  Again I do not think this is anything with respect to her cancer since it has been 5 years now.  I will see if Marinol might help her out a little bit.  This might help stimulate her appetite.  We will see what her iron studies look like.  I do not think she has hyperthyroidism is at all see any other evidence of an overactive thyroid.  I would like to see her back in a month.  This is a little more complicated than I had thought.  Again  the weight loss is quite troublesome.  We had to work through this logically.  I spent about 1/2-hour with her.  Volanda Napoleon, MD 9/18/20202:58 PM

## 2018-11-09 NOTE — Patient Instructions (Signed)
Tunneled Central Venous Catheter Flushing Guide  It is important to flush your tunneled central venous catheter each time you use it, both before and after you use it. Flushing your catheter will help prevent it from clogging. What are the risks? Risks may include:  Infection.  Air getting into the catheter and bloodstream. Supplies needed:  A clean pair of gloves.  A disinfecting wipe. Use an alcohol wipe, chlorhexidine wipe, or iodine wipe as told by your health care provider.  A 10 mL syringe that has been prefilled with saline solution.  An empty 10 mL syringe, if a substance called heparin was injected into your catheter. How to flush your catheter When you flush your catheter, make sure you follow any specific instructions from your health care provider or the manufacturer. These are general guidelines. Flushing your catheter before use If there is heparin in your catheter: 1. Wash your hands with soap and water. 2. Put on gloves. 3. Scrub the injection cap for a minimum of 15 seconds with a disinfecting wipe. 4. Unclamp the catheter. 5. Attach the empty syringe to the injection cap. 6. Pull the syringe plunger back and withdraw 10 mL of blood. 7. Place the syringe into an appropriate waste container. 8. Scrub the injection cap for 15 seconds with a disinfecting wipe. 9. Attach the prefilled syringe to the injection cap. 10. Flush the catheter by pushing the plunger forward until all the liquid from the syringe is in the catheter. 11. Remove the syringe from the injection cap. 12. Clamp the catheter. If there is no heparin in your catheter: 1. Wash your hands with soap and water. 2. Put on gloves. 3. Scrub the injection cap for 15 seconds with a disinfecting wipe. 4. Unclamp the catheter. 5. Attach the prefilled syringe to the injection cap. 6. Flush the catheter by pushing the plunger forward until 5 mL of the liquid from the syringe is in the catheter. 7. Pull back on  the syringe until you see blood in the catheter. 8. If you have been asked to collect any blood, follow your health care provider's instructions. Otherwise, flush the catheter with the rest of the solution from the syringe. 9. Remove the syringe from the injection cap. 10. Clamp the catheter.  Flushing your catheter after use 1. Wash your hands with soap and water. 2. Put on gloves. 3. Scrub the injection cap for 15 seconds with a disinfecting wipe. 4. Unclamp the catheter. 5. Attach the prefilled syringe to the injection cap. 6. Flush the catheter by pushing the plunger forward until all of the liquid from the syringe is in the catheter. 7. Remove the syringe from the injection cap. 8. Clamp the catheter. Problems and solutions  If blood cannot be completely cleared from the injection cap, you may need to have the injection cap replaced.  If the catheter is difficult to flush, use the pulsing method. The pulsing method involves pushing only a few milliliters of solution into the catheter at a time and pausing between pushes.  If you do not see blood in the catheter when you pull back on the syringe, change your body position, such as by raising your arms above your head. Take a deep breath and cough. Then, pull back on the syringe. If you still do not see blood, flush the catheter with a small amount of solution. Then, change positions again and take a breath or cough. Pull back on the syringe again. If you still do not see   blood, finish flushing the catheter and contact your health care provider. Do not use your catheter until your health care provider says it is okay. General tips  Have someone help you flush your catheter, if possible.  Do not force fluid through your catheter.  Do not use a syringe that is larger or smaller than 10 mL. Using a smaller syringe can make the catheter burst.  Do not use your catheter without flushing it first if it has heparin in it. Contact a health  care provider if:  You cannot see any blood in the catheter when you flush it before using it.  Your catheter is difficult to flush. Get help right away if:  You cannot flush the catheter.  The catheter leaks when you flush it or when there is fluid in it.  There are cracks or breaks in the catheter. Summary  It is important to flush your tunneled central venous catheter each time you use it, both before and after you use it.  Scrub the injection cap for 15 seconds with a disinfecting wipe before and after you flush it.  When you flush your catheter, make sure you follow any specific instructions from your health care provider or the manufacturer.  Get help right away if you cannot flush the catheter. This information is not intended to replace advice given to you by your health care provider. Make sure you discuss any questions you have with your health care provider. Document Released: 01/27/2011 Document Revised: 04/25/2018 Document Reviewed: 04/25/2018 Elsevier Patient Education  2020 [employer]  

## 2018-11-11 LAB — ERYTHROPOIETIN: Erythropoietin: 7.5 m[IU]/mL (ref 2.6–18.5)

## 2018-11-12 ENCOUNTER — Telehealth: Payer: Self-pay | Admitting: Hematology & Oncology

## 2018-11-12 LAB — IRON AND TIBC
Iron: 99 ug/dL (ref 41–142)
Saturation Ratios: 43 % (ref 21–57)
TIBC: 231 ug/dL — ABNORMAL LOW (ref 236–444)
UIBC: 132 ug/dL (ref 120–384)

## 2018-11-12 LAB — FERRITIN: Ferritin: 178 ng/mL (ref 11–307)

## 2018-11-12 NOTE — Telephone Encounter (Signed)
Spoke with patient to confirm 10/16 appt per 9/18 LOS

## 2018-11-13 NOTE — Progress Notes (Signed)
HPI: FU chest pain.Patient seen Paula [name] 6/18 with chest pain and palpitations.Patient had a nuclear study in [facility] October 2013. Her ejection fraction was 74% and no ischemia. Monitor June 2018 showed sinus to sinus tachycardia with rare PVC and brief run of SVT. Echocardiogram June 2018 showed normal LV function and mild mitral regurgitation.Since last seen,there is no dyspnea, chest pain or syncope.  Occasional brief palpitations reasonably well controlled with carvedilol.  Current Outpatient Medications  Medication Sig Dispense Refill  . carvedilol (COREG) 6.25 MG tablet Take 6.25 mg by mouth 2 (two) times daily.     . fluticasone (FLONASE) 50 MCG/ACT nasal spray   3  . HYDROcodone-acetaminophen (NORCO/VICODIN) 5-325 MG tablet Take 1 tablet by mouth every 6 (six) hours as needed for moderate pain. 60 tablet 0  . lidocaine-prilocaine (EMLA) cream Apply 1 application topically as needed. 30 g 6  . omeprazole (PRILOSEC) 20 MG capsule Take 20 mg by mouth daily.    . promethazine (PHENERGAN) 25 MG tablet Take 1 tablet (25 mg total) by mouth every 6 (six) hours as needed for nausea or vomiting. 30 tablet 1  . vitamin B-12 (CYANOCOBALAMIN) 500 MCG tablet Take 2,000 mcg by mouth daily.     No current facility-administered medications for this visit.    Facility-Administered Medications Ordered in Other Visits  Medication Dose Route Frequency Provider Last Rate Last Dose  . sodium chloride flush (NS) 0.9 % injection 10 mL  10 mL Intravenous PRN Volanda Napoleon, MD   10 mL at 01/03/18 1316     Past Medical History:  Diagnosis Date  . Arthritis    back  . Costochondritis   . Esophageal reflux    On omeprazole  . Family history of cancer   . Headache(784.0) 2011   Migraines  . Heart murmur    never caused any problems  . History of hiatal hernia   . History of radiation therapy 05/12/14- 06/17/14   post-op gastric region/ nodes/ 45 Gy in 25 fractions.   . Hypertension     started around 2011  . Iron deficiency anemia due to chronic blood loss 05/25/2015  . 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 On antineoplastic chemotherapy    5-FU and leucovorin  . Ovarian cyst   . Pneumonia    x 1  . Pulmonary emboli ([place]) 2015  . Stomach cancer ([place]) dx'd 08/2013  . Tuberculosis    ? [id number]  . Wears partial dentures    upper dentures    Past Surgical History:  Procedure Laterality Date  . ABDOMINAL HYSTERECTOMY    . CESAREAN SECTION     x 2  . ESOPHAGOGASTRODUODENOSCOPY N/A 09/16/2013   Procedure: ESOPHAGOGASTRODUODENOSCOPY (EGD);  Surgeon: Wonda Horner, MD;  Location: [facility];  Service: Endoscopy;  Laterality: N/A;  . IR CV LINE INJECTION  08/30/2017  . IR FLUORO GUIDE PORT INSERTION RIGHT  03/21/2017  . IR US GUIDE VASC ACCESS RIGHT  03/21/2017  . LAPAROSCOPY N/A 09/20/2013   Procedure: DIAGNOSTIC LAPAROSCOPY,DISTAL GASTRECTOMY AND FEEDING GASTROJEJUNOSTOMY;  Surgeon: [name], MD;  Location: [facility];  Service: General;  Laterality: N/A;  . OOPHORECTOMY     Around 1985 left ovary removal  . PORTACATH PLACEMENT Left 11/18/2013   Procedure: INSERTION PORT-A-CATH;  Surgeon: [name], MD;  Location: [facility];  Service: General;  Laterality: Left;  . TONSILLECTOMY     Around 1994  . TUBAL LIGATION    . UNILATERAL SALPINGECTOMY  Left 11/09/2017   Procedure: UNILATERAL SALPINGECTOMY;  Surgeon: Osborne Oman, MD;  Location: [facility];  Service: Gynecology;  Laterality: Left;  Marland Kitchen VAGINAL HYSTERECTOMY Left 11/09/2017   Procedure: HYSTERECTOMY VAGINAL WITH LEFT SALPINGECTOMY;  Surgeon: Osborne Oman, MD;  Location: [facility];  Service: Gynecology;  Laterality: Left;    Social History   Socioeconomic History  . Marital status: Legally Separated    Spouse name: Cornelia Copa  . Number of children: 2  . Years of education: Not on file  . Highest education level: Not on file  Occupational History    Employer: [employer]  Social Needs  . Financial  resource strain: Not on file  . Food insecurity    Worry: Not on file    Inability: Not on file  . Transportation needs    Medical: Not on file    Non-medical: Not on file  Tobacco Use  . Smoking status: Former Smoker    Packs/day: 1.00    Years: 16.00    Pack years: 16.00    Types: Cigarettes    Quit date: 02/21/1998    Years since quitting: 20.7  . Smokeless tobacco: Never Used  . Tobacco comment: 0.5-1.0 ppd for 16 yrs  Substance and Sexual Activity  . Alcohol use: Yes    Alcohol/week: 0.0 standard drinks    Comment: occasional wine  . Drug use: No  . Sexual activity: Not Currently    Birth control/protection: Surgical    Comment: Depo Inj 09/2017  Lifestyle  . Physical activity    Days per week: Not on file    Minutes per session: Not on file  . Stress: Not on file  Relationships  . Social Herbalist on phone: Not on file    Gets together: Not on file    Attends religious service: Not on file    Active member of club or organization: Not on file    Attends meetings of clubs or organizations: Not on file    Relationship status: Not on file  . Intimate partner violence    Fear of current or ex partner: Not on file    Emotionally abused: Not on file    Physically abused: Not on file    Forced sexual activity: Not on file  Other Topics Concern  . Not on file  Social History Narrative  . Not on file    Family History  Problem Relation Age of Onset  . Hypertension Mother   . Diabetes Mellitus II Mother   . Other Mother        hx of hysterectomy for unspecified reason  . Hypertension Sister   . Hypertension Brother   . Heart failure Maternal Grandmother   . Heart Problems Maternal Grandmother   . Hypertension Maternal Grandfather   . Diabetes Maternal Grandfather   . Colon cancer Paternal Uncle        dx. late 50s-early 60s  . Gastric cancer Cousin 68  . Colon cancer Cousin        dx. early-late 76s; (father also had colon cancer)  . Colon cancer  Paternal Uncle        dx. late 50s-early 60s  . Lung cancer Paternal Uncle        dx. 42s; smoker  . Colon polyps Paternal Uncle        unknown number  . Heart attack Maternal Aunt   . Heart Problems Paternal Aunt   . Heart attack Paternal  Grandfather   . Colon cancer Paternal Grandfather        dx. 50s-60s  . Colon polyps Brother        1 maternal half-brother had approx 3 polyps  . Breast cancer Cousin 39  . Breast cancer Cousin        dx. early 21s or earlier (father had colon cancer)  . Lung cancer Other        PGF's brothers (x4); dx. later in life; lim info    ROS: Recent weight loss but no fevers or chills, productive cough, hemoptysis, dysphasia, odynophagia, melena, hematochezia, dysuria, hematuria, rash, seizure activity, orthopnea, PND, pedal edema, claudication. Remaining systems are negative.  Physical Exam: Well-developed well-nourished in no acute distress.  Skin is warm and dry.  HEENT is normal.  Neck is supple.  Chest is clear to auscultation with normal expansion.  Cardiovascular exam is regular rate and rhythm.  Abdominal exam nontender or distended. No masses palpated. Extremities show no edema. neuro grossly intact  ECG-sinus rhythm at a rate of 66, no ST changes.  Personally reviewed  A/P  1 atypical chest pain-no recent symptoms.  Electrocardiogram shows no ST changes.  No plans for further ischemia evaluation at this point.  2 hypertension-blood pressure controlled.  Continue present medications and follow.  3 palpitations-symptoms are controlled at present.  Continue beta-blocker at present dose.  4 history of pulmonary embolus  5 mitral regurgitation-mild on most recent echocardiogram.  Kirk Ruths, MD

## 2018-11-14 ENCOUNTER — Other Ambulatory Visit: Payer: Self-pay

## 2018-11-14 DIAGNOSIS — C163 Malignant neoplasm of pyloric antrum: Secondary | ICD-10-CM

## 2018-11-14 DIAGNOSIS — C169 Malignant neoplasm of stomach, unspecified: Secondary | ICD-10-CM

## 2018-11-14 MED ORDER — HYDROCODONE-ACETAMINOPHEN 5-325 MG PO TABS: 1.0000 | ORAL_TABLET | Freq: Four times a day (QID) | ORAL | 0 refills | Status: DC | PRN

## 2018-11-15 ENCOUNTER — Ambulatory Visit (INDEPENDENT_AMBULATORY_CARE_PROVIDER_SITE_OTHER): Payer: Medicaid Other | Admitting: Cardiology

## 2018-11-15 ENCOUNTER — Other Ambulatory Visit: Payer: Self-pay

## 2018-11-15 ENCOUNTER — Other Ambulatory Visit: Payer: Self-pay | Admitting: *Deleted

## 2018-11-15 ENCOUNTER — Encounter: Payer: Self-pay | Admitting: Cardiology

## 2018-11-15 VITALS — BP 104/66 | HR 66 | Temp 97.7°F | Ht 60.0 in | Wt 134.0 lb

## 2018-11-15 DIAGNOSIS — C169 Malignant neoplasm of stomach, unspecified: Secondary | ICD-10-CM

## 2018-11-15 DIAGNOSIS — R0789 Other chest pain: Secondary | ICD-10-CM | POA: Diagnosis not present

## 2018-11-15 DIAGNOSIS — I10 Essential (primary) hypertension: Secondary | ICD-10-CM

## 2018-11-15 DIAGNOSIS — C163 Malignant neoplasm of pyloric antrum: Secondary | ICD-10-CM

## 2018-11-15 DIAGNOSIS — R002 Palpitations: Secondary | ICD-10-CM

## 2018-11-15 MED ORDER — CARVEDILOL 6.25 MG PO TABS: 6.2500 mg | ORAL_TABLET | Freq: Two times a day (BID) | ORAL | 3 refills | Status: DC

## 2018-11-15 MED ORDER — HYDROCODONE-ACETAMINOPHEN 5-325 MG PO TABS: 1.0000 | ORAL_TABLET | Freq: Four times a day (QID) | ORAL | 0 refills | Status: DC | PRN

## 2018-11-15 NOTE — Patient Instructions (Signed)
Medication Instructions:  Refill sent to the pharmacy electronically.  If you need a refill on your cardiac medications before your next appointment, please call your pharmacy.   Lab work: If you have labs (blood work) drawn today and your tests are completely normal, you will receive your results only by: . MyChart Message (if you have MyChart) OR . A paper copy in the mail If you have any lab test that is abnormal or we need to change your treatment, we will call you to review the results.  Follow-Up: At [facility], you and your health needs are our priority.  As part of our continuing mission to provide you with exceptional heart care, we have created designated Provider Care Teams.  These Care Teams include your primary Cardiologist (physician) and Advanced Practice Providers (APPs -  Physician Assistants and Nurse Practitioners) who all work together to provide you with the care you need, when you need it. You will need a follow up appointment in 12 months.  Please call our office 2 months in advance to schedule this appointment.  You may see BRIAN CRENSHAW MD or one of the following Advanced Practice Providers on your designated Care Team:   Luke Kilroy, PA-C Krista Kroeger, PA-C . Callie Goodrich, PA-C     

## 2018-11-16 ENCOUNTER — Other Ambulatory Visit: Payer: Self-pay | Admitting: Hematology & Oncology

## 2018-11-16 MED ORDER — DRONABINOL 5 MG PO CAPS: 5.0000 mg | ORAL_CAPSULE | Freq: Two times a day (BID) | ORAL | 0 refills | Status: DC

## 2018-12-07 ENCOUNTER — Inpatient Hospital Stay: Payer: Medicare Other

## 2018-12-07 ENCOUNTER — Telehealth: Payer: Self-pay | Admitting: Family

## 2018-12-07 ENCOUNTER — Other Ambulatory Visit: Payer: Self-pay

## 2018-12-07 ENCOUNTER — Other Ambulatory Visit: Payer: Self-pay | Admitting: *Deleted

## 2018-12-07 ENCOUNTER — Encounter: Payer: Self-pay | Admitting: Family

## 2018-12-07 ENCOUNTER — Telehealth: Payer: Self-pay | Admitting: *Deleted

## 2018-12-07 ENCOUNTER — Inpatient Hospital Stay: Payer: Medicare Other | Attending: Hematology & Oncology | Admitting: Family

## 2018-12-07 VITALS — BP 114/73 | HR 72 | Temp 97.1°F | Resp 19 | Wt 128.4 lb

## 2018-12-07 DIAGNOSIS — Z85028 Personal history of other malignant neoplasm of stomach: Secondary | ICD-10-CM | POA: Insufficient documentation

## 2018-12-07 DIAGNOSIS — C169 Malignant neoplasm of stomach, unspecified: Secondary | ICD-10-CM

## 2018-12-07 DIAGNOSIS — R112 Nausea with vomiting, unspecified: Secondary | ICD-10-CM | POA: Insufficient documentation

## 2018-12-07 DIAGNOSIS — Z79899 Other long term (current) drug therapy: Secondary | ICD-10-CM | POA: Insufficient documentation

## 2018-12-07 DIAGNOSIS — R634 Abnormal weight loss: Secondary | ICD-10-CM | POA: Diagnosis not present

## 2018-12-07 DIAGNOSIS — Z452 Encounter for adjustment and management of vascular access device: Secondary | ICD-10-CM | POA: Insufficient documentation

## 2018-12-07 DIAGNOSIS — K219 Gastro-esophageal reflux disease without esophagitis: Secondary | ICD-10-CM

## 2018-12-07 DIAGNOSIS — Z95828 Presence of other vascular implants and grafts: Secondary | ICD-10-CM

## 2018-12-07 LAB — CMP (CANCER CENTER ONLY)
ALT: 12 U/L (ref 0–44)
AST: 14 U/L — ABNORMAL LOW (ref 15–41)
Albumin: 4 g/dL (ref 3.5–5.0)
Alkaline Phosphatase: 72 U/L (ref 38–126)
Anion gap: 6 (ref 5–15)
BUN: 13 mg/dL (ref 6–20)
CO2: 28 mmol/L (ref 22–32)
Calcium: 9 mg/dL (ref 8.9–10.3)
Chloride: 102 mmol/L (ref 98–111)
Creatinine: 0.88 mg/dL (ref 0.44–1.00)
GFR, Est AFR Am: >60 mL/min (ref >60)
GFR, Estimated: >60 mL/min (ref >60)
Glucose, Bld: 142 mg/dL — ABNORMAL HIGH (ref 70–99)
Potassium: 4.2 mmol/L (ref 3.5–5.1)
Sodium: 136 mmol/L (ref 135–145)
Total Bilirubin: 0.8 mg/dL (ref 0.3–1.2)
Total Protein: 6.3 g/dL — ABNORMAL LOW (ref 6.5–8.1)

## 2018-12-07 LAB — CBC WITH DIFFERENTIAL (CANCER CENTER ONLY)
Abs Immature Granulocytes: 0.01 10*3/uL (ref 0.00–0.07)
Basophils Absolute: 0 10*3/uL (ref 0.0–0.1)
Basophils Relative: 0 %
Eosinophils Absolute: 0.1 10*3/uL (ref 0.0–0.5)
Eosinophils Relative: 2 %
HCT: 34.3 % — ABNORMAL LOW (ref 36.0–46.0)
Hemoglobin: 10.6 g/dL — ABNORMAL LOW (ref 12.0–15.0)
Immature Granulocytes: 0 %
Lymphocytes Relative: 26 %
Lymphs Abs: 1.3 10*3/uL (ref 0.7–4.0)
MCH: 28.5 pg (ref 26.0–34.0)
MCHC: 30.9 g/dL (ref 30.0–36.0)
MCV: 92.2 fL (ref 80.0–100.0)
Monocytes Absolute: 0.4 10*3/uL (ref 0.1–1.0)
Monocytes Relative: 8 %
Neutro Abs: 3 10*3/uL (ref 1.7–7.7)
Neutrophils Relative %: 64 %
Platelet Count: 204 10*3/uL (ref 150–400)
RBC: 3.72 MIL/uL — ABNORMAL LOW (ref 3.87–5.11)
RDW: 14.1 % (ref 11.5–15.5)
WBC Count: 4.8 10*3/uL (ref 4.0–10.5)
nRBC: 0 % (ref 0.0–0.2)

## 2018-12-07 LAB — T4, FREE: Free T4: 1.13 ng/dL — ABNORMAL HIGH (ref 0.61–1.12)

## 2018-12-07 MED ORDER — SODIUM CHLORIDE 0.9% FLUSH
10.0000 mL | INTRAVENOUS | Status: DC | PRN
  Administered 2018-12-07: 10 mL via INTRAVENOUS
  Filled 2018-12-07: qty 10

## 2018-12-07 MED ORDER — HEPARIN SOD (PORK) LOCK FLUSH 100 UNIT/ML IV SOLN
500.0000 [IU] | Freq: Once | INTRAVENOUS | Status: AC
  Administered 2018-12-07: 500 [IU] via INTRAVENOUS
  Filled 2018-12-07: qty 5

## 2018-12-07 MED ORDER — PANTOPRAZOLE SODIUM 40 MG PO TBEC: 40.0000 mg | DELAYED_RELEASE_TABLET | Freq: Two times a day (BID) | ORAL | 3 refills | Status: DC

## 2018-12-07 MED ORDER — LIDOCAINE-PRILOCAINE 2.5-2.5 % EX CREA: 1.0000 "application " | TOPICAL_CREAM | CUTANEOUS | 6 refills | Status: DC | PRN

## 2018-12-07 MED FILL — LIDOCAINE-PRILOCAINE CREAM: 2.5-2.5 | 14 days supply | Qty: 30 | Fill #0

## 2018-12-07 MED FILL — PANTOPRAZOLE SOD DR 40 MG T: 40 | 30 days supply | Qty: 60 | Fill #0

## 2018-12-07 NOTE — Patient Instructions (Signed)
Implanted Port Insertion, Care After This sheet gives you information about how to care for yourself after your procedure. Your health care provider may also give you more specific instructions. If you have problems or questions, contact your health care provider. What can I expect after the procedure? After the procedure, it is common to have:  Discomfort at the port insertion site.  Bruising on the skin over the port. This should improve over 3-4 days. Follow these instructions at home: Port care  After your port is placed, you will get a manufacturer's information card. The card has information about your port. Keep this card with you at all times.  Take care of the port as told by your health care provider. Ask your health care provider if you or a family member can get training for taking care of the port at home. A home health care nurse may also take care of the port.  Make sure to remember what type of port you have. Incision care      Follow instructions from your health care provider about how to take care of your port insertion site. Make sure you: ? Wash your hands with soap and water before and after you change your bandage (dressing). If soap and water are not available, use hand sanitizer. ? Change your dressing as told by your health care provider. ? Leave stitches (sutures), skin glue, or adhesive strips in place. These skin closures may need to stay in place for 2 weeks or longer. If adhesive strip edges start to loosen and curl up, you may trim the loose edges. Do not remove adhesive strips completely unless your health care provider tells you to do that.  Check your port insertion site every day for signs of infection. Check for: ? Redness, swelling, or pain. ? Fluid or blood. ? Warmth. ? Pus or a bad smell. Activity  Return to your normal activities as told by your health care provider. Ask your health care provider what activities are safe for you.  Do not  lift anything that is heavier than 10 lb (4.5 kg), or the limit that you are told, until your health care provider says that it is safe. General instructions  Take over-the-counter and prescription medicines only as told by your health care provider.  Do not take baths, swim, or use a hot tub until your health care provider approves. Ask your health care provider if you may take showers. You may only be allowed to take sponge baths.  Do not drive for 24 hours if you were given a sedative during your procedure.  Wear a medical alert bracelet in case of an emergency. This will tell any health care providers that you have a port.  Keep all follow-up visits as told by your health care provider. This is important. Contact a health care provider if:  You cannot flush your port with saline as directed, or you cannot draw blood from the port.  You have a fever or chills.  You have redness, swelling, or pain around your port insertion site.  You have fluid or blood coming from your port insertion site.  Your port insertion site feels warm to the touch.  You have pus or a bad smell coming from the port insertion site. Get help right away if:  You have chest pain or shortness of breath.  You have bleeding from your port that you cannot control. Summary  Take care of the port as told by your health   care provider. Keep the manufacturer's information card with you at all times.  Change your dressing as told by your health care provider.  Contact a health care provider if you have a fever or chills or if you have redness, swelling, or pain around your port insertion site.  Keep all follow-up visits as told by your health care provider. This information is not intended to replace advice given to you by your health care provider. Make sure you discuss any questions you have with your health care provider. Document Released: 11/28/2012 Document Revised: 09/05/2017 Document Reviewed: 09/05/2017  Elsevier Patient Education  2020 Elsevier Inc.  

## 2018-12-07 NOTE — Telephone Encounter (Signed)
Spoke with patient to confirm appts per 10/16 LOS

## 2018-12-07 NOTE — Progress Notes (Signed)
Hematology and Oncology Follow Up Visit  Paula [name] [id number] 11-13-1965 53 y.o. 12/07/2018   Principle Diagnosis:  Stage IIA (T1a, N2, M0) gastric adenocarcinoma diagnosed in July of 2015 Intermittent iron deficiency anemia  Past Therapy: Status post diagnostic laparoscopy, distal gastrectomy with billroth II anastamosis and feeding gastrojejunostomy tube under the care of Dr. Barry Dienes on 09/20/2013 Adjuvant systemic chemotherapy with 5-FU 600 mg/M2 with leucovorin on days 1, 8, 15, 22, 29 and 36 every 8 weeks Adjuvant concurrent chemoradiation with Xeloda 650 mg/M2 by mouth twice a day for 5 days every week with radiation. The patient completed the course of radiotherapy but she declined to take Xeloda during her radiation  Current Therapy:   Observation IV iron as indicated   Interim History:  Paula [name] is here today for follow-up. She is down another 6 lbs since last month.  She is taking Marinol as prescribed and it does make her hungry. However, when she eats she become nauseated and vomits.  She states that she is having a lot of GERD despite taking Prilosec before meals.  Her bowel sounds are decreased throughout. She states that her bowels are moving regularly.  She has had mid abdominal pain.  No fever, chills, cough, rash, dizziness, SOB, chest pain, palpitations or changes in bowel or bladder habits.  No swelling, tenderness, numbness or tingling in her extremities.  No falls or syncope.  No episodes of bleeding. No bruising or petechiae.   ECOG Performance Status: 1 - Symptomatic but completely ambulatory  Medications:  Allergies as of 12/07/2018      Reactions   Tape Itching   Ok with adhesive tape, ok with paper tape per patient      Medication List       Accurate as of December 07, 2018 10:30 AM. If you have any questions, ask your nurse or doctor.        carvedilol 6.25 MG tablet Commonly known as: COREG Take 1 tablet (6.25 mg  total) by mouth 2 (two) times daily.   dronabinol 5 MG capsule Commonly known as: MARINOL Take 1 capsule (5 mg total) by mouth 2 (two) times daily before lunch and supper.   fluticasone 50 MCG/ACT nasal spray Commonly known as: FLONASE   HYDROcodone-acetaminophen 5-325 MG tablet Commonly known as: NORCO/VICODIN Take 1 tablet by mouth every 6 (six) hours as needed for moderate pain.   lidocaine-prilocaine cream Commonly known as: EMLA Apply 1 application topically as needed.   omeprazole 20 MG capsule Commonly known as: PRILOSEC Take 20 mg by mouth daily.   promethazine 25 MG tablet Commonly known as: PHENERGAN Take 1 tablet (25 mg total) by mouth every 6 (six) hours as needed for nausea or vomiting.   vitamin B-12 500 MCG tablet Commonly known as: CYANOCOBALAMIN Take 2,000 mcg by mouth daily.       Allergies:  Allergies  Allergen Reactions  . Tape Itching    Ok with adhesive tape, ok with paper tape per patient    Past Medical History, Surgical history, Social history, and Family History were reviewed and updated.  Review of Systems: All other 10 point review of systems is negative.   Physical Exam:  weight is 128 lb 6.4 oz (58.2 kg). Her temporal temperature is 97.1 F (36.2 C) (abnormal). Her blood pressure is 114/73 and her pulse is 72. Her respiration is 19 and oxygen saturation is 100%.   Wt Readings from Last 3 Encounters:  12/07/18 128 lb 6.4 oz (58.2  kg)  11/15/18 134 lb (60.8 kg)  11/09/18 133 lb (60.3 kg)    Ocular: Sclerae unicteric, pupils equal, round and reactive to light Ear-nose-throat: Oropharynx clear, dentition fair Lymphatic: No cervical or supraclavicular adenopathy Lungs no rales or rhonchi, good excursion bilaterally Heart regular rate and rhythm, no murmur appreciated Abd soft, mid abdominal tenderness on exam, decreased bowel sounds MSK no focal spinal tenderness, no joint edema Neuro: non-focal, well-oriented, appropriate affect  Breasts: Deferred   Lab Results  Component Value Date   WBC 5.7 11/09/2018   HGB 12.0 11/09/2018   HCT 38.7 11/09/2018   MCV 90.6 11/09/2018   PLT 153 11/09/2018   Lab Results  Component Value Date   FERRITIN 178 11/09/2018   IRON 99 11/09/2018   TIBC 231 (L) 11/09/2018   UIBC 132 11/09/2018   IRONPCTSAT 43 11/09/2018   Lab Results  Component Value Date   RBC 4.27 11/09/2018   No results found for: Nils Pyle [facility] Lab Results  Component Value Date   IGGSERUM 1,273 05/21/2015   IGMSERUM 40 05/21/2015   No results found for: Odetta Pink, SPEI   Chemistry      Component Value Date/Time   NA 136 11/09/2018 1306   NA 140 01/26/2017 1002   NA 140 09/24/2015 0953   K 4.6 11/09/2018 1306   K 3.5 01/26/2017 1002   K 4.0 09/24/2015 0953   CL 102 11/09/2018 1306   CL 106 01/26/2017 1002   CO2 29 11/09/2018 1306   CO2 26 01/26/2017 1002   CO2 23 09/24/2015 0953   BUN 12 11/09/2018 1306   BUN 6 (L) 01/26/2017 1002   BUN 8.6 09/24/2015 0953   CREATININE 1.00 11/09/2018 1306   CREATININE 1.1 01/26/2017 1002   CREATININE 1.0 09/24/2015 0953      Component Value Date/Time   CALCIUM 9.4 11/09/2018 1306   CALCIUM 8.9 01/26/2017 1002   CALCIUM 9.6 09/24/2015 0953   ALKPHOS 81 11/09/2018 1306   ALKPHOS 68 01/26/2017 1002   ALKPHOS 99 09/24/2015 0953   AST 15 11/09/2018 1306   AST 17 09/24/2015 0953   ALT 12 11/09/2018 1306   ALT 17 01/26/2017 1002   ALT 11 09/24/2015 0953   BILITOT 0.5 11/09/2018 1306   BILITOT 0.83 09/24/2015 0953       Impression and Plan: Paula [name] is a pleasant53 yo [place] American female with history of stage II gastric cancer and partial gastrectomy followed by adjuvant chemo and radiation. She is now having significant weight loss with n/v, (down 18 lbs since August).  We will get a CT scan of the abdomen and pelvis.  We will have her start taking Protonix  40 mg PO BID for GERD. Hopefully this will help with her n/v.  Referral placed for [facility] per her request (closer to home) for further work up.  We will go ahead and plan to see her back in another 6 weeks.  She will contact our office with any questions or concerns. We can certainly see her sooner if needed.   Laverna Peace, NP 10/16/202010:30 AM

## 2018-12-07 NOTE — Telephone Encounter (Signed)
Call placed to patient to notify him per order of S. Wood[place]ine NP that "we are going to get a CT of the abdomen and pelvis next week."  Pt appreciative of call and has no questions or concerns at this time.

## 2018-12-10 ENCOUNTER — Emergency Department (HOSPITAL_BASED_OUTPATIENT_CLINIC_OR_DEPARTMENT_OTHER)
Admission: EM | Admit: 2018-12-10 | Discharge: 2018-12-10 | Disposition: A | Discharge status: Home / Self Care | Payer: Medicare Other | Attending: Emergency Medicine | Admitting: Emergency Medicine

## 2018-12-10 ENCOUNTER — Other Ambulatory Visit: Payer: Self-pay

## 2018-12-10 ENCOUNTER — Encounter (HOSPITAL_BASED_OUTPATIENT_CLINIC_OR_DEPARTMENT_OTHER): Payer: Self-pay | Admitting: *Deleted

## 2018-12-10 DIAGNOSIS — Z79899 Other long term (current) drug therapy: Secondary | ICD-10-CM | POA: Insufficient documentation

## 2018-12-10 DIAGNOSIS — R011 Cardiac murmur, unspecified: Secondary | ICD-10-CM | POA: Diagnosis not present

## 2018-12-10 DIAGNOSIS — J029 Acute pharyngitis, unspecified: Secondary | ICD-10-CM | POA: Diagnosis present

## 2018-12-10 DIAGNOSIS — Z91048 Other nonmedicinal substance allergy status: Secondary | ICD-10-CM | POA: Insufficient documentation

## 2018-12-10 DIAGNOSIS — Z87891 Personal history of nicotine dependence: Secondary | ICD-10-CM | POA: Diagnosis not present

## 2018-12-10 DIAGNOSIS — I10 Essential (primary) hypertension: Secondary | ICD-10-CM | POA: Diagnosis not present

## 2018-12-10 DIAGNOSIS — J028 Acute pharyngitis due to other specified organisms: Secondary | ICD-10-CM | POA: Diagnosis not present

## 2018-12-10 DIAGNOSIS — B9789 Other viral agents as the cause of diseases classified elsewhere: Secondary | ICD-10-CM | POA: Diagnosis not present

## 2018-12-10 DIAGNOSIS — Z85028 Personal history of other malignant neoplasm of stomach: Secondary | ICD-10-CM | POA: Diagnosis not present

## 2018-12-10 LAB — GROUP A STREP BY PCR: Group A Strep by PCR: NOT DETECTED

## 2018-12-10 LAB — TSH: TSH: 1.212 u[IU]/mL (ref 0.308–3.960)

## 2018-12-10 MED ORDER — FLUTICASONE PROPIONATE 50 MCG/ACT NA SUSP: 1.0000 | Freq: Every day | NASAL | 2 refills | Status: AC

## 2018-12-10 MED ORDER — LIDOCAINE VISCOUS HCL 2 % MT SOLN: 15.0000 mL | OROMUCOSAL | 0 refills | Status: DC | PRN

## 2018-12-10 MED FILL — LIDOCAINE 2% VISCOUS SOLN: 2 | 7 days supply | Qty: 100 | Fill #0

## 2018-12-10 MED FILL — FLUTICASONE PROP 50 MCG SPR: 50 | 60 days supply | Qty: 16 | Fill #0

## 2018-12-10 NOTE — ED Triage Notes (Signed)
sore throat and left ear pain x 3  Days   Worse in evening and night

## 2018-12-10 NOTE — ED Provider Notes (Signed)
[place] EMERGENCY DEPARTMENT Provider Note   CSN: [id number] Arrival date & time: 12/10/18  0911     History   Chief Complaint Chief Complaint  Patient presents with  . Sore Throat    HPI Paula [name] is a 53 y.o. female with a past medical history of gastric carcinoma status post resection and chemotherapy currently in remission, hypertension, who presents to ED for evaluation 3-day history of sore throat and left-sided ear pressure.  States the symptoms have been waxing and waning since they began.  She has tried 1 dose of TheraFlu with some improvement in her symptoms.  She denies sick contacts with similar symptoms.  States that she has had postnasal drainage that she is spitting up.  She had one episode of vomiting but this has resolved.  Denies any fevers, cough, shortness of breath, rhinorrhea, drainage from the ear, trauma to the area.     HPI  Past Medical History:  Diagnosis Date  . Arthritis    back  . Costochondritis   . Esophageal reflux    On omeprazole  . Family history of cancer   . Headache(784.0) 2011   Migraines  . Heart murmur    never caused any problems  . History of hiatal hernia   . History of radiation therapy 05/12/14- 06/17/14   post-op gastric region/ nodes/ 45 Gy in 25 fractions.   . Hypertension    started around 2011  . Iron deficiency anemia due to chronic blood loss 05/25/2015  . 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 On antineoplastic chemotherapy    5-FU and leucovorin  . Ovarian cyst   . Pneumonia    x 1  . Pulmonary emboli ([place]) 2015  . Stomach cancer ([place]) dx'd 08/2013  . Tuberculosis    ? [id number]  . Wears partial dentures    upper dentures    Patient Active Problem List   Diagnosis Date Noted  . Right leg pain 10/15/2018  . Concussion with no loss of consciousness 09/10/2018  . Muscle pain 09/10/2018  . S/P vaginal hysterectomy 11/09/2017  . Left knee pain 04/25/2017  . Iron deficiency anemia due to  chronic blood loss 05/25/2015  . Healed or old pulmonary embolism 03/31/2015  . Genetic testing 01/23/2015  . Insomnia 06/30/2014  . Neutropenia ([place]) 02/24/2014  . Neuropathy due to chemotherapeutic drug ([place]) 12/18/2013  . Hypoalbuminemia 12/18/2013  . Malfunction of device 12/18/2013  . Leg edema 12/18/2013  . Neoplasm related pain 12/18/2013  . Other pancytopenia ([place]) 12/13/2013  . Hyponatremia 12/12/2013  . Hypokalemia 12/12/2013  . Weight loss 12/04/2013  . Abnormal loss of weight 12/04/2013  . Family history of cancer   . Cancer of antrum of stomach ([place]) 11/07/2013  . 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 Palliative care encounter 11/04/2013  . Acute kidney injury ([place]) 11/03/2013  . Chronic low back pain 10/31/2013  . 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 Constipation, chronic 10/28/2013  . Protein-calorie malnutrition, severe ([place]) 10/23/2013  . Intractable nausea and vomiting 10/22/2013  . Intractable vomiting 10/22/2013  . 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 Carcinoma of stomach ([place]) 09/18/2013  . Antral ulcer 09/16/2013  . Migraine headache 09/14/2013  . PTSD (post-traumatic stress disorder) 08/19/2013  . Left knee injury 07/30/2013  . Mitral regurgitation 02/15/2013  . Chest pain 01/23/2012  . Essential hypertension 01/23/2012  . Murmur 01/23/2012    Past Surgical History:  Procedure Laterality Date  . ABDOMINAL HYSTERECTOMY    . CESAREAN SECTION     x 2  . ESOPHAGOGASTRODUODENOSCOPY N/A 09/16/2013   Procedure: ESOPHAGOGASTRODUODENOSCOPY (EGD);  Surgeon: Wonda Horner, MD;  Location: [facility];  Service: Endoscopy;  Laterality: N/A;  . IR CV LINE INJECTION  08/30/2017  . IR FLUORO GUIDE PORT INSERTION RIGHT  03/21/2017  . IR US GUIDE VASC ACCESS RIGHT  03/21/2017  . LAPAROSCOPY N/A 09/20/2013   Procedure: DIAGNOSTIC LAPAROSCOPY,DISTAL GASTRECTOMY AND FEEDING  GASTROJEJUNOSTOMY;  Surgeon: [name], MD;  Location: [facility];  Service: General;  Laterality: N/A;  . OOPHORECTOMY     Around 1985 left ovary removal  . PORTACATH PLACEMENT Left 11/18/2013   Procedure: INSERTION PORT-A-CATH;  Surgeon: [name], MD;  Location: [facility];  Service: General;  Laterality: Left;  . TONSILLECTOMY     Around 1994  . TUBAL LIGATION    . UNILATERAL SALPINGECTOMY Left 11/09/2017   Procedure: UNILATERAL SALPINGECTOMY;  Surgeon: Osborne Oman, MD;  Location: [facility];  Service: Gynecology;  Laterality: Left;  Marland Kitchen VAGINAL HYSTERECTOMY Left 11/09/2017   Procedure: HYSTERECTOMY VAGINAL WITH LEFT SALPINGECTOMY;  Surgeon: Osborne Oman, MD;  Location: [facility];  Service: Gynecology;  Laterality: Left;     OB History    Gravida  2   Para  2   Term  1   Preterm  1   AB      Living  2     SAB      TAB      Ectopic      Multiple      Live Births  2            Home Medications    Prior to Admission medications   Medication Sig Start Date End Date Taking? Authorizing Provider  carvedilol (COREG) 6.25 MG tablet Take 1 tablet (6.25 mg total) by mouth 2 (two) times daily. 11/15/18   Lelon Perla, MD  dronabinol (MARINOL) 5 MG capsule Take 1 capsule (5 mg total) by mouth 2 (two) times daily before lunch and supper. 11/16/18   Volanda Napoleon, MD  fluticasone (FLONASE) 50 MCG/ACT nasal spray Place 1 spray into both nostrils daily. 12/10/18   Ramaj Frangos, PA-C  HYDROcodone-acetaminophen (NORCO/VICODIN) 5-325 MG tablet Take 1 tablet by mouth every 6 (six) hours as needed for moderate pain. 11/15/18   Volanda Napoleon, MD  lidocaine (XYLOCAINE) 2 % solution Use as directed 15 mLs in the mouth or throat as needed for mouth pain. 12/10/18   Blaize Nipper, PA-C  lidocaine-prilocaine (EMLA) cream Apply 1 application topically as needed. 12/07/18   Volanda Napoleon, MD  omeprazole (PRILOSEC) 20 MG capsule Take 20 mg by mouth daily.    [provider]  pantoprazole (PROTONIX) 40 MG tablet Take 1 tablet (40 mg total) by mouth 2 (two) times daily. 12/07/18   [place], Holli Humbles, NP  promethazine (PHENERGAN) 25 MG tablet Take 1 tablet (25 mg total) by mouth every 6 (six) hours as needed for nausea or vomiting. 11/09/18   Volanda Napoleon, MD  vitamin B-12 (CYANOCOBALAMIN) 500 MCG tablet Take 2,000 mcg by mouth daily.    [provider]    Family History Family History  Problem Relation Age of Onset  . Hypertension Mother   . Diabetes Mellitus II Mother   . Other Mother        hx of hysterectomy for unspecified reason  . Hypertension Sister   .  Hypertension Brother   . Heart failure Maternal Grandmother   . Heart Problems Maternal Grandmother   . Hypertension Maternal Grandfather   . Diabetes Maternal Grandfather   . Colon cancer Paternal Uncle        dx. late 50s-early 60s  . Gastric cancer Cousin 21  . Colon cancer Cousin        dx. early-late 33s; (father also had colon cancer)  . Colon cancer Paternal Uncle        dx. late 50s-early 60s  . Lung cancer Paternal Uncle        dx. 8s; smoker  . Colon polyps Paternal Uncle        unknown number  . Heart attack Maternal Aunt   . Heart Problems Paternal Aunt   . Heart attack Paternal Grandfather   . Colon cancer Paternal Grandfather        dx. 50s-60s  . Colon polyps Brother        1 maternal half-brother had approx 3 polyps  . Breast cancer Cousin 38  . Breast cancer Cousin        dx. early 7s or earlier (father had colon cancer)  . Lung cancer Other        PGF's brothers (x4); dx. later in life; lim info    Social History Social History   Tobacco Use  . Smoking status: Former Smoker    Packs/day: 1.00    Years: 16.00    Pack years: 16.00    Types: Cigarettes    Quit date: 02/21/1998    Years since quitting: 20.8  . Smokeless tobacco: Never Used  . Tobacco comment: 0.5-1.0 ppd for 16 yrs  Substance Use Topics  . Alcohol use: Yes    Alcohol/week: 0.0  standard drinks    Comment: occasional wine  . Drug use: No     Allergies   Tape   Review of Systems Review of Systems  Constitutional: Negative for chills and fever.  HENT: Positive for ear pain, postnasal drip and sore throat. Negative for drooling, ear discharge, rhinorrhea, sinus pressure and sinus pain.   Respiratory: Negative for shortness of breath.      Physical Exam Updated Vital Signs BP 105/75   Pulse 92   Temp 98.4 F (36.9 C) (Oral)   Resp 18   Ht 5\' 1"  (1.549 m)   Wt 58.1 kg   LMP 02/28/2016 (Exact Date)   SpO2 99%   BMI 24.19 kg/m   Physical Exam Vitals signs and nursing note reviewed.  Constitutional:      General: She is not in acute distress.    Appearance: She is well-developed. She is not diaphoretic.  HENT:     Head: Normocephalic and atraumatic.     Right Ear: A middle ear effusion is present.     Left Ear: A middle ear effusion is present. Tympanic membrane is not erythematous, retracted or bulging.     Mouth/Throat:     Tonsils: No tonsillar exudate. 1+ on the right. 1+ on the left.     Comments: Bilaterally, symmetrically enlarged tonsils without exudates. Patient does not appear to be in acute distress. No trismus or drooling present. No pooling of secretions. Patient is tolerating secretions and is not in respiratory distress. No neck pain or tenderness to palpation of the neck. Full active and passive range of motion of the neck. No evidence of RPA or PTA. Eyes:     General: No scleral icterus.  Conjunctiva/sclera: Conjunctivae normal.  Neck:     Musculoskeletal: Normal range of motion.  Pulmonary:     Effort: Pulmonary effort is normal. No respiratory distress.  Skin:    Findings: No rash.  Neurological:     Mental Status: She is alert.      ED Treatments / Results  Labs (all labs ordered are listed, but only abnormal results are displayed) Labs Reviewed  GROUP A STREP BY PCR    EKG None  Radiology No results found.   Procedures Procedures (including critical care time)  Medications Ordered in ED Medications - No data to display   Initial Impression / Assessment and Plan / ED Course  I have reviewed the triage vital signs and the nursing notes.  Pertinent labs & imaging results that were available during my care of the patient were reviewed by me and considered in my medical decision making (see chart for details).        Pt rapid strep test negative. Pt is tolerating secretions, not in respiratory distress, no neck pain, no trismus. Presentation not concerning for peritonsillar abscess or spread of infection to deep spaces of the throat; patent airway. Pt will be discharged with lidocaine to swish and spit and Flonase. Ibuprofen or Tylenol as needed for pain/fever. Specific return precautions discussed. Recommended PCP follow up. Pt appears safe for discharge.   Patient is hemodynamically stable, in NAD, and able to ambulate in the ED. Evaluation does not show pathology that would require ongoing emergent intervention or inpatient treatment. I explained the diagnosis to the patient. Pain has been managed and has no complaints prior to discharge. Patient is comfortable with above plan and is stable for discharge at this time. All questions were answered prior to disposition. Strict return precautions for returning to the ED were discussed. Encouraged follow up with PCP.   An After Visit Summary was printed and given to the patient.   Portions of this note were generated with [employer]. Dictation errors may occur despite best attempts at proofreading.  Final Clinical Impressions(s) / ED Diagnoses   Final diagnoses:  Viral pharyngitis    ED Discharge Orders         Ordered    fluticasone (FLONASE) 50 MCG/ACT nasal spray  Daily     12/10/18 1032    lidocaine (XYLOCAINE) 2 % solution  As needed     12/10/18 [address] PA-C 12/10/18 1032    Charlesetta Shanks, MD 12/12/18 [phone]

## 2018-12-10 NOTE — Discharge Instructions (Signed)
Swish and spit lidocaine to help with throat discomfort.  Do not swallow. Return to the ED if you start to experience worsening symptoms, chest pain, shortness of breath, vomiting or coughing up blood.

## 2018-12-12 ENCOUNTER — Telehealth: Payer: Self-pay | Admitting: *Deleted

## 2018-12-12 NOTE — Telephone Encounter (Addendum)
-----   Message from Volanda Napoleon, MD sent at 12/11/2018  6:40 PM EDT ----- Called patient to let  the thyroid is normal!!   Patient has been losing weight.  Had a workup at primary care including blood work.  Mentioned to her about the dietician Dory Peru coming out every other Tuesday  She would like to make an appt to see her.  Message sent to scheduler.

## 2018-12-13 ENCOUNTER — Telehealth: Payer: Self-pay | Admitting: Gastroenterology

## 2018-12-13 NOTE — Telephone Encounter (Signed)
Pt was last seen at [facility] 01/2018. Pt is requesting Dr.Gupta review her records. Records have been placed on Dr.Gupta's desk for review.

## 2018-12-14 ENCOUNTER — Ambulatory Visit (HOSPITAL_BASED_OUTPATIENT_CLINIC_OR_DEPARTMENT_OTHER): Payer: Medicare Other

## 2018-12-18 ENCOUNTER — Other Ambulatory Visit: Payer: Self-pay | Admitting: *Deleted

## 2018-12-18 ENCOUNTER — Telehealth: Payer: Self-pay | Admitting: Cardiology

## 2018-12-18 DIAGNOSIS — C169 Malignant neoplasm of stomach, unspecified: Secondary | ICD-10-CM

## 2018-12-18 DIAGNOSIS — C163 Malignant neoplasm of pyloric antrum: Secondary | ICD-10-CM

## 2018-12-18 MED ORDER — HYDROCODONE-ACETAMINOPHEN 5-325 MG PO TABS: 1.0000 | ORAL_TABLET | Freq: Four times a day (QID) | ORAL | 0 refills | Status: DC | PRN

## 2018-12-18 NOTE — Telephone Encounter (Signed)
Pt c/o of Chest Pain: STAT if CP now or developed within 24 hours  1. Are you having CP right now? Yes, slight chest pain  2. Are you experiencing any other symptoms (ex. SOB, nausea, vomiting, sweating)? Yes nausea and vomiting  3. How long have you been experiencing CP? Ever since Thursday 12/13/18 around 10:00pm  4. Is your CP continuous or coming and going? Coming and going  5. Have you taken Nitroglycerin? Yes  Patient has taken the Covid test and it was negative. ?

## 2018-12-18 NOTE — Telephone Encounter (Signed)
Pt called in with having slight chest pains/aches since last Thursday. Stated she went to ER last Thursday and they sent her home and said it was stressed induced. Pt stated that she had been stressed prior to ER visit with multiple Dr appts and having to take a covid test that came back negative. She says her back and side are also aching. Pt states that the N/V is from her Cancer. Looked at EKG from ER visit and normal. Advised pt be seen. Only available appts are virtual. Scheduled virtual appt with Paula [name] for Thursday. Educated pt that if she contiues to have unrelieved chest pain, SOB or feels like the pain is getting worse without it subsiding to call 911. Pt verbalized understanding.

## 2018-12-19 ENCOUNTER — Inpatient Hospital Stay: Payer: Medicare Other

## 2018-12-19 ENCOUNTER — Encounter (HOSPITAL_BASED_OUTPATIENT_CLINIC_OR_DEPARTMENT_OTHER): Payer: Self-pay

## 2018-12-19 ENCOUNTER — Telehealth: Payer: Self-pay | Admitting: *Deleted

## 2018-12-19 ENCOUNTER — Other Ambulatory Visit: Payer: Self-pay

## 2018-12-19 ENCOUNTER — Ambulatory Visit (HOSPITAL_BASED_OUTPATIENT_CLINIC_OR_DEPARTMENT_OTHER)
Admission: RE | Admit: 2018-12-19 | Discharge: 2018-12-19 | Disposition: A | Discharge status: Home / Self Care | Payer: Medicare Other | Source: Ambulatory Visit | Attending: Family | Admitting: Family

## 2018-12-19 VITALS — BP 102/66 | HR 65 | Temp 97.5°F | Resp 17

## 2018-12-19 DIAGNOSIS — R112 Nausea with vomiting, unspecified: Secondary | ICD-10-CM | POA: Insufficient documentation

## 2018-12-19 DIAGNOSIS — D500 Iron deficiency anemia secondary to blood loss (chronic): Secondary | ICD-10-CM

## 2018-12-19 DIAGNOSIS — C169 Malignant neoplasm of stomach, unspecified: Secondary | ICD-10-CM | POA: Diagnosis present

## 2018-12-19 DIAGNOSIS — K219 Gastro-esophageal reflux disease without esophagitis: Secondary | ICD-10-CM

## 2018-12-19 DIAGNOSIS — Z85028 Personal history of other malignant neoplasm of stomach: Secondary | ICD-10-CM | POA: Diagnosis not present

## 2018-12-19 MED ORDER — IOHEXOL 300 MG/ML  SOLN
100.0000 mL | Freq: Once | INTRAMUSCULAR | Status: AC | PRN
  Administered 2018-12-19: 100 mL via INTRAVENOUS

## 2018-12-19 MED ORDER — HEPARIN SOD (PORK) LOCK FLUSH 100 UNIT/ML IV SOLN
500.0000 [IU] | Freq: Once | INTRAVENOUS | Status: AC | PRN
  Administered 2018-12-19: 500 [IU]
  Filled 2018-12-19: qty 5

## 2018-12-19 MED ORDER — SODIUM CHLORIDE 0.9% FLUSH
10.0000 mL | Freq: Once | INTRAVENOUS | Status: AC | PRN
  Administered 2018-12-19: 10 mL
  Filled 2018-12-19: qty 10

## 2018-12-19 NOTE — Patient Instructions (Signed)

## 2018-12-19 NOTE — Progress Notes (Signed)
Cardiology Office Note   Date:  12/20/2018   ID:  SHARLEY BOHNER, DOB 04/27/1965, MRN [id number]  PCP:  Carmon Ginsberg, FNP  Cardiologist: Dr. Stanford Breed CC: Recurrent chest pain   History of Present Illness: Paula [name] is a 53 y.o. female who presents for ongoing assessment and management of palpitations with history of chest pain.  The patient had a nuclear medicine study at [facility] in 11/2011 and was not found to have ischemia.  Cardiac monitor 07/2016 revealed normal sinus rhythm to sinus tachycardia with rare PVCs and one brief run of SVT.  Echo 07/2016 showed normal LV function with mild mitral regurg.    She also has a history of stomach cancer status post chemotherapy with Port-A-Cath to the right upper chest.  She was last seen in the office by Dr. Stanford Breed on 11/15/2018.  At that time she was asymptomatic, blood pressure was well controlled, no medications were changed and no further planned ischemia evaluation was recommended.  She was continued on carvedilol 12.5 mg twice daily.  Paula [name] is seen today via virtual video visit.  She states that she is having recurrent chest pain, described as pressure and bandlike pain around her chest.  She states it does have some associated shortness of breath.  She reports that she was seen again in the emergency room for this and was told it was stress-induced discomfort and was told to follow-up with cardiology.   Past Medical History:  Diagnosis Date  . Arthritis    back  . Costochondritis   . Esophageal reflux    On omeprazole  . Family history of cancer   . Headache(784.0) 2011   Migraines  . Heart murmur    never caused any problems  . History of hiatal hernia   . History of radiation therapy 05/12/14- 06/17/14   post-op gastric region/ nodes/ 45 Gy in 25 fractions.   . Hypertension    started around 2011  . Iron deficiency anemia due to chronic blood loss 05/25/2015  . Malabsorption of  iron 05/25/2015  . On antineoplastic chemotherapy    5-FU and leucovorin  . Ovarian cyst   . Pneumonia    x 1  . Pulmonary emboli ([place]) 2015  . Stomach cancer ([place]) dx'd 08/2013  . Tuberculosis    ? [id number]  . Wears partial dentures    upper dentures    Past Surgical History:  Procedure Laterality Date  . ABDOMINAL HYSTERECTOMY    . CESAREAN SECTION     x 2  . ESOPHAGOGASTRODUODENOSCOPY N/A 09/16/2013   Procedure: ESOPHAGOGASTRODUODENOSCOPY (EGD);  Surgeon: Wonda Horner, MD;  Location: [facility];  Service: Endoscopy;  Laterality: N/A;  . IR CV LINE INJECTION  08/30/2017  . IR FLUORO GUIDE PORT INSERTION RIGHT  03/21/2017  . IR US GUIDE VASC ACCESS RIGHT  03/21/2017  . LAPAROSCOPY N/A 09/20/2013   Procedure: DIAGNOSTIC LAPAROSCOPY,DISTAL GASTRECTOMY AND FEEDING GASTROJEJUNOSTOMY;  Surgeon: [name], MD;  Location: [facility];  Service: General;  Laterality: N/A;  . OOPHORECTOMY     Around 1985 left ovary removal  . PORTACATH PLACEMENT Left 11/18/2013   Procedure: INSERTION PORT-A-CATH;  Surgeon: [name], MD;  Location: [facility];  Service: General;  Laterality: Left;  . TONSILLECTOMY     Around 1994  . TUBAL LIGATION    . UNILATERAL SALPINGECTOMY Left 11/09/2017   Procedure: UNILATERAL SALPINGECTOMY;  Surgeon: Osborne Oman, MD;  Location: [facility];  Service: Gynecology;  Laterality: Left;  .  VAGINAL HYSTERECTOMY Left 11/09/2017   Procedure: HYSTERECTOMY VAGINAL WITH LEFT SALPINGECTOMY;  Surgeon: Osborne Oman, MD;  Location: [facility];  Service: Gynecology;  Laterality: Left;     Current Outpatient Medications  Medication Sig Dispense Refill  . carvedilol (COREG) 6.25 MG tablet Take 1 tablet (6.25 mg total) by mouth 2 (two) times daily. 180 tablet 3  . dronabinol (MARINOL) 5 MG capsule Take 1 capsule (5 mg total) by mouth 2 (two) times daily before lunch and supper. 60 capsule 0  . fluticasone (FLONASE) 50 MCG/ACT nasal spray Place 1 spray into both nostrils daily. 16 g 2   . HYDROcodone-acetaminophen (NORCO/VICODIN) 5-325 MG tablet Take 1 tablet by mouth every 6 (six) hours as needed for moderate pain. 60 tablet 0  . lidocaine (XYLOCAINE) 2 % solution Use as directed 15 mLs in the mouth or throat as needed for mouth pain. 100 mL 0  . lidocaine-prilocaine (EMLA) cream Apply 1 application topically as needed. 30 g 6  . omeprazole (PRILOSEC) 20 MG capsule Take 20 mg by mouth daily.    . promethazine (PHENERGAN) 25 MG tablet Take 1 tablet (25 mg total) by mouth every 6 (six) hours as needed for nausea or vomiting. 30 tablet 1  . vitamin B-12 (CYANOCOBALAMIN) 500 MCG tablet Take 2,000 mcg by mouth daily.     No current facility-administered medications for this visit.    Facility-Administered Medications Ordered in Other Visits  Medication Dose Route Frequency Provider Last Rate Last Dose  . sodium chloride flush (NS) 0.9 % injection 10 mL  10 mL Intravenous PRN Volanda Napoleon, MD   10 mL at 01/03/18 1316    Allergies:   Tape    Social History:  The patient  reports that she quit smoking about 20 years ago. Her smoking use included cigarettes. She has a 16.00 pack-year smoking history. She has never used smokeless tobacco. She reports current alcohol use. She reports that she does not use drugs.   Family History:  The patient's family history includes Breast cancer in her cousin; Breast cancer (age of onset: 61) in her cousin; Colon cancer in her cousin, paternal grandfather, paternal uncle, and paternal uncle; Colon polyps in her brother and paternal uncle; Diabetes in her maternal grandfather; Diabetes Mellitus II in her mother; Gastric cancer (age of onset: 19) in her cousin; Heart Problems in her maternal grandmother and paternal aunt; Heart attack in her maternal aunt and paternal grandfather; Heart failure in her maternal grandmother; Hypertension in her brother, maternal grandfather, mother, and sister; Lung cancer in her paternal uncle and another family  member; Other in her mother.    ROS: All other systems are reviewed and negative. Unless otherwise mentioned in H&P    PHYSICAL EXAM: VS:  BP 108/76   Pulse 72   Ht 5\' 1"  (1.549 m)   Wt 126 lb (57.2 kg)   LMP 02/28/2016 (Exact Date)   BMI 23.81 kg/m  , BMI Body mass index is 23.81 kg/m. GEN: Well nourished, well developed, in no acute distress HEENT: normal Respiratory:  Clear to auscultation bilaterally, normal work of breathing MS: no deformity or atrophy Skin: warm and dry, no rash Neuro:  Strength and sensation are intact Psych: euthymic mood, full affect   EKG: Not completed this office visit.  Review of EKG completed on 12/19/2018 to [facility] revealed sinus rhythm, LVH, otherwise normal.  Recent Labs: 12/07/2018: ALT 12; BUN 13; Creatinine 0.88; Hemoglobin 10.6; Platelet Count 204;  Potassium 4.2; Sodium 136; TSH 1.212    Lipid Panel    Component Value Date/Time   TRIG 88 11/04/2013 0410      Wt Readings from Last 3 Encounters:  12/20/18 126 lb (57.2 kg)  12/10/18 128 lb (58.1 kg)  12/07/18 128 lb 6.4 oz (58.2 kg)      Other studies Reviewed: Echocardiogram 09/13/2016 Left ventricle: The cavity size was normal. Wall thickness was   normal. Systolic function was normal. The estimated ejection   fraction was in the range of 55% to 60%. Left ventricular   diastolic function parameters were normal. - Mitral valve: There was mild regurgitation.  ASSESSMENT AND PLAN:  1.  Recurrent chest pain: Described as bandlike pain with associated shortness of breath and pressure.  I will schedule a nuclear medicine stress test for evaluation of ischemia with strong family history of CAD.  She will follow-up with Dr. Stanford Breed in the next month to discuss test results and for further recommendations.  She is given a prescription for nitroglycerin which she can take sublingual for recurrent chest discomfort but at been advised to be seated or lying down when  she takes it to avoid hypotension and dizziness.  2.  Hypertension: On carvedilol 12.5 mg twice daily.  She admits to only taking it once a day until recently as she was not reading the directions appropriately.  I have advised her to continue to take it as directed.  She verbalizes understanding.  3.  History of gastric carcinoma: She has been treated by oncology and does have a right upper chest Port-A-Cath.  Current medicines are reviewed at length with the patient today.    Labs/ tests ordered today include: Nuclear medicine stress test. Phill Myron. [place] Pugh, ANP, AACC   12/20/2018 9:22 AM    [facility] [address] Office [phone] Fax [phone]  Notice: This dictation was prepared with Dragon dictation along with smaller phrase technology. Any transcriptional errors that result from this process are unintentional and may not be corrected upon review.

## 2018-12-19 NOTE — Telephone Encounter (Addendum)
-----   Message from Eliezer Bottom, NP sent at 12/19/2018  1:59 PM EDT ----- Called patient to let her know No evidence of recurrent or metastatic disease!!! WOO HOO!!!!!   ----- Message ----- From: Interface, Rad Results In Sent: 12/19/2018  12:33 PM EDT To: Eliezer Bottom, NP

## 2018-12-20 ENCOUNTER — Telehealth (INDEPENDENT_AMBULATORY_CARE_PROVIDER_SITE_OTHER): Payer: Medicare Other | Admitting: Adult Health

## 2018-12-20 ENCOUNTER — Encounter: Payer: Self-pay | Admitting: Adult Health

## 2018-12-20 VITALS — BP 108/76 | HR 72 | Ht 61.0 in | Wt 126.0 lb

## 2018-12-20 DIAGNOSIS — R079 Chest pain, unspecified: Secondary | ICD-10-CM | POA: Diagnosis not present

## 2018-12-20 DIAGNOSIS — I10 Essential (primary) hypertension: Secondary | ICD-10-CM

## 2018-12-20 DIAGNOSIS — C163 Malignant neoplasm of pyloric antrum: Secondary | ICD-10-CM

## 2018-12-20 MED ORDER — NITROGLYCERIN 0.4 MG SL SUBL: 0.4000 mg | SUBLINGUAL_TABLET | SUBLINGUAL | 2 refills | Status: AC | PRN

## 2018-12-20 MED FILL — NITROGLYCERIN 0.4 MG TAB SL: 0.4 | 7 days supply | Qty: 25 | Fill #0

## 2018-12-20 NOTE — Addendum Note (Signed)
Addended by: Vennie Homans on: 12/20/2018 11:44 AM   Modules accepted: Orders

## 2018-12-20 NOTE — Patient Instructions (Signed)
Medication Instructions:  Continue current medications  *If you need a refill on your cardiac medications before your next appointment, please call your pharmacy*  Lab Work: None Ordered  Testing/Procedures: Your physician has requested that you have en exercise stress myoview. For further information please visit [email or web address]. Please follow instruction sheet, as given.    Follow-Up: At [facility], you and your health needs are our priority.  As part of our continuing mission to provide you with exceptional heart care, we have created designated Provider Care Teams.  These Care Teams include your primary Cardiologist (physician) and Advanced Practice Providers (APPs -  Physician Assistants and Nurse Practitioners) who all work together to provide you with the care you need, when you need it.  Your next appointment:   1 Month with Dr Stanford Breed

## 2018-12-21 ENCOUNTER — Telehealth: Payer: Self-pay | Admitting: *Deleted

## 2018-12-21 NOTE — Telephone Encounter (Signed)
[id number] schedule for Monday November 2nd, pt made aware of time, date and location.

## 2018-12-21 NOTE — Telephone Encounter (Signed)
-----   Message from Caprice Kluver sent at 12/20/2018  3:19 PM EDT ----- Regretfully, yes she will need another.  Once they have the test, they have to be in isolation until their nuclear test.  I do not believe she would have been in isolation since the 22nd. This is why we try to schedule them only 3 days prior to the test.  Thanks for checking! Robin ----- Message ----- From: Vennie Homans Sent: 12/20/2018  12:33 PM EDT To: Kendra Opitz Moffitt  Pt is schedule for exercise stress test on 12/27/2018, pt stated she had a covid19 test done 12/13/2018, would she need another one.

## 2018-12-24 ENCOUNTER — Other Ambulatory Visit: Payer: Self-pay

## 2018-12-24 ENCOUNTER — Inpatient Hospital Stay (HOSPITAL_COMMUNITY): Admission: RE | Admit: 2018-12-24 | Payer: Medicare Other | Source: Ambulatory Visit

## 2018-12-24 DIAGNOSIS — Z20822 Contact with and (suspected) exposure to covid-19: Secondary | ICD-10-CM

## 2018-12-25 ENCOUNTER — Telehealth (HOSPITAL_COMMUNITY): Payer: Self-pay

## 2018-12-25 LAB — NOVEL CORONAVIRUS, NAA: SARS-CoV-2, NAA: NOT DETECTED

## 2018-12-25 NOTE — Telephone Encounter (Signed)
Encounter complete. 

## 2018-12-26 ENCOUNTER — Telehealth: Payer: Self-pay | Admitting: Family Medicine

## 2018-12-26 ENCOUNTER — Telehealth: Payer: Self-pay | Admitting: Cardiology

## 2018-12-26 ENCOUNTER — Encounter: Payer: Self-pay | Admitting: *Deleted

## 2018-12-26 NOTE — Telephone Encounter (Signed)
Patient is calling in regards to her Covid test results.

## 2018-12-26 NOTE — Telephone Encounter (Signed)
Unable to reach, mailbox full  Negative test result, sent message in [place]

## 2018-12-26 NOTE — Telephone Encounter (Signed)
Patient called in and received her covid test result  °

## 2018-12-27 ENCOUNTER — Other Ambulatory Visit: Payer: Self-pay

## 2018-12-27 ENCOUNTER — Ambulatory Visit (HOSPITAL_COMMUNITY)
Admission: RE | Admit: 2018-12-27 | Discharge: 2018-12-27 | Disposition: A | Discharge status: Home / Self Care | Payer: Medicare Other | Source: Ambulatory Visit | Attending: Cardiology | Admitting: Cardiology

## 2018-12-27 DIAGNOSIS — R079 Chest pain, unspecified: Secondary | ICD-10-CM | POA: Insufficient documentation

## 2018-12-27 LAB — MYOCARDIAL PERFUSION IMAGING
Estimated workload: 9.9 METS
Exercise duration (min): 10 min
Exercise duration (sec): 0 s
LV dias vol: 82 mL (ref 46–106)
LV sys vol: 39 mL
MPHR: 168 {beats}/min
Peak HR: 153 {beats}/min
Percent HR: 91 %
Rest HR: 72 {beats}/min
SDS: 2
SRS: 2
SSS: 4
TID: 1.19

## 2018-12-27 MED ORDER — TECHNETIUM TC 99M TETROFOSMIN IV KIT
32.1000 | PACK | Freq: Once | INTRAVENOUS | Status: DC | PRN
  Filled 2018-12-27: qty 33

## 2018-12-27 MED ORDER — TECHNETIUM TC 99M TETROFOSMIN IV KIT
32.1000 | PACK | Freq: Once | INTRAVENOUS | Status: AC | PRN
  Administered 2018-12-27: 32.1 via INTRAVENOUS
  Filled 2018-12-27: qty 33

## 2018-12-27 MED ORDER — TECHNETIUM TC 99M TETROFOSMIN IV KIT
9.5000 | PACK | Freq: Once | INTRAVENOUS | Status: AC | PRN
  Administered 2018-12-27: 9.5 via INTRAVENOUS
  Filled 2018-12-27: qty 10

## 2018-12-27 NOTE — Telephone Encounter (Signed)
Paula [name]    12/26/18 4:04 PM Note   Patient called in and received her covid test result

## 2019-01-03 NOTE — Telephone Encounter (Signed)
Dr.Gupta reviewed patient's records and agreed to see patient for an office visit. Left message for patient to call back and schedule an appointment.

## 2019-01-15 ENCOUNTER — Inpatient Hospital Stay: Payer: Medicare Other | Attending: Hematology & Oncology | Admitting: Hematology & Oncology

## 2019-01-15 ENCOUNTER — Inpatient Hospital Stay: Payer: Medicare Other

## 2019-01-15 ENCOUNTER — Encounter: Payer: Self-pay | Admitting: Hematology & Oncology

## 2019-01-15 ENCOUNTER — Other Ambulatory Visit: Payer: Self-pay

## 2019-01-15 ENCOUNTER — Encounter: Payer: Self-pay | Admitting: Gastroenterology

## 2019-01-15 VITALS — BP 98/58 | HR 58 | Temp 97.1°F | Resp 17 | Wt 122.0 lb

## 2019-01-15 DIAGNOSIS — Z85028 Personal history of other malignant neoplasm of stomach: Secondary | ICD-10-CM | POA: Insufficient documentation

## 2019-01-15 DIAGNOSIS — E8809 Other disorders of plasma-protein metabolism, not elsewhere classified: Secondary | ICD-10-CM

## 2019-01-15 DIAGNOSIS — R634 Abnormal weight loss: Secondary | ICD-10-CM | POA: Insufficient documentation

## 2019-01-15 DIAGNOSIS — R112 Nausea with vomiting, unspecified: Secondary | ICD-10-CM

## 2019-01-15 DIAGNOSIS — C169 Malignant neoplasm of stomach, unspecified: Secondary | ICD-10-CM | POA: Diagnosis not present

## 2019-01-15 DIAGNOSIS — K219 Gastro-esophageal reflux disease without esophagitis: Secondary | ICD-10-CM

## 2019-01-15 LAB — CBC WITH DIFFERENTIAL (CANCER CENTER ONLY)
Abs Immature Granulocytes: 0 10*3/uL (ref 0.00–0.07)
Basophils Absolute: 0 10*3/uL (ref 0.0–0.1)
Basophils Relative: 1 %
Eosinophils Absolute: 0.1 10*3/uL (ref 0.0–0.5)
Eosinophils Relative: 3 %
HCT: 33.9 % — ABNORMAL LOW (ref 36.0–46.0)
Hemoglobin: 10.9 g/dL — ABNORMAL LOW (ref 12.0–15.0)
Immature Granulocytes: 0 %
Lymphocytes Relative: 28 %
Lymphs Abs: 1.3 10*3/uL (ref 0.7–4.0)
MCH: 29.1 pg (ref 26.0–34.0)
MCHC: 32.2 g/dL (ref 30.0–36.0)
MCV: 90.6 fL (ref 80.0–100.0)
Monocytes Absolute: 0.5 10*3/uL (ref 0.1–1.0)
Monocytes Relative: 11 %
Neutro Abs: 2.6 10*3/uL (ref 1.7–7.7)
Neutrophils Relative %: 57 %
Platelet Count: 238 10*3/uL (ref 150–400)
RBC: 3.74 MIL/uL — ABNORMAL LOW (ref 3.87–5.11)
RDW: 14.4 % (ref 11.5–15.5)
WBC Count: 4.5 10*3/uL (ref 4.0–10.5)
nRBC: 0 % (ref 0.0–0.2)

## 2019-01-15 LAB — CMP (CANCER CENTER ONLY)
ALT: 9 U/L (ref 0–44)
AST: 13 U/L — ABNORMAL LOW (ref 15–41)
Albumin: 4 g/dL (ref 3.5–5.0)
Alkaline Phosphatase: 57 U/L (ref 38–126)
Anion gap: 5 (ref 5–15)
BUN: 9 mg/dL (ref 6–20)
CO2: 29 mmol/L (ref 22–32)
Calcium: 8.9 mg/dL (ref 8.9–10.3)
Chloride: 105 mmol/L (ref 98–111)
Creatinine: 0.76 mg/dL (ref 0.44–1.00)
GFR, Est AFR Am: >60 mL/min (ref >60)
GFR, Estimated: >60 mL/min (ref >60)
Glucose, Bld: 97 mg/dL (ref 70–99)
Potassium: 4 mmol/L (ref 3.5–5.1)
Sodium: 139 mmol/L (ref 135–145)
Total Bilirubin: 0.7 mg/dL (ref 0.3–1.2)
Total Protein: 6.5 g/dL (ref 6.5–8.1)

## 2019-01-15 NOTE — Patient Instructions (Signed)

## 2019-01-15 NOTE — Progress Notes (Signed)
Hematology and Oncology Follow Up Visit  Paula [name] [id number] [birth year] 53 y.o. 01/15/2019   Principle Diagnosis:  Stage IIA (T1a, N2, M0) gastric adenocarcinoma diagnosed in July of 2015 Intermittent iron deficiency anemia  Past Therapy: Status post diagnostic laparoscopy, distal gastrectomy with billroth II anastamosis and feeding gastrojejunostomy tube under the care of Dr. Barry [name] on 09/20/2013 Adjuvant systemic chemotherapy with 5-FU 600 mg/M2 with leucovorin on days 1, 8, 15, 22, 29 and 36 every 8 weeks Adjuvant concurrent chemoradiation with Xeloda 650 mg/M2 by mouth twice a day for 5 days every week with radiation. The patient completed the course of radiotherapy but she declined to take Xeloda during her radiation  Current Therapy:   Observation IV iron as indicated   Interim History:  [name] is here today for follow-up.  She is still losing weight.  I am not sure as to why she is losing the weight.  We did go ahead and get a CT scan of her abdomen and pelvis.  This was done on 12/19/2018.  Everything looked fine without any obvious pathology that was noted.  She is yet to see gastroenterology.  We will see about making another appointment for her to see if they can help.  She says that she just eats and then throws up.  When I listened to her abdominal bowel sounds today, I really heard very little.  She says she is not constipated.  She not having diarrhea.  Her thyroid levels were checked just last month.  They all looked fine without any evidence of hyperthyroidism.  Her blood pressure is little bit on the lower side.  I think this is probably a reflection of her weight loss.  Her iron studies back in September showed a ferritin of 178 with an iron saturation of 43%.   Overall, I would say performance status is ECOG 1.    Medications:  Allergies as of 01/15/2019      Reactions   Tape Itching   Ok with adhesive tape, ok with paper  tape per patient      Medication List       Accurate as of January 15, 2019  9:59 AM. If you have any questions, ask your nurse or doctor.        carvedilol 6.25 MG tablet Commonly known as: COREG Take 1 tablet (6.25 mg total) by mouth 2 (two) times daily.   dronabinol 5 MG capsule Commonly known as: MARINOL Take 1 capsule (5 mg total) by mouth 2 (two) times daily before lunch and supper.   fluticasone 50 MCG/ACT nasal spray Commonly known as: FLONASE Place 1 spray into both nostrils daily.   HYDROcodone-acetaminophen 5-325 MG tablet Commonly known as: NORCO/VICODIN Take 1 tablet by mouth every 6 (six) hours as needed for moderate pain.   lidocaine 2 % solution Commonly known as: XYLOCAINE Use as directed 15 mLs in the mouth or throat as needed for mouth pain.   lidocaine-prilocaine cream Commonly known as: EMLA Apply 1 application topically as needed.   nitroGLYCERIN 0.4 MG SL tablet Commonly known as: NITROSTAT Place 1 tablet (0.4 mg total) under the tongue every 5 (five) minutes as needed for chest pain.   omeprazole 20 MG capsule Commonly known as: PRILOSEC Take 20 mg by mouth daily.   promethazine 25 MG tablet Commonly known as: PHENERGAN Take 1 tablet (25 mg total) by mouth every 6 (six) hours as needed for nausea or vomiting.   vitamin B-12 500 MCG tablet  Commonly known as: CYANOCOBALAMIN Take 2,000 mcg by mouth daily.       Allergies:  Allergies  Allergen Reactions  . Tape Itching    Ok with adhesive tape, ok with paper tape per patient    Past Medical History, Surgical history, Social history, and Family History were reviewed and updated.  Review of Systems: Review of Systems  Constitutional: Positive for weight loss.  HENT: Negative.   Eyes: Negative.   Respiratory: Negative.   Cardiovascular: Negative.   Gastrointestinal: Positive for vomiting.  Genitourinary: Negative.   Musculoskeletal: Positive for back pain.  Skin: Negative.    Neurological: Negative.   Endo/Heme/Allergies: Negative.   Psychiatric/Behavioral: Negative.      Physical Exam:  vitals were not taken for this visit.   Wt Readings from Last 3 Encounters:  12/27/18 126 lb (57.2 kg)  12/20/18 126 lb (57.2 kg)  12/10/18 128 lb (58.1 kg)    Physical Exam Vitals signs reviewed.  HENT:     Head: Normocephalic and atraumatic.  Eyes:     Pupils: Pupils are equal, round, and reactive to light.  Neck:     Musculoskeletal: Normal range of motion.  Cardiovascular:     Rate and Rhythm: Normal rate and regular rhythm.     Heart sounds: Normal heart sounds.  Pulmonary:     Effort: Pulmonary effort is normal.     Breath sounds: Normal breath sounds.  Abdominal:     General: Bowel sounds are normal.     Palpations: Abdomen is soft.     Comments: Abdominal exam shows well-healed laparotomy scar.  She has decreased bowel sounds.  She really had very little in the way of bowel sounds.  There is no abdominal distention.  There is no guarding or rebound tenderness.  She had no abdominal mass.  There is no palpable hepatosplenomegaly.  Musculoskeletal: Normal range of motion.        General: No tenderness or deformity.  Lymphadenopathy:     Cervical: No cervical adenopathy.  Skin:    General: Skin is warm and dry.     Findings: No erythema or rash.  Neurological:     Mental Status: She is alert and oriented to person, place, and time.  Psychiatric:        Behavior: Behavior normal.        Thought Content: Thought content normal.        Judgment: Judgment normal.       Lab Results  Component Value Date   WBC 4.5 01/15/2019   HGB 10.9 (L) 01/15/2019   HCT 33.9 (L) 01/15/2019   MCV 90.6 01/15/2019   PLT 238 01/15/2019   Lab Results  Component Value Date   FERRITIN 178 11/09/2018   IRON 99 11/09/2018   TIBC 231 (L) 11/09/2018   UIBC 132 11/09/2018   IRONPCTSAT 43 11/09/2018   Lab Results  Component Value Date   RBC 3.74 (L) 01/15/2019    No results found for: Nils Pyle [facility] Lab Results  Component Value Date   IGGSERUM 1,273 05/21/2015   IGMSERUM 40 05/21/2015   No results found for: Odetta Pink, SPEI   Chemistry      Component Value Date/Time   NA 136 12/07/2018 1026   NA 140 01/26/2017 1002   NA 140 09/24/2015 0953   K 4.2 12/07/2018 1026   K 3.5 01/26/2017 1002   K 4.0 09/24/2015 0953   CL 102 12/07/2018 1026  CL 106 01/26/2017 1002   CO2 28 12/07/2018 1026   CO2 26 01/26/2017 1002   CO2 23 09/24/2015 0953   BUN 13 12/07/2018 1026   BUN 6 (L) 01/26/2017 1002   BUN 8.6 09/24/2015 0953   CREATININE 0.88 12/07/2018 1026   CREATININE 1.1 01/26/2017 1002   CREATININE 1.0 09/24/2015 0953      Component Value Date/Time   CALCIUM 9.0 12/07/2018 1026   CALCIUM 8.9 01/26/2017 1002   CALCIUM 9.6 09/24/2015 0953   ALKPHOS 72 12/07/2018 1026   ALKPHOS 68 01/26/2017 1002   ALKPHOS 99 09/24/2015 0953   AST 14 (L) 12/07/2018 1026   AST 17 09/24/2015 0953   ALT 12 12/07/2018 1026   ALT 17 01/26/2017 1002   ALT 11 09/24/2015 0953   BILITOT 0.8 12/07/2018 1026   BILITOT 0.83 09/24/2015 0953       Impression and Plan: [name] is a pleasant53 yo [place] American female with history of stage II gastric cancer and partial gastrectomy followed by adjuvant chemo and radiation.  Hopefully, this weight loss will abate.  Might show much more weight she can lose.  Hopefully, gastroenterology will be able to help [place] out.  I will know she will need some type of upper endoscopy to look inside the stomach.  I would like to see her back in 3 months.  Again, I do not see any problems with respect to cancer recurrence.    Volanda Napoleon, MD 11/24/20209:59 AM

## 2019-01-16 ENCOUNTER — Other Ambulatory Visit: Payer: Self-pay | Admitting: *Deleted

## 2019-01-16 DIAGNOSIS — C163 Malignant neoplasm of pyloric antrum: Secondary | ICD-10-CM

## 2019-01-16 DIAGNOSIS — C169 Malignant neoplasm of stomach, unspecified: Secondary | ICD-10-CM

## 2019-01-16 MED ORDER — HYDROCODONE-ACETAMINOPHEN 5-325 MG PO TABS: 1.0000 | ORAL_TABLET | Freq: Four times a day (QID) | ORAL | 0 refills | Status: DC | PRN

## 2019-01-21 ENCOUNTER — Telehealth: Payer: Self-pay | Admitting: Hematology & Oncology

## 2019-01-21 NOTE — Telephone Encounter (Signed)
Returned VM from patient.  She was calling to get GI MD name that she is seeing.  She was having issues and wanted to contact his office.  I have her Dr Steve Rattler phne number and she will call his office.

## 2019-01-21 NOTE — Progress Notes (Signed)
HPI: FU chest pain.Patient seen Carlena Hurl 6/18 with chest pain and palpitations.Patient had a nuclear study in [facility] October 2013. Her ejection fraction was 74% and no ischemia. Monitor June 2018 showed sinus to sinus tachycardia with rare PVC and brief run of SVT. Echocardiogram June 2018 showed normal LV function and mild mitral regurgitation. Nuclear study November 2020 showed ejection fraction 53% with no ischemia or infarction.  Since last seen, she denies dyspnea on exertion, orthopnea or pedal edema.  No syncope.  Occasional brief flutters.  She has brief chest pain for approximately 5 seconds not exertional.  Current Outpatient Medications  Medication Sig Dispense Refill  . carvedilol (COREG) 6.25 MG tablet Take 1 tablet (6.25 mg total) by mouth 2 (two) times daily. 180 tablet 3  . dronabinol (MARINOL) 5 MG capsule Take 1 capsule (5 mg total) by mouth 2 (two) times daily before lunch and supper. 60 capsule 0  . fluticasone (FLONASE) 50 MCG/ACT nasal spray Place 1 spray into both nostrils daily. 16 g 2  . HYDROcodone-acetaminophen (NORCO/VICODIN) 5-325 MG tablet Take 1 tablet by mouth every 6 (six) hours as needed for moderate pain. 60 tablet 0  . lidocaine (XYLOCAINE) 2 % solution Use as directed 15 mLs in the mouth or throat as needed for mouth pain. 100 mL 0  . lidocaine-prilocaine (EMLA) cream Apply 1 application topically as needed. 30 g 6  . nitroGLYCERIN (NITROSTAT) 0.4 MG SL tablet Place 1 tablet (0.4 mg total) under the tongue every 5 (five) minutes as needed for chest pain. 25 tablet 2  . omeprazole (PRILOSEC) 20 MG capsule Take 20 mg by mouth daily.    . promethazine (PHENERGAN) 25 MG tablet Take 1 tablet (25 mg total) by mouth every 6 (six) hours as needed for nausea or vomiting. 30 tablet 1  . vitamin B-12 (CYANOCOBALAMIN) 500 MCG tablet Take 2,000 mcg by mouth daily.    Marland Kitchen LORazepam (ATIVAN) 0.5 MG tablet Take 0.5 mg by mouth as needed.     No current  facility-administered medications for this visit.    Facility-Administered Medications Ordered in Other Visits  Medication Dose Route Frequency Provider Last Rate Last Dose  . sodium chloride flush (NS) 0.9 % injection 10 mL  10 mL Intravenous PRN Volanda Napoleon, MD   10 mL at 01/03/18 1316     Past Medical History:  Diagnosis Date  . Arthritis    back  . Costochondritis   . Esophageal reflux    On omeprazole  . Family history of cancer   . Headache(784.0) 2011   Migraines  . Heart murmur    never caused any problems  . History of hiatal hernia   . History of radiation therapy 05/12/14- 06/17/14   post-op gastric region/ nodes/ 45 Gy in 25 fractions.   . Hypertension    started around 2011  . Iron deficiency anemia due to chronic blood loss 05/25/2015  . 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 On antineoplastic chemotherapy    5-FU and leucovorin  . Ovarian cyst   . Pneumonia    x 1  . Pulmonary emboli ([place]) 2015  . Stomach cancer ([facility]) dx'd 08/2013  . Tuberculosis    ? [id number]  . Wears partial dentures    upper dentures    Past Surgical History:  Procedure Laterality Date  . ABDOMINAL HYSTERECTOMY    . CESAREAN SECTION     x 2  . ESOPHAGOGASTRODUODENOSCOPY N/A 09/16/2013  Procedure: ESOPHAGOGASTRODUODENOSCOPY (EGD);  Surgeon: Wonda Horner, MD;  Location: [facility];  Service: Endoscopy;  Laterality: N/A;  . IR CV LINE INJECTION  08/30/2017  . IR FLUORO GUIDE PORT INSERTION RIGHT  03/21/2017  . IR US GUIDE VASC ACCESS RIGHT  03/21/2017  . LAPAROSCOPY N/A 09/20/2013   Procedure: DIAGNOSTIC LAPAROSCOPY,DISTAL GASTRECTOMY AND FEEDING GASTROJEJUNOSTOMY;  Surgeon: [name], MD;  Location: [facility];  Service: General;  Laterality: N/A;  . OOPHORECTOMY     Around 1985 left ovary removal  . PORTACATH PLACEMENT Left 11/18/2013   Procedure: INSERTION PORT-A-CATH;  Surgeon: [name], MD;  Location: [facility];  Service: General;  Laterality: Left;  . TONSILLECTOMY     Around  1994  . TUBAL LIGATION    . UNILATERAL SALPINGECTOMY Left 11/09/2017   Procedure: UNILATERAL SALPINGECTOMY;  Surgeon: Osborne Oman, MD;  Location: [facility];  Service: Gynecology;  Laterality: Left;  Marland Kitchen VAGINAL HYSTERECTOMY Left 11/09/2017   Procedure: HYSTERECTOMY VAGINAL WITH LEFT SALPINGECTOMY;  Surgeon: Osborne Oman, MD;  Location: [facility];  Service: Gynecology;  Laterality: Left;    Social History   Socioeconomic History  . Marital status: Legally Separated    Spouse name: Cornelia Copa  . Number of children: 2  . Years of education: Not on file  . Highest education level: Not on file  Occupational History    Employer: [employer]  Social Needs  . Financial resource strain: Not on file  . Food insecurity    Worry: Not on file    Inability: Not on file  . Transportation needs    Medical: Not on file    Non-medical: Not on file  Tobacco Use  . Smoking status: Former Smoker    Packs/day: 1.00    Years: 16.00    Pack years: 16.00    Types: Cigarettes    Quit date: 02/21/1998    Years since quitting: 20.9  . Smokeless tobacco: Never Used  . Tobacco comment: 0.5-1.0 ppd for 16 yrs  Substance and Sexual Activity  . Alcohol use: Yes    Alcohol/week: 0.0 standard drinks    Comment: occasional wine  . Drug use: No  . Sexual activity: Not Currently    Birth control/protection: Surgical    Comment: Depo Inj 09/2017  Lifestyle  . Physical activity    Days per week: Not on file    Minutes per session: Not on file  . Stress: Not on file  Relationships  . Social Herbalist on phone: Not on file    Gets together: Not on file    Attends religious service: Not on file    Active member of club or organization: Not on file    Attends meetings of clubs or organizations: Not on file    Relationship status: Not on file  . Intimate partner violence    Fear of current or ex partner: Not on file    Emotionally abused: Not on file    Physically abused: Not on file     Forced sexual activity: Not on file  Other Topics Concern  . Not on file  Social History Narrative  . Not on file    Family History  Problem Relation Age of Onset  . Hypertension Mother   . Diabetes Mellitus II Mother   . Other Mother        hx of hysterectomy for unspecified reason  . Hypertension Sister   . Hypertension Brother   . Heart  failure Maternal Grandmother   . Heart Problems Maternal Grandmother   . Hypertension Maternal Grandfather   . Diabetes Maternal Grandfather   . Colon cancer Paternal Uncle        dx. late 50s-early 60s  . Gastric cancer Cousin 76  . Colon cancer Cousin        dx. early-late 70s; (father also had colon cancer)  . Colon cancer Paternal Uncle        dx. late 50s-early 60s  . Lung cancer Paternal Uncle        dx. 39s; smoker  . Colon polyps Paternal Uncle        unknown number  . Heart attack Maternal Aunt   . Heart Problems Paternal Aunt   . Heart attack Paternal Grandfather   . Colon cancer Paternal Grandfather        dx. 50s-60s  . Colon polyps Brother        1 maternal half-brother had approx 3 polyps  . Breast cancer Cousin 8  . Breast cancer Cousin        dx. early 41s or earlier (father had colon cancer)  . Lung cancer Other        PGF's brothers (x4); dx. later in life; lim info    ROS: no fevers or chills, productive cough, hemoptysis, dysphasia, odynophagia, melena, hematochezia, dysuria, hematuria, rash, seizure activity, orthopnea, PND, pedal edema, claudication. Remaining systems are negative.  Physical Exam: Well-developed well-nourished in no acute distress.  Skin is warm and dry.  HEENT is normal.  Neck is supple.  Chest is clear to auscultation with normal expansion.  Cardiovascular exam is regular rate and rhythm.  Abdominal exam nontender or distended. No masses palpated. Extremities show no edema. neuro grossly intact  ECG-sinus bradycardia at a rate of 57, no ST changes, left ventricular hypertrophy.   Personally reviewed  A/P  1 atypical chest pain-most recent nuclear study showed no ischemia.  We will not pursue further evaluation.  Electrocardiogram shows no ST changes.  2 hypertension-blood pressure is low today.  She has occasional dizziness.  Decrease carvedilol to 3.125 mg twice daily and if blood pressure continues to be low will discontinue.  3 palpitations-decrease carvedilol as outlined above and follow for recurrent symptoms.  4 history of mitral regurgitation-mild on most recent echocardiogram.  5 history of pulmonary embolus.  Kirk Ruths, MD

## 2019-01-28 ENCOUNTER — Ambulatory Visit (INDEPENDENT_AMBULATORY_CARE_PROVIDER_SITE_OTHER): Payer: Medicare Other | Admitting: Cardiology

## 2019-01-28 ENCOUNTER — Encounter: Payer: Self-pay | Admitting: Cardiology

## 2019-01-28 ENCOUNTER — Other Ambulatory Visit: Payer: Self-pay

## 2019-01-28 VITALS — BP 92/69 | HR 57 | Temp 97.9°F | Ht 61.0 in | Wt 122.6 lb

## 2019-01-28 DIAGNOSIS — I10 Essential (primary) hypertension: Secondary | ICD-10-CM

## 2019-01-28 DIAGNOSIS — R002 Palpitations: Secondary | ICD-10-CM

## 2019-01-28 DIAGNOSIS — R0789 Other chest pain: Secondary | ICD-10-CM | POA: Diagnosis not present

## 2019-01-28 MED ORDER — CARVEDILOL 6.25 MG PO TABS: 3.1250 mg | ORAL_TABLET | Freq: Two times a day (BID) | ORAL | 3 refills | Status: DC

## 2019-01-28 NOTE — Patient Instructions (Signed)
Medication Instructions:  DECREASE CARVEDILOL TO 3.125 MG TWICE DAILY= 1/2 OF THE 6.25 MG TABLET TWICE DAILY  *If you need a refill on your cardiac medications before your next appointment, please call your pharmacy*  Lab Work: If you have labs (blood work) drawn today and your tests are completely normal, you will receive your results only by: Marland Kitchen MyChart Message (if you have MyChart) OR . A paper copy in the mail If you have any lab test that is abnormal or we need to change your treatment, we will call you to review the results.  Follow-Up: At [facility], you and your health needs are our priority.  As part of our continuing mission to provide you with exceptional heart care, we have created designated Provider Care Teams.  These Care Teams include your primary Cardiologist (physician) and Advanced Practice Providers (APPs -  Physician Assistants and Nurse Practitioners) who all work together to provide you with the care you need, when you need it.  Your next appointment:   12 month(s)  The format for your next appointment:   Either In Person or Virtual  Provider:   You may see Kirk Ruths MD or one of the following Advanced Practice Providers on your designated Care Team:    Kerin Ransom, PA-C  [place], [place]  Coletta Memos, 

## 2019-02-04 ENCOUNTER — Encounter: Payer: Self-pay | Admitting: Gastroenterology

## 2019-02-04 ENCOUNTER — Ambulatory Visit (INDEPENDENT_AMBULATORY_CARE_PROVIDER_SITE_OTHER): Payer: Medicare Other | Admitting: Gastroenterology

## 2019-02-04 ENCOUNTER — Other Ambulatory Visit: Payer: Self-pay

## 2019-02-04 VITALS — BP 102/64 | HR 62 | Ht 61.0 in | Wt 123.0 lb

## 2019-02-04 DIAGNOSIS — Z1159 Encounter for screening for other viral diseases: Secondary | ICD-10-CM | POA: Diagnosis not present

## 2019-02-04 DIAGNOSIS — R112 Nausea with vomiting, unspecified: Secondary | ICD-10-CM | POA: Diagnosis not present

## 2019-02-04 DIAGNOSIS — R634 Abnormal weight loss: Secondary | ICD-10-CM

## 2019-02-04 DIAGNOSIS — K5909 Other constipation: Secondary | ICD-10-CM

## 2019-02-04 MED ORDER — PANTOPRAZOLE SODIUM 40 MG PO TBEC: 40.0000 mg | DELAYED_RELEASE_TABLET | Freq: Every day | ORAL | 11 refills | Status: DC

## 2019-02-04 MED FILL — PANTOPRAZOLE SOD DR 40 MG T: 40 | 30 days supply | Qty: 30 | Fill #0

## 2019-02-04 NOTE — Patient Instructions (Addendum)
If you are age 52 or older, your body mass index should be between 23-30. Your Body mass index is 23.24 kg/m. If this is out of the aforementioned range listed, please consider follow up with your Primary Care Provider.  If you are age 91 or younger, your body mass index should be between 19-25. Your Body mass index is 23.24 kg/m. If this is out of the aformentioned range listed, please consider follow up with your Primary Care Provider.   You have been scheduled for an endoscopy and colonoscopy. Please follow the written instructions given to you at your visit today. Please pick up your prep supplies at the pharmacy within the next 1-3 days. If you use inhalers (even only as needed), please bring them with you on the day of your procedure. Your physician has requested that you go to www.startemmi.com and enter the access code given to you at your visit today. This web site gives a general overview about your procedure. However, you should still follow specific instructions given to you by our office regarding your preparation for the procedure.  We have sent the following medications to your pharmacy for you to pick up at your convenience: [place] provider has ordered IV Marisue [place], you will be contacted with your appointment for this.   Two days before your procedure: Mix 3 packs (or capfuls) of Miralax in 48 ounces of clear liquid and drink at 6pm.   Wean off marijuana.    Small more frequent meals.   Canabis Hyperemesis Syndrome   Thank you,  Dr. Jackquline Denmark

## 2019-02-04 NOTE — Progress Notes (Signed)
Chief Complaint: N/V/wt loss  Referring Provider:  Pierre-Louis, Daphnee, *      ASSESSMENT AND PLAN;   #1. N/V/wt loss - ?  Etiology. Neg CT AP 12/19/2018 for recurrence of gastric cancer.  Could represent cannabis hyperemesis syndrome, d/t narcotics/gastroparesis, r/o anastomotic ulcers or any recurrence. Nl TSH  #2. Stage IIA (T1a, N2, M0) gastric AdenoCa s/p BII gastrectomy/feeding J tube July 2015 followed by neoadjuvant chemoradiation. Neg CT AP 12/19/2018. EGD 01/2018 @ [facility]-as below. Neg for recurrence.  #3. IDA d/t Billroth II gastrectomy.  #4. Chronic constipation.  Never had screening colonoscopy.  Plan: -Proceed with EGD/colon (2 day prep).  I have discussed risks and benefits. -Change omeprazole to protonix 40mg  po qd, 30, 11 refills  -Miralax 17g po qd. -Stop marijauana use.  She can use Marinol as prescribed for now.  I would prefer to wean her off that too, if possible. Hot shower PRN helps. -I have encouraged her to eat small but more frequent meals 6-7 times/day.  She has pending dietary consultation. -IV ferraheme x 2 doses (Dr Marin Olp).  She felt much better after she received iron previously. -Can continue to use phenergan prn as before. -Minimize narcotics.   HPI:    Paula [name] is a 53 y.o. female  With history of stage II gastric cancer s/p Billroth II gastrectomy 2015.  Has been having persistent abdominal pain ever since.  Most recently started having increasing nausea/vomiting.  Also saw 2 small specks of blood in the vomitus.  The above episode lasted for about 4 to 5 days.  Currently feels somewhat better.  Could not identify any triggering factors.  History of longstanding constipation which is better currently.  She is having 1 bowel movement per day.  No melena or hematochezia.  Has generalized malaise, and does not feel good.  She felt much better when she took iron previously.  Has been persistently losing weight.  Stable over  last 1 month.  Does not have much appetite. Wt Readings from Last 3 Encounters:  02/04/19 123 lb (55.8 kg)  01/28/19 122 lb 9.6 oz (55.6 kg)  01/15/19 122 lb (55.3 kg)   Underwent CT Abdo/pelvis on 12/19/2018 which did not show any acute abnormalities.  No recurrence.  EGD 02/07/2018 [facility] -Salmon-colored mucosa extending from 37 to 38 cm. -Billroth II gastrectomy.  Angular takeoff of both limbs. -Gastritis (Bx- neg) -Single 3 mm gastric polyp at anastomosis (Bx-hyperplastic polyp) -Small amount of food residue in stomach.  Bilious gastric fluid. -Dilated lacteals in the duodenum (Bx- neg, neg for celiac) -No recurrence.   Past Medical History:  Diagnosis Date  . Arthritis    back  . Costochondritis   . Esophageal reflux    On omeprazole  . Family history of cancer   . Headache(784.0) 2011   Migraines  . Heart murmur    never caused any problems  . History of hiatal hernia   . History of radiation therapy 05/12/14- 06/17/14   post-op gastric region/ nodes/ 45 Gy in 25 fractions.   . Hypertension    started around 2011  . Iron deficiency anemia due to chronic blood loss 05/25/2015  . 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 On antineoplastic chemotherapy    5-FU and leucovorin  . Ovarian cyst   . Pneumonia    x 1  . Pulmonary emboli ([place]) 2015  . Stomach cancer ([place]) dx'd 08/2013  . Tuberculosis    ? [id number]  . Wears partial dentures  upper dentures    Past Surgical History:  Procedure Laterality Date  . ABDOMINAL HYSTERECTOMY    . CESAREAN SECTION     x 2  . ESOPHAGOGASTRODUODENOSCOPY N/A 09/16/2013   Procedure: ESOPHAGOGASTRODUODENOSCOPY (EGD);  Surgeon: Wonda Horner, MD;  Location: [facility];  Service: Endoscopy;  Laterality: N/A;  . IR CV LINE INJECTION  08/30/2017  . IR FLUORO GUIDE PORT INSERTION RIGHT  03/21/2017  . IR US GUIDE VASC ACCESS RIGHT  03/21/2017  . LAPAROSCOPY N/A 09/20/2013   Procedure: DIAGNOSTIC LAPAROSCOPY,DISTAL GASTRECTOMY AND FEEDING  GASTROJEJUNOSTOMY;  Surgeon: [name], MD;  Location: [facility];  Service: General;  Laterality: N/A;  . OOPHORECTOMY     Around 1985 left ovary removal  . PORTACATH PLACEMENT Left 11/18/2013   Procedure: INSERTION PORT-A-CATH;  Surgeon: [name], MD;  Location: [facility];  Service: General;  Laterality: Left;  . TONSILLECTOMY     Around 1994  . TUBAL LIGATION    . UNILATERAL SALPINGECTOMY Left 11/09/2017   Procedure: UNILATERAL SALPINGECTOMY;  Surgeon: Osborne Oman, MD;  Location: [facility];  Service: Gynecology;  Laterality: Left;  Marland Kitchen VAGINAL HYSTERECTOMY Left 11/09/2017   Procedure: HYSTERECTOMY VAGINAL WITH LEFT SALPINGECTOMY;  Surgeon: Osborne Oman, MD;  Location: [facility];  Service: Gynecology;  Laterality: Left;    Family History  Problem Relation Age of Onset  . Hypertension Mother   . Diabetes Mellitus II Mother   . Other Mother        hx of hysterectomy for unspecified reason  . Hypertension Sister   . Hypertension Brother   . Heart failure Maternal Grandmother   . Heart Problems Maternal Grandmother   . Hypertension Maternal Grandfather   . Diabetes Maternal Grandfather   . Colon cancer Paternal Uncle        dx. late 50s-early 60s  . Gastric cancer Cousin 27  . Colon cancer Cousin        dx. early-late 20s; (father also had colon cancer)  . Colon cancer Paternal Uncle        dx. late 50s-early 60s  . Lung cancer Paternal Uncle        dx. 77s; smoker  . Colon polyps Paternal Uncle        unknown number  . Heart attack Maternal Aunt   . Heart Problems Paternal Aunt   . Heart attack Paternal Grandfather   . Colon cancer Paternal Grandfather        dx. 50s-60s  . Colon polyps Brother        1 maternal half-brother had approx 3 polyps  . Breast cancer Cousin 28  . Breast cancer Cousin        dx. early 43s or earlier (father had colon cancer)  . Lung cancer Other        PGF's brothers (x4); dx. later in life; lim info    Social History   Tobacco Use  .  Smoking status: Former Smoker    Packs/day: 1.00    Years: 16.00    Pack years: 16.00    Types: Cigarettes    Quit date: 02/21/1998    Years since quitting: 20.9  . Smokeless tobacco: Never Used  . Tobacco comment: 0.5-1.0 ppd for 16 yrs  Substance Use Topics  . Alcohol use: Yes    Alcohol/week: 0.0 standard drinks    Comment: occasional wine  . Drug use: Yes    Types: Marijuana    Current Outpatient Medications  Medication Sig Dispense Refill  .  carvedilol (COREG) 6.25 MG tablet Take 0.5 tablets (3.125 mg total) by mouth 2 (two) times daily. 180 tablet 3  . dronabinol (MARINOL) 5 MG capsule Take 1 capsule (5 mg total) by mouth 2 (two) times daily before lunch and supper. 60 capsule 0  . fluticasone (FLONASE) 50 MCG/ACT nasal spray Place 1 spray into both nostrils daily. 16 g 2  . HYDROcodone-acetaminophen (NORCO/VICODIN) 5-325 MG tablet Take 1 tablet by mouth every 6 (six) hours as needed for moderate pain. 60 tablet 0  . lidocaine (XYLOCAINE) 2 % solution Use as directed 15 mLs in the mouth or throat as needed for mouth pain. 100 mL 0  . lidocaine-prilocaine (EMLA) cream Apply 1 application topically as needed. 30 g 6  . LORazepam (ATIVAN) 0.5 MG tablet Take 0.5 mg by mouth as needed.    . nitroGLYCERIN (NITROSTAT) 0.4 MG SL tablet Place 1 tablet (0.4 mg total) under the tongue every 5 (five) minutes as needed for chest pain. 25 tablet 2  . omeprazole (PRILOSEC) 20 MG capsule Take 20 mg by mouth daily.    . promethazine (PHENERGAN) 25 MG tablet Take 1 tablet (25 mg total) by mouth every 6 (six) hours as needed for nausea or vomiting. 30 tablet 1  . vitamin B-12 (CYANOCOBALAMIN) 500 MCG tablet Take 2,000 mcg by mouth daily.     No current facility-administered medications for this visit.   Facility-Administered Medications Ordered in Other Visits  Medication Dose Route Frequency Provider Last Rate Last Admin  . sodium chloride flush (NS) 0.9 % injection 10 mL  10 mL Intravenous PRN  Volanda Napoleon, MD   10 mL at 01/03/18 1316    Allergies  Allergen Reactions  . Tape Itching    Ok with adhesive tape, ok with paper tape per patient    Review of Systems:  Constitutional: Denies fever, chills, diaphoresis, appetite change and fatigue.  HEENT: Denies photophobia, eye pain, redness, hearing loss, ear pain, congestion, sore throat, rhinorrhea, sneezing, mouth sores, neck pain, neck stiffness and tinnitus.   Respiratory: Denies SOB, DOE, cough, chest tightness,  and wheezing.   Cardiovascular: Had palpitations.  Negative extensive cardiac work-up. Genitourinary: Denies dysuria, urgency, frequency, hematuria, flank pain and difficulty urinating.  Musculoskeletal: Denies myalgias, back pain, joint swelling, arthralgias and gait problem.  Skin: No rash.  Neurological: Denies dizziness, seizures, syncope, weakness, light-headedness, numbness and headaches.  Hematological: Denies adenopathy. Easy bruising, personal or family bleeding history  Psychiatric/Behavioral: has anxiety or depression     Physical Exam:    BP 102/64   Pulse 62   Ht 5\' 1"  (1.549 m)   Wt 123 lb (55.8 kg)   LMP 02/28/2016 (Exact Date)   BMI 23.24 kg/m  Wt Readings from Last 3 Encounters:  02/04/19 123 lb (55.8 kg)  01/28/19 122 lb 9.6 oz (55.6 kg)  01/15/19 122 lb (55.3 kg)   Constitutional: Had cachexia.  In no acute distress. Psychiatric: Normal mood and affect. Behavior is normal. HEENT: Pupils normal.  Conjunctivae are normal. No scleral icterus. Neck supple.  Cardiovascular: Normal rate, regular rhythm. No edema Pulmonary/chest: Effort normal and breath sounds normal. No wheezing, rales or rhonchi. Abdominal: Soft, nondistended. Nontender. Bowel sounds active throughout. There are no masses palpable. No hepatomegaly.  Well-healed surgical scars. Rectal:  defered Neurological: Alert and oriented to person place and time. Skin: Skin is warm and dry. No rashes noted.  Data Reviewed: I  have personally reviewed following labs and imaging studies  CBC: CBC  Latest Ref Rng & Units 01/15/2019 12/07/2018 11/09/2018  WBC 4.0 - 10.5 K/uL 4.5 4.8 5.7  Hemoglobin 12.0 - 15.0 g/dL 10.9(L) 10.6(L) 12.0  Hematocrit 36.0 - 46.0 % 33.9(L) 34.3(L) 38.7  Platelets 150 - 400 K/uL 238 204 153    CMP: CMP Latest Ref Rng & Units 01/15/2019 12/07/2018 11/09/2018  Glucose 70 - 99 mg/dL 97 142(H) 127(H)  BUN 6 - 20 mg/dL 9 13 12   Creatinine 0.44 - 1.00 mg/dL 0.76 0.88 1.00  Sodium 135 - 145 mmol/L 139 136 136  Potassium 3.5 - 5.1 mmol/L 4.0 4.2 4.6  Chloride 98 - 111 mmol/L 105 102 102  CO2 22 - 32 mmol/L 29 28 29   Calcium 8.9 - 10.3 mg/dL 8.9 9.0 9.4  Total Protein 6.5 - 8.1 g/dL 6.5 6.3(L) 6.7  Total Bilirubin 0.3 - 1.2 mg/dL 0.7 0.8 0.5  Alkaline Phos 38 - 126 U/L 57 72 81  AST 15 - 41 U/L 13(L) 14(L) 15  ALT 0 - 44 U/L 9 12 12    CT scan was reviewed with the patient independently as well.   Carmell Austria, MD 02/04/2019, 3:30 PM  Cc: Pierre-Louis, Daphnee, *

## 2019-02-05 ENCOUNTER — Inpatient Hospital Stay: Payer: Medicare Other | Attending: Hematology & Oncology

## 2019-02-05 ENCOUNTER — Inpatient Hospital Stay: Payer: Medicare Other

## 2019-02-05 ENCOUNTER — Telehealth: Payer: Self-pay | Admitting: *Deleted

## 2019-02-05 ENCOUNTER — Telehealth: Payer: Self-pay | Admitting: Hematology & Oncology

## 2019-02-05 ENCOUNTER — Other Ambulatory Visit: Payer: Self-pay | Admitting: Family

## 2019-02-05 ENCOUNTER — Other Ambulatory Visit: Payer: Self-pay | Admitting: *Deleted

## 2019-02-05 ENCOUNTER — Telehealth: Payer: Self-pay | Admitting: Gastroenterology

## 2019-02-05 VITALS — BP 104/69 | HR 64 | Temp 97.8°F | Resp 18

## 2019-02-05 DIAGNOSIS — Z85028 Personal history of other malignant neoplasm of stomach: Secondary | ICD-10-CM | POA: Insufficient documentation

## 2019-02-05 DIAGNOSIS — D500 Iron deficiency anemia secondary to blood loss (chronic): Secondary | ICD-10-CM

## 2019-02-05 DIAGNOSIS — D509 Iron deficiency anemia, unspecified: Secondary | ICD-10-CM | POA: Insufficient documentation

## 2019-02-05 DIAGNOSIS — Z95828 Presence of other vascular implants and grafts: Secondary | ICD-10-CM

## 2019-02-05 LAB — CBC WITH DIFFERENTIAL (CANCER CENTER ONLY)
Abs Immature Granulocytes: 0.02 10*3/uL (ref 0.00–0.07)
Basophils Absolute: 0 10*3/uL (ref 0.0–0.1)
Basophils Relative: 1 %
Eosinophils Absolute: 0.1 10*3/uL (ref 0.0–0.5)
Eosinophils Relative: 2 %
HCT: 33.7 % — ABNORMAL LOW (ref 36.0–46.0)
Hemoglobin: 10.6 g/dL — ABNORMAL LOW (ref 12.0–15.0)
Immature Granulocytes: 1 %
Lymphocytes Relative: 40 %
Lymphs Abs: 1.5 10*3/uL (ref 0.7–4.0)
MCH: 29 pg (ref 26.0–34.0)
MCHC: 31.5 g/dL (ref 30.0–36.0)
MCV: 92.1 fL (ref 80.0–100.0)
Monocytes Absolute: 0.4 10*3/uL (ref 0.1–1.0)
Monocytes Relative: 10 %
Neutro Abs: 1.7 10*3/uL (ref 1.7–7.7)
Neutrophils Relative %: 46 %
Platelet Count: 241 10*3/uL (ref 150–400)
RBC: 3.66 MIL/uL — ABNORMAL LOW (ref 3.87–5.11)
RDW: 14 % (ref 11.5–15.5)
WBC Count: 3.7 10*3/uL — ABNORMAL LOW (ref 4.0–10.5)
nRBC: 0 % (ref 0.0–0.2)

## 2019-02-05 LAB — RETICULOCYTES
Immature Retic Fract: 8.4 % (ref 2.3–15.9)
RBC.: 3.73 MIL/uL — ABNORMAL LOW (ref 3.87–5.11)
Retic Count, Absolute: 60.8 10*3/uL (ref 19.0–186.0)
Retic Ct Pct: 1.6 % (ref 0.4–3.1)

## 2019-02-05 MED ORDER — SODIUM CHLORIDE 0.9% FLUSH
10.0000 mL | INTRAVENOUS | Status: DC | PRN
  Administered 2019-02-05: 10 mL via INTRAVENOUS
  Filled 2019-02-05: qty 10

## 2019-02-05 MED ORDER — LIDOCAINE-PRILOCAINE 2.5-2.5 % EX CREA: 1.0000 "application " | TOPICAL_CREAM | CUTANEOUS | 6 refills | Status: DC | PRN

## 2019-02-05 MED ORDER — FERAHEME 510 MG/17ML IV SOLN: 510.0000 mg | Freq: Once | INTRAVENOUS | 0 refills | Status: DC

## 2019-02-05 MED ORDER — HEPARIN SOD (PORK) LOCK FLUSH 100 UNIT/ML IV SOLN
500.0000 [IU] | Freq: Once | INTRAVENOUS | Status: AC
  Administered 2019-02-05: 500 [IU] via INTRAVENOUS
  Filled 2019-02-05: qty 5

## 2019-02-05 MED ORDER — SUPREP BOWEL PREP KIT 17.5-3.13-1.6 GM/177ML PO SOLN: 1.0000 | ORAL | 0 refills | Status: DC

## 2019-02-05 MED FILL — SUPREP BOWEL PREP KIT: 17.5-3.13-1 | 1 days supply | Qty: 354 | Fill #0

## 2019-02-05 MED FILL — LIDOCAINE-PRILOCAINE 2.5-2.: 2.5-2.5 | 15 days supply | Qty: 30 | Fill #0

## 2019-02-05 NOTE — Patient Instructions (Signed)
Implanted Port Insertion, Care After This sheet gives you information about how to care for yourself after your procedure. Your health care provider may also give you more specific instructions. If you have problems or questions, contact your health care provider. What can I expect after the procedure? After the procedure, it is common to have:  Discomfort at the port insertion site.  Bruising on the skin over the port. This should improve over 3-4 days. Follow these instructions at home: Port care  After your port is placed, you will get a manufacturer's information card. The card has information about your port. Keep this card with you at all times.  Take care of the port as told by your health care provider. Ask your health care provider if you or a family member can get training for taking care of the port at home. A home health care nurse may also take care of the port.  Make sure to remember what type of port you have. Incision care      Follow instructions from your health care provider about how to take care of your port insertion site. Make sure you: ? Wash your hands with soap and water before and after you change your bandage (dressing). If soap and water are not available, use hand sanitizer. ? Change your dressing as told by your health care provider. ? Leave stitches (sutures), skin glue, or adhesive strips in place. These skin closures may need to stay in place for 2 weeks or longer. If adhesive strip edges start to loosen and curl up, you may trim the loose edges. Do not remove adhesive strips completely unless your health care provider tells you to do that.  Check your port insertion site every day for signs of infection. Check for: ? Redness, swelling, or pain. ? Fluid or blood. ? Warmth. ? Pus or a bad smell. Activity  Return to your normal activities as told by your health care provider. Ask your health care provider what activities are safe for you.  Do not  lift anything that is heavier than 10 lb (4.5 kg), or the limit that you are told, until your health care provider says that it is safe. General instructions  Take over-the-counter and prescription medicines only as told by your health care provider.  Do not take baths, swim, or use a hot tub until your health care provider approves. Ask your health care provider if you may take showers. You may only be allowed to take sponge baths.  Do not drive for 24 hours if you were given a sedative during your procedure.  Wear a medical alert bracelet in case of an emergency. This will tell any health care providers that you have a port.  Keep all follow-up visits as told by your health care provider. This is important. Contact a health care provider if:  You cannot flush your port with saline as directed, or you cannot draw blood from the port.  You have a fever or chills.  You have redness, swelling, or pain around your port insertion site.  You have fluid or blood coming from your port insertion site.  Your port insertion site feels warm to the touch.  You have pus or a bad smell coming from the port insertion site. Get help right away if:  You have chest pain or shortness of breath.  You have bleeding from your port that you cannot control. Summary  Take care of the port as told by your health   care provider. Keep the manufacturer's information card with you at all times.  Change your dressing as told by your health care provider.  Contact a health care provider if you have a fever or chills or if you have redness, swelling, or pain around your port insertion site.  Keep all follow-up visits as told by your health care provider. This information is not intended to replace advice given to you by your health care provider. Make sure you discuss any questions you have with your health care provider. Document Released: 11/28/2012 Document Revised: 09/05/2017 Document Reviewed: 09/05/2017  Elsevier Patient Education  2020 Elsevier Inc.  

## 2019-02-05 NOTE — Addendum Note (Signed)
Addended by: Karena Addison on: 02/05/2019 01:03 PM   Modules accepted: Orders

## 2019-02-05 NOTE — Telephone Encounter (Signed)
Called and spoke with patient regarding adding lab appt on for today per 12/15 sch msg

## 2019-02-05 NOTE — Addendum Note (Signed)
Addended by: Karena Addison on: 02/05/2019 08:30 AM   Modules accepted: Orders

## 2019-02-05 NOTE — Telephone Encounter (Signed)
Message received from [place] with Dr. Steve Rattler office stating that Dr. Lyndel Safe would like for pt to get Feraheme infusion.  Jory Ee NP notified and order received for pt to come in for lab work this week.  Message sent to scheduling.

## 2019-02-05 NOTE — Telephone Encounter (Signed)
[facility] outpt pharmacy needs clarification on Faraheme, prescription sent is for an intravenous injection. Pls contact the pharmacy.

## 2019-02-06 LAB — IRON AND TIBC
Iron: 86 ug/dL (ref 41–142)
Saturation Ratios: 47 % (ref 21–57)
TIBC: 185 ug/dL — ABNORMAL LOW (ref 236–444)
UIBC: 99 ug/dL — ABNORMAL LOW (ref 120–384)

## 2019-02-06 LAB — FERRITIN: Ferritin: 147 ng/mL (ref 11–307)

## 2019-02-07 NOTE — Telephone Encounter (Signed)
Patient will receive this medication at Dr. Zettie Pho office pending lab results.

## 2019-02-12 ENCOUNTER — Other Ambulatory Visit: Payer: Self-pay

## 2019-02-12 DIAGNOSIS — C163 Malignant neoplasm of pyloric antrum: Secondary | ICD-10-CM

## 2019-02-12 DIAGNOSIS — C169 Malignant neoplasm of stomach, unspecified: Secondary | ICD-10-CM

## 2019-02-12 MED ORDER — HYDROCODONE-ACETAMINOPHEN 5-325 MG PO TABS: 1.0000 | ORAL_TABLET | Freq: Four times a day (QID) | ORAL | 0 refills | Status: DC | PRN

## 2019-02-18 ENCOUNTER — Telehealth: Payer: Self-pay | Admitting: Gastroenterology

## 2019-02-18 NOTE — Telephone Encounter (Signed)
Hi Dr. Lyndel Safe. This pt has rescheduled her endocolon that was scheduled on Wednesday 12/30 with you due to lack of transportation. She has rescheduled to 03/06/19. Thank you.

## 2019-02-20 ENCOUNTER — Encounter: Payer: Medicare Other | Admitting: Gastroenterology

## 2019-02-23 ENCOUNTER — Emergency Department (HOSPITAL_BASED_OUTPATIENT_CLINIC_OR_DEPARTMENT_OTHER): Payer: Medicare Other

## 2019-02-23 ENCOUNTER — Other Ambulatory Visit: Payer: Self-pay

## 2019-02-23 ENCOUNTER — Encounter (HOSPITAL_BASED_OUTPATIENT_CLINIC_OR_DEPARTMENT_OTHER): Payer: Self-pay | Admitting: Emergency Medicine

## 2019-02-23 ENCOUNTER — Emergency Department (HOSPITAL_BASED_OUTPATIENT_CLINIC_OR_DEPARTMENT_OTHER)
Admission: EM | Admit: 2019-02-23 | Discharge: 2019-02-24 | Disposition: A | Discharge status: Home / Self Care | Payer: Medicare Other | Attending: Emergency Medicine | Admitting: Emergency Medicine

## 2019-02-23 DIAGNOSIS — Z79899 Other long term (current) drug therapy: Secondary | ICD-10-CM | POA: Diagnosis not present

## 2019-02-23 DIAGNOSIS — I10 Essential (primary) hypertension: Secondary | ICD-10-CM | POA: Diagnosis not present

## 2019-02-23 DIAGNOSIS — Z87891 Personal history of nicotine dependence: Secondary | ICD-10-CM | POA: Diagnosis not present

## 2019-02-23 DIAGNOSIS — R112 Nausea with vomiting, unspecified: Secondary | ICD-10-CM | POA: Diagnosis not present

## 2019-02-23 DIAGNOSIS — R079 Chest pain, unspecified: Secondary | ICD-10-CM | POA: Diagnosis present

## 2019-02-23 LAB — LIPASE, BLOOD: Lipase: 15 U/L (ref 11–51)

## 2019-02-23 LAB — COMPREHENSIVE METABOLIC PANEL
ALT: 18 U/L (ref 0–44)
AST: 14 U/L — ABNORMAL LOW (ref 15–41)
Albumin: 4.2 g/dL (ref 3.5–5.0)
Alkaline Phosphatase: 58 U/L (ref 38–126)
Anion gap: 6 (ref 5–15)
BUN: 11 mg/dL (ref 6–20)
CO2: 27 mmol/L (ref 22–32)
Calcium: 8.6 mg/dL — ABNORMAL LOW (ref 8.9–10.3)
Chloride: 105 mmol/L (ref 98–111)
Creatinine, Ser: 0.8 mg/dL (ref 0.44–1.00)
GFR calc Af Amer: >60 mL/min (ref >60)
GFR calc non Af Amer: >60 mL/min (ref >60)
Glucose, Bld: 140 mg/dL — ABNORMAL HIGH (ref 70–99)
Potassium: 3.6 mmol/L (ref 3.5–5.1)
Sodium: 138 mmol/L (ref 135–145)
Total Bilirubin: 0.8 mg/dL (ref 0.3–1.2)
Total Protein: 6.8 g/dL (ref 6.5–8.1)

## 2019-02-23 LAB — URINALYSIS, ROUTINE W REFLEX MICROSCOPIC
Bilirubin Urine: NEGATIVE
Glucose, UA: NEGATIVE mg/dL
Hgb urine dipstick: NEGATIVE
Ketones, ur: NEGATIVE mg/dL
Leukocytes,Ua: NEGATIVE
Nitrite: NEGATIVE
Protein, ur: NEGATIVE mg/dL
Specific Gravity, Urine: 1.015 (ref 1.005–1.030)
pH: 5.5 (ref 5.0–8.0)

## 2019-02-23 LAB — CBC
HCT: 39.3 % (ref 36.0–46.0)
Hemoglobin: 12.3 g/dL (ref 12.0–15.0)
MCH: 29.4 pg (ref 26.0–34.0)
MCHC: 31.3 g/dL (ref 30.0–36.0)
MCV: 93.8 fL (ref 80.0–100.0)
Platelets: 246 10*3/uL (ref 150–400)
RBC: 4.19 MIL/uL (ref 3.87–5.11)
RDW: 13.6 % (ref 11.5–15.5)
WBC: 7.1 10*3/uL (ref 4.0–10.5)
nRBC: 0 % (ref 0.0–0.2)

## 2019-02-23 LAB — TROPONIN I (HIGH SENSITIVITY)
Troponin I (High Sensitivity): 2 ng/L (ref <18)
Troponin I (High Sensitivity): 2 ng/L (ref <18)

## 2019-02-23 MED ORDER — ONDANSETRON 4 MG PO TBDP
4.0000 mg | ORAL_TABLET | Freq: Once | ORAL | Status: AC
  Administered 2019-02-23: 4 mg via ORAL
  Filled 2019-02-23: qty 1

## 2019-02-23 MED ORDER — HYDROMORPHONE HCL 1 MG/ML IJ SOLN
1.0000 mg | Freq: Once | INTRAMUSCULAR | Status: AC
  Administered 2019-02-23: 1 mg via INTRAVENOUS
  Filled 2019-02-23: qty 1

## 2019-02-23 MED ORDER — PROMETHAZINE HCL 25 MG/ML IJ SOLN
INTRAMUSCULAR | Status: AC
  Filled 2019-02-23: qty 1

## 2019-02-23 MED ORDER — LIDOCAINE VISCOUS HCL 2 % MT SOLN
15.0000 mL | Freq: Once | OROMUCOSAL | Status: AC
  Administered 2019-02-23: 15 mL via ORAL
  Filled 2019-02-23: qty 15

## 2019-02-23 MED ORDER — PROMETHAZINE HCL 25 MG/ML IJ SOLN
12.5000 mg | Freq: Once | INTRAMUSCULAR | Status: AC
  Administered 2019-02-23: 12.5 mg via INTRAVENOUS

## 2019-02-23 MED ORDER — ONDANSETRON HCL 4 MG/2ML IJ SOLN
4.0000 mg | Freq: Once | INTRAMUSCULAR | Status: AC
  Administered 2019-02-23: 4 mg via INTRAVENOUS
  Filled 2019-02-23: qty 2

## 2019-02-23 MED ORDER — ALUM & MAG HYDROXIDE-SIMETH 200-200-20 MG/5ML PO SUSP
30.0000 mL | Freq: Once | ORAL | Status: AC
  Administered 2019-02-23: 30 mL via ORAL
  Filled 2019-02-23: qty 30

## 2019-02-23 MED ORDER — PANTOPRAZOLE SODIUM 40 MG IV SOLR
40.0000 mg | Freq: Once | INTRAVENOUS | Status: AC
  Administered 2019-02-23: 21:00:00 40 mg via INTRAVENOUS
  Filled 2019-02-23: qty 40

## 2019-02-23 MED ORDER — HYDROMORPHONE HCL 1 MG/ML IJ SOLN
0.5000 mg | Freq: Once | INTRAMUSCULAR | Status: AC
  Administered 2019-02-23: 0.5 mg via INTRAVENOUS
  Filled 2019-02-23: qty 1

## 2019-02-23 MED ORDER — IOHEXOL 350 MG/ML SOLN
100.0000 mL | Freq: Once | INTRAVENOUS | Status: AC | PRN
  Administered 2019-02-23: 100 mL via INTRAVENOUS

## 2019-02-23 MED ORDER — SODIUM CHLORIDE 0.9 % IV BOLUS
1000.0000 mL | Freq: Once | INTRAVENOUS | Status: AC
  Administered 2019-02-23: 1000 mL via INTRAVENOUS

## 2019-02-23 NOTE — ED Triage Notes (Signed)
Ports chest pain, abd pain, nausea and vomiting, rib pain and back pain onset today. Phenergan and vicodin not effective.

## 2019-02-23 NOTE — ED Notes (Signed)
Taken to CT at this time. 

## 2019-02-23 NOTE — ED Notes (Signed)
Pt refused blood draw in triage - wants port accessed. Notified patient that this may delay her care while she is in the lobby. Verbalized understanding.

## 2019-02-23 NOTE — ED Provider Notes (Signed)
[facility] EMERGENCY DEPARTMENT Provider Note   CSN: [id number] Arrival date & time: 02/23/19  1928     History Chief Complaint  Patient presents with  . Chest Pain  . Emesis    Paula [name] is a 54 y.o. female.  Patient with history of stomach cancer in remission, hiatal hernia, esophageal reflux disease, chronic nausea and vomiting here with chest pain and vomiting that onset this morning.  States she started vomiting this morning and has been unable to keep anything down all day.  Emesis is mostly clear with some blood streaks.  Developed chest pain after she started vomiting which radiates to her back and her shoulders.  The pain is fairly constant.  There is no associated shortness of breath, cough or fever.  No leg pain or leg swelling.  She has been taking her usual Phenergan and Vicodin at home without relief.  States she saw her gastroenterology Dr. Lyndel Safe several weeks ago is being scheduled for EGD next week.  Was told she may have an ulcer.  She continues to use marijuana.  The pain is in the center of her chest and radiates to her mid back.  She also has upper abdominal pain.  No focal weakness, numbness or tingling.  No fever or vomiting.  No blood in the stool.  EGD 02/07/2018 [facility] -Salmon-colored mucosa extending from 37 to 38 cm. -Billroth II gastrectomy.  Angular takeoff of both limbs. -Gastritis (Bx- neg) -Single 3 mm gastric polyp at anastomosis (Bx-hyperplastic polyp) -Small amount of food residue in stomach.  Bilious gastric fluid. -Dilated lacteals in the duodenum (Bx- neg, neg for celiac) -No recurrence.  The history is provided by the patient.  Chest Pain Associated symptoms: abdominal pain, back pain, nausea and vomiting   Associated symptoms: no dizziness, no fever, no headache, no shortness of breath and no weakness   Emesis Associated symptoms: abdominal pain, arthralgias and myalgias   Associated symptoms: no fever and no headaches         Past Medical History:  Diagnosis Date  . Arthritis    back  . Costochondritis   . Esophageal reflux    On omeprazole  . Family history of cancer   . Headache(784.0) 2011   Migraines  . Heart murmur    never caused any problems  . History of hiatal hernia   . History of radiation therapy 05/12/14- 06/17/14   post-op gastric region/ nodes/ 45 Gy in 25 fractions.   . Hypertension    started around 2011  . Iron deficiency anemia due to chronic blood loss 05/25/2015  . 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 On antineoplastic chemotherapy    5-FU and leucovorin  . Ovarian cyst   . Pneumonia    x 1  . Pulmonary emboli ([place]) 2015  . Stomach cancer ([place]) dx'd 08/2013  . Tuberculosis    ? [id number]  . Wears partial dentures    upper dentures    Patient Active Problem List   Diagnosis Date Noted  . Right leg pain 10/15/2018  . Concussion with no loss of consciousness 09/10/2018  . Muscle pain 09/10/2018  . S/P vaginal hysterectomy 11/09/2017  . Left knee pain 04/25/2017  . Iron deficiency anemia due to chronic blood loss 05/25/2015  . Healed or old pulmonary embolism 03/31/2015  . Genetic testing 01/23/2015  . Insomnia 06/30/2014  . Neutropenia ([place]) 02/24/2014  . 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 Hypoalbuminemia 12/18/2013  . Malfunction  of device 12/18/2013  . Leg edema 12/18/2013  . Neoplasm related pain 12/18/2013  . Other pancytopenia ([place]) 12/13/2013  . Hyponatremia 12/12/2013  . Hypokalemia 12/12/2013  . Weight loss 12/04/2013  . Abnormal loss of weight 12/04/2013  . Family history of cancer   . Cancer of antrum of stomach ([place]) 11/07/2013  . 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 Palliative care encounter 11/04/2013  . Acute kidney injury ([place]) 11/03/2013  . Chronic low back pain 10/31/2013  . 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 Constipation, chronic 10/28/2013  . Protein-calorie  malnutrition, severe ([place]) 10/23/2013  . Intractable nausea and vomiting 10/22/2013  . Intractable vomiting 10/22/2013  . 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 Carcinoma of stomach ([place]) 09/18/2013  . Antral ulcer 09/16/2013  . Migraine headache 09/14/2013  . PTSD (post-traumatic stress disorder) 08/19/2013  . Left knee injury 07/30/2013  . Mitral regurgitation 02/15/2013  . Chest pain 01/23/2012  . Essential hypertension 01/23/2012  . Murmur 01/23/2012    Past Surgical History:  Procedure Laterality Date  . ABDOMINAL HYSTERECTOMY    . CESAREAN SECTION     x 2  . ESOPHAGOGASTRODUODENOSCOPY N/A 09/16/2013   Procedure: ESOPHAGOGASTRODUODENOSCOPY (EGD);  Surgeon: Wonda Horner, MD;  Location: [facility];  Service: Endoscopy;  Laterality: N/A;  . IR CV LINE INJECTION  08/30/2017  . IR FLUORO GUIDE PORT INSERTION RIGHT  03/21/2017  . IR US GUIDE VASC ACCESS RIGHT  03/21/2017  . LAPAROSCOPY N/A 09/20/2013   Procedure: DIAGNOSTIC LAPAROSCOPY,DISTAL GASTRECTOMY AND FEEDING GASTROJEJUNOSTOMY;  Surgeon: [name], MD;  Location: [facility];  Service: General;  Laterality: N/A;  . OOPHORECTOMY     Around 1985 left ovary removal  . PORTACATH PLACEMENT Left 11/18/2013   Procedure: INSERTION PORT-A-CATH;  Surgeon: [name], MD;  Location: [facility];  Service: General;  Laterality: Left;  . TONSILLECTOMY     Around 1994  . TUBAL LIGATION    . UNILATERAL SALPINGECTOMY Left 11/09/2017   Procedure: UNILATERAL SALPINGECTOMY;  Surgeon: Osborne Oman, MD;  Location: [facility];  Service: Gynecology;  Laterality: Left;  Marland Kitchen VAGINAL HYSTERECTOMY Left 11/09/2017   Procedure: HYSTERECTOMY VAGINAL WITH LEFT SALPINGECTOMY;  Surgeon: Osborne Oman, MD;  Location: [facility];  Service: Gynecology;  Laterality: Left;     OB History    Gravida  2   Para  2   Term  1   Preterm  1   AB      Living  2     SAB      TAB      Ectopic      Multiple       Live Births  2           Family History  Problem Relation Age of Onset  . Hypertension Mother   . Diabetes Mellitus II Mother   . Other Mother        hx of hysterectomy for unspecified reason  . Hypertension Sister   . Hypertension Brother   . Heart failure Maternal Grandmother   . Heart Problems Maternal Grandmother   . Hypertension Maternal Grandfather   . Diabetes Maternal Grandfather   . Colon cancer Paternal Uncle        dx. late 50s-early 60s  . Gastric cancer Cousin 84  . Colon cancer Cousin        dx. early-late 41s; (father also had colon cancer)  . Colon cancer  Paternal Uncle        dx. late 50s-early 92s  . Lung cancer Paternal Uncle        dx. 47s; smoker  . Colon polyps Paternal Uncle        unknown number  . Heart attack Maternal Aunt   . Heart Problems Paternal Aunt   . Heart attack Paternal Grandfather   . Colon cancer Paternal Grandfather        dx. 50s-60s  . Colon polyps Brother        1 maternal half-brother had approx 3 polyps  . Breast cancer Cousin 70  . Breast cancer Cousin        dx. early 52s or earlier (father had colon cancer)  . Lung cancer Other        PGF's brothers (x4); dx. later in life; lim info    Social History   Tobacco Use  . Smoking status: Former Smoker    Packs/day: 1.00    Years: 16.00    Pack years: 16.00    Types: Cigarettes    Quit date: 02/21/1998    Years since quitting: 21.0  . Smokeless tobacco: Never Used  . Tobacco comment: 0.5-1.0 ppd for 16 yrs  Substance Use Topics  . Alcohol use: Yes    Alcohol/week: 0.0 standard drinks    Comment: occasional wine  . Drug use: Yes    Types: Marijuana    Home Medications Prior to Admission medications   Medication Sig Start Date End Date Taking? Authorizing Provider  carvedilol (COREG) 6.25 MG tablet Take 0.5 tablets (3.125 mg total) by mouth 2 (two) times daily. 01/28/19   Lelon Perla, MD  dronabinol (MARINOL) 5 MG capsule Take 1 capsule (5 mg total) by  mouth 2 (two) times daily before lunch and supper. 11/16/18   Volanda Napoleon, MD  ferumoxytol [facility]) 510 MG/17ML SOLN injection Inject 17 mLs (510 mg total) into the vein once for 1 dose. X 2 doses 02/05/19 02/05/19  Jackquline Denmark, MD  fluticasone [facility]) 50 MCG/ACT nasal spray Place 1 spray into both nostrils daily. 12/10/18   Khatri, Hina, PA-C  HYDROcodone-acetaminophen (NORCO/VICODIN) 5-325 MG tablet Take 1 tablet by mouth every 6 (six) hours as needed for moderate pain. 02/12/19   Volanda Napoleon, MD  lidocaine (XYLOCAINE) 2 % solution Use as directed 15 mLs in the mouth or throat as needed for mouth pain. 12/10/18   Khatri, Hina, PA-C  lidocaine-prilocaine (EMLA) cream Apply 1 application topically as needed. 02/05/19   Volanda Napoleon, MD  LORazepam (ATIVAN) 0.5 MG tablet Take 0.5 mg by mouth as needed. 12/10/14   [provider]  nitroGLYCERIN (NITROSTAT) 0.4 MG SL tablet Place 1 tablet (0.4 mg total) under the tongue every 5 (five) minutes as needed for chest pain. 12/20/18 03/20/19  Lendon Colonel, NP  omeprazole (PRILOSEC) 20 MG capsule Take 20 mg by mouth daily.    [provider]  pantoprazole (PROTONIX) 40 MG tablet Take 1 tablet (40 mg total) by mouth daily. 02/04/19   Jackquline Denmark, MD  promethazine (PHENERGAN) 25 MG tablet Take 1 tablet (25 mg total) by mouth every 6 (six) hours as needed for nausea or vomiting. 11/09/18   Volanda Napoleon, MD  SUPREP BOWEL PREP KIT 17.5-3.13-1.6 GM/177ML SOLN Take 1 kit by mouth as directed. 02/05/19   Jackquline Denmark, MD  vitamin B-12 (CYANOCOBALAMIN) 500 MCG tablet Take 2,000 mcg by mouth daily.    [provider]  Allergies    Tape  Review of Systems   Review of Systems  Constitutional: Positive for activity change and appetite change. Negative for fever.  HENT: Negative for congestion and rhinorrhea.   Respiratory: Positive for chest tightness. Negative for shortness of breath.   Cardiovascular:  Positive for chest pain.  Gastrointestinal: Positive for abdominal pain, nausea and vomiting.  Genitourinary: Negative for dysuria and hematuria.  Musculoskeletal: Positive for arthralgias, back pain and myalgias.  Skin: Negative for rash.  Neurological: Negative for dizziness, weakness and headaches.   all other systems are negative except as noted in the HPI and PMH.    Physical Exam Updated Vital Signs BP (!) 125/93   Pulse 94   Temp 99.5 F (37.5 C) (Oral)   Resp 20   Ht _0  (1.549 m)   Wt 53.5 kg   LMP 02/28/2016 (Exact Date)   SpO2 97%   BMI 22.30 kg/m   Physical Exam Vitals and nursing note reviewed.  Constitutional:      General: She is in acute distress.     Appearance: She is well-developed.     Comments: Uncomfortable and dry heaving  HENT:     Head: Normocephalic and atraumatic.     Mouth/Throat:     Pharynx: No oropharyngeal exudate.  Eyes:     Conjunctiva/sclera: Conjunctivae normal.     Pupils: Pupils are equal, round, and reactive to light.  Neck:     Comments: No meningismus. Cardiovascular:     Rate and Rhythm: Normal rate and regular rhythm.     Heart sounds: Normal heart sounds. No murmur.     Comments: Equal peripheral pulses and grip strengths Pulmonary:     Effort: Pulmonary effort is normal. No respiratory distress.     Breath sounds: Normal breath sounds.  Chest:     Chest wall: Tenderness present.  Abdominal:     Palpations: Abdomen is soft.     Tenderness: There is abdominal tenderness. There is no guarding or rebound.     Comments: Epigastric tenderness, no guarding or rebound  Musculoskeletal:        General: No tenderness. Normal range of motion.     Cervical back: Normal range of motion and neck supple.  Skin:    General: Skin is warm.     Capillary Refill: Capillary refill takes less than 2 seconds.  Neurological:     General: No focal deficit present.     Mental Status: She is alert and oriented to person, place, and time.  Mental status is at baseline.     Cranial Nerves: No cranial nerve deficit.     Motor: No abnormal muscle tone.     Coordination: Coordination normal.     Comments:  5/5 strength throughout. CN 2-12 intact.Equal grip strength.   Psychiatric:        Behavior: Behavior normal.     ED Results / Procedures / Treatments   Labs (all labs ordered are listed, but only abnormal results are displayed) Labs Reviewed  COMPREHENSIVE METABOLIC PANEL - Abnormal; Notable for the following components:      Result Value   Glucose, Bld 140 (*)    Calcium 8.6 (*)    AST 14 (*)    All other components within normal limits  RAPID URINE DRUG SCREEN, HOSP PERFORMED - Abnormal; Notable for the following components:   Opiates POSITIVE (*)    Tetrahydrocannabinol POSITIVE (*)    All other components within normal limits  LIPASE,  BLOOD  CBC  URINALYSIS, ROUTINE W REFLEX MICROSCOPIC  TROPONIN I (HIGH SENSITIVITY)  TROPONIN I (HIGH SENSITIVITY)    EKG EKG Interpretation  Date/Time:  Saturday February 23 2019 19:40:59 EST Ventricular Rate:  92 PR Interval:  108 QRS Duration: 72 QT Interval:  332 QTC Calculation: 410 R Axis:   53 Text Interpretation: Sinus rhythm with short PR Left ventricular hypertrophy with repolarization abnormality ( Sokolow-Lyon ) Abnormal ECG Nonspecific T wave abnormality No significant change was found Confirmed by Ezequiel Essex [phone]) on 02/23/2019 8:40:01 PM   Radiology DG Chest 2 View  Result Date: 02/23/2019 CLINICAL DATA:  Chest pain, rib pain EXAM: CHEST - 2 VIEW COMPARISON:  Chest radiographs, 04/24/2015, CT chest, 10/09/2017 FINDINGS: The heart size and mediastinal contours are within normal limits. Right chest port catheter. Unchanged scarring of the right lung base. The visualized skeletal structures are unremarkable. IMPRESSION: No acute abnormality of the lungs. Unchanged scarring of the right lung base. Electronically Signed   By: Eddie Candle M.D.   On:  02/23/2019 20:26   CT Angio Chest/Abd/Pel for Dissection W and/or Wo Contrast  Result Date: 02/23/2019 CLINICAL DATA:  Chest pain. Posterior rib pain. EXAM: CT ANGIOGRAPHY CHEST, ABDOMEN AND PELVIS TECHNIQUE: Multidetector CT imaging through the chest, abdomen and pelvis was performed using the standard protocol during bolus administration of intravenous contrast. Multiplanar reconstructed images and MIPs were obtained and reviewed to evaluate the vascular anatomy. CONTRAST:  164m OMNIPAQUE IOHEXOL 350 MG/ML SOLN COMPARISON:  12/19/2018 FINDINGS: CTA CHEST FINDINGS Cardiovascular: There is no evidence for thoracic aortic aneurysm or dissection. There is no large centrally located pulmonary embolism. Detection of smaller pulmonary emboli is limited by technique. The heart size is normal. There is no significant pericardial effusion. There is a well-positioned Port-A-Cath in place. Mediastinum/Nodes: --No mediastinal or hilar lymphadenopathy. --No axillary lymphadenopathy. --No supraclavicular lymphadenopathy. --Normal thyroid gland. --The esophagus is unremarkable Lungs/Pleura: No pulmonary nodules or masses. No pleural effusion or pneumothorax. No focal airspace consolidation. No focal pleural abnormality. Musculoskeletal: No chest wall abnormality. No acute or significant osseous findings. Review of the MIP images confirms the above findings. CTA ABDOMEN AND PELVIS FINDINGS VASCULAR Aorta: Normal caliber aorta without aneurysm, dissection, vasculitis or significant stenosis. Celiac: Patent without evidence of aneurysm, dissection, vasculitis or significant stenosis. SMA: Patent without evidence of aneurysm, dissection, vasculitis or significant stenosis. Renals: Both renal arteries are patent without evidence of aneurysm, dissection, vasculitis, fibromuscular dysplasia or significant stenosis. IMA: Patent without evidence of aneurysm, dissection, vasculitis or significant stenosis. Inflow: Patent without  evidence of aneurysm, dissection, vasculitis or significant stenosis. Veins: No obvious venous abnormality within the limitations of this arterial phase study. Review of the MIP images confirms the above findings. NON-VASCULAR Lower chest: The lung bases are clear. The heart size is normal. Hepatobiliary: The liver is normal. Normal gallbladder.There is no biliary ductal dilation. Pancreas: Normal contours without ductal dilatation. No peripancreatic fluid collection. Spleen: No splenic laceration or hematoma. Adrenals/Urinary Tract: --Adrenal glands: No adrenal hemorrhage. --Right kidney/ureter: No hydronephrosis or perinephric hematoma. --Left kidney/ureter: No hydronephrosis or perinephric hematoma. --Urinary bladder: Unremarkable. Stomach/Bowel: --Stomach/Duodenum: There are postsurgical changes of the stomach, similar to prior studies. --Small bowel: No dilatation or inflammation. --Colon: No focal abnormality. --Appendix: Normal. Vascular/Lymphatic: Normal course and caliber of the major abdominal vessels. --No retroperitoneal lymphadenopathy. --No mesenteric lymphadenopathy. --No pelvic or inguinal lymphadenopathy. Reproductive: Status post hysterectomy. No adnexal mass. Other: No ascites or free air. The abdominal wall is normal. Musculoskeletal. No acute displaced  fractures. Review of the MIP images confirms the above findings. IMPRESSION: 1. No acute thoracic, abdominal or pelvic pathology. Specifically, no evidence for aortic dissection or aneurysm. 2. Chronic findings as above. Aortic Atherosclerosis (ICD10-I70.0). Electronically Signed   By: Constance Holster M.D.   On: 02/23/2019 23:14    Procedures Procedures (including critical care time)  Medications Ordered in ED Medications  ondansetron (ZOFRAN-ODT) disintegrating tablet 4 mg (4 mg Oral Given 02/23/19 1953)    ED Course  I have reviewed the triage vital signs and the nursing notes.  Pertinent labs & imaging results that were available  during my care of the patient were reviewed by me and considered in my medical decision making (see chart for details).    MDM Rules/Calculators/A&P                     Patient with chest pain that onset after vomiting today.  History of stomach cancer in remission with chronic abdominal pain, nausea and vomiting.  Her chest pain seems atypical for ACS and occurred after vomiting onset.  EKG shows LVH with stable T wave inversions.  Patient given fluids as well as symptom control. LFTs and lipase normal. Troponin negative. CXR negative.   Feels improved with above treatment. CT without any acute aortic or other pathology. Gallbladder normal.  Troponin remains negative.  Chest pain fairly reproducible.  No further vomiting.   Has followup for EGD next week. Continue PPI. Avoid alcohol, caffeine, NSAIDs, spicy foods, THC.  States she has her prescriptions at home. She is aware that the ED cannot provide chronic pain medications  Return precautions discussed.  Final Clinical Impression(s) / ED Diagnoses Final diagnoses:  Non-intractable vomiting with nausea, unspecified vomiting type    Rx / DC Orders ED Discharge Orders    None       Tonni Mansour, Annie Main, MD 02/24/19 0147

## 2019-02-24 DIAGNOSIS — R112 Nausea with vomiting, unspecified: Secondary | ICD-10-CM | POA: Diagnosis not present

## 2019-02-24 LAB — RAPID URINE DRUG SCREEN, HOSP PERFORMED
Amphetamines: NOT DETECTED
Barbiturates: NOT DETECTED
Benzodiazepines: NOT DETECTED
Cocaine: NOT DETECTED
Opiates: POSITIVE — AB
Tetrahydrocannabinol: POSITIVE — AB

## 2019-02-24 MED ORDER — HEPARIN SOD (PORK) LOCK FLUSH 100 UNIT/ML IV SOLN
500.0000 [IU] | Freq: Once | INTRAVENOUS | Status: AC
  Administered 2019-02-24: 500 [IU]
  Filled 2019-02-24: qty 5

## 2019-02-24 NOTE — Discharge Instructions (Signed)
Take your pain or nausea medications as prescribed.  Follow-up with your doctors as scheduled.  Avoid alcohol, NSAIDs, caffeine, spicy foods and marijuana.  Return to the ED with new or worsening symptoms.

## 2019-02-26 ENCOUNTER — Inpatient Hospital Stay: Payer: Medicare Other | Attending: Hematology & Oncology

## 2019-02-26 ENCOUNTER — Other Ambulatory Visit: Payer: Self-pay

## 2019-02-26 VITALS — BP 116/71 | HR 81 | Temp 97.3°F | Resp 17

## 2019-02-26 DIAGNOSIS — D500 Iron deficiency anemia secondary to blood loss (chronic): Secondary | ICD-10-CM

## 2019-02-26 DIAGNOSIS — Z85028 Personal history of other malignant neoplasm of stomach: Secondary | ICD-10-CM | POA: Insufficient documentation

## 2019-02-26 DIAGNOSIS — Z452 Encounter for adjustment and management of vascular access device: Secondary | ICD-10-CM | POA: Insufficient documentation

## 2019-02-26 MED ORDER — SODIUM CHLORIDE 0.9% FLUSH
10.0000 mL | Freq: Once | INTRAVENOUS | Status: AC | PRN
  Administered 2019-02-26: 11:00:00 10 mL
  Filled 2019-02-26: qty 10

## 2019-02-26 MED ORDER — HEPARIN SOD (PORK) LOCK FLUSH 100 UNIT/ML IV SOLN
500.0000 [IU] | Freq: Once | INTRAVENOUS | Status: AC | PRN
  Administered 2019-02-26: 500 [IU]
  Filled 2019-02-26: qty 5

## 2019-02-26 NOTE — Patient Instructions (Signed)

## 2019-03-04 ENCOUNTER — Other Ambulatory Visit: Payer: Self-pay | Admitting: Gastroenterology

## 2019-03-04 ENCOUNTER — Ambulatory Visit (INDEPENDENT_AMBULATORY_CARE_PROVIDER_SITE_OTHER): Payer: Medicare Other

## 2019-03-04 DIAGNOSIS — Z1159 Encounter for screening for other viral diseases: Secondary | ICD-10-CM

## 2019-03-05 ENCOUNTER — Telehealth: Payer: Self-pay | Admitting: Internal Medicine

## 2019-03-05 LAB — SARS CORONAVIRUS 2 (TAT 6-24 HRS): SARS Coronavirus 2: NEGATIVE

## 2019-03-05 NOTE — Telephone Encounter (Signed)
She vomited with Suprep and says she is not able to take large quantities of liquid.  We discussed trying a MiraLax prep and possibly Clebn-Piq - she prefers to postpone colonoscopy and have the EGD tomorrow.  We talked about possible Reglan or perhaps new metaclopramide dissolvable tablets to take during prep, ? If that and Clen-piq lw volume would work

## 2019-03-06 ENCOUNTER — Other Ambulatory Visit: Payer: Self-pay

## 2019-03-06 ENCOUNTER — Ambulatory Visit (AMBULATORY_SURGERY_CENTER): Payer: Medicare Other | Admitting: Gastroenterology

## 2019-03-06 ENCOUNTER — Encounter: Payer: Self-pay | Admitting: Gastroenterology

## 2019-03-06 VITALS — BP 122/83 | HR 75 | Temp 96.9°F | Resp 21 | Ht 61.0 in | Wt 123.0 lb

## 2019-03-06 DIAGNOSIS — K3189 Other diseases of stomach and duodenum: Secondary | ICD-10-CM | POA: Diagnosis not present

## 2019-03-06 DIAGNOSIS — R112 Nausea with vomiting, unspecified: Secondary | ICD-10-CM | POA: Diagnosis not present

## 2019-03-06 DIAGNOSIS — K2950 Unspecified chronic gastritis without bleeding: Secondary | ICD-10-CM

## 2019-03-06 DIAGNOSIS — K2270 Barrett's esophagus without dysplasia: Secondary | ICD-10-CM | POA: Diagnosis not present

## 2019-03-06 MED ORDER — SODIUM CHLORIDE 0.9 % IV SOLN: 500.0000 mL | Freq: Once | INTRAVENOUS | Status: DC

## 2019-03-06 MED ORDER — SUCRALFATE 1 GM/10ML PO SUSP: 1.0000 g | Freq: Four times a day (QID) | ORAL | 1 refills | Status: DC

## 2019-03-06 MED ORDER — SODIUM CHLORIDE 0.9 % IV SOLN: 4.0000 mg | Freq: Once | INTRAVENOUS | Status: DC

## 2019-03-06 MED ORDER — SODIUM CHLORIDE 0.9 % IV SOLN
4.0000 mg | Freq: Once | INTRAVENOUS | Status: AC
  Administered 2019-03-06: 12:00:00 4 mg via INTRAVENOUS

## 2019-03-06 NOTE — Patient Instructions (Signed)
Add Carafate elixir 4 times a day for two weeks.  No aspirin, ibuprofen, naproxen, or other NSAIDs Await pathology results.   YOU HAD AN ENDOSCOPIC PROCEDURE TODAY AT [facility]:   Refer to the procedure report that was given to you for any specific questions about what was found during the examination.  If the procedure report does not answer your questions, please call your gastroenterologist to clarify.  If you requested that your care partner not be given the details of your procedure findings, then the procedure report has been included in a sealed envelope for you to review at your convenience later.  YOU SHOULD EXPECT: Some feelings of bloating in the abdomen. Passage of more gas than usual.  Walking can help get rid of the air that was put into your GI tract during the procedure and reduce the bloating. If you had a lower endoscopy (such as a colonoscopy or flexible sigmoidoscopy) you may notice spotting of blood in your stool or on the toilet paper. If you underwent a bowel prep for your procedure, you may not have a normal bowel movement for a few days.  Please Note:  You might notice some irritation and congestion in your nose or some drainage.  This is from the oxygen used during your procedure.  There is no need for concern and it should clear up in a day or so.  SYMPTOMS TO REPORT IMMEDIATELY:    Following upper endoscopy (EGD)  Vomiting of blood or coffee ground material  New chest pain or pain under the shoulder blades  Painful or persistently difficult swallowing  New shortness of breath  Fever of 100F or higher  Black, tarry-looking stools  For urgent or emergent issues, a gastroenterologist can be reached at any hour by calling [phone].   DIET:  We do recommend a small meal at first, but then you may proceed to your regular diet.  Drink plenty of fluids but you should avoid alcoholic beverages for 24 hours.  ACTIVITY:  You should plan to take it  easy for the rest of today and you should NOT DRIVE or use heavy machinery until tomorrow (because of the sedation medicines used during the test).    FOLLOW UP: Our staff will call the number listed on your records 48-72 hours following your procedure to check on you and address any questions or concerns that you may have regarding the information given to you following your procedure. If we do not reach you, we will leave a message.  We will attempt to reach you two times.  During this call, we will ask if you have developed any symptoms of COVID 19. If you develop any symptoms (ie: fever, flu-like symptoms, shortness of breath, cough etc.) before then, please call [phone].  If you test positive for Covid 19 in the 2 weeks post procedure, please call and report this information to [place].    If any biopsies were taken you will be contacted by phone or by letter within the next 1-3 weeks.  Please call us at [phone] if you have not heard about the biopsies in 3 weeks.    SIGNATURES/CONFIDENTIALITY: You and/or your care partner have signed paperwork which will be entered into your electronic medical record.  These signatures attest to the fact that that the information above on your After Visit Summary has been reviewed and is understood.  Full responsibility of the confidentiality of this discharge information lies with you and/or your  care-partner.

## 2019-03-06 NOTE — Progress Notes (Signed)
At 1135 patient became nauseous with retching. 4mg  IV zofran given with relief of symptoms. Heat pack given and placed on patients back.

## 2019-03-06 NOTE — Progress Notes (Signed)
To PACU, VSS. Report to [email or web address] 

## 2019-03-06 NOTE — Op Note (Signed)
[facility] Patient Name: Paula [name] Procedure Date: 03/06/2019 10:54 AM MRN: [id number] Endoscopist: Jackquline Denmark , MD Age: 54 Referring MD:  Date of Birth: 09-Apr-1965 Gender: Female Account #: [id number] Procedure:                Upper GI endoscopy Indications:              #1. N/V/wt loss - ? Etiology. Neg CT AP 12/19/2018                            for recurrence of gastric cancer. Could represent                            cannabis hyperemesis syndrome, d/t                            narcotics/gastroparesis, r/o anastomotic ulcers or                            any recurrence. Nl TSH                           #2. Stage IIA (T1a, N2, M0) gastric AdenoCa s/p BII                            gastrectomy/feeding J tube July 2015 followed by                            neoadjuvant chemoradiation. Neg CT AP 12/19/2018.                            EGD 01/2018 @ [facility]-as below. Neg for recurrence.                           #3. IDA d/t Billroth II gastrectomy. Medicines:                Monitored Anesthesia Care Procedure:                Pre-Anesthesia Assessment:                           - Prior to the procedure, a History and Physical                            was performed, and patient medications and                            allergies were reviewed. The patient's tolerance of                            previous anesthesia was also reviewed. The risks                            and benefits of the procedure and the sedation  options and risks were discussed with the patient.                            All questions were answered, and informed consent                            was obtained. Prior Anticoagulants: The patient has                            taken no previous anticoagulant or antiplatelet                            agents. ASA Grade Assessment: II - A patient with                            mild systemic disease. After  reviewing the risks                            and benefits, the patient was deemed in                            satisfactory condition to undergo the procedure.                           After obtaining informed consent, the endoscope was                            passed under direct vision. Throughout the                            procedure, the patient's blood pressure, pulse, and                            oxygen saturations were monitored continuously. The                            Endoscope was introduced through the mouth, and                            advanced to the second part of duodenum. The upper                            GI endoscopy was accomplished without difficulty.                            The patient tolerated the procedure well. Scope In: Scope Out: Findings:                 The examined esophagus was normal. Tongue of                            salmon-colored mucosa standing from 36 up to 38 cm                            (  GE Jn). Biopsies were obtained directed by NBI.                           Evidence of a patent Billroth II gastrojejunostomy                            was found. The gastric remnant measured 10 cm.                            Moderate erythema, friability which would bleed                            easily to touch. Some retained solid vegetable                            material. Multiple biopsies were obtained from the                            gastric remnant. No recurrence. Gastrojejunal                            anastomosis was characterized by healthy appearing                            mucosa. It was at an angle. Both limbs were                            intubated. No stenosis, strictures or anastomotic                            ulcers. Complications:            No immediate complications. Estimated Blood Loss:     Estimated blood loss: none. Impression:               - Moderate gastritis. (Likely etiology of recurrent                             anemia).                           - Likely 2 cm Barrett's esophagus (biopsied)                           - Patent Billroth II gastrojejunostomy with normal                            afferent/efferent limbs. Did have angular takeoff. Recommendation:           - Patient has a contact number available for                            emergencies. The signs and symptoms of potential                            delayed complications were discussed with  the                            patient. Return to normal activities tomorrow.                            Written discharge instructions were provided to the                            patient.                           - Resume previous diet.                           - Protonix 40 mg p.o. once a day.                           - Add Carafate elixir 1 g p.o. 4 times daily x 2                            weeks.                           - If continued problems, obtain upper GI series                            followed by [facility]. If still with problems, may                            consider surgical evaluation for revision                            (conversion to Roux-en-Y).                           - I do believe that she would be dependent upon IV                            iron and may require intermittent blood                            transfusions.                           - Continue present medications.                           - Minimize marijuana                           - Await pathology results.                           - No aspirin, ibuprofen, naproxen, or other  non-steroidal anti-inflammatory drugs.                           - Return to GI clinic in 6 weeks. Jackquline Denmark, MD 03/06/2019 11:38:40 AM This report has been signed electronically.

## 2019-03-06 NOTE — Progress Notes (Signed)
Temp  JB  VS DT  Pt's states no medical or surgical changes since previsit or office visit. Admitting RN reviewed   

## 2019-03-06 NOTE — Progress Notes (Signed)
Called to room to assist during endoscopic procedure.  Patient ID and intended procedure confirmed with present staff. Received instructions for my participation in the procedure from the performing physician.  

## 2019-03-08 ENCOUNTER — Telehealth: Payer: Self-pay | Admitting: *Deleted

## 2019-03-08 NOTE — Telephone Encounter (Signed)
  Follow up Call-  Call back number 03/06/2019  Post procedure Call Back phone  # [phone]  Permission to leave phone message Yes  Some recent data might be hidden     Patient questions:  Do you have a fever, pain , or abdominal swelling? No. Pain Score  0 *  Have you tolerated food without any problems? Yes.    Have you been able to return to your normal activities? Yes.    Do you have any questions about your discharge instructions: Diet   No. Medications  No. Follow up visit  No.  Do you have questions or concerns about your Care? Yes.  - patient asking when IV Iron will be initiated per MD's report recommendations. C/o blister in roof of mouth after procedure. Instructed patient this could be a result of the scope scraping that area of her mouth during the procedure. Patient also states she is taking "gerital" and states it burns her stomach and is asking if she should continue to take this medication. Will notify Dr. Garrel Ridgel of patient's questions.   Actions: * If pain score is 4 or above: No action needed, pain <4.  1. Have you developed a fever since your procedure? no  2.   Have you had an respiratory symptoms (SOB or cough) since your procedure? no  3.   Have you tested positive for COVID 19 since your procedure no  4.   Have you had any family members/close contacts diagnosed with the COVID 19 since your procedure?  no   If yes to any of these questions please route to Joylene John, RN and Alphonsa Gin, Therapist, sports.

## 2019-03-08 NOTE — Telephone Encounter (Signed)
Thank you Glendell Docker for taking care of her RG

## 2019-03-10 ENCOUNTER — Encounter: Payer: Self-pay | Admitting: Gastroenterology

## 2019-03-12 ENCOUNTER — Telehealth: Payer: Self-pay | Admitting: Gastroenterology

## 2019-03-12 DIAGNOSIS — D500 Iron deficiency anemia secondary to blood loss (chronic): Secondary | ICD-10-CM

## 2019-03-12 DIAGNOSIS — R112 Nausea with vomiting, unspecified: Secondary | ICD-10-CM

## 2019-03-13 NOTE — Telephone Encounter (Signed)
Please review patient message and advise-IV iron?

## 2019-03-14 ENCOUNTER — Other Ambulatory Visit (INDEPENDENT_AMBULATORY_CARE_PROVIDER_SITE_OTHER): Payer: Medicare Other

## 2019-03-14 DIAGNOSIS — R112 Nausea with vomiting, unspecified: Secondary | ICD-10-CM

## 2019-03-14 DIAGNOSIS — D500 Iron deficiency anemia secondary to blood loss (chronic): Secondary | ICD-10-CM | POA: Diagnosis not present

## 2019-03-14 LAB — CBC WITH DIFFERENTIAL/PLATELET
Basophils Absolute: 0 10*3/uL (ref 0.0–0.1)
Basophils Relative: 0.8 % (ref 0.0–3.0)
Eosinophils Absolute: 0.1 10*3/uL (ref 0.0–0.7)
Eosinophils Relative: 1.3 % (ref 0.0–5.0)
HCT: 37.7 % (ref 36.0–46.0)
Hemoglobin: 12.3 g/dL (ref 12.0–15.0)
Lymphocytes Relative: 31.7 % (ref 12.0–46.0)
Lymphs Abs: 1.3 10*3/uL (ref 0.7–4.0)
MCHC: 32.5 g/dL (ref 30.0–36.0)
MCV: 91.9 fl (ref 78.0–100.0)
Monocytes Absolute: 0.4 10*3/uL (ref 0.1–1.0)
Monocytes Relative: 10.1 % (ref 3.0–12.0)
Neutro Abs: 2.3 10*3/uL (ref 1.4–7.7)
Neutrophils Relative %: 56.1 % (ref 43.0–77.0)
Platelets: 240 10*3/uL (ref 150.0–400.0)
RBC: 4.1 Mil/uL (ref 3.87–5.11)
RDW: 13.7 % (ref 11.5–15.5)
WBC: 4.1 10*3/uL (ref 4.0–10.5)

## 2019-03-14 LAB — IBC + FERRITIN
Ferritin: 129 ng/mL (ref 10.0–291.0)
Iron: 59 ug/dL (ref 42–145)
Saturation Ratios: 26.8 % (ref 20.0–50.0)
Transferrin: 157 mg/dL — ABNORMAL LOW (ref 212.0–360.0)

## 2019-03-14 LAB — COMPREHENSIVE METABOLIC PANEL
ALT: 10 U/L (ref 0–35)
AST: 12 U/L (ref 0–37)
Albumin: 4 g/dL (ref 3.5–5.2)
Alkaline Phosphatase: 60 U/L (ref 39–117)
BUN: 11 mg/dL (ref 6–23)
CO2: 31 mEq/L (ref 19–32)
Calcium: 9 mg/dL (ref 8.4–10.5)
Chloride: 103 mEq/L (ref 96–112)
Creatinine, Ser: 0.97 mg/dL (ref 0.40–1.20)
GFR: 72.9 mL/min (ref >60.00)
Glucose, Bld: 78 mg/dL (ref 70–99)
Potassium: 5.2 mEq/L — ABNORMAL HIGH (ref 3.5–5.1)
Sodium: 138 mEq/L (ref 135–145)
Total Bilirubin: 0.3 mg/dL (ref 0.2–1.2)
Total Protein: 6.6 g/dL (ref 6.0–8.3)

## 2019-03-14 MED ORDER — ONDANSETRON 4 MG PO TBDP: 4.0000 mg | ORAL_TABLET | Freq: Three times a day (TID) | ORAL | 0 refills | Status: DC | PRN

## 2019-03-14 NOTE — Telephone Encounter (Signed)
Called and spoke with patient -requesting to have alternative medication to Phenergan as it is causing drowsiness and tiredness (in addition current fatigue symptoms) -requesting to have labs ordered NOW-"I am just feeling so tired and fatigued and I need to start feeling better-something has got to improve these feelings"; Please advise

## 2019-03-14 NOTE — Telephone Encounter (Signed)
Hb back to normal 12.3 Does not need IV iron yet. Lets recheck CBC, iron studies in 6 weeks. Then, if needed, will order.  RG

## 2019-03-14 NOTE — Telephone Encounter (Signed)
Called and spoke with patient-patient informed of MD recommendations; patient is agreeable with plan of care and verified pharmacy; RX sent to pharmacy; Patient to complete lab work as soon as she is able; Patient verbalized understanding of information/instructions;  Patient was advised to call the office at [phone] if questions/concerns arise;

## 2019-03-14 NOTE — Telephone Encounter (Signed)
Sure Lets get CBC, CMP and iron studies. Give Zofran 4 mg ODT every 8 hours as needed, 30. RG

## 2019-03-15 ENCOUNTER — Other Ambulatory Visit: Payer: Self-pay

## 2019-03-15 ENCOUNTER — Telehealth: Payer: Self-pay | Admitting: Gastroenterology

## 2019-03-15 ENCOUNTER — Other Ambulatory Visit: Payer: Self-pay | Admitting: *Deleted

## 2019-03-15 DIAGNOSIS — R112 Nausea with vomiting, unspecified: Secondary | ICD-10-CM

## 2019-03-15 DIAGNOSIS — D500 Iron deficiency anemia secondary to blood loss (chronic): Secondary | ICD-10-CM

## 2019-03-15 DIAGNOSIS — C163 Malignant neoplasm of pyloric antrum: Secondary | ICD-10-CM

## 2019-03-15 DIAGNOSIS — C169 Malignant neoplasm of stomach, unspecified: Secondary | ICD-10-CM

## 2019-03-15 MED ORDER — HYDROCODONE-ACETAMINOPHEN 5-325 MG PO TABS: 1.0000 | ORAL_TABLET | Freq: Four times a day (QID) | ORAL | 0 refills | Status: DC | PRN

## 2019-03-15 NOTE — Telephone Encounter (Signed)
Please see additional documentation

## 2019-03-21 ENCOUNTER — Telehealth: Payer: Self-pay | Admitting: Gastroenterology

## 2019-03-21 NOTE — Telephone Encounter (Signed)
Called and spoke with patient-patient is requesting to speak with Dr. Lyndel Safe concerning the letter she received -she is needing answers to a few questions and needs to know what medications to take for this dx-please call her at your earliest convenience;

## 2019-03-29 NOTE — Telephone Encounter (Signed)
Have tried calling her twice before I have again called her today and left detailed message on her answering machine.  RG

## 2019-03-29 NOTE — Telephone Encounter (Signed)
Please review previous message and advise 

## 2019-03-29 NOTE — Telephone Encounter (Signed)
I have discussed with the patient over the phone. She still has similar problems. We have gone over the previous endoscopy results. I explained her that she has short segment Barrett's esophagus-very little potential for her to turn into esophageal cancer.  Still we will keep an eye on it by endoscopy every 34yrs. She still has problems with nausea/vomiting but seems to be somewhat better  She would like to hold off on upper GI series/gastric emptying scan.   She will also make appointment with [facility].  She follows up there as well.  RG

## 2019-03-29 NOTE — Telephone Encounter (Signed)
Pt is calling back from previous conversation with Bri.  She is upset that Dr. Lyndel Safe has not called her back.  She stated she has been waiting over a week for a call back from the MD and at her next appt with her oncologist on 04/08/2019 she is going to be asked to be referred to another GI office and this office "will get a bad review"

## 2019-04-08 ENCOUNTER — Inpatient Hospital Stay: Payer: Medicare Other | Admitting: Hematology & Oncology

## 2019-04-08 ENCOUNTER — Inpatient Hospital Stay: Payer: Medicare Other

## 2019-04-15 ENCOUNTER — Other Ambulatory Visit: Payer: Self-pay | Admitting: *Deleted

## 2019-04-15 DIAGNOSIS — C163 Malignant neoplasm of pyloric antrum: Secondary | ICD-10-CM

## 2019-04-15 DIAGNOSIS — C169 Malignant neoplasm of stomach, unspecified: Secondary | ICD-10-CM

## 2019-04-15 MED ORDER — HYDROCODONE-ACETAMINOPHEN 5-325 MG PO TABS: 1.0000 | ORAL_TABLET | Freq: Four times a day (QID) | ORAL | 0 refills | Status: DC | PRN

## 2019-04-18 ENCOUNTER — Ambulatory Visit (INDEPENDENT_AMBULATORY_CARE_PROVIDER_SITE_OTHER): Payer: Medicare Other

## 2019-04-18 ENCOUNTER — Other Ambulatory Visit (HOSPITAL_COMMUNITY)
Admission: RE | Admit: 2019-04-18 | Discharge: 2019-04-18 | Disposition: A | Discharge status: Home / Self Care | Payer: Medicare Other | Source: Ambulatory Visit | Attending: Family Medicine | Admitting: Family Medicine

## 2019-04-18 ENCOUNTER — Other Ambulatory Visit: Payer: Self-pay

## 2019-04-18 DIAGNOSIS — N898 Other specified noninflammatory disorders of vagina: Secondary | ICD-10-CM | POA: Insufficient documentation

## 2019-04-18 DIAGNOSIS — N390 Urinary tract infection, site not specified: Secondary | ICD-10-CM

## 2019-04-18 LAB — POCT URINALYSIS DIP (DEVICE)
Bilirubin Urine: NEGATIVE
Glucose, UA: NEGATIVE mg/dL
Hgb urine dipstick: NEGATIVE
Ketones, ur: NEGATIVE mg/dL
Leukocytes,Ua: NEGATIVE
Nitrite: NEGATIVE
Protein, ur: NEGATIVE mg/dL
Specific Gravity, Urine: 1.025 (ref 1.005–1.030)
Urobilinogen, UA: 0.2 mg/dL (ref 0.0–1.0)
pH: 7 (ref 5.0–8.0)

## 2019-04-18 NOTE — Progress Notes (Signed)
Pt here today with report of white vaginal discharge that she has not had before.  Pt also states that she has been drinking a lot of tea and her urine seems to be concentrated and dark.  Pt explained how to obtain self swab and that we will call with abnormal results.  Pt also informed her UA resulted normal to monitor her diet as it change the appearance and odor of her urine.  Pt verbalized understanding.   Mel Almond, RN 04/18/19

## 2019-04-19 LAB — CERVICOVAGINAL ANCILLARY ONLY
Bacterial Vaginitis (gardnerella): NEGATIVE
Candida Glabrata: POSITIVE — AB
Candida Vaginitis: POSITIVE — AB
Chlamydia: NEGATIVE
Comment: NEGATIVE
Comment: NEGATIVE
Comment: NEGATIVE
Comment: NEGATIVE
Comment: NEGATIVE
Comment: NORMAL
Neisseria Gonorrhea: NEGATIVE
Trichomonas: NEGATIVE

## 2019-04-22 ENCOUNTER — Other Ambulatory Visit: Payer: Self-pay | Admitting: Obstetrics and Gynecology

## 2019-04-22 MED ORDER — MICONAZOLE NITRATE 2 % VA CREA: 1.0000 | TOPICAL_CREAM | Freq: Every day | VAGINAL | 2 refills | Status: DC

## 2019-04-22 NOTE — Progress Notes (Signed)
Patient seen and assessed by nursing staff during this encounter. I have reviewed the chart and agree with the documentation and plan.  Aletha Halim, MD 04/22/2019 9:34 AM

## 2019-04-29 ENCOUNTER — Telehealth: Payer: Self-pay

## 2019-04-29 MED ORDER — MICONAZOLE NITRATE 2 % VA CREA: 1.0000 | TOPICAL_CREAM | Freq: Every day | VAGINAL | 2 refills | Status: DC

## 2019-04-29 NOTE — Telephone Encounter (Signed)
Pt called requesting results from a week ago.  I informed pt that her results showed that she has a yeast infection.  Pt states that she does not understand why she was not called about the results.  Per chart review, pt was sent Terazol cream by Dr. Ilda Basset to [facility] in [place].  Pt states that she does recall getting a call from the pharmacy however she was unsure of the provider and that she usually does not use that pharmacy for those type of prescriptions.  I informed pt that I have resent the tx for her yeast infection to the pharmacy of her chose and apologized for not contacting her.  Pt verbalized understanding.   Mel Almond, RN

## 2019-04-30 ENCOUNTER — Other Ambulatory Visit: Payer: Self-pay

## 2019-04-30 ENCOUNTER — Inpatient Hospital Stay: Payer: Medicare Other

## 2019-04-30 ENCOUNTER — Other Ambulatory Visit: Payer: Self-pay | Admitting: Family

## 2019-04-30 ENCOUNTER — Encounter: Payer: Self-pay | Admitting: Family

## 2019-04-30 ENCOUNTER — Telehealth: Payer: Self-pay | Admitting: Cardiology

## 2019-04-30 ENCOUNTER — Inpatient Hospital Stay: Payer: Medicare Other | Attending: Hematology & Oncology | Admitting: Family

## 2019-04-30 VITALS — BP 121/35 | HR 77 | Temp 97.1°F | Resp 19 | Ht 61.0 in | Wt 108.0 lb

## 2019-04-30 VITALS — BP 121/85 | HR 68 | Temp 98.0°F | Resp 18

## 2019-04-30 DIAGNOSIS — M545 Low back pain: Secondary | ICD-10-CM | POA: Diagnosis not present

## 2019-04-30 DIAGNOSIS — R61 Generalized hyperhidrosis: Secondary | ICD-10-CM | POA: Insufficient documentation

## 2019-04-30 DIAGNOSIS — R634 Abnormal weight loss: Secondary | ICD-10-CM | POA: Insufficient documentation

## 2019-04-30 DIAGNOSIS — Z85028 Personal history of other malignant neoplasm of stomach: Secondary | ICD-10-CM | POA: Insufficient documentation

## 2019-04-30 DIAGNOSIS — M25551 Pain in right hip: Secondary | ICD-10-CM | POA: Diagnosis not present

## 2019-04-30 DIAGNOSIS — F419 Anxiety disorder, unspecified: Secondary | ICD-10-CM | POA: Insufficient documentation

## 2019-04-30 DIAGNOSIS — C163 Malignant neoplasm of pyloric antrum: Secondary | ICD-10-CM | POA: Diagnosis not present

## 2019-04-30 DIAGNOSIS — T451X5A Adverse effect of antineoplastic and immunosuppressive drugs, initial encounter: Secondary | ICD-10-CM

## 2019-04-30 DIAGNOSIS — R5383 Other fatigue: Secondary | ICD-10-CM | POA: Diagnosis not present

## 2019-04-30 DIAGNOSIS — Z95828 Presence of other vascular implants and grafts: Secondary | ICD-10-CM

## 2019-04-30 DIAGNOSIS — C169 Malignant neoplasm of stomach, unspecified: Secondary | ICD-10-CM | POA: Diagnosis not present

## 2019-04-30 DIAGNOSIS — R630 Anorexia: Secondary | ICD-10-CM

## 2019-04-30 DIAGNOSIS — N951 Menopausal and female climacteric states: Secondary | ICD-10-CM | POA: Insufficient documentation

## 2019-04-30 DIAGNOSIS — D500 Iron deficiency anemia secondary to blood loss (chronic): Secondary | ICD-10-CM

## 2019-04-30 DIAGNOSIS — K909 Intestinal malabsorption, unspecified: Secondary | ICD-10-CM | POA: Diagnosis not present

## 2019-04-30 DIAGNOSIS — R110 Nausea: Secondary | ICD-10-CM

## 2019-04-30 DIAGNOSIS — E8809 Other disorders of plasma-protein metabolism, not elsewhere classified: Secondary | ICD-10-CM

## 2019-04-30 LAB — CMP (CANCER CENTER ONLY)
ALT: 13 U/L (ref 0–44)
AST: 12 U/L — ABNORMAL LOW (ref 15–41)
Albumin: 4.3 g/dL (ref 3.5–5.0)
Alkaline Phosphatase: 54 U/L (ref 38–126)
Anion gap: 6 (ref 5–15)
BUN: 13 mg/dL (ref 6–20)
CO2: 30 mmol/L (ref 22–32)
Calcium: 9.6 mg/dL (ref 8.9–10.3)
Chloride: 103 mmol/L (ref 98–111)
Creatinine: 0.77 mg/dL (ref 0.44–1.00)
GFR, Est AFR Am: >60 mL/min (ref >60)
GFR, Estimated: >60 mL/min (ref >60)
Glucose, Bld: 95 mg/dL (ref 70–99)
Potassium: 3.9 mmol/L (ref 3.5–5.1)
Sodium: 139 mmol/L (ref 135–145)
Total Bilirubin: 1 mg/dL (ref 0.3–1.2)
Total Protein: 6.6 g/dL (ref 6.5–8.1)

## 2019-04-30 LAB — RETICULOCYTES
Immature Retic Fract: 8.2 % (ref 2.3–15.9)
RBC.: 3.72 MIL/uL — ABNORMAL LOW (ref 3.87–5.11)
Retic Count, Absolute: 55.4 10*3/uL (ref 19.0–186.0)
Retic Ct Pct: 1.5 % (ref 0.4–3.1)

## 2019-04-30 LAB — FERRITIN: Ferritin: 184 ng/mL (ref 11–307)

## 2019-04-30 LAB — IRON AND TIBC
Iron: 112 ug/dL (ref 41–142)
Saturation Ratios: 51 % (ref 21–57)
TIBC: 219 ug/dL — ABNORMAL LOW (ref 236–444)
UIBC: 107 ug/dL — ABNORMAL LOW (ref 120–384)

## 2019-04-30 LAB — CBC WITH DIFFERENTIAL (CANCER CENTER ONLY)
Abs Immature Granulocytes: 0.04 10*3/uL (ref 0.00–0.07)
Basophils Absolute: 0 10*3/uL (ref 0.0–0.1)
Basophils Relative: 1 %
Eosinophils Absolute: 0.1 10*3/uL (ref 0.0–0.5)
Eosinophils Relative: 2 %
HCT: 34 % — ABNORMAL LOW (ref 36.0–46.0)
Hemoglobin: 10.8 g/dL — ABNORMAL LOW (ref 12.0–15.0)
Immature Granulocytes: 1 %
Lymphocytes Relative: 29 %
Lymphs Abs: 1.5 10*3/uL (ref 0.7–4.0)
MCH: 28.7 pg (ref 26.0–34.0)
MCHC: 31.8 g/dL (ref 30.0–36.0)
MCV: 90.4 fL (ref 80.0–100.0)
Monocytes Absolute: 0.5 10*3/uL (ref 0.1–1.0)
Monocytes Relative: 9 %
Neutro Abs: 3.1 10*3/uL (ref 1.7–7.7)
Neutrophils Relative %: 58 %
Platelet Count: 241 10*3/uL (ref 150–400)
RBC: 3.76 MIL/uL — ABNORMAL LOW (ref 3.87–5.11)
RDW: 13.6 % (ref 11.5–15.5)
WBC Count: 5.2 10*3/uL (ref 4.0–10.5)
nRBC: 0 % (ref 0.0–0.2)

## 2019-04-30 LAB — TSH: TSH: 0.831 u[IU]/mL (ref 0.308–3.960)

## 2019-04-30 MED ORDER — HEPARIN SOD (PORK) LOCK FLUSH 100 UNIT/ML IV SOLN
500.0000 [IU] | Freq: Once | INTRAVENOUS | Status: AC
  Administered 2019-04-30: 500 [IU] via INTRAVENOUS
  Filled 2019-04-30: qty 5

## 2019-04-30 MED ORDER — DRONABINOL 5 MG PO CAPS: 5.0000 mg | ORAL_CAPSULE | Freq: Two times a day (BID) | ORAL | 0 refills | Status: DC

## 2019-04-30 MED ORDER — SODIUM CHLORIDE 0.9% FLUSH
10.0000 mL | INTRAVENOUS | Status: DC | PRN
  Administered 2019-04-30: 10 mL via INTRAVENOUS
  Filled 2019-04-30: qty 10

## 2019-04-30 NOTE — Telephone Encounter (Signed)
Paula [name] is returning phone call.

## 2019-04-30 NOTE — Progress Notes (Signed)
Hematology and Oncology Follow Up Visit  Paula [name] [id number] [birth year] 54 y.o. 04/30/2019   Principle Diagnosis:  Stage IIA (T1a, N2, M0) gastric adenocarcinoma diagnosed in July of 2015 Intermittent iron deficiency anemia  Past Therapy: Status post diagnostic laparoscopy, distal gastrectomy with billroth II anastamosis and feeding gastrojejunostomy tube under the care of Dr. Barry Dienes on 09/20/2013 Adjuvant systemic chemotherapy with 5-FU 600 mg/M2 with leucovorin on days 1, 8, 15, 22, 29 and 36 every 8 weeks Adjuvant concurrent chemoradiation with Xeloda 650 mg/M2 by mouth twice a day for 5 days every week with radiation. The patient completed the course of radiotherapy but she declined to take Xeloda during her radiation  Current Therapy:   Observation IV iron as indicated   Interim History:  Paula [name] is here today for follow-up. She is having a hard time right now with right hip and lower back pain. She is scheduled to have an MRI on 3/19 and has been receiving injections (most recent was 04/26/2019) with ortho.  Her most recent CT scan (02/23/2019) of the chest, abdomen and pelvis showed no evidence of new or recurrent malignancy.   She states that she has no appetite and her weight is down 15 lbs since we saw her in mid January. She states that she has not taken the Marinol in a long time.  She does feel that she is hydrating appropriately.  She has fatigue. Her Hgb today is down at 10.8, MCV 90 and platelet count 241.  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 She feels that this has helped quite a bit.  She has occasional palpitations she feels may be due to anxiety. She plans to let her cardiologist know.  No fever, chills, n/v, cough, rash, dizziness, SOB, chest pain, abdominal pain or changes in bowel or bladder habits.  She takes Miralax as needed for constipation.  She has noted hot flashes and night  sweats off and on over the last week. These are new and she states that they are mild and tolerable.  No swelling, numbness or tingling in her extremities.  No falls or syncopal episodes to report.  No episodes of bleeding. She has noted some bruising on her shins. No petechiae.   ECOG Performance Status: 1 - Symptomatic but completely ambulatory  Medications:  Allergies as of 04/30/2019      Reactions   Tape Itching   Ok with adhesive tape, ok with paper tape per patient      Medication List       Accurate as of April 30, 2019  8:54 AM. If you have any questions, ask your nurse or doctor.        carvedilol 6.25 MG tablet Commonly known as: COREG Take 0.5 tablets (3.125 mg total) by mouth 2 (two) times daily.   dronabinol 5 MG capsule Commonly known as: MARINOL Take 1 capsule (5 mg total) by mouth 2 (two) times daily before lunch and supper.   Feraheme 510 MG/17ML Soln injection Generic drug: ferumoxytol Inject 17 mLs (510 mg total) into the vein once for 1 dose. X 2 doses   fluticasone 50 MCG/ACT nasal spray Commonly known as: FLONASE Place 1 spray into both nostrils daily.   HYDROcodone-acetaminophen 5-325 MG tablet Commonly known as: NORCO/VICODIN Take 1 tablet by mouth every 6 (six) hours as needed for moderate pain.   lidocaine 2 % solution Commonly known as: XYLOCAINE Use as directed 15 mLs in  the mouth or throat as needed for mouth pain.   lidocaine-prilocaine cream Commonly known as: EMLA Apply 1 application topically as needed.   LORazepam 0.5 MG tablet Commonly known as: ATIVAN Take 0.5 mg by mouth as needed.   miconazole 2 % vaginal cream Commonly known as: MONISTAT 7 Place 1 Applicatorful vaginally at bedtime. Apply for seven nights   nitroGLYCERIN 0.4 MG SL tablet Commonly known as: NITROSTAT Place 1 tablet (0.4 mg total) under the tongue every 5 (five) minutes as needed for chest pain.   omeprazole 20 MG capsule Commonly known as:  PRILOSEC Take 20 mg by mouth daily.   ondansetron 4 MG disintegrating tablet Commonly known as: Zofran ODT Take 1 tablet (4 mg total) by mouth every 8 (eight) hours as needed for nausea or vomiting.   pantoprazole 40 MG tablet Commonly known as: PROTONIX Take 1 tablet (40 mg total) by mouth daily.   promethazine 25 MG tablet Commonly known as: PHENERGAN Take 1 tablet (25 mg total) by mouth every 6 (six) hours as needed for nausea or vomiting.   sucralfate 1 GM/10ML suspension Commonly known as: CARAFATE Take 10 mLs (1 g total) by mouth 4 (four) times daily for 14 days.       Allergies:  Allergies  Allergen Reactions  . Tape Itching    Ok with adhesive tape, ok with paper tape per patient    Past Medical History, Surgical history, Social history, and Family History were reviewed and updated.  Review of Systems: All other 10 point review of systems is negative.   Physical Exam:  vitals were not taken for this visit.   Wt Readings from Last 3 Encounters:  03/06/19 123 lb (55.8 kg)  02/23/19 118 lb (53.5 kg)  02/04/19 123 lb (55.8 kg)    Ocular: Sclerae unicteric, pupils equal, round and reactive to light Ear-nose-throat: Oropharynx clear, dentition fair Lymphatic: No cervical or supraclavicular adenopathy Lungs no rales or rhonchi, good excursion bilaterally Heart regular rate and rhythm, no murmur appreciated Abd soft, nontender, positive bowel sounds, no liver or spleen tip palpated on exam, no fluid wave  MSK no focal spinal tenderness, no joint edema Neuro: non-focal, well-oriented, appropriate affect Breasts: Deferred   Lab Results  Component Value Date   WBC 5.2 04/30/2019   HGB 10.8 (L) 04/30/2019   HCT 34.0 (L) 04/30/2019   MCV 90.4 04/30/2019   PLT 241 04/30/2019   Lab Results  Component Value Date   FERRITIN 129.0 03/14/2019   IRON 59 03/14/2019   TIBC 185 (L) 02/05/2019   UIBC 99 (L) 02/05/2019   IRONPCTSAT 26.8 03/14/2019   Lab Results   Component Value Date   RETICCTPCT 1.5 04/30/2019   RBC 3.72 (L) 04/30/2019   No results found for: Nils Pyle [facility] Lab Results  Component Value Date   IGGSERUM 1,273 05/21/2015   IGMSERUM 40 05/21/2015   No results found for: Odetta Pink, SPEI   Chemistry      Component Value Date/Time   NA 138 03/14/2019 1555   NA 140 01/26/2017 1002   NA 140 09/24/2015 0953   K 5.2 (H) 03/14/2019 1555   K 3.5 01/26/2017 1002   K 4.0 09/24/2015 0953   CL 103 03/14/2019 1555   CL 106 01/26/2017 1002   CO2 31 03/14/2019 1555   CO2 26 01/26/2017 1002   CO2 23 09/24/2015 0953   BUN 11 03/14/2019 1555   BUN 6 (L) 01/26/2017  1002   BUN 8.6 09/24/2015 0953   CREATININE 0.97 03/14/2019 1555   CREATININE 0.76 01/15/2019 0941   CREATININE 1.1 01/26/2017 1002   CREATININE 1.0 09/24/2015 0953      Component Value Date/Time   CALCIUM 9.0 03/14/2019 1555   CALCIUM 8.9 01/26/2017 1002   CALCIUM 9.6 09/24/2015 0953   ALKPHOS 60 03/14/2019 1555   ALKPHOS 68 01/26/2017 1002   ALKPHOS 99 09/24/2015 0953   AST 12 03/14/2019 1555   AST 13 (L) 01/15/2019 0941   AST 17 09/24/2015 0953   ALT 10 03/14/2019 1555   ALT 9 01/15/2019 0941   ALT 17 01/26/2017 1002   ALT 11 09/24/2015 0953   BILITOT 0.3 03/14/2019 1555   BILITOT 0.7 01/15/2019 0941   BILITOT 0.83 09/24/2015 0953       Impression and Plan: Paula [name] is a pleasant53 yo [place] American female with history of stage II gastric cancer and partial gastrectomy followed by adjuvant chemo and radiation. So far her work-ups have been negative for recurrence.  The weight loss is concerning. We will have her restart the Marinol twice daily prior to her 2 largest meals. She will also start weight in once a week at home and keeping a log. She will let us know if the weight loss continues.  Iron studies are pending and we will replace if needed.  We will plan to see  her back in another 6 weeks for follow-up.  She will contact our office with any questions or concerns. We can certainly see her sooner if needed.   Laverna Peace, NP 3/9/20218:54 AM

## 2019-04-30 NOTE — Patient Instructions (Signed)
Implanted Port Insertion, Care After °This sheet gives you information about how to care for yourself after your procedure. Your health care provider may also give you more specific instructions. If you have problems or questions, contact your health care provider. °What can I expect after the procedure? °After the procedure, it is common to have: °· Discomfort at the port insertion site. °· Bruising on the skin over the port. This should improve over 3-4 days. °Follow these instructions at home: °Port care °· After your port is placed, you will get a manufacturer's information card. The card has information about your port. Keep this card with you at all times. °· Take care of the port as told by your health care provider. Ask your health care provider if you or a family member can get training for taking care of the port at home. A home health care nurse may also take care of the port. °· Make sure to remember what type of port you have. °Incision care ° °  ° °· Follow instructions from your health care provider about how to take care of your port insertion site. Make sure you: °? Wash your hands with soap and water before and after you change your bandage (dressing). If soap and water are not available, use hand sanitizer. °? Change your dressing as told by your health care provider. °? Leave stitches (sutures), skin glue, or adhesive strips in place. These skin closures may need to stay in place for 2 weeks or longer. If adhesive strip edges start to loosen and curl up, you may trim the loose edges. Do not remove adhesive strips completely unless your health care provider tells you to do that. °· Check your port insertion site every day for signs of infection. Check for: °? Redness, swelling, or pain. °? Fluid or blood. °? Warmth. °? Pus or a bad smell. °Activity °· Return to your normal activities as told by your health care provider. Ask your health care provider what activities are safe for you. °· Do not  lift anything that is heavier than 10 lb (4.5 kg), or the limit that you are told, until your health care provider says that it is safe. °General instructions °· Take over-the-counter and prescription medicines only as told by your health care provider. °· Do not take baths, swim, or use a hot tub until your health care provider approves. Ask your health care provider if you may take showers. You may only be allowed to take sponge baths. °· Do not drive for 24 hours if you were given a sedative during your procedure. °· Wear a medical alert bracelet in case of an emergency. This will tell any health care providers that you have a port. °· Keep all follow-up visits as told by your health care provider. This is important. °Contact a health care provider if: °· You cannot flush your port with saline as directed, or you cannot draw blood from the port. °· You have a fever or chills. °· You have redness, swelling, or pain around your port insertion site. °· You have fluid or blood coming from your port insertion site. °· Your port insertion site feels warm to the touch. °· You have pus or a bad smell coming from the port insertion site. °Get help right away if: °· You have chest pain or shortness of breath. °· You have bleeding from your port that you cannot control. °Summary °· Take care of the port as told by your health   care provider. Keep the manufacturer's information card with you at all times. °· Change your dressing as told by your health care provider. °· Contact a health care provider if you have a fever or chills or if you have redness, swelling, or pain around your port insertion site. °· Keep all follow-up visits as told by your health care provider. °This information is not intended to replace advice given to you by your health care provider. Make sure you discuss any questions you have with your health care provider. °Document Revised: 09/05/2017 Document Reviewed: 09/05/2017 °Elsevier Patient Education ©  2020 [employer] ° °

## 2019-04-30 NOTE — Telephone Encounter (Signed)
Spoke with pt, appointment rescheduled for 05/15/19. According to the patients chart she has anemia, patient made aware that can cause an increase in palpitations.

## 2019-04-30 NOTE — Telephone Encounter (Signed)
I spoke to the patient who called because the past 2 weeks she has been experiencing "palpitations and a feeling like her heart is going to jump out of her chest."  She denies any other symptoms of SOB or swelling, but does mention "soreness" in her chest at port site.    She has an appointment with Dr Stanford Breed in [facility] on 3/31, but would like to see him sooner, if possible.

## 2019-04-30 NOTE — Telephone Encounter (Signed)
Patient c/o Palpitations:  High priority if patient c/o lightheadedness, shortness of breath, or chest pain  1) How long have you had palpitations/irregular HR/ Afib? Are you having the symptoms now? Has been going on for the past 2 weeks, not having symptoms right now.   2) Are you currently experiencing lightheadedness, SOB or CP? No   3) Do you have a history of afib (atrial fibrillation) or irregular heart rhythm? Yes   4) Have you checked your BP or HR? (document readings if available): No   5) Are you experiencing any other symptoms? No   Paula [name] is calling stating she has been experiencing palpitations at night for the past 2 weeks. She states it last for about 10 minutes and it feels as if her heart is skipping. No other symptoms are present when this occurs. She has an appointment scheduled in regards to this with Dr. Stanford Breed on 05/22/19 in [place]. Please advise.

## 2019-04-30 NOTE — Telephone Encounter (Signed)
lpmtcb 3/9

## 2019-05-07 NOTE — Progress Notes (Signed)
HPI: FU chest pain.Patient seen Paula [name] 6/18 with chest pain and palpitations.Patient had a nuclear study in [facility] October 2013. Her ejection fraction was 74% and no ischemia. Monitor June 2018 showed sinus to sinus tachycardia with rare PVC and brief run of SVT. Echocardiogram June 2018 showed normal LV function and mild mitral regurgitation. Nuclear study November 2020 showed ejection fraction 53% with no ischemia or infarction.  Since last seen,  patient denies dyspnea or chest pain.  She is having intermittent palpitations that are worse compared to previous.  It is described as her heart racing.  She has occasional dizziness but no syncope.  Current Outpatient Medications  Medication Sig Dispense Refill  . carvedilol (COREG) 6.25 MG tablet Take 0.5 tablets (3.125 mg total) by mouth 2 (two) times daily. 180 tablet 3  . dronabinol (MARINOL) 5 MG capsule Take 1 capsule (5 mg total) by mouth 2 (two) times daily before lunch and supper. 60 capsule 0  . fluticasone (FLONASE) 50 MCG/ACT nasal spray Place 1 spray into both nostrils daily. 16 g 2  . HYDROcodone-acetaminophen (NORCO/VICODIN) 5-325 MG tablet Take 1 tablet by mouth every 6 (six) hours as needed for moderate pain. 60 tablet 0  . lidocaine-prilocaine (EMLA) cream Apply 1 application topically as needed. 30 g 6  . nitroGLYCERIN (NITROSTAT) 0.4 MG SL tablet Place 1 tablet (0.4 mg total) under the tongue every 5 (five) minutes as needed for chest pain. 25 tablet 2  . omeprazole (PRILOSEC) 20 MG capsule Take 20 mg by mouth daily.    . ondansetron (ZOFRAN ODT) 4 MG disintegrating tablet Take 1 tablet (4 mg total) by mouth every 8 (eight) hours as needed for nausea or vomiting. 30 tablet 0  . promethazine (PHENERGAN) 25 MG tablet Take 1 tablet (25 mg total) by mouth every 6 (six) hours as needed for nausea or vomiting. 30 tablet 1   No current facility-administered medications for this visit.   Facility-Administered  Medications Ordered in Other Visits  Medication Dose Route Frequency Provider Last Rate Last Admin  . sodium chloride flush (NS) 0.9 % injection 10 mL  10 mL Intravenous PRN Volanda Napoleon, MD   10 mL at 01/03/18 1316     Past Medical History:  Diagnosis Date  . Anxiety    per pt  . Arthritis    back  . Costochondritis   . Esophageal reflux    On omeprazole  . Family history of cancer   . Headache(784.0) 2011   Migraines  . Heart murmur    never caused any problems  . History of hiatal hernia   . History of radiation therapy 05/12/14- 06/17/14   post-op gastric region/ nodes/ 45 Gy in 25 fractions.   . Hypertension    started around 2011  . Iron deficiency anemia due to chronic blood loss 05/25/2015  . 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 On antineoplastic chemotherapy    5-FU and leucovorin  . Ovarian cyst   . Pneumonia    x 1  . Pulmonary emboli ([place]) 2015  . Stomach cancer ([place]) dx'd 08/2013  . Tuberculosis    ? [id number]  . Wears partial dentures    upper dentures    Past Surgical History:  Procedure Laterality Date  . ABDOMINAL HYSTERECTOMY    . CESAREAN SECTION     x 2  . ESOPHAGOGASTRODUODENOSCOPY N/A 09/16/2013   Procedure: ESOPHAGOGASTRODUODENOSCOPY (EGD);  Surgeon: Wonda Horner, MD;  Location: [facility];  Service: Endoscopy;  Laterality: N/A;  . GASTROSTOMY     r/t stomach cancer  . IR CV LINE INJECTION  08/30/2017  . IR FLUORO GUIDE PORT INSERTION RIGHT  03/21/2017  . IR US GUIDE VASC ACCESS RIGHT  03/21/2017  . LAPAROSCOPY N/A 09/20/2013   Procedure: DIAGNOSTIC LAPAROSCOPY,DISTAL GASTRECTOMY AND FEEDING GASTROJEJUNOSTOMY;  Surgeon: [name], MD;  Location: [facility];  Service: General;  Laterality: N/A;  . OOPHORECTOMY     Around 1985 left ovary removal  . PORTACATH PLACEMENT Left 11/18/2013   Procedure: INSERTION PORT-A-CATH;  Surgeon: [name], MD;  Location: [facility];  Service: General;  Laterality: Left;  . TONSILLECTOMY     Around 1994  . TUBAL  LIGATION    . UNILATERAL SALPINGECTOMY Left 11/09/2017   Procedure: UNILATERAL SALPINGECTOMY;  Surgeon: Osborne Oman, MD;  Location: [facility];  Service: Gynecology;  Laterality: Left;  . UPPER GASTROINTESTINAL ENDOSCOPY    . VAGINAL HYSTERECTOMY Left 11/09/2017   Procedure: HYSTERECTOMY VAGINAL WITH LEFT SALPINGECTOMY;  Surgeon: Osborne Oman, MD;  Location: [facility];  Service: Gynecology;  Laterality: Left;    Social History   Socioeconomic History  . Marital status: Legally Separated    Spouse name: Cornelia Copa  . Number of children: 2  . Years of education: Not on file  . Highest education level: Not on file  Occupational History    Employer: [employer]  Tobacco Use  . Smoking status: Former Smoker    Packs/day: 1.00    Years: 16.00    Pack years: 16.00    Types: Cigarettes    Quit date: 02/21/1998    Years since quitting: 21.2  . Smokeless tobacco: Never Used  . Tobacco comment: 0.5-1.0 ppd for 16 yrs  Substance and Sexual Activity  . Alcohol use: Yes    Alcohol/week: 0.0 standard drinks    Comment: occasional wine  . Drug use: Yes    Types: Marijuana    Comment: 03/04/2010  . Sexual activity: Not Currently    Birth control/protection: Surgical    Comment: Depo Inj 09/2017  Other Topics Concern  . Not on file  Social History Narrative  . Not on file   Social Determinants of Health   Financial Resource Strain:   . Difficulty of Paying Living Expenses:   Food Insecurity:   . Worried About Charity fundraiser in the Last Year:   . Arboriculturist in the Last Year:   Transportation Needs:   . Film/video editor (Medical):   Marland Kitchen Lack of Transportation (Non-Medical):   Physical Activity:   . Days of Exercise per Week:   . Minutes of Exercise per Session:   Stress:   . Feeling of Stress :   Social Connections:   . Frequency of Communication with Friends and Family:   . Frequency of Social Gatherings with Friends and Family:   . Attends Religious  Services:   . Active Member of Clubs or Organizations:   . Attends Archivist Meetings:   Marland Kitchen Marital Status:   Intimate Partner Violence:   . Fear of Current or Ex-Partner:   . Emotionally Abused:   Marland Kitchen Physically Abused:   . Sexually Abused:     Family History  Problem Relation Age of Onset  . Hypertension Mother   . Diabetes Mellitus II Mother   . Other Mother        hx of hysterectomy for unspecified reason  . Hypertension Sister   .  Hypertension Brother   . Heart failure Maternal Grandmother   . Heart Problems Maternal Grandmother   . Hypertension Maternal Grandfather   . Diabetes Maternal Grandfather   . Colon cancer Paternal Uncle        dx. late 50s-early 60s  . Gastric cancer Cousin 64  . Colon cancer Cousin        dx. early-late 64s; (father also had colon cancer)  . Colon cancer Paternal Uncle        dx. late 50s-early 60s  . Lung cancer Paternal Uncle        dx. 28s; smoker  . Colon polyps Paternal Uncle        unknown number  . Heart attack Maternal Aunt   . Heart Problems Paternal Aunt   . Heart attack Paternal Grandfather   . Colon cancer Paternal Grandfather        dx. 50s-60s  . Colon polyps Brother        1 maternal half-brother had approx 3 polyps  . Breast cancer Cousin 7  . Breast cancer Cousin        dx. early 84s or earlier (father had colon cancer)  . Lung cancer Other        PGF's brothers (x4); dx. later in life; lim info  . Esophageal cancer Neg Hx   . Rectal cancer Neg Hx     ROS: no fevers or chills, productive cough, hemoptysis, dysphasia, odynophagia, melena, hematochezia, dysuria, hematuria, rash, seizure activity, orthopnea, PND, pedal edema, claudication. Remaining systems are negative.  Physical Exam: Well-developed well-nourished in no acute distress.  Skin is warm and dry.  HEENT is normal.  Neck is supple.  Chest is clear to auscultation with normal expansion.  Cardiovascular exam is regular rate and rhythm.    Abdominal exam nontender or distended. No masses palpated. Extremities show no edema. neuro grossly intact  ECG-normal sinus rhythm at a rate of 68, left ventricular hypertrophy.  Personally reviewed  A/P  1 atypical chest pain-most recent nuclear study showed no ischemia. If she has worsening symptoms in the future we will consider CTA.  2 hypertension-blood pressure controlled. Continue present medications.  3 palpitations-continue carvedilol.  Her symptoms are worse.  We will repeat monitor.  4 mitral regurgitation-mild on most recent echocardiogram.  5 prior pulmonary embolus  Kirk Ruths, MD

## 2019-05-15 ENCOUNTER — Other Ambulatory Visit: Payer: Self-pay | Admitting: *Deleted

## 2019-05-15 ENCOUNTER — Other Ambulatory Visit: Payer: Self-pay

## 2019-05-15 ENCOUNTER — Ambulatory Visit (INDEPENDENT_AMBULATORY_CARE_PROVIDER_SITE_OTHER): Payer: Medicare Other | Admitting: Cardiology

## 2019-05-15 ENCOUNTER — Encounter: Payer: Self-pay | Admitting: *Deleted

## 2019-05-15 ENCOUNTER — Encounter: Payer: Self-pay | Admitting: Cardiology

## 2019-05-15 VITALS — BP 110/74 | HR 68 | Ht 61.0 in | Wt 110.4 lb

## 2019-05-15 DIAGNOSIS — R0789 Other chest pain: Secondary | ICD-10-CM | POA: Diagnosis not present

## 2019-05-15 DIAGNOSIS — C163 Malignant neoplasm of pyloric antrum: Secondary | ICD-10-CM

## 2019-05-15 DIAGNOSIS — R002 Palpitations: Secondary | ICD-10-CM

## 2019-05-15 DIAGNOSIS — I10 Essential (primary) hypertension: Secondary | ICD-10-CM | POA: Diagnosis not present

## 2019-05-15 DIAGNOSIS — C169 Malignant neoplasm of stomach, unspecified: Secondary | ICD-10-CM

## 2019-05-15 MED ORDER — HYDROCODONE-ACETAMINOPHEN 5-325 MG PO TABS: 1.0000 | ORAL_TABLET | Freq: Four times a day (QID) | ORAL | 0 refills | Status: DC | PRN

## 2019-05-15 NOTE — Progress Notes (Unsigned)
Patient ID: Paula [name], female   DOB: March 25, 1965, 54 y.o.   MRN: [id number] Patient enrolled for [employer] to ship a 30 day cardiac event monitor to her home.  Instructions sent to patient via My Chart message.

## 2019-05-15 NOTE — Patient Instructions (Signed)
Medication Instructions:  NO CHANGE *If you need a refill on your cardiac medications before your next appointment, please call your pharmacy*   Lab Work: If you have labs (blood work) drawn today and your tests are completely normal, you will receive your results only by: Marland Kitchen MyChart Message (if you have MyChart) OR . A paper copy in the mail If you have any lab test that is abnormal or we need to change your treatment, we will call you to review the results.   Testing/Procedures: Your physician has recommended that you wear a 30 DAY event monitor. Event monitors are medical devices that record the heart's electrical activity. Doctors most often [place] these monitors to diagnose arrhythmias. Arrhythmias are problems with the speed or rhythm of the heartbeat. The monitor is a small, portable device. You can wear one while you do your normal daily activities. This is usually used to diagnose what is causing palpitations/syncope (passing out).     Follow-Up: At [facility], you and your health needs are our priority.  As part of our continuing mission to provide you with exceptional heart care, we have created designated Provider Care Teams.  These Care Teams include your primary Cardiologist (physician) and Advanced Practice Providers (APPs -  Physician Assistants and Nurse Practitioners) who all work together to provide you with the care you need, when you need it.  We recommend signing up for the patient portal called "MyChart".  Sign up information is provided on this After Visit Summary.  MyChart is used to connect with patients for Virtual Visits (Telemedicine).  Patients are able to view lab/test results, encounter notes, upcoming appointments, etc.  Non-urgent messages can be sent to your provider as well.   To learn more about what you can do with MyChart, go to [email or web address].    Your next appointment:   6 month(s)  The format for your next appointment:   Either In Person or  Virtual  Provider:   Kirk Ruths, MD

## 2019-05-22 ENCOUNTER — Ambulatory Visit: Payer: Medicare Other | Admitting: Cardiology

## 2019-06-11 ENCOUNTER — Inpatient Hospital Stay: Payer: Medicare Other

## 2019-06-11 ENCOUNTER — Other Ambulatory Visit: Payer: Self-pay

## 2019-06-11 ENCOUNTER — Inpatient Hospital Stay: Payer: Medicare Other | Attending: Hematology & Oncology

## 2019-06-11 ENCOUNTER — Telehealth: Payer: Self-pay | Admitting: Family

## 2019-06-11 ENCOUNTER — Encounter: Payer: Self-pay | Admitting: Family

## 2019-06-11 ENCOUNTER — Inpatient Hospital Stay (HOSPITAL_BASED_OUTPATIENT_CLINIC_OR_DEPARTMENT_OTHER): Payer: Medicare Other | Admitting: Family

## 2019-06-11 VITALS — BP 114/69 | HR 80 | Temp 97.1°F | Resp 17 | Wt 109.0 lb

## 2019-06-11 VITALS — BP 114/69 | HR 80 | Temp 97.1°F | Resp 18 | Ht 61.0 in | Wt 109.0 lb

## 2019-06-11 DIAGNOSIS — R634 Abnormal weight loss: Secondary | ICD-10-CM | POA: Insufficient documentation

## 2019-06-11 DIAGNOSIS — K909 Intestinal malabsorption, unspecified: Secondary | ICD-10-CM

## 2019-06-11 DIAGNOSIS — C163 Malignant neoplasm of pyloric antrum: Secondary | ICD-10-CM

## 2019-06-11 DIAGNOSIS — Z923 Personal history of irradiation: Secondary | ICD-10-CM | POA: Diagnosis not present

## 2019-06-11 DIAGNOSIS — C169 Malignant neoplasm of stomach, unspecified: Secondary | ICD-10-CM | POA: Diagnosis not present

## 2019-06-11 DIAGNOSIS — M25551 Pain in right hip: Secondary | ICD-10-CM | POA: Insufficient documentation

## 2019-06-11 DIAGNOSIS — G8929 Other chronic pain: Secondary | ICD-10-CM | POA: Diagnosis not present

## 2019-06-11 DIAGNOSIS — D508 Other iron deficiency anemias: Secondary | ICD-10-CM

## 2019-06-11 DIAGNOSIS — Z9221 Personal history of antineoplastic chemotherapy: Secondary | ICD-10-CM | POA: Diagnosis not present

## 2019-06-11 DIAGNOSIS — K219 Gastro-esophageal reflux disease without esophagitis: Secondary | ICD-10-CM | POA: Diagnosis not present

## 2019-06-11 DIAGNOSIS — D500 Iron deficiency anemia secondary to blood loss (chronic): Secondary | ICD-10-CM

## 2019-06-11 DIAGNOSIS — Z85028 Personal history of other malignant neoplasm of stomach: Secondary | ICD-10-CM | POA: Diagnosis present

## 2019-06-11 LAB — CMP (CANCER CENTER ONLY)
ALT: 13 U/L (ref 0–44)
AST: 16 U/L (ref 15–41)
Albumin: 3.9 g/dL (ref 3.5–5.0)
Alkaline Phosphatase: 50 U/L (ref 38–126)
Anion gap: 5 (ref 5–15)
BUN: 13 mg/dL (ref 6–20)
CO2: 29 mmol/L (ref 22–32)
Calcium: 9 mg/dL (ref 8.9–10.3)
Chloride: 104 mmol/L (ref 98–111)
Creatinine: 0.79 mg/dL (ref 0.44–1.00)
GFR, Est AFR Am: >60 mL/min (ref >60)
GFR, Estimated: >60 mL/min (ref >60)
Glucose, Bld: 143 mg/dL — ABNORMAL HIGH (ref 70–99)
Potassium: 4.1 mmol/L (ref 3.5–5.1)
Sodium: 138 mmol/L (ref 135–145)
Total Bilirubin: 0.5 mg/dL (ref 0.3–1.2)
Total Protein: 6.4 g/dL — ABNORMAL LOW (ref 6.5–8.1)

## 2019-06-11 LAB — RETICULOCYTES
Immature Retic Fract: 10.9 % (ref 2.3–15.9)
RBC.: 3.68 MIL/uL — ABNORMAL LOW (ref 3.87–5.11)
Retic Count, Absolute: 48.2 10*3/uL (ref 19.0–186.0)
Retic Ct Pct: 1.3 % (ref 0.4–3.1)

## 2019-06-11 LAB — CBC WITH DIFFERENTIAL (CANCER CENTER ONLY)
Abs Immature Granulocytes: 0.01 10*3/uL (ref 0.00–0.07)
Basophils Absolute: 0 10*3/uL (ref 0.0–0.1)
Basophils Relative: 0 %
Eosinophils Absolute: 0.1 10*3/uL (ref 0.0–0.5)
Eosinophils Relative: 2 %
HCT: 33.9 % — ABNORMAL LOW (ref 36.0–46.0)
Hemoglobin: 10.7 g/dL — ABNORMAL LOW (ref 12.0–15.0)
Immature Granulocytes: 0 %
Lymphocytes Relative: 32 %
Lymphs Abs: 1.5 10*3/uL (ref 0.7–4.0)
MCH: 29.2 pg (ref 26.0–34.0)
MCHC: 31.6 g/dL (ref 30.0–36.0)
MCV: 92.6 fL (ref 80.0–100.0)
Monocytes Absolute: 0.4 10*3/uL (ref 0.1–1.0)
Monocytes Relative: 9 %
Neutro Abs: 2.7 10*3/uL (ref 1.7–7.7)
Neutrophils Relative %: 57 %
Platelet Count: 226 10*3/uL (ref 150–400)
RBC: 3.66 MIL/uL — ABNORMAL LOW (ref 3.87–5.11)
RDW: 13.6 % (ref 11.5–15.5)
WBC Count: 4.7 10*3/uL (ref 4.0–10.5)
nRBC: 0 % (ref 0.0–0.2)

## 2019-06-11 LAB — IRON AND TIBC
Iron: 60 ug/dL (ref 41–142)
Saturation Ratios: 28 % (ref 21–57)
TIBC: 216 ug/dL — ABNORMAL LOW (ref 236–444)
UIBC: 156 ug/dL (ref 120–384)

## 2019-06-11 LAB — FERRITIN: Ferritin: 146 ng/mL (ref 11–307)

## 2019-06-11 MED ORDER — SODIUM CHLORIDE 0.9% FLUSH
10.0000 mL | Freq: Once | INTRAVENOUS | Status: AC | PRN
  Administered 2019-06-11: 10 mL
  Filled 2019-06-11: qty 10

## 2019-06-11 MED ORDER — HEPARIN SOD (PORK) LOCK FLUSH 100 UNIT/ML IV SOLN
500.0000 [IU] | Freq: Once | INTRAVENOUS | Status: AC | PRN
  Administered 2019-06-11: 500 [IU]
  Filled 2019-06-11: qty 5

## 2019-06-11 NOTE — Patient Instructions (Signed)
Implanted Port Insertion, Care After °This sheet gives you information about how to care for yourself after your procedure. Your health care provider may also give you more specific instructions. If you have problems or questions, contact your health care provider. °What can I expect after the procedure? °After the procedure, it is common to have: °· Discomfort at the port insertion site. °· Bruising on the skin over the port. This should improve over 3-4 days. °Follow these instructions at home: °Port care °· After your port is placed, you will get a manufacturer's information card. The card has information about your port. Keep this card with you at all times. °· Take care of the port as told by your health care provider. Ask your health care provider if you or a family member can get training for taking care of the port at home. A home health care nurse may also take care of the port. °· Make sure to remember what type of port you have. °Incision care ° °  ° °· Follow instructions from your health care provider about how to take care of your port insertion site. Make sure you: °? Wash your hands with soap and water before and after you change your bandage (dressing). If soap and water are not available, use hand sanitizer. °? Change your dressing as told by your health care provider. °? Leave stitches (sutures), skin glue, or adhesive strips in place. These skin closures may need to stay in place for 2 weeks or longer. If adhesive strip edges start to loosen and curl up, you may trim the loose edges. Do not remove adhesive strips completely unless your health care provider tells you to do that. °· Check your port insertion site every day for signs of infection. Check for: °? Redness, swelling, or pain. °? Fluid or blood. °? Warmth. °? Pus or a bad smell. °Activity °· Return to your normal activities as told by your health care provider. Ask your health care provider what activities are safe for you. °· Do not  lift anything that is heavier than 10 lb (4.5 kg), or the limit that you are told, until your health care provider says that it is safe. °General instructions °· Take over-the-counter and prescription medicines only as told by your health care provider. °· Do not take baths, swim, or use a hot tub until your health care provider approves. Ask your health care provider if you may take showers. You may only be allowed to take sponge baths. °· Do not drive for 24 hours if you were given a sedative during your procedure. °· Wear a medical alert bracelet in case of an emergency. This will tell any health care providers that you have a port. °· Keep all follow-up visits as told by your health care provider. This is important. °Contact a health care provider if: °· You cannot flush your port with saline as directed, or you cannot draw blood from the port. °· You have a fever or chills. °· You have redness, swelling, or pain around your port insertion site. °· You have fluid or blood coming from your port insertion site. °· Your port insertion site feels warm to the touch. °· You have pus or a bad smell coming from the port insertion site. °Get help right away if: °· You have chest pain or shortness of breath. °· You have bleeding from your port that you cannot control. °Summary °· Take care of the port as told by your health   care provider. Keep the manufacturer's information card with you at all times. °· Change your dressing as told by your health care provider. °· Contact a health care provider if you have a fever or chills or if you have redness, swelling, or pain around your port insertion site. °· Keep all follow-up visits as told by your health care provider. °This information is not intended to replace advice given to you by your health care provider. Make sure you discuss any questions you have with your health care provider. °Document Revised: 09/05/2017 Document Reviewed: 09/05/2017 °Elsevier Patient Education ©  2020 [employer] ° °

## 2019-06-11 NOTE — Progress Notes (Signed)
Hematology and Oncology Follow Up Visit  Paula [name] [id number] 1965-04-23 54 y.o. 06/11/2019   Principle Diagnosis:  Stage IIA (T1a, N2, M0) gastric adenocarcinoma diagnosed in July of 2015 Intermittent iron deficiency anemia  Past Therapy: Status post diagnostic laparoscopy, distal gastrectomy with billroth II anastamosis and feeding gastrojejunostomy tube under the care of Dr. Barry Dienes on 09/20/2013 Adjuvant systemic chemotherapy with 5-FU 600 mg/M2 with leucovorin on days 1, 8, 15, 22, 29 and 36 every 8 weeks Adjuvant concurrent chemoradiation with Xeloda 650 mg/M2 by mouth twice a day for 5 days every week with radiation. The patient completed the course of radiotherapy but she declined to take Xeloda during her radiation  Current Therapy:        Observation IV iron as indicated   Interim History:  [name] is here today for follow-up. She is doing well but unfortunately is still losing weight. She is down 14 lbs since January.  She has had only 2 episodes of n/v since we last saw her. She does have history of GERD with Barrets esophagus treated with Carafate and Protonix. She now takes Prilosec as needed.  She is doing her best to eat well but has not been taking the Marinol regularly.  She has some fatigue at times and admits that she has trouble falling asleep.  No fever, chills, cough, rash, SOB, chest pain, abdominal pain or changes in bowel or bladder habits.  She has occasional episodes of dizziness and has stumbled a few times.  No syncopal episodes to report.  She feels that her palpitations have improved. She was sent a heart monitor by her cardiologist in the mail and plans to put it on today.  She is still seeing her therapist and feels that this has been very helpful for her.  No swelling, numbness or tingling in her extremities. She has chronic right hip pain and will be having a leukocyte rich PRP injection with Dr. Case.  No episodes of bleeding  noted. She bruises easily but not in excess. No petechiae.   ECOG Performance Status: 1 - Symptomatic but completely ambulatory  Medications:  Allergies as of 06/11/2019      Reactions   Tape Itching   Ok with adhesive and paper tape       Medication List       Accurate as of June 11, 2019  9:32 AM. If you have any questions, ask your nurse or doctor.        carvedilol 6.25 MG tablet Commonly known as: COREG Take 0.5 tablets (3.125 mg total) by mouth 2 (two) times daily.   dronabinol 5 MG capsule Commonly known as: MARINOL Take 1 capsule (5 mg total) by mouth 2 (two) times daily before lunch and supper.   fluticasone 50 MCG/ACT nasal spray Commonly known as: FLONASE Place 1 spray into both nostrils daily.   HYDROcodone-acetaminophen 5-325 MG tablet Commonly known as: NORCO/VICODIN Take 1 tablet by mouth every 6 (six) hours as needed for moderate pain.   lidocaine-prilocaine cream Commonly known as: EMLA Apply 1 application topically as needed.   nitroGLYCERIN 0.4 MG SL tablet Commonly known as: NITROSTAT Place 1 tablet (0.4 mg total) under the tongue every 5 (five) minutes as needed for chest pain.   omeprazole 20 MG capsule Commonly known as: PRILOSEC Take 20 mg by mouth daily.   ondansetron 4 MG disintegrating tablet Commonly known as: Zofran ODT Take 1 tablet (4 mg total) by mouth every 8 (eight) hours as needed  for nausea or vomiting.   promethazine 25 MG tablet Commonly known as: PHENERGAN Take 1 tablet (25 mg total) by mouth every 6 (six) hours as needed for nausea or vomiting.       Allergies:  Allergies  Allergen Reactions  . Tape Itching    Ok with adhesive and paper tape     Past Medical History, Surgical history, Social history, and Family History were reviewed and updated.  Review of Systems: All other 10 point review of systems is negative.   Physical Exam:  vitals were not taken for this visit.   Wt Readings from Last 3 Encounters:   06/11/19 109 lb 0.6 oz (49.5 kg)  05/15/19 110 lb 6.4 oz (50.1 kg)  04/30/19 108 lb (49 kg)    Ocular: Sclerae unicteric, pupils equal, round and reactive to light Ear-nose-throat: Oropharynx clear, dentition fair Lymphatic: No cervical or supraclavicular adenopathy Lungs no rales or rhonchi, good excursion bilaterally Heart regular rate and rhythm, no murmur appreciated Abd soft, nontender, positive bowel sounds, no liver or spleen tip palpated on exam, no fluid wave  MSK no focal spinal tenderness, no joint edema Neuro: non-focal, well-oriented, appropriate affect Breasts: Deferred   Lab Results  Component Value Date   WBC 5.2 04/30/2019   HGB 10.8 (L) 04/30/2019   HCT 34.0 (L) 04/30/2019   MCV 90.4 04/30/2019   PLT 241 04/30/2019   Lab Results  Component Value Date   FERRITIN 184 04/30/2019   IRON 112 04/30/2019   TIBC 219 (L) 04/30/2019   UIBC 107 (L) 04/30/2019   IRONPCTSAT 51 04/30/2019   Lab Results  Component Value Date   RETICCTPCT 1.5 04/30/2019   RBC 3.72 (L) 04/30/2019   No results found for: Nils Pyle [facility] Lab Results  Component Value Date   IGGSERUM 1,273 05/21/2015   IGMSERUM 40 05/21/2015   No results found for: Kathrynn Ducking, MSPIKE, SPEI   Chemistry      Component Value Date/Time   NA 139 04/30/2019 0844   NA 140 01/26/2017 1002   NA 140 09/24/2015 0953   K 3.9 04/30/2019 0844   K 3.5 01/26/2017 1002   K 4.0 09/24/2015 0953   CL 103 04/30/2019 0844   CL 106 01/26/2017 1002   CO2 30 04/30/2019 0844   CO2 26 01/26/2017 1002   CO2 23 09/24/2015 0953   BUN 13 04/30/2019 0844   BUN 6 (L) 01/26/2017 1002   BUN 8.6 09/24/2015 0953   CREATININE 0.77 04/30/2019 0844   CREATININE 1.1 01/26/2017 1002   CREATININE 1.0 09/24/2015 0953      Component Value Date/Time   CALCIUM 9.6 04/30/2019 0844   CALCIUM 8.9 01/26/2017 1002   CALCIUM 9.6 09/24/2015 0953   ALKPHOS 54  04/30/2019 0844   ALKPHOS 68 01/26/2017 1002   ALKPHOS 99 09/24/2015 0953   AST 12 (L) 04/30/2019 0844   AST 17 09/24/2015 0953   ALT 13 04/30/2019 0844   ALT 17 01/26/2017 1002   ALT 11 09/24/2015 0953   BILITOT 1.0 04/30/2019 0844   BILITOT 0.83 09/24/2015 0953       Impression and Plan: [name] is a pleasant53yo African American female with history of stage II gastric cancer and partial gastrectomy followed by adjuvant chemo and radiation. I have referred her to nutrition for weight loss.  Iron studies are pending and we will replace if needed.  We will plan to see her back in another 3 months.  She is in agreement with the plan and will contact our office with any questions or concerns. We can certainly see her sooner if needed.   Laverna Peace, NP 4/20/20219:32 AM

## 2019-06-11 NOTE — Telephone Encounter (Signed)
Called and LMVM for patient regarding appointments added per 4/20 los

## 2019-06-17 ENCOUNTER — Other Ambulatory Visit: Payer: Self-pay | Admitting: *Deleted

## 2019-06-17 DIAGNOSIS — C163 Malignant neoplasm of pyloric antrum: Secondary | ICD-10-CM

## 2019-06-17 DIAGNOSIS — C169 Malignant neoplasm of stomach, unspecified: Secondary | ICD-10-CM

## 2019-06-18 ENCOUNTER — Inpatient Hospital Stay: Payer: Medicare Other | Admitting: Nutrition

## 2019-06-18 ENCOUNTER — Other Ambulatory Visit: Payer: Self-pay

## 2019-06-18 MED ORDER — HYDROCODONE-ACETAMINOPHEN 5-325 MG PO TABS: 1.0000 | ORAL_TABLET | Freq: Four times a day (QID) | ORAL | 0 refills | Status: DC | PRN

## 2019-06-18 NOTE — Progress Notes (Signed)
54 year old female diagnosed with gastric cancer s/p distal gastrectomy in 2015. She had chemotherapy and radiation treatment.   PMH includes PE, HTN, Hiatal Hernia, Gastric Reflux, and Anxiety.  Medications include Marinol, Prilosec, Zofran and Phenergan.  Labs include Glucose 143.  Height: 5'1". Weight: 104 pounds. UBW: 123 pounds January 2021. BMI: 19.65.  Patient concerned with weight loss. States she cannot eat anything some days due to poor appetite. Reports vomiting with Ensure or Boost. States she vomited after taking Marinol.   Nutrition Diagnosis: Inadequate oral intake related to poor appetite and gastric cancer and associated treatments as evidenced by 15% weight loss over 3 months which is significant.  Intervention: Educated to eat small amounts of food every 2-3 hours. Encouraged high calorie, high protein foods. Recommended protein shake in 4 oz portions. Recommended vitamin mineral supplementation. Fact sheets provided and teach back method used. Contact information given.  Monitoring, Evaluation, Goals: Patient will increase calories and protein in small amounts to promote weight gain.  Next Visit: Tuesday, June 8.

## 2019-07-08 ENCOUNTER — Telehealth: Payer: Self-pay | Admitting: *Deleted

## 2019-07-08 NOTE — Telephone Encounter (Signed)
Monitor was delivered from [place] 05/21/2019.  [employer] tried to contact patient 4/1, 4/2, 4/5, 4/7, to initiate hookup.  [employer] cancelled study 06/13/2019.  Patient did start monitor 4/23. [employer] received 24 hours of data which was artifact.  Phone battery went dead 06-24-2022.  [employer] received monitor back from patient 07/04/2019.  Event monitor order cancelled.

## 2019-07-19 ENCOUNTER — Other Ambulatory Visit: Payer: Self-pay | Admitting: *Deleted

## 2019-07-19 DIAGNOSIS — C169 Malignant neoplasm of stomach, unspecified: Secondary | ICD-10-CM

## 2019-07-19 DIAGNOSIS — C163 Malignant neoplasm of pyloric antrum: Secondary | ICD-10-CM

## 2019-07-19 MED ORDER — HYDROCODONE-ACETAMINOPHEN 5-325 MG PO TABS: 1.0000 | ORAL_TABLET | Freq: Four times a day (QID) | ORAL | 0 refills | Status: DC | PRN

## 2019-07-30 ENCOUNTER — Inpatient Hospital Stay: Payer: Medicare Other | Attending: Hematology & Oncology | Admitting: Nutrition

## 2019-07-30 ENCOUNTER — Other Ambulatory Visit: Payer: Self-pay

## 2019-07-30 NOTE — Progress Notes (Signed)
Nutrition follow up completed with patient for weight loss follow up. Patient is s/p gastric cancer and distal gastrectomy in 2015. Patient weighed 107.4 lb today, increased from 104 pounds. Reports she has stopped or ran out of some of her vitamins and minerals. Reports peanut butter is causing reflux but she has stopped prilosec. She avoids fried foods. She complains of occasional nausea. She will sip on "smoothies" but does not tolerate Ensure. She agrees to try Boost. Complains of memory problems.  Nutrition Diagnosis: Inadequate oral intake improved.  Intervention: Enforced education on taking vitamin/mineral supplements and eating high calorie, high protein foods. Provided examples of snacks every 2 hours. Recommended resume vitamins and minerals and Prilosec. Referred to Primary Care MD for complaint of memory problems.  Monitoring, Evaluation, Goals: Patient will work to increase calories and protein and resume vitamin/mineral supplementation.  No follow up scheduled. Patient referred to Primary Care MD.

## 2019-08-19 ENCOUNTER — Other Ambulatory Visit: Payer: Self-pay | Admitting: *Deleted

## 2019-08-19 DIAGNOSIS — C163 Malignant neoplasm of pyloric antrum: Secondary | ICD-10-CM

## 2019-08-19 DIAGNOSIS — C169 Malignant neoplasm of stomach, unspecified: Secondary | ICD-10-CM

## 2019-08-19 MED ORDER — HYDROCODONE-ACETAMINOPHEN 5-325 MG PO TABS: 1.0000 | ORAL_TABLET | Freq: Four times a day (QID) | ORAL | 0 refills | Status: DC | PRN

## 2019-09-10 ENCOUNTER — Inpatient Hospital Stay (HOSPITAL_BASED_OUTPATIENT_CLINIC_OR_DEPARTMENT_OTHER): Payer: Medicare Other | Admitting: Family

## 2019-09-10 ENCOUNTER — Other Ambulatory Visit: Payer: Self-pay

## 2019-09-10 ENCOUNTER — Encounter: Payer: Self-pay | Admitting: Family

## 2019-09-10 ENCOUNTER — Inpatient Hospital Stay: Payer: Medicare Other | Attending: Hematology & Oncology

## 2019-09-10 ENCOUNTER — Telehealth: Payer: Self-pay | Admitting: Family

## 2019-09-10 ENCOUNTER — Other Ambulatory Visit: Payer: Self-pay | Admitting: *Deleted

## 2019-09-10 ENCOUNTER — Inpatient Hospital Stay: Payer: Medicare Other

## 2019-09-10 VITALS — BP 91/53 | HR 55 | Temp 97.9°F | Resp 17 | Wt 111.0 lb

## 2019-09-10 VITALS — BP 91/53 | HR 55 | Temp 97.9°F | Resp 17 | Ht 61.0 in | Wt 111.0 lb

## 2019-09-10 DIAGNOSIS — R634 Abnormal weight loss: Secondary | ICD-10-CM

## 2019-09-10 DIAGNOSIS — C169 Malignant neoplasm of stomach, unspecified: Secondary | ICD-10-CM | POA: Diagnosis not present

## 2019-09-10 DIAGNOSIS — D509 Iron deficiency anemia, unspecified: Secondary | ICD-10-CM | POA: Insufficient documentation

## 2019-09-10 DIAGNOSIS — Z85028 Personal history of other malignant neoplasm of stomach: Secondary | ICD-10-CM | POA: Diagnosis present

## 2019-09-10 DIAGNOSIS — Z95828 Presence of other vascular implants and grafts: Secondary | ICD-10-CM

## 2019-09-10 DIAGNOSIS — D508 Other iron deficiency anemias: Secondary | ICD-10-CM

## 2019-09-10 DIAGNOSIS — R112 Nausea with vomiting, unspecified: Secondary | ICD-10-CM

## 2019-09-10 DIAGNOSIS — T451X5A Adverse effect of antineoplastic and immunosuppressive drugs, initial encounter: Secondary | ICD-10-CM

## 2019-09-10 DIAGNOSIS — R110 Nausea: Secondary | ICD-10-CM

## 2019-09-10 DIAGNOSIS — D500 Iron deficiency anemia secondary to blood loss (chronic): Secondary | ICD-10-CM | POA: Diagnosis not present

## 2019-09-10 LAB — CBC WITH DIFFERENTIAL (CANCER CENTER ONLY)
Abs Immature Granulocytes: 0.01 10*3/uL (ref 0.00–0.07)
Basophils Absolute: 0 10*3/uL (ref 0.0–0.1)
Basophils Relative: 1 %
Eosinophils Absolute: 0.1 10*3/uL (ref 0.0–0.5)
Eosinophils Relative: 2 %
HCT: 31 % — ABNORMAL LOW (ref 36.0–46.0)
Hemoglobin: 9.6 g/dL — ABNORMAL LOW (ref 12.0–15.0)
Immature Granulocytes: 0 %
Lymphocytes Relative: 41 %
Lymphs Abs: 1.7 10*3/uL (ref 0.7–4.0)
MCH: 28.4 pg (ref 26.0–34.0)
MCHC: 31 g/dL (ref 30.0–36.0)
MCV: 91.7 fL (ref 80.0–100.0)
Monocytes Absolute: 0.5 10*3/uL (ref 0.1–1.0)
Monocytes Relative: 11 %
Neutro Abs: 1.9 10*3/uL (ref 1.7–7.7)
Neutrophils Relative %: 45 %
Platelet Count: 198 10*3/uL (ref 150–400)
RBC: 3.38 MIL/uL — ABNORMAL LOW (ref 3.87–5.11)
RDW: 13.3 % (ref 11.5–15.5)
WBC Count: 4.2 10*3/uL (ref 4.0–10.5)
nRBC: 0 % (ref 0.0–0.2)

## 2019-09-10 LAB — CMP (CANCER CENTER ONLY)
ALT: 13 U/L (ref 0–44)
AST: 16 U/L (ref 15–41)
Albumin: 3.9 g/dL (ref 3.5–5.0)
Alkaline Phosphatase: 47 U/L (ref 38–126)
Anion gap: 5 (ref 5–15)
BUN: 11 mg/dL (ref 6–20)
CO2: 28 mmol/L (ref 22–32)
Calcium: 8.9 mg/dL (ref 8.9–10.3)
Chloride: 104 mmol/L (ref 98–111)
Creatinine: 0.84 mg/dL (ref 0.44–1.00)
GFR, Est AFR Am: >60 mL/min (ref >60)
GFR, Estimated: >60 mL/min (ref >60)
Glucose, Bld: 129 mg/dL — ABNORMAL HIGH (ref 70–99)
Potassium: 3.9 mmol/L (ref 3.5–5.1)
Sodium: 137 mmol/L (ref 135–145)
Total Bilirubin: 0.4 mg/dL (ref 0.3–1.2)
Total Protein: 6 g/dL — ABNORMAL LOW (ref 6.5–8.1)

## 2019-09-10 LAB — RETICULOCYTES
Immature Retic Fract: 7.1 % (ref 2.3–15.9)
RBC.: 3.35 MIL/uL — ABNORMAL LOW (ref 3.87–5.11)
Retic Count, Absolute: 44.6 10*3/uL (ref 19.0–186.0)
Retic Ct Pct: 1.3 % (ref 0.4–3.1)

## 2019-09-10 MED ORDER — OMEPRAZOLE 20 MG PO CPDR: 20.0000 mg | DELAYED_RELEASE_CAPSULE | Freq: Every day | ORAL | 2 refills | Status: DC

## 2019-09-10 MED ORDER — HEPARIN SOD (PORK) LOCK FLUSH 100 UNIT/ML IV SOLN
500.0000 [IU] | Freq: Once | INTRAVENOUS | Status: AC
  Administered 2019-09-10: 500 [IU] via INTRAVENOUS
  Filled 2019-09-10: qty 5

## 2019-09-10 MED ORDER — SODIUM CHLORIDE 0.9% FLUSH
10.0000 mL | INTRAVENOUS | Status: AC | PRN
  Administered 2019-09-10: 10 mL via INTRAVENOUS
  Filled 2019-09-10: qty 10

## 2019-09-10 MED ORDER — PROMETHAZINE HCL 25 MG PO TABS: 25.0000 mg | ORAL_TABLET | Freq: Four times a day (QID) | ORAL | 2 refills | Status: DC | PRN

## 2019-09-10 MED FILL — PROMETHAZINE 25 MG TABLET: 25 | 7 days supply | Qty: 30 | Fill #0

## 2019-09-10 MED FILL — OMEPRAZOLE 20 MG CAP: 20 | 30 days supply | Qty: 30 | Fill #0

## 2019-09-10 NOTE — Patient Instructions (Signed)

## 2019-09-10 NOTE — Addendum Note (Signed)
Addended by: Rico Ala on: 09/10/2019 11:17 AM   Modules accepted: Orders, SmartSet

## 2019-09-10 NOTE — Progress Notes (Signed)
Hematology and Oncology Follow Up Visit  Paula [name] [id number] [birth year] 54 y.o. 09/10/2019   Principle Diagnosis:  Stage IIA (T1a, N2, M0) gastric adenocarcinoma diagnosed in July of 2015 Intermittent iron deficiency anemia  Past Therapy: Status post diagnostic laparoscopy, distal gastrectomy with billroth II anastamosis and feeding gastrojejunostomy tube under the care of Dr. Barry Dienes on 09/20/2013 Adjuvant systemic chemotherapy with 5-FU 600 mg/M2 with leucovorin on days 1, 8, 15, 22, 29 and 36 every 8 weeks Adjuvant concurrent chemoradiation with Xeloda 650 mg/M2 by mouth twice a day for 5 days every week with radiation. The patient completed the course of radiotherapy but she declined to take Xeloda during her radiation  Current Therapy: Observation IV iron as indicated   Interim History:  [name] is here today for follow-up. She had some n/v last weekend but this has since resolved. Her abdomen is still a little sore from the vomiting. No abnormality noted on exam.  Hgb is 9.6, MCV 91, platelets 198, WBC count 4.2.  She feels that her energy is better.  She has noted episodes of dizziness and states that her BP at the time was low. I encouraged her to follow-up with her PCP if this persists as well as letting them know about her issues with memory loss. She is alert and oriented x4 at this time.  No fever, chills, cough, rash, SOB, chest pain, palpitations, abdominal pain or changes in bowel or bladder habits.  No swelling, tenderness, numbness or tingling noted on exam.  No falls or syncopal episodes report.  She is eating well but admits that she needs to better hydrate throughout the day. Her weight has improved 7 lbs since her last visit.   ECOG Performance Status: 1 - Symptomatic but completely ambulatory  Medications:  Allergies as of 09/10/2019      Reactions   Tape Itching   Ok with adhesive and paper tape       Medication List         Accurate as of September 10, 2019 10:55 AM. If you have any questions, ask your nurse or doctor.        carvedilol 6.25 MG tablet Commonly known as: COREG Take 0.5 tablets (3.125 mg total) by mouth 2 (two) times daily.   dronabinol 5 MG capsule Commonly known as: MARINOL Take 1 capsule (5 mg total) by mouth 2 (two) times daily before lunch and supper.   fluticasone 50 MCG/ACT nasal spray Commonly known as: FLONASE Place 1 spray into both nostrils daily.   HYDROcodone-acetaminophen 5-325 MG tablet Commonly known as: NORCO/VICODIN Take 1 tablet by mouth every 6 (six) hours as needed for moderate pain.   lidocaine-prilocaine cream Commonly known as: EMLA Apply 1 application topically as needed.   nitroGLYCERIN 0.4 MG SL tablet Commonly known as: NITROSTAT Place 1 tablet (0.4 mg total) under the tongue every 5 (five) minutes as needed for chest pain.   omeprazole 20 MG capsule Commonly known as: PRILOSEC Take 20 mg by mouth daily.   ondansetron 4 MG disintegrating tablet Commonly known as: Zofran ODT Take 1 tablet (4 mg total) by mouth every 8 (eight) hours as needed for nausea or vomiting.   promethazine 25 MG tablet Commonly known as: PHENERGAN Take 1 tablet (25 mg total) by mouth every 6 (six) hours as needed for nausea or vomiting.   tiZANidine 2 MG tablet Commonly known as: ZANAFLEX Take 2 mg by mouth 3 (three) times daily.  Allergies:  Allergies  Allergen Reactions  . Tape Itching    Ok with adhesive and paper tape     Past Medical History, Surgical history, Social history, and Family History were reviewed and updated.  Review of Systems: All other 10 point review of systems is negative.   Physical Exam:  vitals were not taken for this visit.   Wt Readings from Last 3 Encounters:  09/10/19 111 lb (50.3 kg)  06/18/19 104 lb (47.2 kg)  06/11/19 109 lb 0.6 oz (49.5 kg)    Ocular: Sclerae unicteric, pupils equal, round and reactive to  light Ear-nose-throat: Oropharynx clear, dentition fair Lymphatic: No cervical or supraclavicular adenopathy Lungs no rales or rhonchi, good excursion bilaterally Heart regular rate and rhythm, no murmur appreciated Abd soft, nontender, positive bowel sounds, no liver or spleen tip palpated on exam, no fluid wave MSK no focal spinal tenderness, no joint edema Neuro: non-focal, well-oriented, appropriate affect Breasts: Deferred   Lab Results  Component Value Date   WBC 4.2 09/10/2019   HGB 9.6 (L) 09/10/2019   HCT 31.0 (L) 09/10/2019   MCV 91.7 09/10/2019   PLT 198 09/10/2019   Lab Results  Component Value Date   FERRITIN 146 06/11/2019   IRON 60 06/11/2019   TIBC 216 (L) 06/11/2019   UIBC 156 06/11/2019   IRONPCTSAT 28 06/11/2019   Lab Results  Component Value Date   RETICCTPCT 1.3 09/10/2019   RBC 3.38 (L) 09/10/2019   RBC 3.35 (L) 09/10/2019   No results found for: Nils Pyle [facility] Lab Results  Component Value Date   IGGSERUM 1,273 05/21/2015   IGMSERUM 40 05/21/2015   No results found for: Odetta Pink, SPEI   Chemistry      Component Value Date/Time   NA 138 06/11/2019 0939   NA 140 01/26/2017 1002   NA 140 09/24/2015 0953   K 4.1 06/11/2019 0939   K 3.5 01/26/2017 1002   K 4.0 09/24/2015 0953   CL 104 06/11/2019 0939   CL 106 01/26/2017 1002   CO2 29 06/11/2019 0939   CO2 26 01/26/2017 1002   CO2 23 09/24/2015 0953   BUN 13 06/11/2019 0939   BUN 6 (L) 01/26/2017 1002   BUN 8.6 09/24/2015 0953   CREATININE 0.79 06/11/2019 0939   CREATININE 1.1 01/26/2017 1002   CREATININE 1.0 09/24/2015 0953      Component Value Date/Time   CALCIUM 9.0 06/11/2019 0939   CALCIUM 8.9 01/26/2017 1002   CALCIUM 9.6 09/24/2015 0953   ALKPHOS 50 06/11/2019 0939   ALKPHOS 68 01/26/2017 1002   ALKPHOS 99 09/24/2015 0953   AST 16 06/11/2019 0939   AST 17 09/24/2015 0953   ALT 13 06/11/2019 0939    ALT 17 01/26/2017 1002   ALT 11 09/24/2015 0953   BILITOT 0.5 06/11/2019 0939   BILITOT 0.83 09/24/2015 0953       Impression and Plan: [name] is a pleasant53yo African American female with history of stage II gastric cancer and partial gastrectomy followed by adjuvant chemo and radiation.So far she has done well and there has been no evidence of recurrence.  Iron studies are pending. We can replace if needed.  We will plan to see her back in another 3 months, port flush every 6 weeks.  She can contact our office with any questions or concerns.   Laverna Peace, NP 7/20/202110:55 AM

## 2019-09-10 NOTE — Telephone Encounter (Signed)
Appointments scheduled calendar printed & mailed per 7/19 los

## 2019-09-11 LAB — IRON AND TIBC
Iron: 55 ug/dL (ref 41–142)
Saturation Ratios: 29 % (ref 21–57)
TIBC: 186 ug/dL — ABNORMAL LOW (ref 236–444)
UIBC: 132 ug/dL (ref 120–384)

## 2019-09-11 LAB — FERRITIN: Ferritin: 133 ng/mL (ref 11–307)

## 2019-09-13 ENCOUNTER — Telehealth: Payer: Self-pay | Admitting: *Deleted

## 2019-09-13 NOTE — Telephone Encounter (Signed)
Call received from patient stating that her vomiting started back on Wednesday and Thursday of this week, that she is having a difficult time keeping Phenergan down and would like a refill of Carafate. Pt denies any diarrhea or pain.  Jory Ee NP notified.  Call placed back to patient and patient notified to contact Dr. Steve Rattler office regarding issues with vomiting per order of S. Hurley NP.  Pt states that she will contact Dr. Steve Rattler office now.

## 2019-09-20 ENCOUNTER — Other Ambulatory Visit: Payer: Self-pay | Admitting: *Deleted

## 2019-09-20 DIAGNOSIS — C169 Malignant neoplasm of stomach, unspecified: Secondary | ICD-10-CM

## 2019-09-20 DIAGNOSIS — C163 Malignant neoplasm of pyloric antrum: Secondary | ICD-10-CM

## 2019-09-20 MED ORDER — HYDROCODONE-ACETAMINOPHEN 5-325 MG PO TABS: 1.0000 | ORAL_TABLET | Freq: Four times a day (QID) | ORAL | 0 refills | Status: DC | PRN

## 2019-09-27 ENCOUNTER — Other Ambulatory Visit: Payer: Self-pay | Admitting: Family

## 2019-09-27 ENCOUNTER — Encounter (HOSPITAL_BASED_OUTPATIENT_CLINIC_OR_DEPARTMENT_OTHER): Payer: Self-pay | Admitting: Emergency Medicine

## 2019-09-27 ENCOUNTER — Other Ambulatory Visit: Payer: Self-pay

## 2019-09-27 ENCOUNTER — Emergency Department (HOSPITAL_BASED_OUTPATIENT_CLINIC_OR_DEPARTMENT_OTHER)
Admission: EM | Admit: 2019-09-27 | Discharge: 2019-09-27 | Disposition: A | Discharge status: Home / Self Care | Payer: Medicare Other | Attending: Emergency Medicine | Admitting: Emergency Medicine

## 2019-09-27 DIAGNOSIS — R0789 Other chest pain: Secondary | ICD-10-CM | POA: Diagnosis not present

## 2019-09-27 DIAGNOSIS — Z87891 Personal history of nicotine dependence: Secondary | ICD-10-CM | POA: Insufficient documentation

## 2019-09-27 DIAGNOSIS — Z85028 Personal history of other malignant neoplasm of stomach: Secondary | ICD-10-CM | POA: Diagnosis not present

## 2019-09-27 DIAGNOSIS — R112 Nausea with vomiting, unspecified: Secondary | ICD-10-CM | POA: Insufficient documentation

## 2019-09-27 DIAGNOSIS — Z79899 Other long term (current) drug therapy: Secondary | ICD-10-CM | POA: Diagnosis not present

## 2019-09-27 DIAGNOSIS — R1010 Upper abdominal pain, unspecified: Secondary | ICD-10-CM | POA: Insufficient documentation

## 2019-09-27 DIAGNOSIS — Z90710 Acquired absence of both cervix and uterus: Secondary | ICD-10-CM | POA: Diagnosis not present

## 2019-09-27 DIAGNOSIS — I10 Essential (primary) hypertension: Secondary | ICD-10-CM | POA: Diagnosis not present

## 2019-09-27 LAB — COMPREHENSIVE METABOLIC PANEL
ALT: 17 U/L (ref 0–44)
AST: 28 U/L (ref 15–41)
Albumin: 3.3 g/dL — ABNORMAL LOW (ref 3.5–5.0)
Alkaline Phosphatase: 47 U/L (ref 38–126)
Anion gap: 7 (ref 5–15)
BUN: 10 mg/dL (ref 6–20)
CO2: 25 mmol/L (ref 22–32)
Calcium: 7.7 mg/dL — ABNORMAL LOW (ref 8.9–10.3)
Chloride: 107 mmol/L (ref 98–111)
Creatinine, Ser: 0.84 mg/dL (ref 0.44–1.00)
GFR calc Af Amer: >60 mL/min (ref >60)
GFR calc non Af Amer: >60 mL/min (ref >60)
Glucose, Bld: 91 mg/dL (ref 70–99)
Potassium: 4 mmol/L (ref 3.5–5.1)
Sodium: 139 mmol/L (ref 135–145)
Total Bilirubin: 0.2 mg/dL — ABNORMAL LOW (ref 0.3–1.2)
Total Protein: 5.8 g/dL — ABNORMAL LOW (ref 6.5–8.1)

## 2019-09-27 LAB — CBC WITH DIFFERENTIAL/PLATELET
Abs Immature Granulocytes: 0.01 10*3/uL (ref 0.00–0.07)
Basophils Absolute: 0 10*3/uL (ref 0.0–0.1)
Basophils Relative: 1 %
Eosinophils Absolute: 0.1 10*3/uL (ref 0.0–0.5)
Eosinophils Relative: 2 %
HCT: 32 % — ABNORMAL LOW (ref 36.0–46.0)
Hemoglobin: 10.2 g/dL — ABNORMAL LOW (ref 12.0–15.0)
Immature Granulocytes: 0 %
Lymphocytes Relative: 50 %
Lymphs Abs: 2 10*3/uL (ref 0.7–4.0)
MCH: 29.1 pg (ref 26.0–34.0)
MCHC: 31.9 g/dL (ref 30.0–36.0)
MCV: 91.4 fL (ref 80.0–100.0)
Monocytes Absolute: 0.5 10*3/uL (ref 0.1–1.0)
Monocytes Relative: 11 %
Neutro Abs: 1.5 10*3/uL — ABNORMAL LOW (ref 1.7–7.7)
Neutrophils Relative %: 36 %
Platelets: 172 10*3/uL (ref 150–400)
RBC: 3.5 MIL/uL — ABNORMAL LOW (ref 3.87–5.11)
RDW: 13.3 % (ref 11.5–15.5)
WBC: 4.1 10*3/uL (ref 4.0–10.5)
nRBC: 0 % (ref 0.0–0.2)

## 2019-09-27 LAB — TROPONIN I (HIGH SENSITIVITY): Troponin I (High Sensitivity): 3 ng/L (ref <18)

## 2019-09-27 LAB — LIPASE, BLOOD: Lipase: 18 U/L (ref 11–51)

## 2019-09-27 MED ORDER — SODIUM CHLORIDE 0.9 % IV BOLUS
1000.0000 mL | Freq: Once | INTRAVENOUS | Status: AC
  Administered 2019-09-27: 1000 mL via INTRAVENOUS

## 2019-09-27 MED ORDER — ONDANSETRON HCL 4 MG/2ML IJ SOLN
4.0000 mg | Freq: Once | INTRAMUSCULAR | Status: AC
  Administered 2019-09-27: 4 mg via INTRAVENOUS
  Filled 2019-09-27: qty 2

## 2019-09-27 MED ORDER — FENTANYL CITRATE (PF) 100 MCG/2ML IJ SOLN
50.0000 ug | Freq: Once | INTRAMUSCULAR | Status: AC
  Administered 2019-09-27: 50 ug via INTRAVENOUS
  Filled 2019-09-27: qty 2

## 2019-09-27 MED ORDER — HEPARIN SOD (PORK) LOCK FLUSH 100 UNIT/ML IV SOLN
INTRAVENOUS | Status: AC
  Administered 2019-09-27: 500 [IU]
  Filled 2019-09-27: qty 5

## 2019-09-27 NOTE — ED Provider Notes (Signed)
[facility] EMERGENCY DEPARTMENT Provider Note   CSN: [id number] Arrival date & time: 09/27/19  1745     History Chief Complaint  Patient presents with  . Emesis    Paula [name] is a 54 y.o. female.  Patient with history of stomach cancer, chronic narcotic use, anemia --presents to the emergency department with complaint of lower chest and upper abdominal pain as well as nausea and vomiting has been persistent over the past week.  Patient states that she typically will vomit infrequently.  It has been more persistent over the past week, especially in the evenings.  She feels that she is becoming dehydrated.  No fevers, cough or shortness of breath.  She has a bowel movement every other day which is normal.  No urinary symptoms.  She has a port in her right chest.  Reports weight loss over the past several months for which she has been worked up by her doctors including GI.  Upper endo 1/21 with mild gastritis, neg recurrence of gastric CA.         Past Medical History:  Diagnosis Date  . Anxiety    per pt  . Arthritis    back  . Costochondritis   . Esophageal reflux    On omeprazole  . Family history of cancer   . Headache(784.0) 2011   Migraines  . Heart murmur    never caused any problems  . History of hiatal hernia   . History of radiation therapy 05/12/14- 06/17/14   post-op gastric region/ nodes/ 45 Gy in 25 fractions.   . Hypertension    started around 2011  . Iron deficiency anemia due to chronic blood loss 05/25/2015  . 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 On antineoplastic chemotherapy    5-FU and leucovorin  . Ovarian cyst   . Pneumonia    x 1  . Pulmonary emboli ([place]) 2015  . Stomach cancer ([place]) dx'd 08/2013  . Tuberculosis    ? [id number]  . Wears partial dentures    upper dentures    Patient Active Problem List   Diagnosis Date Noted  . Right leg pain 10/15/2018  . Concussion with no loss of consciousness 09/10/2018  . Muscle pain  09/10/2018  . S/P vaginal hysterectomy 11/09/2017  . Left knee pain 04/25/2017  . Iron deficiency anemia due to chronic blood loss 05/25/2015  . Healed or old pulmonary embolism 03/31/2015  . Genetic testing 01/23/2015  . Insomnia 06/30/2014  . Neutropenia ([place]) 02/24/2014  . 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 Hypoalbuminemia 12/18/2013  . Malfunction of device 12/18/2013  . Leg edema 12/18/2013  . Neoplasm related pain 12/18/2013  . Other pancytopenia ([place]) 12/13/2013  . Hyponatremia 12/12/2013  . Hypokalemia 12/12/2013  . Weight loss 12/04/2013  . Abnormal loss of weight 12/04/2013  . Family history of cancer   . Cancer of antrum of stomach ([place]) 11/07/2013  . 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 Palliative care encounter 11/04/2013  . Acute kidney injury ([place]) 11/03/2013  . Chronic low back pain 10/31/2013  . 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 Constipation, chronic 10/28/2013  . Protein-calorie malnutrition, severe ([place]) 10/23/2013  . Intractable nausea and vomiting 10/22/2013  . Intractable vomiting 10/22/2013  . 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 Carcinoma of stomach ([place]) 09/18/2013  . Antral ulcer 09/16/2013  . Migraine headache 09/14/2013  . PTSD (post-traumatic stress disorder)  08/19/2013  . Left knee injury 07/30/2013  . Mitral regurgitation 02/15/2013  . Chest pain 01/23/2012  . Essential hypertension 01/23/2012  . Murmur 01/23/2012    Past Surgical History:  Procedure Laterality Date  . ABDOMINAL HYSTERECTOMY    . CESAREAN SECTION     x 2  . ESOPHAGOGASTRODUODENOSCOPY N/A 09/16/2013   Procedure: ESOPHAGOGASTRODUODENOSCOPY (EGD);  Surgeon: Wonda Horner, MD;  Location: [facility];  Service: Endoscopy;  Laterality: N/A;  . GASTROSTOMY     r/t stomach cancer  . IR CV LINE INJECTION  08/30/2017  . IR FLUORO GUIDE PORT INSERTION RIGHT   03/21/2017  . IR US GUIDE VASC ACCESS RIGHT  03/21/2017  . LAPAROSCOPY N/A 09/20/2013   Procedure: DIAGNOSTIC LAPAROSCOPY,DISTAL GASTRECTOMY AND FEEDING GASTROJEJUNOSTOMY;  Surgeon: [name], MD;  Location: [facility];  Service: General;  Laterality: N/A;  . OOPHORECTOMY     Around 1985 left ovary removal  . PORTACATH PLACEMENT Left 11/18/2013   Procedure: INSERTION PORT-A-CATH;  Surgeon: [name], MD;  Location: [facility];  Service: General;  Laterality: Left;  . TONSILLECTOMY     Around 1994  . TUBAL LIGATION    . UNILATERAL SALPINGECTOMY Left 11/09/2017   Procedure: UNILATERAL SALPINGECTOMY;  Surgeon: Osborne Oman, MD;  Location: [facility];  Service: Gynecology;  Laterality: Left;  . UPPER GASTROINTESTINAL ENDOSCOPY    . VAGINAL HYSTERECTOMY Left 11/09/2017   Procedure: HYSTERECTOMY VAGINAL WITH LEFT SALPINGECTOMY;  Surgeon: Osborne Oman, MD;  Location: [facility];  Service: Gynecology;  Laterality: Left;     OB History    Gravida  2   Para  2   Term  1   Preterm  1   AB      Living  2     SAB      TAB      Ectopic      Multiple      Live Births  2           Family History  Problem Relation Age of Onset  . Hypertension Mother   . Diabetes Mellitus II Mother   . Other Mother        hx of hysterectomy for unspecified reason  . Hypertension Sister   . Hypertension Brother   . Heart failure Maternal Grandmother   . Heart Problems Maternal Grandmother   . Hypertension Maternal Grandfather   . Diabetes Maternal Grandfather   . Colon cancer Paternal Uncle        dx. late 50s-early 60s  . Gastric cancer Cousin 56  . Colon cancer Cousin        dx. early-late 48s; (father also had colon cancer)  . Colon cancer Paternal Uncle        dx. late 50s-early 60s  . Lung cancer Paternal Uncle        dx. 51s; smoker  . Colon polyps Paternal Uncle        unknown number  . Heart attack Maternal Aunt   . Heart Problems Paternal Aunt   . Heart attack Paternal  Grandfather   . Colon cancer Paternal Grandfather        dx. 50s-60s  . Colon polyps Brother        1 maternal half-brother had approx 3 polyps  . Breast cancer Cousin 80  . Breast cancer Cousin        dx. early 48s or earlier (father had colon cancer)  . Lung cancer Other  PGF's brothers (x4); dx. later in life; lim info  . Esophageal cancer Neg Hx   . Rectal cancer Neg Hx     Social History   Tobacco Use  . Smoking status: Former Smoker    Packs/day: 1.00    Years: 16.00    Pack years: 16.00    Types: Cigarettes    Quit date: 02/21/1998    Years since quitting: 21.6  . Smokeless tobacco: Never Used  . Tobacco comment: 0.5-1.0 ppd for 16 yrs  Vaping Use  . Vaping Use: Never used  Substance Use Topics  . Alcohol use: Yes    Alcohol/week: 0.0 standard drinks    Comment: occasional wine  . Drug use: Yes    Types: Marijuana    Comment: 03/04/2010    Home Medications Prior to Admission medications   Medication Sig Start Date End Date Taking? Authorizing Provider  carvedilol (COREG) 6.25 MG tablet Take 0.5 tablets (3.125 mg total) by mouth 2 (two) times daily. 01/28/19   Lelon Perla, MD  dronabinol (MARINOL) 5 MG capsule Take 1 capsule (5 mg total) by mouth 2 (two) times daily before lunch and supper. 04/30/19   [place], Holli Humbles, NP  fluticasone (FLONASE) 50 MCG/ACT nasal spray Place 1 spray into both nostrils daily. 12/10/18   Khatri, Hina, PA-C  HYDROcodone-acetaminophen (NORCO/VICODIN) 5-325 MG tablet Take 1 tablet by mouth every 6 (six) hours as needed for moderate pain. 09/20/19   Volanda Napoleon, MD  lidocaine-prilocaine (EMLA) cream Apply 1 application topically as needed. 02/05/19   Volanda Napoleon, MD  nitroGLYCERIN (NITROSTAT) 0.4 MG SL tablet Place 1 tablet (0.4 mg total) under the tongue every 5 (five) minutes as needed for chest pain. Patient not taking: Reported on 09/10/2019 12/20/18 05/15/19  Lendon Colonel, NP  omeprazole (PRILOSEC) 20 MG  capsule Take 1 capsule (20 mg total) by mouth daily. 09/10/19   Volanda Napoleon, MD  ondansetron (ZOFRAN ODT) 4 MG disintegrating tablet Take 1 tablet (4 mg total) by mouth every 8 (eight) hours as needed for nausea or vomiting. 03/14/19   Jackquline Denmark, MD  promethazine (PHENERGAN) 25 MG tablet Take 1 tablet (25 mg total) by mouth every 6 (six) hours as needed for nausea or vomiting. 09/10/19   [place], Holli Humbles, NP  tiZANidine (ZANAFLEX) 2 MG tablet Take 2 mg by mouth 3 (three) times daily. 05/21/19   [provider]    Allergies    Tape  Review of Systems   Review of Systems  Constitutional: Negative for fever.  HENT: Negative for rhinorrhea and sore throat.   Eyes: Negative for redness.  Respiratory: Negative for cough.   Cardiovascular: Positive for chest pain.  Gastrointestinal: Positive for abdominal pain, nausea and vomiting. Negative for constipation and diarrhea.  Genitourinary: Negative for dysuria, frequency, hematuria and urgency.  Musculoskeletal: Negative for myalgias.  Skin: Negative for rash.  Neurological: Negative for headaches.    Physical Exam Updated Vital Signs BP 112/79 (BP Location: Left Arm)   Pulse 68   Temp 98.3 F (36.8 C) (Oral)   Resp 18   Ht 5\' 2"  (1.575 m)   Wt 49 kg   LMP 02/28/2016 (Exact Date)   SpO2 100%   BMI 19.75 kg/m   Physical Exam Vitals and nursing note reviewed.  Constitutional:      General: She is not in acute distress.    Appearance: She is well-developed.     Comments: Pt is thin.   HENT:  Head: Normocephalic and atraumatic.     Right Ear: External ear normal.     Left Ear: External ear normal.     Nose: Nose normal.     Mouth/Throat:     Mouth: Mucous membranes are dry.  Eyes:     Conjunctiva/sclera: Conjunctivae normal.  Cardiovascular:     Rate and Rhythm: Normal rate and regular rhythm.     Heart sounds: No murmur heard.   Pulmonary:     Effort: No respiratory distress.     Breath sounds: No  wheezing, rhonchi or rales.  Abdominal:     Palpations: Abdomen is soft.     Tenderness: There is abdominal tenderness (minimal epigastric). There is no guarding or rebound.  Musculoskeletal:     Cervical back: Normal range of motion and neck supple.     Right lower leg: No edema.     Left lower leg: No edema.  Skin:    General: Skin is warm and dry.     Findings: No rash.  Neurological:     General: No focal deficit present.     Mental Status: She is alert. Mental status is at baseline.     Motor: No weakness.  Psychiatric:        Mood and Affect: Mood normal.     ED Results / Procedures / Treatments   Labs (all labs ordered are listed, but only abnormal results are displayed) Labs Reviewed  CBC WITH DIFFERENTIAL/PLATELET - Abnormal; Notable for the following components:      Result Value   RBC 3.50 (*)    Hemoglobin 10.2 (*)    HCT 32.0 (*)    Neutro Abs 1.5 (*)    All other components within normal limits  COMPREHENSIVE METABOLIC PANEL - Abnormal; Notable for the following components:   Calcium 7.7 (*)    Total Protein 5.8 (*)    Albumin 3.3 (*)    Total Bilirubin 0.2 (*)    All other components within normal limits  LIPASE, BLOOD  TROPONIN I (HIGH SENSITIVITY)    EKG EKG Interpretation  Date/Time:  Friday September 27 2019 17:57:00 EDT Ventricular Rate:  65 PR Interval:  122 QRS Duration: 84 QT Interval:  386 QTC Calculation: 401 R Axis:   62 Text Interpretation: Normal sinus rhythm Minimal voltage criteria for LVH, may be normal variant ( Sokolow-Lyon ) No significant change since last tracing Confirmed by Blanchie Dessert [phone]) on 09/27/2019 7:00:31 PM   Radiology No results found.  Procedures Procedures (including critical care time)  Medications Ordered in ED Medications  sodium chloride 0.9 % bolus 1,000 mL (1,000 mLs Intravenous New Bag/Given 09/27/19 2108)  fentaNYL (SUBLIMAZE) injection 50 mcg (50 mcg Intravenous Given 09/27/19 2109)  ondansetron  (ZOFRAN) injection 4 mg (4 mg Intravenous Given 09/27/19 2109)    ED Course  I have reviewed the triage vital signs and the nursing notes.  Pertinent labs & imaging results that were available during my care of the patient were reviewed by me and considered in my medical decision making (see chart for details).  Patient seen and examined. Work-up initiated. Medications ordered.   Vital signs reviewed and are as follows: BP 112/79 (BP Location: Left Arm)   Pulse 68   Temp 98.3 F (36.8 C) (Oral)   Resp 18   Ht 5\' 2"  (1.575 m)   Wt 49 kg   LMP 02/28/2016 (Exact Date)   SpO2 100%   BMI 19.75 kg/m   10:57 PM lab  work shows chronic anemia.  Otherwise normal kidney function.  On recheck, she has not been vomiting.  She is feeling better after IV fluids.  She is requesting that port access to be discontinued so she can go home.  She has Phenergan that she uses at home and also Zofran.  She prefers to use Zofran as it makes her less drowsy.  I feel comfortable with discharged home at this time.  We discussed that she will need to follow-up with her primary care for further evaluation.  She states that she has heme-onc appointment in September.  The patient was urged to return to the Emergency Department immediately with worsening of current symptoms, worsening abdominal pain, persistent vomiting, blood noted in stools, fever, or any other concerns. The patient verbalized understanding.     MDM Rules/Calculators/A&P                          Patient with lower chest and upper abdominal pain in setting of more frequent N/V. Vitals are stable, no fever. Labs with chronic anemia, slightly low albumin. Imaging not felt indicated. She is subjectively improved with IV fluids.  No signs of dehydration, patient is tolerating PO's. Lungs are clear and no signs suggestive of PNA. Low concern for appendicitis, cholecystitis, pancreatitis, ruptured viscus, UTI, kidney stone, aortic dissection, aortic  aneurysm or other emergent abdominal etiology. Supportive therapy indicated with return if symptoms worsen.    Final Clinical Impression(s) / ED Diagnoses Final diagnoses:  Non-intractable vomiting with nausea, unspecified vomiting type    Rx / DC Orders ED Discharge Orders    None       Carlisle Cater, PA-C 09/27/19 [place], MD 09/27/19 2342

## 2019-09-27 NOTE — Discharge Instructions (Signed)
Please read and follow all provided instructions.  Your diagnoses today include:  1. Non-intractable vomiting with nausea, unspecified vomiting type     Tests performed today include:  Blood counts and electrolytes - mild chronic anemia  Blood tests to check liver and kidney function  Blood tests to check pancreas function  Blood test for heart irritation - normal  Vital signs. See below for your results today.   Medications prescribed:   None  Take any prescribed medications only as directed.  Home care instructions:   Follow any educational materials contained in this packet.    Drink clear liquids for the next 24 hours and introduce solid foods slowly after 24 hours using the b.r.a.t. diet (Bananas, Rice, Applesauce, Toast, Yogurt).    Follow-up instructions: Please follow-up with your primary care provider in the next 3 days for further evaluation of your symptoms.   Return instructions:  SEEK IMMEDIATE MEDICAL ATTENTION IF:  If you have pain that does not go away or becomes severe   A temperature above 101F develops   Repeated vomiting occurs (multiple episodes)   If you have pain that becomes localized to portions of the abdomen. The right side could possibly be appendicitis. In an adult, the left lower portion of the abdomen could be colitis or diverticulitis.   Blood is being passed in stools or vomit (bright red or black tarry stools)   You develop chest pain, difficulty breathing, dizziness or fainting, or become confused, poorly responsive, or inconsolable (young children)  If you have any other emergent concerns regarding your health  Additional Information: Abdominal (belly) pain can be caused by many things. Your caregiver performed an examination and possibly ordered blood/urine tests and imaging (CT scan, x-rays, ultrasound). Many cases can be observed and treated at home after initial evaluation in the emergency department. Even though you are being  discharged home, abdominal pain can be unpredictable. Therefore, you need a repeated exam if your pain does not resolve, returns, or worsens. Most patients with abdominal pain don't have to be admitted to the hospital or have surgery, but serious problems like appendicitis and gallbladder attacks can start out as nonspecific pain. Many abdominal conditions cannot be diagnosed in one visit, so follow-up evaluations are very important.  Your vital signs today were: BP 112/77   Pulse 75   Temp 98.3 F (36.8 C) (Oral)   Resp 20   Ht 5\' 2"  (1.575 m)   Wt 49 kg   LMP 02/28/2016 (Exact Date)   SpO2 95%   BMI 19.75 kg/m  If your blood pressure (bp) was elevated above 135/85 this visit, please have this repeated by your doctor within one month. --------------

## 2019-09-27 NOTE — ED Triage Notes (Signed)
Reports n/v since last Friday.  Was seen at her family doctor.  Was unable to get blood work.  Also c/o chest tightness for the last three days.  Endorses being sore from vomiting as well.  Denies any vomiting today just nauseated.

## 2019-10-02 MED FILL — TRIAMCINOLONE 0.1% OINTMENT: 0.1 | 21 days supply | Qty: 80 | Fill #0

## 2019-10-17 ENCOUNTER — Telehealth: Payer: Self-pay | Admitting: Cardiology

## 2019-10-17 ENCOUNTER — Emergency Department (HOSPITAL_BASED_OUTPATIENT_CLINIC_OR_DEPARTMENT_OTHER): Payer: Medicare Other

## 2019-10-17 ENCOUNTER — Encounter (HOSPITAL_BASED_OUTPATIENT_CLINIC_OR_DEPARTMENT_OTHER): Payer: Self-pay | Admitting: Emergency Medicine

## 2019-10-17 ENCOUNTER — Other Ambulatory Visit: Payer: Self-pay

## 2019-10-17 ENCOUNTER — Emergency Department (HOSPITAL_BASED_OUTPATIENT_CLINIC_OR_DEPARTMENT_OTHER)
Admission: EM | Admit: 2019-10-17 | Discharge: 2019-10-17 | Disposition: A | Discharge status: Home / Self Care | Payer: Medicare Other | Attending: Emergency Medicine | Admitting: Emergency Medicine

## 2019-10-17 DIAGNOSIS — R0789 Other chest pain: Secondary | ICD-10-CM | POA: Diagnosis not present

## 2019-10-17 DIAGNOSIS — C163 Malignant neoplasm of pyloric antrum: Secondary | ICD-10-CM | POA: Insufficient documentation

## 2019-10-17 DIAGNOSIS — I10 Essential (primary) hypertension: Secondary | ICD-10-CM | POA: Insufficient documentation

## 2019-10-17 DIAGNOSIS — C169 Malignant neoplasm of stomach, unspecified: Secondary | ICD-10-CM | POA: Diagnosis not present

## 2019-10-17 DIAGNOSIS — R0602 Shortness of breath: Secondary | ICD-10-CM | POA: Diagnosis not present

## 2019-10-17 DIAGNOSIS — R079 Chest pain, unspecified: Secondary | ICD-10-CM | POA: Diagnosis present

## 2019-10-17 DIAGNOSIS — Z87891 Personal history of nicotine dependence: Secondary | ICD-10-CM | POA: Diagnosis not present

## 2019-10-17 LAB — COMPREHENSIVE METABOLIC PANEL
ALT: 22 U/L (ref 0–44)
AST: 24 U/L (ref 15–41)
Albumin: 3.7 g/dL (ref 3.5–5.0)
Alkaline Phosphatase: 52 U/L (ref 38–126)
Anion gap: 5 (ref 5–15)
BUN: 12 mg/dL (ref 6–20)
CO2: 29 mmol/L (ref 22–32)
Calcium: 8.8 mg/dL — ABNORMAL LOW (ref 8.9–10.3)
Chloride: 102 mmol/L (ref 98–111)
Creatinine, Ser: 0.87 mg/dL (ref 0.44–1.00)
GFR calc Af Amer: >60 mL/min (ref >60)
GFR calc non Af Amer: >60 mL/min (ref >60)
Glucose, Bld: 80 mg/dL (ref 70–99)
Potassium: 4.1 mmol/L (ref 3.5–5.1)
Sodium: 136 mmol/L (ref 135–145)
Total Bilirubin: 0.3 mg/dL (ref 0.3–1.2)
Total Protein: 6.5 g/dL (ref 6.5–8.1)

## 2019-10-17 LAB — CBC WITH DIFFERENTIAL/PLATELET
Abs Immature Granulocytes: 0 10*3/uL (ref 0.00–0.07)
Basophils Absolute: 0 10*3/uL (ref 0.0–0.1)
Basophils Relative: 0 %
Eosinophils Absolute: 0.1 10*3/uL (ref 0.0–0.5)
Eosinophils Relative: 2 %
HCT: 33.8 % — ABNORMAL LOW (ref 36.0–46.0)
Hemoglobin: 10.7 g/dL — ABNORMAL LOW (ref 12.0–15.0)
Immature Granulocytes: 0 %
Lymphocytes Relative: 49 %
Lymphs Abs: 2.4 10*3/uL (ref 0.7–4.0)
MCH: 28.2 pg (ref 26.0–34.0)
MCHC: 31.7 g/dL (ref 30.0–36.0)
MCV: 88.9 fL (ref 80.0–100.0)
Monocytes Absolute: 0.6 10*3/uL (ref 0.1–1.0)
Monocytes Relative: 12 %
Neutro Abs: 1.8 10*3/uL (ref 1.7–7.7)
Neutrophils Relative %: 37 %
Platelets: 187 10*3/uL (ref 150–400)
RBC: 3.8 MIL/uL — ABNORMAL LOW (ref 3.87–5.11)
RDW: 13.1 % (ref 11.5–15.5)
WBC: 4.8 10*3/uL (ref 4.0–10.5)
nRBC: 0 % (ref 0.0–0.2)

## 2019-10-17 LAB — D-DIMER, QUANTITATIVE: D-Dimer, Quant: 0.31 ug/mL-FEU (ref 0.00–0.50)

## 2019-10-17 LAB — TROPONIN I (HIGH SENSITIVITY): Troponin I (High Sensitivity): 3 ng/L (ref <18)

## 2019-10-17 MED ORDER — SODIUM CHLORIDE 0.9 % IV BOLUS
1000.0000 mL | Freq: Once | INTRAVENOUS | Status: AC
  Administered 2019-10-17: 1000 mL via INTRAVENOUS

## 2019-10-17 MED ORDER — PROMETHAZINE HCL 25 MG/ML IJ SOLN
25.0000 mg | Freq: Once | INTRAMUSCULAR | Status: AC
  Administered 2019-10-17: 25 mg via INTRAVENOUS
  Filled 2019-10-17: qty 1

## 2019-10-17 MED ORDER — HYDROMORPHONE HCL 1 MG/ML IJ SOLN
1.0000 mg | Freq: Once | INTRAMUSCULAR | Status: AC
  Administered 2019-10-17: 1 mg via INTRAVENOUS
  Filled 2019-10-17: qty 1

## 2019-10-17 MED ORDER — HEPARIN SOD (PORK) LOCK FLUSH 100 UNIT/ML IV SOLN
500.0000 [IU] | Freq: Once | INTRAVENOUS | Status: AC
  Administered 2019-10-17: 500 [IU]
  Filled 2019-10-17: qty 5

## 2019-10-17 NOTE — ED Triage Notes (Signed)
Chest pain x 2 weeks

## 2019-10-17 NOTE — ED Notes (Signed)
Patient transported to X-ray 

## 2019-10-17 NOTE — Telephone Encounter (Signed)
Spoke to pt who report chest pain for the past week. She describes symptoms as a tightness feeling and notice it more with exertion. Pt feels she is currently experiencing chest pain that radiate up her throat. She also report feeling real hot and clammy.  Nurse advised pt to report to ER for further evaluations. Pt verbalized understanding.

## 2019-10-17 NOTE — ED Provider Notes (Signed)
[facility] EMERGENCY DEPARTMENT Provider Note   CSN: [id number] Arrival date & time: 10/17/19  1848     History Chief Complaint  Patient presents with  . Chest Pain    Paula [name] is a 54 y.o. female history of gastric cancer, PE not on anticoagulation, here presenting with shortness of breath.  Patient has some subjective shortness of breath for the last 2 weeks.  Patient initially thought she had Covid and had 2 negative Covid tests. She has persistent shortness of breath and chest pressure for the last 2 weeks.  She called her cardiologist who sent here for evaluation.  Patient states that it is hard time for her to take a deep breath.  She also has some chest pressure as well.  She states that she had previous PE but no longer on anticoagulation.  Patient denies any recent travel.  Patient denies any Covid exposure.  Patient denies any stents in her heart.  The history is provided by the patient.       Past Medical History:  Diagnosis Date  . Anxiety    per pt  . Arthritis    back  . Costochondritis   . Esophageal reflux    On omeprazole  . Family history of cancer   . Headache(784.0) 2011   Migraines  . Heart murmur    never caused any problems  . History of hiatal hernia   . History of radiation therapy 05/12/14- 06/17/14   post-op gastric region/ nodes/ 45 Gy in 25 fractions.   . Hypertension    started around 2011  . Iron deficiency anemia due to chronic blood loss 05/25/2015  . 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 On antineoplastic chemotherapy    5-FU and leucovorin  . Ovarian cyst   . Pneumonia    x 1  . Pulmonary emboli ([place]) 2015  . Stomach cancer ([place]) dx'd 08/2013  . Tuberculosis    ? [id number]  . Wears partial dentures    upper dentures    Patient Active Problem List   Diagnosis Date Noted  . Right leg pain 10/15/2018  . Concussion with no loss of consciousness 09/10/2018  . Muscle pain 09/10/2018  . S/P vaginal hysterectomy  11/09/2017  . Left knee pain 04/25/2017  . Iron deficiency anemia due to chronic blood loss 05/25/2015  . Healed or old pulmonary embolism 03/31/2015  . Genetic testing 01/23/2015  . Insomnia 06/30/2014  . Neutropenia ([place]) 02/24/2014  . 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 Hypoalbuminemia 12/18/2013  . Malfunction of device 12/18/2013  . Leg edema 12/18/2013  . Neoplasm related pain 12/18/2013  . Other pancytopenia ([place]) 12/13/2013  . Hyponatremia 12/12/2013  . Hypokalemia 12/12/2013  . Weight loss 12/04/2013  . Abnormal loss of weight 12/04/2013  . Family history of cancer   . Cancer of antrum of stomach ([place]) 11/07/2013  . 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 Palliative care encounter 11/04/2013  . Acute kidney injury ([place]) 11/03/2013  . Chronic low back pain 10/31/2013  . 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 Constipation, chronic 10/28/2013  . Protein-calorie malnutrition, severe ([place]) 10/23/2013  . Intractable nausea and vomiting 10/22/2013  . Intractable vomiting 10/22/2013  . 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 Carcinoma of stomach ([place]) 09/18/2013  . Antral ulcer 09/16/2013  . Migraine headache 09/14/2013  . PTSD (post-traumatic stress disorder) 08/19/2013  . Left knee injury 07/30/2013  .  Mitral regurgitation 02/15/2013  . Chest pain 01/23/2012  . Essential hypertension 01/23/2012  . Murmur 01/23/2012    Past Surgical History:  Procedure Laterality Date  . ABDOMINAL HYSTERECTOMY    . CESAREAN SECTION     x 2  . ESOPHAGOGASTRODUODENOSCOPY N/A 09/16/2013   Procedure: ESOPHAGOGASTRODUODENOSCOPY (EGD);  Surgeon: Wonda Horner, MD;  Location: [facility];  Service: Endoscopy;  Laterality: N/A;  . GASTROSTOMY     r/t stomach cancer  . IR CV LINE INJECTION  08/30/2017  . IR FLUORO GUIDE PORT INSERTION RIGHT  03/21/2017  . IR US GUIDE VASC ACCESS RIGHT   03/21/2017  . LAPAROSCOPY N/A 09/20/2013   Procedure: DIAGNOSTIC LAPAROSCOPY,DISTAL GASTRECTOMY AND FEEDING GASTROJEJUNOSTOMY;  Surgeon: [name], MD;  Location: [facility];  Service: General;  Laterality: N/A;  . OOPHORECTOMY     Around 1985 left ovary removal  . PORTACATH PLACEMENT Left 11/18/2013   Procedure: INSERTION PORT-A-CATH;  Surgeon: [name], MD;  Location: [facility];  Service: General;  Laterality: Left;  . TONSILLECTOMY     Around 1994  . TUBAL LIGATION    . UNILATERAL SALPINGECTOMY Left 11/09/2017   Procedure: UNILATERAL SALPINGECTOMY;  Surgeon: Osborne Oman, MD;  Location: [facility];  Service: Gynecology;  Laterality: Left;  . UPPER GASTROINTESTINAL ENDOSCOPY    . VAGINAL HYSTERECTOMY Left 11/09/2017   Procedure: HYSTERECTOMY VAGINAL WITH LEFT SALPINGECTOMY;  Surgeon: Osborne Oman, MD;  Location: [facility];  Service: Gynecology;  Laterality: Left;     OB History    Gravida  2   Para  2   Term  1   Preterm  1   AB      Living  2     SAB      TAB      Ectopic      Multiple      Live Births  2           Family History  Problem Relation Age of Onset  . Hypertension Mother   . Diabetes Mellitus II Mother   . Other Mother        hx of hysterectomy for unspecified reason  . Hypertension Sister   . Hypertension Brother   . Heart failure Maternal Grandmother   . Heart Problems Maternal Grandmother   . Hypertension Maternal Grandfather   . Diabetes Maternal Grandfather   . Colon cancer Paternal Uncle        dx. late 50s-early 60s  . Gastric cancer Cousin 11  . Colon cancer Cousin        dx. early-late 70s; (father also had colon cancer)  . Colon cancer Paternal Uncle        dx. late 50s-early 60s  . Lung cancer Paternal Uncle        dx. 44s; smoker  . Colon polyps Paternal Uncle        unknown number  . Heart attack Maternal Aunt   . Heart Problems Paternal Aunt   . Heart attack Paternal Grandfather   . Colon cancer Paternal Grandfather          dx. 50s-60s  . Colon polyps Brother        1 maternal half-brother had approx 3 polyps  . Breast cancer Cousin 72  . Breast cancer Cousin        dx. early 14s or earlier (father had colon cancer)  . Lung cancer Other        PGF's brothers (x4); dx. later in  life; lim info  . Esophageal cancer Neg Hx   . Rectal cancer Neg Hx     Social History   Tobacco Use  . Smoking status: Former Smoker    Packs/day: 1.00    Years: 16.00    Pack years: 16.00    Types: Cigarettes    Quit date: 02/21/1998    Years since quitting: 21.6  . Smokeless tobacco: Never Used  . Tobacco comment: 0.5-1.0 ppd for 16 yrs  Vaping Use  . Vaping Use: Never used  Substance Use Topics  . Alcohol use: Yes    Alcohol/week: 0.0 standard drinks    Comment: occasional wine  . Drug use: Yes    Types: Marijuana    Comment: 03/04/2010    Home Medications Prior to Admission medications   Medication Sig Start Date End Date Taking? Authorizing Provider  carvedilol (COREG) 6.25 MG tablet Take 0.5 tablets (3.125 mg total) by mouth 2 (two) times daily. 01/28/19   Lelon Perla, MD  dronabinol (MARINOL) 5 MG capsule Take 1 capsule (5 mg total) by mouth 2 (two) times daily before lunch and supper. 04/30/19   [place], Holli Humbles, NP  fluticasone (FLONASE) 50 MCG/ACT nasal spray Place 1 spray into both nostrils daily. 12/10/18   Khatri, Hina, PA-C  HYDROcodone-acetaminophen (NORCO/VICODIN) 5-325 MG tablet Take 1 tablet by mouth every 6 (six) hours as needed for moderate pain. 09/20/19   Volanda Napoleon, MD  lidocaine-prilocaine (EMLA) cream Apply 1 application topically as needed. 02/05/19   Volanda Napoleon, MD  nitroGLYCERIN (NITROSTAT) 0.4 MG SL tablet Place 1 tablet (0.4 mg total) under the tongue every 5 (five) minutes as needed for chest pain. Patient not taking: Reported on 09/10/2019 12/20/18 05/15/19  Lendon Colonel, NP  omeprazole (PRILOSEC) 20 MG capsule Take 1 capsule (20 mg total) by mouth daily.  09/10/19   Volanda Napoleon, MD  ondansetron (ZOFRAN ODT) 4 MG disintegrating tablet Take 1 tablet (4 mg total) by mouth every 8 (eight) hours as needed for nausea or vomiting. 03/14/19   Jackquline Denmark, MD  promethazine (PHENERGAN) 25 MG tablet Take 1 tablet (25 mg total) by mouth every 6 (six) hours as needed for nausea or vomiting. 09/10/19   [place], Holli Humbles, NP  tiZANidine (ZANAFLEX) 2 MG tablet Take 2 mg by mouth 3 (three) times daily. 05/21/19   [provider]    Allergies    Tape  Review of Systems   Review of Systems  Cardiovascular: Positive for chest pain.  All other systems reviewed and are negative.   Physical Exam Updated Vital Signs BP 130/82   Pulse 64   Temp 98.6 F (37 C) (Oral)   Resp 20   Ht 5\' 2"  (1.575 m)   Wt 48.1 kg   LMP 02/28/2016 (Exact Date)   SpO2 100%   BMI 19.39 kg/m   Physical Exam Vitals and nursing note reviewed.  HENT:     Head: Normocephalic.  Eyes:     Pupils: Pupils are equal, round, and reactive to light.  Cardiovascular:     Rate and Rhythm: Normal rate and regular rhythm.     Heart sounds: Normal heart sounds.  Pulmonary:     Effort: Pulmonary effort is normal.     Breath sounds: Normal breath sounds.  Abdominal:     General: Bowel sounds are normal.     Palpations: Abdomen is soft.  Musculoskeletal:        General: Normal range of  motion.     Cervical back: Normal range of motion and neck supple.  Skin:    General: Skin is warm.     Capillary Refill: Capillary refill takes less than 2 seconds.  Neurological:     General: No focal deficit present.     Mental Status: She is alert and oriented to person, place, and time.  Psychiatric:        Mood and Affect: Mood normal.        Behavior: Behavior normal.     ED Results / Procedures / Treatments   Labs (all labs ordered are listed, but only abnormal results are displayed) Labs Reviewed  CBC WITH DIFFERENTIAL/PLATELET - Abnormal; Notable for the following  components:      Result Value   RBC 3.80 (*)    Hemoglobin 10.7 (*)    HCT 33.8 (*)    All other components within normal limits  COMPREHENSIVE METABOLIC PANEL - Abnormal; Notable for the following components:   Calcium 8.8 (*)    All other components within normal limits  D-DIMER, QUANTITATIVE (NOT AT [facility])  TROPONIN I (HIGH SENSITIVITY)  TROPONIN I (HIGH SENSITIVITY)    EKG EKG Interpretation  Date/Time:  Thursday October 17 2019 19:02:34 EDT Ventricular Rate:  68 PR Interval:    QRS Duration: 78 QT Interval:  410 QTC Calculation: 436 R Axis:   67 Text Interpretation: Sinus rhythm Atrial premature complex Consider left ventricular hypertrophy No significant change since last tracing Confirmed by Wandra Arthurs [phone]) on 10/17/2019 7:10:37 PM   Radiology DG Chest 2 View  Result Date: 10/17/2019 CLINICAL DATA:  Chest pain for 2 weeks. EXAM: CHEST - 2 VIEW COMPARISON:  02/23/2019 FINDINGS: Stable scarring at the right base. Left lung appears clear. The cardiopericardial silhouette is within normal limits for size. Right Port-A-Cath again noted. The visualized bony structures of the thorax show now acute abnormality. IMPRESSION: Stable scarring at the right base.  No acute findings. Electronically Signed   By: Misty Stanley M.D.   On: 10/17/2019 19:29    Procedures Procedures (including critical care time)  Medications Ordered in ED Medications  promethazine (PHENERGAN) injection 25 mg (25 mg Intravenous Given 10/17/19 2003)  sodium chloride 0.9 % bolus 1,000 mL (1,000 mLs Intravenous New Bag/Given 10/17/19 2002)  HYDROmorphone (DILAUDID) injection 1 mg (1 mg Intravenous Given 10/17/19 2007)    ED Course  I have reviewed the triage vital signs and the nursing notes.  Pertinent labs & imaging results that were available during my care of the patient were reviewed by me and considered in my medical decision making (see chart for details).    MDM Rules/Calculators/A&P                           Emmarae A [name] is a 54 y.o. female presenting with left-sided chest pain.  Patient had multiple negative Covid test.  She also has some subjective shortness of breath.  She is not hypoxic and heart rate is normal.  Consider history of malignancy and PE, will get a D-dimer to rule out PE.  Low suspicion for ACS and symptoms for 2 weeks so 1 set of troponin sufficient.  Plan to get CBC, CMP, troponin, D-dimer, chest x-ray.  Will hydrate and reassess.  9:07 PM Labs unremarkable. Trop and D-dimer negative. Stable for discharge. She can follow up with her cardiologist outpatient   Final Clinical Impression(s) / ED Diagnoses Final diagnoses:  None    Rx / DC Orders ED Discharge Orders    None       Drenda Freeze, MD 10/17/19 2107

## 2019-10-17 NOTE — Telephone Encounter (Signed)
Pt c/o of Chest Pain: STAT if CP now or developed within 24 hours  1. Are you having CP right now? Yes, it is a slight pain though. She states even though it is a slight pain, she feels like she can't breath.   2. Are you experiencing any other symptoms (ex. SOB, nausea, vomiting, sweating)? Dizzy, bp has been low. She thought it was covid so she was tested - she is neg. Yesterday felt like her heart was beating real fast. She states that it hurts when she takes a deep breath - it will hurt from the middle of her chest to her throat.   3. How long have you been experiencing CP? x3 weeks   4. Is your CP continuous or coming and going? Comes and goes  5. Have you taken Nitroglycerin? She has it on hand but hasn't taken any. She states she is unsure when she should take it, as in how bad should the pain get before she takes it.  ?

## 2019-10-17 NOTE — Telephone Encounter (Signed)
Attempted to call NL triage x3, no answer.

## 2019-10-17 NOTE — Discharge Instructions (Signed)
See your cardiologist for follow-up if you have persistent chest pain.  You were tested negative for Covid earlier and your heart enzyme tests and D-dimer were normal.  See your doctor for follow-up as well  Return to ER if you have worse chest pain, trouble breathing, fever.

## 2019-10-21 ENCOUNTER — Other Ambulatory Visit: Payer: Self-pay | Admitting: *Deleted

## 2019-10-21 DIAGNOSIS — C163 Malignant neoplasm of pyloric antrum: Secondary | ICD-10-CM

## 2019-10-21 DIAGNOSIS — C169 Malignant neoplasm of stomach, unspecified: Secondary | ICD-10-CM

## 2019-10-21 MED ORDER — HYDROCODONE-ACETAMINOPHEN 5-325 MG PO TABS: 1.0000 | ORAL_TABLET | Freq: Four times a day (QID) | ORAL | 0 refills | Status: DC | PRN

## 2019-10-22 ENCOUNTER — Inpatient Hospital Stay: Payer: Medicare Other | Attending: Hematology & Oncology

## 2019-10-22 ENCOUNTER — Ambulatory Visit (INDEPENDENT_AMBULATORY_CARE_PROVIDER_SITE_OTHER): Payer: Medicare Other | Admitting: Cardiology

## 2019-10-22 ENCOUNTER — Other Ambulatory Visit: Payer: Self-pay

## 2019-10-22 ENCOUNTER — Encounter: Payer: Self-pay | Admitting: Cardiology

## 2019-10-22 VITALS — BP 110/64 | HR 72 | Ht 62.0 in | Wt 111.2 lb

## 2019-10-22 VITALS — BP 110/66 | HR 66 | Resp 18

## 2019-10-22 DIAGNOSIS — R0789 Other chest pain: Secondary | ICD-10-CM

## 2019-10-22 DIAGNOSIS — R112 Nausea with vomiting, unspecified: Secondary | ICD-10-CM | POA: Diagnosis not present

## 2019-10-22 DIAGNOSIS — Z95828 Presence of other vascular implants and grafts: Secondary | ICD-10-CM

## 2019-10-22 DIAGNOSIS — Z85028 Personal history of other malignant neoplasm of stomach: Secondary | ICD-10-CM | POA: Diagnosis present

## 2019-10-22 DIAGNOSIS — C169 Malignant neoplasm of stomach, unspecified: Secondary | ICD-10-CM

## 2019-10-22 DIAGNOSIS — Z452 Encounter for adjustment and management of vascular access device: Secondary | ICD-10-CM | POA: Insufficient documentation

## 2019-10-22 DIAGNOSIS — Z86711 Personal history of pulmonary embolism: Secondary | ICD-10-CM | POA: Diagnosis not present

## 2019-10-22 MED ORDER — HEPARIN SOD (PORK) LOCK FLUSH 100 UNIT/ML IV SOLN
500.0000 [IU] | Freq: Once | INTRAVENOUS | Status: AC
  Administered 2019-10-22: 500 [IU] via INTRAVENOUS
  Filled 2019-10-22: qty 5

## 2019-10-22 MED ORDER — SODIUM CHLORIDE 0.9% FLUSH
10.0000 mL | INTRAVENOUS | Status: DC | PRN
  Administered 2019-10-22: 10 mL via INTRAVENOUS
  Filled 2019-10-22: qty 10

## 2019-10-22 NOTE — Assessment & Plan Note (Signed)
Negative D-dimer 10/17/2019

## 2019-10-22 NOTE — Patient Instructions (Signed)
Implanted Port Insertion, Care After °This sheet gives you information about how to care for yourself after your procedure. Your health care provider may also give you more specific instructions. If you have problems or questions, contact your health care provider. °What can I expect after the procedure? °After the procedure, it is common to have: °· Discomfort at the port insertion site. °· Bruising on the skin over the port. This should improve over 3-4 days. °Follow these instructions at home: °Port care °· After your port is placed, you will get a manufacturer's information card. The card has information about your port. Keep this card with you at all times. °· Take care of the port as told by your health care provider. Ask your health care provider if you or a family member can get training for taking care of the port at home. A home health care nurse may also take care of the port. °· Make sure to remember what type of port you have. °Incision care ° °  ° °· Follow instructions from your health care provider about how to take care of your port insertion site. Make sure you: °? Wash your hands with soap and water before and after you change your bandage (dressing). If soap and water are not available, use hand sanitizer. °? Change your dressing as told by your health care provider. °? Leave stitches (sutures), skin glue, or adhesive strips in place. These skin closures may need to stay in place for 2 weeks or longer. If adhesive strip edges start to loosen and curl up, you may trim the loose edges. Do not remove adhesive strips completely unless your health care provider tells you to do that. °· Check your port insertion site every day for signs of infection. Check for: °? Redness, swelling, or pain. °? Fluid or blood. °? Warmth. °? Pus or a bad smell. °Activity °· Return to your normal activities as told by your health care provider. Ask your health care provider what activities are safe for you. °· Do not  lift anything that is heavier than 10 lb (4.5 kg), or the limit that you are told, until your health care provider says that it is safe. °General instructions °· Take over-the-counter and prescription medicines only as told by your health care provider. °· Do not take baths, swim, or use a hot tub until your health care provider approves. Ask your health care provider if you may take showers. You may only be allowed to take sponge baths. °· Do not drive for 24 hours if you were given a sedative during your procedure. °· Wear a medical alert bracelet in case of an emergency. This will tell any health care providers that you have a port. °· Keep all follow-up visits as told by your health care provider. This is important. °Contact a health care provider if: °· You cannot flush your port with saline as directed, or you cannot draw blood from the port. °· You have a fever or chills. °· You have redness, swelling, or pain around your port insertion site. °· You have fluid or blood coming from your port insertion site. °· Your port insertion site feels warm to the touch. °· You have pus or a bad smell coming from the port insertion site. °Get help right away if: °· You have chest pain or shortness of breath. °· You have bleeding from your port that you cannot control. °Summary °· Take care of the port as told by your health   care provider. Keep the manufacturer's information card with you at all times. °· Change your dressing as told by your health care provider. °· Contact a health care provider if you have a fever or chills or if you have redness, swelling, or pain around your port insertion site. °· Keep all follow-up visits as told by your health care provider. °This information is not intended to replace advice given to you by your health care provider. Make sure you discuss any questions you have with your health care provider. °Document Revised: 09/05/2017 Document Reviewed: 09/05/2017 °Elsevier Patient Education ©  2020 [employer] ° °

## 2019-10-22 NOTE — Progress Notes (Signed)
Cardiology Office Note:    Date:  10/22/2019   ID:  Paula [name], DOB September 01, 1965, MRN [id number]  PCP:  Carmon Ginsberg, FNP (Inactive)  Cardiologist:  Dr Stanford Breed Electrophysiologist:  None   Referring MD: No ref. provider found   CC:  Chest pain  History of Present Illness:    Paula [name] is a 54 y.o. female with a hx of gastric cancer in 2015.  This was treated with chemo and radiation.  She says she spent 40 days in the hospital.  She has had some issues with nausea since then. She is being followed at [facility].  She also had a pulmonary embolism in 2015, she is no longer on anticoagulation.    From a cardiac standpoint she has had past chest pain and palpitations.  Myoview in 2018 showed sinus rhythm with ectopy and a brief run of PSVT.  Echocardiogram in June 2018 showed normal LV function with mild MR.  An exercise nuclear stress test was done November 2020.  The patient was able to exercise for 10 minutes and achieved 91% of her expected maximum heart rate.  There were no ST changes.  Images showed no evidence of ischemia or infarct.  Her EF was 53%.  The patient's been seen in the ER twice this month with complaints of chest pain.  D-dimer is but normal.  Troponin has been negative x2.  Her EKG is normal.  She describes a pressure sensation "like someone sitting on my chest".  Sometimes it is worse when she takes a deep breath.  It does seem to radiate from her epigastric area up into her upper chest.  She admits she has had lots of nausea and some vomiting.  She is unable to take NSAIDs.  There does not appear to be an exertional component.  She did take some aspirin and she says rest gives her the best relief.  Past Medical History:  Diagnosis Date  . Anxiety    per pt  . Arthritis    back  . Costochondritis   . Esophageal reflux    On omeprazole  . Family history of cancer   . Headache(784.0) 2011   Migraines  . Heart murmur    never caused any  problems  . History of hiatal hernia   . History of radiation therapy 05/12/14- 06/17/14   post-op gastric region/ nodes/ 45 Gy in 25 fractions.   . Hypertension    started around 2011  . Iron deficiency anemia due to chronic blood loss 05/25/2015  . 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 On antineoplastic chemotherapy    5-FU and leucovorin  . Ovarian cyst   . Pneumonia    x 1  . Pulmonary emboli ([place]) 2015  . Stomach cancer ([place]) dx'd 08/2013  . Tuberculosis    ? [id number]  . Wears partial dentures    upper dentures    Past Surgical History:  Procedure Laterality Date  . ABDOMINAL HYSTERECTOMY    . CESAREAN SECTION     x 2  . ESOPHAGOGASTRODUODENOSCOPY N/A 09/16/2013   Procedure: ESOPHAGOGASTRODUODENOSCOPY (EGD);  Surgeon: Wonda Horner, MD;  Location: [facility];  Service: Endoscopy;  Laterality: N/A;  . GASTROSTOMY     r/t stomach cancer  . IR CV LINE INJECTION  08/30/2017  . IR FLUORO GUIDE PORT INSERTION RIGHT  03/21/2017  . IR US GUIDE VASC ACCESS RIGHT  03/21/2017  . LAPAROSCOPY N/A 09/20/2013   Procedure: DIAGNOSTIC LAPAROSCOPY,DISTAL GASTRECTOMY AND FEEDING GASTROJEJUNOSTOMY;  Surgeon: [name], MD;  Location: [facility];  Service: General;  Laterality: N/A;  . OOPHORECTOMY     Around 1985 left ovary removal  . PORTACATH PLACEMENT Left 11/18/2013   Procedure: INSERTION PORT-A-CATH;  Surgeon: [name], MD;  Location: [facility];  Service: General;  Laterality: Left;  . TONSILLECTOMY     Around 1994  . TUBAL LIGATION    . UNILATERAL SALPINGECTOMY Left 11/09/2017   Procedure: UNILATERAL SALPINGECTOMY;  Surgeon: Osborne Oman, MD;  Location: [facility];  Service: Gynecology;  Laterality: Left;  . UPPER GASTROINTESTINAL ENDOSCOPY    . VAGINAL HYSTERECTOMY Left 11/09/2017   Procedure: HYSTERECTOMY VAGINAL WITH LEFT SALPINGECTOMY;  Surgeon: Osborne Oman, MD;  Location: [facility];  Service: Gynecology;  Laterality: Left;    Current Medications: Current Meds  Medication Sig  .  carvedilol (COREG) 6.25 MG tablet Take 0.5 tablets (3.125 mg total) by mouth 2 (two) times daily.  Marland Kitchen dronabinol (MARINOL) 5 MG capsule Take 1 capsule (5 mg total) by mouth 2 (two) times daily before lunch and supper.  . fluticasone (FLONASE) 50 MCG/ACT nasal spray Place 1 spray into both nostrils daily.  Marland Kitchen HYDROcodone-acetaminophen (NORCO/VICODIN) 5-325 MG tablet Take 1 tablet by mouth every 6 (six) hours as needed for moderate pain.  Marland Kitchen lidocaine-prilocaine (EMLA) cream Apply 1 application topically as needed.  Marland Kitchen omeprazole (PRILOSEC) 20 MG capsule Take 1 capsule (20 mg total) by mouth daily.  . ondansetron (ZOFRAN ODT) 4 MG disintegrating tablet Take 1 tablet (4 mg total) by mouth every 8 (eight) hours as needed for nausea or vomiting.  . promethazine (PHENERGAN) 25 MG tablet Take 1 tablet (25 mg total) by mouth every 6 (six) hours as needed for nausea or vomiting.     Allergies:   Tape   Social History   Socioeconomic History  . Marital status: Legally Separated    Spouse name: Cornelia Copa  . Number of children: 2  . Years of education: Not on file  . Highest education level: Not on file  Occupational History    Employer: [employer]  Tobacco Use  . Smoking status: Former Smoker    Packs/day: 1.00    Years: 16.00    Pack years: 16.00    Types: Cigarettes    Quit date: 02/21/1998    Years since quitting: 21.6  . Smokeless tobacco: Never Used  . Tobacco comment: 0.5-1.0 ppd for 16 yrs  Vaping Use  . Vaping Use: Never used  Substance and Sexual Activity  . Alcohol use: Yes    Alcohol/week: 0.0 standard drinks    Comment: occasional wine  . Drug use: Yes    Types: Marijuana    Comment: 03/04/2010  . Sexual activity: Not Currently    Birth control/protection: Surgical    Comment: Depo Inj 09/2017  Other Topics Concern  . Not on file  Social History Narrative  . Not on file   Social Determinants of Health   Financial Resource Strain:   . Difficulty of Paying Living  Expenses: Not on file  Food Insecurity:   . Worried About Charity fundraiser in the Last Year: Not on file  . Ran Out of Food in the Last Year: Not on file  Transportation Needs:   . Lack of Transportation (Medical): Not on file  . Lack of Transportation (Non-Medical): Not on file  Physical Activity:   . Days of Exercise per Week: Not on file  . Minutes of Exercise per Session: Not  on file  Stress:   . Feeling of Stress : Not on file  Social Connections:   . Frequency of Communication with Friends and Family: Not on file  . Frequency of Social Gatherings with Friends and Family: Not on file  . Attends Religious Services: Not on file  . Active Member of Clubs or Organizations: Not on file  . Attends Archivist Meetings: Not on file  . Marital Status: Not on file     Family History: The patient's family history includes Breast cancer in her cousin; Breast cancer (age of onset: 33) in her cousin; Colon cancer in her cousin, paternal grandfather, paternal uncle, and paternal uncle; Colon polyps in her brother and paternal uncle; Diabetes in her maternal grandfather; Diabetes Mellitus II in her mother; Gastric cancer (age of onset: 58) in her cousin; Heart Problems in her maternal grandmother and paternal aunt; Heart attack in her maternal aunt and paternal grandfather; Heart failure in her maternal grandmother; Hypertension in her brother, maternal grandfather, mother, and sister; Lung cancer in her paternal uncle and another family member; Other in her mother. There is no history of Esophageal cancer or Rectal cancer.  ROS:   Please see the history of present illness.     All other systems reviewed and are negative.  EKGs/Labs/Other Studies Reviewed:    The following studies were reviewed today: Nuclear stress 12/27/2018  EKG:  EKG is not ordered today.  The ekg ordered 10/17/3019 demonstrates NSR- no acute changes  Recent Labs: 04/30/2019: TSH 0.831 10/17/2019: ALT 22; BUN  12; Creatinine, Ser 0.87; Hemoglobin 10.7; Platelets 187; Potassium 4.1; Sodium 136  Recent Lipid Panel    Component Value Date/Time   TRIG 88 11/04/2013 0410    Physical Exam:    VS:  BP 110/64   Pulse 72   Ht 5\' 2"  (1.575 m)   Wt 111 lb 3.2 oz (50.4 kg)   LMP 02/28/2016 (Exact Date)   BMI 20.34 kg/m     Wt Readings from Last 3 Encounters:  10/22/19 111 lb 3.2 oz (50.4 kg)  10/17/19 106 lb (48.1 kg)  09/27/19 108 lb (49 kg)     GEN: Thin AA female, well developed in no acute distress HEENT: Normal NECK: No JVD; No carotid bruits LYMPHATICS: No lymphadenopathy CARDIAC: RRR, no murmurs, rubs, gallops RESPIRATORY:  Scatter rhonchi ABDOMEN: Soft, non-tender, non-distended MUSCULOSKELETAL:  No edema; No deformity  SKIN: Warm and dry NEUROLOGIC:  Alert and oriented x 3 PSYCHIATRIC:  Normal affect   ASSESSMENT:    Chest pain Based on atypical symptoms, recent negative troponin, negative GXT Nuclear stress in Nov 2020, and normal EKG I have a low suspicion for cardiac etiology.  I expect it's from nausea and vomiting.  She has a f/u with her gastroenterologist in 2 days.  She is on a PPI and Marinol.    Healed or old pulmonary embolism Negative D-dimer 10/17/2019  Intractable nausea and vomiting F/U with [facility]   H/O gastric cancer- [facility] follows  PLAN:    I would only consider further ischemic work up if her symptoms become more typical.  F/U with Dr Stanford Breed in 3-4 months.   Medication Adjustments/Labs and Tests Ordered: Current medicines are reviewed at length with the patient today.  Concerns regarding medicines are outlined above.  No orders of the defined types were placed in this encounter.  No orders of the defined types were placed in this encounter.   Patient Instructions  Medication Instructions:  Your  physician recommends that you continue on your current medications as directed. Please refer to the Current Medication list given to you today.  *If you  need a refill on your cardiac medications before your next appointment, please call your pharmacy*  Lab Work: NONE ordered at this time of appointment   If you have labs (blood work) drawn today and your tests are completely normal, you will receive your results only by: Marland Kitchen MyChart Message (if you have MyChart) OR . A paper copy in the mail If you have any lab test that is abnormal or we need to change your treatment, we will call you to review the results.  Testing/Procedures: NONE ordered at this time of appointment   Follow-Up: At [facility], you and your health needs are our priority.  As part of our continuing mission to provide you with exceptional heart care, we have created designated Provider Care Teams.  These Care Teams include your primary Cardiologist (physician) and Advanced Practice Providers (APPs -  Physician Assistants and Nurse Practitioners) who all work together to provide you with the care you need, when you need it.   Your next appointment:   3 month(s)  The format for your next appointment:   In Person  Provider:   Kirk Ruths, MD  Other Instructions   Give our office a call if you develop any new or worsen symptoms or go to the nearest emergency department  Give our office a call on Friday 10/25/19 after your appointment      Signed, Kerin Ransom, PA-C  10/22/2019 2:39 PM    [place]

## 2019-10-22 NOTE — Assessment & Plan Note (Signed)
F/U with [facility]

## 2019-10-22 NOTE — Assessment & Plan Note (Addendum)
Based on atypical symptoms, recent negative troponin, negative GXT Nuclear stress in Nov 2020, and normal EKG I have a low suspicion for cardiac etiology.  I expect it's from nausea and vomiting.  She has a f/u with her gastroenterologist in 2 days.  She is on a PPI and Marinol.

## 2019-10-22 NOTE — Patient Instructions (Signed)
Medication Instructions:  Your physician recommends that you continue on your current medications as directed. Please refer to the Current Medication list given to you today.  *If you need a refill on your cardiac medications before your next appointment, please call your pharmacy*  Lab Work: NONE ordered at this time of appointment   If you have labs (blood work) drawn today and your tests are completely normal, you will receive your results only by: Marland Kitchen MyChart Message (if you have MyChart) OR . A paper copy in the mail If you have any lab test that is abnormal or we need to change your treatment, we will call you to review the results.  Testing/Procedures: NONE ordered at this time of appointment   Follow-Up: At [facility], you and your health needs are our priority.  As part of our continuing mission to provide you with exceptional heart care, we have created designated Provider Care Teams.  These Care Teams include your primary Cardiologist (physician) and Advanced Practice Providers (APPs -  Physician Assistants and Nurse Practitioners) who all work together to provide you with the care you need, when you need it.   Your next appointment:   3 month(s)  The format for your next appointment:   In Person  Provider:   Kirk Ruths, MD  Other Instructions   Give our office a call if you develop any new or worsen symptoms or go to the nearest emergency department  Give our office a call on Friday 10/25/19 after your appointment

## 2019-10-25 ENCOUNTER — Ambulatory Visit (INDEPENDENT_AMBULATORY_CARE_PROVIDER_SITE_OTHER): Payer: Medicare Other | Admitting: Gastroenterology

## 2019-10-25 ENCOUNTER — Encounter: Payer: Self-pay | Admitting: Gastroenterology

## 2019-10-25 VITALS — BP 110/72 | HR 79 | Ht 62.0 in | Wt 109.2 lb

## 2019-10-25 DIAGNOSIS — R112 Nausea with vomiting, unspecified: Secondary | ICD-10-CM

## 2019-10-25 MED ORDER — OMEPRAZOLE 20 MG PO CPDR: 20.0000 mg | DELAYED_RELEASE_CAPSULE | Freq: Two times a day (BID) | ORAL | 2 refills | Status: DC

## 2019-10-25 MED FILL — OMEPRAZOLE 20 MG CAP: 20 | 30 days supply | Qty: 60 | Fill #0

## 2019-10-25 NOTE — Patient Instructions (Addendum)
If you are age 53 or older, your body mass index should be between 23-30. Your Body mass index is 19.98 kg/m. If this is out of the aforementioned range listed, please consider follow up with your Primary Care Provider.  If you are age 53 or younger, your body mass index should be between 19-25. Your Body mass index is 19.98 kg/m. If this is out of the aformentioned range listed, please consider follow up with your Primary Care Provider.   You have been scheduled for an Upper GI Series at  [facility].        Your appointment is on 11/08/19 at  9:30 am    Please arrive 15 minutes prior to your test for registration. Make sure not to eat or drink anything after midnight on the night before your test. If you need to reschedule, please call radiology at [phone]. ________________________________________________________________ An upper GI series uses x rays to help diagnose problems of the upper GI tract, which includes the esophagus, stomach, and duodenum. The duodenum is the first part of the small intestine. An upper GI series is conducted by a radiology technologist or a radiologist--a doctor who specializes in x-ray imaging--at a hospital or outpatient center. While sitting or standing in front of an x-ray machine, the patient drinks barium liquid, which is often white and has a chalky consistency and taste. The barium liquid coats the lining of the upper GI tract and makes signs of disease show up more clearly on x rays. X-ray video, called fluoroscopy, is used to view the barium liquid moving through the esophagus, stomach, and duodenum. Additional x rays and fluoroscopy are performed while the patient lies on an x-ray table. To fully coat the upper GI tract with barium liquid, the technologist or radiologist may press on the abdomen or ask the patient to change position. Patients hold still in various positions, allowing the technologist or radiologist to take x rays of the upper GI  tract at different angles. If a technologist conducts the upper GI series, a radiologist will later examine the images to look for problems.  This test typically takes about 1 hour to complete. _________________________________________________________________  STOP SMOKING. STOP Mariguana use.  We have sent the following medications to your pharmacy for you to pick up at your convenience: Omeprazole 20 mg take twice daily.  Take Miralax 17 grams daily. This is over-the-counter. Minimize narcotics. Can continue Phenergan as needed.  Follow up in 3 months. Please call the office for an appointment as the schedule is not available at this time.  Thank you,  Dr. Jackquline Denmark

## 2019-10-25 NOTE — Progress Notes (Signed)
Chief Complaint: N/V/wt loss  Referring Provider:  No ref. provider found      ASSESSMENT AND PLAN;   #1. N/V/wt loss - ?  Etiology. Neg CT AP 12/19/2018 for recurrence of gastric cancer.  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 Nl TSH. EGD January 2021 showed 2 cm Barrett's esophagus, small gastric remnant with angular takeoff of afferent/efferent limbs.  #2. Stage IIA (T1a, N2, M0) gastric AdenoCa s/p BII gastrectomy/feeding J tube July 2015 followed by neoadjuvant chemoradiation (Dr Barry Dienes). Neg CT AP 12/19/2018. EGD 01/2018, 02/2019 - as below. Neg for recurrence.  #3. IDA d/t Billroth II gastrectomy.  #4. Chronic constipation.  Never had screening colonoscopy. Refuses colon.  Plan: -UGI series to delineate anatomy. -Omeprazole 20 po BID, 60, 11 refills  -Miralax 17g po qd. -Quit smoking. -Stop marijauana use.  She can use Marinol as prescribed for now.  I would prefer to wean her off that too, if possible. Hot shower PRN helps. -I have encouraged her to eat small but more frequent meals 6-7 times/day.  She has pending dietary consultation. -IV iron per Dr Marin Olp (Hb 10.7 @baseline ) -Can continue to use phenergan prn as before. -Minimize narcotics. -FU in 12 weeks   HPI:    Paula [name] is a 54 y.o. female  With H/O stage II gastric AdenoCa s/p Billroth II gastrectomy 2015 followed by neoadjuvant chemoradiation.  Has been having persistent abdominal pain, intermittent N/V ever since. Neg CT AP as below 11/2018.  EGD 02/2019 showed small gastric remnant with moderate gastritis (neg Bx), angular takeoff of the efferent limb.  2 cm Barrett's esophagus without dysplasia.  Was very upset after EGD and lost to follow-up until today.  She has been tolerating omeprazole 20 mg p.o. once a day well.  It does help.  She has not recently tried it taking twice a day.  Continues to smoke.  She is highly motivated to quit smoking.  No  further significant weight loss.  She has been eating small but more frequent meals.  Has problems with constipation with hard BMs 2-3/week.  Would like to hold off on colonoscopy.   Wt Readings from Last 3 Encounters:  10/25/19 109 lb 4 oz (49.6 kg)  10/22/19 111 lb 3.2 oz (50.4 kg)  10/17/19 106 lb (48.1 kg)     Previous GI procedures:  Underwent CT Abdo/pelvis on 12/19/2018 which did not show any acute abnormalities.  No recurrence.  EGD 03/06/2019 - Moderate gastritis. (Likely etiology of recurrent anemia). - Likely 2 cm Barrett's esophagus (biopsied) - Patent Billroth II gastrojejunostomy with normal afferent/efferent limbs. Did have angular takeoff. -No recurrence of gastric cancer. -Bx: Eso: Barrett's esophagus without dysplasia. Gastric: Gastritis with negative HP.  EGD 02/07/2018 [facility] -Salmon-colored mucosa extending from 37 to 38 cm. -Billroth II gastrectomy.  Angular takeoff of both limbs. -Gastritis (Bx- neg) -Single 3 mm gastric polyp at anastomosis (Bx-hyperplastic polyp) -Small amount of food residue in stomach.  Bilious gastric fluid. -Dilated lacteals in the duodenum (Bx- neg, neg for celiac) -No recurrence.   Past Medical History:  Diagnosis Date  . Anxiety    per pt  . Arthritis    back  . Costochondritis   . Esophageal reflux    On omeprazole  . Family history of cancer   . Headache(784.0) 2011   Migraines  . Heart murmur    never caused any problems  . History of hiatal hernia   . History of radiation therapy 05/12/14- 06/17/14  post-op gastric region/ nodes/ 45 Gy in 25 fractions.   . Hypertension    started around 2011  . Iron deficiency anemia due to chronic blood loss 05/25/2015  . 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 On antineoplastic chemotherapy    5-FU and leucovorin  . Ovarian cyst   . Pneumonia    x 1  . Pulmonary emboli ([place] 2015  . Stomach cancer ([place]) dx'd 08/2013  . Tuberculosis    ? [id number]  . Wears partial dentures     upper dentures    Past Surgical History:  Procedure Laterality Date  . ABDOMINAL HYSTERECTOMY    . CESAREAN SECTION     x 2  . ESOPHAGOGASTRODUODENOSCOPY N/A 09/16/2013   Procedure: ESOPHAGOGASTRODUODENOSCOPY (EGD);  Surgeon: Wonda Horner, MD;  Location: [facility];  Service: Endoscopy;  Laterality: N/A;  . GASTROSTOMY     r/t stomach cancer  . IR CV LINE INJECTION  08/30/2017  . IR FLUORO GUIDE PORT INSERTION RIGHT  03/21/2017  . IR US GUIDE VASC ACCESS RIGHT  03/21/2017  . LAPAROSCOPY N/A 09/20/2013   Procedure: DIAGNOSTIC LAPAROSCOPY,DISTAL GASTRECTOMY AND FEEDING GASTROJEJUNOSTOMY;  Surgeon: [name], MD;  Location: [facility];  Service: General;  Laterality: N/A;  . OOPHORECTOMY     Around 1985 left ovary removal  . PORTACATH PLACEMENT Left 11/18/2013   Procedure: INSERTION PORT-A-CATH;  Surgeon: [name], MD;  Location: [facility];  Service: General;  Laterality: Left;  . TONSILLECTOMY     Around 1994  . TUBAL LIGATION    . UNILATERAL SALPINGECTOMY Left 11/09/2017   Procedure: UNILATERAL SALPINGECTOMY;  Surgeon: Osborne Oman, MD;  Location: [facility];  Service: Gynecology;  Laterality: Left;  . UPPER GASTROINTESTINAL ENDOSCOPY    . VAGINAL HYSTERECTOMY Left 11/09/2017   Procedure: HYSTERECTOMY VAGINAL WITH LEFT SALPINGECTOMY;  Surgeon: Osborne Oman, MD;  Location: [facility];  Service: Gynecology;  Laterality: Left;    Family History  Problem Relation Age of Onset  . Hypertension Mother   . Diabetes Mellitus II Mother   . Other Mother        hx of hysterectomy for unspecified reason  . Hypertension Sister   . Hypertension Brother   . Heart failure Maternal Grandmother   . Heart Problems Maternal Grandmother   . Hypertension Maternal Grandfather   . Diabetes Maternal Grandfather   . Colon cancer Paternal Uncle        dx. late 50s-early 60s  . Gastric cancer Cousin 28  . Colon cancer Cousin        dx. early-late 72s; (father also had colon cancer)  . Colon cancer  Paternal Uncle        dx. late 50s-early 60s  . Lung cancer Paternal Uncle        dx. 27s; smoker  . Colon polyps Paternal Uncle        unknown number  . Heart attack Maternal Aunt   . Heart Problems Paternal Aunt   . Heart attack Paternal Grandfather   . Colon cancer Paternal Grandfather        dx. 50s-60s  . Colon polyps Brother        1 maternal half-brother had approx 3 polyps  . Breast cancer Cousin 74  . Breast cancer Cousin        dx. early 23s or earlier (father had colon cancer)  . Lung cancer Other        PGF's brothers (x4); dx. later in life; lim info  .  Esophageal cancer Neg Hx   . Rectal cancer Neg Hx     Social History   Tobacco Use  . Smoking status: Former Smoker    Packs/day: 1.00    Years: 16.00    Pack years: 16.00    Types: Cigarettes    Quit date: 02/21/1998    Years since quitting: 21.6  . Smokeless tobacco: Never Used  . Tobacco comment: 0.5-1.0 ppd for 16 yrs  Vaping Use  . Vaping Use: Never used  Substance Use Topics  . Alcohol use: Not Currently    Alcohol/week: 0.0 standard drinks    Comment: occasional wine  . Drug use: Yes    Types: Marijuana    Comment: 03/04/2010    Current Outpatient Medications  Medication Sig Dispense Refill  . carvedilol (COREG) 6.25 MG tablet Take 0.5 tablets (3.125 mg total) by mouth 2 (two) times daily. 180 tablet 3  . fluticasone (FLONASE) 50 MCG/ACT nasal spray Place 1 spray into both nostrils daily. 16 g 2  . HYDROcodone-acetaminophen (NORCO/VICODIN) 5-325 MG tablet Take 1 tablet by mouth every 6 (six) hours as needed for moderate pain. 60 tablet 0  . lidocaine-prilocaine (EMLA) cream Apply 1 application topically as needed. 30 g 6  . omeprazole (PRILOSEC) 20 MG capsule Take 1 capsule (20 mg total) by mouth daily. 30 capsule 2  . ondansetron (ZOFRAN ODT) 4 MG disintegrating tablet Take 1 tablet (4 mg total) by mouth every 8 (eight) hours as needed for nausea or vomiting. 30 tablet 0  . promethazine  (PHENERGAN) 25 MG tablet Take 1 tablet (25 mg total) by mouth every 6 (six) hours as needed for nausea or vomiting. 30 tablet 2  . nitroGLYCERIN (NITROSTAT) 0.4 MG SL tablet Place 1 tablet (0.4 mg total) under the tongue every 5 (five) minutes as needed for chest pain. (Patient not taking: Reported on 09/10/2019) 25 tablet 2   No current facility-administered medications for this visit.   Facility-Administered Medications Ordered in Other Visits  Medication Dose Route Frequency Provider Last Rate Last Admin  . sodium chloride flush (NS) 0.9 % injection 10 mL  10 mL Intravenous PRN Volanda Napoleon, MD   10 mL at 01/03/18 1316  . sodium chloride flush (NS) 0.9 % injection 10 mL  10 mL Intravenous PRN Volanda Napoleon, MD   10 mL at 09/10/19 1114    Allergies  Allergen Reactions  . Tape Itching    Ok with adhesive and paper tape     Review of Systems:  Constitutional: Denies fever, chills, diaphoresis, appetite change and fatigue.  HEENT: Denies photophobia, eye pain, redness, hearing loss, ear pain, congestion, sore throat, rhinorrhea, sneezing, mouth sores, neck pain, neck stiffness and tinnitus.   Respiratory: Denies SOB, DOE, cough, chest tightness,  and wheezing.   Cardiovascular: Had palpitations.  Negative extensive cardiac work-up. Genitourinary: Denies dysuria, urgency, frequency, hematuria, flank pain and difficulty urinating.  Musculoskeletal: Denies myalgias, back pain, joint swelling, arthralgias and gait problem.  Skin: No rash.  Neurological: Denies dizziness, seizures, syncope, weakness, light-headedness, numbness and headaches.  Hematological: Denies adenopathy. Easy bruising, personal or family bleeding history  Psychiatric/Behavioral: has anxiety or depression     Physical Exam:    BP 110/72   Pulse 79   Ht 5\' 2"  (1.575 m)   Wt 109 lb 4 oz (49.6 kg)   LMP 02/28/2016 (Exact Date)   BMI 19.98 kg/m  Wt Readings from Last 3 Encounters:  10/25/19 109 lb 4 oz (49.6  kg)  10/22/19 111 lb 3.2 oz (50.4 kg)  10/17/19 106 lb (48.1 kg)   Constitutional: Had cachexia.  In no acute distress. Psychiatric: Normal mood and affect. Behavior is normal. HEENT: Pupils normal.  Conjunctivae are normal. No scleral icterus. Neck supple.  Cardiovascular: Normal rate, regular rhythm. No edema Pulmonary/chest: Effort normal and breath sounds normal. No wheezing, rales or rhonchi. Abdominal: Soft, nondistended. Nontender. Bowel sounds active throughout. There are no masses palpable. No hepatomegaly.  Well-healed surgical scars. Rectal:  defered Neurological: Alert and oriented to person place and time. Skin: Skin is warm and dry. No rashes noted.  Data Reviewed: I have personally reviewed following labs and imaging studies  CBC: CBC Latest Ref Rng & Units 10/17/2019 09/27/2019 09/10/2019  WBC 4.0 - 10.5 K/uL 4.8 4.1 4.2  Hemoglobin 12.0 - 15.0 g/dL 10.7(L) 10.2(L) 9.6(L)  Hematocrit 36 - 46 % 33.8(L) 32.0(L) 31.0(L)  Platelets 150 - 400 K/uL 187 172 198    CMP: CMP Latest Ref Rng & Units 10/17/2019 09/27/2019 09/10/2019  Glucose 70 - 99 mg/dL 80 91 129(H)  BUN 6 - 20 mg/dL 12 10 11   Creatinine 0.44 - 1.00 mg/dL 0.87 0.84 0.84  Sodium 135 - 145 mmol/L 136 139 137  Potassium 3.5 - 5.1 mmol/L 4.1 4.0 3.9  Chloride 98 - 111 mmol/L 102 107 104  CO2 22 - 32 mmol/L 29 25 28   Calcium 8.9 - 10.3 mg/dL 8.8(L) 7.7(L) 8.9  Total Protein 6.5 - 8.1 g/dL 6.5 5.8(L) 6.0(L)  Total Bilirubin 0.3 - 1.2 mg/dL 0.3 0.2(L) 0.4  Alkaline Phos 38 - 126 U/L 52 47 47  AST 15 - 41 U/L 24 28 16   ALT 0 - 44 U/L 22 17 13    CT scan was reviewed with the patient independently as well.   Carmell Austria, MD 10/25/2019, 11:21 AM  Cc: No ref. provider found

## 2019-11-05 ENCOUNTER — Telehealth: Payer: Self-pay | Admitting: Obstetrics & Gynecology

## 2019-11-05 NOTE — Telephone Encounter (Signed)
Patient is scheduled on 9/27 with Dr.Anyanwu for Hot Flashes and night sweats, she want to know if there is anything over the counter she cant take that wont harm her health

## 2019-11-06 ENCOUNTER — Telehealth: Payer: Self-pay

## 2019-11-06 NOTE — Telephone Encounter (Signed)
Called Pt to advise of OTC meds that she can take for relief of Menopause symptoms such as Hoy Register, which is sold at [employer], no answer, left VM.

## 2019-11-08 ENCOUNTER — Ambulatory Visit (HOSPITAL_COMMUNITY): Admission: RE | Admit: 2019-11-08 | Payer: Medicare Other | Source: Ambulatory Visit

## 2019-11-18 ENCOUNTER — Other Ambulatory Visit: Payer: Self-pay

## 2019-11-18 ENCOUNTER — Ambulatory Visit: Payer: Medicare Other | Admitting: Obstetrics & Gynecology

## 2019-11-18 DIAGNOSIS — C169 Malignant neoplasm of stomach, unspecified: Secondary | ICD-10-CM

## 2019-11-18 DIAGNOSIS — C163 Malignant neoplasm of pyloric antrum: Secondary | ICD-10-CM

## 2019-11-18 MED ORDER — HYDROCODONE-ACETAMINOPHEN 5-325 MG PO TABS: 1.0000 | ORAL_TABLET | Freq: Four times a day (QID) | ORAL | 0 refills | Status: DC | PRN

## 2019-11-19 ENCOUNTER — Ambulatory Visit: Payer: Medicare Other | Admitting: Cardiology

## 2019-11-21 ENCOUNTER — Ambulatory Visit (HOSPITAL_COMMUNITY): Admission: RE | Admit: 2019-11-21 | Payer: Medicare Other | Source: Ambulatory Visit

## 2019-12-03 ENCOUNTER — Ambulatory Visit: Payer: Medicare Other | Admitting: Gastroenterology

## 2019-12-09 HISTORY — PX: ESOPHAGOGASTRODUODENOSCOPY: SHX1529

## 2019-12-10 ENCOUNTER — Telehealth: Payer: Self-pay | Admitting: *Deleted

## 2019-12-10 ENCOUNTER — Inpatient Hospital Stay: Payer: Medicare Other | Admitting: Family

## 2019-12-10 ENCOUNTER — Inpatient Hospital Stay: Payer: Medicare Other

## 2019-12-10 NOTE — Telephone Encounter (Signed)
Call received from patient to inform Dr. Marin Olp that she is currently admitted at [facility] with dehydration and vomiting.  Informed pt that Dr. Marin Olp is out of the office today and that I would inform him of admission upon his return to the office.  Pt appreciative of assistance and has no further questions at this time.

## 2019-12-18 ENCOUNTER — Emergency Department (HOSPITAL_BASED_OUTPATIENT_CLINIC_OR_DEPARTMENT_OTHER)
Admission: EM | Admit: 2019-12-18 | Discharge: 2019-12-18 | Disposition: A | Discharge status: Home / Self Care | Payer: Medicare Other | Attending: Emergency Medicine | Admitting: Emergency Medicine

## 2019-12-18 ENCOUNTER — Other Ambulatory Visit: Payer: Self-pay | Admitting: General Surgery

## 2019-12-18 ENCOUNTER — Other Ambulatory Visit: Payer: Self-pay

## 2019-12-18 ENCOUNTER — Encounter (HOSPITAL_BASED_OUTPATIENT_CLINIC_OR_DEPARTMENT_OTHER): Payer: Self-pay

## 2019-12-18 DIAGNOSIS — Z85028 Personal history of other malignant neoplasm of stomach: Secondary | ICD-10-CM | POA: Insufficient documentation

## 2019-12-18 DIAGNOSIS — I10 Essential (primary) hypertension: Secondary | ICD-10-CM | POA: Diagnosis not present

## 2019-12-18 DIAGNOSIS — Z79899 Other long term (current) drug therapy: Secondary | ICD-10-CM | POA: Insufficient documentation

## 2019-12-18 DIAGNOSIS — K861 Other chronic pancreatitis: Secondary | ICD-10-CM | POA: Diagnosis not present

## 2019-12-18 DIAGNOSIS — R1115 Cyclical vomiting syndrome unrelated to migraine: Secondary | ICD-10-CM | POA: Diagnosis present

## 2019-12-18 DIAGNOSIS — Z87891 Personal history of nicotine dependence: Secondary | ICD-10-CM | POA: Insufficient documentation

## 2019-12-18 DIAGNOSIS — R110 Nausea: Secondary | ICD-10-CM

## 2019-12-18 LAB — COMPREHENSIVE METABOLIC PANEL
ALT: 19 U/L (ref 0–44)
AST: 23 U/L (ref 15–41)
Albumin: 4.3 g/dL (ref 3.5–5.0)
Alkaline Phosphatase: 53 U/L (ref 38–126)
Anion gap: 10 (ref 5–15)
BUN: 11 mg/dL (ref 6–20)
CO2: 27 mmol/L (ref 22–32)
Calcium: 8.8 mg/dL — ABNORMAL LOW (ref 8.9–10.3)
Chloride: 102 mmol/L (ref 98–111)
Creatinine, Ser: 0.68 mg/dL (ref 0.44–1.00)
GFR, Estimated: >60 mL/min (ref >60)
Glucose, Bld: 125 mg/dL — ABNORMAL HIGH (ref 70–99)
Potassium: 3.5 mmol/L (ref 3.5–5.1)
Sodium: 139 mmol/L (ref 135–145)
Total Bilirubin: 0.8 mg/dL (ref 0.3–1.2)
Total Protein: 7.2 g/dL (ref 6.5–8.1)

## 2019-12-18 LAB — CBC WITH DIFFERENTIAL/PLATELET
Abs Immature Granulocytes: 0.01 10*3/uL (ref 0.00–0.07)
Basophils Absolute: 0 10*3/uL (ref 0.0–0.1)
Basophils Relative: 0 %
Eosinophils Absolute: 0 10*3/uL (ref 0.0–0.5)
Eosinophils Relative: 0 %
HCT: 33.5 % — ABNORMAL LOW (ref 36.0–46.0)
Hemoglobin: 10.9 g/dL — ABNORMAL LOW (ref 12.0–15.0)
Immature Granulocytes: 0 %
Lymphocytes Relative: 16 %
Lymphs Abs: 1.2 10*3/uL (ref 0.7–4.0)
MCH: 28.6 pg (ref 26.0–34.0)
MCHC: 32.5 g/dL (ref 30.0–36.0)
MCV: 87.9 fL (ref 80.0–100.0)
Monocytes Absolute: 0.5 10*3/uL (ref 0.1–1.0)
Monocytes Relative: 7 %
Neutro Abs: 5.5 10*3/uL (ref 1.7–7.7)
Neutrophils Relative %: 77 %
Platelets: 246 10*3/uL (ref 150–400)
RBC: 3.81 MIL/uL — ABNORMAL LOW (ref 3.87–5.11)
RDW: 13 % (ref 11.5–15.5)
WBC: 7.3 10*3/uL (ref 4.0–10.5)
nRBC: 0 % (ref 0.0–0.2)

## 2019-12-18 LAB — TROPONIN I (HIGH SENSITIVITY): Troponin I (High Sensitivity): 12 ng/L (ref <18)

## 2019-12-18 LAB — LIPASE, BLOOD: Lipase: 17 U/L (ref 11–51)

## 2019-12-18 MED ORDER — PROMETHAZINE HCL 25 MG RE SUPP
25.0000 mg | Freq: Three times a day (TID) | RECTAL | 0 refills | Status: DC | PRN
Start: 2019-12-18 — End: 2021-03-12

## 2019-12-18 MED ORDER — KETOROLAC TROMETHAMINE 30 MG/ML IJ SOLN
30.0000 mg | Freq: Once | INTRAMUSCULAR | Status: AC
  Administered 2019-12-18: 30 mg via INTRAVENOUS
  Filled 2019-12-18: qty 1

## 2019-12-18 MED ORDER — METOCLOPRAMIDE HCL 5 MG/ML IJ SOLN
10.0000 mg | INTRAMUSCULAR | Status: AC
  Administered 2019-12-18: 10 mg via INTRAVENOUS
  Filled 2019-12-18: qty 2

## 2019-12-18 MED ORDER — HEPARIN SOD (PORK) LOCK FLUSH 100 UNIT/ML IV SOLN
500.0000 [IU] | Freq: Once | INTRAVENOUS | Status: AC
  Administered 2019-12-18: 500 [IU]
  Filled 2019-12-18: qty 5

## 2019-12-18 MED ORDER — HALOPERIDOL LACTATE 5 MG/ML IJ SOLN
5.0000 mg | Freq: Once | INTRAMUSCULAR | Status: AC
  Administered 2019-12-18: 5 mg via INTRAVENOUS
  Filled 2019-12-18: qty 1

## 2019-12-18 MED ORDER — FENTANYL CITRATE (PF) 100 MCG/2ML IJ SOLN
100.0000 ug | Freq: Once | INTRAMUSCULAR | Status: AC
  Administered 2019-12-18: 100 ug via INTRAVENOUS
  Filled 2019-12-18: qty 2

## 2019-12-18 NOTE — ED Triage Notes (Signed)
Pt arrives with c/o vomiting starting today pt states that she was recently admitted to [facility] for pancreatitis, hx of stomach cancer.

## 2019-12-18 NOTE — ED Provider Notes (Signed)
[facility] EMERGENCY DEPARTMENT Provider Note   CSN: [id number] Arrival date & time: 12/18/19  1855     History Chief Complaint  Patient presents with  . Emesis    Paula [name] is a 54 y.o. female.  54 year old female with extensive past medical history below including cyclical vomiting, stomach cancer, migraines who presents with abdominal pain and vomiting. PT was admitted to [facility] 10/24 and discharged yesterday for vomiting and abd pain thought to be acute on chronic pancreatitis. She states that this afternoon her symptoms flared up again and since then she has had significant vomiting, upper abdominal pain that radiates into her chest into her back. She states the pain is exactly like previous episodes. No fevers or urinary symptoms. She tried Phenergan at home without relief.  The history is provided by the patient.  Emesis      Past Medical History:  Diagnosis Date  . Anxiety    per pt  . Arthritis    back  . Costochondritis   . Esophageal reflux    On omeprazole  . Family history of cancer   . Headache(784.0) 2011   Migraines  . Heart murmur    never caused any problems  . History of hiatal hernia   . History of radiation therapy 05/12/14- 06/17/14   post-op gastric region/ nodes/ 45 Gy in 25 fractions.   . Hypertension    started around 2011  . Iron deficiency anemia due to chronic blood loss 05/25/2015  . 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 On antineoplastic chemotherapy    5-FU and leucovorin  . Ovarian cyst   . Pneumonia    x 1  . Pulmonary emboli ([place]) 2015  . Stomach cancer ([place]) dx'd 08/2013  . Tuberculosis    ? [id number]  . Wears partial dentures    upper dentures    Patient Active Problem List   Diagnosis Date Noted  . Right leg pain 10/15/2018  . Concussion with no loss of consciousness 09/10/2018  . Muscle pain 09/10/2018  . S/P vaginal hysterectomy 11/09/2017  . Left knee pain 04/25/2017  . Iron deficiency anemia due to  chronic blood loss 05/25/2015  . Healed or old pulmonary embolism 03/31/2015  . Genetic testing 01/23/2015  . Insomnia 06/30/2014  . Neutropenia ([place]) 02/24/2014  . Neuropathy due to chemotherapeutic drug ([place]) 12/18/2013  . Hypoalbuminemia 12/18/2013  . Malfunction of device 12/18/2013  . Leg edema 12/18/2013  . Neoplasm related pain 12/18/2013  . Other pancytopenia ([place]) 12/13/2013  . Hyponatremia 12/12/2013  . Hypokalemia 12/12/2013  . Weight loss 12/04/2013  . Abnormal loss of weight 12/04/2013  . Family history of cancer   . Cancer of antrum of stomach ([place]) 11/07/2013  . 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 Palliative care encounter 11/04/2013  . Acute kidney injury ([place]) 11/03/2013  . Chronic low back pain 10/31/2013  . 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 Constipation, chronic 10/28/2013  . Protein-calorie malnutrition, severe ([place]) 10/23/2013  . Intractable nausea and vomiting 10/22/2013  . Intractable vomiting 10/22/2013  . 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 Carcinoma of stomach ([place]) 09/18/2013  . Antral ulcer 09/16/2013  . Migraine headache 09/14/2013  . PTSD (post-traumatic stress disorder) 08/19/2013  . Left knee injury 07/30/2013  . Mitral regurgitation 02/15/2013  . Chest pain 01/23/2012  . Essential hypertension 01/23/2012  . Murmur 01/23/2012    Past Surgical History:  Procedure Laterality  Date  . ABDOMINAL HYSTERECTOMY    . CESAREAN SECTION     x 2  . ESOPHAGOGASTRODUODENOSCOPY N/A 09/16/2013   Procedure: ESOPHAGOGASTRODUODENOSCOPY (EGD);  Surgeon: Wonda Horner, MD;  Location: [facility];  Service: Endoscopy;  Laterality: N/A;  . GASTROSTOMY     r/t stomach cancer  . IR CV LINE INJECTION  08/30/2017  . IR FLUORO GUIDE PORT INSERTION RIGHT  03/21/2017  . IR US GUIDE VASC ACCESS RIGHT  03/21/2017  . LAPAROSCOPY N/A 09/20/2013   Procedure: DIAGNOSTIC  LAPAROSCOPY,DISTAL GASTRECTOMY AND FEEDING GASTROJEJUNOSTOMY;  Surgeon: [name], MD;  Location: [facility];  Service: General;  Laterality: N/A;  . OOPHORECTOMY     Around 1985 left ovary removal  . PORTACATH PLACEMENT Left 11/18/2013   Procedure: INSERTION PORT-A-CATH;  Surgeon: [name], MD;  Location: [facility];  Service: General;  Laterality: Left;  . TONSILLECTOMY     Around 1994  . TUBAL LIGATION    . UNILATERAL SALPINGECTOMY Left 11/09/2017   Procedure: UNILATERAL SALPINGECTOMY;  Surgeon: Osborne Oman, MD;  Location: [facility];  Service: Gynecology;  Laterality: Left;  . UPPER GASTROINTESTINAL ENDOSCOPY    . VAGINAL HYSTERECTOMY Left 11/09/2017   Procedure: HYSTERECTOMY VAGINAL WITH LEFT SALPINGECTOMY;  Surgeon: Osborne Oman, MD;  Location: [facility];  Service: Gynecology;  Laterality: Left;     OB History    Gravida  2   Para  2   Term  1   Preterm  1   AB      Living  2     SAB      TAB      Ectopic      Multiple      Live Births  2           Family History  Problem Relation Age of Onset  . Hypertension Mother   . Diabetes Mellitus II Mother   . Other Mother        hx of hysterectomy for unspecified reason  . Hypertension Sister   . Hypertension Brother   . Heart failure Maternal Grandmother   . Heart Problems Maternal Grandmother   . Hypertension Maternal Grandfather   . Diabetes Maternal Grandfather   . Colon cancer Paternal Uncle        dx. late 50s-early 60s  . Gastric cancer Cousin 42  . Colon cancer Cousin        dx. early-late 31s; (father also had colon cancer)  . Colon cancer Paternal Uncle        dx. late 50s-early 60s  . Lung cancer Paternal Uncle        dx. 72s; smoker  . Colon polyps Paternal Uncle        unknown number  . Heart attack Maternal Aunt   . Heart Problems Paternal Aunt   . Heart attack Paternal Grandfather   . Colon cancer Paternal Grandfather        dx. 50s-60s  . Colon polyps Brother        1 maternal  half-brother had approx 3 polyps  . Breast cancer Cousin 60  . Breast cancer Cousin        dx. early 37s or earlier (father had colon cancer)  . Lung cancer Other        PGF's brothers (x4); dx. later in life; lim info  . Esophageal cancer Neg Hx   . Rectal cancer Neg Hx     Social History   Tobacco Use  .  Smoking status: Former Smoker    Packs/day: 1.00    Years: 16.00    Pack years: 16.00    Types: Cigarettes    Quit date: 02/21/1998    Years since quitting: 21.8  . Smokeless tobacco: Never Used  . Tobacco comment: 0.5-1.0 ppd for 16 yrs  Vaping Use  . Vaping Use: Never used  Substance Use Topics  . Alcohol use: Yes    Alcohol/week: 0.0 standard drinks    Comment: occasional wine  . Drug use: Not Currently    Types: Marijuana    Comment: 03/04/2010    Home Medications Prior to Admission medications   Medication Sig Start Date End Date Taking? Authorizing Provider  carvedilol (COREG) 6.25 MG tablet Take 0.5 tablets (3.125 mg total) by mouth 2 (two) times daily. 01/28/19   Lelon Perla, MD  fluticasone (FLONASE) 50 MCG/ACT nasal spray Place 1 spray into both nostrils daily. 12/10/18   Khatri, Hina, PA-C  HYDROcodone-acetaminophen (NORCO/VICODIN) 5-325 MG tablet Take 1 tablet by mouth every 6 (six) hours as needed for moderate pain. 11/18/19   Volanda Napoleon, MD  lidocaine-prilocaine (EMLA) cream Apply 1 application topically as needed. 02/05/19   Volanda Napoleon, MD  nitroGLYCERIN (NITROSTAT) 0.4 MG SL tablet Place 1 tablet (0.4 mg total) under the tongue every 5 (five) minutes as needed for chest pain. Patient not taking: Reported on 09/10/2019 12/20/18 05/15/19  Lendon Colonel, NP  omeprazole (PRILOSEC) 20 MG capsule Take 1 capsule (20 mg total) by mouth 2 (two) times daily before a meal. 10/25/19   Jackquline Denmark, MD  ondansetron (ZOFRAN ODT) 4 MG disintegrating tablet Take 1 tablet (4 mg total) by mouth every 8 (eight) hours as needed for nausea or vomiting. 03/14/19    Jackquline Denmark, MD  promethazine (PHENERGAN) 25 MG suppository Place 1 suppository (25 mg total) rectally every 8 (eight) hours as needed for nausea or vomiting. 12/18/19   Marylon Verno, Wenda Overland, MD  promethazine (PHENERGAN) 25 MG tablet Take 1 tablet (25 mg total) by mouth every 6 (six) hours as needed for nausea or vomiting. 09/10/19   [place], Holli Humbles, NP    Allergies    Tape  Review of Systems   Review of Systems  Gastrointestinal: Positive for vomiting.   All other systems reviewed and are negative except that which was mentioned in HPI  Physical Exam Updated Vital Signs BP (!) 150/88   Pulse 80   Temp 98.6 F (37 C) (Oral)   Resp 19   Ht 5\' 2"  (1.575 m)   Wt 48.1 kg   LMP 02/28/2016 (Exact Date)   SpO2 100%   BMI 19.39 kg/m   Physical Exam Vitals and nursing note reviewed.  Constitutional:      General: She is not in acute distress.    Appearance: She is well-developed. She is ill-appearing.     Comments: Thin, chronically ill appearing, sitting up in bed rocking back and forth and repetitively slapping chest with hand  HENT:     Head: Normocephalic and atraumatic.  Eyes:     Conjunctiva/sclera: Conjunctivae normal.  Cardiovascular:     Rate and Rhythm: Normal rate and regular rhythm.     Heart sounds: Normal heart sounds. No murmur heard.   Pulmonary:     Effort: Pulmonary effort is normal.     Breath sounds: Normal breath sounds.  Abdominal:     General: There is no distension.     Palpations: Abdomen is soft.  Comments: Difficult to assess due to patient non-cooperative, +epigastric, RUQ , LUQ tenderness  Musculoskeletal:     Cervical back: Neck supple.  Skin:    General: Skin is warm and dry.     Comments: Port in R upper chest  Neurological:     Mental Status: She is alert and oriented to person, place, and time.     Comments: Fluent speech  Psychiatric:     Comments: Anxious, distressed     ED Results / Procedures / Treatments    Labs (all labs ordered are listed, but only abnormal results are displayed) Labs Reviewed  COMPREHENSIVE METABOLIC PANEL - Abnormal; Notable for the following components:      Result Value   Glucose, Bld 125 (*)    Calcium 8.8 (*)    All other components within normal limits  CBC WITH DIFFERENTIAL/PLATELET - Abnormal; Notable for the following components:   RBC 3.81 (*)    Hemoglobin 10.9 (*)    HCT 33.5 (*)    All other components within normal limits  LIPASE, BLOOD  TROPONIN I (HIGH SENSITIVITY)    EKG None  Radiology No results found.  Procedures Procedures (including critical care time)  Medications Ordered in ED Medications  metoCLOPramide (REGLAN) injection 10 mg (10 mg Intravenous Given 12/18/19 1922)  haloperidol lactate (HALDOL) injection 5 mg (5 mg Intravenous Given 12/18/19 2033)  fentaNYL (SUBLIMAZE) injection 100 mcg (100 mcg Intravenous Given 12/18/19 1956)  ketorolac (TORADOL) 30 MG/ML injection 30 mg (30 mg Intravenous Given 12/18/19 2211)  heparin lock flush 100 unit/mL (500 Units Intracatheter Given 12/18/19 2249)    ED Course  I have reviewed the triage vital signs and the nursing notes.  Pertinent labs & imaging results that were available during my care of the patient were reviewed by me and considered in my medical decision making (see chart for details).    MDM Rules/Calculators/A&P                          PT distressed and anxious on exam, hypertensive. Immediately asked for Dilaudid for her symptoms but I declined. Labs show normal CMP, normal lipase, normal WBC count,  Hgb 10.9. trop negative and EKG without ischemic changes. Gave above medications. I reviewed 2 recent hospitalizations for similar sx. Had recent EGD, follows w/ GI, and has upcoming appt w/ gen surgery.   On reassessment, patient resting comfortably with nausea improved. Discussed supportive measures, counseled on marijuana cessation, discussed follow-up with GI and reviewed  return precautions. Final Clinical Impression(s) / ED Diagnoses Final diagnoses:  Cyclical vomiting  Chronic pancreatitis, unspecified pancreatitis type ([place])    Rx / DC Orders ED Discharge Orders         Ordered    promethazine (PHENERGAN) 25 MG suppository  Every 8 hours PRN        12/18/19 2243           Rosaria Kubin, Wenda Overland, MD 12/18/19 [phone]

## 2019-12-18 NOTE — ED Notes (Signed)
ED Provider at bedside. 

## 2019-12-18 NOTE — ED Notes (Signed)
Discharge instructions discussed with patient. Medications and follow up care reviewed. Departs ED at this time with family in wheel chair.

## 2019-12-18 NOTE — ED Notes (Signed)
Pt placed on 2L [place]. Poor read on pulse ox. Saturation increased.

## 2019-12-20 ENCOUNTER — Ambulatory Visit (INDEPENDENT_AMBULATORY_CARE_PROVIDER_SITE_OTHER): Payer: Medicare Other | Admitting: Gastroenterology

## 2019-12-20 ENCOUNTER — Other Ambulatory Visit: Payer: Self-pay | Admitting: *Deleted

## 2019-12-20 ENCOUNTER — Encounter: Payer: Self-pay | Admitting: Gastroenterology

## 2019-12-20 VITALS — BP 150/100 | HR 99 | Ht 61.0 in | Wt 102.5 lb

## 2019-12-20 DIAGNOSIS — D509 Iron deficiency anemia, unspecified: Secondary | ICD-10-CM | POA: Diagnosis not present

## 2019-12-20 DIAGNOSIS — R112 Nausea with vomiting, unspecified: Secondary | ICD-10-CM

## 2019-12-20 DIAGNOSIS — D131 Benign neoplasm of stomach: Secondary | ICD-10-CM | POA: Diagnosis not present

## 2019-12-20 DIAGNOSIS — C169 Malignant neoplasm of stomach, unspecified: Secondary | ICD-10-CM

## 2019-12-20 DIAGNOSIS — R110 Nausea: Secondary | ICD-10-CM

## 2019-12-20 DIAGNOSIS — C163 Malignant neoplasm of pyloric antrum: Secondary | ICD-10-CM

## 2019-12-20 MED ORDER — PROMETHAZINE HCL 25 MG PO TABS: 25.0000 mg | ORAL_TABLET | Freq: Four times a day (QID) | ORAL | 2 refills | Status: DC | PRN

## 2019-12-20 MED ORDER — HYDROCODONE-ACETAMINOPHEN 5-325 MG PO TABS: 1.0000 | ORAL_TABLET | Freq: Four times a day (QID) | ORAL | 0 refills | Status: DC | PRN

## 2019-12-20 MED ORDER — OMEPRAZOLE 20 MG PO CPDR
20.0000 mg | DELAYED_RELEASE_CAPSULE | Freq: Two times a day (BID) | ORAL | 11 refills | Status: DC
Start: 2019-12-20 — End: 2020-12-30

## 2019-12-20 MED FILL — OMEPRAZOLE 20 MG CAP: 20 | 30 days supply | Qty: 60 | Fill #0

## 2019-12-20 NOTE — Patient Instructions (Signed)
If you are age 53 or older, your body mass index should be between 23-30. Your Body mass index is 19.37 kg/m. If this is out of the aforementioned range listed, please consider follow up with your Primary Care Provider.  If you are age 68 or younger, your body mass index should be between 19-25. Your Body mass index is 19.37 kg/m. If this is out of the aformentioned range listed, please consider follow up with your Primary Care Provider.   We have sent the following medications to your pharmacy for you to pick up at your convenience: Omeprazole 20 mg 1 tablet twice a day  1. Please use Ensure 1 can twice a day, 2.Stop using Marijuana, use Marinol prescription for now. I would prefer to wean her off that too, if possible. Taking a hot shower as needed helps. 3. Small and more frequent meals 6-7 times daily until you speak with dietary. 4. IV iron per Dr Marin Olp. 5. May continue to use phenergan

## 2019-12-20 NOTE — Progress Notes (Signed)
Chief Complaint: N/V/wt loss  Referring Provider:  No ref. provider found      ASSESSMENT AND PLAN;   #1. N/V/wt loss with failure to thrive- Neg CT AP 12/19/2018 for recurrence of gastric cancer.  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 Nl TSH. EGD Jan 2021 with 2 cm Barrett's esophagus, small gastric remnant with angular takeoff of afferent/efferent limbs.  #2. Stage IIA (T1a, N2, M0) gastric AdenoCa s/p BII gastrectomy/feeding J tube July 2015 followed by neoadjuvant chemoradiation (Dr Barry Dienes). Neg CT AP 12/19/2018. EGD 01/2018, 02/2019 - as below. Neg for recurrence.  #3. IDA d/t Billroth II gastrectomy.  #4. Chronic constipation.  Refuses screening colon.  Plan: -UGI series to delineate anatomy. Nov 3 -She has FU appt with Dr. Barry Dienes for ? consideration of revision of BII or convert to Roux-en-Y. -Omeprazole 20 po BID, 60, 11 refills  -Ensure 1 can BID. -Quit smoking. -Stop marijauana use.  -I have encouraged her to eat small but more frequent meals 6-7 times/day.   -IV iron per Dr Marin Olp (Hb 10.7 @baseline ) -Can continue to use phenergan prn as before. -Minimize narcotics. -Discussed above in detail with the patient and patient's son.   HPI:    Paula [name] is a 54 y.o. female  With H/O stage II gastric AdenoCa s/p Billroth II gastrectomy 2015 followed by neoadjuvant chemoradiation.  Has been having persistent abdominal pain, intermittent N/V ever since. Neg CT AP as below 11/2018. EGD 02/2019 showed small gastric remnant with moderate gastritis (neg Bx), angular takeoff of the efferent limb.  2 cm Barrett's esophagus without dysplasia.  Most recently admitted to [facility] hospital with upper GI bleeding.  EGD 12/09/2019 by Dr. Kirby Crigler was negative except chronic findings.  She was managed conservatively.  CT Abdo/pelvis in addition to above findings did show pancreatic atrophy.  A trial of pancreatic enzymes was  tried without any improvement.  She denies having any diarrhea.  Continues to have nausea/vomiting with chronic abdominal pain.  She has seen Dr. Barry Dienes in the interim.  She is being considered for revision of B2.  She did not come for upper GI series appointment.  It was rescheduled for November 3.  She has been tolerating omeprazole 20 mg p.o. once a day well.  It does help.  She has not recently tried it taking twice a day.  Continues to smoke.  Lost 7 pounds as below.  Has problems with constipation with hard BMs 2-3/week.  Would like to hold off on colonoscopy.   Wt Readings from Last 3 Encounters:  12/20/19 102 lb 8 oz (46.5 kg)  12/18/19 106 lb (48.1 kg)  10/25/19 109 lb 4 oz (49.6 kg)     Previous GI procedures:  Underwent CT Abdo/pelvis on 12/19/2018 which did not show any acute abnormalities.  No recurrence.  EGD 03/06/2019 - Moderate gastritis. (Likely etiology of recurrent anemia). - Likely 2 cm Barrett's esophagus (biopsied) - Patent Billroth II gastrojejunostomy with normal afferent/efferent limbs. Did have angular takeoff. -No recurrence of gastric cancer. -Bx: Eso: Barrett's esophagus without dysplasia. Gastric: Gastritis with negative HP.  EGD 02/07/2018 [facility] -Salmon-colored mucosa extending from 37 to 38 cm. -Billroth II gastrectomy.  Angular takeoff of both limbs. -Gastritis (Bx- neg) -Single 3 mm gastric polyp at anastomosis (Bx-hyperplastic polyp) -Small amount of food residue in stomach.  Bilious gastric fluid. -Dilated lacteals in the duodenum (Bx- neg, neg for celiac) -No recurrence.  CTA chest/Abdo/pelvis 02/23/2019 1. No acute thoracic, abdominal or  pelvic pathology. Specifically, no evidence for aortic dissection or aneurysm. 2. Chronic findings as above including Port-A-Cath, postsurgical changes   Past Medical History:  Diagnosis Date  . Anxiety    per pt  . Arthritis    back  . Barrett esophagus   . Costochondritis   . Esophageal  reflux    On omeprazole  . Family history of cancer   . Headache(784.0) 2011   Migraines  . Heart murmur    never caused any problems  . History of hiatal hernia   . History of radiation therapy 05/12/14- 06/17/14   post-op gastric region/ nodes/ 45 Gy in 25 fractions.   . Hypertension    started around 2011  . Iron deficiency anemia due to chronic blood loss 05/25/2015  . 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 On antineoplastic chemotherapy    5-FU and leucovorin  . Ovarian cyst   . Pneumonia    x 1  . Pulmonary emboli ([place]) 2015  . Stomach cancer ([place]) dx'd 08/2013  . Tuberculosis    ? [id number]  . Wears partial dentures    upper dentures    Past Surgical History:  Procedure Laterality Date  . ABDOMINAL HYSTERECTOMY    . CESAREAN SECTION     x 2  . ESOPHAGOGASTRODUODENOSCOPY N/A 09/16/2013   Procedure: ESOPHAGOGASTRODUODENOSCOPY (EGD);  Surgeon: Wonda Horner, MD;  Location: [facility];  Service: Endoscopy;  Laterality: N/A;  . ESOPHAGOGASTRODUODENOSCOPY  12/09/2019   [place]     r/t stomach cancer  . IR CV LINE INJECTION  08/30/2017  . IR FLUORO GUIDE PORT INSERTION RIGHT  03/21/2017  . IR US GUIDE VASC ACCESS RIGHT  03/21/2017  . LAPAROSCOPY N/A 09/20/2013   Procedure: DIAGNOSTIC LAPAROSCOPY,DISTAL GASTRECTOMY AND FEEDING GASTROJEJUNOSTOMY;  Surgeon: [name], MD;  Location: [facility];  Service: General;  Laterality: N/A;  . OOPHORECTOMY     Around 1985 left ovary removal  . PORTACATH PLACEMENT Left 11/18/2013   Procedure: INSERTION PORT-A-CATH;  Surgeon: [name], MD;  Location: [facility];  Service: General;  Laterality: Left;  . TONSILLECTOMY     Around 1994  . TUBAL LIGATION    . UNILATERAL SALPINGECTOMY Left 11/09/2017   Procedure: UNILATERAL SALPINGECTOMY;  Surgeon: Osborne Oman, MD;  Location: [facility];  Service: Gynecology;  Laterality: Left;  . UPPER GASTROINTESTINAL ENDOSCOPY    . VAGINAL HYSTERECTOMY Left 11/09/2017   Procedure: HYSTERECTOMY  VAGINAL WITH LEFT SALPINGECTOMY;  Surgeon: Osborne Oman, MD;  Location: [facility] [facility];  Service: Gynecology;  Laterality: Left;    Family History  Problem Relation Age of Onset  . Hypertension Mother   . Diabetes Mellitus II Mother   . Other Mother        hx of hysterectomy for unspecified reason  . Hypertension Sister   . Hypertension Brother   . Heart failure Maternal Grandmother   . Heart Problems Maternal Grandmother   . Hypertension Maternal Grandfather   . Diabetes Maternal Grandfather   . Colon cancer Paternal Uncle        dx. late 50s-early 60s  . Gastric cancer Cousin 84  . Colon cancer Cousin        dx. early-late 52s; (father also had colon cancer)  . Colon cancer Paternal Uncle        dx. late 50s-early 60s  . Lung cancer Paternal Uncle        dx. 73s; smoker  . Colon polyps Paternal Uncle  unknown number  . Heart attack Maternal Aunt   . Heart Problems Paternal Aunt   . Heart attack Paternal Grandfather   . Colon cancer Paternal Grandfather        dx. 50s-60s  . Colon polyps Brother        1 maternal half-brother had approx 3 polyps  . Breast cancer Cousin 35  . Breast cancer Cousin        dx. early 72s or earlier (father had colon cancer)  . Lung cancer Other        PGF's brothers (x4); dx. later in life; lim info  . Esophageal cancer Neg Hx   . Rectal cancer Neg Hx     Social History   Tobacco Use  . Smoking status: Former Smoker    Packs/day: 1.00    Years: 16.00    Pack years: 16.00    Types: Cigarettes    Quit date: 02/21/1998    Years since quitting: 21.8  . Smokeless tobacco: Never Used  . Tobacco comment: 0.5-1.0 ppd for 16 yrs  Vaping Use  . Vaping Use: Never used  Substance Use Topics  . Alcohol use: Not Currently    Alcohol/week: 0.0 standard drinks    Comment: occasional wine  . Drug use: Not Currently    Types: Marijuana    Comment: 03/04/2010    Current Outpatient Medications  Medication Sig Dispense Refill  . carvedilol  (COREG) 6.25 MG tablet Take 0.5 tablets (3.125 mg total) by mouth 2 (two) times daily. 180 tablet 3  . CREON 24000-76000 units CPEP Take 1 capsule by mouth 3 (three) times daily.    Marland Kitchen HYDROcodone-acetaminophen (NORCO/VICODIN) 5-325 MG tablet Take 1 tablet by mouth every 6 (six) hours as needed for moderate pain. 60 tablet 0  . lidocaine-prilocaine (EMLA) cream Apply 1 application topically as needed. 30 g 6  . omeprazole (PRILOSEC) 20 MG capsule Take 1 capsule (20 mg total) by mouth 2 (two) times daily before a meal. 60 capsule 2  . ondansetron (ZOFRAN ODT) 4 MG disintegrating tablet Take 1 tablet (4 mg total) by mouth every 8 (eight) hours as needed for nausea or vomiting. 30 tablet 0  . promethazine (PHENERGAN) 25 MG suppository Place 1 suppository (25 mg total) rectally every 8 (eight) hours as needed for nausea or vomiting. 6 each 0  . promethazine (PHENERGAN) 25 MG tablet Take 1 tablet (25 mg total) by mouth every 6 (six) hours as needed for nausea or vomiting. 30 tablet 2  . fluticasone (FLONASE) 50 MCG/ACT nasal spray Place 1 spray into both nostrils daily. (Patient not taking: Reported on 12/20/2019) 16 g 2  . nitroGLYCERIN (NITROSTAT) 0.4 MG SL tablet Place 1 tablet (0.4 mg total) under the tongue every 5 (five) minutes as needed for chest pain. (Patient not taking: Reported on 09/10/2019) 25 tablet 2   No current facility-administered medications for this visit.   Facility-Administered Medications Ordered in Other Visits  Medication Dose Route Frequency Provider Last Rate Last Admin  . sodium chloride flush (NS) 0.9 % injection 10 mL  10 mL Intravenous PRN Volanda Napoleon, MD   10 mL at 01/03/18 1316  . sodium chloride flush (NS) 0.9 % injection 10 mL  10 mL Intravenous PRN Volanda Napoleon, MD   10 mL at 09/10/19 1114    Allergies  Allergen Reactions  . Tape Itching    Ok with adhesive and paper tape     Review of Systems:  neg  Physical Exam:    BP (!) 150/100   Pulse  99   Ht 5' 1"  (1.549 m)   Wt 102 lb 8 oz (46.5 kg)   LMP 02/28/2016 (Exact Date)   BMI 19.37 kg/m  Wt Readings from Last 3 Encounters:  12/20/19 102 lb 8 oz (46.5 kg)  12/18/19 106 lb (48.1 kg)  10/25/19 109 lb 4 oz (49.6 kg)   Constitutional: Had cachexia.  In no acute distress. Psychiatric: Normal mood and affect. Behavior is normal. HEENT: Pupils normal.  Conjunctivae are normal. No scleral icterus.  Cardiovascular: Normal rate, regular rhythm. No edema Pulmonary/chest: Effort normal and breath sounds normal. No wheezing, rales or rhonchi. Abdominal: Soft, nondistended. Nontender. Bowel sounds active throughout. There are no masses palpable. No hepatomegaly.  Well-healed surgical scars. Neurological: Alert and oriented to person place and time. Skin: Skin is warm and dry. No rashes noted.  Data Reviewed: I have personally reviewed following labs and imaging studies  CBC: CBC Latest Ref Rng & Units 12/18/2019 10/17/2019 09/27/2019  WBC 4.0 - 10.5 K/uL 7.3 4.8 4.1  Hemoglobin 12.0 - 15.0 g/dL 10.9(L) 10.7(L) 10.2(L)  Hematocrit 36 - 46 % 33.5(L) 33.8(L) 32.0(L)  Platelets 150 - 400 K/uL 246 187 172    CMP: CMP Latest Ref Rng & Units 12/18/2019 10/17/2019 09/27/2019  Glucose 70 - 99 mg/dL 125(H) 80 91  BUN 6 - 20 mg/dL 11 12 10   Creatinine 0.44 - 1.00 mg/dL 0.68 0.87 0.84  Sodium 135 - 145 mmol/L 139 136 139  Potassium 3.5 - 5.1 mmol/L 3.5 4.1 4.0  Chloride 98 - 111 mmol/L 102 102 107  CO2 22 - 32 mmol/L 27 29 25   Calcium 8.9 - 10.3 mg/dL 8.8(L) 8.8(L) 7.7(L)  Total Protein 6.5 - 8.1 g/dL 7.2 6.5 5.8(L)  Total Bilirubin 0.3 - 1.2 mg/dL 0.8 0.3 0.2(L)  Alkaline Phos 38 - 126 U/L 53 52 47  AST 15 - 41 U/L 23 24 28   ALT 0 - 44 U/L 19 22 17    Hepatic Function Latest Ref Rng & Units 12/18/2019 10/17/2019 09/27/2019  Total Protein 6.5 - 8.1 g/dL 7.2 6.5 5.8(L)  Albumin 3.5 - 5.0 g/dL 4.3 3.7 3.3(L)  AST 15 - 41 U/L 23 24 28   ALT 0 - 44 U/L 19 22 17   Alk Phosphatase 38 - 126 U/L 53  52 47  Total Bilirubin 0.3 - 1.2 mg/dL 0.8 0.3 0.2(L)     Carmell Austria, MD 12/20/2019, 11:21 AM  Cc: No ref. provider found

## 2019-12-25 ENCOUNTER — Ambulatory Visit
Admission: RE | Admit: 2019-12-25 | Discharge: 2019-12-25 | Disposition: A | Discharge status: Home / Self Care | Payer: Medicare Other | Source: Ambulatory Visit | Attending: General Surgery | Admitting: General Surgery

## 2019-12-25 DIAGNOSIS — R110 Nausea: Secondary | ICD-10-CM

## 2020-01-01 ENCOUNTER — Ambulatory Visit: Payer: Medicare Other | Admitting: Obstetrics & Gynecology

## 2020-01-10 NOTE — Progress Notes (Deleted)
HPI: FU chest pain.Patient seen Paula [name] 6/18 with chest pain and palpitations. Monitor June 2018 showed sinus to sinus tachycardia with rare PVC and brief run of SVT. Echocardiogram June 2018 showed normal LV function and mild mitral regurgitation.Nuclear study November 2020 showed ejection fraction 53% with no ischemia or infarction. Since last seen,   Current Outpatient Medications  Medication Sig Dispense Refill  . carvedilol (COREG) 6.25 MG tablet Take 0.5 tablets (3.125 mg total) by mouth 2 (two) times daily. 180 tablet 3  . CREON 24000-76000 units CPEP Take 1 capsule by mouth 3 (three) times daily.    . fluticasone (FLONASE) 50 MCG/ACT nasal spray Place 1 spray into both nostrils daily. (Patient not taking: Reported on 12/20/2019) 16 g 2  . HYDROcodone-acetaminophen (NORCO/VICODIN) 5-325 MG tablet Take 1 tablet by mouth every 6 (six) hours as needed for moderate pain. 60 tablet 0  . lidocaine-prilocaine (EMLA) cream Apply 1 application topically as needed. 30 g 6  . nitroGLYCERIN (NITROSTAT) 0.4 MG SL tablet Place 1 tablet (0.4 mg total) under the tongue every 5 (five) minutes as needed for chest pain. (Patient not taking: Reported on 09/10/2019) 25 tablet 2  . omeprazole (PRILOSEC) 20 MG capsule Take 1 capsule (20 mg total) by mouth 2 (two) times daily before a meal. 60 capsule 11  . ondansetron (ZOFRAN ODT) 4 MG disintegrating tablet Take 1 tablet (4 mg total) by mouth every 8 (eight) hours as needed for nausea or vomiting. 30 tablet 0  . promethazine (PHENERGAN) 25 MG suppository Place 1 suppository (25 mg total) rectally every 8 (eight) hours as needed for nausea or vomiting. 6 each 0  . promethazine (PHENERGAN) 25 MG tablet Take 1 tablet (25 mg total) by mouth every 6 (six) hours as needed for nausea or vomiting. 30 tablet 2   No current facility-administered medications for this visit.   Facility-Administered Medications Ordered in Other Visits  Medication Dose Route  Frequency Provider Last Rate Last Admin  . sodium chloride flush (NS) 0.9 % injection 10 mL  10 mL Intravenous PRN Volanda Napoleon, MD   10 mL at 01/03/18 1316  . sodium chloride flush (NS) 0.9 % injection 10 mL  10 mL Intravenous PRN Volanda Napoleon, MD   10 mL at 09/10/19 1114     Past Medical History:  Diagnosis Date  . Anxiety    per pt  . Arthritis    back  . Barrett esophagus   . Costochondritis   . Esophageal reflux    On omeprazole  . Family history of cancer   . Headache(784.0) 2011   Migraines  . Heart murmur    never caused any problems  . History of hiatal hernia   . History of radiation therapy 05/12/14- 06/17/14   post-op gastric region/ nodes/ 45 Gy in 25 fractions.   . Hypertension    started around 2011  . Iron deficiency anemia due to chronic blood loss 05/25/2015  . 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 On antineoplastic chemotherapy    5-FU and leucovorin  . Ovarian cyst   . Pneumonia    x 1  . Pulmonary emboli ([place]) 2015  . Stomach cancer ([place]) dx'd 08/2013  . Tuberculosis    ? [id number]  . Wears partial dentures    upper dentures    Past Surgical History:  Procedure Laterality Date  . ABDOMINAL HYSTERECTOMY    . CESAREAN SECTION     x 2  .  ESOPHAGOGASTRODUODENOSCOPY N/A 09/16/2013   Procedure: ESOPHAGOGASTRODUODENOSCOPY (EGD);  Surgeon: Wonda Horner, MD;  Location: [facility];  Service: Endoscopy;  Laterality: N/A;  . ESOPHAGOGASTRODUODENOSCOPY  12/09/2019   [place]     r/t stomach cancer  . IR CV LINE INJECTION  08/30/2017  . IR FLUORO GUIDE PORT INSERTION RIGHT  03/21/2017  . IR US GUIDE VASC ACCESS RIGHT  03/21/2017  . LAPAROSCOPY N/A 09/20/2013   Procedure: DIAGNOSTIC LAPAROSCOPY,DISTAL GASTRECTOMY AND FEEDING GASTROJEJUNOSTOMY;  Surgeon: [name], MD;  Location: [facility];  Service: General;  Laterality: N/A;  . OOPHORECTOMY     Around 1985 left ovary removal  . PORTACATH PLACEMENT Left 11/18/2013   Procedure: INSERTION  PORT-A-CATH;  Surgeon: [name], MD;  Location: [facility];  Service: General;  Laterality: Left;  . TONSILLECTOMY     Around 1994  . TUBAL LIGATION    . UNILATERAL SALPINGECTOMY Left 11/09/2017   Procedure: UNILATERAL SALPINGECTOMY;  Surgeon: Osborne Oman, MD;  Location: [facility];  Service: Gynecology;  Laterality: Left;  . UPPER GASTROINTESTINAL ENDOSCOPY    . VAGINAL HYSTERECTOMY Left 11/09/2017   Procedure: HYSTERECTOMY VAGINAL WITH LEFT SALPINGECTOMY;  Surgeon: Osborne Oman, MD;  Location: [facility];  Service: Gynecology;  Laterality: Left;    Social History   Socioeconomic History  . Marital status: Legally Separated    Spouse name: Cornelia Copa  . Number of children: 2  . Years of education: Not on file  . Highest education level: Not on file  Occupational History    Employer: [employer]  Tobacco Use  . Smoking status: Former Smoker    Packs/day: 1.00    Years: 16.00    Pack years: 16.00    Types: Cigarettes    Quit date: 02/21/1998    Years since quitting: 21.8  . Smokeless tobacco: Never Used  . Tobacco comment: 0.5-1.0 ppd for 16 yrs  Vaping Use  . Vaping Use: Never used  Substance and Sexual Activity  . Alcohol use: Not Currently    Alcohol/week: 0.0 standard drinks    Comment: occasional wine  . Drug use: Not Currently    Types: Marijuana    Comment: 03/04/2010  . Sexual activity: Not Currently    Birth control/protection: Surgical    Comment: Depo Inj 09/2017  Other Topics Concern  . Not on file  Social History Narrative  . Not on file   Social Determinants of Health   Financial Resource Strain:   . Difficulty of Paying Living Expenses: Not on file  Food Insecurity:   . Worried About Charity fundraiser in the Last Year: Not on file  . Ran Out of Food in the Last Year: Not on file  Transportation Needs:   . Lack of Transportation (Medical): Not on file  . Lack of Transportation (Non-Medical): Not on file  Physical Activity:   . Days of  Exercise per Week: Not on file  . Minutes of Exercise per Session: Not on file  Stress:   . Feeling of Stress : Not on file  Social Connections:   . Frequency of Communication with Friends and Family: Not on file  . Frequency of Social Gatherings with Friends and Family: Not on file  . Attends Religious Services: Not on file  . Active Member of Clubs or Organizations: Not on file  . Attends Archivist Meetings: Not on file  . Marital Status: Not on file  Intimate Partner Violence:   .  Fear of Current or Ex-Partner: Not on file  . Emotionally Abused: Not on file  . Physically Abused: Not on file  . Sexually Abused: Not on file    Family History  Problem Relation Age of Onset  . Hypertension Mother   . Diabetes Mellitus II Mother   . Other Mother        hx of hysterectomy for unspecified reason  . Hypertension Sister   . Hypertension Brother   . Heart failure Maternal Grandmother   . Heart Problems Maternal Grandmother   . Hypertension Maternal Grandfather   . Diabetes Maternal Grandfather   . Colon cancer Paternal Uncle        dx. late 50s-early 60s  . Gastric cancer Cousin 32  . Colon cancer Cousin        dx. early-late 107s; (father also had colon cancer)  . Colon cancer Paternal Uncle        dx. late 50s-early 60s  . Lung cancer Paternal Uncle        dx. 34s; smoker  . Colon polyps Paternal Uncle        unknown number  . Heart attack Maternal Aunt   . Heart Problems Paternal Aunt   . Heart attack Paternal Grandfather   . Colon cancer Paternal Grandfather        dx. 50s-60s  . Colon polyps Brother        1 maternal half-brother had approx 3 polyps  . Breast cancer Cousin 53  . Breast cancer Cousin        dx. early 19s or earlier (father had colon cancer)  . Lung cancer Other        PGF's brothers (x4); dx. later in life; lim info  . Esophageal cancer Neg Hx   . Rectal cancer Neg Hx     ROS: no fevers or chills, productive cough, hemoptysis,  dysphasia, odynophagia, melena, hematochezia, dysuria, hematuria, rash, seizure activity, orthopnea, PND, pedal edema, claudication. Remaining systems are negative.  Physical Exam: Well-developed well-nourished in no acute distress.  Skin is warm and dry.  HEENT is normal.  Neck is supple.  Chest is clear to auscultation with normal expansion.  Cardiovascular exam is regular rate and rhythm.  Abdominal exam nontender or distended. No masses palpated. Extremities show no edema. neuro grossly intact  ECG- personally reviewed  A/P  1 history of atypical chest pain-patient denies recurrent symptoms.  Nuclear study showed no ischemia.  2 hypertension-blood pressure controlled.  Continue present medications and follow.  3 palpitations-continue beta-blocker at present dose.  4 mitral regurgitation-mild on most recent echo.  Kirk Ruths, MD

## 2020-01-20 ENCOUNTER — Other Ambulatory Visit: Payer: Self-pay | Admitting: *Deleted

## 2020-01-20 DIAGNOSIS — C169 Malignant neoplasm of stomach, unspecified: Secondary | ICD-10-CM

## 2020-01-20 DIAGNOSIS — C163 Malignant neoplasm of pyloric antrum: Secondary | ICD-10-CM

## 2020-01-20 MED ORDER — HYDROCODONE-ACETAMINOPHEN 5-325 MG PO TABS: 1.0000 | ORAL_TABLET | Freq: Four times a day (QID) | ORAL | 0 refills | Status: DC | PRN

## 2020-01-22 ENCOUNTER — Ambulatory Visit: Payer: Medicare Other | Admitting: Cardiology

## 2020-01-23 ENCOUNTER — Ambulatory Visit: Payer: Medicare Other | Admitting: Obstetrics & Gynecology

## 2020-01-29 LAB — COLOGUARD: COLOGUARD: NEGATIVE

## 2020-01-30 ENCOUNTER — Telehealth: Payer: Self-pay

## 2020-01-30 NOTE — Telephone Encounter (Signed)
Spoke with patient and scheduled an in-person Palliative Consult for 02/13/20 @ 12PM   COVID screening was negative. No pets in home. Patient lives with son at this time.   Consent obtained; updated Outlook/Netsmart/Team List and Epic.

## 2020-02-13 ENCOUNTER — Other Ambulatory Visit: Payer: Medicare Other | Admitting: Internal Medicine

## 2020-02-13 ENCOUNTER — Other Ambulatory Visit: Payer: Self-pay

## 2020-02-17 ENCOUNTER — Ambulatory Visit (INDEPENDENT_AMBULATORY_CARE_PROVIDER_SITE_OTHER): Payer: Medicare Other | Admitting: Obstetrics & Gynecology

## 2020-02-17 ENCOUNTER — Other Ambulatory Visit: Payer: Self-pay | Admitting: Hematology & Oncology

## 2020-02-17 ENCOUNTER — Other Ambulatory Visit: Payer: Self-pay

## 2020-02-17 ENCOUNTER — Other Ambulatory Visit (HOSPITAL_COMMUNITY)
Admission: RE | Admit: 2020-02-17 | Discharge: 2020-02-17 | Disposition: A | Discharge status: Home / Self Care | Payer: Medicare Other | Source: Ambulatory Visit | Attending: Obstetrics & Gynecology | Admitting: Obstetrics & Gynecology

## 2020-02-17 ENCOUNTER — Encounter: Payer: Self-pay | Admitting: Obstetrics & Gynecology

## 2020-02-17 ENCOUNTER — Other Ambulatory Visit: Payer: Self-pay | Admitting: *Deleted

## 2020-02-17 VITALS — BP 113/76 | HR 71 | Ht 62.0 in | Wt 99.0 lb

## 2020-02-17 DIAGNOSIS — N898 Other specified noninflammatory disorders of vagina: Secondary | ICD-10-CM | POA: Diagnosis present

## 2020-02-17 DIAGNOSIS — R110 Nausea: Secondary | ICD-10-CM

## 2020-02-17 DIAGNOSIS — C163 Malignant neoplasm of pyloric antrum: Secondary | ICD-10-CM

## 2020-02-17 DIAGNOSIS — N951 Menopausal and female climacteric states: Secondary | ICD-10-CM | POA: Diagnosis not present

## 2020-02-17 DIAGNOSIS — B379 Candidiasis, unspecified: Secondary | ICD-10-CM | POA: Diagnosis not present

## 2020-02-17 DIAGNOSIS — T451X5A Adverse effect of antineoplastic and immunosuppressive drugs, initial encounter: Secondary | ICD-10-CM

## 2020-02-17 DIAGNOSIS — R112 Nausea with vomiting, unspecified: Secondary | ICD-10-CM

## 2020-02-17 DIAGNOSIS — C169 Malignant neoplasm of stomach, unspecified: Secondary | ICD-10-CM

## 2020-02-17 MED ORDER — HYDROCODONE-ACETAMINOPHEN 5-325 MG PO TABS: 1.0000 | ORAL_TABLET | Freq: Four times a day (QID) | ORAL | 0 refills | Status: DC | PRN

## 2020-02-17 MED ORDER — PROMETHAZINE HCL 25 MG PO TABS: 25.0000 mg | ORAL_TABLET | Freq: Four times a day (QID) | ORAL | 2 refills | Status: DC | PRN

## 2020-02-17 MED ORDER — GABAPENTIN 600 MG PO TABS: 600.0000 mg | ORAL_TABLET | Freq: Every day | ORAL | 3 refills | Status: DC

## 2020-02-17 MED FILL — HYDROCODON-APAP 5-325: 5-325 | 15 days supply | Qty: 60 | Fill #0

## 2020-02-17 MED FILL — PROMETHAZINE 25 MG TABLET: 25 | 7 days supply | Qty: 30 | Fill #0

## 2020-02-17 NOTE — Patient Instructions (Signed)
Vaginitis Vaginitis is a condition in which the vaginal tissue swells and becomes red (inflamed). This condition is most often caused by a change in the normal balance of bacteria and yeast that live in the vagina. This change causes an overgrowth of certain bacteria or yeast, which causes the inflammation. There are different types of vaginitis, but the most common types are:  Bacterial vaginosis.  Yeast infection (candidiasis).  Trichomoniasis vaginitis. This is a sexually transmitted disease (STD).  Viral vaginitis.  Atrophic vaginitis.  Allergic vaginitis. What are the causes? The cause of this condition depends on the type of vaginitis. It can be caused by:  Bacteria (bacterial vaginosis).  Yeast, which is a fungus (yeast infection).  A parasite (trichomoniasis vaginitis).  A virus (viral vaginitis).  Low hormone levels (atrophic vaginitis). Low hormone levels can occur during pregnancy, breastfeeding, or after menopause.  Irritants, such as bubble baths, scented tampons, and feminine sprays (allergic vaginitis). Other factors can change the normal balance of the yeast and bacteria that live in the vagina. These include:  Antibiotic medicines.  Poor hygiene.  Diaphragms, vaginal sponges, spermicides, birth control pills, and intrauterine devices (IUD).  Sex.  Infection.  Uncontrolled diabetes.  A weakened defense (immune) system. What increases the risk? This condition is more likely to develop in women who:  Smoke.  Use vaginal douches, scented tampons, or scented sanitary pads.  Wear tight-fitting pants.  Wear thong underwear.  Use oral birth control pills or an IUD.  Have sex without a condom.  Have multiple sex partners.  Have an STD.  Frequently use the spermicide nonoxynol-9.  Eat lots of foods high in sugar.  Have uncontrolled diabetes.  Have low estrogen levels.  Have a weakened immune system from an immune disorder or medical  treatment.  Are pregnant or breastfeeding. What are the signs or symptoms? Symptoms vary depending on the cause of the vaginitis. Common symptoms include:  Abnormal vaginal discharge. ? The discharge is white, gray, or yellow with bacterial vaginosis. ? The discharge is thick, white, and cheesy with a yeast infection. ? The discharge is frothy and yellow or greenish with trichomoniasis.  A bad vaginal smell. The smell is fishy with bacterial vaginosis.  Vaginal itching, pain, or swelling.  Sex that is painful.  Pain or burning when urinating. Sometimes there are no symptoms. How is this diagnosed? This condition is diagnosed based on your symptoms and medical history. A physical exam, including a pelvic exam, will also be done. You may also have other tests, including:  Tests to determine the pH level (acidity or alkalinity) of your vagina.  A whiff test, to assess the odor that results when a sample of your vaginal discharge is mixed with a potassium hydroxide solution.  Tests of vaginal fluid. A sample will be examined under a microscope. How is this treated? Treatment varies depending on the type of vaginitis you have. Your treatment may include:  Antibiotic creams or pills to treat bacterial vaginosis and trichomoniasis.  Antifungal medicines, such as vaginal creams or suppositories, to treat a yeast infection.  Medicine to ease discomfort if you have viral vaginitis. Your sexual partner should also be treated.  Estrogen delivered in a cream, pill, suppository, or vaginal ring to treat atrophic vaginitis. If vaginal dryness occurs, lubricants and moisturizing creams may help. You may need to avoid scented soaps, sprays, or douches.  Stopping use of a product that is causing allergic vaginitis. Then using a vaginal cream to treat the symptoms. Follow   these instructions at home: Lifestyle  Keep your genital area clean and dry. Avoid soap, and only rinse the area with  water.  Do not douche or use tampons until your health care provider says it is okay to do so. Use sanitary pads, if needed.  Do not have sex until your health care provider approves. When you can return to sex, practice safe sex and use condoms.  Wipe from front to back. This avoids the spread of bacteria from the rectum to the vagina. General instructions  Take over-the-counter and prescription medicines only as told by your health care provider.  If you were prescribed an antibiotic medicine, take or use it as told by your health care provider. Do not stop taking or using the antibiotic even if you start to feel better.  Keep all follow-up visits as told by your health care provider. This is important. How is this prevented?  Use mild, non-scented products. Do not use things that can irritate the vagina, such as fabric softeners. Avoid the following products if they are scented: ? Feminine sprays. ? Detergents. ? Tampons. ? Feminine hygiene products. ? Soaps or bubble baths.  Let air reach your genital area. ? Wear cotton underwear to reduce moisture buildup. ? Avoid wearing underwear while you sleep. ? Avoid wearing tight pants and underwear or nylons without a cotton panel. ? Avoid wearing thong underwear.  Take off any wet clothing, such as bathing suits, as soon as possible.  Practice safe sex and use condoms. Contact a health care provider if:  You have abdominal pain.  You have a fever.  You have symptoms that last for more than 2-3 days. Get help right away if:  You have a fever and your symptoms suddenly get worse. Summary  Vaginitis is a condition in which the vaginal tissue becomes inflamed.This condition is most often caused by a change in the normal balance of bacteria and yeast that live in the vagina.  Treatment varies depending on the type of vaginitis you have.  Do not douche, use tampons , or have sex until your health care provider approves. When  you can return to sex, practice safe sex and use condoms. This information is not intended to replace advice given to you by your health care provider. Make sure you discuss any questions you have with your health care provider. Document Revised: 01/20/2017 Document Reviewed: 03/15/2016 Elsevier Patient Education  2020 Elsevier Inc.  

## 2020-02-17 NOTE — Progress Notes (Signed)
GYNECOLOGY OFFICE VISIT NOTE  History:   Paula [name] is a 54 y.o. [id number] here today for management of vasomotor symptoms of very bothersome night sweats and hot flashes. More bothered at night. PMH remarkable for gastric cancer, VTE, s/p TVH in 2019 for AUB.  Had similar complaint in 2017, was treated with Neurontin.  She desires to try this again.  Also reports epidose of vaginal odor last week, no discharge but she wants testing done.  She denies any abnormal vaginal bleeding, pelvic pain or other concerns.  Not currently sexually active.    Past Medical History:  Diagnosis Date  . Anxiety    per pt  . Arthritis    back  . Barrett esophagus   . Costochondritis   . Esophageal reflux    On omeprazole  . Family history of cancer   . Headache(784.0) 2011   Migraines  . Heart murmur    never caused any problems  . History of hiatal hernia   . History of radiation therapy 05/12/14- 06/17/14   post-op gastric region/ nodes/ 45 Gy in 25 fractions.   . Hypertension    started around 2011  . Iron deficiency anemia due to chronic blood loss 05/25/2015  . 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 On antineoplastic chemotherapy    5-FU and leucovorin  . Ovarian cyst   . Pneumonia    x 1  . Pulmonary emboli (HCC) 2015  . Stomach cancer (HCC) dx'd 08/2013  . Tuberculosis    ? [id number]  . Wears partial dentures    upper dentures    Past Surgical History:  Procedure Laterality Date  . CESAREAN SECTION     x 2  . ESOPHAGOGASTRODUODENOSCOPY N/A 09/16/2013   Procedure: ESOPHAGOGASTRODUODENOSCOPY (EGD);  Surgeon: Graylin Shiver, MD;  Location: [facility];  Service: Endoscopy;  Laterality: N/A;  . ESOPHAGOGASTRODUODENOSCOPY  12/09/2019   [facility]  . GASTROSTOMY     r/t stomach cancer  . IR CV LINE INJECTION  08/30/2017  . IR FLUORO GUIDE PORT INSERTION RIGHT  03/21/2017  . IR US GUIDE VASC ACCESS RIGHT  03/21/2017  . LAPAROSCOPY N/A 09/20/2013   Procedure: DIAGNOSTIC  LAPAROSCOPY,DISTAL GASTRECTOMY AND FEEDING GASTROJEJUNOSTOMY;  Surgeon: Almond Lint, MD;  Location: [facility];  Service: General;  Laterality: N/A;  . OOPHORECTOMY     Around 1985 left ovary removal  . PORTACATH PLACEMENT Left 11/18/2013   Procedure: INSERTION PORT-A-CATH;  Surgeon: Almond Lint, MD;  Location: [facility];  Service: General;  Laterality: Left;  . TONSILLECTOMY     Around 1994  . TUBAL LIGATION    . UNILATERAL SALPINGECTOMY Left 11/09/2017   Procedure: UNILATERAL SALPINGECTOMY;  Surgeon: Tereso Newcomer, MD;  Location: [facility];  Service: Gynecology;  Laterality: Left;  . UPPER GASTROINTESTINAL ENDOSCOPY    . VAGINAL HYSTERECTOMY Left 11/09/2017   Procedure: HYSTERECTOMY VAGINAL WITH LEFT SALPINGECTOMY;  Surgeon: Tereso Newcomer, MD;  Location: [facility];  Service: Gynecology;  Laterality: Left;   The following portions of the patient's history were reviewed and updated as appropriate: allergies, current medications, past family history, past medical history, past social history, past surgical history and problem list.   Review of Systems:  Pertinent items noted in HPI and remainder of comprehensive ROS otherwise negative.  Physical Exam:  BP 113/76   Pulse 71   Ht 5\' 2"  (1.575 m)   Wt 99 lb (44.9 kg)   LMP 02/28/2016 (Exact Date)   BMI 18.11 kg/m  CONSTITUTIONAL: Well-developed, well-nourished female in no acute distress.  HEENT:  Normocephalic, atraumatic. External right and left ear normal. No scleral icterus.  NECK: Normal range of motion, supple, no masses noted on observation SKIN: No rash noted. Not diaphoretic. No erythema. No pallor. MUSCULOSKELETAL: Normal range of motion. No edema noted. NEUROLOGIC: Alert and oriented to person, place, and time. Normal muscle tone coordination. No cranial nerve deficit noted. PSYCHIATRIC: Normal mood and affect. Normal behavior. Normal judgment and thought content. CARDIOVASCULAR: Normal heart rate noted RESPIRATORY: Effort and  breath sounds normal, no problems with respiration noted ABDOMEN: No masses noted. No other overt distention noted.   PELVIC: Deferred    Assessment and Plan:      1. Menopausal vasomotor syndrome Will do trial of Neurontin again, if this does not work, consider Effexor or Paxil.  - gabapentin (NEURONTIN) 600 MG tablet; Take 1 tablet (600 mg total) by mouth at bedtime.  Dispense: 30 tablet; Refill: 3  2. Vaginal odor Self-swab done, will follow up results and manage accordingly. Proper vulvar hygiene emphasized: discussed avoidance of perfumed soaps, detergents, lotions and any type of douches; in addition to wearing cotton underwear and no underwear at night.  Also recommended cleaning front to back, voiding and cleaning up after intercourse.  - Cervicovaginal ancillary only Routine preventative health maintenance measures emphasized. Please refer to After Visit Summary for other counseling recommendations.   Return in about 1 month (around 03/19/2020) for Virtual Follow Up.    I spent 18 minutes dedicated to the care of this patient including pre-visit review of records, face to face time with the patient discussing her conditions and treatments and post visit ordering of testing.    Jaynie Collins, MD, FACOG Obstetrician & Gynecologist, [facility] for [employer], [facility]

## 2020-02-18 ENCOUNTER — Inpatient Hospital Stay: Payer: Medicare Other

## 2020-02-18 ENCOUNTER — Inpatient Hospital Stay: Payer: Medicare Other | Attending: Hematology & Oncology

## 2020-02-18 ENCOUNTER — Other Ambulatory Visit: Payer: Self-pay

## 2020-02-18 ENCOUNTER — Telehealth: Payer: Self-pay | Admitting: Family

## 2020-02-18 ENCOUNTER — Inpatient Hospital Stay (HOSPITAL_BASED_OUTPATIENT_CLINIC_OR_DEPARTMENT_OTHER): Payer: Medicare Other | Admitting: Family

## 2020-02-18 ENCOUNTER — Encounter: Payer: Self-pay | Admitting: Family

## 2020-02-18 VITALS — BP 103/64 | HR 77 | Temp 98.7°F | Resp 18 | Ht 62.0 in | Wt 98.0 lb

## 2020-02-18 DIAGNOSIS — Z85028 Personal history of other malignant neoplasm of stomach: Secondary | ICD-10-CM | POA: Diagnosis not present

## 2020-02-18 DIAGNOSIS — C169 Malignant neoplasm of stomach, unspecified: Secondary | ICD-10-CM

## 2020-02-18 DIAGNOSIS — R634 Abnormal weight loss: Secondary | ICD-10-CM | POA: Insufficient documentation

## 2020-02-18 DIAGNOSIS — D500 Iron deficiency anemia secondary to blood loss (chronic): Secondary | ICD-10-CM

## 2020-02-18 DIAGNOSIS — R109 Unspecified abdominal pain: Secondary | ICD-10-CM | POA: Insufficient documentation

## 2020-02-18 DIAGNOSIS — R0782 Intercostal pain: Secondary | ICD-10-CM | POA: Diagnosis not present

## 2020-02-18 DIAGNOSIS — Z923 Personal history of irradiation: Secondary | ICD-10-CM | POA: Diagnosis not present

## 2020-02-18 DIAGNOSIS — Z9221 Personal history of antineoplastic chemotherapy: Secondary | ICD-10-CM | POA: Insufficient documentation

## 2020-02-18 DIAGNOSIS — D509 Iron deficiency anemia, unspecified: Secondary | ICD-10-CM | POA: Diagnosis not present

## 2020-02-18 LAB — CERVICOVAGINAL ANCILLARY ONLY
Bacterial Vaginitis (gardnerella): NEGATIVE
Candida Glabrata: POSITIVE — AB
Candida Vaginitis: NEGATIVE
Comment: NEGATIVE
Comment: NEGATIVE
Comment: NEGATIVE
Comment: NEGATIVE
Trichomonas: NEGATIVE

## 2020-02-18 LAB — CBC WITH DIFFERENTIAL (CANCER CENTER ONLY)
Abs Immature Granulocytes: 0.02 10*3/uL (ref 0.00–0.07)
Basophils Absolute: 0 10*3/uL (ref 0.0–0.1)
Basophils Relative: 1 %
Eosinophils Absolute: 0.1 10*3/uL (ref 0.0–0.5)
Eosinophils Relative: 3 %
HCT: 31.8 % — ABNORMAL LOW (ref 36.0–46.0)
Hemoglobin: 9.9 g/dL — ABNORMAL LOW (ref 12.0–15.0)
Immature Granulocytes: 1 %
Lymphocytes Relative: 45 %
Lymphs Abs: 1.6 10*3/uL (ref 0.7–4.0)
MCH: 28.1 pg (ref 26.0–34.0)
MCHC: 31.1 g/dL (ref 30.0–36.0)
MCV: 90.3 fL (ref 80.0–100.0)
Monocytes Absolute: 0.3 10*3/uL (ref 0.1–1.0)
Monocytes Relative: 10 %
Neutro Abs: 1.4 10*3/uL — ABNORMAL LOW (ref 1.7–7.7)
Neutrophils Relative %: 40 %
Platelet Count: 218 10*3/uL (ref 150–400)
RBC: 3.52 MIL/uL — ABNORMAL LOW (ref 3.87–5.11)
RDW: 14.4 % (ref 11.5–15.5)
WBC Count: 3.5 10*3/uL — ABNORMAL LOW (ref 4.0–10.5)
nRBC: 0 % (ref 0.0–0.2)

## 2020-02-18 LAB — CMP (CANCER CENTER ONLY)
ALT: 9 U/L (ref 0–44)
AST: 13 U/L — ABNORMAL LOW (ref 15–41)
Albumin: 3.7 g/dL (ref 3.5–5.0)
Alkaline Phosphatase: 46 U/L (ref 38–126)
Anion gap: 6 (ref 5–15)
BUN: 14 mg/dL (ref 6–20)
CO2: 30 mmol/L (ref 22–32)
Calcium: 9.5 mg/dL (ref 8.9–10.3)
Chloride: 103 mmol/L (ref 98–111)
Creatinine: 0.9 mg/dL (ref 0.44–1.00)
GFR, Estimated: >60 mL/min (ref >60)
Glucose, Bld: 135 mg/dL — ABNORMAL HIGH (ref 70–99)
Potassium: 3.8 mmol/L (ref 3.5–5.1)
Sodium: 139 mmol/L (ref 135–145)
Total Bilirubin: 0.6 mg/dL (ref 0.3–1.2)
Total Protein: 6 g/dL — ABNORMAL LOW (ref 6.5–8.1)

## 2020-02-18 LAB — RETICULOCYTES
Immature Retic Fract: 5.6 % (ref 2.3–15.9)
RBC.: 3.48 MIL/uL — ABNORMAL LOW (ref 3.87–5.11)
Retic Count, Absolute: 35.5 10*3/uL (ref 19.0–186.0)
Retic Ct Pct: 1 % (ref 0.4–3.1)

## 2020-02-18 MED ORDER — BORIC ACID CRYS: 600.0000 mg | CRYSTALS | Freq: Every day | 2 refills | Status: AC

## 2020-02-18 NOTE — Patient Instructions (Signed)

## 2020-02-18 NOTE — Telephone Encounter (Signed)
Called and spoke with patient regarding appointments scheduled she will get info from My Chart per 12/28

## 2020-02-18 NOTE — Progress Notes (Signed)
Hematology and Oncology Follow Up Visit  Paula [name]:[id number] 1966/02/16 54 y.o. 02/18/2020   Principle Diagnosis:  Stage IIA (T1a, N2, M0) gastric adenocarcinoma diagnosed in July of 2015 Intermittent iron deficiency anemia  Past Therapy: Status post diagnostic laparoscopy, distal gastrectomy with billroth II anastamosis and feeding gastrojejunostomy tube under the care of Dr. Barry Dienes on 09/20/2013 Adjuvant systemic chemotherapy with 5-FU 600 mg/M2 with leucovorin on days 1, 8, 15, 22, 29 and 36 every 8 weeks Adjuvant concurrent chemoradiation with Xeloda 650 mg/M2 by mouth twice a day for 5 days every week with radiation. The patient completed the course of radiotherapy but she declined to take Xeloda during her radiation  Current Therapy: Observation IV iron as indicated   Interim History:  [name] is here today for follow-up. She states that she is not doing well. She continues to lose weight. She is down another 8 lbs to 98 lbs at this time.  She is doing her best to eat but has no appetite. Her PCP has consulter palliative care for her to help her with ADL's, pain management and nutrition.  She has added Fairlife protein shakes as well as mass protein to her daily routine to help with weight gain.  She had a gastric emptying study done in November and will be following up with Dr. Barry Dienes to go over results. Cologuard test in November was negative.  She still has occasional episodes of n/v.  No fever, chills, cough, rash, dizziness, SOB, palpitations or changes in bowel or bladder habits.  She has not noted any blood loss. No abnormal bruising, no petechiae.  No swelling, tenderness, numbness or tingling in her extremities at this time. She notes occasional puffiness in her ankles.  Pedal pulses are 2+.  No falls or syncope.   ECOG Performance Status: 1 - Symptomatic but completely ambulatory  Medications:  Allergies as of 02/18/2020       Reactions   Tape Itching   Ok with adhesive and paper tape       Medication List       Accurate as of February 18, 2020  9:57 AM. If you have any questions, ask your nurse or doctor.        carvedilol 6.25 MG tablet Commonly known as: COREG Take 0.5 tablets (3.125 mg total) by mouth 2 (two) times daily.   Creon 24000-76000 units Cpep Generic drug: Pancrelipase (Lip-Prot-Amyl) Take 1 capsule by mouth 3 (three) times daily.   fluticasone 50 MCG/ACT nasal spray Commonly known as: FLONASE Place 1 spray into both nostrils daily.   gabapentin 600 MG tablet Commonly known as: NEURONTIN Take 1 tablet (600 mg total) by mouth at bedtime.   HYDROcodone-acetaminophen 5-325 MG tablet Commonly known as: NORCO/VICODIN Take 1 tablet by mouth every 6 (six) hours as needed for moderate pain.   lidocaine-prilocaine cream Commonly known as: EMLA Apply 1 application topically as needed.   nitroGLYCERIN 0.4 MG SL tablet Commonly known as: NITROSTAT Place 1 tablet (0.4 mg total) under the tongue every 5 (five) minutes as needed for chest pain.   omeprazole 20 MG capsule Commonly known as: PRILOSEC Take 1 capsule (20 mg total) by mouth 2 (two) times daily before a meal.   ondansetron 4 MG disintegrating tablet Commonly known as: Zofran ODT Take 1 tablet (4 mg total) by mouth every 8 (eight) hours as needed for nausea or vomiting.   promethazine 25 MG suppository Commonly known as: PHENERGAN Place 1 suppository (25 mg total) rectally  every 8 (eight) hours as needed for nausea or vomiting.   promethazine 25 MG tablet Commonly known as: PHENERGAN Take 1 tablet (25 mg total) by mouth every 6 (six) hours as needed for nausea or vomiting.   WOMENS MULTIVITAMIN PO Take by mouth.       Allergies:  Allergies  Allergen Reactions  . Tape Itching    Ok with adhesive and paper tape     Past Medical History, Surgical history, Social history, and Family History were reviewed and  updated.  Review of Systems: All other 10 point review of systems is negative.   Physical Exam:  vitals were not taken for this visit.   Wt Readings from Last 3 Encounters:  02/18/20 98 lb (44.5 kg)  02/17/20 99 lb (44.9 kg)  12/20/19 102 lb 8 oz (46.5 kg)    Ocular: Sclerae unicteric, pupils equal, round and reactive to light Ear-nose-throat: Oropharynx clear, dentition fair Lymphatic: No cervical or supraclavicular adenopathy Lungs no rales or rhonchi, good excursion bilaterally Heart regular rate and rhythm, no murmur appreciated Abd soft, generalized tenderness, positive bowel sounds MSK no focal spinal tenderness, no joint edema Neuro: non-focal, well-oriented, appropriate affect Breasts: Deferred   Lab Results  Component Value Date   WBC 3.5 (L) 02/18/2020   HGB 9.9 (L) 02/18/2020   HCT 31.8 (L) 02/18/2020   MCV 90.3 02/18/2020   PLT 218 02/18/2020   Lab Results  Component Value Date   FERRITIN 133 09/10/2019   IRON 55 09/10/2019   TIBC 186 (L) 09/10/2019   UIBC 132 09/10/2019   IRONPCTSAT 29 09/10/2019   Lab Results  Component Value Date   RETICCTPCT 1.0 02/18/2020   RBC 3.52 (L) 02/18/2020   No results found for: Ron Parker [facility] Lab Results  Component Value Date   IGGSERUM 1,273 05/21/2015   IGMSERUM 40 05/21/2015   No results found for: Marda Stalker, SPEI   Chemistry      Component Value Date/Time   NA 139 12/18/2019 1913   NA 140 01/26/2017 1002   NA 140 09/24/2015 0953   K 3.5 12/18/2019 1913   K 3.5 01/26/2017 1002   K 4.0 09/24/2015 0953   CL 102 12/18/2019 1913   CL 106 01/26/2017 1002   CO2 27 12/18/2019 1913   CO2 26 01/26/2017 1002   CO2 23 09/24/2015 0953   BUN 11 12/18/2019 1913   BUN 6 (L) 01/26/2017 1002   BUN 8.6 09/24/2015 0953   CREATININE 0.68 12/18/2019 1913   CREATININE 0.84 09/10/2019 1030   CREATININE 1.1 01/26/2017 1002   CREATININE 1.0  09/24/2015 0953      Component Value Date/Time   CALCIUM 8.8 (L) 12/18/2019 1913   CALCIUM 8.9 01/26/2017 1002   CALCIUM 9.6 09/24/2015 0953   ALKPHOS 53 12/18/2019 1913   ALKPHOS 68 01/26/2017 1002   ALKPHOS 99 09/24/2015 0953   AST 23 12/18/2019 1913   AST 16 09/10/2019 1030   AST 17 09/24/2015 0953   ALT 19 12/18/2019 1913   ALT 13 09/10/2019 1030   ALT 17 01/26/2017 1002   ALT 11 09/24/2015 0953   BILITOT 0.8 12/18/2019 1913   BILITOT 0.4 09/10/2019 1030   BILITOT 0.83 09/24/2015 0953       Impression and Plan: [name] is a pleasant54yo African American female with history of stage II gastric cancer and partial gastrectomy followed by adjuvant chemo and radiation. She continues to have weight loss and  abdominal discomfort.  We will get and CT of the abdomen and pelvis. She also mentioned mid sternal pain that radiates into her back so we will also CT her chest.  Iron studies are pending. We will replace if needed.  She felt weak on arrival with BP 103/64, HR 77. We gave her 1/2 liter of fluids and she is feeling much better.  Follow-up in 3 months which is subject to change depending on what CT scans reveal.  She was encouraged to contact our office with any questions or concerns.   Laverna Peace, NP 12/28/20219:57 AM

## 2020-02-18 NOTE — Addendum Note (Signed)
Addended by: Jaynie Collins A on: 02/18/2020 02:16 PM   Modules accepted: Orders

## 2020-02-19 LAB — IRON AND TIBC
Iron: 80 ug/dL (ref 41–142)
Saturation Ratios: 42 % (ref 21–57)
TIBC: 193 ug/dL — ABNORMAL LOW (ref 236–444)
UIBC: 112 ug/dL — ABNORMAL LOW (ref 120–384)

## 2020-02-19 LAB — FERRITIN: Ferritin: 225 ng/mL (ref 11–307)

## 2020-02-20 ENCOUNTER — Ambulatory Visit (HOSPITAL_BASED_OUTPATIENT_CLINIC_OR_DEPARTMENT_OTHER): Admission: RE | Admit: 2020-02-20 | Payer: Medicare Other | Source: Ambulatory Visit

## 2020-02-20 ENCOUNTER — Ambulatory Visit (HOSPITAL_BASED_OUTPATIENT_CLINIC_OR_DEPARTMENT_OTHER): Payer: Medicare Other

## 2020-02-20 ENCOUNTER — Telehealth: Payer: Self-pay | Admitting: *Deleted

## 2020-02-20 NOTE — Telephone Encounter (Signed)
Message received from patient wanting to know if she needs a blood or iron infusion after reviewing her most recent blood work results in [employer].  Call placed back to patient and patient notified per order of S. [place] NP that she does not need a blood or iron infusion at this time.  Pt appreciative of call back and has no further questions at this time.

## 2020-02-24 ENCOUNTER — Ambulatory Visit (HOSPITAL_BASED_OUTPATIENT_CLINIC_OR_DEPARTMENT_OTHER): Payer: Medicare Other

## 2020-03-02 ENCOUNTER — Ambulatory Visit (HOSPITAL_BASED_OUTPATIENT_CLINIC_OR_DEPARTMENT_OTHER): Admission: RE | Admit: 2020-03-02 | Payer: Medicare Other | Source: Ambulatory Visit

## 2020-03-02 ENCOUNTER — Inpatient Hospital Stay: Payer: Medicare Other

## 2020-03-09 ENCOUNTER — Other Ambulatory Visit: Payer: Self-pay | Admitting: Gastroenterology

## 2020-03-09 DIAGNOSIS — R112 Nausea with vomiting, unspecified: Secondary | ICD-10-CM

## 2020-03-09 DIAGNOSIS — D500 Iron deficiency anemia secondary to blood loss (chronic): Secondary | ICD-10-CM

## 2020-03-19 ENCOUNTER — Other Ambulatory Visit: Payer: Self-pay | Admitting: *Deleted

## 2020-03-19 DIAGNOSIS — C169 Malignant neoplasm of stomach, unspecified: Secondary | ICD-10-CM

## 2020-03-19 DIAGNOSIS — C163 Malignant neoplasm of pyloric antrum: Secondary | ICD-10-CM

## 2020-03-19 MED ORDER — HYDROCODONE-ACETAMINOPHEN 5-325 MG PO TABS: 1.0000 | ORAL_TABLET | Freq: Four times a day (QID) | ORAL | 0 refills | Status: DC | PRN

## 2020-03-25 ENCOUNTER — Telehealth (INDEPENDENT_AMBULATORY_CARE_PROVIDER_SITE_OTHER): Payer: Medicare Other | Admitting: Obstetrics & Gynecology

## 2020-03-25 ENCOUNTER — Encounter: Payer: Self-pay | Admitting: Obstetrics & Gynecology

## 2020-03-25 DIAGNOSIS — N951 Menopausal and female climacteric states: Secondary | ICD-10-CM | POA: Diagnosis not present

## 2020-03-25 MED ORDER — GABAPENTIN 300 MG PO CAPS: 300.0000 mg | ORAL_CAPSULE | Freq: Every day | ORAL | 1 refills | Status: DC

## 2020-03-25 NOTE — Patient Instructions (Addendum)
Patient with bothersome menopausal vasomotor symptoms.   Discussed lifestyle interventions such as wearing light clothing, remaining in cool environments, having fan/air conditioner in the room, avoiding hot beverages etc.    Discussed using estrogen hormone therapy and concerns about heart disease, pulmonary embolus/DVT, and breast cancer.  This is NOT an option given your history.  Also discussed other medical options such as Paxil, Effexor (both are antidepressants), Clonidine (used for hypertension), or Neurontin (used for nerve pain).    Research these options and let me know what you want to do.  Visit [email or web address].org for reseatch about these options and any other options. Send MyChart message for any questions/concerns.

## 2020-03-25 NOTE — Progress Notes (Signed)
GYNECOLOGY VIRTUAL VISIT ENCOUNTER NOTE  Provider location: [facility] [place] [facility]   I connected with Paula [name] on 03/25/20 at 10:35 AM EST by MyChart Video Encounter at home and verified that I am speaking with the correct person using two identifiers.   I discussed the limitations, risks, security and privacy concerns of performing an evaluation and management service virtually and the availability of in person appointments. I also discussed with the patient that there may be a patient responsible charge related to this service. The patient expressed understanding and agreed to proceed.   History:  Paula [name] is a 55 y.o. [id number] female being followed up today for management of very bothersome night sweats and hot flashes. PMH remarkable for gastric cancer, VTE, s/p TVH in 2019 for AUB. Was prescribed Neurontin 600 mg po qhs during last encounter, she reports she did not pick this up as she remembered it made her feel very drowsy last time she tried it in 2017. Wants to discuss other management options. She denies any abnormal vaginal discharge, bleeding, pelvic pain or other concerns.      Past Medical History:  Diagnosis Date  . Anxiety    per pt  . Arthritis    back  . Barrett esophagus   . Costochondritis   . Esophageal reflux    On omeprazole  . Family history of cancer   . Headache(784.0) 2011   Migraines  . Heart murmur    never caused any problems  . History of hiatal hernia   . History of radiation therapy 05/12/14- 06/17/14   post-op gastric region/ nodes/ 45 Gy in 25 fractions.   . Hypertension    started around 2011  . Iron deficiency anemia due to chronic blood loss 05/25/2015  . 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 On antineoplastic chemotherapy    5-FU and leucovorin  . Ovarian cyst   . Pneumonia    x 1  . Pulmonary emboli ([place]) 2015  . Stomach cancer ([place]) dx'd 08/2013  . Tuberculosis    ? [id number]  .  Wears partial dentures    upper dentures   Past Surgical History:  Procedure Laterality Date  . CESAREAN SECTION     x 2  . ESOPHAGOGASTRODUODENOSCOPY N/A 09/16/2013   Procedure: ESOPHAGOGASTRODUODENOSCOPY (EGD);  Surgeon: Wonda Horner, MD;  Location: [facility];  Service: Endoscopy;  Laterality: N/A;  . ESOPHAGOGASTRODUODENOSCOPY  12/09/2019   [place]     r/t stomach cancer  . IR CV LINE INJECTION  08/30/2017  . IR FLUORO GUIDE PORT INSERTION RIGHT  03/21/2017  . IR US GUIDE VASC ACCESS RIGHT  03/21/2017  . LAPAROSCOPY N/A 09/20/2013   Procedure: DIAGNOSTIC LAPAROSCOPY,DISTAL GASTRECTOMY AND FEEDING GASTROJEJUNOSTOMY;  Surgeon: [name], MD;  Location: [facility];  Service: General;  Laterality: N/A;  . OOPHORECTOMY     Around 1985 left ovary removal  . PORTACATH PLACEMENT Left 11/18/2013   Procedure: INSERTION PORT-A-CATH;  Surgeon: [name], MD;  Location: [facility];  Service: General;  Laterality: Left;  . TONSILLECTOMY     Around 1994  . TUBAL LIGATION    . UNILATERAL SALPINGECTOMY Left 11/09/2017   Procedure: UNILATERAL SALPINGECTOMY;  Surgeon: Osborne Oman, MD;  Location: [facility];  Service: Gynecology;  Laterality: Left;  . UPPER GASTROINTESTINAL ENDOSCOPY    . VAGINAL HYSTERECTOMY Left 11/09/2017   Procedure: HYSTERECTOMY VAGINAL WITH LEFT SALPINGECTOMY;  Surgeon: Osborne Oman, MD;  Location: [facility];  Service: Gynecology;  Laterality: Left;   The following portions of the patient's history were reviewed and updated as appropriate: allergies, current medications, past family history, past medical history, past social history, past surgical history and problem list.   Review of Systems:  Pertinent items noted in HPI and remainder of comprehensive ROS otherwise negative.  Physical Exam:   General:  Alert, oriented and cooperative. Patient appears to be in no acute distress.  Mental Status: Normal mood and affect. Normal behavior. Normal judgment and  thought content.   Respiratory: Normal respiratory effort, no problems with respiration noted  Rest of physical exam deferred due to type of encounter      Assessment and Plan:     1. Menopausal vasomotor syndrome Patient with bothersome menopausal vasomotor symptoms. Discussed lifestyle interventions such as wearing light clothing, remaining in cool environments, having fan/air conditioner in the room, avoiding hot beverages etc.  Discussed using hormone therapy and concerns about increased risk of heart disease, cerebrovascular disease, thromboembolic disease,  and breast cancer. This is not an option of her given her history of PE and stomach cancer.   Also discussed other medical options such as Paxil, Effexor, Clonidine,  or Neurontin.  Also referred her to www.menopause.org for further research.  She wants to avoid antidepressant therapy, wants to try Neurontin at a reduced dosage so I prescribed 300 mg dose.   She will be reevaluated in one month. - gabapentin (NEURONTIN) 300 MG capsule; Take 1 capsule (300 mg total) by mouth at bedtime.  Dispense: 30 capsule; Refill: 1  Return in about 1 month (around 04/22/2020) for Virtual Follow Up of menopausal symptom management.      I discussed the assessment and treatment plan with the patient. The patient was provided an opportunity to ask questions and all were answered. The patient agreed with the plan and demonstrated an understanding of the instructions.   The patient was advised to call back or seek an in-person evaluation/go to the ED if the symptoms worsen or if the condition fails to improve as anticipated.  I provided 20 minutes of face-to-face time during this encounter.   Verita Schneiders, MD [facility], [place]

## 2020-03-31 ENCOUNTER — Inpatient Hospital Stay: Payer: Medicare Other

## 2020-04-06 ENCOUNTER — Other Ambulatory Visit: Payer: Self-pay

## 2020-04-06 ENCOUNTER — Inpatient Hospital Stay: Payer: Medicare Other | Attending: Hematology & Oncology

## 2020-04-06 VITALS — BP 111/64 | HR 81 | Temp 99.0°F | Resp 18

## 2020-04-06 DIAGNOSIS — Z85028 Personal history of other malignant neoplasm of stomach: Secondary | ICD-10-CM | POA: Insufficient documentation

## 2020-04-06 DIAGNOSIS — Z923 Personal history of irradiation: Secondary | ICD-10-CM | POA: Diagnosis not present

## 2020-04-06 DIAGNOSIS — Z452 Encounter for adjustment and management of vascular access device: Secondary | ICD-10-CM | POA: Diagnosis not present

## 2020-04-06 DIAGNOSIS — D500 Iron deficiency anemia secondary to blood loss (chronic): Secondary | ICD-10-CM

## 2020-04-06 DIAGNOSIS — Z9221 Personal history of antineoplastic chemotherapy: Secondary | ICD-10-CM | POA: Insufficient documentation

## 2020-04-06 MED ORDER — HEPARIN SOD (PORK) LOCK FLUSH 100 UNIT/ML IV SOLN
250.0000 [IU] | Freq: Once | INTRAVENOUS | Status: DC | PRN
  Filled 2020-04-06: qty 5

## 2020-04-06 MED ORDER — SODIUM CHLORIDE 0.9% FLUSH
10.0000 mL | Freq: Once | INTRAVENOUS | Status: AC | PRN
  Administered 2020-04-06: 10 mL
  Filled 2020-04-06: qty 10

## 2020-04-06 MED ORDER — HEPARIN SOD (PORK) LOCK FLUSH 100 UNIT/ML IV SOLN
500.0000 [IU] | Freq: Once | INTRAVENOUS | Status: AC | PRN
  Administered 2020-04-06: 500 [IU]
  Filled 2020-04-06: qty 5

## 2020-04-06 NOTE — Patient Instructions (Signed)

## 2020-04-13 ENCOUNTER — Other Ambulatory Visit: Payer: Self-pay | Admitting: *Deleted

## 2020-04-13 DIAGNOSIS — T451X5A Adverse effect of antineoplastic and immunosuppressive drugs, initial encounter: Secondary | ICD-10-CM

## 2020-04-13 DIAGNOSIS — R110 Nausea: Secondary | ICD-10-CM

## 2020-04-13 DIAGNOSIS — R112 Nausea with vomiting, unspecified: Secondary | ICD-10-CM

## 2020-04-13 MED ORDER — PROMETHAZINE HCL 25 MG PO TABS: 25.0000 mg | ORAL_TABLET | Freq: Four times a day (QID) | ORAL | 2 refills | Status: DC | PRN

## 2020-04-13 MED FILL — PROMETHAZINE 25 MG TABLET: 25 | 7 days supply | Qty: 30 | Fill #1

## 2020-04-21 MED FILL — PROMETHAZINE 25 MG TABLET: 25 | 7 days supply | Qty: 30 | Fill #1

## 2020-04-23 ENCOUNTER — Other Ambulatory Visit: Payer: Self-pay | Admitting: Obstetrics & Gynecology

## 2020-04-23 DIAGNOSIS — N951 Menopausal and female climacteric states: Secondary | ICD-10-CM

## 2020-04-23 MED ORDER — GABAPENTIN 600 MG PO TABS: 600.0000 mg | ORAL_TABLET | Freq: Every day | ORAL | 5 refills | Status: DC

## 2020-04-24 ENCOUNTER — Other Ambulatory Visit: Payer: Self-pay | Admitting: *Deleted

## 2020-04-24 DIAGNOSIS — C169 Malignant neoplasm of stomach, unspecified: Secondary | ICD-10-CM

## 2020-04-24 DIAGNOSIS — C163 Malignant neoplasm of pyloric antrum: Secondary | ICD-10-CM

## 2020-04-24 MED ORDER — HYDROCODONE-ACETAMINOPHEN 5-325 MG PO TABS: 1.0000 | ORAL_TABLET | Freq: Four times a day (QID) | ORAL | 0 refills | Status: DC | PRN

## 2020-05-06 ENCOUNTER — Telehealth (INDEPENDENT_AMBULATORY_CARE_PROVIDER_SITE_OTHER): Payer: Medicare Other | Admitting: Obstetrics & Gynecology

## 2020-05-06 ENCOUNTER — Encounter: Payer: Self-pay | Admitting: Obstetrics & Gynecology

## 2020-05-06 ENCOUNTER — Other Ambulatory Visit: Payer: Self-pay

## 2020-05-06 DIAGNOSIS — E8941 Symptomatic postprocedural ovarian failure: Secondary | ICD-10-CM | POA: Diagnosis not present

## 2020-05-06 DIAGNOSIS — Z79899 Other long term (current) drug therapy: Secondary | ICD-10-CM | POA: Diagnosis not present

## 2020-05-06 DIAGNOSIS — Z90710 Acquired absence of both cervix and uterus: Secondary | ICD-10-CM

## 2020-05-06 DIAGNOSIS — N951 Menopausal and female climacteric states: Secondary | ICD-10-CM

## 2020-05-06 NOTE — Progress Notes (Addendum)
GYNECOLOGY VIRTUAL VISIT ENCOUNTER NOTE  Provider location: [facility] [place] [facility]   Patient location: Home  I connected with Paula [name] on 05/06/20 at  11:30 AM EDT by MyChart Video Encounter at home and verified that I am speaking with the correct person using two identifiers.   I discussed the limitations, risks, security and privacy concerns of performing an evaluation and management service virtually and the availability of in person appointments. I also discussed with the patient that there may be a patient responsible charge related to this service. The patient expressed understanding and agreed to proceed.   History:  Paula [name] is a 55 y.o. [id number] female being followed up today for treatment of bothersome hot flashes. PMH remarkable for gastric cancer, VTE, s/p TVH in 2019 for AUB.   During last visit, was prescribed Neurontin 300 mg po qhs, this was increased to 600 mg as per her preference since then. Reports having rare episodes now, very satisfied. She denies any abnormal vaginal discharge, bleeding, pelvic pain or other concerns.       Past Medical History:  Diagnosis Date  . Anxiety    per pt  . Arthritis    back  . Barrett esophagus   . Costochondritis   . Esophageal reflux    On omeprazole  . Family history of cancer   . Headache(784.0) 2011   Migraines  . Heart murmur    never caused any problems  . History of hiatal hernia   . History of radiation therapy 05/12/14- 06/17/14   post-op gastric region/ nodes/ 45 Gy in 25 fractions.   . Hypertension    started around 2011  . Iron deficiency anemia due to chronic blood loss 05/25/2015  . 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 On antineoplastic chemotherapy    5-FU and leucovorin  . Ovarian cyst   . Pneumonia    x 1  . Pulmonary emboli ([place]) 2015  . Stomach cancer ([place]) dx'd 08/2013  . Tuberculosis    ? [id number]  . Wears partial dentures    upper dentures    Past Surgical History:  Procedure Laterality Date  . CESAREAN SECTION     x 2  . ESOPHAGOGASTRODUODENOSCOPY N/A 09/16/2013   Procedure: ESOPHAGOGASTRODUODENOSCOPY (EGD);  Surgeon: Wonda Horner, MD;  Location: [facility];  Service: Endoscopy;  Laterality: N/A;  . ESOPHAGOGASTRODUODENOSCOPY  12/09/2019   [place]     r/t stomach cancer  . IR CV LINE INJECTION  08/30/2017  . IR FLUORO GUIDE PORT INSERTION RIGHT  03/21/2017  . IR US GUIDE VASC ACCESS RIGHT  03/21/2017  . LAPAROSCOPY N/A 09/20/2013   Procedure: DIAGNOSTIC LAPAROSCOPY,DISTAL GASTRECTOMY AND FEEDING GASTROJEJUNOSTOMY;  Surgeon: [name], MD;  Location: [facility];  Service: General;  Laterality: N/A;  . OOPHORECTOMY     Around 1985 left ovary removal  . PORTACATH PLACEMENT Left 11/18/2013   Procedure: INSERTION PORT-A-CATH;  Surgeon: [name], MD;  Location: [facility];  Service: General;  Laterality: Left;  . TONSILLECTOMY     Around 1994  . TUBAL LIGATION    . UNILATERAL SALPINGECTOMY Left 11/09/2017   Procedure: UNILATERAL SALPINGECTOMY;  Surgeon: Osborne Oman, MD;  Location: [facility];  Service: Gynecology;  Laterality: Left;  . UPPER GASTROINTESTINAL ENDOSCOPY    . VAGINAL HYSTERECTOMY Left 11/09/2017   Procedure: HYSTERECTOMY VAGINAL WITH LEFT SALPINGECTOMY;  Surgeon: Osborne Oman, MD;  Location: [facility];  Service: Gynecology;  Laterality: Left;   The following portions of the patient's history were reviewed and updated as appropriate: allergies, current medications, past family history, past medical history, past social history, past surgical history and problem list.   Health Maintenance:  Normal mammogram on 10/23/2019.   Review of Systems:  Pertinent items noted in HPI and remainder of comprehensive ROS otherwise negative.  Physical Exam:   General:  Alert, oriented and cooperative. Patient appears to be in no acute distress.  Mental Status: Normal mood and affect. Normal behavior. Normal  judgment and thought content.   Respiratory: Normal respiratory effort, no problems with respiration noted  Rest of physical exam deferred due to type of encounter     Assessment and Plan:     Menopausal vasomotor syndrome Will continue Neurontin 600 mg po qhs as prescribed, patient was given five refills. No other concerns. She was told to call/send message for any worsening symptoms or other concerns.      I discussed the assessment and treatment plan with the patient. The patient was provided an opportunity to ask questions and all were answered. The patient agreed with the plan and demonstrated an understanding of the instructions.   The patient was advised to call back or seek an in-person evaluation/go to the ED if the symptoms worsen or if the condition fails to improve as anticipated.  I provided 7 minutes of face-to-face time during this encounter.   Verita Schneiders, MD [facility], [place]

## 2020-05-12 ENCOUNTER — Inpatient Hospital Stay: Payer: Medicare Other

## 2020-05-12 ENCOUNTER — Other Ambulatory Visit: Payer: Self-pay

## 2020-05-12 ENCOUNTER — Inpatient Hospital Stay (HOSPITAL_BASED_OUTPATIENT_CLINIC_OR_DEPARTMENT_OTHER): Payer: Medicare Other | Admitting: Hematology & Oncology

## 2020-05-12 ENCOUNTER — Encounter: Payer: Self-pay | Admitting: Hematology & Oncology

## 2020-05-12 ENCOUNTER — Inpatient Hospital Stay: Payer: Medicare Other | Attending: Hematology & Oncology

## 2020-05-12 VITALS — BP 112/79 | HR 68 | Temp 98.4°F | Resp 18 | Wt 110.5 lb

## 2020-05-12 DIAGNOSIS — D509 Iron deficiency anemia, unspecified: Secondary | ICD-10-CM | POA: Insufficient documentation

## 2020-05-12 DIAGNOSIS — C169 Malignant neoplasm of stomach, unspecified: Secondary | ICD-10-CM

## 2020-05-12 DIAGNOSIS — Z85028 Personal history of other malignant neoplasm of stomach: Secondary | ICD-10-CM | POA: Diagnosis not present

## 2020-05-12 DIAGNOSIS — Z9221 Personal history of antineoplastic chemotherapy: Secondary | ICD-10-CM | POA: Insufficient documentation

## 2020-05-12 DIAGNOSIS — D500 Iron deficiency anemia secondary to blood loss (chronic): Secondary | ICD-10-CM

## 2020-05-12 DIAGNOSIS — Z923 Personal history of irradiation: Secondary | ICD-10-CM | POA: Insufficient documentation

## 2020-05-12 DIAGNOSIS — R634 Abnormal weight loss: Secondary | ICD-10-CM

## 2020-05-12 LAB — CMP (CANCER CENTER ONLY)
ALT: 12 U/L (ref 0–44)
AST: 17 U/L (ref 15–41)
Albumin: 3.8 g/dL (ref 3.5–5.0)
Alkaline Phosphatase: 67 U/L (ref 38–126)
Anion gap: 3 — ABNORMAL LOW (ref 5–15)
BUN: 12 mg/dL (ref 6–20)
CO2: 33 mmol/L — ABNORMAL HIGH (ref 22–32)
Calcium: 8.8 mg/dL — ABNORMAL LOW (ref 8.9–10.3)
Chloride: 104 mmol/L (ref 98–111)
Creatinine: 0.98 mg/dL (ref 0.44–1.00)
GFR, Estimated: >60 mL/min (ref >60)
Glucose, Bld: 102 mg/dL — ABNORMAL HIGH (ref 70–99)
Potassium: 4 mmol/L (ref 3.5–5.1)
Sodium: 140 mmol/L (ref 135–145)
Total Bilirubin: 0.5 mg/dL (ref 0.3–1.2)
Total Protein: 6.4 g/dL — ABNORMAL LOW (ref 6.5–8.1)

## 2020-05-12 LAB — CBC WITH DIFFERENTIAL (CANCER CENTER ONLY)
Abs Immature Granulocytes: 0 10*3/uL (ref 0.00–0.07)
Basophils Absolute: 0 10*3/uL (ref 0.0–0.1)
Basophils Relative: 1 %
Eosinophils Absolute: 0.1 10*3/uL (ref 0.0–0.5)
Eosinophils Relative: 2 %
HCT: 32.9 % — ABNORMAL LOW (ref 36.0–46.0)
Hemoglobin: 10.5 g/dL — ABNORMAL LOW (ref 12.0–15.0)
Immature Granulocytes: 0 %
Lymphocytes Relative: 42 %
Lymphs Abs: 1.7 10*3/uL (ref 0.7–4.0)
MCH: 29.2 pg (ref 26.0–34.0)
MCHC: 31.9 g/dL (ref 30.0–36.0)
MCV: 91.6 fL (ref 80.0–100.0)
Monocytes Absolute: 0.4 10*3/uL (ref 0.1–1.0)
Monocytes Relative: 10 %
Neutro Abs: 1.9 10*3/uL (ref 1.7–7.7)
Neutrophils Relative %: 45 %
Platelet Count: 265 10*3/uL (ref 150–400)
RBC: 3.59 MIL/uL — ABNORMAL LOW (ref 3.87–5.11)
RDW: 14 % (ref 11.5–15.5)
WBC Count: 4 10*3/uL (ref 4.0–10.5)
nRBC: 0 % (ref 0.0–0.2)

## 2020-05-12 LAB — RETICULOCYTES
Immature Retic Fract: 11.5 % (ref 2.3–15.9)
RBC.: 3.59 MIL/uL — ABNORMAL LOW (ref 3.87–5.11)
Retic Count, Absolute: 51 10*3/uL (ref 19.0–186.0)
Retic Ct Pct: 1.4 % (ref 0.4–3.1)

## 2020-05-12 MED ORDER — SODIUM CHLORIDE 0.9% FLUSH
10.0000 mL | Freq: Once | INTRAVENOUS | Status: AC
  Administered 2020-05-12: 10 mL
  Filled 2020-05-12: qty 10

## 2020-05-12 MED ORDER — HEPARIN SOD (PORK) LOCK FLUSH 100 UNIT/ML IV SOLN
500.0000 [IU] | Freq: Once | INTRAVENOUS | Status: AC
  Administered 2020-05-12: 500 [IU] via INTRAVENOUS
  Filled 2020-05-12: qty 5

## 2020-05-12 NOTE — Patient Instructions (Signed)

## 2020-05-12 NOTE — Progress Notes (Signed)
Hematology and Oncology Follow Up Visit  Paula [name] [id number] Mar 03, 1965 55 y.o. 05/12/2020   Principle Diagnosis:  Stage IIA (T1a, N2, M0) gastric adenocarcinoma diagnosed in July of 2015 Intermittent iron deficiency anemia  Past Therapy: Status post diagnostic laparoscopy, distal gastrectomy with billroth II anastamosis and feeding gastrojejunostomy tube under the care of Dr. Barry Dienes on 09/20/2013 Adjuvant systemic chemotherapy with 5-FU 600 mg/M2 with leucovorin on days 1, 8, 15, 22, 29 and 36 every 8 weeks Adjuvant concurrent chemoradiation with Xeloda 650 mg/M2 by mouth twice a day for 5 days every week with radiation. The patient completed the course of radiotherapy but she declined to take Xeloda during her radiation  Current Therapy: Observation IV iron as indicated   Interim History:  [name] is here today for follow-up.  She actually looks quite good.  I am so happy for her.  Her weight is back up now.  Her weight is 110 pounds.  She is exercising.  She tries to workout 3 or 4 times a day.  She is eating okay.  She is having no problems with nausea or vomiting.  She did have Covid back in January.  She got over this without any problems.  Her mother had Covid also.  She was actually hospitalized.  [name] has had no problems with bowels or bladder.  She has had no diarrhea.  There is been no bleeding.  I am just happy that her weight is back up.    Currently, her performance status is ECOG 1.  Medications:  Allergies as of 05/12/2020      Reactions   Tape Itching   Ok with adhesive and paper tape       Medication List       Accurate as of May 12, 2020 11:13 AM. If you have any questions, ask your nurse or doctor.        carvedilol 6.25 MG tablet Commonly known as: COREG Take 0.5 tablets (3.125 mg total) by mouth 2 (two) times daily.   Creon 24000-76000 units Cpep Generic drug: Pancrelipase (Lip-Prot-Amyl) Take 1  capsule by mouth 3 (three) times daily.   fluticasone 50 MCG/ACT nasal spray Commonly known as: FLONASE Place 1 spray into both nostrils daily.   gabapentin 600 MG tablet Commonly known as: NEURONTIN Take 1 tablet (600 mg total) by mouth at bedtime.   HYDROcodone-acetaminophen 5-325 MG tablet Commonly known as: NORCO/VICODIN Take 1 tablet by mouth every 6 (six) hours as needed for moderate pain.   lidocaine-prilocaine cream Commonly known as: EMLA Apply 1 application topically as needed.   nitroGLYCERIN 0.4 MG SL tablet Commonly known as: NITROSTAT Place 1 tablet (0.4 mg total) under the tongue every 5 (five) minutes as needed for chest pain.   omeprazole 20 MG capsule Commonly known as: PRILOSEC Take 1 capsule (20 mg total) by mouth 2 (two) times daily before a meal.   ondansetron 4 MG disintegrating tablet Commonly known as: Zofran ODT Take 1 tablet (4 mg total) by mouth every 8 (eight) hours as needed for nausea or vomiting.   promethazine 25 MG suppository Commonly known as: PHENERGAN Place 1 suppository (25 mg total) rectally every 8 (eight) hours as needed for nausea or vomiting.   promethazine 25 MG tablet Commonly known as: PHENERGAN Take 1 tablet (25 mg total) by mouth every 6 (six) hours as needed for nausea or vomiting.   WOMENS MULTIVITAMIN PO Take by mouth.       Allergies:  Allergies  Allergen Reactions  . Tape Itching    Ok with adhesive and paper tape     Past Medical History, Surgical history, Social history, and Family History were reviewed and updated.  Review of Systems: Review of Systems  Constitutional: Negative.   HENT: Negative.   Eyes: Negative.   Respiratory: Negative.   Cardiovascular: Negative.   Gastrointestinal: Negative.   Genitourinary: Negative.   Musculoskeletal: Negative.   Skin: Negative.   Neurological: Negative.   Endo/Heme/Allergies: Negative.   Psychiatric/Behavioral: Negative.      Physical Exam:  weight  is 110 lb 8 oz (50.1 kg). Her oral temperature is 98.4 F (36.9 C). Her blood pressure is 112/79 and her pulse is 68. Her respiration is 18 and oxygen saturation is 100%.   Wt Readings from Last 3 Encounters:  05/12/20 110 lb 8 oz (50.1 kg)  02/18/20 98 lb (44.5 kg)  02/18/20 98 lb (44.5 kg)    Physical Exam Vitals reviewed.  HENT:     Head: Normocephalic and atraumatic.  Eyes:     Pupils: Pupils are equal, round, and reactive to light.  Cardiovascular:     Rate and Rhythm: Normal rate and regular rhythm.     Heart sounds: Normal heart sounds.  Pulmonary:     Effort: Pulmonary effort is normal.     Breath sounds: Normal breath sounds.  Abdominal:     General: Bowel sounds are normal.     Palpations: Abdomen is soft.  Musculoskeletal:        General: No tenderness or deformity. Normal range of motion.     Cervical back: Normal range of motion.  Lymphadenopathy:     Cervical: No cervical adenopathy.  Skin:    General: Skin is warm and dry.     Findings: No erythema or rash.  Neurological:     Mental Status: She is alert and oriented to person, place, and time.  Psychiatric:        Behavior: Behavior normal.        Thought Content: Thought content normal.        Judgment: Judgment normal.      Lab Results  Component Value Date   WBC 4.0 05/12/2020   HGB 10.5 (L) 05/12/2020   HCT 32.9 (L) 05/12/2020   MCV 91.6 05/12/2020   PLT 265 05/12/2020   Lab Results  Component Value Date   FERRITIN 225 02/18/2020   IRON 80 02/18/2020   TIBC 193 (L) 02/18/2020   UIBC 112 (L) 02/18/2020   IRONPCTSAT 42 02/18/2020   Lab Results  Component Value Date   RETICCTPCT 1.4 05/12/2020   RBC 3.59 (L) 05/12/2020   RBC 3.59 (L) 05/12/2020   No results found for: Nils Pyle [facility] Lab Results  Component Value Date   IGGSERUM 1,273 05/21/2015   IGMSERUM 40 05/21/2015   No results found for: Odetta Pink,  SPEI   Chemistry      Component Value Date/Time   NA 140 05/12/2020 1047   NA 140 01/26/2017 1002   NA 140 09/24/2015 0953   K 4.0 05/12/2020 1047   K 3.5 01/26/2017 1002   K 4.0 09/24/2015 0953   CL 104 05/12/2020 1047   CL 106 01/26/2017 1002   CO2 33 (H) 05/12/2020 1047   CO2 26 01/26/2017 1002   CO2 23 09/24/2015 0953   BUN 12 05/12/2020 1047   BUN 6 (L) 01/26/2017 1002   BUN 8.6 09/24/2015 0953  CREATININE 0.98 05/12/2020 1047   CREATININE 1.1 01/26/2017 1002   CREATININE 1.0 09/24/2015 0953      Component Value Date/Time   CALCIUM 8.8 (L) 05/12/2020 1047   CALCIUM 8.9 01/26/2017 1002   CALCIUM 9.6 09/24/2015 0953   ALKPHOS 67 05/12/2020 1047   ALKPHOS 68 01/26/2017 1002   ALKPHOS 99 09/24/2015 0953   AST 17 05/12/2020 1047   AST 17 09/24/2015 0953   ALT 12 05/12/2020 1047   ALT 17 01/26/2017 1002   ALT 11 09/24/2015 0953   BILITOT 0.5 05/12/2020 1047   BILITOT 0.83 09/24/2015 0953       Impression and Plan: [name] is a pleasant54yo African American female with history of stage II gastric cancer and partial gastrectomy followed by adjuvant chemo and radiation.  She was having problems with weight loss but this certainly seems to be over now.  She is exercising.  She looks great.  I really do not think we need to get any scans on her at the present time.  We will plan for her follow-up in 3 months.  I think this would be very reasonable.  If there is any problems, she certainly will let us know.  Volanda Napoleon, MD 3/22/202211:13 AM

## 2020-05-13 LAB — IRON AND TIBC
Iron: 73 ug/dL (ref 41–142)
Saturation Ratios: 32 % (ref 21–57)
TIBC: 228 ug/dL — ABNORMAL LOW (ref 236–444)
UIBC: 154 ug/dL (ref 120–384)

## 2020-05-13 LAB — FERRITIN: Ferritin: 78 ng/mL (ref 11–307)

## 2020-05-20 ENCOUNTER — Telehealth: Payer: Self-pay | Admitting: *Deleted

## 2020-05-20 NOTE — Telephone Encounter (Signed)
Patient called stating that she has been throwing up every night for 2 weeks.  Asked patient if Dr Marin Olp was aware since she saw him 8 days ago.  Patient then said it has only been a week and Dr Marin Olp wasn't aware.  Asked if she should take her nausea medicine before she eats.  Dr Marin Olp informed of above.  Advised patient to take Phenergan or Zofran before she eats at night and to contact GI if she continues to have issues with this.  Patient in agreement with above

## 2020-05-24 ENCOUNTER — Other Ambulatory Visit (HOSPITAL_BASED_OUTPATIENT_CLINIC_OR_DEPARTMENT_OTHER): Payer: Self-pay

## 2020-05-27 ENCOUNTER — Other Ambulatory Visit: Payer: Self-pay

## 2020-05-27 DIAGNOSIS — C169 Malignant neoplasm of stomach, unspecified: Secondary | ICD-10-CM

## 2020-05-27 DIAGNOSIS — C163 Malignant neoplasm of pyloric antrum: Secondary | ICD-10-CM

## 2020-05-27 MED ORDER — HYDROCODONE-ACETAMINOPHEN 5-325 MG PO TABS: 1.0000 | ORAL_TABLET | Freq: Four times a day (QID) | ORAL | 0 refills | Status: DC | PRN

## 2020-05-29 ENCOUNTER — Other Ambulatory Visit: Payer: Self-pay | Admitting: *Deleted

## 2020-05-29 ENCOUNTER — Other Ambulatory Visit (HOSPITAL_BASED_OUTPATIENT_CLINIC_OR_DEPARTMENT_OTHER): Payer: Self-pay

## 2020-05-29 DIAGNOSIS — C169 Malignant neoplasm of stomach, unspecified: Secondary | ICD-10-CM

## 2020-05-29 DIAGNOSIS — C163 Malignant neoplasm of pyloric antrum: Secondary | ICD-10-CM

## 2020-05-29 MED ORDER — HYDROCODONE-ACETAMINOPHEN 5-325 MG PO TABS
1.0000 | ORAL_TABLET | Freq: Four times a day (QID) | ORAL | 0 refills | Status: DC | PRN
  Filled 2020-05-29: qty 60, 15d supply, fill #0

## 2020-06-02 ENCOUNTER — Other Ambulatory Visit (HOSPITAL_BASED_OUTPATIENT_CLINIC_OR_DEPARTMENT_OTHER): Payer: Self-pay

## 2020-06-10 ENCOUNTER — Other Ambulatory Visit (HOSPITAL_BASED_OUTPATIENT_CLINIC_OR_DEPARTMENT_OTHER): Payer: Self-pay

## 2020-06-23 ENCOUNTER — Inpatient Hospital Stay: Payer: Medicare Other | Attending: Hematology & Oncology

## 2020-06-25 ENCOUNTER — Other Ambulatory Visit: Payer: Self-pay | Admitting: *Deleted

## 2020-06-25 DIAGNOSIS — C169 Malignant neoplasm of stomach, unspecified: Secondary | ICD-10-CM

## 2020-06-25 DIAGNOSIS — C163 Malignant neoplasm of pyloric antrum: Secondary | ICD-10-CM

## 2020-06-25 MED ORDER — HYDROCODONE-ACETAMINOPHEN 5-325 MG PO TABS: 1.0000 | ORAL_TABLET | Freq: Four times a day (QID) | ORAL | 0 refills | Status: DC | PRN

## 2020-07-23 ENCOUNTER — Other Ambulatory Visit: Payer: Self-pay | Admitting: *Deleted

## 2020-07-23 DIAGNOSIS — C169 Malignant neoplasm of stomach, unspecified: Secondary | ICD-10-CM

## 2020-07-23 DIAGNOSIS — C163 Malignant neoplasm of pyloric antrum: Secondary | ICD-10-CM

## 2020-07-23 MED ORDER — HYDROCODONE-ACETAMINOPHEN 5-325 MG PO TABS: 1.0000 | ORAL_TABLET | Freq: Four times a day (QID) | ORAL | 0 refills | Status: DC | PRN

## 2020-08-06 ENCOUNTER — Telehealth: Payer: Self-pay

## 2020-08-07 ENCOUNTER — Telehealth: Payer: Self-pay

## 2020-08-07 NOTE — Telephone Encounter (Signed)
Palliative Care Volunteer call made to check in on patient. No answer.

## 2020-08-12 ENCOUNTER — Inpatient Hospital Stay (HOSPITAL_BASED_OUTPATIENT_CLINIC_OR_DEPARTMENT_OTHER): Payer: Medicare Other | Admitting: Family

## 2020-08-12 ENCOUNTER — Inpatient Hospital Stay: Payer: Medicare Other | Attending: Hematology & Oncology

## 2020-08-12 ENCOUNTER — Other Ambulatory Visit: Payer: Self-pay

## 2020-08-12 ENCOUNTER — Telehealth: Payer: Self-pay | Admitting: *Deleted

## 2020-08-12 ENCOUNTER — Encounter: Payer: Self-pay | Admitting: Family

## 2020-08-12 ENCOUNTER — Inpatient Hospital Stay: Payer: Medicare Other

## 2020-08-12 ENCOUNTER — Other Ambulatory Visit: Payer: Self-pay | Admitting: *Deleted

## 2020-08-12 VITALS — BP 110/76 | HR 76 | Temp 98.0°F | Resp 17

## 2020-08-12 DIAGNOSIS — C169 Malignant neoplasm of stomach, unspecified: Secondary | ICD-10-CM

## 2020-08-12 DIAGNOSIS — D500 Iron deficiency anemia secondary to blood loss (chronic): Secondary | ICD-10-CM

## 2020-08-12 DIAGNOSIS — Z95828 Presence of other vascular implants and grafts: Secondary | ICD-10-CM

## 2020-08-12 DIAGNOSIS — R110 Nausea: Secondary | ICD-10-CM

## 2020-08-12 DIAGNOSIS — Z923 Personal history of irradiation: Secondary | ICD-10-CM | POA: Insufficient documentation

## 2020-08-12 DIAGNOSIS — Z9221 Personal history of antineoplastic chemotherapy: Secondary | ICD-10-CM | POA: Insufficient documentation

## 2020-08-12 DIAGNOSIS — D509 Iron deficiency anemia, unspecified: Secondary | ICD-10-CM | POA: Diagnosis present

## 2020-08-12 DIAGNOSIS — Z85028 Personal history of other malignant neoplasm of stomach: Secondary | ICD-10-CM | POA: Insufficient documentation

## 2020-08-12 DIAGNOSIS — T451X5A Adverse effect of antineoplastic and immunosuppressive drugs, initial encounter: Secondary | ICD-10-CM

## 2020-08-12 DIAGNOSIS — R112 Nausea with vomiting, unspecified: Secondary | ICD-10-CM

## 2020-08-12 LAB — CMP (CANCER CENTER ONLY)
ALT: 12 U/L (ref 0–44)
AST: 20 U/L (ref 15–41)
Albumin: 3.9 g/dL (ref 3.5–5.0)
Alkaline Phosphatase: 70 U/L (ref 38–126)
Anion gap: 7 (ref 5–15)
BUN: 19 mg/dL (ref 6–20)
CO2: 29 mmol/L (ref 22–32)
Calcium: 9 mg/dL (ref 8.9–10.3)
Chloride: 102 mmol/L (ref 98–111)
Creatinine: 0.85 mg/dL (ref 0.44–1.00)
GFR, Estimated: >60 mL/min (ref >60)
Glucose, Bld: 125 mg/dL — ABNORMAL HIGH (ref 70–99)
Potassium: 4 mmol/L (ref 3.5–5.1)
Sodium: 138 mmol/L (ref 135–145)
Total Bilirubin: 0.6 mg/dL (ref 0.3–1.2)
Total Protein: 6.4 g/dL — ABNORMAL LOW (ref 6.5–8.1)

## 2020-08-12 LAB — CBC WITH DIFFERENTIAL (CANCER CENTER ONLY)
Abs Immature Granulocytes: 0 10*3/uL (ref 0.00–0.07)
Basophils Absolute: 0 10*3/uL (ref 0.0–0.1)
Basophils Relative: 1 %
Eosinophils Absolute: 0.1 10*3/uL (ref 0.0–0.5)
Eosinophils Relative: 3 %
HCT: 32.7 % — ABNORMAL LOW (ref 36.0–46.0)
Hemoglobin: 10.3 g/dL — ABNORMAL LOW (ref 12.0–15.0)
Immature Granulocytes: 0 %
Lymphocytes Relative: 36 %
Lymphs Abs: 1.5 10*3/uL (ref 0.7–4.0)
MCH: 28.1 pg (ref 26.0–34.0)
MCHC: 31.5 g/dL (ref 30.0–36.0)
MCV: 89.3 fL (ref 80.0–100.0)
Monocytes Absolute: 0.4 10*3/uL (ref 0.1–1.0)
Monocytes Relative: 9 %
Neutro Abs: 2.2 10*3/uL (ref 1.7–7.7)
Neutrophils Relative %: 51 %
Platelet Count: 242 10*3/uL (ref 150–400)
RBC: 3.66 MIL/uL — ABNORMAL LOW (ref 3.87–5.11)
RDW: 13.5 % (ref 11.5–15.5)
WBC Count: 4.2 10*3/uL (ref 4.0–10.5)
nRBC: 0 % (ref 0.0–0.2)

## 2020-08-12 LAB — LACTATE DEHYDROGENASE: LDH: 119 U/L (ref 98–192)

## 2020-08-12 MED ORDER — HEPARIN SOD (PORK) LOCK FLUSH 100 UNIT/ML IV SOLN
500.0000 [IU] | Freq: Once | INTRAVENOUS | Status: AC
  Administered 2020-08-12: 500 [IU] via INTRAVENOUS
  Filled 2020-08-12: qty 5

## 2020-08-12 MED ORDER — SODIUM CHLORIDE 0.9% FLUSH
10.0000 mL | Freq: Once | INTRAVENOUS | Status: AC
Start: 2020-08-12 — End: 2020-08-12
  Administered 2020-08-12: 10 mL via INTRAVENOUS
  Filled 2020-08-12: qty 10

## 2020-08-12 MED ORDER — PROMETHAZINE HCL 25 MG PO TABS: ORAL_TABLET | ORAL | 2 refills | Status: DC

## 2020-08-12 NOTE — Progress Notes (Signed)
Hematology and Oncology Follow Up Visit  Paula [name] [id number] 02-Jan-1966 55 y.o. 08/12/2020   Principle Diagnosis:  Stage IIA (T1a, N2, M0) gastric adenocarcinoma diagnosed in July of 2015 Intermittent iron deficiency anemia   Past Therapy: Status post diagnostic laparoscopy, distal gastrectomy with billroth II anastamosis and feeding gastrojejunostomy tube under the care of Dr. Barry Dienes on 09/20/2013 Adjuvant systemic chemotherapy with 5-FU 600 mg/M2 with leucovorin on days 1, 8, 15, 22, 29 and 36 every 8 weeks Adjuvant concurrent chemoradiation with Xeloda 650 mg/M2 by mouth twice a day for 5 days every week with radiation. The patient completed the course of radiotherapy but she declined to take Xeloda during her radiation   Current Therapy:        Observation IV iron as indicated    Interim History:  [name] is here today for follow-up. She is doing really well and is so encouraged. Her weight is up to 122 lbs.  She has registered for an education class and is very excited to start. She requested a letter for her instructor letting them know that she has some short term memory issues since treatment as well as occasional fatigue and dizziness associated with anemia.  No fever, chills, cough, rash, dizziness, SOB, chest pain, palpitations, abdominal pain or changes in bowel or bladder habits.  She has some nausea still at times when eating and states that takes a phenergan beforehand if needed.  She has not noted any blood loss. No bruising or petechiae.  No swelling or tenderness in her extremities.  No numbness or tingling.  No falls or syncope to report.  She has been eating well and staying hydrated throughout the day.   ECOG Performance Status: 1 - Symptomatic but completely ambulatory  Medications:  Allergies as of 08/12/2020       Reactions   Tape Itching   Ok with adhesive and paper tape         Medication List        Accurate as of August 12, 2020 10:59 AM. If you have any questions, ask your nurse or doctor.          carvedilol 6.25 MG tablet Commonly known as: COREG Take 0.5 tablets (3.125 mg total) by mouth 2 (two) times daily.   Creon 24000-76000 units Cpep Generic drug: Pancrelipase (Lip-Prot-Amyl) Take 1 capsule by mouth 3 (three) times daily.   fluticasone 50 MCG/ACT nasal spray Commonly known as: FLONASE Place 1 spray into both nostrils daily.   gabapentin 600 MG tablet Commonly known as: NEURONTIN Take 1 tablet (600 mg total) by mouth at bedtime.   HYDROcodone-acetaminophen 5-325 MG tablet Commonly known as: NORCO/VICODIN Take 1 tablet by mouth every 6 (six) hours as needed for moderate pain.   lidocaine-prilocaine cream Commonly known as: EMLA Apply 1 application topically as needed.   nitroGLYCERIN 0.4 MG SL tablet Commonly known as: NITROSTAT Place 1 tablet (0.4 mg total) under the tongue every 5 (five) minutes as needed for chest pain.   omeprazole 20 MG capsule Commonly known as: PRILOSEC Take 1 capsule (20 mg total) by mouth 2 (two) times daily before a meal.   ondansetron 4 MG disintegrating tablet Commonly known as: Zofran ODT Take 1 tablet (4 mg total) by mouth every 8 (eight) hours as needed for nausea or vomiting.   promethazine 25 MG suppository Commonly known as: PHENERGAN Place 1 suppository (25 mg total) rectally every 8 (eight) hours as needed for nausea or vomiting.  promethazine 25 MG tablet Commonly known as: PHENERGAN TAKE 1 TABLET BY MOUTH EVERY 6 HOURS AS NEEDED FOR NAUSEA & VOMITING   promethazine 25 MG tablet Commonly known as: PHENERGAN Take 1 tablet (25 mg total) by mouth every 6 (six) hours as needed for nausea or vomiting.   WOMENS MULTIVITAMIN PO Take by mouth.        Allergies:  Allergies  Allergen Reactions   Tape Itching    Ok with adhesive and paper tape     Past Medical History, Surgical history, Social history, and Family History were  reviewed and updated.  Review of Systems: All other 10 point review of systems is negative.   Physical Exam:  oral temperature is 98 F (36.7 C). Her blood pressure is 110/76 and her pulse is 76. Her respiration is 17 and oxygen saturation is 100%.   Wt Readings from Last 3 Encounters:  05/12/20 110 lb 8 oz (50.1 kg)  02/18/20 98 lb (44.5 kg)  02/18/20 98 lb (44.5 kg)    Ocular: Sclerae unicteric, pupils equal, round and reactive to light Ear-nose-throat: Oropharynx clear, dentition fair Lymphatic: No cervical or supraclavicular adenopathy Lungs no rales or rhonchi, good excursion bilaterally Heart regular rate and rhythm, no murmur appreciated Abd soft, nontender, positive bowel sounds MSK no focal spinal tenderness, no joint edema Neuro: non-focal, well-oriented, appropriate affect Breasts: Deferred   Lab Results  Component Value Date   WBC 4.2 08/12/2020   HGB 10.3 (L) 08/12/2020   HCT 32.7 (L) 08/12/2020   MCV 89.3 08/12/2020   PLT 242 08/12/2020   Lab Results  Component Value Date   FERRITIN 78 05/12/2020   IRON 73 05/12/2020   TIBC 228 (L) 05/12/2020   UIBC 154 05/12/2020   IRONPCTSAT 32 05/12/2020   Lab Results  Component Value Date   RETICCTPCT 1.4 05/12/2020   RBC 3.66 (L) 08/12/2020   No results found for: [facility] Lab Results  Component Value Date   IGGSERUM 1,273 05/21/2015   IGMSERUM 40 05/21/2015   No results found for: Odetta Pink, SPEI   Chemistry      Component Value Date/Time   NA 138 08/12/2020 1000   NA 140 01/26/2017 1002   NA 140 09/24/2015 0953   K 4.0 08/12/2020 1000   K 3.5 01/26/2017 1002   K 4.0 09/24/2015 0953   CL 102 08/12/2020 1000   CL 106 01/26/2017 1002   CO2 29 08/12/2020 1000   CO2 26 01/26/2017 1002   CO2 23 09/24/2015 0953   BUN 19 08/12/2020 1000   BUN 6 (L) 01/26/2017 1002   BUN 8.6 09/24/2015 0953   CREATININE 0.85  08/12/2020 1000   CREATININE 1.1 01/26/2017 1002   CREATININE 1.0 09/24/2015 0953      Component Value Date/Time   CALCIUM 9.0 08/12/2020 1000   CALCIUM 8.9 01/26/2017 1002   CALCIUM 9.6 09/24/2015 0953   ALKPHOS 70 08/12/2020 1000   ALKPHOS 68 01/26/2017 1002   ALKPHOS 99 09/24/2015 0953   AST 20 08/12/2020 1000   AST 17 09/24/2015 0953   ALT 12 08/12/2020 1000   ALT 17 01/26/2017 1002   ALT 11 09/24/2015 0953   BILITOT 0.6 08/12/2020 1000   BILITOT 0.83 09/24/2015 0953       Impression and Plan: [name] is a pleasant 55 yo African American female with history of stage II gastric cancer and partial gastrectomy followed by adjuvant chemo and radiation.  She is doing quite well and has no complaints at this time.  We did give her a letter for her instructor as mentioned above.  Iron studies are pending. We will replace if needed.  Port flush every 8 weeks and follow-up in 4 months.  She can contact our office with any questions or concerns.   Laverna Peace, NP 6/22/202210:59 AM

## 2020-08-12 NOTE — Patient Instructions (Signed)

## 2020-08-12 NOTE — Telephone Encounter (Signed)
Per 08/12/20 los - gave patient upcoming appointments - patient confirmed

## 2020-08-13 LAB — IRON AND TIBC
Iron: 93 ug/dL (ref 41–142)
Saturation Ratios: 45 % (ref 21–57)
TIBC: 205 ug/dL — ABNORMAL LOW (ref 236–444)
UIBC: 112 ug/dL — ABNORMAL LOW (ref 120–384)

## 2020-08-13 LAB — FERRITIN: Ferritin: 104 ng/mL (ref 11–307)

## 2020-08-21 ENCOUNTER — Other Ambulatory Visit: Payer: Self-pay | Admitting: *Deleted

## 2020-08-21 DIAGNOSIS — C163 Malignant neoplasm of pyloric antrum: Secondary | ICD-10-CM

## 2020-08-21 DIAGNOSIS — C169 Malignant neoplasm of stomach, unspecified: Secondary | ICD-10-CM

## 2020-08-21 MED ORDER — HYDROCODONE-ACETAMINOPHEN 5-325 MG PO TABS: 1.0000 | ORAL_TABLET | Freq: Four times a day (QID) | ORAL | 0 refills | Status: DC | PRN

## 2020-09-04 ENCOUNTER — Telehealth: Payer: Self-pay | Admitting: Family Medicine

## 2020-09-04 ENCOUNTER — Other Ambulatory Visit (HOSPITAL_BASED_OUTPATIENT_CLINIC_OR_DEPARTMENT_OTHER): Payer: Self-pay

## 2020-09-04 DIAGNOSIS — N951 Menopausal and female climacteric states: Secondary | ICD-10-CM

## 2020-09-04 MED ORDER — GABAPENTIN 600 MG PO TABS
600.0000 mg | ORAL_TABLET | Freq: Two times a day (BID) | ORAL | 1 refills | Status: DC
  Filled 2020-09-04 – 2020-09-15 (×2): qty 60, 30d supply, fill #0

## 2020-09-04 NOTE — Telephone Encounter (Signed)
Patient is requesting a Rx for Gabapentin state she need this today

## 2020-09-04 NOTE — Telephone Encounter (Signed)
Call placed back to pt. Pt states out of gabapentin Rx. Pt states is taking BID 600mg  due to increased vasomotor symptoms. Pt requesting refill.  Reviewed with Dr Dione Plover, pt ok to have refill and advised to have follow up with Dr Harolyn Rutherford . Pt verbalized understanding and agreeable to plan of care.   Colletta Maryland, RN

## 2020-09-11 ENCOUNTER — Other Ambulatory Visit (HOSPITAL_BASED_OUTPATIENT_CLINIC_OR_DEPARTMENT_OTHER): Payer: Self-pay

## 2020-09-15 ENCOUNTER — Other Ambulatory Visit (HOSPITAL_BASED_OUTPATIENT_CLINIC_OR_DEPARTMENT_OTHER): Payer: Self-pay

## 2020-09-15 ENCOUNTER — Telehealth: Payer: Self-pay | Admitting: Lactation Services

## 2020-09-15 NOTE — Telephone Encounter (Signed)
Patient called and reports she is not able to get her Gabapentin prescrition from Pharmacy. Per chart review, medication was sent on 7/15, prescription does not indicate receipt was received by the Pharmacy.   Called Pharmacy and spoke with Abbe Amsterdam who reports prescription was received and was filled and then returned to stock as was not picked up. He was able to get prescription to process while on the phone.   Called patient and informed her the prescription was sent to the [facility], patient reports she would like it sent to [facility], advised her to call [facility] and have prescription transferred if she would like. Patient voiced understanding.

## 2020-09-22 ENCOUNTER — Other Ambulatory Visit: Payer: Self-pay

## 2020-09-22 ENCOUNTER — Inpatient Hospital Stay: Payer: Medicare Other

## 2020-09-22 DIAGNOSIS — C163 Malignant neoplasm of pyloric antrum: Secondary | ICD-10-CM

## 2020-09-22 DIAGNOSIS — C169 Malignant neoplasm of stomach, unspecified: Secondary | ICD-10-CM

## 2020-09-22 NOTE — Progress Notes (Unsigned)
Patient called requesting refill on her hydrocodone. Refill sent to MD to approve.

## 2020-09-23 ENCOUNTER — Other Ambulatory Visit: Payer: Self-pay

## 2020-09-23 ENCOUNTER — Other Ambulatory Visit (HOSPITAL_BASED_OUTPATIENT_CLINIC_OR_DEPARTMENT_OTHER): Payer: Self-pay

## 2020-09-23 DIAGNOSIS — C169 Malignant neoplasm of stomach, unspecified: Secondary | ICD-10-CM

## 2020-09-23 DIAGNOSIS — C163 Malignant neoplasm of pyloric antrum: Secondary | ICD-10-CM

## 2020-09-23 MED ORDER — HYDROCODONE-ACETAMINOPHEN 5-325 MG PO TABS
1.0000 | ORAL_TABLET | Freq: Four times a day (QID) | ORAL | 0 refills | Status: DC | PRN
  Filled 2020-09-23: qty 60, 15d supply, fill #0

## 2020-10-05 ENCOUNTER — Emergency Department (HOSPITAL_BASED_OUTPATIENT_CLINIC_OR_DEPARTMENT_OTHER): Payer: Medicare Other

## 2020-10-05 ENCOUNTER — Encounter (HOSPITAL_BASED_OUTPATIENT_CLINIC_OR_DEPARTMENT_OTHER): Payer: Self-pay

## 2020-10-05 ENCOUNTER — Other Ambulatory Visit: Payer: Self-pay

## 2020-10-05 ENCOUNTER — Emergency Department (HOSPITAL_BASED_OUTPATIENT_CLINIC_OR_DEPARTMENT_OTHER)
Admission: EM | Admit: 2020-10-05 | Discharge: 2020-10-05 | Disposition: A | Discharge status: Home / Self Care | Payer: Medicare Other | Attending: Emergency Medicine | Admitting: Emergency Medicine

## 2020-10-05 DIAGNOSIS — S93601A Unspecified sprain of right foot, initial encounter: Secondary | ICD-10-CM

## 2020-10-05 DIAGNOSIS — Z87891 Personal history of nicotine dependence: Secondary | ICD-10-CM | POA: Insufficient documentation

## 2020-10-05 DIAGNOSIS — S99921A Unspecified injury of right foot, initial encounter: Secondary | ICD-10-CM | POA: Diagnosis present

## 2020-10-05 DIAGNOSIS — S93401A Sprain of unspecified ligament of right ankle, initial encounter: Secondary | ICD-10-CM | POA: Diagnosis not present

## 2020-10-05 DIAGNOSIS — I10 Essential (primary) hypertension: Secondary | ICD-10-CM | POA: Insufficient documentation

## 2020-10-05 DIAGNOSIS — W108XXA Fall (on) (from) other stairs and steps, initial encounter: Secondary | ICD-10-CM | POA: Insufficient documentation

## 2020-10-05 DIAGNOSIS — Z85028 Personal history of other malignant neoplasm of stomach: Secondary | ICD-10-CM | POA: Insufficient documentation

## 2020-10-05 NOTE — ED Notes (Signed)
Ice applied to right foot.

## 2020-10-05 NOTE — ED Provider Notes (Signed)
[facility] EMERGENCY DEPARTMENT Provider Note   CSN: [id number] Arrival date & time: 10/05/20  1900     History Chief Complaint  Patient presents with   Foot Injury    Rhetta A Smith-Golden is a 55 y.o. female.  Patient presents the emergency department for evaluation of right foot pain.  She states that she tripped while going downstairs at [facility] today.  She has pain on the base of her foot on the inside.  She has taken over-the-counter medications prior to arrival.  Ice applied on arrival.  She has been having difficulty bearing weight since that time.  Denies knee or hip pain.      Past Medical History:  Diagnosis Date   Anxiety    per pt   Arthritis    back   Barrett esophagus    Costochondritis    Esophageal reflux    On omeprazole   Family history of cancer    Headache(784.0) 2011   Migraines   Heart murmur    never caused any problems   History of hiatal hernia    History of radiation therapy 05/12/14- 06/17/14   post-op gastric region/ nodes/ 45 Gy in 25 fractions.    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   Hypoalbuminemia 12/18/2013   Malfunction of  device 12/18/2013   Leg edema 12/18/2013   Neoplasm related pain 12/18/2013   Other pancytopenia ([place]) 12/13/2013   Hyponatremia 12/12/2013   Hypokalemia 12/12/2013   Weight loss 12/04/2013   Abnormal loss of weight 12/04/2013   Family history of cancer    Cancer of antrum of stomach ([place]) 11/07/2013   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   Somatization disorder 11/05/2013   Palliative care encounter 11/04/2013   Acute kidney injury ([place]) 11/03/2013   Chronic low back pain 10/31/2013   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   Constipation, chronic 10/28/2013   Protein-calorie malnutrition, severe ([place]) 10/23/2013   Intractable nausea and vomiting 10/22/2013   Intractable vomiting 10/22/2013   Gastric carcinoma s/p Distal gastrectomy/GJ (Billroth II) 09/20/2013 09/18/2013   Carcinoma of stomach ([place]) 09/18/2013   Antral ulcer 09/16/2013   Migraine headache 09/14/2013   PTSD (post-traumatic stress disorder) 08/19/2013   Left knee injury 07/30/2013   Mitral regurgitation 02/15/2013   Chest pain 01/23/2012   Essential hypertension 01/23/2012   Murmur 01/23/2012    Past Surgical History:  Procedure Laterality Date   CESAREAN SECTION     x 2   ESOPHAGOGASTRODUODENOSCOPY N/A 09/16/2013   Procedure: ESOPHAGOGASTRODUODENOSCOPY (EGD);  Surgeon: Wonda Horner, MD;  Location:  [facility];  Service: Endoscopy;  Laterality: N/A;   ESOPHAGOGASTRODUODENOSCOPY  12/09/2019   [facility]   GASTROSTOMY     r/t stomach cancer   IR CV LINE INJECTION  08/30/2017   IR FLUORO GUIDE PORT INSERTION RIGHT  03/21/2017   IR US GUIDE VASC ACCESS RIGHT  03/21/2017   LAPAROSCOPY N/A 09/20/2013   Procedure: DIAGNOSTIC LAPAROSCOPY,DISTAL GASTRECTOMY AND FEEDING GASTROJEJUNOSTOMY;  Surgeon: [name], MD;  Location: [facility] ORS;  Service: General;  Laterality: N/A;   OOPHORECTOMY     Around 1985 left ovary removal   PORTACATH PLACEMENT Left 11/18/2013   Procedure: INSERTION PORT-A-CATH;  Surgeon: [name], MD;  Location: [facility] ORS;  Service: General;  Laterality: Left;   TONSILLECTOMY     Around 1994   TUBAL LIGATION     UNILATERAL SALPINGECTOMY Left 11/09/2017   Procedure: UNILATERAL SALPINGECTOMY;  Surgeon: Osborne Oman, MD;  Location: [facility];  Service: Gynecology;  Laterality: Left;   UPPER GASTROINTESTINAL ENDOSCOPY     VAGINAL HYSTERECTOMY Left 11/09/2017   Procedure: HYSTERECTOMY VAGINAL WITH LEFT SALPINGECTOMY;  Surgeon: Osborne Oman, MD;  Location: [facility];  Service: Gynecology;  Laterality: Left;     OB History     Gravida  2   Para  2   Term  1   Preterm  1   AB      Living  2      SAB      IAB      Ectopic      Multiple      Live Births  2           Family History  Problem Relation Age of Onset   Hypertension Mother    Diabetes Mellitus II Mother    Other Mother        hx of hysterectomy for unspecified reason   Hypertension Sister    Hypertension Brother    Heart failure Maternal Grandmother    Heart Problems Maternal Grandmother    Hypertension Maternal Grandfather    Diabetes Maternal Grandfather    Colon cancer Paternal Uncle        dx. late 50s-early 60s   Gastric cancer Cousin 60   Colon cancer Cousin        dx. early-late 71s; (father also had colon cancer)   Colon cancer Paternal Uncle        dx. late 50s-early 60s   Lung cancer Paternal Uncle        dx. 18s; smoker   Colon polyps Paternal Uncle        unknown number   Heart attack Maternal Aunt    Heart Problems Paternal Aunt    Heart attack Paternal Grandfather    Colon cancer Paternal Grandfather        dx. 68s-60s   Colon polyps Brother        1 maternal half-brother had approx 3 polyps   Breast cancer Cousin 31   Breast cancer Cousin        dx. early 41s or earlier (father had colon cancer)   Lung cancer Other        PGF's brothers (x4); dx. later in life; lim info   Esophageal cancer Neg Hx    Rectal cancer Neg Hx     Social History   Tobacco Use    Smoking status: Former    Packs/day: 1.00    Years: 16.00    Pack years: 16.00    Types:  Cigarettes    Quit date: 02/21/1998    Years since quitting: 22.6   Smokeless tobacco: Never   Tobacco comments:    0.5-1.0 ppd for 16 yrs  Vaping Use   Vaping Use: Never used  Substance Use Topics   Alcohol use: Not Currently   Drug use: Not Currently    Home Medications Prior to Admission medications   Medication Sig Start Date End Date Taking? Authorizing Provider  carvedilol (COREG) 6.25 MG tablet Take 0.5 tablets (3.125 mg total) by mouth 2 (two) times daily. Patient not taking: Reported on 08/12/2020 01/28/19   Lelon Perla, MD  CREON [phone] units CPEP Take 1 capsule by mouth 3 (three) times daily. Patient not taking: Reported on 08/12/2020 12/17/19   [provider]  fluticasone (FLONASE) 50 MCG/ACT nasal spray Place 1 spray into both nostrils daily. 12/10/18   Khatri, Hina, PA-C  gabapentin (NEURONTIN) 600 MG tablet Take 1 tablet (600 mg total) by mouth 2 (two) times daily. 09/04/20 11/03/20  Anyanwu, Sallyanne Havers, MD  HYDROcodone-acetaminophen (NORCO/VICODIN) 5-325 MG tablet Take 1 tablet by mouth every 6 (six) hours as needed for moderate pain. 09/23/20   Volanda Napoleon, MD  lidocaine-prilocaine (EMLA) cream Apply 1 application topically as needed. 02/05/19   Volanda Napoleon, MD  Multiple Vitamins-Minerals (WOMENS MULTIVITAMIN PO) Take by mouth.    [provider]  nitroGLYCERIN (NITROSTAT) 0.4 MG SL tablet Place 1 tablet (0.4 mg total) under the tongue every 5 (five) minutes as needed for chest pain. Patient not taking: No sig reported 12/20/18 05/15/19  Lendon Colonel, NP  omeprazole (PRILOSEC) 20 MG capsule Take 1 capsule (20 mg total) by mouth 2 (two) times daily before a meal. 12/20/19   Jackquline Denmark, MD  ondansetron (ZOFRAN ODT) 4 MG disintegrating tablet Take 1 tablet (4 mg total) by mouth every 8 (eight) hours as needed for nausea or vomiting. 03/14/19    Jackquline Denmark, MD  promethazine (PHENERGAN) 25 MG suppository Place 1 suppository (25 mg total) rectally every 8 (eight) hours as needed for nausea or vomiting. Patient not taking: Reported on 08/12/2020 12/18/19   Little, Wenda Overland, MD  promethazine (PHENERGAN) 25 MG tablet TAKE 1 TABLET BY MOUTH EVERY 6 HOURS AS NEEDED FOR NAUSEA & VOMITING 08/12/20 08/12/21  Volanda Napoleon, MD    Allergies    Tape  Review of Systems   Review of Systems  Constitutional:  Negative for activity change.  Musculoskeletal:  Positive for arthralgias, gait problem and joint swelling. Negative for back pain.  Skin:  Negative for wound.  Neurological:  Negative for weakness and numbness.   Physical Exam Updated Vital Signs BP 121/78 (BP Location: Right Arm)   Pulse 74   Temp 98.1 F (36.7 C) (Oral)   Resp 18   Ht '5\' 2"'$  (1.575 m)   Wt 57.2 kg   LMP 02/28/2016 (Exact Date)   SpO2 100%   BMI 23.05 kg/m   Physical Exam Vitals and nursing note reviewed.  Constitutional:      Appearance: She is well-developed.  HENT:     Head: Normocephalic and atraumatic.  Eyes:     Pupils: Pupils are equal, round, and reactive to light.  Cardiovascular:     Pulses: Normal pulses. No decreased pulses.  Musculoskeletal:        General: Tenderness present.     Cervical back: Normal range of motion and neck supple.     Right hip: No tenderness. Normal range of  motion.     Right knee: Normal range of motion. No tenderness.     Right ankle: No tenderness. Normal range of motion.     Right foot: Decreased range of motion. Bunion, tenderness and bony tenderness present.     Comments: Patient with tenderness over the first metatarsal area.  Skin:    General: Skin is warm and dry.  Neurological:     Mental Status: She is alert.     Sensory: No sensory deficit.     Comments: Motor, sensation, and vascular distal to the injury is fully intact.   Psychiatric:        Mood and Affect: Mood normal.    ED Results /  Procedures / Treatments   Labs (all labs ordered are listed, but only abnormal results are displayed) Labs Reviewed - No data to display  EKG None  Radiology DG Foot Complete Right  Result Date: 10/05/2020 CLINICAL DATA:  Recent fall with foot pain, initial encounter EXAM: RIGHT FOOT COMPLETE - 3+ VIEW COMPARISON:  None. FINDINGS: Mild hallux valgus deformity is noted. No acute fracture or dislocation is noted. No soft tissue abnormality is seen. IMPRESSION: Mild hallux valgus deformity without acute abnormality. Electronically Signed   By: Inez Catalina M.D.   On: 10/05/2020 20:06    Procedures Procedures   Medications Ordered in ED Medications - No data to display  ED Course  I have reviewed the triage vital signs and the nursing notes.  Pertinent labs & imaging results that were available during my care of the patient were reviewed by me and considered in my medical decision making (see chart for details).  Patient seen and examined.  X-ray negative.  Will provide with crutches and postop shoe.  Discussed rice protocol, OTC meds.  Encourage PCP follow-up 1 week if not improving.   Vital signs reviewed and are as follows: BP 121/78 (BP Location: Right Arm)   Pulse 74   Temp 98.1 F (36.7 C) (Oral)   Resp 18   Ht '5\' 2"'$  (1.575 m)   Wt 57.2 kg   LMP 02/28/2016 (Exact Date)   SpO2 100%   BMI 23.05 kg/m      MDM Rules/Calculators/A&P                           Patient with right foot injury, lower extremity and foot are neurovascularly intact.  Do not suspect knee, hip, back injury.   Final Clinical Impression(s) / ED Diagnoses Final diagnoses:  Sprain of right foot, initial encounter    Rx / DC Orders ED Discharge Orders     None        Carlisle Cater, PA-C 10/05/20 2056    Fredia Sorrow, MD 10/09/20 [phone]

## 2020-10-05 NOTE — Discharge Instructions (Signed)
Please read and follow all provided instructions.  Your diagnoses today include:  1. Sprain of right foot, initial encounter     Tests performed today include: An x-ray of the affected area - does NOT show any broken bones Vital signs. See below for your results today.   Medications prescribed:  None  Take any prescribed medications only as directed.  Home care instructions:  Follow any educational materials contained in this packet Follow R.I.C.E. Protocol: R - rest your injury  I  - use ice on injury without applying directly to skin C - compress injury with bandage or splint E - elevate the injury as much as possible  Follow-up instructions: Please follow-up with your primary care provider if you continue to have significant pain in 1 week. In this case you may have a more severe injury that requires further care.   Return instructions:  Please return if your toes or feet are numb or tingling, appear gray or blue, or you have severe pain (also elevate the leg and loosen splint or wrap if you were given one) Please return to the Emergency Department if you experience worsening symptoms.  Please return if you have any other emergent concerns.  Additional Information:  Your vital signs today were: BP 121/78 (BP Location: Right Arm)   Pulse 74   Temp 98.1 F (36.7 C) (Oral)   Resp 18   Ht '5\' 2"'$  (1.575 m)   Wt 57.2 kg   LMP 02/28/2016 (Exact Date)   SpO2 100%   BMI 23.05 kg/m  If your blood pressure (BP) was elevated above 135/85 this visit, please have this repeated by your doctor within one month. -------------- If prescribed crutches for your injury: use crutches with non-weight bearing for the first few days. Then, you may walk as the pain allows, or as instructed. Start gradually with weight bearing on the affected side. Once you can walk pain free, then try jogging. When you can run forwards, then you can try moving side-to-side. If you cannot walk without crutches  in one week, you need a re-check. --------------

## 2020-10-05 NOTE — ED Triage Notes (Signed)
Pt states she fell/injured right foot ~1230pm-NAD-to triage in w/c

## 2020-10-13 ENCOUNTER — Other Ambulatory Visit: Payer: Self-pay

## 2020-10-13 ENCOUNTER — Inpatient Hospital Stay: Payer: Medicare Other | Attending: Hematology & Oncology

## 2020-10-13 VITALS — BP 128/81 | HR 66 | Temp 98.1°F | Resp 18

## 2020-10-13 DIAGNOSIS — Z85028 Personal history of other malignant neoplasm of stomach: Secondary | ICD-10-CM | POA: Insufficient documentation

## 2020-10-13 DIAGNOSIS — Z452 Encounter for adjustment and management of vascular access device: Secondary | ICD-10-CM | POA: Insufficient documentation

## 2020-10-13 DIAGNOSIS — C163 Malignant neoplasm of pyloric antrum: Secondary | ICD-10-CM

## 2020-10-13 MED ORDER — HEPARIN SOD (PORK) LOCK FLUSH 100 UNIT/ML IV SOLN
500.0000 [IU] | Freq: Once | INTRAVENOUS | Status: AC
  Administered 2020-10-13: 500 [IU] via INTRAVENOUS

## 2020-10-13 MED ORDER — SODIUM CHLORIDE 0.9% FLUSH
10.0000 mL | INTRAVENOUS | Status: DC | PRN
  Administered 2020-10-13: 10 mL via INTRAVENOUS

## 2020-10-13 NOTE — Patient Instructions (Signed)
Implanted Port Home Guide An implanted port is a device that is placed under the skin. It is usually placed in the chest. The device can be used to give IV medicine, to take blood, or for dialysis. You may have an implanted port if: You need IV medicine that would be irritating to the small veins in your hands or arms. You need IV medicines, such as antibiotics, for a long period of time. You need IV nutrition for a long period of time. You need dialysis. When you have a port, your health care provider can choose to use the port instead of veins in your arms for these procedures. You may have fewer limitations when using a port than you would if you used other types of long-term IVs, and you will likely be able to return to normal activities afteryour incision heals. An implanted port has two main parts: Reservoir. The reservoir is the part where a needle is inserted to give medicines or draw blood. The reservoir is round. After it is placed, it appears as a small, raised area under your skin. Catheter. The catheter is a thin, flexible tube that connects the reservoir to a vein. Medicine that is inserted into the reservoir goes into the catheter and then into the vein. How is my port accessed? To access your port: A numbing cream may be placed on the skin over the port site. Your health care provider will put on a mask and sterile gloves. The skin over your port will be cleaned carefully with a germ-killing soap and allowed to dry. Your health care provider will gently pinch the port and insert a needle into it. Your health care provider will check for a blood return to make sure the port is in the vein and is not clogged. If your port needs to remain accessed to get medicine continuously (constant infusion), your health care provider will place a clear bandage (dressing) over the needle site. The dressing and needle will need to be changed every week, or as told by your health care provider. What  is flushing? Flushing helps keep the port from getting clogged. Follow instructions from your health care provider about how and when to flush the port. Ports are usually flushed with saline solution or a medicine called heparin. The need for flushing will depend on how the port is used: If the port is only used from time to time to give medicines or draw blood, the port may need to be flushed: Before and after medicines have been given. Before and after blood has been drawn. As part of routine maintenance. Flushing may be recommended every 4-6 weeks. If a constant infusion is running, the port may not need to be flushed. Throw away any syringes in a disposal container that is meant for sharp items (sharps container). You can buy a sharps container from a pharmacy, or you can make one by using an empty hard plastic bottle with a cover. How long will my port stay implanted? The port can stay in for as long as your health care provider thinks it is needed. When it is time for the port to come out, a surgery will be done to remove it. The surgery will be similar to the procedure that was done to putthe port in. Follow these instructions at home:  Flush your port as told by your health care provider. If you need an infusion over several days, follow instructions from your health care provider about how to take   care of your port site. Make sure you: Wash your hands with soap and water before you change your dressing. If soap and water are not available, use alcohol-based hand sanitizer. Change your dressing as told by your health care provider. Place any used dressings or infusion bags into a plastic bag. Throw that bag in the trash. Keep the dressing that covers the needle clean and dry. Do not get it wet. Do not use scissors or sharp objects near the tube. Keep the tube clamped, unless it is being used. Check your port site every day for signs of infection. Check for: Redness, swelling, or  pain. Fluid or blood. Pus or a bad smell. Protect the skin around the port site. Avoid wearing bra straps that rub or irritate the site. Protect the skin around your port from seat belts. Place a soft pad over your chest if needed. Bathe or shower as told by your health care provider. The site may get wet as long as you are not actively receiving an infusion. Return to your normal activities as told by your health care provider. Ask your health care provider what activities are safe for you. Carry a medical alert card or wear a medical alert bracelet at all times. This will let health care providers know that you have an implanted port in case of an emergency. Get help right away if: You have redness, swelling, or pain at the port site. You have fluid or blood coming from your port site. You have pus or a bad smell coming from the port site. You have a fever. Summary Implanted ports are usually placed in the chest for long-term IV access. Follow instructions from your health care provider about flushing the port and changing bandages (dressings). Take care of the area around your port by avoiding clothing that puts pressure on the area, and by watching for signs of infection. Protect the skin around your port from seat belts. Place a soft pad over your chest if needed. Get help right away if you have a fever or you have redness, swelling, pain, drainage, or a bad smell at the port site. This information is not intended to replace advice given to you by your health care provider. Make sure you discuss any questions you have with your healthcare provider. Document Revised: 06/24/2019 Document Reviewed: 06/24/2019 Elsevier Patient Education  2022 Elsevier Inc.  

## 2020-10-28 ENCOUNTER — Other Ambulatory Visit: Payer: Self-pay | Admitting: *Deleted

## 2020-10-28 ENCOUNTER — Ambulatory Visit: Payer: Medicare Other | Admitting: Obstetrics & Gynecology

## 2020-10-28 DIAGNOSIS — C163 Malignant neoplasm of pyloric antrum: Secondary | ICD-10-CM

## 2020-10-28 DIAGNOSIS — C169 Malignant neoplasm of stomach, unspecified: Secondary | ICD-10-CM

## 2020-10-28 MED ORDER — HYDROCODONE-ACETAMINOPHEN 5-325 MG PO TABS: 1.0000 | ORAL_TABLET | Freq: Four times a day (QID) | ORAL | 0 refills | Status: DC | PRN

## 2020-11-09 ENCOUNTER — Telehealth: Payer: Self-pay | Admitting: *Deleted

## 2020-11-09 NOTE — Telephone Encounter (Signed)
Received a call from patient stating that she has had increased pain over the week and hydrocodone is not helping with this pain.  Reviewed with Dr Marin Olp and advised patient to call her primary care physician to be evaluated for this pain as this does not appear to be related to her cancer diagnosis.  Left message on patients personal cell phone.

## 2020-11-17 ENCOUNTER — Inpatient Hospital Stay: Payer: Medicare Other | Attending: Hematology & Oncology

## 2020-11-17 ENCOUNTER — Other Ambulatory Visit: Payer: Self-pay

## 2020-11-17 VITALS — BP 124/66 | HR 70 | Temp 98.4°F | Resp 17

## 2020-11-17 DIAGNOSIS — Z95828 Presence of other vascular implants and grafts: Secondary | ICD-10-CM

## 2020-11-17 DIAGNOSIS — Z85028 Personal history of other malignant neoplasm of stomach: Secondary | ICD-10-CM | POA: Diagnosis present

## 2020-11-17 DIAGNOSIS — D500 Iron deficiency anemia secondary to blood loss (chronic): Secondary | ICD-10-CM

## 2020-11-17 MED ORDER — SODIUM CHLORIDE 0.9% FLUSH
10.0000 mL | Freq: Once | INTRAVENOUS | Status: AC
  Administered 2020-11-17: 10 mL via INTRAVENOUS

## 2020-11-17 MED ORDER — HEPARIN SOD (PORK) LOCK FLUSH 100 UNIT/ML IV SOLN
500.0000 [IU] | Freq: Once | INTRAVENOUS | Status: AC
  Administered 2020-11-17: 500 [IU] via INTRAVENOUS

## 2020-11-17 NOTE — Patient Instructions (Signed)

## 2020-11-26 ENCOUNTER — Telehealth: Payer: Self-pay | Admitting: *Deleted

## 2020-11-26 NOTE — Telephone Encounter (Signed)
Call received from patient stating that she has been vomiting since Saturday and that last BM was yesterday, 11/25/20 and was normal.  Dr. Marin Olp notified and order received for pt to contact her GI MD regarding vomiting issues.  Call placed back to patient and message left to inform pt to contact her GI MD regarding vomiting.  Instructed pt to call office back with any other questions or concerns.

## 2020-11-27 ENCOUNTER — Other Ambulatory Visit: Payer: Self-pay | Admitting: *Deleted

## 2020-11-27 DIAGNOSIS — C163 Malignant neoplasm of pyloric antrum: Secondary | ICD-10-CM

## 2020-11-27 DIAGNOSIS — C169 Malignant neoplasm of stomach, unspecified: Secondary | ICD-10-CM

## 2020-11-27 MED ORDER — HYDROCODONE-ACETAMINOPHEN 5-325 MG PO TABS: 1.0000 | ORAL_TABLET | Freq: Four times a day (QID) | ORAL | 0 refills | Status: DC | PRN

## 2020-12-11 ENCOUNTER — Inpatient Hospital Stay: Payer: Medicare Other

## 2020-12-11 ENCOUNTER — Inpatient Hospital Stay: Payer: Medicare Other | Admitting: Family

## 2020-12-29 ENCOUNTER — Other Ambulatory Visit: Payer: Self-pay

## 2020-12-29 ENCOUNTER — Other Ambulatory Visit (HOSPITAL_BASED_OUTPATIENT_CLINIC_OR_DEPARTMENT_OTHER): Payer: Self-pay

## 2020-12-29 DIAGNOSIS — C169 Malignant neoplasm of stomach, unspecified: Secondary | ICD-10-CM

## 2020-12-29 DIAGNOSIS — C163 Malignant neoplasm of pyloric antrum: Secondary | ICD-10-CM

## 2020-12-29 MED ORDER — HYDROCODONE-ACETAMINOPHEN 5-325 MG PO TABS
1.0000 | ORAL_TABLET | Freq: Four times a day (QID) | ORAL | 0 refills | Status: DC | PRN
  Filled 2020-12-29: qty 60, 15d supply, fill #0

## 2020-12-29 NOTE — Telephone Encounter (Signed)
Pt called requesting refill on hydrocodone, refill request sent to md

## 2020-12-30 ENCOUNTER — Inpatient Hospital Stay: Payer: Medicare Other

## 2020-12-30 ENCOUNTER — Other Ambulatory Visit: Payer: Self-pay | Admitting: Gastroenterology

## 2020-12-30 ENCOUNTER — Encounter: Payer: Self-pay | Admitting: Gastroenterology

## 2020-12-30 ENCOUNTER — Other Ambulatory Visit: Payer: Self-pay

## 2020-12-30 ENCOUNTER — Ambulatory Visit (INDEPENDENT_AMBULATORY_CARE_PROVIDER_SITE_OTHER): Payer: Medicare Other | Admitting: Gastroenterology

## 2020-12-30 ENCOUNTER — Inpatient Hospital Stay: Payer: Medicare Other | Attending: Hematology & Oncology

## 2020-12-30 ENCOUNTER — Other Ambulatory Visit (HOSPITAL_BASED_OUTPATIENT_CLINIC_OR_DEPARTMENT_OTHER): Payer: Self-pay

## 2020-12-30 VITALS — BP 106/70 | HR 65 | Resp 17

## 2020-12-30 VITALS — BP 106/70 | Ht 62.0 in | Wt 134.5 lb

## 2020-12-30 DIAGNOSIS — R112 Nausea with vomiting, unspecified: Secondary | ICD-10-CM | POA: Diagnosis present

## 2020-12-30 DIAGNOSIS — D131 Benign neoplasm of stomach: Secondary | ICD-10-CM | POA: Diagnosis not present

## 2020-12-30 DIAGNOSIS — K5909 Other constipation: Secondary | ICD-10-CM | POA: Insufficient documentation

## 2020-12-30 DIAGNOSIS — Z95828 Presence of other vascular implants and grafts: Secondary | ICD-10-CM

## 2020-12-30 DIAGNOSIS — D509 Iron deficiency anemia, unspecified: Secondary | ICD-10-CM | POA: Insufficient documentation

## 2020-12-30 DIAGNOSIS — Z85028 Personal history of other malignant neoplasm of stomach: Secondary | ICD-10-CM | POA: Diagnosis present

## 2020-12-30 DIAGNOSIS — Z452 Encounter for adjustment and management of vascular access device: Secondary | ICD-10-CM | POA: Insufficient documentation

## 2020-12-30 MED ORDER — SODIUM CHLORIDE 0.9% FLUSH
10.0000 mL | Freq: Once | INTRAVENOUS | Status: AC
  Administered 2020-12-30: 10 mL via INTRAVENOUS

## 2020-12-30 MED ORDER — HEPARIN SOD (PORK) LOCK FLUSH 100 UNIT/ML IV SOLN
500.0000 [IU] | Freq: Once | INTRAVENOUS | Status: AC
  Administered 2020-12-30: 500 [IU] via INTRAVENOUS

## 2020-12-30 MED ORDER — PANTOPRAZOLE SODIUM 40 MG PO TBEC
40.0000 mg | DELAYED_RELEASE_TABLET | Freq: Every day | ORAL | 3 refills | Status: DC
  Filled 2020-12-30 – 2021-01-18 (×2): qty 90, 90d supply, fill #0

## 2020-12-30 NOTE — Patient Instructions (Addendum)
If you are age 54 or older, your body mass index should be between 23-30. Your Body mass index is 24.6 kg/m. If this is out of the aforementioned range listed, please consider follow up with your Primary Care Provider.  If you are age 27 or younger, your body mass index should be between 19-25. Your Body mass index is 24.6 kg/m. If this is out of the aformentioned range listed, please consider follow up with your Primary Care Provider.   ________________________________________________________  The  GI providers would like to encourage you to use [facility] to communicate with providers for non-urgent requests or questions.  Due to long hold times on the telephone, sending your provider a message by [facility] may be a faster and more efficient way to get a response.  Please allow 48 business hours for a response.  Please remember that this is for non-urgent requests.  _______________________________________________________  Please go to [facility] for lab work.   We have sent the following medications to your pharmacy for you to pick up at your convenience: Protonix  Continue phenegan   You have been scheduled for a CT [facility] the abdomen [facility] [place] [address] [address] [facility]   You are scheduled on 01-08-2021 at 930am. You should arrive 15 minutes prior to your appointment time for registration. Please follow the written instructions below on the day of your exam:  WARNING: IF YOU ARE ALLERGIC TO IODINE/X-RAY DYE, PLEASE NOTIFY RADIOLOGY IMMEDIATELY AT [phone]! YOU WILL BE GIVEN A 13 HOUR PREMEDICATION PREP.  1) Do not eat or drink anything after 530am (4 hours prior to your test) 2) You have been given 2 bottles of oral contrast to drink. The solution may taste better if refrigerated, but do NOT add ice or any other liquid to this solution. Shake well before drinking.    Drink 1 bottle of contrast @ 730am (2 hours  prior to your exam)  Drink 1 bottle of contrast @ 830am (1 hour prior to your exam)  You may take any medications as prescribed with a small amount of water, if necessary. If you take any of the following medications: METFORMIN, GLUCOPHAGE, GLUCOVANCE, AVANDAMET, RIOMET, FORTAMET, [place] MET, JANUMET, GLUMETZA or METAGLIP, you MAY be asked to HOLD this medication 48 hours AFTER the exam.  The purpose of you drinking the oral contrast is to aid in the visualization of your intestinal tract. The contrast solution may cause some diarrhea. Depending on your individual set of symptoms, you may also receive an intravenous injection of x-ray contrast/dye. Plan on being at [facility] for 30 minutes or longer, depending on the type of exam you are having performed.  This test typically takes 30-45 minutes to complete.  If you have any questions regarding your exam or if you need to reschedule, you may call the CT department at [phone] between the hours of 8:00 am and 5:00 pm, Monday-Friday.  ________________________________________________________________________   Thank you,  Dr. Jackquline Denmark

## 2020-12-30 NOTE — Patient Instructions (Signed)

## 2020-12-30 NOTE — Progress Notes (Signed)
Chief Complaint: N/V  Referring Provider:  No ref. provider found      ASSESSMENT AND PLAN;   #1. N/V (resolved)  Prev neg CT AP 12/19/2018 for recurrence of gastric cancer.  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 Nl TSH. EGD Jan 2021 with 2 cm Barrett's esophagus, small gastric remnant with angular takeoff of afferent/efferent limbs. Refused revision (Dr Barry Dienes)  #2. Stage IIA (T1a, N2, M0) gastric AdenoCa s/p BII gastrectomy/feeding J tube July 2015 followed by neoadjuvant chemoradiation (Dr Barry Dienes). Neg CT AP 12/19/2018. EGD 01/2018, 02/2019 - as below. Neg for recurrence.  #3. IDA d/t Billroth II gastrectomy.  #4. Chronic constipation.  Refuses screening colon.  Plan:  -Phenegren 52m po BID to continue.  Sedation/fall precautions. -CBC, CMP, LDH, iron studies, HBA1c -CT Chest/A/P with PO/IV contrast (RE: H/O Gastric Ca) -Change Omeprazole to protonix 428mPO QD #90, 4 refiils -Quit smoking. -Stop marijauana use.  -I have encouraged her to eat small but more frequent meals 6-7 times/day.   -Minimize narcotics. -Take exlax everyday -Records Dr ByNorman ClayV note.   HPI:    Paula [name] is a [age].o. female  With H/O stage II gastric AdenoCa s/p Billroth II gastrectomy 2015 followed by neoadjuvant chemoradiation.  Has been having persistent abdominal pain, intermittent N/V ever since. Neg CT AP as below 11/2018. EGD 02/2019 showed small gastric remnant with moderate gastritis (neg Bx), angular takeoff of the efferent limb.  2 cm Barrett's esophagus without dysplasia.  Refused revision  Had N/V - resolved now.  No odynophagia or dysphagia.  No GI bleeding.  Phenergan helps with nausea/vomiting.  She takes it twice a day.  Overall has been eating better.  She could not tolerate Ensure.  She is currently taking milkshakes 2/day.  Has gained weight as below.  Has constipation related to narcotics.  BMs 1/week which are hard to pass.   She does have associated abdominal bloating.  Has been taking Ex-Lax once per week.  Continues to smoke.  No nonsteroidals.  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    From previous notes:  Adm to [facility] with UGI bleed s/p EGD 12/09/2019 by Dr. ShKirby Crigleras negative except chronic findings.  She was managed conservatively.  CT Abdo/pelvis in addition to above findings did show pancreatic atrophy.  A trial of pancreatic enzymes was tried without any improvement.    Wt Readings from Last 3 Encounters:  12/30/20 134 lb 8 oz (61 kg)  10/05/20 126 lb (57.2 kg)  05/12/20 110 lb 8 oz (50.1 kg)     Previous GI procedures:  Underwent CT Abdo/pelvis on 12/19/2018 which did not show any acute abnormalities.  No recurrence.  EGD 03/06/2019 - Moderate gastritis. (Likely etiology of recurrent anemia). - Likely 2 cm Barrett's esophagus (biopsied) - Patent Billroth II gastrojejunostomy with normal afferent/efferent limbs. Did have angular takeoff. -No recurrence of gastric cancer. -Bx: Eso: Barrett's esophagus without dysplasia. Gastric: Gastritis with negative [facility].  EGD 02/07/2018 [facility] -Salmon-colored mucosa extending from 37 to 38 cm. -Billroth II gastrectomy.  Angular takeoff of both limbs. -Gastritis (Bx- neg) -Single 3 mm gastric polyp at anastomosis (Bx-hyperplastic polyp) -Small amount of food residue in stomach.  Bilious gastric fluid. -Dilated lacteals in the duodenum (Bx- neg, neg for celiac) -No recurrence.  CTA chest/Abdo/pelvis 02/23/2019 1. No acute thoracic, abdominal or pelvic pathology. Specifically, no evidence for aortic dissection or aneurysm. 2. Chronic findings as above including Port-A-Cath, postsurgical changes  Refused colonoscopy.  I talked to her today as well. Past Medical History:  Diagnosis Date   Anxiety    per pt   Arthritis    back   Barrett esophagus    Costochondritis    Esophageal reflux    On omeprazole   Family  history of cancer    Headache(784.0) 2011   Migraines   Heart murmur    never caused any problems   History of hiatal hernia    History of radiation therapy 05/12/14- 06/17/14   post-op gastric region/ nodes/ 45 Gy in 25 fractions.    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 [id number]   Wears partial dentures    upper dentures    Past Surgical History:  Procedure Laterality Date   CESAREAN SECTION     x 2   ESOPHAGOGASTRODUODENOSCOPY N/A 09/16/2013   Procedure: ESOPHAGOGASTRODUODENOSCOPY (EGD);  Surgeon: Wonda Horner, MD;  Location: [facility];  Service: Endoscopy;  Laterality: N/A;   ESOPHAGOGASTRODUODENOSCOPY  12/09/2019   [facility]   GASTROSTOMY     r/t stomach cancer   IR CV LINE INJECTION  08/30/2017   IR FLUORO GUIDE PORT INSERTION RIGHT  03/21/2017   IR US GUIDE VASC ACCESS RIGHT  03/21/2017   LAPAROSCOPY N/A 09/20/2013   Procedure: DIAGNOSTIC LAPAROSCOPY,DISTAL GASTRECTOMY AND FEEDING GASTROJEJUNOSTOMY;  Surgeon: [name], MD;  Location: [facility];  Service: General;  Laterality: N/A;   OOPHORECTOMY     Around 1985 left ovary removal   PORTACATH PLACEMENT Left 11/18/2013   Procedure: INSERTION PORT-A-CATH;  Surgeon: [name], MD;  Location: [facility];  Service: General;  Laterality: Left;   TONSILLECTOMY     Around 1994   TUBAL LIGATION     UNILATERAL SALPINGECTOMY Left 11/09/2017   Procedure: UNILATERAL SALPINGECTOMY;  Surgeon: Osborne Oman, MD;  Location: [facility];  Service: Gynecology;  Laterality: Left;   UPPER GASTROINTESTINAL ENDOSCOPY     VAGINAL HYSTERECTOMY Left 11/09/2017   Procedure: HYSTERECTOMY VAGINAL WITH LEFT SALPINGECTOMY;  Surgeon: Osborne Oman, MD;  Location: [facility];   Service: Gynecology;  Laterality: Left;    Family History  Problem Relation Age of Onset   Hypertension Mother    Diabetes Mellitus II Mother    Other Mother        hx of hysterectomy for unspecified reason   Hypertension Sister    Hypertension Brother    Heart failure Maternal Grandmother    Heart Problems Maternal Grandmother    Hypertension Maternal Grandfather    Diabetes Maternal Grandfather    Colon cancer Paternal Uncle        dx. late 50s-early 60s   Gastric cancer Cousin 61   Colon cancer Cousin        dx. early-late 18s; (father also had colon cancer)   Colon cancer Paternal Uncle        dx. late 50s-early 60s   Lung cancer Paternal Uncle        dx. 20s; smoker   Colon polyps Paternal Uncle        unknown number   Heart attack Maternal Aunt    Heart  Problems Paternal Aunt    Heart attack Paternal Grandfather    Colon cancer Paternal Grandfather        dx. 45s-60s   Colon polyps Brother        1 maternal half-brother had approx 3 polyps   Breast cancer Cousin 64   Breast cancer Cousin        dx. early 62s or earlier (father had colon cancer)   Lung cancer Other        PGF's brothers (x4); dx. later in life; lim info   Esophageal cancer Neg Hx    Rectal cancer Neg Hx     Social History   Tobacco Use   Smoking status: Former    Packs/day: 1.00    Years: 16.00    Pack years: 16.00    Types: Cigarettes    Quit date: 02/21/1998    Years since quitting: 22.8   Smokeless tobacco: Never   Tobacco comments:    0.5-1.0 ppd for 16 yrs  Vaping Use   Vaping Use: Never used  Substance Use Topics   Alcohol use: Not Currently   Drug use: Not Currently    Current Outpatient Medications  Medication Sig Dispense Refill   fluticasone (FLONASE) 50 MCG/ACT nasal spray Place 1 spray into both nostrils daily. 16 g 2   HYDROcodone-acetaminophen (NORCO/VICODIN) 5-325 MG tablet Take 1 tablet by mouth every 6 (six) hours as needed for moderate pain. 60 tablet 0    lidocaine-prilocaine (EMLA) cream Apply 1 application topically as needed. 30 g 6   Multiple Vitamins-Minerals (WOMENS MULTIVITAMIN PO) Take by mouth.     promethazine (PHENERGAN) 25 MG suppository Place 1 suppository (25 mg total) rectally every 8 (eight) hours as needed for nausea or vomiting. 6 each 0   promethazine (PHENERGAN) 25 MG tablet TAKE 1 TABLET BY MOUTH EVERY 6 HOURS AS NEEDED FOR NAUSEA & VOMITING 90 tablet 2   gabapentin (NEURONTIN) 600 MG tablet Take 1 tablet (600 mg total) by mouth 2 (two) times daily. 60 tablet 1   nitroGLYCERIN (NITROSTAT) 0.4 MG SL tablet Place 1 tablet (0.4 mg total) under the tongue every 5 (five) minutes as needed for chest pain. (Patient not taking: No sig reported) 25 tablet 2   No current facility-administered medications for this visit.   Facility-Administered Medications Ordered in Other Visits  Medication Dose Route Frequency Provider Last Rate Last Admin   sodium chloride flush (NS) 0.9 % injection 10 mL  10 mL Intravenous PRN Volanda Napoleon, MD   10 mL at 01/03/18 1316   sodium chloride flush (NS) 0.9 % injection 10 mL  10 mL Intravenous PRN Volanda Napoleon, MD   10 mL at 09/10/19 1114    Allergies  Allergen Reactions   Tape Itching    Ok with adhesive and paper tape     Review of Systems:  neg     Physical Exam:    BP 106/70   Ht 5' 2"  (1.575 m)   Wt 134 lb 8 oz (61 kg)   LMP 02/28/2016 (Exact Date)   BMI 24.60 kg/m  Wt Readings from Last 3 Encounters:  12/30/20 134 lb 8 oz (61 kg)  10/05/20 126 lb (57.2 kg)  05/12/20 110 lb 8 oz (50.1 kg)   Constitutional: Had cachexia.  In no acute distress. Psychiatric: Normal mood and affect. Behavior is normal. HEENT: Pupils normal.  Conjunctivae are normal. No scleral icterus.  Cardiovascular: Normal rate, regular rhythm. No edema Pulmonary/chest: Effort  normal and breath sounds normal. No wheezing, rales or rhonchi. Abdominal: Soft, nondistended. Nontender. Bowel sounds active  throughout. There are no masses palpable. No hepatomegaly.  Well-healed surgical scars. Neurological: Alert and oriented to person place and time. Skin: Skin is warm and dry. No rashes noted.  Data Reviewed: I have personally reviewed following labs and imaging studies  CBC: CBC Latest Ref Rng & Units 08/12/2020 05/12/2020 02/18/2020  WBC 4.0 - 10.5 K/uL 4.2 4.0 3.5(L)  Hemoglobin 12.0 - 15.0 g/dL 10.3(L) 10.5(L) 9.9(L)  Hematocrit 36.0 - 46.0 % 32.7(L) 32.9(L) 31.8(L)  Platelets 150 - 400 K/uL 242 265 218    CMP: CMP Latest Ref Rng & Units 08/12/2020 05/12/2020 02/18/2020  Glucose 70 - 99 mg/dL 125(H) 102(H) 135(H)  BUN 6 - 20 mg/dL 19 12 14   Creatinine 0.44 - 1.00 mg/dL 0.85 0.98 0.90  Sodium 135 - 145 mmol/L 138 140 139  Potassium 3.5 - 5.1 mmol/L 4.0 4.0 3.8  Chloride 98 - 111 mmol/L 102 104 103  CO2 22 - 32 mmol/L 29 33(H) 30  Calcium 8.9 - 10.3 mg/dL 9.0 8.8(L) 9.5  Total Protein 6.5 - 8.1 g/dL 6.4(L) 6.4(L) 6.0(L)  Total Bilirubin 0.3 - 1.2 mg/dL 0.6 0.5 0.6  Alkaline Phos 38 - 126 U/L 70 67 46  AST 15 - 41 U/L 20 17 13(L)  ALT 0 - 44 U/L 12 12 9    Hepatic Function Latest Ref Rng & Units 08/12/2020 05/12/2020 02/18/2020  Total Protein 6.5 - 8.1 g/dL 6.4(L) 6.4(L) 6.0(L)  Albumin 3.5 - 5.0 g/dL 3.9 3.8 3.7  AST 15 - 41 U/L 20 17 13(L)  ALT 0 - 44 U/L 12 12 9   Alk Phosphatase 38 - 126 U/L 70 67 46  Total Bilirubin 0.3 - 1.2 mg/dL 0.6 0.5 0.6     Carmell Austria, MD 12/30/2020, 2:09 PM  Cc: No ref. provider found

## 2020-12-31 ENCOUNTER — Ambulatory Visit: Payer: Medicare Other

## 2021-01-04 ENCOUNTER — Inpatient Hospital Stay: Payer: Medicare Other

## 2021-01-04 ENCOUNTER — Inpatient Hospital Stay: Payer: Medicare Other | Admitting: Family

## 2021-01-06 ENCOUNTER — Telehealth (HOSPITAL_BASED_OUTPATIENT_CLINIC_OR_DEPARTMENT_OTHER): Payer: Self-pay

## 2021-01-06 ENCOUNTER — Telehealth: Payer: Self-pay

## 2021-01-06 NOTE — Telephone Encounter (Signed)
LVM for patient to call back  Called patient and labs arent in because according to [facility] at [facility] there is an ambigious code and there needs to looked at more. Labs cant be guaranteed to have by Friday as of now. Call number for [facility] is [phone]

## 2021-01-07 LAB — COMPREHENSIVE METABOLIC PANEL
ALT: 13 IU/L (ref 0–32)
AST: 18 IU/L (ref 0–40)
Albumin/Globulin Ratio: 2.1 (ref 1.2–2.2)
Albumin: 4.2 g/dL (ref 3.8–4.9)
Alkaline Phosphatase: 81 IU/L (ref 44–121)
BUN/Creatinine Ratio: 16 (ref 9–23)
BUN: 14 mg/dL (ref 6–24)
Bilirubin Total: <0.2 mg/dL (ref 0.0–1.2)
CO2: 25 mmol/L (ref 20–29)
Calcium: 8.5 mg/dL — ABNORMAL LOW (ref 8.7–10.2)
Chloride: 102 mmol/L (ref 96–106)
Creatinine, Ser: 0.87 mg/dL (ref 0.57–1.00)
Globulin, Total: 2 g/dL (ref 1.5–4.5)
Glucose: 141 mg/dL — ABNORMAL HIGH (ref 70–99)
Potassium: 4.4 mmol/L (ref 3.5–5.2)
Sodium: 140 mmol/L (ref 134–144)
Total Protein: 6.2 g/dL (ref 6.0–8.5)
eGFR: 79 mL/min/{1.73_m2} (ref >59)

## 2021-01-07 LAB — CBC/DIFF AMBIGUOUS DEFAULT
Basophils Absolute: 0 10*3/uL (ref 0.0–0.2)
Basos: 1 %
EOS (ABSOLUTE): 0.1 10*3/uL (ref 0.0–0.4)
Eos: 2 %
Hematocrit: 34.4 % (ref 34.0–46.6)
Hemoglobin: 10.7 g/dL — ABNORMAL LOW (ref 11.1–15.9)
Immature Grans (Abs): 0 10*3/uL (ref 0.0–0.1)
Immature Granulocytes: 0 %
Lymphocytes Absolute: 1.9 10*3/uL (ref 0.7–3.1)
Lymphs: 32 %
MCH: 28 pg (ref 26.6–33.0)
MCHC: 31.1 g/dL — ABNORMAL LOW (ref 31.5–35.7)
MCV: 90 fL (ref 79–97)
Monocytes Absolute: 0.4 10*3/uL (ref 0.1–0.9)
Monocytes: 7 %
Neutrophils Absolute: 3.4 10*3/uL (ref 1.4–7.0)
Neutrophils: 58 %
Platelets: 227 10*3/uL (ref 150–450)
RBC: 3.82 x10E6/uL (ref 3.77–5.28)
RDW: 13 % (ref 11.7–15.4)
WBC: 5.8 10*3/uL (ref 3.4–10.8)

## 2021-01-07 LAB — IRON AND TIBC
Iron Saturation: 20 % (ref 15–55)
Iron: 51 ug/dL (ref 27–159)
Total Iron Binding Capacity: 256 ug/dL (ref 250–450)
UIBC: 205 ug/dL (ref 131–425)

## 2021-01-07 LAB — LACTATE DEHYDROGENASE: LDH: 145 IU/L (ref 119–226)

## 2021-01-07 LAB — SPECIMEN STATUS REPORT

## 2021-01-07 LAB — FERRITIN: Ferritin: 74 ng/mL (ref 15–150)

## 2021-01-07 NOTE — Telephone Encounter (Signed)
Labs received this am via fax from [facility]. Normal Bun-14 and Creatinine0.87. sent labs [facility]. Patient advised okay to drink contrast for CT. The patient verbalized understanding

## 2021-01-08 ENCOUNTER — Other Ambulatory Visit (HOSPITAL_BASED_OUTPATIENT_CLINIC_OR_DEPARTMENT_OTHER): Payer: Self-pay

## 2021-01-08 ENCOUNTER — Ambulatory Visit (HOSPITAL_BASED_OUTPATIENT_CLINIC_OR_DEPARTMENT_OTHER): Admission: RE | Admit: 2021-01-08 | Payer: Medicare Other | Source: Ambulatory Visit

## 2021-01-12 NOTE — Telephone Encounter (Signed)
Fax number for PCP [phone] attn: Peter Congo

## 2021-01-13 ENCOUNTER — Telehealth: Payer: Self-pay | Admitting: *Deleted

## 2021-01-13 ENCOUNTER — Ambulatory Visit (HOSPITAL_BASED_OUTPATIENT_CLINIC_OR_DEPARTMENT_OTHER): Payer: Medicare Other

## 2021-01-13 NOTE — Telephone Encounter (Signed)
Per secure chat Paula [name] - called and gave upcoming appointment - confirmed

## 2021-01-18 ENCOUNTER — Inpatient Hospital Stay: Payer: Medicare Other

## 2021-01-18 ENCOUNTER — Ambulatory Visit (HOSPITAL_BASED_OUTPATIENT_CLINIC_OR_DEPARTMENT_OTHER)
Admission: RE | Admit: 2021-01-18 | Discharge: 2021-01-18 | Disposition: A | Discharge status: Home / Self Care | Payer: Medicare Other | Source: Ambulatory Visit | Attending: Gastroenterology | Admitting: Gastroenterology

## 2021-01-18 ENCOUNTER — Encounter (HOSPITAL_BASED_OUTPATIENT_CLINIC_OR_DEPARTMENT_OTHER): Payer: Self-pay

## 2021-01-18 ENCOUNTER — Other Ambulatory Visit: Payer: Self-pay

## 2021-01-18 ENCOUNTER — Other Ambulatory Visit (HOSPITAL_BASED_OUTPATIENT_CLINIC_OR_DEPARTMENT_OTHER): Payer: Self-pay

## 2021-01-18 DIAGNOSIS — D131 Benign neoplasm of stomach: Secondary | ICD-10-CM

## 2021-01-18 DIAGNOSIS — R112 Nausea with vomiting, unspecified: Secondary | ICD-10-CM

## 2021-01-18 DIAGNOSIS — K5909 Other constipation: Secondary | ICD-10-CM | POA: Insufficient documentation

## 2021-01-18 DIAGNOSIS — D509 Iron deficiency anemia, unspecified: Secondary | ICD-10-CM | POA: Insufficient documentation

## 2021-01-18 DIAGNOSIS — Z95828 Presence of other vascular implants and grafts: Secondary | ICD-10-CM

## 2021-01-18 MED ORDER — IOHEXOL 300 MG/ML  SOLN
100.0000 mL | Freq: Once | INTRAMUSCULAR | Status: AC | PRN
  Administered 2021-01-18: 14:00:00 100 mL via INTRAVENOUS

## 2021-01-18 MED ORDER — HEPARIN SOD (PORK) LOCK FLUSH 100 UNIT/ML IV SOLN
500.0000 [IU] | Freq: Once | INTRAVENOUS | Status: AC
  Administered 2021-01-18: 14:00:00 500 [IU] via INTRAVENOUS

## 2021-01-18 MED ORDER — SODIUM CHLORIDE 0.9% FLUSH
10.0000 mL | Freq: Once | INTRAVENOUS | Status: AC
  Administered 2021-01-18: 14:00:00 10 mL via INTRAVENOUS

## 2021-01-18 NOTE — Patient Instructions (Signed)

## 2021-01-19 ENCOUNTER — Other Ambulatory Visit: Payer: Self-pay | Admitting: Obstetrics & Gynecology

## 2021-01-19 DIAGNOSIS — N951 Menopausal and female climacteric states: Secondary | ICD-10-CM

## 2021-01-21 ENCOUNTER — Encounter: Payer: Self-pay | Admitting: Family

## 2021-01-21 MED ORDER — GABAPENTIN 600 MG PO TABS: 600.0000 mg | ORAL_TABLET | Freq: Two times a day (BID) | ORAL | 1 refills | Status: DC

## 2021-01-25 ENCOUNTER — Telehealth: Payer: Self-pay

## 2021-01-25 NOTE — Telephone Encounter (Signed)
Patient stated she wanted to know more about the scarring on her lungs that were noted on her CT scan from 11-28 and also for her lab work her glucose level was 141. She said she was fasting when she had her lab work done. I told her she probably might need to talk to her PCP regarding her glucose to make them aware. Please advise

## 2021-01-26 NOTE — Telephone Encounter (Signed)
LVM for patient to call back. She needs to call her PCP to set up an appointment regarding recent CT and lab work pertaining to her glucose levels tend.  Patient need to update her PCP so that I can sent the information to her PCP

## 2021-01-26 NOTE — Telephone Encounter (Signed)
Good idea. She needs to talk to her PCP regarding both. Please send copy of the CT to her family physician RG

## 2021-01-28 ENCOUNTER — Other Ambulatory Visit: Payer: Self-pay | Admitting: *Deleted

## 2021-01-28 DIAGNOSIS — C169 Malignant neoplasm of stomach, unspecified: Secondary | ICD-10-CM

## 2021-01-28 DIAGNOSIS — C163 Malignant neoplasm of pyloric antrum: Secondary | ICD-10-CM

## 2021-01-28 MED ORDER — HYDROCODONE-ACETAMINOPHEN 5-325 MG PO TABS: 1.0000 | ORAL_TABLET | Freq: Four times a day (QID) | ORAL | 0 refills | Status: DC | PRN

## 2021-01-28 NOTE — Telephone Encounter (Signed)
CT and labs sent to Dr Glean Salvo at [facility] [phone] and fax [phone]  Patient is aware she needs to call her PCP regarding this.   Successful fax and PCP updated in chart

## 2021-01-29 ENCOUNTER — Telehealth: Payer: Self-pay | Admitting: *Deleted

## 2021-01-29 NOTE — Telephone Encounter (Signed)
Per scheduling message Paula [name] - called and lvm of upcoming appointments - requested call back to confirm 

## 2021-02-19 ENCOUNTER — Other Ambulatory Visit: Payer: Self-pay | Admitting: *Deleted

## 2021-02-19 MED ORDER — LIDOCAINE-PRILOCAINE 2.5-2.5 % EX CREA: 1.0000 "application " | TOPICAL_CREAM | CUTANEOUS | 6 refills | Status: DC | PRN

## 2021-03-01 ENCOUNTER — Other Ambulatory Visit: Payer: Self-pay | Admitting: *Deleted

## 2021-03-01 DIAGNOSIS — C163 Malignant neoplasm of pyloric antrum: Secondary | ICD-10-CM

## 2021-03-01 DIAGNOSIS — C169 Malignant neoplasm of stomach, unspecified: Secondary | ICD-10-CM

## 2021-03-01 MED ORDER — HYDROCODONE-ACETAMINOPHEN 5-325 MG PO TABS: 1.0000 | ORAL_TABLET | Freq: Four times a day (QID) | ORAL | 0 refills | Status: DC | PRN

## 2021-03-12 ENCOUNTER — Other Ambulatory Visit (HOSPITAL_BASED_OUTPATIENT_CLINIC_OR_DEPARTMENT_OTHER): Payer: Self-pay

## 2021-03-12 ENCOUNTER — Inpatient Hospital Stay: Payer: Medicare Other | Attending: Hematology & Oncology

## 2021-03-12 ENCOUNTER — Inpatient Hospital Stay: Payer: Medicare Other

## 2021-03-12 ENCOUNTER — Other Ambulatory Visit: Payer: Self-pay

## 2021-03-12 ENCOUNTER — Inpatient Hospital Stay (HOSPITAL_BASED_OUTPATIENT_CLINIC_OR_DEPARTMENT_OTHER): Payer: Medicare Other | Admitting: Family

## 2021-03-12 ENCOUNTER — Encounter: Payer: Self-pay | Admitting: Family

## 2021-03-12 VITALS — BP 122/69 | HR 74 | Temp 97.7°F | Resp 18

## 2021-03-12 VITALS — Ht 61.0 in | Wt 136.0 lb

## 2021-03-12 DIAGNOSIS — C169 Malignant neoplasm of stomach, unspecified: Secondary | ICD-10-CM

## 2021-03-12 DIAGNOSIS — D509 Iron deficiency anemia, unspecified: Secondary | ICD-10-CM | POA: Insufficient documentation

## 2021-03-12 DIAGNOSIS — Z923 Personal history of irradiation: Secondary | ICD-10-CM | POA: Insufficient documentation

## 2021-03-12 DIAGNOSIS — D500 Iron deficiency anemia secondary to blood loss (chronic): Secondary | ICD-10-CM

## 2021-03-12 DIAGNOSIS — Z85028 Personal history of other malignant neoplasm of stomach: Secondary | ICD-10-CM | POA: Insufficient documentation

## 2021-03-12 DIAGNOSIS — C163 Malignant neoplasm of pyloric antrum: Secondary | ICD-10-CM

## 2021-03-12 DIAGNOSIS — Z95828 Presence of other vascular implants and grafts: Secondary | ICD-10-CM

## 2021-03-12 LAB — CMP (CANCER CENTER ONLY)
ALT: 12 U/L (ref 0–44)
AST: 18 U/L (ref 15–41)
Albumin: 3.9 g/dL (ref 3.5–5.0)
Alkaline Phosphatase: 85 U/L (ref 38–126)
Anion gap: 5 (ref 5–15)
BUN: 12 mg/dL (ref 6–20)
CO2: 31 mmol/L (ref 22–32)
Calcium: 9.5 mg/dL (ref 8.9–10.3)
Chloride: 103 mmol/L (ref 98–111)
Creatinine: 0.91 mg/dL (ref 0.44–1.00)
GFR, Estimated: >60 mL/min (ref >60)
Glucose, Bld: 148 mg/dL — ABNORMAL HIGH (ref 70–99)
Potassium: 4.3 mmol/L (ref 3.5–5.1)
Sodium: 139 mmol/L (ref 135–145)
Total Bilirubin: 0.3 mg/dL (ref 0.3–1.2)
Total Protein: 6.7 g/dL (ref 6.5–8.1)

## 2021-03-12 LAB — CBC WITH DIFFERENTIAL (CANCER CENTER ONLY)
Abs Immature Granulocytes: 0.01 10*3/uL (ref 0.00–0.07)
Basophils Absolute: 0 10*3/uL (ref 0.0–0.1)
Basophils Relative: 1 %
Eosinophils Absolute: 0.2 10*3/uL (ref 0.0–0.5)
Eosinophils Relative: 3 %
HCT: 34.1 % — ABNORMAL LOW (ref 36.0–46.0)
Hemoglobin: 10.9 g/dL — ABNORMAL LOW (ref 12.0–15.0)
Immature Granulocytes: 0 %
Lymphocytes Relative: 47 %
Lymphs Abs: 2.5 10*3/uL (ref 0.7–4.0)
MCH: 28.4 pg (ref 26.0–34.0)
MCHC: 32 g/dL (ref 30.0–36.0)
MCV: 88.8 fL (ref 80.0–100.0)
Monocytes Absolute: 0.5 10*3/uL (ref 0.1–1.0)
Monocytes Relative: 10 %
Neutro Abs: 2 10*3/uL (ref 1.7–7.7)
Neutrophils Relative %: 39 %
Platelet Count: 285 10*3/uL (ref 150–400)
RBC: 3.84 MIL/uL — ABNORMAL LOW (ref 3.87–5.11)
RDW: 12.8 % (ref 11.5–15.5)
WBC Count: 5.2 10*3/uL (ref 4.0–10.5)
nRBC: 0 % (ref 0.0–0.2)

## 2021-03-12 LAB — LACTATE DEHYDROGENASE: LDH: 134 U/L (ref 98–192)

## 2021-03-12 MED ORDER — HEPARIN SOD (PORK) LOCK FLUSH 100 UNIT/ML IV SOLN
500.0000 [IU] | Freq: Once | INTRAVENOUS | Status: AC
  Administered 2021-03-12: 500 [IU] via INTRAVENOUS

## 2021-03-12 MED ORDER — SODIUM CHLORIDE 0.9% FLUSH
10.0000 mL | Freq: Once | INTRAVENOUS | Status: AC
  Administered 2021-03-12: 10 mL via INTRAVENOUS

## 2021-03-12 MED ORDER — PREVNAR 20 0.5 ML IM SUSY
PREFILLED_SYRINGE | INTRAMUSCULAR | 0 refills | Status: DC
  Filled 2021-03-12: qty 0.5, 1d supply, fill #0

## 2021-03-12 MED ORDER — INFLUENZA VAC SPLIT QUAD 0.5 ML IM SUSY
PREFILLED_SYRINGE | INTRAMUSCULAR | 0 refills | Status: DC
  Filled 2021-03-12: qty 0.5, 1d supply, fill #0

## 2021-03-12 NOTE — Patient Instructions (Signed)

## 2021-03-12 NOTE — Progress Notes (Signed)
Hematology and Oncology Follow Up Visit  Paula [name] [id number] Apr 13, 1965 56 y.o. 03/12/2021   Principle Diagnosis:  Stage IIA (T1a, N2, M0) gastric adenocarcinoma diagnosed in July of 2015 Intermittent iron deficiency anemia   Past Therapy: Status post diagnostic laparoscopy, distal gastrectomy with billroth II anastamosis and feeding gastrojejunostomy tube under the care of Dr. Barry Dienes on 09/20/2013 Adjuvant systemic chemotherapy with 5-FU 600 mg/M2 with leucovorin on days 1, 8, 15, 22, 29 and 36 every 8 weeks Adjuvant concurrent chemoradiation with Xeloda 650 mg/M2 by mouth twice a day for 5 days every week with radiation. The patient completed the course of radiotherapy but she declined to take Xeloda during her radiation   Current Therapy:        Observation IV iron as indicated    Interim History:  Paula [name] is here today for follow-up. She is doing well. She had a bout of n/v that started last Saturday thru Tuesday after eating from a buffet at a women's retreat. Thankfully she has had no issues since Tuesday and is feeling better.  Hgb is stable at 10.9, MCV 88, platelets 285 and WBC count 5.2.  No blood loss noted. No abnormal bruising, no petechiae.  She has occasional SOB and chest discomfort. She states that she has been referred to pulmonology she states for "scarring in her right lung".  No fever, chills, cough, rash, dizziness, palpitations, abdominal pain or changes in bowel or bladder habits.  No swelling, tenderness, numbness or tingling in her extremities.  No falls or syncope to report.  Her appetite is good and she is doing her best to stay well hydrated. Her weight is stable at 136 lbs.   ECOG Performance Status: 1 - Symptomatic but completely ambulatory  Medications:  Allergies as of 03/12/2021       Reactions   Tape Itching   Ok with adhesive and paper tape         Medication List        Accurate as of March 12, 2021  3:17 PM.  If you have any questions, ask your nurse or doctor.          fluticasone 50 MCG/ACT nasal spray Commonly known as: FLONASE Place 1 spray into both nostrils daily.   gabapentin 600 MG tablet Commonly known as: NEURONTIN Take 1 tablet (600 mg total) by mouth 2 (two) times daily.   HYDROcodone-acetaminophen 5-325 MG tablet Commonly known as: NORCO/VICODIN Take 1 tablet by mouth every 6 (six) hours as needed for moderate pain.   lidocaine-prilocaine cream Commonly known as: EMLA Apply 1 application topically as needed.   nitroGLYCERIN 0.4 MG SL tablet Commonly known as: NITROSTAT Place 1 tablet (0.4 mg total) under the tongue every 5 (five) minutes as needed for chest pain.   pantoprazole 40 MG tablet Commonly known as: PROTONIX Take 1 tablet (40 mg total) by mouth daily.   promethazine 25 MG tablet Commonly known as: PHENERGAN TAKE 1 TABLET BY MOUTH EVERY 6 HOURS AS NEEDED FOR NAUSEA & VOMITING What changed: Another medication with the same name was removed. Continue taking this medication, and follow the directions you see here. Changed by: Lottie Dawson, NP   WOMENS MULTIVITAMIN PO Take by mouth.        Allergies:  Allergies  Allergen Reactions   Tape Itching    Ok with adhesive and paper tape     Past Medical History, Surgical history, Social history, and Family History were reviewed and updated.  Review  of Systems: All other 10 point review of systems is negative.   Physical Exam:  height is 5\' 1"  (1.549 m) and weight is 136 lb (61.7 kg).   Wt Readings from Last 3 Encounters:  03/12/21 136 lb (61.7 kg)  12/30/20 134 lb 8 oz (61 kg)  10/05/20 126 lb (57.2 kg)    Ocular: Sclerae unicteric, pupils equal, round and reactive to light Ear-nose-throat: Oropharynx clear, dentition fair Lymphatic: No cervical or supraclavicular adenopathy Lungs no rales or rhonchi, good excursion bilaterally Heart regular rate and rhythm, no murmur appreciated Abd soft,  nontender, positive bowel sounds MSK no focal spinal tenderness, no joint edema Neuro: non-focal, well-oriented, appropriate affect Breasts: Deferred   Lab Results  Component Value Date   WBC 5.2 03/12/2021   HGB 10.9 (L) 03/12/2021   HCT 34.1 (L) 03/12/2021   MCV 88.8 03/12/2021   PLT 285 03/12/2021   Lab Results  Component Value Date   FERRITIN 74 12/30/2020   IRON 51 12/30/2020   TIBC 256 12/30/2020   UIBC 205 12/30/2020   IRONPCTSAT 20 12/30/2020   Lab Results  Component Value Date   RETICCTPCT 1.4 05/12/2020   RBC 3.84 (L) 03/12/2021   No results found for: Nils Pyle [facility] Lab Results  Component Value Date   IGGSERUM 1,273 05/21/2015   IGMSERUM 40 05/21/2015   No results found for: Odetta Pink, SPEI   Chemistry      Component Value Date/Time   NA 139 03/12/2021 1445   NA 140 12/30/2020 1506   NA 140 01/26/2017 1002   NA 140 09/24/2015 0953   K 4.3 03/12/2021 1445   K 3.5 01/26/2017 1002   K 4.0 09/24/2015 0953   CL 103 03/12/2021 1445   CL 106 01/26/2017 1002   CO2 31 03/12/2021 1445   CO2 26 01/26/2017 1002   CO2 23 09/24/2015 0953   BUN 12 03/12/2021 1445   BUN 14 12/30/2020 1506   BUN 6 (L) 01/26/2017 1002   BUN 8.6 09/24/2015 0953   CREATININE 0.91 03/12/2021 1445   CREATININE 1.1 01/26/2017 1002   CREATININE 1.0 09/24/2015 0953      Component Value Date/Time   CALCIUM 9.5 03/12/2021 1445   CALCIUM 8.9 01/26/2017 1002   CALCIUM 9.6 09/24/2015 0953   ALKPHOS 85 03/12/2021 1445   ALKPHOS 68 01/26/2017 1002   ALKPHOS 99 09/24/2015 0953   AST 18 03/12/2021 1445   AST 17 09/24/2015 0953   ALT 12 03/12/2021 1445   ALT 17 01/26/2017 1002   ALT 11 09/24/2015 0953   BILITOT 0.3 03/12/2021 1445   BILITOT 0.83 09/24/2015 0953       Impression and Plan: Paula [name] is a pleasant 56 yo African American female with history of stage II gastric cancer and partial  gastrectomy followed by adjuvant chemo and radiation. She is doing well and has no complaints at this time.  Iron studies are pending.  Port flush and lab check every 2 months, follow-up in 6 months.   Lottie Dawson, NP 1/20/20233:17 PM

## 2021-03-15 LAB — FERRITIN: Ferritin: 67 ng/mL (ref 11–307)

## 2021-03-15 LAB — IRON AND IRON BINDING CAPACITY (CC-WL,HP ONLY)
Iron: 50 ug/dL (ref 28–170)
Saturation Ratios: 18 % (ref 10.4–31.8)
TIBC: 279 ug/dL (ref 250–450)
UIBC: 229 ug/dL (ref 148–442)

## 2021-03-31 ENCOUNTER — Other Ambulatory Visit (HOSPITAL_BASED_OUTPATIENT_CLINIC_OR_DEPARTMENT_OTHER): Payer: Self-pay

## 2021-03-31 ENCOUNTER — Other Ambulatory Visit: Payer: Self-pay | Admitting: *Deleted

## 2021-03-31 DIAGNOSIS — C163 Malignant neoplasm of pyloric antrum: Secondary | ICD-10-CM

## 2021-03-31 DIAGNOSIS — C169 Malignant neoplasm of stomach, unspecified: Secondary | ICD-10-CM

## 2021-03-31 MED ORDER — HYDROCODONE-ACETAMINOPHEN 5-325 MG PO TABS
1.0000 | ORAL_TABLET | Freq: Four times a day (QID) | ORAL | 0 refills | Status: DC | PRN
  Filled 2021-03-31: qty 60, 15d supply, fill #0

## 2021-04-01 ENCOUNTER — Other Ambulatory Visit (HOSPITAL_BASED_OUTPATIENT_CLINIC_OR_DEPARTMENT_OTHER): Payer: Self-pay

## 2021-04-05 ENCOUNTER — Ambulatory Visit: Payer: Medicare Other | Admitting: Gastroenterology

## 2021-04-30 ENCOUNTER — Other Ambulatory Visit: Payer: Self-pay

## 2021-04-30 ENCOUNTER — Ambulatory Visit: Payer: Medicare Other | Admitting: Gastroenterology

## 2021-04-30 ENCOUNTER — Other Ambulatory Visit (HOSPITAL_BASED_OUTPATIENT_CLINIC_OR_DEPARTMENT_OTHER): Payer: Self-pay

## 2021-04-30 ENCOUNTER — Ambulatory Visit (INDEPENDENT_AMBULATORY_CARE_PROVIDER_SITE_OTHER): Payer: Medicare Other | Admitting: Gastroenterology

## 2021-04-30 ENCOUNTER — Other Ambulatory Visit: Payer: Self-pay | Admitting: *Deleted

## 2021-04-30 ENCOUNTER — Encounter: Payer: Self-pay | Admitting: Gastroenterology

## 2021-04-30 VITALS — BP 122/78 | HR 80 | Ht 61.0 in | Wt 134.5 lb

## 2021-04-30 DIAGNOSIS — T451X5A Adverse effect of antineoplastic and immunosuppressive drugs, initial encounter: Secondary | ICD-10-CM | POA: Diagnosis not present

## 2021-04-30 DIAGNOSIS — C169 Malignant neoplasm of stomach, unspecified: Secondary | ICD-10-CM

## 2021-04-30 DIAGNOSIS — R110 Nausea: Secondary | ICD-10-CM

## 2021-04-30 DIAGNOSIS — C163 Malignant neoplasm of pyloric antrum: Secondary | ICD-10-CM

## 2021-04-30 DIAGNOSIS — R112 Nausea with vomiting, unspecified: Secondary | ICD-10-CM

## 2021-04-30 MED ORDER — PANTOPRAZOLE SODIUM 40 MG PO TBEC
40.0000 mg | DELAYED_RELEASE_TABLET | Freq: Every day | ORAL | 4 refills | Status: DC
  Filled 2021-04-30: qty 90, 90d supply, fill #0
  Filled 2021-08-06 – 2021-08-26 (×2): qty 90, 90d supply, fill #1

## 2021-04-30 MED ORDER — PROMETHAZINE HCL 25 MG PO TABS
25.0000 mg | ORAL_TABLET | Freq: Two times a day (BID) | ORAL | 2 refills | Status: DC | PRN
  Filled 2021-04-30: qty 60, 30d supply, fill #0

## 2021-04-30 MED ORDER — HYDROCODONE-ACETAMINOPHEN 5-325 MG PO TABS: 1.0000 | ORAL_TABLET | Freq: Four times a day (QID) | ORAL | 0 refills | Status: DC | PRN

## 2021-04-30 NOTE — Patient Instructions (Signed)
If you are age 55 or older, your body mass index should be between 23-30. Your Body mass index is 25.41 kg/m?Marland Kitchen If this is out of the aforementioned range listed, please consider follow up with your Primary Care Provider. ? ?If you are age 28 or younger, your body mass index should be between 19-25. Your Body mass index is 25.41 kg/m?Marland Kitchen If this is out of the aformentioned range listed, please consider follow up with your Primary Care Provider.  ? ?________________________________________________________ ? ?The [facility] providers would like to encourage you to use [facility] to communicate with providers for non-urgent requests or questions.  Due to long hold times on the telephone, sending your provider a message by [facility] may be a faster and more efficient way to get a response.  Please allow 48 business hours for a response.  Please remember that this is for non-urgent requests.  ?_______________________________________________________ ? ?We have sent the following medications to your pharmacy for you to pick up at your convenience: ?Protonix and Phenergan ? ?Please call with any questions or concerns. ? ?Thank you, ? ?Dr. Jackquline Denmark ? ?

## 2021-04-30 NOTE — Progress Notes (Signed)
Chief Complaint: N/V  Referring Provider:  Marcie Mowers, FNP      ASSESSMENT AND PLAN;   #1. N/V (resolved)  Prev neg CT AP 12/19/2018 for recurrence of gastric cancer.  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 Nl TSH. EGD Jan 2021 with 2 cm Barrett's esophagus, small gastric remnant with angular takeoff of afferent/efferent limbs. Refused revision (Dr Barry Dienes)  #2. Stage IIA (T1a, N2, M0) gastric AdenoCa s/p BII gastrectomy/feeding J tube July 2015 followed by neoadjuvant chemoradiation (Dr Barry Dienes). Neg CT AP 12/19/2018. EGD 01/2018, 02/2019 - as below. Neg for recurrence.  #3. IDA d/t Billroth II gastrectomy.  #4. Chronic constipation.  Refuses screening colon.  Plan:  -Phenegren 95m po BID to continue #60. Can use it PRN.  Sedation/fall precautions. -Continue protonix 450mPO QD #90, 4 refills -I am glad she has quit smoking. -Stop marijauana use.  -I have encouraged her to eat small but more frequent meals 6-7 times/day.   -Minimize narcotics. -Take exlax everyday   HPI:    Paula [name] is a [age].o. female  With H/O stage II gastric AdenoCa s/p Billroth II gastrectomy 2015 followed by neoadjuvant chemoradiation.  Has been having persistent abdominal pain, intermittent N/V ever since. Neg CT AP as below 11/2018. EGD 02/2019 showed small gastric remnant with moderate gastritis (neg Bx), angular takeoff of the efferent limb.  2 cm Barrett's esophagus without dysplasia.  Refused revision  Had N/V - resolved now.  No odynophagia or dysphagia.  No GI bleeding.  Phenergan helps with nausea/vomiting.  She takes it twice a day.  Overall has been eating better.  She could not tolerate Ensure.  She is currently taking milkshakes 2/day.  Has gained weight as below.  Has constipation related to narcotics.  BMs 1/week which are hard to pass.  She does have associated abdominal bloating.  Has been taking Ex-Lax once per week.  Has  recently quit smoking. No nonsteroidals.  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    From previous notes:  Adm to [facility] with UGI bleed s/p EGD 12/09/2019 by Dr. ShKirby Crigleras negative except chronic findings.  She was managed conservatively.  CT Abdo/pelvis in addition to above findings did show pancreatic atrophy.  A trial of pancreatic enzymes was tried without any improvement.    Wt Readings from Last 3 Encounters:  04/30/21 134 lb 8 oz (61 kg)  03/12/21 136 lb (61.7 kg)  12/30/20 134 lb 8 oz (61 kg)     Previous GI procedures:  Underwent CT Abdo/pelvis on 01/19/2021, 12/19/2018 which did not show any acute abnormalities.  No recurrence.  EGD 03/06/2019 - Moderate gastritis. (Likely etiology of recurrent anemia). - Likely 2 cm Barrett's esophagus (biopsied) - Patent Billroth II gastrojejunostomy with normal afferent/efferent limbs. Did have angular takeoff. -No recurrence of gastric cancer. -Bx: Eso: Barrett's esophagus without dysplasia. Gastric: Gastritis with negative HP.  EGD 02/07/2018 [facility] -Salmon-colored mucosa extending from 37 to 38 cm. -Billroth II gastrectomy.  Angular takeoff of both limbs. -Gastritis (Bx- neg) -Single 3 mm gastric polyp at anastomosis (Bx-hyperplastic polyp) -Small amount of food residue in stomach.  Bilious gastric fluid. -Dilated lacteals in the duodenum (Bx- neg, neg for celiac) -No recurrence.  CTA chest/Abdo/pelvis 02/23/2019 1. No acute thoracic, abdominal or pelvic pathology. Specifically, no evidence for aortic dissection or aneurysm. 2. Chronic findings as above including Port-A-Cath, postsurgical changes  Refused colonoscopy.  I talked to her today as well.  Past Medical History:  Diagnosis Date   Anxiety    per pt   Arthritis    back   Barrett esophagus    Costochondritis    Esophageal reflux    On omeprazole   Family history of cancer    Headache(784.0) 2011   Migraines   Heart murmur     never caused any problems   History of hiatal hernia    History of radiation therapy 05/12/14- 06/17/14   post-op gastric region/ nodes/ 45 Gy in 25 fractions.    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 [id number]   Wears partial dentures    upper dentures    Past Surgical History:  Procedure Laterality Date   CESAREAN SECTION     x 2   ESOPHAGOGASTRODUODENOSCOPY N/A 09/16/2013   Procedure: ESOPHAGOGASTRODUODENOSCOPY (EGD);  Surgeon: Wonda Horner, MD;  Location: [facility];  Service: Endoscopy;  Laterality: N/A;   ESOPHAGOGASTRODUODENOSCOPY  12/09/2019   [facility]   GASTROSTOMY     r/t stomach cancer   IR CV LINE INJECTION  08/30/2017   IR FLUORO GUIDE PORT INSERTION RIGHT  03/21/2017   IR US GUIDE VASC ACCESS RIGHT  03/21/2017   LAPAROSCOPY N/A 09/20/2013   Procedure: DIAGNOSTIC LAPAROSCOPY,DISTAL GASTRECTOMY AND FEEDING GASTROJEJUNOSTOMY;  Surgeon: [name], MD;  Location: [facility];  Service: General;  Laterality: N/A;   OOPHORECTOMY     Around 1985 left ovary removal   PORTACATH PLACEMENT Left 11/18/2013   Procedure: INSERTION PORT-A-CATH;  Surgeon: [name], MD;  Location: [facility];  Service: General;  Laterality: Left;   TONSILLECTOMY     Around 1994   TUBAL LIGATION     UNILATERAL SALPINGECTOMY Left 11/09/2017   Procedure: UNILATERAL SALPINGECTOMY;  Surgeon: Osborne Oman, MD;  Location: [facility];  Service: Gynecology;  Laterality: Left;   UPPER GASTROINTESTINAL ENDOSCOPY     VAGINAL HYSTERECTOMY Left 11/09/2017   Procedure: HYSTERECTOMY VAGINAL WITH LEFT SALPINGECTOMY;  Surgeon: Osborne Oman, MD;  Location: [facility];  Service: Gynecology;  Laterality: Left;    Family History  Problem Relation Age  of Onset   Hypertension Mother    Diabetes Mellitus II Mother    Other Mother        hx of hysterectomy for unspecified reason   Hypertension Sister    Hypertension Brother    Heart failure Maternal Grandmother    Heart Problems Maternal Grandmother    Hypertension Maternal Grandfather    Diabetes Maternal Grandfather    Colon cancer Paternal Uncle        dx. late 50s-early 60s   Gastric cancer Cousin 75   Colon cancer Cousin        dx. early-late 81s; (father also had colon cancer)   Colon cancer Paternal Uncle        dx. late 50s-early 60s   Lung cancer Paternal Uncle        dx. 60s; smoker   Colon polyps Paternal Uncle        unknown number   Heart attack Maternal Aunt    Heart Problems Paternal Aunt    Heart attack Paternal Grandfather  Colon cancer Paternal Grandfather        dx. 64s-60s   Colon polyps Brother        1 maternal half-brother had approx 3 polyps   Breast cancer Cousin 34   Breast cancer Cousin        dx. early 46s or earlier (father had colon cancer)   Lung cancer Other        PGF's brothers (x4); dx. later in life; lim info   Esophageal cancer Neg Hx    Rectal cancer Neg Hx     Social History   Tobacco Use   Smoking status: Former    Packs/day: 1.00    Years: 16.00    Pack years: 16.00    Types: Cigarettes    Quit date: 02/21/1998    Years since quitting: 23.2   Smokeless tobacco: Never   Tobacco comments:    0.5-1.0 ppd for 16 yrs  Vaping Use   Vaping Use: Never used  Substance Use Topics   Alcohol use: Not Currently   Drug use: Not Currently    Current Outpatient Medications  Medication Sig Dispense Refill   fluticasone (FLONASE) 50 MCG/ACT nasal spray Place 1 spray into both nostrils daily. 16 g 2   HYDROcodone-acetaminophen (NORCO/VICODIN) 5-325 MG tablet Take 1 tablet by mouth every 6 (six) hours as needed for moderate pain. 60 tablet 0   lidocaine-prilocaine (EMLA) cream Apply 1 application topically as needed. 30 g 6    Multiple Vitamins-Minerals (WOMENS MULTIVITAMIN PO) Take by mouth.     pantoprazole (PROTONIX) 40 MG tablet Take 1 tablet (40 mg total) by mouth daily. 90 tablet 3   promethazine (PHENERGAN) 25 MG tablet TAKE 1 TABLET BY MOUTH EVERY 6 HOURS AS NEEDED FOR NAUSEA & VOMITING 90 tablet 2   gabapentin (NEURONTIN) 600 MG tablet Take 1 tablet (600 mg total) by mouth 2 (two) times daily. 60 tablet 1   nitroGLYCERIN (NITROSTAT) 0.4 MG SL tablet Place 1 tablet (0.4 mg total) under the tongue every 5 (five) minutes as needed for chest pain. (Patient not taking: No sig reported) 25 tablet 2   No current facility-administered medications for this visit.   Facility-Administered Medications Ordered in Other Visits  Medication Dose Route Frequency Provider Last Rate Last Admin   sodium chloride flush (NS) 0.9 % injection 10 mL  10 mL Intravenous PRN Volanda Napoleon, MD   10 mL at 01/03/18 1316   sodium chloride flush (NS) 0.9 % injection 10 mL  10 mL Intravenous PRN Volanda Napoleon, MD   10 mL at 09/10/19 1114    Allergies  Allergen Reactions   Tape Itching    Ok with adhesive and paper tape     Review of Systems:  neg     Physical Exam:    BP 122/78    Pulse 80    Ht _0  (1.549 m)    Wt 134 lb 8 oz (61 kg)    LMP 02/28/2016 (Exact Date)    SpO2 99%    BMI 25.41 kg/m  Wt Readings from Last 3 Encounters:  04/30/21 134 lb 8 oz (61 kg)  03/12/21 136 lb (61.7 kg)  12/30/20 134 lb 8 oz (61 kg)   Constitutional: Had cachexia.  In no acute distress. Psychiatric: Normal mood and affect. Behavior is normal. HEENT: Pupils normal.  Conjunctivae are normal. No scleral icterus.  Cardiovascular: Normal rate, regular rhythm. No edema Pulmonary/chest: Effort normal and breath sounds normal. No wheezing,  rales or rhonchi. Abdominal: Soft, nondistended. Nontender. Bowel sounds active throughout. There are no masses palpable. No hepatomegaly.  Well-healed surgical scars. Neurological: Alert and oriented to  person place and time. Skin: Skin is warm and dry. No rashes noted.  Data Reviewed: I have personally reviewed following labs and imaging studies  CBC: CBC Latest Ref Rng & Units 03/12/2021 12/30/2020 08/12/2020  WBC 4.0 - 10.5 K/uL 5.2 5.8 4.2  Hemoglobin 12.0 - 15.0 g/dL 10.9(L) 10.7(L) 10.3(L)  Hematocrit 36.0 - 46.0 % 34.1(L) 34.4 32.7(L)  Platelets 150 - 400 K/uL 285 227 242    CMP: CMP Latest Ref Rng & Units 03/12/2021 12/30/2020 08/12/2020  Glucose 70 - 99 mg/dL 148(H) 141(H) 125(H)  BUN 6 - 20 mg/dL _0 Creatinine 0.44 - 1.00 mg/dL 0.91 0.87 0.85  Sodium 135 - 145 mmol/L 139 140 138  Potassium 3.5 - 5.1 mmol/L 4.3 4.4 4.0  Chloride 98 - 111 mmol/L 103 102 102  CO2 22 - 32 mmol/L _1 Calcium 8.9 - 10.3 mg/dL 9.5 8.5(L) 9.0  Total Protein 6.5 - 8.1 g/dL 6.7 6.2 6.4(L)  Total Bilirubin 0.3 - 1.2 mg/dL 0.3 <0.2 0.6  Alkaline Phos 38 - 126 U/L 85 81 70  AST 15 - 41 U/L _2 ALT 0 - 44 U/L _3 Hepatic Function Latest Ref Rng & Units 03/12/2021 12/30/2020 08/12/2020  Total Protein 6.5 - 8.1 g/dL 6.7 6.2 6.4(L)  Albumin 3.5 - 5.0 g/dL 3.9 4.2 3.9  AST 15 - 41 U/L _4 ALT 0 - 44 U/L _5 Alk Phosphatase 38 - 126 U/L 85 81 70  Total Bilirubin 0.3 - 1.2 mg/dL 0.3 <0.2 0.6     Carmell Austria, MD 04/30/2021, 9:49 AM  Cc: Marcie Mowers, FNP

## 2021-05-07 ENCOUNTER — Other Ambulatory Visit: Payer: Self-pay

## 2021-05-07 ENCOUNTER — Inpatient Hospital Stay: Payer: Medicare Other

## 2021-05-07 ENCOUNTER — Inpatient Hospital Stay: Payer: Medicare Other | Attending: Hematology & Oncology

## 2021-05-07 VITALS — BP 118/69 | HR 78 | Temp 98.1°F | Resp 17

## 2021-05-07 DIAGNOSIS — Z85028 Personal history of other malignant neoplasm of stomach: Secondary | ICD-10-CM | POA: Insufficient documentation

## 2021-05-07 DIAGNOSIS — Z95828 Presence of other vascular implants and grafts: Secondary | ICD-10-CM

## 2021-05-07 DIAGNOSIS — D509 Iron deficiency anemia, unspecified: Secondary | ICD-10-CM | POA: Insufficient documentation

## 2021-05-07 DIAGNOSIS — C163 Malignant neoplasm of pyloric antrum: Secondary | ICD-10-CM

## 2021-05-07 DIAGNOSIS — D500 Iron deficiency anemia secondary to blood loss (chronic): Secondary | ICD-10-CM

## 2021-05-07 LAB — CBC WITH DIFFERENTIAL (CANCER CENTER ONLY)
Abs Immature Granulocytes: 0.01 10*3/uL (ref 0.00–0.07)
Basophils Absolute: 0 10*3/uL (ref 0.0–0.1)
Basophils Relative: 0 %
Eosinophils Absolute: 0.1 10*3/uL (ref 0.0–0.5)
Eosinophils Relative: 2 %
HCT: 32.2 % — ABNORMAL LOW (ref 36.0–46.0)
Hemoglobin: 10 g/dL — ABNORMAL LOW (ref 12.0–15.0)
Immature Granulocytes: 0 %
Lymphocytes Relative: 31 %
Lymphs Abs: 2.1 10*3/uL (ref 0.7–4.0)
MCH: 28.2 pg (ref 26.0–34.0)
MCHC: 31.1 g/dL (ref 30.0–36.0)
MCV: 90.7 fL (ref 80.0–100.0)
Monocytes Absolute: 0.6 10*3/uL (ref 0.1–1.0)
Monocytes Relative: 10 %
Neutro Abs: 3.8 10*3/uL (ref 1.7–7.7)
Neutrophils Relative %: 57 %
Platelet Count: 271 10*3/uL (ref 150–400)
RBC: 3.55 MIL/uL — ABNORMAL LOW (ref 3.87–5.11)
RDW: 15.2 % (ref 11.5–15.5)
WBC Count: 6.6 10*3/uL (ref 4.0–10.5)
nRBC: 0 % (ref 0.0–0.2)

## 2021-05-07 LAB — CMP (CANCER CENTER ONLY)
ALT: 11 U/L (ref 0–44)
AST: 14 U/L — ABNORMAL LOW (ref 15–41)
Albumin: 3.7 g/dL (ref 3.5–5.0)
Alkaline Phosphatase: 77 U/L (ref 38–126)
Anion gap: 5 (ref 5–15)
BUN: 14 mg/dL (ref 6–20)
CO2: 30 mmol/L (ref 22–32)
Calcium: 9 mg/dL (ref 8.9–10.3)
Chloride: 103 mmol/L (ref 98–111)
Creatinine: 0.93 mg/dL (ref 0.44–1.00)
GFR, Estimated: >60 mL/min (ref >60)
Glucose, Bld: 120 mg/dL — ABNORMAL HIGH (ref 70–99)
Potassium: 4.1 mmol/L (ref 3.5–5.1)
Sodium: 138 mmol/L (ref 135–145)
Total Bilirubin: 0.4 mg/dL (ref 0.3–1.2)
Total Protein: 6.4 g/dL — ABNORMAL LOW (ref 6.5–8.1)

## 2021-05-07 LAB — IRON AND IRON BINDING CAPACITY (CC-WL,HP ONLY)
Iron: 64 ug/dL (ref 28–170)
Saturation Ratios: 22 % (ref 10.4–31.8)
TIBC: 291 ug/dL (ref 250–450)
UIBC: 227 ug/dL (ref 148–442)

## 2021-05-07 LAB — LACTATE DEHYDROGENASE: LDH: 121 U/L (ref 98–192)

## 2021-05-07 MED ORDER — HEPARIN SOD (PORK) LOCK FLUSH 100 UNIT/ML IV SOLN
500.0000 [IU] | Freq: Once | INTRAVENOUS | Status: AC
  Administered 2021-05-07: 500 [IU] via INTRAVENOUS

## 2021-05-07 MED ORDER — SODIUM CHLORIDE 0.9% FLUSH
10.0000 mL | Freq: Once | INTRAVENOUS | Status: AC
  Administered 2021-05-07: 10 mL via INTRAVENOUS

## 2021-05-07 NOTE — Patient Instructions (Signed)

## 2021-05-10 LAB — FERRITIN: Ferritin: 56 ng/mL (ref 11–307)

## 2021-05-17 ENCOUNTER — Other Ambulatory Visit: Payer: Self-pay | Admitting: Obstetrics & Gynecology

## 2021-05-17 DIAGNOSIS — N951 Menopausal and female climacteric states: Secondary | ICD-10-CM

## 2021-05-18 MED ORDER — GABAPENTIN 600 MG PO TABS: 600.0000 mg | ORAL_TABLET | Freq: Two times a day (BID) | ORAL | 1 refills | Status: DC

## 2021-05-31 ENCOUNTER — Other Ambulatory Visit: Payer: Self-pay | Admitting: *Deleted

## 2021-05-31 DIAGNOSIS — C169 Malignant neoplasm of stomach, unspecified: Secondary | ICD-10-CM

## 2021-05-31 DIAGNOSIS — C163 Malignant neoplasm of pyloric antrum: Secondary | ICD-10-CM

## 2021-05-31 MED ORDER — HYDROCODONE-ACETAMINOPHEN 5-325 MG PO TABS: 1.0000 | ORAL_TABLET | Freq: Four times a day (QID) | ORAL | 0 refills | Status: DC | PRN

## 2021-06-28 ENCOUNTER — Other Ambulatory Visit: Payer: Self-pay | Admitting: Hematology & Oncology

## 2021-06-28 DIAGNOSIS — C163 Malignant neoplasm of pyloric antrum: Secondary | ICD-10-CM

## 2021-06-28 DIAGNOSIS — C169 Malignant neoplasm of stomach, unspecified: Secondary | ICD-10-CM

## 2021-06-29 ENCOUNTER — Encounter: Payer: Self-pay | Admitting: Family

## 2021-06-29 MED ORDER — HYDROCODONE-ACETAMINOPHEN 5-325 MG PO TABS: 1.0000 | ORAL_TABLET | Freq: Four times a day (QID) | ORAL | 0 refills | Status: DC | PRN

## 2021-07-05 ENCOUNTER — Encounter: Payer: Self-pay | Admitting: Family

## 2021-07-09 ENCOUNTER — Inpatient Hospital Stay: Payer: Medicare Other

## 2021-07-09 ENCOUNTER — Inpatient Hospital Stay: Payer: Medicare Other | Attending: Hematology & Oncology

## 2021-07-09 VITALS — BP 131/81 | HR 62 | Temp 97.6°F | Resp 17

## 2021-07-09 DIAGNOSIS — D500 Iron deficiency anemia secondary to blood loss (chronic): Secondary | ICD-10-CM

## 2021-07-09 DIAGNOSIS — D509 Iron deficiency anemia, unspecified: Secondary | ICD-10-CM | POA: Insufficient documentation

## 2021-07-09 DIAGNOSIS — Z85028 Personal history of other malignant neoplasm of stomach: Secondary | ICD-10-CM | POA: Diagnosis not present

## 2021-07-09 DIAGNOSIS — C163 Malignant neoplasm of pyloric antrum: Secondary | ICD-10-CM

## 2021-07-09 LAB — CBC WITH DIFFERENTIAL (CANCER CENTER ONLY)
Abs Immature Granulocytes: 0.02 10*3/uL (ref 0.00–0.07)
Basophils Absolute: 0 10*3/uL (ref 0.0–0.1)
Basophils Relative: 0 %
Eosinophils Absolute: 0.1 10*3/uL (ref 0.0–0.5)
Eosinophils Relative: 2 %
HCT: 34.3 % — ABNORMAL LOW (ref 36.0–46.0)
Hemoglobin: 11 g/dL — ABNORMAL LOW (ref 12.0–15.0)
Immature Granulocytes: 0 %
Lymphocytes Relative: 30 %
Lymphs Abs: 2.1 10*3/uL (ref 0.7–4.0)
MCH: 28.7 pg (ref 26.0–34.0)
MCHC: 32.1 g/dL (ref 30.0–36.0)
MCV: 89.6 fL (ref 80.0–100.0)
Monocytes Absolute: 0.6 10*3/uL (ref 0.1–1.0)
Monocytes Relative: 8 %
Neutro Abs: 4.3 10*3/uL (ref 1.7–7.7)
Neutrophils Relative %: 60 %
Platelet Count: 333 10*3/uL (ref 150–400)
RBC: 3.83 MIL/uL — ABNORMAL LOW (ref 3.87–5.11)
RDW: 13.9 % (ref 11.5–15.5)
WBC Count: 7.1 10*3/uL (ref 4.0–10.5)
nRBC: 0 % (ref 0.0–0.2)

## 2021-07-09 LAB — CMP (CANCER CENTER ONLY)
ALT: 14 U/L (ref 0–44)
AST: 16 U/L (ref 15–41)
Albumin: 4 g/dL (ref 3.5–5.0)
Alkaline Phosphatase: 68 U/L (ref 38–126)
Anion gap: 5 (ref 5–15)
BUN: 20 mg/dL (ref 6–20)
CO2: 32 mmol/L (ref 22–32)
Calcium: 9.2 mg/dL (ref 8.9–10.3)
Chloride: 101 mmol/L (ref 98–111)
Creatinine: 1.03 mg/dL — ABNORMAL HIGH (ref 0.44–1.00)
GFR, Estimated: >60 mL/min (ref >60)
Glucose, Bld: 111 mg/dL — ABNORMAL HIGH (ref 70–99)
Potassium: 4.7 mmol/L (ref 3.5–5.1)
Sodium: 138 mmol/L (ref 135–145)
Total Bilirubin: 0.3 mg/dL (ref 0.3–1.2)
Total Protein: 7 g/dL (ref 6.5–8.1)

## 2021-07-09 LAB — IRON AND IRON BINDING CAPACITY (CC-WL,HP ONLY)
Iron: 44 ug/dL (ref 28–170)
Saturation Ratios: 13 % (ref 10.4–31.8)
TIBC: 339 ug/dL (ref 250–450)
UIBC: 295 ug/dL

## 2021-07-09 LAB — FERRITIN: Ferritin: 27 ng/mL (ref 11–307)

## 2021-07-09 LAB — LACTATE DEHYDROGENASE: LDH: 144 U/L (ref 98–192)

## 2021-07-09 MED ORDER — HEPARIN SOD (PORK) LOCK FLUSH 100 UNIT/ML IV SOLN
250.0000 [IU] | Freq: Once | INTRAVENOUS | Status: AC | PRN
  Administered 2021-07-09: 500 [IU]

## 2021-07-09 MED ORDER — SODIUM CHLORIDE 0.9% FLUSH
3.0000 mL | Freq: Once | INTRAVENOUS | Status: AC | PRN
  Administered 2021-07-09: 10 mL

## 2021-07-10 ENCOUNTER — Encounter: Payer: Self-pay | Admitting: Family

## 2021-07-13 ENCOUNTER — Inpatient Hospital Stay: Payer: Medicare Other

## 2021-07-13 VITALS — BP 108/77 | HR 69 | Temp 98.0°F | Resp 16

## 2021-07-13 DIAGNOSIS — D509 Iron deficiency anemia, unspecified: Secondary | ICD-10-CM | POA: Diagnosis not present

## 2021-07-13 DIAGNOSIS — D500 Iron deficiency anemia secondary to blood loss (chronic): Secondary | ICD-10-CM

## 2021-07-13 MED ORDER — SODIUM CHLORIDE 0.9 % IV SOLN
200.0000 mg | Freq: Once | INTRAVENOUS | Status: DC
  Filled 2021-07-13: qty 10

## 2021-07-13 MED ORDER — SODIUM CHLORIDE 0.9 % IV SOLN
200.0000 mg | Freq: Once | INTRAVENOUS | Status: AC
  Administered 2021-07-13: 200 mg via INTRAVENOUS
  Filled 2021-07-13: qty 10

## 2021-07-13 MED ORDER — SODIUM CHLORIDE 0.9 % IV SOLN: Freq: Once | INTRAVENOUS | Status: AC

## 2021-07-13 NOTE — Patient Instructions (Signed)

## 2021-07-22 ENCOUNTER — Inpatient Hospital Stay: Payer: Medicare Other

## 2021-07-23 ENCOUNTER — Inpatient Hospital Stay: Payer: Medicare Other

## 2021-07-24 ENCOUNTER — Encounter (HOSPITAL_BASED_OUTPATIENT_CLINIC_OR_DEPARTMENT_OTHER): Payer: Self-pay | Admitting: *Deleted

## 2021-07-24 ENCOUNTER — Emergency Department (HOSPITAL_BASED_OUTPATIENT_CLINIC_OR_DEPARTMENT_OTHER): Payer: Medicare Other

## 2021-07-24 ENCOUNTER — Other Ambulatory Visit: Payer: Self-pay

## 2021-07-24 ENCOUNTER — Emergency Department (HOSPITAL_BASED_OUTPATIENT_CLINIC_OR_DEPARTMENT_OTHER)
Admission: EM | Admit: 2021-07-24 | Discharge: 2021-07-24 | Disposition: A | Discharge status: Home / Self Care | Payer: Medicare Other | Attending: Emergency Medicine | Admitting: Emergency Medicine

## 2021-07-24 DIAGNOSIS — M791 Myalgia, unspecified site: Secondary | ICD-10-CM | POA: Insufficient documentation

## 2021-07-24 DIAGNOSIS — I10 Essential (primary) hypertension: Secondary | ICD-10-CM | POA: Insufficient documentation

## 2021-07-24 DIAGNOSIS — J181 Lobar pneumonia, unspecified organism: Secondary | ICD-10-CM | POA: Diagnosis not present

## 2021-07-24 DIAGNOSIS — Z20822 Contact with and (suspected) exposure to covid-19: Secondary | ICD-10-CM | POA: Diagnosis not present

## 2021-07-24 DIAGNOSIS — Z85028 Personal history of other malignant neoplasm of stomach: Secondary | ICD-10-CM | POA: Insufficient documentation

## 2021-07-24 DIAGNOSIS — J189 Pneumonia, unspecified organism: Secondary | ICD-10-CM

## 2021-07-24 DIAGNOSIS — R059 Cough, unspecified: Secondary | ICD-10-CM | POA: Diagnosis present

## 2021-07-24 LAB — RESP PANEL BY RT-PCR (FLU A&B, COVID) ARPGX2
Influenza A by PCR: NEGATIVE
Influenza B by PCR: NEGATIVE
SARS Coronavirus 2 by RT PCR: NEGATIVE

## 2021-07-24 MED ORDER — KETOROLAC TROMETHAMINE 30 MG/ML IJ SOLN
30.0000 mg | Freq: Once | INTRAMUSCULAR | Status: AC
  Administered 2021-07-24: 30 mg via INTRAMUSCULAR
  Filled 2021-07-24: qty 1

## 2021-07-24 MED ORDER — LEVOFLOXACIN 750 MG PO TABS
750.0000 mg | ORAL_TABLET | Freq: Once | ORAL | Status: AC
Start: 2021-07-24 — End: 2021-07-24
  Administered 2021-07-24: 750 mg via ORAL
  Filled 2021-07-24: qty 1

## 2021-07-24 MED ORDER — LEVOFLOXACIN 750 MG PO TABS: 750.0000 mg | ORAL_TABLET | Freq: Every day | ORAL | 0 refills | Status: DC

## 2021-07-24 MED ORDER — ALBUTEROL SULFATE HFA 108 (90 BASE) MCG/ACT IN AERS
2.0000 | INHALATION_SPRAY | Freq: Once | RESPIRATORY_TRACT | Status: AC
  Administered 2021-07-24: 2 via RESPIRATORY_TRACT
  Filled 2021-07-24: qty 6.7

## 2021-07-24 NOTE — ED Notes (Signed)
AVS reviewed and given to client along with work noted by ED MD. Reinforced and re demonstrated use of inhaler with spacer. Also stressed the importance of taking all of abx as prescribed. Pt teaching done re: hand washing, taking in PO fluids and wearing mask when out in public. Opportunity for questions provided prior to DC to home

## 2021-07-24 NOTE — ED Triage Notes (Signed)
Cough and congestion, onset this past Thursday, also having body aches and HA. Poor appetite, denies vomiting, denies fevers.

## 2021-07-24 NOTE — Discharge Instructions (Addendum)
You have been given your first dose of levofloxacin, the antibiotic for pneumonia today.  Take the other ones over the next 6 days, this medication is a once daily medication.  Use the inhaler as needed for any chest tightness or difficulty breathing.  Please return with any worsening symptoms.  You may continue to take any over-the-counter cold and flu medication for your other symptoms.

## 2021-07-24 NOTE — ED Provider Notes (Signed)
[facility] EMERGENCY DEPARTMENT Provider Note   CSN: [id number] Arrival date & time: 07/24/21  9357     History  Chief Complaint  Patient presents with   Cough    Paula [name] is a 56 y.o. female with a past medical history of stomach cancer, migraines and hypertension presenting today with URI symptoms.  She says that on Thursday she started to experience body aches, cough productive of clear phlegm, headache, abdominal pain and fatigue.  Says that multiple other people at work have been sick with colds.  Tried to take Tylenol cold and flu which barely helped.  Also reports taking hydrocodone for her body aches as she has this prescribed to her.  No shortness of breath, chest pain, blurred vision, nausea, vomiting or diarrhea.   Cough Associated symptoms: myalgias   Associated symptoms: no chills, no ear pain, no eye discharge, no fever and no rhinorrhea       Home Medications Prior to Admission medications   Medication Sig Start Date End Date Taking? Authorizing Provider  fluticasone (FLONASE) 50 MCG/ACT nasal spray Place 1 spray into both nostrils daily. 12/10/18   Khatri, Hina, PA-C  gabapentin (NEURONTIN) 600 MG tablet Take 1 tablet (600 mg total) by mouth 2 (two) times daily. 05/18/21 07/17/21  Anyanwu, Sallyanne Havers, MD  HYDROcodone-acetaminophen (NORCO/VICODIN) 5-325 MG tablet Take 1 tablet by mouth every 6 (six) hours as needed for moderate pain. 06/29/21   Volanda Napoleon, MD  lidocaine-prilocaine (EMLA) cream Apply 1 application topically as needed. 02/19/21   Volanda Napoleon, MD  Multiple Vitamins-Minerals (WOMENS MULTIVITAMIN PO) Take by mouth.    [provider]  nitroGLYCERIN (NITROSTAT) 0.4 MG SL tablet Place 1 tablet (0.4 mg total) under the tongue every 5 (five) minutes as needed for chest pain. Patient not taking: No sig reported 12/20/18 05/15/19  Lendon Colonel, NP  pantoprazole (PROTONIX) 40 MG tablet Take 1 tablet (40 mg total) by  mouth daily. 04/30/21   Jackquline Denmark, MD  promethazine (PHENERGAN) 25 MG tablet Take 1 tablet (25 mg total) by mouth 2 (two) times daily as needed for nausea or vomiting. 04/30/21 04/30/22  Jackquline Denmark, MD      Allergies    Tape    Review of Systems   Review of Systems  Constitutional:  Negative for chills and fever.  HENT:  Positive for congestion. Negative for ear pain and rhinorrhea.   Eyes:  Negative for pain and discharge.  Respiratory:  Positive for cough.   Gastrointestinal:  Positive for abdominal pain. Negative for diarrhea, nausea and vomiting.  Musculoskeletal:  Positive for myalgias.   Physical Exam Updated Vital Signs BP 115/77 (BP Location: Right Arm)   Pulse 70   Temp 98.7 F (37.1 C) (Oral)   Resp 16   Ht '5\' 1"'$  (1.549 m)   Wt 63.6 kg   LMP 02/28/2016 (Exact Date)   SpO2 100%   BMI 26.49 kg/m  Physical Exam Vitals and nursing note reviewed.  Constitutional:      General: She is not in acute distress.    Appearance: Normal appearance. She is not ill-appearing.  HENT:     Head: Normocephalic and atraumatic.     Right Ear: Tympanic membrane normal.     Left Ear: Tympanic membrane normal.     Nose: Congestion present.     Mouth/Throat:     Mouth: Mucous membranes are moist.     Pharynx: Oropharynx is clear. No oropharyngeal exudate or  posterior oropharyngeal erythema.  Eyes:     General: No scleral icterus.    Conjunctiva/sclera: Conjunctivae normal.  Cardiovascular:     Rate and Rhythm: Normal rate and regular rhythm.  Pulmonary:     Effort: Pulmonary effort is normal. No respiratory distress.     Breath sounds: Wheezing (Bilateral upper lung fields) present.  Abdominal:     General: Abdomen is flat.     Palpations: Abdomen is soft.  Skin:    General: Skin is warm and dry.     Findings: No rash.  Neurological:     Mental Status: She is alert.  Psychiatric:        Mood and Affect: Mood normal.    ED Results / Procedures / Treatments    Labs (all labs ordered are listed, but only abnormal results are displayed) Labs Reviewed  RESP PANEL BY RT-PCR (FLU A&B, COVID) [id number]    EKG None  Radiology DG Chest Portable 1 View  Result Date: 07/24/2021 CLINICAL DATA:  Shortness of breath and cough for 2 days.  Malaise. EXAM: PORTABLE CHEST 1 VIEW COMPARISON:  10/17/2019 FINDINGS: The heart size and mediastinal contours are within normal limits. Right-sided power port remains in appropriate position. New mild asymmetric opacity is seen in the right lower lobe, suspicious for pneumonia. Left lung is clear. No evidence of pleural effusion. IMPRESSION: New mild asymmetric opacity in right lower lobe, suspicious for pneumonia. Electronically Signed   By: Marlaine Hind M.D.   On: 07/24/2021 10:03    Procedures Procedures   Medications Ordered in ED Medications  albuterol (VENTOLIN HFA) 108 (90 Base) MCG/ACT inhaler 2 puff (has no administration in time range)  levofloxacin (LEVAQUIN) tablet 750 mg (has no administration in time range)  ketorolac (TORADOL) 30 MG/ML injection 30 mg (30 mg Intramuscular Given 07/24/21 0949)    ED Course/ Medical Decision Making/ A&P                           Medical Decision Making Amount and/or Complexity of Data Reviewed Radiology: ordered.  Risk Prescription drug management.   This patient presents to the ED for concern of URI symptoms.  Past Medical History / Co-morbidities / Social History: Gastric cancer   Additional history: Additional history obtained from chart review.  Patient follows with Dr. Marin Olp with oncology for her gastric carcinoma.  He prescribes her hydrocodone.  Status post chemo, radiation and partial gastrectomy in 2017   Physical Exam: Physical exam performed. The pertinent findings include: Some bilateral lung field wheezing  Lab Tests: No labs ordered   Imaging Studies: I ordered imaging studies including chest x-ray. I independently visualized and interpreted  imaging which showed suspicious right lower lung field pneumonia. I agree with the radiologist interpretation.   Medications: I ordered medication including toradol. Reevaluation of the patient after these medicines showed that the patient improved. I have reviewed the patients home medicines and have made adjustments as needed.    Disposition: After consideration of the diagnostic results and the patients response to treatment, I feel that patient is stable for discharge home and outpatient pneumonia management.  Afebrile, nontachycardic and no respiratory distress.  She is agreeable to outpatient Levaquin and albuterol treatment.  Potential side effects of both medication discussed.  She will follow-up with her primary providers next week.  Work note supplied.   Final Clinical Impression(s) / ED Diagnoses Final diagnoses:  Community acquired pneumonia of right lower lobe of  lung    Rx / DC Orders ED Discharge Orders          Ordered    levofloxacin (LEVAQUIN) 750 MG tablet  Daily        07/24/21 1047           Results and diagnoses were explained to the patient. Return precautions discussed in full. Patient had no additional questions and expressed complete understanding.   This chart was dictated using voice recognition software.  Despite best efforts to proofread,  errors can occur which can change the documentation meaning.    Rhae Hammock, PA-C 07/24/21 1050    Malvin Johns, MD 07/24/21 1127

## 2021-07-24 NOTE — ED Notes (Signed)
Has strong prod cough of thick yellow/green sputum, very congested

## 2021-07-24 NOTE — ED Notes (Signed)
ED Provider at bedside. 

## 2021-07-26 ENCOUNTER — Inpatient Hospital Stay: Payer: Medicare Other | Attending: Hematology & Oncology

## 2021-07-26 VITALS — BP 107/68 | HR 76 | Temp 98.4°F | Resp 17

## 2021-07-26 DIAGNOSIS — D509 Iron deficiency anemia, unspecified: Secondary | ICD-10-CM | POA: Diagnosis not present

## 2021-07-26 DIAGNOSIS — D500 Iron deficiency anemia secondary to blood loss (chronic): Secondary | ICD-10-CM

## 2021-07-26 MED ORDER — HEPARIN SOD (PORK) LOCK FLUSH 100 UNIT/ML IV SOLN
500.0000 [IU] | Freq: Once | INTRAVENOUS | Status: AC
  Administered 2021-07-26: 500 [IU] via INTRAVENOUS

## 2021-07-26 MED ORDER — SODIUM CHLORIDE 0.9 % IV SOLN
200.0000 mg | Freq: Once | INTRAVENOUS | Status: AC
  Administered 2021-07-26: 200 mg via INTRAVENOUS
  Filled 2021-07-26: qty 200

## 2021-07-26 MED ORDER — SODIUM CHLORIDE 0.9% FLUSH
10.0000 mL | Freq: Once | INTRAVENOUS | Status: AC
  Administered 2021-07-26: 10 mL via INTRAVENOUS

## 2021-07-26 MED ORDER — SODIUM CHLORIDE 0.9 % IV SOLN: Freq: Once | INTRAVENOUS | Status: AC

## 2021-07-26 NOTE — Patient Instructions (Signed)

## 2021-07-27 ENCOUNTER — Other Ambulatory Visit: Payer: Self-pay | Admitting: *Deleted

## 2021-07-27 DIAGNOSIS — C169 Malignant neoplasm of stomach, unspecified: Secondary | ICD-10-CM

## 2021-07-27 DIAGNOSIS — C163 Malignant neoplasm of pyloric antrum: Secondary | ICD-10-CM

## 2021-07-27 MED ORDER — HYDROCODONE-ACETAMINOPHEN 5-325 MG PO TABS: 1.0000 | ORAL_TABLET | Freq: Four times a day (QID) | ORAL | 0 refills | Status: DC | PRN

## 2021-07-27 MED ORDER — LIDOCAINE-PRILOCAINE 2.5-2.5 % EX CREA
1.0000 | TOPICAL_CREAM | CUTANEOUS | 6 refills | Status: DC | PRN
Start: 2021-07-27 — End: 2022-12-22

## 2021-08-02 ENCOUNTER — Encounter: Payer: Self-pay | Admitting: Family

## 2021-08-02 ENCOUNTER — Encounter: Payer: Self-pay | Admitting: *Deleted

## 2021-08-05 ENCOUNTER — Other Ambulatory Visit: Payer: Self-pay | Admitting: Hematology & Oncology

## 2021-08-05 DIAGNOSIS — T451X5A Adverse effect of antineoplastic and immunosuppressive drugs, initial encounter: Secondary | ICD-10-CM

## 2021-08-05 DIAGNOSIS — R112 Nausea with vomiting, unspecified: Secondary | ICD-10-CM

## 2021-08-06 ENCOUNTER — Other Ambulatory Visit (HOSPITAL_BASED_OUTPATIENT_CLINIC_OR_DEPARTMENT_OTHER): Payer: Self-pay

## 2021-08-06 ENCOUNTER — Encounter: Payer: Self-pay | Admitting: Family

## 2021-08-09 ENCOUNTER — Encounter: Payer: Self-pay | Admitting: Family

## 2021-08-09 ENCOUNTER — Other Ambulatory Visit (HOSPITAL_BASED_OUTPATIENT_CLINIC_OR_DEPARTMENT_OTHER): Payer: Self-pay

## 2021-08-16 ENCOUNTER — Other Ambulatory Visit (HOSPITAL_BASED_OUTPATIENT_CLINIC_OR_DEPARTMENT_OTHER): Payer: Self-pay

## 2021-08-26 ENCOUNTER — Other Ambulatory Visit: Payer: Self-pay | Admitting: Hematology & Oncology

## 2021-08-26 ENCOUNTER — Other Ambulatory Visit (HOSPITAL_BASED_OUTPATIENT_CLINIC_OR_DEPARTMENT_OTHER): Payer: Self-pay

## 2021-08-26 DIAGNOSIS — R112 Nausea with vomiting, unspecified: Secondary | ICD-10-CM

## 2021-08-26 DIAGNOSIS — C163 Malignant neoplasm of pyloric antrum: Secondary | ICD-10-CM

## 2021-08-26 DIAGNOSIS — R110 Nausea: Secondary | ICD-10-CM

## 2021-08-26 DIAGNOSIS — C169 Malignant neoplasm of stomach, unspecified: Secondary | ICD-10-CM

## 2021-08-26 MED ORDER — PROMETHAZINE HCL 25 MG PO TABS: 25.0000 mg | ORAL_TABLET | Freq: Four times a day (QID) | ORAL | 0 refills | Status: DC | PRN

## 2021-08-26 MED ORDER — HYDROCODONE-ACETAMINOPHEN 5-325 MG PO TABS: 1.0000 | ORAL_TABLET | Freq: Four times a day (QID) | ORAL | 0 refills | Status: DC | PRN

## 2021-09-03 ENCOUNTER — Other Ambulatory Visit (HOSPITAL_BASED_OUTPATIENT_CLINIC_OR_DEPARTMENT_OTHER): Payer: Self-pay

## 2021-09-10 ENCOUNTER — Inpatient Hospital Stay: Payer: Medicare Other

## 2021-09-10 ENCOUNTER — Inpatient Hospital Stay: Payer: Medicare Other | Admitting: Family

## 2021-09-15 ENCOUNTER — Inpatient Hospital Stay: Payer: Medicare Other | Admitting: Family

## 2021-09-15 ENCOUNTER — Inpatient Hospital Stay: Payer: Medicare Other

## 2021-09-17 ENCOUNTER — Inpatient Hospital Stay (HOSPITAL_BASED_OUTPATIENT_CLINIC_OR_DEPARTMENT_OTHER): Payer: Medicare Other | Admitting: Family

## 2021-09-17 ENCOUNTER — Inpatient Hospital Stay: Payer: Medicare Other | Attending: Hematology & Oncology

## 2021-09-17 ENCOUNTER — Other Ambulatory Visit: Payer: Self-pay | Admitting: *Deleted

## 2021-09-17 ENCOUNTER — Inpatient Hospital Stay: Payer: Medicare Other

## 2021-09-17 ENCOUNTER — Emergency Department (HOSPITAL_BASED_OUTPATIENT_CLINIC_OR_DEPARTMENT_OTHER): Payer: Medicare Other

## 2021-09-17 ENCOUNTER — Other Ambulatory Visit (HOSPITAL_BASED_OUTPATIENT_CLINIC_OR_DEPARTMENT_OTHER): Payer: Self-pay

## 2021-09-17 ENCOUNTER — Other Ambulatory Visit: Payer: Self-pay | Admitting: Family

## 2021-09-17 ENCOUNTER — Emergency Department (HOSPITAL_BASED_OUTPATIENT_CLINIC_OR_DEPARTMENT_OTHER)
Admission: EM | Admit: 2021-09-17 | Discharge: 2021-09-17 | Disposition: A | Discharge status: Home / Self Care | Payer: Medicare Other | Source: Home / Self Care | Attending: Emergency Medicine | Admitting: Emergency Medicine

## 2021-09-17 ENCOUNTER — Encounter: Payer: Self-pay | Admitting: Family

## 2021-09-17 ENCOUNTER — Encounter (HOSPITAL_BASED_OUTPATIENT_CLINIC_OR_DEPARTMENT_OTHER): Payer: Self-pay

## 2021-09-17 ENCOUNTER — Other Ambulatory Visit: Payer: Self-pay

## 2021-09-17 VITALS — BP 107/71 | HR 78 | Temp 98.2°F | Wt 142.0 lb

## 2021-09-17 DIAGNOSIS — D500 Iron deficiency anemia secondary to blood loss (chronic): Secondary | ICD-10-CM

## 2021-09-17 DIAGNOSIS — Z85028 Personal history of other malignant neoplasm of stomach: Secondary | ICD-10-CM | POA: Insufficient documentation

## 2021-09-17 DIAGNOSIS — W1839XA Other fall on same level, initial encounter: Secondary | ICD-10-CM | POA: Insufficient documentation

## 2021-09-17 DIAGNOSIS — Z9181 History of falling: Secondary | ICD-10-CM | POA: Diagnosis not present

## 2021-09-17 DIAGNOSIS — M25551 Pain in right hip: Secondary | ICD-10-CM | POA: Insufficient documentation

## 2021-09-17 DIAGNOSIS — C169 Malignant neoplasm of stomach, unspecified: Secondary | ICD-10-CM | POA: Diagnosis not present

## 2021-09-17 DIAGNOSIS — M25559 Pain in unspecified hip: Secondary | ICD-10-CM | POA: Insufficient documentation

## 2021-09-17 DIAGNOSIS — C163 Malignant neoplasm of pyloric antrum: Secondary | ICD-10-CM

## 2021-09-17 DIAGNOSIS — Z923 Personal history of irradiation: Secondary | ICD-10-CM | POA: Diagnosis not present

## 2021-09-17 LAB — CBC WITH DIFFERENTIAL (CANCER CENTER ONLY)
Abs Immature Granulocytes: 0.01 10*3/uL (ref 0.00–0.07)
Basophils Absolute: 0 10*3/uL (ref 0.0–0.1)
Basophils Relative: 1 %
Eosinophils Absolute: 0.2 10*3/uL (ref 0.0–0.5)
Eosinophils Relative: 4 %
HCT: 33.3 % — ABNORMAL LOW (ref 36.0–46.0)
Hemoglobin: 10.4 g/dL — ABNORMAL LOW (ref 12.0–15.0)
Immature Granulocytes: 0 %
Lymphocytes Relative: 39 %
Lymphs Abs: 2.3 10*3/uL (ref 0.7–4.0)
MCH: 28.4 pg (ref 26.0–34.0)
MCHC: 31.2 g/dL (ref 30.0–36.0)
MCV: 91 fL (ref 80.0–100.0)
Monocytes Absolute: 0.5 10*3/uL (ref 0.1–1.0)
Monocytes Relative: 8 %
Neutro Abs: 2.8 10*3/uL (ref 1.7–7.7)
Neutrophils Relative %: 48 %
Platelet Count: 258 10*3/uL (ref 150–400)
RBC: 3.66 MIL/uL — ABNORMAL LOW (ref 3.87–5.11)
RDW: 14.1 % (ref 11.5–15.5)
Smear Review: NORMAL
WBC Count: 5.8 10*3/uL (ref 4.0–10.5)
nRBC: 0 % (ref 0.0–0.2)

## 2021-09-17 LAB — CMP (CANCER CENTER ONLY)
ALT: 13 U/L (ref 0–44)
AST: 18 U/L (ref 15–41)
Albumin: 3.8 g/dL (ref 3.5–5.0)
Alkaline Phosphatase: 71 U/L (ref 38–126)
Anion gap: 4 — ABNORMAL LOW (ref 5–15)
BUN: 15 mg/dL (ref 6–20)
CO2: 32 mmol/L (ref 22–32)
Calcium: 9.1 mg/dL (ref 8.9–10.3)
Chloride: 103 mmol/L (ref 98–111)
Creatinine: 1.03 mg/dL — ABNORMAL HIGH (ref 0.44–1.00)
GFR, Estimated: >60 mL/min (ref >60)
Glucose, Bld: 104 mg/dL — ABNORMAL HIGH (ref 70–99)
Potassium: 4.3 mmol/L (ref 3.5–5.1)
Sodium: 139 mmol/L (ref 135–145)
Total Bilirubin: 0.3 mg/dL (ref 0.3–1.2)
Total Protein: 6.3 g/dL — ABNORMAL LOW (ref 6.5–8.1)

## 2021-09-17 LAB — IRON AND IRON BINDING CAPACITY (CC-WL,HP ONLY)
Iron: 56 ug/dL (ref 28–170)
Saturation Ratios: 23 % (ref 10.4–31.8)
TIBC: 245 ug/dL — ABNORMAL LOW (ref 250–450)
UIBC: 189 ug/dL (ref 148–442)

## 2021-09-17 LAB — LACTATE DEHYDROGENASE: LDH: 148 U/L (ref 98–192)

## 2021-09-17 LAB — FERRITIN: Ferritin: 77 ng/mL (ref 11–307)

## 2021-09-17 MED ORDER — PANTOPRAZOLE SODIUM 40 MG PO TBEC
40.0000 mg | DELAYED_RELEASE_TABLET | Freq: Every day | ORAL | 4 refills | Status: DC
  Filled 2021-09-17: qty 90, 90d supply, fill #0

## 2021-09-17 MED ORDER — HEPARIN SOD (PORK) LOCK FLUSH 100 UNIT/ML IV SOLN
500.0000 [IU] | Freq: Once | INTRAVENOUS | Status: AC
  Administered 2021-09-17: 500 [IU] via INTRAVENOUS

## 2021-09-17 MED ORDER — METHOCARBAMOL 500 MG PO TABS
500.0000 mg | ORAL_TABLET | Freq: Two times a day (BID) | ORAL | 0 refills | Status: DC
  Filled 2021-09-17: qty 20, 10d supply, fill #0

## 2021-09-17 MED ORDER — SODIUM CHLORIDE 0.9% FLUSH
10.0000 mL | INTRAVENOUS | Status: AC | PRN
  Administered 2021-09-17: 10 mL via INTRAVENOUS

## 2021-09-17 MED ORDER — LIDOCAINE 5 % EX PTCH
1.0000 | MEDICATED_PATCH | CUTANEOUS | 0 refills | Status: DC
  Filled 2021-09-17 – 2022-08-01 (×2): qty 30, 30d supply, fill #0

## 2021-09-17 NOTE — Progress Notes (Unsigned)
Hematology and Oncology Follow Up Visit  Paula [name] [id number] 08-29-1965 56 y.o. 09/17/2021   Principle Diagnosis:  Stage IIA (T1a, N2, M0) gastric adenocarcinoma diagnosed in July of 2015 Intermittent iron deficiency anemia   Past Therapy: Status post diagnostic laparoscopy, distal gastrectomy with billroth II anastamosis and feeding gastrojejunostomy tube under the care of Dr. Barry Dienes on 09/20/2013 Adjuvant systemic chemotherapy with 5-FU 600 mg/M2 with leucovorin on days 1, 8, 15, 22, 29 and 36 every 8 weeks Adjuvant concurrent chemoradiation with Xeloda 650 mg/M2 by mouth twice a day for 5 days every week with radiation. The patient completed the course of radiotherapy but she declined to take Xeloda during her radiation   Current Therapy:        Observation IV iron as indicated    Interim History:  Paula [name] is ***   ECOG Performance Status: {CHL ONC ECOG LO:[id number]}  Medications:  Allergies as of 09/17/2021       Reactions   Tape Itching   Ok with adhesive and paper tape         Medication List        Accurate as of September 17, 2021 10:11 AM. If you have any questions, ask your nurse or doctor.          STOP taking these medications    levofloxacin 750 MG tablet Commonly known as: Levaquin Stopped by: Lottie Dawson, NP       TAKE these medications    fluticasone 50 MCG/ACT nasal spray Commonly known as: FLONASE Place 1 spray into both nostrils daily.   gabapentin 600 MG tablet Commonly known as: NEURONTIN Take 1 tablet (600 mg total) by mouth 2 (two) times daily.   HYDROcodone-acetaminophen 5-325 MG tablet Commonly known as: NORCO/VICODIN Take 1 tablet by mouth every 6 (six) hours as needed for moderate pain.   lidocaine-prilocaine cream Commonly known as: EMLA Apply 1 application. topically as needed.   nitroGLYCERIN 0.4 MG SL tablet Commonly known as: NITROSTAT Place 1 tablet (0.4 mg total) under the tongue every 5  (five) minutes as needed for chest pain.   pantoprazole 40 MG tablet Commonly known as: PROTONIX Take 1 tablet (40 mg total) by mouth daily.   promethazine 25 MG tablet Commonly known as: PHENERGAN Take 1 tablet (25 mg total) by mouth every 6 (six) hours as needed.   WOMENS MULTIVITAMIN PO Take by mouth.        Allergies:  Allergies  Allergen Reactions   Tape Itching    Ok with adhesive and paper tape     Past Medical History, Surgical history, Social history, and Family History were reviewed and updated.  Review of Systems: All other 10 point review of systems is negative.   Physical Exam:  vitals were not taken for this visit.   Wt Readings from Last 3 Encounters:  09/17/21 142 lb (64.4 kg)  07/24/21 140 lb 3.4 oz (63.6 kg)  04/30/21 134 lb 8 oz (61 kg)    Ocular: Sclerae unicteric, pupils equal, round and reactive to light Ear-nose-throat: Oropharynx clear, dentition fair Lymphatic: No cervical or supraclavicular adenopathy Lungs no rales or rhonchi, good excursion bilaterally Heart regular rate and rhythm, no murmur appreciated Abd soft, nontender, positive bowel sounds MSK no focal spinal tenderness, no joint edema Neuro: non-focal, well-oriented, appropriate affect Breasts:   Lab Results  Component Value Date   WBC 5.8 09/17/2021   HGB 10.4 (L) 09/17/2021   HCT 33.3 (L) 09/17/2021   MCV 91.0  09/17/2021   PLT 258 09/17/2021   Lab Results  Component Value Date   FERRITIN 27 07/09/2021   IRON 44 07/09/2021   TIBC 339 07/09/2021   UIBC 295 07/09/2021   IRONPCTSAT 13 07/09/2021   Lab Results  Component Value Date   RETICCTPCT 1.4 05/12/2020   RBC 3.66 (L) 09/17/2021   No results found for: "KPAFRELGTCHN", "LAMBDASER", "KAPLAMBRATIO" Lab Results  Component Value Date   IGGSERUM 1,273 05/21/2015   IGMSERUM 40 05/21/2015   No results found for: "TOTALPROTELP", "ALBUMINELP", "A1GS", "A2GS", "BETS", "BETA2SER", "GAMS", "MSPIKE", "SPEI"    Chemistry      Component Value Date/Time   NA 138 07/09/2021 1204   NA 140 12/30/2020 1506   NA 140 01/26/2017 1002   NA 140 09/24/2015 0953   K 4.7 07/09/2021 1204   K 3.5 01/26/2017 1002   K 4.0 09/24/2015 0953   CL 101 07/09/2021 1204   CL 106 01/26/2017 1002   CO2 32 07/09/2021 1204   CO2 26 01/26/2017 1002   CO2 23 09/24/2015 0953   BUN 20 07/09/2021 1204   BUN 14 12/30/2020 1506   BUN 6 (L) 01/26/2017 1002   BUN 8.6 09/24/2015 0953   CREATININE 1.03 (H) 07/09/2021 1204   CREATININE 1.1 01/26/2017 1002   CREATININE 1.0 09/24/2015 0953      Component Value Date/Time   CALCIUM 9.2 07/09/2021 1204   CALCIUM 8.9 01/26/2017 1002   CALCIUM 9.6 09/24/2015 0953   ALKPHOS 68 07/09/2021 1204   ALKPHOS 68 01/26/2017 1002   ALKPHOS 99 09/24/2015 0953   AST 16 07/09/2021 1204   AST 17 09/24/2015 0953   ALT 14 07/09/2021 1204   ALT 17 01/26/2017 1002   ALT 11 09/24/2015 0953   BILITOT 0.3 07/09/2021 1204   BILITOT 0.83 09/24/2015 0953       Impression and Plan:   Lottie Dawson, NP 7/28/202310:11 AM

## 2021-09-17 NOTE — ED Notes (Signed)
Dc instructions and scripts reviewed with pt no questions or concerns at this time.  

## 2021-09-17 NOTE — Patient Instructions (Signed)

## 2021-09-17 NOTE — ED Notes (Signed)
Attempted to call- no answer

## 2021-09-17 NOTE — Addendum Note (Signed)
Addended by: Rico Ala on: 09/17/2021 10:25 AM   Modules accepted: Orders

## 2021-09-17 NOTE — ED Provider Notes (Signed)
[facility] EMERGENCY DEPARTMENT Provider Note   CSN: [id number] Arrival date & time: 09/17/21  1029     History  Chief Complaint  Patient presents with   Hip Pain    Paula [name] is a 56 y.o. female with a past medical history of gastric cancer status post partial gastrectomy and hypocalcemia presenting today with complaint of right hip pain after a fall.  She reports that this fall occurred a week ago while with her oncologist.  They suggested that she get checked out due to the extent of her bruising.  She says the bruising has resolved but she still has some tenderness to the hip with palpation and pain while ambulating.  She is on hydrocodone chronically and this is not helping her symptoms.   Hip Pain       Home Medications Prior to Admission medications   Medication Sig Start Date End Date Taking? Authorizing Provider  lidocaine (LIDODERM) 5 % Place 1 patch onto the skin daily. Remove & Discard patch within 12 hours or as directed by MD 09/17/21  Yes Shavell Nored A, PA-C  methocarbamol (ROBAXIN) 500 MG tablet Take 1 tablet (500 mg total) by mouth 2 (two) times daily. 09/17/21  Yes Malon Branton A, PA-C  fluticasone (FLONASE) 50 MCG/ACT nasal spray Place 1 spray into both nostrils daily. Patient not taking: Reported on 09/17/2021 12/10/18   Delia Heady, PA-C  gabapentin (NEURONTIN) 600 MG tablet Take 1 tablet (600 mg total) by mouth 2 (two) times daily. 05/18/21 07/17/21  Anyanwu, Sallyanne Havers, MD  HYDROcodone-acetaminophen (NORCO/VICODIN) 5-325 MG tablet Take 1 tablet by mouth every 6 (six) hours as needed for moderate pain. 08/26/21   Volanda Napoleon, MD  lidocaine-prilocaine (EMLA) cream Apply 1 application. topically as needed. 07/27/21   Volanda Napoleon, MD  Multiple Vitamins-Minerals (WOMENS MULTIVITAMIN PO) Take by mouth.    [provider]  nitroGLYCERIN (NITROSTAT) 0.4 MG SL tablet Place 1 tablet (0.4 mg total) under the tongue every 5  (five) minutes as needed for chest pain. Patient not taking: No sig reported 12/20/18 05/15/19  Lendon Colonel, NP  pantoprazole (PROTONIX) 40 MG tablet Take 1 tablet (40 mg total) by mouth daily. 09/17/21   Volanda Napoleon, MD  promethazine (PHENERGAN) 25 MG tablet Take 1 tablet (25 mg total) by mouth every 6 (six) hours as needed. 08/26/21   Volanda Napoleon, MD      Allergies    Tape    Review of Systems   Review of Systems  Physical Exam Updated Vital Signs BP 123/82   Pulse 81   Resp 16   Ht '5\' 1"'$  (1.549 m)   Wt 64.4 kg   LMP 02/28/2016 (Exact Date)   SpO2 99%   BMI 26.83 kg/m  Physical Exam Vitals and nursing note reviewed.  Constitutional:      Appearance: Normal appearance.  HENT:     Head: Normocephalic and atraumatic.  Eyes:     General: No scleral icterus.    Conjunctiva/sclera: Conjunctivae normal.  Pulmonary:     Effort: Pulmonary effort is normal. No respiratory distress.  Musculoskeletal:     Comments: Full range of motion of the right hip.  Some midline lumbar spine tenderness.  Strong bilateral DP pulses.  No bruising or lacerations to the skin.  No deformities  Skin:    Findings: No rash.  Neurological:     Mental Status: She is alert.  Psychiatric:  Mood and Affect: Mood normal.     ED Results / Procedures / Treatments   Labs (all labs ordered are listed, but only abnormal results are displayed) Labs Reviewed - No data to display  EKG None  Radiology DG Lumbar Spine Complete  Result Date: 09/17/2021 CLINICAL DATA:  Pain status post fall 1 week ago EXAM: LUMBAR SPINE - COMPLETE 4+ VIEW COMPARISON:  MRI lumbar spine 04/29/2017 FINDINGS: Surgical suture noted in the left upper quadrant. Aerated material in the region of the stomach likely related to recently ingested medial. L5 vertebral body is partially sacralized. Alignment is within normal limits. No vertebral body height loss. Mild facet degenerative changes present at L3-L4, L4-L5,  L5-S1. IMPRESSION: 1. No acute abnormality of the lumbar spine. 2. Mild facet degenerative changes seen in the lower lumbar spine. Electronically Signed   By: Miachel Roux M.D.   On: 09/17/2021 11:10   DG Hip Unilat  With Pelvis 2-3 Views Right  Result Date: 09/17/2021 CLINICAL DATA:  Pain after fall 1 week ago EXAM: DG HIP (WITH OR WITHOUT PELVIS) 2-3V RIGHT COMPARISON:  CT abdomen and pelvis 12/19/2018. FINDINGS: There is no evidence of hip fracture or dislocation. There is no evidence of arthropathy or other focal bone abnormality. IMPRESSION: Negative. Electronically Signed   By: Placido Sou M.D.   On: 09/17/2021 11:07    Procedures Procedures   Medications Ordered in ED Medications - No data to display  ED Course/ Medical Decision Making/ A&P                           Medical Decision Making Amount and/or Complexity of Data Reviewed Radiology: ordered.  Risk Prescription drug management.   56 year old female presenting with right hip pain after fall.  Fall was a week ago.  She is still having pain that is not relieved by her hydrocodone  Imaging: X-ray of the lumbar spine and right hip viewed and interpreted by me.  Hip was negative, some signs of degeneration in the lumbar spine.  I agree with the radiologist  Treatment: She is already on hydrocodone and will not treat her with muscle relaxants in the department due to drowsiness.  She also should not have NSAIDs due to her gastrectomy.  No medications ordered at the time  MDM/disposition: I believe patient would benefit from muscle relaxants to relax the right hip muscles and allow for more comfort with ambulation.  I have sent Robaxin to the pharmacy.  I also sent lidocaine patches to the pharmacy for her to wear as needed for pain.  She is agreeable to this plan and will follow-up with her PCP.  Additionally she reported feeling fatigued.  She had iron levels drawn by her oncologist this morning and these are pending.  She  has not had vitamin D, B12 or TSH drawn in a while.  She will follow-up with PCP.  No further testing indicated from the emergency department today.  Stable for discharge  Final Clinical Impression(s) / ED Diagnoses Final diagnoses:  Right hip pain    Rx / DC Orders ED Discharge Orders          Ordered    methocarbamol (ROBAXIN) 500 MG tablet  2 times daily        09/17/21 1155    lidocaine (LIDODERM) 5 %  Every 24 hours        09/17/21 1156  Results and diagnoses were explained to the patient. Return precautions discussed in full. Patient had no additional questions and expressed complete understanding.   This chart was dictated using voice recognition software.  Despite best efforts to proofread,  errors can occur which can change the documentation meaning.    Darliss Ridgel 09/17/21 1206    Lucrezia Starch, MD 09/18/21 [phone]

## 2021-09-17 NOTE — ED Triage Notes (Signed)
Patient c/o right hip pain s/p fall x 1 week ago but states is still having pain. Denies head injury. Denies blood thinners.

## 2021-09-17 NOTE — Progress Notes (Unsigned)
Hematology and Oncology Follow Up Visit  Paula [name] [id number] 03-18-1965 56 y.o. 09/17/2021   Principle Diagnosis:  Stage IIA (T1a, N2, M0) gastric adenocarcinoma diagnosed in July of 2015 Intermittent iron deficiency anemia   Past Therapy: Status post diagnostic laparoscopy, distal gastrectomy with billroth II anastamosis and feeding gastrojejunostomy tube under the care of Dr. Barry [name] on 09/20/2013 Adjuvant systemic chemotherapy with 5-FU 600 mg/M2 with leucovorin on days 1, 8, 15, 22, 29 and 36 every 8 weeks Adjuvant concurrent chemoradiation with Xeloda 650 mg/M2 by mouth twice a day for 5 days every week with radiation. The patient completed the course of radiotherapy but she declined to take Xeloda during her radiation   Current Therapy:        Observation IV iron as indicated   Interim History:  [name] is here today for follow-up. She notes feeling fatigued recently.  She states that she had a fall 2 weeks ago but is still having hip pain. She plans to go down to the ED for further eval.  No swelling noted in her extremities at this time.  Mild SOB with over exertion at times.  No fever, chills, n/v, cough, rash, dizziness, chest pain, palpitations, abdominal pain or changes in bowel or bladder habits.  No blood loss, abnormal bruising or petechiae noted.  Her appetite is ok and she is doing her best to stay well hydrated. Her weight is stable at 142 lbs.   ECOG Performance Status: 1 - Symptomatic but completely ambulatory  Medications:  Allergies as of 09/17/2021       Reactions   Tape Itching   Ok with adhesive and paper tape         Medication List        Accurate as of September 17, 2021 10:05 AM. If you have any questions, ask your nurse or doctor.          STOP taking these medications    levofloxacin 750 MG tablet Commonly known as: Levaquin Stopped by: Paula Dawson, NP       TAKE these medications    fluticasone 50 MCG/ACT  nasal spray Commonly known as: FLONASE Place 1 spray into both nostrils daily.   gabapentin 600 MG tablet Commonly known as: NEURONTIN Take 1 tablet (600 mg total) by mouth 2 (two) times daily.   HYDROcodone-acetaminophen 5-325 MG tablet Commonly known as: NORCO/VICODIN Take 1 tablet by mouth every 6 (six) hours as needed for moderate pain.   lidocaine-prilocaine cream Commonly known as: EMLA Apply 1 application. topically as needed.   nitroGLYCERIN 0.4 MG SL tablet Commonly known as: NITROSTAT Place 1 tablet (0.4 mg total) under the tongue every 5 (five) minutes as needed for chest pain.   pantoprazole 40 MG tablet Commonly known as: PROTONIX Take 1 tablet (40 mg total) by mouth daily.   promethazine 25 MG tablet Commonly known as: PHENERGAN Take 1 tablet (25 mg total) by mouth every 6 (six) hours as needed.   WOMENS MULTIVITAMIN PO Take by mouth.        Allergies:  Allergies  Allergen Reactions   Tape Itching    Ok with adhesive and paper tape     Past Medical History, Surgical history, Social history, and Family History were reviewed and updated.  Review of Systems: All other 10 point review of systems is negative.   Physical Exam:  weight is 142 lb (64.4 kg). Her oral temperature is 98.2 F (36.8 C). Her blood pressure is 107/71 and  her pulse is 78. Her oxygen saturation is 100%.   Wt Readings from Last 3 Encounters:  09/17/21 142 lb (64.4 kg)  07/24/21 140 lb 3.4 oz (63.6 kg)  04/30/21 134 lb 8 oz (61 kg)    Ocular: Sclerae unicteric, pupils equal, round and reactive to light Ear-nose-throat: Oropharynx clear, dentition fair Lymphatic: No cervical or supraclavicular adenopathy Lungs no rales or rhonchi, good excursion bilaterally Heart regular rate and rhythm, no murmur appreciated Abd soft, nontender, positive bowel sounds MSK no focal spinal tenderness, no joint edema Neuro: non-focal, well-oriented, appropriate affect Breasts: Deferred   Lab  Results  Component Value Date   WBC 5.8 09/17/2021   HGB 10.4 (L) 09/17/2021   HCT 33.3 (L) 09/17/2021   MCV 91.0 09/17/2021   PLT 258 09/17/2021   Lab Results  Component Value Date   FERRITIN 27 07/09/2021   IRON 44 07/09/2021   TIBC 339 07/09/2021   UIBC 295 07/09/2021   IRONPCTSAT 13 07/09/2021   Lab Results  Component Value Date   RETICCTPCT 1.4 05/12/2020   RBC 3.66 (L) 09/17/2021   No results found for: "KPAFRELGTCHN", "LAMBDASER", "KAPLAMBRATIO" Lab Results  Component Value Date   IGGSERUM 1,273 05/21/2015   IGMSERUM 40 05/21/2015   No results found for: "TOTALPROTELP", "ALBUMINELP", "A1GS", "A2GS", "BETS", "BETA2SER", "GAMS", "MSPIKE", "SPEI"   Chemistry      Component Value Date/Time   NA 138 07/09/2021 1204   NA 140 12/30/2020 1506   NA 140 01/26/2017 1002   NA 140 09/24/2015 0953   K 4.7 07/09/2021 1204   K 3.5 01/26/2017 1002   K 4.0 09/24/2015 0953   CL 101 07/09/2021 1204   CL 106 01/26/2017 1002   CO2 32 07/09/2021 1204   CO2 26 01/26/2017 1002   CO2 23 09/24/2015 0953   BUN 20 07/09/2021 1204   BUN 14 12/30/2020 1506   BUN 6 (L) 01/26/2017 1002   BUN 8.6 09/24/2015 0953   CREATININE 1.03 (H) 07/09/2021 1204   CREATININE 1.1 01/26/2017 1002   CREATININE 1.0 09/24/2015 0953      Component Value Date/Time   CALCIUM 9.2 07/09/2021 1204   CALCIUM 8.9 01/26/2017 1002   CALCIUM 9.6 09/24/2015 0953   ALKPHOS 68 07/09/2021 1204   ALKPHOS 68 01/26/2017 1002   ALKPHOS 99 09/24/2015 0953   AST 16 07/09/2021 1204   AST 17 09/24/2015 0953   ALT 14 07/09/2021 1204   ALT 17 01/26/2017 1002   ALT 11 09/24/2015 0953   BILITOT 0.3 07/09/2021 1204   BILITOT 0.83 09/24/2015 0953       Impression and Plan: [name] is a pleasant 56 yo African American female with history of stage II gastric cancer and partial gastrectomy followed by adjuvant chemo and radiation. She will go to the ED after our visit for evaluation for hip pain post fall 2 weeks  ago.  Iron studies are pending. We will replace if needed.  Port flush every 2 month, follow-up in 6 months.   Paula Dawson, NP 7/28/202310:05 AM

## 2021-09-17 NOTE — Discharge Instructions (Addendum)
Consider getting TSH, B12 and Vitamin D checked by your PCP.  I sent lidocaine patches and muscle relaxants to your pharmacy.  As we discussed, the muscle relaxants may make you drowsy.  The lidocaine patches can only be worn for 12 hours at a time.  You must have 12 hours without a patch.  You may use multiple patches at the same time.

## 2021-09-19 ENCOUNTER — Encounter: Payer: Self-pay | Admitting: Family

## 2021-09-20 ENCOUNTER — Other Ambulatory Visit (HOSPITAL_BASED_OUTPATIENT_CLINIC_OR_DEPARTMENT_OTHER): Payer: Self-pay

## 2021-09-21 ENCOUNTER — Other Ambulatory Visit (HOSPITAL_BASED_OUTPATIENT_CLINIC_OR_DEPARTMENT_OTHER): Payer: Self-pay

## 2021-09-22 ENCOUNTER — Other Ambulatory Visit (HOSPITAL_BASED_OUTPATIENT_CLINIC_OR_DEPARTMENT_OTHER): Payer: Self-pay

## 2021-09-23 ENCOUNTER — Other Ambulatory Visit (HOSPITAL_BASED_OUTPATIENT_CLINIC_OR_DEPARTMENT_OTHER): Payer: Self-pay

## 2021-09-28 ENCOUNTER — Other Ambulatory Visit (HOSPITAL_BASED_OUTPATIENT_CLINIC_OR_DEPARTMENT_OTHER): Payer: Self-pay

## 2021-09-28 ENCOUNTER — Other Ambulatory Visit: Payer: Self-pay | Admitting: *Deleted

## 2021-09-28 DIAGNOSIS — C163 Malignant neoplasm of pyloric antrum: Secondary | ICD-10-CM

## 2021-09-28 DIAGNOSIS — C169 Malignant neoplasm of stomach, unspecified: Secondary | ICD-10-CM

## 2021-09-28 MED ORDER — HYDROCODONE-ACETAMINOPHEN 5-325 MG PO TABS: 1.0000 | ORAL_TABLET | Freq: Four times a day (QID) | ORAL | 0 refills | Status: DC | PRN

## 2021-10-07 ENCOUNTER — Other Ambulatory Visit: Payer: Self-pay | Admitting: Obstetrics & Gynecology

## 2021-10-07 DIAGNOSIS — N951 Menopausal and female climacteric states: Secondary | ICD-10-CM

## 2021-10-08 ENCOUNTER — Telehealth: Payer: Self-pay | Admitting: Obstetrics & Gynecology

## 2021-10-08 NOTE — Telephone Encounter (Signed)
Patient requesting a RX for her Hot Flashes.    Paula [name]

## 2021-10-08 NOTE — Telephone Encounter (Addendum)
Call returned to pt regarding her request for medication to assist with hot flashes and she did not answer. A message was left requesting her to call back or send a Mychart message. Pt also has a refill request from pharmacy for Gabapentin.

## 2021-10-11 MED ORDER — GABAPENTIN 600 MG PO TABS: 600.0000 mg | ORAL_TABLET | Freq: Two times a day (BID) | ORAL | 0 refills | Status: DC

## 2021-10-11 NOTE — Telephone Encounter (Signed)
Rx sent for 1 month per Dr Kennon Rounds. Will need to keep Appt on 9/25 with Dr A for further refills.  Paula [name]

## 2021-10-11 NOTE — Telephone Encounter (Signed)
Heard back from Dr Elly Modena. Rx refused. Pt needs to be seen in office by provider before refill can be approved.  Mychart message sent back to pt.  Paula [name]

## 2021-10-11 NOTE — Telephone Encounter (Signed)
Sent Dr Lucie Leather a staff message for recommendations since patient has not been seen in office since 2022. Awaiting message back.  Paula [name]

## 2021-10-29 ENCOUNTER — Other Ambulatory Visit: Payer: Self-pay | Admitting: Hematology & Oncology

## 2021-10-29 DIAGNOSIS — C169 Malignant neoplasm of stomach, unspecified: Secondary | ICD-10-CM

## 2021-10-29 DIAGNOSIS — C163 Malignant neoplasm of pyloric antrum: Secondary | ICD-10-CM

## 2021-10-29 MED ORDER — HYDROCODONE-ACETAMINOPHEN 5-325 MG PO TABS: 1.0000 | ORAL_TABLET | Freq: Four times a day (QID) | ORAL | 0 refills | Status: DC | PRN

## 2021-10-29 NOTE — Telephone Encounter (Signed)
Last refilled 09/28/21. Please advise, thanks!

## 2021-11-12 ENCOUNTER — Other Ambulatory Visit: Payer: Self-pay | Admitting: Family Medicine

## 2021-11-12 DIAGNOSIS — N951 Menopausal and female climacteric states: Secondary | ICD-10-CM

## 2021-11-15 ENCOUNTER — Encounter: Payer: Self-pay | Admitting: Obstetrics & Gynecology

## 2021-11-15 ENCOUNTER — Other Ambulatory Visit: Payer: Self-pay

## 2021-11-15 ENCOUNTER — Ambulatory Visit (INDEPENDENT_AMBULATORY_CARE_PROVIDER_SITE_OTHER): Payer: Medicare Other | Admitting: Obstetrics & Gynecology

## 2021-11-15 DIAGNOSIS — Z23 Encounter for immunization: Secondary | ICD-10-CM | POA: Diagnosis not present

## 2021-11-15 DIAGNOSIS — N951 Menopausal and female climacteric states: Secondary | ICD-10-CM

## 2021-11-15 MED ORDER — GABAPENTIN 600 MG PO TABS: 600.0000 mg | ORAL_TABLET | Freq: Two times a day (BID) | ORAL | 7 refills | Status: DC

## 2021-11-15 NOTE — Progress Notes (Signed)
GYNECOLOGY OFFICE VISIT NOTE  History:   Paula [name] is a 56 y.o. [id number] here today for refill of Neurontin for her menopausal vasomotor symptoms.  No other concerns.  She denies any abnormal vaginal discharge, bleeding, pelvic pain or other concerns.    Past Medical History:  Diagnosis Date   Anxiety    per pt   Arthritis    back   Barrett esophagus    Costochondritis    Esophageal reflux    On omeprazole   Family history of cancer    Headache(784.0) 2011   Migraines   Heart murmur    never caused any problems   History of hiatal hernia    History of radiation therapy 05/12/14- 06/17/14   post-op gastric region/ nodes/ 45 Gy in 25 fractions.    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 [id number]   Wears partial dentures    upper dentures    Past Surgical History:  Procedure Laterality Date   CESAREAN SECTION     x 2   ESOPHAGOGASTRODUODENOSCOPY N/A 09/16/2013   Procedure: ESOPHAGOGASTRODUODENOSCOPY (EGD);  Surgeon: Wonda Horner, MD;  Location: [facility];  Service: Endoscopy;  Laterality: N/A;   ESOPHAGOGASTRODUODENOSCOPY  12/09/2019   [facility]   GASTROSTOMY     r/t stomach cancer   IR CV LINE INJECTION  08/30/2017   IR FLUORO GUIDE PORT INSERTION RIGHT  03/21/2017   IR US GUIDE VASC ACCESS RIGHT  03/21/2017   LAPAROSCOPY N/A 09/20/2013   Procedure: DIAGNOSTIC LAPAROSCOPY,DISTAL GASTRECTOMY AND FEEDING GASTROJEJUNOSTOMY;  Surgeon: [name], MD;  Location: [facility];  Service: General;  Laterality: N/A;   OOPHORECTOMY     Around 1985 left ovary removal   PORTACATH PLACEMENT Left 11/18/2013   Procedure: INSERTION PORT-A-CATH;  Surgeon: [name], MD;  Location: [facility];  Service:  General;  Laterality: Left;   TONSILLECTOMY     Around 1994   TUBAL LIGATION     UNILATERAL SALPINGECTOMY Left 11/09/2017   Procedure: UNILATERAL SALPINGECTOMY;  Surgeon: Osborne Oman, MD;  Location: [facility];  Service: Gynecology;  Laterality: Left;   UPPER GASTROINTESTINAL ENDOSCOPY     VAGINAL HYSTERECTOMY Left 11/09/2017   Procedure: HYSTERECTOMY VAGINAL WITH LEFT SALPINGECTOMY;  Surgeon: Osborne Oman, MD;  Location: [facility];  Service: Gynecology;  Laterality: Left;    The following portions of the patient's history were reviewed and updated as appropriate: allergies, current medications, past family history, past medical history, past social history, past surgical history and problem list.    Review of Systems:  Pertinent items noted in HPI and remainder of comprehensive ROS otherwise negative.  Physical Exam:  BP 120/80   Pulse 98   Wt 139 lb 6.4 oz (63.2 kg)   LMP 02/28/2016 (Exact Date)   BMI 26.34 kg/m  CONSTITUTIONAL: Well-developed, well-nourished female in no acute distress.  HEENT:  Normocephalic, atraumatic. External right and left ear normal. No scleral icterus.  NECK: Normal range of motion, supple, no masses noted on observation SKIN: No rash noted. Not diaphoretic. No erythema. No pallor. MUSCULOSKELETAL: Normal range of motion. No edema noted. NEUROLOGIC: Alert and oriented  to person, place, and time. Normal muscle tone coordination. No cranial nerve deficit noted. PSYCHIATRIC: Normal mood and affect. Normal behavior. Normal judgment and thought content. CARDIOVASCULAR: Normal heart rate noted RESPIRATORY: Effort and breath sounds normal, no problems with respiration noted ABDOMEN: No masses noted. No other overt distention noted.   PELVIC: Deferred     Assessment and Plan:    1. Menopausal vasomotor syndrome - gabapentin (NEURONTIN) 600 MG tablet; Take 1 tablet (600 mg total) by mouth 2 (two) times daily.  Dispense: 180 tablet; Refill: 7 Refilled  Neurontin for patient Further refills can be done without annual examinations for this patient, she prefers virtual visits if needed. She is s/p vaginal hysterectomy for benign reasons, no reason for pap and is up to date for all other issues Routine preventative health maintenance measures emphasized. Please refer to After Visit Summary for other counseling recommendations.   Return for as needed.    I spent 20 minutes dedicated to the care of this patient including pre-visit review of records, face to face time with the patient discussing her conditions and treatments and post visit orders.    Verita Schneiders, MD, [place] for [employer], [place]

## 2021-11-18 ENCOUNTER — Inpatient Hospital Stay: Payer: Medicare Other | Attending: Hematology & Oncology

## 2021-11-18 VITALS — BP 138/76 | HR 83 | Temp 98.0°F | Resp 17

## 2021-11-18 DIAGNOSIS — Z452 Encounter for adjustment and management of vascular access device: Secondary | ICD-10-CM | POA: Insufficient documentation

## 2021-11-18 DIAGNOSIS — Z85028 Personal history of other malignant neoplasm of stomach: Secondary | ICD-10-CM | POA: Insufficient documentation

## 2021-11-18 DIAGNOSIS — Z95828 Presence of other vascular implants and grafts: Secondary | ICD-10-CM

## 2021-11-18 MED ORDER — HEPARIN SOD (PORK) LOCK FLUSH 100 UNIT/ML IV SOLN
500.0000 [IU] | Freq: Once | INTRAVENOUS | Status: AC
  Administered 2021-11-18: 500 [IU] via INTRAVENOUS

## 2021-11-18 MED ORDER — SODIUM CHLORIDE 0.9% FLUSH
10.0000 mL | Freq: Once | INTRAVENOUS | Status: AC
  Administered 2021-11-18: 10 mL via INTRAVENOUS

## 2021-11-18 NOTE — Patient Instructions (Signed)

## 2021-11-30 ENCOUNTER — Other Ambulatory Visit: Payer: Self-pay

## 2021-11-30 DIAGNOSIS — C169 Malignant neoplasm of stomach, unspecified: Secondary | ICD-10-CM

## 2021-11-30 DIAGNOSIS — C163 Malignant neoplasm of pyloric antrum: Secondary | ICD-10-CM

## 2021-11-30 MED ORDER — HYDROCODONE-ACETAMINOPHEN 5-325 MG PO TABS: 1.0000 | ORAL_TABLET | Freq: Four times a day (QID) | ORAL | 0 refills | Status: DC | PRN

## 2021-12-29 ENCOUNTER — Other Ambulatory Visit: Payer: Self-pay | Admitting: Hematology & Oncology

## 2021-12-29 DIAGNOSIS — C163 Malignant neoplasm of pyloric antrum: Secondary | ICD-10-CM

## 2021-12-29 DIAGNOSIS — C169 Malignant neoplasm of stomach, unspecified: Secondary | ICD-10-CM

## 2021-12-29 MED ORDER — HYDROCODONE-ACETAMINOPHEN 5-325 MG PO TABS: 1.0000 | ORAL_TABLET | Freq: Four times a day (QID) | ORAL | 0 refills | Status: DC | PRN

## 2022-01-17 ENCOUNTER — Telehealth: Payer: Self-pay | Admitting: *Deleted

## 2022-01-17 NOTE — Telephone Encounter (Signed)
PAtient called to let us know she has been throwing up for 2 months.  She throws up whenever she eats.  She has been taking Protonix and Phenergan.  Feels she has lost some weight.  Dr Marin Olp notified.  Wants patient to call Dr Lyndel Safe her Gastroenterologist about this.  Patient notified and in agreement

## 2022-01-18 ENCOUNTER — Inpatient Hospital Stay: Payer: Medicare Other

## 2022-01-20 ENCOUNTER — Ambulatory Visit (INDEPENDENT_AMBULATORY_CARE_PROVIDER_SITE_OTHER): Payer: Medicare Other | Admitting: Gastroenterology

## 2022-01-20 ENCOUNTER — Other Ambulatory Visit: Payer: Medicare Other

## 2022-01-20 ENCOUNTER — Encounter: Payer: Self-pay | Admitting: Gastroenterology

## 2022-01-20 VITALS — BP 98/76 | HR 97 | Ht 61.0 in | Wt 140.0 lb

## 2022-01-20 DIAGNOSIS — R112 Nausea with vomiting, unspecified: Secondary | ICD-10-CM

## 2022-01-20 DIAGNOSIS — R110 Nausea: Secondary | ICD-10-CM | POA: Diagnosis not present

## 2022-01-20 DIAGNOSIS — K2270 Barrett's esophagus without dysplasia: Secondary | ICD-10-CM | POA: Diagnosis not present

## 2022-01-20 DIAGNOSIS — D509 Iron deficiency anemia, unspecified: Secondary | ICD-10-CM

## 2022-01-20 MED ORDER — PROMETHAZINE HCL 25 MG PO TABS: 25.0000 mg | ORAL_TABLET | Freq: Two times a day (BID) | ORAL | 3 refills | Status: DC

## 2022-01-20 MED ORDER — PANTOPRAZOLE SODIUM 40 MG PO TBEC: 40.0000 mg | DELAYED_RELEASE_TABLET | Freq: Every day | ORAL | 4 refills | Status: DC

## 2022-01-20 NOTE — Progress Notes (Signed)
Chief Complaint: N/V  Referring Provider:  Marcie Mowers, FNP      ASSESSMENT AND PLAN;   #1. N/V   Prev neg CT AP 12/19/2018 for recurrence of gastric cancer.  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 Nl TSH. EGD Jan 2021 with 2 cm Barrett's esophagus, small gastric remnant with angular takeoff of afferent/efferent limbs. Refused revision (Dr Barry Dienes). Has stopped all marijuana and smoking.  Already I have not yet  #2. Stage IIA (T1a, N2, M0) gastric AdenoCa s/p BII gastrectomy/feeding J tube July 2015 followed by neoadjuvant chemoradiation (Dr Barry Dienes). Neg CT AP 12/19/2018. EGD 01/2018, 02/2019 - as below. Neg for recurrence.  #3. 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  #4. Chronic constipation.  Refuses screening colon.  Plan: -EGD -Phenegren 64m po BID to continue #60. Can use it PRN.  Sedation/fall precautions. -Continue protonix 433mPO QD #90, 4 refills -CBC, CMP, lipase, ferrintin, LDH, iron studies -I have encouraged her to eat small but more frequent meals 6-7 times/day.   -Minimize narcotics. -Take exlax everyday   HPI:    Paula [name] is a [age].o. female  With H/O stage II gastric AdenoCa s/p Billroth II gastrectomy 2015 followed by neoadjuvant chemoradiation.  Has been having persistent abdominal pain, intermittent N/V ever since. Neg CT AP as below 11/2018. EGD 02/2019 showed small gastric remnant with moderate gastritis (neg Bx), angular takeoff of the efferent limb.  2 cm Barrett's esophagus without dysplasia.  Refused revision  Again N/V x 1 month, mostly in AM.  Then it gets better. She has stopped taking Protonix on her own.  Has been having heartburn as well. Due for repeat EGD for follow-up of Barrett's. Has stopped all mariuana/ smoking Phenergan does help.  No odynophagia or dysphagia.  No GI bleeding.  Phenergan helps with nausea/vomiting.  She takes it twice a day.  Overall has been eating better.   She could not tolerate Ensure.  She is currently taking milkshakes 2/day.  Has gained weight as below.  Has constipation related to narcotics.  BMs 1/week which are hard to pass.  She does have associated abdominal bloating.  Has been taking Ex-Lax once per week.  Has recently quit smoking. No nonsteroidals.  No weight loss.  Wt Readings from Last 3 Encounters:  01/20/22 140 lb (63.5 kg)  11/15/21 139 lb 6.4 oz (63.2 kg)  09/17/21 141 lb 15.6 oz (64.4 kg)     From previous notes:  Adm to [facility] with UGI bleed s/p EGD 12/09/2019 by Dr. ShKirby Crigleras negative except chronic findings.  She was managed conservatively.  CT Abdo/pelvis in addition to above findings did show pancreatic atrophy.  A trial of pancreatic enzymes was tried without any improvement.    Wt Readings from Last 3 Encounters:  01/20/22 140 lb (63.5 kg)  11/15/21 139 lb 6.4 oz (63.2 kg)  09/17/21 141 lb 15.6 oz (64.4 kg)     Previous GI procedures:  Underwent CT Abdo/pelvis on 01/19/2021, 12/19/2018 which did not show any acute abnormalities.  No recurrence.  EGD 03/06/2019 - Moderate gastritis. (Likely etiology of recurrent anemia). - Likely 2 cm Barrett's esophagus (biopsied) - Patent Billroth II gastrojejunostomy with normal afferent/efferent limbs. Did have angular takeoff. -No recurrence of gastric cancer. -Bx: Eso: Barrett's esophagus without dysplasia. Gastric: Gastritis with negative HP.  EGD 02/07/2018 [facility] -Salmon-colored mucosa extending from 37 to 38 cm. -Billroth II gastrectomy.  Angular takeoff of both limbs. -Gastritis (Bx- neg) -Single  3 mm gastric polyp at anastomosis (Bx-hyperplastic polyp) -Small amount of food residue in stomach.  Bilious gastric fluid. -Dilated lacteals in the duodenum (Bx- neg, neg for celiac) -No recurrence.  CTA chest/Abdo/pelvis 02/23/2019 1. No acute thoracic, abdominal or pelvic pathology. Specifically, no evidence for aortic dissection or aneurysm. 2. Chronic  findings as above including Port-A-Cath, postsurgical changes  Refused colonoscopy.  I talked to her today as well. Past Medical History:  Diagnosis Date   Anxiety    per pt   Arthritis    back   Barrett esophagus    Costochondritis    Esophageal reflux    On omeprazole   Family history of cancer    Headache(784.0) 2011   Migraines   Heart murmur    never caused any problems   History of hiatal hernia    History of radiation therapy 05/12/14- 06/17/14   post-op gastric region/ nodes/ 45 Gy in 25 fractions.    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 [id number]   Wears partial dentures    upper dentures    Past Surgical History:  Procedure Laterality Date   CESAREAN SECTION     x 2   ESOPHAGOGASTRODUODENOSCOPY N/A 09/16/2013   Procedure: ESOPHAGOGASTRODUODENOSCOPY (EGD);  Surgeon: Wonda Horner, MD;  Location: [facility];  Service: Endoscopy;  Laterality: N/A;   ESOPHAGOGASTRODUODENOSCOPY  12/09/2019   [facility]   GASTROSTOMY     r/t stomach cancer   IR CV LINE INJECTION  08/30/2017   IR FLUORO GUIDE PORT INSERTION RIGHT  03/21/2017   IR US GUIDE VASC ACCESS RIGHT  03/21/2017   LAPAROSCOPY N/A 09/20/2013   Procedure: DIAGNOSTIC LAPAROSCOPY,DISTAL GASTRECTOMY AND FEEDING GASTROJEJUNOSTOMY;  Surgeon: [name], MD;  Location: [facility];  Service: General;  Laterality: N/A;   OOPHORECTOMY     Around 1985 left ovary removal   PORTACATH PLACEMENT Left 11/18/2013   Procedure: INSERTION PORT-A-CATH;  Surgeon: [name], MD;  Location: [facility];  Service: General;  Laterality: Left;   TONSILLECTOMY     Around 1994   TUBAL LIGATION     UNILATERAL SALPINGECTOMY Left 11/09/2017   Procedure: UNILATERAL  SALPINGECTOMY;  Surgeon: Osborne Oman, MD;  Location: [facility];  Service: Gynecology;  Laterality: Left;   UPPER GASTROINTESTINAL ENDOSCOPY     VAGINAL HYSTERECTOMY Left 11/09/2017   Procedure: HYSTERECTOMY VAGINAL WITH LEFT SALPINGECTOMY;  Surgeon: Osborne Oman, MD;  Location: [facility];  Service: Gynecology;  Laterality: Left;    Family History  Problem Relation Age of Onset   Hypertension Mother    Diabetes Mellitus II Mother    Other Mother        hx of hysterectomy for unspecified reason   Hypertension Sister    Hypertension Brother    Heart failure Maternal Grandmother    Heart Problems Maternal Grandmother    Hypertension Maternal Grandfather    Diabetes Maternal Grandfather    Colon cancer Paternal Uncle        dx. late 50s-early 60s   Gastric cancer Cousin 76   Colon cancer Cousin        dx. early-late 2s; (father also  had colon cancer)   Colon cancer Paternal Uncle        dx. late 50s-early 60s   Lung cancer Paternal Uncle        dx. 51s; smoker   Colon polyps Paternal Uncle        unknown number   Heart attack Maternal Aunt    Heart Problems Paternal Aunt    Heart attack Paternal Grandfather    Colon cancer Paternal Grandfather        dx. 65s-60s   Colon polyps Brother        1 maternal half-brother had approx 3 polyps   Breast cancer Cousin 25   Breast cancer Cousin        dx. early 13s or earlier (father had colon cancer)   Lung cancer Other        PGF's brothers (x4); dx. later in life; lim info   Esophageal cancer Neg Hx    Rectal cancer Neg Hx     Social History   Tobacco Use   Smoking status: Former    Packs/day: 1.00    Years: 16.00    Total pack years: 16.00    Types: Cigarettes    Quit date: 02/21/1998    Years since quitting: 23.9   Smokeless tobacco: Never   Tobacco comments:    0.5-1.0 ppd for 16 yrs  Vaping Use   Vaping Use: Never used  Substance Use Topics   Alcohol use: Not Currently   Drug use: Not Currently    Current  Outpatient Medications  Medication Sig Dispense Refill   fluticasone (FLONASE) 50 MCG/ACT nasal spray Place 1 spray into both nostrils daily. 16 g 2   HYDROcodone-acetaminophen (NORCO/VICODIN) 5-325 MG tablet Take 1 tablet by mouth every 6 (six) hours as needed for moderate pain. 60 tablet 0   lidocaine (LIDODERM) 5 % Place 1 patch onto the skin daily. Remove & Discard patch within 12 hours or as directed by MD 30 patch 0   lidocaine-prilocaine (EMLA) cream Apply 1 application. topically as needed. 30 g 6   Multiple Vitamins-Minerals (WOMENS MULTIVITAMIN PO) Take by mouth.     pantoprazole (PROTONIX) 40 MG tablet Take 1 tablet (40 mg total) by mouth daily. 90 tablet 4   promethazine (PHENERGAN) 25 MG tablet Take 1 tablet (25 mg total) by mouth every 6 (six) hours as needed. 90 tablet 0   gabapentin (NEURONTIN) 600 MG tablet Take 1 tablet (600 mg total) by mouth 2 (two) times daily. 180 tablet 7   methocarbamol (ROBAXIN) 500 MG tablet Take 1 tablet (500 mg total) by mouth 2 (two) times daily. (Patient not taking: Reported on 01/20/2022) 20 tablet 0   nitroGLYCERIN (NITROSTAT) 0.4 MG SL tablet Place 1 tablet (0.4 mg total) under the tongue every 5 (five) minutes as needed for chest pain. (Patient not taking: No sig reported) 25 tablet 2   No current facility-administered medications for this visit.   Facility-Administered Medications Ordered in Other Visits  Medication Dose Route Frequency Provider Last Rate Last Admin   sodium chloride flush (NS) 0.9 % injection 10 mL  10 mL Intravenous PRN Volanda Napoleon, MD   10 mL at 01/03/18 1316   sodium chloride flush (NS) 0.9 % injection 10 mL  10 mL Intravenous PRN Volanda Napoleon, MD   10 mL at 09/10/19 1114   sodium chloride flush (NS) 0.9 % injection 10 mL  10 mL Intravenous PRN Volanda Napoleon, MD   10 mL  at 09/17/21 1023    Allergies  Allergen Reactions   Tape Itching    Ok with adhesive and paper tape     Review of Systems:  neg      Physical Exam:    BP 98/76   Pulse 97   Ht _0  (1.549 m)   Wt 140 lb (63.5 kg)   LMP 02/28/2016 (Exact Date)   BMI 26.45 kg/m  Wt Readings from Last 3 Encounters:  01/20/22 140 lb (63.5 kg)  11/15/21 139 lb 6.4 oz (63.2 kg)  09/17/21 141 lb 15.6 oz (64.4 kg)   Constitutional: Had cachexia.  In no acute distress. Psychiatric: Normal mood and affect. Behavior is normal. HEENT: Pupils normal.  Conjunctivae are normal. No scleral icterus.  Cardiovascular: Normal rate, regular rhythm. No edema Pulmonary/chest: Effort normal and breath sounds normal. No wheezing, rales or rhonchi. Abdominal: Soft, nondistended. Nontender. Bowel sounds active throughout. There are no masses palpable. No hepatomegaly.  Well-healed surgical scars. Neurological: Alert and oriented to person place and time. Skin: Skin is warm and dry. No rashes noted.  Data Reviewed: I have personally reviewed following labs and imaging studies  CBC:    Latest Ref Rng & Units 09/17/2021    9:26 AM 07/09/2021   12:04 PM 05/07/2021   11:11 AM  CBC  WBC 4.0 - 10.5 K/uL 5.8  7.1  6.6   Hemoglobin 12.0 - 15.0 g/dL 10.4  11.0  10.0   Hematocrit 36.0 - 46.0 % 33.3  34.3  32.2   Platelets 150 - 400 K/uL 258  333  271     CMP:    Latest Ref Rng & Units 09/17/2021    9:26 AM 07/09/2021   12:04 PM 05/07/2021   11:11 AM  CMP  Glucose 70 - 99 mg/dL 104  111  120   BUN 6 - 20 mg/dL _1 Creatinine 0.44 - 1.00 mg/dL 1.03  1.03  0.93   Sodium 135 - 145 mmol/L 139  138  138   Potassium 3.5 - 5.1 mmol/L 4.3  4.7  4.1   Chloride 98 - 111 mmol/L 103  101  103   CO2 22 - 32 mmol/L 32  32  30   Calcium 8.9 - 10.3 mg/dL 9.1  9.2  9.0   Total Protein 6.5 - 8.1 g/dL 6.3  7.0  6.4   Total Bilirubin 0.3 - 1.2 mg/dL 0.3  0.3  0.4   Alkaline Phos 38 - 126 U/L 71  68  77   AST 15 - 41 U/L _2 ALT 0 - 44 U/L _3 Latest Ref Rng & Units 09/17/2021    9:26 AM 07/09/2021   12:04 PM 05/07/2021   11:11 AM   Hepatic Function  Total Protein 6.5 - 8.1 g/dL 6.3  7.0  6.4   Albumin 3.5 - 5.0 g/dL 3.8  4.0  3.7   AST 15 - 41 U/L _4 ALT 0 - 44 U/L _5 Alk Phosphatase 38 - 126 U/L 71  68  77   Total Bilirubin 0.3 - 1.2 mg/dL 0.3  0.3  0.4      Carmell Austria, MD 01/20/2022, 9:35 AM  Cc: Marcie Mowers, FNP

## 2022-01-20 NOTE — Patient Instructions (Addendum)
_______________________________________________________  If you are age 55 or older, your body mass index should be between 23-30. Your Body mass index is 26.45 kg/m. If this is out of the aforementioned range listed, please consider follow up with your Primary Care Provider.  If you are age 13 or younger, your body mass index should be between 19-25. Your Body mass index is 26.45 kg/m. If this is out of the aformentioned range listed, please consider follow up with your Primary Care Provider.   ________________________________________________________  The [facility] providers would like to encourage you to use [facility] to communicate with providers for non-urgent requests or questions.  Due to long hold times on the telephone, sending your provider a message by [facility] may be a faster and more efficient way to get a response.  Please allow 48 business hours for a response.  Please remember that this is for non-urgent requests.  _______________________________________________________  Paula [name] have been scheduled for an endoscopy. Please follow written instructions given to you at your visit today. If you use inhalers (even only as needed), please bring them with you on the day of your procedure.  Please gets labs from the cancer center. Lab order given.  We have sent the following medications to your pharmacy for you to pick up at your convenience: Phenergan '25mg'$  2 times a day   Thank you,  Dr. Jackquline Denmark

## 2022-01-21 ENCOUNTER — Other Ambulatory Visit: Payer: Self-pay | Admitting: *Deleted

## 2022-01-21 ENCOUNTER — Inpatient Hospital Stay: Payer: Medicare Other

## 2022-01-21 ENCOUNTER — Inpatient Hospital Stay: Payer: Medicare Other | Attending: Hematology & Oncology

## 2022-01-21 VITALS — BP 130/79 | HR 72 | Temp 98.6°F | Resp 18

## 2022-01-21 DIAGNOSIS — R1110 Vomiting, unspecified: Secondary | ICD-10-CM

## 2022-01-21 DIAGNOSIS — Z95828 Presence of other vascular implants and grafts: Secondary | ICD-10-CM

## 2022-01-21 DIAGNOSIS — R112 Nausea with vomiting, unspecified: Secondary | ICD-10-CM

## 2022-01-21 DIAGNOSIS — D500 Iron deficiency anemia secondary to blood loss (chronic): Secondary | ICD-10-CM

## 2022-01-21 DIAGNOSIS — D509 Iron deficiency anemia, unspecified: Secondary | ICD-10-CM | POA: Insufficient documentation

## 2022-01-21 DIAGNOSIS — C169 Malignant neoplasm of stomach, unspecified: Secondary | ICD-10-CM

## 2022-01-21 DIAGNOSIS — Z85028 Personal history of other malignant neoplasm of stomach: Secondary | ICD-10-CM | POA: Insufficient documentation

## 2022-01-21 LAB — CBC WITH DIFFERENTIAL (CANCER CENTER ONLY)
Abs Immature Granulocytes: 0.01 10*3/uL (ref 0.00–0.07)
Basophils Absolute: 0 10*3/uL (ref 0.0–0.1)
Basophils Relative: 1 %
Eosinophils Absolute: 0.2 10*3/uL (ref 0.0–0.5)
Eosinophils Relative: 4 %
HCT: 34.1 % — ABNORMAL LOW (ref 36.0–46.0)
Hemoglobin: 10.6 g/dL — ABNORMAL LOW (ref 12.0–15.0)
Immature Granulocytes: 0 %
Lymphocytes Relative: 47 %
Lymphs Abs: 2 10*3/uL (ref 0.7–4.0)
MCH: 28.2 pg (ref 26.0–34.0)
MCHC: 31.1 g/dL (ref 30.0–36.0)
MCV: 90.7 fL (ref 80.0–100.0)
Monocytes Absolute: 0.4 10*3/uL (ref 0.1–1.0)
Monocytes Relative: 10 %
Neutro Abs: 1.6 10*3/uL — ABNORMAL LOW (ref 1.7–7.7)
Neutrophils Relative %: 38 %
Platelet Count: 283 10*3/uL (ref 150–400)
RBC: 3.76 MIL/uL — ABNORMAL LOW (ref 3.87–5.11)
RDW: 13.6 % (ref 11.5–15.5)
WBC Count: 4.1 10*3/uL (ref 4.0–10.5)
nRBC: 0 % (ref 0.0–0.2)

## 2022-01-21 LAB — FERRITIN: Ferritin: 100 ng/mL (ref 11–307)

## 2022-01-21 LAB — CMP (CANCER CENTER ONLY)
ALT: 11 U/L (ref 0–44)
AST: 17 U/L (ref 15–41)
Albumin: 4.1 g/dL (ref 3.5–5.0)
Alkaline Phosphatase: 68 U/L (ref 38–126)
Anion gap: 6 (ref 5–15)
BUN: 15 mg/dL (ref 6–20)
CO2: 31 mmol/L (ref 22–32)
Calcium: 9.3 mg/dL (ref 8.9–10.3)
Chloride: 101 mmol/L (ref 98–111)
Creatinine: 0.95 mg/dL (ref 0.44–1.00)
GFR, Estimated: >60 mL/min (ref >60)
Glucose, Bld: 86 mg/dL (ref 70–99)
Potassium: 4.2 mmol/L (ref 3.5–5.1)
Sodium: 138 mmol/L (ref 135–145)
Total Bilirubin: 0.3 mg/dL (ref 0.3–1.2)
Total Protein: 7.3 g/dL (ref 6.5–8.1)

## 2022-01-21 LAB — IRON AND IRON BINDING CAPACITY (CC-WL,HP ONLY)
Iron: 53 ug/dL (ref 28–170)
Saturation Ratios: 19 % (ref 10.4–31.8)
TIBC: 273 ug/dL (ref 250–450)
UIBC: 220 ug/dL (ref 148–442)

## 2022-01-21 LAB — LACTATE DEHYDROGENASE: LDH: 135 U/L (ref 98–192)

## 2022-01-21 LAB — LIPASE, BLOOD: Lipase: 23 U/L (ref 11–51)

## 2022-01-21 MED ORDER — HEPARIN SOD (PORK) LOCK FLUSH 100 UNIT/ML IV SOLN
500.0000 [IU] | Freq: Once | INTRAVENOUS | Status: AC
  Administered 2022-01-21: 500 [IU] via INTRAVENOUS

## 2022-01-21 MED ORDER — SODIUM CHLORIDE 0.9% FLUSH
10.0000 mL | Freq: Once | INTRAVENOUS | Status: AC
  Administered 2022-01-21: 10 mL via INTRAVENOUS

## 2022-01-21 NOTE — Patient Instructions (Signed)

## 2022-01-28 ENCOUNTER — Ambulatory Visit (AMBULATORY_SURGERY_CENTER): Payer: Medicare Other | Admitting: Gastroenterology

## 2022-01-28 ENCOUNTER — Encounter: Payer: Self-pay | Admitting: Gastroenterology

## 2022-01-28 ENCOUNTER — Other Ambulatory Visit: Payer: Self-pay | Admitting: Hematology & Oncology

## 2022-01-28 VITALS — BP 121/71 | HR 77 | Temp 97.0°F | Resp 14 | Ht 61.0 in | Wt 140.0 lb

## 2022-01-28 DIAGNOSIS — K2970 Gastritis, unspecified, without bleeding: Secondary | ICD-10-CM

## 2022-01-28 DIAGNOSIS — R112 Nausea with vomiting, unspecified: Secondary | ICD-10-CM

## 2022-01-28 DIAGNOSIS — C163 Malignant neoplasm of pyloric antrum: Secondary | ICD-10-CM

## 2022-01-28 DIAGNOSIS — C169 Malignant neoplasm of stomach, unspecified: Secondary | ICD-10-CM

## 2022-01-28 DIAGNOSIS — K2270 Barrett's esophagus without dysplasia: Secondary | ICD-10-CM

## 2022-01-28 DIAGNOSIS — K2950 Unspecified chronic gastritis without bleeding: Secondary | ICD-10-CM

## 2022-01-28 MED ORDER — SODIUM CHLORIDE 0.9 % IV SOLN: 500.0000 mL | Freq: Once | INTRAVENOUS | Status: DC

## 2022-01-28 MED ORDER — SUCRALFATE 1 GM/10ML PO SUSP: 1.0000 g | Freq: Four times a day (QID) | ORAL | 0 refills | Status: DC

## 2022-01-28 NOTE — Progress Notes (Signed)
Chief Complaint: N/V  Referring Provider:  Marcie Mowers, FNP      ASSESSMENT AND PLAN;   #1. N/V   Prev neg CT AP 12/19/2018 for recurrence of gastric cancer.  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 Nl TSH. EGD Jan 2021 with 2 cm Barrett's esophagus, small gastric remnant with angular takeoff of afferent/efferent limbs. Refused revision (Dr Barry Dienes). Has stopped all marijuana and smoking.    #2. Stage IIA (T1a, N2, M0) gastric AdenoCa s/p BII gastrectomy/feeding J tube July 2015 followed by neoadjuvant chemoradiation (Dr Barry Dienes). Neg CT AP 12/19/2018. EGD 01/2018, 02/2019 - as below. Neg for recurrence.  #3. 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  #4. Chronic constipation.  Refuses screening colon.  Plan: -EGD -Phenegren 45m po BID to continue #60. Can use it PRN.  Sedation/fall precautions. -Continue protonix 414mPO QD #90, 4 refills -CBC, CMP, lipase, ferrintin, LDH, iron studies -I have encouraged her to eat small but more frequent meals 6-7 times/day.   -Minimize narcotics. -Take exlax everyday   HPI:    Paula [name] a [age].o. female  With H/O stage II gastric AdenoCa s/p Billroth II gastrectomy 2015 followed by neoadjuvant chemoradiation.  Has been having persistent abdominal pain, intermittent N/V ever since. Neg CT AP as below 11/2018. EGD 02/2019 showed small gastric remnant with moderate gastritis (neg Bx), angular takeoff of the efferent limb.  2 cm Barrett's esophagus without dysplasia.  Refused revision  Again N/V x 1 month, mostly in AM.  Then it gets better. She has stopped taking Protonix on her own.  Has been having heartburn as well. Due for repeat EGD for follow-up of Barrett's. Has stopped all mariuana/ smoking Phenergan does help.  No odynophagia or dysphagia.  No GI bleeding.  Phenergan helps with nausea/vomiting.  She takes it twice a day.  Overall has been eating better.  She could not tolerate  Ensure.  She is currently taking milkshakes 2/day.  Has gained weight as below.  Has constipation related to narcotics.  BMs 1/week which are hard to pass.  She does have associated abdominal bloating.  Has been taking Ex-Lax once per week.  Has recently quit smoking. No nonsteroidals.  No weight loss.  Wt Readings from Last 3 Encounters:  01/28/22 140 lb (63.5 kg)  01/20/22 140 lb (63.5 kg)  11/15/21 139 lb 6.4 oz (63.2 kg)     From previous notes:  Adm to [facility] with UGI bleed s/p EGD 12/09/2019 by Dr. ShKirby Crigleras negative except chronic findings.  She was managed conservatively.  CT Abdo/pelvis in addition to above findings did show pancreatic atrophy.  A trial of pancreatic enzymes was tried without any improvement.    Wt Readings from Last 3 Encounters:  01/28/22 140 lb (63.5 kg)  01/20/22 140 lb (63.5 kg)  11/15/21 139 lb 6.4 oz (63.2 kg)     Previous GI procedures:  Underwent CT Abdo/pelvis on 01/19/2021, 12/19/2018 which did not show any acute abnormalities.  No recurrence.  EGD 03/06/2019 - Moderate gastritis. (Likely etiology of recurrent anemia). - Likely 2 cm Barrett's esophagus (biopsied) - Patent Billroth II gastrojejunostomy with normal afferent/efferent limbs. Did have angular takeoff. -No recurrence of gastric cancer. -Bx: Eso: Barrett's esophagus without dysplasia. Gastric: Gastritis with negative HP.  EGD 02/07/2018 [facility] -Salmon-colored mucosa extending from 37 to 38 cm. -Billroth II gastrectomy.  Angular takeoff of both limbs. -Gastritis (Bx- neg) -Single 3 mm gastric polyp at anastomosis (Bx-hyperplastic polyp) -  Small amount of food residue in stomach.  Bilious gastric fluid. -Dilated lacteals in the duodenum (Bx- neg, neg for celiac) -No recurrence.  CTA chest/Abdo/pelvis 02/23/2019 1. No acute thoracic, abdominal or pelvic pathology. Specifically, no evidence for aortic dissection or aneurysm. 2. Chronic findings as above including Port-A-Cath,  postsurgical changes  Refused colonoscopy.  I talked to her today as well. Past Medical History:  Diagnosis Date   Anxiety    per pt   Arthritis    back   Barrett esophagus    Costochondritis    Esophageal reflux    On omeprazole   Family history of cancer    Headache(784.0) 2011   Migraines   Heart murmur    never caused any problems   History of hiatal hernia    History of radiation therapy 05/12/14- 06/17/14   post-op gastric region/ nodes/ 45 Gy in 25 fractions.    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) dx'd 08/2013   Tuberculosis    ? [id number]   Wears partial dentures    upper dentures    Past Surgical History:  Procedure Laterality Date   CESAREAN SECTION     x 2   ESOPHAGOGASTRODUODENOSCOPY N/A 09/16/2013   Procedure: ESOPHAGOGASTRODUODENOSCOPY (EGD);  Surgeon: Wonda Horner, MD;  Location: [facility];  Service: Endoscopy;  Laterality: N/A;   ESOPHAGOGASTRODUODENOSCOPY  12/09/2019   [facility]   GASTROSTOMY     r/t stomach cancer   IR CV LINE INJECTION  08/30/2017   IR FLUORO GUIDE PORT INSERTION RIGHT  03/21/2017   IR US GUIDE VASC ACCESS RIGHT  03/21/2017   LAPAROSCOPY N/A 09/20/2013   Procedure: DIAGNOSTIC LAPAROSCOPY,DISTAL GASTRECTOMY AND FEEDING GASTROJEJUNOSTOMY;  Surgeon: [name], MD;  Location: [facility];  Service: General;  Laterality: N/A;   OOPHORECTOMY     Around 1985 left ovary removal   PORTACATH PLACEMENT Left 11/18/2013   Procedure: INSERTION PORT-A-CATH;  Surgeon: [name], MD;  Location: [facility];  Service: General;  Laterality: Left;   TONSILLECTOMY     Around 1994   TUBAL LIGATION     UNILATERAL SALPINGECTOMY Left 11/09/2017   Procedure: UNILATERAL SALPINGECTOMY;  Surgeon: Osborne Oman, MD;   Location: [facility];  Service: Gynecology;  Laterality: Left;   UPPER GASTROINTESTINAL ENDOSCOPY     VAGINAL HYSTERECTOMY Left 11/09/2017   Procedure: HYSTERECTOMY VAGINAL WITH LEFT SALPINGECTOMY;  Surgeon: Osborne Oman, MD;  Location: [facility];  Service: Gynecology;  Laterality: Left;    Family History  Problem Relation Age of Onset   Hypertension Mother    Diabetes Mellitus II Mother    Other Mother        hx of hysterectomy for unspecified reason   Hypertension Sister    Hypertension Brother    Heart failure Maternal Grandmother    Heart Problems Maternal Grandmother    Hypertension Maternal Grandfather    Diabetes Maternal Grandfather    Colon cancer Paternal Uncle        dx. late 50s-early 60s   Gastric cancer Cousin 54   Colon cancer Cousin        dx. early-late 65s; (father also had colon cancer)   Colon cancer Paternal  Uncle        dx. late 50s-early 60s   Lung cancer Paternal Uncle        dx. 87s; smoker   Colon polyps Paternal Uncle        unknown number   Heart attack Maternal Aunt    Heart Problems Paternal Aunt    Heart attack Paternal Grandfather    Colon cancer Paternal Grandfather        dx. 60s-60s   Colon polyps Brother        1 maternal half-brother had approx 3 polyps   Breast cancer Cousin 66   Breast cancer Cousin        dx. early 17s or earlier (father had colon cancer)   Lung cancer Other        PGF's brothers (x4); dx. later in life; lim info   Esophageal cancer Neg Hx    Rectal cancer Neg Hx     Social History   Tobacco Use   Smoking status: Former    Packs/day: 1.00    Years: 16.00    Total pack years: 16.00    Types: Cigarettes    Quit date: 02/21/1998    Years since quitting: 23.9   Smokeless tobacco: Never   Tobacco comments:    0.5-1.0 ppd for 16 yrs  Vaping Use   Vaping Use: Never used  Substance Use Topics   Alcohol use: Not Currently   Drug use: Not Currently    Current Outpatient Medications  Medication Sig Dispense  Refill   fluticasone (FLONASE) 50 MCG/ACT nasal spray Place 1 spray into both nostrils daily. 16 g 2   gabapentin (NEURONTIN) 600 MG tablet Take 1 tablet (600 mg total) by mouth 2 (two) times daily. 180 tablet 7   HYDROcodone-acetaminophen (NORCO/VICODIN) 5-325 MG tablet Take 1 tablet by mouth every 6 (six) hours as needed for moderate pain. 60 tablet 0   lidocaine (LIDODERM) 5 % Place 1 patch onto the skin daily. Remove & Discard patch within 12 hours or as directed by MD 30 patch 0   lidocaine-prilocaine (EMLA) cream Apply 1 application. topically as needed. 30 g 6   methocarbamol (ROBAXIN) 500 MG tablet Take 1 tablet (500 mg total) by mouth 2 (two) times daily. (Patient not taking: Reported on 01/20/2022) 20 tablet 0   Multiple Vitamins-Minerals (WOMENS MULTIVITAMIN PO) Take by mouth.     nitroGLYCERIN (NITROSTAT) 0.4 MG SL tablet Place 1 tablet (0.4 mg total) under the tongue every 5 (five) minutes as needed for chest pain. (Patient not taking: No sig reported) 25 tablet 2   pantoprazole (PROTONIX) 40 MG tablet Take 1 tablet (40 mg total) by mouth daily. 90 tablet 4   promethazine (PHENERGAN) 25 MG tablet Take 1 tablet (25 mg total) by mouth 2 (two) times daily. 60 tablet 3   Current Facility-Administered Medications  Medication Dose Route Frequency Provider Last Rate Last Admin   0.9 %  sodium chloride infusion  500 mL Intravenous Once Jackquline Denmark, MD       Facility-Administered Medications Ordered in Other Visits  Medication Dose Route Frequency Provider Last Rate Last Admin   sodium chloride flush (NS) 0.9 % injection 10 mL  10 mL Intravenous PRN Volanda Napoleon, MD   10 mL at 01/03/18 1316   sodium chloride flush (NS) 0.9 % injection 10 mL  10 mL Intravenous PRN Volanda Napoleon, MD   10 mL at 09/10/19 1114   sodium chloride flush (NS) 0.9 %  injection 10 mL  10 mL Intravenous PRN Volanda Napoleon, MD   10 mL at 09/17/21 1023    Allergies  Allergen Reactions   Tape Itching    Ok  with adhesive and paper tape     Review of Systems:  neg     Physical Exam:    BP 127/76   Pulse 86   Temp (!) 97 F (36.1 C) (Temporal)   Ht _0  (1.549 m)   Wt 140 lb (63.5 kg)   LMP 02/28/2016 (Exact Date)   SpO2 100%   BMI 26.45 kg/m  Wt Readings from Last 3 Encounters:  01/28/22 140 lb (63.5 kg)  01/20/22 140 lb (63.5 kg)  11/15/21 139 lb 6.4 oz (63.2 kg)   Constitutional: Had cachexia.  In no acute distress. Psychiatric: Normal mood and affect. Behavior is normal. HEENT: Pupils normal.  Conjunctivae are normal. No scleral icterus.  Cardiovascular: Normal rate, regular rhythm. No edema Pulmonary/chest: Effort normal and breath sounds normal. No wheezing, rales or rhonchi. Abdominal: Soft, nondistended. Nontender. Bowel sounds active throughout. There are no masses palpable. No hepatomegaly.  Well-healed surgical scars. Neurological: Alert and oriented to person place and time. Skin: Skin is warm and dry. No rashes noted.  Data Reviewed: I have personally reviewed following labs and imaging studies  CBC:    Latest Ref Rng & Units 01/21/2022   11:25 AM 09/17/2021    9:26 AM 07/09/2021   12:04 PM  CBC  WBC 4.0 - 10.5 K/uL 4.1  5.8  7.1   Hemoglobin 12.0 - 15.0 g/dL 10.6  10.4  11.0   Hematocrit 36.0 - 46.0 % 34.1  33.3  34.3   Platelets 150 - 400 K/uL 283  258  333     CMP:    Latest Ref Rng & Units 01/21/2022   11:25 AM 09/17/2021    9:26 AM 07/09/2021   12:04 PM  CMP  Glucose 70 - 99 mg/dL 86  104  111   BUN 6 - 20 mg/dL _1 Creatinine 0.44 - 1.00 mg/dL 0.95  1.03  1.03   Sodium 135 - 145 mmol/L 138  139  138   Potassium 3.5 - 5.1 mmol/L 4.2  4.3  4.7   Chloride 98 - 111 mmol/L 101  103  101   CO2 22 - 32 mmol/L 31  32  32   Calcium 8.9 - 10.3 mg/dL 9.3  9.1  9.2   Total Protein 6.5 - 8.1 g/dL 7.3  6.3  7.0   Total Bilirubin 0.3 - 1.2 mg/dL 0.3  0.3  0.3   Alkaline Phos 38 - 126 U/L 68  71  68   AST 15 - 41 U/L _2 ALT 0 - 44 U/L _3 Latest Ref Rng & Units 01/21/2022   11:25 AM 09/17/2021    9:26 AM 07/09/2021   12:04 PM  Hepatic Function  Total Protein 6.5 - 8.1 g/dL 7.3  6.3  7.0   Albumin 3.5 - 5.0 g/dL 4.1  3.8  4.0   AST 15 - 41 U/L _4 ALT 0 - 44 U/L _5 Alk Phosphatase 38 - 126 U/L 68  71  68   Total Bilirubin 0.3 - 1.2 mg/dL 0.3  0.3  0.3  Carmell Austria, MD 01/28/2022, 9:36 AM  Cc: Marcie Mowers, FNP

## 2022-01-28 NOTE — Patient Instructions (Addendum)
- Patient has a contact number available for emergencies. The signs and symptoms of potential delayed complications were discussed with the patient. Return to normal activities tomorrow. Written discharge instructions were provided to the patient. - Resume previous diet. - Protonix 40 mg p.o. once a day. - Add Carafate elixir 1 g p.o. 4 times daily x 2 weeks. - If continued problems, obtain upper GI series followed by [facility]. If still with problems,may consider surgical evaluation for revision (conversion to Roux-en-Y). - I do believe that she would be dependent upon IV iron and may require intermittent blood transfusions. - Continue present medications. - Minimize marijuana - Await pathology results. - No aspirin, ibuprofen, naproxen, or other non-steroidal anti-inflammatory drugs. - Return to GI clinic in 6 weeks. -Handout on gastritis provided   YOU HAD AN ENDOSCOPIC PROCEDURE TODAY AT [facility]:   Refer to the procedure report that was given to you for any specific questions about what was found during the examination.  If the procedure report does not answer your questions, please call your gastroenterologist to clarify.  If you requested that your care partner not be given the details of your procedure findings, then the procedure report has been included in a sealed envelope for you to review at your convenience later.  YOU SHOULD EXPECT: Some feelings of bloating in the abdomen. Passage of more gas than usual.  Walking can help get rid of the air that was put into your GI tract during the procedure and reduce the bloating. If you had a lower endoscopy (such as a colonoscopy or flexible sigmoidoscopy) you may notice spotting of blood in your stool or on the toilet paper. If you underwent a bowel prep for your procedure, you may not have a normal bowel movement for a few days.  Please Note:  You might notice some irritation and congestion in your nose or some drainage.  This is  from the oxygen used during your procedure.  There is no need for concern and it should clear up in a day or so.  SYMPTOMS TO REPORT IMMEDIATELY:  Following upper endoscopy (EGD)  Vomiting of blood or coffee ground material  New chest pain or pain under the shoulder blades  Painful or persistently difficult swallowing  New shortness of breath  Fever of 100F or higher  Black, tarry-looking stools  For urgent or emergent issues, a gastroenterologist can be reached at any hour by calling [phone]. Do not use MyChart messaging for urgent concerns.    DIET:  We do recommend a small meal at first, but then you may proceed to your regular diet.  Drink plenty of fluids but you should avoid alcoholic beverages for 24 hours.  ACTIVITY:  You should plan to take it easy for the rest of today and you should NOT DRIVE or use heavy machinery until tomorrow (because of the sedation medicines used during the test).    FOLLOW UP: Our staff will call the number listed on your records the next business day following your procedure.  We will call around 7:15- 8:00 am to check on you and address any questions or concerns that you may have regarding the information given to you following your procedure. If we do not reach you, we will leave a message.     If any biopsies were taken you will be contacted by phone or by letter within the next 1-3 weeks.  Please call us at [phone] if you have not heard  about the biopsies in 3 weeks.    SIGNATURES/CONFIDENTIALITY: You and/or your care partner have signed paperwork which will be entered into your electronic medical record.  These signatures attest to the fact that that the information above on your After Visit Summary has been reviewed and is understood.  Full responsibility of the confidentiality of this discharge information lies with you and/or your care-partner.

## 2022-01-28 NOTE — Op Note (Signed)
[place] Patient Name: Paula [name] Procedure Date: 01/28/2022 9:51 AM MRN: [id number] Endoscopist: Jackquline Denmark , MD, [id number] Age: 56 Referring MD:  Date of Birth: Feb 05, 1966 Gender: Female Account #: [id number] Procedure:                Upper GI endoscopy Indications:              1. N/V. 2. H/O Stage IIA (T1a, N2, M0) gastric                            AdenoCa s/p BII gastrectomy/feeding J tube July                            2015 followed by neoadjuvant chemoradiation (Dr                            Barry Dienes). Neg CT for recurrence. Medicines:                Monitored Anesthesia Care Procedure:                Pre-Anesthesia Assessment:                           - Prior to the procedure, a History and Physical                            was performed, and patient medications and                            allergies were reviewed. The patient's tolerance of                            previous anesthesia was also reviewed. The risks                            and benefits of the procedure and the sedation                            options and risks were discussed with the patient.                            All questions were answered, and informed consent                            was obtained. Prior Anticoagulants: The patient has                            taken no anticoagulant or antiplatelet agents. ASA                            Grade Assessment: III - A patient with severe                            systemic disease. After reviewing the risks and  benefits, the patient was deemed in satisfactory                            condition to undergo the procedure.                           After obtaining informed consent, the endoscope was                            passed under direct vision. Throughout the                            procedure, the patient's blood pressure, pulse, and                            oxygen saturations  were monitored continuously. The                            Endoscope was introduced through the mouth, and                            advanced to the third part of duodenum. The upper                            GI endoscopy was accomplished without difficulty.                            The patient tolerated the procedure well. Scope In: Scope Out: Findings:                 There were esophageal mucosal changes consistent                            with short-segment Barrett's esophagus present in                            the lower third of the esophagus extending from 36                            up to 38 cm, examined by NBI. Mucosa was biopsied                            with a cold forceps for histology.                           Evidence of a patent Billroth II gastrojejunostomy                            was found. The gastrojejunal anastomosis and                            gastric remnant was characterized by edema, erosion  and erythema. Biopsies were taken with a cold                            forceps for histology.                           A medium amount of food (residue) was found in the                            gastric body did limit exam.                           Both limbs were intubated. There was angular                            takeoff of efferent loop. These were normal. Complications:            No immediate complications. Estimated Blood Loss:     Estimated blood loss: none. Impression:               - Esophageal mucosal changes consistent with                            short-segment Barrett's esophagus. Biopsied.                           - Patent Billroth II gastrojejunostomy was found,                            characterized by erosion, erythema and edema.                            Biopsied.                           - A medium amount of food (residue) in the stomach                            limiting examination. No  mechanical outlet                            obstruction. But does appear to have partial                            functional outlet obstruction.                           - Normal examined jejunum. Note angular takeoff of                            afferent limb. Recommendation:           - Patient has a contact number available for                            emergencies. The signs and symptoms of potential  delayed complications were discussed with the                            patient. Return to normal activities tomorrow.                            Written discharge instructions were provided to the                            patient.                           - Resume previous diet.                           - Continue present medications including Phenergan                            as needed.                           - Increase Protonix 40 mg p.o. twice daily #60, 6 RF                           - No aspirin, ibuprofen, naproxen, or other                            non-steroidal anti-inflammatory drugs.                           - Await pathology results.                           - Small but more frequent meals. Chew foods                            especially meats and breads well and slowly. Please                            walk for at least 30 minutes after meals, if                            possible.                           - Continue IV iron.                           - The findings and recommendations were discussed                            with the patient's family. Further workup, if still                            with problems. Jackquline Denmark, MD 01/28/2022 10:15:48 AM This report has been signed electronically.

## 2022-01-28 NOTE — Progress Notes (Signed)
Pt's states no medical or surgical changes since previsit or office visit.  Patient had root canal on Tuesday - still a little swollen.

## 2022-01-28 NOTE — Progress Notes (Signed)
A and O x3. Report to RN. Tolerated MAC anesthesia well.Teeth unchanged after procedure. 

## 2022-01-28 NOTE — Progress Notes (Signed)
Called to room to assist during endoscopic procedure.  Patient ID and intended procedure confirmed with present staff. Received instructions for my participation in the procedure from the performing physician.  

## 2022-01-31 ENCOUNTER — Telehealth: Payer: Self-pay | Admitting: *Deleted

## 2022-01-31 MED ORDER — HYDROCODONE-ACETAMINOPHEN 5-325 MG PO TABS: 1.0000 | ORAL_TABLET | Freq: Four times a day (QID) | ORAL | 0 refills | Status: DC | PRN

## 2022-01-31 NOTE — Telephone Encounter (Signed)
Last refilled 12/29/21 360 with 0 refills

## 2022-01-31 NOTE — Telephone Encounter (Signed)
  Follow up Call-     01/28/2022    9:32 AM  Call back number  Post procedure Call Back phone  # [phone]  Permission to leave phone message Yes     Patient questions:  Do you have a fever, pain , or abdominal swelling? No. Pain Score  0 *  Have you tolerated food without any problems? Yes.    Have you been able to return to your normal activities? Yes.    Do you have any questions about your discharge instructions: Diet   No. Medications  No. Follow up visit  No.  Do you have questions or concerns about your Care? No.  Actions: * If pain score is 4 or above: No action needed, pain <4.

## 2022-01-31 NOTE — Telephone Encounter (Signed)
Entered in error

## 2022-02-02 ENCOUNTER — Other Ambulatory Visit (HOSPITAL_BASED_OUTPATIENT_CLINIC_OR_DEPARTMENT_OTHER): Payer: Self-pay

## 2022-02-02 ENCOUNTER — Other Ambulatory Visit: Payer: Self-pay

## 2022-02-02 DIAGNOSIS — K2270 Barrett's esophagus without dysplasia: Secondary | ICD-10-CM

## 2022-02-02 MED ORDER — PANTOPRAZOLE SODIUM 40 MG PO TBEC
40.0000 mg | DELAYED_RELEASE_TABLET | Freq: Two times a day (BID) | ORAL | 6 refills | Status: DC
  Filled 2022-02-02 – 2022-07-29 (×2): qty 60, 30d supply, fill #0
  Filled 2022-10-27 – 2022-11-21 (×2): qty 60, 30d supply, fill #1
  Filled 2023-01-05: qty 60, 30d supply, fill #2

## 2022-02-06 ENCOUNTER — Encounter: Payer: Self-pay | Admitting: Gastroenterology

## 2022-03-03 ENCOUNTER — Other Ambulatory Visit: Payer: Self-pay | Admitting: Hematology & Oncology

## 2022-03-03 DIAGNOSIS — C169 Malignant neoplasm of stomach, unspecified: Secondary | ICD-10-CM

## 2022-03-03 DIAGNOSIS — C163 Malignant neoplasm of pyloric antrum: Secondary | ICD-10-CM

## 2022-03-07 ENCOUNTER — Encounter: Payer: Self-pay | Admitting: Family

## 2022-03-07 ENCOUNTER — Other Ambulatory Visit: Payer: Self-pay

## 2022-03-07 ENCOUNTER — Other Ambulatory Visit (HOSPITAL_BASED_OUTPATIENT_CLINIC_OR_DEPARTMENT_OTHER): Payer: Self-pay

## 2022-03-07 DIAGNOSIS — C163 Malignant neoplasm of pyloric antrum: Secondary | ICD-10-CM

## 2022-03-07 DIAGNOSIS — C169 Malignant neoplasm of stomach, unspecified: Secondary | ICD-10-CM

## 2022-03-07 MED ORDER — HYDROCODONE-ACETAMINOPHEN 5-325 MG PO TABS
1.0000 | ORAL_TABLET | Freq: Four times a day (QID) | ORAL | 0 refills | Status: DC | PRN
  Filled 2022-03-07: qty 60, 15d supply, fill #0

## 2022-03-14 ENCOUNTER — Encounter: Payer: Self-pay | Admitting: Family

## 2022-03-18 ENCOUNTER — Other Ambulatory Visit: Payer: Self-pay | Admitting: Family

## 2022-03-18 DIAGNOSIS — D500 Iron deficiency anemia secondary to blood loss (chronic): Secondary | ICD-10-CM

## 2022-03-21 ENCOUNTER — Inpatient Hospital Stay: Payer: 59 | Attending: Hematology & Oncology

## 2022-03-21 ENCOUNTER — Inpatient Hospital Stay: Payer: 59

## 2022-03-21 ENCOUNTER — Inpatient Hospital Stay: Payer: 59 | Admitting: Family

## 2022-04-04 ENCOUNTER — Other Ambulatory Visit: Payer: Self-pay | Admitting: *Deleted

## 2022-04-04 DIAGNOSIS — C163 Malignant neoplasm of pyloric antrum: Secondary | ICD-10-CM

## 2022-04-04 DIAGNOSIS — C169 Malignant neoplasm of stomach, unspecified: Secondary | ICD-10-CM

## 2022-04-04 MED ORDER — HYDROCODONE-ACETAMINOPHEN 5-325 MG PO TABS: 1.0000 | ORAL_TABLET | Freq: Four times a day (QID) | ORAL | 0 refills | Status: DC | PRN

## 2022-04-19 ENCOUNTER — Inpatient Hospital Stay: Payer: 59

## 2022-04-19 ENCOUNTER — Inpatient Hospital Stay: Payer: 59 | Admitting: Family

## 2022-04-20 ENCOUNTER — Inpatient Hospital Stay: Payer: 59 | Admitting: Licensed Clinical Social Worker

## 2022-04-20 ENCOUNTER — Encounter: Payer: Self-pay | Admitting: Family

## 2022-04-20 ENCOUNTER — Inpatient Hospital Stay (HOSPITAL_BASED_OUTPATIENT_CLINIC_OR_DEPARTMENT_OTHER): Payer: 59 | Admitting: Family

## 2022-04-20 ENCOUNTER — Inpatient Hospital Stay: Payer: 59 | Attending: Hematology & Oncology

## 2022-04-20 ENCOUNTER — Inpatient Hospital Stay: Payer: 59

## 2022-04-20 VITALS — BP 111/74 | HR 92 | Temp 98.7°F | Resp 18

## 2022-04-20 DIAGNOSIS — Z85028 Personal history of other malignant neoplasm of stomach: Secondary | ICD-10-CM | POA: Insufficient documentation

## 2022-04-20 DIAGNOSIS — C169 Malignant neoplasm of stomach, unspecified: Secondary | ICD-10-CM | POA: Diagnosis not present

## 2022-04-20 DIAGNOSIS — D500 Iron deficiency anemia secondary to blood loss (chronic): Secondary | ICD-10-CM

## 2022-04-20 DIAGNOSIS — Z903 Acquired absence of stomach [part of]: Secondary | ICD-10-CM | POA: Diagnosis not present

## 2022-04-20 DIAGNOSIS — Z95828 Presence of other vascular implants and grafts: Secondary | ICD-10-CM | POA: Diagnosis not present

## 2022-04-20 DIAGNOSIS — D509 Iron deficiency anemia, unspecified: Secondary | ICD-10-CM | POA: Diagnosis present

## 2022-04-20 DIAGNOSIS — Z923 Personal history of irradiation: Secondary | ICD-10-CM | POA: Diagnosis not present

## 2022-04-20 LAB — CBC WITH DIFFERENTIAL (CANCER CENTER ONLY)
Abs Immature Granulocytes: 0.01 10*3/uL (ref 0.00–0.07)
Basophils Absolute: 0 10*3/uL (ref 0.0–0.1)
Basophils Relative: 1 %
Eosinophils Absolute: 0.2 10*3/uL (ref 0.0–0.5)
Eosinophils Relative: 4 %
HCT: 35.6 % — ABNORMAL LOW (ref 36.0–46.0)
Hemoglobin: 11.1 g/dL — ABNORMAL LOW (ref 12.0–15.0)
Immature Granulocytes: 0 %
Lymphocytes Relative: 47 %
Lymphs Abs: 2 10*3/uL (ref 0.7–4.0)
MCH: 27.9 pg (ref 26.0–34.0)
MCHC: 31.2 g/dL (ref 30.0–36.0)
MCV: 89.4 fL (ref 80.0–100.0)
Monocytes Absolute: 0.4 10*3/uL (ref 0.1–1.0)
Monocytes Relative: 10 %
Neutro Abs: 1.6 10*3/uL — ABNORMAL LOW (ref 1.7–7.7)
Neutrophils Relative %: 38 %
Platelet Count: 233 10*3/uL (ref 150–400)
RBC: 3.98 MIL/uL (ref 3.87–5.11)
RDW: 13.8 % (ref 11.5–15.5)
WBC Count: 4.3 10*3/uL (ref 4.0–10.5)
nRBC: 0 % (ref 0.0–0.2)

## 2022-04-20 LAB — LACTATE DEHYDROGENASE: LDH: 128 U/L (ref 98–192)

## 2022-04-20 LAB — CMP (CANCER CENTER ONLY)
ALT: 11 U/L (ref 0–44)
AST: 18 U/L (ref 15–41)
Albumin: 4.2 g/dL (ref 3.5–5.0)
Alkaline Phosphatase: 68 U/L (ref 38–126)
Anion gap: 6 (ref 5–15)
BUN: 10 mg/dL (ref 6–20)
CO2: 30 mmol/L (ref 22–32)
Calcium: 9.5 mg/dL (ref 8.9–10.3)
Chloride: 102 mmol/L (ref 98–111)
Creatinine: 1.18 mg/dL — ABNORMAL HIGH (ref 0.44–1.00)
GFR, Estimated: 55 mL/min — ABNORMAL LOW (ref >60)
Glucose, Bld: 104 mg/dL — ABNORMAL HIGH (ref 70–99)
Potassium: 4.5 mmol/L (ref 3.5–5.1)
Sodium: 138 mmol/L (ref 135–145)
Total Bilirubin: 0.4 mg/dL (ref 0.3–1.2)
Total Protein: 7 g/dL (ref 6.5–8.1)

## 2022-04-20 LAB — RETICULOCYTES
Immature Retic Fract: 10.6 % (ref 2.3–15.9)
RBC.: 3.99 MIL/uL (ref 3.87–5.11)
Retic Count, Absolute: 49.1 10*3/uL (ref 19.0–186.0)
Retic Ct Pct: 1.2 % (ref 0.4–3.1)

## 2022-04-20 LAB — FERRITIN: Ferritin: 86 ng/mL (ref 11–307)

## 2022-04-20 MED ORDER — SODIUM CHLORIDE 0.9% FLUSH
10.0000 mL | Freq: Once | INTRAVENOUS | Status: AC
  Administered 2022-04-20: 10 mL via INTRAVENOUS

## 2022-04-20 MED ORDER — HEPARIN SOD (PORK) LOCK FLUSH 100 UNIT/ML IV SOLN
500.0000 [IU] | Freq: Once | INTRAVENOUS | Status: AC
  Administered 2022-04-20: 500 [IU] via INTRAVENOUS

## 2022-04-20 NOTE — Patient Instructions (Signed)

## 2022-04-20 NOTE — Progress Notes (Signed)
[place] Work  Clinical Social Work was referred by nurse for assessment of psychosocial needs.  Clinical Social Worker met with patient to offer support and assess for needs.    She stated to LPN that she had fleeting suicidal ideations but would never harm herself or others.  Other than not sleeping well, she did not express clinical depressive symptoms.   She recently placed her mother in a facility, and her family is being very negative about the decision.  Her relationship with her mother and siblings sounds complicated.  [place] declined a therapist referral.  She stated she has been in therapy twice in the past.  "I'll be alright."  Provided supportive listening and supportive counseling.  Informed Lottie Dawson, NP, of outcome of visit.  Judson Roch is familiar with patient's situation.     Margaree Mackintosh, LCSW  Clinical Social Worker [place]        Patient is participating in a Managed Medicaid Plan:  Yes

## 2022-04-20 NOTE — Progress Notes (Signed)
Hematology and Oncology Follow Up Visit  Paula [name] [id number] [birth year] 57 y.o. 04/20/2022   Principle Diagnosis:  Stage IIA (T1a, N2, M0) gastric adenocarcinoma diagnosed in July of 2015 Intermittent iron deficiency anemia   Past Therapy: Status post diagnostic laparoscopy, distal gastrectomy with billroth II anastamosis and feeding gastrojejunostomy tube under the care of Dr. Barry Dienes on 09/20/2013 Adjuvant systemic chemotherapy with 5-FU 600 mg/M2 with leucovorin on days 1, 8, 15, 22, 29 and 36 every 8 weeks Adjuvant concurrent chemoradiation with Xeloda 650 mg/M2 by mouth twice a day for 5 days every week with radiation. The patient completed the course of radiotherapy but she declined to take Xeloda during her radiation   Current Therapy:        Observation IV iron as indicated   Interim History:  [name] is here today for follow-up. She is under a lot of stress at home. She recently had to put her mother in an assisted living facility. This has caused some anxiety and depression. She was able to meet with out social worker Jeani Hawking and discuss options for help.  She denies thoughts of harming herself or others at this time.  She is feeling fatigued and has had trouble sleeping. She plans to discuss trying a sleep aid with her PCP. No issues with blood loss, bruising or petechiae.  No fever, chills, n/v, cough, rash, dizziness, SOB, chest pain, palpitations, abdominal pain or changes in bowel or bladder habits.  No swelling, tenderness, numbness or tingling in her extremities at this time.  No falls or syncope reported.  She feels that her appetite is improved. She is doing her best to stay well hydrated.  ECOG Performance Status: 1 - Symptomatic but completely ambulatory  Medications:  Allergies as of 04/20/2022       Reactions   Tape Itching   Ok with adhesive and paper tape         Medication List        Accurate as of April 20, 2022  2:07 PM. If  you have any questions, ask your nurse or doctor.          amoxicillin 500 MG tablet Commonly known as: AMOXIL Take 500 mg by mouth 3 (three) times daily.   clindamycin 300 MG capsule Commonly known as: CLEOCIN Take 300 mg by mouth 3 (three) times daily.   fluticasone 50 MCG/ACT nasal spray Commonly known as: FLONASE Place 1 spray into both nostrils daily.   gabapentin 600 MG tablet Commonly known as: NEURONTIN Take 1 tablet (600 mg total) by mouth 2 (two) times daily.   HYDROcodone-acetaminophen 5-325 MG tablet Commonly known as: NORCO/VICODIN Take 1 tablet by mouth every 6 (six) hours as needed for moderate pain.   lidocaine 5 % Commonly known as: Lidoderm Place 1 patch onto the skin daily. Remove & Discard patch within 12 hours or as directed by MD   lidocaine-prilocaine cream Commonly known as: EMLA Apply 1 application. topically as needed.   methocarbamol 500 MG tablet Commonly known as: ROBAXIN Take 1 tablet (500 mg total) by mouth 2 (two) times daily.   nitroGLYCERIN 0.4 MG SL tablet Commonly known as: NITROSTAT Place 1 tablet (0.4 mg total) under the tongue every 5 (five) minutes as needed for chest pain.   pantoprazole 40 MG tablet Commonly known as: PROTONIX Take 1 tablet (40 mg total) by mouth 2 (two) times daily.   promethazine 25 MG tablet Commonly known as: PHENERGAN Take 1 tablet (25 mg  total) by mouth 2 (two) times daily.   sucralfate 1 GM/10ML suspension Commonly known as: CARAFATE Take 10 mLs (1 g total) by mouth 4 (four) times daily. Carafate elixir 1 g po 4 times daily x 2 weeks.   WOMENS MULTIVITAMIN PO Take by mouth.        Allergies:  Allergies  Allergen Reactions   Tape Itching    Ok with adhesive and paper tape     Past Medical History, Surgical history, Social history, and Family History were reviewed and updated.  Review of Systems: All other 10 point review of systems is negative.   Physical Exam:  oral temperature  is 98.7 F (37.1 C). Her blood pressure is 111/74 and her pulse is 92. Her respiration is 18 and oxygen saturation is 100%.   Wt Readings from Last 3 Encounters:  01/28/22 140 lb (63.5 kg)  01/20/22 140 lb (63.5 kg)  11/15/21 139 lb 6.4 oz (63.2 kg)    Ocular: Sclerae unicteric, pupils equal, round and reactive to light Ear-nose-throat: Oropharynx clear, dentition fair Lymphatic: No cervical or supraclavicular adenopathy Lungs no rales or rhonchi, good excursion bilaterally Heart regular rate and rhythm, no murmur appreciated Abd soft, nontender, positive bowel sounds MSK no focal spinal tenderness, no joint edema Neuro: non-focal, well-oriented, appropriate affect Breasts: Deferred   Lab Results  Component Value Date   WBC 4.3 04/20/2022   HGB 11.1 (L) 04/20/2022   HCT 35.6 (L) 04/20/2022   MCV 89.4 04/20/2022   PLT 233 04/20/2022   Lab Results  Component Value Date   FERRITIN 100 01/21/2022   IRON 53 01/21/2022   TIBC 273 01/21/2022   UIBC 220 01/21/2022   IRONPCTSAT 19 01/21/2022   Lab Results  Component Value Date   RETICCTPCT 1.2 04/20/2022   RBC 3.98 04/20/2022   RBC 3.99 04/20/2022   No results found for: "KPAFRELGTCHN", "LAMBDASER", "KAPLAMBRATIO" Lab Results  Component Value Date   IGGSERUM 1,273 05/21/2015   IGMSERUM 40 05/21/2015   No results found for: "TOTALPROTELP", "ALBUMINELP", "A1GS", "A2GS", "BETS", "BETA2SER", "GAMS", "MSPIKE", "SPEI"   Chemistry      Component Value Date/Time   NA 138 04/20/2022 1255   NA 140 12/30/2020 1506   NA 140 01/26/2017 1002   NA 140 09/24/2015 0953   K 4.5 04/20/2022 1255   K 3.5 01/26/2017 1002   K 4.0 09/24/2015 0953   CL 102 04/20/2022 1255   CL 106 01/26/2017 1002   CO2 30 04/20/2022 1255   CO2 26 01/26/2017 1002   CO2 23 09/24/2015 0953   BUN 10 04/20/2022 1255   BUN 14 12/30/2020 1506   BUN 6 (L) 01/26/2017 1002   BUN 8.6 09/24/2015 0953   CREATININE 1.18 (H) 04/20/2022 1255   CREATININE 1.1  01/26/2017 1002   CREATININE 1.0 09/24/2015 0953      Component Value Date/Time   CALCIUM 9.5 04/20/2022 1255   CALCIUM 8.9 01/26/2017 1002   CALCIUM 9.6 09/24/2015 0953   ALKPHOS 68 04/20/2022 1255   ALKPHOS 68 01/26/2017 1002   ALKPHOS 99 09/24/2015 0953   AST 18 04/20/2022 1255   AST 17 09/24/2015 0953   ALT 11 04/20/2022 1255   ALT 17 01/26/2017 1002   ALT 11 09/24/2015 0953   BILITOT 0.4 04/20/2022 1255   BILITOT 0.83 09/24/2015 0953       Impression and Plan: [name] is a pleasant 57 yo African American female with history of stage II gastric cancer and partial gastrectomy  followed by adjuvant chemo and radiation. She states that she will follow-up with Jeani Hawking and has her contact information.  Iron studies are pending. We will replace if needed.  Port flush every 2 month, follow-up in 6 months.   Lottie Dawson, NP 2/28/20242:07 PM

## 2022-04-21 LAB — IRON AND IRON BINDING CAPACITY (CC-WL,HP ONLY)
Iron: 68 ug/dL (ref 28–170)
Saturation Ratios: 26 % (ref 10.4–31.8)
TIBC: 259 ug/dL (ref 250–450)
UIBC: 191 ug/dL (ref 148–442)

## 2022-04-26 ENCOUNTER — Telehealth: Payer: Self-pay | Admitting: Gastroenterology

## 2022-04-26 ENCOUNTER — Other Ambulatory Visit: Payer: Self-pay

## 2022-04-26 DIAGNOSIS — T451X5A Adverse effect of antineoplastic and immunosuppressive drugs, initial encounter: Secondary | ICD-10-CM

## 2022-04-26 MED ORDER — PROMETHAZINE HCL 25 MG PO TABS: 25.0000 mg | ORAL_TABLET | Freq: Two times a day (BID) | ORAL | 3 refills | Status: DC

## 2022-04-26 NOTE — Telephone Encounter (Signed)
Inbound call from patient states she has been throwing up since last night, is requesting refill for promethazine (PHENERGAN  sent to [facility].

## 2022-04-26 NOTE — Telephone Encounter (Signed)
Dr Lyndel Safe are you will to refill promethazine?  Paula [name] is there anything I should tell her?

## 2022-04-26 NOTE — Telephone Encounter (Signed)
Pt stated that she had three episodes of emesis last night and has been throwing up today at work; requesting phenergan refill. Pt stated that she was Dr. Julian Hy pt. Prescription sent to pharmacy. Pt verbalized understanding with all questions answered.

## 2022-04-27 NOTE — Telephone Encounter (Signed)
Please call in Phenergan 25 mg p.o. every 8 hours as needed, #20 with 2 refills Sedation precautions Do not drive while taking medication RG

## 2022-04-29 ENCOUNTER — Other Ambulatory Visit: Payer: Self-pay | Admitting: Hematology & Oncology

## 2022-04-29 DIAGNOSIS — C169 Malignant neoplasm of stomach, unspecified: Secondary | ICD-10-CM

## 2022-04-29 DIAGNOSIS — C163 Malignant neoplasm of pyloric antrum: Secondary | ICD-10-CM

## 2022-04-29 MED ORDER — HYDROCODONE-ACETAMINOPHEN 5-325 MG PO TABS: 1.0000 | ORAL_TABLET | Freq: Four times a day (QID) | ORAL | 0 refills | Status: DC | PRN

## 2022-05-02 ENCOUNTER — Telehealth: Payer: Self-pay | Admitting: Gastroenterology

## 2022-05-02 NOTE — Telephone Encounter (Signed)
Inbound call from patient requesting to speak with a nurse as soon as possible she want to have a sooner appt with Dr.Gupta she said she is having a rectal bleeding..my first available 4/23.Please advise

## 2022-05-03 ENCOUNTER — Encounter: Payer: Self-pay | Admitting: Family

## 2022-05-03 ENCOUNTER — Encounter: Payer: Self-pay | Admitting: Gastroenterology

## 2022-05-03 NOTE — Telephone Encounter (Signed)
Pt stated that she has 1 episode of dark blood Saturday morning in her Bed. BM have been regular prior and after that 1 occurrence. Pt stated that she has been vomiting increasing recently. 4 times yesterday. Pt was scheduled to see Ellouise Newer PA on 05/06/2022 at 11:00 AM Pt made aware: Pt verbalized understanding with all questions answered.

## 2022-05-04 NOTE — Telephone Encounter (Signed)
Spoke with pt yesterday. Pt has been scheduled for an office visit with Earnie Larsson PA on 05/06/2022 at 11:00 AM. Pt made aware.  Documented in phone note: Pt verbalized understanding with all questions answered.

## 2022-05-06 ENCOUNTER — Encounter: Payer: Self-pay | Admitting: Physician Assistant

## 2022-05-06 ENCOUNTER — Ambulatory Visit (INDEPENDENT_AMBULATORY_CARE_PROVIDER_SITE_OTHER): Payer: 59 | Admitting: Physician Assistant

## 2022-05-06 ENCOUNTER — Other Ambulatory Visit (INDEPENDENT_AMBULATORY_CARE_PROVIDER_SITE_OTHER): Payer: 59

## 2022-05-06 VITALS — BP 122/76 | HR 66 | Ht 61.0 in | Wt 136.0 lb

## 2022-05-06 DIAGNOSIS — R112 Nausea with vomiting, unspecified: Secondary | ICD-10-CM | POA: Diagnosis not present

## 2022-05-06 DIAGNOSIS — R195 Other fecal abnormalities: Secondary | ICD-10-CM

## 2022-05-06 DIAGNOSIS — K2270 Barrett's esophagus without dysplasia: Secondary | ICD-10-CM | POA: Diagnosis not present

## 2022-05-06 DIAGNOSIS — R1013 Epigastric pain: Secondary | ICD-10-CM | POA: Diagnosis not present

## 2022-05-06 LAB — CBC WITH DIFFERENTIAL/PLATELET
Basophils Absolute: 0.1 10*3/uL (ref 0.0–0.1)
Basophils Relative: 1.6 % (ref 0.0–3.0)
Eosinophils Absolute: 0.1 10*3/uL (ref 0.0–0.7)
Eosinophils Relative: 1.1 % (ref 0.0–5.0)
HCT: 37.1 % (ref 36.0–46.0)
Hemoglobin: 12 g/dL (ref 12.0–15.0)
Lymphocytes Relative: 30 % (ref 12.0–46.0)
Lymphs Abs: 2.4 10*3/uL (ref 0.7–4.0)
MCHC: 32.4 g/dL (ref 30.0–36.0)
MCV: 87.4 fl (ref 78.0–100.0)
Monocytes Absolute: 0.6 10*3/uL (ref 0.1–1.0)
Monocytes Relative: 7.5 % (ref 3.0–12.0)
Neutro Abs: 4.7 10*3/uL (ref 1.4–7.7)
Neutrophils Relative %: 59.8 % (ref 43.0–77.0)
Platelets: 326 10*3/uL (ref 150.0–400.0)
RBC: 4.24 Mil/uL (ref 3.87–5.11)
RDW: 13.7 % (ref 11.5–15.5)
WBC: 7.8 10*3/uL (ref 4.0–10.5)

## 2022-05-06 MED ORDER — SUCRALFATE 1 GM/10ML PO SUSP: 1.0000 g | Freq: Four times a day (QID) | ORAL | 0 refills | Status: AC

## 2022-05-06 NOTE — Patient Instructions (Addendum)
Your provider has requested that you go to the basement level for lab work before leaving today. Press "B" on the elevator. The lab is located at the first door on the left as you exit the elevator.  We have provided you with hemoccult cards x 3 to complete at home. Please follow the written instructions provided on the cards. Bring these back to the lab within 2 days of completion.  We have sent the following medications to your pharmacy for you to pick up at your convenience: Sucralfate  Continue pantoprazole 40 mg twice daily.  Increase your promethazine to every 8 hour dosing.  _______________________________________________________  If your blood pressure at your visit was 140/90 or greater, please contact your primary care physician to follow up on this.  _______________________________________________________  If you are age 56 or older, your body mass index should be between 23-30. Your Body mass index is 25.7 kg/m. If this is out of the aforementioned range listed, please consider follow up with your Primary Care Provider.  If you are age 110 or younger, your body mass index should be between 19-25. Your Body mass index is 25.7 kg/m. If this is out of the aformentioned range listed, please consider follow up with your Primary Care Provider.   ________________________________________________________  The [place] [facility] providers would like to encourage you to use [facility] to communicate with providers for non-urgent requests or questions.  Due to long hold times on the telephone, sending your provider a message by [facility] may be a faster and more efficient way to get a response.  Please allow 48 business hours for a response.  Please remember that this is for non-urgent requests.  _______________________________________________________  Due to recent changes in healthcare laws, you may see the results of your imaging and laboratory studies on MyChart before your provider has had a  chance to review them.  We understand that in some cases there may be results that are confusing or concerning to you. Not all laboratory results come back in the same time frame and the provider may be waiting for multiple results in order to interpret others.  Please give [place] 48 hours in order for your provider to thoroughly review all the results before contacting the office for clarification of your results.

## 2022-05-06 NOTE — Progress Notes (Signed)
Chief Complaint: Rectal bleeding  HPI:    [name] is a 57 year old female, known to Dr. Lyndel Safe, with a past medical history significant for stage II gastric adenocarcinoma status post Billroth II gastrectomy in 2015 followed by neoadjuvant chemoradiation, who presents to clinic today to discuss rectal bleeding.    01/18/2021 CT chest abdomen pelvis with contrast showed redemonstrated postoperative findings of distal gastrectomy and gastrojejunostomy, no evidence of recurrent or metastatic disease in the chest abdomen or pelvis, unchanged bandlike scarring and volume loss of the right lung base sequela of prior infection or inflammation.    01/20/2022 patient seen in clinic for nausea and vomiting.  At that time discussed history of gastric cancer.  EGD in January 2021 with a 2 cm Barrett's esophagus small gastric remnant with angular takeoff of a Farron/U currently moves, had stopped marijuana and smoking, at that time recommended EGD and Phenergan 25 mg p.o. OD.  Discussed a colonoscopy with patient refused screening.  Continue Pantoprazole 40 p.o. daily.    01/28/2022 EGD with esophageal mucosal changes consistent with short segment Barrett's, patent Billroth II gastrojejunostomy with erosion, erythema and edema, medium out of food residue in the stomach.  At that time increase Pantoprazole to 40 twice daily, small more frequent meals.  Continue IV iron.    04/20/2022 iron studies normal.  CBC with hemoglobin 11.1, improved over the past 3 months.    05/03/2022 patient called our clinic with some blood in her stool.  She was scheduled with me.    Today, patient explains that she was sleeping last Saturday, 04/30/2022 and woke up around 3 AM with some reflux symptoms and was coughing and got up to get out of the bed and noticed some dark maroonish spots on the bed cover that look like they could have been blood.  She then got up to go to the bathroom and felt like something was running down her leg and  it was actually loose stool.  She cleaned the bed and went to the bathroom but did not see any blood on the toilet paper after wiping or any further since then.  She is not even sure it was blood.  She has had no further instances like that since then.    Has continued with an increase in vomiting symptoms which is somewhat chronic for her regardless of Phenergan 25 mg twice daily.  Tells me she still on Pantoprazole 40 twice daily but has run out of Carafate.  Does describe that on some occasion she will use her Pantoprazole 3 times a day.  Does tell me that she had a vomiting episode yesterday and maybe saw some specks of blood in it.    Denies fever, chills or weight loss.  Past Medical History:  Diagnosis Date   Anxiety    per pt   Arthritis    back   Barrett esophagus    Costochondritis    Esophageal reflux    On omeprazole   Family history of cancer    Headache(784.0) 2011   Migraines   Heart murmur    never caused any problems   History of hiatal hernia    History of radiation therapy 05/12/14- 06/17/14   post-op gastric region/ nodes/ 45 Gy in 25 fractions.    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 [id number]   Wears partial dentures    upper dentures    Past Surgical History:  Procedure Laterality Date   CESAREAN SECTION     x 2   ESOPHAGOGASTRODUODENOSCOPY N/A 09/16/2013   Procedure: ESOPHAGOGASTRODUODENOSCOPY (EGD);  Surgeon: Wonda Horner, MD;  Location: [facility];  Service: Endoscopy;  Laterality: N/A;   ESOPHAGOGASTRODUODENOSCOPY  12/09/2019   [facility]   GASTROSTOMY     r/t stomach cancer   IR CV LINE INJECTION  08/30/2017   IR FLUORO GUIDE PORT INSERTION RIGHT  03/21/2017   IR US GUIDE VASC  ACCESS RIGHT  03/21/2017   LAPAROSCOPY N/A 09/20/2013   Procedure: DIAGNOSTIC LAPAROSCOPY,DISTAL GASTRECTOMY AND FEEDING GASTROJEJUNOSTOMY;  Surgeon: [name], MD;  Location: [facility];  Service: General;  Laterality: N/A;   OOPHORECTOMY     Around 1985 left ovary removal   PORTACATH PLACEMENT Left 11/18/2013   Procedure: INSERTION PORT-A-CATH;  Surgeon: [name], MD;  Location: [facility];  Service: General;  Laterality: Left;   TONSILLECTOMY     Around 1994   TUBAL LIGATION     UNILATERAL SALPINGECTOMY Left 11/09/2017   Procedure: UNILATERAL SALPINGECTOMY;  Surgeon: Osborne Oman, MD;  Location: [facility];  Service: Gynecology;  Laterality: Left;   UPPER GASTROINTESTINAL ENDOSCOPY     VAGINAL HYSTERECTOMY Left 11/09/2017   Procedure: HYSTERECTOMY VAGINAL WITH LEFT SALPINGECTOMY;  Surgeon: Osborne Oman, MD;  Location: [facility];  Service: Gynecology;  Laterality: Left;    Current Outpatient Medications  Medication Sig Dispense Refill   amoxicillin (AMOXIL) 500 MG tablet Take 500 mg by mouth 3 (three) times daily. (Patient not taking: Reported on 04/20/2022)     clindamycin (CLEOCIN) 300 MG capsule Take 300 mg by mouth 3 (three) times daily. (Patient not taking: Reported on 04/20/2022)     fluticasone (FLONASE) 50 MCG/ACT nasal spray Place 1 spray into both nostrils daily. 16 g 2   gabapentin (NEURONTIN) 600 MG tablet Take 1 tablet (600 mg total) by mouth 2 (two) times daily. 180 tablet 7   HYDROcodone-acetaminophen (NORCO/VICODIN) 5-325 MG tablet Take 1 tablet by mouth every 6 (six) hours as needed for moderate pain. 60 tablet 0   lidocaine (LIDODERM) 5 % Place 1 patch onto the skin daily. Remove & Discard patch within 12 hours or as directed by MD 30 patch 0   lidocaine-prilocaine (EMLA) cream Apply 1 application. topically as needed. 30 g 6   methocarbamol (ROBAXIN) 500 MG tablet Take 1 tablet (500 mg total) by mouth 2 (two) times daily. (Patient not taking: Reported on 04/20/2022) 20 tablet  0   Multiple Vitamins-Minerals (WOMENS MULTIVITAMIN PO) Take by mouth.     nitroGLYCERIN (NITROSTAT) 0.4 MG SL tablet Place 1 tablet (0.4 mg total) under the tongue every 5 (five) minutes as needed for chest pain. (Patient not taking: No sig reported) 25 tablet 2   pantoprazole (PROTONIX) 40 MG tablet Take 1 tablet (40 mg total) by mouth 2 (two) times daily. 60 tablet 6   promethazine (PHENERGAN) 25 MG tablet Take 1 tablet (25 mg total) by mouth 2 (two) times daily. 60 tablet 3   sucralfate (CARAFATE) 1 GM/10ML suspension Take 10 mLs (1 g total) by mouth 4 (four) times daily. Carafate elixir 1 g po 4 times daily x 2 weeks. 420 mL 0  No current facility-administered medications for this visit.   Facility-Administered Medications Ordered in Other Visits  Medication Dose Route Frequency Provider Last Rate Last Admin   sodium chloride flush (NS) 0.9 % injection 10 mL  10 mL Intravenous PRN Volanda Napoleon, MD   10 mL at 01/03/18 1316   sodium chloride flush (NS) 0.9 % injection 10 mL  10 mL Intravenous PRN Volanda Napoleon, MD   10 mL at 09/10/19 1114   sodium chloride flush (NS) 0.9 % injection 10 mL  10 mL Intravenous PRN Volanda Napoleon, MD   10 mL at 09/17/21 1023    Allergies as of 05/06/2022 - Review Complete 04/20/2022  Allergen Reaction Noted   Tape Itching 03/04/2015    Family History  Problem Relation Age of Onset   Hypertension Mother    Diabetes Mellitus II Mother    Other Mother        hx of hysterectomy for unspecified reason   Hypertension Sister    Hypertension Brother    Heart failure Maternal Grandmother    Heart Problems Maternal Grandmother    Hypertension Maternal Grandfather    Diabetes Maternal Grandfather    Colon cancer Paternal Uncle        dx. late 50s-early 60s   Gastric cancer Cousin 4   Colon cancer Cousin        dx. early-late 69s; (father also had colon cancer)   Colon cancer Paternal Uncle        dx. late 50s-early 60s   Lung cancer Paternal  Uncle        dx. 69s; smoker   Colon polyps Paternal Uncle        unknown number   Heart attack Maternal Aunt    Heart Problems Paternal Aunt    Heart attack Paternal Grandfather    Colon cancer Paternal Grandfather        dx. 63s-60s   Colon polyps Brother        1 maternal half-brother had approx 3 polyps   Breast cancer Cousin 30   Breast cancer Cousin        dx. early 33s or earlier (father had colon cancer)   Lung cancer Other        PGF's brothers (x4); dx. later in life; lim info   Esophageal cancer Neg Hx    Rectal cancer Neg Hx     Social History   Socioeconomic History   Marital status: Legally Separated    Spouse name: Paula [name]   Number of children: 2   Years of education: Not on file   Highest education level: Not on file  Occupational History    Employer: [employer]  Tobacco Use   Smoking status: Former    Packs/day: 1.00    Years: 16.00    Additional pack years: 0.00    Total pack years: 16.00    Types: Cigarettes    Quit date: 02/21/1998    Years since quitting: 24.2   Smokeless tobacco: Never   Tobacco comments:    0.5-1.0 ppd for 16 yrs  Vaping Use   Vaping Use: Never used  Substance and Sexual Activity   Alcohol use: Not Currently   Drug use: Not Currently   Sexual activity: Not Currently    Birth control/protection: Surgical  Other Topics Concern   Not on file  Social History Narrative   Not on file   Social Determinants of Health   Financial Resource Strain: Not on  file  Food Insecurity: No Food Insecurity (02/17/2020)   Hunger Vital Sign    Worried About Running Out of Food in the Last Year: Never true    Ran Out of Food in the Last Year: Never true  Transportation Needs: Unmet Transportation Needs (02/17/2020)   PRAPARE - Hydrologist (Medical): Yes    Lack of Transportation (Non-Medical): Yes  Physical Activity: Not on file  Stress: Not on file  Social Connections: Not on file  Intimate  Partner Violence: Not on file    Review of Systems:    Constitutional: No weight loss, fever or chills Cardiovascular: No chest pain  Respiratory: No SOB  Gastrointestinal: See HPI and otherwise negative   Physical Exam:  Vital signs: BP 122/76   Pulse 66   Ht 5\' 1"  (1.549 m)   Wt 136 lb (61.7 kg)   LMP 02/28/2016 (Exact Date)   BMI 25.70 kg/m    Constitutional:   Pleasant AA female appears to be in NAD, Well developed, Well nourished, alert and cooperative Head:  Normocephalic and atraumatic. Eyes:   PEERL, EOMI. No icterus. Conjunctiva pink. Ears:  Normal auditory acuity. Neck:  Supple Throat: Oral cavity and pharynx without inflammation, swelling or lesion.  Respiratory: Respirations even and unlabored. Lungs clear to auscultation bilaterally.   No wheezes, crackles, or rhonchi.  Cardiovascular: Normal S1, S2. No MRG. Regular rate and rhythm. No peripheral edema, cyanosis or pallor.  Gastrointestinal:  Soft, nondistended, mild generalized ttp, No rebound or guarding. Normal bowel sounds. No appreciable masses or hepatomegaly. Rectal: Declined Msk:  Symmetrical without gross deformities. Without edema, no deformity or joint abnormality.  Neurologic:  Alert and  oriented x4;  grossly normal neurologically.  Skin:   Dry and intact without significant lesions or rashes. Psychiatric: Oriented to person, place and time. Demonstrates good judgement and reason without abnormal affect or behaviors.  RELEVANT LABS AND IMAGING: CBC    Component Value Date/Time   WBC 4.3 04/20/2022 1255   WBC 7.3 12/18/2019 1913   RBC 3.98 04/20/2022 1255   RBC 3.99 04/20/2022 1255   HGB 11.1 (L) 04/20/2022 1255   HGB 10.7 (L) 12/30/2020 1506   HGB 11.6 01/26/2017 1002   HGB 10.9 (L) 05/06/2015 0948   HCT 35.6 (L) 04/20/2022 1255   HCT 34.4 12/30/2020 1506   HCT 36.3 01/26/2017 1002   HCT 33.5 (L) 05/06/2015 0948   PLT 233 04/20/2022 1255   PLT 227 12/30/2020 1506   MCV 89.4 04/20/2022 1255    MCV 90 12/30/2020 1506   MCV 90 01/26/2017 1002   MCV 87.7 05/06/2015 0948   MCH 27.9 04/20/2022 1255   MCHC 31.2 04/20/2022 1255   RDW 13.8 04/20/2022 1255   RDW 13.0 12/30/2020 1506   RDW 13.6 01/26/2017 1002   RDW 15.1 (H) 05/06/2015 0948   LYMPHSABS 2.0 04/20/2022 1255   LYMPHSABS 1.9 12/30/2020 1506   LYMPHSABS 1.5 01/26/2017 1002   LYMPHSABS 0.6 (L) 05/06/2015 0948   MONOABS 0.4 04/20/2022 1255   MONOABS 0.2 05/06/2015 0948   EOSABS 0.2 04/20/2022 1255   EOSABS 0.1 12/30/2020 1506   EOSABS 0.1 01/26/2017 1002   BASOSABS 0.0 04/20/2022 1255   BASOSABS 0.0 12/30/2020 1506   BASOSABS 0.0 01/26/2017 1002   BASOSABS 0.1 05/06/2015 0948    CMP     Component Value Date/Time   NA 138 04/20/2022 1255   NA 140 12/30/2020 1506   NA 140 01/26/2017 1002  NA 140 09/24/2015 0953   K 4.5 04/20/2022 1255   K 3.5 01/26/2017 1002   K 4.0 09/24/2015 0953   CL 102 04/20/2022 1255   CL 106 01/26/2017 1002   CO2 30 04/20/2022 1255   CO2 26 01/26/2017 1002   CO2 23 09/24/2015 0953   GLUCOSE 104 (H) 04/20/2022 1255   GLUCOSE 105 01/26/2017 1002   BUN 10 04/20/2022 1255   BUN 14 12/30/2020 1506   BUN 6 (L) 01/26/2017 1002   BUN 8.6 09/24/2015 0953   CREATININE 1.18 (H) 04/20/2022 1255   CREATININE 1.1 01/26/2017 1002   CREATININE 1.0 09/24/2015 0953   CALCIUM 9.5 04/20/2022 1255   CALCIUM 8.9 01/26/2017 1002   CALCIUM 9.6 09/24/2015 0953   PROT 7.0 04/20/2022 1255   PROT 6.2 12/30/2020 1506   PROT 6.8 01/26/2017 1002   PROT 7.4 09/24/2015 0953   ALBUMIN 4.2 04/20/2022 1255   ALBUMIN 4.2 12/30/2020 1506   ALBUMIN 4.0 08/08/2016 1404   ALBUMIN 4.0 09/24/2015 0953   AST 18 04/20/2022 1255   AST 17 09/24/2015 0953   ALT 11 04/20/2022 1255   ALT 17 01/26/2017 1002   ALT 11 09/24/2015 0953   ALKPHOS 68 04/20/2022 1255   ALKPHOS 68 01/26/2017 1002   ALKPHOS 99 09/24/2015 0953   BILITOT 0.4 04/20/2022 1255   BILITOT 0.83 09/24/2015 0953   GFRNONAA 55 (L) 04/20/2022 1255    GFRAA >60 10/17/2019 1956   GFRAA >60 09/10/2019 1030    Assessment: 1.  Generalized abdominal pain: Some tenderness all over today on exam, likely related to vomiting 2.  Chronic nausea and vomiting: Negative CTAP for recurrence of gastric cancer, thought possibly due to narcotic/gastroparesis, typically maintained on Phenergan 3.  Change in bowel habits: With some looser darker stools, patient concern for bleeding  4.  Screening for colorectal cancer: Patient has never had colonoscopy, hard to prep in the past 5.  History of Barrett's esophagus: Last EGD 01/28/2022 with repeat recommended in 3 years  Plan: 1.  Patient is worried that she has been seeing blood.  Ordered a Hemoccult x 3 study and sent home with her as well as ordered a CBC today to check hemoglobin.  Discussed with patient that if her hemoglobin is stable and Hemoccult is negative there is no rush for anything.  Otherwise we will make further decisions based on that. 2.  Patient did discuss a colonoscopy with me.  She tells me she never declined this in the past it just was that her stomach is small and she could not get the bowel prep in.  She is open to doing one and would like to know Dr. Steve Rattler recommendations for bowel prep given her previous gastrectomy. 3.  Refilled Carafate, discussed using this an hour before 2 hours after other medications 4.  Continue Pantoprazole 40 twice daily 5.  Discussed with patient she could increase her Phenergan to 25 mg every 8 hours for now.  She tells me she has enough medicine at home to do this. 6.  We did discuss diagnosis of Barrett's esophagus.  Explained this to her in detail. 7.  Patient to follow in clinic with [place] as needed/per recommendations after CBC/Hemoccult study  Ellouise Newer, PA-C [facility] 05/06/2022, 11:13 AM  Cc: Marcie Mowers, FNP

## 2022-05-15 NOTE — Progress Notes (Signed)
Agree with assessment/plan.  Raj Rhonda Linan, MD [facility] [phone]  

## 2022-05-20 ENCOUNTER — Encounter: Payer: Self-pay | Admitting: Gastroenterology

## 2022-05-24 ENCOUNTER — Telehealth: Payer: Self-pay

## 2022-05-24 DIAGNOSIS — Z1211 Encounter for screening for malignant neoplasm of colon: Secondary | ICD-10-CM

## 2022-05-24 MED ORDER — METOCLOPRAMIDE HCL 10 MG PO TABS: ORAL_TABLET | ORAL | 0 refills | Status: DC

## 2022-05-24 NOTE — Telephone Encounter (Signed)
-----   Message from Levin Erp, [place] sent at 05/23/2022  8:32 AM EDT ----- Regarding: FW: colon Please schedule [facility] Colonoscopy with Miralax prep and Reglan 10mg  x2. Thanks-JLL ----- Message ----- From: Jackquline Denmark, MD Sent: 05/15/2022  11:09 AM EDT To: Levin Erp, PA Subject: RE: colon                                      Can try miralax prep Can certainly try to set her up. I do believe she qualifies for [place] criteria  RG ----- Message ----- From: Levin Erp, PA Sent: 05/06/2022  12:12 PM EDT To: Jackquline Denmark, MD Subject: colon                                          Patient interested in colonoscopy.  Previously due to gastrectomy unable to drink all of the bowel prep.  Patient wanted to recommendations on how to get this accomplished.  Thanks, JL L

## 2022-05-24 NOTE — Telephone Encounter (Signed)
Called and spoke with patient to schedule colonoscopy. Patient has been scheduled for a colonoscopy in the [facility] on Friday, 07/22/22 at 3:30 pm. Patient has been advised that she will need to arrive by 2:30 pm with a care partner and care partner must stay the entire time. Patient has access to MyChart and understands that she will receive instructions via MyChart today and will also receive a copy in the mail soon. Patient is aware that we are sending in RX for Reglan that she will take prior to each half of prep. Pt verbalized understanding of all information and had no concerns at the end of the call.   Ambulatory referral to GI in epic. Colonoscopy instructions mailed and sent to patient via [place]. Reglan RX sent to [facility] pharmacy per pt request.

## 2022-05-30 ENCOUNTER — Other Ambulatory Visit: Payer: Self-pay | Admitting: Hematology & Oncology

## 2022-05-30 DIAGNOSIS — C163 Malignant neoplasm of pyloric antrum: Secondary | ICD-10-CM

## 2022-05-30 DIAGNOSIS — C169 Malignant neoplasm of stomach, unspecified: Secondary | ICD-10-CM

## 2022-05-30 MED ORDER — HYDROCODONE-ACETAMINOPHEN 5-325 MG PO TABS: 1.0000 | ORAL_TABLET | Freq: Four times a day (QID) | ORAL | 0 refills | Status: DC | PRN

## 2022-05-30 NOTE — Telephone Encounter (Signed)
Last refilled 04/29/22 # 60. Please advise, thanks.

## 2022-06-06 ENCOUNTER — Encounter: Payer: Self-pay | Admitting: *Deleted

## 2022-06-15 ENCOUNTER — Encounter (HOSPITAL_BASED_OUTPATIENT_CLINIC_OR_DEPARTMENT_OTHER): Payer: Self-pay | Admitting: Emergency Medicine

## 2022-06-15 ENCOUNTER — Emergency Department (HOSPITAL_BASED_OUTPATIENT_CLINIC_OR_DEPARTMENT_OTHER)
Admission: EM | Admit: 2022-06-15 | Discharge: 2022-06-15 | Disposition: A | Discharge status: Home / Self Care | Payer: 59 | Attending: Emergency Medicine | Admitting: Emergency Medicine

## 2022-06-15 ENCOUNTER — Encounter: Payer: Self-pay | Admitting: Family

## 2022-06-15 ENCOUNTER — Other Ambulatory Visit: Payer: Self-pay

## 2022-06-15 ENCOUNTER — Emergency Department (HOSPITAL_BASED_OUTPATIENT_CLINIC_OR_DEPARTMENT_OTHER): Payer: 59

## 2022-06-15 DIAGNOSIS — Z79899 Other long term (current) drug therapy: Secondary | ICD-10-CM | POA: Insufficient documentation

## 2022-06-15 DIAGNOSIS — M25562 Pain in left knee: Secondary | ICD-10-CM | POA: Insufficient documentation

## 2022-06-15 DIAGNOSIS — Z85028 Personal history of other malignant neoplasm of stomach: Secondary | ICD-10-CM | POA: Insufficient documentation

## 2022-06-15 DIAGNOSIS — I10 Essential (primary) hypertension: Secondary | ICD-10-CM | POA: Diagnosis not present

## 2022-06-15 NOTE — ED Triage Notes (Addendum)
Pt fell yesterday; c/o LT knee pain; swelling noted; also reports CP yesterday; still has "a little" today; denies other sxs

## 2022-06-15 NOTE — ED Notes (Addendum)
Unable to access port. Needle went through port with no issues, but not able to flush or draw back blood. Patient states she has had these issues before. EDP made aware

## 2022-06-15 NOTE — ED Provider Notes (Signed)
[facility] Provider Note   CSN: [id number] Arrival date & time: 06/15/22  1202     History  Chief Complaint  Patient presents with   Fall    Paula [name] is a 57 y.o. female.  She has history of hypertension, and gastric carcinoma in remission presents to ER complaining of.  She states she was in the kitchen slipped on something wet 2 days ago and fell directly on the left knee has been having pain since then but able to bear weight and ambulate.  Patient mentioned that she is having chest pain, chest pain orders have been initiated.  They were unable to access the patient's port.  When I asked patient if she has been having chest pain she states she had some pain below her right breast last week and she had several coworkers who is similar symptoms, 1 of whom was diagnosed with pneumonia and she was a chest x-ray to make sure she did not have pneumonia though she has had no pain since then and is specifically pain-free now, no shortness of breath, no radiation of pain, no exertional symptoms.  She states she came in only for the knee and did not want any cardiac workup.   Fall       Home Medications Prior to Admission medications   Medication Sig Start Date End Date Taking? Authorizing Provider  amoxicillin (AMOXIL) 500 MG tablet Take 500 mg by mouth 3 (three) times daily. Patient not taking: Reported on 04/20/2022 01/27/22   [provider]  clindamycin (CLEOCIN) 300 MG capsule Take 300 mg by mouth 3 (three) times daily. Patient not taking: Reported on 04/20/2022 11/10/21   [provider]  fluticasone (FLONASE) 50 MCG/ACT nasal spray Place 1 spray into both nostrils daily. 12/10/18   Khatri, Hina, PA-C  gabapentin (NEURONTIN) 600 MG tablet Take 1 tablet (600 mg total) by mouth 2 (two) times daily. 11/15/21 01/28/22  Anyanwu, Jethro Bastos, MD  HYDROcodone-acetaminophen (NORCO/VICODIN) 5-325 MG tablet Take 1 tablet by  mouth every 6 (six) hours as needed for moderate pain. 05/30/22   Josph Macho, MD  lidocaine (LIDODERM) 5 % Place 1 patch onto the skin daily. Remove & Discard patch within 12 hours or as directed by MD 09/17/21   Redwine, Madison A, PA-C  lidocaine-prilocaine (EMLA) cream Apply 1 application. topically as needed. 07/27/21   Josph Macho, MD  methocarbamol (ROBAXIN) 500 MG tablet Take 1 tablet (500 mg total) by mouth 2 (two) times daily. Patient not taking: Reported on 04/20/2022 09/17/21   Redwine, Madison A, PA-C  metoCLOPramide (REGLAN) 10 MG tablet Take 1 tablet 30 minutes prior to each half of prep 05/24/22   Unk Lightning, PA  Multiple Vitamins-Minerals (WOMENS MULTIVITAMIN PO) Take by mouth.    [provider]  nitroGLYCERIN (NITROSTAT) 0.4 MG SL tablet Place 1 tablet (0.4 mg total) under the tongue every 5 (five) minutes as needed for chest pain. Patient not taking: No sig reported 12/20/18 05/15/19  Jodelle Gross, NP  pantoprazole (PROTONIX) 40 MG tablet Take 1 tablet (40 mg total) by mouth 2 (two) times daily. 02/02/22   Lynann Bologna, MD  promethazine (PHENERGAN) 25 MG tablet Take 1 tablet (25 mg total) by mouth 2 (two) times daily. 04/26/22   Lynann Bologna, MD  sucralfate (CARAFATE) 1 GM/10ML suspension Take 10 mLs (1 g total) by mouth 4 (four) times daily. 05/06/22   Unk Lightning, PA  Allergies    Tape    Review of Systems   Review of Systems  Physical Exam Updated Vital Signs BP 110/65 (BP Location: Right Arm)   Pulse 74   Temp 97.7 F (36.5 C) (Oral)   Resp 16   Ht  (1.549 m)   Wt 70.3 kg   LMP 02/28/2016 (Exact Date)   SpO2 100%   BMI 29.29 kg/m  Physical Exam Vitals and nursing note reviewed.  Constitutional:      General: She is not in acute distress.    Appearance: She is well-developed.  HENT:     Head: Normocephalic and atraumatic.  Eyes:     Conjunctiva/sclera: Conjunctivae normal.  Cardiovascular:     Rate and  Rhythm: Normal rate and regular rhythm.     Heart sounds: No murmur heard. Pulmonary:     Effort: Pulmonary effort is normal. No respiratory distress.     Breath sounds: Normal breath sounds.  Abdominal:     Palpations: Abdomen is soft.     Tenderness: There is no abdominal tenderness.  Musculoskeletal:        General: No swelling.     Cervical back: Neck supple.     Comments: Mild swelling to left knee, patient can bend to about 45 degrees but is somewhat limited to pain.  He can fully extend without difficulty.  No defect in the patellar tendon are noted there is tenderness over the patella with no crepitus.  No tenderness over the tibial plateau  DP and PT pulse and left foot are 2+, sensation intact to light touch throughout left lower extremity.  No pain to the calf ankle or hip.  Skin:    General: Skin is warm and dry.     Capillary Refill: Capillary refill takes less than 2 seconds.  Neurological:     General: No focal deficit present.     Mental Status: She is alert and oriented to person, place, and time.     Sensory: No sensory deficit.     Motor: No weakness.  Psychiatric:        Mood and Affect: Mood normal.     ED Results / Procedures / Treatments   Labs (all labs ordered are listed, but only abnormal results are displayed) Labs Reviewed - No data to display  EKG None  Radiology DG Knee Complete 4 Views Left  Result Date: 06/15/2022 CLINICAL DATA:  Larey Seat yesterday with knee pain EXAM: LEFT KNEE - COMPLETE 4+ VIEW COMPARISON:  None Available. FINDINGS: No evidence of fracture, dislocation, or joint effusion. No evidence of arthropathy or other focal bone abnormality. Soft tissues are unremarkable. IMPRESSION: Negative. Electronically Signed   By: Paulina Fusi M.D.   On: 06/15/2022 13:03   DG Chest 2 View  Result Date: 06/15/2022 CLINICAL DATA:  Chest pain beginning 2-3 days ago. EXAM: CHEST - 2 VIEW COMPARISON:  07/24/2021 FINDINGS: Heart size is normal.  Mediastinal shadows are normal. Power port on the right with the tip in the SVC above the right atrium. Chronic pleural and parenchymal scarring in the right lower chest. No sign of active infiltrate, effusion or collapse. No abnormal bone finding. IMPRESSION: No active disease. Chronic pleural and parenchymal scarring in the right lower chest. Electronically Signed   By: Paulina Fusi M.D.   On: 06/15/2022 13:01    Procedures Procedures    Medications Ordered in ED Medications - No data to display  ED Course/ Medical Decision Making/ A&P  Medical Decision Making This patient presents to the ED for concern of knee pain after fall several days ago, this involves an extensive number of treatment options, and is a complaint that carries with it a high risk of complications and morbidity.  The differential diagnosis includes, sprain, strain, contusion, dislocation, tendon rupture, other   Co morbidities that complicate the patient evaluation  History of gastric cancer, hypertension   Additional history obtained:  Additional history obtained from EMR External records from outside source obtained and reviewed including outpatient notes   Lab Tests:  I Ordered, and personally interpreted labs.  The pertinent results include: CBC, BMP and troponin ordered upon arrival to triage as patient had reported chest pain remotely.  She refused these labs after we could not access her port, states she did not want these labs and that is not why she came in, not having chest pain   Imaging Studies ordered:  I ordered imaging studies including chest x-ray; left knee x-ray I independently visualized and interpreted imaging which showed no acute cardiopulmonary disease: No fracture or misalignment to the left knee I agree with the radiologist interpretation     Problem List / ED Course / Critical interventions / Medication management  Patient here for left knee pain  after a fall couple days ago, she is some mild tenderness but no crepitus, x-rays are normal, she feels much better after the knee sleeve and crutches to help with ambulation.  Advised on orthopedics follow-up.  Chest x-ray normal open ordered at triage.  EKG also ordered, no hemic changes.  Patient presents to the ER by her need to be evaluated.  She had mentioned to triage nurse some remote chest pain.  She is not having pain now, she does not want this evaluated at this time.  She states she has some coworkers who had chest pain last week when she was having it and she wanted to make sure she did not have pneumonia because of 1 of them had pneumonia I have reviewed the patients home medicines and have made adjustments as needed       Amount and/or Complexity of Data Reviewed Radiology: ordered.           Final Clinical Impression(s) / ED Diagnoses Final diagnoses:  Acute pain of left knee    Rx / DC Orders ED Discharge Orders     None         Josem Kaufmann 06/15/22 1813    Tegeler, Canary Brim, MD 06/15/22 1819

## 2022-06-15 NOTE — Discharge Instructions (Addendum)
You are seen today for knee pain after a fall.  Your x-ray does not show any broken bones.  It is likely a sprain or a bad bruise.  Use a knee brace and crutches and follow-up with orthopedics, use Tylenol Motrin as needed for pain, come back to the ER for new or worsening symptoms.

## 2022-06-20 ENCOUNTER — Inpatient Hospital Stay: Payer: 59 | Attending: Hematology & Oncology

## 2022-06-20 DIAGNOSIS — Z85028 Personal history of other malignant neoplasm of stomach: Secondary | ICD-10-CM | POA: Diagnosis present

## 2022-06-20 DIAGNOSIS — Z95828 Presence of other vascular implants and grafts: Secondary | ICD-10-CM

## 2022-06-20 DIAGNOSIS — Z452 Encounter for adjustment and management of vascular access device: Secondary | ICD-10-CM | POA: Insufficient documentation

## 2022-06-20 MED ORDER — SODIUM CHLORIDE 0.9% FLUSH
10.0000 mL | Freq: Once | INTRAVENOUS | Status: AC
  Administered 2022-06-20: 10 mL via INTRAVENOUS

## 2022-06-20 MED ORDER — HEPARIN SOD (PORK) LOCK FLUSH 100 UNIT/ML IV SOLN
500.0000 [IU] | Freq: Once | INTRAVENOUS | Status: AC
  Administered 2022-06-20: 500 [IU] via INTRAVENOUS

## 2022-06-20 NOTE — Patient Instructions (Signed)

## 2022-06-21 NOTE — Progress Notes (Signed)
Cancer Claim Application for [employer] Benefits completed and sent to [facility] for patient pick up.

## 2022-06-29 ENCOUNTER — Other Ambulatory Visit: Payer: Self-pay | Admitting: Hematology & Oncology

## 2022-06-29 DIAGNOSIS — C163 Malignant neoplasm of pyloric antrum: Secondary | ICD-10-CM

## 2022-06-29 DIAGNOSIS — C169 Malignant neoplasm of stomach, unspecified: Secondary | ICD-10-CM

## 2022-06-29 MED ORDER — HYDROCODONE-ACETAMINOPHEN 5-325 MG PO TABS: 1.0000 | ORAL_TABLET | Freq: Four times a day (QID) | ORAL | 0 refills | Status: DC | PRN

## 2022-07-11 ENCOUNTER — Encounter: Payer: Self-pay | Admitting: Gastroenterology

## 2022-07-18 ENCOUNTER — Encounter: Payer: Self-pay | Admitting: Certified Registered Nurse Anesthetist

## 2022-07-22 ENCOUNTER — Encounter: Payer: 59 | Admitting: Gastroenterology

## 2022-07-29 ENCOUNTER — Other Ambulatory Visit (HOSPITAL_BASED_OUTPATIENT_CLINIC_OR_DEPARTMENT_OTHER): Payer: Self-pay

## 2022-07-29 ENCOUNTER — Other Ambulatory Visit: Payer: Self-pay | Admitting: Hematology & Oncology

## 2022-07-29 DIAGNOSIS — C169 Malignant neoplasm of stomach, unspecified: Secondary | ICD-10-CM

## 2022-07-29 DIAGNOSIS — C163 Malignant neoplasm of pyloric antrum: Secondary | ICD-10-CM

## 2022-08-01 ENCOUNTER — Other Ambulatory Visit: Payer: Self-pay | Admitting: Obstetrics & Gynecology

## 2022-08-01 ENCOUNTER — Other Ambulatory Visit: Payer: Self-pay | Admitting: *Deleted

## 2022-08-01 ENCOUNTER — Other Ambulatory Visit: Payer: Self-pay | Admitting: Hematology & Oncology

## 2022-08-01 ENCOUNTER — Other Ambulatory Visit (HOSPITAL_BASED_OUTPATIENT_CLINIC_OR_DEPARTMENT_OTHER): Payer: Self-pay

## 2022-08-01 ENCOUNTER — Encounter: Payer: Self-pay | Admitting: *Deleted

## 2022-08-01 DIAGNOSIS — C169 Malignant neoplasm of stomach, unspecified: Secondary | ICD-10-CM

## 2022-08-01 DIAGNOSIS — N951 Menopausal and female climacteric states: Secondary | ICD-10-CM

## 2022-08-01 DIAGNOSIS — C163 Malignant neoplasm of pyloric antrum: Secondary | ICD-10-CM

## 2022-08-01 DIAGNOSIS — R110 Nausea: Secondary | ICD-10-CM

## 2022-08-01 MED ORDER — PROMETHAZINE HCL 25 MG PO TABS
25.0000 mg | ORAL_TABLET | Freq: Two times a day (BID) | ORAL | 3 refills | Status: DC
Start: 2022-08-01 — End: 2022-09-28

## 2022-08-01 MED ORDER — HYDROCODONE-ACETAMINOPHEN 5-325 MG PO TABS: 1.0000 | ORAL_TABLET | Freq: Four times a day (QID) | ORAL | 0 refills | Status: DC | PRN

## 2022-08-01 MED ORDER — GABAPENTIN 600 MG PO TABS
600.0000 mg | ORAL_TABLET | Freq: Two times a day (BID) | ORAL | 3 refills | Status: AC
Start: 2022-08-01 — End: 2022-11-29

## 2022-08-01 NOTE — Progress Notes (Signed)
Lidocaine 5% patch approved by plan on 07/28/22 with no expiration date given.  Pharmacy notified and was able to run for $0 copay.

## 2022-08-02 ENCOUNTER — Encounter: Payer: 59 | Admitting: Gastroenterology

## 2022-08-02 ENCOUNTER — Encounter: Payer: Self-pay | Admitting: Family

## 2022-08-02 ENCOUNTER — Encounter: Payer: Self-pay | Admitting: Certified Registered Nurse Anesthetist

## 2022-08-02 MED ORDER — HYDROCODONE-ACETAMINOPHEN 5-325 MG PO TABS: 1.0000 | ORAL_TABLET | Freq: Four times a day (QID) | ORAL | 0 refills | Status: DC | PRN

## 2022-08-08 ENCOUNTER — Encounter: Payer: 59 | Admitting: Gastroenterology

## 2022-08-09 ENCOUNTER — Other Ambulatory Visit: Payer: Self-pay

## 2022-08-09 DIAGNOSIS — D500 Iron deficiency anemia secondary to blood loss (chronic): Secondary | ICD-10-CM

## 2022-08-10 ENCOUNTER — Inpatient Hospital Stay: Payer: 59

## 2022-08-10 ENCOUNTER — Inpatient Hospital Stay: Payer: 59 | Attending: Hematology & Oncology

## 2022-08-10 VITALS — BP 113/62 | HR 73 | Resp 18

## 2022-08-10 DIAGNOSIS — Z452 Encounter for adjustment and management of vascular access device: Secondary | ICD-10-CM | POA: Insufficient documentation

## 2022-08-10 DIAGNOSIS — D500 Iron deficiency anemia secondary to blood loss (chronic): Secondary | ICD-10-CM

## 2022-08-10 DIAGNOSIS — Z85028 Personal history of other malignant neoplasm of stomach: Secondary | ICD-10-CM | POA: Insufficient documentation

## 2022-08-10 DIAGNOSIS — Z95828 Presence of other vascular implants and grafts: Secondary | ICD-10-CM

## 2022-08-10 DIAGNOSIS — D509 Iron deficiency anemia, unspecified: Secondary | ICD-10-CM | POA: Diagnosis present

## 2022-08-10 LAB — FERRITIN: Ferritin: 59 ng/mL (ref 11–307)

## 2022-08-10 LAB — CBC WITH DIFFERENTIAL (CANCER CENTER ONLY)
Abs Immature Granulocytes: 0.01 10*3/uL (ref 0.00–0.07)
Basophils Absolute: 0 10*3/uL (ref 0.0–0.1)
Basophils Relative: 0 %
Eosinophils Absolute: 0.1 10*3/uL (ref 0.0–0.5)
Eosinophils Relative: 2 %
HCT: 33.2 % — ABNORMAL LOW (ref 36.0–46.0)
Hemoglobin: 10.3 g/dL — ABNORMAL LOW (ref 12.0–15.0)
Immature Granulocytes: 0 %
Lymphocytes Relative: 37 %
Lymphs Abs: 1.9 10*3/uL (ref 0.7–4.0)
MCH: 27.9 pg (ref 26.0–34.0)
MCHC: 31 g/dL (ref 30.0–36.0)
MCV: 90 fL (ref 80.0–100.0)
Monocytes Absolute: 0.4 10*3/uL (ref 0.1–1.0)
Monocytes Relative: 8 %
Neutro Abs: 2.7 10*3/uL (ref 1.7–7.7)
Neutrophils Relative %: 53 %
Platelet Count: 236 10*3/uL (ref 150–400)
RBC: 3.69 MIL/uL — ABNORMAL LOW (ref 3.87–5.11)
RDW: 13.9 % (ref 11.5–15.5)
WBC Count: 5.1 10*3/uL (ref 4.0–10.5)
nRBC: 0 % (ref 0.0–0.2)

## 2022-08-10 LAB — CMP (CANCER CENTER ONLY)
ALT: 15 U/L (ref 0–44)
AST: 16 U/L (ref 15–41)
Albumin: 3.8 g/dL (ref 3.5–5.0)
Alkaline Phosphatase: 69 U/L (ref 38–126)
Anion gap: 4 — ABNORMAL LOW (ref 5–15)
BUN: 18 mg/dL (ref 6–20)
CO2: 32 mmol/L (ref 22–32)
Calcium: 9.2 mg/dL (ref 8.9–10.3)
Chloride: 102 mmol/L (ref 98–111)
Creatinine: 0.91 mg/dL (ref 0.44–1.00)
GFR, Estimated: >60 mL/min (ref >60)
Glucose, Bld: 88 mg/dL (ref 70–99)
Potassium: 4.3 mmol/L (ref 3.5–5.1)
Sodium: 138 mmol/L (ref 135–145)
Total Bilirubin: 0.4 mg/dL (ref 0.3–1.2)
Total Protein: 6.3 g/dL — ABNORMAL LOW (ref 6.5–8.1)

## 2022-08-10 MED ORDER — ALTEPLASE 2 MG IJ SOLR
2.0000 mg | Freq: Once | INTRAMUSCULAR | Status: AC
  Administered 2022-08-10: 2 mg
  Filled 2022-08-10: qty 2

## 2022-08-10 MED ORDER — ALTEPLASE 2 MG IJ SOLR: 2.0000 mg | Freq: Once | INTRAMUSCULAR | Status: DC

## 2022-08-10 MED ORDER — HEPARIN SOD (PORK) LOCK FLUSH 100 UNIT/ML IV SOLN
500.0000 [IU] | Freq: Once | INTRAVENOUS | Status: AC
  Administered 2022-08-10: 500 [IU] via INTRAVENOUS

## 2022-08-10 MED ORDER — ALTEPLASE 2 MG IJ SOLR
2.0000 mg | Freq: Once | INTRAMUSCULAR | Status: AC | PRN
  Administered 2022-08-10: 2 mg

## 2022-08-10 MED ORDER — SODIUM CHLORIDE 0.9% FLUSH
10.0000 mL | Freq: Once | INTRAVENOUS | Status: AC
  Administered 2022-08-10: 10 mL via INTRAVENOUS

## 2022-08-10 NOTE — Patient Instructions (Signed)

## 2022-08-10 NOTE — Progress Notes (Signed)
This RN accessed port after another nurse asked me too after they had accessed with no blood return. Port accessed, flushed easily, no blood return noted.No complaints or concerns from patient during port access or during flushing. After multiple failed attempts of position changing. Cath-flo administered. Positive blood return noted and labs obtianed via port. Port flushed per protocol. Patient discharged ambulatory without complaints or concerns.

## 2022-08-11 ENCOUNTER — Telehealth: Payer: Self-pay

## 2022-08-11 LAB — IRON AND IRON BINDING CAPACITY (CC-WL,HP ONLY)
Iron: 65 ug/dL (ref 28–170)
Saturation Ratios: 24 % (ref 10.4–31.8)
TIBC: 267 ug/dL (ref 250–450)
UIBC: 202 ug/dL (ref 148–442)

## 2022-08-11 NOTE — Telephone Encounter (Signed)
Advised via MyChart.

## 2022-08-11 NOTE — Telephone Encounter (Signed)
-----   Message from Josph Macho, MD sent at 08/11/2022 11:31 AM EDT ----- Please call let her know that the iron level looks okay.  Thanks.Marland Kitchen

## 2022-08-17 ENCOUNTER — Inpatient Hospital Stay: Payer: 59 | Admitting: Licensed Clinical Social Worker

## 2022-08-17 NOTE — Progress Notes (Signed)
[facility] CSW Progress Note  Clinical Child psychotherapist contacted patient by phone to follow up after she left a vm for RN.  She requested a letter for her employer to take a mental health day.  After further discussion, she said she was going to speak with her supervisor and ask for a day off.  She stated she feels overwhelmed.  She is receiving counseling with a Architectural technologist at [facility] the [place].  She was alert and oriented x3.  She did not express harming herself or anyone else.  Plan is for CSW to following up next week.    Dorothey Baseman, LCSW Clinical Social Worker [facility]

## 2022-08-30 ENCOUNTER — Other Ambulatory Visit: Payer: Self-pay | Admitting: Hematology & Oncology

## 2022-08-30 DIAGNOSIS — C163 Malignant neoplasm of pyloric antrum: Secondary | ICD-10-CM

## 2022-08-30 DIAGNOSIS — C169 Malignant neoplasm of stomach, unspecified: Secondary | ICD-10-CM

## 2022-08-31 ENCOUNTER — Other Ambulatory Visit (HOSPITAL_BASED_OUTPATIENT_CLINIC_OR_DEPARTMENT_OTHER): Payer: Self-pay

## 2022-08-31 ENCOUNTER — Other Ambulatory Visit: Payer: Self-pay | Admitting: *Deleted

## 2022-08-31 ENCOUNTER — Encounter: Payer: Self-pay | Admitting: Family

## 2022-08-31 DIAGNOSIS — C163 Malignant neoplasm of pyloric antrum: Secondary | ICD-10-CM

## 2022-08-31 DIAGNOSIS — C169 Malignant neoplasm of stomach, unspecified: Secondary | ICD-10-CM

## 2022-08-31 MED ORDER — HYDROCODONE-ACETAMINOPHEN 5-325 MG PO TABS: 1.0000 | ORAL_TABLET | Freq: Four times a day (QID) | ORAL | 0 refills | Status: DC | PRN

## 2022-09-09 ENCOUNTER — Encounter: Payer: 59 | Admitting: Gastroenterology

## 2022-09-28 ENCOUNTER — Other Ambulatory Visit: Payer: Self-pay

## 2022-09-28 ENCOUNTER — Other Ambulatory Visit (HOSPITAL_BASED_OUTPATIENT_CLINIC_OR_DEPARTMENT_OTHER): Payer: Self-pay

## 2022-09-28 DIAGNOSIS — C169 Malignant neoplasm of stomach, unspecified: Secondary | ICD-10-CM

## 2022-09-28 DIAGNOSIS — T451X5A Adverse effect of antineoplastic and immunosuppressive drugs, initial encounter: Secondary | ICD-10-CM

## 2022-09-28 DIAGNOSIS — C163 Malignant neoplasm of pyloric antrum: Secondary | ICD-10-CM

## 2022-09-28 MED ORDER — HYDROCODONE-ACETAMINOPHEN 5-325 MG PO TABS
1.0000 | ORAL_TABLET | Freq: Four times a day (QID) | ORAL | 0 refills | Status: DC | PRN
  Filled 2022-09-28: qty 60, 15d supply, fill #0

## 2022-09-28 MED ORDER — PROMETHAZINE HCL 25 MG PO TABS
25.0000 mg | ORAL_TABLET | Freq: Two times a day (BID) | ORAL | 3 refills | Status: AC
Start: 2022-09-28 — End: ?
  Filled 2022-09-28: qty 60, 30d supply, fill #0
  Filled 2022-11-21: qty 60, 30d supply, fill #1
  Filled 2023-01-05: qty 60, 30d supply, fill #2

## 2022-09-29 ENCOUNTER — Ambulatory Visit: Payer: 59 | Admitting: Urology

## 2022-09-29 ENCOUNTER — Encounter: Payer: Self-pay | Admitting: Urology

## 2022-09-29 VITALS — BP 121/79 | HR 76 | Ht 61.0 in | Wt 135.0 lb

## 2022-09-29 DIAGNOSIS — G8929 Other chronic pain: Secondary | ICD-10-CM | POA: Diagnosis not present

## 2022-09-29 DIAGNOSIS — R109 Unspecified abdominal pain: Secondary | ICD-10-CM | POA: Diagnosis not present

## 2022-09-29 DIAGNOSIS — R351 Nocturia: Secondary | ICD-10-CM | POA: Diagnosis not present

## 2022-09-29 DIAGNOSIS — M545 Low back pain, unspecified: Secondary | ICD-10-CM | POA: Diagnosis not present

## 2022-09-29 NOTE — Progress Notes (Addendum)
Assessment: 1. Chronic bilateral low back pain without sciatica   2. Nocturia   3. Flank pain      Plan: Today I had a long discussion with the patient regarding her complaints as noted above. I told her that I have a strong suspicion that her pain is almost certainly musculoskeletal in nature.  She is very concerned that it could be related to a kidney issue. Will obtain CT stone study to assess  Concerning her nocturia I did discuss with her conservative measures including afternoon and evening fluid restriction and recumbency.  Will contact with CT results.  Chief Complaint: back pain  History of Present Illness:  Paula [name] is a 57 y.o. female who is seen in consultation from Nechama Guard, FNP for evaluation of bilateral low back pain. Patient reports a several month history of low back discomfort left more prominent than right.  It typically bothers her most at night when she goes to bed and she uses a heating pad which seems to help.  He does have a positional component as well.  Patient denies any colicky type pain.  She also denies any significant voiding complaints other than getting up at night.  She typically gets up to sometimes 3 times at night.  This is relatively new development over the last 2 years.  No prior GU history. On review of recent laboratory studies the patient had a urine culture 09/21/2022 that was negative. She has normal renal function.  Most recent creatinine 07/2022 = 0.91   Past Medical History:  Past Medical History:  Diagnosis Date   Anxiety    per pt   Arthritis    back   Barrett esophagus    Costochondritis    Esophageal reflux    On omeprazole   Family history of cancer    Headache(784.0) 2011   Migraines   Heart murmur    never caused any problems   History of hiatal hernia    History of radiation therapy 05/12/14- 06/17/14   post-op gastric region/ nodes/ 45 Gy in 25 fractions.    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 [id number]   Wears partial dentures    upper dentures    Past Surgical History:  Past Surgical History:  Procedure Laterality Date   CESAREAN SECTION     x 2   ESOPHAGOGASTRODUODENOSCOPY N/A 09/16/2013   Procedure: ESOPHAGOGASTRODUODENOSCOPY (EGD);  Surgeon: Graylin Shiver, MD;  Location: [facility];  Service: Endoscopy;  Laterality: N/A;   ESOPHAGOGASTRODUODENOSCOPY  12/09/2019   [facility]   GASTROSTOMY     r/t stomach cancer   IR CV LINE INJECTION  08/30/2017   IR FLUORO GUIDE PORT INSERTION RIGHT  03/21/2017   IR US GUIDE VASC ACCESS RIGHT  03/21/2017   LAPAROSCOPY N/A 09/20/2013   Procedure: DIAGNOSTIC LAPAROSCOPY,DISTAL GASTRECTOMY AND FEEDING GASTROJEJUNOSTOMY;  Surgeon: Almond Lint, MD;  Location: [facility];  Service: General;  Laterality: N/A;   OOPHORECTOMY     Around 1985 left ovary removal   PORTACATH PLACEMENT Left 11/18/2013   Procedure: INSERTION PORT-A-CATH;  Surgeon: Almond Lint, MD;  Location: [facility];  Service: General;  Laterality: Left;   TONSILLECTOMY     Around 1994   TUBAL LIGATION     UNILATERAL SALPINGECTOMY Left 11/09/2017   Procedure: UNILATERAL SALPINGECTOMY;  Surgeon: Tereso Newcomer, MD;  Location: [facility];  Service: Gynecology;  Laterality: Left;   UPPER GASTROINTESTINAL ENDOSCOPY     VAGINAL HYSTERECTOMY Left 11/09/2017   Procedure: HYSTERECTOMY VAGINAL WITH LEFT SALPINGECTOMY;  Surgeon: Tereso Newcomer, MD;  Location: [facility];  Service: Gynecology;  Laterality: Left;    Allergies:  Allergies  Allergen Reactions   Tape Itching    Ok with adhesive and paper tape     Family History:  Family History  Problem Relation Age of Onset   Hypertension Mother    Diabetes  Mellitus II Mother    Other Mother        hx of hysterectomy for unspecified reason   Hypertension Sister    Hypertension Brother    Heart failure Maternal Grandmother    Heart Problems Maternal Grandmother    Hypertension Maternal Grandfather    Diabetes Maternal Grandfather    Colon cancer Paternal Uncle        dx. late 50s-early 60s   Gastric cancer Cousin 106   Colon cancer Cousin        dx. early-late 89s; (father also had colon cancer)   Colon cancer Paternal Uncle        dx. late 50s-early 60s   Lung cancer Paternal Uncle        dx. 62s; smoker   Colon polyps Paternal Uncle        unknown number   Heart attack Maternal Aunt    Heart Problems Paternal Aunt    Heart attack Paternal Grandfather    Colon cancer Paternal Grandfather        dx. 10s-60s   Colon polyps Brother        1 maternal half-brother had approx 3 polyps   Breast cancer Cousin 72   Breast cancer Cousin        dx. early 21s or earlier (father had colon cancer)   Lung cancer Other        PGF's brothers (x4); dx. later in life; lim info   Esophageal cancer Neg Hx    Rectal cancer Neg Hx     Social History:  Social History   Tobacco Use   Smoking status: Former    Current packs/day: 0.00    Average packs/day: 1 pack/day for 16.0 years (16.0 ttl pk-yrs)    Types: Cigarettes    Start date: 02/21/1982    Quit date: 02/21/1998    Years since quitting: 24.6   Smokeless tobacco: Never   Tobacco comments:    0.5-1.0 ppd for 16 yrs  Vaping Use   Vaping status: Never Used  Substance Use Topics   Alcohol use: Not Currently   Drug use: Not Currently    Review of symptoms:  Constitutional:  Negative for unexplained weight loss, night sweats, fever, chills ENT:  Negative for nose bleeds, sinus pain, painful swallowing CV:  Negative for chest pain, shortness of breath, exercise intolerance, palpitations, loss of consciousness Resp:  Negative for cough, wheezing, shortness of breath GI:  Negative for  nausea, vomiting, diarrhea, bloody stools GU:  Positives noted in HPI; otherwise negative for gross hematuria, dysuria, urinary incontinence Neuro:  Negative for seizures, poor balance, limb weakness, slurred speech Psych:  Negative for lack of energy, depression, anxiety Endocrine:  Negative for polydipsia, polyuria, symptoms  of hypoglycemia (dizziness, hunger, sweating) Hematologic:  Negative for anemia, purpura, petechia, prolonged or excessive bleeding, use of anticoagulants  Allergic:  Negative for difficulty breathing or choking as a result of exposure to anything; no shellfish allergy; no allergic response (rash/itch) to materials, foods  Physical exam: BP 121/79   Pulse 76   Ht 5\' 1"  (1.549 m)   Wt 135 lb (61.2 kg)   LMP 02/28/2016 (Exact Date)   BMI 25.51 kg/m  GENERAL APPEARANCE:  Well appearing, well developed, well nourished, NAD   Results: UA neg

## 2022-10-08 ENCOUNTER — Ambulatory Visit (HOSPITAL_BASED_OUTPATIENT_CLINIC_OR_DEPARTMENT_OTHER): Admission: RE | Admit: 2022-10-08 | Payer: 59 | Source: Ambulatory Visit

## 2022-10-08 DIAGNOSIS — Z85028 Personal history of other malignant neoplasm of stomach: Secondary | ICD-10-CM | POA: Diagnosis not present

## 2022-10-08 DIAGNOSIS — M545 Low back pain, unspecified: Secondary | ICD-10-CM | POA: Insufficient documentation

## 2022-10-08 DIAGNOSIS — K5730 Diverticulosis of large intestine without perforation or abscess without bleeding: Secondary | ICD-10-CM | POA: Diagnosis not present

## 2022-10-08 DIAGNOSIS — R109 Unspecified abdominal pain: Secondary | ICD-10-CM | POA: Insufficient documentation

## 2022-10-08 DIAGNOSIS — G8929 Other chronic pain: Secondary | ICD-10-CM | POA: Diagnosis present

## 2022-10-08 DIAGNOSIS — K8689 Other specified diseases of pancreas: Secondary | ICD-10-CM | POA: Insufficient documentation

## 2022-10-08 DIAGNOSIS — M5136 Other intervertebral disc degeneration, lumbar region: Secondary | ICD-10-CM | POA: Insufficient documentation

## 2022-10-08 DIAGNOSIS — I700 Atherosclerosis of aorta: Secondary | ICD-10-CM | POA: Insufficient documentation

## 2022-10-08 DIAGNOSIS — C187 Malignant neoplasm of sigmoid colon: Secondary | ICD-10-CM | POA: Insufficient documentation

## 2022-10-19 ENCOUNTER — Inpatient Hospital Stay: Payer: 59

## 2022-10-19 ENCOUNTER — Inpatient Hospital Stay: Payer: 59 | Admitting: Family

## 2022-10-19 ENCOUNTER — Inpatient Hospital Stay: Payer: 59 | Attending: Hematology & Oncology

## 2022-10-19 ENCOUNTER — Encounter: Payer: Self-pay | Admitting: Family

## 2022-10-19 ENCOUNTER — Telehealth: Payer: Self-pay | Admitting: Urology

## 2022-10-19 VITALS — BP 115/77 | HR 78 | Temp 97.5°F | Resp 17 | Wt 136.0 lb

## 2022-10-19 DIAGNOSIS — Z923 Personal history of irradiation: Secondary | ICD-10-CM | POA: Diagnosis not present

## 2022-10-19 DIAGNOSIS — C169 Malignant neoplasm of stomach, unspecified: Secondary | ICD-10-CM

## 2022-10-19 DIAGNOSIS — Z85028 Personal history of other malignant neoplasm of stomach: Secondary | ICD-10-CM | POA: Insufficient documentation

## 2022-10-19 DIAGNOSIS — Z95828 Presence of other vascular implants and grafts: Secondary | ICD-10-CM

## 2022-10-19 DIAGNOSIS — D509 Iron deficiency anemia, unspecified: Secondary | ICD-10-CM | POA: Insufficient documentation

## 2022-10-19 DIAGNOSIS — D500 Iron deficiency anemia secondary to blood loss (chronic): Secondary | ICD-10-CM

## 2022-10-19 LAB — CMP (CANCER CENTER ONLY)
ALT: 11 U/L (ref 0–44)
AST: 17 U/L (ref 15–41)
Albumin: 3.9 g/dL (ref 3.5–5.0)
Alkaline Phosphatase: 72 U/L (ref 38–126)
Anion gap: 6 (ref 5–15)
BUN: 11 mg/dL (ref 6–20)
CO2: 32 mmol/L (ref 22–32)
Calcium: 9.3 mg/dL (ref 8.9–10.3)
Chloride: 102 mmol/L (ref 98–111)
Creatinine: 0.84 mg/dL (ref 0.44–1.00)
GFR, Estimated: >60 mL/min (ref >60)
Glucose, Bld: 122 mg/dL — ABNORMAL HIGH (ref 70–99)
Potassium: 3.9 mmol/L (ref 3.5–5.1)
Sodium: 140 mmol/L (ref 135–145)
Total Bilirubin: 0.4 mg/dL (ref 0.3–1.2)
Total Protein: 6.5 g/dL (ref 6.5–8.1)

## 2022-10-19 LAB — CBC WITH DIFFERENTIAL (CANCER CENTER ONLY)
Abs Immature Granulocytes: 0.01 10*3/uL (ref 0.00–0.07)
Basophils Absolute: 0 10*3/uL (ref 0.0–0.1)
Basophils Relative: 0 %
Eosinophils Absolute: 0.1 10*3/uL (ref 0.0–0.5)
Eosinophils Relative: 2 %
HCT: 34 % — ABNORMAL LOW (ref 36.0–46.0)
Hemoglobin: 10.5 g/dL — ABNORMAL LOW (ref 12.0–15.0)
Immature Granulocytes: 0 %
Lymphocytes Relative: 41 %
Lymphs Abs: 1.9 10*3/uL (ref 0.7–4.0)
MCH: 28.2 pg (ref 26.0–34.0)
MCHC: 30.9 g/dL (ref 30.0–36.0)
MCV: 91.2 fL (ref 80.0–100.0)
Monocytes Absolute: 0.4 10*3/uL (ref 0.1–1.0)
Monocytes Relative: 8 %
Neutro Abs: 2.2 10*3/uL (ref 1.7–7.7)
Neutrophils Relative %: 49 %
Platelet Count: 236 10*3/uL (ref 150–400)
RBC: 3.73 MIL/uL — ABNORMAL LOW (ref 3.87–5.11)
RDW: 14 % (ref 11.5–15.5)
WBC Count: 4.5 10*3/uL (ref 4.0–10.5)
nRBC: 0 % (ref 0.0–0.2)

## 2022-10-19 LAB — RETICULOCYTES
Immature Retic Fract: 13 % (ref 2.3–15.9)
RBC.: 3.71 MIL/uL — ABNORMAL LOW (ref 3.87–5.11)
Retic Count, Absolute: 59 10*3/uL (ref 19.0–186.0)
Retic Ct Pct: 1.6 % (ref 0.4–3.1)

## 2022-10-19 LAB — LACTATE DEHYDROGENASE: LDH: 151 U/L (ref 98–192)

## 2022-10-19 LAB — FERRITIN: Ferritin: 58 ng/mL (ref 11–307)

## 2022-10-19 MED ORDER — SODIUM CHLORIDE 0.9% FLUSH
3.0000 mL | Freq: Once | INTRAVENOUS | Status: AC | PRN
  Administered 2022-10-19: 3 mL

## 2022-10-19 MED ORDER — HEPARIN SOD (PORK) LOCK FLUSH 100 UNIT/ML IV SOLN
250.0000 [IU] | Freq: Once | INTRAVENOUS | Status: AC | PRN
  Administered 2022-10-19: 500 [IU]

## 2022-10-19 NOTE — Progress Notes (Signed)
Hematology and Oncology Follow Up Visit  Paula [name] [id number] [birth year] 57 y.o. 10/19/2022   Principle Diagnosis:  Stage IIA (T1a, N2, M0) gastric adenocarcinoma diagnosed in July of 2015 Intermittent iron deficiency anemia   Past Therapy: Status post diagnostic laparoscopy, distal gastrectomy with billroth II anastamosis and feeding gastrojejunostomy tube under the care of Dr. Donell Beers on 09/20/2013 Adjuvant systemic chemotherapy with 5-FU 600 mg/M2 with leucovorin on days 1, 8, 15, 22, 29 and 36 every 8 weeks Adjuvant concurrent chemoradiation with Xeloda 650 mg/M2 by mouth twice a day for 5 days every week with radiation. The patient completed the course of radiotherapy but she declined to take Xeloda during her radiation   Current Therapy:        Observation IV iron as indicated   Interim History:  Paula [name] is here today for follow-up. She is doing well but has started having n/v at night again. She will try not eating after 6 PM and sleeping on an incline.  No blood loss noted. No bruising or petechiae.  No fever, chills, n/v, cough, rash, dizziness, SOB, chest pain, palpitations, abdominal pain or changes in bowel habits.  She states that she has had lower back pain and dark urine so her urologist has ordered imaging of her kidneys to rule out stones.  No swelling, tenderness, numbness or tingling in her extremities.  No falls or syncope reported.  Appetite is fair but she admits that she needs to better hydrate throughout the day. Weight is 136 lbs.   ECOG Performance Status: 1 - Symptomatic but completely ambulatory  Medications:  Allergies as of 10/19/2022       Reactions   Tape Itching   Ok with adhesive and paper tape         Medication List        Accurate as of October 19, 2022 10:59 AM. If you have any questions, ask your nurse or doctor.          fluticasone 50 MCG/ACT nasal spray Commonly known as: FLONASE Place 1 spray into both  nostrils daily.   gabapentin 600 MG tablet Commonly known as: NEURONTIN Take 1 tablet (600 mg total) by mouth 2 (two) times daily.   HYDROcodone-acetaminophen 5-325 MG tablet Commonly known as: NORCO/VICODIN Take 1 tablet by mouth every 6 (six) hours as needed for moderate pain.   lidocaine-prilocaine cream Commonly known as: EMLA Apply 1 application. topically as needed.   nitroGLYCERIN 0.4 MG SL tablet Commonly known as: NITROSTAT Place 1 tablet (0.4 mg total) under the tongue every 5 (five) minutes as needed for chest pain.   pantoprazole 40 MG tablet Commonly known as: PROTONIX Take 1 tablet (40 mg total) by mouth 2 (two) times daily.   promethazine 25 MG tablet Commonly known as: PHENERGAN Take 1 tablet (25 mg total) by mouth 2 (two) times daily.   sucralfate 1 GM/10ML suspension Commonly known as: CARAFATE Take 10 mLs (1 g total) by mouth 4 (four) times daily.   WOMENS MULTIVITAMIN PO Take by mouth.        Allergies:  Allergies  Allergen Reactions   Tape Itching    Ok with adhesive and paper tape     Past Medical History, Surgical history, Social history, and Family History were reviewed and updated.  Review of Systems: All other 10 point review of systems is negative.   Physical Exam:  weight is 136 lb (61.7 kg). Her oral temperature is 97.5 F (36.4 C) (abnormal).  Her blood pressure is 115/77 and her pulse is 78. Her respiration is 17 and oxygen saturation is 100%.   Wt Readings from Last 3 Encounters:  10/19/22 136 lb (61.7 kg)  09/29/22 135 lb (61.2 kg)  06/15/22 155 lb (70.3 kg)    Ocular: Sclerae unicteric, pupils equal, round and reactive to light Ear-nose-throat: Oropharynx clear, dentition fair Lymphatic: No cervical or supraclavicular adenopathy Lungs no rales or rhonchi, good excursion bilaterally Heart regular rate and rhythm, no murmur appreciated Abd soft, nontender, positive bowel sounds MSK no focal spinal tenderness, no joint  edema Neuro: non-focal, well-oriented, appropriate affect Breasts: Deferred   Lab Results  Component Value Date   WBC 5.1 08/10/2022   HGB 10.3 (L) 08/10/2022   HCT 33.2 (L) 08/10/2022   MCV 90.0 08/10/2022   PLT 236 08/10/2022   Lab Results  Component Value Date   FERRITIN 59 08/10/2022   IRON 65 08/10/2022   TIBC 267 08/10/2022   UIBC 202 08/10/2022   IRONPCTSAT 24 08/10/2022   Lab Results  Component Value Date   RETICCTPCT 1.2 04/20/2022   RBC 3.69 (L) 08/10/2022   No results found for: "KPAFRELGTCHN", "LAMBDASER", "KAPLAMBRATIO" Lab Results  Component Value Date   IGGSERUM 1,273 05/21/2015   IGMSERUM 40 05/21/2015   No results found for: "TOTALPROTELP", "ALBUMINELP", "A1GS", "A2GS", "BETS", "BETA2SER", "GAMS", "MSPIKE", "SPEI"   Chemistry      Component Value Date/Time   NA 138 08/10/2022 1250   NA 140 12/30/2020 1506   NA 140 01/26/2017 1002   NA 140 09/24/2015 0953   K 4.3 08/10/2022 1250   K 3.5 01/26/2017 1002   K 4.0 09/24/2015 0953   CL 102 08/10/2022 1250   CL 106 01/26/2017 1002   CO2 32 08/10/2022 1250   CO2 26 01/26/2017 1002   CO2 23 09/24/2015 0953   BUN 18 08/10/2022 1250   BUN 14 12/30/2020 1506   BUN 6 (L) 01/26/2017 1002   BUN 8.6 09/24/2015 0953   CREATININE 0.91 08/10/2022 1250   CREATININE 1.1 01/26/2017 1002   CREATININE 1.0 09/24/2015 0953      Component Value Date/Time   CALCIUM 9.2 08/10/2022 1250   CALCIUM 8.9 01/26/2017 1002   CALCIUM 9.6 09/24/2015 0953   ALKPHOS 69 08/10/2022 1250   ALKPHOS 68 01/26/2017 1002   ALKPHOS 99 09/24/2015 0953   AST 16 08/10/2022 1250   AST 17 09/24/2015 0953   ALT 15 08/10/2022 1250   ALT 17 01/26/2017 1002   ALT 11 09/24/2015 0953   BILITOT 0.4 08/10/2022 1250   BILITOT 0.83 09/24/2015 0953       Impression and Plan: Paula [name] is a pleasant 57 yo African American female with history of stage II gastric cancer and partial gastrectomy followed by adjuvant chemo and  radiation. Iron studies are pending. We will replace if needed.  Port flush every 2 month, follow-up in 6 months.   Eileen Stanford, NP 8/28/202410:59 AM

## 2022-10-19 NOTE — Patient Instructions (Signed)

## 2022-10-19 NOTE — Telephone Encounter (Signed)
 Returning christals call.

## 2022-10-20 LAB — IRON AND IRON BINDING CAPACITY (CC-WL,HP ONLY)
Iron: 51 ug/dL (ref 28–170)
Saturation Ratios: 18 % (ref 10.4–31.8)
TIBC: 283 ug/dL (ref 250–450)
UIBC: 232 ug/dL

## 2022-10-28 ENCOUNTER — Other Ambulatory Visit: Payer: Self-pay | Admitting: *Deleted

## 2022-10-28 ENCOUNTER — Other Ambulatory Visit: Payer: Self-pay | Admitting: Hematology & Oncology

## 2022-10-28 ENCOUNTER — Other Ambulatory Visit (HOSPITAL_BASED_OUTPATIENT_CLINIC_OR_DEPARTMENT_OTHER): Payer: Self-pay

## 2022-10-28 DIAGNOSIS — C163 Malignant neoplasm of pyloric antrum: Secondary | ICD-10-CM

## 2022-10-28 DIAGNOSIS — C169 Malignant neoplasm of stomach, unspecified: Secondary | ICD-10-CM

## 2022-10-28 MED ORDER — HYDROCODONE-ACETAMINOPHEN 5-325 MG PO TABS: 1.0000 | ORAL_TABLET | Freq: Four times a day (QID) | ORAL | 0 refills | Status: DC | PRN

## 2022-11-08 ENCOUNTER — Other Ambulatory Visit (HOSPITAL_BASED_OUTPATIENT_CLINIC_OR_DEPARTMENT_OTHER): Payer: Self-pay

## 2022-11-11 LAB — HM MAMMOGRAPHY

## 2022-11-17 ENCOUNTER — Telehealth: Payer: Self-pay | Admitting: Family Medicine

## 2022-11-17 NOTE — Telephone Encounter (Signed)
Patient is trying to reach a RN or MD, been taking Gabapentin 5 a day. York Spaniel this is more than prescribed because her Hot Flashes are getting worse. Requesting an increase in her medication.

## 2022-11-18 NOTE — Telephone Encounter (Signed)
I called patient and left a message I am calling her back to discuss her concern and she may call back by 12 today or Monday as we close for the weekend. Paula [name]

## 2022-11-21 ENCOUNTER — Encounter: Payer: Self-pay | Admitting: Obstetrics & Gynecology

## 2022-11-21 ENCOUNTER — Encounter: Payer: Self-pay | Admitting: Family

## 2022-11-21 ENCOUNTER — Other Ambulatory Visit (HOSPITAL_BASED_OUTPATIENT_CLINIC_OR_DEPARTMENT_OTHER): Payer: Self-pay

## 2022-11-21 ENCOUNTER — Other Ambulatory Visit (HOSPITAL_COMMUNITY): Payer: Self-pay

## 2022-11-21 DIAGNOSIS — N951 Menopausal and female climacteric states: Secondary | ICD-10-CM

## 2022-11-21 MED ORDER — VENLAFAXINE HCL ER 75 MG PO CP24
75.0000 mg | ORAL_CAPSULE | Freq: Every day | ORAL | 3 refills | Status: DC
Start: 2022-11-21 — End: 2023-01-15
  Filled 2022-11-21: qty 30, 30d supply, fill #0

## 2022-11-21 MED ORDER — GABAPENTIN 600 MG PO TABS
600.0000 mg | ORAL_TABLET | Freq: Two times a day (BID) | ORAL | 5 refills | Status: DC
  Filled 2022-11-21 (×2): qty 60, 30d supply, fill #0

## 2022-11-21 NOTE — Telephone Encounter (Signed)
Patient would like to receive a call about her medication.

## 2022-11-23 ENCOUNTER — Encounter: Payer: Self-pay | Admitting: Hematology & Oncology

## 2022-11-24 ENCOUNTER — Encounter: Payer: Self-pay | Admitting: Family

## 2022-11-29 ENCOUNTER — Other Ambulatory Visit: Payer: Self-pay

## 2022-11-29 DIAGNOSIS — C163 Malignant neoplasm of pyloric antrum: Secondary | ICD-10-CM

## 2022-11-29 DIAGNOSIS — C169 Malignant neoplasm of stomach, unspecified: Secondary | ICD-10-CM

## 2022-11-30 ENCOUNTER — Ambulatory Visit (AMBULATORY_SURGERY_CENTER): Payer: 59

## 2022-11-30 ENCOUNTER — Encounter: Payer: Self-pay | Admitting: Family

## 2022-11-30 VITALS — Ht 61.0 in | Wt 133.0 lb

## 2022-11-30 DIAGNOSIS — Z1211 Encounter for screening for malignant neoplasm of colon: Secondary | ICD-10-CM

## 2022-11-30 MED ORDER — NA SULFATE-K SULFATE-MG SULF 17.5-3.13-1.6 GM/177ML PO SOLN: 1.0000 | Freq: Once | ORAL | 0 refills | Status: AC

## 2022-11-30 MED ORDER — HYDROCODONE-ACETAMINOPHEN 5-325 MG PO TABS: 1.0000 | ORAL_TABLET | Freq: Four times a day (QID) | ORAL | 0 refills | Status: DC | PRN

## 2022-11-30 NOTE — Progress Notes (Signed)
No egg or soy allergy known to patient  No issues known to pt with past sedation with any surgeries or procedures Patient denies ever being told they had issues or difficulty with intubation  No FH of Malignant Hyperthermia Pt is not on diet pills Pt is not on  home 02  Pt is not on blood thinners  Pt denies issues with constipation  No A fib or A flutter Have any cardiac testing pending--no Patient's chart reviewed by Cathlyn Parsons CNRA prior to previsit and patient appropriate for the [facility].  Previsit completed and red dot placed by patient's name on their procedure day (on provider's schedule).    Pt instructed to use Single[employer]com or GoodRx for a price reduction on prep  Ambulates independently

## 2022-12-16 ENCOUNTER — Other Ambulatory Visit: Payer: 59

## 2022-12-17 ENCOUNTER — Other Ambulatory Visit: Payer: Self-pay | Admitting: Obstetrics and Gynecology

## 2022-12-17 DIAGNOSIS — N951 Menopausal and female climacteric states: Secondary | ICD-10-CM

## 2022-12-17 MED ORDER — GABAPENTIN 600 MG PO TABS
600.0000 mg | ORAL_TABLET | Freq: Two times a day (BID) | ORAL | 5 refills | Status: DC
Start: 2022-12-17 — End: 2023-05-03

## 2022-12-17 NOTE — Addendum Note (Signed)
Addended by: Jaynie Collins A on: 12/17/2022 11:30 AM   Modules accepted: Orders

## 2022-12-19 ENCOUNTER — Other Ambulatory Visit: Payer: 59

## 2022-12-21 ENCOUNTER — Other Ambulatory Visit: Payer: 59

## 2022-12-21 ENCOUNTER — Inpatient Hospital Stay: Payer: 59 | Attending: Hematology & Oncology

## 2022-12-22 ENCOUNTER — Encounter: Payer: Self-pay | Admitting: Hematology & Oncology

## 2022-12-22 ENCOUNTER — Other Ambulatory Visit: Payer: Self-pay

## 2022-12-22 ENCOUNTER — Inpatient Hospital Stay: Payer: 59

## 2022-12-22 ENCOUNTER — Inpatient Hospital Stay: Payer: 59 | Attending: Hematology & Oncology

## 2022-12-22 ENCOUNTER — Other Ambulatory Visit (HOSPITAL_BASED_OUTPATIENT_CLINIC_OR_DEPARTMENT_OTHER): Payer: Self-pay

## 2022-12-22 DIAGNOSIS — D500 Iron deficiency anemia secondary to blood loss (chronic): Secondary | ICD-10-CM

## 2022-12-22 DIAGNOSIS — Z95828 Presence of other vascular implants and grafts: Secondary | ICD-10-CM

## 2022-12-22 DIAGNOSIS — Z85028 Personal history of other malignant neoplasm of stomach: Secondary | ICD-10-CM | POA: Diagnosis not present

## 2022-12-22 DIAGNOSIS — C169 Malignant neoplasm of stomach, unspecified: Secondary | ICD-10-CM

## 2022-12-22 DIAGNOSIS — Z9221 Personal history of antineoplastic chemotherapy: Secondary | ICD-10-CM | POA: Diagnosis not present

## 2022-12-22 DIAGNOSIS — D509 Iron deficiency anemia, unspecified: Secondary | ICD-10-CM | POA: Insufficient documentation

## 2022-12-22 DIAGNOSIS — Z923 Personal history of irradiation: Secondary | ICD-10-CM | POA: Insufficient documentation

## 2022-12-22 LAB — CMP (CANCER CENTER ONLY)
ALT: 11 U/L (ref 0–44)
AST: 15 U/L (ref 15–41)
Albumin: 4.2 g/dL (ref 3.5–5.0)
Alkaline Phosphatase: 72 U/L (ref 38–126)
Anion gap: 4 — ABNORMAL LOW (ref 5–15)
BUN: 12 mg/dL (ref 6–20)
CO2: 33 mmol/L — ABNORMAL HIGH (ref 22–32)
Calcium: 9 mg/dL (ref 8.9–10.3)
Chloride: 103 mmol/L (ref 98–111)
Creatinine: 1.06 mg/dL — ABNORMAL HIGH (ref 0.44–1.00)
GFR, Estimated: >60 mL/min (ref >60)
Glucose, Bld: 93 mg/dL (ref 70–99)
Potassium: 4.1 mmol/L (ref 3.5–5.1)
Sodium: 140 mmol/L (ref 135–145)
Total Bilirubin: 0.5 mg/dL (ref 0.3–1.2)
Total Protein: 6.9 g/dL (ref 6.5–8.1)

## 2022-12-22 LAB — CBC WITH DIFFERENTIAL (CANCER CENTER ONLY)
Abs Immature Granulocytes: 0.01 10*3/uL (ref 0.00–0.07)
Basophils Absolute: 0 10*3/uL (ref 0.0–0.1)
Basophils Relative: 1 %
Eosinophils Absolute: 0.2 10*3/uL (ref 0.0–0.5)
Eosinophils Relative: 3 %
HCT: 33.6 % — ABNORMAL LOW (ref 36.0–46.0)
Hemoglobin: 10.7 g/dL — ABNORMAL LOW (ref 12.0–15.0)
Immature Granulocytes: 0 %
Lymphocytes Relative: 44 %
Lymphs Abs: 2.4 10*3/uL (ref 0.7–4.0)
MCH: 28.2 pg (ref 26.0–34.0)
MCHC: 31.8 g/dL (ref 30.0–36.0)
MCV: 88.7 fL (ref 80.0–100.0)
Monocytes Absolute: 0.5 10*3/uL (ref 0.1–1.0)
Monocytes Relative: 9 %
Neutro Abs: 2.3 10*3/uL (ref 1.7–7.7)
Neutrophils Relative %: 43 %
Platelet Count: 284 10*3/uL (ref 150–400)
RBC: 3.79 MIL/uL — ABNORMAL LOW (ref 3.87–5.11)
RDW: 13.6 % (ref 11.5–15.5)
WBC Count: 5.4 10*3/uL (ref 4.0–10.5)
nRBC: 0 % (ref 0.0–0.2)

## 2022-12-22 LAB — RETICULOCYTES
Immature Retic Fract: 6.7 % (ref 2.3–15.9)
RBC.: 3.81 MIL/uL — ABNORMAL LOW (ref 3.87–5.11)
Retic Count, Absolute: 48 10*3/uL (ref 19.0–186.0)
Retic Ct Pct: 1.3 % (ref 0.4–3.1)

## 2022-12-22 LAB — IRON AND IRON BINDING CAPACITY (CC-WL,HP ONLY)
Iron: 57 ug/dL (ref 28–170)
Saturation Ratios: 22 % (ref 10.4–31.8)
TIBC: 259 ug/dL (ref 250–450)
UIBC: 202 ug/dL (ref 148–442)

## 2022-12-22 LAB — LACTATE DEHYDROGENASE: LDH: 133 U/L (ref 98–192)

## 2022-12-22 LAB — FERRITIN: Ferritin: 71 ng/mL (ref 11–307)

## 2022-12-22 MED ORDER — LIDOCAINE-PRILOCAINE 2.5-2.5 % EX CREA
1.0000 | TOPICAL_CREAM | CUTANEOUS | 6 refills | Status: DC | PRN
  Filled 2022-12-22 – 2023-01-06 (×2): qty 30, 30d supply, fill #0
  Filled 2023-11-30: qty 30, 30d supply, fill #1

## 2022-12-22 MED ORDER — SODIUM CHLORIDE 0.9 % IV SOLN
Freq: Once | INTRAVENOUS | Status: DC
Start: 2022-12-22 — End: 2022-12-22

## 2022-12-22 MED ORDER — HEPARIN SOD (PORK) LOCK FLUSH 100 UNIT/ML IV SOLN
250.0000 [IU] | Freq: Once | INTRAVENOUS | Status: AC | PRN
  Administered 2022-12-22: 500 [IU]

## 2022-12-22 MED ORDER — FLULAVAL 0.5 ML IM SUSY
0.5000 mL | PREFILLED_SYRINGE | Freq: Once | INTRAMUSCULAR | 0 refills | Status: AC
  Filled 2022-12-22: qty 0.5, 1d supply, fill #0

## 2022-12-22 MED ORDER — SODIUM CHLORIDE 0.9% FLUSH
3.0000 mL | Freq: Once | INTRAVENOUS | Status: AC | PRN
  Administered 2022-12-22: 10 mL

## 2022-12-22 NOTE — Patient Instructions (Signed)

## 2022-12-23 ENCOUNTER — Inpatient Hospital Stay: Payer: 59 | Attending: Hematology & Oncology

## 2022-12-23 ENCOUNTER — Other Ambulatory Visit: Payer: 59

## 2022-12-23 ENCOUNTER — Inpatient Hospital Stay: Payer: 59

## 2022-12-30 ENCOUNTER — Encounter: Payer: 59 | Admitting: Gastroenterology

## 2022-12-30 ENCOUNTER — Other Ambulatory Visit: Payer: Self-pay | Admitting: *Deleted

## 2022-12-30 DIAGNOSIS — C169 Malignant neoplasm of stomach, unspecified: Secondary | ICD-10-CM

## 2022-12-30 DIAGNOSIS — C163 Malignant neoplasm of pyloric antrum: Secondary | ICD-10-CM

## 2022-12-30 MED ORDER — HYDROCODONE-ACETAMINOPHEN 5-325 MG PO TABS: 1.0000 | ORAL_TABLET | Freq: Four times a day (QID) | ORAL | 0 refills | Status: DC | PRN

## 2023-01-03 ENCOUNTER — Other Ambulatory Visit (HOSPITAL_BASED_OUTPATIENT_CLINIC_OR_DEPARTMENT_OTHER): Payer: Self-pay

## 2023-01-06 ENCOUNTER — Other Ambulatory Visit (HOSPITAL_BASED_OUTPATIENT_CLINIC_OR_DEPARTMENT_OTHER): Payer: Self-pay

## 2023-01-06 MED ORDER — COVID-19 MRNA VAC-TRIS(PFIZER) 30 MCG/0.3ML IM SUSY
0.3000 mL | PREFILLED_SYRINGE | Freq: Once | INTRAMUSCULAR | 0 refills | Status: AC
  Filled 2023-01-06: qty 0.3, 1d supply, fill #0

## 2023-01-15 MED ORDER — VENLAFAXINE HCL ER 75 MG PO CP24
75.0000 mg | ORAL_CAPSULE | Freq: Every day | ORAL | 4 refills | Status: DC
  Filled 2023-01-15: qty 90, 90d supply, fill #0

## 2023-01-15 NOTE — Addendum Note (Signed)
Addended by: Jaynie Collins A on: 01/15/2023 10:40 PM   Modules accepted: Orders

## 2023-01-16 ENCOUNTER — Other Ambulatory Visit (HOSPITAL_BASED_OUTPATIENT_CLINIC_OR_DEPARTMENT_OTHER): Payer: Self-pay

## 2023-01-31 ENCOUNTER — Other Ambulatory Visit: Payer: Self-pay | Admitting: Hematology & Oncology

## 2023-01-31 DIAGNOSIS — C169 Malignant neoplasm of stomach, unspecified: Secondary | ICD-10-CM

## 2023-01-31 DIAGNOSIS — C163 Malignant neoplasm of pyloric antrum: Secondary | ICD-10-CM

## 2023-02-01 ENCOUNTER — Encounter: Payer: Self-pay | Admitting: Family

## 2023-02-01 MED ORDER — HYDROCODONE-ACETAMINOPHEN 5-325 MG PO TABS: 1.0000 | ORAL_TABLET | Freq: Four times a day (QID) | ORAL | 0 refills | Status: DC | PRN

## 2023-02-08 ENCOUNTER — Telehealth: Payer: Self-pay | Admitting: Physician Assistant

## 2023-02-08 NOTE — Telephone Encounter (Signed)
Inbound call from patient returning phone call. Requesting a call back. Please advise, thank you.  

## 2023-02-08 NOTE — Telephone Encounter (Signed)
Inbound call from patient stating that she has been vomiting for the last 2 weeks everyday. Patient states she was given medication to coat her throat awhile back and the medication does not help and her throat is burning. Patient is currently scheduled for a colonoscopy in January with Dr. Chales Abrahams. Patient is requesting a call back to discuss. Please advise.

## 2023-02-08 NOTE — Telephone Encounter (Signed)
Called patient in reference to complaints she is presently having, vomiting everyday for 2 weeks and complaints about her throat burning. Noted in chart that patient is presently taking  Carafate for coating of the throat and Protonix for heartburn. Patient is not available at present, left message to call back.

## 2023-02-09 NOTE — Telephone Encounter (Signed)
Returned patient call in regard to complaints of vomiting 2-3 weeks daily. Patient states she takes the Carafate to coat her throat due to complaints of burning. Patient informed the nurse the Carafate and the Protonix do not work. Patient increased the Protonix medication on her own, taking 2 tabs upon administration, and states there is no change or improvment. States acid reflux remains, as well as the feeling of fullness in her chest when swallowing. Patient also informed the nurse that she has Covid and was treated on 01/26/23. Appt made with Ms. Hyacinth Meeker, PA on 02/16/23. Informed the patient if she notes any SOB, difficulty breathing,or bleeding, to go to the ED. Patient understood and agreed. Please advise.

## 2023-02-16 ENCOUNTER — Ambulatory Visit: Payer: 59 | Admitting: Physician Assistant

## 2023-02-16 ENCOUNTER — Encounter: Payer: Self-pay | Admitting: Physician Assistant

## 2023-02-16 VITALS — BP 144/82 | HR 70 | Ht 61.0 in | Wt 127.0 lb

## 2023-02-16 DIAGNOSIS — Z85028 Personal history of other malignant neoplasm of stomach: Secondary | ICD-10-CM

## 2023-02-16 DIAGNOSIS — R1310 Dysphagia, unspecified: Secondary | ICD-10-CM | POA: Diagnosis not present

## 2023-02-16 DIAGNOSIS — R634 Abnormal weight loss: Secondary | ICD-10-CM | POA: Diagnosis not present

## 2023-02-16 DIAGNOSIS — R112 Nausea with vomiting, unspecified: Secondary | ICD-10-CM | POA: Diagnosis not present

## 2023-02-16 DIAGNOSIS — R1013 Epigastric pain: Secondary | ICD-10-CM

## 2023-02-16 MED ORDER — PANTOPRAZOLE SODIUM 40 MG PO TBEC: 40.0000 mg | DELAYED_RELEASE_TABLET | Freq: Two times a day (BID) | ORAL | 6 refills | Status: AC

## 2023-02-16 NOTE — Progress Notes (Signed)
Chief Complaint: Vomiting and dysphagia  HPI:    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 Chales Abrahams, who presents to clinic today for complaint of vomiting and dysphagia.    01/18/2021 CT chest abdomen pelvis with contrast showed redemonstrated postoperative findings of distal gastrectomy and gastrojejunostomy, no evidence of recurrent or metastatic disease in the chest abdomen or pelvis, unchanged bandlike scarring and volume loss of the right lung base sequela of prior infection or inflammation.    01/20/2022 patient seen in clinic for nausea and vomiting.  At that time discussed history of gastric cancer.  EGD in January 2021 with a 2 cm Barrett's esophagus small gastric remnant with angular takeoff of a Farron/U currently moves, had stopped marijuana and smoking, at that time recommended EGD and Phenergan 25 mg p.o. OD.  Discussed a colonoscopy with patient refused screening.  Continue Pantoprazole 40 p.o. daily.    01/28/2022 EGD with esophageal mucosal changes consistent with short segment Barrett's, patent Billroth II gastrojejunostomy with erosion, erythema and edema, medium out of food residue in the stomach.  At that time increase Pantoprazole to 40 twice daily, small more frequent meals.  Continue IV iron.    04/20/2022 iron studies normal.  CBC with hemoglobin 11.1, improved over the past 3 months.    05/03/2022 patient called our clinic with some blood in her stool.  She was scheduled with me.    05/06/2022 patient seen and clinic with possible blood in her vomit.  Discussed increasing vomiting symptoms which are somewhat chronic for her regardless of Phenergan 25 mg twice daily.  Still on Pantoprazole 40 twice daily but had run out of Carafate.  At that time ordered Hemoccult study and checked a CBC.  Continued on  Pantoprazole 40 twice daily and refilled Carafate.  Discussed increasing Phenergan to 25 mg every 8 hours.    10/08/2022 CT renal stone study with no urinary calculi, disc bulge at L4-5.    12/22/2022 CBC with a hemoglobin around her baseline over the past 6 months at 10.7.  Normal MCV.  CMP with a creatinine of 1.06 and otherwise normal.    02/08/2023 patient called in to report 2 to 3 weeks of daily vomiting.  Still on Carafate.  Also discussed fullness when swallowing related treated for COVID on 01/26/2023.    Today, the patient presents to clinic and tells me that she has been vomiting every day for a month.  Apparently cannot keep anything down including water, though she was able to drink a laxative and keep that down the other day.  Tells me that food just does not stay down, typically last about 30 minutes and then will come back out.  She has lost another 10 pounds or so.  Also had some issues where she felt like food was getting hung up in her throat for a while but has not experienced that over the past week.  In general she is just very worried as this seems to be the same symptoms she was having prior to having stomach cancer diagnosis.  She would like to make sure nothing is going on.  Currently taking Pantoprazole 40 mg 2 tablets in the morning and 2 at night.  Unaware this is an overdose.  Also using Carafate 4 times daily.  Associated symptoms include some epigastric pain.  Denies fever, chills or symptoms that awaken her from sleep.  Past Medical History:  Diagnosis Date   Anxiety    per pt   Arthritis    back   Barrett esophagus    Costochondritis    Esophageal reflux    On omeprazole   Family history of cancer    Headache(784.0) 2011   Migraines   Heart murmur    never caused any problems   History of hiatal hernia    History of radiation therapy 05/12/14- 06/17/14   post-op gastric region/ nodes/ 45 Gy in 25 fractions.    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 [id number]   Wears partial dentures    upper dentures    Past Surgical History:  Procedure Laterality Date   CESAREAN SECTION     x 2   ESOPHAGOGASTRODUODENOSCOPY N/A 09/16/2013   Procedure: ESOPHAGOGASTRODUODENOSCOPY (EGD);  Surgeon: Graylin Shiver, MD;  Location: [facility];  Service: Endoscopy;  Laterality: N/A;   ESOPHAGOGASTRODUODENOSCOPY  12/09/2019   [facility]   GASTROSTOMY     r/t stomach cancer   IR CV LINE INJECTION  08/30/2017   IR FLUORO GUIDE PORT INSERTION RIGHT  03/21/2017   IR US GUIDE VASC ACCESS RIGHT  03/21/2017   LAPAROSCOPY N/A 09/20/2013   Procedure: DIAGNOSTIC LAPAROSCOPY,DISTAL GASTRECTOMY AND FEEDING GASTROJEJUNOSTOMY;  Surgeon: Almond Lint, MD;  Location: [facility];  Service: General;  Laterality: N/A;   OOPHORECTOMY     Around 1985 left ovary removal   PORTACATH PLACEMENT Left 11/18/2013   Procedure: INSERTION PORT-A-CATH;  Surgeon: Almond Lint, MD;  Location: [facility];  Service: General;  Laterality: Left;   TONSILLECTOMY     Around 1994   TUBAL LIGATION     UNILATERAL SALPINGECTOMY Left 11/09/2017   Procedure: UNILATERAL SALPINGECTOMY;  Surgeon: Tereso Newcomer, MD;  Location: [facility];  Service: Gynecology;  Laterality: Left;   UPPER GASTROINTESTINAL ENDOSCOPY     VAGINAL HYSTERECTOMY Left 11/09/2017   Procedure: HYSTERECTOMY VAGINAL WITH LEFT SALPINGECTOMY;  Surgeon: Tereso Newcomer, MD;  Location: [facility];  Service: Gynecology;  Laterality: Left;    Current Outpatient Medications  Medication Sig Dispense Refill   fluticasone (FLONASE) 50 MCG/ACT nasal spray Place 1 spray into both nostrils daily. (Patient not taking: Reported on 11/30/2022) 16 g 2   gabapentin (NEURONTIN) 600 MG tablet Take  1 tablet (600 mg total) by mouth 2 (two) times daily. 120 tablet 5   HYDROcodone-acetaminophen (NORCO/VICODIN) 5-325 MG tablet Take 1 tablet by mouth every 6 (six) hours as needed for moderate pain (pain score 4-6). 60 tablet 0   lidocaine-prilocaine (EMLA) cream Apply topically as needed. 30 g 6   Multiple Vitamins-Minerals (WOMENS MULTIVITAMIN PO) Take by mouth.     nitroGLYCERIN (NITROSTAT) 0.4 MG SL tablet Place 1 tablet (0.4 mg total) under the tongue every 5 (five) minutes as needed for chest pain. (Patient not taking: No sig reported) 25 tablet 2   pantoprazole (PROTONIX) 40 MG tablet Take 1 tablet (40 mg total) by mouth 2 (two) times daily. 60 tablet 6   predniSONE (DELTASONE) 10 MG tablet Take 10 mg by  mouth.     promethazine (PHENERGAN) 25 MG tablet Take 1 tablet (25 mg total) by mouth 2 (two) times daily. 60 tablet 3   sucralfate (CARAFATE) 1 GM/10ML suspension Take 10 mLs (1 g total) by mouth 4 (four) times daily. 1200 mL 0   venlafaxine XR (EFFEXOR-XR) 75 MG 24 hr capsule Take 1 capsule (75 mg total) by mouth daily. 90 capsule 4   Current Facility-Administered Medications  Medication Dose Route Frequency Provider Last Rate Last Admin   alteplase (CATHFLO ACTIVASE) injection 2 mg  2 mg Intracatheter Once Josph Macho, MD       alteplase (CATHFLO ACTIVASE) injection 2 mg  2 mg Intracatheter Once Josph Macho, MD       Facility-Administered Medications Ordered in Other Visits  Medication Dose Route Frequency Provider Last Rate Last Admin   sodium chloride flush (NS) 0.9 % injection 10 mL  10 mL Intravenous PRN Josph Macho, MD   10 mL at 01/03/18 1316   sodium chloride flush (NS) 0.9 % injection 10 mL  10 mL Intravenous PRN Josph Macho, MD   10 mL at 09/10/19 1114   sodium chloride flush (NS) 0.9 % injection 10 mL  10 mL Intravenous PRN Josph Macho, MD   10 mL at 09/17/21 1023    Allergies as of 02/16/2023 - Review Complete 11/30/2022  Allergen Reaction Noted    Tape Itching 03/04/2015    Family History  Problem Relation Age of Onset   Hypertension Mother    Diabetes Mellitus II Mother    Other Mother        hx of hysterectomy for unspecified reason   Hypertension Sister    Hypertension Brother    Heart failure Maternal Grandmother    Heart Problems Maternal Grandmother    Hypertension Maternal Grandfather    Diabetes Maternal Grandfather    Colon cancer Paternal Uncle        dx. late 50s-early 60s   Gastric cancer Cousin 47   Colon cancer Cousin        dx. early-late 67s; (father also had colon cancer)   Colon cancer Paternal Uncle        dx. late 50s-early 60s   Lung cancer Paternal Uncle        dx. 29s; smoker   Colon polyps Paternal Uncle        unknown number   Heart attack Maternal Aunt    Heart Problems Paternal Aunt    Heart attack Paternal Grandfather    Colon cancer Paternal Grandfather        dx. 108s-60s   Colon polyps Brother        1 maternal half-brother had approx 3 polyps   Breast cancer Cousin 61   Breast cancer Cousin        dx. early 78s or earlier (father had colon cancer)   Lung cancer Other        PGF's brothers (x4); dx. later in life; lim info   Esophageal cancer Neg Hx    Rectal cancer Neg Hx     Social History   Socioeconomic History   Marital status: Legally Separated    Spouse name: Dennard Nip   Number of children: 2   Years of education: Not on file   Highest education level: Not on file  Occupational History    Employer: [employer]  Tobacco Use   Smoking status: Former    Current packs/day: 0.00  Average packs/day: 1 pack/day for 16.0 years (16.0 ttl pk-yrs)    Types: Cigarettes    Start date: 02/21/1982    Quit date: 02/21/1998    Years since quitting: 25.0   Smokeless tobacco: Never   Tobacco comments:    0.5-1.0 ppd for 16 yrs  Vaping Use   Vaping status: Never Used  Substance and Sexual Activity   Alcohol use: Not Currently   Drug use: Not Currently   Sexual  activity: Not Currently    Birth control/protection: Surgical  Other Topics Concern   Not on file  Social History Narrative   Not on file   Social Drivers of Health   Financial Resource Strain: Not on File (06/10/2021)   Received from [employer], [employer]    Financial Resource Strain: 0  Food Insecurity: Not on File (11/17/2022)   Received from [facility] Insecurity    Food: 0  Transportation Needs: Not on File (06/10/2021)   Received from [employer] [facility], [employer] Needs    Transportation: 0  Physical Activity: Not on File (06/10/2021)   Received from [place], [place]   Physical Activity    Physical Activity: 0  Stress: Not on File (06/10/2021)   Received from [facility], [place]   Stress    Stress: 0  Social Connections: Not on File (11/05/2022)   Received from [employer]   Social Connections    Connectedness: 0  Intimate Partner Violence: Not on file    Review of Systems:    Constitutional: No fever or chills Cardiovascular: No chest pain Respiratory: No SOB  Gastrointestinal: See HPI and otherwise negative   Physical Exam:  Vital signs: BP (!) 144/82   Pulse 70   Ht 5\' 1"  (1.549 m)   Wt 127 lb (57.6 kg)   LMP 02/28/2016 (Exact Date)   BMI 24.00 kg/m    Constitutional:   Pleasant AA female appears to be in NAD, Well developed, Well nourished, alert and cooperative Respiratory: Respirations even and unlabored. Lungs clear to auscultation bilaterally.   No wheezes, crackles, or rhonchi.  Cardiovascular: Normal S1, S2. No MRG. Regular rate and rhythm. No peripheral edema, cyanosis or pallor.  Gastrointestinal:  Soft, nondistended, mild generalized TTP, some worse in the epigastrium no rebound or guarding. Normal bowel sounds. No appreciable masses or hepatomegaly. Rectal:  Not performed.  Psychiatric: Demonstrates good judgement and reason without abnormal affect or behaviors.  RELEVANT LABS AND IMAGING: CBC    Component Value Date/Time   WBC 5.4  12/22/2022 0840   WBC 7.8 05/06/2022 1202   RBC 3.81 (L) 12/22/2022 0840   RBC 3.79 (L) 12/22/2022 0840   HGB 10.7 (L) 12/22/2022 0840   HGB 10.7 (L) 12/30/2020 1506   HGB 11.6 01/26/2017 1002   HGB 10.9 (L) 05/06/2015 0948   HCT 33.6 (L) 12/22/2022 0840   HCT 34.4 12/30/2020 1506   HCT 36.3 01/26/2017 1002   HCT 33.5 (L) 05/06/2015 0948   PLT 284 12/22/2022 0840   PLT 227 12/30/2020 1506   MCV 88.7 12/22/2022 0840   MCV 90 12/30/2020 1506   MCV 90 01/26/2017 1002   MCV 87.7 05/06/2015 0948   MCH 28.2 12/22/2022 0840   MCHC 31.8 12/22/2022 0840   RDW 13.6 12/22/2022 0840   RDW 13.0 12/30/2020 1506   RDW 13.6 01/26/2017 1002   RDW 15.1 (H) 05/06/2015 0948   LYMPHSABS 2.4 12/22/2022 0840   LYMPHSABS 1.9 12/30/2020 1506   LYMPHSABS 1.5 01/26/2017  1002   LYMPHSABS 0.6 (L) 05/06/2015 0948   MONOABS 0.5 12/22/2022 0840   MONOABS 0.2 05/06/2015 0948   EOSABS 0.2 12/22/2022 0840   EOSABS 0.1 12/30/2020 1506   EOSABS 0.1 01/26/2017 1002   BASOSABS 0.0 12/22/2022 0840   BASOSABS 0.0 12/30/2020 1506   BASOSABS 0.0 01/26/2017 1002   BASOSABS 0.1 05/06/2015 0948    CMP     Component Value Date/Time   NA 140 12/22/2022 0840   NA 140 12/30/2020 1506   NA 140 01/26/2017 1002   NA 140 09/24/2015 0953   K 4.1 12/22/2022 0840   K 3.5 01/26/2017 1002   K 4.0 09/24/2015 0953   CL 103 12/22/2022 0840   CL 106 01/26/2017 1002   CO2 33 (H) 12/22/2022 0840   CO2 26 01/26/2017 1002   CO2 23 09/24/2015 0953   GLUCOSE 93 12/22/2022 0840   GLUCOSE 105 01/26/2017 1002   BUN 12 12/22/2022 0840   BUN 14 12/30/2020 1506   BUN 6 (L) 01/26/2017 1002   BUN 8.6 09/24/2015 0953   CREATININE 1.06 (H) 12/22/2022 0840   CREATININE 1.1 01/26/2017 1002   CREATININE 1.0 09/24/2015 0953   CALCIUM 9.0 12/22/2022 0840   CALCIUM 8.9 01/26/2017 1002   CALCIUM 9.6 09/24/2015 0953   PROT 6.9 12/22/2022 0840   PROT 6.2 12/30/2020 1506   PROT 6.8 01/26/2017 1002   PROT 7.4 09/24/2015 0953   ALBUMIN  4.2 12/22/2022 0840   ALBUMIN 4.2 12/30/2020 1506   ALBUMIN 4.0 08/08/2016 1404   ALBUMIN 4.0 09/24/2015 0953   AST 15 12/22/2022 0840   AST 17 09/24/2015 0953   ALT 11 12/22/2022 0840   ALT 17 01/26/2017 1002   ALT 11 09/24/2015 0953   ALKPHOS 72 12/22/2022 0840   ALKPHOS 68 01/26/2017 1002   ALKPHOS 99 09/24/2015 0953   BILITOT 0.5 12/22/2022 0840   BILITOT 0.83 09/24/2015 0953   GFRNONAA >60 12/22/2022 0840   GFRAA >60 10/17/2019 1956   GFRAA >60 09/10/2019 1030    Assessment: 1.  Nausea and vomiting: Constant over the past month, patient does have chronic vomiting but is typically only every once in a while and now has become more frequent uncontrolled on Phenergan; consider anastomotic ulcer from prior gastric surgery versus gastric outlet obstruction versus other 2.  Weight loss 3.  GERD: Uncontrolled on Pantoprazole 40 mg 2 tabs twice daily as well as Carafate 4.  History of gastric cancer  Plan: 1.  Labs today to include a CBC and CMP 2.  Discussed with patient she needs to only be taking Pantoprazole 40 mg 30-60 minutes before breakfast and then again before dinner.  The dose she is taking currently is an overdose.  Refilled her normal prescription #60 with 5 refills. 3.  Continue Carafate 4.  Scheduled patient for an EGD on Monday, 02/20/2023 for ongoing symptoms, occasional dysphagia and vomiting with history of gastric cancer.  Did provide the patient a detailed list of risks for the procedure and she agrees to proceed. 5.  Continue Phenergan as needed or scheduled every 6 hours 6.  Patient to follow in clinic per recommendations after time of procedure.  Hyacinth Meeker, PA-C [facility] 02/16/2023, 3:05 PM  Cc: Nechama Guard, FNP

## 2023-02-16 NOTE — Patient Instructions (Signed)
We have sent the following medications to your pharmacy for you to pick up at your convenience: Pantoprazole  You have been scheduled for an endoscopy. Please follow written instructions given to you at your visit today.  If you use inhalers (even only as needed), please bring them with you on the day of your procedure.  If you take any of the following medications, they will need to be adjusted prior to your procedure:   DO NOT TAKE 7 DAYS PRIOR TO TEST- Trulicity (dulaglutide) Ozempic, Wegovy (semaglutide) Mounjaro (tirzepatide) Bydureon Bcise (exanatide extended release)  DO NOT TAKE 1 DAY PRIOR TO YOUR TEST Rybelsus (semaglutide) Adlyxin (lixisenatide) Victoza (liraglutide) Byetta (exanatide) _________________________________________________________________  _______________________________________________________  If your blood pressure at your visit was 140/90 or greater, please contact your primary care physician to follow up on this.  _______________________________________________________  If you are age 27 or older, your body mass index should be between 23-30. Your Body mass index is 24 kg/m. If this is out of the aforementioned range listed, please consider follow up with your Primary Care Provider.  If you are age 68 or younger, your body mass index should be between 19-25. Your Body mass index is 24 kg/m. If this is out of the aformentioned range listed, please consider follow up with your Primary Care Provider.   ________________________________________________________  The [facility] providers would like to encourage you to use [facility] to communicate with providers for non-urgent requests or questions.  Due to long hold times on the telephone, sending your provider a message by [facility] may be a faster and more efficient way to get a response.  Please allow 48 business hours for a response.  Please remember that this is for non-urgent requests.   _______________________________________________________

## 2023-02-17 ENCOUNTER — Other Ambulatory Visit (INDEPENDENT_AMBULATORY_CARE_PROVIDER_SITE_OTHER): Payer: 59

## 2023-02-17 ENCOUNTER — Other Ambulatory Visit: Payer: Self-pay | Admitting: *Deleted

## 2023-02-17 DIAGNOSIS — R634 Abnormal weight loss: Secondary | ICD-10-CM | POA: Diagnosis not present

## 2023-02-17 DIAGNOSIS — R112 Nausea with vomiting, unspecified: Secondary | ICD-10-CM | POA: Diagnosis not present

## 2023-02-17 DIAGNOSIS — R1310 Dysphagia, unspecified: Secondary | ICD-10-CM | POA: Diagnosis not present

## 2023-02-17 DIAGNOSIS — R1013 Epigastric pain: Secondary | ICD-10-CM | POA: Diagnosis not present

## 2023-02-17 LAB — COMPREHENSIVE METABOLIC PANEL
ALT: 15 U/L (ref 0–35)
AST: 15 U/L (ref 0–37)
Albumin: 4 g/dL (ref 3.5–5.2)
Alkaline Phosphatase: 85 U/L (ref 39–117)
BUN: 13 mg/dL (ref 6–23)
CO2: 33 meq/L — ABNORMAL HIGH (ref 19–32)
Calcium: 9.2 mg/dL (ref 8.4–10.5)
Chloride: 104 meq/L (ref 96–112)
Creatinine, Ser: 0.91 mg/dL (ref 0.40–1.20)
GFR: 70.69 mL/min (ref >60.00)
Glucose, Bld: 86 mg/dL (ref 70–99)
Potassium: 4.5 meq/L (ref 3.5–5.1)
Sodium: 141 meq/L (ref 135–145)
Total Bilirubin: 0.6 mg/dL (ref 0.2–1.2)
Total Protein: 6.9 g/dL (ref 6.0–8.3)

## 2023-02-17 LAB — CBC WITH DIFFERENTIAL/PLATELET
Basophils Absolute: 0 10*3/uL (ref 0.0–0.1)
Basophils Relative: 0.7 % (ref 0.0–3.0)
Eosinophils Absolute: 0.1 10*3/uL (ref 0.0–0.7)
Eosinophils Relative: 1.1 % (ref 0.0–5.0)
HCT: 37.9 % (ref 36.0–46.0)
Hemoglobin: 12.1 g/dL (ref 12.0–15.0)
Lymphocytes Relative: 30.9 % (ref 12.0–46.0)
Lymphs Abs: 1.6 10*3/uL (ref 0.7–4.0)
MCHC: 31.8 g/dL (ref 30.0–36.0)
MCV: 89.6 fL (ref 78.0–100.0)
Monocytes Absolute: 0.5 10*3/uL (ref 0.1–1.0)
Monocytes Relative: 9.7 % (ref 3.0–12.0)
Neutro Abs: 3 10*3/uL (ref 1.4–7.7)
Neutrophils Relative %: 57.6 % (ref 43.0–77.0)
Platelets: 249 10*3/uL (ref 150.0–400.0)
RBC: 4.24 Mil/uL (ref 3.87–5.11)
RDW: 14.5 % (ref 11.5–15.5)
WBC: 5.2 10*3/uL (ref 4.0–10.5)

## 2023-02-20 ENCOUNTER — Encounter: Payer: Self-pay | Admitting: Gastroenterology

## 2023-02-20 ENCOUNTER — Ambulatory Visit (AMBULATORY_SURGERY_CENTER): Payer: 59 | Admitting: Gastroenterology

## 2023-02-20 VITALS — BP 149/76 | HR 92 | Temp 97.0°F | Resp 18 | Ht 61.0 in | Wt 127.0 lb

## 2023-02-20 DIAGNOSIS — Z980 Intestinal bypass and anastomosis status: Secondary | ICD-10-CM

## 2023-02-20 DIAGNOSIS — K2950 Unspecified chronic gastritis without bleeding: Secondary | ICD-10-CM

## 2023-02-20 DIAGNOSIS — R1310 Dysphagia, unspecified: Secondary | ICD-10-CM

## 2023-02-20 DIAGNOSIS — K449 Diaphragmatic hernia without obstruction or gangrene: Secondary | ICD-10-CM

## 2023-02-20 DIAGNOSIS — B3781 Candidal esophagitis: Secondary | ICD-10-CM

## 2023-02-20 DIAGNOSIS — K219 Gastro-esophageal reflux disease without esophagitis: Secondary | ICD-10-CM

## 2023-02-20 DIAGNOSIS — Z85028 Personal history of other malignant neoplasm of stomach: Secondary | ICD-10-CM

## 2023-02-20 DIAGNOSIS — R1013 Epigastric pain: Secondary | ICD-10-CM

## 2023-02-20 MED ORDER — SODIUM CHLORIDE 0.9 % IV SOLN: 500.0000 mL | Freq: Once | INTRAVENOUS | Status: DC

## 2023-02-20 MED ORDER — FLUCONAZOLE 200 MG PO TABS: ORAL_TABLET | ORAL | 0 refills | Status: AC

## 2023-02-20 MED ORDER — SODIUM CHLORIDE 0.9 % IV SOLN
4.0000 mg | Freq: Once | INTRAVENOUS | Status: AC
  Administered 2023-02-20: 4 mg via INTRAVENOUS

## 2023-02-20 NOTE — Progress Notes (Signed)
1230- after awaking, pt c/o nausea  Vomited approximately 50 mL of brown-tinged emesis.  Order received from Dr. Chales Abrahams- 4 mg of Zofran IV given per Maralyn Sago Monday RN.  1242- pt resting quietly, no further c/o nausea at this time  Per Dr. Chales Abrahams- full liquids today, then regular diet tomorrow

## 2023-02-20 NOTE — Progress Notes (Signed)
To pacu, VSS. Report to Rn,tb

## 2023-02-20 NOTE — Progress Notes (Signed)
Called to room to assist during endoscopic procedure.  Patient ID and intended procedure confirmed with present staff. Received instructions for my participation in the procedure from the performing physician.  

## 2023-02-20 NOTE — Op Note (Addendum)
[facility] Patient Name: Paula [name] Procedure Date: 02/20/2023 11:41 AM MRN: [id number] Endoscopist: Lynann Bologna , MD, [id number] Age: 57 Referring MD:  Date of Birth: 19-Feb-1966 Gender: Female Account #: [id number] Procedure:                Upper GI endoscopy Indications:              Epigastric abdominal pain with N/V/wt loss. H/O                            Stage IIA (T1a, N2, M0) gastric AdenoCa s/p BII                            gastrectomy/feeding J tube July 2015 followed by                            neoadjuvant chemoradiation (Dr Donell Beers). Medicines:                Monitored Anesthesia Care Procedure:                Pre-Anesthesia Assessment:                           - Prior to the procedure, a History and Physical                            was performed, and patient medications and                            allergies were reviewed. The patient's tolerance of                            previous anesthesia was also reviewed. The risks                            and benefits of the procedure and the sedation                            options and risks were discussed with the patient.                            All questions were answered, and informed consent                            was obtained. Prior Anticoagulants: The patient has                            taken no anticoagulant or antiplatelet agents. ASA                            Grade Assessment: III - A patient with severe                            systemic disease. After reviewing the risks and  benefits, the patient was deemed in satisfactory                            condition to undergo the procedure.                           After obtaining informed consent, the endoscope was                            passed under direct vision. Throughout the                            procedure, the patient's blood pressure, pulse, and                            oxygen  saturations were monitored continuously. The                            Olympus Scope [id number] was introduced through the                            mouth, and advanced to the afferent and efferent                            jejunal loops. The upper GI endoscopy was                            accomplished without difficulty. The patient                            tolerated the procedure well. Scope In: Scope Out: Findings:                 Diffuse, yellow plaques were found in the mid                            esophagus and in the distal esophagus. Barrett's                            esophagus could not be evaluated d/t Candida                            esophagitis. Biopsies were taken with a cold                            forceps for histology.                           Small transient HH.                           Evidence of a Billroth II gastrojejunostomy was                            found. 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  The gastrojejunal anastomosis was characterized by                            edema and erosion. No ulcers. There was angular                            takeoff of efferent limb with minor stenosis. A TTS                            dilator was passed through the scope. Dilation with                            a 10 mm pyloric balloon dilator was performed. The                            dilation site was examined and showed moderate                            improvement in luminal narrowing. The afferent limb                            was normal.                           Diffuse mild inflammation characterized by erythema                            was found in the entire gastric remnant. Biopsies                            were taken with a cold forceps for histology from                            the gastric mucosa and anastomosis. No endoscopic                            evidence of recurrence. Complications:             No immediate complications. Estimated Blood Loss:     Estimated blood loss: none. Impression:               - Esophageal plaques were found, consistent with                            candidiasis. Biopsied.                           - Patent Billroth II gastrojejunostomy was found,                            characterized by erosion and edema. Dilated.                           - Gastritis. Biopsied. Recommendation:           - Patient has a  contact number available for                            emergencies. The signs and symptoms of potential                            delayed complications were discussed with the                            patient. Return to normal activities tomorrow.                            Written discharge instructions were provided to the                            patient.                           - Resume previous diet.                           - Continue present medications.                           - No aspirin, ibuprofen, naproxen, or other                            non-steroidal anti-inflammatory drugs.                           - Diflucan (fluconazole) 400mg  po x 1, then 200 mg                            PO daily for 2 weeks.                           - After using steroid inhalers, please rinse mouth                            thoroughly.                           - If any further problems, upper GI series (to                            delineate anatomy better) followed by CT                            Abdo/pelvis. I would have low threshold in                            repeating EGD with biopsies and also to evaluate                            short segment Barrett's.                           -  The findings and recommendations were discussed                            with the patient's family. Lynann Bologna, MD 02/20/2023 12:31:35 PM This report has been signed electronically.

## 2023-02-20 NOTE — Patient Instructions (Addendum)
Continue present medications. No aspirin, ibuprofen, naproxen, or other  non-steroidal anti-inflammatory drugs. Diflucan (fluconazole) 400mg  po x 1, then 200 mg  PO daily for 2 weeks. After using steroid inhalers, please rinse mouth thoroughly. If any further problems, upper GI series (to    delineate anatomy better) followed by CT Abdo/pelvis. I would have low threshold in  repeating EGD with biopsies and also to evaluate   short segment Barrett's.   Full liquid diet today!!!!!                          YOU HAD AN ENDOSCOPIC PROCEDURE TODAY AT THE [facility]:   Refer to the procedure report that was given to you for any specific questions about what was found during the examination.  If the procedure report does not answer your questions, please call your gastroenterologist to clarify.  If you requested that your care partner not be given the details of your procedure findings, then the procedure report has been included in a sealed envelope for you to review at your convenience later.  YOU SHOULD EXPECT: Some feelings of bloating in the abdomen. Passage of more gas than usual.  Walking can help get rid of the air that was put into your GI tract during the procedure and reduce the bloating.  Please Note:  You might notice some irritation and congestion in your nose or some drainage.  This is from the oxygen used during your procedure.  There is no need for concern and it should clear up in a day or so.  SYMPTOMS TO REPORT IMMEDIATELY: Following upper endoscopy (EGD)  Vomiting of blood or coffee ground material  New chest pain or pain under the shoulder blades  Painful or persistently difficult swallowing  New shortness of breath  Fever of 100F or higher  Black, tarry-looking stools  For urgent or emergent issues, a gastroenterologist can be reached at any hour by calling [phone]. Do not use MyChart messaging for urgent concerns.    DIET:  Drink plenty of fluids but  you should avoid alcoholic beverages for 24 hours.  ACTIVITY:  You should plan to take it easy for the rest of today and you should NOT DRIVE or use heavy machinery until tomorrow (because of the sedation medicines used during the test).    FOLLOW UP: Our staff will call the number listed on your records the next business day following your procedure.  We will call around 7:15- 8:00 am to check on you and address any questions or concerns that you may have regarding the information given to you following your procedure. If we do not reach you, we will leave a message.     If any biopsies were taken you will be contacted by phone or by letter within the next 1-3 weeks.  Please call us at [phone] if you have not heard about the biopsies in 3 weeks.    SIGNATURES/CONFIDENTIALITY: You and/or your care partner have signed paperwork which will be entered into your electronic medical record.  These signatures attest to the fact that that the information above on your After Visit Summary has been reviewed and is understood.  Full responsibility of the confidentiality of this discharge information lies with you and/or your care-partner.

## 2023-02-20 NOTE — Progress Notes (Signed)
Chief Complaint: Vomiting and dysphagia   HPI:    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 Chales Abrahams, who presents to clinic today for complaint of vomiting and dysphagia.    01/18/2021 CT chest abdomen pelvis with contrast showed redemonstrated postoperative findings of distal gastrectomy and gastrojejunostomy, no evidence of recurrent or metastatic disease in the chest abdomen or pelvis, unchanged bandlike scarring and volume loss of the right lung base sequela of prior infection or inflammation.    01/20/2022 patient seen in clinic for nausea and vomiting.  At that time discussed history of gastric cancer.  EGD in January 2021 with a 2 cm Barrett's esophagus small gastric remnant with angular takeoff of a Farron/U currently moves, had stopped marijuana and smoking, at that time recommended EGD and Phenergan 25 mg p.o. OD.  Discussed a colonoscopy with patient refused screening.  Continue Pantoprazole 40 p.o. daily.    01/28/2022 EGD with esophageal mucosal changes consistent with short segment Barrett's, patent Billroth II gastrojejunostomy with erosion, erythema and edema, medium out of food residue in the stomach.  At that time increase Pantoprazole to 40 twice daily, small more frequent meals.  Continue IV iron.    04/20/2022 iron studies normal.  CBC with hemoglobin 11.1, improved over the past 3 months.    05/03/2022 patient called our clinic with some blood in her stool.  She was scheduled with me.    05/06/2022 patient seen and clinic with possible blood in her vomit.  Discussed increasing vomiting symptoms which are somewhat chronic for her regardless of Phenergan 25 mg twice daily.  Still on Pantoprazole 40 twice daily but had run out of Carafate.  At that time ordered Hemoccult study and checked a CBC.  Continued on  Pantoprazole 40 twice daily and refilled Carafate.  Discussed increasing Phenergan to 25 mg every 8 hours.    10/08/2022 CT renal stone study with no urinary calculi, disc bulge at L4-5.    12/22/2022 CBC with a hemoglobin around her baseline over the past 6 months at 10.7.  Normal MCV.  CMP with a creatinine of 1.06 and otherwise normal.    02/08/2023 patient called in to report 2 to 3 weeks of daily vomiting.  Still on Carafate.  Also discussed fullness when swallowing related treated for COVID on 01/26/2023.    Today, the patient presents to clinic and tells me that she has been vomiting every day for a month.  Apparently cannot keep anything down including water, though she was able to drink a laxative and keep that down the other day.  Tells me that food just does not stay down, typically last about 30 minutes and then will come back out.  She has lost another 10 pounds or so.  Also had some issues where she felt like food was getting hung up in her throat for a while but has not experienced that over the past week.  In general she is just very worried as this seems to be the same symptoms she was having prior to having stomach cancer diagnosis.  She would like to make sure nothing is going on.  Currently taking Pantoprazole 40 mg 2 tablets in the morning and 2 at night.  Unaware this is an overdose.  Also using Carafate 4 times daily.  Associated symptoms include some epigastric pain.  Denies fever, chills or symptoms that awaken her from sleep.       Past Medical History:  Diagnosis Date   Anxiety      per pt   Arthritis      back   Barrett esophagus     Costochondritis     Esophageal reflux      On omeprazole   Family history of cancer     Headache(784.0) 2011    Migraines   Heart murmur      never caused any problems   History of hiatal hernia     History of radiation therapy 05/12/14- 06/17/14    post-op gastric region/ nodes/ 45 Gy in 25 fractions.    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 [id number]   Wears partial dentures      upper dentures               Past Surgical History:  Procedure Laterality Date   CESAREAN SECTION        x 2   ESOPHAGOGASTRODUODENOSCOPY N/A 09/16/2013    Procedure: ESOPHAGOGASTRODUODENOSCOPY (EGD);  Surgeon: Graylin Shiver, MD;  Location: [facility];  Service: Endoscopy;  Laterality: N/A;   ESOPHAGOGASTRODUODENOSCOPY   12/09/2019    [facility]   GASTROSTOMY        r/t stomach cancer   IR CV LINE INJECTION   08/30/2017   IR FLUORO GUIDE PORT INSERTION RIGHT   03/21/2017   IR US GUIDE VASC ACCESS RIGHT   03/21/2017   LAPAROSCOPY N/A 09/20/2013    Procedure: DIAGNOSTIC LAPAROSCOPY,DISTAL GASTRECTOMY AND FEEDING GASTROJEJUNOSTOMY;  Surgeon: Almond Lint, MD;  Location: [facility];  Service: General;  Laterality: N/A;   OOPHORECTOMY        Around 1985 left ovary removal   PORTACATH PLACEMENT Left 11/18/2013    Procedure: INSERTION PORT-A-CATH;  Surgeon: Almond Lint, MD;  Location: [facility];  Service: General;  Laterality: Left;   TONSILLECTOMY        Around 1994   TUBAL LIGATION       UNILATERAL SALPINGECTOMY Left 11/09/2017    Procedure: UNILATERAL SALPINGECTOMY;  Surgeon: Tereso Newcomer, MD;  Location: [facility];  Service: Gynecology;  Laterality: Left;   UPPER GASTROINTESTINAL ENDOSCOPY       VAGINAL HYSTERECTOMY Left 11/09/2017    Procedure: HYSTERECTOMY VAGINAL WITH LEFT SALPINGECTOMY;  Surgeon: Tereso Newcomer, MD;  Location: [facility];  Service: Gynecology;  Laterality: Left;                Current Outpatient Medications  Medication Sig Dispense Refill   fluticasone (FLONASE) 50 MCG/ACT nasal spray Place 1 spray into both nostrils daily. (Patient  not taking: Reported on 11/30/2022) 16 g 2   gabapentin (NEURONTIN) 600 MG tablet Take 1 tablet (600 mg total) by mouth 2 (two) times daily. 120 tablet 5   HYDROcodone-acetaminophen (NORCO/VICODIN) 5-325 MG tablet Take 1 tablet by mouth every 6 (six) hours as needed for moderate pain (pain score 4-6). 60 tablet 0   lidocaine-prilocaine (EMLA) cream Apply topically as needed. 30 g 6  Multiple Vitamins-Minerals (WOMENS MULTIVITAMIN PO) Take by mouth.       nitroGLYCERIN (NITROSTAT) 0.4 MG SL tablet Place 1 tablet (0.4 mg total) under the tongue every 5 (five) minutes as needed for chest pain. (Patient not taking: No sig reported) 25 tablet 2   pantoprazole (PROTONIX) 40 MG tablet Take 1 tablet (40 mg total) by mouth 2 (two) times daily. 60 tablet 6   predniSONE (DELTASONE) 10 MG tablet Take 10 mg by mouth.       promethazine (PHENERGAN) 25 MG tablet Take 1 tablet (25 mg total) by mouth 2 (two) times daily. 60 tablet 3   sucralfate (CARAFATE) 1 GM/10ML suspension Take 10 mLs (1 g total) by mouth 4 (four) times daily. 1200 mL 0   venlafaxine XR (EFFEXOR-XR) 75 MG 24 hr capsule Take 1 capsule (75 mg total) by mouth daily. 90 capsule 4               Current Facility-Administered Medications  Medication Dose Route Frequency Provider Last Rate Last Admin   alteplase (CATHFLO ACTIVASE) injection 2 mg  2 mg Intracatheter Once Josph Macho, MD       alteplase (CATHFLO ACTIVASE) injection 2 mg  2 mg Intracatheter Once Josph Macho, MD                 Facility-Administered Medications Ordered in Other Visits  Medication Dose Route Frequency Provider Last Rate Last Admin   sodium chloride flush (NS) 0.9 % injection 10 mL  10 mL Intravenous PRN Josph Macho, MD   10 mL at 01/03/18 1316   sodium chloride flush (NS) 0.9 % injection 10 mL  10 mL Intravenous PRN Josph Macho, MD   10 mL at 09/10/19 1114   sodium chloride flush (NS) 0.9 % injection 10 mL  10 mL Intravenous PRN Josph Macho,  MD   10 mL at 09/17/21 1023             Allergies as of 02/16/2023 - Review Complete 11/30/2022  Allergen Reaction Noted   Tape Itching 03/04/2015           Family History  Problem Relation Age of Onset   Hypertension Mother     Diabetes Mellitus II Mother     Other Mother          hx of hysterectomy for unspecified reason   Hypertension Sister     Hypertension Brother     Heart failure Maternal Grandmother     Heart Problems Maternal Grandmother     Hypertension Maternal Grandfather     Diabetes Maternal Grandfather     Colon cancer Paternal Uncle          dx. late 50s-early 60s   Gastric cancer Cousin 47   Colon cancer Cousin          dx. early-late 77s; (father also had colon cancer)   Colon cancer Paternal Uncle          dx. late 50s-early 60s   Lung cancer Paternal Uncle          dx. 84s; smoker   Colon polyps Paternal Uncle          unknown number   Heart attack Maternal Aunt     Heart Problems Paternal Aunt     Heart attack Paternal Grandfather     Colon cancer Paternal Grandfather          dx. 50s-60s   Colon polyps Brother  1 maternal half-brother had approx 3 polyps   Breast cancer Cousin 52   Breast cancer Cousin          dx. early 61s or earlier (father had colon cancer)   Lung cancer Other          PGF's brothers (x4); dx. later in life; lim info   Esophageal cancer Neg Hx     Rectal cancer Neg Hx            Social History         Socioeconomic History   Marital status: Legally Separated      Spouse name: Dennard Nip   Number of children: 2   Years of education: Not on file   Highest education level: Not on file  Occupational History      Employer: [employer]  Tobacco Use   Smoking status: Former      Current packs/day: 0.00      Average packs/day: 1 pack/day for 16.0 years (16.0 ttl pk-yrs)      Types: Cigarettes      Start date: 02/21/1982      Quit date: 02/21/1998      Years since quitting: 25.0   Smokeless tobacco:  Never   Tobacco comments:      0.5-1.0 ppd for 16 yrs  Vaping Use   Vaping status: Never Used  Substance and Sexual Activity   Alcohol use: Not Currently   Drug use: Not Currently   Sexual activity: Not Currently      Birth control/protection: Surgical  Other Topics Concern   Not on file  Social History Narrative   Not on file    Social Drivers of Health        Financial Resource Strain: Not on File (06/10/2021)    Received from [employer] [facility], [employer]     Financial Resource Strain: 0  Food Insecurity: Not on File (11/17/2022)    Received from [employer] [facility]     Food: 0  Transportation Needs: Not on File (06/10/2021)    Received from [employer] [facility], [employer] [facility]     Transportation: 0  Physical Activity: Not on File (06/10/2021)    Received from [place], [place]    Physical Activity     Physical Activity: 0  Stress: Not on File (06/10/2021)    Received from [facility], [place]    Stress     Stress: 0  Social Connections: Not on File (11/05/2022)    Received from [employer]    Social Connections     Connectedness: 0  Intimate Partner Violence: Not on file      Review of Systems:    Constitutional: No fever or chills Cardiovascular: No chest pain Respiratory: No SOB  Gastrointestinal: See HPI and otherwise negative    Physical Exam:  Vital signs: BP (!) 144/82   Pulse 70   Ht 5\' 1"  (1.549 m)   Wt 127 lb (57.6 kg)   LMP 02/28/2016 (Exact Date)   BMI 24.00 kg/m     Constitutional:   Pleasant AA female appears to be in NAD, Well developed, Well nourished, alert and cooperative Respiratory: Respirations even and unlabored. Lungs clear to auscultation bilaterally.   No wheezes, crackles, or rhonchi.  Cardiovascular: Normal S1, S2. No MRG. Regular rate and rhythm. No peripheral edema, cyanosis or pallor.  Gastrointestinal:  Soft, nondistended, mild generalized TTP, some worse in the epigastrium no rebound or guarding. Normal  bowel sounds. No  appreciable masses or hepatomegaly. Rectal:  Not performed.  Psychiatric: Demonstrates good judgement and reason without abnormal affect or behaviors.   RELEVANT LABS AND IMAGING: CBC Labs (Brief)          Component Value Date/Time    WBC 5.4 12/22/2022 0840    WBC 7.8 05/06/2022 1202    RBC 3.81 (L) 12/22/2022 0840    RBC 3.79 (L) 12/22/2022 0840    HGB 10.7 (L) 12/22/2022 0840    HGB 10.7 (L) 12/30/2020 1506    HGB 11.6 01/26/2017 1002    HGB 10.9 (L) 05/06/2015 0948    HCT 33.6 (L) 12/22/2022 0840    HCT 34.4 12/30/2020 1506    HCT 36.3 01/26/2017 1002    HCT 33.5 (L) 05/06/2015 0948    PLT 284 12/22/2022 0840    PLT 227 12/30/2020 1506    MCV 88.7 12/22/2022 0840    MCV 90 12/30/2020 1506    MCV 90 01/26/2017 1002    MCV 87.7 05/06/2015 0948    MCH 28.2 12/22/2022 0840    MCHC 31.8 12/22/2022 0840    RDW 13.6 12/22/2022 0840    RDW 13.0 12/30/2020 1506    RDW 13.6 01/26/2017 1002    RDW 15.1 (H) 05/06/2015 0948    LYMPHSABS 2.4 12/22/2022 0840    LYMPHSABS 1.9 12/30/2020 1506    LYMPHSABS 1.5 01/26/2017 1002    LYMPHSABS 0.6 (L) 05/06/2015 0948    MONOABS 0.5 12/22/2022 0840    MONOABS 0.2 05/06/2015 0948    EOSABS 0.2 12/22/2022 0840    EOSABS 0.1 12/30/2020 1506    EOSABS 0.1 01/26/2017 1002    BASOSABS 0.0 12/22/2022 0840    BASOSABS 0.0 12/30/2020 1506    BASOSABS 0.0 01/26/2017 1002    BASOSABS 0.1 05/06/2015 0948        CMP     Labs (Brief)          Component Value Date/Time    NA 140 12/22/2022 0840    NA 140 12/30/2020 1506    NA 140 01/26/2017 1002    NA 140 09/24/2015 0953    K 4.1 12/22/2022 0840    K 3.5 01/26/2017 1002    K 4.0 09/24/2015 0953    CL 103 12/22/2022 0840    CL 106 01/26/2017 1002    CO2 33 (H) 12/22/2022 0840    CO2 26 01/26/2017 1002    CO2 23 09/24/2015 0953    GLUCOSE 93 12/22/2022 0840    GLUCOSE 105 01/26/2017 1002    BUN 12 12/22/2022 0840    BUN 14 12/30/2020 1506    BUN 6 (L) 01/26/2017 1002    BUN 8.6  09/24/2015 0953    CREATININE 1.06 (H) 12/22/2022 0840    CREATININE 1.1 01/26/2017 1002    CREATININE 1.0 09/24/2015 0953    CALCIUM 9.0 12/22/2022 0840    CALCIUM 8.9 01/26/2017 1002    CALCIUM 9.6 09/24/2015 0953    PROT 6.9 12/22/2022 0840    PROT 6.2 12/30/2020 1506    PROT 6.8 01/26/2017 1002    PROT 7.4 09/24/2015 0953    ALBUMIN 4.2 12/22/2022 0840    ALBUMIN 4.2 12/30/2020 1506    ALBUMIN 4.0 08/08/2016 1404    ALBUMIN 4.0 09/24/2015 0953    AST 15 12/22/2022 0840    AST 17 09/24/2015 0953    ALT 11 12/22/2022 0840    ALT 17 01/26/2017 1002    ALT 11  09/24/2015 0953    ALKPHOS 72 12/22/2022 0840    ALKPHOS 68 01/26/2017 1002    ALKPHOS 99 09/24/2015 0953    BILITOT 0.5 12/22/2022 0840    BILITOT 0.83 09/24/2015 0953    GFRNONAA >60 12/22/2022 0840    GFRAA >60 10/17/2019 1956    GFRAA >60 09/10/2019 1030        Assessment: 1.  Nausea and vomiting: Constant over the past month, patient does have chronic vomiting but is typically only every once in a while and now has become more frequent uncontrolled on Phenergan; consider anastomotic ulcer from prior gastric surgery versus gastric outlet obstruction versus other 2.  Weight loss 3.  GERD: Uncontrolled on Pantoprazole 40 mg 2 tabs twice daily as well as Carafate 4.  History of gastric cancer   Plan: 1.  Labs today to include a CBC and CMP 2.  Discussed with patient she needs to only be taking Pantoprazole 40 mg 30-60 minutes before breakfast and then again before dinner.  The dose she is taking currently is an overdose.  Refilled her normal prescription #60 with 5 refills. 3.  Continue Carafate 4.  Scheduled patient for an EGD on Monday, 02/20/2023 for ongoing symptoms, occasional dysphagia and vomiting with history of gastric cancer.  Did provide the patient a detailed list of risks for the procedure and she agrees to proceed. 5.  Continue Phenergan as needed or scheduled every 6 hours 6.  Patient to follow in  clinic per recommendations after time of procedure.   Hyacinth Meeker, PA-C [facility]     Attending physician's note   I have taken history, reviewed the chart and examined the patient. I performed a substantive portion of this encounter, including complete performance of at least one of the key components, in conjunction with the APP. I agree with the Advanced Practitioner's note, impression and recommendations.    Edman Circle, MD [facility] [phone]

## 2023-02-20 NOTE — Progress Notes (Signed)
Went over discharge paper work with patient and care partner.  Pt denies any pain or discomfort.  Denies any nausea.   Informed of full liquid diet for the rest of today. Verbalizes understanding

## 2023-02-21 ENCOUNTER — Telehealth: Payer: Self-pay | Admitting: *Deleted

## 2023-02-21 NOTE — Telephone Encounter (Signed)
Patient states she was vomiting all night until 1AM. Patient states she is doing better now, is unsure if this is normal.

## 2023-02-21 NOTE — Telephone Encounter (Signed)
Patient called stating she had a missed call and is requesting a call back to discuss previous message. Please advise.

## 2023-02-21 NOTE — Telephone Encounter (Signed)
 Returned the patient's phone call. She reports that the nausea and vomiting lasted after her procedure until last night. She took phenergan  and this did not help. She is feeling better today. Reviewed the findings from her procedure with her including that if further problems persist she will need an Upper GI series and CT scan of the Abd/pelvis per Dr. Charlanne. Awaiting pathology results for the moment. I encouraged her to call the on call Physician if she should continue to have the persistent nausea and vomiting.

## 2023-02-21 NOTE — Telephone Encounter (Signed)
 Post procedure follow up call attempted.  No answer - LVM.

## 2023-02-21 NOTE — Telephone Encounter (Signed)
Left VM for patient to call back

## 2023-02-22 ENCOUNTER — Other Ambulatory Visit: Payer: Self-pay

## 2023-02-22 ENCOUNTER — Emergency Department (HOSPITAL_BASED_OUTPATIENT_CLINIC_OR_DEPARTMENT_OTHER): Payer: 59

## 2023-02-22 ENCOUNTER — Encounter (HOSPITAL_BASED_OUTPATIENT_CLINIC_OR_DEPARTMENT_OTHER): Payer: Self-pay | Admitting: Emergency Medicine

## 2023-02-22 ENCOUNTER — Emergency Department (HOSPITAL_BASED_OUTPATIENT_CLINIC_OR_DEPARTMENT_OTHER)
Admission: EM | Admit: 2023-02-22 | Discharge: 2023-02-22 | Disposition: A | Discharge status: Home / Self Care | Payer: 59 | Attending: Emergency Medicine | Admitting: Emergency Medicine

## 2023-02-22 DIAGNOSIS — R1010 Upper abdominal pain, unspecified: Secondary | ICD-10-CM | POA: Diagnosis not present

## 2023-02-22 DIAGNOSIS — R112 Nausea with vomiting, unspecified: Secondary | ICD-10-CM | POA: Diagnosis not present

## 2023-02-22 DIAGNOSIS — Z85028 Personal history of other malignant neoplasm of stomach: Secondary | ICD-10-CM | POA: Insufficient documentation

## 2023-02-22 DIAGNOSIS — R079 Chest pain, unspecified: Secondary | ICD-10-CM | POA: Insufficient documentation

## 2023-02-22 LAB — COMPREHENSIVE METABOLIC PANEL
ALT: 26 U/L (ref 0–44)
AST: 27 U/L (ref 15–41)
Albumin: 3.8 g/dL (ref 3.5–5.0)
Alkaline Phosphatase: 78 U/L (ref 38–126)
Anion gap: 9 (ref 5–15)
BUN: 12 mg/dL (ref 6–20)
CO2: 27 mmol/L (ref 22–32)
Calcium: 9 mg/dL (ref 8.9–10.3)
Chloride: 103 mmol/L (ref 98–111)
Creatinine, Ser: 0.79 mg/dL (ref 0.44–1.00)
GFR, Estimated: >60 mL/min (ref >60)
Glucose, Bld: 173 mg/dL — ABNORMAL HIGH (ref 70–99)
Potassium: 3.7 mmol/L (ref 3.5–5.1)
Sodium: 139 mmol/L (ref 135–145)
Total Bilirubin: 0.5 mg/dL (ref 0.0–1.2)
Total Protein: 7.2 g/dL (ref 6.5–8.1)

## 2023-02-22 LAB — CBC WITH DIFFERENTIAL/PLATELET
Abs Immature Granulocytes: 0.01 10*3/uL (ref 0.00–0.07)
Basophils Absolute: 0 10*3/uL (ref 0.0–0.1)
Basophils Relative: 0 %
Eosinophils Absolute: 0 10*3/uL (ref 0.0–0.5)
Eosinophils Relative: 0 %
HCT: 34.4 % — ABNORMAL LOW (ref 36.0–46.0)
Hemoglobin: 11 g/dL — ABNORMAL LOW (ref 12.0–15.0)
Immature Granulocytes: 0 %
Lymphocytes Relative: 19 %
Lymphs Abs: 1.5 10*3/uL (ref 0.7–4.0)
MCH: 28.5 pg (ref 26.0–34.0)
MCHC: 32 g/dL (ref 30.0–36.0)
MCV: 89.1 fL (ref 80.0–100.0)
Monocytes Absolute: 0.5 10*3/uL (ref 0.1–1.0)
Monocytes Relative: 7 %
Neutro Abs: 6 10*3/uL (ref 1.7–7.7)
Neutrophils Relative %: 74 %
Platelets: 224 10*3/uL (ref 150–400)
RBC: 3.86 MIL/uL — ABNORMAL LOW (ref 3.87–5.11)
RDW: 14.5 % (ref 11.5–15.5)
WBC: 8 10*3/uL (ref 4.0–10.5)
nRBC: 0 % (ref 0.0–0.2)

## 2023-02-22 LAB — TROPONIN I (HIGH SENSITIVITY): Troponin I (High Sensitivity): 4 ng/L (ref <18)

## 2023-02-22 LAB — LIPASE, BLOOD: Lipase: 22 U/L (ref 11–51)

## 2023-02-22 MED ORDER — HYDROMORPHONE HCL 1 MG/ML IJ SOLN
0.5000 mg | Freq: Once | INTRAMUSCULAR | Status: AC
  Administered 2023-02-22: 0.5 mg via INTRAVENOUS
  Filled 2023-02-22: qty 1

## 2023-02-22 MED ORDER — HYDROMORPHONE HCL 1 MG/ML IJ SOLN
1.0000 mg | Freq: Once | INTRAMUSCULAR | Status: AC
  Administered 2023-02-22: 1 mg via INTRAVENOUS
  Filled 2023-02-22: qty 1

## 2023-02-22 MED ORDER — FENTANYL CITRATE PF 50 MCG/ML IJ SOSY
100.0000 ug | PREFILLED_SYRINGE | Freq: Once | INTRAMUSCULAR | Status: AC
  Administered 2023-02-22: 100 ug via INTRAVENOUS
  Filled 2023-02-22: qty 2

## 2023-02-22 MED ORDER — HEPARIN SOD (PORK) LOCK FLUSH 100 UNIT/ML IV SOLN
500.0000 [IU] | Freq: Once | INTRAVENOUS | Status: AC
  Administered 2023-02-22: 500 [IU]
  Filled 2023-02-22: qty 5

## 2023-02-22 MED ORDER — ONDANSETRON HCL 4 MG/2ML IJ SOLN
4.0000 mg | Freq: Once | INTRAMUSCULAR | Status: AC
  Administered 2023-02-22: 4 mg via INTRAVENOUS
  Filled 2023-02-22: qty 2

## 2023-02-22 NOTE — ED Notes (Signed)
 Patient transported to CT

## 2023-02-22 NOTE — Discharge Instructions (Addendum)
 Follow-up with your gastroenterologist.  Return to the emergency room if you have any worsening symptoms.

## 2023-02-22 NOTE — ED Provider Notes (Signed)
 Patient care was taken over from Dr. Midge.  Patient's had some ongoing issues with nausea and vomiting.  She had a recent endoscopy which revealed candidal esophagitis.  Since then she had a recurrence of the nausea and vomiting associated with some diffuse chest pain.  She does not have other symptoms that would be more concerning for ACS.  CT of the chest was done which did not show any evidence of esophageal injury or other concerns.  No other pain that would be more concerning for PE or aortic injury.  She symptomatically better in the ED and is tolerating oral fluids.  She was discharged in good condition.  Will follow-up with her gastroenterologist and/or primary care doctor.  Return precautions were given.   Lenor Hollering, MD 02/22/23 [phone]

## 2023-02-22 NOTE — ED Triage Notes (Signed)
 Pt states had biopsy done on throat. emesis, X 2 hours, chest pain longer. Hx of stomach cancer.

## 2023-02-22 NOTE — ED Provider Notes (Signed)
 [facility] Provider Note   CSN: [id number] Arrival date & time: 02/22/23  9473     History  Chief Complaint  Patient presents with   Chest Pain   Emesis    Paula [name] is a 58 y.o. female.  The history is provided by the patient.  Patient with extensive medical history presents with chest pain nausea and vomiting Underwent EGD on December 30, found to have candidal esophagitis as well as gastritis Over the past several hours she has had increasing diffuse chest pain and upper abdominal pain as well as multiple episodes of nonbloody emesis.  She has had a normal bowel movement.   Patient has significant history of gastric adenocarcinoma and previous history of Billroth gastrectomy Past Medical History:  Diagnosis Date   Anxiety    per Paula [name]   Arthritis    back   Barrett esophagus    Costochondritis    Esophageal reflux    On omeprazole    Family history of cancer    Headache(784.0) 2011   Migraines   Heart murmur    never caused any problems   History of hiatal hernia    History of radiation therapy 05/12/14- 06/17/14   post-op gastric region/ nodes/ 45 Gy in 25 fractions.    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 [id number]   Wears partial dentures    upper dentures    Home Medications Prior to Admission medications   Medication Sig Start Date End Date Taking? Authorizing Provider  fluconazole  (DIFLUCAN ) 200 MG tablet Take 400 mg once, then 200 mg daily for 2 weeks 02/20/23   Charlanne Groom, MD  fluticasone  (FLONASE ) 50 MCG/ACT nasal spray Place 1 spray into both nostrils daily. 12/10/18   Khatri, Hina, PA-C  gabapentin  (NEURONTIN ) 600 MG tablet  Take 1 tablet (600 mg total) by mouth 2 (two) times daily. 12/17/22 04/16/23  Anyanwu, Ugonna A, MD  HYDROcodone -acetaminophen  (NORCO/VICODIN) 5-325 MG tablet Take 1 tablet by mouth every 6 (six) hours as needed for moderate pain (pain score 4-6). 02/01/23   Timmy Maude SAUNDERS, MD  lidocaine -prilocaine  (EMLA ) cream Apply topically as needed. 12/22/22   Timmy Maude SAUNDERS, MD  Multiple Vitamins-Minerals (WOMENS MULTIVITAMIN PO) Take by mouth.    [provider]  nitroGLYCERIN  (NITROSTAT ) 0.4 MG SL tablet Place 1 tablet (0.4 mg total) under the tongue every 5 (five) minutes as needed for chest pain. Patient not taking: No sig reported 12/20/18 05/15/19  Jerilynn Lamarr HERO, NP  pantoprazole  (PROTONIX ) 40 MG tablet Take 1 tablet (40 mg total) by mouth 2 (two) times daily. 02/16/23   Beather Delon Gibson, PA  promethazine  (PHENERGAN ) 25 MG tablet Take 1 tablet (25 mg total) by mouth 2 (two) times daily. 09/28/22   Timmy Maude SAUNDERS, MD  sucralfate  (CARAFATE ) 1 GM/10ML suspension Take 10 mLs (1 g total) by mouth 4 (four) times daily. 05/06/22   Beather Delon Gibson, PA  venlafaxine  XR (EFFEXOR -XR) 75 MG 24 hr capsule Take 1 capsule (75 mg total) by mouth daily. 01/15/23   Anyanwu, Ugonna A, MD      Allergies  Tape    Review of Systems   Review of Systems  Physical Exam Updated Vital Signs BP (!) 160/89 (BP Location: Right Arm)   Pulse 94   Temp 98.1 F (36.7 C) (Oral)   Resp 17   LMP 02/28/2016 (Exact Date)   SpO2 99%  Physical Exam CONSTITUTIONAL: Chronically ill-appearing HEAD: Normocephalic/atraumatic EYES: EOMI/PERRL ENMT: Voice is hoarse but no stridor, no drooling, no angioedema, uvula midline with erythema but no edema NECK: supple no meningeal signs, no crepitus CV: S1/S2 noted, no murmurs/rubs/gallops noted LUNGS: Lungs are clear to auscultation bilaterally, no apparent distress ABDOMEN: soft, diffuse moderate upper abdominal tenderness, no rebound or guarding, bowel  sounds noted throughout abdomen NEURO: Paula [name] is awake/alert/appropriate, moves all extremitiesx4.  No facial droop.   EXTREMITIES: full ROM SKIN: warm, color normal PSYCH: Anxious  ED Results / Procedures / Treatments   Labs (all labs ordered are listed, but only abnormal results are displayed) Labs Reviewed  COMPREHENSIVE METABOLIC PANEL - Abnormal; Notable for the following components:      Result Value   Glucose, Bld 173 (*)    All other components within normal limits  CBC WITH DIFFERENTIAL/PLATELET - Abnormal; Notable for the following components:   RBC 3.86 (*)    Hemoglobin 11.0 (*)    HCT 34.4 (*)    All other components within normal limits  LIPASE, BLOOD  TROPONIN I (HIGH SENSITIVITY)    EKG EKG Interpretation Date/Time:  Wednesday February 22 2023 06:28:46 EST Ventricular Rate:  78 PR Interval:  119 QRS Duration:  80 QT Interval:  392 QTC Calculation: 447 R Axis:   67  Text Interpretation: Sinus rhythm Borderline short PR interval Left ventricular hypertrophy No significant change since last tracing Confirmed by Midge Golas ([id number]) on 02/22/2023 6:36:03 AM  Radiology No results found.  Procedures Procedures    Medications Ordered in ED Medications  fentaNYL  (SUBLIMAZE ) injection 100 mcg (100 mcg Intravenous Given 02/22/23 0557)  ondansetron  (ZOFRAN ) injection 4 mg (4 mg Intravenous Given 02/22/23 0556)    ED Course/ Medical Decision Making/ A&P Clinical Course as of 02/22/23 0700  Wed Feb 22, 2023  0600 Patient underwent endoscopy on December 30 and is now having intractable nausea vomiting and diffuse chest and upper abdominal pain.  Patient appears uncomfortable, writhing in the bed.  Patient is calling out for Dilaudid  and Phenergan  [DW]  0626 Glucose(!): 173 Mild hyperglycemia [DW]  0649 Patient appears more comfortable but still reporting pain and nausea.  Patient reports long history of episodes of intractable vomiting, but this feels differentafter  her endoscopy  Esophageal perforation or other complications are still in the differential after recent EGD  Will proceed with CT imaging of her chest.  Discussed this with Dr. Adele with radiology who recommends CT chest without IV contrast but to give oral contrast [DW]  0700 Signed out to dr lenor at shift change to f/u on CT chest.  If negative and can take PO fluids, patient can be discharged [DW]    Clinical Course User Index [DW] Midge Golas, MD                                 Medical Decision Making Amount and/or Complexity of Data Reviewed Labs: ordered. Decision-making details documented in ED Course. Radiology: ordered. ECG/medicine tests: ordered.  Risk Prescription drug management.   This patient presents to the ED for concern of chest pain, this involves  an extensive number of treatment options, and is a complaint that carries with it a high risk of complications and morbidity.  The differential diagnosis includes but is not limited to acute coronary syndrome, aortic dissection, pulmonary embolism, pericarditis, pneumothorax, pneumonia, myocarditis, pleurisy, esophageal rupture    Comorbidities that complicate the patient evaluation: Patient's presentation is complicated by their history of gastric adenocarcinoma  Social Determinants of Health: Patient's  previous tobacco use history   increases the complexity of managing their presentation  Additional history obtained: Records reviewed  recent ED records reviewed  Lab Tests: I Ordered, and personally interpreted labs.  The pertinent results include: Mild hyperglycemia  Imaging Studies ordered: I ordered imaging studies including X-ray chest     Cardiac Monitoring: The patient was maintained on a cardiac monitor.  I personally viewed and interpreted the cardiac monitor which showed an underlying rhythm of:  sinus rhythm  Medicines ordered and prescription drug management: I ordered medication including  fentanyl  for pain Reevaluation of the patient after these medicines showed that the patient    improved    Consultations Obtained: I requested consultation with the radiologist Dr. Adele , and discussed  findings as well as pertinent plan - they recommend: CT chest without IV contrast  Reevaluation: After the interventions noted above, I reevaluated the patient and found that they have :improved  Complexity of problems addressed: Patient's presentation is most consistent with  acute presentation with potential threat to life or bodily function          Final Clinical Impression(s) / ED Diagnoses Final diagnoses:  Nausea and vomiting, unspecified vomiting type    Rx / DC Orders ED Discharge Orders     None         Midge Golas, MD 02/22/23 [phone]

## 2023-02-23 ENCOUNTER — Encounter: Payer: Self-pay | Admitting: Gastroenterology

## 2023-02-24 ENCOUNTER — Inpatient Hospital Stay: Payer: 59

## 2023-02-27 ENCOUNTER — Encounter: Payer: Self-pay | Admitting: Gastroenterology

## 2023-02-27 LAB — SURGICAL PATHOLOGY

## 2023-03-03 ENCOUNTER — Inpatient Hospital Stay: Payer: 59 | Attending: Hematology & Oncology

## 2023-03-03 ENCOUNTER — Other Ambulatory Visit: Payer: Self-pay | Admitting: Hematology & Oncology

## 2023-03-03 DIAGNOSIS — C163 Malignant neoplasm of pyloric antrum: Secondary | ICD-10-CM

## 2023-03-03 DIAGNOSIS — C169 Malignant neoplasm of stomach, unspecified: Secondary | ICD-10-CM

## 2023-03-03 MED ORDER — HYDROCODONE-ACETAMINOPHEN 5-325 MG PO TABS: 1.0000 | ORAL_TABLET | Freq: Four times a day (QID) | ORAL | 0 refills | Status: DC | PRN

## 2023-03-08 ENCOUNTER — Encounter: Payer: Self-pay | Admitting: Family

## 2023-03-10 ENCOUNTER — Encounter: Payer: 59 | Admitting: Gastroenterology

## 2023-04-05 ENCOUNTER — Other Ambulatory Visit: Payer: Self-pay | Admitting: *Deleted

## 2023-04-05 DIAGNOSIS — C163 Malignant neoplasm of pyloric antrum: Secondary | ICD-10-CM

## 2023-04-05 DIAGNOSIS — C169 Malignant neoplasm of stomach, unspecified: Secondary | ICD-10-CM

## 2023-04-05 MED ORDER — HYDROCODONE-ACETAMINOPHEN 5-325 MG PO TABS: 1.0000 | ORAL_TABLET | Freq: Four times a day (QID) | ORAL | 0 refills | Status: DC | PRN

## 2023-04-25 ENCOUNTER — Inpatient Hospital Stay

## 2023-04-25 ENCOUNTER — Other Ambulatory Visit: Payer: Self-pay | Admitting: *Deleted

## 2023-04-25 ENCOUNTER — Encounter: Payer: Self-pay | Admitting: Medical Oncology

## 2023-04-25 ENCOUNTER — Inpatient Hospital Stay (HOSPITAL_BASED_OUTPATIENT_CLINIC_OR_DEPARTMENT_OTHER): Admitting: Medical Oncology

## 2023-04-25 ENCOUNTER — Inpatient Hospital Stay: Attending: Hematology & Oncology

## 2023-04-25 VITALS — BP 121/69 | HR 68 | Temp 98.4°F | Resp 17 | Ht 64.0 in | Wt 128.0 lb

## 2023-04-25 DIAGNOSIS — D509 Iron deficiency anemia, unspecified: Secondary | ICD-10-CM | POA: Insufficient documentation

## 2023-04-25 DIAGNOSIS — Z85028 Personal history of other malignant neoplasm of stomach: Secondary | ICD-10-CM | POA: Diagnosis not present

## 2023-04-25 DIAGNOSIS — D500 Iron deficiency anemia secondary to blood loss (chronic): Secondary | ICD-10-CM

## 2023-04-25 DIAGNOSIS — Z862 Personal history of diseases of the blood and blood-forming organs and certain disorders involving the immune mechanism: Secondary | ICD-10-CM

## 2023-04-25 DIAGNOSIS — C169 Malignant neoplasm of stomach, unspecified: Secondary | ICD-10-CM | POA: Diagnosis not present

## 2023-04-25 DIAGNOSIS — Z923 Personal history of irradiation: Secondary | ICD-10-CM | POA: Insufficient documentation

## 2023-04-25 DIAGNOSIS — R5383 Other fatigue: Secondary | ICD-10-CM

## 2023-04-25 DIAGNOSIS — Z79899 Other long term (current) drug therapy: Secondary | ICD-10-CM | POA: Insufficient documentation

## 2023-04-25 DIAGNOSIS — Z903 Acquired absence of stomach [part of]: Secondary | ICD-10-CM | POA: Insufficient documentation

## 2023-04-25 DIAGNOSIS — R002 Palpitations: Secondary | ICD-10-CM

## 2023-04-25 LAB — CBC WITH DIFFERENTIAL (CANCER CENTER ONLY)
Abs Immature Granulocytes: 0.03 10*3/uL (ref 0.00–0.07)
Basophils Absolute: 0 10*3/uL (ref 0.0–0.1)
Basophils Relative: 1 %
Eosinophils Absolute: 0.1 10*3/uL (ref 0.0–0.5)
Eosinophils Relative: 1 %
HCT: 32.9 % — ABNORMAL LOW (ref 36.0–46.0)
Hemoglobin: 10.4 g/dL — ABNORMAL LOW (ref 12.0–15.0)
Immature Granulocytes: 1 %
Lymphocytes Relative: 41 %
Lymphs Abs: 2.1 10*3/uL (ref 0.7–4.0)
MCH: 28.7 pg (ref 26.0–34.0)
MCHC: 31.6 g/dL (ref 30.0–36.0)
MCV: 90.9 fL (ref 80.0–100.0)
Monocytes Absolute: 0.5 10*3/uL (ref 0.1–1.0)
Monocytes Relative: 10 %
Neutro Abs: 2.4 10*3/uL (ref 1.7–7.7)
Neutrophils Relative %: 46 %
Platelet Count: 295 10*3/uL (ref 150–400)
RBC: 3.62 MIL/uL — ABNORMAL LOW (ref 3.87–5.11)
RDW: 13.4 % (ref 11.5–15.5)
Smear Review: NORMAL
WBC Count: 5.1 10*3/uL (ref 4.0–10.5)
nRBC: 0 % (ref 0.0–0.2)

## 2023-04-25 LAB — CMP (CANCER CENTER ONLY)
ALT: 10 U/L (ref 0–44)
AST: 15 U/L (ref 15–41)
Albumin: 4.1 g/dL (ref 3.5–5.0)
Alkaline Phosphatase: 64 U/L (ref 38–126)
Anion gap: 5 (ref 5–15)
BUN: 16 mg/dL (ref 6–20)
CO2: 31 mmol/L (ref 22–32)
Calcium: 9.5 mg/dL (ref 8.9–10.3)
Chloride: 101 mmol/L (ref 98–111)
Creatinine: 1.09 mg/dL — ABNORMAL HIGH (ref 0.44–1.00)
GFR, Estimated: 60 mL/min — ABNORMAL LOW (ref >60)
Glucose, Bld: 151 mg/dL — ABNORMAL HIGH (ref 70–99)
Potassium: 4.4 mmol/L (ref 3.5–5.1)
Sodium: 137 mmol/L (ref 135–145)
Total Bilirubin: 0.4 mg/dL (ref 0.0–1.2)
Total Protein: 7.1 g/dL (ref 6.5–8.1)

## 2023-04-25 LAB — FERRITIN: Ferritin: 83 ng/mL (ref 11–307)

## 2023-04-25 LAB — VITAMIN B12: Vitamin B-12: 1878 pg/mL — ABNORMAL HIGH (ref 180–914)

## 2023-04-25 LAB — RETICULOCYTES
Immature Retic Fract: 11.8 % (ref 2.3–15.9)
RBC.: 3.62 MIL/uL — ABNORMAL LOW (ref 3.87–5.11)
Retic Count, Absolute: 59 10*3/uL (ref 19.0–186.0)
Retic Ct Pct: 1.6 % (ref 0.4–3.1)

## 2023-04-25 LAB — LACTATE DEHYDROGENASE: LDH: 129 U/L (ref 98–192)

## 2023-04-25 LAB — TSH: TSH: 1.53 u[IU]/mL (ref 0.350–4.500)

## 2023-04-25 NOTE — Progress Notes (Signed)
 Hematology and Oncology Follow Up Visit  Paula [name] [id number] 04/22/1965 58 y.o. 04/25/2023   Principle Diagnosis:  Stage IIA (T1a, N2, M0) gastric adenocarcinoma diagnosed in July of 2015 Intermittent iron deficiency anemia   Past Therapy: Status post diagnostic laparoscopy, distal gastrectomy with billroth II anastamosis and feeding gastrojejunostomy tube under the care of Dr. Donell Beers on 09/20/2013 Adjuvant systemic chemotherapy with 5-FU 600 mg/M2 with leucovorin on days 1, 8, 15, 22, 29 and 36 every 8 weeks Adjuvant concurrent chemoradiation with Xeloda 650 mg/M2 by mouth twice a day for 5 days every week with radiation. The patient completed the course of radiotherapy but she declined to take Xeloda during her radiation   Current Therapy:        Observation IV iron as indicated   Interim History:  Paula [name] is here today for follow-up.   She reports that she has been well overall but has had some issues with nausea and vomiting.She is followed by Dr. Chales Abrahams in GI for this and her gastric cancer. This was discussed at her last visit and sleeping at an incline and avoiding heavy meals late in the day was suggested. This has helped mildly. Her last CT C/A/P was on 01/18/2021 which did not show any evidence of metastatic disease Last Endo/Colo was on 02/20/2023- this was reassuring. She is overdue for a colonoscopy due to concerns with preparation she has elected to hold off at  this time.  She has been on B12 supplement once daily since Dec. She has noticed improvement in fatigue when taking this.  She does report occasional palpitations and dizziness that do not occur together. No chest pains.  No blood loss noted. No bruising or petechiae.  No fever, chills, n/v, cough, rash, dizziness, SOB, chest pain, abdominal pain or changes in bowel habits.  No swelling, tenderness, numbness or tingling in her extremities.  No falls or syncope reported.  Appetite is fair. She has  noticed an 8 pound weight loss since last August- this is unintentional.  Wt Readings from Last 3 Encounters:  04/25/23 128 lb (58.1 kg)  02/20/23 127 lb (57.6 kg)  02/16/23 127 lb (57.6 kg)    ECOG Performance Status: 1 - Symptomatic but completely ambulatory  Medications:  Allergies as of 04/25/2023       Reactions   Tape Itching   Ok with adhesive and paper tape         Medication List        Accurate as of April 25, 2023  2:17 PM. If you have any questions, ask your nurse or doctor.          STOP taking these medications    venlafaxine XR 75 MG 24 hr capsule Commonly known as: EFFEXOR-XR Stopped by: Rushie Chestnut       TAKE these medications    fluconazole 200 MG tablet Commonly known as: Diflucan Take 400 mg once, then 200 mg daily for 2 weeks   fluticasone 50 MCG/ACT nasal spray Commonly known as: FLONASE Place 1 spray into both nostrils daily.   gabapentin 600 MG tablet Commonly known as: NEURONTIN Take 1 tablet (600 mg total) by mouth 2 (two) times daily.   HYDROcodone-acetaminophen 5-325 MG tablet Commonly known as: NORCO/VICODIN Take 1 tablet by mouth every 6 (six) hours as needed for moderate pain (pain score 4-6).   lidocaine-prilocaine cream Commonly known as: EMLA Apply topically as needed.   nitroGLYCERIN 0.4 MG SL tablet Commonly known as: NITROSTAT  Place 1 tablet (0.4 mg total) under the tongue every 5 (five) minutes as needed for chest pain.   pantoprazole 40 MG tablet Commonly known as: PROTONIX Take 1 tablet (40 mg total) by mouth 2 (two) times daily.   promethazine 25 MG tablet Commonly known as: PHENERGAN Take 1 tablet (25 mg total) by mouth 2 (two) times daily.   sucralfate 1 GM/10ML suspension Commonly known as: CARAFATE Take 10 mLs (1 g total) by mouth 4 (four) times daily.   WOMENS MULTIVITAMIN PO Take by mouth.        Allergies:  Allergies  Allergen Reactions   Tape Itching    Ok with adhesive and paper  tape     Past Medical History, Surgical history, Social history, and Family History were reviewed and updated.  Review of Systems: All other 10 point review of systems is negative.   Physical Exam:  height is 5\' 4"  (1.626 m) and weight is 128 lb (58.1 kg). Her oral temperature is 98.4 F (36.9 C). Her blood pressure is 121/69 and her pulse is 68. Her respiration is 17 and oxygen saturation is 100%.   Wt Readings from Last 3 Encounters:  04/25/23 128 lb (58.1 kg)  02/20/23 127 lb (57.6 kg)  02/16/23 127 lb (57.6 kg)    Ocular: Sclerae unicteric, pupils equal, round and reactive to light Ear-nose-throat: Oropharynx clear, dentition fair Lymphatic: No cervical or supraclavicular adenopathy Lungs no rales or rhonchi, good excursion bilaterally Heart regular rate and rhythm, no murmur appreciated Abd soft, nontender, positive bowel sounds MSK no focal spinal tenderness, no joint edema Neuro: non-focal, well-oriented, appropriate affect Breasts: Deferred   Lab Results  Component Value Date   WBC 5.1 04/25/2023   HGB 10.4 (L) 04/25/2023   HCT 32.9 (L) 04/25/2023   MCV 90.9 04/25/2023   PLT 295 04/25/2023   Lab Results  Component Value Date   FERRITIN 71 12/22/2022   IRON 57 12/22/2022   TIBC 259 12/22/2022   UIBC 202 12/22/2022   IRONPCTSAT 22 12/22/2022   Lab Results  Component Value Date   RETICCTPCT 1.6 04/25/2023   RBC 3.62 (L) 04/25/2023   No results found for: "KPAFRELGTCHN", "LAMBDASER", "KAPLAMBRATIO" Lab Results  Component Value Date   IGGSERUM 1,273 05/21/2015   IGMSERUM 40 05/21/2015   No results found for: "TOTALPROTELP", "ALBUMINELP", "A1GS", "A2GS", "BETS", "BETA2SER", "GAMS", "MSPIKE", "SPEI"   Chemistry      Component Value Date/Time   NA 137 04/25/2023 1325   NA 140 12/30/2020 1506   NA 140 01/26/2017 1002   NA 140 09/24/2015 0953   K 4.4 04/25/2023 1325   K 3.5 01/26/2017 1002   K 4.0 09/24/2015 0953   CL 101 04/25/2023 1325   CL 106  01/26/2017 1002   CO2 31 04/25/2023 1325   CO2 26 01/26/2017 1002   CO2 23 09/24/2015 0953   BUN 16 04/25/2023 1325   BUN 14 12/30/2020 1506   BUN 6 (L) 01/26/2017 1002   BUN 8.6 09/24/2015 0953   CREATININE 1.09 (H) 04/25/2023 1325   CREATININE 1.1 01/26/2017 1002   CREATININE 1.0 09/24/2015 0953      Component Value Date/Time   CALCIUM 9.5 04/25/2023 1325   CALCIUM 8.9 01/26/2017 1002   CALCIUM 9.6 09/24/2015 0953   ALKPHOS 64 04/25/2023 1325   ALKPHOS 68 01/26/2017 1002   ALKPHOS 99 09/24/2015 0953   AST 15 04/25/2023 1325   AST 17 09/24/2015 0953   ALT 10 04/25/2023 1325  ALT 17 01/26/2017 1002   ALT 11 09/24/2015 0953   BILITOT 0.4 04/25/2023 1325   BILITOT 0.83 09/24/2015 1610      Encounter Diagnoses  Name Primary?   Gastric carcinoma (HCC) Yes   History of anemia due to vitamin B12 deficiency    Palpitations    Other fatigue     Impression and Plan: Paula [name] is a pleasant 58 yo [place] American female with history of stage II gastric cancer and partial gastrectomy followed by adjuvant chemo and radiation.  Given her history of treatment and symptoms of palpitations, weight changes, cold intolerance we will check a TSH. B12 lab also being added on today  Iron studies are pending. We will replace if needed.  RTC every 2 months for port flush RTC 6 months Carter, labs (CBC w/, CMP, LDH, iron, ferritin, retic, B12)  Rushie Chestnut, PA-C 3/4/20252:17 PM

## 2023-04-25 NOTE — Patient Instructions (Signed)

## 2023-04-26 ENCOUNTER — Encounter: Payer: Self-pay | Admitting: Medical Oncology

## 2023-04-26 LAB — IRON AND IRON BINDING CAPACITY (CC-WL,HP ONLY)
Iron: 51 ug/dL (ref 28–170)
Saturation Ratios: 18 % (ref 10.4–31.8)
TIBC: 287 ug/dL (ref 250–450)
UIBC: 236 ug/dL (ref 148–442)

## 2023-04-27 ENCOUNTER — Telehealth: Payer: Self-pay

## 2023-04-27 ENCOUNTER — Telehealth: Payer: Self-pay | Admitting: Medical Oncology

## 2023-04-27 NOTE — Telephone Encounter (Signed)
 Received phone call from patient who wasn't comfortable scheduling her next appointment until talking with an RN. Pt stated she was very concerned about her lab work. Pt states "I read all the messages [place], [place] sent me but I'm still worried." Pt states she went to an urgent care after being seen here "just to make sure I am ok and don't have the flu or anything b/c I was still dizzy" Pt educated on her counts and her trend over the past 8 months. Pt verbalized understanding. Pt aware she can be seen sooner if needed if she starts to feel bad or have active bleeding. Pt verbalized understanding and had no further questions. Pt states she will call back to make her appointments.

## 2023-04-27 NOTE — Telephone Encounter (Signed)
 Called patient to schedule her follow up appointment from 04/25/23 los. Patient said she would call back to schedule.Did not wish to schedule at this time.

## 2023-04-28 ENCOUNTER — Inpatient Hospital Stay: Payer: 59

## 2023-04-28 ENCOUNTER — Inpatient Hospital Stay: Payer: 59 | Admitting: Family

## 2023-05-03 ENCOUNTER — Other Ambulatory Visit: Payer: Self-pay | Admitting: Obstetrics & Gynecology

## 2023-05-03 ENCOUNTER — Other Ambulatory Visit: Payer: Self-pay | Admitting: *Deleted

## 2023-05-03 DIAGNOSIS — C163 Malignant neoplasm of pyloric antrum: Secondary | ICD-10-CM

## 2023-05-03 DIAGNOSIS — C169 Malignant neoplasm of stomach, unspecified: Secondary | ICD-10-CM

## 2023-05-03 DIAGNOSIS — N951 Menopausal and female climacteric states: Secondary | ICD-10-CM

## 2023-05-03 MED ORDER — HYDROCODONE-ACETAMINOPHEN 5-325 MG PO TABS: 1.0000 | ORAL_TABLET | Freq: Four times a day (QID) | ORAL | 0 refills | Status: DC | PRN

## 2023-05-03 MED ORDER — GABAPENTIN 600 MG PO TABS: 600.0000 mg | ORAL_TABLET | Freq: Two times a day (BID) | ORAL | 5 refills | Status: DC

## 2023-06-02 ENCOUNTER — Other Ambulatory Visit (HOSPITAL_BASED_OUTPATIENT_CLINIC_OR_DEPARTMENT_OTHER): Payer: Self-pay

## 2023-06-02 ENCOUNTER — Other Ambulatory Visit: Payer: Self-pay | Admitting: Hematology & Oncology

## 2023-06-02 DIAGNOSIS — C163 Malignant neoplasm of pyloric antrum: Secondary | ICD-10-CM

## 2023-06-02 DIAGNOSIS — C169 Malignant neoplasm of stomach, unspecified: Secondary | ICD-10-CM

## 2023-06-02 MED ORDER — HYDROCODONE-ACETAMINOPHEN 5-325 MG PO TABS
1.0000 | ORAL_TABLET | Freq: Four times a day (QID) | ORAL | 0 refills | Status: DC | PRN
  Filled 2023-06-02: qty 60, 15d supply, fill #0

## 2023-06-20 ENCOUNTER — Inpatient Hospital Stay: Attending: Hematology & Oncology

## 2023-06-30 ENCOUNTER — Other Ambulatory Visit (HOSPITAL_BASED_OUTPATIENT_CLINIC_OR_DEPARTMENT_OTHER): Payer: Self-pay

## 2023-06-30 ENCOUNTER — Other Ambulatory Visit: Payer: Self-pay | Admitting: *Deleted

## 2023-06-30 DIAGNOSIS — C163 Malignant neoplasm of pyloric antrum: Secondary | ICD-10-CM

## 2023-06-30 DIAGNOSIS — C169 Malignant neoplasm of stomach, unspecified: Secondary | ICD-10-CM

## 2023-06-30 MED ORDER — HYDROCODONE-ACETAMINOPHEN 5-325 MG PO TABS
1.0000 | ORAL_TABLET | Freq: Four times a day (QID) | ORAL | 0 refills | Status: DC | PRN
  Filled 2023-06-30: qty 60, 15d supply, fill #0

## 2023-07-31 ENCOUNTER — Other Ambulatory Visit (HOSPITAL_BASED_OUTPATIENT_CLINIC_OR_DEPARTMENT_OTHER): Payer: Self-pay

## 2023-07-31 ENCOUNTER — Other Ambulatory Visit: Payer: Self-pay | Admitting: *Deleted

## 2023-07-31 ENCOUNTER — Telehealth: Payer: Self-pay | Admitting: Gastroenterology

## 2023-07-31 DIAGNOSIS — C169 Malignant neoplasm of stomach, unspecified: Secondary | ICD-10-CM

## 2023-07-31 DIAGNOSIS — C163 Malignant neoplasm of pyloric antrum: Secondary | ICD-10-CM

## 2023-07-31 MED ORDER — HYDROCODONE-ACETAMINOPHEN 5-325 MG PO TABS
1.0000 | ORAL_TABLET | Freq: Four times a day (QID) | ORAL | 0 refills | Status: DC | PRN
  Filled 2023-07-31: qty 60, 15d supply, fill #0

## 2023-07-31 NOTE — Telephone Encounter (Signed)
 Patient requesting f/u call in regards to vomiting since last Friday night.   Also requesting medication refill for Carafate  be sent into [facility] on [address] in [place].   Please advise. Thank you

## 2023-07-31 NOTE — Telephone Encounter (Signed)
 Pt stated that she has been having a increase in reflux and nausea recently and request a prescription refill for her Sucralfate . Please review and advise

## 2023-08-01 NOTE — Telephone Encounter (Signed)
 Please do sucralfate  1 g p.o. 4 times daily x 2 weeks, 2 refills RG

## 2023-08-02 ENCOUNTER — Other Ambulatory Visit: Payer: Self-pay

## 2023-08-02 DIAGNOSIS — R1013 Epigastric pain: Secondary | ICD-10-CM

## 2023-08-02 DIAGNOSIS — R634 Abnormal weight loss: Secondary | ICD-10-CM

## 2023-08-02 DIAGNOSIS — R112 Nausea with vomiting, unspecified: Secondary | ICD-10-CM

## 2023-08-02 DIAGNOSIS — R1310 Dysphagia, unspecified: Secondary | ICD-10-CM

## 2023-08-02 MED ORDER — SUCRALFATE 1 GM/10ML PO SUSP: 1.0000 g | Freq: Four times a day (QID) | ORAL | 2 refills | Status: AC

## 2023-08-02 NOTE — Telephone Encounter (Signed)
 Prescription was sent in to the pharmacy. Pt made aware. Pt verbalized understanding with all questions answered.

## 2023-08-09 ENCOUNTER — Other Ambulatory Visit: Payer: Self-pay | Admitting: Hematology & Oncology

## 2023-08-09 ENCOUNTER — Telehealth: Payer: Self-pay | Admitting: Gastroenterology

## 2023-08-09 DIAGNOSIS — T451X5A Adverse effect of antineoplastic and immunosuppressive drugs, initial encounter: Secondary | ICD-10-CM

## 2023-08-09 NOTE — Telephone Encounter (Signed)
 Patient called and stated that she is needing Dr. [name] to prescribe her Phernergan. Patient is requesting a call back. Please advise.

## 2023-08-09 NOTE — Telephone Encounter (Signed)
**Note De-identified  [place] Obfuscation** Please advise 

## 2023-08-22 NOTE — Telephone Encounter (Signed)
 Please do Phenergan  25 mg p.o. every 8 hours as needed #20, 2RF.  Sedation precautions.  Do not drive while taking RG

## 2023-08-23 ENCOUNTER — Other Ambulatory Visit (HOSPITAL_BASED_OUTPATIENT_CLINIC_OR_DEPARTMENT_OTHER): Payer: Self-pay

## 2023-08-23 ENCOUNTER — Encounter: Payer: Self-pay | Admitting: Family

## 2023-08-23 MED ORDER — PROMETHAZINE HCL 25 MG PO TABS
25.0000 mg | ORAL_TABLET | Freq: Three times a day (TID) | ORAL | 2 refills | Status: DC | PRN
  Filled 2023-08-23: qty 20, 7d supply, fill #0
  Filled 2023-12-25: qty 20, 7d supply, fill #1
  Filled 2024-01-22: qty 20, 7d supply, fill #2

## 2023-08-23 NOTE — Telephone Encounter (Signed)
 Done

## 2023-08-28 ENCOUNTER — Other Ambulatory Visit (HOSPITAL_BASED_OUTPATIENT_CLINIC_OR_DEPARTMENT_OTHER): Payer: Self-pay

## 2023-08-28 ENCOUNTER — Other Ambulatory Visit: Payer: Self-pay | Admitting: Hematology & Oncology

## 2023-08-28 DIAGNOSIS — C163 Malignant neoplasm of pyloric antrum: Secondary | ICD-10-CM

## 2023-08-28 DIAGNOSIS — C169 Malignant neoplasm of stomach, unspecified: Secondary | ICD-10-CM

## 2023-08-28 MED ORDER — HYDROCODONE-ACETAMINOPHEN 5-325 MG PO TABS
1.0000 | ORAL_TABLET | Freq: Four times a day (QID) | ORAL | 0 refills | Status: DC | PRN
  Filled 2023-08-28: qty 60, 15d supply, fill #0

## 2023-08-29 ENCOUNTER — Other Ambulatory Visit (HOSPITAL_BASED_OUTPATIENT_CLINIC_OR_DEPARTMENT_OTHER): Payer: Self-pay

## 2023-09-28 ENCOUNTER — Other Ambulatory Visit: Payer: Self-pay

## 2023-09-28 ENCOUNTER — Other Ambulatory Visit (HOSPITAL_BASED_OUTPATIENT_CLINIC_OR_DEPARTMENT_OTHER): Payer: Self-pay

## 2023-09-28 DIAGNOSIS — C169 Malignant neoplasm of stomach, unspecified: Secondary | ICD-10-CM

## 2023-09-28 DIAGNOSIS — C163 Malignant neoplasm of pyloric antrum: Secondary | ICD-10-CM

## 2023-09-28 MED ORDER — HYDROCODONE-ACETAMINOPHEN 5-325 MG PO TABS
1.0000 | ORAL_TABLET | Freq: Four times a day (QID) | ORAL | 0 refills | Status: DC | PRN
  Filled 2023-09-28: qty 60, 15d supply, fill #0

## 2023-10-18 ENCOUNTER — Inpatient Hospital Stay: Attending: Hematology & Oncology

## 2023-10-18 DIAGNOSIS — Z452 Encounter for adjustment and management of vascular access device: Secondary | ICD-10-CM | POA: Insufficient documentation

## 2023-10-18 DIAGNOSIS — D509 Iron deficiency anemia, unspecified: Secondary | ICD-10-CM | POA: Insufficient documentation

## 2023-10-18 NOTE — Patient Instructions (Signed)

## 2023-10-20 ENCOUNTER — Ambulatory Visit
Admission: EM | Admit: 2023-10-20 | Discharge: 2023-10-20 | Disposition: A | Discharge status: Home / Self Care | Attending: Family Medicine | Admitting: Family Medicine

## 2023-10-20 ENCOUNTER — Encounter: Payer: Self-pay | Admitting: Emergency Medicine

## 2023-10-20 DIAGNOSIS — H1032 Unspecified acute conjunctivitis, left eye: Secondary | ICD-10-CM

## 2023-10-20 DIAGNOSIS — H6993 Unspecified Eustachian tube disorder, bilateral: Secondary | ICD-10-CM | POA: Diagnosis not present

## 2023-10-20 DIAGNOSIS — Z1152 Encounter for screening for COVID-19: Secondary | ICD-10-CM

## 2023-10-20 DIAGNOSIS — R1111 Vomiting without nausea: Secondary | ICD-10-CM | POA: Diagnosis not present

## 2023-10-20 LAB — POC SOFIA SARS ANTIGEN FIA: SARS Coronavirus 2 Ag: NEGATIVE

## 2023-10-20 MED ORDER — MOXIFLOXACIN HCL 0.5 % OP SOLN: 1.0000 [drp] | Freq: Three times a day (TID) | OPHTHALMIC | 0 refills | Status: AC

## 2023-10-20 NOTE — ED Triage Notes (Signed)
 Pt states she threw up yesterday and has ear fullness. She would like to be tested for covid.  Pt also has redness in left eye today

## 2023-10-20 NOTE — Discharge Instructions (Signed)
 You were seen today for various symptoms  Your covid swab was negative today.  Your ears appear to have fluid behind them, causing your ear symptoms. Please use over the counter claritin/zyrtec for this.  At this time I do not see evidence of bacterial pink eye.  This is likely viral/allergy.  I recommend you trial over the counter Zaditor for your symptoms.  However, I have sent out an antibiotic eye drop to have in case your eye symptoms worsen.  If you develop worsening redness or yellow/green drainage then you may start the eye drop.  Please return if worsening or not improving.

## 2023-10-20 NOTE — ED Provider Notes (Signed)
 [facility]    CSN: [id number] Arrival date & time: 10/20/23  1302      History   Chief Complaint Chief Complaint  Patient presents with   Cough   Emesis   Conjunctivitis    HPI Paula [name] is a 58 y.o. female.    Cough Associated symptoms: rhinorrhea   Emesis Associated symptoms: cough   Conjunctivitis  She is here for URI symptoms.  She has had vomiting the last three nights.  This is not totally abnormal for her, but wants to make sure she does not have covid.  Having ear fullness.  Mild runny nose.  No fevers/chills.  She noted her left eye was a bit pink.  No drainage, but slight matting.  Slightly uncomfortable, but not itchy.       Past Medical History:  Diagnosis Date   Anxiety    per pt   Arthritis    back   Barrett esophagus    Costochondritis    Esophageal reflux    On omeprazole    Family history of cancer    Headache(784.0) 2011   Migraines   Heart murmur    never caused any problems   History of hiatal hernia    History of radiation therapy 05/12/14- 06/17/14   post-op gastric region/ nodes/ 45 Gy in 25 fractions.    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   Hypoalbuminemia 12/18/2013   Malfunction of device 12/18/2013   Leg edema 12/18/2013   Neoplasm related pain 12/18/2013   Other pancytopenia (HCC) 12/13/2013   Hyponatremia 12/12/2013   Hypokalemia 12/12/2013   Weight loss 12/04/2013   Abnormal loss of weight 12/04/2013   Family history of cancer    Cancer of antrum of stomach (HCC) 11/07/2013   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   Somatization disorder 11/05/2013   Palliative care encounter 11/04/2013   Acute kidney injury (HCC) 11/03/2013   Chronic low back pain 10/31/2013   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   Constipation, chronic 10/28/2013   Protein-calorie malnutrition, severe (HCC) 10/23/2013   Intractable nausea and vomiting 10/22/2013   Intractable vomiting 10/22/2013   Gastric carcinoma s/p Distal gastrectomy/GJ (Billroth II) 09/20/2013 09/18/2013   Carcinoma of stomach (HCC) 09/18/2013   Antral ulcer 09/16/2013   Migraine headache 09/14/2013   PTSD (post-traumatic stress disorder) 08/19/2013   Left knee injury 07/30/2013   Mitral regurgitation 02/15/2013   Chest pain 01/23/2012   Essential hypertension 01/23/2012   Murmur 01/23/2012    Past Surgical History:  Procedure Laterality Date   CESAREAN SECTION  x 2   ESOPHAGOGASTRODUODENOSCOPY N/A 09/16/2013   Procedure: ESOPHAGOGASTRODUODENOSCOPY (EGD);  Surgeon: Lesta JULIANNA Fitz, MD;  Location: [facility];  Service: Endoscopy;  Laterality: N/A;   ESOPHAGOGASTRODUODENOSCOPY  12/09/2019   [facility]   GASTROSTOMY     r/t stomach cancer   IR CV LINE INJECTION  08/30/2017   IR FLUORO GUIDE PORT INSERTION RIGHT  03/21/2017   IR US  GUIDE VASC ACCESS RIGHT  03/21/2017   LAPAROSCOPY N/A 09/20/2013   Procedure: DIAGNOSTIC LAPAROSCOPY,DISTAL GASTRECTOMY AND FEEDING GASTROJEJUNOSTOMY;  Surgeon: Jina Nephew, MD;  Location: [facility];  Service: General;  Laterality: N/A;   OOPHORECTOMY     Around 1985 left ovary removal    PORTACATH PLACEMENT Left 11/18/2013   Procedure: INSERTION PORT-A-CATH;  Surgeon: Jina Nephew, MD;  Location: [facility];  Service: General;  Laterality: Left;   TONSILLECTOMY     Around 1994   TUBAL LIGATION     UNILATERAL SALPINGECTOMY Left 11/09/2017   Procedure: UNILATERAL SALPINGECTOMY;  Surgeon: Herchel Gloris LABOR, MD;  Location: [facility];  Service: Gynecology;  Laterality: Left;   UPPER GASTROINTESTINAL ENDOSCOPY     VAGINAL HYSTERECTOMY Left 11/09/2017   Procedure: HYSTERECTOMY VAGINAL WITH LEFT SALPINGECTOMY;  Surgeon: Herchel Gloris LABOR, MD;  Location: [facility];  Service: Gynecology;  Laterality: Left;    OB History     Gravida  2   Para  2   Term  1   Preterm  1   AB      Living  2      SAB      IAB      Ectopic      Multiple      Live Births  2            Home Medications    Prior to Admission medications   Medication Sig Start Date End Date Taking? Authorizing Provider  sucralfate  (CARAFATE ) 1 GM/10ML suspension Take 10 mLs (1 g total) by mouth QID. Take 1 gram daily 4 times a day for 2 weeks. 08/02/23   Charlanne Groom, MD  fluconazole  (DIFLUCAN ) 200 MG tablet Take 400 mg once, then 200 mg daily for 2 weeks Patient not taking: Reported on 10/20/2023 02/20/23   Charlanne Groom, MD  fluticasone  (FLONASE ) 50 MCG/ACT nasal spray Place 1 spray into both nostrils daily. 12/10/18   Khatri, Hina, PA-C  gabapentin  (NEURONTIN ) 600 MG tablet Take 1 tablet (600 mg total) by mouth 2 (two) times daily. 05/03/23 08/31/23  Anyanwu, Ugonna A, MD  HYDROcodone -acetaminophen  (NORCO/VICODIN) 5-325 MG tablet Take 1 tablet by mouth every 6 (six) hours as needed for moderate pain (pain score 4-6). 09/28/23   Timmy Maude SAUNDERS, MD  lidocaine -prilocaine  (EMLA ) cream Apply topically as needed. 12/22/22   Timmy Maude SAUNDERS, MD  Multiple Vitamins-Minerals (WOMENS MULTIVITAMIN PO) Take by mouth.    [provider]  nitroGLYCERIN  (NITROSTAT ) 0.4 MG SL tablet Place 1 tablet (0.4 mg total)  under the tongue every 5 (five) minutes as needed for chest pain. Patient not taking: No sig reported 12/20/18 05/15/19  Jerilynn Lamarr HERO, NP  pantoprazole  (PROTONIX ) 40 MG tablet Take 1 tablet (40 mg total) by mouth 2 (two) times daily. 02/16/23   Beather Delon Gibson, PA  promethazine  (PHENERGAN ) 25 MG tablet TAKE 1 TABLET(25 MG) BY MOUTH TWICE DAILY 08/09/23   Timmy Maude SAUNDERS, MD  promethazine  (PHENERGAN ) 25 MG tablet Take 1 tablet (25 mg total) by mouth every 8 (eight) hours as needed for nausea or vomiting. 08/23/23  Charlanne Groom, MD  sucralfate  (CARAFATE ) 1 GM/10ML suspension Take 10 mLs (1 g total) by mouth 4 (four) times daily. 05/06/22   Beather Delon Gibson, PA    Family History Family History  Problem Relation Age of Onset   Hypertension Mother    Diabetes Mellitus II Mother    Other Mother        hx of hysterectomy for unspecified reason   Hypertension Sister    Hypertension Brother    Heart failure Maternal Grandmother    Heart Problems Maternal Grandmother    Hypertension Maternal Grandfather    Diabetes Maternal Grandfather    Colon cancer Paternal Uncle        dx. late 50s-early 60s   Gastric cancer Cousin 59   Colon cancer Cousin        dx. early-late 66s; (father also had colon cancer)   Colon cancer Paternal Uncle        dx. late 50s-early 60s   Lung cancer Paternal Uncle        dx. 78s; smoker   Colon polyps Paternal Uncle        unknown number   Heart attack Maternal Aunt    Heart Problems Paternal Aunt    Heart attack Paternal Grandfather    Colon cancer Paternal Grandfather        dx. 16s-60s   Colon polyps Brother        1 maternal half-brother had approx 3 polyps   Breast cancer Cousin 4   Breast cancer Cousin        dx. early 64s or earlier (father had colon cancer)   Lung cancer Other        PGF's brothers (x4); dx. later in life; lim info   Esophageal cancer Neg Hx    Rectal cancer Neg Hx     Social History Social History   Tobacco  Use   Smoking status: Former    Current packs/day: 0.00    Average packs/day: 1 pack/day for 16.0 years (16.0 ttl pk-yrs)    Types: Cigarettes    Start date: 02/21/1982    Quit date: 02/21/1998    Years since quitting: 25.6   Smokeless tobacco: Never   Tobacco comments:    0.5-1.0 ppd for 16 yrs  Vaping Use   Vaping status: Never Used  Substance Use Topics   Alcohol use: Not Currently    Comment: rare use- Holidays   Drug use: Not Currently     Allergies   Tape   Review of Systems Review of Systems  Constitutional: Negative.   HENT:  Positive for rhinorrhea.   Respiratory:  Positive for cough.   Gastrointestinal:  Positive for vomiting.  Genitourinary: Negative.   Musculoskeletal: Negative.   Psychiatric/Behavioral: Negative.       Physical Exam Triage Vital Signs ED Triage Vitals  Encounter Vitals Group     BP 10/20/23 1313 124/77     Girls Systolic BP Percentile --      Girls Diastolic BP Percentile --      Boys Systolic BP Percentile --      Boys Diastolic BP Percentile --      Pulse Rate 10/20/23 1313 72     Resp 10/20/23 1313 16     Temp 10/20/23 1313 98.2 F (36.8 C)     Temp Source 10/20/23 1313 Oral     SpO2 10/20/23 1313 98 %     Weight --      Height --  Head Circumference --      Peak Flow --      Pain Score 10/20/23 1316 0     Pain Loc --      Pain Education --      Exclude from Growth Chart --    No data found.  Updated Vital Signs BP 124/77 (BP Location: Right Arm)   Pulse 72   Temp 98.2 F (36.8 C) (Oral)   Resp 16   LMP 02/28/2016 (Exact Date)   SpO2 98%   Visual Acuity Right Eye Distance:   Left Eye Distance:   Bilateral Distance:    Right Eye Near:   Left Eye Near:    Bilateral Near:     Physical Exam Constitutional:      General: She is not in acute distress.    Appearance: Normal appearance. She is normal weight. She is not ill-appearing or toxic-appearing.  HENT:     Right Ear: A middle ear effusion is present.      Left Ear: A middle ear effusion is present.     Nose: Congestion present.     Mouth/Throat:     Mouth: Mucous membranes are moist.  Eyes:     General: Lids are normal.     Extraocular Movements: Extraocular movements intact.     Conjunctiva/sclera: Conjunctivae normal.  Cardiovascular:     Rate and Rhythm: Normal rate and regular rhythm.  Pulmonary:     Effort: Pulmonary effort is normal.     Breath sounds: Normal breath sounds.  Musculoskeletal:     Cervical back: Normal range of motion and neck supple. Tenderness present.  Lymphadenopathy:     Cervical: No cervical adenopathy.  Neurological:     General: No focal deficit present.     Mental Status: She is alert.  Psychiatric:        Mood and Affect: Mood normal.      UC Treatments / Results  Labs (all labs ordered are listed, but only abnormal results are displayed) Labs Reviewed  POC SOFIA SARS ANTIGEN FIA    EKG   Radiology No results found.  Procedures Procedures (including critical care time)  Medications Ordered in UC Medications - No data to display  Initial Impression / Assessment and Plan / UC Course  I have reviewed the triage vital signs and the nursing notes.  Pertinent labs & imaging results that were available during my care of the patient were reviewed by me and considered in my medical decision making (see chart for details).   Final Clinical Impressions(s) / UC Diagnoses   Final diagnoses:  Encounter for screening for COVID-19  Vomiting without nausea, unspecified vomiting type  Eustachian tube dysfunction, bilateral  Acute conjunctivitis of left eye, unspecified acute conjunctivitis type     Discharge Instructions      You were seen today for various symptoms  Your covid swab was negative today.  Your ears appear to have fluid behind them, causing your ear symptoms. Please use over the counter claritin/zyrtec for this.  At this time I do not see evidence of bacterial pink eye.   This is likely viral/allergy.  I recommend you trial over the counter Zaditor for your symptoms.  However, I have sent out an antibiotic eye drop to have in case your eye symptoms worsen.  If you develop worsening redness or yellow/green drainage then you may start the eye drop.  Please return if worsening or not improving.     ED Prescriptions  Medication Sig Dispense Auth. Provider   moxifloxacin  (VIGAMOX ) 0.5 % ophthalmic solution Place 1 drop into the left eye 3 (three) times daily for 7 days. 3 mL Darral Longs, MD      PDMP not reviewed this encounter.   Darral Longs, MD 10/20/23 [phone]

## 2023-10-30 ENCOUNTER — Other Ambulatory Visit (HOSPITAL_BASED_OUTPATIENT_CLINIC_OR_DEPARTMENT_OTHER): Payer: Self-pay

## 2023-10-30 ENCOUNTER — Other Ambulatory Visit: Payer: Self-pay | Admitting: *Deleted

## 2023-10-30 DIAGNOSIS — C163 Malignant neoplasm of pyloric antrum: Secondary | ICD-10-CM

## 2023-10-30 DIAGNOSIS — C169 Malignant neoplasm of stomach, unspecified: Secondary | ICD-10-CM

## 2023-10-30 MED ORDER — HYDROCODONE-ACETAMINOPHEN 5-325 MG PO TABS
1.0000 | ORAL_TABLET | Freq: Four times a day (QID) | ORAL | 0 refills | Status: DC | PRN
  Filled 2023-10-30: qty 60, 15d supply, fill #0

## 2023-11-01 ENCOUNTER — Inpatient Hospital Stay: Admitting: Family

## 2023-11-01 ENCOUNTER — Inpatient Hospital Stay

## 2023-11-01 ENCOUNTER — Inpatient Hospital Stay: Attending: Hematology & Oncology

## 2023-11-01 ENCOUNTER — Inpatient Hospital Stay (HOSPITAL_BASED_OUTPATIENT_CLINIC_OR_DEPARTMENT_OTHER): Admitting: Family

## 2023-11-01 VITALS — BP 111/69 | HR 76 | Temp 97.9°F | Resp 17 | Wt 136.8 lb

## 2023-11-01 DIAGNOSIS — Z85028 Personal history of other malignant neoplasm of stomach: Secondary | ICD-10-CM | POA: Diagnosis not present

## 2023-11-01 DIAGNOSIS — D509 Iron deficiency anemia, unspecified: Secondary | ICD-10-CM | POA: Diagnosis present

## 2023-11-01 DIAGNOSIS — Z79899 Other long term (current) drug therapy: Secondary | ICD-10-CM | POA: Insufficient documentation

## 2023-11-01 DIAGNOSIS — Z9221 Personal history of antineoplastic chemotherapy: Secondary | ICD-10-CM | POA: Diagnosis not present

## 2023-11-01 DIAGNOSIS — Z95828 Presence of other vascular implants and grafts: Secondary | ICD-10-CM

## 2023-11-01 DIAGNOSIS — C169 Malignant neoplasm of stomach, unspecified: Secondary | ICD-10-CM

## 2023-11-01 DIAGNOSIS — Z923 Personal history of irradiation: Secondary | ICD-10-CM | POA: Diagnosis not present

## 2023-11-01 DIAGNOSIS — Z862 Personal history of diseases of the blood and blood-forming organs and certain disorders involving the immune mechanism: Secondary | ICD-10-CM | POA: Diagnosis not present

## 2023-11-01 DIAGNOSIS — C163 Malignant neoplasm of pyloric antrum: Secondary | ICD-10-CM

## 2023-11-01 DIAGNOSIS — D500 Iron deficiency anemia secondary to blood loss (chronic): Secondary | ICD-10-CM | POA: Diagnosis not present

## 2023-11-01 LAB — CBC WITH DIFFERENTIAL (CANCER CENTER ONLY)
Abs Immature Granulocytes: 0.03 K/uL (ref 0.00–0.07)
Basophils Absolute: 0 K/uL (ref 0.0–0.1)
Basophils Relative: 0 %
Eosinophils Absolute: 0.1 K/uL (ref 0.0–0.5)
Eosinophils Relative: 2 %
HCT: 36.8 % (ref 36.0–46.0)
Hemoglobin: 11.3 g/dL — ABNORMAL LOW (ref 12.0–15.0)
Immature Granulocytes: 1 %
Lymphocytes Relative: 45 %
Lymphs Abs: 2.2 K/uL (ref 0.7–4.0)
MCH: 27.4 pg (ref 26.0–34.0)
MCHC: 30.7 g/dL (ref 30.0–36.0)
MCV: 89.1 fL (ref 80.0–100.0)
Monocytes Absolute: 0.3 K/uL (ref 0.1–1.0)
Monocytes Relative: 6 %
Neutro Abs: 2.3 K/uL (ref 1.7–7.7)
Neutrophils Relative %: 46 %
Platelet Count: 237 K/uL (ref 150–400)
RBC: 4.13 MIL/uL (ref 3.87–5.11)
RDW: 14 % (ref 11.5–15.5)
Smear Review: NORMAL
WBC Count: 5 K/uL (ref 4.0–10.5)
nRBC: 0 % (ref 0.0–0.2)

## 2023-11-01 LAB — RETIC PANEL
Immature Retic Fract: 11.3 % (ref 2.3–15.9)
RBC.: 3.75 MIL/uL — ABNORMAL LOW (ref 3.87–5.11)
Retic Count, Absolute: 45 K/uL (ref 19.0–186.0)
Retic Ct Pct: 1.2 % (ref 0.4–3.1)
Reticulocyte Hemoglobin: 29.8 pg (ref >27.9)

## 2023-11-01 LAB — CMP (CANCER CENTER ONLY)
ALT: 20 U/L (ref 0–44)
AST: 28 U/L (ref 15–41)
Albumin: 4.4 g/dL (ref 3.5–5.0)
Alkaline Phosphatase: 77 U/L (ref 38–126)
Anion gap: 10 (ref 5–15)
BUN: 15 mg/dL (ref 6–20)
CO2: 27 mmol/L (ref 22–32)
Calcium: 9.5 mg/dL (ref 8.9–10.3)
Chloride: 102 mmol/L (ref 98–111)
Creatinine: 1 mg/dL (ref 0.44–1.00)
GFR, Estimated: >60 mL/min (ref >60)
Glucose, Bld: 99 mg/dL (ref 70–99)
Potassium: 4.2 mmol/L (ref 3.5–5.1)
Sodium: 139 mmol/L (ref 135–145)
Total Bilirubin: 0.3 mg/dL (ref 0.0–1.2)
Total Protein: 7.8 g/dL (ref 6.5–8.1)

## 2023-11-01 LAB — IRON AND IRON BINDING CAPACITY (CC-WL,HP ONLY)
Iron: 36 ug/dL (ref 28–170)
Saturation Ratios: 12 % (ref 10.4–31.8)
TIBC: 312 ug/dL (ref 250–450)
UIBC: 276 ug/dL

## 2023-11-01 LAB — VITAMIN B12: Vitamin B-12: 1082 pg/mL — ABNORMAL HIGH (ref 180–914)

## 2023-11-01 LAB — FERRITIN: Ferritin: 33 ng/mL (ref 11–307)

## 2023-11-01 LAB — LACTATE DEHYDROGENASE: LDH: 188 U/L (ref 98–192)

## 2023-11-01 MED ORDER — ALTEPLASE 2 MG IJ SOLR
2.0000 mg | Freq: Once | INTRAMUSCULAR | Status: AC
  Administered 2023-11-01: 2 mg
  Filled 2023-11-01: qty 2

## 2023-11-01 NOTE — Progress Notes (Signed)
 1314 Port Accessed, flushes easily. No blood return noted. No swelling, No pain, No redness or discomfort noted at the port site. Alteplase  administered.

## 2023-11-01 NOTE — Progress Notes (Signed)
 Hematology and Oncology Follow Up Visit  Paula [name] [id number] [birth year] 58 y.o. 11/01/2023   Principle Diagnosis:  Stage IIA (T1a, N2, M0) gastric adenocarcinoma diagnosed in July of 2015 Intermittent iron  deficiency anemia   Past Therapy: Status post diagnostic laparoscopy, distal gastrectomy with billroth II anastamosis and feeding gastrojejunostomy tube under the care of Dr. Aron on 09/20/2013 Adjuvant systemic chemotherapy with 5-FU 600 mg/M2 with leucovorin  on days 1, 8, 15, 22, 29 and 36 every 8 weeks Adjuvant concurrent chemoradiation with Xeloda  650 mg/M2 by mouth twice a day for 5 days every week with radiation. The patient completed the course of radiotherapy but she declined to take Xeloda  during her radiation   Current Therapy:        Observation IV iron  as indicated   Interim History:  Paula [name] is here today for follow-up. She is doing fairly well. She has had some n/v and found that she just can not tolerate red meat. She is eating only fish and plans to increase her intake of greens as well. Hydration is good. Weight is stable at 136 lbs.  She has occasional dizziness.  No fever, chills, cough, rash, SOB, chest pain, palpitations, abdominal pain or changes in bowel or bladder habits.  She has not noted any blood loss. No bruising or petechiae.  No swelling, tenderness, numbness or tingling in her extremities at this time.  No falls or syncope.  ECOG Performance Status: 1 - Symptomatic but completely ambulatory  Medications:  Allergies as of 11/01/2023       Reactions   Tape Itching   Ok with adhesive and paper tape         Medication List        Accurate as of November 01, 2023  1:49 PM. If you have any questions, ask your nurse or doctor.          fluconazole  200 MG tablet Commonly known as: Diflucan  Take 400 mg once, then 200 mg daily for 2 weeks   fluticasone  50 MCG/ACT nasal spray Commonly known as: FLONASE  Place 1 spray  into both nostrils daily.   gabapentin  600 MG tablet Commonly known as: NEURONTIN  Take 1 tablet (600 mg total) by mouth 2 (two) times daily.   HYDROcodone -acetaminophen  5-325 MG tablet Commonly known as: NORCO/VICODIN Take 1 tablet by mouth every 6 (six) hours as needed for moderate pain (pain score 4-6).   lidocaine -prilocaine  cream Commonly known as: EMLA  Apply topically as needed.   nitroGLYCERIN  0.4 MG SL tablet Commonly known as: NITROSTAT  Place 1 tablet (0.4 mg total) under the tongue every 5 (five) minutes as needed for chest pain.   pantoprazole  40 MG tablet Commonly known as: PROTONIX  Take 1 tablet (40 mg total) by mouth 2 (two) times daily.   promethazine  25 MG tablet Commonly known as: PHENERGAN  TAKE 1 TABLET(25 MG) BY MOUTH TWICE DAILY   promethazine  25 MG tablet Commonly known as: PHENERGAN  Take 1 tablet (25 mg total) by mouth every 8 (eight) hours as needed for nausea or vomiting.   sucralfate  1 GM/10ML suspension Commonly known as: CARAFATE  Take 10 mLs (1 g total) by mouth 4 (four) times daily.   sucralfate  1 GM/10ML suspension Commonly known as: CARAFATE  Take 10 mLs (1 g total) by mouth QID. Take 1 gram daily 4 times a day for 2 weeks.   WOMENS MULTIVITAMIN PO Take by mouth.        Allergies:  Allergies  Allergen Reactions   Tape Itching  Ok with adhesive and paper tape     Past Medical History, Surgical history, Social history, and Family History were reviewed and updated.  Review of Systems: All other 10 point review of systems is negative.   Physical Exam:  weight is 136 lb 12.8 oz (62.1 kg). Her oral temperature is 97.9 F (36.6 C). Her blood pressure is 111/69 and her pulse is 76. Her respiration is 17 and oxygen saturation is 100%.   Wt Readings from Last 3 Encounters:  11/01/23 136 lb 12.8 oz (62.1 kg)  04/25/23 128 lb (58.1 kg)  02/20/23 127 lb (57.6 kg)    Ocular: Sclerae unicteric, pupils equal, round and reactive to  light Ear-nose-throat: Oropharynx clear, dentition fair Lymphatic: No cervical or supraclavicular adenopathy Lungs no rales or rhonchi, good excursion bilaterally Heart regular rate and rhythm, no murmur appreciated Abd soft, nontender, positive bowel sounds MSK no focal spinal tenderness, no joint edema Neuro: non-focal, well-oriented, appropriate affect Breasts: Deferred   Lab Results  Component Value Date   WBC 5.1 04/25/2023   HGB 10.4 (L) 04/25/2023   HCT 32.9 (L) 04/25/2023   MCV 90.9 04/25/2023   PLT 295 04/25/2023   Lab Results  Component Value Date   FERRITIN 83 04/25/2023   IRON  51 04/25/2023   TIBC 287 04/25/2023   UIBC 236 04/25/2023   IRONPCTSAT 18 04/25/2023   Lab Results  Component Value Date   RETICCTPCT 1.6 04/25/2023   RBC 3.62 (L) 04/25/2023   No results found for: JONATHAN BONG [facility] Lab Results  Component Value Date   IGGSERUM 1,273 05/21/2015   IGMSERUM 40 05/21/2015   No results found for: STEPHANY CARLOTA BENSON MARKEL EARLA JOANNIE DOC VICK, SPEI   Chemistry      Component Value Date/Time   NA 137 04/25/2023 1325   NA 140 12/30/2020 1506   NA 140 01/26/2017 1002   NA 140 09/24/2015 0953   K 4.4 04/25/2023 1325   K 3.5 01/26/2017 1002   K 4.0 09/24/2015 0953   CL 101 04/25/2023 1325   CL 106 01/26/2017 1002   CO2 31 04/25/2023 1325   CO2 26 01/26/2017 1002   CO2 23 09/24/2015 0953   BUN 16 04/25/2023 1325   BUN 14 12/30/2020 1506   BUN 6 (L) 01/26/2017 1002   BUN 8.6 09/24/2015 0953   CREATININE 1.09 (H) 04/25/2023 1325   CREATININE 1.1 01/26/2017 1002   CREATININE 1.0 09/24/2015 0953      Component Value Date/Time   CALCIUM  9.5 04/25/2023 1325   CALCIUM  8.9 01/26/2017 1002   CALCIUM  9.6 09/24/2015 0953   ALKPHOS 64 04/25/2023 1325   ALKPHOS 68 01/26/2017 1002   ALKPHOS 99 09/24/2015 0953   AST 15 04/25/2023 1325   AST 17 09/24/2015 0953   ALT 10 04/25/2023 1325   ALT  17 01/26/2017 1002   ALT 11 09/24/2015 0953   BILITOT 0.4 04/25/2023 1325   BILITOT 0.83 09/24/2015 0953       Impression and Plan: Paula [name] is a pleasant 58 yo African American female with history of stage II gastric cancer and partial gastrectomy followed by adjuvant chemo and radiation. Iron  studies and B 12 level are pending. We will replace if needed.  Port flush every 2 month, follow-up in 6 months.    Lauraine Pepper, NP 9/10/20251:49 PM

## 2023-11-01 NOTE — Progress Notes (Signed)
 1345-No blood return noted from port.  Alteplase  removed, port flushed and HN removed with no difficulty.  Labs drawn peripherally.

## 2023-11-01 NOTE — Patient Instructions (Signed)

## 2023-11-03 ENCOUNTER — Ambulatory Visit: Payer: Self-pay | Admitting: Family

## 2023-11-10 NOTE — Unmapped External Note (Signed)
 16109   Self-Care for Low Back Pain   Most people have low back pain now and then. In many cases, it isn?t serious, and self-care can help. Sometimes low back pain can be a sign of a bigger problem. Call your healthcare provider if your pain returns often or gets worse over time. There are plenty of ways to take care of your back. Get regular exercise, lose any excess weight, and practice good posture.      Take a short rest   Lying down during the day may be helpful for short periods of time if pain worsens with sitting or standing. It may help to have a pillow under the knees when lying on your back. But, keep in mind, long-term bed rest could be damaging.       Reduce pain and swelling   Cold reduces swelling. Both cold and heat can reduce pain. Remember to protect your skin by placing a towel between your body and the ice or heat source.   ?  For the first few days, apply an ice pack for 15 to 20 minutes, several times a day. To make a cold pack, put ice cubes in a plastic bag that seals at the top. A wrapped frozen bag of vegetables can also work as a cold pack.   ?  After the first few days, try heat for 15 minutes at a time to ease pain. Always make sure the heating pad is wrapped. Never sleep on a heating pad.   ?  Over-the-counter medicine can help control pain and swelling. Try aspirin or a nonsteroidal anti-inflammatory drugs (NSAIDs) such as ibuprofen.       Exercise   Exercise can help your back heal. It also helps your back get stronger and more flexible, preventing any reinjury. Ask your healthcare provider about specific exercises for your back.       Use good posture to avoid reinjury   ?  When moving, bend at the hips and knees. Don?t bend at the waist or twist around.   ?  When lifting, keep the object close to your body. Lift heavy items using your legs, not your back. Don?t try to lift more than you can handle.   ?  When sitting, keep your lower back supported. Use a rolled-up towel as needed. Make sure your work area or desk is at the correct height.   ?  Use a mirror to check your posture when you walk. Stand straight with shoulders back. Ask your healthcare providers for exercises that will improve your posture.       When to seek medical care   Seek medical care right away if:   ?  You can't stand or walk   ?  You have a temperature over 100.4?F ( 38.0?C), or as advised by your healthcare provider   ?  You have frequent, painful, or bloody urination   ?  You have severe abdominal pain   ?  You have a sharp, stabbing pain   ?  Your pain is constant   ?  You have pain, tingling, or numbness in your leg   ?  You have weakness in one or both legs or problems with bladder, bowel, or sexual function. These symptoms should be seen by a healthcare provider right away. This is because they can be caused by compression of the nerve bundle at the base of the spine.   ?  You feel pain in a new  area of your back   ?  You notice that the pain isn?t decreasing after more than a week   ?  Your symptoms worsen or new symptoms develop     Last Reviewed Date: 05/23/2022 00:00:00   ? 2000-2025 [employer], [place]. All rights reserved. This information is not intended as a substitute for professional medical care. Always follow your healthcare professional's instructions.
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
## 2023-11-22 ENCOUNTER — Telehealth: Payer: Self-pay | Admitting: Gastroenterology

## 2023-11-22 ENCOUNTER — Telehealth: Payer: Self-pay

## 2023-11-22 MED ORDER — PROMETHAZINE HCL 25 MG PO TABS: 25.0000 mg | ORAL_TABLET | Freq: Three times a day (TID) | ORAL | 2 refills | Status: AC | PRN

## 2023-11-22 NOTE — Telephone Encounter (Signed)
 PT is calling to find out if Dr. Charlanne will be sending in an inhaler for her. Please advise.

## 2023-11-22 NOTE — Telephone Encounter (Signed)
 Patient was advised to go to the ED and to follow up with her PCP since we haven't prescribed an inhaler

## 2023-11-22 NOTE — Telephone Encounter (Signed)
 Received phone call from patient inquiring if this office received the report from her xrays that were done by [facility]. Pt educated that the results were not routed to this office. Pt stating that she is having severe left sided pain around the kidney. Pt states she was recently treated for a UTI and finished a steroid prescription. Pt states that she is unable to rest comfortably and the pain is unbearable. Pt instructed to go to Urgent care or ER to be evaluated if pain is that unbearable. Pt stated that she will go to the ER. Pt had no further questions and appreciative of call.

## 2023-11-22 NOTE — Telephone Encounter (Signed)
 Inbound call from patient stating she's needing medication phenergan  refilled and an inhaler. Patient stated she's gotten it from Dr.Gupta before. [facility] [address] in [place]  Requesting a call back  Please advise  Thank you

## 2023-11-22 NOTE — Telephone Encounter (Signed)
 Error in documenting my note; new encounter made.

## 2023-11-22 NOTE — Telephone Encounter (Signed)
**Note De-identified  [place] Obfuscation** Please advise 

## 2023-11-22 NOTE — Telephone Encounter (Signed)
 LVM for patient and sent in nausea medication

## 2023-11-30 ENCOUNTER — Other Ambulatory Visit (HOSPITAL_BASED_OUTPATIENT_CLINIC_OR_DEPARTMENT_OTHER): Payer: Self-pay

## 2023-11-30 ENCOUNTER — Other Ambulatory Visit: Payer: Self-pay

## 2023-11-30 ENCOUNTER — Other Ambulatory Visit: Payer: Self-pay | Admitting: Hematology & Oncology

## 2023-11-30 DIAGNOSIS — C163 Malignant neoplasm of pyloric antrum: Secondary | ICD-10-CM

## 2023-11-30 DIAGNOSIS — C169 Malignant neoplasm of stomach, unspecified: Secondary | ICD-10-CM

## 2023-11-30 MED ORDER — HYDROCODONE-ACETAMINOPHEN 5-325 MG PO TABS
1.0000 | ORAL_TABLET | Freq: Four times a day (QID) | ORAL | 0 refills | Status: DC | PRN
  Filled 2023-11-30: qty 60, 15d supply, fill #0

## 2023-12-11 ENCOUNTER — Other Ambulatory Visit (HOSPITAL_BASED_OUTPATIENT_CLINIC_OR_DEPARTMENT_OTHER): Payer: Self-pay

## 2023-12-11 ENCOUNTER — Encounter: Payer: Self-pay | Admitting: Obstetrics & Gynecology

## 2023-12-11 DIAGNOSIS — N951 Menopausal and female climacteric states: Secondary | ICD-10-CM

## 2023-12-11 MED ORDER — GABAPENTIN 600 MG PO TABS
600.0000 mg | ORAL_TABLET | Freq: Two times a day (BID) | ORAL | 5 refills | Status: AC
  Filled 2023-12-11 – 2024-03-28 (×5): qty 120, 60d supply, fill #0
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
## 2023-12-14 ENCOUNTER — Other Ambulatory Visit (HOSPITAL_BASED_OUTPATIENT_CLINIC_OR_DEPARTMENT_OTHER): Payer: Self-pay

## 2023-12-18 ENCOUNTER — Other Ambulatory Visit (HOSPITAL_BASED_OUTPATIENT_CLINIC_OR_DEPARTMENT_OTHER): Payer: Self-pay

## 2023-12-19 ENCOUNTER — Other Ambulatory Visit (HOSPITAL_BASED_OUTPATIENT_CLINIC_OR_DEPARTMENT_OTHER): Payer: Self-pay

## 2024-01-01 ENCOUNTER — Other Ambulatory Visit (HOSPITAL_BASED_OUTPATIENT_CLINIC_OR_DEPARTMENT_OTHER): Payer: Self-pay

## 2024-01-01 ENCOUNTER — Other Ambulatory Visit: Payer: Self-pay | Admitting: Hematology & Oncology

## 2024-01-01 ENCOUNTER — Other Ambulatory Visit: Payer: Self-pay

## 2024-01-01 DIAGNOSIS — C169 Malignant neoplasm of stomach, unspecified: Secondary | ICD-10-CM

## 2024-01-01 DIAGNOSIS — C163 Malignant neoplasm of pyloric antrum: Secondary | ICD-10-CM

## 2024-01-01 MED ORDER — HYDROCODONE-ACETAMINOPHEN 5-325 MG PO TABS
1.0000 | ORAL_TABLET | Freq: Four times a day (QID) | ORAL | 0 refills | Status: DC | PRN
  Filled 2024-01-01: qty 60, 15d supply, fill #0

## 2024-01-03 ENCOUNTER — Inpatient Hospital Stay

## 2024-01-03 ENCOUNTER — Inpatient Hospital Stay: Attending: Hematology & Oncology

## 2024-01-03 DIAGNOSIS — Z85028 Personal history of other malignant neoplasm of stomach: Secondary | ICD-10-CM | POA: Diagnosis not present

## 2024-01-03 DIAGNOSIS — D500 Iron deficiency anemia secondary to blood loss (chronic): Secondary | ICD-10-CM

## 2024-01-03 DIAGNOSIS — Z9221 Personal history of antineoplastic chemotherapy: Secondary | ICD-10-CM | POA: Insufficient documentation

## 2024-01-03 DIAGNOSIS — C163 Malignant neoplasm of pyloric antrum: Secondary | ICD-10-CM

## 2024-01-03 DIAGNOSIS — Z923 Personal history of irradiation: Secondary | ICD-10-CM | POA: Insufficient documentation

## 2024-01-03 DIAGNOSIS — Z862 Personal history of diseases of the blood and blood-forming organs and certain disorders involving the immune mechanism: Secondary | ICD-10-CM

## 2024-01-03 DIAGNOSIS — D509 Iron deficiency anemia, unspecified: Secondary | ICD-10-CM | POA: Insufficient documentation

## 2024-01-03 LAB — IRON AND IRON BINDING CAPACITY (CC-WL,HP ONLY)
Iron: 73 ug/dL (ref 28–170)
Saturation Ratios: 23 % (ref 10.4–31.8)
TIBC: 318 ug/dL (ref 250–450)
UIBC: 245 ug/dL

## 2024-01-03 LAB — CBC WITH DIFFERENTIAL (CANCER CENTER ONLY)
Abs Immature Granulocytes: 0.09 K/uL — ABNORMAL HIGH (ref 0.00–0.07)
Basophils Absolute: 0 K/uL (ref 0.0–0.1)
Basophils Relative: 0 %
Eosinophils Absolute: 0 K/uL (ref 0.0–0.5)
Eosinophils Relative: 0 %
HCT: 35.2 % — ABNORMAL LOW (ref 36.0–46.0)
Hemoglobin: 11.1 g/dL — ABNORMAL LOW (ref 12.0–15.0)
Immature Granulocytes: 1 %
Lymphocytes Relative: 13 %
Lymphs Abs: 1 K/uL (ref 0.7–4.0)
MCH: 27.8 pg (ref 26.0–34.0)
MCHC: 31.5 g/dL (ref 30.0–36.0)
MCV: 88 fL (ref 80.0–100.0)
Monocytes Absolute: 0.3 K/uL (ref 0.1–1.0)
Monocytes Relative: 5 %
Neutro Abs: 6 K/uL (ref 1.7–7.7)
Neutrophils Relative %: 81 %
Platelet Count: 269 K/uL (ref 150–400)
RBC: 4 MIL/uL (ref 3.87–5.11)
RDW: 13.8 % (ref 11.5–15.5)
WBC Count: 7.4 K/uL (ref 4.0–10.5)
nRBC: 0 % (ref 0.0–0.2)

## 2024-01-03 LAB — CMP (CANCER CENTER ONLY)
ALT: 13 U/L (ref 0–44)
AST: 18 U/L (ref 15–41)
Albumin: 4.3 g/dL (ref 3.5–5.0)
Alkaline Phosphatase: 78 U/L (ref 38–126)
Anion gap: 10 (ref 5–15)
BUN: 20 mg/dL (ref 6–20)
CO2: 25 mmol/L (ref 22–32)
Calcium: 8.9 mg/dL (ref 8.9–10.3)
Chloride: 104 mmol/L (ref 98–111)
Creatinine: 0.96 mg/dL (ref 0.44–1.00)
GFR, Estimated: >60 mL/min (ref >60)
Glucose, Bld: 194 mg/dL — ABNORMAL HIGH (ref 70–99)
Potassium: 4.1 mmol/L (ref 3.5–5.1)
Sodium: 140 mmol/L (ref 135–145)
Total Bilirubin: 0.4 mg/dL (ref 0.0–1.2)
Total Protein: 7.1 g/dL (ref 6.5–8.1)

## 2024-01-03 LAB — RETICULOCYTES
Immature Retic Fract: 13.4 % (ref 2.3–15.9)
RBC.: 3.97 MIL/uL (ref 3.87–5.11)
Retic Count, Absolute: 58 K/uL (ref 19.0–186.0)
Retic Ct Pct: 1.5 % (ref 0.4–3.1)

## 2024-01-03 LAB — FERRITIN: Ferritin: 35 ng/mL (ref 11–307)

## 2024-01-03 LAB — VITAMIN B12: Vitamin B-12: 775 pg/mL (ref 180–914)

## 2024-01-03 LAB — LACTATE DEHYDROGENASE: LDH: 154 U/L (ref 105–235)

## 2024-01-09 ENCOUNTER — Ambulatory Visit: Payer: Self-pay | Admitting: *Deleted

## 2024-01-24 ENCOUNTER — Other Ambulatory Visit (HOSPITAL_BASED_OUTPATIENT_CLINIC_OR_DEPARTMENT_OTHER): Payer: Self-pay

## 2024-01-24 MED ORDER — COMIRNATY 30 MCG/0.3ML IM SUSY
0.3000 mL | PREFILLED_SYRINGE | Freq: Once | INTRAMUSCULAR | 0 refills | Status: AC
  Filled 2024-01-24: qty 0.3, 1d supply, fill #0

## 2024-01-31 ENCOUNTER — Other Ambulatory Visit: Payer: Self-pay

## 2024-01-31 ENCOUNTER — Other Ambulatory Visit (HOSPITAL_BASED_OUTPATIENT_CLINIC_OR_DEPARTMENT_OTHER): Payer: Self-pay

## 2024-01-31 DIAGNOSIS — C169 Malignant neoplasm of stomach, unspecified: Secondary | ICD-10-CM

## 2024-01-31 DIAGNOSIS — C163 Malignant neoplasm of pyloric antrum: Secondary | ICD-10-CM

## 2024-01-31 MED ORDER — HYDROCODONE-ACETAMINOPHEN 5-325 MG PO TABS
1.0000 | ORAL_TABLET | Freq: Four times a day (QID) | ORAL | 0 refills | Status: DC | PRN
  Filled 2024-01-31: qty 60, 15d supply, fill #0

## 2024-02-11 ENCOUNTER — Emergency Department (HOSPITAL_BASED_OUTPATIENT_CLINIC_OR_DEPARTMENT_OTHER)
Admission: EM | Admit: 2024-02-11 | Discharge: 2024-02-11 | Discharge status: Against Medical Advice | Attending: Emergency Medicine | Admitting: Emergency Medicine

## 2024-02-11 ENCOUNTER — Encounter (HOSPITAL_BASED_OUTPATIENT_CLINIC_OR_DEPARTMENT_OTHER): Payer: Self-pay

## 2024-02-11 ENCOUNTER — Other Ambulatory Visit: Payer: Self-pay

## 2024-02-11 DIAGNOSIS — T2601XA Burn of right eyelid and periocular area, initial encounter: Secondary | ICD-10-CM | POA: Diagnosis not present

## 2024-02-11 DIAGNOSIS — T2121XA Burn of second degree of chest wall, initial encounter: Secondary | ICD-10-CM | POA: Diagnosis present

## 2024-02-11 DIAGNOSIS — T22211A Burn of second degree of right forearm, initial encounter: Secondary | ICD-10-CM | POA: Insufficient documentation

## 2024-02-11 DIAGNOSIS — X102XXA Contact with fats and cooking oils, initial encounter: Secondary | ICD-10-CM | POA: Diagnosis not present
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
## 2024-02-21 ENCOUNTER — Other Ambulatory Visit: Payer: Self-pay | Admitting: Gastroenterology

## 2024-02-23 ENCOUNTER — Other Ambulatory Visit (HOSPITAL_BASED_OUTPATIENT_CLINIC_OR_DEPARTMENT_OTHER): Payer: Self-pay

## 2024-02-23 MED ORDER — PROMETHAZINE HCL 25 MG PO TABS
25.0000 mg | ORAL_TABLET | Freq: Three times a day (TID) | ORAL | 0 refills | Status: AC | PRN
  Filled 2024-02-23: qty 20, 7d supply, fill #0

## 2024-02-28 ENCOUNTER — Other Ambulatory Visit (HOSPITAL_BASED_OUTPATIENT_CLINIC_OR_DEPARTMENT_OTHER): Payer: Self-pay

## 2024-02-29 ENCOUNTER — Other Ambulatory Visit: Payer: Self-pay

## 2024-02-29 ENCOUNTER — Other Ambulatory Visit: Payer: Self-pay | Admitting: Hematology & Oncology

## 2024-02-29 ENCOUNTER — Other Ambulatory Visit (HOSPITAL_BASED_OUTPATIENT_CLINIC_OR_DEPARTMENT_OTHER): Payer: Self-pay

## 2024-02-29 DIAGNOSIS — C169 Malignant neoplasm of stomach, unspecified: Secondary | ICD-10-CM

## 2024-02-29 DIAGNOSIS — C163 Malignant neoplasm of pyloric antrum: Secondary | ICD-10-CM

## 2024-02-29 MED ORDER — HYDROCODONE-ACETAMINOPHEN 5-325 MG PO TABS
1.0000 | ORAL_TABLET | Freq: Four times a day (QID) | ORAL | 0 refills | Status: DC | PRN
  Filled 2024-02-29 (×2): qty 60, 15d supply, fill #0

## 2024-03-01 ENCOUNTER — Other Ambulatory Visit: Payer: Self-pay

## 2024-03-04 ENCOUNTER — Inpatient Hospital Stay

## 2024-03-11 ENCOUNTER — Inpatient Hospital Stay: Attending: Hematology & Oncology

## 2024-03-11 DIAGNOSIS — D509 Iron deficiency anemia, unspecified: Secondary | ICD-10-CM | POA: Insufficient documentation

## 2024-03-11 DIAGNOSIS — Z452 Encounter for adjustment and management of vascular access device: Secondary | ICD-10-CM | POA: Insufficient documentation

## 2024-03-14 ENCOUNTER — Inpatient Hospital Stay

## 2024-03-15 ENCOUNTER — Other Ambulatory Visit: Payer: Self-pay

## 2024-03-15 ENCOUNTER — Inpatient Hospital Stay

## 2024-03-15 DIAGNOSIS — Z95828 Presence of other vascular implants and grafts: Secondary | ICD-10-CM

## 2024-03-15 DIAGNOSIS — D509 Iron deficiency anemia, unspecified: Secondary | ICD-10-CM | POA: Diagnosis present

## 2024-03-15 DIAGNOSIS — Z452 Encounter for adjustment and management of vascular access device: Secondary | ICD-10-CM | POA: Diagnosis present

## 2024-03-15 MED ORDER — LIDOCAINE-PRILOCAINE 2.5-2.5 % EX CREA: 1.0000 | TOPICAL_CREAM | CUTANEOUS | 6 refills | Status: AC | PRN

## 2024-03-15 NOTE — Patient Instructions (Signed)

## 2024-03-28 ENCOUNTER — Other Ambulatory Visit: Payer: Self-pay | Admitting: Hematology & Oncology

## 2024-03-28 ENCOUNTER — Other Ambulatory Visit: Payer: Self-pay

## 2024-03-28 ENCOUNTER — Other Ambulatory Visit: Payer: Self-pay | Admitting: Gastroenterology

## 2024-03-28 ENCOUNTER — Other Ambulatory Visit (HOSPITAL_BASED_OUTPATIENT_CLINIC_OR_DEPARTMENT_OTHER): Payer: Self-pay

## 2024-03-28 DIAGNOSIS — C169 Malignant neoplasm of stomach, unspecified: Secondary | ICD-10-CM

## 2024-03-28 DIAGNOSIS — C163 Malignant neoplasm of pyloric antrum: Secondary | ICD-10-CM

## 2024-03-28 MED ORDER — HYDROCODONE-ACETAMINOPHEN 5-325 MG PO TABS
1.0000 | ORAL_TABLET | Freq: Four times a day (QID) | ORAL | 0 refills | Status: AC | PRN
  Filled 2024-03-28: qty 60, 15d supply, fill #0

## 2024-05-02 ENCOUNTER — Inpatient Hospital Stay: Admitting: Hematology & Oncology

## 2024-05-02 ENCOUNTER — Inpatient Hospital Stay: Attending: Hematology & Oncology

## 2024-05-02 ENCOUNTER — Inpatient Hospital Stay

## 2024-05-10 ENCOUNTER — Ambulatory Visit: Admitting: Gastroenterology
